# Patient Record
Sex: Female | Born: 1955
Health system: Southern US, Community
[De-identification: ages and names within clinical notes are randomized; demographics above are authoritative.]

## PROBLEM LIST (undated history)

## (undated) DIAGNOSIS — Z923 Personal history of irradiation: Secondary | ICD-10-CM

## (undated) DIAGNOSIS — R42 Dizziness and giddiness: Secondary | ICD-10-CM

## (undated) DIAGNOSIS — M1990 Unspecified osteoarthritis, unspecified site: Secondary | ICD-10-CM

## (undated) DIAGNOSIS — D649 Anemia, unspecified: Secondary | ICD-10-CM

## (undated) DIAGNOSIS — G629 Polyneuropathy, unspecified: Secondary | ICD-10-CM

## (undated) DIAGNOSIS — F419 Anxiety disorder, unspecified: Secondary | ICD-10-CM

## (undated) DIAGNOSIS — D62 Acute posthemorrhagic anemia: Secondary | ICD-10-CM

## (undated) DIAGNOSIS — Z0000 Encounter for general adult medical examination without abnormal findings: Secondary | ICD-10-CM

## (undated) DIAGNOSIS — Z8619 Personal history of other infectious and parasitic diseases: Secondary | ICD-10-CM

## (undated) DIAGNOSIS — I10 Essential (primary) hypertension: Secondary | ICD-10-CM

## (undated) DIAGNOSIS — E785 Hyperlipidemia, unspecified: Secondary | ICD-10-CM

## (undated) DIAGNOSIS — F32A Depression, unspecified: Secondary | ICD-10-CM

## (undated) DIAGNOSIS — C50919 Malignant neoplasm of unspecified site of unspecified female breast: Secondary | ICD-10-CM

## (undated) DIAGNOSIS — R739 Hyperglycemia, unspecified: Secondary | ICD-10-CM

## (undated) DIAGNOSIS — K5900 Constipation, unspecified: Secondary | ICD-10-CM

## (undated) DIAGNOSIS — F329 Major depressive disorder, single episode, unspecified: Secondary | ICD-10-CM

## (undated) DIAGNOSIS — A4902 Methicillin resistant Staphylococcus aureus infection, unspecified site: Secondary | ICD-10-CM

## (undated) HISTORY — DX: Hyperglycemia, unspecified: R73.9

## (undated) HISTORY — DX: Hyperlipidemia, unspecified: E78.5

## (undated) HISTORY — DX: Malignant neoplasm of unspecified site of unspecified female breast: C50.919

## (undated) HISTORY — DX: Essential (primary) hypertension: I10

## (undated) HISTORY — DX: Anxiety disorder, unspecified: F41.9

## (undated) HISTORY — DX: Methicillin resistant Staphylococcus aureus infection, unspecified site: A49.02

## (undated) HISTORY — PX: BREAST SURGERY: SHX581

## (undated) HISTORY — DX: Major depressive disorder, single episode, unspecified: F32.9

## (undated) HISTORY — DX: Dizziness and giddiness: R42

## (undated) HISTORY — DX: Personal history of irradiation: Z92.3

## (undated) HISTORY — DX: Acute posthemorrhagic anemia: D62

## (undated) HISTORY — DX: Constipation, unspecified: K59.00

## (undated) HISTORY — DX: Personal history of other infectious and parasitic diseases: Z86.19

## (undated) HISTORY — DX: Encounter for general adult medical examination without abnormal findings: Z00.00

---

## 1978-11-06 HISTORY — PX: APPENDECTOMY: SHX54

## 2002-06-07 ENCOUNTER — Emergency Department (HOSPITAL_COMMUNITY): Admission: EM | Admit: 2002-06-07 | Discharge: 2002-06-07 | Payer: Self-pay | Admitting: *Deleted

## 2006-04-20 ENCOUNTER — Emergency Department (HOSPITAL_COMMUNITY): Admission: EM | Admit: 2006-04-20 | Discharge: 2006-04-20 | Payer: Self-pay | Admitting: Emergency Medicine

## 2007-01-04 ENCOUNTER — Ambulatory Visit (HOSPITAL_COMMUNITY): Admission: RE | Admit: 2007-01-04 | Discharge: 2007-01-04 | Payer: Self-pay | Admitting: Gastroenterology

## 2007-01-04 ENCOUNTER — Encounter (INDEPENDENT_AMBULATORY_CARE_PROVIDER_SITE_OTHER): Payer: Self-pay | Admitting: *Deleted

## 2007-11-07 DIAGNOSIS — A4902 Methicillin resistant Staphylococcus aureus infection, unspecified site: Secondary | ICD-10-CM | POA: Insufficient documentation

## 2007-11-07 HISTORY — DX: Methicillin resistant Staphylococcus aureus infection, unspecified site: A49.02

## 2008-04-18 ENCOUNTER — Inpatient Hospital Stay (HOSPITAL_COMMUNITY): Admission: EM | Admit: 2008-04-18 | Discharge: 2008-04-20 | Payer: Self-pay | Admitting: Emergency Medicine

## 2011-03-21 NOTE — H&P (Signed)
NAME:  Kristi [name], Kristi [name] NO.:  [id number]   MEDICAL RECORD NO.:  [id number]          PATIENT TYPE:  EMS   LOCATION:  ED                           FACILITY:  [facility]   PHYSICIAN:  Michaelyn Barter, M.D. DATE OF BIRTH:  1956-06-18   DATE OF ADMISSION:  04/18/2008  DATE OF DISCHARGE:                              HISTORY & PHYSICAL   PRIMARY CARE PHYSICIAN:  [facility], therefore, she is  unassigned.   CHIEF COMPLAINT:  Right thigh wound.   HISTORY OF PRESENT ILLNESS:  [name] is a 55 year old female who  states that approximately 1 week ago she was exercising.  She developed  an itching type of sensation around the medial aspect of her right  thigh.  She scratched the area and had a progression of her symptoms.  Tuesday of this week (which is 4 days ago) the patient went to see a  primary care physician.  At that time her thigh was examined and it  appears that she may have developed an abscess which was lanced in her  primary care doctor's office.  She was started empirically on Bactrim.  Cultures were obtained from the abscess and MRSA was reported to be  positive.  She indicates that yesterday while at work she began to feel  lightheaded and this morning she developed some soreness along the right  side of her neck.  She also has developed nausea and has been  experiencing dry heaves.  She went to see her primary care physician  this morning and was subsequently told that she should come to the  hospital for evaluation.  She indicates that her leg still feels sore  and the wound is hard and the surrounding area has become red.  She  complains of low-grade fever also.   PAST MEDICAL HISTORY:  1. Significant for hypertension.  2. Anxiety.   PAST SURGICAL HISTORY:  Appendectomy.   ALLERGIES:  No known drug allergies.   HOME MEDICATIONS:  1. Benicar.  2. Lexapro.  3. Birth control.   SOCIAL HISTORY:  Cigarettes - the patient denies.  Alcohol  - the patient  denies.   FAMILY HISTORY:  Mother has diabetes mellitus type 2 and hypertension.  Father is deceased secondary to lung cancer.   REVIEW OF SYSTEMS:  As per HPI otherwise all other systems are negative.   PHYSICAL EXAMINATION:  GENERAL:  The patient is awake.  She is  cooperative.  She is in no obvious distress.  VITAL SIGNS:  Temperature is 99.0, blood pressure 118/67.  Heart rate  87, respirations 20, O2 sat 96%.  HEENT: Normocephalic, atraumatic.  Anicteric.  Pupils are equally  reactive to light.  Oral mucosa is pink.  No thrush, no exudates.  NECK:  Neck is supple.  No JVD.  No lymphadenopathy.  No tenderness to  palpation of the right aspect of the patient's neck.  No thyromegaly.  CARDIAC:  S1, S2 present.  Regular rate and rhythm.  RESPIRATORY:  No crackles or wheezes.  ABDOMEN:  Abdomen is flat, soft, nontender, nondistended.  No masses  palpated.  EXTREMITIES:  The patient's right medial thigh has a large area of  erythema with a centrally located wound which is approximately 3 cm x 2  cm in size.  There is no obvious discharge present.  However, the  patient does have some discharge on her underwear.  No distal leg edema.  NEUROLOGICAL:  The patient is alert and oriented x3.  Cranial nerves II-  XII are intact.  MUSCULOSKELETAL:  5/5 upper and lower extremity strength.   LABS:  Sodium is 137, potassium 5.1, chloride 106, glucose 93, BUN 5,  creatinine 1.2.  Rapid Strep test was negative.  WBC 5.8, hemoglobin  12.1, hematocrit 36.2, platelets 174.   ASSESSMENT AND PLAN:  1. Right thigh abscess/cellulitis secondary to MRSA (methicillin      resistant Staphylococcus aureus).  It appears that the patient has      failed outpatient therapy.  Her symptoms have progressed despite      being placed on oral antibiotics.  Will start the patient on IV      antibiotics.  Will check blood cultures and repeat cultures of the      patient's wound.  Will also provide  p.r.n. pain medications and in      light of the patient's wound enlarging since her last evaluated by      her PCP will ask the wound care nurse to take a look at the      patient's wound.  2. Nausea and vomiting.  Will provide p.r.n. antiemetics.  3. Hypertension.  The patient's blood pressure currently appears to be      stable.  Will resume her previously prescribed antihypertensive      medications.  4. Gastrointestinal prophylaxis, will provide Protonix.  5. Deep venous thrombosis prophylaxis, will provide Lovenox.      Michaelyn Barter, M.D.  Electronically Signed     OR/MEDQ  D:  04/18/2008  T:  04/18/2008  Job:  [id number]

## 2011-03-21 NOTE — Discharge Summary (Signed)
NAMELETISIA, SCHWALB NO.:  [id number]   MEDICAL RECORD NO.:  [id number]          PATIENT TYPE:  INP   LOCATION:  1501                         FACILITY:  [facility]   PHYSICIAN:  Michaelyn Barter, M.D. DATE OF BIRTH:  02-Oct-1956   DATE OF ADMISSION:  04/18/2008  DATE OF DISCHARGE:  04/20/2008                               DISCHARGE SUMMARY   FINAL DIAGNOSIS:  Right thigh cellulitis/abscess associated with  methicillin-resistant Staphylococcus aureus.   CONSULTATIONS:  The wound care nurse.   PROCEDURES.:  Excisional sharp debridement of right thigh wound.   HISTORY OF PRESENT ILLNESS:  Ms. Romey is a 55 year old female who  stated that approximately a week prior to this admission she was  exercising.  She developed some itching around the medial aspect of her  right thigh.  She scratched the area and her symptoms progressed.  Four  days prior to this admission she went see her primary care doctor.  It  appeared that the patient had developed an abscess which was lanced in  her primary care doctor's office.  She was started empirically on  Bactrim.  Cultures were obtained from the discharge and later found to  be positive for MRSA.  The day prior to this admission the patient began  to feel lightheaded, and on the morning of admission she developed some  soreness along the right side of her neck.  She also complained of  nausea as well as dry heaving.  She went to see her PCP for follow-up  evaluation and was subsequently referred to the hospital for evaluation.   For past medical history, please see that dictated by Dr. Michaelyn Barter.   HOSPITAL COURSE:  Right medial thigh abscess/cellulitis.  IV vancomycin  was initiated and blood cultures were ordered and drawn x2 on June 13.  Both sets were negative.  A wound culture was obtained on June 13 also  and grew abundant gram-positive cocci in pairs and clusters.  The  patient's white count was normal at the  time of admission.  She remained  afebrile throughout her hospital course.  On June 15 the wound care  nurse saw the patient.  She indicated that the patient's right thigh had  full-thickness wound which was 100% sloughed off.  She performed an  excisional sharp debridement using scissors and forceps to remove  nonviable tissue on the inner wound bed which appeared to be red with a  wound that measured approximately 1.5 x 1.5 x 0.6 cm.  Erythema and  edema surrounded the wound.  It was recommended that the patient bathe  or shower daily to encourage further drainage, as well as apply gauze to  absorb any current drainage.  Over the course of her hospitalization the  patient indicated that she felt better.  Hemodynamically she remained  stable.  On the date of discharge the patient indicated that there were  no new developments.  Her temperature was 97.8, heart rate 66,  respirations 16, blood pressure 116/76, O2 saturation 99% on room air.  The patient indicated that she was ready to be  discharged.  She was  discharged home as she requested.  Her medications consisted of  doxycycline 100 mg p.o. q.12 h.  She was given a prescription for 10  days of doxycycline.  She was told to call her primary care doctor  within 2 weeks for follow-up appointment.     Michaelyn Barter, M.D.  Electronically Signed    OR/MEDQ  D:  04/20/2008  T:  04/20/2008  Job:  [id number]

## 2011-03-24 NOTE — Op Note (Signed)
NAMEMERRIDY, PASCOE NO.:  [id number]   MEDICAL RECORD NO.:  [id number]          PATIENT TYPE:  AMB   LOCATION:  ENDO                         FACILITY:  [facility]   PHYSICIAN:  Graylin Shiver, M.D.   DATE OF BIRTH:  [birth year]   DATE OF PROCEDURE:  01/04/2007  DATE OF DISCHARGE:                               OPERATIVE REPORT   INDICATIONS FOR PROCEDURE:  The patient had a prior colonoscopy in  September of 2007 with findings of a cecal polyp which was a sessile  serrated adenoma.  It was an area of granular-appearing mucosa in the  cecum which I did not appreciate as being very large, but I felt that  repeating the colonoscopy would be indicated to see if it could be  removed completely and perhaps use the argon-plasma coagulator.   Informed consent was obtained after explanation of the risks of  bleeding, infection and perforation.   PREMEDICATION:  Fentanyl 70 mcg IV, Versed 7 mg IV.   PROCEDURE:  With the patient in the left lateral decubitus position, a  rectal exam was performed.  No masses were felt.  The Pentax colonoscope  was inserted into the rectum and advanced around the colon to the cecum.  Cecal landmarks were identified.  At the base of the cecum, there were  ridges of granular mucosa.  Biopsies were taken.  This area looked to be  about 1.5 cm in size which was larger than what I had thought before.  It was a flat type of nodular appearance, and I could not definitely  tell if this was polyp tissue or not.  I saw no other areas of  abnormality.  Biopsies were taken and will be checked.  The rest of the  colon looked normal.  She tolerated the procedure well without  complications.   IMPRESSION:  Granular-appearing ridges of mucosa at the base of the  cecum.  Biopsies were obtained and will be reviewed.  Further decisions  will be made.           ______________________________  Graylin Shiver, M.D.     SFG/MEDQ  D:  01/04/2007  T:   01/04/2007  Job:  [id number]   cc:   Jeri Cos, M.D.

## 2011-04-07 HISTORY — PX: HEMORRHOID SURGERY: SHX153

## 2011-04-07 HISTORY — PX: ANAL SPHINCTEROTOMY: SHX1140

## 2011-04-10 ENCOUNTER — Ambulatory Visit (HOSPITAL_BASED_OUTPATIENT_CLINIC_OR_DEPARTMENT_OTHER)
Admission: RE | Admit: 2011-04-10 | Discharge: 2011-04-10 | Disposition: A | Discharge status: Home / Self Care | Payer: BC Managed Care – PPO | Source: Ambulatory Visit | Attending: Surgery | Admitting: Surgery

## 2011-04-10 DIAGNOSIS — Z01810 Encounter for preprocedural cardiovascular examination: Secondary | ICD-10-CM | POA: Insufficient documentation

## 2011-04-10 DIAGNOSIS — Z01812 Encounter for preprocedural laboratory examination: Secondary | ICD-10-CM | POA: Insufficient documentation

## 2011-04-10 DIAGNOSIS — K648 Other hemorrhoids: Secondary | ICD-10-CM | POA: Insufficient documentation

## 2011-04-10 DIAGNOSIS — K602 Anal fissure, unspecified: Secondary | ICD-10-CM | POA: Insufficient documentation

## 2011-04-10 LAB — POCT I-STAT 4, (NA,K, GLUC, HGB,HCT)
Glucose, Bld: 91 mg/dL (ref 70–99)
HCT: 40 % (ref 36.0–46.0)
Hemoglobin: 13.6 g/dL (ref 12.0–15.0)

## 2011-04-10 LAB — POCT PREGNANCY, URINE: Preg Test, Ur: NEGATIVE

## 2011-04-18 NOTE — Op Note (Signed)
NAMEROBIN, PETRAKIS NO.:  [id number]  MEDICAL RECORD NO.:  [id number]  LOCATION:                                 FACILITY:  PHYSICIAN:  Kristi Sportsman, MD     DATE OF BIRTH:  [birth year]  DATE OF PROCEDURE:  04/10/2011 DATE OF DISCHARGE:                              OPERATIVE REPORT   PRIMARY CARE PHYSICIAN:  Kristi Nations, MD, with [facility].  GASTROENTEROLOGIST:  Kristi Shiver, MD, with [facility].  SURGEON:  Kristi Sportsman, MD  ASSISTANT:  RN.  PREOPERATIVE DIAGNOSES: 1. Posterior midline anal fissure. 2. Right anterior prolapsing hemorrhoid.  POSTOPERATIVE DIAGNOSES: 1. Posterior midline anal fissure. 2. Right anterior prolapsing hemorrhoid.  PROCEDURE PERFORMED: 1. Examination under anesthesia. 2. Right lateral internal sphincterotomy (75%). 3. Right anterior hemorrhoidal ligation.  ANESTHESIA: 1. General anesthesia. 2. Anorectal field block using bupivacaine.  SPECIMENS:  None.  DRAINS:  None.  ESTIMATED BLOOD LOSS:  Minimal.  COMPLICATIONS:  None apparent.  INDICATIONS:  Kristi [name] is a 55 year old female with no history of prior anorectal problems who began to have severe anal pain and dyschezia.  She had a colonoscopy last year that was otherwise normal. She was started on diltiazem cream and has been on that for almost 3 weeks without significant palliation of symptoms.  It was felt that she had stenosis on outside examination, but on my examination I think it was just a tight sphincter.  Anatomy and physiology of the anorectal mechanism and digestive tract were discussed.  Options discussed, recommendation made for examination under anesthesia with possible sphincterotomy.  Possible need of hemorrhoidal ligation or other interventions depending on diagnosis was discussed as well.  Risks, benefits, and alternatives were discussed. Questions answered, she and her family agreed to  proceed.  OPERATIVE FINDINGS:  She had a rather tight, contracted internal sphincter.  She did not have any true mucosal stricturing.  She had a classic posterior midline fissure without a sentinel tag.  In the right anterior, she had a moderate-sized hemorrhoid with some chronic mucosal prolapse as well.  The rest of her hemorrhoidal piles were unremarkable.  There were no other masses or other areas of concern.  There was no evidence of any fissures or fistulae.  DESCRIPTION OF PROCEDURE:  Informed consent was confirmed.  The patient underwent deep sedation with laryngeal mask airway and placed under a high lithotomy position for airway purposes.  Her perineum was prepped and draped in a sterile fashion.  Surgical time-out confirmed our plan. I placed a perianal block.  I noted the obvious posterior midline fissure that I had seen in the hospital.  Her internal sphincter was tight, but there was no obvious mucosal stricturing.  I gently dilated up to place a medium-sized anoscope in there.  I did circumferential inspection.  I felt nor saw any abnormal rectal masses.  Her right anterior hemorrhoid was moderately enlarged with some mucosal prolapse with it as well.  Based on the fact that she had abnormal hemorrhoid on the right side and an obvious fissure, I decided to do a lateral sphincterotomy on the right.  I opened up the  anoderm longitudinally and easily isolated the internal sphincter, did dissection to bluntly free off the internal sphincter.  Using cautery, I did a right lateral sphincterotomy to about 75% of the sphincter as that was about the apex of the fissure itself. With that, she had a sphincterotome, but it was much easier, her anal sphincter had relaxed well without being totally loose.  With that, I was able to reapproximate the mucosa and also ligate the right anterior hemorrhoidal pile with that using some running and interrupted 3-0 Vicryl stitches to good  result.  With that closed, she was more relaxed than preoperatively, but with a decent tone.  I did normal touch cautery on the exposed fissure mucosa, avoiding any severe contracturing.  I did reinspection, hemostasis was excellent.  The patient was extubated and taken to the recovery room in stable condition.  I discussed postoperative care in my office as well as in the holding area with family and I am about to discuss with them again as well.     Kristi Sportsman, MD     SCG/MEDQ  D:  04/10/2011  T:  04/11/2011  Job:  [id number]  cc:   Kristi Nations, MD Fax: [phone]  Kristi [name], M.D. Fax: [phone]  Electronically Signed by Karie Soda MD on 04/18/2011 04:05:13 PM

## 2011-05-03 ENCOUNTER — Ambulatory Visit (INDEPENDENT_AMBULATORY_CARE_PROVIDER_SITE_OTHER): Payer: BC Managed Care – PPO | Admitting: Surgery

## 2011-05-03 ENCOUNTER — Encounter (INDEPENDENT_AMBULATORY_CARE_PROVIDER_SITE_OTHER): Payer: Self-pay | Admitting: Surgery

## 2011-05-03 DIAGNOSIS — K602 Anal fissure, unspecified: Secondary | ICD-10-CM

## 2011-05-03 DIAGNOSIS — K648 Other hemorrhoids: Secondary | ICD-10-CM | POA: Insufficient documentation

## 2011-05-03 HISTORY — DX: Other hemorrhoids: K64.8

## 2011-05-03 HISTORY — DX: Anal fissure, unspecified: K60.2

## 2011-05-03 NOTE — Progress Notes (Signed)
Subjective:     Patient ID: Kristi [name], female   DOB: June 12, 1956, 55 y.o.   MRN: [id number]    There were no vitals taken for this visit.    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  Reason for visit followup  Patient comes today feeling better. Her pain is markedly going down. There she denies any bleeding. She denies any stitches exposed. She's had a bowel movement once a 2 times a day. She is feeling much better. She is as wiped with witch hazel and occasional Vaseline around the perianal region.  Review of Systems  Gastrointestinal: Positive for rectal pain. Negative for nausea, vomiting, abdominal pain, diarrhea, constipation, blood in stool, abdominal distention and anal bleeding.       Very mild rectal pain - much improved  All other systems reviewed and are negative.       Objective:   Physical Exam  Constitutional: She appears well-developed and well-nourished. No distress.  HENT:  Head: Normocephalic and atraumatic.  Eyes: EOM are normal. Pupils are equal, round, and reactive to light. Right eye exhibits no discharge.  Neck: Normal range of motion. Neck supple.  Cardiovascular: Normal rate and regular rhythm.   Pulmonary/Chest: Effort normal. No respiratory distress.  Abdominal: Soft. She exhibits no distension. There is no tenderness. There is no rebound. Hernia confirmed negative in the right inguinal area and confirmed negative in the left inguinal area.  Genitourinary: Vagina normal. Rectal exam shows external hemorrhoid. Rectal exam shows no mass and anal tone normal. There is no rash on the right labia. There is no rash on the left labia. No vaginal discharge found.       Small prolaspe of R ant hem - decreased post-op  NST.  No abscess/cellulitis.  Fissure nearly healed  Lymphadenopathy:       Right: No inguinal adenopathy present.        Left: No inguinal adenopathy present.  Skin: She is not diaphoretic.       Assessment:     Anal fissure nearly healed.  Right prolapsing anterior hemorrhoid. Markedly decreased    Plan:     Continue bowel regimen  Continue wet wipes and perianal hygiene as tolerated.  Return to clinic p.r.n. IAt this point she's more than 3 weeks out and risks of further problems are are low. She expressed appreciation for our efforts

## 2011-08-03 LAB — CULTURE, BLOOD (ROUTINE X 2): Culture: NO GROWTH

## 2011-08-03 LAB — WOUND CULTURE

## 2011-08-03 LAB — POCT I-STAT, CHEM 8
Chloride: 106
HCT: 36
Hemoglobin: 12.2
Potassium: 5.1

## 2011-08-03 LAB — CBC
HCT: 36.2
MCHC: 33.7
MCV: 85.9
MCV: 85.9
Platelets: 158
Platelets: 174
RBC: 3.83 — ABNORMAL LOW
RDW: 13.2

## 2011-08-03 LAB — DIFFERENTIAL
Basophils Absolute: 0
Eosinophils Absolute: 0.3
Eosinophils Relative: 5

## 2011-08-03 LAB — BASIC METABOLIC PANEL
BUN: 6
CO2: 22
Calcium: 8.3 — ABNORMAL LOW
Creatinine, Ser: 0.86
GFR calc Af Amer: >60

## 2011-08-03 LAB — RAPID STREP SCREEN (MED CTR MEBANE ONLY): Streptococcus, Group A Screen (Direct): NEGATIVE

## 2012-04-03 ENCOUNTER — Other Ambulatory Visit: Payer: Self-pay | Admitting: Obstetrics

## 2013-01-21 ENCOUNTER — Other Ambulatory Visit: Payer: Self-pay | Admitting: Radiology

## 2013-01-23 ENCOUNTER — Other Ambulatory Visit: Payer: Self-pay | Admitting: Radiology

## 2013-01-23 DIAGNOSIS — C50912 Malignant neoplasm of unspecified site of left female breast: Secondary | ICD-10-CM

## 2013-01-24 ENCOUNTER — Telehealth: Payer: Self-pay | Admitting: *Deleted

## 2013-01-24 DIAGNOSIS — C50212 Malignant neoplasm of upper-inner quadrant of left female breast: Secondary | ICD-10-CM

## 2013-01-24 DIAGNOSIS — C50219 Malignant neoplasm of upper-inner quadrant of unspecified female breast: Secondary | ICD-10-CM | POA: Insufficient documentation

## 2013-01-24 HISTORY — DX: Malignant neoplasm of upper-inner quadrant of unspecified female breast: C50.219

## 2013-01-24 NOTE — Telephone Encounter (Signed)
Confirmed [facility] for 01/29/13 at 1200 .  Instructions and contact information given. 

## 2013-01-29 ENCOUNTER — Telehealth: Payer: Self-pay | Admitting: *Deleted

## 2013-01-29 ENCOUNTER — Ambulatory Visit: Payer: BC Managed Care – PPO | Attending: General Surgery | Admitting: Physical Therapy

## 2013-01-29 ENCOUNTER — Encounter: Payer: Self-pay | Admitting: Radiation Oncology

## 2013-01-29 ENCOUNTER — Encounter: Payer: Self-pay | Admitting: *Deleted

## 2013-01-29 ENCOUNTER — Other Ambulatory Visit (HOSPITAL_BASED_OUTPATIENT_CLINIC_OR_DEPARTMENT_OTHER): Payer: BC Managed Care – PPO | Admitting: Lab

## 2013-01-29 ENCOUNTER — Ambulatory Visit (HOSPITAL_BASED_OUTPATIENT_CLINIC_OR_DEPARTMENT_OTHER): Payer: BC Managed Care – PPO

## 2013-01-29 ENCOUNTER — Ambulatory Visit
Admission: RE | Admit: 2013-01-29 | Discharge: 2013-01-29 | Disposition: A | Discharge status: Home / Self Care | Payer: BC Managed Care – PPO | Source: Ambulatory Visit | Attending: Radiation Oncology | Admitting: Radiation Oncology

## 2013-01-29 ENCOUNTER — Ambulatory Visit (HOSPITAL_BASED_OUTPATIENT_CLINIC_OR_DEPARTMENT_OTHER): Payer: BC Managed Care – PPO | Admitting: Oncology

## 2013-01-29 ENCOUNTER — Encounter: Payer: Self-pay | Admitting: Oncology

## 2013-01-29 ENCOUNTER — Ambulatory Visit (HOSPITAL_BASED_OUTPATIENT_CLINIC_OR_DEPARTMENT_OTHER): Payer: BC Managed Care – PPO | Admitting: General Surgery

## 2013-01-29 VITALS — BP 117/73 | HR 71 | Temp 98.6°F | Resp 20 | Ht 63.0 in | Wt 164.5 lb

## 2013-01-29 DIAGNOSIS — C50219 Malignant neoplasm of upper-inner quadrant of unspecified female breast: Secondary | ICD-10-CM

## 2013-01-29 DIAGNOSIS — C50212 Malignant neoplasm of upper-inner quadrant of left female breast: Secondary | ICD-10-CM

## 2013-01-29 DIAGNOSIS — IMO0001 Reserved for inherently not codable concepts without codable children: Secondary | ICD-10-CM | POA: Insufficient documentation

## 2013-01-29 DIAGNOSIS — C50919 Malignant neoplasm of unspecified site of unspecified female breast: Secondary | ICD-10-CM | POA: Insufficient documentation

## 2013-01-29 DIAGNOSIS — C50912 Malignant neoplasm of unspecified site of left female breast: Secondary | ICD-10-CM

## 2013-01-29 DIAGNOSIS — Z171 Estrogen receptor negative status [ER-]: Secondary | ICD-10-CM

## 2013-01-29 DIAGNOSIS — M25619 Stiffness of unspecified shoulder, not elsewhere classified: Secondary | ICD-10-CM | POA: Insufficient documentation

## 2013-01-29 DIAGNOSIS — C773 Secondary and unspecified malignant neoplasm of axilla and upper limb lymph nodes: Secondary | ICD-10-CM

## 2013-01-29 LAB — CBC WITH DIFFERENTIAL/PLATELET
BASO%: 0.2 % (ref 0.0–2.0)
EOS%: 1.2 % (ref 0.0–7.0)
HCT: 35.3 % (ref 34.8–46.6)
MCH: 28.7 pg (ref 25.1–34.0)
MCHC: 33.4 g/dL (ref 31.5–36.0)
MONO#: 0.4 10*3/uL (ref 0.1–0.9)
NEUT%: 52.6 % (ref 38.4–76.8)
RBC: 4.1 10*6/uL (ref 3.70–5.45)
RDW: 13.9 % (ref 11.2–14.5)
WBC: 6.4 10*3/uL (ref 3.9–10.3)
lymph#: 2.5 10*3/uL (ref 0.9–3.3)

## 2013-01-29 LAB — COMPREHENSIVE METABOLIC PANEL (CC13)
ALT: 22 U/L (ref 0–55)
AST: 20 U/L (ref 5–34)
CO2: 28 mEq/L (ref 22–29)
Calcium: 9.3 mg/dL (ref 8.4–10.4)
Chloride: 103 mEq/L (ref 98–107)
Creatinine: 0.9 mg/dL (ref 0.6–1.1)
Potassium: 3.3 mEq/L — ABNORMAL LOW (ref 3.5–5.1)
Sodium: 138 mEq/L (ref 136–145)
Total Protein: 7.1 g/dL (ref 6.4–8.3)

## 2013-01-29 NOTE — Progress Notes (Signed)
Kristi [name] [id number] [birth year] 57 y.o. 01/29/2013 2:31 PM  CC  Ivory Broad Joylene John, MD [facility] [address] [place] [address] Dr. Everardo Beals Dr. Antony Blackbird  REASON FOR CONSULTATION: patient is seen in the multidisciplinary breast clinic for discussion of treatment options for new diagnosis of left breast cancer.  STAGE:   Cancer of upper-inner quadrant of female breast   Primary site: Breast (Left)   Staging method: AJCC 7th Edition   Clinical: Stage IA (T1c, N0, cM0)   Summary: Stage IA (T1c, N0, cM0)  REFERRING PHYSICIAN: Dr. Everardo Beals  HISTORY OF PRESENT ILLNESS:  Kristi [name] is a 57 y.o. female.  In the multidisciplinary breast clinic with new diagnosis of breast cancer. Earlier this year it on routine screening mammography the patient was noted to have a abnormality within the 12:00 position of the left breast. Patient returned for additional imaging with 3-D, tomosynthesis. Within the left breast there were 2 masses somewhat lobulated with irregular margins in the upper inner aspect of the left breast. These were separated by approximately a centimeter. On ultrasound the lesion the 11:00 position measured 1.0 x 0.7 cm. The 12:00 lesion measured 1.6 x 0.9 cm. In addition within the axillary region there was a 2.1 x 1.7 suspicious appearing lymph node.. The patient proceeded to undergo ultrasound-guided biopsy of the 2 areas within the left breast as well as the suspicious left axillary lymph node. Biopsies from both breast areas revealed invasive ductal carcinoma. In addition the lymph node from the left axilla revealed ductal carcinoma. The lesions appeared to be high-grade. There was no HER-2/neu amplification and hormone staining was minimal consistent with essentially consistent with triple negative breast cancer.patient's case was discussed at the multidisciplinary breast conference her pathology and radiology were  reviewed.   Past Medical History: Past Medical History  Diagnosis Date  . Anxiety   . GERD (gastroesophageal reflux disease)   . Hypertension   . Breast cancer     Past Surgical History: Past Surgical History  Procedure Laterality Date  . Appendectomy    . Anal sphincterotomy  04/2011  . Hemorrhoid surgery  04/2011    ligation    Family History: Family History  Problem Relation Age of Onset  . Cancer Father   . Lung cancer Father   . Breast cancer Paternal Aunt   . Colon cancer Paternal Aunt   . Cervical cancer Paternal Aunt   . Ovarian cancer Cousin   . Breast cancer Cousin   . Breast cancer Cousin     Social History History  Substance Use Topics  . Smoking status: Never Smoker   . Smokeless tobacco: Never Used  . Alcohol Use: No    Allergies: No Known Allergies  Current Medications: Current Outpatient Prescriptions  Medication Sig Dispense Refill  . ALPRAZolam (XANAX) 1 MG tablet Take 1 mg by mouth at bedtime as needed.        Marland Kitchen amphetamine-dextroamphetamine (ADDERALL) 10 MG tablet Take 20 mg by mouth daily.       . cholecalciferol (VITAMIN D) 1000 UNITS tablet Take 2,000 Units by mouth daily. With vitamin E 400 iu together.      . escitalopram (LEXAPRO) 20 MG tablet Take 20 mg by mouth daily.        . valsartan-hydrochlorothiazide (DIOVAN-HCT) 320-25 MG per tablet Take 1 tablet by mouth daily.       . vitamin B-12 (CYANOCOBALAMIN) 1000 MCG tablet Take 1,000 mcg  by mouth daily.      Marland Kitchen esomeprazole (NEXIUM) 40 MG capsule Take 40 mg by mouth daily before breakfast.        . Multiple Vitamins-Minerals (MULTIVITAMIN WITH MINERALS) tablet Take 1 tablet by mouth daily.        . norethindrone-ethinyl estradiol (NORTREL 7/7/7) 0.5/0.75/1-35 MG-MCG per tablet Take 1 tablet by mouth daily.        . valsartan (DIOVAN) 320 MG tablet Take 320 mg by mouth daily.         No current facility-administered medications for this visit.    OB/GYN History:menarche at age 67  she is postmenopausal she has been on hormone replacement therapy first live birth was at 44.  Fertility Discussion: n/a Prior History of Cancer: none  Health Maintenance:  Colonoscopy yes  Bone Density no Last PAP smear up todate  ECOG PERFORMANCE STATUS: 0 - Asymptomatic  Genetic Counseling/testing: will be referred  REVIEW OF SYSTEMS:  A comprehensive review of systems was negative.  PHYSICAL EXAMINATION: Blood pressure 117/73, pulse 71, temperature 98.6 F (37 C), temperature source Oral, resp. rate 20, height 5\' 3"  (1.6 m), weight 164 lb 8 oz (74.617 kg). Patient is awake alert in no acute distress. HEENT exam EOMI PERRLA sclerae anicteric no conjunctival pallor oral mucosa is moist neck is supple lungs are clear to auscultation and percussion cardiovascular is regular rate rhythm no murmurs gallops or rubs abdomen is soft no hepatosplenomegaly extremities no edema left axilla reveals a firm node within the axillary region measuring about 2 cm. Left breast reveals 2 separate areas consistent with tumor or bruising or hematoma from her recent biopsies no nipple discharge or bleeding. Neuro is nonfocal per     STUDIES/RESULTS: No results found.   LABS:    Chemistry      Component Value Date/Time   NA 138 01/29/2013 1222   NA 140 04/10/2011 0653   K 3.3* 01/29/2013 1222   K 3.4* 04/10/2011 0653   CL 103 01/29/2013 1222   CL 110 04/19/2008 0458   CO2 28 01/29/2013 1222   CO2 22 04/19/2008 0458   BUN 11.4 01/29/2013 1222   BUN 6 04/19/2008 0458   CREATININE 0.9 01/29/2013 1222   CREATININE 0.86 04/19/2008 0458      Component Value Date/Time   CALCIUM 9.3 01/29/2013 1222   CALCIUM 8.3* 04/19/2008 0458   ALKPHOS 49 01/29/2013 1222   AST 20 01/29/2013 1222   ALT 22 01/29/2013 1222   BILITOT 0.28 01/29/2013 1222      Lab Results  Component Value Date   WBC 6.4 01/29/2013   HGB 11.8 01/29/2013   HCT 35.3 01/29/2013   MCV 86.1 01/29/2013   PLT 160 01/29/2013    ASSESSMENT     57 year old female with new diagnosis of  #1 multifocal invasive ductal carcinoma grade 3 ER weakly positive at 3% PR negative HER-2/neu negative with Ki-67 of 90% tumor is very aggressive. She does have a palpable lymph node as well in the left axilla. Patient is seen in the multidisciplinary clinic for discussion of treatment options.  #2 patient is recommended a lumpectomy with axillary lymph node dissection. However she does need a MRI of the breasts. To further evaluate the breast and the axillary lymph nodes prior to her surgery.  #3 I have recommended adjuvant chemotherapy consisting of Adriamycin Cytoxan given dose dense for total of 4 cycles followed by Taxol carboplatinum weekly for 12 weeks. Rationale for this aggressive treatment was discussed  with the patient since she is essentially has a triple-negative disease.  #4 certainly patient will need post lumpectomy radiation therapy she was seen by radiation oncology. This would begin after patient has completed her chemotherapy.  #5 we discussed role of genetic since she will be referred to genetic so as well.     PLAN:    #1 patient will proceed with lumpectomy axillary lymph node dissection as well as her MRI. She will need a Port-A-Cath placement.  #2 in preparation for chemotherapy she will need echocardiogram as well as Port-A-Cath placement chemotherapy class.  #3 I will see her back in 3-4 weeks' time for followup.        Thank you so much for allowing me to participate in the care of Kristi [name]. I will continue to follow up the patient with you and assist in her care.  All questions were answered. The patient knows to call the clinic with any problems, questions or concerns. We can certainly see the patient much sooner if necessary.  I spent 60 minutes counseling the patient face to face. The total time spent in the appointment was 60 minutes.   Drue Second, MD Medical/Oncology [facility] [phone] (beeper) [phone] (Office)  01/29/2013, 2:31 PM

## 2013-01-29 NOTE — Progress Notes (Signed)
Chief complaint:  Left breast cancer  HISTORY: Pt is a 57 yo F with screening detected ill defined left breast mass and enlarged LN.  She has not been able to feel mass.  She has a family history of paternal aunt with breast cancer , 2 maternal cousins with breast cancer, and 1 maternal cousin with ovarian cancer.  She has one child.  She had menarche at age 36.  She is still having periods.  She has used hormone contraception for decades.  She is very anxious.  She had biopsy with hormone positive breast cancer.    Past Medical History  Diagnosis Date  . Anxiety   . GERD (gastroesophageal reflux disease)   . Hypertension   . Breast cancer     Past Surgical History  Procedure Laterality Date  . Appendectomy    . Anal sphincterotomy  04/2011  . Hemorrhoid surgery  04/2011    ligation    Current Outpatient Prescriptions  Medication Sig Dispense Refill  . ALPRAZolam (XANAX) 1 MG tablet Take 1 mg by mouth at bedtime as needed.        Marland Kitchen amphetamine-dextroamphetamine (ADDERALL) 10 MG tablet Take 20 mg by mouth daily.       . cholecalciferol (VITAMIN D) 1000 UNITS tablet Take 2,000 Units by mouth daily. With vitamin E 400 iu together.      . escitalopram (LEXAPRO) 20 MG tablet Take 20 mg by mouth daily.        Marland Kitchen esomeprazole (NEXIUM) 40 MG capsule Take 40 mg by mouth daily before breakfast.        . Multiple Vitamins-Minerals (MULTIVITAMIN WITH MINERALS) tablet Take 1 tablet by mouth daily.        . norethindrone-ethinyl estradiol (NORTREL 7/7/7) 0.5/0.75/1-35 MG-MCG per tablet Take 1 tablet by mouth daily.        . valsartan (DIOVAN) 320 MG tablet Take 320 mg by mouth daily.        . valsartan-hydrochlorothiazide (DIOVAN-HCT) 320-25 MG per tablet Take 1 tablet by mouth daily.       . vitamin B-12 (CYANOCOBALAMIN) 1000 MCG tablet Take 1,000 mcg by mouth daily.       No current facility-administered medications for this visit.     No Known Allergies   Family History  Problem Relation  Age of Onset  . Cancer Father   . Lung cancer Father   . Breast cancer Paternal Aunt   . Colon cancer Paternal Aunt   . Cervical cancer Paternal Aunt   . Ovarian cancer Cousin   . Breast cancer Cousin   . Breast cancer Cousin      History   Social History  . Marital Status: Married    Spouse Name: N/A    Number of Children: N/A  . Years of Education: N/A   Social History Main Topics  . Smoking status: Never Smoker   . Smokeless tobacco: Never Used  . Alcohol Use: No  . Drug Use: No  . Sexually Active: Yes     REVIEW OF SYSTEMS - PERTINENT POSITIVES ONLY: 12 point review of systems negative other than HPI and PMH except for anxiety  EXAM: Wt Readings from Last 3 Encounters:  01/29/13 164 lb 8 oz (74.617 kg)   Temp Readings from Last 3 Encounters:  01/29/13 98.6 F (37 C) Oral   BP Readings from Last 3 Encounters:  01/29/13 117/73   Pulse Readings from Last 3 Encounters:  01/29/13 71    Gen:  No acute distress.  Well nourished and well groomed.   Neurological: Alert and oriented to person, place, and time. Coordination normal.  Head: Normocephalic and atraumatic.  Eyes: Conjunctivae are normal. Pupils are equal, round, and reactive to light. No scleral icterus.  Neck: Normal range of motion. Neck supple. No tracheal deviation or thyromegaly present.  Cardiovascular: Normal rate, regular rhythm, normal heart sounds and intact distal pulses.  Exam reveals no gallop and no friction rub.  No murmur heard. Respiratory: Effort normal.  No respiratory distress. No chest wall tenderness. Breath sounds normal.  No wheezes, rales or rhonchi.  Breast:  Pt has ptotic breasts with left side larger.  There are no palpable masses.  There is a palpable 2 cm lymph node in the left axilla.  There is no nipple retraction or skin dimpling.   GI: Soft. Bowel sounds are normal. The abdomen is soft and nontender.  There is no rebound and no guarding.  Musculoskeletal: Normal range of  motion. Extremities are nontender.  Lymphadenopathy: No cervical, preauricular, postauricular or axillary adenopathy is present Skin: Skin is warm and dry. No rash noted. No diaphoresis. No erythema. No pallor. No clubbing, cyanosis, or edema.   Psychiatric: Normal mood and affect. Behavior is normal. Judgment and thought content normal.    LABORATORY RESULTS: Available labs are reviewed  CBC, K 3.3, Alb 3.3  RADIOLOGY RESULTS: See E-Chart or I-Site for most recent results.  Images and reports are reviewed. 1cm mass at 11 o'clock, 1.6 cm mass at 12 o'clock.  MR pending.     ASSESSMENT AND PLAN: Cancer of upper-inner quadrant of female breast Pt with cT1cN1Mx left breast cancer. This is essentially a triple negative breast cancer with elevated Ki67%.  Her MRI is pending.   We will tentatively plan left needle localized lumpectomy, axillary lymph node dissection, and port a cath placement.   If she has a much larger tumor than expected, I would just do port.  If she appears to have multicentric disease, she would need mastectomy.  Either way, she will stay overnight in the hospital.   The surgical procedure was described to the patient.  I discussed the incision type and location and that we would need radiology involved on the day of surgery with a wire marker.      The risks and benefits of the procedure were described to the patient and she wishes to proceed.    We discussed the risks bleeding, infection, damage to other structures, need for further procedures/surgeries.  We discussed the risk of seroma.  The patient was advised if the area in the breast in cancer, we may need to go back to surgery for additional tissue to obtain negative margins or for a lymph node biopsy. The patient was advised that these are the most common complications, but that others can occur as well.  They were advised against taking aspirin or other anti-inflammatory agents/blood thinners the week before  surgery.   She was advised that she will need to take some time off work.    30 minutes were spent in counseling with the pt.       Maudry Diego MD Surgical Oncology, General and Endocrine Surgery [facility]      Visit Diagnoses: 1. Breast cancer, left   2. Cancer of upper-inner quadrant of female breast, left     Primary Care Physician: Garth Schlatter, MD

## 2013-01-29 NOTE — Telephone Encounter (Signed)
appt made and printed..made pt aware that we will call her with her echo appt after her precert is approved.

## 2013-01-29 NOTE — Assessment & Plan Note (Signed)
Pt with cT1cN1Mx left breast cancer. This is essentially a triple negative breast cancer with elevated Ki67%.  Her MRI is pending.   We will tentatively plan left needle localized lumpectomy, axillary lymph node dissection, and port a cath placement.   If she has a much larger tumor than expected, I would just do port.  If she appears to have multicentric disease, she would need mastectomy.  Either way, she will stay overnight in the hospital.   The surgical procedure was described to the patient.  I discussed the incision type and location and that we would need radiology involved on the day of surgery with a wire marker.      The risks and benefits of the procedure were described to the patient and she wishes to proceed.    We discussed the risks bleeding, infection, damage to other structures, need for further procedures/surgeries.  We discussed the risk of seroma.  The patient was advised if the area in the breast in cancer, we may need to go back to surgery for additional tissue to obtain negative margins or for a lymph node biopsy. The patient was advised that these are the most common complications, but that others can occur as well.  They were advised against taking aspirin or other anti-inflammatory agents/blood thinners the week before surgery.   She was advised that she will need to take some time off work.    30 minutes were spent in counseling with the pt.

## 2013-01-29 NOTE — Progress Notes (Signed)
Radiation Oncology         [phone] ________________________________  Initial outpatient Consultation  Name: Kristi [name] MRN: [id number]  Date: 01/29/2013  DOB: [birth year]  VH:QIONG, Joylene John, MD  Almond Lint, MD   REFERRING PHYSICIAN: Almond Lint, MD  DIAGNOSIS: Clinical stage II multifocal high-grade invasive ductal carcinoma of the left breast  HISTORY OF PRESENT ILLNESS::Kristi [name] is a 57 y.o. female who is who is seen out courtesy of Dr. Donell Beers as part of the multidisciplinary breast clinic. Earlier this year it on routine screening mammography the patient was noted to have a abnormality within the 12:00 position of the left breast. Patient returned for additional imaging with 3-D, tomosynthesis. Within the left breast there were 2 masses somewhat lobulated with irregular margins in the upper inner aspect of the left breast. These were separated by approximately a centimeter. On ultrasound the lesion  the 11:00 position measured 1.0 x 0.7 cm. The 12:00 lesion measured 1.6 x 0.9 cm. In addition within the axillary region there was a 2.1 x 1.7 suspicious appearing lymph node.. The patient proceeded to undergo ultrasound-guided biopsy of the 2 areas within the left breast as well as the suspicious left axillary lymph node. Biopsies from both breast areas revealed invasive ductal carcinoma. In addition the lymph node from the left axilla revealed ductal carcinoma. The lesions appeared to be high-grade. There was no HER-2/neu amplification and hormone staining was minimal consistent with essentially consistent with triple negative breast cancer. With these findings the patient is now seen for further evaluation.   PREVIOUS RADIATION THERAPY: No  PAST MEDICAL HISTORY:  has a past medical history of Anxiety; GERD (gastroesophageal reflux disease); Hypertension; and Breast cancer.    PAST SURGICAL HISTORY: Past Surgical History  Procedure Laterality Date  . Appendectomy      . Anal sphincterotomy  04/2011  . Hemorrhoid surgery  04/2011    ligation    FAMILY HISTORY: family history includes Breast cancer in her cousins and paternal aunt; Cancer in her father; Cervical cancer in her paternal aunt; Colon cancer in her paternal aunt; Lung cancer in her father; and Ovarian cancer in her cousin.  SOCIAL HISTORY:  reports that she has never smoked. She has never used smokeless tobacco. She reports that she does not drink alcohol or use illicit drugs. 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 She is married and accompanied by her husband and daughter on evaluation today.  ALLERGIES: Review of patient's allergies indicates no known allergies.  MEDICATIONS:  Current Outpatient Prescriptions  Medication Sig Dispense Refill  . ALPRAZolam (XANAX) 1 MG tablet Take 1 mg by mouth at bedtime as needed.        Marland Kitchen amphetamine-dextroamphetamine (ADDERALL) 10 MG tablet Take 20 mg by mouth daily.       . cholecalciferol (VITAMIN D) 1000 UNITS tablet Take 2,000 Units by mouth daily. With vitamin E 400 iu together.      . escitalopram (LEXAPRO) 20 MG tablet Take 20 mg by mouth daily.        Marland Kitchen esomeprazole (NEXIUM) 40 MG capsule Take 40 mg by mouth daily before breakfast.        . Multiple Vitamins-Minerals (MULTIVITAMIN WITH MINERALS) tablet Take 1 tablet by mouth daily.        . norethindrone-ethinyl estradiol (NORTREL 7/7/7) 0.5/0.75/1-35 MG-MCG per tablet Take 1 tablet by mouth daily.        . valsartan (DIOVAN) 320 MG tablet Take 320  mg by mouth daily.        . valsartan-hydrochlorothiazide (DIOVAN-HCT) 320-25 MG per tablet Take 1 tablet by mouth daily.       . vitamin B-12 (CYANOCOBALAMIN) 1000 MCG tablet Take 1,000 mcg by mouth daily.       No current facility-administered medications for this encounter.    REVIEW OF SYSTEMS:  A 15 point review of systems is documented in the electronic medical record. This was obtained by the nursing staff. However, I  reviewed this with the patient to discuss relevant findings and make appropriate changes.  Prior to biopsy she denied any pain within the breast area nipple discharge or bleeding. Patient denies any new bony pain headaches dizziness or blurred vision.   PHYSICAL EXAM: This is a very pleasant 57 year old female in no acute distress. She is somewhat overwhelmed with the overall diagnosis.  Examination of the neck and supraclavicular region reveals no evidence of adenopathy. the right axilla is free of adenopathy. The left axilla reveals a firm node which is estimated to be approximately 2 cm within the axillary area. Examination of the lungs reveals them to be clear. The heart has a regular rhythm and rate. Examination of the right breast reveals no mass or nipple discharge. Examination of the left breast reveals some bruising in the upper inner aspect of the breast. There is some induration in 2 separate areas which could be a tumor or bruising from her recent biopsies. There is no nipple discharge or or bleeding.    LABORATORY DATA:  Lab Results  Component Value Date   WBC 6.4 01/29/2013   HGB 11.8 01/29/2013   HCT 35.3 01/29/2013   MCV 86.1 01/29/2013   PLT 160 01/29/2013   Lab Results  Component Value Date   NA 138 01/29/2013   K 3.3* 01/29/2013   CL 103 01/29/2013   CO2 28 01/29/2013   Lab Results  Component Value Date   ALT 22 01/29/2013   AST 20 01/29/2013   ALKPHOS 49 01/29/2013   BILITOT 0.28 01/29/2013     RADIOGRAPHY: MRI pending    IMPRESSION: Clinical stage II, multifocal, high-grade invasive ductal carcinoma of the left breast.   the patient will proceed with MRI to rule out multicentric disease. If her disease is limited to the above-mentioned area she would be a candidate for breast conserving therapy with partial mastectomy,  axillary dissection and radiation therapy to follow.  The Patient does wish to proceed with breast conservation therapy if medically indicated.  PLAN: The  patient will proceed with her definitive surgery once her MRI is complete.  At this time it does not appear that she would benefit from neoadjuvant chemotherapy unless her MRI shows more significant bulky disease within the breast.  I spent 40 minutes minutes face to face with the patient and more than 50% of that time was spent in counseling and/or coordination of care.   ------------------------------------------------  -----------------------------------  [name], PhD, MD

## 2013-01-29 NOTE — Progress Notes (Signed)
Checked in new patient. No financial issues. The patient had her [facility] Form

## 2013-01-29 NOTE — Patient Instructions (Signed)
We discussed your radiology and pathology  We discussed surgery and port placement  We discussed rational for chemotherapy.  We discussed AC q2 weeks x 4 cycles then taxol/carboplatin weekly x 12 weeks  Need genetic counseling   Chemo class/echocardiogram

## 2013-01-30 ENCOUNTER — Ambulatory Visit
Admission: RE | Admit: 2013-01-30 | Discharge: 2013-01-30 | Disposition: A | Discharge status: Home / Self Care | Payer: BC Managed Care – PPO | Source: Ambulatory Visit | Attending: Radiology | Admitting: Radiology

## 2013-01-30 DIAGNOSIS — C50912 Malignant neoplasm of unspecified site of left female breast: Secondary | ICD-10-CM

## 2013-01-30 LAB — CANCER ANTIGEN 27.29: CA 27.29: 52 U/mL — ABNORMAL HIGH (ref 0–39)

## 2013-01-30 MED ORDER — GADOBENATE DIMEGLUMINE 529 MG/ML IV SOLN
14.0000 mL | Freq: Once | INTRAVENOUS | Status: AC | PRN
  Administered 2013-01-30: 14 mL via INTRAVENOUS

## 2013-01-31 ENCOUNTER — Encounter (HOSPITAL_COMMUNITY): Payer: Self-pay

## 2013-01-31 ENCOUNTER — Ambulatory Visit (HOSPITAL_COMMUNITY)
Admission: RE | Admit: 2013-01-31 | Discharge: 2013-01-31 | Disposition: A | Discharge status: Home / Self Care | Payer: BC Managed Care – PPO | Source: Ambulatory Visit | Attending: General Surgery | Admitting: General Surgery

## 2013-01-31 ENCOUNTER — Encounter (HOSPITAL_COMMUNITY)
Admission: RE | Admit: 2013-01-31 | Discharge: 2013-01-31 | Disposition: A | Discharge status: Home / Self Care | Payer: BC Managed Care – PPO | Source: Ambulatory Visit | Attending: General Surgery | Admitting: General Surgery

## 2013-01-31 DIAGNOSIS — Z01818 Encounter for other preprocedural examination: Secondary | ICD-10-CM | POA: Insufficient documentation

## 2013-01-31 DIAGNOSIS — I10 Essential (primary) hypertension: Secondary | ICD-10-CM | POA: Insufficient documentation

## 2013-01-31 DIAGNOSIS — Z01812 Encounter for preprocedural laboratory examination: Secondary | ICD-10-CM | POA: Insufficient documentation

## 2013-01-31 LAB — CBC
HCT: 33.8 % — ABNORMAL LOW (ref 36.0–46.0)
Hemoglobin: 11.5 g/dL — ABNORMAL LOW (ref 12.0–15.0)
MCH: 28.7 pg (ref 26.0–34.0)
MCHC: 34 g/dL (ref 30.0–36.0)
MCV: 84.3 fL (ref 78.0–100.0)
RDW: 13.7 % (ref 11.5–15.5)

## 2013-01-31 LAB — BASIC METABOLIC PANEL
BUN: 10 mg/dL (ref 6–23)
CO2: 30 mEq/L (ref 19–32)
Chloride: 99 mEq/L (ref 96–112)
Creatinine, Ser: 0.85 mg/dL (ref 0.50–1.10)
GFR calc Af Amer: 86 mL/min — ABNORMAL LOW (ref >90)
Glucose, Bld: 94 mg/dL (ref 70–99)
Potassium: 3.1 mEq/L — ABNORMAL LOW (ref 3.5–5.1)
Sodium: 138 mEq/L (ref 135–145)

## 2013-01-31 LAB — SURGICAL PCR SCREEN
MRSA, PCR: NEGATIVE
Staphylococcus aureus: POSITIVE — AB

## 2013-01-31 NOTE — Pre-Procedure Instructions (Signed)
Kristi [name]  01/31/2013   Your procedure is scheduled on:  Tuesday February 04, 2013  Report to [facility] at 1015 AM.  Call this number if you have problems the morning of surgery: [phone]   Remember:   Do not eat food or drink liquids after midnight.   Take these medicines the morning of surgery with A SIP OF WATER: Alprazolam(Xanax), Adderall, Lexapro(Escitalopram), and Esomeprazole(Nexium)   Do not wear jewelry, make-up or nail polish.  Do not wear lotions, powders, or perfumes. You may wear deodorant.  Do not shave 48 hours prior to surgery.   Do not bring valuables to the hospital.  Contacts, dentures or bridgework may not be worn into surgery.  Leave suitcase in the car. After surgery it may be brought to your room.  For patients admitted to the hospital, checkout time is 11:00 AM the day of  discharge.   Patients discharged the day of surgery will not be allowed to drive  home.    Special Instructions: Shower using CHG 2 nights before surgery and the night before surgery.  If you shower the day of surgery use CHG.  Use special wash - you have one bottle of CHG for all showers.  You should use approximately 1/3 of the bottle for each shower.   Please read over the following fact sheets that you were given: Pain Booklet, Coughing and Deep Breathing, MRSA Information and Surgical Site Infection Prevention

## 2013-02-03 ENCOUNTER — Telehealth: Payer: Self-pay | Admitting: *Deleted

## 2013-02-03 MED ORDER — CEFAZOLIN SODIUM-DEXTROSE 2-3 GM-% IV SOLR
2.0000 g | INTRAVENOUS | Status: AC
  Administered 2013-02-04: 2 g via INTRAVENOUS

## 2013-02-03 NOTE — Telephone Encounter (Signed)
Spoke to pt concerning [facility] from 01/29/13.  Pt denies questions or concerns regarding dx or treatment care plan.  Confirmed surgery date and future appts.  Encourage pt to with needs. Received verbal understanding.

## 2013-02-04 ENCOUNTER — Encounter (HOSPITAL_COMMUNITY): Payer: Self-pay | Admitting: General Surgery

## 2013-02-04 ENCOUNTER — Observation Stay (HOSPITAL_COMMUNITY): Payer: BC Managed Care – PPO

## 2013-02-04 ENCOUNTER — Encounter: Payer: Self-pay | Admitting: *Deleted

## 2013-02-04 ENCOUNTER — Ambulatory Visit (HOSPITAL_COMMUNITY): Payer: BC Managed Care – PPO | Admitting: Anesthesiology

## 2013-02-04 ENCOUNTER — Observation Stay (HOSPITAL_COMMUNITY)
Admission: RE | Admit: 2013-02-04 | Discharge: 2013-02-05 | Disposition: A | Discharge status: Home / Self Care | Payer: BC Managed Care – PPO | Source: Ambulatory Visit | Attending: General Surgery | Admitting: General Surgery

## 2013-02-04 ENCOUNTER — Encounter (HOSPITAL_COMMUNITY): Payer: Self-pay | Admitting: Anesthesiology

## 2013-02-04 ENCOUNTER — Encounter (HOSPITAL_COMMUNITY)
Admission: RE | Disposition: A | Discharge status: Home / Self Care | Payer: Self-pay | Source: Ambulatory Visit | Attending: General Surgery

## 2013-02-04 DIAGNOSIS — C50919 Malignant neoplasm of unspecified site of unspecified female breast: Secondary | ICD-10-CM

## 2013-02-04 DIAGNOSIS — C773 Secondary and unspecified malignant neoplasm of axilla and upper limb lymph nodes: Secondary | ICD-10-CM | POA: Insufficient documentation

## 2013-02-04 DIAGNOSIS — I10 Essential (primary) hypertension: Secondary | ICD-10-CM | POA: Insufficient documentation

## 2013-02-04 DIAGNOSIS — C50212 Malignant neoplasm of upper-inner quadrant of left female breast: Secondary | ICD-10-CM

## 2013-02-04 HISTORY — PX: AXILLARY LYMPH NODE DISSECTION: SHX5229

## 2013-02-04 HISTORY — PX: PORTACATH PLACEMENT: SHX2246

## 2013-02-04 HISTORY — PX: BREAST LUMPECTOMY WITH NEEDLE LOCALIZATION: SHX5759

## 2013-02-04 LAB — CBC
HCT: 32.2 % — ABNORMAL LOW (ref 36.0–46.0)
MCHC: 34.2 g/dL (ref 30.0–36.0)
MCV: 82.6 fL (ref 78.0–100.0)
Platelets: 171 10*3/uL (ref 150–400)
RDW: 13.7 % (ref 11.5–15.5)

## 2013-02-04 LAB — CREATININE, SERUM: GFR calc non Af Amer: >90 mL/min (ref >90)

## 2013-02-04 SURGERY — BREAST LUMPECTOMY WITH NEEDLE LOCALIZATION
Anesthesia: General | Site: Chest | Laterality: Right | Wound class: Clean

## 2013-02-04 MED ORDER — OXYCODONE HCL 5 MG PO TABS: 5.0000 mg | ORAL_TABLET | Freq: Once | ORAL | Status: DC | PRN

## 2013-02-04 MED ORDER — ONDANSETRON HCL 4 MG PO TABS: 4.0000 mg | ORAL_TABLET | Freq: Four times a day (QID) | ORAL | Status: DC | PRN

## 2013-02-04 MED ORDER — LACTATED RINGERS IV SOLN
INTRAVENOUS | Status: DC | PRN
  Administered 2013-02-04 (×2): via INTRAVENOUS

## 2013-02-04 MED ORDER — LIDOCAINE HCL (CARDIAC) 20 MG/ML IV SOLN
INTRAVENOUS | Status: DC | PRN
  Administered 2013-02-04: 80 mg via INTRAVENOUS

## 2013-02-04 MED ORDER — KETOROLAC TROMETHAMINE 15 MG/ML IJ SOLN
15.0000 mg | Freq: Four times a day (QID) | INTRAMUSCULAR | Status: DC
  Administered 2013-02-04 – 2013-02-05 (×3): 15 mg via INTRAVENOUS
  Filled 2013-02-04 (×7): qty 1

## 2013-02-04 MED ORDER — PANTOPRAZOLE SODIUM 40 MG PO TBEC
40.0000 mg | DELAYED_RELEASE_TABLET | Freq: Every day | ORAL | Status: DC
  Administered 2013-02-04: 40 mg via ORAL
  Filled 2013-02-04: qty 1

## 2013-02-04 MED ORDER — OXYCODONE HCL 5 MG/5ML PO SOLN: 5.0000 mg | Freq: Once | ORAL | Status: DC | PRN

## 2013-02-04 MED ORDER — PROPOFOL 10 MG/ML IV BOLUS
INTRAVENOUS | Status: DC | PRN
  Administered 2013-02-04: 160 mg via INTRAVENOUS

## 2013-02-04 MED ORDER — MORPHINE SULFATE 2 MG/ML IJ SOLN
INTRAMUSCULAR | Status: AC
  Filled 2013-02-04: qty 1

## 2013-02-04 MED ORDER — ALPRAZOLAM 0.5 MG PO TABS: 1.0000 mg | ORAL_TABLET | Freq: Every evening | ORAL | Status: DC | PRN

## 2013-02-04 MED ORDER — GLYCOPYRROLATE 0.2 MG/ML IJ SOLN
INTRAMUSCULAR | Status: DC | PRN
  Administered 2013-02-04: 0.2 mg via INTRAVENOUS

## 2013-02-04 MED ORDER — IRBESARTAN 300 MG PO TABS
300.0000 mg | ORAL_TABLET | Freq: Every day | ORAL | Status: DC
  Filled 2013-02-04: qty 1

## 2013-02-04 MED ORDER — ESCITALOPRAM OXALATE 20 MG PO TABS
20.0000 mg | ORAL_TABLET | Freq: Every day | ORAL | Status: DC
  Filled 2013-02-04: qty 1

## 2013-02-04 MED ORDER — EPHEDRINE SULFATE 50 MG/ML IJ SOLN
INTRAMUSCULAR | Status: DC | PRN
  Administered 2013-02-04: 10 mg via INTRAVENOUS

## 2013-02-04 MED ORDER — SODIUM CHLORIDE 0.9 % IR SOLN
Status: DC | PRN
  Administered 2013-02-04: 16:00:00

## 2013-02-04 MED ORDER — VALSARTAN-HYDROCHLOROTHIAZIDE 320-25 MG PO TABS: 1.0000 | ORAL_TABLET | Freq: Every day | ORAL | Status: DC

## 2013-02-04 MED ORDER — LIDOCAINE HCL 1 % IJ SOLN
INTRAMUSCULAR | Status: DC | PRN
  Administered 2013-02-04 (×2): via INTRAMUSCULAR

## 2013-02-04 MED ORDER — LACTATED RINGERS IV SOLN
INTRAVENOUS | Status: DC
  Administered 2013-02-04: 12:00:00 via INTRAVENOUS

## 2013-02-04 MED ORDER — ACETAMINOPHEN 10 MG/ML IV SOLN
1000.0000 mg | Freq: Four times a day (QID) | INTRAVENOUS | Status: DC
  Administered 2013-02-04 – 2013-02-05 (×3): 1000 mg via INTRAVENOUS
  Filled 2013-02-04 (×5): qty 100

## 2013-02-04 MED ORDER — OXYCODONE-ACETAMINOPHEN 5-325 MG PO TABS: 1.0000 | ORAL_TABLET | ORAL | Status: DC | PRN

## 2013-02-04 MED ORDER — BUPIVACAINE-EPINEPHRINE PF 0.25-1:200000 % IJ SOLN
INTRAMUSCULAR | Status: AC
  Filled 2013-02-04: qty 30

## 2013-02-04 MED ORDER — HEPARIN SOD (PORK) LOCK FLUSH 100 UNIT/ML IV SOLN
INTRAVENOUS | Status: AC
  Filled 2013-02-04: qty 5

## 2013-02-04 MED ORDER — 0.9 % SODIUM CHLORIDE (POUR BTL) OPTIME
TOPICAL | Status: DC | PRN
  Administered 2013-02-04: 1000 mL

## 2013-02-04 MED ORDER — ONDANSETRON HCL 4 MG/2ML IJ SOLN
INTRAMUSCULAR | Status: DC | PRN
  Administered 2013-02-04: 4 mg via INTRAVENOUS

## 2013-02-04 MED ORDER — HYDROMORPHONE HCL PF 1 MG/ML IJ SOLN
0.2500 mg | INTRAMUSCULAR | Status: DC | PRN
  Administered 2013-02-04 (×2): 0.5 mg via INTRAVENOUS

## 2013-02-04 MED ORDER — ACETAMINOPHEN 10 MG/ML IV SOLN
INTRAVENOUS | Status: AC
  Administered 2013-02-04: 1000 mg via INTRAVENOUS
  Filled 2013-02-04: qty 100

## 2013-02-04 MED ORDER — AMPHETAMINE-DEXTROAMPHETAMINE 10 MG PO TABS: 20.0000 mg | ORAL_TABLET | Freq: Every day | ORAL | Status: DC

## 2013-02-04 MED ORDER — MORPHINE SULFATE 2 MG/ML IJ SOLN
1.0000 mg | INTRAMUSCULAR | Status: DC | PRN
  Administered 2013-02-04: 2 mg via INTRAVENOUS

## 2013-02-04 MED ORDER — HYDROCHLOROTHIAZIDE 25 MG PO TABS
25.0000 mg | ORAL_TABLET | Freq: Every day | ORAL | Status: DC
  Filled 2013-02-04: qty 1

## 2013-02-04 MED ORDER — HEPARIN SOD (PORK) LOCK FLUSH 100 UNIT/ML IV SOLN
INTRAVENOUS | Status: DC | PRN
  Administered 2013-02-04: 5 [IU] via INTRAVENOUS

## 2013-02-04 MED ORDER — ONDANSETRON HCL 4 MG/2ML IJ SOLN: 4.0000 mg | Freq: Four times a day (QID) | INTRAMUSCULAR | Status: DC | PRN

## 2013-02-04 MED ORDER — FENTANYL CITRATE 0.05 MG/ML IJ SOLN
INTRAMUSCULAR | Status: DC | PRN
  Administered 2013-02-04 (×2): 50 ug via INTRAVENOUS

## 2013-02-04 MED ORDER — MIDAZOLAM HCL 5 MG/5ML IJ SOLN
INTRAMUSCULAR | Status: DC | PRN
  Administered 2013-02-04: 2 mg via INTRAVENOUS

## 2013-02-04 MED ORDER — LIDOCAINE HCL (PF) 1 % IJ SOLN
INTRAMUSCULAR | Status: AC
  Filled 2013-02-04: qty 30

## 2013-02-04 MED ORDER — HYDROMORPHONE HCL PF 1 MG/ML IJ SOLN
INTRAMUSCULAR | Status: AC
  Filled 2013-02-04: qty 1

## 2013-02-04 MED ORDER — KCL IN DEXTROSE-NACL 20-5-0.45 MEQ/L-%-% IV SOLN
INTRAVENOUS | Status: DC
  Administered 2013-02-04: 19:00:00 via INTRAVENOUS
  Administered 2013-02-05: 1000 mL via INTRAVENOUS
  Filled 2013-02-04 (×5): qty 1000

## 2013-02-04 MED ORDER — ENOXAPARIN SODIUM 40 MG/0.4ML ~~LOC~~ SOLN
40.0000 mg | SUBCUTANEOUS | Status: DC
  Filled 2013-02-04: qty 0.4

## 2013-02-04 MED ORDER — CEFAZOLIN SODIUM 1-5 GM-% IV SOLN
1.0000 g | Freq: Four times a day (QID) | INTRAVENOUS | Status: AC
  Administered 2013-02-04 – 2013-02-05 (×3): 1 g via INTRAVENOUS
  Filled 2013-02-04 (×3): qty 50

## 2013-02-04 SURGICAL SUPPLY — 87 items
ADH SKN CLS APL DERMABOND .7 (GAUZE/BANDAGES/DRESSINGS) ×3
BAG DECANTER FOR FLEXI CONT (MISCELLANEOUS) ×5 IMPLANT
BINDER BREAST LRG (GAUZE/BANDAGES/DRESSINGS) IMPLANT
BINDER BREAST XLRG (GAUZE/BANDAGES/DRESSINGS) ×1 IMPLANT
BLADE SURG 10 STRL SS (BLADE) ×4 IMPLANT
BLADE SURG 11 STRL SS (BLADE) ×4 IMPLANT
BLADE SURG 15 STRL LF DISP TIS (BLADE) ×3 IMPLANT
BLADE SURG 15 STRL SS (BLADE) ×4
BNDG COHESIVE 4X5 TAN STRL (GAUZE/BANDAGES/DRESSINGS) ×1 IMPLANT
BNDG COHESIVE 6X5 TAN STRL LF (GAUZE/BANDAGES/DRESSINGS) ×4 IMPLANT
CANISTER SUCTION 2500CC (MISCELLANEOUS) ×4 IMPLANT
CHLORAPREP W/TINT 10.5 ML (MISCELLANEOUS) ×5 IMPLANT
CHLORAPREP W/TINT 26ML (MISCELLANEOUS) ×4 IMPLANT
CLIP TI LARGE 6 (CLIP) ×4 IMPLANT
CLIP TI MEDIUM 24 (CLIP) ×4 IMPLANT
CLIP TI WIDE RED SMALL 24 (CLIP) ×4 IMPLANT
CLOTH BEACON ORANGE TIMEOUT ST (SAFETY) ×4 IMPLANT
CONT SPEC 4OZ CLIKSEAL STRL BL (MISCELLANEOUS) IMPLANT
CONT SPEC STER OR (MISCELLANEOUS) ×4 IMPLANT
COVER DRAPE ULTRASOUND 610 023 (DRAPES) IMPLANT
COVER PROBE W GEL 5X96 (DRAPES) ×4 IMPLANT
COVER SURGICAL LIGHT HANDLE (MISCELLANEOUS) ×4 IMPLANT
CRADLE DONUT ADULT HEAD (MISCELLANEOUS) ×4 IMPLANT
DECANTER SPIKE VIAL GLASS SM (MISCELLANEOUS) ×8 IMPLANT
DERMABOND ADVANCED (GAUZE/BANDAGES/DRESSINGS) ×1
DERMABOND ADVANCED .7 DNX12 (GAUZE/BANDAGES/DRESSINGS) ×3 IMPLANT
DEVICE DUBIN SPECIMEN MAMMOGRA (MISCELLANEOUS) ×4 IMPLANT
DRAPE C-ARM 42X72 X-RAY (DRAPES) ×4 IMPLANT
DRAPE LAPAROSCOPIC ABDOMINAL (DRAPES) ×4 IMPLANT
DRAPE SURG 17X23 STRL (DRAPES) ×1 IMPLANT
DRAPE UTILITY 15X26 W/TAPE STR (DRAPE) ×8 IMPLANT
DRAPE WARM FLUID 44X44 (DRAPE) ×1 IMPLANT
DRSG PAD ABDOMINAL 8X10 ST (GAUZE/BANDAGES/DRESSINGS) ×1 IMPLANT
ELECT CAUTERY BLADE 6.4 (BLADE) ×4 IMPLANT
ELECT REM PT RETURN 9FT ADLT (ELECTROSURGICAL) ×4
ELECTRODE REM PT RTRN 9FT ADLT (ELECTROSURGICAL) ×3 IMPLANT
EVACUATOR SILICONE 100CC (DRAIN) ×1 IMPLANT
GAUZE SPONGE 4X4 16PLY XRAY LF (GAUZE/BANDAGES/DRESSINGS) ×4 IMPLANT
GLOVE BIO SURGEON STRL SZ 6 (GLOVE) ×6 IMPLANT
GLOVE BIO SURGEON STRL SZ7 (GLOVE) ×1 IMPLANT
GLOVE BIO SURGEON STRL SZ7.5 (GLOVE) ×1 IMPLANT
GLOVE BIOGEL PI IND STRL 6.5 (GLOVE) ×3 IMPLANT
GLOVE BIOGEL PI IND STRL 7.5 (GLOVE) IMPLANT
GLOVE BIOGEL PI IND STRL 8 (GLOVE) IMPLANT
GLOVE BIOGEL PI INDICATOR 6.5 (GLOVE) ×2
GLOVE BIOGEL PI INDICATOR 7.5 (GLOVE) ×1
GLOVE BIOGEL PI INDICATOR 8 (GLOVE) ×1
GOWN PREVENTION PLUS XXLARGE (GOWN DISPOSABLE) ×8 IMPLANT
GOWN STRL NON-REIN LRG LVL3 (GOWN DISPOSABLE) ×4 IMPLANT
GOWN STRL REIN XL XLG (GOWN DISPOSABLE) ×1 IMPLANT
KIT BASIN OR (CUSTOM PROCEDURE TRAY) ×4 IMPLANT
KIT MARKER MARGIN INK (KITS) ×4 IMPLANT
KIT PORT POWER 9.6FR MRI PREA (Catheter) IMPLANT
KIT PORT POWER ISP 8FR (Catheter) IMPLANT
KIT POWER CATH 8FR (Catheter) ×1 IMPLANT
KIT ROOM TURNOVER OR (KITS) ×4 IMPLANT
NDL 18GX1X1/2 (RX/OR ONLY) (NEEDLE) ×3 IMPLANT
NDL HYPO 25GX1X1/2 BEV (NEEDLE) ×6 IMPLANT
NEEDLE 18GX1X1/2 (RX/OR ONLY) (NEEDLE) ×4 IMPLANT
NEEDLE HYPO 25GX1X1/2 BEV (NEEDLE) ×8 IMPLANT
NS IRRIG 1000ML POUR BTL (IV SOLUTION) ×4 IMPLANT
PACK SURGICAL SETUP 50X90 (CUSTOM PROCEDURE TRAY) ×4 IMPLANT
PACK UNIVERSAL I (CUSTOM PROCEDURE TRAY) ×4 IMPLANT
PAD ARMBOARD 7.5X6 YLW CONV (MISCELLANEOUS) ×8 IMPLANT
PENCIL BUTTON HOLSTER BLD 10FT (ELECTRODE) ×4 IMPLANT
SPECIMEN JAR MEDIUM (MISCELLANEOUS) ×1 IMPLANT
SPONGE GAUZE 4X4 12PLY (GAUZE/BANDAGES/DRESSINGS) ×4 IMPLANT
SPONGE LAP 18X18 X RAY DECT (DISPOSABLE) ×5 IMPLANT
STOCKINETTE IMPERVIOUS 9X36 MD (GAUZE/BANDAGES/DRESSINGS) ×4 IMPLANT
STRIP CLOSURE SKIN 1/2X4 (GAUZE/BANDAGES/DRESSINGS) ×6 IMPLANT
SUT MNCRL AB 4-0 PS2 18 (SUTURE) ×4 IMPLANT
SUT MON AB 4-0 PC3 18 (SUTURE) ×4 IMPLANT
SUT PROLENE 2 0 SH DA (SUTURE) ×10 IMPLANT
SUT SILK 2 0 FS (SUTURE) ×4 IMPLANT
SUT VIC AB 2-0 SH 27 (SUTURE) ×4
SUT VIC AB 2-0 SH 27XBRD (SUTURE) ×3 IMPLANT
SUT VIC AB 3-0 SH 27 (SUTURE) ×16
SUT VIC AB 3-0 SH 27X BRD (SUTURE) ×3 IMPLANT
SUT VIC AB 3-0 SH 27XBRD (SUTURE) IMPLANT
SYR 20ML ECCENTRIC (SYRINGE) ×8 IMPLANT
SYR 5ML LUER SLIP (SYRINGE) ×4 IMPLANT
SYR BULB IRRIGATION 50ML (SYRINGE) ×1 IMPLANT
SYR CONTROL 10ML LL (SYRINGE) ×9 IMPLANT
TOWEL OR 17X24 6PK STRL BLUE (TOWEL DISPOSABLE) ×4 IMPLANT
TOWEL OR 17X26 10 PK STRL BLUE (TOWEL DISPOSABLE) ×4 IMPLANT
TUBE CONNECTING 12X1/4 (SUCTIONS) ×4 IMPLANT
YANKAUER SUCT BULB TIP NO VENT (SUCTIONS) ×4 IMPLANT

## 2013-02-04 NOTE — Progress Notes (Signed)
[facility] Psychosocial Distress Screening Clinical Social Work  Patient completed distress screening protocol and scored a 8 on the Psychosocial Distress Thermometer which indicates moderate distress. Clinical Child psychotherapist met with pt and pt's family in [facility] to assess for distress and other psychosocial needs.  Pt stated she was doing well but did present a little overwhelmed.  Pt did state she felt better after speaking with the physicians and getting information on her treatment plan.  CSW informed pt about the support team and support services at [facility], and encouraged pt to call with any questions or concerns.    Tamala Julian, MSW, LCSW Clinical Social Worker [facility] [phone]

## 2013-02-04 NOTE — Interval H&P Note (Signed)
History and Physical Interval Note:  02/04/2013 12:35 PM  Kristi [name]  has presented today for surgery, with the diagnosis of left breast cancer  The various methods of treatment have been discussed with the patient and family. After consideration of risks, benefits and other options for treatment, the patient has consented to  Procedure(s): LEFT BREAST LUMPECTOMY WITH NEEDLE LOCALIZATION (Left) LEFT AXILLARY LYMPH NODE DISSECTION (Left) INSERTION PORT-A-CATH (N/A) as a surgical intervention .  The patient's history has been reviewed, patient examined, no change in status, stable for surgery.  I have reviewed the patient's chart and labs.  Questions were answered to the patient's satisfaction.     Froylan Hobby

## 2013-02-04 NOTE — Op Note (Signed)
Left Breast Lumpectomy with Axillary lymph node dissection,  placement of Right subclavian port a cath  Indications: This patient presents with history of left breast cancer with clinically positive axillary lymph node exam, and pathologically positive LN on biopsy.  Pre-operative Diagnosis: Left breast cancer, cT2N1  Post-operative Diagnosis: same  Surgeon: Almond Lint   Assistant:  Axel Filler  Anesthesia: General endotracheal anesthesia  ASA Class: 2  Procedure Details  The patient was seen in the Holding Room. The risks, benefits, complications, treatment options, and expected outcomes were discussed with the patient. The possibilities of bleeding, infection, the need for additional procedures, failure to diagnose a condition, and creating a complication requiring transfusion or operation were discussed with the patient. The patient concurred with the proposed plan, giving informed consent.  The site of surgery properly noted/marked. The patient was taken to Operating Room # 9, identified  and the procedure verified as left Breast Lumpectomy and axillary lymph node dissection, port a cath placement. A Time Out was held and the above information confirmed.  The left arm, breast, and bilateral chest were prepped and draped in standard fashion.  The lumpectomy was performed by creating an transverse incision over the upperouter quadrant of the breast around the previously placed localization guidewire.  Dissection was carried down to the pectoral fascia. Curved mayo scissors were used to dissect around the wire in a cylinder shape.  The pectoralis fascia was taken posteriorly. Additional tissue was taken medially. The edges of the cavity were marked with large clips, with one each medial, lateral, inferior and superior, and two clips posteriorly.   The specimen was inked with the margin marker paint kit.    Specimen radiography confirmed inclusion of the mammographic lesion.  Hemostasis was  achieved with cautery.  The wound was irrigated and closed with 3-0 vicryl in layers and 4-0 monocryl subcuticular suture.    An curvalinear incision was made in the axilla. An axillary dissection was performed with removal of the associated lymph nodes and surrounding adipose tissue. This included levels I and II. This was accomplished by exposing the axillary vein anteriorly and inferiorly to the level of the pectoralis minor and laterally over the latissimus dorsi muscle. Posteriorly, the dissection continued to the subscapularis.  Small venous tributaries, lymphatics, and vessels were clipped and ligated or cauterized and divided. The subscapularis muscle was skeletonized. The long thoracic and thoracodorsal neurovascular bundles were identified and preserved.  A 19 Fr Blake drain was placed.  The wound was irrigated and closed with a 3-0 Vicryl deep dermal interrupted and a 4-0 vicryl subcuticular closure in layers.    Sterile dressings were applied. At the end of the operation, all sponge, instrument, and needle counts were correct.  Patient's arms were tucked and the upper   chest and neck were prepped and draped in sterile fashion.  Time-out was   performed according to the surgical safety check list.  When all was   correct, we continued.   Local anesthetic was administered over this   area at the angle of the clavicle.  The vein was accessed with 1 pass of the needle. There was good venous return and the wire passed easily with no ectopy.   Fluoroscopy was used to confirm that the wire was in the vena cava.      The patient was placed back level and the area for the pocket was anethetized   with local anesthetic.  A 3-cm transverse incision was made with a #15  blade.  Cautery was used to divide the subcutaneous tissues down to the   pectoralis muscle.  An Army-Nav[employer] retractor was used to elevate the skin   while a pocket was created on top of the pectoralis fascia.  The port   was placed  into the pocket to confirm that it was of adequate size.  The   catheter was preattached to the port.  The port was then secured to the   pectoralis fascia with four 2-0 Prolene sutures.  These were clamped and   not tied down yet.    The catheter was tunneled through to the wire exit   site.  The catheter was placed along the wire to determine what length it should be to be in the SVC.  The catheter was cut at 18.5 cm.  The tunneler sheath and dilator were passed over the wire and the dilator and wire were removed.  The catheter was advanced through the tunneler sheath and the tunneler sheath was pulled away.  Care was taken to keep the catheter in the tunneler sheath as this occurred. This was advanced and the tunneler sheath was removed.  There was good venous   return and easy flush of the catheter.  The Prolene sutures were tied   down to the pectoral fascia.  The skin was reapproximated using 3-0   Vicryl interrupted deep dermal sutures.    Fluoroscopy was used to re-confirm good position of the catheter.  The skin   was then closed using 4-0 Monocryl in a subcuticular fashion.  The port was flushed with concentrated heparin flush as well.  The wounds were then cleaned, dried, and dressed with Dermabond.  The patient was awakened from anesthesia and taken to the PACU in stable condition.  Needle, sponge, and instrument counts were correct.     Findings: grossly clear surgical margins  Estimated Blood Loss:  minimal         Specimens: L breast lumpectomy and axillary lymph node contents.           Complications:  None; patient tolerated the procedure well.         Disposition: PACU - hemodynamically stable.         Condition: stable

## 2013-02-04 NOTE — Anesthesia Preprocedure Evaluation (Addendum)
Anesthesia Evaluation  Patient identified by MRN, date of birth, ID band Patient awake    Reviewed: Allergy & Precautions, H&P , NPO status , Patient's Chart, lab work & pertinent test results  History of Anesthesia Complications Negative for: history of anesthetic complications  Airway Mallampati: II TM Distance: >3 FB Neck ROM: full    Dental  (+) Caps, Teeth Intact and Dental Advisory Given,    Pulmonary neg pulmonary ROS,          Cardiovascular hypertension, Pt. on medications     Neuro/Psych PSYCHIATRIC DISORDERS Anxiety negative neurological ROS     GI/Hepatic Neg liver ROS, GERD-  Controlled,  Endo/Other  negative endocrine ROS  Renal/GU negative Renal ROS     Musculoskeletal negative musculoskeletal ROS (+)   Abdominal   Peds  Hematology negative hematology ROS (+)   Anesthesia Other Findings   Reproductive/Obstetrics negative OB ROS                        Anesthesia Physical Anesthesia Plan  ASA: II  Anesthesia Plan: General   Post-op Pain Management:    Induction: Intravenous  Airway Management Planned: LMA  Additional Equipment:   Intra-op Plan:   Post-operative Plan:   Informed Consent: I have reviewed the patients History and Physical, chart, labs and discussed the procedure including the risks, benefits and alternatives for the proposed anesthesia with the patient or authorized representative who has indicated his/her understanding and acceptance.     Plan Discussed with: CRNA and Surgeon  Anesthesia Plan Comments:         Anesthesia Quick Evaluation

## 2013-02-04 NOTE — Anesthesia Postprocedure Evaluation (Signed)
Anesthesia Post Note  Patient: Kristi [name]  Procedure(s) Performed: Procedure(s) (LRB): LEFT BREAST LUMPECTOMY WITH NEEDLE LOCALIZATION (Left) LEFT AXILLARY LYMPH NODE DISSECTION (Left) INSERTION PORT-A-CATH (Right)  Anesthesia type: General  Patient location: PACU  Post pain: Pain level controlled and Adequate analgesia  Post assessment: Post-op Vital signs reviewed, Patient's Cardiovascular Status Stable, Respiratory Function Stable, Patent Airway and Pain level controlled  Last Vitals:  Filed Vitals:   02/04/13 1715  BP: 121/66  Pulse: 57  Temp:   Resp: 15    Post vital signs: Reviewed and stable  Level of consciousness: awake, alert  and oriented  Complications: No apparent anesthesia complications

## 2013-02-04 NOTE — H&P (View-Only) (Signed)
Chief complaint:  Left breast cancer  HISTORY: Pt is a 57 yo F with screening detected ill defined left breast mass and enlarged LN.  She has not been able to feel mass.  She has a family history of paternal aunt with breast cancer , 2 maternal cousins with breast cancer, and 1 maternal cousin with ovarian cancer.  She has one child.  She had menarche at age 14.  She is still having periods.  She has used hormone contraception for decades.  She is very anxious.  She had biopsy with hormone positive breast cancer.    Past Medical History  Diagnosis Date  . Anxiety   . GERD (gastroesophageal reflux disease)   . Hypertension   . Breast cancer     Past Surgical History  Procedure Laterality Date  . Appendectomy    . Anal sphincterotomy  04/2011  . Hemorrhoid surgery  04/2011    ligation    Current Outpatient Prescriptions  Medication Sig Dispense Refill  . ALPRAZolam (XANAX) 1 MG tablet Take 1 mg by mouth at bedtime as needed.        . amphetamine-dextroamphetamine (ADDERALL) 10 MG tablet Take 20 mg by mouth daily.       . cholecalciferol (VITAMIN D) 1000 UNITS tablet Take 2,000 Units by mouth daily. With vitamin E 400 iu together.      . escitalopram (LEXAPRO) 20 MG tablet Take 20 mg by mouth daily.        . esomeprazole (NEXIUM) 40 MG capsule Take 40 mg by mouth daily before breakfast.        . Multiple Vitamins-Minerals (MULTIVITAMIN WITH MINERALS) tablet Take 1 tablet by mouth daily.        . norethindrone-ethinyl estradiol (NORTREL 7/7/7) 0.5/0.75/1-35 MG-MCG per tablet Take 1 tablet by mouth daily.        . valsartan (DIOVAN) 320 MG tablet Take 320 mg by mouth daily.        . valsartan-hydrochlorothiazide (DIOVAN-HCT) 320-25 MG per tablet Take 1 tablet by mouth daily.       . vitamin B-12 (CYANOCOBALAMIN) 1000 MCG tablet Take 1,000 mcg by mouth daily.       No current facility-administered medications for this visit.     No Known Allergies   Family History  Problem Relation  Age of Onset  . Cancer Father   . Lung cancer Father   . Breast cancer Paternal Aunt   . Colon cancer Paternal Aunt   . Cervical cancer Paternal Aunt   . Ovarian cancer Cousin   . Breast cancer Cousin   . Breast cancer Cousin      History   Social History  . Marital Status: Married    Spouse Name: N/A    Number of Children: N/A  . Years of Education: N/A   Social History Main Topics  . Smoking status: Never Smoker   . Smokeless tobacco: Never Used  . Alcohol Use: No  . Drug Use: No  . Sexually Active: Yes     REVIEW OF SYSTEMS - PERTINENT POSITIVES ONLY: 12 point review of systems negative other than HPI and PMH except for anxiety  EXAM: Wt Readings from Last 3 Encounters:  01/29/13 164 lb 8 oz (74.617 kg)   Temp Readings from Last 3 Encounters:  01/29/13 98.6 F (37 C) Oral   BP Readings from Last 3 Encounters:  01/29/13 117/73   Pulse Readings from Last 3 Encounters:  01/29/13 71    Gen:    No acute distress.  Well nourished and well groomed.   Neurological: Alert and oriented to person, place, and time. Coordination normal.  Head: Normocephalic and atraumatic.  Eyes: Conjunctivae are normal. Pupils are equal, round, and reactive to light. No scleral icterus.  Neck: Normal range of motion. Neck supple. No tracheal deviation or thyromegaly present.  Cardiovascular: Normal rate, regular rhythm, normal heart sounds and intact distal pulses.  Exam reveals no gallop and no friction rub.  No murmur heard. Respiratory: Effort normal.  No respiratory distress. No chest wall tenderness. Breath sounds normal.  No wheezes, rales or rhonchi.  Breast:  Pt has ptotic breasts with left side larger.  There are no palpable masses.  There is a palpable 2 cm lymph node in the left axilla.  There is no nipple retraction or skin dimpling.   GI: Soft. Bowel sounds are normal. The abdomen is soft and nontender.  There is no rebound and no guarding.  Musculoskeletal: Normal range of  motion. Extremities are nontender.  Lymphadenopathy: No cervical, preauricular, postauricular or axillary adenopathy is present Skin: Skin is warm and dry. No rash noted. No diaphoresis. No erythema. No pallor. No clubbing, cyanosis, or edema.   Psychiatric: Normal mood and affect. Behavior is normal. Judgment and thought content normal.    LABORATORY RESULTS: Available labs are reviewed  CBC, K 3.3, Alb 3.3  RADIOLOGY RESULTS: See E-Chart or I-Site for most recent results.  Images and reports are reviewed. 1cm mass at 11 o'clock, 1.6 cm mass at 12 o'clock.  MR pending.     ASSESSMENT AND PLAN: Cancer of upper-inner quadrant of female breast Pt with cT1cN1Mx left breast cancer. This is essentially a triple negative breast cancer with elevated Ki67%.  Her MRI is pending.   We will tentatively plan left needle localized lumpectomy, axillary lymph node dissection, and port a cath placement.   If she has a much larger tumor than expected, I would just do port.  If she appears to have multicentric disease, she would need mastectomy.  Either way, she will stay overnight in the hospital.   The surgical procedure was described to the patient.  I discussed the incision type and location and that we would need radiology involved on the day of surgery with a wire marker.      The risks and benefits of the procedure were described to the patient and she wishes to proceed.    We discussed the risks bleeding, infection, damage to other structures, need for further procedures/surgeries.  We discussed the risk of seroma.  The patient was advised if the area in the breast in cancer, we may need to go back to surgery for additional tissue to obtain negative margins or for a lymph node biopsy. The patient was advised that these are the most common complications, but that others can occur as well.  They were advised against taking aspirin or other anti-inflammatory agents/blood thinners the week before  surgery.   She was advised that she will need to take some time off work.    30 minutes were spent in counseling with the pt.       Kaleb Linquist L Antino Mayabb MD Surgical Oncology, General and Endocrine Surgery [facility]      Visit Diagnoses: 1. Breast cancer, left   2. Cancer of upper-inner quadrant of female breast, left     Primary Care Physician: AMEEN, WILLIAM OTIS, MD    

## 2013-02-04 NOTE — Transfer of Care (Signed)
Immediate Anesthesia Transfer of Care Note  Patient: Kristi [name]  Procedure(s) Performed: Procedure(s) with comments: LEFT BREAST LUMPECTOMY WITH NEEDLE LOCALIZATION (Left) LEFT AXILLARY LYMPH NODE DISSECTION (Left) - End: 1512 INSERTION PORT-A-CATH (Right) - Start Time: 1538.  Patient Location: PACU  Anesthesia Type:General  Level of Consciousness: awake, alert  and oriented  Airway & Oxygen Therapy: Patient Spontanous Breathing and Patient connected to nasal cannula oxygen  Post-op Assessment: Report given to PACU RN and Post -op Vital signs reviewed and stable  Post vital signs: Reviewed and stable  Complications: No apparent anesthesia complications

## 2013-02-04 NOTE — Preoperative (Signed)
Beta Blockers   Reason not to administer Beta Blockers:Not Applicable 

## 2013-02-05 ENCOUNTER — Telehealth (INDEPENDENT_AMBULATORY_CARE_PROVIDER_SITE_OTHER): Payer: Self-pay | Admitting: *Deleted

## 2013-02-05 LAB — BASIC METABOLIC PANEL
BUN: 8 mg/dL (ref 6–23)
Calcium: 8 mg/dL — ABNORMAL LOW (ref 8.4–10.5)
Chloride: 102 mEq/L (ref 96–112)
Creatinine, Ser: 0.86 mg/dL (ref 0.50–1.10)
GFR calc Af Amer: 85 mL/min — ABNORMAL LOW (ref >90)

## 2013-02-05 LAB — CBC
HCT: 28.7 % — ABNORMAL LOW (ref 36.0–46.0)
MCH: 28.7 pg (ref 26.0–34.0)
MCV: 82.5 fL (ref 78.0–100.0)
Platelets: 159 10*3/uL (ref 150–400)
RDW: 14.1 % (ref 11.5–15.5)
WBC: 6.2 10*3/uL (ref 4.0–10.5)

## 2013-02-05 NOTE — Progress Notes (Signed)
Patient discharge instructions gone over. Home medications gone over. Drain care, incisional care, signs and symptoms of infections gone over. Diet, activity, and follow up appointments discussed. Information sheet on drain care and port a cath booklet given to patient. Prescription for pain medication given to patient. My chart instructions gone over and encouraged patient to sign up via the internet. Patient verbalized understanding of instructions.

## 2013-02-05 NOTE — Discharge Summary (Signed)
Physician Discharge Summary  Patient ID: Kristi [name] MRN: [id number] DOB/AGE: [birth year] 57 y.o.  Admit date: 02/04/2013 Discharge date: 02/05/2013  Admission Diagnoses: Left breast cancer  Discharge Diagnoses:  Same  Discharged Condition: stable  Hospital Course:  Pt admitted to hospital following left needle localized lumpectomy with axillary lymph node dissection.  Her drain output was minimal.  She had good pain control with oral narcotics.  She was able to void and ambulate.  Her daughter and she were able to demonstrate drain care.  She was discharged to home in stable condition.    Consults: None  Significant Diagnostic Studies: none.    Treatments: surgery: see above.    Discharge Exam: Blood pressure 94/52, pulse 60, temperature 98.3 F (36.8 C), temperature source Oral, resp. rate 20, height 5\' 3"  (1.6 m), weight 166 lb 14.4 oz (75.705 kg), SpO2 98.00%. General appearance: alert, cooperative and no distress Breasts: approp tender.  drain output serosanguinous. Cardio: regular rate and rhythm Extremities: extremities normal, atraumatic, no cyanosis or edema  Disposition: 01-Home or Self Care  Discharge Orders   Future Appointments Provider Department Dept Phone   02/24/2013 10:00 AM [facility] [phone]   02/28/2013 2:30 PM Mauri [facility] [phone]   02/28/2013 3:00 PM Victorino December, MD [facility] MEDICAL ONCOLOGY [phone]   04/14/2013 2:00 PM Baltazar Najjar [facility] MED[facility]COL[facility]Y [phone]   04/14/2013 3:00 PM Delcie Roch [facility] [phone]   Future Orders Complete By Expires     Call MD for:  difficulty breathing, headache or visual disturbances  As directed     Call MD for:  persistant nausea and vomiting  As directed     Call MD for:  redness, tenderness, or signs of  infection (pain, swelling, redness, odor or green/yellow discharge around incision site)  As directed     Call MD for:  severe uncontrolled pain  As directed     Call MD for:  temperature >100.4  As directed     Diet - low sodium heart healthy  As directed     Discharge instructions  As directed     Comments:      Measure and record drain output 1-2 x per day.  Bring record to clinic.    Increase activity slowly  As directed         Medication List    TAKE these medications       ALPRAZolam 1 MG tablet  Commonly known as:  XANAX  Take 1 mg by mouth at bedtime as needed for sleep.     amphetamine-dextroamphetamine 10 MG tablet  Commonly known as:  ADDERALL  Take 20 mg by mouth daily.     cholecalciferol 1000 UNITS tablet  Commonly known as:  VITAMIN D  Take 1,000 Units by mouth daily.     escitalopram 20 MG tablet  Commonly known as:  LEXAPRO  Take 20 mg by mouth daily.     esomeprazole 40 MG capsule  Commonly known as:  NEXIUM  Take 40 mg by mouth daily as needed. For reflux     FISH OIL PO  Take 1 capsule by mouth daily.     oxyCODONE-acetaminophen 5-325 MG per tablet  Commonly known as:  PERCOCET/ROXICET  Take 1-2 tablets by mouth every 4 (four) hours as needed.     valsartan-hydrochlorothiazide 320-25  MG per tablet  Commonly known as:  DIOVAN-HCT  Take 1 tablet by mouth daily.     vitamin B-12 1000 MCG tablet  Commonly known as:  CYANOCOBALAMIN  Take 1,000 mcg by mouth daily.     vitamin E 400 UNIT capsule  Take 400 Units by mouth daily.           Follow-up Information   Follow up with [facility], MD. Schedule an appointment as soon as possible for a visit in 2 weeks.   Contact information:   [address] [place] [address] [phone]       Signed: Almond Lint 02/05/2013, 9:09 AM

## 2013-02-05 NOTE — Telephone Encounter (Signed)
Daughter called in to schedule PO appt.  Explained to daughter the criteria for having drain pulled.  Daughter states understanding at this time and will call back to make appt for drain to be pulled.

## 2013-02-06 ENCOUNTER — Encounter (HOSPITAL_COMMUNITY): Payer: Self-pay | Admitting: General Surgery

## 2013-02-06 ENCOUNTER — Telehealth: Payer: Self-pay | Admitting: Oncology

## 2013-02-06 NOTE — Telephone Encounter (Signed)
S/w pt dtr re echo appt for 4/14 @ 10am. Also confirmed 4/21 and 4/25 appts.

## 2013-02-07 ENCOUNTER — Telehealth (INDEPENDENT_AMBULATORY_CARE_PROVIDER_SITE_OTHER): Payer: Self-pay | Admitting: General Surgery

## 2013-02-07 NOTE — Telephone Encounter (Signed)
Discussed pathology with patient.  Pt doing well.  Will set up follow up appt.

## 2013-02-17 ENCOUNTER — Ambulatory Visit (HOSPITAL_COMMUNITY)
Admission: RE | Admit: 2013-02-17 | Discharge: 2013-02-17 | Disposition: A | Discharge status: Home / Self Care | Payer: BC Managed Care – PPO | Source: Ambulatory Visit | Attending: Oncology | Admitting: Oncology

## 2013-02-17 ENCOUNTER — Encounter (INDEPENDENT_AMBULATORY_CARE_PROVIDER_SITE_OTHER): Payer: Self-pay | Admitting: General Surgery

## 2013-02-17 ENCOUNTER — Ambulatory Visit (INDEPENDENT_AMBULATORY_CARE_PROVIDER_SITE_OTHER): Payer: BC Managed Care – PPO | Admitting: General Surgery

## 2013-02-17 ENCOUNTER — Encounter (INDEPENDENT_AMBULATORY_CARE_PROVIDER_SITE_OTHER): Payer: BC Managed Care – PPO | Admitting: General Surgery

## 2013-02-17 VITALS — BP 110/68 | HR 68 | Temp 98.6°F | Resp 18 | Ht 62.0 in | Wt 162.8 lb

## 2013-02-17 DIAGNOSIS — C50919 Malignant neoplasm of unspecified site of unspecified female breast: Secondary | ICD-10-CM | POA: Insufficient documentation

## 2013-02-17 DIAGNOSIS — Z01818 Encounter for other preprocedural examination: Secondary | ICD-10-CM | POA: Insufficient documentation

## 2013-02-17 DIAGNOSIS — C50212 Malignant neoplasm of upper-inner quadrant of left female breast: Secondary | ICD-10-CM

## 2013-02-17 DIAGNOSIS — C50219 Malignant neoplasm of upper-inner quadrant of unspecified female breast: Secondary | ICD-10-CM

## 2013-02-17 DIAGNOSIS — Z79899 Other long term (current) drug therapy: Secondary | ICD-10-CM

## 2013-02-17 NOTE — Patient Instructions (Signed)
Call if fluid builds up again.  OK to shower.  Keep drain site covered until it stops draining.    Continue range of motion exercises.    Follow up in 3 months.

## 2013-02-17 NOTE — Assessment & Plan Note (Signed)
Pt doing well.  Follow up in 3 months.  ABC class Pt to see Dr. Roselind Messier and Welton Flakes.  To get chemo.

## 2013-02-17 NOTE — Progress Notes (Signed)
  Echocardiogram 2D Echocardiogram has been performed.  Ellender Hose A 02/17/2013, 1:00 PM

## 2013-02-17 NOTE — Progress Notes (Signed)
HISTORY: Pt is 2-3 weeks s/p left needle loc lumpectomy and ALND.  She is doing well.  Her range of motion is improving.  She is only taking occasional pain pill.      EXAM: General:  Alert and oriented Incision:  Healing well.  Drain removed.     PATHOLOGY: Diagnosis 1. Breast, lumpectomy, Left - MULTIFOCAL INVASIVE GRADE III CARCINOMA, THREE FOCI MEASURING 1.6 CM, 1.3 CM AND 0.2 CM - INVASIVE CARCINOMA IS LESS THAN 0.1 CM FROM MEDIAL MARGIN ON LUMPECTOMY SPECIMEN. - OTHER MARGINS ARE NEGATIVE. - SEE ONCOLOGY TEMPLATE. 2. Breast, excision, Left, additional medial margin - BENIGN BREAST PARENCHYMA WITH FIBROCYTIC CHANGES AND FAT NECROSIS. - NO ATYPIA, HYPERPLASIA, OR MALIGNANCY IDENTIFIED.  3. Lymph nodes, regional resection, Axillary - ONE LYMPH NODE POSITIVE FOR METASTATIC DUCTAL CARCINOMA (1/1). - EIGHTEEN ADDITIONAL LYMPH NODES WITH NO TUMOR SEEN (0/18).   ASSESSMENT AND PLAN:   Cancer of upper-inner quadrant of female breast Pt doing well.  Follow up in 3 months.  ABC class Pt to see Dr. Roselind Messier and Welton Flakes.  To get chemo.        Kristi Diego, MD Surgical Oncology, General & Endocrine Surgery [facility]  Altamese Savoy, MD Altamese Rutledge, MD

## 2013-02-19 ENCOUNTER — Telehealth (INDEPENDENT_AMBULATORY_CARE_PROVIDER_SITE_OTHER): Payer: Self-pay

## 2013-02-19 NOTE — Telephone Encounter (Signed)
LMOV for pt to call.  She was seen on 02/17/13 by Dr. Donell Beers.  She has another appt in the system for 02/25/13.  Want to confirm that she is not having a problem before cancelling that appt.

## 2013-02-20 NOTE — Telephone Encounter (Signed)
Patient states she doesn't need the 4/22 appt it was an appt made before her appt on 4/14

## 2013-02-21 ENCOUNTER — Telehealth: Payer: Self-pay | Admitting: *Deleted

## 2013-02-21 NOTE — Telephone Encounter (Signed)
Had pt on my cancellation list and saw there was an opening.  Called pt to offer 5/1 appt to her and she accepted.  Confirmed 03/06/13 appt w/ pt.

## 2013-02-24 ENCOUNTER — Other Ambulatory Visit: Payer: BC Managed Care – PPO

## 2013-02-25 ENCOUNTER — Encounter (INDEPENDENT_AMBULATORY_CARE_PROVIDER_SITE_OTHER): Payer: BC Managed Care – PPO | Admitting: General Surgery

## 2013-02-25 ENCOUNTER — Telehealth (INDEPENDENT_AMBULATORY_CARE_PROVIDER_SITE_OTHER): Payer: Self-pay

## 2013-02-26 NOTE — Telephone Encounter (Signed)
Patient called and said she had a small hard knot where incision is. I told her she could be forming some scar tissue I told her she should massage the area and try and stretch the area out if it does not hurt. She will try and do this and keep an eye on it and if symptoms worsen we may need to refer her to PT. She will call back if symptoms do not get better.

## 2013-02-27 ENCOUNTER — Other Ambulatory Visit: Payer: Self-pay | Admitting: Medical Oncology

## 2013-02-27 DIAGNOSIS — C50212 Malignant neoplasm of upper-inner quadrant of left female breast: Secondary | ICD-10-CM

## 2013-02-28 ENCOUNTER — Encounter: Payer: Self-pay | Admitting: Oncology

## 2013-02-28 ENCOUNTER — Other Ambulatory Visit (HOSPITAL_BASED_OUTPATIENT_CLINIC_OR_DEPARTMENT_OTHER): Payer: BC Managed Care – PPO | Admitting: Lab

## 2013-02-28 ENCOUNTER — Ambulatory Visit (HOSPITAL_BASED_OUTPATIENT_CLINIC_OR_DEPARTMENT_OTHER): Payer: BC Managed Care – PPO | Admitting: Oncology

## 2013-02-28 ENCOUNTER — Telehealth: Payer: Self-pay | Admitting: *Deleted

## 2013-02-28 VITALS — BP 119/73 | HR 70 | Temp 98.3°F | Resp 20 | Ht 62.0 in | Wt 159.6 lb

## 2013-02-28 DIAGNOSIS — C50219 Malignant neoplasm of upper-inner quadrant of unspecified female breast: Secondary | ICD-10-CM

## 2013-02-28 DIAGNOSIS — C50212 Malignant neoplasm of upper-inner quadrant of left female breast: Secondary | ICD-10-CM

## 2013-02-28 DIAGNOSIS — Z171 Estrogen receptor negative status [ER-]: Secondary | ICD-10-CM

## 2013-02-28 DIAGNOSIS — C773 Secondary and unspecified malignant neoplasm of axilla and upper limb lymph nodes: Secondary | ICD-10-CM

## 2013-02-28 LAB — CBC WITH DIFFERENTIAL/PLATELET
BASO%: 0.4 % (ref 0.0–2.0)
EOS%: 1.9 % (ref 0.0–7.0)
MCH: 28.1 pg (ref 25.1–34.0)
MCHC: 32.7 g/dL (ref 31.5–36.0)
MONO%: 6.7 % (ref 0.0–14.0)
NEUT%: 45.7 % (ref 38.4–76.8)
RDW: 13.7 % (ref 11.2–14.5)
lymph#: 3.3 10*3/uL (ref 0.9–3.3)

## 2013-02-28 LAB — COMPREHENSIVE METABOLIC PANEL (CC13)
ALT: 21 U/L (ref 0–55)
AST: 27 U/L (ref 5–34)
Alkaline Phosphatase: 69 U/L (ref 40–150)
Calcium: 10 mg/dL (ref 8.4–10.4)
Chloride: 100 mEq/L (ref 98–107)
Creatinine: 1 mg/dL (ref 0.6–1.1)
Potassium: 3.1 mEq/L — ABNORMAL LOW (ref 3.5–5.1)

## 2013-02-28 MED ORDER — LIDOCAINE-PRILOCAINE 2.5-2.5 % EX CREA: TOPICAL_CREAM | CUTANEOUS | Status: DC | PRN

## 2013-02-28 MED ORDER — DEXAMETHASONE 4 MG PO TABS: ORAL_TABLET | ORAL | Status: DC

## 2013-02-28 MED ORDER — PROCHLORPERAZINE MALEATE 10 MG PO TABS: 10.0000 mg | ORAL_TABLET | Freq: Four times a day (QID) | ORAL | Status: DC | PRN

## 2013-02-28 MED ORDER — PROCHLORPERAZINE 25 MG RE SUPP: 25.0000 mg | Freq: Two times a day (BID) | RECTAL | Status: DC | PRN

## 2013-02-28 MED ORDER — LORAZEPAM 0.5 MG PO TABS: 0.5000 mg | ORAL_TABLET | Freq: Four times a day (QID) | ORAL | Status: DC | PRN

## 2013-02-28 MED ORDER — ONDANSETRON HCL 8 MG PO TABS: 8.0000 mg | ORAL_TABLET | Freq: Two times a day (BID) | ORAL | Status: DC | PRN

## 2013-02-28 NOTE — Patient Instructions (Addendum)
Proceed with chemotherapy on 5/1 [facility])   We will see you on 5/1

## 2013-02-28 NOTE — Telephone Encounter (Signed)
Per staff phone call and POF I have schedueld appts.  JMW  

## 2013-02-28 NOTE — Telephone Encounter (Signed)
appts made and printed. Made aware that her ov and labs are missing for 5/8, 5/15, 5/22, and 6/5. I also made her aware that i emailed KK and [facility] asking for time slots for those days and when i received them i would give her a call. Pt is ok with that...td

## 2013-03-01 ENCOUNTER — Other Ambulatory Visit: Payer: Self-pay | Admitting: Obstetrics

## 2013-03-03 ENCOUNTER — Telehealth: Payer: Self-pay | Admitting: *Deleted

## 2013-03-03 NOTE — Telephone Encounter (Signed)
sw pt gv appts d/t . Made pt aware that Mardella Layman apologized due to she only had opening for Wednesdays. Pt is aware and plan to get a print out on this Thursday.Marland KitchenMarland KitchenTD

## 2013-03-04 ENCOUNTER — Encounter: Payer: Self-pay | Admitting: *Deleted

## 2013-03-04 ENCOUNTER — Telehealth: Payer: Self-pay | Admitting: Medical Oncology

## 2013-03-04 MED ORDER — POTASSIUM CHLORIDE CRYS ER 20 MEQ PO TBCR: 20.0000 meq | EXTENDED_RELEASE_TABLET | Freq: Every day | ORAL | Status: DC

## 2013-03-04 NOTE — Telephone Encounter (Signed)
Message copied by Rexene Edison on Tue Mar 04, 2013  3:08 PM ------      Message from: Laural Golden      Created: Tue Mar 04, 2013 10:01 AM       Patient's potassium level is low. Please call in K dur 20 meq po daily x 7 days, disp 7 no refill. Please call patient and inform her of this.  Thanks L      ----- Message -----         From: [facility]         Sent: 02/28/2013   2:30 PM           To: Victorino December, MD                   ------

## 2013-03-04 NOTE — Telephone Encounter (Signed)
Per NP, called pt to inform her of her potassium level being low @ 3.1. LVMOM with pt that NP would like for her to take Kdur 20 meq tablet daily, by mouth, for 7 days. Prescription sent to pt's listed pharmacy. Patient to call with any questions or concerns.

## 2013-03-04 NOTE — Progress Notes (Signed)
Mailed after appt letter to pt. 

## 2013-03-06 ENCOUNTER — Ambulatory Visit (HOSPITAL_BASED_OUTPATIENT_CLINIC_OR_DEPARTMENT_OTHER): Payer: BC Managed Care – PPO

## 2013-03-06 ENCOUNTER — Other Ambulatory Visit: Payer: BC Managed Care – PPO | Admitting: Lab

## 2013-03-06 ENCOUNTER — Other Ambulatory Visit (HOSPITAL_BASED_OUTPATIENT_CLINIC_OR_DEPARTMENT_OTHER): Payer: BC Managed Care – PPO | Admitting: Lab

## 2013-03-06 ENCOUNTER — Telehealth: Payer: Self-pay | Admitting: Oncology

## 2013-03-06 ENCOUNTER — Ambulatory Visit (HOSPITAL_BASED_OUTPATIENT_CLINIC_OR_DEPARTMENT_OTHER): Payer: BC Managed Care – PPO | Admitting: Oncology

## 2013-03-06 ENCOUNTER — Encounter: Payer: Self-pay | Admitting: Oncology

## 2013-03-06 ENCOUNTER — Ambulatory Visit: Payer: BC Managed Care – PPO | Admitting: Genetic Counselor

## 2013-03-06 VITALS — BP 124/71 | HR 58 | Temp 98.0°F | Resp 20 | Ht 62.0 in | Wt 162.1 lb

## 2013-03-06 DIAGNOSIS — C50212 Malignant neoplasm of upper-inner quadrant of left female breast: Secondary | ICD-10-CM

## 2013-03-06 DIAGNOSIS — C50219 Malignant neoplasm of upper-inner quadrant of unspecified female breast: Secondary | ICD-10-CM

## 2013-03-06 DIAGNOSIS — Z5111 Encounter for antineoplastic chemotherapy: Secondary | ICD-10-CM

## 2013-03-06 LAB — COMPREHENSIVE METABOLIC PANEL (CC13)
Albumin: 3.7 g/dL (ref 3.5–5.0)
Alkaline Phosphatase: 71 U/L (ref 40–150)
BUN: 11.7 mg/dL (ref 7.0–26.0)
CO2: 32 mEq/L — ABNORMAL HIGH (ref 22–29)
Calcium: 10.3 mg/dL (ref 8.4–10.4)
Glucose: 95 mg/dl (ref 70–99)
Potassium: 3.4 mEq/L — ABNORMAL LOW (ref 3.5–5.1)

## 2013-03-06 LAB — CBC WITH DIFFERENTIAL/PLATELET
BASO%: 0.3 % (ref 0.0–2.0)
Basophils Absolute: 0 10*3/uL (ref 0.0–0.1)
EOS%: 2.3 % (ref 0.0–7.0)
HCT: 34.8 % (ref 34.8–46.6)
LYMPH%: 43.4 % (ref 14.0–49.7)
MCH: 28.8 pg (ref 25.1–34.0)
MCHC: 33.4 g/dL (ref 31.5–36.0)
MCV: 86.3 fL (ref 79.5–101.0)
MONO%: 5.6 % (ref 0.0–14.0)
NEUT%: 48.4 % (ref 38.4–76.8)
lymph#: 2.9 10*3/uL (ref 0.9–3.3)

## 2013-03-06 MED ORDER — SODIUM CHLORIDE 0.9 % IV SOLN
Freq: Once | INTRAVENOUS | Status: AC
  Administered 2013-03-06: 15:00:00 via INTRAVENOUS

## 2013-03-06 MED ORDER — SODIUM CHLORIDE 0.9 % IJ SOLN
10.0000 mL | INTRAMUSCULAR | Status: DC | PRN
  Administered 2013-03-06: 10 mL
  Filled 2013-03-06: qty 10

## 2013-03-06 MED ORDER — DEXAMETHASONE SODIUM PHOSPHATE 20 MG/5ML IJ SOLN
12.0000 mg | Freq: Once | INTRAMUSCULAR | Status: AC
  Administered 2013-03-06: 12 mg via INTRAVENOUS
  Filled 2013-03-06: qty 5

## 2013-03-06 MED ORDER — LORAZEPAM 2 MG/ML IJ SOLN
0.5000 mg | Freq: Once | INTRAMUSCULAR | Status: AC
  Administered 2013-03-06: 0.5 mg via INTRAVENOUS

## 2013-03-06 MED ORDER — SODIUM CHLORIDE 0.9 % IV SOLN
600.0000 mg/m2 | Freq: Once | INTRAVENOUS | Status: AC
  Administered 2013-03-06: 1060 mg via INTRAVENOUS
  Filled 2013-03-06: qty 53

## 2013-03-06 MED ORDER — DOXORUBICIN HCL CHEMO IV INJECTION 2 MG/ML
60.0000 mg/m2 | Freq: Once | INTRAVENOUS | Status: AC
  Administered 2013-03-06: 106 mg via INTRAVENOUS
  Filled 2013-03-06: qty 53

## 2013-03-06 MED ORDER — SODIUM CHLORIDE 0.9 % IV SOLN
150.0000 mg | Freq: Once | INTRAVENOUS | Status: AC
  Administered 2013-03-06: 150 mg via INTRAVENOUS
  Filled 2013-03-06: qty 5

## 2013-03-06 MED ORDER — HEPARIN SOD (PORK) LOCK FLUSH 100 UNIT/ML IV SOLN
500.0000 [IU] | Freq: Once | INTRAVENOUS | Status: AC | PRN
  Administered 2013-03-06: 500 [IU]
  Filled 2013-03-06: qty 5

## 2013-03-06 MED ORDER — PALONOSETRON HCL INJECTION 0.25 MG/5ML
0.2500 mg | Freq: Once | INTRAVENOUS | Status: AC
  Administered 2013-03-06: 0.25 mg via INTRAVENOUS

## 2013-03-06 NOTE — Patient Instructions (Addendum)
[facility] Discharge Instructions for Patients Receiving Chemotherapy  Today you received the following chemotherapy agents : Adriamycin and Cytoxan.  To help prevent nausea and vomiting after your treatment, we encourage you to take your nausea medication.    If you develop nausea and vomiting that is not controlled by your nausea medication, call the clinic.  BELOW ARE SYMPTOMS THAT SHOULD BE REPORTED IMMEDIATELY:  *FEVER GREATER THAN 100.5 F  *CHILLS WITH OR WITHOUT FEVER  NAUSEA AND VOMITING THAT IS NOT CONTROLLED WITH YOUR NAUSEA MEDICATION  *UNUSUAL SHORTNESS OF BREATH  *UNUSUAL BRUISING OR BLEEDING  TENDERNESS IN MOUTH AND THROAT WITH OR WITHOUT PRESENCE OF ULCERS  *URINARY PROBLEMS  *BOWEL PROBLEMS  UNUSUAL RASH Items with * indicate a potential emergency and should be followed up as soon as possible.  One of the nurses will contact you 24 hours after your treatment. Please let the nurse know about any problems that you may have experienced. Feel free to call the clinic you have any questions or concerns. The clinic phone number is [phone].

## 2013-03-06 NOTE — Patient Instructions (Addendum)
Proceed with chemotherapy today  We discussed how to take your medications at home  We will see you back on 5/7

## 2013-03-06 NOTE — Telephone Encounter (Signed)
No new orders. Per 5/1 pof pt to f/u as scheduled.

## 2013-03-07 ENCOUNTER — Telehealth: Payer: Self-pay | Admitting: *Deleted

## 2013-03-07 ENCOUNTER — Ambulatory Visit (HOSPITAL_BASED_OUTPATIENT_CLINIC_OR_DEPARTMENT_OTHER): Payer: BC Managed Care – PPO

## 2013-03-07 ENCOUNTER — Telehealth: Payer: Self-pay | Admitting: Medical Oncology

## 2013-03-07 ENCOUNTER — Encounter: Payer: Self-pay | Admitting: Genetic Counselor

## 2013-03-07 VITALS — BP 115/60 | HR 57 | Temp 98.4°F

## 2013-03-07 DIAGNOSIS — C50219 Malignant neoplasm of upper-inner quadrant of unspecified female breast: Secondary | ICD-10-CM

## 2013-03-07 DIAGNOSIS — Z5189 Encounter for other specified aftercare: Secondary | ICD-10-CM

## 2013-03-07 DIAGNOSIS — C50212 Malignant neoplasm of upper-inner quadrant of left female breast: Secondary | ICD-10-CM

## 2013-03-07 MED ORDER — PEGFILGRASTIM INJECTION 6 MG/0.6ML
6.0000 mg | Freq: Once | SUBCUTANEOUS | Status: AC
  Administered 2013-03-07: 6 mg via SUBCUTANEOUS
  Filled 2013-03-07: qty 0.6

## 2013-03-07 NOTE — Telephone Encounter (Signed)
LVMOM. Per NP, informed patient that her potassium level is just a little low, at 3.4, and that NP would like to for pt to increase potassium rich foods in her diet. I listed off several foods for her that have potassium. Patient to call office with any questions or concerns.

## 2013-03-07 NOTE — Telephone Encounter (Signed)
Message copied by Rexene Edison on Fri Mar 07, 2013  9:08 AM ------      Message from: Laural Golden      Created: Fri Mar 07, 2013  8:52 AM       Patient has mildly low potassium and needs to increase potassium rich foods.  Please call and inform.            Thanks,       L      ----- Message -----         From: [facility]         Sent: 03/06/2013  12:17 PM           To: Victorino December, MD                   ------

## 2013-03-07 NOTE — Telephone Encounter (Signed)
Kaleiyah here with her daughter for her Neulasta injection following 1st [facility] chemotherapy treatment.  States she isn't having any nausea, vomiting, or diarrhea.  She is eating and drinking well.  All questions answered.  Knows to call if she has any problems or concerns.

## 2013-03-07 NOTE — Patient Instructions (Addendum)

## 2013-03-07 NOTE — Progress Notes (Signed)
Dr.  Drue Second requested a consultation for genetic counseling and risk assessment for Kristi [name], a 57 y.o. female, for discussion of her personal history of breast cancer and family history of multiple cancers. She presents to clinic today to discuss the possibility of a genetic predisposition to cancer, and to further clarify her risks, as well as her family members' risks for cancer.   HISTORY OF PRESENT ILLNESS: In 2014, at the age of 76, Kristi [name] was diagnosed with triple negative breast cancer. This was treated with a lumpectomy.  She is scheduled for chemotherapy and radiation.  The tumor is noted to be triple negative.   Past Medical History  Diagnosis Date  . Anxiety   . GERD (gastroesophageal reflux disease)   . Hypertension   . Breast cancer   . MRSA (methicillin resistant Staphylococcus aureus) 2009    Past Surgical History  Procedure Laterality Date  . Anal sphincterotomy  04/2011  . Hemorrhoid surgery  04/2011    ligation  . Breast lumpectomy with needle localization Left 02/04/2013    Procedure: LEFT BREAST LUMPECTOMY WITH NEEDLE LOCALIZATION;  Surgeon: Almond Lint, MD;  Location: [facility] OR;  Service: General;  Laterality: Left;  . Axillary lymph node dissection Left 02/04/2013    Procedure: LEFT AXILLARY LYMPH NODE DISSECTION;  Surgeon: Almond Lint, MD;  Location: [facility] OR;  Service: General;  Laterality: Left;  End: 1512  . Portacath placement Right 02/04/2013    Procedure: INSERTION PORT-A-CATH;  Surgeon: Almond Lint, MD;  Location: [facility] OR;  Service: General;  Laterality: Right;  Start Time: 1610.  Marland Kitchen Appendectomy  1980    History  Substance Use Topics  . Smoking status: Never Smoker   . Smokeless tobacco: Never Used  . Alcohol Use: No    REPRODUCTIVE HISTORY AND PERSONAL RISK ASSESSMENT FACTORS: Menarche was at age 61.   Perimenopausal Uterus Intact: Yes Ovaries Intact: Yes G1P1A0 , first live birth at age 62  She has not previously undergone treatment  for infertility.   OCP use for 35 years   She has not used HRT in the past.    FAMILY HISTORY:  We obtained a detailed, 4-generation family history.  Significant diagnoses are listed below: Family History  Problem Relation Age of Onset  . Lung cancer Father   . Hypertension Father   . Thyroid cancer Father     dx in his 24s  . Breast cancer Paternal Aunt 30  . Colon cancer Paternal Aunt     dx in her 50x  . Cervical cancer Paternal Aunt     dzx in her 57s  . Ovarian cancer Cousin     dx in her lage 7s  . Breast cancer Cousin     maternal first cousin, once removed; dx in her late 32s  . Breast cancer Cousin     maternal first cousin once removed; dx in late 11s  . Hypertension Mother   . Diabetes Mother   . Dementia Mother   . Hypertension Brother   . Seizures Brother     Alcohol induced.  . Cancer Paternal Uncle     oral cancer  . Kidney cancer Paternal Grandmother   . Cancer Cousin     several paternal cousins with brain cancer, leukemia, and other cancers    Patient's maternal ancestors are of [place] and [place] American descent, and paternal ancestors are of Caucasian and African American descent. There is no reported Ashkenazi Jewish ancestry. There is  no known consanguinity.  GENETIC COUNSELING RISK ASSESSMENT, DISCUSSION, AND SUGGESTED FOLLOW UP: We reviewed the natural history and genetic etiology of sporadic, familial and hereditary cancer syndromes.  About 5-10% of breast cancer is hereditary.  Of this, about 85% is the result of a BRCA1 or BRCA2 mutation. We discussed that a triple negative breast cancer has a higher proportion of individuals who have a BRCA mutation.  We reviewed the red flags of hereditary cancer syndromes and the dominant inheritance patterns.  If the BRCA testing is negative, we discussed that we could be testing for the wrong gene.  We discussed gene panels, and that several cancer genes that are associated with different cancers can be tested  at the same time.  Because of the different types of cancer that are in the patient's family, we will consider one of the panel tests if she is negative for BRCA mutations.   The patient's personal and family history of cancer is suggestive of the following possible diagnosis: hereditary cancer syndrome  We discussed that identification of a hereditary cancer syndrome may help her care providers tailor the patients medical management. If a mutation indicating a hereditary cancer syndrome is detected in this case, the [employer] recommendations would include increased cancer surveillance and possbile prophylactic surgery. If a mutation is detected, the patient will be referred back to the referring provider and to any additional appropriate care providers to discuss the relevant options.   If a mutation is not found in the patient, this will decrease the likelihood of a hereditary cancer syndrome as the explanation for her breast cancer. Cancer surveillance options would be discussed for the patient according to the appropriate standard National Comprehensive Cancer Network and American Cancer Society guidelines, with consideration of their personal and family history risk factors. In this case, the patient will be referred back to their care providers for discussions of management.   After considering the risks, benefits, and limitations, the patient provided informed consent for  the following  testing: BRCA1/2 and del/dup, reflexing to the Comprehensive Cancer Panel through [facility].   Per the patient's request, we will contact her by telephone to discuss these results. A follow up genetic counseling visit will be scheduled if indicated.  The patient was seen for a total of 60 minutes, greater than 50% of which was spent face-to-face counseling.  This plan is being carried out per Dr. Feliz Beam recommendations.  This note will also be sent to the referring provider via the  electronic medical record. The patient will be supplied with a summary of this genetic counseling discussion as well as educational information on the discussed hereditary cancer syndromes following the conclusion of their visit.   Patient was discussed with Dr. Drue Second.   _______________________________________________________________________ For Office Staff:  Number of people involved in session: 2 Was an Intern/ student involved with case: no }

## 2013-03-12 ENCOUNTER — Encounter: Payer: Self-pay | Admitting: Adult Health

## 2013-03-12 ENCOUNTER — Ambulatory Visit (HOSPITAL_BASED_OUTPATIENT_CLINIC_OR_DEPARTMENT_OTHER): Payer: BC Managed Care – PPO | Admitting: Adult Health

## 2013-03-12 ENCOUNTER — Other Ambulatory Visit (HOSPITAL_BASED_OUTPATIENT_CLINIC_OR_DEPARTMENT_OTHER): Payer: BC Managed Care – PPO | Admitting: Lab

## 2013-03-12 VITALS — BP 121/73 | HR 76 | Temp 98.3°F | Resp 20 | Ht 62.0 in | Wt 158.8 lb

## 2013-03-12 DIAGNOSIS — C50212 Malignant neoplasm of upper-inner quadrant of left female breast: Secondary | ICD-10-CM

## 2013-03-12 DIAGNOSIS — C50219 Malignant neoplasm of upper-inner quadrant of unspecified female breast: Secondary | ICD-10-CM

## 2013-03-12 DIAGNOSIS — D709 Neutropenia, unspecified: Secondary | ICD-10-CM

## 2013-03-12 DIAGNOSIS — D702 Other drug-induced agranulocytosis: Secondary | ICD-10-CM

## 2013-03-12 DIAGNOSIS — C773 Secondary and unspecified malignant neoplasm of axilla and upper limb lymph nodes: Secondary | ICD-10-CM

## 2013-03-12 LAB — COMPREHENSIVE METABOLIC PANEL (CC13)
Albumin: 3.4 g/dL — ABNORMAL LOW (ref 3.5–5.0)
Alkaline Phosphatase: 73 U/L (ref 40–150)
Chloride: 100 mEq/L (ref 98–107)
Glucose: 105 mg/dl — ABNORMAL HIGH (ref 70–99)
Potassium: 3.5 mEq/L (ref 3.5–5.1)
Sodium: 140 mEq/L (ref 136–145)
Total Protein: 6.9 g/dL (ref 6.4–8.3)

## 2013-03-12 LAB — CBC WITH DIFFERENTIAL/PLATELET
Basophils Absolute: 0 10*3/uL (ref 0.0–0.1)
EOS%: 5.6 % (ref 0.0–7.0)
Eosinophils Absolute: 0.1 10*3/uL (ref 0.0–0.5)
HGB: 11.4 g/dL — ABNORMAL LOW (ref 11.6–15.9)
MCH: 28.2 pg (ref 25.1–34.0)
MONO#: 0 10*3/uL — ABNORMAL LOW (ref 0.1–0.9)
NEUT#: 0.2 10*3/uL — CL (ref 1.5–6.5)
RDW: 13.4 % (ref 11.2–14.5)
WBC: 1.4 10*3/uL — ABNORMAL LOW (ref 3.9–10.3)
lymph#: 1.2 10*3/uL (ref 0.9–3.3)

## 2013-03-12 MED ORDER — CIPROFLOXACIN HCL 500 MG PO TABS: 500.0000 mg | ORAL_TABLET | Freq: Two times a day (BID) | ORAL | Status: DC

## 2013-03-12 NOTE — Progress Notes (Signed)
OFFICE PROGRESS NOTE  CC**  [name],[name], MD [address], [address] [place] [address]  DIAGNOSIS: 57 year old female with stage IIA ER weakly positive, PR negative HER-2/neu negative, invasive ductal carcinoma of the left breast.   PRIOR THERAPY: 1. Earlier this year it on routine screening mammography the patient was noted to have a abnormality within the 12:00 position of the left breast. Patient returned for additional imaging with 3-D, tomosynthesis. Within the left breast there were 2 masses somewhat lobulated with irregular margins in the upper inner aspect of the left breast. These were separated by approximately a centimeter. On ultrasound the lesion the 11:00 position measured 1.0 x 0.7 cm. The 12:00 lesion measured 1.6 x 0.9 cm. In addition within the axillary region there was a 2.1 x 1.7 suspicious appearing lymph node.. The patient proceeded to undergo ultrasound-guided biopsy of the 2 areas within the left breast as well as the suspicious left axillary lymph node. Biopsies from both breast areas revealed invasive ductal carcinoma. In addition the lymph node from the left axilla revealed ductal carcinoma. The lesions appeared to be high-grade. There was no HER-2/neu amplification and hormone staining was minimal consistent with essentially consistent with triple negative breast cancer.patient's case was discussed at the multidisciplinary breast conference her pathology and radiology were reviewed  2. Patient underwent lumpectomy of the left breast with Dr. Donell Beers on 02/04/13, a 1.6cm tumor was removed along with an axillary lymph node dissection that found 1/19 nodes positive for metastatic disease.    3. Patient was then started on adjuvant chemotherapy on 03/05/13 with Adriamycin/Cytoxan.   CURRENT THERAPY: Adriamycin Cytoxan Cycle 1 day 8  INTERVAL HISTORY: Kristi [name] 57 y.o. female returns for follow up today.  She is doing well.  She has not had any nausea with  this chemotherapy.  She does endorse taste changes, but her appetite and PO intake have remained.  She's been drinking 55-64 ounces of fluids per day.  She was constipated and this resolved with Dulcolax.  She went to the ABC class after surgery, she continues to do her exercises, and the tightness in her left arm is still there but improving.  She tolerated the Neualsta with mild aching.  She took claritin daily and did well with this.  Otherwise, a 10 point ROS is negative.   MEDICAL HISTORY: Past Medical History  Diagnosis Date  . Anxiety   . GERD (gastroesophageal reflux disease)   . Hypertension   . Breast cancer   . MRSA (methicillin resistant Staphylococcus aureus) 2009    ALLERGIES:  has No Known Allergies.  MEDICATIONS:  Current Outpatient Prescriptions  Medication Sig Dispense Refill  . ALPRAZolam (XANAX) 1 MG tablet Take 1 mg by mouth at bedtime as needed for sleep.       Marland Kitchen amphetamine-dextroamphetamine (ADDERALL) 10 MG tablet Take 20 mg by mouth daily.       Marland Kitchen dexamethasone (DECADRON) 4 MG tablet Take 2 tablets by mouth once a day on the day after chemotherapy and then take 2 tablets two times a day for 2 days. Take with food.  30 tablet  1  . escitalopram (LEXAPRO) 20 MG tablet Take 20 mg by mouth daily.        Marland Kitchen esomeprazole (NEXIUM) 40 MG capsule Take 40 mg by mouth daily as needed. For reflux      . lidocaine-prilocaine (EMLA) cream Apply topically as needed.  30 g  7  . LORazepam (ATIVAN) 0.5 MG  tablet Take 1 tablet (0.5 mg total) by mouth every 6 (six) hours as needed (Nausea or vomiting).  30 tablet  0  . Multiple Vitamin (MULTIVITAMIN) tablet Take 1 tablet by mouth daily.      . Omega-3 Fatty Acids (FISH OIL PO) Take 1 capsule by mouth daily.      . ondansetron (ZOFRAN) 8 MG tablet Take 1 tablet (8 mg total) by mouth 2 (two) times daily as needed. Take two times a day as needed for nausea or vomiting starting on the third day after chemotherapy.  30 tablet  1  . OVER  THE COUNTER MEDICATION Take by mouth as needed.      Marland Kitchen OVER THE COUNTER MEDICATION Take by mouth as needed. OTC stool softner, as needed      . oxyCODONE-acetaminophen (PERCOCET/ROXICET) 5-325 MG per tablet Take 1-2 tablets by mouth every 4 (four) hours as needed.  30 tablet  0  . potassium chloride SA (K-DUR,KLOR-CON) 20 MEQ tablet Take 1 tablet (20 mEq total) by mouth daily. For 7 days.  7 tablet  0  . prochlorperazine (COMPAZINE) 10 MG tablet Take 1 tablet (10 mg total) by mouth every 6 (six) hours as needed (Nausea or vomiting).  30 tablet  1  . prochlorperazine (COMPAZINE) 25 MG suppository Place 1 suppository (25 mg total) rectally every 12 (twelve) hours as needed for nausea.  12 suppository  3  . valsartan-hydrochlorothiazide (DIOVAN-HCT) 320-25 MG per tablet Take 1 tablet by mouth daily.        No current facility-administered medications for this visit.    SURGICAL HISTORY:  Past Surgical History  Procedure Laterality Date  . Anal sphincterotomy  04/2011  . Hemorrhoid surgery  04/2011    ligation  . Breast lumpectomy with needle localization Left 02/04/2013    Procedure: LEFT BREAST LUMPECTOMY WITH NEEDLE LOCALIZATION;  Surgeon: Almond Lint, MD;  Location: [facility] OR;  Service: General;  Laterality: Left;  . Axillary lymph node dissection Left 02/04/2013    Procedure: LEFT AXILLARY LYMPH NODE DISSECTION;  Surgeon: Almond Lint, MD;  Location: [facility] OR;  Service: General;  Laterality: Left;  End: 1512  . Portacath placement Right 02/04/2013    Procedure: INSERTION PORT-A-CATH;  Surgeon: Almond Lint, MD;  Location: [facility] OR;  Service: General;  Laterality: Right;  Start Time: 1610.  Marland Kitchen Appendectomy  1980    REVIEW OF SYSTEMS:   General: fatigue (-), night sweats (-), fever (-), pain (-) Lymph: palpable nodes (-) HEENT: vision changes (-), mucositis (-), gum bleeding (-), epistaxis (-) Cardiovascular: chest pain (-), palpitations (-) Pulmonary: shortness of breath (-), dyspnea on exertion (-),  cough (-), hemoptysis (-) GI:  Early satiety (-), melena (-), dysphagia (-), nausea/vomiting (-), diarrhea (-) GU: dysuria (-), hematuria (-), incontinence (-) Musculoskeletal: joint swelling (-), joint pain (-), back pain (-) Neuro: weakness (-), numbness (-), headache (-), confusion (-) Skin: Rash (-), lesions (-), dryness (-) Psych: depression (-), suicidal/homicidal ideation (-), feeling of hopelessness (-)   PHYSICAL EXAMINATION: Blood pressure 121/73, pulse 76, temperature 98.3 F (36.8 C), temperature source Oral, resp. rate 20, height 5\' 2"  (1.575 m), weight 158 lb 12.8 oz (72.031 kg). Body mass index is 29.04 kg/(m^2). General: Patient is a well appearing female in no acute distress HEENT: PERRLA, sclerae anicteric no conjunctival pallor, MMM Neck: supple, no palpable adenopathy Lungs: clear to auscultation bilaterally, no wheezes, rhonchi, or rales Cardiovascular: regular rate rhythm, S1, S2, no murmurs, rubs or gallops Abdomen: Soft, non-tender,  non-distended, normoactive bowel sounds, no HSM Extremities: warm and well perfused, no clubbing, cyanosis, or edema Skin: No rashes or lesions Neuro: Non-focal Breasts: left breast lumpectomy site no nodularity, healing well, scar tissue underneath axillary incision, no erythema, swelling, drainage, right breast no masses or nodularity.  ECOG PERFORMANCE STATUS: 1 - Symptomatic but completely ambulatory  LABORATORY DATA: Lab Results  Component Value Date   WBC 1.4 confirmed* 03/12/2013   HGB 11.4* 03/12/2013   HCT 33.7* 03/12/2013   MCV 83.4 03/12/2013   PLT 111 confirmed* 03/12/2013      Chemistry      Component Value Date/Time   NA 142 03/06/2013 1207   NA 137 02/05/2013 0505   K 3.4* 03/06/2013 1207   K 3.4* 02/05/2013 0505   CL 100 03/06/2013 1207   CL 102 02/05/2013 0505   CO2 32* 03/06/2013 1207   CO2 27 02/05/2013 0505   BUN 11.7 03/06/2013 1207   BUN 8 02/05/2013 0505   CREATININE 0.8 03/06/2013 1207   CREATININE 0.86 02/05/2013 0505       Component Value Date/Time   CALCIUM 10.3 03/06/2013 1207   CALCIUM 8.0* 02/05/2013 0505   ALKPHOS 71 03/06/2013 1207   AST 30 03/06/2013 1207   ALT 25 03/06/2013 1207   BILITOT 0.34 03/06/2013 1207       RADIOGRAPHIC STUDIES:  No results found.  ASSESSMENT: 57 year old female with new diagnosis of   #1 multifocal invasive ductal carcinoma grade 3 ER weakly positive at 3% PR negative HER-2/neu negative with Ki-67 of 90% tumor is very aggressive. She does have a palpable lymph node as well in the left axilla. Patient was seen in the multidisciplinary clinic for discussion of treatment options.   #2 patient underwent a lumpectomy with axillary lymph node dissection with Dr. Luz Brazen on 02/04/13. A 1.6 cm tumor was removed with 1/19 lymph nodes positive for disease.    #3 She was recommended adjuvant chemotherapy consisting of Adriamycin Cytoxan given dose dense for total of 4 cycles followed by Taxol carboplatinum weekly for 12 weeks. Rationale for this aggressive treatment was discussed with the patient since she is essentially has a triple-negative disease.   #4 certainly patient will need post lumpectomy radiation therapy she was seen by radiation oncology. This will begin after patient has completed her chemotherapy.   #5 we discussed role of genetics, and she had an appointment with Maylon Cos on 03/06/13.     PLAN:  1. Doing well.  Patient is neutropenic today.  I counseled the patient in detail regarding this, and she will start Cipro BID and call for any fevers, chills, or concerns.   2. We will see Ms. Droke back next week for cycle 2 of chemotherapy.    All questions were answered. The patient knows to call the clinic with any problems, questions or concerns. We can certainly see the patient much sooner if necessary.  I spent 25 minutes counseling the patient face to face. The total time spent in the appointment was 30 minutes.  Cherie Ouch Lyn Hollingshead, NP Medical Oncology [facility] Phone: [phone] 03/12/2013, 9:23 AM

## 2013-03-12 NOTE — Patient Instructions (Addendum)
Patient Neutropenia Instruction Sheet  Diagnosis: Breast Cancer      Treating Physician: Drue Second, MD  Treatment: 1. Type of chemotherapy: Adriamycin/Cytoxan 2. Date of last treatment: 03/05/13  Last Blood Counts: Lab Results  Component Value Date   WBC 1.4 confirmed* 03/12/2013   HGB 11.4* 03/12/2013   HCT 33.7* 03/12/2013   MCV 83.4 03/12/2013   PLT 111 confirmed* 03/12/2013  ANC 200      Prophylactic Antibiotics: Cipro 500 mg by mouth twice a day Instructions: 1. Monitor temperature and call if fever  greater than 100.5, chills, shaking chills (rigors) 2. Call Physician on-call at [phone] 3. Give him/her symptoms and list of medications that you are taking and your last blood count.   Hypokalemia Hypokalemia means a low potassium level in the blood.Potassium is an electrolyte that helps regulate the amount of fluid in the body. It also stimulates muscle contraction and maintains a stable acid-base balance.Most of the body's potassium is inside of cells, and only a very small amount is in the blood. Because the amount in the blood is so small, minor changes can have big effects. PREPARATION FOR TEST Testing for potassium requires taking a blood sample taken by needle from a vein in the arm. The skin is cleaned thoroughly before the sample is drawn. There is no other special preparation needed. NORMAL VALUES Potassium levels below 3.5 mEq/L are abnormally low. Levels above 5.1 mEq/L are abnormally high. Ranges for normal findings may vary among different laboratories and hospitals. You should always check with your doctor after having lab work or other tests done to discuss the meaning of your test results and whether your values are considered within normal limits. MEANING OF TEST  Your caregiver will go over the test results with you and discuss the importance and meaning of your results, as well as treatment options and the need for additional tests, if necessary. A  potassium level is frequently part of a routine medical exam. It is usually included as part of a whole "panel" of tests for several blood salts (such as Sodium and Chloride). It may be done as part of follow-up when a low potassium level was found in the past or other blood salts are suspected of being out of balance. A low potassium level might be suspected if you have one or more of the following: Symptoms of weakness. Abnormal heart rhythms. High blood pressure and are taking medication to control this, especially water pills (diuretics). Kidney disease that can affect your potassium level . Diabetes requiring the use of insulin. The potassium may fall after taking insulin, especially if the diabetes had been out of control for a while. A condition requiring the use of cortisone-type medication or certain types of antibiotics. Vomiting and/or diarrhea for more than a day or two. A stomach or intestinal condition that may not permit appropriate absorption of potassium. Fainting episodes. Mental confusion. OBTAINING TEST RESULTS It is your responsibility to obtain your test results. Ask the lab or department performing the test when and how you will get your results.  Please contact your caregiver directly if you have not received the results within one week. At that time, ask if there is anything different or new you should be doing in relation to the results. TREATMENT Hypokalemia can be treated with potassium supplements taken by mouth and/or adjustments in your current medications. A diet high in potassium is also helpful. Foods with high potassium content are: Peas, lentils, lima beans, nuts, and  dried fruit. Whole grain and bran cereals and breads. Fresh fruit, vegetables (bananas, cantaloupe, grapefruit, oranges, tomatoes, honeydew melons, potatoes). Orange and tomato juices. Meats. If potassium supplement has been prescribed for you today or your medications have been adjusted, see  your personal caregiver in time02 for a re-check. SEEK MEDICAL CARE IF: There is a feeling of worsening weakness. You experience repeated chest palpitations. You are diabetic and having difficulty keeping your blood sugars in the normal range. You are experiencing vomiting and/or diarrhea. You are having difficulty with any of your regular medications. SEEK IMMEDIATE MEDICAL CARE IF: You experience chest pain, shortness of breath, or episodes of dizziness. You have been having vomiting or diarrhea for more than 2 days. You have a fainting episode. MAKE SURE YOU:  Understand these instructions. Will watch your condition. Will get help right away if you are not doing well or get worse. Document Released: 10/23/2005 Document Revised: 01/15/2012 Document Reviewed: 10/03/2008 [facility] Patient Information 2013 [place], [place].

## 2013-03-14 ENCOUNTER — Telehealth: Payer: Self-pay | Admitting: Genetic Counselor

## 2013-03-14 NOTE — Telephone Encounter (Signed)
Revealed negative BRCA testing.  The Comprehensive cancer panel is pending.

## 2013-03-16 NOTE — Progress Notes (Signed)
OFFICE PROGRESS NOTE  CC  [name],[name], MD [address], [address] [place] [address]  DIAGNOSIS: 57 year old female with stage IIA ER weakly positive, PR negative HER-2/neu negative, invasive ductal carcinoma of the left breast.    PRIOR THERAPY: 1. Earlier this year it on routine screening mammography the patient was noted to have a abnormality within the 12:00 position of the left breast. Patient returned for additional imaging with 3-D, tomosynthesis. Within the left breast there were 2 masses somewhat lobulated with irregular margins in the upper inner aspect of the left breast. These were separated by approximately a centimeter. On ultrasound the lesion the 11:00 position measured 1.0 x 0.7 cm. The 12:00 lesion measured 1.6 x 0.9 cm. In addition within the axillary region there was a 2.1 x 1.7 suspicious appearing lymph node.. The patient proceeded to undergo ultrasound-guided biopsy of the 2 areas within the left breast as well as the suspicious left axillary lymph node. Biopsies from both breast areas revealed invasive ductal carcinoma. In addition the lymph node from the left axilla revealed ductal carcinoma. The lesions appeared to be high-grade. There was no HER-2/neu amplification and hormone staining was minimal consistent with essentially consistent with triple negative breast cancer.patient's case was discussed at the multidisciplinary breast conference her pathology and radiology were reviewed   2. Patient underwent lumpectomy of the left breast with Dr. Donell Beers on 02/04/13, a 1.6cm tumor was removed along with an axillary lymph node dissection that found 1/19 nodes positive for metastatic disease.   3. Patient was then started on adjuvant chemotherapy on 03/05/13 with Adriamycin/Cytoxan  CURRENT THERAPY:patient is here for cycle 1 day 1 of Adriamycin and Cytoxan  INTERVAL HISTORY: Kristi [name] 57 y.o. female returns for followup visit today. She has undergone a  lumpectomy on 02/04/2013 with the final pathology revealing a 1.6 cm tumor that was triple negative. Postoperatively she is doing.any complaints. She denies any fevers chills night sweats headaches shortness of breath chest pains palpitations no myalgias or arthralgias. She did have a positive lymph node for metastatic disease.  MEDICAL HISTORY: Past Medical History  Diagnosis Date  . Anxiety   . GERD (gastroesophageal reflux disease)   . Hypertension   . Breast cancer   . MRSA (methicillin resistant Staphylococcus aureus) 2009    ALLERGIES:  has No Known Allergies.  MEDICATIONS:  Current Outpatient Prescriptions  Medication Sig Dispense Refill  . ALPRAZolam (XANAX) 1 MG tablet Take 1 mg by mouth at bedtime as needed for sleep.       Marland Kitchen amphetamine-dextroamphetamine (ADDERALL) 10 MG tablet Take 20 mg by mouth daily.       Marland Kitchen escitalopram (LEXAPRO) 20 MG tablet Take 20 mg by mouth daily.        Marland Kitchen esomeprazole (NEXIUM) 40 MG capsule Take 40 mg by mouth daily as needed. For reflux      . Multiple Vitamin (MULTIVITAMIN) tablet Take 1 tablet by mouth daily.      . Omega-3 Fatty Acids (FISH OIL PO) Take 1 capsule by mouth daily.      Marland Kitchen OVER THE COUNTER MEDICATION Take by mouth as needed.      Marland Kitchen OVER THE COUNTER MEDICATION Take by mouth as needed. OTC stool softner, as needed      . valsartan-hydrochlorothiazide (DIOVAN-HCT) 320-25 MG per tablet Take 1 tablet by mouth daily.       . ciprofloxacin (CIPRO) 500 MG tablet Take 1 tablet (500 mg total) by mouth 2 (  two) times daily.  14 tablet  6  . dexamethasone (DECADRON) 4 MG tablet Take 2 tablets by mouth once a day on the day after chemotherapy and then take 2 tablets two times a day for 2 days. Take with food.  30 tablet  1  . lidocaine-prilocaine (EMLA) cream Apply topically as needed.  30 g  7  . LORazepam (ATIVAN) 0.5 MG tablet Take 1 tablet (0.5 mg total) by mouth every 6 (six) hours as needed (Nausea or vomiting).  30 tablet  0  .  ondansetron (ZOFRAN) 8 MG tablet Take 1 tablet (8 mg total) by mouth 2 (two) times daily as needed. Take two times a day as needed for nausea or vomiting starting on the third day after chemotherapy.  30 tablet  1  . oxyCODONE-acetaminophen (PERCOCET/ROXICET) 5-325 MG per tablet Take 1-2 tablets by mouth every 4 (four) hours as needed.  30 tablet  0  . potassium chloride SA (K-DUR,KLOR-CON) 20 MEQ tablet Take 1 tablet (20 mEq total) by mouth daily. For 7 days.  7 tablet  0  . prochlorperazine (COMPAZINE) 10 MG tablet Take 1 tablet (10 mg total) by mouth every 6 (six) hours as needed (Nausea or vomiting).  30 tablet  1  . prochlorperazine (COMPAZINE) 25 MG suppository Place 1 suppository (25 mg total) rectally every 12 (twelve) hours as needed for nausea.  12 suppository  3   No current facility-administered medications for this visit.    SURGICAL HISTORY:  Past Surgical History  Procedure Laterality Date  . Anal sphincterotomy  04/2011  . Hemorrhoid surgery  04/2011    ligation  . Breast lumpectomy with needle localization Left 02/04/2013    Procedure: LEFT BREAST LUMPECTOMY WITH NEEDLE LOCALIZATION;  Surgeon: Almond Lint, MD;  Location: [facility];  Service: General;  Laterality: Left;  . Axillary lymph node dissection Left 02/04/2013    Procedure: LEFT AXILLARY LYMPH NODE DISSECTION;  Surgeon: Almond Lint, MD;  Location: [facility] OR;  Service: General;  Laterality: Left;  End: 1512  . Portacath placement Right 02/04/2013    Procedure: INSERTION PORT-A-CATH;  Surgeon: Almond Lint, MD;  Location: [facility] OR;  Service: General;  Laterality: Right;  Start Time: 1610.  Marland Kitchen Appendectomy  1980    REVIEW OF SYSTEMS:  Pertinent items are noted in HPI.   HEALTH MAINTENANCE:  PHYSICAL EXAMINATION: Blood pressure 119/73, pulse 70, temperature 98.3 F (36.8 C), temperature source Oral, resp. rate 20, height 5\' 2"  (1.575 m), weight 159 lb 9.6 oz (72.394 kg), last menstrual period 01/20/2013. Body mass index is 29.18  kg/(m^2). ECOG PERFORMANCE STATUS: 0 - Asymptomatic Well-developed well-nourished female in no acute distress HEENT exam EOMI PERRLA sclerae anicteric no conjunctival pallor oral mucosa is moist neck is supple Lungs clear to auscultation Cardiovascular regular rate rhythm Abdomen soft nontender nondistended bowel sounds are present no HSM Extremities no edema Neuro patient's alert oriented otherwise nonfocal Left lumpectomy site looks well-healed no redness no tenderness right breast no masses or nipple discharge.    LABORATORY DATA: Lab Results  Component Value Date   WBC 1.4 confirmed* 03/12/2013   HGB 11.4* 03/12/2013   HCT 33.7* 03/12/2013   MCV 83.4 03/12/2013   PLT 111 confirmed* 03/12/2013      Chemistry      Component Value Date/Time   NA 140 03/12/2013 0853   NA 137 02/05/2013 0505   K 3.5 03/12/2013 0853   K 3.4* 02/05/2013 0505   CL 100 03/12/2013 9604  CL 102 02/05/2013 0505   CO2 29 03/12/2013 0853   CO2 27 02/05/2013 0505   BUN 17.4 03/12/2013 0853   BUN 8 02/05/2013 0505   CREATININE 0.8 03/12/2013 0853   CREATININE 0.86 02/05/2013 0505      Component Value Date/Time   CALCIUM 9.5 03/12/2013 0853   CALCIUM 8.0* 02/05/2013 0505   ALKPHOS 73 03/12/2013 0853   AST 20 03/12/2013 0853   ALT 25 03/12/2013 0853   BILITOT 0.81 03/12/2013 0853       RADIOGRAPHIC STUDIES:  No results found.  ASSESSMENT: 57 year old female with  #1 stage II invasive ductal carcinoma of the left breast status post lumpectomy. The tumor was node +1 of 19 lymph nodes positive for metastatic disease triple negative. Postoperatively she is doing well.  #2 recommendation is to proceed with chemotherapy consisting of Adriamycin Cytoxan given dose dense with day 2 Neulasta. She will receive 4 cycles of this. This will then be followed by 12 weeks of Taxol and carboplatinum. Risks and benefits of treatment and rationale were discussed with the patient and her family.   PLAN:    #1 proceed with adjuvant chemotherapy  cycle 1 day 1.  #2 she will return in one week's time for followup   All questions were answered. The patient knows to call the clinic with any problems, questions or concerns. We can certainly see the patient much sooner if necessary.  I spent 25 minutes counseling the patient face to face. The total time spent in the appointment was 30 minutes.    Drue Second, MD Medical/Oncology [facility] [phone] (beeper) [phone] (Office)

## 2013-03-16 NOTE — Progress Notes (Signed)
OFFICE PROGRESS NOTE  CC  [name],[name], MD [address], [address] [place] [address]  DIAGNOSIS: 57 year old female with stage IIA ER weakly positive, PR negative HER-2/neu negative, invasive ductal carcinoma of the left breast.    PRIOR THERAPY: 1. Earlier this year it on routine screening mammography the patient was noted to have a abnormality within the 12:00 position of the left breast. Patient returned for additional imaging with 3-D, tomosynthesis. Within the left breast there were 2 masses somewhat lobulated with irregular margins in the upper inner aspect of the left breast. These were separated by approximately a centimeter. On ultrasound the lesion the 11:00 position measured 1.0 x 0.7 cm. The 12:00 lesion measured 1.6 x 0.9 cm. In addition within the axillary region there was a 2.1 x 1.7 suspicious appearing lymph node.. The patient proceeded to undergo ultrasound-guided biopsy of the 2 areas within the left breast as well as the suspicious left axillary lymph node. Biopsies from both breast areas revealed invasive ductal carcinoma. In addition the lymph node from the left axilla revealed ductal carcinoma. The lesions appeared to be high-grade. There was no HER-2/neu amplification and hormone staining was minimal consistent with essentially consistent with triple negative breast cancer.patient's case was discussed at the multidisciplinary breast conference her pathology and radiology were reviewed   2. Patient underwent lumpectomy of the left breast with Dr. Donell Beers on 02/04/13, a 1.6cm tumor was removed along with an axillary lymph node dissection that found 1/19 nodes positive for metastatic disease.   3. Patient was then started on adjuvant chemotherapy on 03/05/13 with Adriamycin/Cytoxan  CURRENT THERAPY:patient is here for cycle 1 day 1 of Adriamycin and Cytoxan  INTERVAL HISTORY: Kristi [name] 57 y.o. female returns for followup visit today. She has undergone a  lumpectomy on 02/04/2013 with the final pathology revealing a 1.6 cm tumor that was triple negative. Postoperatively she is doing.any complaints. She denies any fevers chills night sweats headaches shortness of breath chest pains palpitations no myalgias or arthralgias. She did have a positive lymph node for metastatic disease.  MEDICAL HISTORY: Past Medical History  Diagnosis Date  . Anxiety   . GERD (gastroesophageal reflux disease)   . Hypertension   . Breast cancer   . MRSA (methicillin resistant Staphylococcus aureus) 2009    ALLERGIES:  has No Known Allergies.  MEDICATIONS:  Current Outpatient Prescriptions  Medication Sig Dispense Refill  . ALPRAZolam (XANAX) 1 MG tablet Take 1 mg by mouth at bedtime as needed for sleep.       Marland Kitchen amphetamine-dextroamphetamine (ADDERALL) 10 MG tablet Take 20 mg by mouth daily.       Marland Kitchen dexamethasone (DECADRON) 4 MG tablet Take 2 tablets by mouth once a day on the day after chemotherapy and then take 2 tablets two times a day for 2 days. Take with food.  30 tablet  1  . escitalopram (LEXAPRO) 20 MG tablet Take 20 mg by mouth daily.        Marland Kitchen esomeprazole (NEXIUM) 40 MG capsule Take 40 mg by mouth daily as needed. For reflux      . lidocaine-prilocaine (EMLA) cream Apply topically as needed.  30 g  7  . LORazepam (ATIVAN) 0.5 MG tablet Take 1 tablet (0.5 mg total) by mouth every 6 (six) hours as needed (Nausea or vomiting).  30 tablet  0  . Multiple Vitamin (MULTIVITAMIN) tablet Take 1 tablet by mouth daily.      . Omega-3 Fatty Acids (  FISH OIL PO) Take 1 capsule by mouth daily.      . ondansetron (ZOFRAN) 8 MG tablet Take 1 tablet (8 mg total) by mouth 2 (two) times daily as needed. Take two times a day as needed for nausea or vomiting starting on the third day after chemotherapy.  30 tablet  1  . OVER THE COUNTER MEDICATION Take by mouth as needed.      Marland Kitchen OVER THE COUNTER MEDICATION Take by mouth as needed. OTC stool softner, as needed      .  valsartan-hydrochlorothiazide (DIOVAN-HCT) 320-25 MG per tablet Take 1 tablet by mouth daily.       . ciprofloxacin (CIPRO) 500 MG tablet Take 1 tablet (500 mg total) by mouth 2 (two) times daily.  14 tablet  6  . oxyCODONE-acetaminophen (PERCOCET/ROXICET) 5-325 MG per tablet Take 1-2 tablets by mouth every 4 (four) hours as needed.  30 tablet  0  . potassium chloride SA (K-DUR,KLOR-CON) 20 MEQ tablet Take 1 tablet (20 mEq total) by mouth daily. For 7 days.  7 tablet  0  . prochlorperazine (COMPAZINE) 10 MG tablet Take 1 tablet (10 mg total) by mouth every 6 (six) hours as needed (Nausea or vomiting).  30 tablet  1  . prochlorperazine (COMPAZINE) 25 MG suppository Place 1 suppository (25 mg total) rectally every 12 (twelve) hours as needed for nausea.  12 suppository  3   No current facility-administered medications for this visit.    SURGICAL HISTORY:  Past Surgical History  Procedure Laterality Date  . Anal sphincterotomy  04/2011  . Hemorrhoid surgery  04/2011    ligation  . Breast lumpectomy with needle localization Left 02/04/2013    Procedure: LEFT BREAST LUMPECTOMY WITH NEEDLE LOCALIZATION;  Surgeon: Almond Lint, MD;  Location: [facility] OR;  Service: General;  Laterality: Left;  . Axillary lymph node dissection Left 02/04/2013    Procedure: LEFT AXILLARY LYMPH NODE DISSECTION;  Surgeon: Almond Lint, MD;  Location: [facility] OR;  Service: General;  Laterality: Left;  End: 1512  . Portacath placement Right 02/04/2013    Procedure: INSERTION PORT-A-CATH;  Surgeon: Almond Lint, MD;  Location: [facility] OR;  Service: General;  Laterality: Right;  Start Time: 9604.  Marland Kitchen Appendectomy  1980    REVIEW OF SYSTEMS:  Pertinent items are noted in HPI.   HEALTH MAINTENANCE:  PHYSICAL EXAMINATION: Blood pressure 124/71, pulse 58, temperature 98 F (36.7 C), temperature source Oral, resp. rate 20, height 5\' 2"  (1.575 m), weight 162 lb 1.6 oz (73.528 kg). Body mass index is 29.64 kg/(m^2). ECOG PERFORMANCE STATUS: 0 -  Asymptomatic Well-developed well-nourished female in no acute distress HEENT exam EOMI PERRLA sclerae anicteric no conjunctival pallor oral mucosa is moist neck is supple Lungs clear to auscultation Cardiovascular regular rate rhythm Abdomen soft nontender nondistended bowel sounds are present no HSM Extremities no edema Neuro patient's alert oriented otherwise nonfocal Left lumpectomy site looks well-healed no redness no tenderness right breast no masses or nipple discharge.    LABORATORY DATA: Lab Results  Component Value Date   WBC 1.4 confirmed* 03/12/2013   HGB 11.4* 03/12/2013   HCT 33.7* 03/12/2013   MCV 83.4 03/12/2013   PLT 111 confirmed* 03/12/2013      Chemistry      Component Value Date/Time   NA 140 03/12/2013 0853   NA 137 02/05/2013 0505   K 3.5 03/12/2013 0853   K 3.4* 02/05/2013 0505   CL 100 03/12/2013 0853   CL 102 02/05/2013 0505  CO2 29 03/12/2013 0853   CO2 27 02/05/2013 0505   BUN 17.4 03/12/2013 0853   BUN 8 02/05/2013 0505   CREATININE 0.8 03/12/2013 0853   CREATININE 0.86 02/05/2013 0505      Component Value Date/Time   CALCIUM 9.5 03/12/2013 0853   CALCIUM 8.0* 02/05/2013 0505   ALKPHOS 73 03/12/2013 0853   AST 20 03/12/2013 0853   ALT 25 03/12/2013 0853   BILITOT 0.81 03/12/2013 0853       RADIOGRAPHIC STUDIES:  No results found.  ASSESSMENT: 57 year old female with  #1 stage II invasive ductal carcinoma of the left breast status post lumpectomy. The tumor was node +1 of 19 lymph nodes positive for metastatic disease triple negative. Postoperatively she is doing well.  #2 recommendation is to proceed with chemotherapy consisting of Adriamycin Cytoxan given dose dense with day 2 Neulasta. She will receive 4 cycles of this. This will then be followed by 12 weeks of Taxol and carboplatinum. Risks and benefits of treatment and rationale were discussed with the patient and her family.   PLAN:    #1 proceed with adjuvant chemotherapy cycle 1 day 1.  #2 she will return in one  week's time for followup   All questions were answered. The patient knows to call the clinic with any problems, questions or concerns. We can certainly see the patient much sooner if necessary.  I spent 25 minutes counseling the patient face to face. The total time spent in the appointment was 30 minutes.    Drue Second, MD Medical/Oncology [facility] [phone] (beeper) [phone] (Office)

## 2013-03-19 ENCOUNTER — Encounter: Payer: Self-pay | Admitting: Adult Health

## 2013-03-19 ENCOUNTER — Other Ambulatory Visit (HOSPITAL_BASED_OUTPATIENT_CLINIC_OR_DEPARTMENT_OTHER): Payer: BC Managed Care – PPO | Admitting: Lab

## 2013-03-19 ENCOUNTER — Ambulatory Visit (HOSPITAL_BASED_OUTPATIENT_CLINIC_OR_DEPARTMENT_OTHER): Payer: BC Managed Care – PPO | Admitting: Adult Health

## 2013-03-19 VITALS — BP 103/65 | HR 73 | Temp 98.0°F | Resp 20 | Ht 62.0 in | Wt 160.5 lb

## 2013-03-19 DIAGNOSIS — C50919 Malignant neoplasm of unspecified site of unspecified female breast: Secondary | ICD-10-CM

## 2013-03-19 DIAGNOSIS — C773 Secondary and unspecified malignant neoplasm of axilla and upper limb lymph nodes: Secondary | ICD-10-CM

## 2013-03-19 DIAGNOSIS — C50212 Malignant neoplasm of upper-inner quadrant of left female breast: Secondary | ICD-10-CM

## 2013-03-19 DIAGNOSIS — E876 Hypokalemia: Secondary | ICD-10-CM

## 2013-03-19 DIAGNOSIS — G4700 Insomnia, unspecified: Secondary | ICD-10-CM

## 2013-03-19 LAB — COMPREHENSIVE METABOLIC PANEL (CC13)
AST: 24 U/L (ref 5–34)
Albumin: 3.3 g/dL — ABNORMAL LOW (ref 3.5–5.0)
Alkaline Phosphatase: 69 U/L (ref 40–150)
BUN: 10.4 mg/dL (ref 7.0–26.0)
Creatinine: 0.9 mg/dL (ref 0.6–1.1)
Glucose: 110 mg/dl — ABNORMAL HIGH (ref 70–99)
Potassium: 3 mEq/L — CL (ref 3.5–5.1)

## 2013-03-19 LAB — CBC WITH DIFFERENTIAL/PLATELET
EOS%: 0.1 % (ref 0.0–7.0)
Eosinophils Absolute: 0 10*3/uL (ref 0.0–0.5)
MCH: 28.4 pg (ref 25.1–34.0)
MCV: 85.5 fL (ref 79.5–101.0)
MONO%: 9.7 % (ref 0.0–14.0)
NEUT#: 3.3 10*3/uL (ref 1.5–6.5)
RBC: 3.61 10*6/uL — ABNORMAL LOW (ref 3.70–5.45)
RDW: 13.3 % (ref 11.2–14.5)

## 2013-03-19 MED ORDER — POTASSIUM CHLORIDE CRYS ER 20 MEQ PO TBCR: 20.0000 meq | EXTENDED_RELEASE_TABLET | Freq: Every day | ORAL | Status: DC

## 2013-03-19 NOTE — Progress Notes (Signed)
OFFICE PROGRESS NOTE  CC**  [name],[name], MD [address], [address] [place] [address]  DIAGNOSIS: 57 year old female with stage IIA ER weakly positive, PR negative HER-2/neu negative, invasive ductal carcinoma of the left breast.   PRIOR THERAPY: 1. Earlier this year it on routine screening mammography the patient was noted to have a abnormality within the 12:00 position of the left breast. Patient returned for additional imaging with 3-D, tomosynthesis. Within the left breast there were 2 masses somewhat lobulated with irregular margins in the upper inner aspect of the left breast. These were separated by approximately a centimeter. On ultrasound the lesion the 11:00 position measured 1.0 x 0.7 cm. The 12:00 lesion measured 1.6 x 0.9 cm. In addition within the axillary region there was a 2.1 x 1.7 suspicious appearing lymph node.. The patient proceeded to undergo ultrasound-guided biopsy of the 2 areas within the left breast as well as the suspicious left axillary lymph node. Biopsies from both breast areas revealed invasive ductal carcinoma. In addition the lymph node from the left axilla revealed ductal carcinoma. The lesions appeared to be high-grade. There was no HER-2/neu amplification and hormone staining was minimal consistent with essentially consistent with triple negative breast cancer.patient's case was discussed at the multidisciplinary breast conference her pathology and radiology were reviewed  2. Patient underwent lumpectomy of the left breast with Dr. Donell Beers on 02/04/13, a 1.6cm tumor was removed along with an axillary lymph node dissection that found 1/19 nodes positive for metastatic disease.    3. Patient was then started on adjuvant chemotherapy on 03/05/13 with Adriamycin/Cytoxan.   CURRENT THERAPY: Adriamycin Cytoxan Cycle 2 day 1  INTERVAL HISTORY: Kristi [name] 57 y.o. female returns for follow up prior to her second cycle of adjuvant Adriamycin/Cytoxan.  She  has been doing well this week.  She has had a dry mouth that salt water rinses has been helping.  She is also experiencing insomnia, particularly on the nights she takes Dexamethasone and the Xanax prescribed isn't helping.  She denies fevers, chills, nausea, vomiting, constipation, diarrhea, numbness.  Otherwise, a 10 point ROS is negative.   MEDICAL HISTORY: Past Medical History  Diagnosis Date  . Anxiety   . GERD (gastroesophageal reflux disease)   . Hypertension   . Breast cancer   . MRSA (methicillin resistant Staphylococcus aureus) 2009    ALLERGIES:  has No Known Allergies.  MEDICATIONS:  Current Outpatient Prescriptions  Medication Sig Dispense Refill  . ALPRAZolam (XANAX) 1 MG tablet Take 1 mg by mouth at bedtime as needed for sleep.       Marland Kitchen amphetamine-dextroamphetamine (ADDERALL) 10 MG tablet Take 20 mg by mouth daily.       Marland Kitchen dexamethasone (DECADRON) 4 MG tablet Take 2 tablets by mouth once a day on the day after chemotherapy and then take 2 tablets two times a day for 2 days. Take with food.  30 tablet  1  . escitalopram (LEXAPRO) 20 MG tablet Take 20 mg by mouth daily.        Marland Kitchen esomeprazole (NEXIUM) 40 MG capsule Take 40 mg by mouth daily as needed. For reflux      . lidocaine-prilocaine (EMLA) cream Apply topically as needed.  30 g  7  . LORazepam (ATIVAN) 0.5 MG tablet Take 1 tablet (0.5 mg total) by mouth every 6 (six) hours as needed (Nausea or vomiting).  30 tablet  0  . Multiple Vitamin (MULTIVITAMIN) tablet Take 1 tablet by mouth daily.      Marland Kitchen  Omega-3 Fatty Acids (FISH OIL PO) Take 1 capsule by mouth daily.      . ondansetron (ZOFRAN) 8 MG tablet Take 1 tablet (8 mg total) by mouth 2 (two) times daily as needed. Take two times a day as needed for nausea or vomiting starting on the third day after chemotherapy.  30 tablet  1  . OVER THE COUNTER MEDICATION Take by mouth as needed.      Marland Kitchen OVER THE COUNTER MEDICATION Take by mouth as needed. OTC stool softner, as needed       . prochlorperazine (COMPAZINE) 10 MG tablet Take 1 tablet (10 mg total) by mouth every 6 (six) hours as needed (Nausea or vomiting).  30 tablet  1  . prochlorperazine (COMPAZINE) 25 MG suppository Place 1 suppository (25 mg total) rectally every 12 (twelve) hours as needed for nausea.  12 suppository  3  . valsartan-hydrochlorothiazide (DIOVAN-HCT) 320-25 MG per tablet Take 1 tablet by mouth daily.       . potassium chloride SA (K-DUR,KLOR-CON) 20 MEQ tablet Take 1 tablet (20 mEq total) by mouth daily.  10 tablet  0   No current facility-administered medications for this visit.   Facility-Administered Medications Ordered in Other Visits  Medication Dose Route Frequency Provider Last Rate Last Dose  . sodium chloride 0.9 % injection 10 mL  10 mL Intracatheter PRN Victorino December, MD   10 mL at 03/20/13 1442    SURGICAL HISTORY:  Past Surgical History  Procedure Laterality Date  . Anal sphincterotomy  04/2011  . Hemorrhoid surgery  04/2011    ligation  . Breast lumpectomy with needle localization Left 02/04/2013    Procedure: LEFT BREAST LUMPECTOMY WITH NEEDLE LOCALIZATION;  Surgeon: Almond Lint, MD;  Location: [facility] OR;  Service: General;  Laterality: Left;  . Axillary lymph node dissection Left 02/04/2013    Procedure: LEFT AXILLARY LYMPH NODE DISSECTION;  Surgeon: Almond Lint, MD;  Location: [facility] OR;  Service: General;  Laterality: Left;  End: 1512  . Portacath placement Right 02/04/2013    Procedure: INSERTION PORT-A-CATH;  Surgeon: Almond Lint, MD;  Location: [facility] OR;  Service: General;  Laterality: Right;  Start Time: 1610.  Marland Kitchen Appendectomy  1980    REVIEW OF SYSTEMS:   General: fatigue (-), night sweats (-), fever (-), pain (-) Lymph: palpable nodes (-) HEENT: vision changes (-), mucositis (-), gum bleeding (-), epistaxis (-) Cardiovascular: chest pain (-), palpitations (-) Pulmonary: shortness of breath (-), dyspnea on exertion (-), cough (-), hemoptysis (-) GI:  Early satiety (-),  melena (-), dysphagia (-), nausea/vomiting (-), diarrhea (-) GU: dysuria (-), hematuria (-), incontinence (-) Musculoskeletal: joint swelling (-), joint pain (-), back pain (-) Neuro: weakness (-), numbness (-), headache (-), confusion (-) Skin: Rash (-), lesions (-), dryness (-) Psych: depression (-), suicidal/homicidal ideation (-), feeling of hopelessness (-)   PHYSICAL EXAMINATION: Blood pressure 103/65, pulse 73, temperature 98 F (36.7 C), temperature source Oral, resp. rate 20, height 5\' 2"  (1.575 m), weight 160 lb 8 oz (72.802 kg). Body mass index is 29.35 kg/(m^2). General: Patient is a well appearing female in no acute distress HEENT: PERRLA, sclerae anicteric no conjunctival pallor, MMM Neck: supple, no palpable adenopathy Lungs: clear to auscultation bilaterally, no wheezes, rhonchi, or rales Cardiovascular: regular rate rhythm, S1, S2, no murmurs, rubs or gallops Abdomen: Soft, non-tender, non-distended, normoactive bowel sounds, no HSM Extremities: warm and well perfused, no clubbing, cyanosis, or edema Skin: No rashes or lesions Neuro: Non-focal Breasts: left  breast lumpectomy site no nodularity, healing well, scar tissue underneath axillary incision, no erythema, swelling, drainage, right breast no masses or nodularity.  ECOG PERFORMANCE STATUS: 1 - Symptomatic but completely ambulatory  LABORATORY DATA: Lab Results  Component Value Date   WBC 5.7 03/19/2013   HGB 10.3* 03/19/2013   HCT 30.9* 03/19/2013   MCV 85.5 03/19/2013   PLT 137* 03/19/2013      Chemistry      Component Value Date/Time   NA 140 03/19/2013 1500   NA 137 02/05/2013 0505   K 3.0 Repeated and Verified* 03/19/2013 1500   K 3.4* 02/05/2013 0505   CL 100 03/19/2013 1500   CL 102 02/05/2013 0505   CO2 30* 03/19/2013 1500   CO2 27 02/05/2013 0505   BUN 10.4 03/19/2013 1500   BUN 8 02/05/2013 0505   CREATININE 0.9 03/19/2013 1500   CREATININE 0.86 02/05/2013 0505      Component Value Date/Time   CALCIUM 9.6  03/19/2013 1500   CALCIUM 8.0* 02/05/2013 0505   ALKPHOS 69 03/19/2013 1500   AST 24 03/19/2013 1500   ALT 29 03/19/2013 1500   BILITOT <0.20 Repeated and Verified 03/19/2013 1500       RADIOGRAPHIC STUDIES:  No results found.  ASSESSMENT: 57 year old female with new diagnosis of   #1 multifocal invasive ductal carcinoma grade 3 ER weakly positive at 3% PR negative HER-2/neu negative with Ki-67 of 90% tumor is very aggressive. She does have a palpable lymph node as well in the left axilla. Patient was seen in the multidisciplinary clinic for discussion of treatment options.   #2 patient underwent a lumpectomy with axillary lymph node dissection with Dr. Luz Brazen on 02/04/13. A 1.6 cm tumor was removed with 1/19 lymph nodes positive for disease.    #3 She was recommended adjuvant chemotherapy consisting of Adriamycin Cytoxan given dose dense for total of 4 cycles followed by Taxol carboplatinum weekly for 12 weeks. Rationale for this aggressive treatment was discussed with the patient since she is essentially has a triple-negative disease.   #4 certainly patient will need post lumpectomy radiation therapy she was seen by radiation oncology. This will begin after patient has completed her chemotherapy.   #5 we discussed role of genetics, and she had an appointment with Maylon Cos on 03/06/13.     PLAN:  1. Doing well.  Ms. Busick will proceed with cycle two of chemotherapy.  She will continue salt water rinses, I also recommended she try Ativan for her insomnia.   2. We will see Ms. Haft back next week for labs and an appointment to evaluate for chemotoxicities.    All questions were answered. The patient knows to call the clinic with any problems, questions or concerns. We can certainly see the patient much sooner if necessary.  I spent 25 minutes counseling the patient face to face. The total time spent in the appointment was 30 minutes.  Cherie Ouch Lyn Hollingshead, NP Medical Oncology [facility] Phone: [phone] 03/20/2013, 5:27 PM

## 2013-03-19 NOTE — Patient Instructions (Addendum)
Doing well.  Proceed with chemotherapy.  Please call us if you have any questions or concerns.    

## 2013-03-20 ENCOUNTER — Other Ambulatory Visit: Payer: BC Managed Care – PPO | Admitting: Lab

## 2013-03-20 ENCOUNTER — Ambulatory Visit (HOSPITAL_BASED_OUTPATIENT_CLINIC_OR_DEPARTMENT_OTHER): Payer: BC Managed Care – PPO

## 2013-03-20 VITALS — BP 116/64 | HR 83 | Temp 97.8°F | Resp 20

## 2013-03-20 DIAGNOSIS — C50212 Malignant neoplasm of upper-inner quadrant of left female breast: Secondary | ICD-10-CM

## 2013-03-20 DIAGNOSIS — Z5111 Encounter for antineoplastic chemotherapy: Secondary | ICD-10-CM

## 2013-03-20 DIAGNOSIS — C50219 Malignant neoplasm of upper-inner quadrant of unspecified female breast: Secondary | ICD-10-CM

## 2013-03-20 MED ORDER — DEXAMETHASONE SODIUM PHOSPHATE 20 MG/5ML IJ SOLN
12.0000 mg | Freq: Once | INTRAMUSCULAR | Status: AC
  Administered 2013-03-20: 12 mg via INTRAVENOUS

## 2013-03-20 MED ORDER — SODIUM CHLORIDE 0.9 % IJ SOLN
10.0000 mL | INTRAMUSCULAR | Status: DC | PRN
  Administered 2013-03-20: 10 mL
  Filled 2013-03-20: qty 10

## 2013-03-20 MED ORDER — LORAZEPAM 2 MG/ML IJ SOLN
0.5000 mg | Freq: Once | INTRAMUSCULAR | Status: AC
  Administered 2013-03-20: 0.5 mg via INTRAVENOUS

## 2013-03-20 MED ORDER — DOXORUBICIN HCL CHEMO IV INJECTION 2 MG/ML
60.0000 mg/m2 | Freq: Once | INTRAVENOUS | Status: AC
  Administered 2013-03-20: 106 mg via INTRAVENOUS
  Filled 2013-03-20: qty 53

## 2013-03-20 MED ORDER — SODIUM CHLORIDE 0.9 % IV SOLN
Freq: Once | INTRAVENOUS | Status: AC
  Administered 2013-03-20: 12:00:00 via INTRAVENOUS

## 2013-03-20 MED ORDER — SODIUM CHLORIDE 0.9 % IV SOLN
600.0000 mg/m2 | Freq: Once | INTRAVENOUS | Status: AC
  Administered 2013-03-20: 1060 mg via INTRAVENOUS
  Filled 2013-03-20: qty 53

## 2013-03-20 MED ORDER — HEPARIN SOD (PORK) LOCK FLUSH 100 UNIT/ML IV SOLN
500.0000 [IU] | Freq: Once | INTRAVENOUS | Status: AC | PRN
  Administered 2013-03-20: 500 [IU]
  Filled 2013-03-20: qty 5

## 2013-03-20 MED ORDER — PALONOSETRON HCL INJECTION 0.25 MG/5ML
0.2500 mg | Freq: Once | INTRAVENOUS | Status: AC
  Administered 2013-03-20: 0.25 mg via INTRAVENOUS

## 2013-03-20 MED ORDER — SODIUM CHLORIDE 0.9 % IV SOLN
150.0000 mg | Freq: Once | INTRAVENOUS | Status: AC
  Administered 2013-03-20: 150 mg via INTRAVENOUS
  Filled 2013-03-20: qty 5

## 2013-03-20 NOTE — Patient Instructions (Addendum)
[facility] Discharge Instructions for Patients Receiving Chemotherapy  Today you received the following chemotherapy agents Adriamycin and Cytoxan.  To help prevent nausea and vomiting after your treatment, we encourage you to take your nausea medication as prescribed.    If you develop nausea and vomiting that is not controlled by your nausea medication, call the clinic. If it is after clinic hours your family physician or the after hours number for the clinic or go to the Emergency Department.   BELOW ARE SYMPTOMS THAT SHOULD BE REPORTED IMMEDIATELY:  *FEVER GREATER THAN 100.5 F  *CHILLS WITH OR WITHOUT FEVER  NAUSEA AND VOMITING THAT IS NOT CONTROLLED WITH YOUR NAUSEA MEDICATION  *UNUSUAL SHORTNESS OF BREATH  *UNUSUAL BRUISING OR BLEEDING  TENDERNESS IN MOUTH AND THROAT WITH OR WITHOUT PRESENCE OF ULCERS  *URINARY PROBLEMS  *BOWEL PROBLEMS  UNUSUAL RASH Items with * indicate a potential emergency and should be followed up as soon as possible.  Please let the nurse know about any problems that you may have experienced. Feel free to call the clinic you have any questions or concerns. The clinic phone number is [phone].   I have been informed and understand all the instructions given to me. I know to contact the clinic, my physician, or go to the Emergency Department if any problems should occur. I do not have any questions at this time, but understand that I may call the clinic during office hours   should I have any questions or need assistance in obtaining follow up care.    __________________________________________  _____________  __________ Signature of Patient or Authorized Representative            Date                   Time    __________________________________________ Nurse's Signature

## 2013-03-21 ENCOUNTER — Ambulatory Visit (HOSPITAL_BASED_OUTPATIENT_CLINIC_OR_DEPARTMENT_OTHER): Payer: BC Managed Care – PPO

## 2013-03-21 VITALS — BP 107/56 | HR 80 | Temp 98.2°F

## 2013-03-21 DIAGNOSIS — C50219 Malignant neoplasm of upper-inner quadrant of unspecified female breast: Secondary | ICD-10-CM

## 2013-03-21 DIAGNOSIS — Z5189 Encounter for other specified aftercare: Secondary | ICD-10-CM

## 2013-03-21 DIAGNOSIS — C50212 Malignant neoplasm of upper-inner quadrant of left female breast: Secondary | ICD-10-CM

## 2013-03-21 MED ORDER — PEGFILGRASTIM INJECTION 6 MG/0.6ML
6.0000 mg | Freq: Once | SUBCUTANEOUS | Status: AC
  Administered 2013-03-21: 6 mg via SUBCUTANEOUS
  Filled 2013-03-21: qty 0.6

## 2013-03-26 ENCOUNTER — Ambulatory Visit (HOSPITAL_BASED_OUTPATIENT_CLINIC_OR_DEPARTMENT_OTHER): Payer: BC Managed Care – PPO | Admitting: Adult Health

## 2013-03-26 ENCOUNTER — Telehealth: Payer: Self-pay | Admitting: *Deleted

## 2013-03-26 ENCOUNTER — Other Ambulatory Visit (HOSPITAL_BASED_OUTPATIENT_CLINIC_OR_DEPARTMENT_OTHER): Payer: BC Managed Care – PPO | Admitting: Lab

## 2013-03-26 ENCOUNTER — Encounter: Payer: Self-pay | Admitting: Adult Health

## 2013-03-26 VITALS — BP 100/63 | HR 82 | Temp 98.2°F | Resp 20 | Ht 62.0 in | Wt 158.4 lb

## 2013-03-26 DIAGNOSIS — C50919 Malignant neoplasm of unspecified site of unspecified female breast: Secondary | ICD-10-CM

## 2013-03-26 DIAGNOSIS — D702 Other drug-induced agranulocytosis: Secondary | ICD-10-CM

## 2013-03-26 DIAGNOSIS — Z171 Estrogen receptor negative status [ER-]: Secondary | ICD-10-CM

## 2013-03-26 DIAGNOSIS — C50212 Malignant neoplasm of upper-inner quadrant of left female breast: Secondary | ICD-10-CM

## 2013-03-26 DIAGNOSIS — C773 Secondary and unspecified malignant neoplasm of axilla and upper limb lymph nodes: Secondary | ICD-10-CM

## 2013-03-26 LAB — COMPREHENSIVE METABOLIC PANEL (CC13)
ALT: 22 U/L (ref 0–55)
AST: 17 U/L (ref 5–34)
Albumin: 3.3 g/dL — ABNORMAL LOW (ref 3.5–5.0)
Alkaline Phosphatase: 76 U/L (ref 40–150)
BUN: 14.4 mg/dL (ref 7.0–26.0)
CO2: 28 meq/L (ref 22–29)
Calcium: 9.1 mg/dL (ref 8.4–10.4)
Chloride: 103 meq/L (ref 98–107)
Creatinine: 0.8 mg/dL (ref 0.6–1.1)
Glucose: 120 mg/dL — ABNORMAL HIGH (ref 70–99)
Potassium: 3.6 meq/L (ref 3.5–5.1)
Sodium: 140 meq/L (ref 136–145)
Total Bilirubin: 0.36 mg/dL (ref 0.20–1.20)
Total Protein: 6.3 g/dL — ABNORMAL LOW (ref 6.4–8.3)

## 2013-03-26 LAB — CBC WITH DIFFERENTIAL/PLATELET
BASO%: 0 % (ref 0.0–2.0)
Basophils Absolute: 0 10e3/uL (ref 0.0–0.1)
EOS%: 0 % (ref 0.0–7.0)
Eosinophils Absolute: 0 10e3/uL (ref 0.0–0.5)
HCT: 29.3 % — ABNORMAL LOW (ref 34.8–46.6)
HGB: 10 g/dL — ABNORMAL LOW (ref 11.6–15.9)
LYMPH%: 76.3 % — ABNORMAL HIGH (ref 14.0–49.7)
MCH: 28.3 pg (ref 25.1–34.0)
MCHC: 34.1 g/dL (ref 31.5–36.0)
MCV: 83 fL (ref 79.5–101.0)
MONO#: 0 10e3/uL — ABNORMAL LOW (ref 0.1–0.9)
MONO%: 2.2 % (ref 0.0–14.0)
NEUT#: 0.2 10e3/uL — CL (ref 1.5–6.5)
NEUT%: 21.5 % — ABNORMAL LOW (ref 38.4–76.8)
Platelets: 175 10e3/uL (ref 145–400)
RBC: 3.53 10e6/uL — ABNORMAL LOW (ref 3.70–5.45)
RDW: 13.7 % (ref 11.2–14.5)
WBC: 0.9 10e3/uL — CL (ref 3.9–10.3)
lymph#: 0.7 10e3/uL — ABNORMAL LOW (ref 0.9–3.3)

## 2013-03-26 MED ORDER — CIPROFLOXACIN HCL 500 MG PO TABS: 500.0000 mg | ORAL_TABLET | Freq: Two times a day (BID) | ORAL | Status: DC

## 2013-03-26 NOTE — Patient Instructions (Addendum)
  Patient Neutropenia Instruction Sheet  Diagnosis: Breast Cancer      Treating Physician: Drue Second, MD  Treatment: 1. Type of chemotherapy: Adriamycin/Cytoxan 2. Date of last treatment: 03/19/13  Last Blood Counts: Lab Results  Component Value Date   WBC 0.9* 03/26/2013   HGB 10.0* 03/26/2013   HCT 29.3* 03/26/2013   MCV 83.0 03/26/2013   PLT 175 03/26/2013  ANC 200      Prophylactic Antibiotics: Cipro 500 mg by mouth twice a day Instructions: 1. Monitor temperature and call if fever  greater than 100.5, chills, shaking chills (rigors) 2. Call Physician on-call at [phone] 3. Give him/her symptoms and list of medications that you are taking and your last blood count.

## 2013-03-26 NOTE — Progress Notes (Signed)
OFFICE PROGRESS NOTE  CC**  [name],[name], MD [address] [place] [address]  DIAGNOSIS: 57 year old female with stage IIA ER weakly positive, PR negative HER-2/neu negative, invasive ductal carcinoma of the left breast.   PRIOR THERAPY: 1. Earlier this year it on routine screening mammography the patient was noted to have a abnormality within the 12:00 position of the left breast. Patient returned for additional imaging with 3-D, tomosynthesis. Within the left breast there were 2 masses somewhat lobulated with irregular margins in the upper inner aspect of the left breast. These were separated by approximately a centimeter. On ultrasound the lesion the 11:00 position measured 1.0 x 0.7 cm. The 12:00 lesion measured 1.6 x 0.9 cm. In addition within the axillary region there was a 2.1 x 1.7 suspicious appearing lymph node.. The patient proceeded to undergo ultrasound-guided biopsy of the 2 areas within the left breast as well as the suspicious left axillary lymph node. Biopsies from both breast areas revealed invasive ductal carcinoma. In addition the lymph node from the left axilla revealed ductal carcinoma. The lesions appeared to be high-grade. There was no HER-2/neu amplification and hormone staining was minimal consistent with essentially consistent with triple negative breast cancer.patient's case was discussed at the multidisciplinary breast conference her pathology and radiology were reviewed  2. Patient underwent lumpectomy of the left breast with Dr. Donell Beers on 02/04/13, a 1.6cm tumor was removed along with an axillary lymph node dissection that found 1/19 nodes positive for metastatic disease.    3. Patient was then started on adjuvant chemotherapy on 03/05/13 with Adriamycin/Cytoxan.   CURRENT THERAPY: Adriamycin Cytoxan Cycle 2 day 8  INTERVAL HISTORY: Kristi [name] 57 y.o. female returns for follow up after her second cycle of adjuvant Adriamycin/Cytoxan.  She is  doing well.  She previously had a washed out feeling that is now improving.  She is modifying her diet due to her taste changes, and is drinking water, approximately 60 ounces per day in addition to [place].  She had mild constipation, that was relieved perhaps a little too much with Senokot.  She hasn't had a bowel movment today and is planning on taking 1 tab of senokot tonight.  She denies fevers, chills, nausea, vomiting, skin changes, numbness, or any other concerns.    MEDICAL HISTORY: Past Medical History  Diagnosis Date  . Anxiety   . GERD (gastroesophageal reflux disease)   . Hypertension   . Breast cancer   . MRSA (methicillin resistant Staphylococcus aureus) 2009    ALLERGIES:  has No Known Allergies.  MEDICATIONS:  Current Outpatient Prescriptions  Medication Sig Dispense Refill  . ALPRAZolam (XANAX) 1 MG tablet Take 1 mg by mouth at bedtime as needed for sleep.       Marland Kitchen amphetamine-dextroamphetamine (ADDERALL) 10 MG tablet Take 20 mg by mouth daily.       Marland Kitchen dexamethasone (DECADRON) 4 MG tablet Take 2 tablets by mouth once a day on the day after chemotherapy and then take 2 tablets two times a day for 2 days. Take with food.  30 tablet  1  . escitalopram (LEXAPRO) 20 MG tablet Take 40 mg by mouth daily.       Marland Kitchen esomeprazole (NEXIUM) 40 MG capsule Take 40 mg by mouth daily as needed. For reflux      . lidocaine-prilocaine (EMLA) cream Apply topically as needed.  30 g  7  . LORazepam (ATIVAN) 0.5 MG tablet Take 1 tablet (0.5 mg total)  by mouth every 6 (six) hours as needed (Nausea or vomiting).  30 tablet  0  . Multiple Vitamin (MULTIVITAMIN) tablet Take 1 tablet by mouth daily.      . Omega-3 Fatty Acids (FISH OIL PO) Take 1 capsule by mouth daily.      . ondansetron (ZOFRAN) 8 MG tablet Take 1 tablet (8 mg total) by mouth 2 (two) times daily as needed. Take two times a day as needed for nausea or vomiting starting on the third day after chemotherapy.  30 tablet  1  . OVER THE  COUNTER MEDICATION Take by mouth as needed.      Marland Kitchen OVER THE COUNTER MEDICATION Take by mouth as needed. OTC stool softner, as needed      . potassium chloride SA (K-DUR,KLOR-CON) 20 MEQ tablet Take 1 tablet (20 mEq total) by mouth daily.  10 tablet  0  . prochlorperazine (COMPAZINE) 10 MG tablet Take 1 tablet (10 mg total) by mouth every 6 (six) hours as needed (Nausea or vomiting).  30 tablet  1  . prochlorperazine (COMPAZINE) 25 MG suppository Place 1 suppository (25 mg total) rectally every 12 (twelve) hours as needed for nausea.  12 suppository  3  . valsartan-hydrochlorothiazide (DIOVAN-HCT) 320-25 MG per tablet Take 1 tablet by mouth daily.        No current facility-administered medications for this visit.    SURGICAL HISTORY:  Past Surgical History  Procedure Laterality Date  . Anal sphincterotomy  04/2011  . Hemorrhoid surgery  04/2011    ligation  . Breast lumpectomy with needle localization Left 02/04/2013    Procedure: LEFT BREAST LUMPECTOMY WITH NEEDLE LOCALIZATION;  Surgeon: Almond Lint, MD;  Location: [facility] OR;  Service: General;  Laterality: Left;  . Axillary lymph node dissection Left 02/04/2013    Procedure: LEFT AXILLARY LYMPH NODE DISSECTION;  Surgeon: Almond Lint, MD;  Location: [facility] OR;  Service: General;  Laterality: Left;  End: 1512  . Portacath placement Right 02/04/2013    Procedure: INSERTION PORT-A-CATH;  Surgeon: Almond Lint, MD;  Location: [facility] OR;  Service: General;  Laterality: Right;  Start Time: 1610.  Marland Kitchen Appendectomy  1980    REVIEW OF SYSTEMS:   General: fatigue (-), night sweats (-), fever (-), pain (-) Lymph: palpable nodes (-) HEENT: vision changes (-), mucositis (-), gum bleeding (-), epistaxis (-) Cardiovascular: chest pain (-), palpitations (-) Pulmonary: shortness of breath (-), dyspnea on exertion (-), cough (-), hemoptysis (-) GI:  Early satiety (-), melena (-), dysphagia (-), nausea/vomiting (-), diarrhea (-) GU: dysuria (-), hematuria (-),  incontinence (-) Musculoskeletal: joint swelling (-), joint pain (-), back pain (-) Neuro: weakness (-), numbness (-), headache (-), confusion (-) Skin: Rash (-), lesions (-), dryness (-) Psych: depression (-), suicidal/homicidal ideation (-), feeling of hopelessness (-)   PHYSICAL EXAMINATION: Blood pressure 100/63, pulse 82, temperature 98.2 F (36.8 C), temperature source Oral, resp. rate 20, height 5\' 2"  (1.575 m), weight 158 lb 6.4 oz (71.85 kg). Body mass index is 28.96 kg/(m^2). General: Patient is a well appearing female in no acute distress HEENT: PERRLA, sclerae anicteric no conjunctival pallor, MMM Neck: supple, no palpable adenopathy Lungs: clear to auscultation bilaterally, no wheezes, rhonchi, or rales Cardiovascular: regular rate rhythm, S1, S2, no murmurs, rubs or gallops Abdomen: Soft, non-tender, non-distended, normoactive bowel sounds, no HSM Extremities: warm and well perfused, no clubbing, cyanosis, or edema Skin: No rashes or lesions Neuro: Non-focal Breasts: left breast lumpectomy site no nodularity, healing well, scar tissue  underneath axillary incision, no erythema, swelling, drainage, right breast no masses or nodularity.  ECOG PERFORMANCE STATUS: 1 - Symptomatic but completely ambulatory  LABORATORY DATA: Lab Results  Component Value Date   WBC 0.9* 03/26/2013   HGB 10.0* 03/26/2013   HCT 29.3* 03/26/2013   MCV 83.0 03/26/2013   PLT 175 03/26/2013      Chemistry      Component Value Date/Time   NA 140 03/19/2013 1500   NA 137 02/05/2013 0505   K 3.0 Repeated and Verified* 03/19/2013 1500   K 3.4* 02/05/2013 0505   CL 100 03/19/2013 1500   CL 102 02/05/2013 0505   CO2 30* 03/19/2013 1500   CO2 27 02/05/2013 0505   BUN 10.4 03/19/2013 1500   BUN 8 02/05/2013 0505   CREATININE 0.9 03/19/2013 1500   CREATININE 0.86 02/05/2013 0505      Component Value Date/Time   CALCIUM 9.6 03/19/2013 1500   CALCIUM 8.0* 02/05/2013 0505   ALKPHOS 69 03/19/2013 1500   AST 24 03/19/2013  1500   ALT 29 03/19/2013 1500   BILITOT <0.20 Repeated and Verified 03/19/2013 1500       RADIOGRAPHIC STUDIES:  No results found.  ASSESSMENT: 57 year old female with new diagnosis of   #1 multifocal invasive ductal carcinoma grade 3 ER weakly positive at 3% PR negative HER-2/neu negative with Ki-67 of 90% tumor is very aggressive. She does have a palpable lymph node as well in the left axilla. Patient was seen in the multidisciplinary clinic for discussion of treatment options.   #2 patient underwent a lumpectomy with axillary lymph node dissection with Dr. Luz Brazen on 02/04/13. A 1.6 cm tumor was removed with 1/19 lymph nodes positive for disease.    #3 She was recommended adjuvant chemotherapy consisting of Adriamycin Cytoxan given dose dense for total of 4 cycles followed by Taxol carboplatinum weekly for 12 weeks. Rationale for this aggressive treatment was discussed with the patient since she is essentially has a triple-negative disease.   #4 certainly patient will need post lumpectomy radiation therapy she was seen by radiation oncology. This will begin after patient has completed her chemotherapy.   #5 we discussed role of genetics, and she had an appointment with Maylon Cos on 03/06/13.     PLAN:  1. Doing well.  Ms. Lieser is subsequently neutropenic after chemotherapy.  I reviewed neutropenic precautions with her in detail and gave her written instructions in her AVS.  She will pick up her Cipro today.    2. We will see Ms. Searcy back next week for labs and an appointment for cycle 3 adriamycin cytoxan.    All questions were answered. The patient knows to call the clinic with any problems, questions or concerns. We can certainly see the patient much sooner if necessary.  I spent 25 minutes counseling the patient face to face. The total time spent in the appointment was 30 minutes.  Cherie Ouch Lyn Hollingshead, NP Medical Oncology [facility] Phone: [phone] 03/26/2013, 2:22 PM

## 2013-03-26 NOTE — Telephone Encounter (Signed)
appts made and printed. Pt is aware that her tx will follow after seeing LA on 6/12. i emailed MW to add tx for 6/12...td

## 2013-03-27 ENCOUNTER — Telehealth: Payer: Self-pay | Admitting: *Deleted

## 2013-03-27 NOTE — Telephone Encounter (Signed)
Per staff message and POF I have scheduled appts.  Kristi [name]  

## 2013-04-03 ENCOUNTER — Encounter: Payer: Self-pay | Admitting: Adult Health

## 2013-04-03 ENCOUNTER — Ambulatory Visit (HOSPITAL_BASED_OUTPATIENT_CLINIC_OR_DEPARTMENT_OTHER): Payer: BC Managed Care – PPO | Admitting: Adult Health

## 2013-04-03 ENCOUNTER — Other Ambulatory Visit: Payer: BC Managed Care – PPO | Admitting: Lab

## 2013-04-03 ENCOUNTER — Ambulatory Visit: Payer: BC Managed Care – PPO | Admitting: Oncology

## 2013-04-03 ENCOUNTER — Other Ambulatory Visit (HOSPITAL_BASED_OUTPATIENT_CLINIC_OR_DEPARTMENT_OTHER): Payer: BC Managed Care – PPO | Admitting: Lab

## 2013-04-03 ENCOUNTER — Ambulatory Visit (HOSPITAL_BASED_OUTPATIENT_CLINIC_OR_DEPARTMENT_OTHER): Payer: BC Managed Care – PPO

## 2013-04-03 VITALS — BP 118/70 | HR 83 | Temp 98.3°F | Resp 20 | Ht 62.0 in | Wt 160.7 lb

## 2013-04-03 DIAGNOSIS — C773 Secondary and unspecified malignant neoplasm of axilla and upper limb lymph nodes: Secondary | ICD-10-CM

## 2013-04-03 DIAGNOSIS — C50219 Malignant neoplasm of upper-inner quadrant of unspecified female breast: Secondary | ICD-10-CM

## 2013-04-03 DIAGNOSIS — C50212 Malignant neoplasm of upper-inner quadrant of left female breast: Secondary | ICD-10-CM

## 2013-04-03 DIAGNOSIS — Z170 Estrogen receptor positive status [ER+]: Secondary | ICD-10-CM

## 2013-04-03 DIAGNOSIS — E876 Hypokalemia: Secondary | ICD-10-CM

## 2013-04-03 DIAGNOSIS — Z5111 Encounter for antineoplastic chemotherapy: Secondary | ICD-10-CM

## 2013-04-03 LAB — COMPREHENSIVE METABOLIC PANEL (CC13)
Alkaline Phosphatase: 72 U/L (ref 40–150)
CO2: 30 mEq/L — ABNORMAL HIGH (ref 22–29)
Creatinine: 0.7 mg/dL (ref 0.6–1.1)
Glucose: 88 mg/dl (ref 70–99)
Sodium: 141 mEq/L (ref 136–145)
Total Bilirubin: <0.2 mg/dL (ref 0.20–1.20)
Total Protein: 6.4 g/dL (ref 6.4–8.3)

## 2013-04-03 LAB — CBC WITH DIFFERENTIAL/PLATELET
EOS%: 0 % (ref 0.0–7.0)
MCH: 27.7 pg (ref 25.1–34.0)
MCV: 83.2 fL (ref 79.5–101.0)
MONO%: 10.3 % (ref 0.0–14.0)
NEUT#: 4.9 10*3/uL (ref 1.5–6.5)
RBC: 3.39 10*6/uL — ABNORMAL LOW (ref 3.70–5.45)
RDW: 14.2 % (ref 11.2–14.5)
nRBC: 0 % (ref 0–0)

## 2013-04-03 MED ORDER — PALONOSETRON HCL INJECTION 0.25 MG/5ML
0.2500 mg | Freq: Once | INTRAVENOUS | Status: AC
  Administered 2013-04-03: 0.25 mg via INTRAVENOUS

## 2013-04-03 MED ORDER — SODIUM CHLORIDE 0.9 % IV SOLN
Freq: Once | INTRAVENOUS | Status: AC
  Administered 2013-04-03: 15:00:00 via INTRAVENOUS

## 2013-04-03 MED ORDER — DOXORUBICIN HCL CHEMO IV INJECTION 2 MG/ML
60.0000 mg/m2 | Freq: Once | INTRAVENOUS | Status: AC
  Administered 2013-04-03: 106 mg via INTRAVENOUS
  Filled 2013-04-03: qty 53

## 2013-04-03 MED ORDER — SODIUM CHLORIDE 0.9 % IJ SOLN
10.0000 mL | INTRAMUSCULAR | Status: DC | PRN
  Administered 2013-04-03: 10 mL
  Filled 2013-04-03: qty 10

## 2013-04-03 MED ORDER — LORAZEPAM 2 MG/ML IJ SOLN
0.5000 mg | Freq: Once | INTRAMUSCULAR | Status: AC
  Administered 2013-04-03: 0.5 mg via INTRAVENOUS

## 2013-04-03 MED ORDER — POTASSIUM CHLORIDE CRYS ER 20 MEQ PO TBCR: 20.0000 meq | EXTENDED_RELEASE_TABLET | Freq: Every day | ORAL | Status: DC

## 2013-04-03 MED ORDER — POTASSIUM CHLORIDE CRYS ER 20 MEQ PO TBCR
40.0000 meq | EXTENDED_RELEASE_TABLET | Freq: Once | ORAL | Status: AC
  Administered 2013-04-03: 40 meq via ORAL
  Filled 2013-04-03: qty 2

## 2013-04-03 MED ORDER — SODIUM CHLORIDE 0.9 % IV SOLN
150.0000 mg | Freq: Once | INTRAVENOUS | Status: AC
  Administered 2013-04-03: 150 mg via INTRAVENOUS
  Filled 2013-04-03: qty 5

## 2013-04-03 MED ORDER — HEPARIN SOD (PORK) LOCK FLUSH 100 UNIT/ML IV SOLN
500.0000 [IU] | Freq: Once | INTRAVENOUS | Status: AC | PRN
  Administered 2013-04-03: 500 [IU]
  Filled 2013-04-03: qty 5

## 2013-04-03 MED ORDER — SODIUM CHLORIDE 0.9 % IV SOLN
600.0000 mg/m2 | Freq: Once | INTRAVENOUS | Status: AC
  Administered 2013-04-03: 1060 mg via INTRAVENOUS
  Filled 2013-04-03: qty 53

## 2013-04-03 MED ORDER — DEXAMETHASONE SODIUM PHOSPHATE 20 MG/5ML IJ SOLN
12.0000 mg | Freq: Once | INTRAMUSCULAR | Status: AC
  Administered 2013-04-03: 16:00:00 via INTRAVENOUS

## 2013-04-03 NOTE — Addendum Note (Signed)
Addended by: Augustin Schooling C on: 04/03/2013 03:45 PM   Modules accepted: Orders

## 2013-04-03 NOTE — Progress Notes (Signed)
OFFICE PROGRESS NOTE  CC**  [name],[name], MD [address] [place] [address]  DIAGNOSIS: 57 year old female with stage IIA ER weakly positive, PR negative HER-2/neu negative, invasive ductal carcinoma of the left breast.   PRIOR THERAPY: 1. Earlier this year it on routine screening mammography the patient was noted to have a abnormality within the 12:00 position of the left breast. Patient returned for additional imaging with 3-[name], tomosynthesis. Within the left breast there were 2 masses somewhat lobulated with irregular margins in the upper inner aspect of the left breast. These were separated by approximately a centimeter. On ultrasound the lesion the 11:00 position measured 1.0 x 0.7 cm. The 12:00 lesion measured 1.6 x 0.9 cm. In addition within the axillary region there was a 2.1 x 1.7 suspicious appearing lymph node.. The patient proceeded to undergo ultrasound-guided biopsy of the 2 areas within the left breast as well as the suspicious left axillary lymph node. Biopsies from both breast areas revealed invasive ductal carcinoma. In addition the lymph node from the left axilla revealed ductal carcinoma. The lesions appeared to be high-grade. There was no HER-2/neu amplification and hormone staining was minimal consistent with essentially consistent with triple negative breast cancer.patient's case was discussed at the multidisciplinary breast conference her pathology and radiology were reviewed  2. Patient underwent lumpectomy of the left breast with Dr. Donell Beers on 02/04/13, a 1.6cm tumor was removed along with an axillary lymph node dissection that found 1/19 nodes positive for metastatic disease.    3. Patient was then started on adjuvant chemotherapy on 03/05/13 with Adriamycin/Cytoxan.   CURRENT THERAPY: Adriamycin Cytoxan Cycle 2 day 8  INTERVAL HISTORY: Kristi [name] 57 y.o. female returns for follow up prior to her third cycle of adjuvant Adriamycin/Cytoxan.  She  is doing well.  She continues to drink plenty of fluids.  She was having some foot pain that is now improved with insoles.  Otherwise, a 10 point ROS is neg.   MEDICAL HISTORY: Past Medical History  Diagnosis Date  . Anxiety   . GERD (gastroesophageal reflux disease)   . Hypertension   . Breast cancer   . MRSA (methicillin resistant Staphylococcus aureus) 2009    ALLERGIES:  has No Known Allergies.  MEDICATIONS:  Current Outpatient Prescriptions  Medication Sig Dispense Refill  . ALPRAZolam (XANAX) 1 MG tablet Take 2 mg by mouth at bedtime as needed for sleep.       Marland Kitchen amphetamine-dextroamphetamine (ADDERALL) 10 MG tablet Take 20 mg by mouth daily.       Marland Kitchen dexamethasone (DECADRON) 4 MG tablet Take 2 tablets by mouth once a day on the day after chemotherapy and then take 2 tablets two times a day for 2 days. Take with food.  30 tablet  1  . escitalopram (LEXAPRO) 20 MG tablet Take 40 mg by mouth daily.       Marland Kitchen esomeprazole (NEXIUM) 40 MG capsule Take 40 mg by mouth daily as needed. For reflux      . lidocaine-prilocaine (EMLA) cream Apply topically as needed.  30 g  7  . LORazepam (ATIVAN) 0.5 MG tablet Take 1 tablet (0.5 mg total) by mouth every 6 (six) hours as needed (Nausea or vomiting).  30 tablet  0  . Multiple Vitamin (MULTIVITAMIN) tablet Take 1 tablet by mouth daily.      . Omega-3 Fatty Acids (FISH OIL PO) Take 1 capsule by mouth daily.      . ondansetron (ZOFRAN)  8 MG tablet Take 1 tablet (8 mg total) by mouth 2 (two) times daily as needed. Take two times a day as needed for nausea or vomiting starting on the third day after chemotherapy.  30 tablet  1  . OVER THE COUNTER MEDICATION Take by mouth as needed.      Marland Kitchen OVER THE COUNTER MEDICATION Take by mouth as needed. OTC stool softner, as needed      . prochlorperazine (COMPAZINE) 10 MG tablet Take 1 tablet (10 mg total) by mouth every 6 (six) hours as needed (Nausea or vomiting).  30 tablet  1  . prochlorperazine (COMPAZINE) 25  MG suppository Place 1 suppository (25 mg total) rectally every 12 (twelve) hours as needed for nausea.  12 suppository  3  . valsartan-hydrochlorothiazide (DIOVAN-HCT) 320-25 MG per tablet Take 1 tablet by mouth daily.       . ciprofloxacin (CIPRO) 500 MG tablet Take 1 tablet (500 mg total) by mouth 2 (two) times daily.  14 tablet  6   No current facility-administered medications for this visit.   Facility-Administered Medications Ordered in Other Visits  Medication Dose Route Frequency Provider Last Rate Last Dose  . cyclophosphamide (CYTOXAN) 1,060 mg in sodium chloride 0.9 % 250 mL chemo infusion  600 mg/m2 (Treatment Plan Actual) Intravenous Once Victorino December, MD      . DOXOrubicin (ADRIAMYCIN) chemo injection 106 mg  60 mg/m2 (Treatment Plan Actual) Intravenous Once Victorino December, MD      . fosaprepitant (EMEND) 150 mg in sodium chloride 0.9 % 145 mL IVPB  150 mg Intravenous Once Victorino December, MD 300 mL/hr at 04/03/13 1532 150 mg at 04/03/13 1532  . heparin lock flush 100 unit/mL  500 Units Intracatheter Once PRN Victorino December, MD      . sodium chloride 0.9 % injection 10 mL  10 mL Intracatheter PRN Victorino December, MD        SURGICAL HISTORY:  Past Surgical History  Procedure Laterality Date  . Anal sphincterotomy  04/2011  . Hemorrhoid surgery  04/2011    ligation  . Breast lumpectomy with needle localization Left 02/04/2013    Procedure: LEFT BREAST LUMPECTOMY WITH NEEDLE LOCALIZATION;  Surgeon: Almond Lint, MD;  Location: [facility] OR;  Service: General;  Laterality: Left;  . Axillary lymph node dissection Left 02/04/2013    Procedure: LEFT AXILLARY LYMPH NODE DISSECTION;  Surgeon: Almond Lint, MD;  Location: [facility] OR;  Service: General;  Laterality: Left;  End: 1512  . Portacath placement Right 02/04/2013    Procedure: INSERTION PORT-A-CATH;  Surgeon: Almond Lint, MD;  Location: [facility] OR;  Service: General;  Laterality: Right;  Start Time: 1610.  Marland Kitchen Appendectomy  1980    REVIEW OF SYSTEMS:    General: fatigue (-), night sweats (-), fever (-), pain (-) Lymph: palpable nodes (-) HEENT: vision changes (-), mucositis (-), gum bleeding (-), epistaxis (-) Cardiovascular: chest pain (-), palpitations (-) Pulmonary: shortness of breath (-), dyspnea on exertion (-), cough (-), hemoptysis (-) GI:  Early satiety (-), melena (-), dysphagia (-), nausea/vomiting (-), diarrhea (-) GU: dysuria (-), hematuria (-), incontinence (-) Musculoskeletal: joint swelling (-), joint pain (-), back pain (-) Neuro: weakness (-), numbness (-), headache (-), confusion (-) Skin: Rash (-), lesions (-), dryness (-) Psych: depression (-), suicidal/homicidal ideation (-), feeling of hopelessness (-)   PHYSICAL EXAMINATION: Blood pressure 118/70, pulse 83, temperature 98.3 F (36.8 C), temperature source Oral, resp. rate 20, height 5\' 2"  (1.575  m), weight 160 lb 11.2 oz (72.893 kg). Body mass index is 29.38 kg/(m^2). General: Patient is a well appearing female in no acute distress HEENT: PERRLA, sclerae anicteric no conjunctival pallor, MMM Neck: supple, no palpable adenopathy Lungs: clear to auscultation bilaterally, no wheezes, rhonchi, or rales Cardiovascular: regular rate rhythm, S1, S2, no murmurs, rubs or gallops Abdomen: Soft, non-tender, non-distended, normoactive bowel sounds, no HSM Extremities: warm and well perfused, no clubbing, cyanosis, or edema Skin: No rashes or lesions Neuro: Non-focal Breasts: left breast lumpectomy site no nodularity, healing well, scar tissue underneath axillary incision, no erythema, swelling, drainage, right breast no masses or nodularity.  ECOG PERFORMANCE STATUS: 1 - Symptomatic but completely ambulatory  LABORATORY DATA: Lab Results  Component Value Date   WBC 6.9 04/03/2013   HGB 9.4* 04/03/2013   HCT 28.2* 04/03/2013   MCV 83.2 04/03/2013   PLT 151 04/03/2013      Chemistry      Component Value Date/Time   NA 140 03/26/2013 1349   NA 137 02/05/2013 0505   K  3.6 03/26/2013 1349   K 3.4* 02/05/2013 0505   CL 103 03/26/2013 1349   CL 102 02/05/2013 0505   CO2 28 03/26/2013 1349   CO2 27 02/05/2013 0505   BUN 14.4 03/26/2013 1349   BUN 8 02/05/2013 0505   CREATININE 0.8 03/26/2013 1349   CREATININE 0.86 02/05/2013 0505      Component Value Date/Time   CALCIUM 9.1 03/26/2013 1349   CALCIUM 8.0* 02/05/2013 0505   ALKPHOS 76 03/26/2013 1349   AST 17 03/26/2013 1349   ALT 22 03/26/2013 1349   BILITOT 0.36 03/26/2013 1349       RADIOGRAPHIC STUDIES:  No results found.  ASSESSMENT: 57 year old female with new diagnosis of   #1 multifocal invasive ductal carcinoma grade 3 ER weakly positive at 3% PR negative HER-2/neu negative with Ki-67 of 90% tumor is very aggressive. She does have a palpable lymph node as well in the left axilla. Patient was seen in the multidisciplinary clinic for discussion of treatment options.   #2 patient underwent a lumpectomy with axillary lymph node dissection with Dr. Luz Brazen on 02/04/13. A 1.6 cm tumor was removed with 1/19 lymph nodes positive for disease.    #3 She was recommended adjuvant chemotherapy consisting of Adriamycin Cytoxan given dose dense for total of 4 cycles followed by Taxol carboplatinum weekly for 12 weeks. Rationale for this aggressive treatment was discussed with the patient since she is essentially has a triple-negative disease.   #4 certainly patient will need post lumpectomy radiation therapy she was seen by radiation oncology. This will begin after patient has completed her chemotherapy.   #5 we discussed role of genetics, and she had an appointment with Maylon Cos on 03/06/13.     PLAN:  1. Doing well.  Ms. Brathwaite will proceed with chemotherapy today.    2. We will see Ms. Shatzer back next week for labs and an appointment to evaluate for chemotoxicites.   All questions were answered. The patient knows to call the clinic with any problems, questions or concerns. We can certainly see the patient much sooner  if necessary.  I spent 25 minutes counseling the patient face to face. The total time spent in the appointment was 30 minutes.  Kristi Ouch Lyn Hollingshead, NP Medical Oncology [facility] Phone: [phone] 04/03/2013, 3:40 PM

## 2013-04-03 NOTE — Patient Instructions (Signed)
Doing well.  Proceed with chemotherapy.  Please call us if you have any questions or concerns.    

## 2013-04-03 NOTE — Patient Instructions (Addendum)
Foods Rich in Potassium The body needs potassium to:  Control blood pressure.   Keep the muscles healthy.   Keep the nervous system healthy.  Most foods contain potassium. Eating a variety of foods in the right amounts will help control the level of potassium in your body.  Food / Potassium (mg)  Apricots, dried,  cup / 378 mg   Apricots, raw, 1 cup halves / 401 mg   Avocado,  / 487 mg   Banana, 1 large / 487 mg   Beef, lean, round, 3 oz / 202 mg   Cantaloupe, 1 cup cubes / 427 mg   Dates, medjool, 5 whole / 835 mg   Ham, cured, 3 oz / 212 mg   Lentils, dried,  cup / 458 mg   Lima beans, frozen,  cup / 258 mg   Orange, 1 large / 333 mg   Orange juice, 1 cup / 443 mg   Peaches, dried,  cup / 398 mg   Peas, split, cooked,  cup / 355 mg   Potato, boiled, 1 medium / 515 mg   Prunes, dried, uncooked,  cup / 318 mg   Raisins,  cup / 309 mg   Salmon, pink, raw, 3 oz / 275 mg   Sardines, canned , 3 oz / 338 mg   Tomato, raw, 1 medium / 292 mg   Tomato juice, 6 oz / 417 mg   [place], 3 oz / 349 mg  Other Foods High in Potassium (greater than 250 mg):  Bran cereals and other bran products.   Milk (skim, 1%, 2%, whole).   Buttermilk.   Yogurt.   Nuts.   Dried fruits.   Cherries.   Sweet potatoes.   Oranges.   Baked Beans.   Broccoli.   Spinach.   Peanut butter.   Tofu.  Foods Lower in Potassium (less than 250 mg):  Pasta.   Rice.   Cottage cheese.   Cheddar cheese.   Apples.   Mango.   Grapes.   Grapefruit.   Pineapple.   Raspberries.   Strawberries.   Watermelon.   Green Beans.   Cabbage.   Carrots.   Cauliflower.   Celery.   Corn.    Mushrooms.   Onions.   Squash.   Eggs.  The list below tells you how big or small some common portion sizes are:  1 oz.........4 stacked dice.   3 oz........Marland KitchenDeck of cards.   1 tsp.......Marland KitchenTip of little finger.   1 tbs......Marland KitchenMarland KitchenThumb.   2 tbs.......Marland KitchenGolf  ball.    cup......Marland KitchenHalf of a fist.   1 cup.......Marland KitchenA fist.  Document Released: 04/10/2008 Document Revised: 07/05/2011 Document Reviewed: 03/08/2009 [facility] Patient Information 2012 [place], [place].  [facility] Discharge Instructions for Patients Receiving Chemotherapy  Today you received the following chemotherapy agents Adriamycin/Cytoxan.  To help prevent nausea and vomiting after your treatment, we encourage you to take your nausea medication as directed.  If you develop nausea and vomiting that is not controlled by your nausea medication, call the clinic. If it is after clinic hours your family physician or the after hours number for the clinic or go to the Emergency Department.   BELOW ARE SYMPTOMS THAT SHOULD BE REPORTED IMMEDIATELY:  *FEVER GREATER THAN 100.5 F  *CHILLS WITH OR WITHOUT FEVER  NAUSEA AND VOMITING THAT IS NOT CONTROLLED WITH YOUR NAUSEA MEDICATION  *UNUSUAL SHORTNESS OF BREATH  *UNUSUAL BRUISING OR BLEEDING  TENDERNESS IN MOUTH AND THROAT WITH OR WITHOUT  PRESENCE OF ULCERS  *URINARY PROBLEMS  *BOWEL PROBLEMS  UNUSUAL RASH Items with * indicate a potential emergency and should be followed up as soon as possible.

## 2013-04-04 ENCOUNTER — Ambulatory Visit (HOSPITAL_BASED_OUTPATIENT_CLINIC_OR_DEPARTMENT_OTHER): Payer: BC Managed Care – PPO

## 2013-04-04 VITALS — BP 111/64 | HR 62 | Temp 98.3°F

## 2013-04-04 DIAGNOSIS — Z5189 Encounter for other specified aftercare: Secondary | ICD-10-CM

## 2013-04-04 DIAGNOSIS — C50212 Malignant neoplasm of upper-inner quadrant of left female breast: Secondary | ICD-10-CM

## 2013-04-04 DIAGNOSIS — C773 Secondary and unspecified malignant neoplasm of axilla and upper limb lymph nodes: Secondary | ICD-10-CM

## 2013-04-04 DIAGNOSIS — C50219 Malignant neoplasm of upper-inner quadrant of unspecified female breast: Secondary | ICD-10-CM

## 2013-04-04 MED ORDER — PEGFILGRASTIM INJECTION 6 MG/0.6ML
6.0000 mg | Freq: Once | SUBCUTANEOUS | Status: AC
  Administered 2013-04-04: 6 mg via SUBCUTANEOUS
  Filled 2013-04-04: qty 0.6

## 2013-04-08 ENCOUNTER — Telehealth: Payer: Self-pay | Admitting: Genetic Counselor

## 2013-04-08 NOTE — Telephone Encounter (Signed)
Revealed negative genetic testing other than a RAD51D VUS

## 2013-04-09 ENCOUNTER — Telehealth: Payer: Self-pay | Admitting: Oncology

## 2013-04-09 ENCOUNTER — Encounter: Payer: Self-pay | Admitting: Adult Health

## 2013-04-09 ENCOUNTER — Encounter: Payer: Self-pay | Admitting: Genetic Counselor

## 2013-04-09 ENCOUNTER — Telehealth: Payer: Self-pay | Admitting: *Deleted

## 2013-04-09 ENCOUNTER — Ambulatory Visit (HOSPITAL_BASED_OUTPATIENT_CLINIC_OR_DEPARTMENT_OTHER): Payer: BC Managed Care – PPO | Admitting: Adult Health

## 2013-04-09 ENCOUNTER — Other Ambulatory Visit (HOSPITAL_BASED_OUTPATIENT_CLINIC_OR_DEPARTMENT_OTHER): Payer: BC Managed Care – PPO | Admitting: Lab

## 2013-04-09 VITALS — BP 105/62 | HR 98 | Temp 98.6°F | Resp 20 | Ht 62.0 in | Wt 157.9 lb

## 2013-04-09 DIAGNOSIS — D709 Neutropenia, unspecified: Secondary | ICD-10-CM

## 2013-04-09 DIAGNOSIS — C50919 Malignant neoplasm of unspecified site of unspecified female breast: Secondary | ICD-10-CM

## 2013-04-09 DIAGNOSIS — C773 Secondary and unspecified malignant neoplasm of axilla and upper limb lymph nodes: Secondary | ICD-10-CM

## 2013-04-09 DIAGNOSIS — C50212 Malignant neoplasm of upper-inner quadrant of left female breast: Secondary | ICD-10-CM

## 2013-04-09 DIAGNOSIS — E876 Hypokalemia: Secondary | ICD-10-CM

## 2013-04-09 DIAGNOSIS — R5383 Other fatigue: Secondary | ICD-10-CM

## 2013-04-09 DIAGNOSIS — R5381 Other malaise: Secondary | ICD-10-CM

## 2013-04-09 LAB — COMPREHENSIVE METABOLIC PANEL (CC13)
Alkaline Phosphatase: 84 U/L (ref 40–150)
BUN: 15.6 mg/dL (ref 7.0–26.0)
CO2: 29 mEq/L (ref 22–29)
Glucose: 110 mg/dl — ABNORMAL HIGH (ref 70–99)
Sodium: 139 mEq/L (ref 136–145)
Total Bilirubin: 0.5 mg/dL (ref 0.20–1.20)
Total Protein: 6.3 g/dL — ABNORMAL LOW (ref 6.4–8.3)

## 2013-04-09 LAB — CBC WITH DIFFERENTIAL/PLATELET
Basophils Absolute: 0 10*3/uL (ref 0.0–0.1)
Eosinophils Absolute: 0 10*3/uL (ref 0.0–0.5)
HCT: 26.8 % — ABNORMAL LOW (ref 34.8–46.6)
HGB: 9.1 g/dL — ABNORMAL LOW (ref 11.6–15.9)
LYMPH%: 47.6 % (ref 14.0–49.7)
MCV: 85 fL (ref 79.5–101.0)
MONO#: 0 10*3/uL — ABNORMAL LOW (ref 0.1–0.9)
MONO%: 3 % (ref 0.0–14.0)
NEUT#: 0.6 10*3/uL — ABNORMAL LOW (ref 1.5–6.5)
NEUT%: 48.5 % (ref 38.4–76.8)
Platelets: 151 10*3/uL (ref 145–400)
WBC: 1.3 10*3/uL — ABNORMAL LOW (ref 3.9–10.3)

## 2013-04-09 MED ORDER — POTASSIUM CHLORIDE CRYS ER 20 MEQ PO TBCR: 20.0000 meq | EXTENDED_RELEASE_TABLET | Freq: Every day | ORAL | Status: DC

## 2013-04-09 NOTE — Telephone Encounter (Signed)
Per staff message and POF I have scheduled appts.  JMW  

## 2013-04-09 NOTE — Patient Instructions (Signed)
  Patient Neutropenia Instruction Sheet  Diagnosis: Breast Cancer      Treating Physician: Drue Second, MD  Treatment: 1. Type of chemotherapy: Adriamycin/Cytoxan 2. Date of last treatment: 5/28  Last Blood Counts: Lab Results  Component Value Date   WBC 1.3* 04/09/2013   HGB 9.1* 04/09/2013   HCT 26.8* 04/09/2013   MCV 85.0 04/09/2013   PLT 151 04/09/2013   ANC 600     Prophylactic Antibiotics: Cipro 500 mg by mouth twice a day Instructions: 1. Monitor temperature and call if fever  greater than 100.5, chills, shaking chills (rigors) 2. Call Physician on-call at [phone] 3. Give him/her symptoms and list of medications that you are taking and your last blood count.

## 2013-04-09 NOTE — Progress Notes (Signed)
OFFICE PROGRESS NOTE  CC**  [name],[name], MD [address], [address] [place] [address]  DIAGNOSIS: 57 year old female with stage IIA ER weakly positive, PR negative HER-2/neu negative, invasive ductal carcinoma of the left breast.   PRIOR THERAPY: 1. Earlier this year it on routine screening mammography the patient was noted to have a abnormality within the 12:00 position of the left breast. Patient returned for additional imaging with 3-D, tomosynthesis. Within the left breast there were 2 masses somewhat lobulated with irregular margins in the upper inner aspect of the left breast. These were separated by approximately a centimeter. On ultrasound the lesion the 11:00 position measured 1.0 x 0.7 cm. The 12:00 lesion measured 1.6 x 0.9 cm. In addition within the axillary region there was a 2.1 x 1.7 suspicious appearing lymph node.. The patient proceeded to undergo ultrasound-guided biopsy of the 2 areas within the left breast as well as the suspicious left axillary lymph node. Biopsies from both breast areas revealed invasive ductal carcinoma. In addition the lymph node from the left axilla revealed ductal carcinoma. The lesions appeared to be high-grade. There was no HER-2/neu amplification and hormone staining was minimal consistent with essentially consistent with triple negative breast cancer.patient's case was discussed at the multidisciplinary breast conference her pathology and radiology were reviewed  2. Patient underwent lumpectomy of the left breast with Dr. Donell Beers on 02/04/13, a 1.6cm tumor was removed along with an axillary lymph node dissection that found 1/19 nodes positive for metastatic disease.    3. Patient was then started on adjuvant chemotherapy on 03/05/13 with Adriamycin/Cytoxan.   CURRENT THERAPY: Adriamycin Cytoxan Cycle 3 day 8  INTERVAL HISTORY: Kristi [name] 57 y.o. female returns for follow up following her third cycle of adjuvant Adriamycin/Cytoxan.  She  is doing well.   She is fatigued today.  She is drinking fluids and has been trying to eat foods high in potassium.  Otherwise, she denies fevers, chills, nausea, vomiting, constipation, diarrhea, numbness or any other concerns.    MEDICAL HISTORY: Past Medical History  Diagnosis Date  . Anxiety   . GERD (gastroesophageal reflux disease)   . Hypertension   . Breast cancer   . MRSA (methicillin resistant Staphylococcus aureus) 2009    ALLERGIES:  has No Known Allergies.  MEDICATIONS:  Current Outpatient Prescriptions  Medication Sig Dispense Refill  . ALPRAZolam (XANAX) 1 MG tablet Take 2 mg by mouth at bedtime as needed for sleep.       Marland Kitchen amphetamine-dextroamphetamine (ADDERALL) 10 MG tablet Take 20 mg by mouth daily.       . ciprofloxacin (CIPRO) 500 MG tablet Take 1 tablet (500 mg total) by mouth 2 (two) times daily.  14 tablet  6  . dexamethasone (DECADRON) 4 MG tablet Take 2 tablets by mouth once a day on the day after chemotherapy and then take 2 tablets two times a day for 2 days. Take with food.  30 tablet  1  . escitalopram (LEXAPRO) 20 MG tablet Take 40 mg by mouth daily.       Marland Kitchen esomeprazole (NEXIUM) 40 MG capsule Take 40 mg by mouth daily as needed. For reflux      . lidocaine-prilocaine (EMLA) cream Apply topically as needed.  30 g  7  . LORazepam (ATIVAN) 0.5 MG tablet Take 1 tablet (0.5 mg total) by mouth every 6 (six) hours as needed (Nausea or vomiting).  30 tablet  0  . Multiple Vitamin (MULTIVITAMIN) tablet Take  1 tablet by mouth daily.      . Omega-3 Fatty Acids (FISH OIL PO) Take 1 capsule by mouth daily.      . ondansetron (ZOFRAN) 8 MG tablet Take 1 tablet (8 mg total) by mouth 2 (two) times daily as needed. Take two times a day as needed for nausea or vomiting starting on the third day after chemotherapy.  30 tablet  1  . OVER THE COUNTER MEDICATION Take by mouth as needed.      Marland Kitchen OVER THE COUNTER MEDICATION Take by mouth as needed. OTC stool softner, as needed       . potassium chloride SA (K-DUR,KLOR-CON) 20 MEQ tablet Take 1 tablet (20 mEq total) by mouth daily.  7 tablet  0  . prochlorperazine (COMPAZINE) 10 MG tablet Take 1 tablet (10 mg total) by mouth every 6 (six) hours as needed (Nausea or vomiting).  30 tablet  1  . prochlorperazine (COMPAZINE) 25 MG suppository Place 1 suppository (25 mg total) rectally every 12 (twelve) hours as needed for nausea.  12 suppository  3  . valsartan-hydrochlorothiazide (DIOVAN-HCT) 320-25 MG per tablet Take 1 tablet by mouth daily.        No current facility-administered medications for this visit.   Facility-Administered Medications Ordered in Other Visits  Medication Dose Route Frequency Provider Last Rate Last Dose  . sodium chloride 0.9 % injection 10 mL  10 mL Intracatheter PRN Victorino December, MD   10 mL at 04/03/13 1714    SURGICAL HISTORY:  Past Surgical History  Procedure Laterality Date  . Anal sphincterotomy  04/2011  . Hemorrhoid surgery  04/2011    ligation  . Breast lumpectomy with needle localization Left 02/04/2013    Procedure: LEFT BREAST LUMPECTOMY WITH NEEDLE LOCALIZATION;  Surgeon: Almond Lint, MD;  Location: [facility];  Service: General;  Laterality: Left;  . Axillary lymph node dissection Left 02/04/2013    Procedure: LEFT AXILLARY LYMPH NODE DISSECTION;  Surgeon: Almond Lint, MD;  Location: [facility] OR;  Service: General;  Laterality: Left;  End: 1512  . Portacath placement Right 02/04/2013    Procedure: INSERTION PORT-A-CATH;  Surgeon: Almond Lint, MD;  Location: [facility] OR;  Service: General;  Laterality: Right;  Start Time: 4540.  Marland Kitchen Appendectomy  1980    REVIEW OF SYSTEMS:   General: fatigue (-), night sweats (-), fever (-), pain (-) Lymph: palpable nodes (-) HEENT: vision changes (-), mucositis (-), gum bleeding (-), epistaxis (-) Cardiovascular: chest pain (-), palpitations (-) Pulmonary: shortness of breath (-), dyspnea on exertion (-), cough (-), hemoptysis (-) GI:  Early satiety (-), melena  (-), dysphagia (-), nausea/vomiting (-), diarrhea (-) GU: dysuria (-), hematuria (-), incontinence (-) Musculoskeletal: joint swelling (-), joint pain (-), back pain (-) Neuro: weakness (-), numbness (-), headache (-), confusion (-) Skin: Rash (-), lesions (-), dryness (-) Psych: depression (-), suicidal/homicidal ideation (-), feeling of hopelessness (-)   PHYSICAL EXAMINATION: Blood pressure 105/62, pulse 98, temperature 98.6 F (37 C), temperature source Oral, resp. rate 20, height 5\' 2"  (1.575 m), weight 157 lb 14.4 oz (71.623 kg). Body mass index is 28.87 kg/(m^2). General: Patient is a well appearing female in no acute distress HEENT: PERRLA, sclerae anicteric no conjunctival pallor, MMM Neck: supple, no palpable adenopathy Lungs: clear to auscultation bilaterally, no wheezes, rhonchi, or rales Cardiovascular: regular rate rhythm, S1, S2, no murmurs, rubs or gallops Abdomen: Soft, non-tender, non-distended, normoactive bowel sounds, no HSM Extremities: warm and well perfused, no clubbing, cyanosis,  or edema Skin: No rashes or lesions Neuro: Non-focal Breasts: left breast lumpectomy site no nodularity, healing well, scar tissue underneath axillary incision, no erythema, swelling, drainage, right breast no masses or nodularity.  ECOG PERFORMANCE STATUS: 1 - Symptomatic but completely ambulatory  LABORATORY DATA: Lab Results  Component Value Date   WBC 1.3* 04/09/2013   HGB 9.1* 04/09/2013   HCT 26.8* 04/09/2013   MCV 85.0 04/09/2013   PLT 151 04/09/2013      Chemistry      Component Value Date/Time   NA 139 04/09/2013 1321   NA 137 02/05/2013 0505   K 3.4* 04/09/2013 1321   K 3.4* 02/05/2013 0505   CL 103 04/09/2013 1321   CL 102 02/05/2013 0505   CO2 29 04/09/2013 1321   CO2 27 02/05/2013 0505   BUN 15.6 04/09/2013 1321   BUN 8 02/05/2013 0505   CREATININE 0.7 04/09/2013 1321   CREATININE 0.86 02/05/2013 0505      Component Value Date/Time   CALCIUM 9.2 04/09/2013 1321   CALCIUM 8.0* 02/05/2013  0505   ALKPHOS 84 04/09/2013 1321   AST 15 04/09/2013 1321   ALT 18 04/09/2013 1321   BILITOT 0.50 04/09/2013 1321       RADIOGRAPHIC STUDIES:  No results found.  ASSESSMENT: 57 year old female with new diagnosis of   #1 multifocal invasive ductal carcinoma grade 3 ER weakly positive at 3% PR negative HER-2/neu negative with Ki-67 of 90% tumor is very aggressive. She does have a palpable lymph node as well in the left axilla. Patient was seen in the multidisciplinary clinic for discussion of treatment options.   #2 patient underwent a lumpectomy with axillary lymph node dissection with Dr. Luz Brazen on 02/04/13. A 1.6 cm tumor was removed with 1/19 lymph nodes positive for disease.    #3 She was recommended adjuvant chemotherapy consisting of Adriamycin Cytoxan given dose dense for total of 4 cycles followed by Taxol carboplatinum weekly for 12 weeks. Rationale for this aggressive treatment was discussed with the patient since she is essentially has a triple-negative disease.   #4 certainly patient will need post lumpectomy radiation therapy she was seen by radiation oncology. This will begin after patient has completed her chemotherapy.   #5 we discussed role of genetics, and she had an appointment with Maylon Cos on 03/06/13 the results were negative.       PLAN:  1. Doing well.  Kristi [name] is subsequently neutropenic after chemotherapy.  I reviewed neutropenic precautions with her in detail and gave her the information in her AVS.   2. We will see Kristi [name] back next week for cycle 4 of chemotherapy.  We went ahead and requested scheduling of the first 8 weeks of Taxol Carbo today.    All questions were answered. The patient knows to call the clinic with any problems, questions or concerns. We can certainly see the patient much sooner if necessary.  I spent 25 minutes counseling the patient face to face. The total time spent in the appointment was 30 minutes.  Cherie Ouch Lyn Hollingshead,  NP Medical Oncology [facility] Phone: [phone] 04/09/2013, 4:46 PM

## 2013-04-12 DIAGNOSIS — K6289 Other specified diseases of anus and rectum: Secondary | ICD-10-CM | POA: Insufficient documentation

## 2013-04-12 DIAGNOSIS — H9209 Otalgia, unspecified ear: Secondary | ICD-10-CM | POA: Insufficient documentation

## 2013-04-12 DIAGNOSIS — L723 Sebaceous cyst: Secondary | ICD-10-CM | POA: Insufficient documentation

## 2013-04-12 DIAGNOSIS — R7309 Other abnormal glucose: Secondary | ICD-10-CM | POA: Insufficient documentation

## 2013-04-12 DIAGNOSIS — B349 Viral infection, unspecified: Secondary | ICD-10-CM | POA: Insufficient documentation

## 2013-04-12 DIAGNOSIS — I10 Essential (primary) hypertension: Secondary | ICD-10-CM

## 2013-04-12 DIAGNOSIS — R110 Nausea: Secondary | ICD-10-CM | POA: Insufficient documentation

## 2013-04-12 HISTORY — DX: Sebaceous cyst: L72.3

## 2013-04-12 HISTORY — DX: Other specified diseases of anus and rectum: K62.89

## 2013-04-14 ENCOUNTER — Other Ambulatory Visit: Payer: BC Managed Care – PPO | Admitting: Lab

## 2013-04-14 ENCOUNTER — Encounter: Payer: BC Managed Care – PPO | Admitting: Genetic Counselor

## 2013-04-17 ENCOUNTER — Other Ambulatory Visit (HOSPITAL_BASED_OUTPATIENT_CLINIC_OR_DEPARTMENT_OTHER): Payer: BC Managed Care – PPO | Admitting: Lab

## 2013-04-17 ENCOUNTER — Ambulatory Visit (HOSPITAL_BASED_OUTPATIENT_CLINIC_OR_DEPARTMENT_OTHER): Payer: BC Managed Care – PPO

## 2013-04-17 ENCOUNTER — Other Ambulatory Visit: Payer: BC Managed Care – PPO | Admitting: Lab

## 2013-04-17 ENCOUNTER — Encounter: Payer: Self-pay | Admitting: Adult Health

## 2013-04-17 ENCOUNTER — Ambulatory Visit (HOSPITAL_BASED_OUTPATIENT_CLINIC_OR_DEPARTMENT_OTHER): Payer: BC Managed Care – PPO | Admitting: Adult Health

## 2013-04-17 VITALS — BP 115/77 | HR 97 | Temp 98.1°F | Resp 20 | Ht 62.0 in | Wt 157.3 lb

## 2013-04-17 DIAGNOSIS — C50212 Malignant neoplasm of upper-inner quadrant of left female breast: Secondary | ICD-10-CM

## 2013-04-17 DIAGNOSIS — C50219 Malignant neoplasm of upper-inner quadrant of unspecified female breast: Secondary | ICD-10-CM

## 2013-04-17 DIAGNOSIS — Z5111 Encounter for antineoplastic chemotherapy: Secondary | ICD-10-CM

## 2013-04-17 DIAGNOSIS — C773 Secondary and unspecified malignant neoplasm of axilla and upper limb lymph nodes: Secondary | ICD-10-CM

## 2013-04-17 DIAGNOSIS — C50919 Malignant neoplasm of unspecified site of unspecified female breast: Secondary | ICD-10-CM

## 2013-04-17 LAB — COMPREHENSIVE METABOLIC PANEL (CC13)
Albumin: 3.5 g/dL (ref 3.5–5.0)
BUN: 9.3 mg/dL (ref 7.0–26.0)
CO2: 28 mEq/L (ref 22–29)
Calcium: 9.6 mg/dL (ref 8.4–10.4)
Chloride: 102 mEq/L (ref 98–107)
Creatinine: 0.8 mg/dL (ref 0.6–1.1)
Glucose: 117 mg/dl — ABNORMAL HIGH (ref 70–99)
Potassium: 3.1 mEq/L — ABNORMAL LOW (ref 3.5–5.1)

## 2013-04-17 LAB — CBC WITH DIFFERENTIAL/PLATELET
Basophils Absolute: 0 10*3/uL (ref 0.0–0.1)
HCT: 28.2 % — ABNORMAL LOW (ref 34.8–46.6)
HGB: 9.3 g/dL — ABNORMAL LOW (ref 11.6–15.9)
LYMPH%: 15 % (ref 14.0–49.7)
MCH: 27.5 pg (ref 25.1–34.0)
MCHC: 33 g/dL (ref 31.5–36.0)
MONO#: 1.1 10*3/uL — ABNORMAL HIGH (ref 0.1–0.9)
NEUT%: 70.9 % (ref 38.4–76.8)
Platelets: 191 10*3/uL (ref 145–400)
WBC: 7.8 10*3/uL (ref 3.9–10.3)
lymph#: 1.2 10*3/uL (ref 0.9–3.3)

## 2013-04-17 MED ORDER — SODIUM CHLORIDE 0.9 % IV SOLN
Freq: Once | INTRAVENOUS | Status: AC
  Administered 2013-04-17: 13:00:00 via INTRAVENOUS

## 2013-04-17 MED ORDER — SODIUM CHLORIDE 0.9 % IJ SOLN
10.0000 mL | INTRAMUSCULAR | Status: DC | PRN
  Administered 2013-04-17: 10 mL
  Filled 2013-04-17: qty 10

## 2013-04-17 MED ORDER — LORAZEPAM 2 MG/ML IJ SOLN
0.5000 mg | Freq: Once | INTRAMUSCULAR | Status: AC
  Administered 2013-04-17: 13:00:00 via INTRAVENOUS

## 2013-04-17 MED ORDER — PALONOSETRON HCL INJECTION 0.25 MG/5ML
0.2500 mg | Freq: Once | INTRAVENOUS | Status: AC
  Administered 2013-04-17: 0.25 mg via INTRAVENOUS

## 2013-04-17 MED ORDER — SODIUM CHLORIDE 0.9 % IV SOLN
600.0000 mg/m2 | Freq: Once | INTRAVENOUS | Status: AC
  Administered 2013-04-17: 1060 mg via INTRAVENOUS
  Filled 2013-04-17: qty 53

## 2013-04-17 MED ORDER — DOXORUBICIN HCL CHEMO IV INJECTION 2 MG/ML
60.0000 mg/m2 | Freq: Once | INTRAVENOUS | Status: AC
  Administered 2013-04-17: 106 mg via INTRAVENOUS
  Filled 2013-04-17: qty 53

## 2013-04-17 MED ORDER — SODIUM CHLORIDE 0.9 % IV SOLN
150.0000 mg | Freq: Once | INTRAVENOUS | Status: AC
  Administered 2013-04-17: 150 mg via INTRAVENOUS
  Filled 2013-04-17: qty 5

## 2013-04-17 MED ORDER — DEXAMETHASONE SODIUM PHOSPHATE 20 MG/5ML IJ SOLN
12.0000 mg | Freq: Once | INTRAMUSCULAR | Status: AC
  Administered 2013-04-17: 13:00:00 via INTRAVENOUS

## 2013-04-17 MED ORDER — HEPARIN SOD (PORK) LOCK FLUSH 100 UNIT/ML IV SOLN
500.0000 [IU] | Freq: Once | INTRAVENOUS | Status: AC | PRN
  Administered 2013-04-17: 500 [IU]
  Filled 2013-04-17: qty 5

## 2013-04-17 NOTE — Progress Notes (Signed)
Discharged to home with spouse, ambulatory in no distress.

## 2013-04-17 NOTE — Patient Instructions (Signed)
[facility] Discharge Instructions for Patients Receiving Chemotherapy  Today you received the following chemotherapy agents Adriamycin, cytoxan.  To help prevent nausea and vomiting after your treatment, we encourage you to take your nausea medication compazine 10 mg, zofran 8 mg, ativan as ordered by Dr. Welton Flakes.   If you develop nausea and vomiting that is not controlled by your nausea medication, call the clinic.   BELOW ARE SYMPTOMS THAT SHOULD BE REPORTED IMMEDIATELY:  *FEVER GREATER THAN 100.5 F  *CHILLS WITH OR WITHOUT FEVER  NAUSEA AND VOMITING THAT IS NOT CONTROLLED WITH YOUR NAUSEA MEDICATION  *UNUSUAL SHORTNESS OF BREATH  *UNUSUAL BRUISING OR BLEEDING  TENDERNESS IN MOUTH AND THROAT WITH OR WITHOUT PRESENCE OF ULCERS  *URINARY PROBLEMS  *BOWEL PROBLEMS  UNUSUAL RASH Items with * indicate a potential emergency and should be followed up as soon as possible.  Feel free to call the clinic you have any questions or concerns. The clinic phone number is [phone].

## 2013-04-17 NOTE — Progress Notes (Signed)
OFFICE PROGRESS NOTE  CC**  [name],[name], MD [address], [address] [place] [address]  DIAGNOSIS: 57 year old female with stage IIA ER weakly positive, PR negative HER-2/neu negative, invasive ductal carcinoma of the left breast.   PRIOR THERAPY: 1. Earlier this year it on routine screening mammography the patient was noted to have a abnormality within the 12:00 position of the left breast. Patient returned for additional imaging with 3-[name], tomosynthesis. Within the left breast there were 2 masses somewhat lobulated with irregular margins in the upper inner aspect of the left breast. These were separated by approximately a centimeter. On ultrasound the lesion the 11:00 position measured 1.0 x 0.7 cm. The 12:00 lesion measured 1.6 x 0.9 cm. In addition within the axillary region there was a 2.1 x 1.7 suspicious appearing lymph node.. The patient proceeded to undergo ultrasound-guided biopsy of the 2 areas within the left breast as well as the suspicious left axillary lymph node. Biopsies from both breast areas revealed invasive ductal carcinoma. In addition the lymph node from the left axilla revealed ductal carcinoma. The lesions appeared to be high-grade. There was no HER-2/neu amplification and hormone staining was minimal consistent with essentially consistent with triple negative breast cancer.  Patient's case was discussed at the multidisciplinary breast conference her pathology and radiology were reviewed.  2. Patient underwent lumpectomy of the left breast with Dr. Donell Beers on 02/04/13, a 1.6cm tumor was removed along with an axillary lymph node dissection that found 1/19 nodes positive for metastatic disease.    3. Patient was then started on adjuvant chemotherapy on 03/05/13 with Adriamycin/Cytoxan.   CURRENT THERAPY: Adriamycin Cytoxan Cycle 4 day 1  INTERVAL HISTORY: Kristi [name] 57 y.o. female returns for evaluation prior to cycle four of Adriamycin/Cytoxan.  She is feeling  well.  She does have tenderness in her toes and fingertips, and the skin is cracking slightly in her fingertips.  She denies numbness/tingling though.  She has no motor deficits.  She denies fevers, chills, nausea, vomiting, constipation, diarrhea, or further skin changes.  Otherwise, a 10 point ROS is negative.   MEDICAL HISTORY: Past Medical History  Diagnosis Date  . Anxiety   . GERD (gastroesophageal reflux disease)   . Hypertension   . Breast cancer   . MRSA (methicillin resistant Staphylococcus aureus) 2009    ALLERGIES:  has No Known Allergies.  MEDICATIONS:  Current Outpatient Prescriptions  Medication Sig Dispense Refill  . ALPRAZolam (XANAX) 1 MG tablet Take 2 mg by mouth at bedtime as needed for sleep.       Marland Kitchen amphetamine-dextroamphetamine (ADDERALL) 10 MG tablet Take 20 mg by mouth daily.       . ciprofloxacin (CIPRO) 500 MG tablet Take 1 tablet (500 mg total) by mouth 2 (two) times daily.  14 tablet  6  . dexamethasone (DECADRON) 4 MG tablet Take 2 tablets by mouth once a day on the day after chemotherapy and then take 2 tablets two times a day for 2 days. Take with food.  30 tablet  1  . escitalopram (LEXAPRO) 20 MG tablet Take 40 mg by mouth daily.       Marland Kitchen esomeprazole (NEXIUM) 40 MG capsule Take 40 mg by mouth daily as needed. For reflux      . lidocaine-prilocaine (EMLA) cream Apply topically as needed.  30 g  7  . LORazepam (ATIVAN) 0.5 MG tablet Take 1 tablet (0.5 mg total) by mouth every 6 (six) hours as needed (Nausea  or vomiting).  30 tablet  0  . Multiple Vitamin (MULTIVITAMIN) tablet Take 1 tablet by mouth daily.      . Omega-3 Fatty Acids (FISH OIL PO) Take 1 capsule by mouth daily.      . ondansetron (ZOFRAN) 8 MG tablet Take 1 tablet (8 mg total) by mouth 2 (two) times daily as needed. Take two times a day as needed for nausea or vomiting starting on the third day after chemotherapy.  30 tablet  1  . OVER THE COUNTER MEDICATION Take by mouth as needed.      Marland Kitchen  OVER THE COUNTER MEDICATION Take by mouth as needed. OTC stool softner, as needed      . potassium chloride SA (K-DUR,KLOR-CON) 20 MEQ tablet Take 1 tablet (20 mEq total) by mouth daily.  7 tablet  0  . prochlorperazine (COMPAZINE) 10 MG tablet Take 1 tablet (10 mg total) by mouth every 6 (six) hours as needed (Nausea or vomiting).  30 tablet  1  . prochlorperazine (COMPAZINE) 25 MG suppository Place 1 suppository (25 mg total) rectally every 12 (twelve) hours as needed for nausea.  12 suppository  3  . valsartan-hydrochlorothiazide (DIOVAN-HCT) 320-25 MG per tablet Take 1 tablet by mouth daily.        No current facility-administered medications for this visit.   Facility-Administered Medications Ordered in Other Visits  Medication Dose Route Frequency Provider Last Rate Last Dose  . sodium chloride 0.9 % injection 10 mL  10 mL Intracatheter PRN Victorino December, MD   10 mL at 04/03/13 1714    SURGICAL HISTORY:  Past Surgical History  Procedure Laterality Date  . Anal sphincterotomy  04/2011  . Hemorrhoid surgery  04/2011    ligation  . Breast lumpectomy with needle localization Left 02/04/2013    Procedure: LEFT BREAST LUMPECTOMY WITH NEEDLE LOCALIZATION;  Surgeon: Almond Lint, MD;  Location: [facility] OR;  Service: General;  Laterality: Left;  . Axillary lymph node dissection Left 02/04/2013    Procedure: LEFT AXILLARY LYMPH NODE DISSECTION;  Surgeon: Almond Lint, MD;  Location: [facility] OR;  Service: General;  Laterality: Left;  End: 1512  . Portacath placement Right 02/04/2013    Procedure: INSERTION PORT-A-CATH;  Surgeon: Almond Lint, MD;  Location: [facility] OR;  Service: General;  Laterality: Right;  Start Time: 1610.  Marland Kitchen Appendectomy  1980    REVIEW OF SYSTEMS:   General: fatigue (-), night sweats (-), fever (-), pain (-) Lymph: palpable nodes (-) HEENT: vision changes (-), mucositis (-), gum bleeding (-), epistaxis (-) Cardiovascular: chest pain (-), palpitations (-) Pulmonary: shortness of breath  (-), dyspnea on exertion (-), cough (-), hemoptysis (-) GI:  Early satiety (-), melena (-), dysphagia (-), nausea/vomiting (-), diarrhea (-) GU: dysuria (-), hematuria (-), incontinence (-) Musculoskeletal: joint swelling (-), joint pain (-), back pain (-) Neuro: weakness (-), numbness (-), headache (-), confusion (-) Skin: Rash (-), lesions (-), dryness (-) Psych: depression (-), suicidal/homicidal ideation (-), feeling of hopelessness (-)   PHYSICAL EXAMINATION: Blood pressure 115/77, pulse 97, temperature 98.1 F (36.7 C), temperature source Oral, resp. rate 20, height 5\' 2"  (1.575 m), weight 157 lb 4.8 oz (71.351 kg). Body mass index is 28.76 kg/(m^2). General: Patient is a well appearing female in no acute distress HEENT: PERRLA, sclerae anicteric no conjunctival pallor, MMM Neck: supple, no palpable adenopathy Lungs: clear to auscultation bilaterally, no wheezes, rhonchi, or rales Cardiovascular: regular rate rhythm, S1, S2, no murmurs, rubs or gallops Abdomen: Soft, non-tender,  non-distended, normoactive bowel sounds, no HSM Extremities: warm and well perfused, no clubbing, cyanosis, or edema Skin: No rashes or lesions Neuro: Non-focal Breasts: left breast lumpectomy site no nodularity, healing well, scar tissue underneath axillary incision, no erythema, swelling, drainage, right breast no masses or nodularity.  ECOG PERFORMANCE STATUS: 1 - Symptomatic but completely ambulatory  LABORATORY DATA: Lab Results  Component Value Date   WBC 7.8 04/17/2013   HGB 9.3* 04/17/2013   HCT 28.2* 04/17/2013   MCV 83.4 04/17/2013   PLT 191 04/17/2013      Chemistry      Component Value Date/Time   NA 139 04/09/2013 1321   NA 137 02/05/2013 0505   K 3.4* 04/09/2013 1321   K 3.4* 02/05/2013 0505   CL 103 04/09/2013 1321   CL 102 02/05/2013 0505   CO2 29 04/09/2013 1321   CO2 27 02/05/2013 0505   BUN 15.6 04/09/2013 1321   BUN 8 02/05/2013 0505   CREATININE 0.7 04/09/2013 1321   CREATININE 0.86 02/05/2013  0505      Component Value Date/Time   CALCIUM 9.2 04/09/2013 1321   CALCIUM 8.0* 02/05/2013 0505   ALKPHOS 84 04/09/2013 1321   AST 15 04/09/2013 1321   ALT 18 04/09/2013 1321   BILITOT 0.50 04/09/2013 1321       RADIOGRAPHIC STUDIES:  No results found.  ASSESSMENT: 57 year old female with new diagnosis of   #1 multifocal invasive ductal carcinoma grade 3 ER weakly positive at 3% PR negative HER-2/neu negative with Ki-67 of 90% tumor is very aggressive. She does have a palpable lymph node as well in the left axilla. Patient was seen in the multidisciplinary clinic for discussion of treatment options.   #2 patient underwent a lumpectomy with axillary lymph node dissection with Dr. Luz Brazen on 02/04/13. A 1.6 cm tumor was removed with 1/19 lymph nodes positive for disease.    #3 She was recommended adjuvant chemotherapy consisting of Adriamycin Cytoxan given dose dense for total of 4 cycles followed by Taxol carboplatinum weekly for 12 weeks. Rationale for this aggressive treatment was discussed with the patient since she is essentially has a triple-negative disease.   #4 certainly patient will need post lumpectomy radiation therapy she was seen by radiation oncology. This will begin after patient has completed her chemotherapy.   #5 we discussed role of genetics, and she had an appointment with Maylon Cos on 03/06/13 the results were negative.       PLAN:  1. Doing well.  She will proceed with chemotherapy.  I recommended she take Super B complex daily.  She will also soak her toes and fingertips in tea tree oil.    2. We will see Kristi [name] back next week for labs and an office visit for evaluation of chemotoxicities.   All questions were answered. The patient knows to call the clinic with any problems, questions or concerns. We can certainly see the patient much sooner if necessary.  I spent 25 minutes counseling the patient face to face. The total time spent in the appointment was 30  minutes.  Cherie Ouch Lyn Hollingshead, NP Medical Oncology [facility] Phone: [phone] 04/17/2013, 11:17 AM

## 2013-04-17 NOTE — Patient Instructions (Addendum)
Take Super B complex daily.  Doing well Proceed with chemotherapy.  Please call us if you have any questions or concerns.

## 2013-04-18 ENCOUNTER — Ambulatory Visit (HOSPITAL_BASED_OUTPATIENT_CLINIC_OR_DEPARTMENT_OTHER): Payer: BC Managed Care – PPO

## 2013-04-18 VITALS — BP 104/60 | HR 69 | Temp 98.2°F

## 2013-04-18 DIAGNOSIS — C50212 Malignant neoplasm of upper-inner quadrant of left female breast: Secondary | ICD-10-CM

## 2013-04-18 DIAGNOSIS — Z5189 Encounter for other specified aftercare: Secondary | ICD-10-CM

## 2013-04-18 DIAGNOSIS — C50219 Malignant neoplasm of upper-inner quadrant of unspecified female breast: Secondary | ICD-10-CM

## 2013-04-18 MED ORDER — PEGFILGRASTIM INJECTION 6 MG/0.6ML
6.0000 mg | Freq: Once | SUBCUTANEOUS | Status: AC
  Administered 2013-04-18: 6 mg via SUBCUTANEOUS
  Filled 2013-04-18: qty 0.6

## 2013-04-23 ENCOUNTER — Other Ambulatory Visit (HOSPITAL_BASED_OUTPATIENT_CLINIC_OR_DEPARTMENT_OTHER): Payer: BC Managed Care – PPO | Admitting: Lab

## 2013-04-23 ENCOUNTER — Ambulatory Visit (HOSPITAL_BASED_OUTPATIENT_CLINIC_OR_DEPARTMENT_OTHER): Payer: BC Managed Care – PPO | Admitting: Adult Health

## 2013-04-23 ENCOUNTER — Telehealth: Payer: Self-pay

## 2013-04-23 ENCOUNTER — Encounter: Payer: Self-pay | Admitting: Adult Health

## 2013-04-23 ENCOUNTER — Other Ambulatory Visit: Payer: Self-pay | Admitting: *Deleted

## 2013-04-23 VITALS — BP 95/58 | HR 88 | Temp 98.2°F | Resp 20 | Ht 62.0 in | Wt 154.1 lb

## 2013-04-23 DIAGNOSIS — C50219 Malignant neoplasm of upper-inner quadrant of unspecified female breast: Secondary | ICD-10-CM

## 2013-04-23 DIAGNOSIS — C50212 Malignant neoplasm of upper-inner quadrant of left female breast: Secondary | ICD-10-CM

## 2013-04-23 DIAGNOSIS — E876 Hypokalemia: Secondary | ICD-10-CM

## 2013-04-23 LAB — COMPREHENSIVE METABOLIC PANEL (CC13)
ALT: 20 U/L (ref 0–55)
AST: 18 U/L (ref 5–34)
CO2: 28 mEq/L (ref 22–29)
Calcium: 10.1 mg/dL (ref 8.4–10.4)
Chloride: 100 mEq/L (ref 98–107)
Potassium: 3 mEq/L — CL (ref 3.5–5.1)
Sodium: 139 mEq/L (ref 136–145)
Total Protein: 6.4 g/dL (ref 6.4–8.3)

## 2013-04-23 LAB — CBC WITH DIFFERENTIAL/PLATELET
BASO%: 1.1 % (ref 0.0–2.0)
EOS%: 0 % (ref 0.0–7.0)
HCT: 24.7 % — ABNORMAL LOW (ref 34.8–46.6)
MCH: 27.4 pg (ref 25.1–34.0)
MCHC: 33.6 g/dL (ref 31.5–36.0)
MONO#: 0 10*3/uL — ABNORMAL LOW (ref 0.1–0.9)
NEUT%: 43.1 % (ref 38.4–76.8)
RBC: 3.03 10*6/uL — ABNORMAL LOW (ref 3.70–5.45)
RDW: 16.2 % — ABNORMAL HIGH (ref 11.2–14.5)
WBC: 1 10*3/uL — ABNORMAL LOW (ref 3.9–10.3)
lymph#: 0.5 10*3/uL — ABNORMAL LOW (ref 0.9–3.3)
nRBC: 0 % (ref 0–0)

## 2013-04-23 MED ORDER — POTASSIUM CHLORIDE CRYS ER 20 MEQ PO TBCR: 20.0000 meq | EXTENDED_RELEASE_TABLET | Freq: Two times a day (BID) | ORAL | Status: DC

## 2013-04-23 NOTE — Patient Instructions (Signed)
  Patient Neutropenia Instruction Sheet  Diagnosis: Breast Cancer      Treating Physician: Drue Second, MD  Treatment: 1. Type of chemotherapy: Adriamycin/Cytoxan 2. Date of last treatment: 04/16/13  Last Blood Counts: Lab Results  Component Value Date   WBC 1.0* 04/23/2013   HGB 8.3* 04/23/2013   HCT 24.7* 04/23/2013   MCV 81.5 04/23/2013   PLT 148 04/23/2013   ANC 400     Prophylactic Antibiotics: Cipro 500 mg by mouth twice a day Instructions: 1. Monitor temperature and call if fever  greater than 100.5, chills, shaking chills (rigors) 2. Call Physician on-call at [phone] 3. Give him/her symptoms and list of medications that you are taking and your last blood count.

## 2013-04-23 NOTE — Telephone Encounter (Signed)
LVMOM regarding lab results for 6/18. Per LA, K is low. LA advises to take K-40 mEq daily for 5 days. TMB

## 2013-04-23 NOTE — Progress Notes (Signed)
OFFICE PROGRESS NOTE  CC**  [name],[name], MD [address], [address] [place] [address]  DIAGNOSIS: 57 year old female with stage IIA ER weakly positive, PR negative HER-2/neu negative, invasive ductal carcinoma of the left breast.   PRIOR THERAPY: 1. Earlier this year it on routine screening mammography the patient was noted to have a abnormality within the 12:00 position of the left breast. Patient returned for additional imaging with 3-D, tomosynthesis. Within the left breast there were 2 masses somewhat lobulated with irregular margins in the upper inner aspect of the left breast. These were separated by approximately a centimeter. On ultrasound the lesion the 11:00 position measured 1.0 x 0.7 cm. The 12:00 lesion measured 1.6 x 0.9 cm. In addition within the axillary region there was a 2.1 x 1.7 suspicious appearing lymph node.. The patient proceeded to undergo ultrasound-guided biopsy of the 2 areas within the left breast as well as the suspicious left axillary lymph node. Biopsies from both breast areas revealed invasive ductal carcinoma. In addition the lymph node from the left axilla revealed ductal carcinoma. The lesions appeared to be high-grade. There was no HER-2/neu amplification and hormone staining was minimal consistent with essentially consistent with triple negative breast cancer.  Patient's case was discussed at the multidisciplinary breast conference her pathology and radiology were reviewed.  2. Patient underwent lumpectomy of the left breast with Dr. Donell Beers on 02/04/13, a 1.6cm tumor was removed along with an axillary lymph node dissection that found 1/19 nodes positive for metastatic disease.    3. Patient was then started on adjuvant chemotherapy on 03/05/13 with Adriamycin/Cytoxan.   CURRENT THERAPY: Adriamycin Cytoxan Cycle 4 day 8  INTERVAL HISTORY: Kristi [name] 57 y.o. female returns for evaluation  After her fourth cycle of Adriamycin/Cytoxan.  She is  very anxious and crying today.  She's been fatigued since her chemotherapy, and has been becoming "fixated" on her finger tenderness, and having a decreased appetite.  She saw her psychiatrist on Monday, who prescribed medication for her that she did not take.  She denies fevers, chills, nausea, vomiting, constipation, diarrhea, numbness, or any further concerns.  A 10 point ROS is otherwise neg.   MEDICAL HISTORY: Past Medical History  Diagnosis Date  . Anxiety   . GERD (gastroesophageal reflux disease)   . Hypertension   . Breast cancer   . MRSA (methicillin resistant Staphylococcus aureus) 2009    ALLERGIES:  has No Known Allergies.  MEDICATIONS:  Current Outpatient Prescriptions  Medication Sig Dispense Refill  . ALPRAZolam (XANAX) 1 MG tablet Take 2 mg by mouth at bedtime as needed for sleep.       Marland Kitchen amphetamine-dextroamphetamine (ADDERALL) 10 MG tablet Take 20 mg by mouth daily.       . B Complex-C (SUPER B COMPLEX PO) Take 1 tablet by mouth daily.      . ciprofloxacin (CIPRO) 500 MG tablet Take 1 tablet (500 mg total) by mouth 2 (two) times daily.  14 tablet  6  . dexamethasone (DECADRON) 4 MG tablet Take 2 tablets by mouth once a day on the day after chemotherapy and then take 2 tablets two times a day for 2 days. Take with food.  30 tablet  1  . escitalopram (LEXAPRO) 20 MG tablet Take 40 mg by mouth daily.       Marland Kitchen esomeprazole (NEXIUM) 40 MG capsule Take 40 mg by mouth daily as needed. For reflux      . lidocaine-prilocaine (EMLA) cream  Apply topically as needed.  30 g  7  . LORazepam (ATIVAN) 0.5 MG tablet Take 1 tablet (0.5 mg total) by mouth every 6 (six) hours as needed (Nausea or vomiting).  30 tablet  0  . Multiple Vitamin (MULTIVITAMIN) tablet Take 1 tablet by mouth daily.      . Omega-3 Fatty Acids (FISH OIL PO) Take 1 capsule by mouth daily.      . ondansetron (ZOFRAN) 8 MG tablet Take 1 tablet (8 mg total) by mouth 2 (two) times daily as needed. Take two times a day as  needed for nausea or vomiting starting on the third day after chemotherapy.  30 tablet  1  . OVER THE COUNTER MEDICATION Take by mouth as needed.      Marland Kitchen OVER THE COUNTER MEDICATION Take by mouth as needed. OTC stool softner, as needed      . potassium chloride SA (K-DUR,KLOR-CON) 20 MEQ tablet Take 1 tablet (20 mEq total) by mouth daily.  7 tablet  0  . prochlorperazine (COMPAZINE) 10 MG tablet Take 1 tablet (10 mg total) by mouth every 6 (six) hours as needed (Nausea or vomiting).  30 tablet  1  . prochlorperazine (COMPAZINE) 25 MG suppository Place 1 suppository (25 mg total) rectally every 12 (twelve) hours as needed for nausea.  12 suppository  3  . valsartan-hydrochlorothiazide (DIOVAN-HCT) 320-25 MG per tablet Take 1 tablet by mouth daily.        No current facility-administered medications for this visit.   Facility-Administered Medications Ordered in Other Visits  Medication Dose Route Frequency Provider Last Rate Last Dose  . sodium chloride 0.9 % injection 10 mL  10 mL Intracatheter PRN Victorino December, MD   10 mL at 04/03/13 1714    SURGICAL HISTORY:  Past Surgical History  Procedure Laterality Date  . Anal sphincterotomy  04/2011  . Hemorrhoid surgery  04/2011    ligation  . Breast lumpectomy with needle localization Left 02/04/2013    Procedure: LEFT BREAST LUMPECTOMY WITH NEEDLE LOCALIZATION;  Surgeon: Almond Lint, MD;  Location: [facility];  Service: General;  Laterality: Left;  . Axillary lymph node dissection Left 02/04/2013    Procedure: LEFT AXILLARY LYMPH NODE DISSECTION;  Surgeon: Almond Lint, MD;  Location: [facility] OR;  Service: General;  Laterality: Left;  End: 1512  . Portacath placement Right 02/04/2013    Procedure: INSERTION PORT-A-CATH;  Surgeon: Almond Lint, MD;  Location: [facility] OR;  Service: General;  Laterality: Right;  Start Time: 3086.  Marland Kitchen Appendectomy  1980    REVIEW OF SYSTEMS:   General: fatigue (-), night sweats (-), fever (-), pain (-) Lymph: palpable nodes  (-) HEENT: vision changes (-), mucositis (-), gum bleeding (-), epistaxis (-) Cardiovascular: chest pain (-), palpitations (-) Pulmonary: shortness of breath (-), dyspnea on exertion (-), cough (-), hemoptysis (-) GI:  Early satiety (-), melena (-), dysphagia (-), nausea/vomiting (-), diarrhea (-) GU: dysuria (-), hematuria (-), incontinence (-) Musculoskeletal: joint swelling (-), joint pain (-), back pain (-) Neuro: weakness (-), numbness (-), headache (-), confusion (-) Skin: Rash (-), lesions (-), dryness (-) Psych: depression (-), suicidal/homicidal ideation (-), feeling of hopelessness (-)   PHYSICAL EXAMINATION: Blood pressure 95/58, pulse 88, temperature 98.2 F (36.8 C), temperature source Oral, resp. rate 20, height 5\' 2"  (1.575 m), weight 154 lb 1.6 oz (69.899 kg). Body mass index is 28.18 kg/(m^2). General: Patient is a well appearing female in no acute distress HEENT: PERRLA, sclerae anicteric no conjunctival  pallor, MMM Neck: supple, no palpable adenopathy Lungs: clear to auscultation bilaterally, no wheezes, rhonchi, or rales Cardiovascular: regular rate rhythm, S1, S2, no murmurs, rubs or gallops Abdomen: Soft, non-tender, non-distended, normoactive bowel sounds, no HSM Extremities: warm and well perfused, no clubbing, cyanosis, or edema Skin: No rashes or lesions Neuro: Non-focal Breasts: left breast lumpectomy site no nodularity, healing well, scar tissue underneath axillary incision, no erythema, swelling, drainage, right breast no masses or nodularity.  ECOG PERFORMANCE STATUS: 1 - Symptomatic but completely ambulatory  LABORATORY DATA: Lab Results  Component Value Date   WBC 1.0* 04/23/2013   HGB 8.3* 04/23/2013   HCT 24.7* 04/23/2013   MCV 81.5 04/23/2013   PLT 148 04/23/2013      Chemistry      Component Value Date/Time   NA 139 04/17/2013 1047   NA 137 02/05/2013 0505   K 3.1* 04/17/2013 1047   K 3.4* 02/05/2013 0505   CL 102 04/17/2013 1047   CL 102 02/05/2013  0505   CO2 28 04/17/2013 1047   CO2 27 02/05/2013 0505   BUN 9.3 04/17/2013 1047   BUN 8 02/05/2013 0505   CREATININE 0.8 04/17/2013 1047   CREATININE 0.86 02/05/2013 0505      Component Value Date/Time   CALCIUM 9.6 04/17/2013 1047   CALCIUM 8.0* 02/05/2013 0505   ALKPHOS 81 04/17/2013 1047   AST 17 04/17/2013 1047   ALT 18 04/17/2013 1047   BILITOT 0.20 04/17/2013 1047       RADIOGRAPHIC STUDIES:  No results found.  ASSESSMENT: 57 year old female with new diagnosis of   #1 multifocal invasive ductal carcinoma grade 3 ER weakly positive at 3% PR negative HER-2/neu negative with Ki-67 of 90% tumor is very aggressive. She does have a palpable lymph node as well in the left axilla. Patient was seen in the multidisciplinary clinic for discussion of treatment options.   #2 patient underwent a lumpectomy with axillary lymph node dissection with Dr. Luz Brazen on 02/04/13. A 1.6 cm tumor was removed with 1/19 lymph nodes positive for disease.    #3 She was recommended adjuvant chemotherapy consisting of Adriamycin Cytoxan given dose dense for total of 4 cycles followed by Taxol carboplatinum weekly for 12 weeks. Rationale for this aggressive treatment was discussed with the patient since she is essentially has a triple-negative disease.   #4 certainly patient will need post lumpectomy radiation therapy she was seen by radiation oncology. This will begin after patient has completed her chemotherapy.   #5 we discussed role of genetics, and she had an appointment with Maylon Cos on 03/06/13 the results were negative.       PLAN:  1. Doing well. She is neutropenic following her fourth cycle of adriamycin/cytoxan.  She will have her cipro refilled and I reviewed neutropenic precautions with her in detail.   2. We will see Ms. Que back in two weeks to begin Taxol/Carbo.  3. We discussed her anxiety and reduction measures and she will continue to f/u with her psychologist as well.    All questions were  answered. The patient knows to call the clinic with any problems, questions or concerns. We can certainly see the patient much sooner if necessary.  I spent 25 minutes counseling the patient face to face. The total time spent in the appointment was 30 minutes.  Cherie Ouch Lyn Hollingshead, NP Medical Oncology [facility] Phone: [phone] 04/23/2013, 3:12 PM

## 2013-04-23 NOTE — Telephone Encounter (Signed)
Patient called reporting she noticed a call from [facility] but no message was left.  Informed her of the low potassium level on 04-23-2013.  Instructions on how to take potassium given.  Order sent eRx to [facility] in [place].

## 2013-04-24 ENCOUNTER — Telehealth: Payer: Self-pay

## 2013-04-24 NOTE — Telephone Encounter (Signed)
LMOVM regarding lab results for 6/18. Second attempt to reach pt. Awaiting response. TMB

## 2013-05-01 ENCOUNTER — Other Ambulatory Visit: Payer: BC Managed Care – PPO | Admitting: Lab

## 2013-05-01 ENCOUNTER — Ambulatory Visit: Payer: BC Managed Care – PPO | Admitting: Oncology

## 2013-05-07 ENCOUNTER — Encounter: Payer: Self-pay | Admitting: Oncology

## 2013-05-07 ENCOUNTER — Ambulatory Visit (HOSPITAL_BASED_OUTPATIENT_CLINIC_OR_DEPARTMENT_OTHER): Payer: BC Managed Care – PPO

## 2013-05-07 ENCOUNTER — Ambulatory Visit (HOSPITAL_BASED_OUTPATIENT_CLINIC_OR_DEPARTMENT_OTHER): Payer: BC Managed Care – PPO | Admitting: Adult Health

## 2013-05-07 ENCOUNTER — Other Ambulatory Visit (HOSPITAL_BASED_OUTPATIENT_CLINIC_OR_DEPARTMENT_OTHER): Payer: BC Managed Care – PPO | Admitting: Lab

## 2013-05-07 VITALS — BP 109/63 | HR 67 | Temp 98.0°F | Resp 18

## 2013-05-07 VITALS — BP 116/68 | HR 99 | Temp 98.0°F | Resp 20 | Ht 62.0 in | Wt 154.4 lb

## 2013-05-07 DIAGNOSIS — C50919 Malignant neoplasm of unspecified site of unspecified female breast: Secondary | ICD-10-CM

## 2013-05-07 DIAGNOSIS — C773 Secondary and unspecified malignant neoplasm of axilla and upper limb lymph nodes: Secondary | ICD-10-CM

## 2013-05-07 DIAGNOSIS — C50219 Malignant neoplasm of upper-inner quadrant of unspecified female breast: Secondary | ICD-10-CM

## 2013-05-07 DIAGNOSIS — Z5111 Encounter for antineoplastic chemotherapy: Secondary | ICD-10-CM

## 2013-05-07 DIAGNOSIS — C50212 Malignant neoplasm of upper-inner quadrant of left female breast: Secondary | ICD-10-CM

## 2013-05-07 DIAGNOSIS — R110 Nausea: Secondary | ICD-10-CM

## 2013-05-07 LAB — COMPREHENSIVE METABOLIC PANEL (CC13)
ALT: 24 U/L (ref 0–55)
AST: 24 U/L (ref 5–34)
CO2: 30 mEq/L — ABNORMAL HIGH (ref 22–29)
Chloride: 103 mEq/L (ref 98–109)
Creatinine: 0.7 mg/dL (ref 0.6–1.1)
Sodium: 141 mEq/L (ref 136–145)
Total Bilirubin: <0.2 mg/dL (ref 0.20–1.20)
Total Protein: 6.3 g/dL — ABNORMAL LOW (ref 6.4–8.3)

## 2013-05-07 LAB — CBC WITH DIFFERENTIAL/PLATELET
BASO%: 0.3 % (ref 0.0–2.0)
EOS%: 0.2 % (ref 0.0–7.0)
LYMPH%: 18 % (ref 14.0–49.7)
MCH: 27.6 pg (ref 25.1–34.0)
MCHC: 32.7 g/dL (ref 31.5–36.0)
MONO#: 1.1 10*3/uL — ABNORMAL HIGH (ref 0.1–0.9)
RBC: 3.08 10*6/uL — ABNORMAL LOW (ref 3.70–5.45)
WBC: 5.9 10*3/uL (ref 3.9–10.3)
lymph#: 1.1 10*3/uL (ref 0.9–3.3)

## 2013-05-07 MED ORDER — PROCHLORPERAZINE 25 MG RE SUPP: 25.0000 mg | Freq: Two times a day (BID) | RECTAL | Status: DC | PRN

## 2013-05-07 MED ORDER — ONDANSETRON HCL 8 MG PO TABS: 8.0000 mg | ORAL_TABLET | Freq: Two times a day (BID) | ORAL | Status: DC

## 2013-05-07 MED ORDER — LORAZEPAM 0.5 MG PO TABS: 0.5000 mg | ORAL_TABLET | Freq: Four times a day (QID) | ORAL | Status: DC | PRN

## 2013-05-07 MED ORDER — DEXAMETHASONE SODIUM PHOSPHATE 20 MG/5ML IJ SOLN
20.0000 mg | Freq: Once | INTRAMUSCULAR | Status: AC
  Administered 2013-05-07: 20 mg via INTRAVENOUS

## 2013-05-07 MED ORDER — DIPHENHYDRAMINE HCL 50 MG/ML IJ SOLN
50.0000 mg | Freq: Once | INTRAMUSCULAR | Status: AC
  Administered 2013-05-07: 50 mg via INTRAVENOUS

## 2013-05-07 MED ORDER — SODIUM CHLORIDE 0.9 % IJ SOLN
10.0000 mL | INTRAMUSCULAR | Status: DC | PRN
  Administered 2013-05-07: 10 mL via INTRAVENOUS
  Filled 2013-05-07: qty 10

## 2013-05-07 MED ORDER — PACLITAXEL CHEMO INJECTION 300 MG/50ML
80.0000 mg/m2 | Freq: Once | INTRAVENOUS | Status: AC
  Administered 2013-05-07: 138 mg via INTRAVENOUS
  Filled 2013-05-07: qty 23

## 2013-05-07 MED ORDER — SODIUM CHLORIDE 0.9 % IV SOLN
243.4000 mg | Freq: Once | INTRAVENOUS | Status: AC
  Administered 2013-05-07: 240 mg via INTRAVENOUS
  Filled 2013-05-07: qty 24

## 2013-05-07 MED ORDER — FAMOTIDINE IN NACL 20-0.9 MG/50ML-% IV SOLN
20.0000 mg | Freq: Once | INTRAVENOUS | Status: AC
  Administered 2013-05-07: 20 mg via INTRAVENOUS

## 2013-05-07 MED ORDER — HEPARIN SOD (PORK) LOCK FLUSH 100 UNIT/ML IV SOLN
500.0000 [IU] | Freq: Once | INTRAVENOUS | Status: AC
  Administered 2013-05-07: 500 [IU] via INTRAVENOUS
  Filled 2013-05-07: qty 5

## 2013-05-07 MED ORDER — ONDANSETRON 16 MG/50ML IVPB (CHCC)
16.0000 mg | Freq: Once | INTRAVENOUS | Status: AC
  Administered 2013-05-07: 16 mg via INTRAVENOUS

## 2013-05-07 MED ORDER — PROCHLORPERAZINE MALEATE 10 MG PO TABS: 10.0000 mg | ORAL_TABLET | Freq: Four times a day (QID) | ORAL | Status: DC | PRN

## 2013-05-07 MED ORDER — DEXAMETHASONE 4 MG PO TABS: 8.0000 mg | ORAL_TABLET | Freq: Two times a day (BID) | ORAL | Status: DC

## 2013-05-07 NOTE — Patient Instructions (Addendum)
[facility] Discharge Instructions for Patients Receiving Chemotherapy  Today you received the following chemotherapy agents: Taxol, Carboplatin.  To help prevent nausea and vomiting after your treatment, we encourage you to take your nausea medication.   If you develop nausea and vomiting that is not controlled by your nausea medication, call the clinic.   BELOW ARE SYMPTOMS THAT SHOULD BE REPORTED IMMEDIATELY:  *FEVER GREATER THAN 100.5 F  *CHILLS WITH OR WITHOUT FEVER  NAUSEA AND VOMITING THAT IS NOT CONTROLLED WITH YOUR NAUSEA MEDICATION  *UNUSUAL SHORTNESS OF BREATH  *UNUSUAL BRUISING OR BLEEDING  TENDERNESS IN MOUTH AND THROAT WITH OR WITHOUT PRESENCE OF ULCERS  *URINARY PROBLEMS  *BOWEL PROBLEMS  UNUSUAL RASH Items with * indicate a potential emergency and should be followed up as soon as possible.  Feel free to call the clinic you have any questions or concerns. The clinic phone number is [phone].    

## 2013-05-07 NOTE — Patient Instructions (Addendum)
Doing well.  Proceed with chemotherapy.  We will see you back in one week.  Please call us if you have any questions or concerns.

## 2013-05-08 NOTE — Progress Notes (Addendum)
OFFICE PROGRESS NOTE  CC**  [name],[name], MD [address] [place] [address]  DIAGNOSIS: 57 year old female with stage IIA ER weakly positive, PR negative HER-2/neu negative, invasive ductal carcinoma of the left breast.   PRIOR THERAPY: 1. Earlier this year it on routine screening mammography the patient was noted to have a abnormality within the 12:00 position of the left breast. Patient returned for additional imaging with 3-D, tomosynthesis. Within the left breast there were 2 masses somewhat lobulated with irregular margins in the upper inner aspect of the left breast. These were separated by approximately a centimeter. On ultrasound the lesion the 11:00 position measured 1.0 x 0.7 cm. The 12:00 lesion measured 1.6 x 0.9 cm. In addition within the axillary region there was a 2.1 x 1.7 suspicious appearing lymph node.. The patient proceeded to undergo ultrasound-guided biopsy of the 2 areas within the left breast as well as the suspicious left axillary lymph node. Biopsies from both breast areas revealed invasive ductal carcinoma. In addition the lymph node from the left axilla revealed ductal carcinoma. The lesions appeared to be high-grade. There was no HER-2/neu amplification and hormone staining was minimal consistent with essentially consistent with triple negative breast cancer.  Patient's case was discussed at the multidisciplinary breast conference her pathology and radiology were reviewed.  2. Patient underwent lumpectomy of the left breast with Dr. Donell Beers on 02/04/13, a 1.6cm tumor was removed along with an axillary lymph node dissection that found 1/19 nodes positive for metastatic disease.    3. Patient was then started on adjuvant chemotherapy on 03/05/13 with Adriamycin/Cytoxan. She began Taxol Carbo on 05/07/13  CURRENT THERAPY: Taxol Carbo week 1  INTERVAL HISTORY: Kristi [name] 57 y.o. female returns for evaluation prior to Taxol Carbo.  She is doing well  today.  She enjoyed the break she got following the Adriamyin/Cytoxan.  She denies fevers, chills, nausea, vomiting, constipation, diarrhea, numbness, skin changes, or any further concerns.  A 10 point ROS is negative.   MEDICAL HISTORY: Past Medical History  Diagnosis Date  . Anxiety   . GERD (gastroesophageal reflux disease)   . Hypertension   . Breast cancer   . MRSA (methicillin resistant Staphylococcus aureus) 2009    ALLERGIES:  has No Known Allergies.  MEDICATIONS:  Current Outpatient Prescriptions  Medication Sig Dispense Refill  . ALPRAZolam (XANAX) 1 MG tablet Take 2 mg by mouth at bedtime as needed for sleep.       Marland Kitchen amphetamine-dextroamphetamine (ADDERALL) 10 MG tablet Take 20 mg by mouth daily.       . B Complex-C (SUPER B COMPLEX PO) Take 1 tablet by mouth daily.      . ciprofloxacin (CIPRO) 500 MG tablet Take 1 tablet (500 mg total) by mouth 2 (two) times daily.  14 tablet  6  . COMPRO 25 MG suppository       . dexamethasone (DECADRON) 4 MG tablet       . escitalopram (LEXAPRO) 20 MG tablet Take 40 mg by mouth daily.       Marland Kitchen esomeprazole (NEXIUM) 40 MG capsule Take 40 mg by mouth daily as needed. For reflux      . lidocaine-prilocaine (EMLA) cream Apply topically as needed.  30 g  7  . LORazepam (ATIVAN) 0.5 MG tablet Take 1 tablet (0.5 mg total) by mouth every 6 (six) hours as needed for anxiety.  30 tablet  0  . Multiple Vitamin (MULTIVITAMIN) tablet Take 1 tablet  by mouth daily.      . Omega-3 Fatty Acids (FISH OIL PO) Take 1 capsule by mouth daily.      . ondansetron (ZOFRAN) 8 MG tablet       . OVER THE COUNTER MEDICATION Take by mouth as needed.      Marland Kitchen OVER THE COUNTER MEDICATION Take by mouth as needed. OTC stool softner, as needed      . potassium chloride SA (K-DUR,KLOR-CON) 20 MEQ tablet Take 1 tablet (20 mEq total) by mouth 2 (two) times daily.  10 tablet  0  . prochlorperazine (COMPAZINE) 10 MG tablet       . valsartan-hydrochlorothiazide (DIOVAN-HCT)  320-25 MG per tablet Take 1 tablet by mouth daily.       Marland Kitchen dexamethasone (DECADRON) 4 MG tablet Take 2 tablets (8 mg total) by mouth 2 (two) times daily with a meal. Take two times a day starting the day after chemotherapy for 3 days.  30 tablet  1  . LORazepam (ATIVAN) 0.5 MG tablet Take 1 tablet (0.5 mg total) by mouth every 6 (six) hours as needed (Nausea or vomiting).  30 tablet  0  . ondansetron (ZOFRAN) 8 MG tablet Take 1 tablet (8 mg total) by mouth 2 (two) times daily. Take two times a day starting the day after chemo for 3 days. Then take two times a day as needed for nausea or vomiting.  30 tablet  1  . prochlorperazine (COMPAZINE) 10 MG tablet Take 1 tablet (10 mg total) by mouth every 6 (six) hours as needed (Nausea or vomiting).  30 tablet  1  . prochlorperazine (COMPAZINE) 25 MG suppository Place 1 suppository (25 mg total) rectally every 12 (twelve) hours as needed for nausea.  12 suppository  3   No current facility-administered medications for this visit.    SURGICAL HISTORY:  Past Surgical History  Procedure Laterality Date  . Anal sphincterotomy  04/2011  . Hemorrhoid surgery  04/2011    ligation  . Breast lumpectomy with needle localization Left 02/04/2013    Procedure: LEFT BREAST LUMPECTOMY WITH NEEDLE LOCALIZATION;  Surgeon: Almond Lint, MD;  Location: [facility] OR;  Service: General;  Laterality: Left;  . Axillary lymph node dissection Left 02/04/2013    Procedure: LEFT AXILLARY LYMPH NODE DISSECTION;  Surgeon: Almond Lint, MD;  Location: [facility] OR;  Service: General;  Laterality: Left;  End: 1512  . Portacath placement Right 02/04/2013    Procedure: INSERTION PORT-A-CATH;  Surgeon: Almond Lint, MD;  Location: [facility] OR;  Service: General;  Laterality: Right;  Start Time: 1610.  Marland Kitchen Appendectomy  1980    REVIEW OF SYSTEMS:   General: fatigue (-), night sweats (-), fever (-), pain (-) Lymph: palpable nodes (-) HEENT: vision changes (-), mucositis (-), gum bleeding (-), epistaxis  (-) Cardiovascular: chest pain (-), palpitations (-) Pulmonary: shortness of breath (-), dyspnea on exertion (-), cough (-), hemoptysis (-) GI:  Early satiety (-), melena (-), dysphagia (-), nausea/vomiting (-), diarrhea (-) GU: dysuria (-), hematuria (-), incontinence (-) Musculoskeletal: joint swelling (-), joint pain (-), back pain (-) Neuro: weakness (-), numbness (-), headache (-), confusion (-) Skin: Rash (-), lesions (-), dryness (-) Psych: depression (-), suicidal/homicidal ideation (-), feeling of hopelessness (-)   PHYSICAL EXAMINATION: Blood pressure 116/68, pulse 99, temperature 98 F (36.7 C), temperature source Oral, resp. rate 20, height 5\' 2"  (1.575 m), weight 154 lb 6.4 oz (70.035 kg). Body mass index is 28.23 kg/(m^2). General: Patient is a well appearing  female in no acute distress HEENT: PERRLA, sclerae anicteric no conjunctival pallor, MMM Neck: supple, no palpable adenopathy Lungs: clear to auscultation bilaterally, no wheezes, rhonchi, or rales Cardiovascular: regular rate rhythm, S1, S2, no murmurs, rubs or gallops Abdomen: Soft, non-tender, non-distended, normoactive bowel sounds, no HSM Extremities: warm and well perfused, no clubbing, cyanosis, or edema Skin: No rashes or lesions Neuro: Non-focal Breasts: left breast lumpectomy site no nodularity, healing well, scar tissue underneath axillary incision, no erythema, swelling, drainage, right breast no masses or nodularity.  ECOG PERFORMANCE STATUS: 1 - Symptomatic but completely ambulatory  LABORATORY DATA: Lab Results  Component Value Date   WBC 5.9 05/07/2013   HGB 8.5* 05/07/2013   HCT 26.0* 05/07/2013   MCV 84.4 05/07/2013   PLT 310 05/07/2013      Chemistry      Component Value Date/Time   NA 141 05/07/2013 1247   NA 137 02/05/2013 0505   K 3.2* 05/07/2013 1247   K 3.4* 02/05/2013 0505   CL 100 04/23/2013 1447   CL 102 02/05/2013 0505   CO2 30* 05/07/2013 1247   CO2 27 02/05/2013 0505   BUN 9.2 05/07/2013 1247    BUN 8 02/05/2013 0505   CREATININE 0.7 05/07/2013 1247   CREATININE 0.86 02/05/2013 0505      Component Value Date/Time   CALCIUM 9.6 05/07/2013 1247   CALCIUM 8.0* 02/05/2013 0505   ALKPHOS 66 05/07/2013 1247   AST 24 05/07/2013 1247   ALT 24 05/07/2013 1247   BILITOT <0.20 Repeated and Verified 05/07/2013 1247       RADIOGRAPHIC STUDIES:  No results found.  ASSESSMENT: 57 year old female with new diagnosis of   #1 multifocal invasive ductal carcinoma grade 3 ER weakly positive at 3% PR negative HER-2/neu negative with Ki-67 of 90% tumor is very aggressive. She does have a palpable lymph node as well in the left axilla. Patient was seen in the multidisciplinary clinic for discussion of treatment options.   #2 patient underwent a lumpectomy with axillary lymph node dissection with Dr. Luz Brazen on 02/04/13. A 1.6 cm tumor was removed with 1/19 lymph nodes positive for disease.    #3 She was recommended adjuvant chemotherapy consisting of Adriamycin Cytoxan given dose dense for total of 4 cycles followed by Taxol carboplatinum weekly for 12 weeks. Rationale for this aggressive treatment was discussed with the patient since she is essentially has a triple-negative disease.   #4 certainly patient will need post lumpectomy radiation therapy she was seen by radiation oncology. This will begin after patient has completed her chemotherapy.   #5 we discussed role of genetics, and she had an appointment with Maylon Cos on 03/06/13 the results were negative.       PLAN:  1. Doing well. We discussed her new chemotherapy regimen in detail.  She will proceed with this today.    2. We will see Ms. Cronce back in one week for cycle 2 Taxol/Carbo.  All questions were answered. The patient knows to call the clinic with any problems, questions or concerns. We can certainly see the patient much sooner if necessary.  I spent 25 minutes counseling the patient face to face. The total time spent in the appointment was 30  minutes.  Cherie Ouch Lyn Hollingshead, NP Medical Oncology [facility] Phone: [phone] 05/08/2013, 8:17 AM

## 2013-05-13 ENCOUNTER — Telehealth: Payer: Self-pay | Admitting: *Deleted

## 2013-05-13 ENCOUNTER — Other Ambulatory Visit: Payer: Self-pay | Admitting: Adult Health

## 2013-05-13 ENCOUNTER — Telehealth: Payer: Self-pay | Admitting: Oncology

## 2013-05-13 NOTE — Telephone Encounter (Signed)
, °

## 2013-05-13 NOTE — Telephone Encounter (Signed)
Per staff message and POF I have moved appts from Wednesdays to Thursdays. JMW

## 2013-05-14 ENCOUNTER — Ambulatory Visit: Payer: BC Managed Care – PPO | Admitting: Adult Health

## 2013-05-14 ENCOUNTER — Ambulatory Visit: Payer: BC Managed Care – PPO

## 2013-05-14 ENCOUNTER — Other Ambulatory Visit: Payer: BC Managed Care – PPO | Admitting: Lab

## 2013-05-15 ENCOUNTER — Ambulatory Visit: Payer: BC Managed Care – PPO | Admitting: Oncology

## 2013-05-15 ENCOUNTER — Other Ambulatory Visit (HOSPITAL_BASED_OUTPATIENT_CLINIC_OR_DEPARTMENT_OTHER): Payer: BC Managed Care – PPO | Admitting: Lab

## 2013-05-15 ENCOUNTER — Encounter: Payer: Self-pay | Admitting: Oncology

## 2013-05-15 ENCOUNTER — Ambulatory Visit (HOSPITAL_BASED_OUTPATIENT_CLINIC_OR_DEPARTMENT_OTHER): Payer: BC Managed Care – PPO | Admitting: Adult Health

## 2013-05-15 ENCOUNTER — Encounter: Payer: Self-pay | Admitting: Adult Health

## 2013-05-15 ENCOUNTER — Ambulatory Visit (HOSPITAL_BASED_OUTPATIENT_CLINIC_OR_DEPARTMENT_OTHER): Payer: BC Managed Care – PPO

## 2013-05-15 ENCOUNTER — Other Ambulatory Visit: Payer: BC Managed Care – PPO | Admitting: Lab

## 2013-05-15 VITALS — BP 124/73 | HR 92 | Temp 98.1°F | Resp 20 | Ht 62.0 in | Wt 152.3 lb

## 2013-05-15 DIAGNOSIS — Z5111 Encounter for antineoplastic chemotherapy: Secondary | ICD-10-CM

## 2013-05-15 DIAGNOSIS — C50212 Malignant neoplasm of upper-inner quadrant of left female breast: Secondary | ICD-10-CM

## 2013-05-15 DIAGNOSIS — C50919 Malignant neoplasm of unspecified site of unspecified female breast: Secondary | ICD-10-CM

## 2013-05-15 DIAGNOSIS — Z171 Estrogen receptor negative status [ER-]: Secondary | ICD-10-CM

## 2013-05-15 DIAGNOSIS — C773 Secondary and unspecified malignant neoplasm of axilla and upper limb lymph nodes: Secondary | ICD-10-CM

## 2013-05-15 LAB — CBC WITH DIFFERENTIAL/PLATELET
BASO%: 0.2 % (ref 0.0–2.0)
Basophils Absolute: 0 10*3/uL (ref 0.0–0.1)
EOS%: 0.7 % (ref 0.0–7.0)
HGB: 8.8 g/dL — ABNORMAL LOW (ref 11.6–15.9)
MCH: 27.8 pg (ref 25.1–34.0)
MONO%: 10.3 % (ref 0.0–14.0)
RBC: 3.16 10*6/uL — ABNORMAL LOW (ref 3.70–5.45)
RDW: 18.9 % — ABNORMAL HIGH (ref 11.2–14.5)
lymph#: 1.3 10*3/uL (ref 0.9–3.3)
nRBC: 0 % (ref 0–0)

## 2013-05-15 LAB — COMPREHENSIVE METABOLIC PANEL (CC13)
ALT: 30 U/L (ref 0–55)
AST: 26 U/L (ref 5–34)
Albumin: 3.6 g/dL (ref 3.5–5.0)
Alkaline Phosphatase: 53 U/L (ref 40–150)
Potassium: 3.1 mEq/L — ABNORMAL LOW (ref 3.5–5.1)
Sodium: 142 mEq/L (ref 136–145)
Total Bilirubin: 0.33 mg/dL (ref 0.20–1.20)
Total Protein: 6.4 g/dL (ref 6.4–8.3)

## 2013-05-15 MED ORDER — FAMOTIDINE IN NACL 20-0.9 MG/50ML-% IV SOLN: 20.0000 mg | Freq: Once | INTRAVENOUS | Status: DC

## 2013-05-15 MED ORDER — SODIUM CHLORIDE 0.9 % IJ SOLN
10.0000 mL | INTRAMUSCULAR | Status: DC | PRN
  Administered 2013-05-15: 10 mL
  Filled 2013-05-15: qty 10

## 2013-05-15 MED ORDER — DIPHENHYDRAMINE HCL 50 MG/ML IJ SOLN
50.0000 mg | Freq: Once | INTRAMUSCULAR | Status: AC
  Administered 2013-05-15: 50 mg via INTRAVENOUS

## 2013-05-15 MED ORDER — ONDANSETRON 16 MG/50ML IVPB (CHCC)
16.0000 mg | Freq: Once | INTRAVENOUS | Status: AC
  Administered 2013-05-15: 16 mg via INTRAVENOUS

## 2013-05-15 MED ORDER — SODIUM CHLORIDE 0.9 % IV SOLN
243.4000 mg | Freq: Once | INTRAVENOUS | Status: AC
  Administered 2013-05-15: 240 mg via INTRAVENOUS
  Filled 2013-05-15: qty 24

## 2013-05-15 MED ORDER — DEXAMETHASONE SODIUM PHOSPHATE 20 MG/5ML IJ SOLN
20.0000 mg | Freq: Once | INTRAMUSCULAR | Status: AC
  Administered 2013-05-15: 20 mg via INTRAVENOUS

## 2013-05-15 MED ORDER — RANITIDINE HCL 50 MG/2ML IJ SOLN
50.0000 mg | Freq: Once | INTRAVENOUS | Status: AC
  Administered 2013-05-15: 50 mg via INTRAVENOUS
  Filled 2013-05-15: qty 2

## 2013-05-15 MED ORDER — SODIUM CHLORIDE 0.9 % IV SOLN
80.0000 mg/m2 | Freq: Once | INTRAVENOUS | Status: AC
  Administered 2013-05-15: 138 mg via INTRAVENOUS
  Filled 2013-05-15: qty 23

## 2013-05-15 MED ORDER — HEPARIN SOD (PORK) LOCK FLUSH 100 UNIT/ML IV SOLN
500.0000 [IU] | Freq: Once | INTRAVENOUS | Status: AC | PRN
  Administered 2013-05-15: 500 [IU]
  Filled 2013-05-15: qty 5

## 2013-05-15 MED ORDER — SODIUM CHLORIDE 0.9 % IV SOLN
Freq: Once | INTRAVENOUS | Status: AC
  Administered 2013-05-15: 15:00:00 via INTRAVENOUS

## 2013-05-15 NOTE — Progress Notes (Signed)
OFFICE PROGRESS NOTE  CC**  [name],[name], MD [address] [place] [address]  DIAGNOSIS: 57 year old female with stage IIA ER weakly positive, PR negative HER-2/neu negative, invasive ductal carcinoma of the left breast.   PRIOR THERAPY: 1. Earlier this year it on routine screening mammography the patient was noted to have a abnormality within the 12:00 position of the left breast. Patient returned for additional imaging with 3-D, tomosynthesis. Within the left breast there were 2 masses somewhat lobulated with irregular margins in the upper inner aspect of the left breast. These were separated by approximately a centimeter. On ultrasound the lesion the 11:00 position measured 1.0 x 0.7 cm. The 12:00 lesion measured 1.6 x 0.9 cm. In addition within the axillary region there was a 2.1 x 1.7 suspicious appearing lymph node.. The patient proceeded to undergo ultrasound-guided biopsy of the 2 areas within the left breast as well as the suspicious left axillary lymph node. Biopsies from both breast areas revealed invasive ductal carcinoma. In addition the lymph node from the left axilla revealed ductal carcinoma. The lesions appeared to be high-grade. There was no HER-2/neu amplification and hormone staining was minimal consistent with essentially consistent with triple negative breast cancer.  Patient's case was discussed at the multidisciplinary breast conference her pathology and radiology were reviewed.  2. Patient underwent lumpectomy of the left breast with Dr. Donell Beers on 02/04/13, a 1.6cm tumor was removed along with an axillary lymph node dissection that found 1/19 nodes positive for metastatic disease.    3. Patient was then started on adjuvant chemotherapy on 03/05/13 with Adriamycin/Cytoxan. She began Taxol Carbo on 05/07/13  CURRENT THERAPY: Taxol Carbo week 2  INTERVAL HISTORY: Kristi [name] 57 y.o. female returns for evaluation prior to Taxol Carbo.  She is doing well  today.  She continues to monitor for the adverse effects we received last week and has not had any.  She denies fevers, chills, nausea, vomiting, constipation, diarrhea, numbness, skin changes, pain, or any further concerns.  She is fatigued.  Otherwise, a 10 point ROS is neg.   MEDICAL HISTORY: Past Medical History  Diagnosis Date  . Anxiety   . GERD (gastroesophageal reflux disease)   . Hypertension   . Breast cancer   . MRSA (methicillin resistant Staphylococcus aureus) 2009    ALLERGIES:  has No Known Allergies.  MEDICATIONS:  Current Outpatient Prescriptions  Medication Sig Dispense Refill  . ALPRAZolam (XANAX) 1 MG tablet Take 2 mg by mouth at bedtime as needed for sleep.       Marland Kitchen amphetamine-dextroamphetamine (ADDERALL) 10 MG tablet Take 20 mg by mouth daily.       . B Complex-C (SUPER B COMPLEX PO) Take 1 tablet by mouth daily.      . ciprofloxacin (CIPRO) 500 MG tablet Take 1 tablet (500 mg total) by mouth 2 (two) times daily.  14 tablet  6  . COMPRO 25 MG suppository       . dexamethasone (DECADRON) 4 MG tablet       . dexamethasone (DECADRON) 4 MG tablet Take 2 tablets (8 mg total) by mouth 2 (two) times daily with a meal. Take two times a day starting the day after chemotherapy for 3 days.  30 tablet  1  . escitalopram (LEXAPRO) 20 MG tablet Take 40 mg by mouth daily.       Marland Kitchen esomeprazole (NEXIUM) 40 MG capsule Take 40 mg by mouth daily as needed. For reflux      .  lidocaine-prilocaine (EMLA) cream Apply topically as needed.  30 g  7  . LORazepam (ATIVAN) 0.5 MG tablet Take 1 tablet (0.5 mg total) by mouth every 6 (six) hours as needed for anxiety.  30 tablet  0  . LORazepam (ATIVAN) 0.5 MG tablet Take 1 tablet (0.5 mg total) by mouth every 6 (six) hours as needed (Nausea or vomiting).  30 tablet  0  . Multiple Vitamin (MULTIVITAMIN) tablet Take 1 tablet by mouth daily.      . Omega-3 Fatty Acids (FISH OIL PO) Take 1 capsule by mouth daily.      . ondansetron (ZOFRAN) 8 MG  tablet       . ondansetron (ZOFRAN) 8 MG tablet Take 1 tablet (8 mg total) by mouth 2 (two) times daily. Take two times a day starting the day after chemo for 3 days. Then take two times a day as needed for nausea or vomiting.  30 tablet  1  . OVER THE COUNTER MEDICATION Take by mouth as needed.      Marland Kitchen OVER THE COUNTER MEDICATION Take by mouth as needed. OTC stool softner, as needed      . potassium chloride SA (K-DUR,KLOR-CON) 20 MEQ tablet Take 1 tablet (20 mEq total) by mouth 2 (two) times daily.  10 tablet  0  . prochlorperazine (COMPAZINE) 10 MG tablet       . prochlorperazine (COMPAZINE) 10 MG tablet Take 1 tablet (10 mg total) by mouth every 6 (six) hours as needed (Nausea or vomiting).  30 tablet  1  . prochlorperazine (COMPAZINE) 25 MG suppository Place 1 suppository (25 mg total) rectally every 12 (twelve) hours as needed for nausea.  12 suppository  3  . valsartan-hydrochlorothiazide (DIOVAN-HCT) 320-25 MG per tablet Take 1 tablet by mouth daily.        No current facility-administered medications for this visit.   Facility-Administered Medications Ordered in Other Visits  Medication Dose Route Frequency Provider Last Rate Last Dose  . CARBOplatin (PARAPLATIN) 240 mg in sodium chloride 0.9 % 100 mL chemo infusion  240 mg Intravenous Once Augustin Schooling, NP      . famotidine (PEPCID) IVPB 20 mg  20 mg Intravenous Once Augustin Schooling, NP      . heparin lock flush 100 unit/mL  500 Units Intracatheter Once PRN Augustin Schooling, NP      . PACLitaxel (TAXOL) 138 mg in sodium chloride 0.9 % 250 mL chemo infusion (</= 80mg /m2)  80 mg/m2 (Treatment Plan Actual) Intravenous Once Augustin Schooling, NP 273 mL/hr at 05/15/13 1559 138 mg at 05/15/13 1559  . sodium chloride 0.9 % injection 10 mL  10 mL Intracatheter PRN Augustin Schooling, NP        SURGICAL HISTORY:  Past Surgical History  Procedure Laterality Date  . Anal sphincterotomy  04/2011  . Hemorrhoid surgery  04/2011    ligation   . Breast lumpectomy with needle localization Left 02/04/2013    Procedure: LEFT BREAST LUMPECTOMY WITH NEEDLE LOCALIZATION;  Surgeon: Almond Lint, MD;  Location: [facility] OR;  Service: General;  Laterality: Left;  . Axillary lymph node dissection Left 02/04/2013    Procedure: LEFT AXILLARY LYMPH NODE DISSECTION;  Surgeon: Almond Lint, MD;  Location: [facility] OR;  Service: General;  Laterality: Left;  End: 1512  . Portacath placement Right 02/04/2013    Procedure: INSERTION PORT-A-CATH;  Surgeon: Almond Lint, MD;  Location: [facility] OR;  Service: General;  Laterality: Right;  Start Time: 1610.  Marland Kitchen Appendectomy  1980  REVIEW OF SYSTEMS:   General: fatigue (-), night sweats (-), fever (-), pain (-) Lymph: palpable nodes (-) HEENT: vision changes (-), mucositis (-), gum bleeding (-), epistaxis (-) Cardiovascular: chest pain (-), palpitations (-) Pulmonary: shortness of breath (-), dyspnea on exertion (-), cough (-), hemoptysis (-) GI:  Early satiety (-), melena (-), dysphagia (-), nausea/vomiting (-), diarrhea (-) GU: dysuria (-), hematuria (-), incontinence (-) Musculoskeletal: joint swelling (-), joint pain (-), back pain (-) Neuro: weakness (-), numbness (-), headache (-), confusion (-) Skin: Rash (-), lesions (-), dryness (-) Psych: depression (-), suicidal/homicidal ideation (-), feeling of hopelessness (-)   PHYSICAL EXAMINATION: Blood pressure 124/73, pulse 92, temperature 98.1 F (36.7 C), temperature source Oral, resp. rate 20, height 5\' 2"  (1.575 m), weight 152 lb 4.8 oz (69.083 kg). Body mass index is 27.85 kg/(m^2). General: Patient is a well appearing female in no acute distress HEENT: PERRLA, sclerae anicteric no conjunctival pallor, MMM Neck: supple, no palpable adenopathy Lungs: clear to auscultation bilaterally, no wheezes, rhonchi, or rales Cardiovascular: regular rate rhythm, S1, S2, no murmurs, rubs or gallops Abdomen: Soft, non-tender, non-distended, normoactive bowel sounds, no  HSM Extremities: warm and well perfused, no clubbing, cyanosis, or edema Skin: No rashes or lesions Neuro: Non-focal Breasts: left breast lumpectomy site no nodularity, healing well, scar tissue underneath axillary incision, no erythema, swelling, drainage, right breast no masses or nodularity.  ECOG PERFORMANCE STATUS: 1 - Symptomatic but completely ambulatory  LABORATORY DATA: Lab Results  Component Value Date   WBC 5.7 05/15/2013   HGB 8.8* 05/15/2013   HCT 27.1* 05/15/2013   MCV 85.8 05/15/2013   PLT 237 05/15/2013      Chemistry      Component Value Date/Time   NA 142 05/15/2013 1258   NA 137 02/05/2013 0505   K 3.1* 05/15/2013 1258   K 3.4* 02/05/2013 0505   CL 100 04/23/2013 1447   CL 102 02/05/2013 0505   CO2 29 05/15/2013 1258   CO2 27 02/05/2013 0505   BUN 9.7 05/15/2013 1258   BUN 8 02/05/2013 0505   CREATININE 0.7 05/15/2013 1258   CREATININE 0.86 02/05/2013 0505      Component Value Date/Time   CALCIUM 10.1 05/15/2013 1258   CALCIUM 8.0* 02/05/2013 0505   ALKPHOS 53 05/15/2013 1258   AST 26 05/15/2013 1258   ALT 30 05/15/2013 1258   BILITOT 0.33 05/15/2013 1258       RADIOGRAPHIC STUDIES:  No results found.  ASSESSMENT: 57 year old female with new diagnosis of   #1 multifocal invasive ductal carcinoma grade 3 ER weakly positive at 3% PR negative HER-2/neu negative with Ki-67 of 90% tumor is very aggressive. She does have a palpable lymph node as well in the left axilla. Patient was seen in the multidisciplinary clinic for discussion of treatment options.   #2 patient underwent a lumpectomy with axillary lymph node dissection with Dr. Luz Brazen on 02/04/13. A 1.6 cm tumor was removed with 1/19 lymph nodes positive for disease.    #3 She was recommended adjuvant chemotherapy consisting of Adriamycin Cytoxan given dose dense for total of 4 cycles followed by Taxol carboplatinum weekly for 12 weeks. Rationale for this aggressive treatment was discussed with the patient since she is  essentially has a triple-negative disease.   #4 certainly patient will need post lumpectomy radiation therapy she was seen by radiation oncology. This will begin after patient has completed her chemotherapy.   #5 we discussed role of genetics, and she had an  appointment with Maylon Cos on 03/06/13 the results were negative.       PLAN:  1. Doing well. She will proceed with chemotherapy today.  She is tolerating it well.    2. We will see Ms. Fury back in one week for cycle 3 Taxol/Carbo.  All questions were answered. The patient knows to call the clinic with any problems, questions or concerns. We can certainly see the patient much sooner if necessary.  I spent 25 minutes counseling the patient face to face. The total time spent in the appointment was 30 minutes.  Kristi Ouch Lyn Hollingshead, NP Medical Oncology [facility] Phone: [phone] 05/15/2013, 4:06 PM

## 2013-05-15 NOTE — Patient Instructions (Addendum)
[facility] Discharge Instructions for Patients Receiving Chemotherapy  Today you received the following chemotherapy agents: taxol, carboplatin  To help prevent nausea and vomiting after your treatment, we encourage you to take your nausea medication.  Take it as often as prescribed.     If you develop nausea and vomiting that is not controlled by your nausea medication, call the clinic. If it is after clinic hours your family physician or the after hours number for the clinic or go to the Emergency Department.   BELOW ARE SYMPTOMS THAT SHOULD BE REPORTED IMMEDIATELY:  *FEVER GREATER THAN 100.5 F  *CHILLS WITH OR WITHOUT FEVER  NAUSEA AND VOMITING THAT IS NOT CONTROLLED WITH YOUR NAUSEA MEDICATION  *UNUSUAL SHORTNESS OF BREATH  *UNUSUAL BRUISING OR BLEEDING  TENDERNESS IN MOUTH AND THROAT WITH OR WITHOUT PRESENCE OF ULCERS  *URINARY PROBLEMS  *BOWEL PROBLEMS  UNUSUAL RASH Items with * indicate a potential emergency and should be followed up as soon as possible.  Feel free to call the clinic you have any questions or concerns. The clinic phone number is [phone].   I have been informed and understand all the instructions given to me. I know to contact the clinic, my physician, or go to the Emergency Department if any problems should occur. I do not have any questions at this time, but understand that I may call the clinic during office hours   should I have any questions or need assistance in obtaining follow up care.    __________________________________________  _____________  __________ Signature of Patient or Authorized Representative            Date                   Time    __________________________________________ Nurse's Signature    

## 2013-05-15 NOTE — Patient Instructions (Signed)
Doing well.  Proceed with chemotherapy.  Please call us if you have any questions or concerns.    

## 2013-05-15 NOTE — Progress Notes (Deleted)
OFFICE PROGRESS NOTE  CC**  [name],[name], MD [address], [address] [place] [address]  DIAGNOSIS: 57 year old female with stage IIA ER weakly positive, PR negative HER-2/neu negative, invasive ductal carcinoma of the left breast.   PRIOR THERAPY: 1. Earlier this year it on routine screening mammography the patient was noted to have a abnormality within the 12:00 position of the left breast. Patient returned for additional imaging with 3-D, tomosynthesis. Within the left breast there were 2 masses somewhat lobulated with irregular margins in the upper inner aspect of the left breast. These were separated by approximately a centimeter. On ultrasound the lesion the 11:00 position measured 1.0 x 0.7 cm. The 12:00 lesion measured 1.6 x 0.9 cm. In addition within the axillary region there was a 2.1 x 1.7 suspicious appearing lymph node.. The patient proceeded to undergo ultrasound-guided biopsy of the 2 areas within the left breast as well as the suspicious left axillary lymph node. Biopsies from both breast areas revealed invasive ductal carcinoma. In addition the lymph node from the left axilla revealed ductal carcinoma. The lesions appeared to be high-grade. There was no HER-2/neu amplification and hormone staining was minimal consistent with essentially consistent with triple negative breast cancer.  Patient's case was discussed at the multidisciplinary breast conference her pathology and radiology were reviewed.  2. Patient underwent lumpectomy of the left breast with Dr. Donell Beers on 02/04/13, a 1.6cm tumor was removed along with an axillary lymph node dissection that found 1/19 nodes positive for metastatic disease.    3. Patient was then started on adjuvant chemotherapy on 03/05/13 with Adriamycin/Cytoxan, followed by Taxol Carbo beginning 05/15/13  CURRENT THERAPY: Taxol/Carbo  INTERVAL HISTORY: Kristi [name] 57 y.o. female returns for evaluation  After her fourth cycle of  Adriamycin/Cytoxan.  She is very anxious and crying today.  She's been fatigued since her chemotherapy, and has been becoming "fixated" on her finger tenderness, and having a decreased appetite.  She saw her psychiatrist on Monday, who prescribed medication for her that she did not take.  She denies fevers, chills, nausea, vomiting, constipation, diarrhea, numbness, or any further concerns.  A 10 point ROS is otherwise neg.   MEDICAL HISTORY: Past Medical History  Diagnosis Date  . Anxiety   . GERD (gastroesophageal reflux disease)   . Hypertension   . Breast cancer   . MRSA (methicillin resistant Staphylococcus aureus) 2009    ALLERGIES:  has No Known Allergies.  MEDICATIONS:  Current Outpatient Prescriptions  Medication Sig Dispense Refill  . ALPRAZolam (XANAX) 1 MG tablet Take 2 mg by mouth at bedtime as needed for sleep.       Marland Kitchen amphetamine-dextroamphetamine (ADDERALL) 10 MG tablet Take 20 mg by mouth daily.       . B Complex-C (SUPER B COMPLEX PO) Take 1 tablet by mouth daily.      . ciprofloxacin (CIPRO) 500 MG tablet Take 1 tablet (500 mg total) by mouth 2 (two) times daily.  14 tablet  6  . COMPRO 25 MG suppository       . dexamethasone (DECADRON) 4 MG tablet       . dexamethasone (DECADRON) 4 MG tablet Take 2 tablets (8 mg total) by mouth 2 (two) times daily with a meal. Take two times a day starting the day after chemotherapy for 3 days.  30 tablet  1  . escitalopram (LEXAPRO) 20 MG tablet Take 40 mg by mouth daily.       Marland Kitchen esomeprazole (  NEXIUM) 40 MG capsule Take 40 mg by mouth daily as needed. For reflux      . lidocaine-prilocaine (EMLA) cream Apply topically as needed.  30 g  7  . LORazepam (ATIVAN) 0.5 MG tablet Take 1 tablet (0.5 mg total) by mouth every 6 (six) hours as needed for anxiety.  30 tablet  0  . LORazepam (ATIVAN) 0.5 MG tablet Take 1 tablet (0.5 mg total) by mouth every 6 (six) hours as needed (Nausea or vomiting).  30 tablet  0  . Multiple Vitamin  (MULTIVITAMIN) tablet Take 1 tablet by mouth daily.      . Omega-3 Fatty Acids (FISH OIL PO) Take 1 capsule by mouth daily.      . ondansetron (ZOFRAN) 8 MG tablet       . ondansetron (ZOFRAN) 8 MG tablet Take 1 tablet (8 mg total) by mouth 2 (two) times daily. Take two times a day starting the day after chemo for 3 days. Then take two times a day as needed for nausea or vomiting.  30 tablet  1  . OVER THE COUNTER MEDICATION Take by mouth as needed.      Marland Kitchen OVER THE COUNTER MEDICATION Take by mouth as needed. OTC stool softner, as needed      . potassium chloride SA (K-DUR,KLOR-CON) 20 MEQ tablet Take 1 tablet (20 mEq total) by mouth 2 (two) times daily.  10 tablet  0  . prochlorperazine (COMPAZINE) 10 MG tablet       . prochlorperazine (COMPAZINE) 10 MG tablet Take 1 tablet (10 mg total) by mouth every 6 (six) hours as needed (Nausea or vomiting).  30 tablet  1  . prochlorperazine (COMPAZINE) 25 MG suppository Place 1 suppository (25 mg total) rectally every 12 (twelve) hours as needed for nausea.  12 suppository  3  . valsartan-hydrochlorothiazide (DIOVAN-HCT) 320-25 MG per tablet Take 1 tablet by mouth daily.        No current facility-administered medications for this visit.    SURGICAL HISTORY:  Past Surgical History  Procedure Laterality Date  . Anal sphincterotomy  04/2011  . Hemorrhoid surgery  04/2011    ligation  . Breast lumpectomy with needle localization Left 02/04/2013    Procedure: LEFT BREAST LUMPECTOMY WITH NEEDLE LOCALIZATION;  Surgeon: Almond Lint, MD;  Location: [facility] OR;  Service: General;  Laterality: Left;  . Axillary lymph node dissection Left 02/04/2013    Procedure: LEFT AXILLARY LYMPH NODE DISSECTION;  Surgeon: Almond Lint, MD;  Location: [facility] OR;  Service: General;  Laterality: Left;  End: 1512  . Portacath placement Right 02/04/2013    Procedure: INSERTION PORT-A-CATH;  Surgeon: Almond Lint, MD;  Location: [facility] OR;  Service: General;  Laterality: Right;  Start Time: 1610.   Marland Kitchen Appendectomy  1980    REVIEW OF SYSTEMS:   General: fatigue (-), night sweats (-), fever (-), pain (-) Lymph: palpable nodes (-) HEENT: vision changes (-), mucositis (-), gum bleeding (-), epistaxis (-) Cardiovascular: chest pain (-), palpitations (-) Pulmonary: shortness of breath (-), dyspnea on exertion (-), cough (-), hemoptysis (-) GI:  Early satiety (-), melena (-), dysphagia (-), nausea/vomiting (-), diarrhea (-) GU: dysuria (-), hematuria (-), incontinence (-) Musculoskeletal: joint swelling (-), joint pain (-), back pain (-) Neuro: weakness (-), numbness (-), headache (-), confusion (-) Skin: Rash (-), lesions (-), dryness (-) Psych: depression (-), suicidal/homicidal ideation (-), feeling of hopelessness (-)   PHYSICAL EXAMINATION: Blood pressure 124/73, pulse 92, temperature 98.1 F (36.7 C), temperature  source Oral, resp. rate 20, height 5\' 2"  (1.575 m), weight 152 lb 4.8 oz (69.083 kg). Body mass index is 27.85 kg/(m^2). General: Patient is a well appearing female in no acute distress HEENT: PERRLA, sclerae anicteric no conjunctival pallor, MMM Neck: supple, no palpable adenopathy Lungs: clear to auscultation bilaterally, no wheezes, rhonchi, or rales Cardiovascular: regular rate rhythm, S1, S2, no murmurs, rubs or gallops Abdomen: Soft, non-tender, non-distended, normoactive bowel sounds, no HSM Extremities: warm and well perfused, no clubbing, cyanosis, or edema Skin: No rashes or lesions Neuro: Non-focal Breasts: left breast lumpectomy site no nodularity, healing well, scar tissue underneath axillary incision, no erythema, swelling, drainage, right breast no masses or nodularity.  ECOG PERFORMANCE STATUS: 1 - Symptomatic but completely ambulatory  LABORATORY DATA: Lab Results  Component Value Date   WBC 5.7 05/15/2013   HGB 8.8* 05/15/2013   HCT 27.1* 05/15/2013   MCV 85.8 05/15/2013   PLT 237 05/15/2013      Chemistry      Component Value Date/Time   NA  141 05/07/2013 1247   NA 137 02/05/2013 0505   K 3.2* 05/07/2013 1247   K 3.4* 02/05/2013 0505   CL 100 04/23/2013 1447   CL 102 02/05/2013 0505   CO2 30* 05/07/2013 1247   CO2 27 02/05/2013 0505   BUN 9.2 05/07/2013 1247   BUN 8 02/05/2013 0505   CREATININE 0.7 05/07/2013 1247   CREATININE 0.86 02/05/2013 0505      Component Value Date/Time   CALCIUM 9.6 05/07/2013 1247   CALCIUM 8.0* 02/05/2013 0505   ALKPHOS 66 05/07/2013 1247   AST 24 05/07/2013 1247   ALT 24 05/07/2013 1247   BILITOT <0.20 Repeated and Verified 05/07/2013 1247       RADIOGRAPHIC STUDIES:  No results found.  ASSESSMENT: 57 year old female with new diagnosis of   #1 multifocal invasive ductal carcinoma grade 3 ER weakly positive at 3% PR negative HER-2/neu negative with Ki-67 of 90% tumor is very aggressive. She does have a palpable lymph node as well in the left axilla. Patient was seen in the multidisciplinary clinic for discussion of treatment options.   #2 patient underwent a lumpectomy with axillary lymph node dissection with Dr. Luz Brazen on 02/04/13. A 1.6 cm tumor was removed with 1/19 lymph nodes positive for disease.    #3 She was recommended adjuvant chemotherapy consisting of Adriamycin Cytoxan given dose dense for total of 4 cycles followed by Taxol carboplatinum weekly for 12 weeks. Rationale for this aggressive treatment was discussed with the patient since she is essentially has a triple-negative disease.   #4 certainly patient will need post lumpectomy radiation therapy she was seen by radiation oncology. This will begin after patient has completed her chemotherapy.   #5 we discussed role of genetics, and she had an appointment with Maylon Cos on 03/06/13 the results were negative.       PLAN:  1. Doing well. She is neutropenic following her fourth cycle of adriamycin/cytoxan.  She will have her cipro refilled and I reviewed neutropenic precautions with her in detail.   2. We will see Ms. Mcnair back in two weeks to begin  Taxol/Carbo.  3. We discussed her anxiety and reduction measures and she will continue to f/u with her psychologist as well.    All questions were answered. The patient knows to call the clinic with any problems, questions or concerns. We can certainly see the patient much sooner if necessary.  I spent 25 minutes counseling  the patient face to face. The total time spent in the appointment was 30 minutes.  Cherie Ouch Lyn Hollingshead, NP Medical Oncology [facility] Phone: [phone] 05/15/2013, 1:24 PM

## 2013-05-20 ENCOUNTER — Encounter (INDEPENDENT_AMBULATORY_CARE_PROVIDER_SITE_OTHER): Payer: Self-pay | Admitting: General Surgery

## 2013-05-20 ENCOUNTER — Other Ambulatory Visit (INDEPENDENT_AMBULATORY_CARE_PROVIDER_SITE_OTHER): Payer: Self-pay | Admitting: General Surgery

## 2013-05-20 ENCOUNTER — Ambulatory Visit (INDEPENDENT_AMBULATORY_CARE_PROVIDER_SITE_OTHER): Payer: BC Managed Care – PPO | Admitting: General Surgery

## 2013-05-20 VITALS — BP 90/50 | HR 60 | Resp 14 | Ht 62.0 in | Wt 151.0 lb

## 2013-05-20 DIAGNOSIS — C50212 Malignant neoplasm of upper-inner quadrant of left female breast: Secondary | ICD-10-CM

## 2013-05-20 DIAGNOSIS — M7989 Other specified soft tissue disorders: Secondary | ICD-10-CM

## 2013-05-20 DIAGNOSIS — C50219 Malignant neoplasm of upper-inner quadrant of unspecified female breast: Secondary | ICD-10-CM

## 2013-05-20 DIAGNOSIS — C50912 Malignant neoplasm of unspecified site of left female breast: Secondary | ICD-10-CM

## 2013-05-20 DIAGNOSIS — M799 Soft tissue disorder, unspecified: Secondary | ICD-10-CM

## 2013-05-20 HISTORY — DX: Other specified soft tissue disorders: M79.89

## 2013-05-20 NOTE — Patient Instructions (Signed)
Follow up with me in 6 months.  Refer to PT for evaluation of contracture in left armpit.

## 2013-05-20 NOTE — Progress Notes (Signed)
HISTORY: Patient is a 57 year old female approximately 4 months status post left lumpectomy and axillary lymph node dissection for a multifocal left breast cancer. She has been doing okay with chemotherapy but has had a hard time emotionally. Today is a very bad day and she is tearful.  She has not noticed any new problems with her breasts. She denies breast pain. She is having some issues with her left axilla.  She has some upper left arm pain and tightness in her left armpit.  She has lost her hair from her chemotherapy.  She has not yet received adjuvant radiation.   PERTINENT REVIEW OF SYSTEMS: Otherwise negative.    Filed Vitals:   05/20/13 1658  BP: 90/50  Pulse: 60  Resp: 14   Filed Weights   05/20/13 1658  Weight: 151 lb (68.493 kg)    EXAM: Head: Normocephalic and atraumatic.  Eyes:  Conjunctivae are normal. Pupils are equal, round, and reactive to light. No scleral icterus.  Neck:  Normal range of motion. Neck supple. No tracheal deviation present. No thyromegaly present.  Resp: No respiratory distress, normal effort. Breast:  Left breast has healed well.  Scar is soft.  No masses palpable.  Left axilla demonstrates contracture.  There is not gross arm discrepency, but there is some pitting edema on the dependent aspect of her forearm.  Contracture extends down upper arm to elbow.  Right breast is normal.  Breasts remain reasonably symmetric.   Abd:  Abdomen is soft, non distended and non tender. No masses are palpable.  There is no rebound and no guarding.  Neurological: Alert and oriented to person, place, and time. Coordination normal.  Skin: Skin is warm and dry. No rash noted. No diaphoretic. No erythema. No pallor.  Psychiatric: Normal mood and affect. Normal behavior. Judgment and thought content normal.      ASSESSMENT AND PLAN:   Cancer of upper-inner quadrant of female breast Patient currently undergoing active treatment with chemotherapy.  She has 9 additional  treatments.  I will see her back in approximately 6 months.  I will refer her to physical therapy due to the contracture in her left axilla. Despite the contracture, she does have reasonable mobility of the left shoulder, but she has significant discomfort and would benefit from therapy.        Maudry Diego, MD Surgical Oncology, General & Endocrine Surgery [facility]  Altamese Ken Caryl, MD No ref. provider found

## 2013-05-20 NOTE — Assessment & Plan Note (Signed)
Patient currently undergoing active treatment with chemotherapy.  She has 9 additional treatments.  I will see her back in approximately 6 months.  I will refer her to physical therapy due to the contracture in her left axilla. Despite the contracture, she does have reasonable mobility of the left shoulder, but she has significant discomfort and would benefit from therapy.

## 2013-05-21 ENCOUNTER — Ambulatory Visit: Payer: BC Managed Care – PPO

## 2013-05-21 ENCOUNTER — Other Ambulatory Visit: Payer: BC Managed Care – PPO | Admitting: Lab

## 2013-05-21 ENCOUNTER — Ambulatory Visit: Payer: BC Managed Care – PPO | Admitting: Adult Health

## 2013-05-21 NOTE — Progress Notes (Signed)
Close encounter 

## 2013-05-22 ENCOUNTER — Other Ambulatory Visit (HOSPITAL_BASED_OUTPATIENT_CLINIC_OR_DEPARTMENT_OTHER): Payer: BC Managed Care – PPO | Admitting: Lab

## 2013-05-22 ENCOUNTER — Other Ambulatory Visit: Payer: Self-pay | Admitting: Oncology

## 2013-05-22 ENCOUNTER — Encounter: Payer: Self-pay | Admitting: Oncology

## 2013-05-22 ENCOUNTER — Ambulatory Visit (HOSPITAL_BASED_OUTPATIENT_CLINIC_OR_DEPARTMENT_OTHER): Payer: BC Managed Care – PPO

## 2013-05-22 ENCOUNTER — Ambulatory Visit (HOSPITAL_BASED_OUTPATIENT_CLINIC_OR_DEPARTMENT_OTHER): Payer: BC Managed Care – PPO | Admitting: Adult Health

## 2013-05-22 ENCOUNTER — Encounter: Payer: Self-pay | Admitting: Adult Health

## 2013-05-22 VITALS — BP 99/61 | HR 92 | Temp 98.2°F | Resp 20 | Ht 62.0 in | Wt 150.4 lb

## 2013-05-22 DIAGNOSIS — C50212 Malignant neoplasm of upper-inner quadrant of left female breast: Secondary | ICD-10-CM

## 2013-05-22 DIAGNOSIS — C50919 Malignant neoplasm of unspecified site of unspecified female breast: Secondary | ICD-10-CM

## 2013-05-22 DIAGNOSIS — C773 Secondary and unspecified malignant neoplasm of axilla and upper limb lymph nodes: Secondary | ICD-10-CM

## 2013-05-22 DIAGNOSIS — E876 Hypokalemia: Secondary | ICD-10-CM

## 2013-05-22 DIAGNOSIS — C50219 Malignant neoplasm of upper-inner quadrant of unspecified female breast: Secondary | ICD-10-CM

## 2013-05-22 DIAGNOSIS — Z5111 Encounter for antineoplastic chemotherapy: Secondary | ICD-10-CM

## 2013-05-22 LAB — CBC WITH DIFFERENTIAL/PLATELET
Basophils Absolute: 0 10*3/uL (ref 0.0–0.1)
EOS%: 0.2 % (ref 0.0–7.0)
HCT: 26.8 % — ABNORMAL LOW (ref 34.8–46.6)
HGB: 8.7 g/dL — ABNORMAL LOW (ref 11.6–15.9)
MCH: 28 pg (ref 25.1–34.0)
MCV: 86.2 fL (ref 79.5–101.0)
MONO%: 8.7 % (ref 0.0–14.0)
NEUT%: 60.8 % (ref 38.4–76.8)
Platelets: 170 10*3/uL (ref 145–400)

## 2013-05-22 LAB — COMPREHENSIVE METABOLIC PANEL (CC13)
Albumin: 3.4 g/dL — ABNORMAL LOW (ref 3.5–5.0)
Alkaline Phosphatase: 45 U/L (ref 40–150)
BUN: 14.6 mg/dL (ref 7.0–26.0)
Glucose: 133 mg/dl (ref 70–140)
Potassium: 2.9 mEq/L — CL (ref 3.5–5.1)

## 2013-05-22 MED ORDER — DIPHENHYDRAMINE HCL 50 MG/ML IJ SOLN
50.0000 mg | Freq: Once | INTRAMUSCULAR | Status: AC
  Administered 2013-05-22: 50 mg via INTRAVENOUS

## 2013-05-22 MED ORDER — SODIUM CHLORIDE 0.9 % IV SOLN
80.0000 mg/m2 | Freq: Once | INTRAVENOUS | Status: AC
  Administered 2013-05-22: 138 mg via INTRAVENOUS
  Filled 2013-05-22: qty 23

## 2013-05-22 MED ORDER — SODIUM CHLORIDE 0.9 % IV SOLN
Freq: Once | INTRAVENOUS | Status: AC
  Administered 2013-05-22: 15:00:00 via INTRAVENOUS

## 2013-05-22 MED ORDER — POTASSIUM CHLORIDE CRYS ER 20 MEQ PO TBCR: 20.0000 meq | EXTENDED_RELEASE_TABLET | Freq: Two times a day (BID) | ORAL | Status: DC

## 2013-05-22 MED ORDER — POTASSIUM CHLORIDE CRYS ER 20 MEQ PO TBCR
40.0000 meq | EXTENDED_RELEASE_TABLET | Freq: Once | ORAL | Status: AC
  Administered 2013-05-22: 40 meq via ORAL
  Filled 2013-05-22: qty 2

## 2013-05-22 MED ORDER — SODIUM CHLORIDE 0.9 % IV SOLN
240.0000 mg | Freq: Once | INTRAVENOUS | Status: AC
  Administered 2013-05-22: 240 mg via INTRAVENOUS
  Filled 2013-05-22: qty 24

## 2013-05-22 MED ORDER — ONDANSETRON 16 MG/50ML IVPB (CHCC)
16.0000 mg | Freq: Once | INTRAVENOUS | Status: AC
  Administered 2013-05-22: 16 mg via INTRAVENOUS

## 2013-05-22 MED ORDER — DEXAMETHASONE SODIUM PHOSPHATE 20 MG/5ML IJ SOLN
20.0000 mg | Freq: Once | INTRAMUSCULAR | Status: AC
  Administered 2013-05-22: 20 mg via INTRAVENOUS

## 2013-05-22 MED ORDER — SODIUM CHLORIDE 0.9 % IJ SOLN
10.0000 mL | INTRAMUSCULAR | Status: DC | PRN
  Administered 2013-05-22: 10 mL
  Filled 2013-05-22: qty 10

## 2013-05-22 MED ORDER — SODIUM CHLORIDE 0.9 % IV SOLN
50.0000 mg | Freq: Once | INTRAVENOUS | Status: AC
  Administered 2013-05-22: 50 mg via INTRAVENOUS
  Filled 2013-05-22: qty 2

## 2013-05-22 MED ORDER — FAMOTIDINE IN NACL 20-0.9 MG/50ML-% IV SOLN: 20.0000 mg | Freq: Once | INTRAVENOUS | Status: DC

## 2013-05-22 MED ORDER — HEPARIN SOD (PORK) LOCK FLUSH 100 UNIT/ML IV SOLN
500.0000 [IU] | Freq: Once | INTRAVENOUS | Status: AC | PRN
  Administered 2013-05-22: 500 [IU]
  Filled 2013-05-22: qty 5

## 2013-05-22 NOTE — Progress Notes (Signed)
OFFICE PROGRESS NOTE  CC**  [name],[name], MD [address] [place] [address]  DIAGNOSIS: 57 year old female with stage IIA ER weakly positive, PR negative HER-2/neu negative, invasive ductal carcinoma of the left breast.   PRIOR THERAPY: 1. Earlier this year it on routine screening mammography the patient was noted to have a abnormality within the 12:00 position of the left breast. Patient returned for additional imaging with 3-[name], tomosynthesis. Within the left breast there were 2 masses somewhat lobulated with irregular margins in the upper inner aspect of the left breast. These were separated by approximately a centimeter. On ultrasound the lesion the 11:00 position measured 1.0 x 0.7 cm. The 12:00 lesion measured 1.6 x 0.9 cm. In addition within the axillary region there was a 2.1 x 1.7 suspicious appearing lymph node.. The patient proceeded to undergo ultrasound-guided biopsy of the 2 areas within the left breast as well as the suspicious left axillary lymph node. Biopsies from both breast areas revealed invasive ductal carcinoma. In addition the lymph node from the left axilla revealed ductal carcinoma. The lesions appeared to be high-grade. There was no HER-2/neu amplification and hormone staining was minimal consistent with essentially consistent with triple negative breast cancer.  Patient's case was discussed at the multidisciplinary breast conference her pathology and radiology were reviewed.  2. Patient underwent lumpectomy of the left breast with Dr. Donell Beers on 02/04/13, a 1.6cm tumor was removed along with an axillary lymph node dissection that found 1/19 nodes positive for metastatic disease.    3. Patient was then started on adjuvant chemotherapy on 03/05/13 with Adriamycin/Cytoxan. She began Taxol Carbo on 05/07/13  CURRENT THERAPY: Taxol Carbo week 3  INTERVAL HISTORY: SHALIMAR MCCLAIN 57 y.o. female returns for evaluation prior to Taxol Carbo.  She is doing well  today.  She had severe constipation on Tuesday, and took a laxative, and has improved since.  She saw Dr. Luz Brazen on Tuesday, and she is setting up physical therapy for her left arm.  Otherwise, she denies fevers, chills, nausea, vomiting, constipation, diarrhea, numbness or any further concerns.    MEDICAL HISTORY: Past Medical History  Diagnosis Date  . Anxiety   . GERD (gastroesophageal reflux disease)   . Hypertension   . Breast cancer   . MRSA (methicillin resistant Staphylococcus aureus) 2009    ALLERGIES:  has No Known Allergies.  MEDICATIONS:  Current Outpatient Prescriptions  Medication Sig Dispense Refill  . ALPRAZolam (XANAX) 1 MG tablet Take 2 mg by mouth at bedtime as needed for sleep.       Marland Kitchen amphetamine-dextroamphetamine (ADDERALL) 10 MG tablet Take 20 mg by mouth daily.       . B Complex-C (SUPER B COMPLEX PO) Take 1 tablet by mouth daily.      . ciprofloxacin (CIPRO) 500 MG tablet Take 1 tablet (500 mg total) by mouth 2 (two) times daily.  14 tablet  6  . COMPRO 25 MG suppository       . dexamethasone (DECADRON) 4 MG tablet       . dexamethasone (DECADRON) 4 MG tablet Take 2 tablets (8 mg total) by mouth 2 (two) times daily with a meal. Take two times a day starting the day after chemotherapy for 3 days.  30 tablet  1  . escitalopram (LEXAPRO) 20 MG tablet Take 40 mg by mouth daily.       Marland Kitchen esomeprazole (NEXIUM) 40 MG capsule Take 40 mg by mouth daily as needed. For  reflux      . lidocaine-prilocaine (EMLA) cream Apply topically as needed.  30 g  7  . LORazepam (ATIVAN) 0.5 MG tablet Take 1 tablet (0.5 mg total) by mouth every 6 (six) hours as needed for anxiety.  30 tablet  0  . LORazepam (ATIVAN) 0.5 MG tablet Take 1 tablet (0.5 mg total) by mouth every 6 (six) hours as needed (Nausea or vomiting).  30 tablet  0  . Multiple Vitamin (MULTIVITAMIN) tablet Take 1 tablet by mouth daily.      . Omega-3 Fatty Acids (FISH OIL PO) Take 1 capsule by mouth daily.      .  ondansetron (ZOFRAN) 8 MG tablet       . ondansetron (ZOFRAN) 8 MG tablet Take 1 tablet (8 mg total) by mouth 2 (two) times daily. Take two times a day starting the day after chemo for 3 days. Then take two times a day as needed for nausea or vomiting.  30 tablet  1  . OVER THE COUNTER MEDICATION Take by mouth as needed.      Marland Kitchen OVER THE COUNTER MEDICATION Take by mouth as needed. OTC stool softner, as needed      . potassium chloride SA (K-DUR,KLOR-CON) 20 MEQ tablet Take 1 tablet (20 mEq total) by mouth 2 (two) times daily.  60 tablet  0  . prochlorperazine (COMPAZINE) 10 MG tablet       . prochlorperazine (COMPAZINE) 10 MG tablet Take 1 tablet (10 mg total) by mouth every 6 (six) hours as needed (Nausea or vomiting).  30 tablet  1  . prochlorperazine (COMPAZINE) 25 MG suppository Place 1 suppository (25 mg total) rectally every 12 (twelve) hours as needed for nausea.  12 suppository  3  . valsartan-hydrochlorothiazide (DIOVAN-HCT) 320-25 MG per tablet Take 1 tablet by mouth daily.        No current facility-administered medications for this visit.    SURGICAL HISTORY:  Past Surgical History  Procedure Laterality Date  . Anal sphincterotomy  04/2011  . Hemorrhoid surgery  04/2011    ligation  . Breast lumpectomy with needle localization Left 02/04/2013    Procedure: LEFT BREAST LUMPECTOMY WITH NEEDLE LOCALIZATION;  Surgeon: Almond Lint, MD;  Location: [facility] OR;  Service: General;  Laterality: Left;  . Axillary lymph node dissection Left 02/04/2013    Procedure: LEFT AXILLARY LYMPH NODE DISSECTION;  Surgeon: Almond Lint, MD;  Location: [facility] OR;  Service: General;  Laterality: Left;  End: 1512  . Portacath placement Right 02/04/2013    Procedure: INSERTION PORT-A-CATH;  Surgeon: Almond Lint, MD;  Location: [facility] OR;  Service: General;  Laterality: Right;  Start Time: 9147.  Marland Kitchen Appendectomy  1980    REVIEW OF SYSTEMS:   General: fatigue (-), night sweats (-), fever (-), pain (-) Lymph: palpable nodes  (-) HEENT: vision changes (-), mucositis (-), gum bleeding (-), epistaxis (-) Cardiovascular: chest pain (-), palpitations (-) Pulmonary: shortness of breath (-), dyspnea on exertion (-), cough (-), hemoptysis (-) GI:  Early satiety (-), melena (-), dysphagia (-), nausea/vomiting (-), diarrhea (-) GU: dysuria (-), hematuria (-), incontinence (-) Musculoskeletal: joint swelling (-), joint pain (-), back pain (-) Neuro: weakness (-), numbness (-), headache (-), confusion (-) Skin: Rash (-), lesions (-), dryness (-) Psych: depression (-), suicidal/homicidal ideation (-), feeling of hopelessness (-)   PHYSICAL EXAMINATION: Blood pressure 99/61, pulse 92, temperature 98.2 F (36.8 C), temperature source Oral, resp. rate 20, height 5\' 2"  (1.575 m), weight 150 lb  6.4 oz (68.221 kg). Body mass index is 27.5 kg/(m^2). General: Patient is a well appearing female in no acute distress HEENT: PERRLA, sclerae anicteric no conjunctival pallor, MMM Neck: supple, no palpable adenopathy Lungs: clear to auscultation bilaterally, no wheezes, rhonchi, or rales Cardiovascular: regular rate rhythm, S1, S2, no murmurs, rubs or gallops Abdomen: Soft, non-tender, non-distended, normoactive bowel sounds, no HSM Extremities: warm and well perfused, no clubbing, cyanosis, or edema Skin: No rashes or lesions Neuro: Non-focal Breasts: left breast lumpectomy site no nodularity, healing well, scar tissue underneath axillary incision, no erythema, swelling, drainage, right breast no masses or nodularity.  ECOG PERFORMANCE STATUS: 1 - Symptomatic but completely ambulatory  LABORATORY DATA: Lab Results  Component Value Date   WBC 4.4 05/22/2013   HGB 8.7* 05/22/2013   HCT 26.8* 05/22/2013   MCV 86.2 05/22/2013   PLT 170 05/22/2013      Chemistry      Component Value Date/Time   NA 142 05/22/2013 1255   NA 137 02/05/2013 0505   K 2.9 Repeated and Verified* 05/22/2013 1255   K 3.4* 02/05/2013 0505   CL 100 04/23/2013 1447    CL 102 02/05/2013 0505   CO2 31* 05/22/2013 1255   CO2 27 02/05/2013 0505   BUN 14.6 05/22/2013 1255   BUN 8 02/05/2013 0505   CREATININE 0.7 05/22/2013 1255   CREATININE 0.86 02/05/2013 0505      Component Value Date/Time   CALCIUM 9.9 05/22/2013 1255   CALCIUM 8.0* 02/05/2013 0505   ALKPHOS 45 05/22/2013 1255   AST 20 05/22/2013 1255   ALT 26 05/22/2013 1255   BILITOT 0.29 05/22/2013 1255       RADIOGRAPHIC STUDIES:  No results found.  ASSESSMENT: 57 year old female with new diagnosis of   #1 multifocal invasive ductal carcinoma grade 3 ER weakly positive at 3% PR negative HER-2/neu negative with Ki-67 of 90% tumor is very aggressive. She does have a palpable lymph node as well in the left axilla. Patient was seen in the multidisciplinary clinic for discussion of treatment options.   #2 patient underwent a lumpectomy with axillary lymph node dissection with Dr. Luz Brazen on 02/04/13. A 1.6 cm tumor was removed with 1/19 lymph nodes positive for disease.    #3 She was recommended adjuvant chemotherapy consisting of Adriamycin Cytoxan given dose dense for total of 4 cycles followed by Taxol carboplatinum weekly for 12 weeks. Rationale for this aggressive treatment was discussed with the patient since she is essentially has a triple-negative disease.   #4 certainly patient will need post lumpectomy radiation therapy she was seen by radiation oncology. This will begin after patient has completed her chemotherapy.   #5 we discussed role of genetics, and she had an appointment with Maylon Cos on 03/06/13 the results were negative.       PLAN:  1. Doing well. She will proceed with chemotherapy today.  She is tolerating it well.    2. We will see Ms. Hollenberg back in one week for cycle 4 Taxol/Carbo.  All questions were answered. The patient knows to call the clinic with any problems, questions or concerns. We can certainly see the patient much sooner if necessary.  I spent 25 minutes counseling the  patient face to face. The total time spent in the appointment was 30 minutes.  Cherie Ouch Lyn Hollingshead, NP Medical Oncology [facility] Phone: [phone] 05/22/2013, 10:17 PM

## 2013-05-22 NOTE — Patient Instructions (Addendum)
Doing well.  Proceed with chemotherapy.  Please call us if you have any questions or concerns.    

## 2013-05-22 NOTE — Patient Instructions (Addendum)
[facility] Discharge Instructions for Patients Receiving Chemotherapy  Today you received the following chemotherapy agents: Taxol and Carboplatin  To help prevent nausea and vomiting after your treatment, we encourage you to take your nausea medication as instructed by your physician.  If you develop nausea and vomiting that is not controlled by your nausea medication, call the clinic.   BELOW ARE SYMPTOMS THAT SHOULD BE REPORTED IMMEDIATELY:  *FEVER GREATER THAN 100.5 F  *CHILLS WITH OR WITHOUT FEVER  NAUSEA AND VOMITING THAT IS NOT CONTROLLED WITH YOUR NAUSEA MEDICATION  *UNUSUAL SHORTNESS OF BREATH  *UNUSUAL BRUISING OR BLEEDING  TENDERNESS IN MOUTH AND THROAT WITH OR WITHOUT PRESENCE OF ULCERS  *URINARY PROBLEMS  *BOWEL PROBLEMS  UNUSUAL RASH Items with * indicate a potential emergency and should be followed up as soon as possible.  Feel free to call the clinic you have any questions or concerns. The clinic phone number is [phone].

## 2013-05-23 ENCOUNTER — Telehealth: Payer: Self-pay | Admitting: *Deleted

## 2013-05-23 NOTE — Telephone Encounter (Signed)
Per staff message and POF I have scheduled appts.  JMW  

## 2013-05-27 NOTE — Addendum Note (Signed)
Addended byLiliana Cline on: 05/27/2013 10:14 AM   Modules accepted: Orders

## 2013-05-28 ENCOUNTER — Ambulatory Visit: Payer: BC Managed Care – PPO | Admitting: Adult Health

## 2013-05-28 ENCOUNTER — Other Ambulatory Visit: Payer: Self-pay | Admitting: Medical Oncology

## 2013-05-28 ENCOUNTER — Ambulatory Visit: Payer: BC Managed Care – PPO

## 2013-05-28 ENCOUNTER — Other Ambulatory Visit: Payer: BC Managed Care – PPO | Admitting: Lab

## 2013-05-28 DIAGNOSIS — C50912 Malignant neoplasm of unspecified site of left female breast: Secondary | ICD-10-CM

## 2013-05-29 ENCOUNTER — Ambulatory Visit (HOSPITAL_BASED_OUTPATIENT_CLINIC_OR_DEPARTMENT_OTHER): Payer: BC Managed Care – PPO

## 2013-05-29 ENCOUNTER — Telehealth: Payer: Self-pay | Admitting: Oncology

## 2013-05-29 ENCOUNTER — Encounter: Payer: Self-pay | Admitting: Adult Health

## 2013-05-29 ENCOUNTER — Encounter: Payer: Self-pay | Admitting: Oncology

## 2013-05-29 ENCOUNTER — Other Ambulatory Visit: Payer: BC Managed Care – PPO | Admitting: Lab

## 2013-05-29 ENCOUNTER — Ambulatory Visit (HOSPITAL_BASED_OUTPATIENT_CLINIC_OR_DEPARTMENT_OTHER): Payer: BC Managed Care – PPO | Admitting: Adult Health

## 2013-05-29 ENCOUNTER — Ambulatory Visit: Payer: BC Managed Care – PPO | Admitting: Oncology

## 2013-05-29 ENCOUNTER — Other Ambulatory Visit (HOSPITAL_BASED_OUTPATIENT_CLINIC_OR_DEPARTMENT_OTHER): Payer: BC Managed Care – PPO

## 2013-05-29 VITALS — BP 118/63 | HR 84 | Temp 98.1°F | Resp 20 | Ht 62.0 in | Wt 148.3 lb

## 2013-05-29 DIAGNOSIS — C50912 Malignant neoplasm of unspecified site of left female breast: Secondary | ICD-10-CM

## 2013-05-29 DIAGNOSIS — C50219 Malignant neoplasm of upper-inner quadrant of unspecified female breast: Secondary | ICD-10-CM

## 2013-05-29 DIAGNOSIS — Z5111 Encounter for antineoplastic chemotherapy: Secondary | ICD-10-CM

## 2013-05-29 DIAGNOSIS — C50212 Malignant neoplasm of upper-inner quadrant of left female breast: Secondary | ICD-10-CM

## 2013-05-29 DIAGNOSIS — C773 Secondary and unspecified malignant neoplasm of axilla and upper limb lymph nodes: Secondary | ICD-10-CM

## 2013-05-29 DIAGNOSIS — Z171 Estrogen receptor negative status [ER-]: Secondary | ICD-10-CM

## 2013-05-29 LAB — COMPREHENSIVE METABOLIC PANEL (CC13)
ALT: 35 U/L (ref 0–55)
Albumin: 3.8 g/dL (ref 3.5–5.0)
Alkaline Phosphatase: 45 U/L (ref 40–150)
Glucose: 83 mg/dl (ref 70–140)
Potassium: 3.9 mEq/L (ref 3.5–5.1)
Sodium: 141 mEq/L (ref 136–145)
Total Protein: 6.3 g/dL — ABNORMAL LOW (ref 6.4–8.3)

## 2013-05-29 LAB — CBC WITH DIFFERENTIAL/PLATELET
Basophils Absolute: 0 10*3/uL (ref 0.0–0.1)
EOS%: 0.4 % (ref 0.0–7.0)
Eosinophils Absolute: 0 10*3/uL (ref 0.0–0.5)
HGB: 8.7 g/dL — ABNORMAL LOW (ref 11.6–15.9)
MCH: 28.8 pg (ref 25.1–34.0)
NEUT#: 1.3 10*3/uL — ABNORMAL LOW (ref 1.5–6.5)
RDW: 20.7 % — ABNORMAL HIGH (ref 11.2–14.5)
lymph#: 1.2 10*3/uL (ref 0.9–3.3)

## 2013-05-29 MED ORDER — FAMOTIDINE IN NACL 20-0.9 MG/50ML-% IV SOLN
20.0000 mg | Freq: Once | INTRAVENOUS | Status: AC
  Administered 2013-05-29: 20 mg via INTRAVENOUS

## 2013-05-29 MED ORDER — PACLITAXEL CHEMO INJECTION 300 MG/50ML
80.0000 mg/m2 | Freq: Once | INTRAVENOUS | Status: AC
  Administered 2013-05-29: 138 mg via INTRAVENOUS
  Filled 2013-05-29: qty 23

## 2013-05-29 MED ORDER — DEXAMETHASONE SODIUM PHOSPHATE 20 MG/5ML IJ SOLN
20.0000 mg | Freq: Once | INTRAMUSCULAR | Status: AC
  Administered 2013-05-29: 20 mg via INTRAVENOUS

## 2013-05-29 MED ORDER — SODIUM CHLORIDE 0.9 % IV SOLN
243.4000 mg | Freq: Once | INTRAVENOUS | Status: AC
  Administered 2013-05-29: 240 mg via INTRAVENOUS
  Filled 2013-05-29: qty 24

## 2013-05-29 MED ORDER — SODIUM CHLORIDE 0.9 % IV SOLN
Freq: Once | INTRAVENOUS | Status: AC
  Administered 2013-05-29: 14:00:00 via INTRAVENOUS

## 2013-05-29 MED ORDER — DIPHENHYDRAMINE HCL 50 MG/ML IJ SOLN
50.0000 mg | Freq: Once | INTRAMUSCULAR | Status: AC
  Administered 2013-05-29: 50 mg via INTRAVENOUS

## 2013-05-29 MED ORDER — SODIUM CHLORIDE 0.9 % IJ SOLN
10.0000 mL | INTRAMUSCULAR | Status: DC | PRN
  Administered 2013-05-29: 10 mL
  Filled 2013-05-29: qty 10

## 2013-05-29 MED ORDER — ONDANSETRON 16 MG/50ML IVPB (CHCC)
16.0000 mg | Freq: Once | INTRAVENOUS | Status: AC
  Administered 2013-05-29: 16 mg via INTRAVENOUS

## 2013-05-29 MED ORDER — HEPARIN SOD (PORK) LOCK FLUSH 100 UNIT/ML IV SOLN
500.0000 [IU] | Freq: Once | INTRAVENOUS | Status: AC | PRN
  Administered 2013-05-29: 500 [IU]
  Filled 2013-05-29: qty 5

## 2013-05-29 NOTE — Progress Notes (Signed)
OFFICE PROGRESS NOTE  CC**  [name],[name], MD [address] [place] [address]  DIAGNOSIS: 57 year old female with stage IIA ER weakly positive, PR negative HER-2/neu negative, invasive ductal carcinoma of the left breast.   PRIOR THERAPY: 1. Earlier this year it on routine screening mammography the patient was noted to have a abnormality within the 12:00 position of the left breast. Patient returned for additional imaging with 3-D, tomosynthesis. Within the left breast there were 2 masses somewhat lobulated with irregular margins in the upper inner aspect of the left breast. These were separated by approximately a centimeter. On ultrasound the lesion the 11:00 position measured 1.0 x 0.7 cm. The 12:00 lesion measured 1.6 x 0.9 cm. In addition within the axillary region there was a 2.1 x 1.7 suspicious appearing lymph node.. The patient proceeded to undergo ultrasound-guided biopsy of the 2 areas within the left breast as well as the suspicious left axillary lymph node. Biopsies from both breast areas revealed invasive ductal carcinoma. In addition the lymph node from the left axilla revealed ductal carcinoma. The lesions appeared to be high-grade. There was no HER-2/neu amplification and hormone staining was minimal consistent with essentially consistent with triple negative breast cancer.  Patient's case was discussed at the multidisciplinary breast conference her pathology and radiology were reviewed.  2. Patient underwent lumpectomy of the left breast with Dr. Donell Beers on 02/04/13, a 1.6cm tumor was removed along with an axillary lymph node dissection that found 1/19 nodes positive for metastatic disease.    3. Patient was then started on adjuvant chemotherapy on 03/05/13 with Adriamycin/Cytoxan. She began Taxol Carbo on 05/07/13  CURRENT THERAPY: Taxol Carbo week 4  INTERVAL HISTORY: Kristi [name] 57 y.o. female returns for evaluation prior to Taxol Carbo.  She's doing well  this week.  She denies fevers, chills, nausea, vomiting, constipation, diarrhea, numbness, or any further concerns.  A 10 point ROS is neg.   MEDICAL HISTORY: Past Medical History  Diagnosis Date  . Anxiety   . GERD (gastroesophageal reflux disease)   . Hypertension   . Breast cancer   . MRSA (methicillin resistant Staphylococcus aureus) 2009    ALLERGIES:  has No Known Allergies.  MEDICATIONS:  Current Outpatient Prescriptions  Medication Sig Dispense Refill  . ALPRAZolam (XANAX) 1 MG tablet Take 2 mg by mouth at bedtime as needed for sleep.       Marland Kitchen amphetamine-dextroamphetamine (ADDERALL) 10 MG tablet Take 20 mg by mouth daily.       . B Complex-C (SUPER B COMPLEX PO) Take 1 tablet by mouth daily.      . ciprofloxacin (CIPRO) 500 MG tablet Take 1 tablet (500 mg total) by mouth 2 (two) times daily.  14 tablet  6  . COMPRO 25 MG suppository       . dexamethasone (DECADRON) 4 MG tablet       . dexamethasone (DECADRON) 4 MG tablet Take 2 tablets (8 mg total) by mouth 2 (two) times daily with a meal. Take two times a day starting the day after chemotherapy for 3 days.  30 tablet  1  . escitalopram (LEXAPRO) 20 MG tablet Take 40 mg by mouth daily.       Marland Kitchen esomeprazole (NEXIUM) 40 MG capsule Take 40 mg by mouth daily as needed. For reflux      . lidocaine-prilocaine (EMLA) cream Apply topically as needed.  30 g  7  . LORazepam (ATIVAN) 0.5 MG tablet Take 1  tablet (0.5 mg total) by mouth every 6 (six) hours as needed for anxiety.  30 tablet  0  . LORazepam (ATIVAN) 0.5 MG tablet Take 1 tablet (0.5 mg total) by mouth every 6 (six) hours as needed (Nausea or vomiting).  30 tablet  0  . Multiple Vitamin (MULTIVITAMIN) tablet Take 1 tablet by mouth daily.      . Omega-3 Fatty Acids (FISH OIL PO) Take 1 capsule by mouth daily.      . ondansetron (ZOFRAN) 8 MG tablet       . ondansetron (ZOFRAN) 8 MG tablet Take 1 tablet (8 mg total) by mouth 2 (two) times daily. Take two times a day starting the  day after chemo for 3 days. Then take two times a day as needed for nausea or vomiting.  30 tablet  1  . OVER THE COUNTER MEDICATION Take by mouth as needed.      Marland Kitchen OVER THE COUNTER MEDICATION Take by mouth as needed. OTC stool softner, as needed      . potassium chloride SA (K-DUR,KLOR-CON) 20 MEQ tablet Take 1 tablet (20 mEq total) by mouth 2 (two) times daily.  60 tablet  0  . prochlorperazine (COMPAZINE) 10 MG tablet       . prochlorperazine (COMPAZINE) 10 MG tablet Take 1 tablet (10 mg total) by mouth every 6 (six) hours as needed (Nausea or vomiting).  30 tablet  1  . prochlorperazine (COMPAZINE) 25 MG suppository Place 1 suppository (25 mg total) rectally every 12 (twelve) hours as needed for nausea.  12 suppository  3  . valsartan-hydrochlorothiazide (DIOVAN-HCT) 320-25 MG per tablet Take 1 tablet by mouth daily.        No current facility-administered medications for this visit.    SURGICAL HISTORY:  Past Surgical History  Procedure Laterality Date  . Anal sphincterotomy  04/2011  . Hemorrhoid surgery  04/2011    ligation  . Breast lumpectomy with needle localization Left 02/04/2013    Procedure: LEFT BREAST LUMPECTOMY WITH NEEDLE LOCALIZATION;  Surgeon: Almond Lint, MD;  Location: [facility] OR;  Service: General;  Laterality: Left;  . Axillary lymph node dissection Left 02/04/2013    Procedure: LEFT AXILLARY LYMPH NODE DISSECTION;  Surgeon: Almond Lint, MD;  Location: [facility] OR;  Service: General;  Laterality: Left;  End: 1512  . Portacath placement Right 02/04/2013    Procedure: INSERTION PORT-A-CATH;  Surgeon: Almond Lint, MD;  Location: [facility] OR;  Service: General;  Laterality: Right;  Start Time: 1610.  Marland Kitchen Appendectomy  1980    REVIEW OF SYSTEMS:   General: fatigue (-), night sweats (-), fever (-), pain (-) Lymph: palpable nodes (-) HEENT: vision changes (-), mucositis (-), gum bleeding (-), epistaxis (-) Cardiovascular: chest pain (-), palpitations (-) Pulmonary: shortness of breath (-),  dyspnea on exertion (-), cough (-), hemoptysis (-) GI:  Early satiety (-), melena (-), dysphagia (-), nausea/vomiting (-), diarrhea (-) GU: dysuria (-), hematuria (-), incontinence (-) Musculoskeletal: joint swelling (-), joint pain (-), back pain (-) Neuro: weakness (-), numbness (-), headache (-), confusion (-) Skin: Rash (-), lesions (-), dryness (-) Psych: depression (-), suicidal/homicidal ideation (-), feeling of hopelessness (-)   PHYSICAL EXAMINATION: Blood pressure 118/63, pulse 84, temperature 98.1 F (36.7 C), temperature source Oral, resp. rate 20, height 5\' 2"  (1.575 m), weight 148 lb 4.8 oz (67.268 kg). Body mass index is 27.12 kg/(m^2). General: Patient is a well appearing female in no acute distress HEENT: PERRLA, sclerae anicteric no conjunctival pallor,  MMM Neck: supple, no palpable adenopathy Lungs: clear to auscultation bilaterally, no wheezes, rhonchi, or rales Cardiovascular: regular rate rhythm, S1, S2, no murmurs, rubs or gallops Abdomen: Soft, non-tender, non-distended, normoactive bowel sounds, no HSM Extremities: warm and well perfused, no clubbing, cyanosis, or edema Skin: No rashes or lesions Neuro: Non-focal Breasts: left breast lumpectomy site no nodularity, healing well, scar tissue underneath axillary incision, no erythema, swelling, drainage, right breast no masses or nodularity.  ECOG PERFORMANCE STATUS: 1 - Symptomatic but completely ambulatory  LABORATORY DATA: Lab Results  Component Value Date   WBC 2.8* 05/29/2013   HGB 8.7* 05/29/2013   HCT 26.6* 05/29/2013   MCV 88.1 05/29/2013   PLT 143* 05/29/2013      Chemistry      Component Value Date/Time   NA 142 05/22/2013 1255   NA 137 02/05/2013 0505   K 2.9 Repeated and Verified* 05/22/2013 1255   K 3.4* 02/05/2013 0505   CL 100 04/23/2013 1447   CL 102 02/05/2013 0505   CO2 31* 05/22/2013 1255   CO2 27 02/05/2013 0505   BUN 14.6 05/22/2013 1255   BUN 8 02/05/2013 0505   CREATININE 0.7 05/22/2013 1255    CREATININE 0.86 02/05/2013 0505      Component Value Date/Time   CALCIUM 9.9 05/22/2013 1255   CALCIUM 8.0* 02/05/2013 0505   ALKPHOS 45 05/22/2013 1255   AST 20 05/22/2013 1255   ALT 26 05/22/2013 1255   BILITOT 0.29 05/22/2013 1255       RADIOGRAPHIC STUDIES:  No results found.  ASSESSMENT: 57 year old female with new diagnosis of   #1 multifocal invasive ductal carcinoma grade 3 ER weakly positive at 3% PR negative HER-2/neu negative with Ki-67 of 90% tumor is very aggressive. She does have a palpable lymph node as well in the left axilla. Patient was seen in the multidisciplinary clinic for discussion of treatment options.   #2 patient underwent a lumpectomy with axillary lymph node dissection with Dr. Luz Brazen on 02/04/13. A 1.6 cm tumor was removed with 1/19 lymph nodes positive for disease.    #3 She was recommended adjuvant chemotherapy consisting of Adriamycin Cytoxan given dose dense for total of 4 cycles followed by Taxol carboplatinum weekly for 12 weeks. Rationale for this aggressive treatment was discussed with the patient since she is essentially has a triple-negative disease.   #4 certainly patient will need post lumpectomy radiation therapy she was seen by radiation oncology. This will begin after patient has completed her chemotherapy.   #5 we discussed role of genetics, and she had an appointment with Maylon Cos on 03/06/13 the results were negative.       PLAN:  1. Doing well. She will proceed with chemotherapy today.  She will receive Neupogen x 4 days as her ANC is 1300.  I reviewed this with the patient and gave her information on the medication in the AVS.    2. We will see Ms. Dobberstein back in one week for cycle 5 Taxol/Carbo.  All questions were answered. The patient knows to call the clinic with any problems, questions or concerns. We can certainly see the patient much sooner if necessary.  I spent 25 minutes counseling the patient face to face. The total time spent  in the appointment was 30 minutes.  Cherie Ouch Lyn Hollingshead, NP Medical Oncology [facility] Phone: [phone] 05/29/2013, 1:59 PM

## 2013-05-29 NOTE — Patient Instructions (Addendum)
[facility] Discharge Instructions for Patients Receiving Chemotherapy  Today you received the following chemotherapy agents: Taxol/ Carboplatin  To help prevent nausea and vomiting after your treatment, we encourage you to take your nausea medication as needed.   If you develop nausea and vomiting that is not controlled by your nausea medication, call the clinic.   BELOW ARE SYMPTOMS THAT SHOULD BE REPORTED IMMEDIATELY:  *FEVER GREATER THAN 100.5 F  *CHILLS WITH OR WITHOUT FEVER  NAUSEA AND VOMITING THAT IS NOT CONTROLLED WITH YOUR NAUSEA MEDICATION  *UNUSUAL SHORTNESS OF BREATH  *UNUSUAL BRUISING OR BLEEDING  TENDERNESS IN MOUTH AND THROAT WITH OR WITHOUT PRESENCE OF ULCERS  *URINARY PROBLEMS  *BOWEL PROBLEMS  UNUSUAL RASH Items with * indicate a potential emergency and should be followed up as soon as possible.  Feel free to call the clinic you have any questions or concerns. The clinic phone number is [phone].    

## 2013-05-29 NOTE — Progress Notes (Signed)
OK to treat with current CBC, Augustin Schooling NP

## 2013-05-29 NOTE — Patient Instructions (Signed)
Take Claritin daily starting tomorrow.  Take Tylenol if needed for bone pain.  You will receive Neupogen for four days 7/25, 7/26, 7/28, 7/29.  Filgrastim, G-CSF injection What is this medicine? FILGRASTIM, G-CSF (fil GRA stim) stimulates the formation of white blood cells. This medicine is given to patients with conditions that may cause a decrease in white blood cells, like those receiving certain types of chemotherapy or bone marrow transplant. It helps the bone marrow recover its ability to produce white blood cells. Increasing the amount of white blood cells helps to decrease the risk of infection and fever. This medicine may be used for other purposes; ask your health care provider or pharmacist if you have questions. What should I tell my health care provider before I take this medicine? They need to know if you have any of these conditions: -currently receiving radiation therapy -sickle cell disease -an unusual or allergic reaction to filgrastim, E. coli protein, other medicines, foods, dyes, or preservatives -pregnant or trying to get pregnant -breast-feeding How should I use this medicine? This medicine is for injection into a vein or injection under the skin. It is usually given by a health care professional in a hospital or clinic setting. If you get this medicine at home, you will be taught how to prepare and give this medicine. Always change the site for the injection under the skin. Let the solution warm to room temperature before you use it. Do not shake the solution before you withdraw a dose. Throw away any unused portion. Use exactly as directed. Take your medicine at regular intervals. Do not take your medicine more often than directed. It is important that you put your used needles and syringes in a special sharps container. Do not put them in a trash can. If you do not have a sharps container, call your pharmacist or healthcare provider to get one. Talk to your pediatrician  regarding the use of this medicine in children. While this medicine may be prescribed for children for selected conditions, precautions do apply. Overdosage: If you think you have taken too much of this medicine contact a poison control center or emergency room at once. NOTE: This medicine is only for you. Do not share this medicine with others. What if I miss a dose? Try not to miss doses. If you miss a dose take the dose as soon as you remember. If it is almost time for the next dose, do not take double doses unless told to by your doctor or health care professional. What may interact with this medicine? -lithium -medicines for cancer chemotherapy This list may not describe all possible interactions. Give your health care provider a list of all the medicines, herbs, non-prescription drugs, or dietary supplements you use. Also tell them if you smoke, drink alcohol, or use illegal drugs. Some items may interact with your medicine. What should I watch for while using this medicine? Visit your doctor or health care professional for regular checks on your progress. If you get a fever or any sign of infection while you are using this medicine, do not treat yourself. Check with your doctor or health care professional. Bone pain can usually be relieved by mild pain relievers such as acetaminophen or ibuprofen. Check with your doctor or health care professional before taking these medicines as they may hide a fever. Call your doctor or health care professional if the aches and pains are severe or do not go away. What side effects may I notice from  receiving this medicine? Side effects that you should report to your doctor or health care professional as soon as possible: -allergic reactions like skin rash, itching or hives, swelling of the face, lips, or tongue -difficulty breathing, wheezing -fever -pain, redness, or swelling at the injection site -stomach or side pain, or pain at the shoulder Side  effects that usually do not require medical attention (report to your doctor or health care professional if they continue or are bothersome): -bone pain (ribs, lower back, breast bone) -headache -skin rash This list may not describe all possible side effects. Call your doctor for medical advice about side effects. You may report side effects to FDA at 1-800-FDA-1088. Where should I keep my medicine? Keep out of the reach of children. Store in a refrigerator between 2 and 8 degrees C (36 and 46 degrees F). Do not freeze or leave in direct sunlight. If vials or syringes are left out of the refrigerator for more than 24 hours, they must be thrown away. Throw away unused vials after the expiration date on the carton. NOTE: This sheet is a summary. It may not cover all possible information. If you have questions about this medicine, talk to your doctor, pharmacist, or health care provider.  2013, [employer]. (01/08/2008 1:33:21 PM)

## 2013-05-29 NOTE — Telephone Encounter (Signed)
Added inj appts 7/25, 7/26, 7/28 and 7/29. Pt given new schedule.

## 2013-05-29 NOTE — Progress Notes (Signed)
Put fmla form on nurse's desk °

## 2013-05-30 ENCOUNTER — Ambulatory Visit (HOSPITAL_BASED_OUTPATIENT_CLINIC_OR_DEPARTMENT_OTHER): Payer: BC Managed Care – PPO

## 2013-05-30 VITALS — BP 115/67 | HR 96 | Temp 98.0°F

## 2013-05-30 DIAGNOSIS — C50219 Malignant neoplasm of upper-inner quadrant of unspecified female breast: Secondary | ICD-10-CM

## 2013-05-30 DIAGNOSIS — Z5189 Encounter for other specified aftercare: Secondary | ICD-10-CM

## 2013-05-30 MED ORDER — FILGRASTIM 480 MCG/0.8ML IJ SOLN
480.0000 ug | Freq: Once | INTRAMUSCULAR | Status: AC
  Administered 2013-05-30: 480 ug via SUBCUTANEOUS
  Filled 2013-05-30: qty 0.8

## 2013-05-31 ENCOUNTER — Ambulatory Visit (HOSPITAL_BASED_OUTPATIENT_CLINIC_OR_DEPARTMENT_OTHER): Payer: BC Managed Care – PPO

## 2013-05-31 VITALS — BP 121/71 | HR 107 | Temp 97.8°F | Resp 20

## 2013-05-31 DIAGNOSIS — C50219 Malignant neoplasm of upper-inner quadrant of unspecified female breast: Secondary | ICD-10-CM

## 2013-05-31 DIAGNOSIS — Z5189 Encounter for other specified aftercare: Secondary | ICD-10-CM

## 2013-05-31 MED ORDER — FILGRASTIM 480 MCG/0.8ML IJ SOLN
480.0000 ug | Freq: Once | INTRAMUSCULAR | Status: AC
  Administered 2013-05-31: 480 ug via SUBCUTANEOUS

## 2013-06-02 ENCOUNTER — Ambulatory Visit (HOSPITAL_BASED_OUTPATIENT_CLINIC_OR_DEPARTMENT_OTHER): Payer: BC Managed Care – PPO

## 2013-06-02 ENCOUNTER — Encounter: Payer: Self-pay | Admitting: Specialist

## 2013-06-02 VITALS — BP 105/68 | HR 84 | Temp 98.4°F

## 2013-06-02 DIAGNOSIS — Z5189 Encounter for other specified aftercare: Secondary | ICD-10-CM

## 2013-06-02 DIAGNOSIS — C50219 Malignant neoplasm of upper-inner quadrant of unspecified female breast: Secondary | ICD-10-CM

## 2013-06-02 MED ORDER — FILGRASTIM 480 MCG/0.8ML IJ SOLN
480.0000 ug | Freq: Once | INTRAMUSCULAR | Status: AC
  Administered 2013-06-02: 480 ug via SUBCUTANEOUS
  Filled 2013-06-02: qty 0.8

## 2013-06-02 NOTE — Progress Notes (Signed)
I contacted Ms. Gaglio by phone at the request of Marianne Sofia, breast cancer navigator.  Ms Groll had been tearful and seemingly in need of support last week at multidisciplinary clinic.  I reached Ms. Schloss by phone, who reported that she is doing better in dealing with her situation.  She appreciated the outreach of support.  I encouraged her to contact the Patient and [facility] if she found that she needed additional support.

## 2013-06-03 ENCOUNTER — Ambulatory Visit (HOSPITAL_BASED_OUTPATIENT_CLINIC_OR_DEPARTMENT_OTHER): Payer: BC Managed Care – PPO

## 2013-06-03 VITALS — BP 101/53 | HR 93 | Temp 98.4°F

## 2013-06-03 DIAGNOSIS — Z5189 Encounter for other specified aftercare: Secondary | ICD-10-CM

## 2013-06-03 DIAGNOSIS — C50219 Malignant neoplasm of upper-inner quadrant of unspecified female breast: Secondary | ICD-10-CM

## 2013-06-03 MED ORDER — FILGRASTIM 480 MCG/0.8ML IJ SOLN
480.0000 ug | Freq: Once | INTRAMUSCULAR | Status: AC
  Administered 2013-06-03: 480 ug via SUBCUTANEOUS
  Filled 2013-06-03: qty 0.8

## 2013-06-04 ENCOUNTER — Ambulatory Visit: Payer: BC Managed Care – PPO

## 2013-06-04 ENCOUNTER — Telehealth: Payer: Self-pay | Admitting: *Deleted

## 2013-06-04 ENCOUNTER — Encounter: Payer: Self-pay | Admitting: Oncology

## 2013-06-04 ENCOUNTER — Ambulatory Visit (HOSPITAL_BASED_OUTPATIENT_CLINIC_OR_DEPARTMENT_OTHER): Payer: BC Managed Care – PPO | Admitting: Adult Health

## 2013-06-04 ENCOUNTER — Other Ambulatory Visit (HOSPITAL_BASED_OUTPATIENT_CLINIC_OR_DEPARTMENT_OTHER): Payer: BC Managed Care – PPO | Admitting: Lab

## 2013-06-04 ENCOUNTER — Encounter: Payer: Self-pay | Admitting: Adult Health

## 2013-06-04 VITALS — BP 109/71 | HR 88 | Temp 98.4°F | Resp 20 | Ht 62.0 in | Wt 147.5 lb

## 2013-06-04 DIAGNOSIS — C50919 Malignant neoplasm of unspecified site of unspecified female breast: Secondary | ICD-10-CM

## 2013-06-04 DIAGNOSIS — D696 Thrombocytopenia, unspecified: Secondary | ICD-10-CM

## 2013-06-04 DIAGNOSIS — C50212 Malignant neoplasm of upper-inner quadrant of left female breast: Secondary | ICD-10-CM

## 2013-06-04 DIAGNOSIS — D709 Neutropenia, unspecified: Secondary | ICD-10-CM

## 2013-06-04 DIAGNOSIS — C50219 Malignant neoplasm of upper-inner quadrant of unspecified female breast: Secondary | ICD-10-CM

## 2013-06-04 DIAGNOSIS — C50912 Malignant neoplasm of unspecified site of left female breast: Secondary | ICD-10-CM

## 2013-06-04 LAB — COMPREHENSIVE METABOLIC PANEL (CC13)
Alkaline Phosphatase: 45 U/L (ref 40–150)
BUN: 12.6 mg/dL (ref 7.0–26.0)
Creatinine: 0.7 mg/dL (ref 0.6–1.1)
Glucose: 96 mg/dl (ref 70–140)
Total Bilirubin: 0.47 mg/dL (ref 0.20–1.20)

## 2013-06-04 LAB — CBC WITH DIFFERENTIAL/PLATELET
Basophils Absolute: 0 10*3/uL (ref 0.0–0.1)
EOS%: 0 % (ref 0.0–7.0)
HCT: 26.2 % — ABNORMAL LOW (ref 34.8–46.6)
HGB: 8.6 g/dL — ABNORMAL LOW (ref 11.6–15.9)
MCH: 29.2 pg (ref 25.1–34.0)
MCV: 88.8 fL (ref 79.5–101.0)
MONO%: 26.2 % — ABNORMAL HIGH (ref 0.0–14.0)
NEUT%: 13.6 % — ABNORMAL LOW (ref 38.4–76.8)
RDW: 20.8 % — ABNORMAL HIGH (ref 11.2–14.5)

## 2013-06-04 MED ORDER — FILGRASTIM 480 MCG/0.8ML IJ SOLN
480.0000 ug | Freq: Once | INTRAMUSCULAR | Status: AC
  Administered 2013-06-04: 480 ug via SUBCUTANEOUS
  Filled 2013-06-04: qty 0.8

## 2013-06-04 NOTE — Telephone Encounter (Signed)
appts made and printed. Pt is aware tha tx will be added. i emailed MW to add the tx....td

## 2013-06-04 NOTE — Patient Instructions (Addendum)
  Patient Neutropenia Instruction Sheet  Diagnosis: Breast Cancer      Treating Physician: Drue Second, MD  Treatment: 1. Type of chemotherapy: taxol carbo  2. Date of last treatment: 05/29/13  Last Blood Counts: Lab Results  Component Value Date   WBC 1.9* 06/04/2013   HGB 8.6* 06/04/2013   HCT 26.2* 06/04/2013   MCV 88.8 06/04/2013   PLT 72* 06/04/2013   ANC 300     Prophylactic Antibiotics: Cipro 500 mg by mouth twice a day Instructions: 1. Monitor temperature and call if fever  greater than 100.5, chills, shaking chills (rigors) 2. Call Physician on-call at [phone] 3. Give him/her symptoms and list of medications that you are taking and your last blood count.

## 2013-06-04 NOTE — Progress Notes (Signed)
OFFICE PROGRESS NOTE  CC**  [name],[name], MD [address], [address] [place] [address]  DIAGNOSIS: 57 year old female with stage IIA ER weakly positive, PR negative HER-2/neu negative, invasive ductal carcinoma of the left breast.   PRIOR THERAPY: 1. Earlier this year it on routine screening mammography the patient was noted to have a abnormality within the 12:00 position of the left breast. Patient returned for additional imaging with 3-[name], tomosynthesis. Within the left breast there were 2 masses somewhat lobulated with irregular margins in the upper inner aspect of the left breast. These were separated by approximately a centimeter. On ultrasound the lesion the 11:00 position measured 1.0 x 0.7 cm. The 12:00 lesion measured 1.6 x 0.9 cm. In addition within the axillary region there was a 2.1 x 1.7 suspicious appearing lymph node.. The patient proceeded to undergo ultrasound-guided biopsy of the 2 areas within the left breast as well as the suspicious left axillary lymph node. Biopsies from both breast areas revealed invasive ductal carcinoma. In addition the lymph node from the left axilla revealed ductal carcinoma. The lesions appeared to be high-grade. There was no HER-2/neu amplification and hormone staining was minimal consistent with essentially consistent with triple negative breast cancer.  Patient's case was discussed at the multidisciplinary breast conference her pathology and radiology were reviewed.  2. Patient underwent lumpectomy of the left breast with Dr. Donell Beers on 02/04/13, a 1.6cm tumor was removed along with an axillary lymph node dissection that found 1/19 nodes positive for metastatic disease.    3. Patient was then started on adjuvant chemotherapy on 03/05/13 with Adriamycin/Cytoxan. She began Taxol Carbo on 05/07/13  CURRENT THERAPY: Taxol Carbo week 5  INTERVAL HISTORY: Kristi [name] 57 y.o. female returns for evaluation prior to Taxol Carbo.  She's doing well  this week.  She denies fevers, chills, nausea, vomiting, constipation, diarrhea, numbness, or any further concerns.  Shes unfortunately neutropenic and thrombocytopenic.  She also has peeling on the soles of her feet.  Otherwise, a 10 point ROS is neg.   MEDICAL HISTORY: Past Medical History  Diagnosis Date  . Anxiety   . GERD (gastroesophageal reflux disease)   . Hypertension   . Breast cancer   . MRSA (methicillin resistant Staphylococcus aureus) 2009    ALLERGIES:  has No Known Allergies.  MEDICATIONS:  Current Outpatient Prescriptions  Medication Sig Dispense Refill  . ALPRAZolam (XANAX) 1 MG tablet Take 2 mg by mouth at bedtime as needed for sleep.       Marland Kitchen amphetamine-dextroamphetamine (ADDERALL) 10 MG tablet Take 20 mg by mouth daily.       . B Complex-C (SUPER B COMPLEX PO) Take 1 tablet by mouth daily.      . ciprofloxacin (CIPRO) 500 MG tablet Take 1 tablet (500 mg total) by mouth 2 (two) times daily.  14 tablet  6  . COMPRO 25 MG suppository       . dexamethasone (DECADRON) 4 MG tablet       . dexamethasone (DECADRON) 4 MG tablet Take 2 tablets (8 mg total) by mouth 2 (two) times daily with a meal. Take two times a day starting the day after chemotherapy for 3 days.  30 tablet  1  . escitalopram (LEXAPRO) 20 MG tablet Take 40 mg by mouth daily.       Marland Kitchen esomeprazole (NEXIUM) 40 MG capsule Take 40 mg by mouth daily as needed. For reflux      . lidocaine-prilocaine (EMLA) cream  Apply topically as needed.  30 g  7  . LORazepam (ATIVAN) 0.5 MG tablet Take 1 tablet (0.5 mg total) by mouth every 6 (six) hours as needed for anxiety.  30 tablet  0  . LORazepam (ATIVAN) 0.5 MG tablet Take 1 tablet (0.5 mg total) by mouth every 6 (six) hours as needed (Nausea or vomiting).  30 tablet  0  . Multiple Vitamin (MULTIVITAMIN) tablet Take 1 tablet by mouth daily.      . Omega-3 Fatty Acids (FISH OIL PO) Take 1 capsule by mouth daily.      . ondansetron (ZOFRAN) 8 MG tablet       .  ondansetron (ZOFRAN) 8 MG tablet Take 1 tablet (8 mg total) by mouth 2 (two) times daily. Take two times a day starting the day after chemo for 3 days. Then take two times a day as needed for nausea or vomiting.  30 tablet  1  . OVER THE COUNTER MEDICATION Take by mouth as needed.      Marland Kitchen OVER THE COUNTER MEDICATION Take by mouth as needed. OTC stool softner, as needed      . potassium chloride SA (K-DUR,KLOR-CON) 20 MEQ tablet Take 1 tablet (20 mEq total) by mouth 2 (two) times daily.  60 tablet  0  . prochlorperazine (COMPAZINE) 10 MG tablet       . prochlorperazine (COMPAZINE) 10 MG tablet Take 1 tablet (10 mg total) by mouth every 6 (six) hours as needed (Nausea or vomiting).  30 tablet  1  . prochlorperazine (COMPAZINE) 25 MG suppository Place 1 suppository (25 mg total) rectally every 12 (twelve) hours as needed for nausea.  12 suppository  3  . valsartan-hydrochlorothiazide (DIOVAN-HCT) 320-25 MG per tablet Take 1 tablet by mouth daily.        Current Facility-Administered Medications  Medication Dose Route Frequency Provider Last Rate Last Dose  . filgrastim (NEUPOGEN) injection 480 mcg  480 mcg Subcutaneous Once Augustin Schooling, NP        SURGICAL HISTORY:  Past Surgical History  Procedure Laterality Date  . Anal sphincterotomy  04/2011  . Hemorrhoid surgery  04/2011    ligation  . Breast lumpectomy with needle localization Left 02/04/2013    Procedure: LEFT BREAST LUMPECTOMY WITH NEEDLE LOCALIZATION;  Surgeon: Almond Lint, MD;  Location: [facility] OR;  Service: General;  Laterality: Left;  . Axillary lymph node dissection Left 02/04/2013    Procedure: LEFT AXILLARY LYMPH NODE DISSECTION;  Surgeon: Almond Lint, MD;  Location: [facility] OR;  Service: General;  Laterality: Left;  End: 1512  . Portacath placement Right 02/04/2013    Procedure: INSERTION PORT-A-CATH;  Surgeon: Almond Lint, MD;  Location: [facility] OR;  Service: General;  Laterality: Right;  Start Time: 1610.  Marland Kitchen Appendectomy  1980    REVIEW  OF SYSTEMS:   General: fatigue (-), night sweats (-), fever (-), pain (-) Lymph: palpable nodes (-) HEENT: vision changes (-), mucositis (-), gum bleeding (-), epistaxis (-) Cardiovascular: chest pain (-), palpitations (-) Pulmonary: shortness of breath (-), dyspnea on exertion (-), cough (-), hemoptysis (-) GI:  Early satiety (-), melena (-), dysphagia (-), nausea/vomiting (-), diarrhea (-) GU: dysuria (-), hematuria (-), incontinence (-) Musculoskeletal: joint swelling (-), joint pain (-), back pain (-) Neuro: weakness (-), numbness (-), headache (-), confusion (-) Skin: Rash (-), lesions (-), dryness (-) Psych: depression (-), suicidal/homicidal ideation (-), feeling of hopelessness (-)   PHYSICAL EXAMINATION: Blood pressure 109/71, pulse 88, temperature 98.4 F (36.9  C), temperature source Oral, resp. rate 20, height 5\' 2"  (1.575 m), weight 147 lb 8 oz (66.906 kg). Body mass index is 26.97 kg/(m^2). General: Patient is a well appearing female in no acute distress HEENT: PERRLA, sclerae anicteric no conjunctival pallor, MMM Neck: supple, no palpable adenopathy Lungs: clear to auscultation bilaterally, no wheezes, rhonchi, or rales Cardiovascular: regular rate rhythm, S1, S2, no murmurs, rubs or gallops Abdomen: Soft, non-tender, non-distended, normoactive bowel sounds, no HSM Extremities: warm and well perfused, no clubbing, cyanosis, or edema Skin: No rashes or lesions Neuro: Non-focal Breasts: left breast lumpectomy site no nodularity, healing well, scar tissue underneath axillary incision, no erythema, swelling, drainage, right breast no masses or nodularity.  ECOG PERFORMANCE STATUS: 1 - Symptomatic but completely ambulatory  LABORATORY DATA: Lab Results  Component Value Date   WBC 1.9* 06/04/2013   HGB 8.6* 06/04/2013   HCT 26.2* 06/04/2013   MCV 88.8 06/04/2013   PLT 72* 06/04/2013      Chemistry      Component Value Date/Time   NA 141 05/29/2013 1256   NA 137 02/05/2013  0505   K 3.9 05/29/2013 1256   K 3.4* 02/05/2013 0505   CL 100 04/23/2013 1447   CL 102 02/05/2013 0505   CO2 27 05/29/2013 1256   CO2 27 02/05/2013 0505   BUN 10.4 05/29/2013 1256   BUN 8 02/05/2013 0505   CREATININE 0.7 05/29/2013 1256   CREATININE 0.86 02/05/2013 0505      Component Value Date/Time   CALCIUM 10.2 05/29/2013 1256   CALCIUM 8.0* 02/05/2013 0505   ALKPHOS 45 05/29/2013 1256   AST 25 05/29/2013 1256   ALT 35 05/29/2013 1256   BILITOT 0.35 05/29/2013 1256       RADIOGRAPHIC STUDIES:  No results found.  ASSESSMENT: 57 year old female with new diagnosis of   #1 multifocal invasive ductal carcinoma grade 3 ER weakly positive at 3% PR negative HER-2/neu negative with Ki-67 of 90% tumor is very aggressive. She does have a palpable lymph node as well in the left axilla. Patient was seen in the multidisciplinary clinic for discussion of treatment options.   #2 patient underwent a lumpectomy with axillary lymph node dissection with Dr. Luz Brazen on 02/04/13. A 1.6 cm tumor was removed with 1/19 lymph nodes positive for disease.    #3 She was recommended adjuvant chemotherapy consisting of Adriamycin Cytoxan given dose dense for total of 4 cycles followed by Taxol carboplatinum weekly for 12 weeks. Rationale for this aggressive treatment was discussed with the patient since she is essentially has a triple-negative disease.   #4 certainly patient will need post lumpectomy radiation therapy she was seen by radiation oncology. This will begin after patient has completed her chemotherapy.   #5 we discussed role of genetics, and she had an appointment with Maylon Cos on 03/06/13 the results were negative.       PLAN:  1. Doing well. She will not receive chemotherapy this week due to her neutropenia and thrombocytopenia.  She will receive more neupogen x 7 days in addition to Cipro prophylaxis.  She was given neutropenic instructions and they were reviewed with her and her daughter in detail.    #2  The peeling on the soles of the feet is very mild and we will monitor this.  She's using Aquaphor BID and it is helping.  3. We will see Ms. Mccasland back in one week for cycle 5 Taxol/Carbo.  All questions were answered. The patient knows to  call the clinic with any problems, questions or concerns. We can certainly see the patient much sooner if necessary.  I spent 25 minutes counseling the patient face to face. The total time spent in the appointment was 30 minutes.  Cherie Ouch Lyn Hollingshead, NP Medical [facility] Phone: [phone] 06/04/2013, 1:41 PM

## 2013-06-04 NOTE — Progress Notes (Signed)
Faxed fmla form to [employer] @ [phone] °

## 2013-06-05 ENCOUNTER — Ambulatory Visit (HOSPITAL_BASED_OUTPATIENT_CLINIC_OR_DEPARTMENT_OTHER): Payer: BC Managed Care – PPO

## 2013-06-05 ENCOUNTER — Ambulatory Visit: Payer: BC Managed Care – PPO

## 2013-06-05 VITALS — BP 106/61 | HR 99 | Temp 98.4°F

## 2013-06-05 DIAGNOSIS — C50219 Malignant neoplasm of upper-inner quadrant of unspecified female breast: Secondary | ICD-10-CM

## 2013-06-05 DIAGNOSIS — Z5189 Encounter for other specified aftercare: Secondary | ICD-10-CM

## 2013-06-05 MED ORDER — FILGRASTIM 480 MCG/0.8ML IJ SOLN
480.0000 ug | Freq: Once | INTRAMUSCULAR | Status: AC
  Administered 2013-06-05: 480 ug via SUBCUTANEOUS
  Filled 2013-06-05: qty 0.8

## 2013-06-06 ENCOUNTER — Ambulatory Visit (HOSPITAL_BASED_OUTPATIENT_CLINIC_OR_DEPARTMENT_OTHER): Payer: BC Managed Care – PPO

## 2013-06-06 VITALS — BP 122/61 | HR 101 | Temp 98.1°F

## 2013-06-06 DIAGNOSIS — Z5189 Encounter for other specified aftercare: Secondary | ICD-10-CM

## 2013-06-06 DIAGNOSIS — C50219 Malignant neoplasm of upper-inner quadrant of unspecified female breast: Secondary | ICD-10-CM

## 2013-06-06 MED ORDER — FILGRASTIM 480 MCG/0.8ML IJ SOLN
480.0000 ug | Freq: Once | INTRAMUSCULAR | Status: AC
  Administered 2013-06-06: 480 ug via SUBCUTANEOUS
  Filled 2013-06-06: qty 0.8

## 2013-06-07 ENCOUNTER — Ambulatory Visit (HOSPITAL_BASED_OUTPATIENT_CLINIC_OR_DEPARTMENT_OTHER): Payer: BC Managed Care – PPO

## 2013-06-07 VITALS — BP 116/64 | HR 107 | Temp 98.0°F

## 2013-06-07 DIAGNOSIS — C50219 Malignant neoplasm of upper-inner quadrant of unspecified female breast: Secondary | ICD-10-CM

## 2013-06-07 DIAGNOSIS — Z5189 Encounter for other specified aftercare: Secondary | ICD-10-CM

## 2013-06-07 MED ORDER — FILGRASTIM 480 MCG/0.8ML IJ SOLN
480.0000 ug | Freq: Once | INTRAMUSCULAR | Status: AC
  Administered 2013-06-07: 480 ug via SUBCUTANEOUS

## 2013-06-07 NOTE — Patient Instructions (Signed)
Filgrastim, G-CSF injection What is this medicine? FILGRASTIM, G-CSF (fil GRA stim) stimulates the formation of white blood cells. This medicine is given to patients with conditions that may cause a decrease in white blood cells, like those receiving certain types of chemotherapy or bone marrow transplant. It helps the bone marrow recover its ability to produce white blood cells. Increasing the amount of white blood cells helps to decrease the risk of infection and fever. This medicine may be used for other purposes; ask your health care provider or pharmacist if you have questions. What should I tell my health care provider before I take this medicine? They need to know if you have any of these conditions: -currently receiving radiation therapy -sickle cell disease -an unusual or allergic reaction to filgrastim, E. coli protein, other medicines, foods, dyes, or preservatives -pregnant or trying to get pregnant -breast-feeding How should I use this medicine? This medicine is for injection into a vein or injection under the skin. It is usually given by a health care professional in a hospital or clinic setting. If you get this medicine at home, you will be taught how to prepare and give this medicine. Always change the site for the injection under the skin. Let the solution warm to room temperature before you use it. Do not shake the solution before you withdraw a dose. Throw away any unused portion. Use exactly as directed. Take your medicine at regular intervals. Do not take your medicine more often than directed. It is important that you put your used needles and syringes in a special sharps container. Do not put them in a trash can. If you do not have a sharps container, call your pharmacist or healthcare provider to get one. Talk to your pediatrician regarding the use of this medicine in children. While this medicine may be prescribed for children for selected conditions, precautions do  apply. Overdosage: If you think you have taken too much of this medicine contact a poison control center or emergency room at once. NOTE: This medicine is only for you. Do not share this medicine with others. What if I miss a dose? Try not to miss doses. If you miss a dose take the dose as soon as you remember. If it is almost time for the next dose, do not take double doses unless told to by your doctor or health care professional. What may interact with this medicine? -lithium -medicines for cancer chemotherapy This list may not describe all possible interactions. Give your health care provider a list of all the medicines, herbs, non-prescription drugs, or dietary supplements you use. Also tell them if you smoke, drink alcohol, or use illegal drugs. Some items may interact with your medicine. What should I watch for while using this medicine? Visit your doctor or health care professional for regular checks on your progress. If you get a fever or any sign of infection while you are using this medicine, do not treat yourself. Check with your doctor or health care professional. Bone pain can usually be relieved by mild pain relievers such as acetaminophen or ibuprofen. Check with your doctor or health care professional before taking these medicines as they may hide a fever. Call your doctor or health care professional if the aches and pains are severe or do not go away. What side effects may I notice from receiving this medicine? Side effects that you should report to your doctor or health care professional as soon as possible: -allergic reactions like skin rash, itching   or hives, swelling of the face, lips, or tongue -difficulty breathing, wheezing -fever -pain, redness, or swelling at the injection site -stomach or side pain, or pain at the shoulder Side effects that usually do not require medical attention (report to your doctor or health care professional if they continue or are  bothersome): -bone pain (ribs, lower back, breast bone) -headache -skin rash This list may not describe all possible side effects. Call your doctor for medical advice about side effects. You may report side effects to FDA at 1-800-FDA-1088. Where should I keep my medicine? Keep out of the reach of children. Store in a refrigerator between 2 and 8 degrees C (36 and 46 degrees F). Do not freeze or leave in direct sunlight. If vials or syringes are left out of the refrigerator for more than 24 hours, they must be thrown away. Throw away unused vials after the expiration date on the carton. NOTE: This sheet is a summary. It may not cover all possible information. If you have questions about this medicine, talk to your doctor, pharmacist, or health care provider.  2013, [employer]. (01/08/2008 1:33:21 PM)  

## 2013-06-09 ENCOUNTER — Ambulatory Visit (HOSPITAL_BASED_OUTPATIENT_CLINIC_OR_DEPARTMENT_OTHER): Payer: BC Managed Care – PPO

## 2013-06-09 VITALS — BP 114/64 | HR 81 | Temp 97.9°F

## 2013-06-09 DIAGNOSIS — Z5189 Encounter for other specified aftercare: Secondary | ICD-10-CM

## 2013-06-09 DIAGNOSIS — C50219 Malignant neoplasm of upper-inner quadrant of unspecified female breast: Secondary | ICD-10-CM

## 2013-06-09 MED ORDER — FILGRASTIM 480 MCG/0.8ML IJ SOLN
480.0000 ug | Freq: Once | INTRAMUSCULAR | Status: AC
  Administered 2013-06-09: 480 ug via SUBCUTANEOUS
  Filled 2013-06-09: qty 0.8

## 2013-06-10 ENCOUNTER — Ambulatory Visit: Payer: BC Managed Care – PPO | Attending: General Surgery | Admitting: Physical Therapy

## 2013-06-10 ENCOUNTER — Ambulatory Visit (HOSPITAL_BASED_OUTPATIENT_CLINIC_OR_DEPARTMENT_OTHER): Payer: BC Managed Care – PPO

## 2013-06-10 VITALS — BP 97/86 | HR 71 | Temp 98.1°F

## 2013-06-10 DIAGNOSIS — IMO0001 Reserved for inherently not codable concepts without codable children: Secondary | ICD-10-CM | POA: Insufficient documentation

## 2013-06-10 DIAGNOSIS — Z5189 Encounter for other specified aftercare: Secondary | ICD-10-CM

## 2013-06-10 DIAGNOSIS — C50219 Malignant neoplasm of upper-inner quadrant of unspecified female breast: Secondary | ICD-10-CM

## 2013-06-10 DIAGNOSIS — M24519 Contracture, unspecified shoulder: Secondary | ICD-10-CM | POA: Insufficient documentation

## 2013-06-10 DIAGNOSIS — M25519 Pain in unspecified shoulder: Secondary | ICD-10-CM | POA: Insufficient documentation

## 2013-06-10 DIAGNOSIS — C50919 Malignant neoplasm of unspecified site of unspecified female breast: Secondary | ICD-10-CM | POA: Insufficient documentation

## 2013-06-10 MED ORDER — FILGRASTIM 480 MCG/0.8ML IJ SOLN
480.0000 ug | Freq: Once | INTRAMUSCULAR | Status: AC
  Administered 2013-06-10: 480 ug via SUBCUTANEOUS
  Filled 2013-06-10: qty 0.8

## 2013-06-11 ENCOUNTER — Ambulatory Visit (HOSPITAL_BASED_OUTPATIENT_CLINIC_OR_DEPARTMENT_OTHER): Payer: BC Managed Care – PPO

## 2013-06-11 ENCOUNTER — Telehealth: Payer: Self-pay | Admitting: Medical Oncology

## 2013-06-11 ENCOUNTER — Ambulatory Visit: Payer: BC Managed Care – PPO | Admitting: Adult Health

## 2013-06-11 ENCOUNTER — Other Ambulatory Visit: Payer: BC Managed Care – PPO | Admitting: Lab

## 2013-06-11 ENCOUNTER — Ambulatory Visit: Payer: BC Managed Care – PPO

## 2013-06-11 VITALS — BP 105/54 | HR 83 | Temp 98.2°F

## 2013-06-11 DIAGNOSIS — C50219 Malignant neoplasm of upper-inner quadrant of unspecified female breast: Secondary | ICD-10-CM

## 2013-06-11 DIAGNOSIS — Z5189 Encounter for other specified aftercare: Secondary | ICD-10-CM

## 2013-06-11 MED ORDER — FILGRASTIM 480 MCG/0.8ML IJ SOLN
480.0000 ug | Freq: Once | INTRAMUSCULAR | Status: AC
  Administered 2013-06-11: 480 ug via SUBCUTANEOUS
  Filled 2013-06-11: qty 0.8

## 2013-06-11 NOTE — Telephone Encounter (Signed)
Daughter, Kristi [name], called concerned about her mother's behavior states " she's been acting out of character, kind of foggy, and other people have noticed a change in her behavior. Could this be a side effect to the injections she's been getting?" Pt receives neupogen injections. Reviewed concerns with NP, and informed daughter this is not a s/e to the injections. Advised daughter to attend appt with pt tomorrow 08/07 to discuss further concerns. Daughter states she will be with pt, expressed thanks. Denies further questions.

## 2013-06-12 ENCOUNTER — Other Ambulatory Visit (HOSPITAL_BASED_OUTPATIENT_CLINIC_OR_DEPARTMENT_OTHER): Payer: BC Managed Care – PPO | Admitting: Lab

## 2013-06-12 ENCOUNTER — Encounter: Payer: Self-pay | Admitting: Adult Health

## 2013-06-12 ENCOUNTER — Ambulatory Visit (HOSPITAL_BASED_OUTPATIENT_CLINIC_OR_DEPARTMENT_OTHER): Payer: BC Managed Care – PPO

## 2013-06-12 ENCOUNTER — Ambulatory Visit (HOSPITAL_BASED_OUTPATIENT_CLINIC_OR_DEPARTMENT_OTHER): Payer: BC Managed Care – PPO | Admitting: Adult Health

## 2013-06-12 VITALS — BP 120/73 | HR 85 | Temp 98.2°F | Resp 20 | Ht 62.0 in | Wt 149.7 lb

## 2013-06-12 DIAGNOSIS — Z170 Estrogen receptor positive status [ER+]: Secondary | ICD-10-CM

## 2013-06-12 DIAGNOSIS — C773 Secondary and unspecified malignant neoplasm of axilla and upper limb lymph nodes: Secondary | ICD-10-CM

## 2013-06-12 DIAGNOSIS — C50212 Malignant neoplasm of upper-inner quadrant of left female breast: Secondary | ICD-10-CM

## 2013-06-12 DIAGNOSIS — C50219 Malignant neoplasm of upper-inner quadrant of unspecified female breast: Secondary | ICD-10-CM

## 2013-06-12 DIAGNOSIS — C50912 Malignant neoplasm of unspecified site of left female breast: Secondary | ICD-10-CM

## 2013-06-12 DIAGNOSIS — Z5111 Encounter for antineoplastic chemotherapy: Secondary | ICD-10-CM

## 2013-06-12 LAB — CBC WITH DIFFERENTIAL/PLATELET
BASO%: 0.1 % (ref 0.0–2.0)
EOS%: 0.1 % (ref 0.0–7.0)
HCT: 27.4 % — ABNORMAL LOW (ref 34.8–46.6)
LYMPH%: 12.9 % — ABNORMAL LOW (ref 14.0–49.7)
MCH: 29.4 pg (ref 25.1–34.0)
MCHC: 32.1 g/dL (ref 31.5–36.0)
MCV: 91.6 fL (ref 79.5–101.0)
MONO%: 5.4 % (ref 0.0–14.0)
NEUT%: 81.5 % — ABNORMAL HIGH (ref 38.4–76.8)
lymph#: 1.8 10*3/uL (ref 0.9–3.3)

## 2013-06-12 LAB — COMPREHENSIVE METABOLIC PANEL (CC13)
AST: 25 U/L (ref 5–34)
Alkaline Phosphatase: 96 U/L (ref 40–150)
BUN: 9 mg/dL (ref 7.0–26.0)
Creatinine: 0.7 mg/dL (ref 0.6–1.1)
Potassium: 3.7 mEq/L (ref 3.5–5.1)
Total Bilirubin: 0.3 mg/dL (ref 0.20–1.20)

## 2013-06-12 MED ORDER — HEPARIN SOD (PORK) LOCK FLUSH 100 UNIT/ML IV SOLN
500.0000 [IU] | Freq: Once | INTRAVENOUS | Status: AC | PRN
  Administered 2013-06-12: 500 [IU]
  Filled 2013-06-12: qty 5

## 2013-06-12 MED ORDER — FAMOTIDINE IN NACL 20-0.9 MG/50ML-% IV SOLN
20.0000 mg | Freq: Once | INTRAVENOUS | Status: AC
  Administered 2013-06-12: 20 mg via INTRAVENOUS

## 2013-06-12 MED ORDER — PACLITAXEL CHEMO INJECTION 300 MG/50ML
80.0000 mg/m2 | Freq: Once | INTRAVENOUS | Status: AC
  Administered 2013-06-12: 138 mg via INTRAVENOUS
  Filled 2013-06-12: qty 23

## 2013-06-12 MED ORDER — ONDANSETRON 16 MG/50ML IVPB (CHCC)
16.0000 mg | Freq: Once | INTRAVENOUS | Status: AC
  Administered 2013-06-12: 16 mg via INTRAVENOUS

## 2013-06-12 MED ORDER — DEXAMETHASONE SODIUM PHOSPHATE 20 MG/5ML IJ SOLN
20.0000 mg | Freq: Once | INTRAMUSCULAR | Status: AC
  Administered 2013-06-12: 20 mg via INTRAVENOUS

## 2013-06-12 MED ORDER — SODIUM CHLORIDE 0.9 % IV SOLN
243.4000 mg | Freq: Once | INTRAVENOUS | Status: AC
  Administered 2013-06-12: 240 mg via INTRAVENOUS
  Filled 2013-06-12: qty 24

## 2013-06-12 MED ORDER — SODIUM CHLORIDE 0.9 % IV SOLN
Freq: Once | INTRAVENOUS | Status: AC
  Administered 2013-06-12: 15:00:00 via INTRAVENOUS

## 2013-06-12 MED ORDER — SODIUM CHLORIDE 0.9 % IJ SOLN
10.0000 mL | INTRAMUSCULAR | Status: DC | PRN
  Administered 2013-06-12: 10 mL
  Filled 2013-06-12: qty 10

## 2013-06-12 MED ORDER — DIPHENHYDRAMINE HCL 50 MG/ML IJ SOLN
50.0000 mg | Freq: Once | INTRAMUSCULAR | Status: AC
  Administered 2013-06-12: 50 mg via INTRAVENOUS

## 2013-06-12 NOTE — Patient Instructions (Addendum)
[facility] Discharge Instructions for Patients Receiving Chemotherapy  Today you received the following chemotherapy agents Taxol/Carboplatin To help prevent nausea and vomiting after your treatment, we encourage you to take your nausea medication as prescribed.  If you develop nausea and vomiting that is not controlled by your nausea medication, call the clinic.   BELOW ARE SYMPTOMS THAT SHOULD BE REPORTED IMMEDIATELY:  *FEVER GREATER THAN 100.5 F  *CHILLS WITH OR WITHOUT FEVER  NAUSEA AND VOMITING THAT IS NOT CONTROLLED WITH YOUR NAUSEA MEDICATION  *UNUSUAL SHORTNESS OF BREATH  *UNUSUAL BRUISING OR BLEEDING  TENDERNESS IN MOUTH AND THROAT WITH OR WITHOUT PRESENCE OF ULCERS  *URINARY PROBLEMS  *BOWEL PROBLEMS  UNUSUAL RASH Items with * indicate a potential emergency and should be followed up as soon as possible.  Feel free to call the clinic you have any questions or concerns. The clinic phone number is [phone].    

## 2013-06-12 NOTE — Progress Notes (Signed)
OFFICE PROGRESS NOTE  CC**  [name],[name], MD [address] [place] [address]  DIAGNOSIS: 57 year old female with stage IIA ER weakly positive, PR negative HER-2/neu negative, invasive ductal carcinoma of the left breast.   PRIOR THERAPY: 1. Earlier this year it on routine screening mammography the patient was noted to have a abnormality within the 12:00 position of the left breast. Patient returned for additional imaging with 3-[name], tomosynthesis. Within the left breast there were 2 masses somewhat lobulated with irregular margins in the upper inner aspect of the left breast. These were separated by approximately a centimeter. On ultrasound the lesion the 11:00 position measured 1.0 x 0.7 cm. The 12:00 lesion measured 1.6 x 0.9 cm. In addition within the axillary region there was a 2.1 x 1.7 suspicious appearing lymph node.. The patient proceeded to undergo ultrasound-guided biopsy of the 2 areas within the left breast as well as the suspicious left axillary lymph node. Biopsies from both breast areas revealed invasive ductal carcinoma. In addition the lymph node from the left axilla revealed ductal carcinoma. The lesions appeared to be high-grade. There was no HER-2/neu amplification and hormone staining was minimal consistent with essentially consistent with triple negative breast cancer.  Patient's case was discussed at the multidisciplinary breast conference her pathology and radiology were reviewed.  2. Patient underwent lumpectomy of the left breast with Dr. Donell Beers on 02/04/13, a 1.6cm tumor was removed along with an axillary lymph node dissection that found 1/19 nodes positive for metastatic disease.    3. Patient was then started on adjuvant chemotherapy on 03/05/13 with Adriamycin/Cytoxan. She began Taxol Carbo on 05/07/13  CURRENT THERAPY: Taxol Carbo week 5  INTERVAL HISTORY: Kristi [name] 57 y.o. female returns for evaluation prior to Taxol Carbo.  She's doing well  this week.  She denies fevers, chills, nausea, vomiting, constipation, diarrhea, numbness, or any further concerns.  She also has peeling on the soles of her feet that is the same and she continues to use aquaphor on it.  Otherwise, a 10 point ROS is neg.   MEDICAL HISTORY: Past Medical History  Diagnosis Date  . Anxiety   . GERD (gastroesophageal reflux disease)   . Hypertension   . Breast cancer   . MRSA (methicillin resistant Staphylococcus aureus) 2009    ALLERGIES:  has No Known Allergies.  MEDICATIONS:  Current Outpatient Prescriptions  Medication Sig Dispense Refill  . ALPRAZolam (XANAX) 1 MG tablet Take 2 mg by mouth at bedtime as needed for sleep.       Marland Kitchen amphetamine-dextroamphetamine (ADDERALL) 10 MG tablet Take 20 mg by mouth daily.       . B Complex-C (SUPER B COMPLEX PO) Take 1 tablet by mouth daily.      . ciprofloxacin (CIPRO) 500 MG tablet Take 1 tablet (500 mg total) by mouth 2 (two) times daily.  14 tablet  6  . COMPRO 25 MG suppository       . dexamethasone (DECADRON) 4 MG tablet       . dexamethasone (DECADRON) 4 MG tablet Take 2 tablets (8 mg total) by mouth 2 (two) times daily with a meal. Take two times a day starting the day after chemotherapy for 3 days.  30 tablet  1  . escitalopram (LEXAPRO) 20 MG tablet Take 40 mg by mouth daily.       Marland Kitchen esomeprazole (NEXIUM) 40 MG capsule Take 40 mg by mouth daily as needed. For reflux      .  lidocaine-prilocaine (EMLA) cream Apply topically as needed.  30 g  7  . LORazepam (ATIVAN) 0.5 MG tablet Take 1 tablet (0.5 mg total) by mouth every 6 (six) hours as needed for anxiety.  30 tablet  0  . LORazepam (ATIVAN) 0.5 MG tablet Take 1 tablet (0.5 mg total) by mouth every 6 (six) hours as needed (Nausea or vomiting).  30 tablet  0  . Multiple Vitamin (MULTIVITAMIN) tablet Take 1 tablet by mouth daily.      . Omega-3 Fatty Acids (FISH OIL PO) Take 1 capsule by mouth daily.      . ondansetron (ZOFRAN) 8 MG tablet       .  ondansetron (ZOFRAN) 8 MG tablet Take 1 tablet (8 mg total) by mouth 2 (two) times daily. Take two times a day starting the day after chemo for 3 days. Then take two times a day as needed for nausea or vomiting.  30 tablet  1  . OVER THE COUNTER MEDICATION Take by mouth as needed.      Marland Kitchen OVER THE COUNTER MEDICATION Take by mouth as needed. OTC stool softner, as needed      . potassium chloride SA (K-DUR,KLOR-CON) 20 MEQ tablet Take 1 tablet (20 mEq total) by mouth 2 (two) times daily.  60 tablet  0  . prochlorperazine (COMPAZINE) 10 MG tablet       . prochlorperazine (COMPAZINE) 10 MG tablet Take 1 tablet (10 mg total) by mouth every 6 (six) hours as needed (Nausea or vomiting).  30 tablet  1  . prochlorperazine (COMPAZINE) 25 MG suppository Place 1 suppository (25 mg total) rectally every 12 (twelve) hours as needed for nausea.  12 suppository  3  . valsartan-hydrochlorothiazide (DIOVAN-HCT) 320-25 MG per tablet Take 1 tablet by mouth daily.        No current facility-administered medications for this visit.    SURGICAL HISTORY:  Past Surgical History  Procedure Laterality Date  . Anal sphincterotomy  04/2011  . Hemorrhoid surgery  04/2011    ligation  . Breast lumpectomy with needle localization Left 02/04/2013    Procedure: LEFT BREAST LUMPECTOMY WITH NEEDLE LOCALIZATION;  Surgeon: Almond Lint, MD;  Location: [facility] OR;  Service: General;  Laterality: Left;  . Axillary lymph node dissection Left 02/04/2013    Procedure: LEFT AXILLARY LYMPH NODE DISSECTION;  Surgeon: Almond Lint, MD;  Location: [facility] OR;  Service: General;  Laterality: Left;  End: 1512  . Portacath placement Right 02/04/2013    Procedure: INSERTION PORT-A-CATH;  Surgeon: Almond Lint, MD;  Location: [facility] OR;  Service: General;  Laterality: Right;  Start Time: 1610.  Marland Kitchen Appendectomy  1980    REVIEW OF SYSTEMS:   General: fatigue (-), night sweats (-), fever (-), pain (-) Lymph: palpable nodes (-) HEENT: vision changes (-), mucositis  (-), gum bleeding (-), epistaxis (-) Cardiovascular: chest pain (-), palpitations (-) Pulmonary: shortness of breath (-), dyspnea on exertion (-), cough (-), hemoptysis (-) GI:  Early satiety (-), melena (-), dysphagia (-), nausea/vomiting (-), diarrhea (-) GU: dysuria (-), hematuria (-), incontinence (-) Musculoskeletal: joint swelling (-), joint pain (-), back pain (-) Neuro: weakness (-), numbness (-), headache (-), confusion (-) Skin: Rash (-), lesions (-), dryness (-) Psych: depression (-), suicidal/homicidal ideation (-), feeling of hopelessness (-)   PHYSICAL EXAMINATION: Blood pressure 120/73, pulse 85, temperature 98.2 F (36.8 C), temperature source Oral, resp. rate 20, height 5\' 2"  (1.575 m), weight 149 lb 11.2 oz (67.903 kg). Body mass index  is 27.37 kg/(m^2). General: Patient is a well appearing female in no acute distress HEENT: PERRLA, sclerae anicteric no conjunctival pallor, MMM Neck: supple, no palpable adenopathy Lungs: clear to auscultation bilaterally, no wheezes, rhonchi, or rales Cardiovascular: regular rate rhythm, S1, S2, no murmurs, rubs or gallops Abdomen: Soft, non-tender, non-distended, normoactive bowel sounds, no HSM Extremities: warm and well perfused, no clubbing, cyanosis, or edema Skin: No rashes or lesions Neuro: Non-focal Breasts: left breast lumpectomy site no nodularity, healing well, scar tissue underneath axillary incision, no erythema, swelling, drainage, right breast no masses or nodularity.  ECOG PERFORMANCE STATUS: 1 - Symptomatic but completely ambulatory  LABORATORY DATA: Lab Results  Component Value Date   WBC 14.3* 06/12/2013   HGB 8.8* 06/12/2013   HCT 27.4* 06/12/2013   MCV 91.6 06/12/2013   PLT 106* 06/12/2013      Chemistry      Component Value Date/Time   NA 139 06/04/2013 1240   NA 137 02/05/2013 0505   K 3.6 06/04/2013 1240   K 3.4* 02/05/2013 0505   CL 100 04/23/2013 1447   CL 102 02/05/2013 0505   CO2 26 06/04/2013 1240   CO2 27  02/05/2013 0505   BUN 12.6 06/04/2013 1240   BUN 8 02/05/2013 0505   CREATININE 0.7 06/04/2013 1240   CREATININE 0.86 02/05/2013 0505      Component Value Date/Time   CALCIUM 10.1 06/04/2013 1240   CALCIUM 8.0* 02/05/2013 0505   ALKPHOS 45 06/04/2013 1240   AST 18 06/04/2013 1240   ALT 27 06/04/2013 1240   BILITOT 0.47 06/04/2013 1240       RADIOGRAPHIC STUDIES:  No results found.  ASSESSMENT: 57 year old female with new diagnosis of   #1 multifocal invasive ductal carcinoma grade 3 ER weakly positive at 3% PR negative HER-2/neu negative with Ki-67 of 90% tumor is very aggressive. She does have a palpable lymph node as well in the left axilla. Patient was seen in the multidisciplinary clinic for discussion of treatment options.   #2 patient underwent a lumpectomy with axillary lymph node dissection with Dr. Luz Brazen on 02/04/13. A 1.6 cm tumor was removed with 1/19 lymph nodes positive for disease.    #3 She was recommended adjuvant chemotherapy consisting of Adriamycin Cytoxan given dose dense for total of 4 cycles followed by Taxol carboplatinum weekly for 12 weeks. Rationale for this aggressive treatment was discussed with the patient since she is essentially has a triple-negative disease.   #4 certainly patient will need post lumpectomy radiation therapy she was seen by radiation oncology. This will begin after patient has completed her chemotherapy.   #5 we discussed role of genetics, and she had an appointment with Maylon Cos on 03/06/13 the results were negative.       PLAN:  1. Doing well. Labs have recovered, she will proceed with chemotherapy.      #2 The peeling on the soles of the feet continues to be very mild and we will monitor this.  She's using Aquaphor BID and it is helping.  3. We will see Ms. Mortellaro back in one week for cycle 6 Taxol/Carbo.  All questions were answered. The patient knows to call the clinic with any problems, questions or concerns. We can certainly see the  patient much sooner if necessary.  I spent 25 minutes counseling the patient face to face. The total time spent in the appointment was 30 minutes.  Cherie Ouch Lyn Hollingshead, NP Medical Oncology [facility] Phone: [phone]  06/12/2013, 2:19 PM

## 2013-06-12 NOTE — Patient Instructions (Addendum)
Doing well.  Proceed with chemotherapy.  Please call us if you have any questions or concerns.    

## 2013-06-18 ENCOUNTER — Other Ambulatory Visit: Payer: BC Managed Care – PPO | Admitting: Lab

## 2013-06-18 ENCOUNTER — Ambulatory Visit: Payer: BC Managed Care – PPO | Admitting: Adult Health

## 2013-06-18 ENCOUNTER — Ambulatory Visit: Payer: BC Managed Care – PPO

## 2013-06-19 ENCOUNTER — Ambulatory Visit (HOSPITAL_BASED_OUTPATIENT_CLINIC_OR_DEPARTMENT_OTHER): Payer: BC Managed Care – PPO

## 2013-06-19 ENCOUNTER — Other Ambulatory Visit (HOSPITAL_BASED_OUTPATIENT_CLINIC_OR_DEPARTMENT_OTHER): Payer: BC Managed Care – PPO | Admitting: Lab

## 2013-06-19 ENCOUNTER — Encounter: Payer: Self-pay | Admitting: Adult Health

## 2013-06-19 ENCOUNTER — Telehealth: Payer: Self-pay | Admitting: Oncology

## 2013-06-19 ENCOUNTER — Ambulatory Visit (HOSPITAL_BASED_OUTPATIENT_CLINIC_OR_DEPARTMENT_OTHER): Payer: BC Managed Care – PPO | Admitting: Adult Health

## 2013-06-19 VITALS — BP 114/69 | HR 69 | Temp 98.5°F | Resp 20 | Ht 62.0 in | Wt 147.5 lb

## 2013-06-19 DIAGNOSIS — C50219 Malignant neoplasm of upper-inner quadrant of unspecified female breast: Secondary | ICD-10-CM

## 2013-06-19 DIAGNOSIS — Z171 Estrogen receptor negative status [ER-]: Secondary | ICD-10-CM

## 2013-06-19 DIAGNOSIS — C50212 Malignant neoplasm of upper-inner quadrant of left female breast: Secondary | ICD-10-CM

## 2013-06-19 DIAGNOSIS — C50912 Malignant neoplasm of unspecified site of left female breast: Secondary | ICD-10-CM

## 2013-06-19 DIAGNOSIS — Z5111 Encounter for antineoplastic chemotherapy: Secondary | ICD-10-CM

## 2013-06-19 LAB — CBC WITH DIFFERENTIAL/PLATELET
BASO%: 0 % (ref 0.0–2.0)
Eosinophils Absolute: 0 10*3/uL (ref 0.0–0.5)
LYMPH%: 37.7 % (ref 14.0–49.7)
MCHC: 32 g/dL (ref 31.5–36.0)
MONO#: 0.2 10*3/uL (ref 0.1–0.9)
NEUT#: 1.7 10*3/uL (ref 1.5–6.5)
RBC: 2.75 10*6/uL — ABNORMAL LOW (ref 3.70–5.45)
RDW: 20 % — ABNORMAL HIGH (ref 11.2–14.5)
WBC: 3.1 10*3/uL — ABNORMAL LOW (ref 3.9–10.3)
lymph#: 1.2 10*3/uL (ref 0.9–3.3)
nRBC: 0 % (ref 0–0)

## 2013-06-19 LAB — COMPREHENSIVE METABOLIC PANEL (CC13)
ALT: 27 U/L (ref 0–55)
AST: 19 U/L (ref 5–34)
CO2: 26 mEq/L (ref 22–29)
Calcium: 9.9 mg/dL (ref 8.4–10.4)
Chloride: 105 mEq/L (ref 98–109)
Creatinine: 0.8 mg/dL (ref 0.6–1.1)
Potassium: 3.8 mEq/L (ref 3.5–5.1)
Sodium: 140 mEq/L (ref 136–145)
Total Protein: 6.3 g/dL — ABNORMAL LOW (ref 6.4–8.3)

## 2013-06-19 MED ORDER — PACLITAXEL CHEMO INJECTION 300 MG/50ML
80.0000 mg/m2 | Freq: Once | INTRAVENOUS | Status: AC
  Administered 2013-06-19: 138 mg via INTRAVENOUS
  Filled 2013-06-19: qty 23

## 2013-06-19 MED ORDER — SODIUM CHLORIDE 0.9 % IV SOLN
Freq: Once | INTRAVENOUS | Status: AC
  Administered 2013-06-19: 15:00:00 via INTRAVENOUS

## 2013-06-19 MED ORDER — SODIUM CHLORIDE 0.9 % IV SOLN
243.4000 mg | Freq: Once | INTRAVENOUS | Status: AC
  Administered 2013-06-19: 240 mg via INTRAVENOUS
  Filled 2013-06-19: qty 24

## 2013-06-19 MED ORDER — DIPHENHYDRAMINE HCL 50 MG/ML IJ SOLN
50.0000 mg | Freq: Once | INTRAMUSCULAR | Status: AC
  Administered 2013-06-19: 50 mg via INTRAVENOUS

## 2013-06-19 MED ORDER — SODIUM CHLORIDE 0.9 % IJ SOLN
10.0000 mL | INTRAMUSCULAR | Status: DC | PRN
  Administered 2013-06-19: 10 mL
  Filled 2013-06-19: qty 10

## 2013-06-19 MED ORDER — HEPARIN SOD (PORK) LOCK FLUSH 100 UNIT/ML IV SOLN
500.0000 [IU] | Freq: Once | INTRAVENOUS | Status: AC | PRN
  Administered 2013-06-19: 500 [IU]
  Filled 2013-06-19: qty 5

## 2013-06-19 MED ORDER — FAMOTIDINE IN NACL 20-0.9 MG/50ML-% IV SOLN
20.0000 mg | Freq: Once | INTRAVENOUS | Status: AC
  Administered 2013-06-19: 20 mg via INTRAVENOUS

## 2013-06-19 MED ORDER — DEXAMETHASONE SODIUM PHOSPHATE 20 MG/5ML IJ SOLN
20.0000 mg | Freq: Once | INTRAMUSCULAR | Status: AC
  Administered 2013-06-19: 20 mg via INTRAVENOUS

## 2013-06-19 MED ORDER — ONDANSETRON 16 MG/50ML IVPB (CHCC)
16.0000 mg | Freq: Once | INTRAVENOUS | Status: AC
  Administered 2013-06-19: 16 mg via INTRAVENOUS

## 2013-06-19 NOTE — Patient Instructions (Addendum)
Doing well.  Proceed with chemotherapy.  You will receive Neupogen x 5 days.  We will see you back in 1 week.  Please call us if you have any questions or concerns.

## 2013-06-19 NOTE — Telephone Encounter (Signed)
, °

## 2013-06-19 NOTE — Progress Notes (Signed)
Discharged at 51 with female friend, ambulating steady, no distress, friend carrying cane for her.

## 2013-06-19 NOTE — Progress Notes (Signed)
OFFICE PROGRESS NOTE  CC**  [name],[name], MD [address], [address] [place] [address]  DIAGNOSIS: 57 year old female with stage IIA ER weakly positive, PR negative HER-2/neu negative, invasive ductal carcinoma of the left breast.   PRIOR THERAPY: 1. Earlier this year it on routine screening mammography the patient was noted to have a abnormality within the 12:00 position of the left breast. Patient returned for additional imaging with 3-D, tomosynthesis. Within the left breast there were 2 masses somewhat lobulated with irregular margins in the upper inner aspect of the left breast. These were separated by approximately a centimeter. On ultrasound the lesion the 11:00 position measured 1.0 x 0.7 cm. The 12:00 lesion measured 1.6 x 0.9 cm. In addition within the axillary region there was a 2.1 x 1.7 suspicious appearing lymph node.. The patient proceeded to undergo ultrasound-guided biopsy of the 2 areas within the left breast as well as the suspicious left axillary lymph node. Biopsies from both breast areas revealed invasive ductal carcinoma. In addition the lymph node from the left axilla revealed ductal carcinoma. The lesions appeared to be high-grade. There was no HER-2/neu amplification and hormone staining was minimal consistent with essentially consistent with triple negative breast cancer.  Patient's case was discussed at the multidisciplinary breast conference her pathology and radiology were reviewed.  2. Patient underwent lumpectomy of the left breast with Dr. Donell Beers on 02/04/13, a 1.6cm tumor was removed along with an axillary lymph node dissection that found 1/19 nodes positive for metastatic disease.    3. Patient was then started on adjuvant chemotherapy on 03/05/13 with Adriamycin/Cytoxan. She began Taxol Carbo on 05/07/13  CURRENT THERAPY: Taxol Carbo week 6  INTERVAL HISTORY: Kristi [name] 57 y.o. female returns for evaluation prior to Taxol Carbo.  She's doing well  this week.  She denies fevers, chills, nausea, vomiting, constipation, diarrhea, numbness, or any further concerns.  A 10 point ROS is negative.     MEDICAL HISTORY: Past Medical History  Diagnosis Date  . Anxiety   . GERD (gastroesophageal reflux disease)   . Hypertension   . Breast cancer   . MRSA (methicillin resistant Staphylococcus aureus) 2009    ALLERGIES:  has No Known Allergies.  MEDICATIONS:  Current Outpatient Prescriptions  Medication Sig Dispense Refill  . ALPRAZolam (XANAX) 1 MG tablet Take 2 mg by mouth at bedtime as needed for sleep.       Marland Kitchen amphetamine-dextroamphetamine (ADDERALL) 10 MG tablet Take 20 mg by mouth daily.       . B Complex-C (SUPER B COMPLEX PO) Take 1 tablet by mouth daily.      . ciprofloxacin (CIPRO) 500 MG tablet Take 1 tablet (500 mg total) by mouth 2 (two) times daily.  14 tablet  6  . COMPRO 25 MG suppository       . dexamethasone (DECADRON) 4 MG tablet       . dexamethasone (DECADRON) 4 MG tablet Take 2 tablets (8 mg total) by mouth 2 (two) times daily with a meal. Take two times a day starting the day after chemotherapy for 3 days.  30 tablet  1  . escitalopram (LEXAPRO) 20 MG tablet Take 40 mg by mouth daily.       Marland Kitchen esomeprazole (NEXIUM) 40 MG capsule Take 40 mg by mouth daily as needed. For reflux      . lidocaine-prilocaine (EMLA) cream Apply topically as needed.  30 g  7  . LORazepam (ATIVAN) 0.5 MG tablet  Take 1 tablet (0.5 mg total) by mouth every 6 (six) hours as needed for anxiety.  30 tablet  0  . LORazepam (ATIVAN) 0.5 MG tablet Take 1 tablet (0.5 mg total) by mouth every 6 (six) hours as needed (Nausea or vomiting).  30 tablet  0  . Multiple Vitamin (MULTIVITAMIN) tablet Take 1 tablet by mouth daily.      . Omega-3 Fatty Acids (FISH OIL PO) Take 1 capsule by mouth daily.      . ondansetron (ZOFRAN) 8 MG tablet       . ondansetron (ZOFRAN) 8 MG tablet Take 1 tablet (8 mg total) by mouth 2 (two) times daily. Take two times a day  starting the day after chemo for 3 days. Then take two times a day as needed for nausea or vomiting.  30 tablet  1  . OVER THE COUNTER MEDICATION Take by mouth as needed.      Marland Kitchen OVER THE COUNTER MEDICATION Take by mouth as needed. OTC stool softner, as needed      . potassium chloride SA (K-DUR,KLOR-CON) 20 MEQ tablet Take 1 tablet (20 mEq total) by mouth 2 (two) times daily.  60 tablet  0  . prochlorperazine (COMPAZINE) 10 MG tablet       . prochlorperazine (COMPAZINE) 10 MG tablet Take 1 tablet (10 mg total) by mouth every 6 (six) hours as needed (Nausea or vomiting).  30 tablet  1  . prochlorperazine (COMPAZINE) 25 MG suppository Place 1 suppository (25 mg total) rectally every 12 (twelve) hours as needed for nausea.  12 suppository  3  . valsartan-hydrochlorothiazide (DIOVAN-HCT) 320-25 MG per tablet Take 1 tablet by mouth daily.        No current facility-administered medications for this visit.    SURGICAL HISTORY:  Past Surgical History  Procedure Laterality Date  . Anal sphincterotomy  04/2011  . Hemorrhoid surgery  04/2011    ligation  . Breast lumpectomy with needle localization Left 02/04/2013    Procedure: LEFT BREAST LUMPECTOMY WITH NEEDLE LOCALIZATION;  Surgeon: Almond Lint, MD;  Location: [facility] OR;  Service: General;  Laterality: Left;  . Axillary lymph node dissection Left 02/04/2013    Procedure: LEFT AXILLARY LYMPH NODE DISSECTION;  Surgeon: Almond Lint, MD;  Location: [facility] OR;  Service: General;  Laterality: Left;  End: 1512  . Portacath placement Right 02/04/2013    Procedure: INSERTION PORT-A-CATH;  Surgeon: Almond Lint, MD;  Location: [facility] OR;  Service: General;  Laterality: Right;  Start Time: 1610.  Marland Kitchen Appendectomy  1980    REVIEW OF SYSTEMS:   General: fatigue (-), night sweats (-), fever (-), pain (-) Lymph: palpable nodes (-) HEENT: vision changes (-), mucositis (-), gum bleeding (-), epistaxis (-) Cardiovascular: chest pain (-), palpitations (-) Pulmonary: shortness of  breath (-), dyspnea on exertion (-), cough (-), hemoptysis (-) GI:  Early satiety (-), melena (-), dysphagia (-), nausea/vomiting (-), diarrhea (-) GU: dysuria (-), hematuria (-), incontinence (-) Musculoskeletal: joint swelling (-), joint pain (-), back pain (-) Neuro: weakness (-), numbness (-), headache (-), confusion (-) Skin: Rash (-), lesions (-), dryness (-) Psych: depression (-), suicidal/homicidal ideation (-), feeling of hopelessness (-)   PHYSICAL EXAMINATION: Blood pressure 114/69, pulse 69, temperature 98.5 F (36.9 C), temperature source Oral, resp. rate 20, height 5\' 2"  (1.575 m), weight 147 lb 8 oz (66.906 kg). Body mass index is 26.97 kg/(m^2). General: Patient is a well appearing female in no acute distress HEENT: PERRLA, sclerae anicteric no  conjunctival pallor, MMM Neck: supple, no palpable adenopathy Lungs: clear to auscultation bilaterally, no wheezes, rhonchi, or rales Cardiovascular: regular rate rhythm, S1, S2, no murmurs, rubs or gallops Abdomen: Soft, non-tender, non-distended, normoactive bowel sounds, no HSM Extremities: warm and well perfused, no clubbing, cyanosis, or edema Skin: No rashes or lesions Neuro: Non-focal Breasts: left breast lumpectomy site no nodularity, healing well, scar tissue underneath axillary incision, no erythema, swelling, drainage, right breast no masses or nodularity.  ECOG PERFORMANCE STATUS: 1 - Symptomatic but completely ambulatory  LABORATORY DATA: Lab Results  Component Value Date   WBC 3.1* 06/19/2013   HGB 8.2* 06/19/2013   HCT 25.6* 06/19/2013   MCV 93.1 06/19/2013   PLT 152 06/19/2013      Chemistry      Component Value Date/Time   NA 141 06/12/2013 1305   NA 137 02/05/2013 0505   K 3.7 06/12/2013 1305   K 3.4* 02/05/2013 0505   CL 100 04/23/2013 1447   CL 102 02/05/2013 0505   CO2 27 06/12/2013 1305   CO2 27 02/05/2013 0505   BUN 9.0 06/12/2013 1305   BUN 8 02/05/2013 0505   CREATININE 0.7 06/12/2013 1305   CREATININE 0.86  02/05/2013 0505      Component Value Date/Time   CALCIUM 10.3 06/12/2013 1305   CALCIUM 8.0* 02/05/2013 0505   ALKPHOS 96 06/12/2013 1305   AST 25 06/12/2013 1305   ALT 37 06/12/2013 1305   BILITOT 0.30 06/12/2013 1305       RADIOGRAPHIC STUDIES:  No results found.  ASSESSMENT: 57 year old female with new diagnosis of   #1 multifocal invasive ductal carcinoma grade 3 ER weakly positive at 3% PR negative HER-2/neu negative with Ki-67 of 90% tumor is very aggressive. She does have a palpable lymph node as well in the left axilla. Patient was seen in the multidisciplinary clinic for discussion of treatment options.   #2 patient underwent a lumpectomy with axillary lymph node dissection with Dr. Luz Brazen on 02/04/13. A 1.6 cm tumor was removed with 1/19 lymph nodes positive for disease.    #3 She was recommended adjuvant chemotherapy consisting of Adriamycin Cytoxan given dose dense for total of 4 cycles followed by Taxol carboplatinum weekly for 12 weeks. Rationale for this aggressive treatment was discussed with the patient since she is essentially has a triple-negative disease.   #4 certainly patient will need post lumpectomy radiation therapy she was seen by radiation oncology. This will begin after patient has completed her chemotherapy.   #5 we discussed role of genetics, and she had an appointment with Maylon Cos on 03/06/13 the results were negative.       PLAN:  1. Doing well. Labs are okay today, she will proceed with chemotherapy.    She will require neupogen x 5 days to prevent neutropenia.    #2 The peeling on the soles of the feet continues to be very mild and we will monitor this.  She's using Aquaphor BID and it is helping.  3. We will see Kristi [name] back in one week for cycle 7 Taxol/Carbo.  All questions were answered. The patient knows to call the clinic with any problems, questions or concerns. We can certainly see the patient much sooner if necessary.  I spent 25 minutes  counseling the patient face to face. The total time spent in the appointment was 30 minutes.  Kristi Ouch Lyn Hollingshead, NP Medical Oncology [facility] Phone: [phone] 06/19/2013, 1:38 PM

## 2013-06-19 NOTE — Patient Instructions (Signed)
Carboplatin injection  What is this medicine?  CARBOPLATIN (KAR boe pla tin) is a chemotherapy drug. It targets fast dividing cells, like cancer cells, and causes these cells to die. This medicine is used to treat ovarian cancer and many other cancers.  This medicine may be used for other purposes; ask your health care provider or pharmacist if you have questions.  What should I tell my health care provider before I take this medicine?  They need to know if you have any of these conditions:  -blood disorders  -hearing problems  -kidney disease  -recent or ongoing radiation therapy  -an unusual or allergic reaction to carboplatin, cisplatin, other chemotherapy, other medicines, foods, dyes, or preservatives  -pregnant or trying to get pregnant  -breast-feeding  How should I use this medicine?  This drug is usually given as an infusion into a vein. It is administered in a hospital or clinic by a specially trained health care professional.  Talk to your pediatrician regarding the use of this medicine in children. Special care may be needed.  Overdosage: If you think you have taken too much of this medicine contact a poison control center or emergency room at once.  NOTE: This medicine is only for you. Do not share this medicine with others.  What if I miss a dose?  It is important not to miss a dose. Call your doctor or health care professional if you are unable to keep an appointment.  What may interact with this medicine?  -medicines for seizures  -medicines to increase blood counts like filgrastim, pegfilgrastim, sargramostim  -some antibiotics like amikacin, gentamicin, neomycin, streptomycin, tobramycin  -vaccines  Talk to your doctor or health care professional before taking any of these medicines:  -acetaminophen  -aspirin  -ibuprofen  -ketoprofen  -naproxen  This list may not describe all possible interactions. Give your health care provider a list of all the medicines, herbs, non-prescription drugs, or dietary  supplements you use. Also tell them if you smoke, drink alcohol, or use illegal drugs. Some items may interact with your medicine.  What should I watch for while using this medicine?  Your condition will be monitored carefully while you are receiving this medicine. You will need important blood work done while you are taking this medicine.  This drug may make you feel generally unwell. This is not uncommon, as chemotherapy can affect healthy cells as well as cancer cells. Report any side effects. Continue your course of treatment even though you feel ill unless your doctor tells you to stop.  In some cases, you may be given additional medicines to help with side effects. Follow all directions for their use.  Call your doctor or health care professional for advice if you get a fever, chills or sore throat, or other symptoms of a cold or flu. Do not treat yourself. This drug decreases your body's ability to fight infections. Try to avoid being around people who are sick.  This medicine may increase your risk to bruise or bleed. Call your doctor or health care professional if you notice any unusual bleeding.  Be careful brushing and flossing your teeth or using a toothpick because you may get an infection or bleed more easily. If you have any dental work done, tell your dentist you are receiving this medicine.  Avoid taking products that contain aspirin, acetaminophen, ibuprofen, naproxen, or ketoprofen unless instructed by your doctor. These medicines may hide a fever.  Do not become pregnant while taking this medicine.   Do not breast-feed an infant while taking this medicine. What side effects may I notice from receiving this medicine? Side effects that you should report to your  doctor or health care professional as soon as possible: -allergic reactions like skin rash, itching or hives, swelling of the face, lips, or tongue -signs of infection - fever or chills, cough, sore throat, pain or difficulty passing urine -signs of decreased platelets or bleeding - bruising, pinpoint red spots on the skin, black, tarry stools, nosebleeds -signs of decreased red blood cells - unusually weak or tired, fainting spells, lightheadedness -breathing problems -changes in hearing -changes in vision -chest pain -high blood pressure -low blood counts - This drug may decrease the number of white blood cells, red blood cells and platelets. You may be at increased risk for infections and bleeding. -nausea and vomiting -pain, swelling, redness or irritation at the injection site -pain, tingling, numbness in the hands or feet -problems with balance, talking, walking -trouble passing urine or change in the amount of urine Side effects that usually do not require medical attention (report to your doctor or health care professional if they continue or are bothersome): -hair loss -loss of appetite -metallic taste in the mouth or changes in taste This list may not describe all possible side effects. Call your doctor for medical advice about side effects. You may report side effects to FDA at 1-800-FDA-1088. Where should I keep my medicine? This drug is given in a hospital or clinic and will not be stored at home. NOTE: This sheet is a summary. It may not cover all possible information. If you have questions about this medicine, talk to your doctor, pharmacist, or health care provider.  2012, [employer] Standard. (01/28/2008 2:38:05 PM)Paclitaxel injection What is this medicine? PACLITAXEL (PAK li TAX el) is a chemotherapy drug. It targets fast dividing cells, like cancer cells, and causes these cells to die. This medicine is used to treat ovarian cancer, breast cancer, and other  cancers. This medicine may be used for other purposes; ask your health care provider or pharmacist if you have questions. What should I tell my health care provider before I take this medicine? They need to know if you have any of these conditions: -blood disorders -irregular heartbeat -infection (especially a virus infection such as chickenpox, cold sores, or herpes) -liver disease -previous or ongoing radiation therapy -an unusual or allergic reaction to paclitaxel, alcohol, polyoxyethylated castor oil, other chemotherapy agents, other medicines, foods, dyes, or preservatives -pregnant or trying to get pregnant -breast-feeding How should I use this medicine? This drug is given as an infusion into a vein. It is administered in a hospital or clinic by a specially trained health care professional. Talk to your pediatrician regarding the use of this medicine in children. Special care may be needed. Overdosage: If you think you have taken too much of this medicine contact a poison control center or emergency room at once. NOTE: This medicine is only for you. Do not share this medicine with others. What if I miss a dose? It is important not to miss your dose. Call your doctor or health care professional if you are unable to keep an appointment. What may interact with this medicine? Do not take this medicine with any of the following medications: -disulfiram -metronidazole This medicine may also interact with the following medications: -cyclosporine -dexamethasone -diazepam -ketoconazole -medicines to increase blood counts like filgrastim, pegfilgrastim, sargramostim -other chemotherapy drugs like cisplatin, doxorubicin, epirubicin, etoposide, teniposide, vincristine -quinidine -testosterone -vaccines -  verapamil Talk to your doctor or health care professional before taking any of these medicines: -acetaminophen -aspirin -ibuprofen -ketoprofen -naproxen This list may not describe  all possible interactions. Give your health care provider a list of all the medicines, herbs, non-prescription drugs, or dietary supplements you use. Also tell them if you smoke, drink alcohol, or use illegal drugs. Some items may interact with your medicine. What should I watch for while using this medicine? Your condition will be monitored carefully while you are receiving this medicine. You will need important blood work done while you are taking this medicine. This drug may make you feel generally unwell. This is not uncommon, as chemotherapy can affect healthy cells as well as cancer cells. Report any side effects. Continue your course of treatment even though you feel ill unless your doctor tells you to stop. In some cases, you may be given additional medicines to help with side effects. Follow all directions for their use. Call your doctor or health care professional for advice if you get a fever, chills or sore throat, or other symptoms of a cold or flu. Do not treat yourself. This drug decreases your body's ability to fight infections. Try to avoid being around people who are sick. This medicine may increase your risk to bruise or bleed. Call your doctor or health care professional if you notice any unusual bleeding. Be careful brushing and flossing your teeth or using a toothpick because you may get an infection or bleed more easily. If you have any dental work done, tell your dentist you are receiving this medicine. Avoid taking products that contain aspirin, acetaminophen, ibuprofen, naproxen, or ketoprofen unless instructed by your doctor. These medicines may hide a fever. Do not become pregnant while taking this medicine. Women should inform their doctor if they wish to become pregnant or think they might be pregnant. There is a potential for serious side effects to an unborn child. Talk to your health care professional or pharmacist for more information. Do not breast-feed an infant while  taking this medicine. Men are advised not to father a child while receiving this medicine. What side effects may I notice from receiving this medicine? Side effects that you should report to your doctor or health care professional as soon as possible: -allergic reactions like skin rash, itching or hives, swelling of the face, lips, or tongue -low blood counts - This drug may decrease the number of white blood cells, red blood cells and platelets. You may be at increased risk for infections and bleeding. -signs of infection - fever or chills, cough, sore throat, pain or difficulty passing urine -signs of decreased platelets or bleeding - bruising, pinpoint red spots on the skin, black, tarry stools, nosebleeds -signs of decreased red blood cells - unusually weak or tired, fainting spells, lightheadedness -breathing problems -chest pain -high or low blood pressure -mouth sores -nausea and vomiting -pain, swelling, redness or irritation at the injection site -pain, tingling, numbness in the hands or feet -slow or irregular heartbeat -swelling of the ankle, feet, hands Side effects that usually do not require medical attention (report to your doctor or health care professional if they continue or are bothersome): -bone pain -complete hair loss including hair on your head, underarms, pubic hair, eyebrows, and eyelashes -changes in the color of fingernails -diarrhea -loosening of the fingernails -loss of appetite -muscle or joint pain -red flush to skin -sweating This list may not describe all possible side effects. Call your doctor for medical advice  about side effects. You may report side effects to FDA at 1-800-FDA-1088. Where should I keep my medicine? This drug is given in a hospital or clinic and will not be stored at home. NOTE: This sheet is a summary. It may not cover all possible information. If you have questions about this medicine, talk to your doctor, pharmacist, or health care  provider.  2013, [employer] Standard. (10/05/2008 11:54:26 AM) Anemia, Frequently Asked Questions WHAT ARE THE SYMPTOMS OF ANEMIA?  Headache.  Difficulty thinking.  Fatigue.  Shortness of breath.  Weakness.  Rapid heartbeat. AT WHAT POINT ARE PEOPLE CONSIDERED ANEMIC?  This varies with gender and age.   Both hemoglobin (Hgb) and hematocrit values are used to define anemia. These lab values are obtained from a complete blood count (CBC) test. This is performed at a caregiver's office.  The normal range of hemoglobin values for adult men is 14.0 g/dL to 16.1 g/dL. For nonpregnant women, values are 12.3 g/dL to 09.6 g/dL.  The World Health Organization defines anemia as less than 12 g/dL for nonpregnant women and less than 13 g/dL for men.  For adult males, the average normal hematocrit is 46%, and the range is 40% to 52%.  For adult females, the average normal hematocrit is 41%, and the range is 35% to 47%.  Values that fall below the lower limits can be a sign of anemia and should have further checking (evaluation). GROUPS OF PEOPLE WHO ARE AT RISK FOR DEVELOPING ANEMIA INCLUDE:   Infants who are breastfed or taking a formula that is not fortified with iron.  Children going through a rapid growth spurt. The iron available can not keep up with the needs for a red cell mass which must grow with the child.  Women in childbearing years. They need iron because of blood loss during menstruation.  Pregnant women. The growing fetus creates a high demand for iron.  People with ongoing gastrointestinal blood loss are at risk of developing iron deficiency.  Individuals with leukemia or cancer who must receive chemotherapy or radiation to treat their disease. The drugs or radiation used to treat these diseases often decreases the bone marrow's ability to make cells of all classes. This includes red blood cells, white blood cells, and platelets.  Individuals with chronic  inflammatory conditions such as rheumatoid arthritis or chronic infections.  The elderly. ARE SOME TYPES OF ANEMIA INHERITED?   Yes, some types of anemia are due to inherited or genetic defects.  Sickle cell anemia. This occurs most often in people of African, African American, and Mediterranean descent.  Thalassemia (or Cooley's anemia). This type is found in people of Mediterranean and Southeast Asian descent. These types of anemia are common.  [place]. This is rare. CAN CERTAIN MEDICATIONS CAUSE A PERSON TO BECOME ANEMIC?  Yes. For example, drugs to fight cancer (chemotherapeutic agents) often cause anemia. These drugs can slow the bone marrow's ability to make red blood cells. If there are not enough red blood cells, the body does not get enough oxygen. WHAT HEMATOCRIT LEVEL IS REQUIRED TO DONATE BLOOD?  The lower limit of an acceptable hematocrit for blood donors is 38%. If you have a low hematocrit value, you should schedule an appointment with your caregiver. ARE BLOOD TRANSFUSIONS COMMONLY USED TO CORRECT ANEMIA, AND ARE THEY DANGEROUS?  They are used to treat anemia as a last resort. Your caregiver will find the cause of the anemia and correct it if possible. Most blood transfusions are given because of  excessive bleeding at the time of surgery, with trauma, or because of bone marrow suppression in patients with cancer or leukemia on chemotherapy. Blood transfusions are safer than ever before. We also know that blood transfusions affect the immune system and may increase certain risks. There is also a concern for human error. In 1/16,000 transfusions, a patient receives a transfusion of blood that is not matched with his or her blood type.  WHAT IS IRON DEFICIENCY ANEMIA AND CAN I CORRECT IT BY CHANGING MY DIET?  Iron is an essential part of hemoglobin. Without enough hemoglobin, anemia develops and the body does not get the right amount of oxygen. Iron deficiency anemia develops after  the body has had a low level of iron for a long time. This is either caused by blood loss, not taking in or absorbing enough iron, or increased demands for iron (like pregnancy or rapid growth).  Foods from animal origin such as beef, chicken, and pork, are good sources of iron. Be sure to have one of these foods at each meal. Vitamin C helps your body absorb iron. Foods rich in Vitamin C include citrus, bell pepper, strawberries, spinach and cantaloupe. In some cases, iron supplements may be needed in order to correct the iron deficiency. In the case of poor absorption, extra iron may have to be given directly into the vein through a needle (intravenously). I HAVE BEEN DIAGNOSED WITH IRON DEFICIENCY ANEMIA AND MY CAREGIVER PRESCRIBED IRON SUPPLEMENTS. HOW LONG WILL IT TAKE FOR MY BLOOD TO BECOME NORMAL?  It depends on the degree of anemia at the beginning of treatment. Most people with mild to moderate iron deficiency, anemia will correct the anemia over a period of 2 to 3 months. But after the anemia is corrected, the iron stored by the body is still low. Caregivers often suggest an additional 6 months of oral iron therapy once the anemia has been reversed. This will help prevent the iron deficiency anemia from quickly happening again. Non-anemic adult males should take iron supplements only under the direction of a doctor, too much iron can cause liver damage.  MY HEMOGLOBIN IS 9 G/DL AND I AM SCHEDULED FOR SURGERY. SHOULD I POSTPONE THE SURGERY?  If you have Hgb of 9, you should discuss this with your caregiver right away. Many patients with similar hemoglobin levels have had surgery without problems. If minimal blood loss is expected for a minor procedure, no treatment may be necessary.  If a greater blood loss is expected for more extensive procedures, you should ask your caregiver about being treated with erythropoietin and iron. This is to accelerate the recovery of your hemoglobin to a normal level  before surgery. An anemic patient who undergoes high-blood-loss surgery has a greater risk of surgical complications and need for a blood transfusion, which also carries some risk.  I HAVE BEEN TOLD THAT HEAVY MENSTRUAL PERIODS CAUSE ANEMIA. IS THERE ANYTHING I CAN DO TO PREVENT THE ANEMIA?  Anemia that results from heavy periods is usually due to iron deficiency. You can try to meet the increased demands for iron caused by the heavy monthly blood loss by increasing the intake of iron-rich foods. Iron supplements may be required. Discuss your concerns with your caregiver. WHAT CAUSES ANEMIA DURING PREGNANCY?  Pregnancy places major demands on the body. The mother must meet the needs of both her body and her growing baby. The body needs enough iron and folate to make the right amount of red blood cells. To prevent  anemia while pregnant, the mother should stay in close contact with her caregiver.  Be sure to eat a diet that has foods rich in iron and folate like liver and dark green leafy vegetables. Folate plays an important role in the normal development of a baby's spinal cord. Folate can help prevent serious disorders like spina bifida. If your diet does not provide adequate nutrients, you may want to talk with your caregiver about nutritional supplements.  WHAT IS THE RELATIONSHIP BETWEEN FIBROID TUMORS AND ANEMIA IN WOMEN?  The relationship is usually caused by the increased menstrual blood loss caused by fibroids. Good iron intake may be required to prevent iron deficiency anemia from developing.  Document Released: 05/31/2004 Document Revised: 01/15/2012 Document Reviewed: 11/15/2010 [facility] Patient Information 2014 [place], [place].

## 2013-06-20 ENCOUNTER — Ambulatory Visit (HOSPITAL_BASED_OUTPATIENT_CLINIC_OR_DEPARTMENT_OTHER): Payer: BC Managed Care – PPO

## 2013-06-20 VITALS — BP 133/77 | HR 81 | Temp 97.6°F | Resp 20

## 2013-06-20 DIAGNOSIS — Z5189 Encounter for other specified aftercare: Secondary | ICD-10-CM

## 2013-06-20 DIAGNOSIS — C50219 Malignant neoplasm of upper-inner quadrant of unspecified female breast: Secondary | ICD-10-CM

## 2013-06-20 MED ORDER — FILGRASTIM 480 MCG/0.8ML IJ SOLN
480.0000 ug | Freq: Once | INTRAMUSCULAR | Status: AC
  Administered 2013-06-20: 480 ug via SUBCUTANEOUS
  Filled 2013-06-20: qty 0.8

## 2013-06-21 ENCOUNTER — Ambulatory Visit (HOSPITAL_BASED_OUTPATIENT_CLINIC_OR_DEPARTMENT_OTHER): Payer: BC Managed Care – PPO

## 2013-06-21 VITALS — BP 109/68 | HR 97 | Temp 98.6°F

## 2013-06-21 DIAGNOSIS — C50219 Malignant neoplasm of upper-inner quadrant of unspecified female breast: Secondary | ICD-10-CM

## 2013-06-21 DIAGNOSIS — Z5189 Encounter for other specified aftercare: Secondary | ICD-10-CM

## 2013-06-21 MED ORDER — FILGRASTIM 480 MCG/0.8ML IJ SOLN
480.0000 ug | Freq: Once | INTRAMUSCULAR | Status: AC
  Administered 2013-06-21: 480 ug via SUBCUTANEOUS

## 2013-06-23 ENCOUNTER — Ambulatory Visit: Payer: BC Managed Care – PPO | Admitting: Physical Therapy

## 2013-06-23 ENCOUNTER — Ambulatory Visit (HOSPITAL_BASED_OUTPATIENT_CLINIC_OR_DEPARTMENT_OTHER): Payer: BC Managed Care – PPO

## 2013-06-23 VITALS — BP 116/68 | HR 84 | Temp 98.3°F

## 2013-06-23 DIAGNOSIS — Z5189 Encounter for other specified aftercare: Secondary | ICD-10-CM

## 2013-06-23 DIAGNOSIS — C50219 Malignant neoplasm of upper-inner quadrant of unspecified female breast: Secondary | ICD-10-CM

## 2013-06-23 MED ORDER — FILGRASTIM 480 MCG/0.8ML IJ SOLN
480.0000 ug | Freq: Once | INTRAMUSCULAR | Status: AC
  Administered 2013-06-23: 480 ug via SUBCUTANEOUS
  Filled 2013-06-23: qty 0.8

## 2013-06-24 ENCOUNTER — Ambulatory Visit (HOSPITAL_BASED_OUTPATIENT_CLINIC_OR_DEPARTMENT_OTHER): Payer: BC Managed Care – PPO

## 2013-06-24 VITALS — BP 106/61 | HR 107 | Temp 98.2°F

## 2013-06-24 DIAGNOSIS — Z5189 Encounter for other specified aftercare: Secondary | ICD-10-CM

## 2013-06-24 DIAGNOSIS — C50219 Malignant neoplasm of upper-inner quadrant of unspecified female breast: Secondary | ICD-10-CM

## 2013-06-24 MED ORDER — FILGRASTIM 480 MCG/0.8ML IJ SOLN
480.0000 ug | Freq: Once | INTRAMUSCULAR | Status: AC
  Administered 2013-06-24: 480 ug via SUBCUTANEOUS
  Filled 2013-06-24: qty 0.8

## 2013-06-25 ENCOUNTER — Ambulatory Visit: Payer: BC Managed Care – PPO

## 2013-06-25 ENCOUNTER — Other Ambulatory Visit: Payer: BC Managed Care – PPO | Admitting: Lab

## 2013-06-25 ENCOUNTER — Ambulatory Visit (HOSPITAL_BASED_OUTPATIENT_CLINIC_OR_DEPARTMENT_OTHER): Payer: BC Managed Care – PPO

## 2013-06-25 ENCOUNTER — Ambulatory Visit: Payer: BC Managed Care – PPO | Admitting: Adult Health

## 2013-06-25 VITALS — BP 93/51 | HR 97 | Temp 98.5°F

## 2013-06-25 DIAGNOSIS — Z5189 Encounter for other specified aftercare: Secondary | ICD-10-CM

## 2013-06-25 DIAGNOSIS — C50219 Malignant neoplasm of upper-inner quadrant of unspecified female breast: Secondary | ICD-10-CM

## 2013-06-25 MED ORDER — FILGRASTIM 480 MCG/0.8ML IJ SOLN
480.0000 ug | Freq: Once | INTRAMUSCULAR | Status: AC
  Administered 2013-06-25: 480 ug via SUBCUTANEOUS
  Filled 2013-06-25: qty 0.8

## 2013-06-26 ENCOUNTER — Ambulatory Visit (HOSPITAL_BASED_OUTPATIENT_CLINIC_OR_DEPARTMENT_OTHER): Payer: BC Managed Care – PPO

## 2013-06-26 ENCOUNTER — Encounter: Payer: Self-pay | Admitting: Adult Health

## 2013-06-26 ENCOUNTER — Other Ambulatory Visit (HOSPITAL_BASED_OUTPATIENT_CLINIC_OR_DEPARTMENT_OTHER): Payer: BC Managed Care – PPO | Admitting: Lab

## 2013-06-26 ENCOUNTER — Other Ambulatory Visit: Payer: Self-pay | Admitting: *Deleted

## 2013-06-26 ENCOUNTER — Ambulatory Visit (HOSPITAL_BASED_OUTPATIENT_CLINIC_OR_DEPARTMENT_OTHER): Payer: BC Managed Care – PPO | Admitting: Adult Health

## 2013-06-26 ENCOUNTER — Telehealth: Payer: Self-pay | Admitting: *Deleted

## 2013-06-26 VITALS — BP 113/72 | HR 106 | Temp 98.6°F | Resp 20 | Ht 62.0 in | Wt 145.5 lb

## 2013-06-26 DIAGNOSIS — E876 Hypokalemia: Secondary | ICD-10-CM

## 2013-06-26 DIAGNOSIS — G629 Polyneuropathy, unspecified: Secondary | ICD-10-CM

## 2013-06-26 DIAGNOSIS — G5790 Unspecified mononeuropathy of unspecified lower limb: Secondary | ICD-10-CM

## 2013-06-26 DIAGNOSIS — C50912 Malignant neoplasm of unspecified site of left female breast: Secondary | ICD-10-CM

## 2013-06-26 DIAGNOSIS — C50219 Malignant neoplasm of upper-inner quadrant of unspecified female breast: Secondary | ICD-10-CM

## 2013-06-26 DIAGNOSIS — Z170 Estrogen receptor positive status [ER+]: Secondary | ICD-10-CM

## 2013-06-26 DIAGNOSIS — C50212 Malignant neoplasm of upper-inner quadrant of left female breast: Secondary | ICD-10-CM

## 2013-06-26 DIAGNOSIS — Z5111 Encounter for antineoplastic chemotherapy: Secondary | ICD-10-CM

## 2013-06-26 LAB — CBC WITH DIFFERENTIAL/PLATELET
BASO%: 0.2 % (ref 0.0–2.0)
EOS%: 0.1 % (ref 0.0–7.0)
HCT: 27.6 % — ABNORMAL LOW (ref 34.8–46.6)
LYMPH%: 14.2 % (ref 14.0–49.7)
MCH: 30.8 pg (ref 25.1–34.0)
MCHC: 32.2 g/dL (ref 31.5–36.0)
MONO#: 1.7 10*3/uL — ABNORMAL HIGH (ref 0.1–0.9)
NEUT%: 76.3 % (ref 38.4–76.8)
Platelets: 192 10*3/uL (ref 145–400)
RBC: 2.89 10*6/uL — ABNORMAL LOW (ref 3.70–5.45)
WBC: 18.5 10*3/uL — ABNORMAL HIGH (ref 3.9–10.3)
nRBC: 1 % — ABNORMAL HIGH (ref 0–0)

## 2013-06-26 LAB — COMPREHENSIVE METABOLIC PANEL (CC13)
ALT: 29 U/L (ref 0–55)
AST: 22 U/L (ref 5–34)
Alkaline Phosphatase: 91 U/L (ref 40–150)
BUN: 10.3 mg/dL (ref 7.0–26.0)
Chloride: 103 mEq/L (ref 98–109)
Creatinine: 0.8 mg/dL (ref 0.6–1.1)
Total Bilirubin: 0.39 mg/dL (ref 0.20–1.20)

## 2013-06-26 MED ORDER — HEPARIN SOD (PORK) LOCK FLUSH 100 UNIT/ML IV SOLN
500.0000 [IU] | Freq: Once | INTRAVENOUS | Status: AC | PRN
  Administered 2013-06-26: 500 [IU]
  Filled 2013-06-26: qty 5

## 2013-06-26 MED ORDER — FAMOTIDINE IN NACL 20-0.9 MG/50ML-% IV SOLN
20.0000 mg | Freq: Once | INTRAVENOUS | Status: AC
  Administered 2013-06-26: 20 mg via INTRAVENOUS

## 2013-06-26 MED ORDER — POTASSIUM CHLORIDE CRYS ER 20 MEQ PO TBCR: 20.0000 meq | EXTENDED_RELEASE_TABLET | Freq: Three times a day (TID) | ORAL | Status: DC

## 2013-06-26 MED ORDER — ONDANSETRON 16 MG/50ML IVPB (CHCC)
16.0000 mg | Freq: Once | INTRAVENOUS | Status: AC
  Administered 2013-06-26: 16 mg via INTRAVENOUS

## 2013-06-26 MED ORDER — SODIUM CHLORIDE 0.9 % IJ SOLN
10.0000 mL | INTRAMUSCULAR | Status: DC | PRN
  Administered 2013-06-26: 10 mL
  Filled 2013-06-26: qty 10

## 2013-06-26 MED ORDER — DIPHENHYDRAMINE HCL 50 MG/ML IJ SOLN
50.0000 mg | Freq: Once | INTRAMUSCULAR | Status: AC
  Administered 2013-06-26: 50 mg via INTRAVENOUS

## 2013-06-26 MED ORDER — PACLITAXEL CHEMO INJECTION 300 MG/50ML
80.0000 mg/m2 | Freq: Once | INTRAVENOUS | Status: AC
  Administered 2013-06-26: 138 mg via INTRAVENOUS
  Filled 2013-06-26: qty 23

## 2013-06-26 MED ORDER — GABAPENTIN 100 MG PO CAPS: 100.0000 mg | ORAL_CAPSULE | Freq: Three times a day (TID) | ORAL | Status: DC

## 2013-06-26 MED ORDER — SODIUM CHLORIDE 0.9 % IV SOLN
Freq: Once | INTRAVENOUS | Status: AC
  Administered 2013-06-26: 15:00:00 via INTRAVENOUS

## 2013-06-26 MED ORDER — SODIUM CHLORIDE 0.9 % IV SOLN
210.0000 mg | Freq: Once | INTRAVENOUS | Status: AC
  Administered 2013-06-26: 210 mg via INTRAVENOUS
  Filled 2013-06-26: qty 21

## 2013-06-26 MED ORDER — DEXAMETHASONE SODIUM PHOSPHATE 20 MG/5ML IJ SOLN
20.0000 mg | Freq: Once | INTRAMUSCULAR | Status: AC
  Administered 2013-06-26: 20 mg via INTRAVENOUS

## 2013-06-26 NOTE — Progress Notes (Signed)
OFFICE PROGRESS NOTE  CC**  [name],[name], MD [address] [place] [address]  DIAGNOSIS: 57 year old female with stage IIA ER weakly positive, PR negative HER-2/neu negative, invasive ductal carcinoma of the left breast.   PRIOR THERAPY: 1. Earlier this year it on routine screening mammography the patient was noted to have a abnormality within the 12:00 position of the left breast. Patient returned for additional imaging with 3-D, tomosynthesis. Within the left breast there were 2 masses somewhat lobulated with irregular margins in the upper inner aspect of the left breast. These were separated by approximately a centimeter. On ultrasound the lesion the 11:00 position measured 1.0 x 0.7 cm. The 12:00 lesion measured 1.6 x 0.9 cm. In addition within the axillary region there was a 2.1 x 1.7 suspicious appearing lymph node.. The patient proceeded to undergo ultrasound-guided biopsy of the 2 areas within the left breast as well as the suspicious left axillary lymph node. Biopsies from both breast areas revealed invasive ductal carcinoma. In addition the lymph node from the left axilla revealed ductal carcinoma. The lesions appeared to be high-grade. There was no HER-2/neu amplification and hormone staining was minimal consistent with essentially consistent with triple negative breast cancer.  Patient's case was discussed at the multidisciplinary breast conference her pathology and radiology were reviewed.  2. Patient underwent lumpectomy of the left breast with Dr. Donell Beers on 02/04/13, a 1.6cm tumor was removed along with an axillary lymph node dissection that found 1/19 nodes positive for metastatic disease.    3. Patient was then started on adjuvant chemotherapy on 03/05/13 with Adriamycin/Cytoxan. She began Taxol Carbo on 05/07/13  CURRENT THERAPY: Taxol Carbo week 7  INTERVAL HISTORY: Kristi [name] 57 y.o. female returns for evaluation prior to Taxol Carbo.  She is having some  mild neuropathy in her feet.  She denies difficulty walking.  She denies fevers, chills, nausea, vomiting, constipation, diarrhea, or any further concerns.  Otherwise, a 10 point ROS is neg.  MEDICAL HISTORY: Past Medical History  Diagnosis Date  . Anxiety   . GERD (gastroesophageal reflux disease)   . Hypertension   . Breast cancer   . MRSA (methicillin resistant Staphylococcus aureus) 2009    ALLERGIES:  has No Known Allergies.  MEDICATIONS:  Current Outpatient Prescriptions  Medication Sig Dispense Refill  . ALPRAZolam (XANAX) 1 MG tablet Take 2 mg by mouth at bedtime as needed for sleep.       Marland Kitchen amphetamine-dextroamphetamine (ADDERALL) 10 MG tablet Take 20 mg by mouth daily.       . B Complex-C (SUPER B COMPLEX PO) Take 1 tablet by mouth daily.      . ciprofloxacin (CIPRO) 500 MG tablet Take 1 tablet (500 mg total) by mouth 2 (two) times daily.  14 tablet  6  . COMPRO 25 MG suppository       . dexamethasone (DECADRON) 4 MG tablet       . dexamethasone (DECADRON) 4 MG tablet Take 2 tablets (8 mg total) by mouth 2 (two) times daily with a meal. Take two times a day starting the day after chemotherapy for 3 days.  30 tablet  1  . escitalopram (LEXAPRO) 20 MG tablet Take 40 mg by mouth daily.       Marland Kitchen esomeprazole (NEXIUM) 40 MG capsule Take 40 mg by mouth daily as needed. For reflux      . lidocaine-prilocaine (EMLA) cream Apply topically as needed.  30 g  7  .  LORazepam (ATIVAN) 0.5 MG tablet Take 1 tablet (0.5 mg total) by mouth every 6 (six) hours as needed for anxiety.  30 tablet  0  . LORazepam (ATIVAN) 0.5 MG tablet Take 1 tablet (0.5 mg total) by mouth every 6 (six) hours as needed (Nausea or vomiting).  30 tablet  0  . Multiple Vitamin (MULTIVITAMIN) tablet Take 1 tablet by mouth daily.      . Omega-3 Fatty Acids (FISH OIL PO) Take 1 capsule by mouth daily.      . ondansetron (ZOFRAN) 8 MG tablet       . ondansetron (ZOFRAN) 8 MG tablet Take 1 tablet (8 mg total) by mouth 2  (two) times daily. Take two times a day starting the day after chemo for 3 days. Then take two times a day as needed for nausea or vomiting.  30 tablet  1  . OVER THE COUNTER MEDICATION Take by mouth as needed.      Marland Kitchen OVER THE COUNTER MEDICATION Take by mouth as needed. OTC stool softner, as needed      . potassium chloride SA (K-DUR,KLOR-CON) 20 MEQ tablet Take 1 tablet (20 mEq total) by mouth 2 (two) times daily.  60 tablet  0  . prochlorperazine (COMPAZINE) 10 MG tablet       . prochlorperazine (COMPAZINE) 10 MG tablet Take 1 tablet (10 mg total) by mouth every 6 (six) hours as needed (Nausea or vomiting).  30 tablet  1  . prochlorperazine (COMPAZINE) 25 MG suppository Place 1 suppository (25 mg total) rectally every 12 (twelve) hours as needed for nausea.  12 suppository  3  . valsartan-hydrochlorothiazide (DIOVAN-HCT) 320-25 MG per tablet Take 1 tablet by mouth daily.        No current facility-administered medications for this visit.    SURGICAL HISTORY:  Past Surgical History  Procedure Laterality Date  . Anal sphincterotomy  04/2011  . Hemorrhoid surgery  04/2011    ligation  . Breast lumpectomy with needle localization Left 02/04/2013    Procedure: LEFT BREAST LUMPECTOMY WITH NEEDLE LOCALIZATION;  Surgeon: Almond Lint, MD;  Location: [facility] OR;  Service: General;  Laterality: Left;  . Axillary lymph node dissection Left 02/04/2013    Procedure: LEFT AXILLARY LYMPH NODE DISSECTION;  Surgeon: Almond Lint, MD;  Location: [facility] OR;  Service: General;  Laterality: Left;  End: 1512  . Portacath placement Right 02/04/2013    Procedure: INSERTION PORT-A-CATH;  Surgeon: Almond Lint, MD;  Location: [facility] OR;  Service: General;  Laterality: Right;  Start Time: 0960.  Marland Kitchen Appendectomy  1980    REVIEW OF SYSTEMS:   General: fatigue (-), night sweats (-), fever (-), pain (-) Lymph: palpable nodes (-) HEENT: vision changes (-), mucositis (-), gum bleeding (-), epistaxis (-) Cardiovascular: chest pain (-),  palpitations (-) Pulmonary: shortness of breath (-), dyspnea on exertion (-), cough (-), hemoptysis (-) GI:  Early satiety (-), melena (-), dysphagia (-), nausea/vomiting (-), diarrhea (-) GU: dysuria (-), hematuria (-), incontinence (-) Musculoskeletal: joint swelling (-), joint pain (-), back pain (-) Neuro: weakness (-), numbness (-), headache (-), confusion (-) Skin: Rash (-), lesions (-), dryness (-) Psych: depression (-), suicidal/homicidal ideation (-), feeling of hopelessness (-)   PHYSICAL EXAMINATION: Blood pressure 113/72, pulse 106, temperature 98.6 F (37 C), temperature source Oral, resp. rate 20, height 5\' 2"  (1.575 m), weight 145 lb 8 oz (65.998 kg). Body mass index is 26.61 kg/(m^2). General: Patient is a well appearing female in no acute distress  HEENT: PERRLA, sclerae anicteric no conjunctival pallor, MMM Neck: supple, no palpable adenopathy Lungs: clear to auscultation bilaterally, no wheezes, rhonchi, or rales Cardiovascular: regular rate rhythm, S1, S2, no murmurs, rubs or gallops Abdomen: Soft, non-tender, non-distended, normoactive bowel sounds, no HSM Extremities: warm and well perfused, no clubbing, cyanosis, or edema Skin: No rashes or lesions Neuro: Non-focal Breasts: left breast lumpectomy site no nodularity, healing well, scar tissue underneath axillary incision, no erythema, swelling, drainage, right breast no masses or nodularity.  ECOG PERFORMANCE STATUS: 1 - Symptomatic but completely ambulatory  LABORATORY DATA: Lab Results  Component Value Date   WBC 18.5* 06/26/2013   HGB 8.9* 06/26/2013   HCT 27.6* 06/26/2013   MCV 95.5 06/26/2013   PLT 192 06/26/2013      Chemistry      Component Value Date/Time   NA 140 06/19/2013 1251   NA 137 02/05/2013 0505   K 3.8 06/19/2013 1251   K 3.4* 02/05/2013 0505   CL 100 04/23/2013 1447   CL 102 02/05/2013 0505   CO2 26 06/19/2013 1251   CO2 27 02/05/2013 0505   BUN 12.1 06/19/2013 1251   BUN 8 02/05/2013 0505    CREATININE 0.8 06/19/2013 1251   CREATININE 0.86 02/05/2013 0505      Component Value Date/Time   CALCIUM 9.9 06/19/2013 1251   CALCIUM 8.0* 02/05/2013 0505   ALKPHOS 52 06/19/2013 1251   AST 19 06/19/2013 1251   ALT 27 06/19/2013 1251   BILITOT 0.59 06/19/2013 1251       RADIOGRAPHIC STUDIES:  No results found.  ASSESSMENT: 57 year old female with new diagnosis of   #1 multifocal invasive ductal carcinoma grade 3 ER weakly positive at 3% PR negative HER-2/neu negative with Ki-67 of 90% tumor is very aggressive. She does have a palpable lymph node as well in the left axilla. Patient was seen in the multidisciplinary clinic for discussion of treatment options.   #2 patient underwent a lumpectomy with axillary lymph node dissection with Dr. Luz Brazen on 02/04/13. A 1.6 cm tumor was removed with 1/19 lymph nodes positive for disease.    #3 She was recommended adjuvant chemotherapy consisting of Adriamycin Cytoxan given dose dense for total of 4 cycles followed by Taxol carboplatinum weekly for 12 weeks. Rationale for this aggressive treatment was discussed with the patient since she is essentially has a triple-negative disease.   #4 certainly patient will need post lumpectomy radiation therapy she was seen by radiation oncology. This will begin after patient has completed her chemotherapy.   #5 we discussed role of genetics, and she had an appointment with Maylon Cos on 03/06/13 the results were negative.       PLAN:  1. Doing well. Labs are stable.  The patient will proceed with chemotherapy today.  She is due for week 7 of taxol carbo.    #2 The peeling on the soles of the feet is improved from last week.    3. We will see Kristi [name] back in one week for cycle 8 Taxol/Carbo.  4. I started Gabapentin TID for the neuropathy.  I gave her details on the medications in her AVS.    All questions were answered. The patient knows to call the clinic with any problems, questions or concerns. We can  certainly see the patient much sooner if necessary.  I spent 25 minutes counseling the patient face to face. The total time spent in the appointment was 30 minutes.  Cherie Ouch Lyn Hollingshead, NP Medical Oncology  [facility] Phone: [phone] 06/26/2013, 2:13 PM

## 2013-06-26 NOTE — Patient Instructions (Addendum)
[facility] Discharge Instructions for Patients Receiving Chemotherapy  Today you received the following chemotherapy agents: Taxol, Carboplatin   To help prevent nausea and vomiting after your treatment, we encourage you to take your nausea medication as directed by your physician/NP   If you develop nausea and vomiting that is not controlled by your nausea medication, call the clinic.   BELOW ARE SYMPTOMS THAT SHOULD BE REPORTED IMMEDIATELY:  *FEVER GREATER THAN 100.5 F  *CHILLS WITH OR WITHOUT FEVER  NAUSEA AND VOMITING THAT IS NOT CONTROLLED WITH YOUR NAUSEA MEDICATION  *UNUSUAL SHORTNESS OF BREATH  *UNUSUAL BRUISING OR BLEEDING  TENDERNESS IN MOUTH AND THROAT WITH OR WITHOUT PRESENCE OF ULCERS  *URINARY PROBLEMS  *BOWEL PROBLEMS  UNUSUAL RASH Items with * indicate a potential emergency and should be followed up as soon as possible.  Feel free to call the clinic you have any questions or concerns. The clinic phone number is [phone].

## 2013-06-26 NOTE — Patient Instructions (Signed)
Gabapentin capsules or tablets What is this medicine? GABAPENTIN (GA ba pen tin) is used to control partial seizures in adults with epilepsy. It is also used to treat certain types of nerve pain. This medicine may be used for other purposes; ask your health care provider or pharmacist if you have questions. What should I tell my health care provider before I take this medicine? They need to know if you have any of these conditions: -kidney disease -suicidal thoughts, plans, or attempt; a previous suicide attempt by you or a family member -an unusual or allergic reaction to gabapentin, other medicines, foods, dyes, or preservatives -pregnant or trying to get pregnant -breast-feeding How should I use this medicine? Take this medicine by mouth. Swallow it with a drink of water. Follow the directions on the prescription label. If this medicine upsets your stomach, take it with food or milk. Take your medicine at regular intervals. Do not take it more often than directed. If you are directed to break the 600 or 800 mg tablets in half as part of your dose, the extra half tablet should be used for the next dose. If you have not used the extra half tablet within 3 days, it should be thrown away. A special MedGuide will be given to you by the pharmacist with each prescription and refill. Be sure to read this information carefully each time. Talk to your pediatrician regarding the use of this medicine in children. Special care may be needed. Overdosage: If you think you have taken too much of this medicine contact a poison control center or emergency room at once. NOTE: This medicine is only for you. Do not share this medicine with others. What if I miss a dose? If you miss a dose, take it as soon as you can. If it is almost time for your next dose, take only that dose. Do not take double or extra doses. What may interact with this medicine? -antacids -hydrocodone -morphine -naproxen -sevelamer This  list may not describe all possible interactions. Give your health care provider a list of all the medicines, herbs, non-prescription drugs, or dietary supplements you use. Also tell them if you smoke, drink alcohol, or use illegal drugs. Some items may interact with your medicine. What should I watch for while using this medicine? Visit your doctor or health care professional for regular checks on your progress. You may want to keep a record at home of how you feel your condition is responding to treatment. You may want to share this information with your doctor or health care professional at each visit. You should contact your doctor or health care professional if your seizures get worse or if you have any new types of seizures. Do not stop taking this medicine or any of your seizure medicines unless instructed by your doctor or health care professional. Stopping your medicine suddenly can increase your seizures or their severity. Wear a medical identification bracelet or chain if you are taking this medicine for seizures, and carry a card that lists all your medications. You may get drowsy, dizzy, or have blurred vision. Do not drive, use machinery, or do anything that needs mental alertness until you know how this medicine affects you. To reduce dizzy or fainting spells, do not sit or stand up quickly, especially if you are an older patient. Alcohol can increase drowsiness and dizziness. Avoid alcoholic drinks. Your mouth may get dry. Chewing sugarless gum or sucking hard candy, and drinking plenty of water will help.   The use of this medicine may increase the chance of suicidal thoughts or actions. Pay special attention to how you are responding while on this medicine. Any worsening of mood, or thoughts of suicide or dying should be reported to your health care professional right away. Women who become pregnant while using this medicine may enroll in the [place] Antiepileptic Drug Pregnancy Registry  by calling 1-888-233-2334. This registry collects information about the safety of antiepileptic drug use during pregnancy. What side effects may I notice from receiving this medicine? Side effects that you should report to your doctor or health care professional as soon as possible: -allergic reactions like skin rash, itching or hives, swelling of the face, lips, or tongue -worsening of mood, thoughts or actions of suicide or dying Side effects that usually do not require medical attention (report to your doctor or health care professional if they continue or are bothersome): -constipation -difficulty walking or controlling muscle movements -nausea -slurred speech -tremors -weight gain This list may not describe all possible side effects. Call your doctor for medical advice about side effects. You may report side effects to FDA at 1-800-FDA-1088. Where should I keep my medicine? Keep out of reach of children. Store at room temperature between 15 and 30 degrees C (59 and 86 degrees F). Throw away any unused medicine after the expiration date. NOTE: This sheet is a summary. It may not cover all possible information. If you have questions about this medicine, talk to your doctor, pharmacist, or health care provider.  2012, [employer] Standard. (06/21/2010 6:06:26 PM) 

## 2013-06-26 NOTE — Telephone Encounter (Signed)
Dianne from lab called to alert staff of patient's potassium level.  Reviewed results with Amy Allyson Sabal PA-C.  Verbal order received and read back for patient to take one 20 meq pill TID x 5 days.  Patient notified and given the [place] potassium content in foods handout.

## 2013-06-26 NOTE — Telephone Encounter (Signed)
Patient's daughter called and left message regarding her appts for today. I h ave called her back and left message to call back.

## 2013-06-30 ENCOUNTER — Ambulatory Visit: Payer: BC Managed Care – PPO | Admitting: Physical Therapy

## 2013-07-02 ENCOUNTER — Ambulatory Visit: Payer: BC Managed Care – PPO | Admitting: Adult Health

## 2013-07-02 ENCOUNTER — Ambulatory Visit: Payer: BC Managed Care – PPO

## 2013-07-02 ENCOUNTER — Other Ambulatory Visit: Payer: BC Managed Care – PPO | Admitting: Lab

## 2013-07-03 ENCOUNTER — Telehealth: Payer: Self-pay | Admitting: Oncology

## 2013-07-03 ENCOUNTER — Other Ambulatory Visit (HOSPITAL_BASED_OUTPATIENT_CLINIC_OR_DEPARTMENT_OTHER): Payer: BC Managed Care – PPO | Admitting: Lab

## 2013-07-03 ENCOUNTER — Ambulatory Visit (HOSPITAL_BASED_OUTPATIENT_CLINIC_OR_DEPARTMENT_OTHER): Payer: BC Managed Care – PPO | Admitting: Adult Health

## 2013-07-03 ENCOUNTER — Ambulatory Visit (HOSPITAL_BASED_OUTPATIENT_CLINIC_OR_DEPARTMENT_OTHER): Payer: BC Managed Care – PPO

## 2013-07-03 ENCOUNTER — Encounter: Payer: Self-pay | Admitting: Adult Health

## 2013-07-03 VITALS — BP 115/75 | HR 97 | Temp 98.2°F | Resp 20 | Ht 62.0 in | Wt 143.3 lb

## 2013-07-03 DIAGNOSIS — C50219 Malignant neoplasm of upper-inner quadrant of unspecified female breast: Secondary | ICD-10-CM

## 2013-07-03 DIAGNOSIS — Z5111 Encounter for antineoplastic chemotherapy: Secondary | ICD-10-CM

## 2013-07-03 DIAGNOSIS — R599 Enlarged lymph nodes, unspecified: Secondary | ICD-10-CM

## 2013-07-03 DIAGNOSIS — IMO0002 Reserved for concepts with insufficient information to code with codable children: Secondary | ICD-10-CM

## 2013-07-03 DIAGNOSIS — C50212 Malignant neoplasm of upper-inner quadrant of left female breast: Secondary | ICD-10-CM

## 2013-07-03 DIAGNOSIS — R21 Rash and other nonspecific skin eruption: Secondary | ICD-10-CM

## 2013-07-03 DIAGNOSIS — C50912 Malignant neoplasm of unspecified site of left female breast: Secondary | ICD-10-CM

## 2013-07-03 LAB — COMPREHENSIVE METABOLIC PANEL (CC13)
ALT: 28 U/L (ref 0–55)
BUN: 12.9 mg/dL (ref 7.0–26.0)
CO2: 25 mEq/L (ref 22–29)
Calcium: 9.6 mg/dL (ref 8.4–10.4)
Chloride: 105 mEq/L (ref 98–109)
Creatinine: 0.7 mg/dL (ref 0.6–1.1)
Total Bilirubin: 0.32 mg/dL (ref 0.20–1.20)

## 2013-07-03 LAB — CBC WITH DIFFERENTIAL/PLATELET
BASO%: 0.3 % (ref 0.0–2.0)
Eosinophils Absolute: 0 10*3/uL (ref 0.0–0.5)
HCT: 27.2 % — ABNORMAL LOW (ref 34.8–46.6)
LYMPH%: 37.8 % (ref 14.0–49.7)
MCHC: 32.4 g/dL (ref 31.5–36.0)
MCV: 95.8 fL (ref 79.5–101.0)
MONO#: 0.1 10*3/uL (ref 0.1–0.9)
MONO%: 4.3 % (ref 0.0–14.0)
NEUT%: 57.6 % (ref 38.4–76.8)
Platelets: 120 10*3/uL — ABNORMAL LOW (ref 145–400)
RBC: 2.84 10*6/uL — ABNORMAL LOW (ref 3.70–5.45)
nRBC: 0 % (ref 0–0)

## 2013-07-03 MED ORDER — DIPHENHYDRAMINE HCL 50 MG/ML IJ SOLN
50.0000 mg | Freq: Once | INTRAMUSCULAR | Status: AC
  Administered 2013-07-03: 50 mg via INTRAVENOUS

## 2013-07-03 MED ORDER — PACLITAXEL CHEMO INJECTION 300 MG/50ML
80.0000 mg/m2 | Freq: Once | INTRAVENOUS | Status: AC
  Administered 2013-07-03: 138 mg via INTRAVENOUS
  Filled 2013-07-03: qty 23

## 2013-07-03 MED ORDER — SODIUM CHLORIDE 0.9 % IV SOLN
Freq: Once | INTRAVENOUS | Status: AC
  Administered 2013-07-03: 15:00:00 via INTRAVENOUS

## 2013-07-03 MED ORDER — HEPARIN SOD (PORK) LOCK FLUSH 100 UNIT/ML IV SOLN
500.0000 [IU] | Freq: Once | INTRAVENOUS | Status: AC | PRN
  Administered 2013-07-03: 500 [IU]
  Filled 2013-07-03: qty 5

## 2013-07-03 MED ORDER — FAMOTIDINE IN NACL 20-0.9 MG/50ML-% IV SOLN
20.0000 mg | Freq: Once | INTRAVENOUS | Status: AC
  Administered 2013-07-03: 20 mg via INTRAVENOUS

## 2013-07-03 MED ORDER — ONDANSETRON 16 MG/50ML IVPB (CHCC)
16.0000 mg | Freq: Once | INTRAVENOUS | Status: AC
  Administered 2013-07-03: 16 mg via INTRAVENOUS

## 2013-07-03 MED ORDER — SODIUM CHLORIDE 0.9 % IV SOLN
210.0000 mg | Freq: Once | INTRAVENOUS | Status: AC
  Administered 2013-07-03: 210 mg via INTRAVENOUS
  Filled 2013-07-03: qty 21

## 2013-07-03 MED ORDER — DEXAMETHASONE SODIUM PHOSPHATE 20 MG/5ML IJ SOLN
20.0000 mg | Freq: Once | INTRAMUSCULAR | Status: AC
  Administered 2013-07-03: 20 mg via INTRAVENOUS

## 2013-07-03 MED ORDER — SODIUM CHLORIDE 0.9 % IJ SOLN
10.0000 mL | INTRAMUSCULAR | Status: DC | PRN
  Administered 2013-07-03: 10 mL
  Filled 2013-07-03: qty 10

## 2013-07-03 NOTE — Progress Notes (Signed)
Discharged at 1815 with this nurse walking with her.  One arm held lightly by me.  Patient denied need for wheelchair.  Sleeping at intervals but easily arousable.  Patient's daughter with her but walks with cane.  No distress.

## 2013-07-03 NOTE — Progress Notes (Signed)
OFFICE PROGRESS NOTE  CC**  [name],[name], MD [address], [address] [place] [address]  DIAGNOSIS: 57 year old female with stage IIA ER weakly positive, PR negative HER-2/neu negative, invasive ductal carcinoma of the left breast.   PRIOR THERAPY: 1. Earlier this year it on routine screening mammography the patient was noted to have a abnormality within the 12:00 position of the left breast. Patient returned for additional imaging with 3-D, tomosynthesis. Within the left breast there were 2 masses somewhat lobulated with irregular margins in the upper inner aspect of the left breast. These were separated by approximately a centimeter. On ultrasound the lesion the 11:00 position measured 1.0 x 0.7 cm. The 12:00 lesion measured 1.6 x 0.9 cm. In addition within the axillary region there was a 2.1 x 1.7 suspicious appearing lymph node.. The patient proceeded to undergo ultrasound-guided biopsy of the 2 areas within the left breast as well as the suspicious left axillary lymph node. Biopsies from both breast areas revealed invasive ductal carcinoma. In addition the lymph node from the left axilla revealed ductal carcinoma. The lesions appeared to be high-grade. There was no HER-2/neu amplification and hormone staining was minimal consistent with essentially consistent with triple negative breast cancer.  Patient's case was discussed at the multidisciplinary breast conference her pathology and radiology were reviewed.  2. Patient underwent lumpectomy of the left breast with Dr. Donell Beers on 02/04/13, a 1.6cm tumor was removed along with an axillary lymph node dissection that found 1/19 nodes positive for metastatic disease.    3. Patient was then started on adjuvant chemotherapy on 03/05/13 with Adriamycin/Cytoxan. She began Taxol Carbo on 05/07/13  CURRENT THERAPY: Taxol Carbo week 8  INTERVAL HISTORY: NYKERRIA MACCONNELL 57 y.o. female returns for evaluation prior to Taxol Carbo.   She is doing  well today.   She denies fevers, chills, nausea, vomiting, constipation, diarrhea.  She started Gabapentin last week and developed a mild erythematous rash on her right wrist and right knee.  She is also concerned about her toenails bilaterally, they feel like they are coming off and are a different shade than the rest of her nails.  Otherwise, a 10 point ROS is neg.   MEDICAL HISTORY: Past Medical History  Diagnosis Date  . Anxiety   . GERD (gastroesophageal reflux disease)   . Hypertension   . Breast cancer   . MRSA (methicillin resistant Staphylococcus aureus) 2009    ALLERGIES:  has No Known Allergies.  MEDICATIONS:  Current Outpatient Prescriptions  Medication Sig Dispense Refill  . ALPRAZolam (XANAX) 1 MG tablet Take 2 mg by mouth at bedtime as needed for sleep.       Marland Kitchen amphetamine-dextroamphetamine (ADDERALL) 10 MG tablet Take 20 mg by mouth daily.       . B Complex-C (SUPER B COMPLEX PO) Take 1 tablet by mouth daily.      . ciprofloxacin (CIPRO) 500 MG tablet Take 1 tablet (500 mg total) by mouth 2 (two) times daily.  14 tablet  6  . COMPRO 25 MG suppository       . dexamethasone (DECADRON) 4 MG tablet       . dexamethasone (DECADRON) 4 MG tablet Take 2 tablets (8 mg total) by mouth 2 (two) times daily with a meal. Take two times a day starting the day after chemotherapy for 3 days.  30 tablet  1  . escitalopram (LEXAPRO) 20 MG tablet Take 40 mg by mouth daily.       Marland Kitchen  esomeprazole (NEXIUM) 40 MG capsule Take 40 mg by mouth daily as needed. For reflux      . gabapentin (NEURONTIN) 100 MG capsule Take 1 capsule (100 mg total) by mouth 3 (three) times daily.  90 capsule  2  . lidocaine-prilocaine (EMLA) cream Apply topically as needed.  30 g  7  . LORazepam (ATIVAN) 0.5 MG tablet Take 1 tablet (0.5 mg total) by mouth every 6 (six) hours as needed for anxiety.  30 tablet  0  . LORazepam (ATIVAN) 0.5 MG tablet Take 1 tablet (0.5 mg total) by mouth every 6 (six) hours as needed  (Nausea or vomiting).  30 tablet  0  . Multiple Vitamin (MULTIVITAMIN) tablet Take 1 tablet by mouth daily.      . Omega-3 Fatty Acids (FISH OIL PO) Take 1 capsule by mouth daily.      . ondansetron (ZOFRAN) 8 MG tablet       . ondansetron (ZOFRAN) 8 MG tablet Take 1 tablet (8 mg total) by mouth 2 (two) times daily. Take two times a day starting the day after chemo for 3 days. Then take two times a day as needed for nausea or vomiting.  30 tablet  1  . OVER THE COUNTER MEDICATION Take by mouth as needed.      Marland Kitchen OVER THE COUNTER MEDICATION Take by mouth as needed. OTC stool softner, as needed      . potassium chloride SA (K-DUR,KLOR-CON) 20 MEQ tablet Take 1 tablet (20 mEq total) by mouth 3 (three) times daily. For 5 days then as directed.  60 tablet  0  . prochlorperazine (COMPAZINE) 10 MG tablet       . prochlorperazine (COMPAZINE) 10 MG tablet Take 1 tablet (10 mg total) by mouth every 6 (six) hours as needed (Nausea or vomiting).  30 tablet  1  . prochlorperazine (COMPAZINE) 25 MG suppository Place 1 suppository (25 mg total) rectally every 12 (twelve) hours as needed for nausea.  12 suppository  3  . valsartan-hydrochlorothiazide (DIOVAN-HCT) 320-25 MG per tablet Take 1 tablet by mouth daily.        No current facility-administered medications for this visit.    SURGICAL HISTORY:  Past Surgical History  Procedure Laterality Date  . Anal sphincterotomy  04/2011  . Hemorrhoid surgery  04/2011    ligation  . Breast lumpectomy with needle localization Left 02/04/2013    Procedure: LEFT BREAST LUMPECTOMY WITH NEEDLE LOCALIZATION;  Surgeon: Almond Lint, MD;  Location: [facility] OR;  Service: General;  Laterality: Left;  . Axillary lymph node dissection Left 02/04/2013    Procedure: LEFT AXILLARY LYMPH NODE DISSECTION;  Surgeon: Almond Lint, MD;  Location: [facility] OR;  Service: General;  Laterality: Left;  End: 1512  . Portacath placement Right 02/04/2013    Procedure: INSERTION PORT-A-CATH;  Surgeon: Almond Lint, MD;  Location: [facility] OR;  Service: General;  Laterality: Right;  Start Time: 4782.  Marland Kitchen Appendectomy  1980    REVIEW OF SYSTEMS:   General: fatigue (-), night sweats (-), fever (-), pain (-) Lymph: palpable nodes (-) HEENT: vision changes (-), mucositis (-), gum bleeding (-), epistaxis (-) Cardiovascular: chest pain (-), palpitations (-) Pulmonary: shortness of breath (-), dyspnea on exertion (-), cough (-), hemoptysis (-) GI:  Early satiety (-), melena (-), dysphagia (-), nausea/vomiting (-), diarrhea (-) GU: dysuria (-), hematuria (-), incontinence (-) Musculoskeletal: joint swelling (-), joint pain (-), back pain (-) Neuro: weakness (-), numbness (+), headache (-), confusion (-) Skin:  Rash (-), lesions (-), dryness (-) Psych: depression (-), suicidal/homicidal ideation (-), feeling of hopelessness (-)   PHYSICAL EXAMINATION: Blood pressure 115/75, pulse 97, temperature 98.2 F (36.8 C), temperature source Oral, resp. rate 20, height 5\' 2"  (1.575 m), weight 143 lb 4.8 oz (65 kg). Body mass index is 26.2 kg/(m^2). General: Patient is a well appearing female in no acute distress HEENT: PERRLA, sclerae anicteric no conjunctival pallor, MMM Neck: supple, no palpable adenopathy Lungs: clear to auscultation bilaterally, no wheezes, rhonchi, or rales Cardiovascular: regular rate rhythm, S1, S2, no murmurs, rubs or gallops Abdomen: Soft, non-tender, non-distended, normoactive bowel sounds, no HSM Extremities: warm and well perfused, no clubbing, cyanosis, or edema Skin: erythematous lesion on right wrist and right knee Neuro: Non-focal Breasts: left breast lumpectomy site no nodularity, healing well, scar tissue underneath axillary incision, no erythema, swelling, drainage, right breast no masses or nodularity.  ECOG PERFORMANCE STATUS: 1 - Symptomatic but completely ambulatory  LABORATORY DATA: Lab Results  Component Value Date   WBC 3.0* 07/03/2013   HGB 8.8* 07/03/2013   HCT 27.2*  07/03/2013   MCV 95.8 07/03/2013   PLT 120* 07/03/2013      Chemistry      Component Value Date/Time   NA 141 06/26/2013 1351   NA 137 02/05/2013 0505   K 3.0* 06/26/2013 1351   K 3.4* 02/05/2013 0505   CL 100 04/23/2013 1447   CL 102 02/05/2013 0505   CO2 26 06/26/2013 1351   CO2 27 02/05/2013 0505   BUN 10.3 06/26/2013 1351   BUN 8 02/05/2013 0505   CREATININE 0.8 06/26/2013 1351   CREATININE 0.86 02/05/2013 0505      Component Value Date/Time   CALCIUM 9.8 06/26/2013 1351   CALCIUM 8.0* 02/05/2013 0505   ALKPHOS 91 06/26/2013 1351   AST 22 06/26/2013 1351   ALT 29 06/26/2013 1351   BILITOT 0.39 06/26/2013 1351       RADIOGRAPHIC STUDIES:  No results found.  ASSESSMENT: 57 year old female with new diagnosis of   #1 multifocal invasive ductal carcinoma grade 3 ER weakly positive at 3% PR negative HER-2/neu negative with Ki-67 of 90% tumor is very aggressive. She does have a palpable lymph node as well in the left axilla. Patient was seen in the multidisciplinary clinic for discussion of treatment options.   #2 patient underwent a lumpectomy with axillary lymph node dissection with Dr. Luz Brazen on 02/04/13. A 1.6 cm tumor was removed with 1/19 lymph nodes positive for disease.    #3 She was recommended adjuvant chemotherapy consisting of Adriamycin Cytoxan given dose dense for total of 4 cycles followed by Taxol carboplatinum weekly for 12 weeks. Rationale for this aggressive treatment was discussed with the patient since she is essentially has a triple-negative disease.   #4 certainly patient will need post lumpectomy radiation therapy she was seen by radiation oncology. This will begin after patient has completed her chemotherapy.   #5 we discussed role of genetics, and she had an appointment with Maylon Cos on 03/06/13 the results were negative.       PLAN:  1. Doing well. Labs are stable.  The patient will proceed with chemotherapy today.  She is due for week 8 of taxol carbo.    #2  I have  referred the patient to podiatry for evaluation of her bilateral great toes.      3. We will see Ms. Quade back in one week for cycle 9 Taxol/Carbo.  4.Her numbness had improved  on the Gabapentin.  With the new rash however, we will stop this and monitor.    All questions were answered. The patient knows to call the clinic with any problems, questions or concerns. We can certainly see the patient much sooner if necessary.  I spent 25 minutes counseling the patient face to face. The total time spent in the appointment was 30 minutes.  Cherie Ouch Lyn Hollingshead, NP Medical Oncology [facility] Phone: [phone] 07/03/2013, 2:23 PM

## 2013-07-03 NOTE — Patient Instructions (Addendum)
[facility] Discharge Instructions for Patients Receiving Chemotherapy  Today you received the following chemotherapy agents carboplatin, taxol  To help prevent nausea and vomiting after your treatment, we encourage you to take your nausea medication Ativan 1mg  every 3 to 4 hours as needed for nausea or vomiting, Compazine suppositories 25mg  1 per rectum every 6 hours, Compazine 10 mg pills every 6 hours as needed for nausea or vomiting and Zofran 8 mg.   If you develop nausea and vomiting that is not controlled by your nausea medication, call the clinic.   BELOW ARE SYMPTOMS THAT SHOULD BE REPORTED IMMEDIATELY:  *FEVER GREATER THAN 100.5 F  *CHILLS WITH OR WITHOUT FEVER  NAUSEA AND VOMITING THAT IS NOT CONTROLLED WITH YOUR NAUSEA MEDICATION  *UNUSUAL SHORTNESS OF BREATH  *UNUSUAL BRUISING OR BLEEDING  TENDERNESS IN MOUTH AND THROAT WITH OR WITHOUT PRESENCE OF ULCERS  *URINARY PROBLEMS  *BOWEL PROBLEMS  UNUSUAL RASH Items with * indicate a potential emergency and should be followed up as soon as possible.  Feel free to call the clinic you have any questions or concerns. The clinic phone number is [phone].

## 2013-07-03 NOTE — Patient Instructions (Addendum)
Doing well.  Proceed with chemotherapy.  Take 1 tablet of the neuron tin tonight and then stop.  We will see you back in 1 week.  Please call us if you have any questions or concerns.

## 2013-07-04 MED ORDER — TRIAMCINOLONE ACETONIDE 0.5 % EX OINT: TOPICAL_OINTMENT | Freq: Two times a day (BID) | CUTANEOUS | Status: DC

## 2013-07-04 NOTE — Addendum Note (Signed)
Addended by: Augustin Schooling C on: 07/04/2013 04:26 PM   Modules accepted: Orders

## 2013-07-08 ENCOUNTER — Ambulatory Visit: Payer: BC Managed Care – PPO | Admitting: Physical Therapy

## 2013-07-08 ENCOUNTER — Telehealth: Payer: Self-pay | Admitting: Medical Oncology

## 2013-07-08 ENCOUNTER — Other Ambulatory Visit: Payer: Self-pay | Admitting: Medical Oncology

## 2013-07-08 ENCOUNTER — Other Ambulatory Visit: Payer: Self-pay | Admitting: Physician Assistant

## 2013-07-08 MED ORDER — PREDNISONE (PAK) 10 MG PO TABS: ORAL_TABLET | ORAL | Status: DC

## 2013-07-08 NOTE — Telephone Encounter (Signed)
Daughter LVMOM, stating patient "rash has gotten worse, blackish dots on her knee and burns when touched." States stopped neurontin as instructed by NP and continues to use cream to rash site as instructed. Reviewed with Augustin Schooling, NP per NP, patient to start Prednisone 10mg  tablet to be taken as:  60mg  day one, 50mg  day two, 40mg  day three, 30 mg day four, 20 mg day five and 10 mg day 6 for a total of 21 tablets to be dispensed. Informed daughter of prescription and directions on how to take. Prescription sent to pt's listed pharmacy. Daughter verbalized understanding, no further questions at this time.

## 2013-07-10 ENCOUNTER — Encounter: Payer: Self-pay | Admitting: Adult Health

## 2013-07-10 ENCOUNTER — Other Ambulatory Visit (HOSPITAL_BASED_OUTPATIENT_CLINIC_OR_DEPARTMENT_OTHER): Payer: BC Managed Care – PPO | Admitting: Lab

## 2013-07-10 ENCOUNTER — Telehealth: Payer: Self-pay | Admitting: Oncology

## 2013-07-10 ENCOUNTER — Ambulatory Visit (HOSPITAL_BASED_OUTPATIENT_CLINIC_OR_DEPARTMENT_OTHER): Payer: BC Managed Care – PPO | Admitting: Adult Health

## 2013-07-10 ENCOUNTER — Ambulatory Visit (HOSPITAL_BASED_OUTPATIENT_CLINIC_OR_DEPARTMENT_OTHER): Payer: BC Managed Care – PPO

## 2013-07-10 VITALS — BP 161/85 | HR 90 | Temp 98.5°F | Resp 20 | Ht 62.0 in | Wt 141.9 lb

## 2013-07-10 DIAGNOSIS — C50219 Malignant neoplasm of upper-inner quadrant of unspecified female breast: Secondary | ICD-10-CM

## 2013-07-10 DIAGNOSIS — C50212 Malignant neoplasm of upper-inner quadrant of left female breast: Secondary | ICD-10-CM

## 2013-07-10 DIAGNOSIS — R599 Enlarged lymph nodes, unspecified: Secondary | ICD-10-CM

## 2013-07-10 DIAGNOSIS — R21 Rash and other nonspecific skin eruption: Secondary | ICD-10-CM

## 2013-07-10 DIAGNOSIS — Z5111 Encounter for antineoplastic chemotherapy: Secondary | ICD-10-CM

## 2013-07-10 DIAGNOSIS — C50912 Malignant neoplasm of unspecified site of left female breast: Secondary | ICD-10-CM

## 2013-07-10 LAB — COMPREHENSIVE METABOLIC PANEL (CC13)
ALT: 25 U/L (ref 0–55)
Albumin: 3.6 g/dL (ref 3.5–5.0)
Alkaline Phosphatase: 42 U/L (ref 40–150)
Glucose: 146 mg/dl — ABNORMAL HIGH (ref 70–140)
Potassium: 4.5 mEq/L (ref 3.5–5.1)
Sodium: 137 mEq/L (ref 136–145)
Total Protein: 6.2 g/dL — ABNORMAL LOW (ref 6.4–8.3)

## 2013-07-10 LAB — CBC WITH DIFFERENTIAL/PLATELET
Basophils Absolute: 0 10*3/uL (ref 0.0–0.1)
EOS%: 0 % (ref 0.0–7.0)
Eosinophils Absolute: 0 10*3/uL (ref 0.0–0.5)
HGB: 9.5 g/dL — ABNORMAL LOW (ref 11.6–15.9)
MCH: 31.8 pg (ref 25.1–34.0)
NEUT#: 1.6 10*3/uL (ref 1.5–6.5)
RDW: 19.3 % — ABNORMAL HIGH (ref 11.2–14.5)
lymph#: 0.5 10*3/uL — ABNORMAL LOW (ref 0.9–3.3)

## 2013-07-10 MED ORDER — SODIUM CHLORIDE 0.9 % IJ SOLN
10.0000 mL | INTRAMUSCULAR | Status: DC | PRN
  Administered 2013-07-10: 10 mL
  Filled 2013-07-10: qty 10

## 2013-07-10 MED ORDER — DEXAMETHASONE SODIUM PHOSPHATE 20 MG/5ML IJ SOLN
20.0000 mg | Freq: Once | INTRAMUSCULAR | Status: AC
  Administered 2013-07-10: 20 mg via INTRAVENOUS

## 2013-07-10 MED ORDER — FAMOTIDINE IN NACL 20-0.9 MG/50ML-% IV SOLN
20.0000 mg | Freq: Once | INTRAVENOUS | Status: AC
  Administered 2013-07-10: 20 mg via INTRAVENOUS

## 2013-07-10 MED ORDER — DIPHENHYDRAMINE HCL 50 MG/ML IJ SOLN
50.0000 mg | Freq: Once | INTRAMUSCULAR | Status: AC
  Administered 2013-07-10: 50 mg via INTRAVENOUS

## 2013-07-10 MED ORDER — ONDANSETRON 16 MG/50ML IVPB (CHCC)
16.0000 mg | Freq: Once | INTRAVENOUS | Status: AC
  Administered 2013-07-10: 16 mg via INTRAVENOUS

## 2013-07-10 MED ORDER — HEPARIN SOD (PORK) LOCK FLUSH 100 UNIT/ML IV SOLN
500.0000 [IU] | Freq: Once | INTRAVENOUS | Status: AC | PRN
  Administered 2013-07-10: 500 [IU]
  Filled 2013-07-10: qty 5

## 2013-07-10 MED ORDER — CARBOPLATIN CHEMO INJECTION 450 MG/45ML
243.4000 mg | Freq: Once | INTRAVENOUS | Status: AC
  Administered 2013-07-10: 240 mg via INTRAVENOUS
  Filled 2013-07-10: qty 24

## 2013-07-10 MED ORDER — SODIUM CHLORIDE 0.9 % IV SOLN
Freq: Once | INTRAVENOUS | Status: AC
  Administered 2013-07-10: 15:00:00 via INTRAVENOUS

## 2013-07-10 MED ORDER — PACLITAXEL CHEMO INJECTION 300 MG/50ML
80.0000 mg/m2 | Freq: Once | INTRAVENOUS | Status: AC
  Administered 2013-07-10: 138 mg via INTRAVENOUS
  Filled 2013-07-10: qty 23

## 2013-07-10 NOTE — Patient Instructions (Addendum)
[facility] Discharge Instructions for Patients Receiving Chemotherapy  Today you received the following chemotherapy agents:  Taxol and Carboplatin  To help prevent nausea and vomiting after your treatment, we encourage you to take your nausea medication as ordered per MD.   If you develop nausea and vomiting that is not controlled by your nausea medication, call the clinic.   BELOW ARE SYMPTOMS THAT SHOULD BE REPORTED IMMEDIATELY:  *FEVER GREATER THAN 100.5 F  *CHILLS WITH OR WITHOUT FEVER  NAUSEA AND VOMITING THAT IS NOT CONTROLLED WITH YOUR NAUSEA MEDICATION  *UNUSUAL SHORTNESS OF BREATH  *UNUSUAL BRUISING OR BLEEDING  TENDERNESS IN MOUTH AND THROAT WITH OR WITHOUT PRESENCE OF ULCERS  *URINARY PROBLEMS  *BOWEL PROBLEMS  UNUSUAL RASH Items with * indicate a potential emergency and should be followed up as soon as possible.  Feel free to call the clinic you have any questions or concerns. The clinic phone number is [phone].    

## 2013-07-10 NOTE — Progress Notes (Signed)
VO given and read back from Augustin Schooling NP OK to treat today despite Platelets of 91K and WBC of 2.5 (per Dr. Welton Flakes).

## 2013-07-10 NOTE — Progress Notes (Signed)
OFFICE PROGRESS NOTE  CC**  [name],[name], MD [address], [address] [place] [address]  DIAGNOSIS: 57 year old female with stage IIA ER weakly positive, PR negative HER-2/neu negative, invasive ductal carcinoma of the left breast.   PRIOR THERAPY: 1. Earlier this year it on routine screening mammography the patient was noted to have a abnormality within the 12:00 position of the left breast. Patient returned for additional imaging with 3-[name], tomosynthesis. Within the left breast there were 2 masses somewhat lobulated with irregular margins in the upper inner aspect of the left breast. These were separated by approximately a centimeter. On ultrasound the lesion the 11:00 position measured 1.0 x 0.7 cm. The 12:00 lesion measured 1.6 x 0.9 cm. In addition within the axillary region there was a 2.1 x 1.7 suspicious appearing lymph node.. The patient proceeded to undergo ultrasound-guided biopsy of the 2 areas within the left breast as well as the suspicious left axillary lymph node. Biopsies from both breast areas revealed invasive ductal carcinoma. In addition the lymph node from the left axilla revealed ductal carcinoma. The lesions appeared to be high-grade. There was no HER-2/neu amplification and hormone staining was minimal consistent with essentially consistent with triple negative breast cancer.  Patient's case was discussed at the multidisciplinary breast conference her pathology and radiology were reviewed.  2. Patient underwent lumpectomy of the left breast with Dr. Donell Beers on 02/04/13, a 1.6cm tumor was removed along with an axillary lymph node dissection that found 1/19 nodes positive for metastatic disease.    3. Patient was then started on adjuvant chemotherapy on 03/05/13 with Adriamycin/Cytoxan. She began Taxol Carbo on 05/07/13.  CURRENT THERAPY: Taxol Carbo week 9  INTERVAL HISTORY: LYNE KHURANA 57 y.o. female returns for evaluation prior to Taxol Carbo.   She is doing  well today.   Last week she reported a rash on her knee and her wrist and was prescribed triamcinolone cream along with instructing her to stop the neurontin she had started taking.  She called earlier this week saying the rash had worsened and she was prescribed a steroid taper.  The rash has since improved.  She also went to podiatry and had both of her great toe nails removed.  Otherwise, she denies fevers, chills, nausea, vomiting, constipation, diarrhea.  Her numbness is stable.    MEDICAL HISTORY: Past Medical History  Diagnosis Date  . Anxiety   . GERD (gastroesophageal reflux disease)   . Hypertension   . Breast cancer   . MRSA (methicillin resistant Staphylococcus aureus) 2009    ALLERGIES:  has No Known Allergies.  MEDICATIONS:  Current Outpatient Prescriptions  Medication Sig Dispense Refill  . ALPRAZolam (XANAX) 1 MG tablet Take 2 mg by mouth at bedtime as needed for sleep.       Marland Kitchen amphetamine-dextroamphetamine (ADDERALL) 10 MG tablet Take 20 mg by mouth daily.       . B Complex-C (SUPER B COMPLEX PO) Take 1 tablet by mouth daily.      . ciprofloxacin (CIPRO) 500 MG tablet Take 1 tablet (500 mg total) by mouth 2 (two) times daily.  14 tablet  6  . COMPRO 25 MG suppository       . dexamethasone (DECADRON) 4 MG tablet       . dexamethasone (DECADRON) 4 MG tablet Take 2 tablets (8 mg total) by mouth 2 (two) times daily with a meal. Take two times a day starting the day after chemotherapy for 3 days.  30 tablet  1  . escitalopram (LEXAPRO) 20 MG tablet Take 40 mg by mouth daily.       Marland Kitchen esomeprazole (NEXIUM) 40 MG capsule Take 40 mg by mouth daily as needed. For reflux      . gabapentin (NEURONTIN) 100 MG capsule Take 1 capsule (100 mg total) by mouth 3 (three) times daily.  90 capsule  2  . HYDROcodone-acetaminophen (NORCO/VICODIN) 5-325 MG per tablet       . lidocaine-prilocaine (EMLA) cream Apply topically as needed.  30 g  7  . LORazepam (ATIVAN) 0.5 MG tablet Take 1 tablet  (0.5 mg total) by mouth every 6 (six) hours as needed for anxiety.  30 tablet  0  . LORazepam (ATIVAN) 0.5 MG tablet Take 1 tablet (0.5 mg total) by mouth every 6 (six) hours as needed (Nausea or vomiting).  30 tablet  0  . Multiple Vitamin (MULTIVITAMIN) tablet Take 1 tablet by mouth daily.      . Omega-3 Fatty Acids (FISH OIL PO) Take 1 capsule by mouth daily.      . ondansetron (ZOFRAN) 8 MG tablet       . ondansetron (ZOFRAN) 8 MG tablet Take 1 tablet (8 mg total) by mouth 2 (two) times daily. Take two times a day starting the day after chemo for 3 days. Then take two times a day as needed for nausea or vomiting.  30 tablet  1  . OVER THE COUNTER MEDICATION Take by mouth as needed.      Marland Kitchen OVER THE COUNTER MEDICATION Take by mouth as needed. OTC stool softner, as needed      . potassium chloride SA (K-DUR,KLOR-CON) 20 MEQ tablet Take 1 tablet (20 mEq total) by mouth 3 (three) times daily. For 5 days then as directed.  60 tablet  0  . predniSONE (STERAPRED UNI-PAK) 10 MG tablet Take 60 mg (6 tablets) day one, 50 mg (5 tablets) day two, 40 mg (4 tablets) day three, 30 mg (3 tablets) day four, 20 mg (2 tablets) day five, 10 mg (1 tablet) day 6.  21 tablet  0  . prochlorperazine (COMPAZINE) 10 MG tablet       . prochlorperazine (COMPAZINE) 10 MG tablet Take 1 tablet (10 mg total) by mouth every 6 (six) hours as needed (Nausea or vomiting).  30 tablet  1  . prochlorperazine (COMPAZINE) 25 MG suppository Place 1 suppository (25 mg total) rectally every 12 (twelve) hours as needed for nausea.  12 suppository  3  . triamcinolone ointment (KENALOG) 0.5 % Apply topically 2 (two) times daily.  30 g  0  . valsartan-hydrochlorothiazide (DIOVAN-HCT) 320-25 MG per tablet Take 1 tablet by mouth daily.        No current facility-administered medications for this visit.    SURGICAL HISTORY:  Past Surgical History  Procedure Laterality Date  . Anal sphincterotomy  04/2011  . Hemorrhoid surgery  04/2011     ligation  . Breast lumpectomy with needle localization Left 02/04/2013    Procedure: LEFT BREAST LUMPECTOMY WITH NEEDLE LOCALIZATION;  Surgeon: Almond Lint, MD;  Location: [facility] OR;  Service: General;  Laterality: Left;  . Axillary lymph node dissection Left 02/04/2013    Procedure: LEFT AXILLARY LYMPH NODE DISSECTION;  Surgeon: Almond Lint, MD;  Location: [facility] OR;  Service: General;  Laterality: Left;  End: 1512  . Portacath placement Right 02/04/2013    Procedure: INSERTION PORT-A-CATH;  Surgeon: Almond Lint, MD;  Location: [facility] OR;  Service:  General;  Laterality: Right;  Start Time: 1538.  Marland Kitchen Appendectomy  1980    REVIEW OF SYSTEMS:   General: fatigue (-), night sweats (-), fever (-), pain (-) Lymph: palpable nodes (-) HEENT: vision changes (-), mucositis (-), gum bleeding (-), epistaxis (-) Cardiovascular: chest pain (-), palpitations (-) Pulmonary: shortness of breath (-), dyspnea on exertion (-), cough (-), hemoptysis (-) GI:  Early satiety (-), melena (-), dysphagia (-), nausea/vomiting (-), diarrhea (-) GU: dysuria (-), hematuria (-), incontinence (-) Musculoskeletal: joint swelling (-), joint pain (-), back pain (-) Neuro: weakness (-), numbness (+), headache (-), confusion (-) Skin: Rash (-), lesions (-), dryness (-) Psych: depression (-), suicidal/homicidal ideation (-), feeling of hopelessness (-)   PHYSICAL EXAMINATION: Blood pressure 161/85, pulse 90, temperature 98.5 F (36.9 C), temperature source Oral, resp. rate 20, height 5\' 2"  (1.575 m), weight 141 lb 14.4 oz (64.365 kg). Body mass index is 25.95 kg/(m^2). General: Patient is a well appearing female in no acute distress HEENT: PERRLA, sclerae anicteric no conjunctival pallor, MMM Neck: supple, no palpable adenopathy Lungs: clear to auscultation bilaterally, no wheezes, rhonchi, or rales Cardiovascular: regular rate rhythm, S1, S2, no murmurs, rubs or gallops Abdomen: Soft, non-tender, non-distended, normoactive bowel sounds,  no HSM Extremities: warm and well perfused, no clubbing, cyanosis, or edema Skin: erythematous lesion on right wrist and right knee Neuro: Non-focal Breasts: left breast lumpectomy site no nodularity, healing well, scar tissue underneath axillary incision, no erythema, swelling, drainage, right breast no masses or nodularity.  ECOG PERFORMANCE STATUS: 1 - Symptomatic but completely ambulatory  LABORATORY DATA: Lab Results  Component Value Date   WBC 2.5* 07/10/2013   HGB 9.5* 07/10/2013   HCT 28.5* 07/10/2013   MCV 95.3 07/10/2013   PLT 91* 07/10/2013      Chemistry      Component Value Date/Time   NA 137 07/10/2013 1324   NA 137 02/05/2013 0505   K 4.5 07/10/2013 1324   K 3.4* 02/05/2013 0505   CL 100 04/23/2013 1447   CL 102 02/05/2013 0505   CO2 26 07/10/2013 1324   CO2 27 02/05/2013 0505   BUN 19.5 07/10/2013 1324   BUN 8 02/05/2013 0505   CREATININE 0.8 07/10/2013 1324   CREATININE 0.86 02/05/2013 0505      Component Value Date/Time   CALCIUM 10.1 07/10/2013 1324   CALCIUM 8.0* 02/05/2013 0505   ALKPHOS 42 07/10/2013 1324   AST 15 07/10/2013 1324   ALT 25 07/10/2013 1324   BILITOT 0.32 07/10/2013 1324       RADIOGRAPHIC STUDIES:  No results found.  ASSESSMENT: 57 year old female with new diagnosis of   #1 multifocal invasive ductal carcinoma grade 3 ER weakly positive at 3% PR negative HER-2/neu negative with Ki-67 of 90% tumor is very aggressive. She does have a palpable lymph node as well in the left axilla. Patient was seen in the multidisciplinary clinic for discussion of treatment options.   #2 patient underwent a lumpectomy with axillary lymph node dissection with Dr. Luz Brazen on 02/04/13. A 1.6 cm tumor was removed with 1/19 lymph nodes positive for disease.    #3 She was recommended adjuvant chemotherapy consisting of Adriamycin Cytoxan given dose dense for total of 4 cycles followed by Taxol carboplatinum weekly for 12 weeks. Rationale for this aggressive treatment was discussed with the patient  since she is essentially has a triple-negative disease.   #4 certainly patient will need post lumpectomy radiation therapy she was seen by radiation oncology. This  will begin after patient has completed her chemotherapy.   #5 we discussed role of genetics, and she had an appointment with Maylon Cos on 03/06/13 the results were negative.       PLAN:  1. Doing well. Labs are stable.  The patient will proceed with chemotherapy today.  She is due for week 9 of taxol carbo.    #2  She has had her great toe nails removed bilaterally.  She is recovering.     3. We will see Ms. Azbell back in one week for cycle 10 Taxol/Carbo.  4.We will restart the Gabapentin as the rash continued to worsen in its absence.    5. For the rash she is currently taking a steroid taper.  She will continue this and f/u with dermatology ASAP.  She has changed her detergent and lotion, so I told her to avoid these agents.    All questions were answered. The patient knows to call the clinic with any problems, questions or concerns. We can certainly see the patient much sooner if necessary.  I spent 25 minutes counseling the patient face to face. The total time spent in the appointment was 30 minutes.  Cherie Ouch Lyn Hollingshead, NP Medical Oncology [facility] Phone: [phone] 07/12/2013, 7:34 AM

## 2013-07-10 NOTE — Patient Instructions (Signed)
Doing well.  Proceed with chemotherapy.  Return to your normal detergent and lotion.  We will set you up with dermatology.  Please call us if you have any questions or concerns.

## 2013-07-11 ENCOUNTER — Encounter: Payer: Self-pay | Admitting: Oncology

## 2013-07-11 NOTE — Progress Notes (Signed)
Put fmla form on nurse's desk °

## 2013-07-15 ENCOUNTER — Encounter: Payer: Self-pay | Admitting: Oncology

## 2013-07-15 NOTE — Progress Notes (Signed)
Faxed fmla form to [employer] @ [phone] °

## 2013-07-17 ENCOUNTER — Ambulatory Visit (HOSPITAL_BASED_OUTPATIENT_CLINIC_OR_DEPARTMENT_OTHER): Payer: BC Managed Care – PPO | Admitting: Adult Health

## 2013-07-17 ENCOUNTER — Telehealth: Payer: Self-pay | Admitting: *Deleted

## 2013-07-17 ENCOUNTER — Encounter: Payer: Self-pay | Admitting: Adult Health

## 2013-07-17 ENCOUNTER — Other Ambulatory Visit (HOSPITAL_BASED_OUTPATIENT_CLINIC_OR_DEPARTMENT_OTHER): Payer: BC Managed Care – PPO | Admitting: Lab

## 2013-07-17 ENCOUNTER — Ambulatory Visit: Payer: BC Managed Care – PPO

## 2013-07-17 ENCOUNTER — Telehealth: Payer: Self-pay | Admitting: Oncology

## 2013-07-17 VITALS — BP 128/78 | HR 124 | Temp 98.4°F | Resp 18 | Ht 62.0 in | Wt 139.5 lb

## 2013-07-17 DIAGNOSIS — C50912 Malignant neoplasm of unspecified site of left female breast: Secondary | ICD-10-CM

## 2013-07-17 DIAGNOSIS — C50219 Malignant neoplasm of upper-inner quadrant of unspecified female breast: Secondary | ICD-10-CM

## 2013-07-17 DIAGNOSIS — C50212 Malignant neoplasm of upper-inner quadrant of left female breast: Secondary | ICD-10-CM

## 2013-07-17 LAB — COMPREHENSIVE METABOLIC PANEL (CC13)
ALT: 42 U/L (ref 0–55)
AST: 22 U/L (ref 5–34)
Alkaline Phosphatase: 42 U/L (ref 40–150)
BUN: 18.8 mg/dL (ref 7.0–26.0)
Calcium: 9.5 mg/dL (ref 8.4–10.4)
Chloride: 102 mEq/L (ref 98–109)
Creatinine: 0.8 mg/dL (ref 0.6–1.1)
Total Bilirubin: 0.38 mg/dL (ref 0.20–1.20)

## 2013-07-17 LAB — CBC WITH DIFFERENTIAL/PLATELET
BASO%: 0.2 % (ref 0.0–2.0)
Basophils Absolute: 0 10*3/uL (ref 0.0–0.1)
EOS%: 0.1 % (ref 0.0–7.0)
HCT: 30.3 % — ABNORMAL LOW (ref 34.8–46.6)
HGB: 10 g/dL — ABNORMAL LOW (ref 11.6–15.9)
MCH: 32.2 pg (ref 25.1–34.0)
MCHC: 33 g/dL (ref 31.5–36.0)
MCV: 97.6 fL (ref 79.5–101.0)
MONO%: 9.6 % (ref 0.0–14.0)
NEUT%: 48.8 % (ref 38.4–76.8)
lymph#: 1.1 10*3/uL (ref 0.9–3.3)

## 2013-07-17 NOTE — Telephone Encounter (Signed)
Per staff message and POF I have scheduled appts.  JMW  

## 2013-07-17 NOTE — Patient Instructions (Addendum)
Gemcitabine injection What is this medicine? GEMCITABINE (jem SIT a been) is a chemotherapy drug. This medicine is used to treat many types of cancer like breast cancer, lung cancer, pancreatic cancer, and ovarian cancer. This medicine may be used for other purposes; ask your health care provider or pharmacist if you have questions. What should I tell my health care provider before I take this medicine? They need to know if you have any of these conditions: -blood disorders -infection -kidney disease -liver disease -recent or ongoing radiation therapy -an unusual or allergic reaction to gemcitabine, other chemotherapy, other medicines, foods, dyes, or preservatives -pregnant or trying to get pregnant -breast-feeding How should I use this medicine? This drug is given as an infusion into a vein. It is administered in a hospital or clinic by a specially trained health care professional. Talk to your pediatrician regarding the use of this medicine in children. Special care may be needed. Overdosage: If you think you have taken too much of this medicine contact a poison control center or emergency room at once. NOTE: This medicine is only for you. Do not share this medicine with others. What if I miss a dose? It is important not to miss your dose. Call your doctor or health care professional if you are unable to keep an appointment. What may interact with this medicine? -medicines to increase blood counts like filgrastim, pegfilgrastim, sargramostim -some other chemotherapy drugs like cisplatin -vaccines Talk to your doctor or health care professional before taking any of these medicines: -acetaminophen -aspirin -ibuprofen -ketoprofen -naproxen This list may not describe all possible interactions. Give your health care provider a list of all the medicines, herbs, non-prescription drugs, or dietary supplements you use. Also tell them if you smoke, drink alcohol, or use illegal drugs. Some  items may interact with your medicine. What should I watch for while using this medicine? Visit your doctor for checks on your progress. This drug may make you feel generally unwell. This is not uncommon, as chemotherapy can affect healthy cells as well as cancer cells. Report any side effects. Continue your course of treatment even though you feel ill unless your doctor tells you to stop. In some cases, you may be given additional medicines to help with side effects. Follow all directions for their use. Call your doctor or health care professional for advice if you get a fever, chills or sore throat, or other symptoms of a cold or flu. Do not treat yourself. This drug decreases your body's ability to fight infections. Try to avoid being around people who are sick. This medicine may increase your risk to bruise or bleed. Call your doctor or health care professional if you notice any unusual bleeding. Be careful brushing and flossing your teeth or using a toothpick because you may get an infection or bleed more easily. If you have any dental work done, tell your dentist you are receiving this medicine. Avoid taking products that contain aspirin, acetaminophen, ibuprofen, naproxen, or ketoprofen unless instructed by your doctor. These medicines may hide a fever. Women should inform their doctor if they wish to become pregnant or think they might be pregnant. There is a potential for serious side effects to an unborn child. Talk to your health care professional or pharmacist for more information. Do not breast-feed an infant while taking this medicine. What side effects may I notice from receiving this medicine? Side effects that you should report to your doctor or health care professional as soon as possible: -allergic   reactions like skin rash, itching or hives, swelling of the face, lips, or tongue -low blood counts - this medicine may decrease the number of white blood cells, red blood cells and  platelets. You may be at increased risk for infections and bleeding. -signs of infection - fever or chills, cough, sore throat, pain or difficulty passing urine -signs of decreased platelets or bleeding - bruising, pinpoint red spots on the skin, black, tarry stools, blood in the urine -signs of decreased red blood cells - unusually weak or tired, fainting spells, lightheadedness -breathing problems -chest pain -mouth sores -nausea and vomiting -pain, swelling, redness at site where injected -pain, tingling, numbness in the hands or feet -stomach pain -swelling of ankles, feet, hands -unusual bleeding Side effects that usually do not require medical attention (report to your doctor or health care professional if they continue or are bothersome): -constipation -diarrhea -hair loss -loss of appetite -stomach upset This list may not describe all possible side effects. Call your doctor for medical advice about side effects. You may report side effects to FDA at 1-800-FDA-1088. Where should I keep my medicine? This drug is given in a hospital or clinic and will not be stored at home. NOTE: This sheet is a summary. It may not cover all possible information. If you have questions about this medicine, talk to your doctor, pharmacist, or health care provider.  2013, [employer]. (03/03/2008 6:45:54 PM) Carboplatin injection What is this medicine? CARBOPLATIN (KAR boe pla tin) is a chemotherapy drug. It targets fast dividing cells, like cancer cells, and causes these cells to die. This medicine is used to treat ovarian cancer and many other cancers. This medicine may be used for other purposes; ask your health care provider or pharmacist if you have questions. What should I tell my health care provider before I take this medicine? They need to know if you have any of these conditions: -blood disorders -hearing problems -kidney disease -recent or ongoing radiation therapy -an  unusual or allergic reaction to carboplatin, cisplatin, other chemotherapy, other medicines, foods, dyes, or preservatives -pregnant or trying to get pregnant -breast-feeding How should I use this medicine? This drug is usually given as an infusion into a vein. It is administered in a hospital or clinic by a specially trained health care professional. Talk to your pediatrician regarding the use of this medicine in children. Special care may be needed. Overdosage: If you think you have taken too much of this medicine contact a poison control center or emergency room at once. NOTE: This medicine is only for you. Do not share this medicine with others. What if I miss a dose? It is important not to miss a dose. Call your doctor or health care professional if you are unable to keep an appointment. What may interact with this medicine? -medicines for seizures -medicines to increase blood counts like filgrastim, pegfilgrastim, sargramostim -some antibiotics like amikacin, gentamicin, neomycin, streptomycin, tobramycin -vaccines Talk to your doctor or health care professional before taking any of these medicines: -acetaminophen -aspirin -ibuprofen -ketoprofen -naproxen This list may not describe all possible interactions. Give your health care provider a list of all the medicines, herbs, non-prescription drugs, or dietary supplements you use. Also tell them if you smoke, drink alcohol, or use illegal drugs. Some items may interact with your medicine. What should I watch for while using this medicine? Your condition will be monitored carefully while you are receiving this medicine. You will need important blood work done while you are   taking this medicine. This drug may make you feel generally unwell. This is not uncommon, as chemotherapy can affect healthy cells as well as cancer cells. Report any side effects. Continue your course of treatment even though you feel ill unless your doctor tells you to  stop. In some cases, you may be given additional medicines to help with side effects. Follow all directions for their use. Call your doctor or health care professional for advice if you get a fever, chills or sore throat, or other symptoms of a cold or flu. Do not treat yourself. This drug decreases your body's ability to fight infections. Try to avoid being around people who are sick. This medicine may increase your risk to bruise or bleed. Call your doctor or health care professional if you notice any unusual bleeding. Be careful brushing and flossing your teeth or using a toothpick because you may get an infection or bleed more easily. If you have any dental work done, tell your dentist you are receiving this medicine. Avoid taking products that contain aspirin, acetaminophen, ibuprofen, naproxen, or ketoprofen unless instructed by your doctor. These medicines may hide a fever. Do not become pregnant while taking this medicine. Women should inform their doctor if they wish to become pregnant or think they might be pregnant. There is a potential for serious side effects to an unborn child. Talk to your health care professional or pharmacist for more information. Do not breast-feed an infant while taking this medicine. What side effects may I notice from receiving this medicine? Side effects that you should report to your doctor or health care professional as soon as possible: -allergic reactions like skin rash, itching or hives, swelling of the face, lips, or tongue -signs of infection - fever or chills, cough, sore throat, pain or difficulty passing urine -signs of decreased platelets or bleeding - bruising, pinpoint red spots on the skin, black, tarry stools, nosebleeds -signs of decreased red blood cells - unusually weak or tired, fainting spells, lightheadedness -breathing problems -changes in hearing -changes in vision -chest pain -high blood pressure -low blood counts - This drug may  decrease the number of white blood cells, red blood cells and platelets. You may be at increased risk for infections and bleeding. -nausea and vomiting -pain, swelling, redness or irritation at the injection site -pain, tingling, numbness in the hands or feet -problems with balance, talking, walking -trouble passing urine or change in the amount of urine Side effects that usually do not require medical attention (report to your doctor or health care professional if they continue or are bothersome): -hair loss -loss of appetite -metallic taste in the mouth or changes in taste This list may not describe all possible side effects. Call your doctor for medical advice about side effects. You may report side effects to FDA at 1-800-FDA-1088. Where should I keep my medicine? This drug is given in a hospital or clinic and will not be stored at home. NOTE: This sheet is a summary. It may not cover all possible information. If you have questions about this medicine, talk to your doctor, pharmacist, or health care provider.  2012, [employer] Standard. (01/28/2008 2:38:05 PM) 

## 2013-07-17 NOTE — Progress Notes (Addendum)
OFFICE PROGRESS NOTE  CC**  [name],[name], MD [address] [place] [address]  DIAGNOSIS: 57 year old female with stage IIA ER weakly positive, PR negative HER-2/neu negative, invasive ductal carcinoma of the left breast.   PRIOR THERAPY: 1. Earlier this year it on routine screening mammography the patient was noted to have a abnormality within the 12:00 position of the left breast. Patient returned for additional imaging with 3-D, tomosynthesis. Within the left breast there were 2 masses somewhat lobulated with irregular margins in the upper inner aspect of the left breast. These were separated by approximately a centimeter. On ultrasound the lesion the 11:00 position measured 1.0 x 0.7 cm. The 12:00 lesion measured 1.6 x 0.9 cm. In addition within the axillary region there was a 2.1 x 1.7 suspicious appearing lymph node.. The patient proceeded to undergo ultrasound-guided biopsy of the 2 areas within the left breast as well as the suspicious left axillary lymph node. Biopsies from both breast areas revealed invasive ductal carcinoma. In addition the lymph node from the left axilla revealed ductal carcinoma. The lesions appeared to be high-grade. There was no HER-2/neu amplification and hormone staining was minimal consistent with essentially consistent with triple negative breast cancer.  Patient's case was discussed at the multidisciplinary breast conference her pathology and radiology were reviewed.  2. Patient underwent lumpectomy of the left breast with Dr. Donell Beers on 02/04/13, a 1.6cm tumor was removed along with an axillary lymph node dissection that found 1/19 nodes positive for metastatic disease.    3. Patient was then started on adjuvant chemotherapy on 03/05/13 with Adriamycin/Cytoxan. She began Taxol Carbo on 05/07/13.  CURRENT THERAPY: Taxol Carbo week 10  INTERVAL HISTORY: Kristi [name] 57 y.o. female returns for evaluation prior to Taxol Carbo.   She had a  rash that started two weeks ago, and it had worsened to the point of requiring a steroid taper.  It has improved since then, however she has been to [facility].  She is very tearful today.  She denies fevers, chills, nausea, vomiting, constipation, diarrhea.  Her numbness is stable on the Gabapentin.  Her toes are improving from bilateral great toenail removal.  Otherwise, she is w/o questions or concerns.      MEDICAL HISTORY: Past Medical History  Diagnosis Date  . Anxiety   . GERD (gastroesophageal reflux disease)   . Hypertension   . Breast cancer   . MRSA (methicillin resistant Staphylococcus aureus) 2009    ALLERGIES:  has No Known Allergies.  MEDICATIONS:  Current Outpatient Prescriptions  Medication Sig Dispense Refill  . ALPRAZolam (XANAX) 1 MG tablet Take 2 mg by mouth at bedtime as needed for sleep.       Marland Kitchen amphetamine-dextroamphetamine (ADDERALL) 10 MG tablet Take 20 mg by mouth daily.       . B Complex-C (SUPER B COMPLEX PO) Take 1 tablet by mouth daily.      . ciprofloxacin (CIPRO) 500 MG tablet Take 1 tablet (500 mg total) by mouth 2 (two) times daily.  14 tablet  6  . COMPRO 25 MG suppository       . dexamethasone (DECADRON) 4 MG tablet       . dexamethasone (DECADRON) 4 MG tablet Take 2 tablets (8 mg total) by mouth 2 (two) times daily with a meal. Take two times a day starting the day after chemotherapy for 3 days.  30 tablet  1  . escitalopram (LEXAPRO) 20 MG tablet Take  40 mg by mouth daily.       Marland Kitchen esomeprazole (NEXIUM) 40 MG capsule Take 40 mg by mouth daily as needed. For reflux      . gabapentin (NEURONTIN) 100 MG capsule Take 1 capsule (100 mg total) by mouth 3 (three) times daily.  90 capsule  2  . HYDROcodone-acetaminophen (NORCO/VICODIN) 5-325 MG per tablet       . lidocaine-prilocaine (EMLA) cream Apply topically as needed.  30 g  7  . LORazepam (ATIVAN) 0.5 MG tablet Take 1 tablet (0.5 mg total) by mouth every 6 (six) hours as needed for anxiety.   30 tablet  0  . LORazepam (ATIVAN) 0.5 MG tablet Take 1 tablet (0.5 mg total) by mouth every 6 (six) hours as needed (Nausea or vomiting).  30 tablet  0  . Multiple Vitamin (MULTIVITAMIN) tablet Take 1 tablet by mouth daily.      . Omega-3 Fatty Acids (FISH OIL PO) Take 1 capsule by mouth daily.      . ondansetron (ZOFRAN) 8 MG tablet       . ondansetron (ZOFRAN) 8 MG tablet Take 1 tablet (8 mg total) by mouth 2 (two) times daily. Take two times a day starting the day after chemo for 3 days. Then take two times a day as needed for nausea or vomiting.  30 tablet  1  . OVER THE COUNTER MEDICATION Take by mouth as needed.      Marland Kitchen OVER THE COUNTER MEDICATION Take by mouth as needed. OTC stool softner, as needed      . potassium chloride SA (K-DUR,KLOR-CON) 20 MEQ tablet Take 1 tablet (20 mEq total) by mouth 3 (three) times daily. For 5 days then as directed.  60 tablet  0  . predniSONE (STERAPRED UNI-PAK) 10 MG tablet Take 60 mg (6 tablets) day one, 50 mg (5 tablets) day two, 40 mg (4 tablets) day three, 30 mg (3 tablets) day four, 20 mg (2 tablets) day five, 10 mg (1 tablet) day 6.  21 tablet  0  . prochlorperazine (COMPAZINE) 10 MG tablet       . prochlorperazine (COMPAZINE) 10 MG tablet Take 1 tablet (10 mg total) by mouth every 6 (six) hours as needed (Nausea or vomiting).  30 tablet  1  . prochlorperazine (COMPAZINE) 25 MG suppository Place 1 suppository (25 mg total) rectally every 12 (twelve) hours as needed for nausea.  12 suppository  3  . triamcinolone ointment (KENALOG) 0.5 % Apply topically 2 (two) times daily.  30 g  0  . valsartan-hydrochlorothiazide (DIOVAN-HCT) 320-25 MG per tablet Take 1 tablet by mouth daily.        No current facility-administered medications for this visit.    SURGICAL HISTORY:  Past Surgical History  Procedure Laterality Date  . Anal sphincterotomy  04/2011  . Hemorrhoid surgery  04/2011    ligation  . Breast lumpectomy with needle localization Left 02/04/2013     Procedure: LEFT BREAST LUMPECTOMY WITH NEEDLE LOCALIZATION;  Surgeon: Almond Lint, MD;  Location: [facility] OR;  Service: General;  Laterality: Left;  . Axillary lymph node dissection Left 02/04/2013    Procedure: LEFT AXILLARY LYMPH NODE DISSECTION;  Surgeon: Almond Lint, MD;  Location: [facility] OR;  Service: General;  Laterality: Left;  End: 1512  . Portacath placement Right 02/04/2013    Procedure: INSERTION PORT-A-CATH;  Surgeon: Almond Lint, MD;  Location: [facility] OR;  Service: General;  Laterality: Right;  Start Time: 2130.  Marland Kitchen Appendectomy  1980    REVIEW OF SYSTEMS:   A 10 point review of systems was conducted and is otherwise negative except for what is noted above.    PHYSICAL EXAMINATION: Blood pressure 128/78, pulse 124, temperature 98.4 F (36.9 C), temperature source Oral, resp. rate 18, height 5\' 2"  (1.575 m), weight 139 lb 8 oz (63.277 kg). Body mass index is 25.51 kg/(m^2). General: Patient is a well appearing female in no acute distress HEENT: PERRLA, sclerae anicteric no conjunctival pallor, MMM Neck: supple, no palpable adenopathy Lungs: clear to auscultation bilaterally, no wheezes, rhonchi, or rales Cardiovascular: regular rate rhythm, S1, S2, no murmurs, rubs or gallops Abdomen: Soft, non-tender, non-distended, normoactive bowel sounds, no HSM Extremities: warm and well perfused, no clubbing, cyanosis, or edema,  Skin: erythematous lesion on right wrist and right knee, drying, and now appearing as hyperpigmented skin.  Finger Nails are changing color and darkening Neuro: Non-focal Breasts: left breast lumpectomy site no nodularity, healing well, scar tissue underneath axillary incision, no erythema, swelling, drainage, right breast no masses or nodularity.  ECOG PERFORMANCE STATUS: 1 - Symptomatic but completely ambulatory  LABORATORY DATA: Lab Results  Component Value Date   WBC 2.7* 07/17/2013   HGB 10.0* 07/17/2013   HCT 30.3* 07/17/2013   MCV 97.6 07/17/2013   PLT 71*  07/17/2013      Chemistry      Component Value Date/Time   NA 137 07/10/2013 1324   NA 137 02/05/2013 0505   K 4.5 07/10/2013 1324   K 3.4* 02/05/2013 0505   CL 100 04/23/2013 1447   CL 102 02/05/2013 0505   CO2 26 07/10/2013 1324   CO2 27 02/05/2013 0505   BUN 19.5 07/10/2013 1324   BUN 8 02/05/2013 0505   CREATININE 0.8 07/10/2013 1324   CREATININE 0.86 02/05/2013 0505      Component Value Date/Time   CALCIUM 10.1 07/10/2013 1324   CALCIUM 8.0* 02/05/2013 0505   ALKPHOS 42 07/10/2013 1324   AST 15 07/10/2013 1324   ALT 25 07/10/2013 1324   BILITOT 0.32 07/10/2013 1324       RADIOGRAPHIC STUDIES:  No results found.  ASSESSMENT: 57 year old female with new diagnosis of   #1 multifocal invasive ductal carcinoma grade 3 ER weakly positive at 3% PR negative HER-2/neu negative with Ki-67 of 90% tumor is very aggressive. She did have a palpable lymph node as well in the left axilla. Patient was seen in the multidisciplinary clinic for discussion of treatment options.   #2 patient underwent a lumpectomy with axillary lymph node dissection with Dr. Luz Brazen on 02/04/13. A 1.6 cm tumor was removed with 1/19 lymph nodes positive for disease.    #3 She was recommended adjuvant chemotherapy consisting of Adriamycin Cytoxan given dose dense for total of 4 cycles followed by Taxol carboplatinum weekly for 12 weeks. Rationale for this aggressive treatment was discussed with the patient since she is essentially has a triple-negative disease.  She received 4 cycles of Adriamycin/Cytoxan, and 9 cycles of Taxol/Carbo.    #4 certainly patient will need post lumpectomy radiation therapy she was seen by radiation oncology. This will begin after patient has completed her chemotherapy.   #5 we discussed role of genetics, and she had an appointment with Maylon Cos on 03/06/13 the results were negative.       PLAN:  1. Doing well. She is thrombocytopenic today.  Her plt count is 71.  She will not receive cycle 10 of chemotherapy  today due to her  labs.    #2  In regards to the rash, it is no worse, however it has remained.  She completed the steroid taper.  It is likely due to the Taxol.  Patient will likely need 2 cycles of Gemzar carboplatin starting next week.  I gave the patient information on Gemzar Carbo in her AVS.    #3 Patient will return next week for labs and eval for treatment.    All questions were answered. The patient knows to call the clinic with any problems, questions or concerns. We can certainly see the patient much sooner if necessary.  I spent 25 minutes counseling the patient face to face. The total time spent in the appointment was 30 minutes.  Cherie Ouch Lyn Hollingshead, NP Medical Oncology [facility] Phone: [phone]/09/2013, 1:30 PM  ATTENDING'S ATTESTATION:  I personally reviewed patient's chart, examined patient myself, formulated the treatment plan as followed.   Patient with stage II a essentially triple negative breast cancer. She is receiving Taxol carboplatinum able to tolerate it. I have recommended we changed the Taxol to Gemzar and carboplatinum. Hopefully this will help some of the side effects that she is having secondary to the taxanes. Her skin is quite impressive from the rash that she has was gotten. She'll be seen back in one week's time for followup.  Drue Second, MD Medical/Oncology [facility] [phone] (beeper) [phone] (Office)  07/27/2013, 11:04 PM

## 2013-07-17 NOTE — Telephone Encounter (Signed)
, °

## 2013-07-18 ENCOUNTER — Other Ambulatory Visit: Payer: Self-pay

## 2013-07-18 ENCOUNTER — Other Ambulatory Visit (HOSPITAL_COMMUNITY): Payer: BC Managed Care – PPO

## 2013-07-18 ENCOUNTER — Emergency Department (HOSPITAL_COMMUNITY): Payer: BC Managed Care – PPO

## 2013-07-18 ENCOUNTER — Telehealth: Payer: Self-pay | Admitting: *Deleted

## 2013-07-18 ENCOUNTER — Encounter (HOSPITAL_COMMUNITY): Payer: Self-pay | Admitting: *Deleted

## 2013-07-18 ENCOUNTER — Inpatient Hospital Stay (HOSPITAL_COMMUNITY): Payer: BC Managed Care – PPO

## 2013-07-18 ENCOUNTER — Inpatient Hospital Stay (HOSPITAL_COMMUNITY)
Admission: EM | Admit: 2013-07-18 | Discharge: 2013-07-20 | DRG: 014 | Disposition: A | Discharge status: Home / Self Care | Payer: BC Managed Care – PPO | Attending: Internal Medicine | Admitting: Internal Medicine

## 2013-07-18 DIAGNOSIS — F329 Major depressive disorder, single episode, unspecified: Secondary | ICD-10-CM | POA: Diagnosis present

## 2013-07-18 DIAGNOSIS — D638 Anemia in other chronic diseases classified elsewhere: Secondary | ICD-10-CM | POA: Diagnosis present

## 2013-07-18 DIAGNOSIS — M7989 Other specified soft tissue disorders: Secondary | ICD-10-CM

## 2013-07-18 DIAGNOSIS — F3289 Other specified depressive episodes: Secondary | ICD-10-CM | POA: Diagnosis present

## 2013-07-18 DIAGNOSIS — T451X5A Adverse effect of antineoplastic and immunosuppressive drugs, initial encounter: Secondary | ICD-10-CM | POA: Diagnosis present

## 2013-07-18 DIAGNOSIS — G459 Transient cerebral ischemic attack, unspecified: Secondary | ICD-10-CM

## 2013-07-18 DIAGNOSIS — I639 Cerebral infarction, unspecified: Secondary | ICD-10-CM | POA: Diagnosis present

## 2013-07-18 DIAGNOSIS — C50219 Malignant neoplasm of upper-inner quadrant of unspecified female breast: Secondary | ICD-10-CM | POA: Diagnosis present

## 2013-07-18 DIAGNOSIS — R4789 Other speech disturbances: Secondary | ICD-10-CM | POA: Diagnosis present

## 2013-07-18 DIAGNOSIS — E876 Hypokalemia: Secondary | ICD-10-CM | POA: Diagnosis not present

## 2013-07-18 DIAGNOSIS — I6350 Cerebral infarction due to unspecified occlusion or stenosis of unspecified cerebral artery: Secondary | ICD-10-CM | POA: Insufficient documentation

## 2013-07-18 DIAGNOSIS — D649 Anemia, unspecified: Secondary | ICD-10-CM

## 2013-07-18 DIAGNOSIS — C50212 Malignant neoplasm of upper-inner quadrant of left female breast: Secondary | ICD-10-CM

## 2013-07-18 DIAGNOSIS — Z79899 Other long term (current) drug therapy: Secondary | ICD-10-CM

## 2013-07-18 DIAGNOSIS — I10 Essential (primary) hypertension: Secondary | ICD-10-CM | POA: Diagnosis present

## 2013-07-18 DIAGNOSIS — F411 Generalized anxiety disorder: Secondary | ICD-10-CM | POA: Diagnosis present

## 2013-07-18 DIAGNOSIS — K219 Gastro-esophageal reflux disease without esophagitis: Secondary | ICD-10-CM | POA: Diagnosis present

## 2013-07-18 DIAGNOSIS — D696 Thrombocytopenia, unspecified: Secondary | ICD-10-CM | POA: Diagnosis present

## 2013-07-18 DIAGNOSIS — T502X5A Adverse effect of carbonic-anhydrase inhibitors, benzothiadiazides and other diuretics, initial encounter: Secondary | ICD-10-CM | POA: Diagnosis present

## 2013-07-18 DIAGNOSIS — K602 Anal fissure, unspecified: Secondary | ICD-10-CM

## 2013-07-18 DIAGNOSIS — R471 Dysarthria and anarthria: Secondary | ICD-10-CM

## 2013-07-18 HISTORY — DX: Anemia, unspecified: D64.9

## 2013-07-18 HISTORY — DX: Depression, unspecified: F32.A

## 2013-07-18 HISTORY — DX: Major depressive disorder, single episode, unspecified: F32.9

## 2013-07-18 LAB — COMPREHENSIVE METABOLIC PANEL
ALT: 36 U/L — ABNORMAL HIGH (ref 0–35)
BUN: 13 mg/dL (ref 6–23)
CO2: 29 mEq/L (ref 19–32)
Calcium: 9.9 mg/dL (ref 8.4–10.5)
Creatinine, Ser: 0.64 mg/dL (ref 0.50–1.10)
GFR calc Af Amer: >90 mL/min (ref >90)
GFR calc non Af Amer: >90 mL/min (ref >90)
Glucose, Bld: 107 mg/dL — ABNORMAL HIGH (ref 70–99)

## 2013-07-18 LAB — POCT I-STAT TROPONIN I: Troponin i, poc: 0.05 ng/mL (ref 0.00–0.08)

## 2013-07-18 LAB — APTT: aPTT: 25 seconds (ref 24–37)

## 2013-07-18 LAB — URINALYSIS, ROUTINE W REFLEX MICROSCOPIC
Bilirubin Urine: NEGATIVE
Nitrite: NEGATIVE
Specific Gravity, Urine: 1.019 (ref 1.005–1.030)
Urobilinogen, UA: 0.2 mg/dL (ref 0.0–1.0)

## 2013-07-18 LAB — GLUCOSE, CAPILLARY: Glucose-Capillary: 113 mg/dL — ABNORMAL HIGH (ref 70–99)

## 2013-07-18 LAB — PROTIME-INR: INR: 0.96 (ref 0.00–1.49)

## 2013-07-18 LAB — DIFFERENTIAL
Basophils Absolute: 0 10*3/uL (ref 0.0–0.1)
Lymphocytes Relative: 44 % (ref 12–46)
Neutro Abs: 1.2 10*3/uL — ABNORMAL LOW (ref 1.7–7.7)

## 2013-07-18 LAB — URINE MICROSCOPIC-ADD ON

## 2013-07-18 LAB — CBC
MCH: 31.5 pg (ref 26.0–34.0)
MCHC: 33.2 g/dL (ref 30.0–36.0)
Platelets: 71 10*3/uL — ABNORMAL LOW (ref 150–400)
RBC: 3.02 MIL/uL — ABNORMAL LOW (ref 3.87–5.11)

## 2013-07-18 LAB — RAPID URINE DRUG SCREEN, HOSP PERFORMED
Amphetamines: POSITIVE — AB
Opiates: NOT DETECTED

## 2013-07-18 MED ORDER — DIPHENHYDRAMINE HCL 50 MG/ML IJ SOLN: 25.0000 mg | Freq: Once | INTRAMUSCULAR | Status: DC

## 2013-07-18 MED ORDER — LORAZEPAM 2 MG/ML IJ SOLN
1.0000 mg | INTRAMUSCULAR | Status: AC
  Administered 2013-07-18: 1 mg via INTRAVENOUS

## 2013-07-18 MED ORDER — LORAZEPAM 2 MG/ML IJ SOLN
1.0000 mg | INTRAMUSCULAR | Status: AC
  Administered 2013-07-18: 1 mg via INTRAVENOUS
  Filled 2013-07-18: qty 1

## 2013-07-18 NOTE — ED Notes (Signed)
Pt and husband report pt has breast cancer, currently receiving chemo. Missed chemo this week due to platelets being low. Today husband reports pt called him around 1615 saying he needed to come home because she was not feeling right. Husband said pt is not acting her normal, and was talking different. Pt can answer orientation questions appropriately but still having difficulty explaining herself. Having diffuclty coming up with words to describe how she feels. Denies pain. Denies numbness or tingling.

## 2013-07-18 NOTE — ED Notes (Signed)
Attempted hourly rounding and stroke swallow screen documented in error. CN made aware.

## 2013-07-18 NOTE — ED Provider Notes (Signed)
CSN: [id number]     Arrival date & time 07/18/13  1756 History   First MD Initiated Contact with Patient 07/18/13 1813     Chief Complaint  Patient presents with  . cancer pt, confusion    (Consider location/radiation/quality/duration/timing/severity/associated sxs/prior Treatment) HPI Comments: 57 year old female with a history of left-sided breast cancer presents with acute dysarthria. Per the patient seems to started about 3 PM (3-1/2 hours ago). Her husband says she woke up normal but called when she had trouble getting up her words and repeating phrases. The spouse states that her symptoms have mildly improved but she still having word finding difficulty. She was speaking much lower on the ride over here but it seems to be mildly improving. Denies any headaches or head trauma. Her platelets were too low to receive chemotherapy yesterday at oncology visit. He states that she had the "sniffles" yesterday but otherwise appeared well and denies any fevers or chills.  The history is provided by the patient and the spouse.    Past Medical History  Diagnosis Date  . Anxiety   . GERD (gastroesophageal reflux disease)   . Hypertension   . Breast cancer   . MRSA (methicillin resistant Staphylococcus aureus) 2009   Past Surgical History  Procedure Laterality Date  . Anal sphincterotomy  04/2011  . Hemorrhoid surgery  04/2011    ligation  . Breast lumpectomy with needle localization Left 02/04/2013    Procedure: LEFT BREAST LUMPECTOMY WITH NEEDLE LOCALIZATION;  Surgeon: Almond Lint, MD;  Location: [facility] OR;  Service: General;  Laterality: Left;  . Axillary lymph node dissection Left 02/04/2013    Procedure: LEFT AXILLARY LYMPH NODE DISSECTION;  Surgeon: Almond Lint, MD;  Location: [facility] OR;  Service: General;  Laterality: Left;  End: 1512  . Portacath placement Right 02/04/2013    Procedure: INSERTION PORT-A-CATH;  Surgeon: Almond Lint, MD;  Location: [facility] OR;  Service: General;  Laterality: Right;  Start  Time: 9147.  Marland Kitchen Appendectomy  1980   Family History  Problem Relation Age of Onset  . Lung cancer Father   . Hypertension Father   . Thyroid cancer Father     dx in his 18s  . Breast cancer Paternal Aunt 30  . Colon cancer Paternal Aunt     dx in her 50x  . Cervical cancer Paternal Aunt     dzx in her 52s  . Ovarian cancer Cousin     dx in her lage 3s  . Breast cancer Cousin     maternal first cousin, once removed; dx in her late 47s  . Breast cancer Cousin     maternal first cousin once removed; dx in late 70s  . Hypertension Mother   . Diabetes Mother   . Dementia Mother   . Hypertension Brother   . Seizures Brother     Alcohol induced.  . Cancer Paternal Uncle     oral cancer  . Kidney cancer Paternal Grandmother   . Cancer Cousin     several paternal cousins with brain cancer, leukemia, and other cancers   History  Substance Use Topics  . Smoking status: Never Smoker   . Smokeless tobacco: Never Used  . Alcohol Use: No   OB History   Grav Para Term Preterm Abortions TAB SAB Ect Mult Living                 Review of Systems  Constitutional: Negative for fever and chills.  HENT: Negative for neck pain.  Respiratory: Negative for shortness of breath.   Cardiovascular: Negative for chest pain.  Gastrointestinal: Negative for vomiting and abdominal pain.  Genitourinary: Negative for dysuria.  Neurological: Positive for speech difficulty. Negative for weakness, numbness and headaches.  Psychiatric/Behavioral: Negative for confusion.  All other systems reviewed and are negative.    Allergies  Review of patient's allergies indicates no known allergies.  Home Medications   Current Outpatient Rx  Name  Route  Sig  Dispense  Refill  . ALPRAZolam (XANAX) 1 MG tablet   Oral   Take 2 mg by mouth at bedtime as needed for sleep.          Marland Kitchen amphetamine-dextroamphetamine (ADDERALL) 10 MG tablet   Oral   Take 20 mg by mouth daily.          . B Complex-C  (SUPER B COMPLEX PO)   Oral   Take 1 tablet by mouth daily.         . ciprofloxacin (CIPRO) 500 MG tablet   Oral   Take 1 tablet (500 mg total) by mouth 2 (two) times daily.   14 tablet   6   . COMPRO 25 MG suppository               . dexamethasone (DECADRON) 4 MG tablet   Oral   Take 2 tablets (8 mg total) by mouth 2 (two) times daily with a meal. Take two times a day starting the day after chemotherapy for 3 days.   30 tablet   1   . escitalopram (LEXAPRO) 20 MG tablet   Oral   Take 40 mg by mouth daily.          Marland Kitchen esomeprazole (NEXIUM) 40 MG capsule   Oral   Take 40 mg by mouth daily as needed. For reflux         . gabapentin (NEURONTIN) 100 MG capsule   Oral   Take 1 capsule (100 mg total) by mouth 3 (three) times daily.   90 capsule   2   . HYDROcodone-acetaminophen (NORCO/VICODIN) 5-325 MG per tablet               . lidocaine-prilocaine (EMLA) cream   Topical   Apply topically as needed.   30 g   7   . LORazepam (ATIVAN) 0.5 MG tablet   Oral   Take 1 tablet (0.5 mg total) by mouth every 6 (six) hours as needed (Nausea or vomiting).   30 tablet   0   . Multiple Vitamin (MULTIVITAMIN) tablet   Oral   Take 1 tablet by mouth daily.         . Omega-3 Fatty Acids (FISH OIL PO)   Oral   Take 1 capsule by mouth daily.         . ondansetron (ZOFRAN) 8 MG tablet   Oral   Take 1 tablet (8 mg total) by mouth 2 (two) times daily. Take two times a day starting the day after chemo for 3 days. Then take two times a day as needed for nausea or vomiting.   30 tablet   1   . OVER THE COUNTER MEDICATION   Oral   Take by mouth as needed.         Marland Kitchen OVER THE COUNTER MEDICATION   Oral   Take by mouth as needed. OTC stool softner, as needed         . potassium chloride SA (K-DUR,KLOR-CON) 20 MEQ tablet  Oral   Take 1 tablet (20 mEq total) by mouth 3 (three) times daily. For 5 days then as directed.   60 tablet   0     Take for five days.    . predniSONE (STERAPRED UNI-PAK) 10 MG tablet      Take 60 mg (6 tablets) day one, 50 mg (5 tablets) day two, 40 mg (4 tablets) day three, 30 mg (3 tablets) day four, 20 mg (2 tablets) day five, 10 mg (1 tablet) day 6.   21 tablet   0   . prochlorperazine (COMPAZINE) 10 MG tablet   Oral   Take 1 tablet (10 mg total) by mouth every 6 (six) hours as needed (Nausea or vomiting).   30 tablet   1   . prochlorperazine (COMPAZINE) 25 MG suppository   Rectal   Place 1 suppository (25 mg total) rectally every 12 (twelve) hours as needed for nausea.   12 suppository   3   . triamcinolone ointment (KENALOG) 0.5 %   Topical   Apply topically 2 (two) times daily.   30 g   0   . valsartan-hydrochlorothiazide (DIOVAN-HCT) 320-25 MG per tablet   Oral   Take 1 tablet by mouth daily.           BP 156/78  Pulse 87  Temp(Src) 98.5 F (36.9 C) (Oral)  Resp 16  SpO2 100% Physical Exam  Nursing note and vitals reviewed. Constitutional: She is oriented to person, place, and time. She appears well-developed and well-nourished.  HENT:  Head: Normocephalic and atraumatic.  Right Ear: External ear normal.  Left Ear: External ear normal.  Nose: Nose normal.  Eyes: Right eye exhibits no discharge. Left eye exhibits no discharge.  Cardiovascular: Normal rate, regular rhythm and normal heart sounds.   Pulmonary/Chest: Effort normal and breath sounds normal.  Abdominal: Soft. There is no tenderness.  Neurological: She is alert and oriented to person, place, and time. She has normal strength. No cranial nerve deficit or sensory deficit. GCS eye subscore is 4. GCS verbal subscore is 5. GCS motor subscore is 6.  Trouble speaking in fluent sentences.  Skin: Skin is warm and dry.    ED Course  Procedures (including critical care time) Labs Review Labs Reviewed  COMPREHENSIVE METABOLIC PANEL - Abnormal; Notable for the following:    Glucose, Bld 107 (*)    ALT 36 (*)    All other components  within normal limits  URINALYSIS, ROUTINE W REFLEX MICROSCOPIC - Abnormal; Notable for the following:    Leukocytes, UA SMALL (*)    All other components within normal limits  CBC - Abnormal; Notable for the following:    WBC 2.5 (*)    RBC 3.02 (*)    Hemoglobin 9.5 (*)    HCT 28.6 (*)    RDW 19.3 (*)    Platelets 71 (*)    All other components within normal limits  URINE RAPID DRUG SCREEN (HOSP PERFORMED) - Abnormal; Notable for the following:    Benzodiazepines POSITIVE (*)    Amphetamines POSITIVE (*)    All other components within normal limits  DIFFERENTIAL - Abnormal; Notable for the following:    Neutro Abs 1.2 (*)    All other components within normal limits  GLUCOSE, CAPILLARY - Abnormal; Notable for the following:    Glucose-Capillary 113 (*)    All other components within normal limits  PROTIME-INR  APTT  URINE MICROSCOPIC-ADD ON  POCT I-STAT TROPONIN I  Imaging Review Ct Head Wo Contrast  07/18/2013   CLINICAL DATA:  57 year old female with confusion and slurred speech.  EXAM: CT HEAD WITHOUT CONTRAST  TECHNIQUE: Contiguous axial images were obtained from the base of the skull through the vertex without intravenous contrast.  COMPARISON:  None.  FINDINGS: Visualized paranasal sinuses and mastoids are clear. No acute osseous abnormality identified. Visualized orbits and scalp soft tissues are within normal limits.  Cerebral volume is normal. No midline shift, ventriculomegaly, mass effect, evidence of mass lesion, intracranial hemorrhage or evidence of cortically based acute infarction. Gray-white matter differentiation is within normal limits throughout the brain. No suspicious intracranial vascular hyperdensity.  IMPRESSION: Normal noncontrast CT appearance of the brain.   Electronically Signed   By: Augusto Gamble M.D.   On: 07/18/2013 18:40    MDM   1. Dysarthria    Due to the acuity of her symptoms a code stroke was called as her symptoms started less than 4 hours  prior to arrival. The symptoms are mildly improving and her CT of her head is negative. Her NIH stroke scale is low. Due to her low platelets she is unlikely to be an interventional candidate. I discussed with neurology who recommends transfer to [facility] for neurologic evaluation. She is medically stable and feels she is safe for transport and further workup as an inpatient.   Audree Camel, MD 07/18/13 2216

## 2013-07-18 NOTE — ED Notes (Signed)
Bed: ZO10 Expected date:  Expected time:  Means of arrival:  Comments: Winzer, res a

## 2013-07-18 NOTE — H&P (Deleted)
[facility] History and Physical  Kristi [name] MWU:[id number] DOB: 1956/06/06 DOA: 07/18/2013  Referring physician: Dr. Criss Alvine PCP: Altamese Chilili, MD  Specialists: Neurology, Dr. Cyril Mourning   Chief Complaint: Slurred speech/inability to find words  HPI: Kristi [name] is a 57 y.o. female has a past medical history significant for breast cancer diagnosed early 2014 (invasive ductal carcinoma) status post left breast lumpectomy and axillary lymph node dissection positive for metastatic disease and undergoing chemotherapy currently presents to the emergency room with a chief complaint of difficulty finding words of sudden onset today at 3 PM. Her daughter is in the room in the emergency room, and states that today she talk to her mom several times, and she seemed normal initially in the morning. Around 3 PM she took her mom over the phone, and patient endorses "did not feel right". The daughter states that her mom had slurred speech and difficulty finding words and this is very unlikely over. She was rolled over to the emergency room, EDP consulted neurology for concern for stroke and after discussion with on call Neurologist patient is to be transferred to [facility] for further evaluation by the Stroke team. Patient denies any focal weakness, denies any numbness or tingling, she has no chest pain, shortness or breath, headaches, lightheadedness or dizziness. The daughter also reports that yesterday, when she was in the cancer center, she had a heart rate of 130. Hospitalist service [facility].  Review of Systems: As per history of present illness, otherwise negative  Past Medical History  Diagnosis Date  . Anxiety   . GERD (gastroesophageal reflux disease)   . Hypertension   . Breast cancer   . MRSA (methicillin resistant Staphylococcus aureus) 2009   Past Surgical History  Procedure Laterality Date  . Anal sphincterotomy  04/2011  . Hemorrhoid surgery  04/2011   ligation  . Breast lumpectomy with needle localization Left 02/04/2013    Procedure: LEFT BREAST LUMPECTOMY WITH NEEDLE LOCALIZATION;  Surgeon: Almond Lint, MD;  [facility]: [facility];  Service: General;  Laterality: Left;  . Axillary lymph node dissection Left 02/04/2013    Procedure: LEFT AXILLARY LYMPH NODE DISSECTION;  Surgeon: Almond Lint, MD;  Location: [facility] OR;  Service: General;  Laterality: Left;  End: 1512  . Portacath placement Right 02/04/2013    Procedure: INSERTION PORT-A-CATH;  Surgeon: Almond Lint, MD;  Location: [facility] OR;  Service: General;  Laterality: Right;  Start Time: 7253.  Marland Kitchen Appendectomy  1980   Social History:  reports that she has never smoked. She has never used smokeless tobacco. She reports that she does not drink alcohol or use illicit drugs.  No Known Allergies  Family History  Problem Relation Age of Onset  . Lung cancer Father   . Hypertension Father   . Thyroid cancer Father     dx in his 70s  . Breast cancer Paternal Aunt 30  . Colon cancer Paternal Aunt     dx in her 50x  . Cervical cancer Paternal Aunt     dzx in her 77s  . Ovarian cancer Cousin     dx in her lage 80s  . Breast cancer Cousin     maternal first cousin, once removed; dx in her late 61s  . Breast cancer Cousin     maternal first cousin once removed; dx in late 36s  . Hypertension Mother   . Diabetes Mother   . Dementia Mother   . Hypertension Brother   .  Seizures Brother     Alcohol induced.  . Cancer Paternal Uncle     oral cancer  . Kidney cancer Paternal Grandmother   . Cancer Cousin     several paternal cousins with brain cancer, leukemia, and other cancers   Prior to Admission medications   Medication Sig Start Date End Date Taking? Authorizing Provider  ALPRAZolam Prudy Feeler) 1 MG tablet Take 2 mg by mouth at bedtime as needed for sleep.    Yes Historical Provider, MD  amphetamine-dextroamphetamine (ADDERALL) 10 MG tablet Take 20 mg by mouth daily.    Yes Historical Provider, MD  B  Complex-C (SUPER B COMPLEX PO) Take 1 tablet by mouth daily.   Yes Historical Provider, MD  dexamethasone (DECADRON) 4 MG tablet Take 2 tablets (8 mg total) by mouth 2 (two) times daily with a meal. Take two times a day starting the day after chemotherapy for 3 days. 05/07/13  Yes Augustin Schooling, NP  docusate sodium (COLACE) 100 MG capsule Take 100 mg by mouth 2 (two) times daily as needed for constipation.   Yes Historical Provider, MD  escitalopram (LEXAPRO) 20 MG tablet Take 40 mg by mouth daily.    Yes Historical Provider, MD  esomeprazole (NEXIUM) 40 MG capsule Take 40 mg by mouth daily as needed. For reflux   Yes Historical Provider, MD  gabapentin (NEURONTIN) 100 MG capsule Take 1 capsule (100 mg total) by mouth 3 (three) times daily. 06/26/13  Yes Augustin Schooling, NP  lidocaine-prilocaine (EMLA) cream Apply topically as needed. 02/28/13  Yes Victorino December, MD  LORazepam (ATIVAN) 0.5 MG tablet Take 1 tablet (0.5 mg total) by mouth every 6 (six) hours as needed (Nausea or vomiting). 05/07/13  Yes Augustin Schooling, NP  Multiple Vitamin (MULTIVITAMIN) tablet Take 1 tablet by mouth daily.   Yes Historical Provider, MD  Omega-3 Fatty Acids (FISH OIL PO) Take 1 capsule by mouth daily.   Yes Historical Provider, MD  ondansetron (ZOFRAN) 8 MG tablet Take 1 tablet (8 mg total) by mouth 2 (two) times daily. Take two times a day starting the day after chemo for 3 days. Then take two times a day as needed for nausea or vomiting. 05/07/13  Yes Augustin Schooling, NP  OVER THE COUNTER MEDICATION Take by mouth as needed.   Yes Historical Provider, MD  potassium chloride SA (K-DUR,KLOR-CON) 20 MEQ tablet Take 1 tablet (20 mEq total) by mouth 3 (three) times daily. For 5 days then as directed. 06/26/13  Yes Amy Allegra Grana, PA-C  prochlorperazine (COMPAZINE) 10 MG tablet Take 1 tablet (10 mg total) by mouth every 6 (six) hours as needed (Nausea or vomiting). 05/07/13  Yes Augustin Schooling, NP  prochlorperazine  (COMPAZINE) 25 MG suppository Place 1 suppository (25 mg total) rectally every 12 (twelve) hours as needed for nausea. 05/07/13  Yes Augustin Schooling, NP  triamcinolone ointment (KENALOG) 0.5 % Apply topically 2 (two) times daily. 07/04/13  Yes Augustin Schooling, NP  valsartan-hydrochlorothiazide (DIOVAN-HCT) 320-25 MG per tablet Take 0.5 tablets by mouth daily.  01/07/13  Yes Historical Provider, MD   Physical Exam: Filed Vitals:   07/18/13 1814 07/18/13 1835 07/18/13 1900  BP: 156/78  136/77  Pulse: 87 79 78  Temp: 98.5 F (36.9 C)    TempSrc: Oral    Resp: 16 19 10   SpO2: 100% 99% 100%     General:  No apparent distress, pleasant African American female   Eyes: PERRL, no scleral icterus  ENT: moist oropharynx  Neck: supple, no JVD  Cardiovascular: regular rate without MRG; 2+ peripheral pulses  Respiratory: CTA biL, good air movement without wheezing, rhonchi or crackled  Abdomen: soft, non tender to palpation, positive bowel sounds, no guarding, no rebound  Skin: no rashes  Musculoskeletal: no peripheral edema  Psychiatric: normal mood and affect  Neurologic: Expressive aphasia evident, no slurred speech, cranial nerves 2-12 grossly intact, strength 5 out of 5 in all 4 extremities, sensation intact.  Labs on Admission:  Basic Metabolic Panel:  Recent Labs Lab 07/17/13 1310 07/18/13 1815  NA 139 138  K 3.8 3.5  CL  --  102  CO2 30* 29  GLUCOSE 115 107*  BUN 18.8 13  CREATININE 0.8 0.64  CALCIUM 9.5 9.9   Liver Function Tests:  Recent Labs Lab 07/17/13 1310 07/18/13 1815  AST 22 26  ALT 42 36*  ALKPHOS 42 41  BILITOT 0.38 0.3  PROT 6.2* 6.3  ALBUMIN 3.6 3.7   CBC:  Recent Labs Lab 07/17/13 1310 07/18/13 1815  WBC 2.7* 2.5*  NEUTROABS 1.3* 1.2*  HGB 10.0* 9.5*  HCT 30.3* 28.6*  MCV 97.6 94.7  PLT 71* 71*   CBG:  Recent Labs Lab 07/18/13 1826  GLUCAP 113*    Radiological Exams on Admission: Ct Head Wo Contrast  07/18/2013    CLINICAL DATA:  57 year old female with confusion and slurred speech.  EXAM: CT HEAD WITHOUT CONTRAST  TECHNIQUE: Contiguous axial images were obtained from the base of the skull through the vertex without intravenous contrast.  COMPARISON:  None.  FINDINGS: Visualized paranasal sinuses and mastoids are clear. No acute osseous abnormality identified. Visualized orbits and scalp soft tissues are within normal limits.  Cerebral volume is normal. No midline shift, ventriculomegaly, mass effect, evidence of mass lesion, intracranial hemorrhage or evidence of cortically based acute infarction. Gray-white matter differentiation is within normal limits throughout the brain. No suspicious intracranial vascular hyperdensity.  IMPRESSION: Normal noncontrast CT appearance of the brain.   Electronically Signed   By: Augusto Gamble M.D.   On: 07/18/2013 18:40   EKG: Independently reviewed.  Assessment/Plan Principal Problem:   CVA (cerebral infarction) Active Problems:   Cancer of upper-inner quadrant of female breast  CVA - since code stroke was called patient needs to be transferred ED to ED before admission  Time spent: 42   Akera Snowberger M. Elvera Lennox, MD [facility] Pager [phone]  If 7PM-7AM, please contact night-coverage [email or web address] Password [facility] 07/18/2013, 7:27 PM

## 2013-07-18 NOTE — ED Notes (Signed)
Patient transported to MRI 

## 2013-07-18 NOTE — Telephone Encounter (Signed)
Daughter called to report her mother just called to report that she "did not feel right". "Something is off". Chemo was held yesterday due to counts. She is not aware of any fever. No dyspnea. He speech seemed "a little slow, but not slurred" when she spoke with her. Instructed her to take her mother to emergency room to be evaluated. Could be something unrelated to cancer such as a cardiac or neuro event/TIA. She will get her there now.

## 2013-07-18 NOTE — ED Notes (Signed)
2nd dose of ativan given per Dr. Thad Ranger, no changes, pt to MRI with MRI tech, VSS, family remains in room.

## 2013-07-18 NOTE — ED Notes (Signed)
Pt alert, NAD, calm, interactive, skin W&D, resps e/u, family at [facility], VSS. Ativan given pre-MRI, pending MRI, family/ pt denies questions.

## 2013-07-18 NOTE — Consult Note (Signed)
Referring Physician: Criss Alvine    Chief Complaint: Difficulty with speech  HPI: Kristi [name] is an 57 y.o. female who awakened today normal.  Was able to do some housework, etc.  Around noon began to feel poorly.  When her daughter talked to her on the phone at 3pm her speech was slurred.  She called her husband at about 4:15 and asked him to come home because she was feeling poorly.  When he arrived she was having difficulty getting her words out.  Patient was brought to the ED at that time.  Code stroke was called at [place].  Initial NIHSS of 1.  Patient seemed to improve while in the ED and at transfer (1942) the patient was again at baseline.  On arrival to [facility] ED again was having difficulty expressing herself.  Of note patient seems to be amnestic of the much of what has happened today.    Date last known well: Date: 07/18/2013 Time last known well: Time: 12:00 tPA Given: No: Improvement in symptoms, low NIHSS score  Past Medical History  Diagnosis Date  . Anxiety   . GERD (gastroesophageal reflux disease)   . Hypertension   . Breast cancer   . MRSA (methicillin resistant Staphylococcus aureus) 2009    Past Surgical History  Procedure Laterality Date  . Anal sphincterotomy  04/2011  . Hemorrhoid surgery  04/2011    ligation  . Breast lumpectomy with needle localization Left 02/04/2013    Procedure: LEFT BREAST LUMPECTOMY WITH NEEDLE LOCALIZATION;  Surgeon: Almond Lint, MD;  [facility]: [facility];  Service: General;  Laterality: Left;  . Axillary lymph node dissection Left 02/04/2013    Procedure: LEFT AXILLARY LYMPH NODE DISSECTION;  Surgeon: Almond Lint, MD;  Location: [facility] OR;  Service: General;  Laterality: Left;  End: 1512  . Portacath placement Right 02/04/2013    Procedure: INSERTION PORT-A-CATH;  Surgeon: Almond Lint, MD;  Location: [facility] OR;  Service: General;  Laterality: Right;  Start Time: 1610.  Marland Kitchen Appendectomy  1980    Family History  Problem Relation Age of Onset  . Lung cancer  Father   . Hypertension Father   . Thyroid cancer Father     dx in his 16s  . Breast cancer Paternal Aunt 30  . Colon cancer Paternal Aunt     dx in her 50x  . Cervical cancer Paternal Aunt     dzx in her 46s  . Ovarian cancer Cousin     dx in her lage 49s  . Breast cancer Cousin     maternal first cousin, once removed; dx in her late 51s  . Breast cancer Cousin     maternal first cousin once removed; dx in late 16s  . Hypertension Mother   . Diabetes Mother   . Dementia Mother   . Hypertension Brother   . Seizures Brother     Alcohol induced.  . Cancer Paternal Uncle     oral cancer  . Kidney cancer Paternal Grandmother   . Cancer Cousin     several paternal cousins with brain cancer, leukemia, and other cancers   Social History:  reports that she has never smoked. She has never used smokeless tobacco. She reports that she does not drink alcohol or use illicit drugs.  Allergies: No Known Allergies  Medications: I have reviewed the patient's current medications. Prior to Admission:  Current outpatient prescriptions:ALPRAZolam (XANAX) 1 MG tablet, Take 2 mg by mouth at bedtime as needed for sleep. ,  Disp: , Rfl: ;  amphetamine-dextroamphetamine (ADDERALL) 10 MG tablet, Take 20 mg by mouth daily. , Disp: , Rfl: ;  B Complex-C (SUPER B COMPLEX PO), Take 1 tablet by mouth daily., Disp: , Rfl:  dexamethasone (DECADRON) 4 MG tablet, Take 2 tablets (8 mg total) by mouth 2 (two) times daily with a meal. Take two times a day starting the day after chemotherapy for 3 days., Disp: 30 tablet, Rfl: 1;  docusate sodium (COLACE) 100 MG capsule, Take 100 mg by mouth 2 (two) times daily as needed for constipation., Disp: , Rfl: ;  escitalopram (LEXAPRO) 20 MG tablet, Take 40 mg by mouth daily. , Disp: , Rfl:  esomeprazole (NEXIUM) 40 MG capsule, Take 40 mg by mouth daily as needed. For reflux, Disp: , Rfl: ;  gabapentin (NEURONTIN) 100 MG capsule, Take 1 capsule (100 mg total) by mouth 3 (three)  times daily., Disp: 90 capsule, Rfl: 2;  lidocaine-prilocaine (EMLA) cream, Apply topically as needed., Disp: 30 g, Rfl: 7 LORazepam (ATIVAN) 0.5 MG tablet, Take 1 tablet (0.5 mg total) by mouth every 6 (six) hours as needed (Nausea or vomiting)., Disp: 30 tablet, Rfl: 0;  Multiple Vitamin (MULTIVITAMIN) tablet, Take 1 tablet by mouth daily., Disp: , Rfl: ;  Omega-3 Fatty Acids (FISH OIL PO), Take 1 capsule by mouth daily., Disp: , Rfl:  ondansetron (ZOFRAN) 8 MG tablet, Take 1 tablet (8 mg total) by mouth 2 (two) times daily. Take two times a day starting the day after chemo for 3 days. Then take two times a day as needed for nausea or vomiting., Disp: 30 tablet, Rfl: 1;  OVER THE COUNTER MEDICATION, Take by mouth as needed., Disp: , Rfl:  potassium chloride SA (K-DUR,KLOR-CON) 20 MEQ tablet, Take 1 tablet (20 mEq total) by mouth 3 (three) times daily. For 5 days then as directed., Disp: 60 tablet, Rfl: 0;  prochlorperazine (COMPAZINE) 10 MG tablet, Take 1 tablet (10 mg total) by mouth every 6 (six) hours as needed (Nausea or vomiting)., Disp: 30 tablet, Rfl: 1 prochlorperazine (COMPAZINE) 25 MG suppository, Place 1 suppository (25 mg total) rectally every 12 (twelve) hours as needed for nausea., Disp: 12 suppository, Rfl: 3;  triamcinolone ointment (KENALOG) 0.5 %, Apply topically 2 (two) times daily., Disp: 30 g, Rfl: 0;  valsartan-hydrochlorothiazide (DIOVAN-HCT) 320-25 MG per tablet, Take 0.5 tablets by mouth daily. , Disp: , Rfl:   ROS: History obtained from the patient  General ROS: negative for - chills, fatigue, fever, night sweats, weight gain or weight loss Psychological ROS: negative for - behavioral disorder, hallucinations, memory difficulties, mood swings or suicidal ideation Ophthalmic ROS: negative for - blurry vision, double vision, eye pain or loss of vision ENT ROS: negative for - epistaxis, nasal discharge, oral lesions, sore throat, tinnitus or vertigo Allergy and Immunology ROS:  negative for - hives or itchy/watery eyes Hematological and Lymphatic ROS: negative for - bleeding problems, bruising or swollen lymph nodes Endocrine ROS: negative for - galactorrhea, hair pattern changes, polydipsia/polyuria or temperature intolerance Respiratory ROS: negative for - cough, hemoptysis, shortness of breath or wheezing Cardiovascular ROS: negative for - chest pain, dyspnea on exertion, edema or irregular heartbeat Gastrointestinal ROS: negative for - abdominal pain, diarrhea, hematemesis, nausea/vomiting or stool incontinence Genito-Urinary ROS: negative for - dysuria, hematuria, incontinence or urinary frequency/urgency Musculoskeletal ROS: negative for - joint swelling or muscular weakness Neurological ROS: as noted in HPI Dermatological ROS: negative for rash and skin lesion changes  Physical Examination: Blood pressure 136/94, pulse  88, temperature 98.4 F (36.9 C), temperature source Oral, resp. rate 24, SpO2 100.00%.  Neurologic Examination: Mental Status: Alert, oriented, thought content appropriate.  Speech fluent without evidence of aphasia.  Able to follow 3 step commands without difficulty. Cranial Nerves: II: Discs flat bilaterally; Visual fields grossly normal, pupils equal, round, reactive to light and accommodation III,IV, VI: ptosis not present, extra-ocular motions intact bilaterally V,VII: smile symmetric, facial light touch sensation normal bilaterally VIII: hearing normal bilaterally IX,X: gag reflex present XI: bilateral shoulder shrug XII: midline tongue extension Motor: Right : Upper extremity   5/5    Left:     Upper extremity   5/5  Lower extremity   5/5     Lower extremity   5/5 Tone and bulk:normal tone throughout; no atrophy noted Sensory: Pinprick and light touch intact throughout, bilaterally Deep Tendon Reflexes: 2+ in the upper extremities and absent in the lower extremities.   Plantars: Right: mute   Left: mute Cerebellar: normal  finger-to-nose and normal heel-to-shin test Gait: Unable to test CV: pulses palpable throughout   Laboratory Studies:  Basic Metabolic Panel:  Recent Labs Lab 07/17/13 1310 07/18/13 1815  NA 139 138  K 3.8 3.5  CL  --  102  CO2 30* 29  GLUCOSE 115 107*  BUN 18.8 13  CREATININE 0.8 0.64  CALCIUM 9.5 9.9    Liver Function Tests:  Recent Labs Lab 07/17/13 1310 07/18/13 1815  AST 22 26  ALT 42 36*  ALKPHOS 42 41  BILITOT 0.38 0.3  PROT 6.2* 6.3  ALBUMIN 3.6 3.7   No results found for this basename: LIPASE, AMYLASE,  in the last 168 hours No results found for this basename: AMMONIA,  in the last 168 hours  CBC:  Recent Labs Lab 07/17/13 1310 07/18/13 1815  WBC 2.7* 2.5*  NEUTROABS 1.3* 1.2*  HGB 10.0* 9.5*  HCT 30.3* 28.6*  MCV 97.6 94.7  PLT 71* 71*    Cardiac Enzymes: No results found for this basename: CKTOTAL, CKMB, CKMBINDEX, TROPONINI,  in the last 168 hours  BNP: No components found with this basename: POCBNP,   CBG:  Recent Labs Lab 07/18/13 1826  GLUCAP 113*    Microbiology: Results for orders placed during the hospital encounter of 01/31/13  SURGICAL PCR SCREEN     Status: Abnormal   Collection Time    01/31/13  3:48 PM      Result Value Range Status   MRSA, PCR NEGATIVE  NEGATIVE Final   Staphylococcus aureus POSITIVE (*) NEGATIVE Final   Comment:            The Xpert SA Assay (FDA     approved for NASAL specimens     in patients over 67 years of age),     is one component of     a comprehensive surveillance     program.  Test performance has     been validated by [employer] for patients greater     than or equal to 31 year old.     It is not intended     to diagnose infection nor to     guide or monitor treatment.    Coagulation Studies:  Recent Labs  07/18/13 1815  LABPROT 12.6  INR 0.96    Urinalysis:  Recent Labs Lab 07/18/13 1929  COLORURINE YELLOW  LABSPEC 1.019  PHURINE 5.0  GLUCOSEU NEGATIVE   HGBUR NEGATIVE  BILIRUBINUR NEGATIVE  KETONESUR NEGATIVE  PROTEINUR NEGATIVE  UROBILINOGEN 0.2  NITRITE NEGATIVE  LEUKOCYTESUR SMALL*    Lipid Panel: No results found for this basename: chol, trig, hdl, cholhdl, vldl, ldlcalc    HgbA1C:  No results found for this basename: HGBA1C    Urine Drug Screen:     Component Value Date/Time   LABOPIA NONE DETECTED 07/18/2013 1930   COCAINSCRNUR NONE DETECTED 07/18/2013 1930   LABBENZ POSITIVE* 07/18/2013 1930   AMPHETMU POSITIVE* 07/18/2013 1930   THCU NONE DETECTED 07/18/2013 1930   LABBARB NONE DETECTED 07/18/2013 1930    Alcohol Level: No results found for this basename: ETH,  in the last 168 hours  Other results: EKG: sinus rhythm at 86 bpm.  Imaging: Ct Head Wo Contrast  07/18/2013   CLINICAL DATA:  57 year old female with confusion and slurred speech.  EXAM: CT HEAD WITHOUT CONTRAST  TECHNIQUE: Contiguous axial images were obtained from the base of the skull through the vertex without intravenous contrast.  COMPARISON:  None.  FINDINGS: Visualized paranasal sinuses and mastoids are clear. No acute osseous abnormality identified. Visualized orbits and scalp soft tissues are within normal limits.  Cerebral volume is normal. No midline shift, ventriculomegaly, mass effect, evidence of mass lesion, intracranial hemorrhage or evidence of cortically based acute infarction. Gray-white matter differentiation is within normal limits throughout the brain. No suspicious intracranial vascular hyperdensity.  IMPRESSION: Normal noncontrast CT appearance of the brain.   Electronically Signed   By: Augusto Gamble M.D.   On: 07/18/2013 18:40    Assessment: 57 y.o. female currently being treated for breast CA who presented today with altered speech.  Her symptoms have been intermittent today.  Unclear if there may be an underlying static abnormality.  Patient currently with an NIHSS of 0.  CT reviewed and shows no acute changes.    Stroke Risk Factors -  hypertension  Plan: 1. HgbA1c, fasting lipid panel 2. MRI, MRA  of the brain without contrast 3. PT consult, OT consult, Speech consult 4. Echocardiogram 5. Carotid dopplers 6. Prophylactic therapy-Antiplatelet med: Aspirin - dose 81mg  daily 7. Telemetry monitoring 8. Frequent neuro checks   Thana Farr, MD [facility] [phone] 07/18/2013, 9:15 PM

## 2013-07-19 ENCOUNTER — Encounter (HOSPITAL_COMMUNITY): Payer: Self-pay

## 2013-07-19 LAB — COMPREHENSIVE METABOLIC PANEL
ALT: 28 U/L (ref 0–35)
Albumin: 3.1 g/dL — ABNORMAL LOW (ref 3.5–5.2)
Alkaline Phosphatase: 32 U/L — ABNORMAL LOW (ref 39–117)
BUN: 11 mg/dL (ref 6–23)
Chloride: 103 mEq/L (ref 96–112)
Glucose, Bld: 96 mg/dL (ref 70–99)
Potassium: 3.4 mEq/L — ABNORMAL LOW (ref 3.5–5.1)
Sodium: 139 mEq/L (ref 135–145)
Total Bilirubin: 0.2 mg/dL — ABNORMAL LOW (ref 0.3–1.2)
Total Protein: 5.1 g/dL — ABNORMAL LOW (ref 6.0–8.3)

## 2013-07-19 LAB — GLUCOSE, CAPILLARY
Glucose-Capillary: 103 mg/dL — ABNORMAL HIGH (ref 70–99)
Glucose-Capillary: 115 mg/dL — ABNORMAL HIGH (ref 70–99)

## 2013-07-19 LAB — LIPID PANEL
HDL: 48 mg/dL (ref >39)
LDL Cholesterol: 77 mg/dL (ref 0–99)
Triglycerides: 87 mg/dL (ref <150)
VLDL: 17 mg/dL (ref 0–40)

## 2013-07-19 LAB — TROPONIN I: Troponin I: <0.3 ng/mL (ref <0.30)

## 2013-07-19 LAB — MRSA PCR SCREENING: MRSA by PCR: NEGATIVE

## 2013-07-19 LAB — RAPID URINE DRUG SCREEN, HOSP PERFORMED: Barbiturates: NOT DETECTED

## 2013-07-19 MED ORDER — ASPIRIN EC 81 MG PO TBEC
81.0000 mg | DELAYED_RELEASE_TABLET | Freq: Every day | ORAL | Status: DC
  Administered 2013-07-19 – 2013-07-20 (×2): 81 mg via ORAL
  Filled 2013-07-19 (×2): qty 1

## 2013-07-19 MED ORDER — ATORVASTATIN CALCIUM 40 MG PO TABS
40.0000 mg | ORAL_TABLET | Freq: Every day | ORAL | Status: DC
  Administered 2013-07-19: 40 mg via ORAL
  Filled 2013-07-19 (×2): qty 1

## 2013-07-19 MED ORDER — WHITE PETROLATUM GEL
Status: AC
  Administered 2013-07-19: 0.2
  Filled 2013-07-19: qty 5

## 2013-07-19 MED ORDER — ASPIRIN 81 MG PO TBEC: 81.0000 mg | DELAYED_RELEASE_TABLET | Freq: Every day | ORAL | Status: DC

## 2013-07-19 MED ORDER — VALSARTAN-HYDROCHLOROTHIAZIDE 320-25 MG PO TABS: 0.5000 | ORAL_TABLET | Freq: Every day | ORAL | Status: DC

## 2013-07-19 MED ORDER — IRBESARTAN 300 MG PO TABS
300.0000 mg | ORAL_TABLET | Freq: Every day | ORAL | Status: DC
  Administered 2013-07-20: 300 mg via ORAL
  Filled 2013-07-19 (×2): qty 1

## 2013-07-19 MED ORDER — ATORVASTATIN CALCIUM 40 MG PO TABS: 40.0000 mg | ORAL_TABLET | Freq: Every day | ORAL | Status: DC

## 2013-07-19 MED ORDER — POTASSIUM CHLORIDE CRYS ER 20 MEQ PO TBCR
40.0000 meq | EXTENDED_RELEASE_TABLET | Freq: Once | ORAL | Status: AC
  Administered 2013-07-19: 40 meq via ORAL
  Filled 2013-07-19: qty 2

## 2013-07-19 MED ORDER — GABAPENTIN 100 MG PO CAPS
100.0000 mg | ORAL_CAPSULE | Freq: Three times a day (TID) | ORAL | Status: DC
  Administered 2013-07-19 – 2013-07-20 (×5): 100 mg via ORAL
  Filled 2013-07-19 (×6): qty 1

## 2013-07-19 MED ORDER — PROCHLORPERAZINE 25 MG RE SUPP
25.0000 mg | Freq: Two times a day (BID) | RECTAL | Status: DC | PRN
  Filled 2013-07-19: qty 1

## 2013-07-19 MED ORDER — LORAZEPAM 0.5 MG PO TABS: 0.5000 mg | ORAL_TABLET | Freq: Four times a day (QID) | ORAL | Status: DC | PRN

## 2013-07-19 MED ORDER — ONDANSETRON HCL 8 MG PO TABS
8.0000 mg | ORAL_TABLET | Freq: Two times a day (BID) | ORAL | Status: DC
  Filled 2013-07-19 (×5): qty 1
  Filled 2013-07-19: qty 2

## 2013-07-19 MED ORDER — PANTOPRAZOLE SODIUM 40 MG PO TBEC
40.0000 mg | DELAYED_RELEASE_TABLET | Freq: Every day | ORAL | Status: DC
  Administered 2013-07-19 – 2013-07-20 (×2): 40 mg via ORAL
  Filled 2013-07-19 (×2): qty 1

## 2013-07-19 MED ORDER — ESCITALOPRAM OXALATE 20 MG PO TABS
40.0000 mg | ORAL_TABLET | Freq: Every day | ORAL | Status: DC
  Administered 2013-07-19 – 2013-07-20 (×2): 40 mg via ORAL
  Filled 2013-07-19 (×2): qty 2

## 2013-07-19 MED ORDER — ENOXAPARIN SODIUM 40 MG/0.4ML ~~LOC~~ SOLN: 40.0000 mg | SUBCUTANEOUS | Status: DC

## 2013-07-19 MED ORDER — HYDROCHLOROTHIAZIDE 25 MG PO TABS
25.0000 mg | ORAL_TABLET | Freq: Every day | ORAL | Status: DC
  Administered 2013-07-20: 25 mg via ORAL
  Filled 2013-07-19 (×2): qty 1

## 2013-07-19 MED ORDER — AMPHETAMINE-DEXTROAMPHETAMINE 10 MG PO TABS
20.0000 mg | ORAL_TABLET | Freq: Every day | ORAL | Status: DC
  Administered 2013-07-19 – 2013-07-20 (×2): 20 mg via ORAL
  Filled 2013-07-19 (×2): qty 2

## 2013-07-19 NOTE — Progress Notes (Signed)
NEURO HOSPITALIST PROGRESS NOTE   SUBJECTIVE:                                                                                                                        Doing better today. Daughter is at the bedside and said that when she got here today she was still noticing some speech changes, but now seems to be back to baseline. Denies HA, vertigo, double vision, difficulty swallowing, focal weakness or numbness, confusion, or visual disturnaces. MR/MRA brain unremarkable. Cholesterol 142, triglycerides 87, HDL 48, LDL 77.  OBJECTIVE:                                                                                                                           Vital signs in last 24 hours: Temp:  [98.2 F (36.8 C)-98.5 F (36.9 C)] 98.2 F (36.8 C) (09/13 4166) Pulse Rate:  [67-95] 67 (09/13 0638) Resp:  [10-39] 16 (09/13 0638) BP: (106-156)/(61-94) 114/64 mmHg (09/13 0638) SpO2:  [96 %-100 %] 100 % (09/13 0630) Weight:  [63.05 kg (139 lb)] 63.05 kg (139 lb) (09/13 0105)  Intake/Output from previous day: 09/12 0701 - 09/13 0700 In: -  Out: 400 [Urine:400] Intake/Output this shift:   Nutritional status: Carb Control  Past Medical History  Diagnosis Date  . Anxiety   . GERD (gastroesophageal reflux disease)   . Hypertension   . MRSA (methicillin resistant Staphylococcus aureus) 2009  . Depression   . Anemia     Iron deficinecy anemia  . Breast cancer       Neurologic Exam:  Mental Status:  Alert, oriented, thought content appropriate. Speech fluent without evidence of aphasia. Able to follow 3 step commands without difficulty.  Cranial Nerves:  II: Discs flat bilaterally; Visual fields grossly normal, pupils equal, round, reactive to light and accommodation  III,IV, VI: ptosis not present, extra-ocular motions intact bilaterally  V,VII: smile symmetric, facial light touch sensation normal bilaterally  VIII: hearing normal bilaterally   IX,X: gag reflex present  XI: bilateral shoulder shrug  XII: midline tongue extension  Motor:  Right : Upper extremity 5/5 Left: Upper extremity 5/5  Lower extremity 5/5 Lower extremity 5/5  Tone and bulk:normal tone throughout; no atrophy noted  Sensory:  Pinprick and light touch intact throughout, bilaterally  Deep Tendon Reflexes: 2+ in the upper extremities and absent in the lower extremities.  Plantars:  Right: mute Left: mute  Cerebellar:  normal finger-to-nose and normal heel-to-shin test  Gait: Unable to test  CV: pulses palpable throughout    Lab Results: Lab Results  Component Value Date/Time   CHOL 142 07/19/2013  7:17 AM   Lipid Panel  Recent Labs  07/19/13 0717  CHOL 142  TRIG 87  HDL 48  CHOLHDL 3.0  VLDL 17  LDLCALC 77    Studies/Results: Ct Head Wo Contrast  07/18/2013   CLINICAL DATA:  57 year old female with confusion and slurred speech.  EXAM: CT HEAD WITHOUT CONTRAST  TECHNIQUE: Contiguous axial images were obtained from the base of the skull through the vertex without intravenous contrast.  COMPARISON:  None.  FINDINGS: Visualized paranasal sinuses and mastoids are clear. No acute osseous abnormality identified. Visualized orbits and scalp soft tissues are within normal limits.  Cerebral volume is normal. No midline shift, ventriculomegaly, mass effect, evidence of mass lesion, intracranial hemorrhage or evidence of cortically based acute infarction. Gray-white matter differentiation is within normal limits throughout the brain. No suspicious intracranial vascular hyperdensity.  IMPRESSION: Normal noncontrast CT appearance of the brain.   Electronically Signed   By: Augusto Gamble M.D.   On: 07/18/2013 18:40   Mr Maxine Glenn Head Wo Contrast  07/18/2013   *RADIOLOGY REPORT*  Clinical Data: Stroke  MRI HEAD WITHOUT CONTRAST MRA HEAD WITHOUT CONTRAST  Technique:  Multiplanar, multiecho pulse sequences of the brain and surrounding structures were obtained without  intravenous contrast. Angiographic images of the head were obtained using MRA technique without contrast.  Comparison:   CT of the brain performed earlier on the same day at 18 37.  MRI HEAD WITHOUT AND WITH CONTRAST  Findings:  A few scattered foci of hyperintense T2 / FLAIR signal are seen within the periventricular white matter of the right frontal lobe, nonspecific.  Otherwise, no focal parenchymal signal abnormality is seen within the brain parenchyma.  Gray-white matter differentiation is well maintained throughout the brain.  There is no midline shift or mass lesion.  The midline structures are intact.  Corpus callosum is normal.  The craniocervical junction is within normal limits.  No diffusion weighted signal abnormality to suggest acute intracranial infarct is identified.  No acute intracranial hemorrhage is seen.  There is no extra-axial fluid collection.  The orbital soft tissues and optic chiasm are within normal limits.  Mastoid air cells are clear.  Visualized inner ear structures are within normal limits.  Paranasal sinuses are clear.  Calvarium demonstrates a normal appearance with normal signal intensity.  IMPRESSION: Normal noncontrast MRI of the brain with no acute intracranial infarct identified.  MRA HEAD  Findings: The internal carotid arteries are of normal caliber with antegrade flow.  The A1 segments, anterior communicating artery, and anterior cerebral arteries are normal caliber with symmetric antegrade flow.  Minimal irregularity is seen within the right M1 segment without focal high-grade stenosis.  Otherwise, the middle cerebral arteries are normal caliber.  The vertebral arteries, vertebrobasilar junction, and basilar artery are of normal caliber and appearance.  No high-grade stenosis identified.  The posterior cerebral arteries are within normal limits.  The posterior communicating arteries are not well visualized.  No intracranial aneurysm is identified.  IMPRESSION: Normal  noncontrast MRA of the brain without evidence of high-grade stenosis or intracranial aneurysm.   Original Report Authenticated By: Sharlet Salina  Phill Myron, M.D.   Mr Brain Wo Contrast  07/18/2013   *RADIOLOGY REPORT*  Clinical Data: Stroke  MRI HEAD WITHOUT CONTRAST MRA HEAD WITHOUT CONTRAST  Technique:  Multiplanar, multiecho pulse sequences of the brain and surrounding structures were obtained without intravenous contrast. Angiographic images of the head were obtained using MRA technique without contrast.  Comparison:   CT of the brain performed earlier on the same day at 18 37.  MRI HEAD WITHOUT AND WITH CONTRAST  Findings:  A few scattered foci of hyperintense T2 / FLAIR signal are seen within the periventricular white matter of the right frontal lobe, nonspecific.  Otherwise, no focal parenchymal signal abnormality is seen within the brain parenchyma.  Gray-white matter differentiation is well maintained throughout the brain.  There is no midline shift or mass lesion.  The midline structures are intact.  Corpus callosum is normal.  The craniocervical junction is within normal limits.  No diffusion weighted signal abnormality to suggest acute intracranial infarct is identified.  No acute intracranial hemorrhage is seen.  There is no extra-axial fluid collection.  The orbital soft tissues and optic chiasm are within normal limits.  Mastoid air cells are clear.  Visualized inner ear structures are within normal limits.  Paranasal sinuses are clear.  Calvarium demonstrates a normal appearance with normal signal intensity.  IMPRESSION: Normal noncontrast MRI of the brain with no acute intracranial infarct identified.  MRA HEAD  Findings: The internal carotid arteries are of normal caliber with antegrade flow.  The A1 segments, anterior communicating artery, and anterior cerebral arteries are normal caliber with symmetric antegrade flow.  Minimal irregularity is seen within the right M1 segment without focal  high-grade stenosis.  Otherwise, the middle cerebral arteries are normal caliber.  The vertebral arteries, vertebrobasilar junction, and basilar artery are of normal caliber and appearance.  No high-grade stenosis identified.  The posterior cerebral arteries are within normal limits.  The posterior communicating arteries are not well visualized.  No intracranial aneurysm is identified.  IMPRESSION: Normal noncontrast MRA of the brain without evidence of high-grade stenosis or intracranial aneurysm.   Original Report Authenticated By: Rise Mu, M.D.    MEDICATIONS                                                                                                                       I have reviewed the patient's current medications.  ASSESSMENT/PLAN:  57 y/o currently undergoing chemotherapy for breast cancer, transferred to [facility] as a code stroke due to acute onset speech/language impairment, trouble with her though process . Initial NIHSS 1. Normal neuro-imaging. Now back to baseline. Possible TIA. Awaiting CUS and TTE. Continue aspirin, Lipitor, HCTZ. Will follow up.   Wyatt Portela, MD [facility] [phone]  07/19/2013, 9:25 AM

## 2013-07-19 NOTE — H&P (Signed)
[facility] History and Physical  Patient: Kristi [name]  ZOX:[id number]  DOB: [birth year]  DOA: 07/18/2013  Referring physician: Dr. Modesto Charon PCP: Altamese Laredo, MD  Consults: Treatment Team:  Kym Groom, MD   Chief Complaint: Slurred speech  HPI: [name] is a 57 y.o. female with Past medical history of breast cancer currently ongoing with chemotherapy, hypertension, anxiety, GERD. The patient presented today to [facility] long hospital with the complaint of slurred speech. Code stroke was called and the patient was transferred from there to here at [facility].  Patient mentioned that at around 3:00 in the afternoon she started having difficulty finding words. When her daughter tried to talk to her over the phone her speech was not clear. The patient mentions that she has history of anxiety and nervousness. But the daughter mentioned that this presentation was different than her routine anxiety attacks. The patient denied any complaint of headache, blurred vision or dizziness, chest pain, palpitation, any other focal neurological deficit prior to this. The patient also denied any complaint of urinary symptoms incontinence of bowel or bladder. There is no change in her medications recently.  Review of Systems: as mentioned in the history of present illness.  A Comprehensive review of the other systems is negative.  Past Medical History  Diagnosis Date  . Anxiety   . GERD (gastroesophageal reflux disease)   . Hypertension   . MRSA (methicillin resistant Staphylococcus aureus) 2009  . Depression   . Anemia     Iron deficinecy anemia  . Breast cancer    Past Surgical History  Procedure Laterality Date  . Anal sphincterotomy  04/2011  . Hemorrhoid surgery  04/2011    ligation  . Breast lumpectomy with needle localization Left 02/04/2013    Procedure: LEFT BREAST LUMPECTOMY WITH NEEDLE LOCALIZATION;  Surgeon: Almond Lint, MD;  Location: [facility] OR;  Service: General;  Laterality:  Left;  . Axillary lymph node dissection Left 02/04/2013    Procedure: LEFT AXILLARY LYMPH NODE DISSECTION;  Surgeon: Almond Lint, MD;  Location: [facility] OR;  Service: General;  Laterality: Left;  End: 1512  . Portacath placement Right 02/04/2013    Procedure: INSERTION PORT-A-CATH;  Surgeon: Almond Lint, MD;  Location: [facility] OR;  Service: General;  Laterality: Right;  Start Time: 8119.  Marland Kitchen Appendectomy  1980  . Breast surgery      Lumpectomy in april 2014   Social History:  reports that she has never smoked. She has never used smokeless tobacco. She reports that she does not drink alcohol or use illicit drugs. Patient is coming from home. Independent for most of her  ADL.  No Known Allergies  Family History  Problem Relation Age of Onset  . Lung cancer Father   . Hypertension Father   . Thyroid cancer Father     dx in his 20s  . Breast cancer Paternal Aunt 30  . Colon cancer Paternal Aunt     dx in her 50x  . Cervical cancer Paternal Aunt     dzx in her 71s  . Ovarian cancer Cousin     dx in her lage 62s  . Breast cancer Cousin     maternal first cousin, once removed; dx in her late 65s  . Breast cancer Cousin     maternal first cousin once removed; dx in late 63s  . Hypertension Mother   . Diabetes Mother   . Dementia Mother   . Hypertension Brother   . Seizures Brother  Alcohol induced.  . Cancer Paternal Uncle     oral cancer  . Kidney cancer Paternal Grandmother   . Cancer Cousin     several paternal cousins with brain cancer, leukemia, and other cancers    Prior to Admission medications   Medication Sig Start Date End Date Taking? Authorizing Provider  ALPRAZolam Prudy Feeler) 1 MG tablet Take 2 mg by mouth at bedtime as needed for sleep.    Yes Historical Provider, MD  amphetamine-dextroamphetamine (ADDERALL) 10 MG tablet Take 20 mg by mouth daily.    Yes Historical Provider, MD  B Complex-C (SUPER B COMPLEX PO) Take 1 tablet by mouth daily.   Yes Historical Provider, MD   dexamethasone (DECADRON) 4 MG tablet Take 2 tablets (8 mg total) by mouth 2 (two) times daily with a meal. Take two times a day starting the day after chemotherapy for 3 days. 05/07/13  Yes Augustin Schooling, NP  docusate sodium (COLACE) 100 MG capsule Take 100 mg by mouth 2 (two) times daily as needed for constipation.   Yes Historical Provider, MD  escitalopram (LEXAPRO) 20 MG tablet Take 40 mg by mouth daily.    Yes Historical Provider, MD  esomeprazole (NEXIUM) 40 MG capsule Take 40 mg by mouth daily as needed. For reflux   Yes Historical Provider, MD  gabapentin (NEURONTIN) 100 MG capsule Take 1 capsule (100 mg total) by mouth 3 (three) times daily. 06/26/13  Yes Augustin Schooling, NP  lidocaine-prilocaine (EMLA) cream Apply topically as needed. 02/28/13  Yes Victorino December, MD  LORazepam (ATIVAN) 0.5 MG tablet Take 1 tablet (0.5 mg total) by mouth every 6 (six) hours as needed (Nausea or vomiting). 05/07/13  Yes Augustin Schooling, NP  Multiple Vitamin (MULTIVITAMIN) tablet Take 1 tablet by mouth daily.   Yes Historical Provider, MD  Omega-3 Fatty Acids (FISH OIL PO) Take 1 capsule by mouth daily.   Yes Historical Provider, MD  ondansetron (ZOFRAN) 8 MG tablet Take 1 tablet (8 mg total) by mouth 2 (two) times daily. Take two times a day starting the day after chemo for 3 days. Then take two times a day as needed for nausea or vomiting. 05/07/13  Yes Augustin Schooling, NP  OVER THE COUNTER MEDICATION Take by mouth as needed.   Yes Historical Provider, MD  potassium chloride SA (K-DUR,KLOR-CON) 20 MEQ tablet Take 1 tablet (20 mEq total) by mouth 3 (three) times daily. For 5 days then as directed. 06/26/13  Yes Amy Allegra Grana, PA-C  prochlorperazine (COMPAZINE) 10 MG tablet Take 1 tablet (10 mg total) by mouth every 6 (six) hours as needed (Nausea or vomiting). 05/07/13  Yes Augustin Schooling, NP  prochlorperazine (COMPAZINE) 25 MG suppository Place 1 suppository (25 mg total) rectally every 12 (twelve) hours as  needed for nausea. 05/07/13  Yes Augustin Schooling, NP  triamcinolone ointment (KENALOG) 0.5 % Apply topically 2 (two) times daily. 07/04/13  Yes Augustin Schooling, NP  valsartan-hydrochlorothiazide (DIOVAN-HCT) 320-25 MG per tablet Take 0.5 tablets by mouth daily.  01/07/13  Yes Historical Provider, MD    Physical Exam: Filed Vitals:   07/19/13 0000 07/19/13 0015 07/19/13 0030 07/19/13 0105  BP: 127/73 115/77 106/61 111/64  Pulse: 86 79 77 72  Temp:    98.3 F (36.8 C)  TempSrc:    Oral  Resp: 14 11 11 16   Height:    5\' 2"  (1.575 m)  Weight:    63.05 kg (139 lb)  SpO2: 100% 100% 96% 100%  General: Alert, Awake and Oriented to Time, Place and Person. Appear in moderate distress Eyes: PERRL ENT: Oral Mucosa clear moist. Neck: No JVD, no Carotid Bruits  Cardiovascular: S1 and S2 Present,  no Murmur, Peripheral Pulses Present Respiratory: Bilateral Air entry equal and Decreased, Clear to Auscultation,  No Crackles, no wheezes Abdomen: Bowel Sound Present, Soft and Non tender Skin: No Rash Extremities: No Pedal edema, no calf tenderness, bilateral great toe removed healing well Neurologic: Grossly Unremarkable.  Labs on Admission:  CBC:  Recent Labs Lab 07/17/13 1310 07/18/13 1815  WBC 2.7* 2.5*  NEUTROABS 1.3* 1.2*  HGB 10.0* 9.5*  HCT 30.3* 28.6*  MCV 97.6 94.7  PLT 71* 71*    CMP     Component Value Date/Time   NA 138 07/18/2013 1815   NA 139 07/17/2013 1310   K 3.5 07/18/2013 1815   K 3.8 07/17/2013 1310   CL 102 07/18/2013 1815   CL 100 04/23/2013 1447   CO2 29 07/18/2013 1815   CO2 30* 07/17/2013 1310   GLUCOSE 107* 07/18/2013 1815   GLUCOSE 115 07/17/2013 1310   GLUCOSE 111* 04/23/2013 1447   BUN 13 07/18/2013 1815   BUN 18.8 07/17/2013 1310   CREATININE 0.64 07/18/2013 1815   CREATININE 0.8 07/17/2013 1310   CALCIUM 9.9 07/18/2013 1815   CALCIUM 9.5 07/17/2013 1310   PROT 6.3 07/18/2013 1815   PROT 6.2* 07/17/2013 1310   ALBUMIN 3.7 07/18/2013 1815   ALBUMIN 3.6  07/17/2013 1310   AST 26 07/18/2013 1815   AST 22 07/17/2013 1310   ALT 36* 07/18/2013 1815   ALT 42 07/17/2013 1310   ALKPHOS 41 07/18/2013 1815   ALKPHOS 42 07/17/2013 1310   BILITOT 0.3 07/18/2013 1815   BILITOT 0.38 07/17/2013 1310   GFRNONAA >90 07/18/2013 1815   GFRAA >90 07/18/2013 1815    No results found for this basename: LIPASE, AMYLASE,  in the last 168 hours No results found for this basename: AMMONIA,  in the last 168 hours  Cardiac Enzymes: No results found for this basename: CKTOTAL, CKMB, CKMBINDEX, TROPONINI,  in the last 168 hours  BNP (last 3 results) No results found for this basename: PROBNP,  in the last 8760 hours  Radiological Exams on Admission: Ct Head Wo Contrast  07/18/2013   CLINICAL DATA:  57 year old female with confusion and slurred speech.  EXAM: CT HEAD WITHOUT CONTRAST  TECHNIQUE: Contiguous axial images were obtained from the base of the skull through the vertex without intravenous contrast.  COMPARISON:  None.  FINDINGS: Visualized paranasal sinuses and mastoids are clear. No acute osseous abnormality identified. Visualized orbits and scalp soft tissues are within normal limits.  Cerebral volume is normal. No midline shift, ventriculomegaly, mass effect, evidence of mass lesion, intracranial hemorrhage or evidence of cortically based acute infarction. Gray-white matter differentiation is within normal limits throughout the brain. No suspicious intracranial vascular hyperdensity.  IMPRESSION: Normal noncontrast CT appearance of the brain.   Electronically Signed   By: Augusto Gamble M.D.   On: 07/18/2013 18:40   Mr Maxine Glenn Head Wo Contrast  07/18/2013   *RADIOLOGY REPORT*  Clinical Data: Stroke  MRI HEAD WITHOUT CONTRAST MRA HEAD WITHOUT CONTRAST  Technique:  Multiplanar, multiecho pulse sequences of the brain and surrounding structures were obtained without intravenous contrast. Angiographic images of the head were obtained using MRA technique without contrast.   Comparison:   CT of the brain performed earlier on the same day at 18 37.  MRI  HEAD WITHOUT AND WITH CONTRAST  Findings:  A few scattered foci of hyperintense T2 / FLAIR signal are seen within the periventricular white matter of the right frontal lobe, nonspecific.  Otherwise, no focal parenchymal signal abnormality is seen within the brain parenchyma.  Gray-white matter differentiation is well maintained throughout the brain.  There is no midline shift or mass lesion.  The midline structures are intact.  Corpus callosum is normal.  The craniocervical junction is within normal limits.  No diffusion weighted signal abnormality to suggest acute intracranial infarct is identified.  No acute intracranial hemorrhage is seen.  There is no extra-axial fluid collection.  The orbital soft tissues and optic chiasm are within normal limits.  Mastoid air cells are clear.  Visualized inner ear structures are within normal limits.  Paranasal sinuses are clear.  Calvarium demonstrates a normal appearance with normal signal intensity.  IMPRESSION: Normal noncontrast MRI of the brain with no acute intracranial infarct identified.  MRA HEAD  Findings: The internal carotid arteries are of normal caliber with antegrade flow.  The A1 segments, anterior communicating artery, and anterior cerebral arteries are normal caliber with symmetric antegrade flow.  Minimal irregularity is seen within the right M1 segment without focal high-grade stenosis.  Otherwise, the middle cerebral arteries are normal caliber.  The vertebral arteries, vertebrobasilar junction, and basilar artery are of normal caliber and appearance.  No high-grade stenosis identified.  The posterior cerebral arteries are within normal limits.  The posterior communicating arteries are not well visualized.  No intracranial aneurysm is identified.  IMPRESSION: Normal noncontrast MRA of the brain without evidence of high-grade stenosis or intracranial aneurysm.   Original Report  Authenticated By: Rise Mu, M.D.   Mr Brain Wo Contrast  07/18/2013   *RADIOLOGY REPORT*  Clinical Data: Stroke  MRI HEAD WITHOUT CONTRAST MRA HEAD WITHOUT CONTRAST  Technique:  Multiplanar, multiecho pulse sequences of the brain and surrounding structures were obtained without intravenous contrast. Angiographic images of the head were obtained using MRA technique without contrast.  Comparison:   CT of the brain performed earlier on the same day at 18 37.  MRI HEAD WITHOUT AND WITH CONTRAST  Findings:  A few scattered foci of hyperintense T2 / FLAIR signal are seen within the periventricular white matter of the right frontal lobe, nonspecific.  Otherwise, no focal parenchymal signal abnormality is seen within the brain parenchyma.  Gray-white matter differentiation is well maintained throughout the brain.  There is no midline shift or mass lesion.  The midline structures are intact.  Corpus callosum is normal.  The craniocervical junction is within normal limits.  No diffusion weighted signal abnormality to suggest acute intracranial infarct is identified.  No acute intracranial hemorrhage is seen.  There is no extra-axial fluid collection.  The orbital soft tissues and optic chiasm are within normal limits.  Mastoid air cells are clear.  Visualized inner ear structures are within normal limits.  Paranasal sinuses are clear.  Calvarium demonstrates a normal appearance with normal signal intensity.  IMPRESSION: Normal noncontrast MRI of the brain with no acute intracranial infarct identified.  MRA HEAD  Findings: The internal carotid arteries are of normal caliber with antegrade flow.  The A1 segments, anterior communicating artery, and anterior cerebral arteries are normal caliber with symmetric antegrade flow.  Minimal irregularity is seen within the right M1 segment without focal high-grade stenosis.  Otherwise, the middle cerebral arteries are normal caliber.  The vertebral arteries, vertebrobasilar  junction, and basilar artery are of  normal caliber and appearance.  No high-grade stenosis identified.  The posterior cerebral arteries are within normal limits.  The posterior communicating arteries are not well visualized.  No intracranial aneurysm is identified.  IMPRESSION: Normal noncontrast MRA of the brain without evidence of high-grade stenosis or intracranial aneurysm.   Original Report Authenticated By: Rise Mu, M.D.    EKG: Independently reviewed. nonspecific ST and T waves changes.  Assessment/Plan Principal Problem:   CVA (cerebral infarction) Active Problems:   Cancer of upper-inner quadrant of female breast   TIA (transient ischemic attack)   1. CVA (cerebral infarction) The patient initially presented with slurred speech and has undergone a CT head and MRI of the head which are negative for any acute stroke. Patient has been evaluated by the neurologist and does not appear to be within the window period for TPA. At present patient's symptom has nearly resolved. Patient will be admitted for observation. Frequent neuro checks, carotid Dopplers, serial troponins, monitoring on telemetry, 2-D echocardiogram, hemoglobin A1c and lipid panel in the morning we will do.  2. Hypertension Resuming her antihypertensive tomorrow. Allowing permissive hypertension at present.  3. Thrombo-Cytopenia Stable, likely secondary to chemotherapy. Low dose aspirin. No active bleeding.  4 Anxiety. Continue Ativan  DVT Prophylaxis: mechanical compression device Nutrition: Cardiac diet  Code Status: Full  Family Communication: Daughter and spouse was present at bedside, opportunity was given to the family to ask question and all questions were answered satisfactorily at the time of interview. Disposition: Admitted to observation telemetry.  Author: Lynden Oxford, MD [facility] Pager: [phone] 07/19/2013, 2:00 AM    If 7PM-7AM, please contact  night-coverage [email or web address] Password [email or web address]

## 2013-07-19 NOTE — Evaluation (Signed)
Physical Therapy Evaluation Patient Details Name: TY OSHIMA MRN: [id number] DOB: [birth year] Today's Date: 07/19/2013 Time: 7829-5621 PT Time Calculation (min): 23 min  PT Assessment / Plan / Recommendation History of Present Illness  Patient is a 57 y.o. female with Past medical history of breast cancer currently ongoing with chemotherapy, hypertension, anxiety, GERD.  Patient presented with slurred speech.  Admitted to r/o TIA/CVA.  MRI negative.  Clinical Impression  Patient presents with problems listed below.  Will benefit from acute PT to maximize safety/independence with ambulation.  Recommend f/u OP PT for balance training.  Recommended patient use her RW/cane for safety/balance at discharge.    PT Assessment  Patient needs continued PT services    Follow Up Recommendations  Outpatient PT;Supervision - Intermittent (OP PT for balance training - ? during vs after chemo)    Does the patient have the potential to tolerate intense rehabilitation      Barriers to Discharge Decreased caregiver support Home alone for part of the day.    Equipment Recommendations  None recommended by PT    Recommendations for Other Services     Frequency Min 3X/week    Precautions / Restrictions Precautions Precautions: Fall Restrictions Weight Bearing Restrictions: No   Pertinent Vitals/Pain Pain in toes from toenail removal bil.      Mobility  Bed Mobility Bed Mobility: Supine to Sit;Sit to Supine Supine to Sit: 7: Independent;HOB flat Sit to Supine: 7: Independent;HOB flat Details for Bed Mobility Assistance: No cues or assist needed. Transfers Transfers: Sit to Stand;Stand to Sit Sit to Stand: 5: Supervision;From bed Stand to Sit: 5: Supervision;To bed Details for Transfer Assistance: Supervision for safety and balance. Ambulation/Gait Ambulation/Gait Assistance: 4: Min guard;4: Min Environmental consultant (Feet): 120 Feet Assistive device: None Ambulation/Gait  Assistance Details: Patient with loss of balance during gait, especially when distracted.  Required assist to maintain balance x3.  On other occasions, able to self-correct by staggering.  Loss of balance occurred to both sides. Gait Pattern: Step-through pattern;Decreased stride length (Staggering) Gait velocity: Slow gait speed Modified Rankin (Stroke Patients Only) Pre-Morbid Rankin Score: No significant disability Modified Rankin: Moderately severe disability (Needs supervision for balance)    Exercises     PT Diagnosis: Difficulty walking;Abnormality of gait;Generalized weakness;Acute pain  PT Problem List: Decreased strength;Decreased activity tolerance;Decreased balance;Decreased mobility;Decreased knowledge of use of DME;Impaired sensation;Pain PT Treatment Interventions: DME instruction;Gait training;Stair training;Functional mobility training;Balance training;Patient/family education     PT Goals(Current goals can be found in the care plan section) Acute Rehab PT Goals Patient Stated Goal: To go home soon PT Goal Formulation: With patient Time For Goal Achievement: 07/26/13 Potential to Achieve Goals: Good  Visit Information  Last PT Received On: 07/19/13 Assistance Needed: +1 History of Present Illness: Patient is a 57 y.o. female with Past medical history of breast cancer currently ongoing with chemotherapy, hypertension, anxiety, GERD.  Patient presented with slurred speech.  Admitted to r/o TIA/CVA.  MRI negative.       Prior Functioning  Home Living Family/patient expects to be discharged to:: Private residence Living Arrangements: Spouse/significant other Available Help at Discharge: Family;Available PRN/intermittently (Husband works) Type of Home: House Home Access: Stairs to enter Secretary/administrator of Steps: 3 Entrance Stairs-Rails: None Home Layout: Two level;Bed/bath upstairs Alternate Level Stairs-Number of Steps: flight Alternate Level Stairs-Rails:  Right Home Equipment: Walker - 2 wheels;Cane - single point Prior Function Level of Independence: Independent Communication Communication: No difficulties    Cognition  Cognition Arousal/Alertness: Awake/alert Behavior  During Therapy: WFL for tasks assessed/performed Overall Cognitive Status: Within Functional Limits for tasks assessed    Extremity/Trunk Assessment Upper Extremity Assessment Upper Extremity Assessment: Overall WFL for tasks assessed (Sensory changes in fingertips per patient) Lower Extremity Assessment Lower Extremity Assessment: Generalized weakness (Painful great toes; decreased sensation to light touch)   Balance Balance Balance Assessed: Yes High Level Balance High Level Balance Activites: Direction changes;Turns;Sudden stops;Head turns High Level Balance Comments: Note loss of balance with any high level activity during gait.  End of Session PT - End of Session Equipment Utilized During Treatment: Gait belt Activity Tolerance: Patient limited by fatigue;Patient limited by pain Patient left: in bed;with call bell/phone within reach;with bed alarm set;with family/visitor present Nurse Communication: Mobility status  GP     Vena Austria 07/19/2013, 10:50 AM Durenda Hurt. Renaldo Fiddler, [facility] Acute Rehab Services Pager [phone]

## 2013-07-19 NOTE — Progress Notes (Signed)
[facility] PROGRESS NOTE  CYANNE DELMAR AVW:[id number] DOB: 06-01-1956 DOA: 07/18/2013 PCP: Altamese [place], [place]  Assessment/Plan: 57 y.o. female has a past medical history significant for breast cancer diagnosed early 2014 (invasive ductal carcinoma) status post left breast lumpectomy and axillary lymph node dissection positive for metastatic disease and undergoing chemotherapy currently presents to the emergency room with a chief complaint of difficulty finding words of sudden onset today at 3 PM.  1. altered speech possible TIA; MRI/MRA unremarkable;  -clinically close to her baseline;  -awaiting echo, doppler to complete and neurology clearance  -? Benzo+adderall effect   2. Breast cancer diagnosed early 2014 (invasive ductal carcinoma) status post left breast lumpectomy and axillary lymph node dissection positive for metastatic disease and undergoing chemotherapy -cont monitoring; f/u with hem/onc   3. HTN stable  -hypo K likely diuretic induced; replace   4. Anemia likely chronic disease cancer/chemo induced;  -no s/s of acute bleeding; monitor   Code Status: full  Family Communication: daughter at the bedside  (indicate person spoken with, relationship, and if by phone, the number) Disposition Plan: home    Consultants:  Neurology   Procedures:  MRI head   Antibiotics:  No  (indicate start date, and stop date if known)  HPI/Subjective: Alert, oriented   Objective: Filed Vitals:   07/19/13 1001  BP: 114/66  Pulse: 83  Temp: 98.2 F (36.8 C)  Resp: 16    Intake/Output Summary (Last 24 hours) at 07/19/13 1440 Last data filed at 07/19/13 1134  Gross per 24 hour  Intake    480 ml  Output    400 ml  Net     80 ml   Filed Weights   07/19/13 0105  Weight: 63.05 kg (139 lb)    Exam:   General:  Alert,   Cardiovascular: S1, S2 rrr  Respiratory: CTA bl   Abdomen: soft, NT, ND   Musculoskeletal: no LE edema    Data Reviewed: Basic  Metabolic Panel:  Recent Labs Lab 07/17/13 1310 07/18/13 1815 07/19/13 0717  NA 139 138 139  K 3.8 3.5 3.4*  CL  --  102 103  CO2 30* 29 27  GLUCOSE 115 107* 96  BUN 18.8 13 11   CREATININE 0.8 0.64 0.62  CALCIUM 9.5 9.9 9.2   Liver Function Tests:  Recent Labs Lab 07/18/13 1815 07/19/13 0717  AST 26 20  ALT 36* 28  ALKPHOS 41 32*  BILITOT 0.3 0.2*  PROT 6.3 5.1*  ALBUMIN 3.7 3.1*   No results found for this basename: LIPASE, AMYLASE,  in the last 168 hours No results found for this basename: AMMONIA,  in the last 168 hours CBC:  Recent Labs Lab 07/17/13 1310 07/18/13 1815  WBC 2.7* 2.5*  NEUTROABS 1.3* 1.2*  HGB 10.0* 9.5*  HCT 30.3* 28.6*  MCV 97.6 94.7  PLT 71* 71*   Cardiac Enzymes:  Recent Labs Lab 07/19/13 0717  TROPONINI <0.30   BNP (last 3 results) No results found for this basename: PROBNP,  in the last 8760 hours CBG:  Recent Labs Lab 07/18/13 1826 07/19/13 0136 07/19/13 0725 07/19/13 1226  GLUCAP 113* 103* 98 115*    Recent Results (from the past 240 hour(s))  MRSA PCR SCREENING     Status: None   Collection Time    07/19/13  2:11 AM      Result Value Range Status   MRSA by PCR NEGATIVE  NEGATIVE Final   Comment:  The GeneXpert MRSA Assay (FDA     approved for NASAL specimens     only), is one component of a     comprehensive MRSA colonization     surveillance program. It is not     intended to diagnose MRSA     infection nor to guide or     monitor treatment for     MRSA infections.     Studies: Ct Head Wo Contrast  07/18/2013   CLINICAL DATA:  57 year old female with confusion and slurred speech.  EXAM: CT HEAD WITHOUT CONTRAST  TECHNIQUE: Contiguous axial images were obtained from the base of the skull through the vertex without intravenous contrast.  COMPARISON:  None.  FINDINGS: Visualized paranasal sinuses and mastoids are clear. No acute osseous abnormality identified. Visualized orbits and scalp soft  tissues are within normal limits.  Cerebral volume is normal. No midline shift, ventriculomegaly, mass effect, evidence of mass lesion, intracranial hemorrhage or evidence of cortically based acute infarction. Gray-white matter differentiation is within normal limits throughout the brain. No suspicious intracranial vascular hyperdensity.  IMPRESSION: Normal noncontrast CT appearance of the brain.   Electronically Signed   By: Augusto Gamble M.D.   On: 07/18/2013 18:40   Mr Maxine Glenn Head Wo Contrast  07/18/2013   *RADIOLOGY REPORT*  Clinical Data: Stroke  MRI HEAD WITHOUT CONTRAST MRA HEAD WITHOUT CONTRAST  Technique:  Multiplanar, multiecho pulse sequences of the brain and surrounding structures were obtained without intravenous contrast. Angiographic images of the head were obtained using MRA technique without contrast.  Comparison:   CT of the brain performed earlier on the same day at 18 37.  MRI HEAD WITHOUT AND WITH CONTRAST  Findings:  A few scattered foci of hyperintense T2 / FLAIR signal are seen within the periventricular white matter of the right frontal lobe, nonspecific.  Otherwise, no focal parenchymal signal abnormality is seen within the brain parenchyma.  Gray-white matter differentiation is well maintained throughout the brain.  There is no midline shift or mass lesion.  The midline structures are intact.  Corpus callosum is normal.  The craniocervical junction is within normal limits.  No diffusion weighted signal abnormality to suggest acute intracranial infarct is identified.  No acute intracranial hemorrhage is seen.  There is no extra-axial fluid collection.  The orbital soft tissues and optic chiasm are within normal limits.  Mastoid air cells are clear.  Visualized inner ear structures are within normal limits.  Paranasal sinuses are clear.  Calvarium demonstrates a normal appearance with normal signal intensity.  IMPRESSION: Normal noncontrast MRI of the brain with no acute intracranial infarct  identified.  MRA HEAD  Findings: The internal carotid arteries are of normal caliber with antegrade flow.  The A1 segments, anterior communicating artery, and anterior cerebral arteries are normal caliber with symmetric antegrade flow.  Minimal irregularity is seen within the right M1 segment without focal high-grade stenosis.  Otherwise, the middle cerebral arteries are normal caliber.  The vertebral arteries, vertebrobasilar junction, and basilar artery are of normal caliber and appearance.  No high-grade stenosis identified.  The posterior cerebral arteries are within normal limits.  The posterior communicating arteries are not well visualized.  No intracranial aneurysm is identified.  IMPRESSION: Normal noncontrast MRA of the brain without evidence of high-grade stenosis or intracranial aneurysm.   Original Report Authenticated By: Rise Mu, M.D.   Mr Brain Wo Contrast  07/18/2013   *RADIOLOGY REPORT*  Clinical Data: Stroke  MRI HEAD WITHOUT CONTRAST MRA HEAD  WITHOUT CONTRAST  Technique:  Multiplanar, multiecho pulse sequences of the brain and surrounding structures were obtained without intravenous contrast. Angiographic images of the head were obtained using MRA technique without contrast.  Comparison:   CT of the brain performed earlier on the same day at 18 37.  MRI HEAD WITHOUT AND WITH CONTRAST  Findings:  A few scattered foci of hyperintense T2 / FLAIR signal are seen within the periventricular white matter of the right frontal lobe, nonspecific.  Otherwise, no focal parenchymal signal abnormality is seen within the brain parenchyma.  Gray-white matter differentiation is well maintained throughout the brain.  There is no midline shift or mass lesion.  The midline structures are intact.  Corpus callosum is normal.  The craniocervical junction is within normal limits.  No diffusion weighted signal abnormality to suggest acute intracranial infarct is identified.  No acute intracranial  hemorrhage is seen.  There is no extra-axial fluid collection.  The orbital soft tissues and optic chiasm are within normal limits.  Mastoid air cells are clear.  Visualized inner ear structures are within normal limits.  Paranasal sinuses are clear.  Calvarium demonstrates a normal appearance with normal signal intensity.  IMPRESSION: Normal noncontrast MRI of the brain with no acute intracranial infarct identified.  MRA HEAD  Findings: The internal carotid arteries are of normal caliber with antegrade flow.  The A1 segments, anterior communicating artery, and anterior cerebral arteries are normal caliber with symmetric antegrade flow.  Minimal irregularity is seen within the right M1 segment without focal high-grade stenosis.  Otherwise, the middle cerebral arteries are normal caliber.  The vertebral arteries, vertebrobasilar junction, and basilar artery are of normal caliber and appearance.  No high-grade stenosis identified.  The posterior cerebral arteries are within normal limits.  The posterior communicating arteries are not well visualized.  No intracranial aneurysm is identified.  IMPRESSION: Normal noncontrast MRA of the brain without evidence of high-grade stenosis or intracranial aneurysm.   Original Report Authenticated By: Rise Mu, M.D.    Scheduled Meds: . amphetamine-dextroamphetamine  20 mg Oral Daily  . aspirin EC  81 mg Oral Daily  . atorvastatin  40 mg Oral q1800  . escitalopram  40 mg Oral Daily  . gabapentin  100 mg Oral TID  . hydrochlorothiazide  25 mg Oral Daily  . irbesartan  300 mg Oral Daily  . ondansetron  8 mg Oral BID  . pantoprazole  40 mg Oral Daily   Continuous Infusions:   Principal Problem:   CVA (cerebral infarction) Active Problems:   Cancer of upper-inner quadrant of female breast   TIA (transient ischemic attack)    Time spent: >25 minutes     Esperanza Sheets  [facility] Pager [phone]. If 7PM-7AM, please contact  night-coverage at [email or web address], password [facility] 07/19/2013, 2:40 PM  LOS: 1 day

## 2013-07-20 DIAGNOSIS — I10 Essential (primary) hypertension: Secondary | ICD-10-CM

## 2013-07-20 DIAGNOSIS — G459 Transient cerebral ischemic attack, unspecified: Secondary | ICD-10-CM

## 2013-07-20 DIAGNOSIS — D649 Anemia, unspecified: Secondary | ICD-10-CM

## 2013-07-20 LAB — GLUCOSE, CAPILLARY
Glucose-Capillary: 103 mg/dL — ABNORMAL HIGH (ref 70–99)
Glucose-Capillary: 153 mg/dL — ABNORMAL HIGH (ref 70–99)

## 2013-07-20 LAB — BASIC METABOLIC PANEL
BUN: 8 mg/dL (ref 6–23)
CO2: 26 mEq/L (ref 19–32)
Glucose, Bld: 95 mg/dL (ref 70–99)
Potassium: 3.7 mEq/L (ref 3.5–5.1)
Sodium: 139 mEq/L (ref 135–145)

## 2013-07-20 MED ORDER — ALPRAZOLAM 0.5 MG PO TABS
1.0000 mg | ORAL_TABLET | Freq: Once | ORAL | Status: AC
  Administered 2013-07-20: 1 mg via ORAL
  Filled 2013-07-20: qty 2

## 2013-07-20 NOTE — Progress Notes (Signed)
   CARE MANAGEMENT NOTE 07/20/2013  Patient:  KARREN, NEWLAND   Account Number:  [id number]  Date Initiated:  07/20/2013  Documentation initiated by:  [facility]  Subjective/Objective Assessment:   adm:CVA (cerebral infarction)     Action/Plan:   discharge planning   Anticipated DC Date:  07/20/2013   Anticipated DC Plan:  HOME/SELF CARE      DC Planning Services  CM consult      Choice offered to / List presented to:             Status of service:  Completed, signed off Medicare Important Message given?   (If response is "NO", the following Medicare IM given date fields will be blank) Date Medicare IM given:   Date Additional Medicare IM given:    Discharge Disposition:  HOME/SELF CARE  Per UR Regulation:    If discussed at Long Length of Stay Meetings, dates discussed:    Comments:  07/20/13 16:00 CM met with pt, pt's husband, and pt's daughter in room and gave them handout for the [facility] for outpt therapy.  Pt will have paper prescription at discharge.  Referral was faxed to [facility].  RN stated MD did not want home health PT but outpt rehab instead.  No other CM needs were communicated.  Freddy Jaksch, BSN, CM [phone].

## 2013-07-20 NOTE — Progress Notes (Signed)
Physical Therapy Treatment Patient Details Name: Kristi [name] MRN: [id number] DOB: 1956/10/29 Today's Date: 07/20/2013 Time: 4098-1191 PT Time Calculation (min): 16 min  PT Assessment / Plan / Recommendation  History of Present Illness Patient is a 57 y.o. female with Past medical history of breast cancer currently ongoing with chemotherapy, hypertension, anxiety, GERD.  Patient presented with slurred speech.  Admitted to r/o TIA/CVA.  MRI negative.   PT Comments   Patient progressing with gait.  Continues to have decreased balance - improved with use of RW.  Patient did well on stairs.  Ready for discharge from PT perspective with f/u OP PT for balance training.  Follow Up Recommendations  Supervision - Intermittent;Outpatient PT (OP PT for balance training)     Does the patient have the potential to tolerate intense rehabilitation     Barriers to Discharge        Equipment Recommendations  None recommended by PT    Recommendations for Other Services    Frequency Min 3X/week   Progress towards PT Goals Progress towards PT goals: Progressing toward goals  Plan Current plan remains appropriate    Precautions / Restrictions Precautions Precautions: Fall Restrictions Weight Bearing Restrictions: No   Pertinent Vitals/Pain     Mobility  Bed Mobility Bed Mobility: Supine to Sit;Sit to Supine Supine to Sit: 7: Independent;HOB flat Sit to Supine: 7: Independent;HOB flat Details for Bed Mobility Assistance: No cues or assist needed. Transfers Transfers: Sit to Stand;Stand to Sit Sit to Stand: 5: Supervision;From bed Stand to Sit: 5: Supervision;To bed Details for Transfer Assistance: Supervision for safety and balance. Ambulation/Gait Ambulation/Gait Assistance: 4: Min guard Ambulation Distance (Feet): 200 Feet Assistive device: Rolling walker Ambulation/Gait Assistance Details: Verbal cues for safe use of RW.  Patient with good balance with use of RW. Gait Pattern:  Step-through pattern;Decreased stride length Gait velocity: Slow gait speed Stairs: Yes Stairs Assistance: 4: Min guard Stair Management Technique: One rail Right;Alternating pattern;Forwards Number of Stairs: 4 Modified Rankin (Stroke Patients Only) Pre-Morbid Rankin Score: No significant disability Modified Rankin: Moderately severe disability (Supervision for balance)     PT Goals (current goals can now be found in the care plan section)    Visit Information  Last PT Received On: 07/20/13 Assistance Needed: +1 History of Present Illness: Patient is a 57 y.o. female with Past medical history of breast cancer currently ongoing with chemotherapy, hypertension, anxiety, GERD.  Patient presented with slurred speech.  Admitted to r/o TIA/CVA.  MRI negative.    Subjective Data  Subjective: I hope to go home today   Cognition  Cognition Arousal/Alertness: Awake/alert Behavior During Therapy: WFL for tasks assessed/performed Overall Cognitive Status: Within Functional Limits for tasks assessed    Balance     End of Session PT - End of Session Equipment Utilized During Treatment: Gait belt Activity Tolerance: Patient limited by fatigue Patient left: in bed;with call bell/phone within reach;with family/visitor present Nurse Communication: Mobility status   GP     Vena Austria 07/20/2013, 9:25 AM Durenda Hurt. Renaldo Fiddler, [facility] Acute Rehab Services Pager [phone]

## 2013-07-20 NOTE — Evaluation (Signed)
Occupational Therapy Evaluation and Discharge Patient Details Name: AMAYAH STAHELI MRN: [id number] DOB: June 26, 1956 Today's Date: 07/20/2013 Time: 1478-2956 OT Time Calculation (min): 10 min  OT Assessment / Plan / Recommendation History of present illness Patient is a 56 y.o. female with Past medical history of breast cancer currently ongoing with chemotherapy, hypertension, anxiety, GERD.  Patient presented with slurred speech.  Admitted to r/o TIA/CVA.  MRI negative.   Clinical Impression   This 57 yo admitted with above. All education/recommendations made. Will D/C from acute OT.    OT Assessment  Patient does not need any further OT services    Follow Up Recommendations  No OT follow up       Equipment Recommendations  None recommended by OT          Precautions / Restrictions Precautions Precautions: Fall Restrictions Weight Bearing Restrictions: No       ADL  Transfers/Ambulation Related to ADLs: Mod I with RW ADL Comments: Mod I as long as she uses RW. Recommended she use a shower seat or a plastic garden chair in her shower due to balance deficts to avoid falling--she and daughter in room verbalized understanding  Educated pt and daughter on signs/symptoms of stroke         Visit Information  Last OT Received On: 07/20/13 Assistance Needed: +1 History of Present Illness: Patient is a 57 y.o. female with Past medical history of breast cancer currently ongoing with chemotherapy, hypertension, anxiety, GERD.  Patient presented with slurred speech.  Admitted to r/o TIA/CVA.  MRI negative.       Prior Functioning     Home Living Family/patient expects to be discharged to:: Private residence Living Arrangements: Spouse/significant other Available Help at Discharge: Family;Available PRN/intermittently Type of Home: House Home Access: Stairs to enter [employer] of Steps: 3 Entrance Stairs-Rails: None Home Layout: Two level;Bed/bath  upstairs Alternate Level Stairs-Number of Steps: flight Alternate Level Stairs-Rails: Right Home Equipment: Walker - 2 wheels;Cane - single point Prior Function Level of Independence: Independent Communication Communication: No difficulties Dominant Hand: Right         Vision/Perception Vision - History Patient Visual Report: No change from baseline   Cognition  Cognition Arousal/Alertness: Awake/alert Behavior During Therapy: WFL for tasks assessed/performed Overall Cognitive Status: Within Functional Limits for tasks assessed    Extremity/Trunk Assessment Upper Extremity Assessment Upper Extremity Assessment: Overall WFL for tasks assessed     Mobility Bed Mobility Bed Mobility: Supine to Sit;Sit to Supine Supine to Sit: 7: Independent;HOB flat Sit to Supine: 7: Independent;HOB flat Details for Bed Mobility Assistance: Pt up in bathroom upon my arrival with RW; NT in room waiting on her Transfers Transfers: Sit to Stand Sit to Stand: 5: Supervision;From bed Stand to Sit: 6: Modified independent (Device/Increase time);With upper extremity assist;To bed Details for Transfer Assistance: Supervision for safety and balance.           End of Session OT - End of Session Equipment Utilized During Treatment: Rolling walker Activity Tolerance: Patient tolerated treatment well Patient left: with family/visitor present (sitting EOB)       Evette Georges [phone] 07/20/2013, 12:58 PM

## 2013-07-20 NOTE — Progress Notes (Signed)
Pt. DC home by car with family.  Assessment stable.  Prescriptions given and phone number to Out Patient PT given along with directions.  Pt. Verbalizes understanding of DC instructions.

## 2013-07-20 NOTE — Progress Notes (Signed)
[facility] PROGRESS NOTE  Kristi [name] ZOX:[id number] DOB: [birth year] DOA: 07/18/2013 PCP: [name], MD  Assessment/Plan: 57 y.o. female has a past medical history significant for breast cancer diagnosed early 2014 (invasive ductal carcinoma) status post left breast lumpectomy and axillary lymph node dissection positive for metastatic disease and undergoing chemotherapy currently presents to the emergency room with a chief complaint of difficulty finding words of sudden onset today at 3 PM.  1. altered speech possible TIA; MRI/MRA unremarkable; vs medication side effect ? Benzo+adderall -clinically close to her baseline;  -awaiting echo, doppler to complete and neurology clearance  -outpatient PT upon d/c   2. Breast cancer diagnosed early 2014 (invasive ductal carcinoma) status post left breast lumpectomy and axillary lymph node dissection positive for metastatic disease and undergoing chemotherapy -cont monitoring; f/u with hem/onc   3. HTN stable  -hypo K likely diuretic induced; replace   4. Anemia likely chronic disease cancer/chemo induced;  -no s/s of acute bleeding; monitor   Likely d/c today   Code Status: full  Family Communication: daughter at the bedside  (indicate person spoken with, relationship, and if by phone, the number) Disposition Plan: home    Consultants:  Neurology   Procedures:  MRI head   Antibiotics:  No  (indicate start date, and stop date if known)  HPI/Subjective: Alert, oriented   Objective: Filed Vitals:   07/20/13 0600  BP: 114/68  Pulse: 61  Temp: 97.9 F (36.6 C)  Resp: 16    Intake/Output Summary (Last 24 hours) at 07/20/13 0828 Last data filed at 07/20/13 0816  Gross per 24 hour  Intake    960 ml  Output      0 ml  Net    960 ml   Filed Weights   07/19/13 0105  Weight: 63.05 kg (139 lb)    Exam:   General:  Alert,   Cardiovascular: S1, S2 rrr  Respiratory: CTA bl   Abdomen: soft, NT, ND    Musculoskeletal: no LE edema    Data Reviewed: Basic Metabolic Panel:  Recent Labs Lab 07/17/13 1310 07/18/13 1815 07/19/13 0717 07/20/13 0557  NA 139 138 139 139  K 3.8 3.5 3.4* 3.7  CL  --  102 103 106  CO2 30* 29 27 26   GLUCOSE 115 107* 96 95  BUN 18.8 13 11 8   CREATININE 0.8 0.64 0.62 0.58  CALCIUM 9.5 9.9 9.2 9.1   Liver Function Tests:  Recent Labs Lab 07/18/13 1815 07/19/13 0717  AST 26 20  ALT 36* 28  ALKPHOS 41 32*  BILITOT 0.3 0.2*  PROT 6.3 5.1*  ALBUMIN 3.7 3.1*   No results found for this basename: LIPASE, AMYLASE,  in the last 168 hours No results found for this basename: AMMONIA,  in the last 168 hours CBC:  Recent Labs Lab 07/17/13 1310 07/18/13 1815  WBC 2.7* 2.5*  NEUTROABS 1.3* 1.2*  HGB 10.0* 9.5*  HCT 30.3* 28.6*  MCV 97.6 94.7  PLT 71* 71*   Cardiac Enzymes:  Recent Labs Lab 07/19/13 0717  TROPONINI <0.30   BNP (last 3 results) No results found for this basename: PROBNP,  in the last 8760 hours CBG:  Recent Labs Lab 07/18/13 1826 07/19/13 0136 07/19/13 0725 07/19/13 1226 07/19/13 1656  GLUCAP 113* 103* 98 115* 105*    Recent Results (from the past 240 hour(s))  MRSA PCR SCREENING     Status: None   Collection Time    07/19/13  2:11 AM  Result Value Range Status   MRSA by PCR NEGATIVE  NEGATIVE Final   Comment:            The GeneXpert MRSA Assay (FDA     approved for NASAL specimens     only), is one component of a     comprehensive MRSA colonization     surveillance program. It is not     intended to diagnose MRSA     infection nor to guide or     monitor treatment for     MRSA infections.     Studies: Ct Head Wo Contrast  07/18/2013   CLINICAL DATA:  57 year old female with confusion and slurred speech.  EXAM: CT HEAD WITHOUT CONTRAST  TECHNIQUE: Contiguous axial images were obtained from the base of the skull through the vertex without intravenous contrast.  COMPARISON:  None.  FINDINGS:  Visualized paranasal sinuses and mastoids are clear. No acute osseous abnormality identified. Visualized orbits and scalp soft tissues are within normal limits.  Cerebral volume is normal. No midline shift, ventriculomegaly, mass effect, evidence of mass lesion, intracranial hemorrhage or evidence of cortically based acute infarction. Gray-white matter differentiation is within normal limits throughout the brain. No suspicious intracranial vascular hyperdensity.  IMPRESSION: Normal noncontrast CT appearance of the brain.   Electronically Signed   By: Augusto Gamble M.D.   On: 07/18/2013 18:40   Mr Maxine Glenn Head Wo Contrast  07/18/2013   *RADIOLOGY REPORT*  Clinical Data: Stroke  MRI HEAD WITHOUT CONTRAST MRA HEAD WITHOUT CONTRAST  Technique:  Multiplanar, multiecho pulse sequences of the brain and surrounding structures were obtained without intravenous contrast. Angiographic images of the head were obtained using MRA technique without contrast.  Comparison:   CT of the brain performed earlier on the same day at 18 37.  MRI HEAD WITHOUT AND WITH CONTRAST  Findings:  A few scattered foci of hyperintense T2 / FLAIR signal are seen within the periventricular white matter of the right frontal lobe, nonspecific.  Otherwise, no focal parenchymal signal abnormality is seen within the brain parenchyma.  Gray-white matter differentiation is well maintained throughout the brain.  There is no midline shift or mass lesion.  The midline structures are intact.  Corpus callosum is normal.  The craniocervical junction is within normal limits.  No diffusion weighted signal abnormality to suggest acute intracranial infarct is identified.  No acute intracranial hemorrhage is seen.  There is no extra-axial fluid collection.  The orbital soft tissues and optic chiasm are within normal limits.  Mastoid air cells are clear.  Visualized inner ear structures are within normal limits.  Paranasal sinuses are clear.  Calvarium demonstrates a normal  appearance with normal signal intensity.  IMPRESSION: Normal noncontrast MRI of the brain with no acute intracranial infarct identified.  MRA HEAD  Findings: The internal carotid arteries are of normal caliber with antegrade flow.  The A1 segments, anterior communicating artery, and anterior cerebral arteries are normal caliber with symmetric antegrade flow.  Minimal irregularity is seen within the right M1 segment without focal high-grade stenosis.  Otherwise, the middle cerebral arteries are normal caliber.  The vertebral arteries, vertebrobasilar junction, and basilar artery are of normal caliber and appearance.  No high-grade stenosis identified.  The posterior cerebral arteries are within normal limits.  The posterior communicating arteries are not well visualized.  No intracranial aneurysm is identified.  IMPRESSION: Normal noncontrast MRA of the brain without evidence of high-grade stenosis or intracranial aneurysm.   Original Report Authenticated  By: Rise Mu, M.D.   Mr Brain Wo Contrast  07/18/2013   *RADIOLOGY REPORT*  Clinical Data: Stroke  MRI HEAD WITHOUT CONTRAST MRA HEAD WITHOUT CONTRAST  Technique:  Multiplanar, multiecho pulse sequences of the brain and surrounding structures were obtained without intravenous contrast. Angiographic images of the head were obtained using MRA technique without contrast.  Comparison:   CT of the brain performed earlier on the same day at 18 37.  MRI HEAD WITHOUT AND WITH CONTRAST  Findings:  A few scattered foci of hyperintense T2 / FLAIR signal are seen within the periventricular white matter of the right frontal lobe, nonspecific.  Otherwise, no focal parenchymal signal abnormality is seen within the brain parenchyma.  Gray-white matter differentiation is well maintained throughout the brain.  There is no midline shift or mass lesion.  The midline structures are intact.  Corpus callosum is normal.  The craniocervical junction is within normal limits.   No diffusion weighted signal abnormality to suggest acute intracranial infarct is identified.  No acute intracranial hemorrhage is seen.  There is no extra-axial fluid collection.  The orbital soft tissues and optic chiasm are within normal limits.  Mastoid air cells are clear.  Visualized inner ear structures are within normal limits.  Paranasal sinuses are clear.  Calvarium demonstrates a normal appearance with normal signal intensity.  IMPRESSION: Normal noncontrast MRI of the brain with no acute intracranial infarct identified.  MRA HEAD  Findings: The internal carotid arteries are of normal caliber with antegrade flow.  The A1 segments, anterior communicating artery, and anterior cerebral arteries are normal caliber with symmetric antegrade flow.  Minimal irregularity is seen within the right M1 segment without focal high-grade stenosis.  Otherwise, the middle cerebral arteries are normal caliber.  The vertebral arteries, vertebrobasilar junction, and basilar artery are of normal caliber and appearance.  No high-grade stenosis identified.  The posterior cerebral arteries are within normal limits.  The posterior communicating arteries are not well visualized.  No intracranial aneurysm is identified.  IMPRESSION: Normal noncontrast MRA of the brain without evidence of high-grade stenosis or intracranial aneurysm.   Original Report Authenticated By: Rise Mu, M.D.    Scheduled Meds: . amphetamine-dextroamphetamine  20 mg Oral Daily  . aspirin EC  81 mg Oral Daily  . atorvastatin  40 mg Oral q1800  . escitalopram  40 mg Oral Daily  . gabapentin  100 mg Oral TID  . hydrochlorothiazide  25 mg Oral Daily  . irbesartan  300 mg Oral Daily  . ondansetron  8 mg Oral BID  . pantoprazole  40 mg Oral Daily   Continuous Infusions:   Principal Problem:   CVA (cerebral infarction) Active Problems:   Cancer of upper-inner quadrant of female breast   TIA (transient ischemic attack)    Time  spent: >25 minutes     Esperanza Sheets  [facility] Pager [phone]. If 7PM-7AM, please contact night-coverage at [email or web address], password [facility] 07/20/2013, 8:28 AM  LOS: 2 days

## 2013-07-20 NOTE — Progress Notes (Signed)
VASCULAR LAB PRELIMINARY  PRELIMINARY  PRELIMINARY  PRELIMINARY  Carotid duplex  completed.    Preliminary report:  Bilateral:  1-39% ICA stenosis.  Vertebral artery flow is antegrade.      Britley Gashi, RVT 07/20/2013, 10:07 AM

## 2013-07-20 NOTE — Progress Notes (Signed)
  Echocardiogram 2D Echocardiogram has been performed.  Kristi [name] 07/20/2013, 12:33 PM

## 2013-07-20 NOTE — Discharge Summary (Signed)
Physician Discharge Summary  Kristi [name]:[id number] DOB: 03-Jun-1956 DOA: 07/18/2013  PCP: Altamese Parmelee, MD  Admit date: 07/18/2013 Discharge date: 07/20/2013  Time spent: > 35 minutes  Recommendations for Outpatient Follow-up:   -outpatient PT -f/u with PCP in 1 week  Discharge Diagnoses:  Principal Problem:   CVA (cerebral infarction) Active Problems:   Cancer of upper-inner quadrant of female breast   TIA (transient ischemic attack)   Discharge Condition: stable   Diet recommendation: heart healthy   Filed Weights   07/19/13 0105  Weight: 63.05 kg (139 lb)    History of present illness:  57 y.o. female has a past medical history significant for breast cancer diagnosed early 2014 (invasive ductal carcinoma) status post left breast lumpectomy and axillary lymph node dissection positive for metastatic disease and undergoing chemotherapy currently presents to the emergency room with a chief complaint of difficulty finding words ? TIA  Hospital Course:   1. altered speech possible TIA; MRI/MRA unremarkable; vs medication side effect ? Benzo+adderall  -clinically close to her baseline;  -pend echo results, doppler < 39%; d/w neurology okay for d/cwiht outpatient PCP f/u  -outpatient PT upon d/c  2. Breast cancer diagnosed early 2014 (invasive ductal carcinoma) status post left breast lumpectomy and axillary lymph node dissection positive for metastatic disease and undergoing chemotherapy  -cont monitoring; f/u with hem/onc  3. HTN stable  -hypo K likely diuretic induced; replace d 4. Anemia likely chronic disease cancer/chemo induced;  -no s/s of acute bleeding; monitor     Procedures: MRI, CT head as above  Consultations:  Neurology   Discharge Exam: Filed Vitals:   07/20/13 1447  BP: 111/65  Pulse: 83  Temp: 98.2 F (36.8 C)  Resp: 16    General: alert  Cardiovascular: S1, S2 rrr Respiratory: CTA bl   Discharge Instructions  Discharge Orders    Future Appointments Provider Department Dept Phone   07/24/2013 12:30 PM Beverely Pace [facility] [place] [phone]   07/24/2013 1:15 PM Augustin Schooling, NP [facility] [phone]   07/24/2013 2:15 PM [facility] [place] [facility] [phone]   07/25/2013 1:45 PM [facility] Flush Nurse [place] [facility] [phone]   07/31/2013 12:45 PM Beverely Pace [facility] [place] [facility] [phone]   07/31/2013 1:15 PM Augustin Schooling, NP [place] [facility] [phone]   08/07/2013 11:45 AM Krista [facility] MEDICAL ONCOLOG[facility] [phone]   08/07/2013 12:15 PM Victorino December, MD [facility] MEDICAL ONCOLOGY [phone]   08/07/2013 2:15 PM [facility] [place] [facility] [phone]   08/08/2013 1:30 PM [facility] Inj Nurse [place] [facility] [phone]   08/14/2013 1:15 PM Delcie [facility] [place] [facility] [phone]   08/14/2013 1:45 PM Augustin Schooling, NP [place] [facility] [phone]   Future Orders Complete By Expires   Diet - low sodium heart healthy  As directed    Discharge instructions  As directed    Comments:     Follow up with primary care doctor in 1 week   Increase activity slowly  As directed        Medication List         ALPRAZolam 1 MG tablet  Commonly known as:  XANAX  Take 2 mg by mouth at bedtime as needed for sleep.  amphetamine-dextroamphetamine 10 MG tablet  Commonly known as:  ADDERALL  Take 20 mg by mouth daily.     aspirin 81 MG EC tablet  Take 1 tablet (81 mg total) by mouth daily.     atorvastatin 40 MG tablet  Commonly known as:  LIPITOR  Take 1 tablet (40 mg total) by mouth daily at 6 PM.     dexamethasone 4 MG tablet  Commonly known as:  DECADRON   Take 2 tablets (8 mg total) by mouth 2 (two) times daily with a meal. Take two times a day starting the day after chemotherapy for 3 days.     docusate sodium 100 MG capsule  Commonly known as:  COLACE  Take 100 mg by mouth 2 (two) times daily as needed for constipation.     escitalopram 20 MG tablet  Commonly known as:  LEXAPRO  Take 40 mg by mouth daily.     esomeprazole 40 MG capsule  Commonly known as:  NEXIUM  Take 40 mg by mouth daily as needed. For reflux     FISH OIL PO  Take 1 capsule by mouth daily.     gabapentin 100 MG capsule  Commonly known as:  NEURONTIN  Take 1 capsule (100 mg total) by mouth 3 (three) times daily.     lidocaine-prilocaine cream  Commonly known as:  EMLA  Apply topically as needed.     LORazepam 0.5 MG tablet  Commonly known as:  ATIVAN  Take 1 tablet (0.5 mg total) by mouth every 6 (six) hours as needed (Nausea or vomiting).     multivitamin tablet  Take 1 tablet by mouth daily.     ondansetron 8 MG tablet  Commonly known as:  ZOFRAN  Take 1 tablet (8 mg total) by mouth 2 (two) times daily. Take two times a day starting the day after chemo for 3 days. Then take two times a day as needed for nausea or vomiting.     OVER THE COUNTER MEDICATION  Take by mouth as needed.     potassium chloride SA 20 MEQ tablet  Commonly known as:  K-DUR,KLOR-CON  Take 1 tablet (20 mEq total) by mouth 3 (three) times daily. For 5 days then as directed.     prochlorperazine 10 MG tablet  Commonly known as:  COMPAZINE  Take 1 tablet (10 mg total) by mouth every 6 (six) hours as needed (Nausea or vomiting).     prochlorperazine 25 MG suppository  Commonly known as:  COMPAZINE  Place 1 suppository (25 mg total) rectally every 12 (twelve) hours as needed for nausea.     SUPER B COMPLEX PO  Take 1 tablet by mouth daily.     triamcinolone ointment 0.5 %  Commonly known as:  KENALOG  Apply topically 2 (two) times daily.     valsartan-hydrochlorothiazide  320-25 MG per tablet  Commonly known as:  DIOVAN-HCT  Take 0.5 tablets by mouth daily.       No Known Allergies     Follow-up Information   Follow up with Altamese [place], MD In 1 week.   Specialty:  Family Medicine   Contact information:   [address] [place] [address] [phone]        The results of significant diagnostics from this hospitalization (including imaging, microbiology, ancillary and laboratory) are listed below for reference.    Significant Diagnostic Studies: Ct Head Wo Contrast  07/18/2013   CLINICAL DATA:  57 year old female with  confusion and slurred speech.  EXAM: CT HEAD WITHOUT CONTRAST  TECHNIQUE: Contiguous axial images were obtained from the base of the skull through the vertex without intravenous contrast.  COMPARISON:  None.  FINDINGS: Visualized paranasal sinuses and mastoids are clear. No acute osseous abnormality identified. Visualized orbits and scalp soft tissues are within normal limits.  Cerebral volume is normal. No midline shift, ventriculomegaly, mass effect, evidence of mass lesion, intracranial hemorrhage or evidence of cortically based acute infarction. Gray-white matter differentiation is within normal limits throughout the brain. No suspicious intracranial vascular hyperdensity.  IMPRESSION: Normal noncontrast CT appearance of the brain.   Electronically Signed   By: Augusto Gamble M.D.   On: 07/18/2013 18:40   Mr Maxine Glenn Head Wo Contrast  07/18/2013   *RADIOLOGY REPORT*  Clinical Data: Stroke  MRI HEAD WITHOUT CONTRAST MRA HEAD WITHOUT CONTRAST  Technique:  Multiplanar, multiecho pulse sequences of the brain and surrounding structures were obtained without intravenous contrast. Angiographic images of the head were obtained using MRA technique without contrast.  Comparison:   CT of the brain performed earlier on the same day at 18 37.  MRI HEAD WITHOUT AND WITH CONTRAST  Findings:  A few scattered foci of hyperintense T2 / FLAIR  signal are seen within the periventricular white matter of the right frontal lobe, nonspecific.  Otherwise, no focal parenchymal signal abnormality is seen within the brain parenchyma.  Gray-white matter differentiation is well maintained throughout the brain.  There is no midline shift or mass lesion.  The midline structures are intact.  Corpus callosum is normal.  The craniocervical junction is within normal limits.  No diffusion weighted signal abnormality to suggest acute intracranial infarct is identified.  No acute intracranial hemorrhage is seen.  There is no extra-axial fluid collection.  The orbital soft tissues and optic chiasm are within normal limits.  Mastoid air cells are clear.  Visualized inner ear structures are within normal limits.  Paranasal sinuses are clear.  Calvarium demonstrates a normal appearance with normal signal intensity.  IMPRESSION: Normal noncontrast MRI of the brain with no acute intracranial infarct identified.  MRA HEAD  Findings: The internal carotid arteries are of normal caliber with antegrade flow.  The A1 segments, anterior communicating artery, and anterior cerebral arteries are normal caliber with symmetric antegrade flow.  Minimal irregularity is seen within the right M1 segment without focal high-grade stenosis.  Otherwise, the middle cerebral arteries are normal caliber.  The vertebral arteries, vertebrobasilar junction, and basilar artery are of normal caliber and appearance.  No high-grade stenosis identified.  The posterior cerebral arteries are within normal limits.  The posterior communicating arteries are not well visualized.  No intracranial aneurysm is identified.  IMPRESSION: Normal noncontrast MRA of the brain without evidence of high-grade stenosis or intracranial aneurysm.   Original Report Authenticated By: Rise Mu, M.D.   Mr Brain Wo Contrast  07/18/2013   *RADIOLOGY REPORT*  Clinical Data: Stroke  MRI HEAD WITHOUT CONTRAST MRA HEAD WITHOUT  CONTRAST  Technique:  Multiplanar, multiecho pulse sequences of the brain and surrounding structures were obtained without intravenous contrast. Angiographic images of the head were obtained using MRA technique without contrast.  Comparison:   CT of the brain performed earlier on the same day at 18 37.  MRI HEAD WITHOUT AND WITH CONTRAST  Findings:  A few scattered foci of hyperintense T2 / FLAIR signal are seen within the periventricular white matter of the right frontal lobe, nonspecific.  Otherwise, no focal parenchymal signal  abnormality is seen within the brain parenchyma.  Gray-white matter differentiation is well maintained throughout the brain.  There is no midline shift or mass lesion.  The midline structures are intact.  Corpus callosum is normal.  The craniocervical junction is within normal limits.  No diffusion weighted signal abnormality to suggest acute intracranial infarct is identified.  No acute intracranial hemorrhage is seen.  There is no extra-axial fluid collection.  The orbital soft tissues and optic chiasm are within normal limits.  Mastoid air cells are clear.  Visualized inner ear structures are within normal limits.  Paranasal sinuses are clear.  Calvarium demonstrates a normal appearance with normal signal intensity.  IMPRESSION: Normal noncontrast MRI of the brain with no acute intracranial infarct identified.  MRA HEAD  Findings: The internal carotid arteries are of normal caliber with antegrade flow.  The A1 segments, anterior communicating artery, and anterior cerebral arteries are normal caliber with symmetric antegrade flow.  Minimal irregularity is seen within the right M1 segment without focal high-grade stenosis.  Otherwise, the middle cerebral arteries are normal caliber.  The vertebral arteries, vertebrobasilar junction, and basilar artery are of normal caliber and appearance.  No high-grade stenosis identified.  The posterior cerebral arteries are within normal limits.  The  posterior communicating arteries are not well visualized.  No intracranial aneurysm is identified.  IMPRESSION: Normal noncontrast MRA of the brain without evidence of high-grade stenosis or intracranial aneurysm.   Original Report Authenticated By: Rise Mu, M.D.    Microbiology: Recent Results (from the past 240 hour(s))  MRSA PCR SCREENING     Status: None   Collection Time    07/19/13  2:11 AM      Result Value Range Status   MRSA by PCR NEGATIVE  NEGATIVE Final   Comment:            The GeneXpert MRSA Assay (FDA     approved for NASAL specimens     only), is one component of a     comprehensive MRSA colonization     surveillance program. It is not     intended to diagnose MRSA     infection nor to guide or     monitor treatment for     MRSA infections.     Labs: Basic Metabolic Panel:  Recent Labs Lab 07/17/13 1310 07/18/13 1815 07/19/13 0717 07/20/13 0557  NA 139 138 139 139  K 3.8 3.5 3.4* 3.7  CL  --  102 103 106  CO2 30* 29 27 26   GLUCOSE 115 107* 96 95  BUN 18.8 13 11 8   CREATININE 0.8 0.64 0.62 0.58  CALCIUM 9.5 9.9 9.2 9.1   Liver Function Tests:  Recent Labs Lab 07/18/13 1815 07/19/13 0717  AST 26 20  ALT 36* 28  ALKPHOS 41 32*  BILITOT 0.3 0.2*  PROT 6.3 5.1*  ALBUMIN 3.7 3.1*   No results found for this basename: LIPASE, AMYLASE,  in the last 168 hours No results found for this basename: AMMONIA,  in the last 168 hours CBC:  Recent Labs Lab 07/17/13 1310 07/18/13 1815  WBC 2.7* 2.5*  NEUTROABS 1.3* 1.2*  HGB 10.0* 9.5*  HCT 30.3* 28.6*  MCV 97.6 94.7  PLT 71* 71*   Cardiac Enzymes:  Recent Labs Lab 07/19/13 0717  TROPONINI <0.30   BNP: BNP (last 3 results) No results found for this basename: PROBNP,  in the last 8760 hours CBG:  Recent Labs Lab 07/19/13 1226 07/19/13 1656 07/19/13  2259 07/20/13 0653 07/20/13 1133  GLUCAP 115* 105* 123* 103* 153*       Signed:  Deionte Spivack N  [facility] 07/20/2013, 3:02 PM

## 2013-07-20 NOTE — Progress Notes (Signed)
Patient was having difficulty sleeping and requested xanax.  MD notified.  Order received.  Will continue to monitor.    Kristi [name] Route 07/20/2013

## 2013-07-24 ENCOUNTER — Ambulatory Visit (HOSPITAL_BASED_OUTPATIENT_CLINIC_OR_DEPARTMENT_OTHER): Payer: BC Managed Care – PPO | Admitting: Adult Health

## 2013-07-24 ENCOUNTER — Encounter: Payer: Self-pay | Admitting: Adult Health

## 2013-07-24 ENCOUNTER — Ambulatory Visit (HOSPITAL_BASED_OUTPATIENT_CLINIC_OR_DEPARTMENT_OTHER): Payer: BC Managed Care – PPO

## 2013-07-24 ENCOUNTER — Other Ambulatory Visit (HOSPITAL_BASED_OUTPATIENT_CLINIC_OR_DEPARTMENT_OTHER): Payer: BC Managed Care – PPO | Admitting: Lab

## 2013-07-24 ENCOUNTER — Telehealth: Payer: Self-pay | Admitting: *Deleted

## 2013-07-24 ENCOUNTER — Ambulatory Visit: Payer: BC Managed Care – PPO

## 2013-07-24 VITALS — BP 136/81 | HR 96 | Temp 98.7°F | Resp 20 | Ht 62.0 in | Wt 140.5 lb

## 2013-07-24 DIAGNOSIS — F419 Anxiety disorder, unspecified: Secondary | ICD-10-CM

## 2013-07-24 DIAGNOSIS — F411 Generalized anxiety disorder: Secondary | ICD-10-CM

## 2013-07-24 DIAGNOSIS — C50219 Malignant neoplasm of upper-inner quadrant of unspecified female breast: Secondary | ICD-10-CM

## 2013-07-24 DIAGNOSIS — C50912 Malignant neoplasm of unspecified site of left female breast: Secondary | ICD-10-CM

## 2013-07-24 DIAGNOSIS — C50212 Malignant neoplasm of upper-inner quadrant of left female breast: Secondary | ICD-10-CM

## 2013-07-24 DIAGNOSIS — D702 Other drug-induced agranulocytosis: Secondary | ICD-10-CM

## 2013-07-24 DIAGNOSIS — Z5189 Encounter for other specified aftercare: Secondary | ICD-10-CM

## 2013-07-24 LAB — COMPREHENSIVE METABOLIC PANEL (CC13)
ALT: 26 U/L (ref 0–55)
Albumin: 3.6 g/dL (ref 3.5–5.0)
CO2: 30 mEq/L — ABNORMAL HIGH (ref 22–29)
Chloride: 105 mEq/L (ref 98–109)
Potassium: 3.3 mEq/L — ABNORMAL LOW (ref 3.5–5.1)
Sodium: 145 mEq/L (ref 136–145)
Total Bilirubin: 0.4 mg/dL (ref 0.20–1.20)
Total Protein: 6.6 g/dL (ref 6.4–8.3)

## 2013-07-24 LAB — CBC WITH DIFFERENTIAL/PLATELET
BASO%: 1.2 % (ref 0.0–2.0)
LYMPH%: 61.3 % — ABNORMAL HIGH (ref 14.0–49.7)
MCHC: 32.8 g/dL (ref 31.5–36.0)
MONO#: 0.2 10*3/uL (ref 0.1–0.9)
NEUT#: 0.3 10*3/uL — CL (ref 1.5–6.5)
RBC: 3.18 10*6/uL — ABNORMAL LOW (ref 3.70–5.45)
RDW: 20.5 % — ABNORMAL HIGH (ref 11.2–14.5)
WBC: 1.3 10*3/uL — ABNORMAL LOW (ref 3.9–10.3)
lymph#: 0.8 10*3/uL — ABNORMAL LOW (ref 0.9–3.3)

## 2013-07-24 MED ORDER — PEGFILGRASTIM INJECTION 6 MG/0.6ML
6.0000 mg | Freq: Once | SUBCUTANEOUS | Status: AC
  Administered 2013-07-24: 6 mg via SUBCUTANEOUS
  Filled 2013-07-24: qty 0.6

## 2013-07-24 MED ORDER — ESCITALOPRAM OXALATE 20 MG PO TABS: 20.0000 mg | ORAL_TABLET | Freq: Every day | ORAL | Status: DC

## 2013-07-24 MED ORDER — CIPROFLOXACIN HCL 500 MG PO TABS: 500.0000 mg | ORAL_TABLET | Freq: Two times a day (BID) | ORAL | Status: DC

## 2013-07-24 NOTE — Telephone Encounter (Signed)
appts made and printed. Pt is aware that i sw Suzane and she stated that they will give the pt a call. She stated that A[facility] Lo[facility] would be the one calling for an appt...td

## 2013-07-24 NOTE — Progress Notes (Addendum)
OFFICE PROGRESS NOTE  CC**  [name],[name], MD [address], [address] [place] [address]  DIAGNOSIS: 57 year old female with stage IIA ER weakly positive, PR negative HER-2/neu negative, invasive ductal carcinoma of the left breast.   PRIOR THERAPY: 1. Earlier this year it on routine screening mammography the patient was noted to have a abnormality within the 12:00 position of the left breast. Patient returned for additional imaging with 3-[name], tomosynthesis. Within the left breast there were 2 masses somewhat lobulated with irregular margins in the upper inner aspect of the left breast. These were separated by approximately a centimeter. On ultrasound the lesion the 11:00 position measured 1.0 x 0.7 cm. The 12:00 lesion measured 1.6 x 0.9 cm. In addition within the axillary region there was a 2.1 x 1.7 suspicious appearing lymph node.. The patient proceeded to undergo ultrasound-guided biopsy of the 2 areas within the left breast as well as the suspicious left axillary lymph node. Biopsies from both breast areas revealed invasive ductal carcinoma. In addition the lymph node from the left axilla revealed ductal carcinoma. The lesions appeared to be high-grade. There was no HER-2/neu amplification and hormone staining was minimal consistent with essentially consistent with triple negative breast cancer.  Patient's case was discussed at the multidisciplinary breast conference her pathology and radiology were reviewed.  2. Patient underwent lumpectomy of the left breast with Dr. Donell Beers on 02/04/13, a 1.6cm tumor was removed along with an axillary lymph node dissection that found 1/19 nodes positive for metastatic disease.    3. Patient was then started on adjuvant chemotherapy on 03/05/13 with Adriamycin/Cytoxan. She began Taxol Carbo on 05/07/13, this was discontinued after 9 cycles due to rash.  She will start Gemzar carbo on 9/25 for two cycles.  CURRENT THERAPY:  Gemzar Carbo  INTERVAL  HISTORY: Kristi [name] 57 y.o. female returns for evaluation prior to Gemzar Carbo.  She is neutropenic and thrombocytopenic.  She was recently hospitalized at [facility] for [facility].  She was started on Lipitor and a baby aspirin daily.  She is tolerating these medications well.  She denies fevers, chills, nausea, vomiting, constipation, diarrhea.  She is taking neurontin 300mg  daily for her numbness and it is improved.  She does endorse occasional bloody nasal drainage when blowing her nose.  Her husband is with her today.  He is very concerned about his wife from a psychological perspective.  His opinion is that she is having panic attacks one to two times per week and needs more psychological help than what she is getting with her current psychiatrist.  Otherwise, she is w/o questions/concerns.   MEDICAL HISTORY: Past Medical History  Diagnosis Date  . Anxiety   . GERD (gastroesophageal reflux disease)   . Hypertension   . MRSA (methicillin resistant Staphylococcus aureus) 2009  . Depression   . Anemia     Iron deficinecy anemia  . Breast cancer     ALLERGIES:  has No Known Allergies.  MEDICATIONS:  Current Outpatient Prescriptions  Medication Sig Dispense Refill  . ALPRAZolam (XANAX) 1 MG tablet Take 2 mg by mouth at bedtime as needed for sleep.       Marland Kitchen amphetamine-dextroamphetamine (ADDERALL) 10 MG tablet Take 20 mg by mouth daily.       Marland Kitchen aspirin EC 81 MG EC tablet Take 1 tablet (81 mg total) by mouth daily.  30 tablet  0  . atorvastatin (LIPITOR) 40 MG tablet Take 1 tablet (40 mg total) by  mouth daily at 6 PM.  30 tablet  1  . B Complex-C (SUPER B COMPLEX PO) Take 1 tablet by mouth daily.      . ciprofloxacin (CIPRO) 500 MG tablet Take 1 tablet (500 mg total) by mouth 2 (two) times daily.  14 tablet  0  . docusate sodium (COLACE) 100 MG capsule Take 100 mg by mouth 2 (two) times daily as needed for constipation.      Marland Kitchen escitalopram (LEXAPRO) 20 MG tablet Take 1 tablet (20 mg total) by  mouth daily.  60 tablet  6  . esomeprazole (NEXIUM) 40 MG capsule Take 40 mg by mouth daily as needed. For reflux      . gabapentin (NEURONTIN) 100 MG capsule Take 1 capsule (100 mg total) by mouth 3 (three) times daily.  90 capsule  2  . lidocaine-prilocaine (EMLA) cream Apply topically as needed.  30 g  7  . Multiple Vitamin (MULTIVITAMIN) tablet Take 1 tablet by mouth daily.      . Omega-3 Fatty Acids (FISH OIL PO) Take 1 capsule by mouth daily.      Marland Kitchen OVER THE COUNTER MEDICATION Take by mouth as needed.      . potassium chloride SA (K-DUR,KLOR-CON) 20 MEQ tablet Take 1 tablet (20 mEq total) by mouth 3 (three) times daily. For 5 days then as directed.  60 tablet  0  . triamcinolone ointment (KENALOG) 0.5 % Apply topically 2 (two) times daily.  30 g  0  . valsartan-hydrochlorothiazide (DIOVAN-HCT) 320-25 MG per tablet Take 0.5 tablets by mouth daily.       . [DISCONTINUED] prochlorperazine (COMPAZINE) 10 MG tablet Take 1 tablet (10 mg total) by mouth every 6 (six) hours as needed (Nausea or vomiting).  30 tablet  1  . [DISCONTINUED] prochlorperazine (COMPAZINE) 25 MG suppository Place 1 suppository (25 mg total) rectally every 12 (twelve) hours as needed for nausea.  12 suppository  3   No current facility-administered medications for this visit.    SURGICAL HISTORY:  Past Surgical History  Procedure Laterality Date  . Anal sphincterotomy  04/2011  . Hemorrhoid surgery  04/2011    ligation  . Breast lumpectomy with needle localization Left 02/04/2013    Procedure: LEFT BREAST LUMPECTOMY WITH NEEDLE LOCALIZATION;  Surgeon: Almond Lint, MD;  Location: [facility];  Service: General;  Laterality: Left;  . Axillary lymph node dissection Left 02/04/2013    Procedure: LEFT AXILLARY LYMPH NODE DISSECTION;  Surgeon: Almond Lint, MD;  Location: [facility] OR;  Service: General;  Laterality: Left;  End: 1512  . Portacath placement Right 02/04/2013    Procedure: INSERTION PORT-A-CATH;  Surgeon: Almond Lint, MD;   Location: [facility] OR;  Service: General;  Laterality: Right;  Start Time: 1610.  Marland Kitchen Appendectomy  1980  . Breast surgery      Lumpectomy in april 2014    REVIEW OF SYSTEMS:   A 10 point review of systems was conducted and is otherwise negative except for what is noted above.    PHYSICAL EXAMINATION: Blood pressure 136/81, pulse 96, temperature 98.7 F (37.1 C), temperature source Oral, resp. rate 20, height 5\' 2"  (1.575 m), weight 140 lb 8 oz (63.73 kg). Body mass index is 25.69 kg/(m^2). General: Patient is a well appearing female in no acute distress HEENT: PERRLA, sclerae anicteric no conjunctival pallor, MMM Neck: supple, no palpable adenopathy Lungs: clear to auscultation bilaterally, no wheezes, rhonchi, or rales Cardiovascular: regular rate rhythm, S1, S2, no murmurs, rubs or  gallops Abdomen: Soft, non-tender, non-distended, normoactive bowel sounds, no HSM Extremities: warm and well perfused, no clubbing, cyanosis, or edema,  Skin: erythematous lesion on right wrist and right knee, drying, and now appearing as hyperpigmented skin.  Finger Nails are changing color and darkening, beginning to separate from the nail bed. Neuro: Non-focal Breasts: left breast lumpectomy site no nodularity, healing well, scar tissue underneath axillary incision, no erythema, swelling, drainage, right breast no masses or nodularity.  ECOG PERFORMANCE STATUS: 1 - Symptomatic but completely ambulatory  LABORATORY DATA: Lab Results  Component Value Date   WBC 1.3* 07/24/2013   HGB 10.2* 07/24/2013   HCT 31.2* 07/24/2013   MCV 97.9 07/24/2013   PLT 66* 07/24/2013      Chemistry      Component Value Date/Time   NA 145 07/24/2013 1235   NA 139 07/20/2013 0557   K 3.3* 07/24/2013 1235   K 3.7 07/20/2013 0557   CL 106 07/20/2013 0557   CL 100 04/23/2013 1447   CO2 30* 07/24/2013 1235   CO2 26 07/20/2013 0557   BUN 11.6 07/24/2013 1235   BUN 8 07/20/2013 0557   CREATININE 0.8 07/24/2013 1235   CREATININE 0.58  07/20/2013 0557      Component Value Date/Time   CALCIUM 10.0 07/24/2013 1235   CALCIUM 9.1 07/20/2013 0557   ALKPHOS 49 07/24/2013 1235   ALKPHOS 32* 07/19/2013 0717   AST 20 07/24/2013 1235   AST 20 07/19/2013 0717   ALT 26 07/24/2013 1235   ALT 28 07/19/2013 0717   BILITOT 0.40 07/24/2013 1235   BILITOT 0.2* 07/19/2013 0717       RADIOGRAPHIC STUDIES:  No results found.  ASSESSMENT: 57 year old female with new diagnosis of   #1 multifocal invasive ductal carcinoma grade 3 ER weakly positive at 3% PR negative HER-2/neu negative with Ki-67 of 90% tumor is very aggressive. She did have a palpable lymph node as well in the left axilla. Patient was seen in the multidisciplinary clinic for discussion of treatment options.   #2 patient underwent a lumpectomy with axillary lymph node dissection with Dr. Luz Brazen on 02/04/13. A 1.6 cm tumor was removed with 1/19 lymph nodes positive for disease.    #3 She was recommended adjuvant chemotherapy consisting of Adriamycin Cytoxan given dose dense for total of 4 cycles followed by Taxol carboplatinum weekly for 12 weeks. Rationale for this aggressive treatment was discussed with the patient since she is essentially has a triple-negative disease.  She received 4 cycles of Adriamycin/Cytoxan, and 9 cycles of Taxol/Carbo.  Due to rash likely from Taxol, patient will receive 2 cycles of Gemzar carbo.    #4 certainly patient will need post lumpectomy radiation therapy she was seen by radiation oncology. This will begin after patient has completed her chemotherapy.   #5 we discussed role of genetics, and she had an appointment with Maylon Cos on 03/06/13 the results were negative.       PLAN:  1. Doing well. She is thrombocytopenic and neutropenic today.  She will not receive chemotherapy.  She will receive Neulasta today.    #2  We will refer her to [facility] for counseling.  She was very tearful during discussion about this.    #3 Patient will return  next week for labs and eval for treatment.    All questions were answered. The patient knows to call the clinic with any problems, questions or concerns. We can certainly see the patient much sooner if necessary.  I spent 25 minutes counseling the patient face to face. The total time spent in the appointment was 30 minutes.  Cherie Ouch Lyn Hollingshead, NP Medical Oncology [facility] Phone: [phone] 07/25/2013, 8:30 AM    ATTENDING'S ATTESTATION:  I personally reviewed patient's chart, examined patient myself, formulated the treatment plan as followed.    Patient seems to be quite anxious and depressed. We discussed extensively on how to her and her overall psychological well-being. I referred her to Dr. Nigel Berthold for counseling. She is already seeing a psychiatrist and is getting her medications from them.   She will get Neulasta today. She will return in 2 weeks' time for first cycle of Gemzar and carboplatinum  Drue Second, MD Medical/Oncology [facility] [phone] (beeper) [phone] (Office)  07/27/2013, 11:29 PM

## 2013-07-24 NOTE — Patient Instructions (Signed)
  Patient Neutropenia Instruction Sheet  Diagnosis: Breast Cancer      Treating Physician: Drue Second, MD  Treatment: 1. Type of chemotherapy: Taxol/Carbo  2. Date of last treatment: 07/10/13  Last Blood Counts: Lab Results  Component Value Date   WBC 1.3* 07/24/2013   HGB 10.2* 07/24/2013   HCT 31.2* 07/24/2013   MCV 97.9 07/24/2013   PLT 66* 07/24/2013   ANC 300     Prophylactic Antibiotics: Cipro 500 mg by mouth twice a day Instructions: 1. Monitor temperature and call if fever  greater than 100.5, chills, shaking chills (rigors) 2. Call Physician on-call at [phone] 3. Give him/her symptoms and list of medications that you are taking and your last blood count.

## 2013-07-25 ENCOUNTER — Telehealth: Payer: Self-pay | Admitting: Oncology

## 2013-07-25 ENCOUNTER — Ambulatory Visit: Payer: BC Managed Care – PPO

## 2013-07-26 ENCOUNTER — Ambulatory Visit: Payer: BC Managed Care – PPO

## 2013-07-28 ENCOUNTER — Ambulatory Visit: Payer: BC Managed Care – PPO

## 2013-07-29 ENCOUNTER — Ambulatory Visit: Payer: BC Managed Care – PPO

## 2013-07-31 ENCOUNTER — Ambulatory Visit (HOSPITAL_BASED_OUTPATIENT_CLINIC_OR_DEPARTMENT_OTHER): Payer: BC Managed Care – PPO | Admitting: Adult Health

## 2013-07-31 ENCOUNTER — Telehealth: Payer: Self-pay | Admitting: Oncology

## 2013-07-31 ENCOUNTER — Telehealth: Payer: Self-pay | Admitting: *Deleted

## 2013-07-31 ENCOUNTER — Other Ambulatory Visit (HOSPITAL_BASED_OUTPATIENT_CLINIC_OR_DEPARTMENT_OTHER): Payer: BC Managed Care – PPO | Admitting: Lab

## 2013-07-31 ENCOUNTER — Telehealth: Payer: Self-pay | Admitting: Medical Oncology

## 2013-07-31 ENCOUNTER — Encounter: Payer: Self-pay | Admitting: Adult Health

## 2013-07-31 VITALS — BP 122/77 | HR 101 | Temp 98.0°F | Resp 18 | Ht 62.0 in | Wt 143.8 lb

## 2013-07-31 DIAGNOSIS — C50212 Malignant neoplasm of upper-inner quadrant of left female breast: Secondary | ICD-10-CM

## 2013-07-31 DIAGNOSIS — C50912 Malignant neoplasm of unspecified site of left female breast: Secondary | ICD-10-CM

## 2013-07-31 DIAGNOSIS — D696 Thrombocytopenia, unspecified: Secondary | ICD-10-CM

## 2013-07-31 DIAGNOSIS — C50219 Malignant neoplasm of upper-inner quadrant of unspecified female breast: Secondary | ICD-10-CM

## 2013-07-31 DIAGNOSIS — E876 Hypokalemia: Secondary | ICD-10-CM

## 2013-07-31 DIAGNOSIS — Z170 Estrogen receptor positive status [ER+]: Secondary | ICD-10-CM

## 2013-07-31 DIAGNOSIS — C50919 Malignant neoplasm of unspecified site of unspecified female breast: Secondary | ICD-10-CM

## 2013-07-31 LAB — CBC WITH DIFFERENTIAL/PLATELET
BASO%: 0.6 % (ref 0.0–2.0)
Eosinophils Absolute: 0 10*3/uL (ref 0.0–0.5)
HGB: 9.2 g/dL — ABNORMAL LOW (ref 11.6–15.9)
LYMPH%: 23.8 % (ref 14.0–49.7)
MCV: 97.6 fL (ref 79.5–101.0)
MONO%: 14.4 % — ABNORMAL HIGH (ref 0.0–14.0)
NEUT#: 3.2 10*3/uL (ref 1.5–6.5)
Platelets: 56 10*3/uL — ABNORMAL LOW (ref 145–400)
RBC: 2.89 10*6/uL — ABNORMAL LOW (ref 3.70–5.45)
RDW: 19.7 % — ABNORMAL HIGH (ref 11.2–14.5)
WBC: 5.3 10*3/uL (ref 3.9–10.3)
lymph#: 1.3 10*3/uL (ref 0.9–3.3)

## 2013-07-31 LAB — COMPREHENSIVE METABOLIC PANEL (CC13)
ALT: 20 U/L (ref 0–55)
AST: 28 U/L (ref 5–34)
Albumin: 3.4 g/dL — ABNORMAL LOW (ref 3.5–5.0)
Alkaline Phosphatase: 67 U/L (ref 40–150)
BUN: 8.2 mg/dL (ref 7.0–26.0)
Glucose: 119 mg/dl (ref 70–140)
Potassium: 3 mEq/L — CL (ref 3.5–5.1)
Sodium: 144 mEq/L (ref 136–145)
Total Bilirubin: 0.36 mg/dL (ref 0.20–1.20)
Total Protein: 6.2 g/dL — ABNORMAL LOW (ref 6.4–8.3)

## 2013-07-31 MED ORDER — POTASSIUM CHLORIDE CRYS ER 20 MEQ PO TBCR: 20.0000 meq | EXTENDED_RELEASE_TABLET | Freq: Three times a day (TID) | ORAL | Status: DC

## 2013-07-31 MED ORDER — LIDOCAINE-PRILOCAINE 2.5-2.5 % EX CREA: TOPICAL_CREAM | CUTANEOUS | Status: DC | PRN

## 2013-07-31 NOTE — Telephone Encounter (Signed)
Per NP, informed patient of potassium @ 3 and patient to take Kdur 20 meq by mouth two times a day for 7 days. Patient informed prescription will be sent to her pharmacy. Pt gave verbal understanding, denies further questions.

## 2013-07-31 NOTE — Progress Notes (Addendum)
OFFICE PROGRESS NOTE  CC**  Kristi [name],[name], MD [address] [place] [address]  DIAGNOSIS: 57 year old female with stage IIA ER weakly positive, PR negative HER-2/neu negative, invasive ductal carcinoma of the left breast.   PRIOR THERAPY: 1. Earlier this year it on routine screening mammography the patient was noted to have a abnormality within the 12:00 position of the left breast. Patient returned for additional imaging with 3-[name], tomosynthesis. Within the left breast there were 2 masses somewhat lobulated with irregular margins in the upper inner aspect of the left breast. These were separated by approximately a centimeter. On ultrasound the lesion the 11:00 position measured 1.0 x 0.7 cm. The 12:00 lesion measured 1.6 x 0.9 cm. In addition within the axillary region there was a 2.1 x 1.7 suspicious appearing lymph node.. The patient proceeded to undergo ultrasound-guided biopsy of the 2 areas within the left breast as well as the suspicious left axillary lymph node. Biopsies from both breast areas revealed invasive ductal carcinoma. In addition the lymph node from the left axilla revealed ductal carcinoma. The lesions appeared to be high-grade. There was no HER-2/neu amplification and hormone staining was minimal consistent with essentially consistent with triple negative breast cancer.  Patient's case was discussed at the multidisciplinary breast conference her pathology and radiology were reviewed.  2. Patient underwent lumpectomy of the left breast with Dr. Donell Beers on 02/04/13, a 1.6cm tumor was removed along with an axillary lymph node dissection that found 1/19 nodes positive for metastatic disease.    3. Patient was then started on adjuvant chemotherapy on 03/05/13 with Adriamycin/Cytoxan. She began Taxol Carbo on 05/07/13, this was discontinued after 9 cycles due to rash.  She will start Gemzar carbo on 9/25 for two cycles.  CURRENT THERAPY:  Gemzar Carbo  INTERVAL  HISTORY: Kristi [name] 57 y.o. female returns for evaluation prior to Gemzar Carbo.  She is doing well today.  She is doing better from an emotional standpoint.  She has some mild tightness in her left foot, and numbness occasionally when she bends her right arm in her elbow, however, she denies fevers, chills, nausea, vomiting, constipation, diarrhea, numbness, or any further concerns. Otherwise, she is w/o questions concerns.    MEDICAL HISTORY: Past Medical History  Diagnosis Date  . Anxiety   . GERD (gastroesophageal reflux disease)   . Hypertension   . MRSA (methicillin resistant Staphylococcus aureus) 2009  . Depression   . Anemia     Iron deficinecy anemia  . Breast cancer     ALLERGIES:  has No Known Allergies.  MEDICATIONS:  Current Outpatient Prescriptions  Medication Sig Dispense Refill  . ALPRAZolam (XANAX) 1 MG tablet Take 2 mg by mouth at bedtime as needed for sleep.       Marland Kitchen amphetamine-dextroamphetamine (ADDERALL) 10 MG tablet Take 20 mg by mouth daily.       Marland Kitchen aspirin EC 81 MG EC tablet Take 1 tablet (81 mg total) by mouth daily.  30 tablet  0  . atorvastatin (LIPITOR) 40 MG tablet Take 1 tablet (40 mg total) by mouth daily at 6 PM.  30 tablet  1  . B Complex-C (SUPER B COMPLEX PO) Take 1 tablet by mouth daily.      . ciprofloxacin (CIPRO) 500 MG tablet Take 1 tablet (500 mg total) by mouth 2 (two) times daily.  14 tablet  0  . docusate sodium (COLACE) 100 MG capsule Take 100 mg by mouth 2 (  two) times daily as needed for constipation.      Marland Kitchen escitalopram (LEXAPRO) 20 MG tablet Take 1 tablet (20 mg total) by mouth daily.  60 tablet  6  . esomeprazole (NEXIUM) 40 MG capsule Take 40 mg by mouth daily as needed. For reflux      . gabapentin (NEURONTIN) 100 MG capsule Take 1 capsule (100 mg total) by mouth 3 (three) times daily.  90 capsule  2  . lidocaine-prilocaine (EMLA) cream Apply topically as needed.  30 g  7  . Multiple Vitamin (MULTIVITAMIN) tablet Take 1 tablet by  mouth daily.      . Omega-3 Fatty Acids (FISH OIL PO) Take 1 capsule by mouth daily.      Marland Kitchen OVER THE COUNTER MEDICATION Take by mouth as needed.      . triamcinolone ointment (KENALOG) 0.5 % Apply topically 2 (two) times daily.  30 g  0  . valsartan-hydrochlorothiazide (DIOVAN-HCT) 320-25 MG per tablet Take 0.5 tablets by mouth daily.       . potassium chloride SA (K-DUR,KLOR-CON) 20 MEQ tablet Take 1 tablet (20 mEq total) by mouth 3 (three) times daily. For 5 days then as directed.  60 tablet  0  . [DISCONTINUED] prochlorperazine (COMPAZINE) 10 MG tablet Take 1 tablet (10 mg total) by mouth every 6 (six) hours as needed (Nausea or vomiting).  30 tablet  1  . [DISCONTINUED] prochlorperazine (COMPAZINE) 25 MG suppository Place 1 suppository (25 mg total) rectally every 12 (twelve) hours as needed for nausea.  12 suppository  3   No current facility-administered medications for this visit.    SURGICAL HISTORY:  Past Surgical History  Procedure Laterality Date  . Anal sphincterotomy  04/2011  . Hemorrhoid surgery  04/2011    ligation  . Breast lumpectomy with needle localization Left 02/04/2013    Procedure: LEFT BREAST LUMPECTOMY WITH NEEDLE LOCALIZATION;  Surgeon: Almond Lint, MD;  Location: [facility] OR;  Service: General;  Laterality: Left;  . Axillary lymph node dissection Left 02/04/2013    Procedure: LEFT AXILLARY LYMPH NODE DISSECTION;  Surgeon: Almond Lint, MD;  Location: [facility] OR;  Service: General;  Laterality: Left;  End: 1512  . Portacath placement Right 02/04/2013    Procedure: INSERTION PORT-A-CATH;  Surgeon: Almond Lint, MD;  Location: [facility] OR;  Service: General;  Laterality: Right;  Start Time: 1610.  Marland Kitchen Appendectomy  1980  . Breast surgery      Lumpectomy in april 2014    REVIEW OF SYSTEMS:   A 10 point review of systems was conducted and is otherwise negative except for what is noted above.    PHYSICAL EXAMINATION: Blood pressure 122/77, pulse 101, temperature 98 F (36.7 C),  temperature source Oral, resp. rate 18, height 5\' 2"  (1.575 m), weight 143 lb 12.8 oz (65.227 kg). Body mass index is 26.29 kg/(m^2). General: Patient is a well appearing female in no acute distress HEENT: PERRLA, sclerae anicteric no conjunctival pallor, MMM Neck: supple, no palpable adenopathy Lungs: clear to auscultation bilaterally, no wheezes, rhonchi, or rales Cardiovascular: regular rate rhythm, S1, S2, no murmurs, rubs or gallops Abdomen: Soft, non-tender, non-distended, normoactive bowel sounds, no HSM Extremities: warm and well perfused, no clubbing, cyanosis, or edema,  Skin: erythematous lesion on right wrist and right knee, drying, and now appearing as hyperpigmented skin.  Finger Nails are changing color and darkening, beginning to separate from the nail bed. Neuro: Non-focal Breasts: left breast lumpectomy site no nodularity, healing well, scar tissue underneath  axillary incision, no erythema, swelling, drainage, right breast no masses or nodularity.  ECOG PERFORMANCE STATUS: 1 - Symptomatic but completely ambulatory  LABORATORY DATA: Lab Results  Component Value Date   WBC 5.3 07/31/2013   HGB 9.2* 07/31/2013   HCT 28.2* 07/31/2013   MCV 97.6 07/31/2013   PLT 56* 07/31/2013      Chemistry      Component Value Date/Time   NA 144 07/31/2013 1256   NA 139 07/20/2013 0557   K 3.0* 07/31/2013 1256   K 3.7 07/20/2013 0557   CL 106 07/20/2013 0557   CL 100 04/23/2013 1447   CO2 29 07/31/2013 1256   CO2 26 07/20/2013 0557   BUN 8.2 07/31/2013 1256   BUN 8 07/20/2013 0557   CREATININE 0.7 07/31/2013 1256   CREATININE 0.58 07/20/2013 0557      Component Value Date/Time   CALCIUM 9.9 07/31/2013 1256   CALCIUM 9.1 07/20/2013 0557   ALKPHOS 67 07/31/2013 1256   ALKPHOS 32* 07/19/2013 0717   AST 28 07/31/2013 1256   AST 20 07/19/2013 0717   ALT 20 07/31/2013 1256   ALT 28 07/19/2013 0717   BILITOT 0.36 07/31/2013 1256   BILITOT 0.2* 07/19/2013 0717       RADIOGRAPHIC STUDIES:  No  results found.  ASSESSMENT: 57 year old female with new diagnosis of   #1 multifocal invasive ductal carcinoma grade 3 ER weakly positive at 3% PR negative HER-2/neu negative with Ki-67 of 90% tumor is very aggressive. She did have a palpable lymph node as well in the left axilla. Patient was seen in the multidisciplinary clinic for discussion of treatment options.   #2 patient underwent a lumpectomy with axillary lymph node dissection with Dr. Luz Brazen on 02/04/13. A 1.6 cm tumor was removed with 1/19 lymph nodes positive for disease.    #3 She was recommended adjuvant chemotherapy consisting of Adriamycin Cytoxan given dose dense for total of 4 cycles followed by Taxol carboplatinum weekly for 12 weeks. Rationale for this aggressive treatment was discussed with the patient since she is essentially has a triple-negative disease.  She received 4 cycles of Adriamycin/Cytoxan, and 9 cycles of Taxol/Carbo.  Due to rash likely from Taxol, patient will receive 2 cycles of Gemzar carbo.    #4 certainly patient will need post lumpectomy radiation therapy she was seen by radiation oncology. This will begin after patient has completed her chemotherapy.   #5 we discussed role of genetics, and she had an appointment with Maylon Cos on 03/06/13 the results were negative.       PLAN:  1. Doing well. Patient is thrombocytopenic today.  I reviewed the labs with the patient in detail and gave her reassurance.  She will not receive chemotherapy.    #2  We have referred her to [facility] for counseling. Her appoitnment is on 08/06/13.  #3 Patient will return next week for labs and eval for treatment.    All questions were answered. The patient knows to call the clinic with any problems, questions or concerns. We can certainly see the patient much sooner if necessary.  I spent 25 minutes counseling the patient face to face. The total time spent in the appointment was 30 minutes.  Cherie Ouch Lyn Hollingshead,  NP Medical Oncology [facility] Phone: [phone] 07/31/2013, 1:55 PM    ATTENDING'S ATTESTATION:  I personally reviewed patient's chart, examined patient myself, formulated the treatment plan as followed.    Patient seems to be doing well.  However unfortunately she does have cytopenia and do to her chemotherapy. I have explained to her that we will not give her any chemotherapy today. We will reevaluate her in one week to see if we will be able to restart her treatments. Patient certainly was very tearful but I have reassured her that this can be pretty normal in certain circumstances. She understood.  Patient will be seen back in one week's time for followup and possible chemotherapy.  Drue Second, MD Medical/Oncology [facility] [phone] (beeper) [phone] (Office)  08/25/2013, 9:32 AM

## 2013-07-31 NOTE — Telephone Encounter (Signed)
Message copied by Rexene Edison on Thu Jul 31, 2013  3:49 PM ------      Message from: Laural Golden      Created: Thu Jul 31, 2013  2:49 PM       Call patient with lab results.  Patient needs Kdur po bid x 7 days.  I will prescribe.  Call and inform potassium is 3.  We will re check next week.        ----- Message -----         From: [facility] Interface         Sent: 07/31/2013   1:07 PM           To: Victorino December, MD                   ------

## 2013-07-31 NOTE — Telephone Encounter (Signed)
, °

## 2013-07-31 NOTE — Telephone Encounter (Signed)
Per staff message and POF I have scheduled appts.  JMW  

## 2013-08-06 ENCOUNTER — Ambulatory Visit (INDEPENDENT_AMBULATORY_CARE_PROVIDER_SITE_OTHER): Payer: BC Managed Care – PPO | Admitting: Psychiatry

## 2013-08-06 DIAGNOSIS — F0630 Mood disorder due to known physiological condition, unspecified: Secondary | ICD-10-CM

## 2013-08-06 DIAGNOSIS — F064 Anxiety disorder due to known physiological condition: Secondary | ICD-10-CM

## 2013-08-07 ENCOUNTER — Encounter: Payer: Self-pay | Admitting: Adult Health

## 2013-08-07 ENCOUNTER — Ambulatory Visit: Payer: BC Managed Care – PPO

## 2013-08-07 ENCOUNTER — Ambulatory Visit (HOSPITAL_BASED_OUTPATIENT_CLINIC_OR_DEPARTMENT_OTHER): Payer: BC Managed Care – PPO | Admitting: Adult Health

## 2013-08-07 ENCOUNTER — Ambulatory Visit (HOSPITAL_BASED_OUTPATIENT_CLINIC_OR_DEPARTMENT_OTHER): Payer: BC Managed Care – PPO

## 2013-08-07 ENCOUNTER — Telehealth: Payer: Self-pay | Admitting: Oncology

## 2013-08-07 ENCOUNTER — Other Ambulatory Visit (HOSPITAL_BASED_OUTPATIENT_CLINIC_OR_DEPARTMENT_OTHER): Payer: BC Managed Care – PPO | Admitting: Lab

## 2013-08-07 VITALS — BP 128/84 | HR 102 | Temp 98.2°F | Resp 20 | Ht 62.0 in | Wt 143.7 lb

## 2013-08-07 DIAGNOSIS — C50212 Malignant neoplasm of upper-inner quadrant of left female breast: Secondary | ICD-10-CM

## 2013-08-07 DIAGNOSIS — C50219 Malignant neoplasm of upper-inner quadrant of unspecified female breast: Secondary | ICD-10-CM

## 2013-08-07 DIAGNOSIS — C50919 Malignant neoplasm of unspecified site of unspecified female breast: Secondary | ICD-10-CM

## 2013-08-07 DIAGNOSIS — Z170 Estrogen receptor positive status [ER+]: Secondary | ICD-10-CM

## 2013-08-07 DIAGNOSIS — C50912 Malignant neoplasm of unspecified site of left female breast: Secondary | ICD-10-CM

## 2013-08-07 DIAGNOSIS — Z5111 Encounter for antineoplastic chemotherapy: Secondary | ICD-10-CM

## 2013-08-07 LAB — CBC WITH DIFFERENTIAL/PLATELET
BASO%: 0.2 % (ref 0.0–2.0)
EOS%: 0.1 % (ref 0.0–7.0)
HCT: 28.7 % — ABNORMAL LOW (ref 34.8–46.6)
MCH: 31.7 pg (ref 25.1–34.0)
MCHC: 32.7 g/dL (ref 31.5–36.0)
MCV: 96.9 fL (ref 79.5–101.0)
MONO%: 14.8 % — ABNORMAL HIGH (ref 0.0–14.0)
NEUT#: 2.1 10*3/uL (ref 1.5–6.5)
NEUT%: 49.7 % (ref 38.4–76.8)
Platelets: 202 10*3/uL (ref 145–400)
RBC: 2.96 10*6/uL — ABNORMAL LOW (ref 3.70–5.45)
RDW: 20.4 % — ABNORMAL HIGH (ref 11.2–14.5)

## 2013-08-07 LAB — COMPREHENSIVE METABOLIC PANEL (CC13)
AST: 30 U/L (ref 5–34)
Albumin: 3.4 g/dL — ABNORMAL LOW (ref 3.5–5.0)
Alkaline Phosphatase: 66 U/L (ref 40–150)
Potassium: 4.5 mEq/L (ref 3.5–5.1)
Sodium: 141 mEq/L (ref 136–145)
Total Protein: 6.3 g/dL — ABNORMAL LOW (ref 6.4–8.3)

## 2013-08-07 MED ORDER — DEXAMETHASONE SODIUM PHOSPHATE 10 MG/ML IJ SOLN
INTRAMUSCULAR | Status: AC
  Filled 2013-08-07: qty 1

## 2013-08-07 MED ORDER — DEXAMETHASONE SODIUM PHOSPHATE 10 MG/ML IJ SOLN
10.0000 mg | Freq: Once | INTRAMUSCULAR | Status: AC
  Administered 2013-08-07: 10 mg via INTRAVENOUS

## 2013-08-07 MED ORDER — ONDANSETRON 8 MG/NS 50 ML IVPB
INTRAVENOUS | Status: AC
  Filled 2013-08-07: qty 8

## 2013-08-07 MED ORDER — HEPARIN SOD (PORK) LOCK FLUSH 100 UNIT/ML IV SOLN
500.0000 [IU] | Freq: Once | INTRAVENOUS | Status: AC | PRN
  Administered 2013-08-07: 500 [IU]
  Filled 2013-08-07: qty 5

## 2013-08-07 MED ORDER — SODIUM CHLORIDE 0.9 % IJ SOLN
10.0000 mL | INTRAMUSCULAR | Status: DC | PRN
  Administered 2013-08-07: 10 mL
  Filled 2013-08-07: qty 10

## 2013-08-07 MED ORDER — SODIUM CHLORIDE 0.9 % IV SOLN
Freq: Once | INTRAVENOUS | Status: AC
  Administered 2013-08-07: 14:00:00 via INTRAVENOUS

## 2013-08-07 MED ORDER — SODIUM CHLORIDE 0.9 % IV SOLN
800.0000 mg/m2 | Freq: Once | INTRAVENOUS | Status: AC
  Administered 2013-08-07: 1330 mg via INTRAVENOUS
  Filled 2013-08-07: qty 34.98

## 2013-08-07 MED ORDER — SODIUM CHLORIDE 0.9 % IV SOLN
210.0000 mg | Freq: Once | INTRAVENOUS | Status: AC
  Administered 2013-08-07: 210 mg via INTRAVENOUS
  Filled 2013-08-07: qty 21

## 2013-08-07 MED ORDER — ONDANSETRON 8 MG/50ML IVPB (CHCC)
8.0000 mg | Freq: Once | INTRAVENOUS | Status: AC
  Administered 2013-08-07: 8 mg via INTRAVENOUS

## 2013-08-07 NOTE — Patient Instructions (Addendum)
Doing well.  Proceed with chemotherapy.  Increase neurontin to two tablets in the morning and three tablets in the evening.  Please call us if you have any questions or concerns.

## 2013-08-07 NOTE — Telephone Encounter (Signed)
, °

## 2013-08-07 NOTE — Progress Notes (Addendum)
OFFICE PROGRESS NOTE  CC**  [name],[name], MD [address], [address] [place] [address]  DIAGNOSIS: 57 year old female with stage IIA ER weakly positive, PR negative HER-2/neu negative, invasive ductal carcinoma of the left breast.   PRIOR THERAPY: 1. Earlier this year it on routine screening mammography the patient was noted to have a abnormality within the 12:00 position of the left breast. Patient returned for additional imaging with 3-[name], tomosynthesis. Within the left breast there were 2 masses somewhat lobulated with irregular margins in the upper inner aspect of the left breast. These were separated by approximately a centimeter. On ultrasound the lesion the 11:00 position measured 1.0 x 0.7 cm. The 12:00 lesion measured 1.6 x 0.9 cm. In addition within the axillary region there was a 2.1 x 1.7 suspicious appearing lymph node.. The patient proceeded to undergo ultrasound-guided biopsy of the 2 areas within the left breast as well as the suspicious left axillary lymph node. Biopsies from both breast areas revealed invasive ductal carcinoma. In addition the lymph node from the left axilla revealed ductal carcinoma. The lesions appeared to be high-grade. There was no HER-2/neu amplification and hormone staining was minimal consistent with essentially consistent with triple negative breast cancer.  Patient's case was discussed at the multidisciplinary breast conference her pathology and radiology were reviewed.  2. Patient underwent lumpectomy of the left breast with Dr. Donell Beers on 02/04/13, a 1.6cm tumor was removed along with an axillary lymph node dissection that found 1/19 nodes positive for metastatic disease.    3. Patient was then started on adjuvant chemotherapy on 03/05/13 with Adriamycin/Cytoxan. She began Taxol Carbo on 05/07/13, this was discontinued after 9 cycles due to rash.  She will start Gemzar carbo on 9/25 for two cycles.  CURRENT THERAPY:  Gemzar Carbo cycle 1 day  1  INTERVAL HISTORY: Kristi [name] 57 y.o. female returns for evaluation prior to Gemzar Carbo.  She is doing well today.  She continues to have some numbness in her fingertips and feet, but otherwise is well and without concerns.  Her platelets have recovered, and she is otherwise ready to proceed with chemotherapy.  Her mood is improved.    MEDICAL HISTORY: Past Medical History  Diagnosis Date  . Anxiety   . GERD (gastroesophageal reflux disease)   . Hypertension   . MRSA (methicillin resistant Staphylococcus aureus) 2009  . Depression   . Anemia     Iron deficinecy anemia  . Breast cancer     ALLERGIES:  has No Known Allergies.  MEDICATIONS:  Current Outpatient Prescriptions  Medication Sig Dispense Refill  . ALPRAZolam (XANAX) 1 MG tablet Take 2 mg by mouth at bedtime as needed for sleep.       Marland Kitchen amphetamine-dextroamphetamine (ADDERALL) 10 MG tablet Take 20 mg by mouth daily.       Marland Kitchen aspirin EC 81 MG EC tablet Take 1 tablet (81 mg total) by mouth daily.  30 tablet  0  . atorvastatin (LIPITOR) 40 MG tablet Take 1 tablet (40 mg total) by mouth daily at 6 PM.  30 tablet  1  . B Complex-C (SUPER B COMPLEX PO) Take 1 tablet by mouth daily.      . ciprofloxacin (CIPRO) 500 MG tablet Take 1 tablet (500 mg total) by mouth 2 (two) times daily.  14 tablet  0  . docusate sodium (COLACE) 100 MG capsule Take 100 mg by mouth 2 (two) times daily as needed for constipation.      Marland Kitchen  escitalopram (LEXAPRO) 20 MG tablet Take 1 tablet (20 mg total) by mouth daily.  60 tablet  6  . esomeprazole (NEXIUM) 40 MG capsule Take 40 mg by mouth daily as needed. For reflux      . gabapentin (NEURONTIN) 100 MG capsule Take 1 capsule (100 mg total) by mouth 3 (three) times daily.  90 capsule  2  . lidocaine-prilocaine (EMLA) cream Apply topically as needed.  30 g  7  . Multiple Vitamin (MULTIVITAMIN) tablet Take 1 tablet by mouth daily.      . Omega-3 Fatty Acids (FISH OIL PO) Take 1 capsule by mouth daily.       Marland Kitchen OVER THE COUNTER MEDICATION Take by mouth as needed.      . potassium chloride SA (K-DUR,KLOR-CON) 20 MEQ tablet Take 1 tablet (20 mEq total) by mouth 3 (three) times daily. For 5 days then as directed.  60 tablet  0  . triamcinolone ointment (KENALOG) 0.5 % Apply topically 2 (two) times daily.  30 g  0  . valsartan-hydrochlorothiazide (DIOVAN-HCT) 320-25 MG per tablet Take 0.5 tablets by mouth daily.       . [DISCONTINUED] prochlorperazine (COMPAZINE) 10 MG tablet Take 1 tablet (10 mg total) by mouth every 6 (six) hours as needed (Nausea or vomiting).  30 tablet  1  . [DISCONTINUED] prochlorperazine (COMPAZINE) 25 MG suppository Place 1 suppository (25 mg total) rectally every 12 (twelve) hours as needed for nausea.  12 suppository  3   No current facility-administered medications for this visit.    SURGICAL HISTORY:  Past Surgical History  Procedure Laterality Date  . Anal sphincterotomy  04/2011  . Hemorrhoid surgery  04/2011    ligation  . Breast lumpectomy with needle localization Left 02/04/2013    Procedure: LEFT BREAST LUMPECTOMY WITH NEEDLE LOCALIZATION;  Surgeon: Almond Lint, MD;  Location: [facility] OR;  Service: General;  Laterality: Left;  . Axillary lymph node dissection Left 02/04/2013    Procedure: LEFT AXILLARY LYMPH NODE DISSECTION;  Surgeon: Almond Lint, MD;  Location: [facility] OR;  Service: General;  Laterality: Left;  End: 1512  . Portacath placement Right 02/04/2013    Procedure: INSERTION PORT-A-CATH;  Surgeon: Almond Lint, MD;  Location: [facility] OR;  Service: General;  Laterality: Right;  Start Time: 1610.  Marland Kitchen Appendectomy  1980  . Breast surgery      Lumpectomy in april 2014    REVIEW OF SYSTEMS:   A 10 point review of systems was conducted and is otherwise negative except for what is noted above.    PHYSICAL EXAMINATION: Blood pressure 128/84, pulse 102, temperature 98.2 F (36.8 C), temperature source Oral, resp. rate 20, height 5\' 2"  (1.575 m), weight 143 lb 11.2 oz (65.182  kg). Body mass index is 26.28 kg/(m^2). General: Patient is a well appearing female in no acute distress HEENT: PERRLA, sclerae anicteric no conjunctival pallor, MMM Neck: supple, no palpable adenopathy Lungs: clear to auscultation bilaterally, no wheezes, rhonchi, or rales Cardiovascular: regular rate rhythm, S1, S2, no murmurs, rubs or gallops Abdomen: Soft, non-tender, non-distended, normoactive bowel sounds, no HSM Extremities: warm and well perfused, no clubbing, cyanosis, or edema,  Skin: No rashes, lesions.  Finger Nails have changed color and darkened, beginning to separate from the nail bed. Neuro: Non-focal Breasts: left breast lumpectomy site no nodularity, healing well, scar tissue underneath axillary incision, no erythema, swelling, drainage, right breast no masses or nodularity.  ECOG PERFORMANCE STATUS: 1 - Symptomatic but completely ambulatory  LABORATORY DATA:  Lab Results  Component Value Date   WBC 4.2 08/07/2013   HGB 9.4* 08/07/2013   HCT 28.7* 08/07/2013   MCV 96.9 08/07/2013   PLT 202 08/07/2013      Chemistry      Component Value Date/Time   NA 144 07/31/2013 1256   NA 139 07/20/2013 0557   K 3.0* 07/31/2013 1256   K 3.7 07/20/2013 0557   CL 106 07/20/2013 0557   CL 100 04/23/2013 1447   CO2 29 07/31/2013 1256   CO2 26 07/20/2013 0557   BUN 8.2 07/31/2013 1256   BUN 8 07/20/2013 0557   CREATININE 0.7 07/31/2013 1256   CREATININE 0.58 07/20/2013 0557      Component Value Date/Time   CALCIUM 9.9 07/31/2013 1256   CALCIUM 9.1 07/20/2013 0557   ALKPHOS 67 07/31/2013 1256   ALKPHOS 32* 07/19/2013 0717   AST 28 07/31/2013 1256   AST 20 07/19/2013 0717   ALT 20 07/31/2013 1256   ALT 28 07/19/2013 0717   BILITOT 0.36 07/31/2013 1256   BILITOT 0.2* 07/19/2013 0717       RADIOGRAPHIC STUDIES:  No results found.  ASSESSMENT: 57 year old female with new diagnosis of   #1 multifocal invasive ductal carcinoma grade 3 ER weakly positive at 3% PR negative HER-2/neu negative  with Ki-67 of 90% tumor is very aggressive. She did have a palpable lymph node as well in the left axilla. Patient was seen in the multidisciplinary clinic for discussion of treatment options.   #2 patient underwent a lumpectomy with axillary lymph node dissection with Dr. Luz Brazen on 02/04/13. A 1.6 cm tumor was removed with 1/19 lymph nodes positive for disease.    #3 She was recommended adjuvant chemotherapy consisting of Adriamycin Cytoxan given dose dense for total of 4 cycles followed by Taxol carboplatinum weekly for 12 weeks. Rationale for this aggressive treatment was discussed with the patient since she is essentially has a triple-negative disease.  She received 4 cycles of Adriamycin/Cytoxan, and 9 cycles of Taxol/Carbo.  Due to rash likely from Taxol it was discontinued, patient will receive 2 cycles of Gemzar carbo given on a q 14 day schedule.    #4 certainly patient will need post lumpectomy radiation therapy she was seen by radiation oncology. This will begin after patient has completed her chemotherapy.   #5 we discussed role of genetics, and she had an appointment with Maylon Cos on 03/06/13 the results were negative.       PLAN:  1. Doing well. Her labs have recovered.  Patient will proceed with Gemzar carbo today.  I requested more appointments.  She has reviewed the new chemotherapy information in the details I printed out for her in her AVS.  She does not have any questions about the change.     #2  Patient will continue to follow with Enzo Bi as scheduled.    #3 Patient will return tomorrow for Neulasta, and next week for labs and appointment.    All questions were answered. The patient knows to call the clinic with any problems, questions or concerns. We can certainly see the patient much sooner if necessary.  I spent 25 minutes counseling the patient face to face. The total time spent in the appointment was 30 minutes.  Cherie Ouch Lyn Hollingshead, NP Medical Oncology [facility] Phone: [phone] 08/07/2013, 12:23 PM  ATTENDING'S ATTESTATION:  I personally reviewed patient's chart, examined patient myself, formulated the treatment plan as followed.  After a delay in her chemotherapy her counts have finally improved with improvement in the platelets. We therefore will proceed with her scheduled Gemzar carboplatinum. She will also receive Neulasta injection tomorrow.  For ongoing psychosocial issues we have referred her to Dr. Nigel Berthold. She also has her own psychiatrist. I do think that addressing her psychological issues is extremely important. I have emphasized this to the patient.  She will be seen back in one week's time for followup and lab work.  Drue Second, MD Medical/Oncology [facility] [phone] (beeper) [phone] (Office)

## 2013-08-07 NOTE — Patient Instructions (Addendum)
[facility] Discharge Instructions for Patients Receiving Chemotherapy  Today you received the following chemotherapy agents: Carboplatin and Gemzar.  To help prevent nausea and vomiting after your treatment, we encourage you to take your nausea medication as needed.   If you develop nausea and vomiting that is not controlled by your nausea medication, call the clinic.   BELOW ARE SYMPTOMS THAT SHOULD BE REPORTED IMMEDIATELY:  *FEVER GREATER THAN 100.5 F  *CHILLS WITH OR WITHOUT FEVER  NAUSEA AND VOMITING THAT IS NOT CONTROLLED WITH YOUR NAUSEA MEDICATION  *UNUSUAL SHORTNESS OF BREATH  *UNUSUAL BRUISING OR BLEEDING  TENDERNESS IN MOUTH AND THROAT WITH OR WITHOUT PRESENCE OF ULCERS  *URINARY PROBLEMS  *BOWEL PROBLEMS  UNUSUAL RASH Items with * indicate a potential emergency and should be followed up as soon as possible.  Feel free to call the clinic should you have any questions or concerns. The clinic phone number is [phone].

## 2013-08-07 NOTE — Progress Notes (Signed)
Discharged at 1615 with spouse in no distress.

## 2013-08-08 ENCOUNTER — Telehealth: Payer: Self-pay | Admitting: *Deleted

## 2013-08-08 ENCOUNTER — Ambulatory Visit (HOSPITAL_BASED_OUTPATIENT_CLINIC_OR_DEPARTMENT_OTHER): Payer: BC Managed Care – PPO

## 2013-08-08 VITALS — BP 115/63 | HR 93 | Temp 98.4°F

## 2013-08-08 DIAGNOSIS — Z5189 Encounter for other specified aftercare: Secondary | ICD-10-CM

## 2013-08-08 DIAGNOSIS — C50219 Malignant neoplasm of upper-inner quadrant of unspecified female breast: Secondary | ICD-10-CM

## 2013-08-08 DIAGNOSIS — C50212 Malignant neoplasm of upper-inner quadrant of left female breast: Secondary | ICD-10-CM

## 2013-08-08 MED ORDER — PEGFILGRASTIM INJECTION 6 MG/0.6ML
6.0000 mg | Freq: Once | SUBCUTANEOUS | Status: AC
  Administered 2013-08-08: 6 mg via SUBCUTANEOUS
  Filled 2013-08-08: qty 0.6

## 2013-08-08 NOTE — Telephone Encounter (Signed)
Per staff message and POF I have scheduled appts.  JMW  

## 2013-08-08 NOTE — Telephone Encounter (Signed)
Darshay here for Neulasta injection following 1st time new drug gemzar carbo chemotherapy.  She states that she is doing fine  No problems(nausea, vomiting,diarrhea)  All questions answered.  Knows to call if she has any problems or concerns.

## 2013-08-14 ENCOUNTER — Ambulatory Visit (HOSPITAL_BASED_OUTPATIENT_CLINIC_OR_DEPARTMENT_OTHER): Payer: BC Managed Care – PPO | Admitting: Adult Health

## 2013-08-14 ENCOUNTER — Encounter: Payer: Self-pay | Admitting: Emergency Medicine

## 2013-08-14 ENCOUNTER — Other Ambulatory Visit (HOSPITAL_BASED_OUTPATIENT_CLINIC_OR_DEPARTMENT_OTHER): Payer: BC Managed Care – PPO | Admitting: Lab

## 2013-08-14 ENCOUNTER — Telehealth: Payer: Self-pay | Admitting: Oncology

## 2013-08-14 ENCOUNTER — Encounter: Payer: Self-pay | Admitting: Adult Health

## 2013-08-14 ENCOUNTER — Encounter: Payer: Self-pay | Admitting: Oncology

## 2013-08-14 VITALS — BP 108/70 | HR 77 | Temp 98.0°F | Resp 20 | Ht 62.0 in | Wt 140.7 lb

## 2013-08-14 DIAGNOSIS — C50919 Malignant neoplasm of unspecified site of unspecified female breast: Secondary | ICD-10-CM

## 2013-08-14 DIAGNOSIS — C50212 Malignant neoplasm of upper-inner quadrant of left female breast: Secondary | ICD-10-CM

## 2013-08-14 DIAGNOSIS — C50219 Malignant neoplasm of upper-inner quadrant of unspecified female breast: Secondary | ICD-10-CM

## 2013-08-14 DIAGNOSIS — C50912 Malignant neoplasm of unspecified site of left female breast: Secondary | ICD-10-CM

## 2013-08-14 LAB — CBC WITH DIFFERENTIAL/PLATELET
BASO%: 0.2 % (ref 0.0–2.0)
Eosinophils Absolute: 0 10*3/uL (ref 0.0–0.5)
MCHC: 32.5 g/dL (ref 31.5–36.0)
MONO#: 1.7 10*3/uL — ABNORMAL HIGH (ref 0.1–0.9)
NEUT#: 13.8 10*3/uL — ABNORMAL HIGH (ref 1.5–6.5)
RBC: 3.09 10*6/uL — ABNORMAL LOW (ref 3.70–5.45)
RDW: 19.7 % — ABNORMAL HIGH (ref 11.2–14.5)
WBC: 17.8 10*3/uL — ABNORMAL HIGH (ref 3.9–10.3)
lymph#: 2.2 10*3/uL (ref 0.9–3.3)

## 2013-08-14 LAB — COMPREHENSIVE METABOLIC PANEL (CC13)
ALT: 24 U/L (ref 0–55)
Albumin: 3.6 g/dL (ref 3.5–5.0)
BUN: 9.6 mg/dL (ref 7.0–26.0)
Calcium: 10 mg/dL (ref 8.4–10.4)
Chloride: 103 mEq/L (ref 98–109)
Creatinine: 0.7 mg/dL (ref 0.6–1.1)
Glucose: 100 mg/dl (ref 70–140)
Potassium: 3.9 mEq/L (ref 3.5–5.1)

## 2013-08-14 NOTE — Progress Notes (Signed)
[address] Kegler @ [place], [phone], to extend patient's disability past 08/21/13; he requested office notes to be faxed to him @ [phone].

## 2013-08-14 NOTE — Progress Notes (Signed)
OFFICE PROGRESS NOTE  CC**  [name],[name], MD [address], [address] [place] [address]  DIAGNOSIS: 57 year old female with stage IIA ER weakly positive, PR negative HER-2/neu negative, invasive ductal carcinoma of the left breast.   PRIOR THERAPY: 1. Earlier this year it on routine screening mammography the patient was noted to have a abnormality within the 12:00 position of the left breast. Patient returned for additional imaging with 3-[name], tomosynthesis. Within the left breast there were 2 masses somewhat lobulated with irregular margins in the upper inner aspect of the left breast. These were separated by approximately a centimeter. On ultrasound the lesion the 11:00 position measured 1.0 x 0.7 cm. The 12:00 lesion measured 1.6 x 0.9 cm. In addition within the axillary region there was a 2.1 x 1.7 suspicious appearing lymph node.. The patient proceeded to undergo ultrasound-guided biopsy of the 2 areas within the left breast as well as the suspicious left axillary lymph node. Biopsies from both breast areas revealed invasive ductal carcinoma. In addition the lymph node from the left axilla revealed ductal carcinoma. The lesions appeared to be high-grade. There was no HER-2/neu amplification and hormone staining was minimal consistent with essentially consistent with triple negative breast cancer.  Patient's case was discussed at the multidisciplinary breast conference her pathology and radiology were reviewed.  2. Patient underwent lumpectomy of the left breast with Dr. Donell Beers on 02/04/13, a 1.6cm tumor was removed along with an axillary lymph node dissection that found 1/19 nodes positive for metastatic disease.    3. Patient was then started on adjuvant chemotherapy on 03/05/13 with Adriamycin/Cytoxan. She began Taxol Carbo on 05/07/13, this was discontinued after 9 cycles due to rash.  She started Gemzar carbo on 9/25 for two cycles.  CURRENT THERAPY:  Gemzar Carbo cycle 1 day  8  INTERVAL HISTORY: Kristi [name] 57 y.o. female returns for evaluation following Gemzar Carbo.  She is doing well today.  She is c/o insomnia.  She is taking Xanax at bedtime and it helps.  She also takes Gabapentin at bedtime.  Otherwise, she denies fevers, chills, nausea, vomiting, constipation, diarrhea.  The numbness is improved in her fingertips and toes.  She continues to take neurontin as directed.    MEDICAL HISTORY: Past Medical History  Diagnosis Date  . Anxiety   . GERD (gastroesophageal reflux disease)   . Hypertension   . MRSA (methicillin resistant Staphylococcus aureus) 2009  . Depression   . Anemia     Iron deficinecy anemia  . Breast cancer     ALLERGIES:  has No Known Allergies.  MEDICATIONS:  Current Outpatient Prescriptions  Medication Sig Dispense Refill  . ALPRAZolam (XANAX) 1 MG tablet Take 2 mg by mouth at bedtime as needed for sleep.       Marland Kitchen amphetamine-dextroamphetamine (ADDERALL) 10 MG tablet Take 20 mg by mouth daily.       Marland Kitchen aspirin EC 81 MG EC tablet Take 1 tablet (81 mg total) by mouth daily.  30 tablet  0  . atorvastatin (LIPITOR) 40 MG tablet Take 1 tablet (40 mg total) by mouth daily at 6 PM.  30 tablet  1  . B Complex-C (SUPER B COMPLEX PO) Take 1 tablet by mouth daily.      Marland Kitchen docusate sodium (COLACE) 100 MG capsule Take 100 mg by mouth 2 (two) times daily as needed for constipation.      Marland Kitchen escitalopram (LEXAPRO) 20 MG tablet Take 1 tablet (20 mg  total) by mouth daily.  60 tablet  6  . esomeprazole (NEXIUM) 40 MG capsule Take 40 mg by mouth daily as needed. For reflux      . gabapentin (NEURONTIN) 100 MG capsule Take 1 capsule (100 mg total) by mouth 3 (three) times daily.  90 capsule  2  . lidocaine-prilocaine (EMLA) cream Apply topically as needed.  30 g  7  . Multiple Vitamin (MULTIVITAMIN) tablet Take 1 tablet by mouth daily.      . Omega-3 Fatty Acids (FISH OIL PO) Take 1 capsule by mouth daily.      Marland Kitchen OVER THE COUNTER MEDICATION Take by  mouth as needed.      . potassium chloride SA (K-DUR,KLOR-CON) 20 MEQ tablet Take 1 tablet (20 mEq total) by mouth 3 (three) times daily. For 5 days then as directed.  60 tablet  0  . valsartan-hydrochlorothiazide (DIOVAN-HCT) 320-25 MG per tablet Take 0.5 tablets by mouth daily.       . ciprofloxacin (CIPRO) 500 MG tablet Take 1 tablet (500 mg total) by mouth 2 (two) times daily.  14 tablet  0  . triamcinolone ointment (KENALOG) 0.5 % Apply topically 2 (two) times daily.  30 g  0  . [DISCONTINUED] prochlorperazine (COMPAZINE) 10 MG tablet Take 1 tablet (10 mg total) by mouth every 6 (six) hours as needed (Nausea or vomiting).  30 tablet  1  . [DISCONTINUED] prochlorperazine (COMPAZINE) 25 MG suppository Place 1 suppository (25 mg total) rectally every 12 (twelve) hours as needed for nausea.  12 suppository  3   No current facility-administered medications for this visit.    SURGICAL HISTORY:  Past Surgical History  Procedure Laterality Date  . Anal sphincterotomy  04/2011  . Hemorrhoid surgery  04/2011    ligation  . Breast lumpectomy with needle localization Left 02/04/2013    Procedure: LEFT BREAST LUMPECTOMY WITH NEEDLE LOCALIZATION;  Surgeon: Almond Lint, MD;  Location: [facility] OR;  Service: General;  Laterality: Left;  . Axillary lymph node dissection Left 02/04/2013    Procedure: LEFT AXILLARY LYMPH NODE DISSECTION;  Surgeon: Almond Lint, MD;  Location: [facility] OR;  Service: General;  Laterality: Left;  End: 1512  . Portacath placement Right 02/04/2013    Procedure: INSERTION PORT-A-CATH;  Surgeon: Almond Lint, MD;  Location: [facility] OR;  Service: General;  Laterality: Right;  Start Time: 5621.  Marland Kitchen Appendectomy  1980  . Breast surgery      Lumpectomy in april 2014    REVIEW OF SYSTEMS:   A 10 point review of systems was conducted and is otherwise negative except for what is noted above.    PHYSICAL EXAMINATION: Blood pressure 108/70, pulse 77, temperature 98 F (36.7 C), temperature source Oral,  resp. rate 20, height 5\' 2"  (1.575 m), weight 140 lb 11.2 oz (63.821 kg). Body mass index is 25.73 kg/(m^2). General: Patient is a well appearing female in no acute distress HEENT: PERRLA, sclerae anicteric no conjunctival pallor, MMM Neck: supple, no palpable adenopathy Lungs: clear to auscultation bilaterally, no wheezes, rhonchi, or rales Cardiovascular: regular rate rhythm, S1, S2, no murmurs, rubs or gallops Abdomen: Soft, non-tender, non-distended, normoactive bowel sounds, no HSM Extremities: warm and well perfused, no clubbing, cyanosis, or edema,  Skin: No rashes, lesions.  Finger Nails have changed color and darkened, beginning to separate from the nail bed. Neuro: Non-focal Breasts: left breast lumpectomy site no nodularity, healing well, scar tissue underneath axillary incision, no erythema, swelling, drainage, right breast no masses or  nodularity.  ECOG PERFORMANCE STATUS: 1 - Symptomatic but completely ambulatory  LABORATORY DATA: Lab Results  Component Value Date   WBC 17.8* 08/14/2013   HGB 9.7* 08/14/2013   HCT 29.9* 08/14/2013   MCV 97.0 08/14/2013   PLT 211 08/14/2013      Chemistry      Component Value Date/Time   NA 140 08/14/2013 1320   NA 139 07/20/2013 0557   K 3.9 08/14/2013 1320   K 3.7 07/20/2013 0557   CL 106 07/20/2013 0557   CL 100 04/23/2013 1447   CO2 29 08/14/2013 1320   CO2 26 07/20/2013 0557   BUN 9.6 08/14/2013 1320   BUN 8 07/20/2013 0557   CREATININE 0.7 08/14/2013 1320   CREATININE 0.58 07/20/2013 0557      Component Value Date/Time   CALCIUM 10.0 08/14/2013 1320   CALCIUM 9.1 07/20/2013 0557   ALKPHOS 119 08/14/2013 1320   ALKPHOS 32* 07/19/2013 0717   AST 19 08/14/2013 1320   AST 20 07/19/2013 0717   ALT 24 08/14/2013 1320   ALT 28 07/19/2013 0717   BILITOT 0.29 08/14/2013 1320   BILITOT 0.2* 07/19/2013 0717       RADIOGRAPHIC STUDIES:  No results found.  ASSESSMENT: 57 year old female with new diagnosis of   #1 multifocal invasive ductal  carcinoma grade 3 ER weakly positive at 3% PR negative HER-2/neu negative with Ki-67 of 90% tumor is very aggressive. She did have a palpable lymph node as well in the left axilla. Patient was seen in the multidisciplinary clinic for discussion of treatment options.   #2 patient underwent a lumpectomy with axillary lymph node dissection with Dr. Luz Brazen on 02/04/13. A 1.6 cm tumor was removed with 1/19 lymph nodes positive for disease.    #3 She was recommended adjuvant chemotherapy consisting of Adriamycin Cytoxan given dose dense for total of 4 cycles followed by Taxol carboplatinum weekly for 12 weeks. Rationale for this aggressive treatment was discussed with the patient since she is essentially has a triple-negative disease.  She received 4 cycles of Adriamycin/Cytoxan, and 9 cycles of Taxol/Carbo.  Due to rash likely from Taxol it was discontinued, patient will receive 2 cycles of Gemzar carbo given on a q 14 day schedule.    #4 certainly patient will need post lumpectomy radiation therapy she was seen by radiation oncology. This will begin after patient has completed her chemotherapy.   #5 we discussed role of genetics, and she had an appointment with Maylon Cos on 03/06/13 the results were negative.       PLAN:  1. Doing well. Labs are stable.  I reviewed them with her in detail.    #2  Patient will continue to follow with Enzo Bi as scheduled.    #3 Patient will return in one week for labs, evaluation, and chemotherapy.      All questions were answered. The patient knows to call the clinic with any problems, questions or concerns. We can certainly see the patient much sooner if necessary.  I spent 25 minutes counseling the patient face to face. The total time spent in the appointment was 30 minutes.  Cherie Ouch Lyn Hollingshead, NP Medical Oncology [facility] Phone: [phone] 08/16/2013, 11:36 AM

## 2013-08-14 NOTE — Patient Instructions (Signed)
Doing well.  We will see you back next week for your next chemotherapy.  Please call us if you have any questions or concerns.

## 2013-08-14 NOTE — Telephone Encounter (Signed)
, °

## 2013-08-19 ENCOUNTER — Ambulatory Visit: Payer: BC Managed Care – PPO | Admitting: Psychiatry

## 2013-08-21 ENCOUNTER — Other Ambulatory Visit: Payer: Self-pay | Admitting: *Deleted

## 2013-08-21 ENCOUNTER — Ambulatory Visit (HOSPITAL_BASED_OUTPATIENT_CLINIC_OR_DEPARTMENT_OTHER): Payer: BC Managed Care – PPO | Admitting: Adult Health

## 2013-08-21 ENCOUNTER — Telehealth: Payer: Self-pay | Admitting: Oncology

## 2013-08-21 ENCOUNTER — Other Ambulatory Visit (HOSPITAL_BASED_OUTPATIENT_CLINIC_OR_DEPARTMENT_OTHER): Payer: BC Managed Care – PPO | Admitting: Lab

## 2013-08-21 ENCOUNTER — Encounter: Payer: Self-pay | Admitting: Adult Health

## 2013-08-21 ENCOUNTER — Ambulatory Visit (HOSPITAL_BASED_OUTPATIENT_CLINIC_OR_DEPARTMENT_OTHER): Payer: BC Managed Care – PPO

## 2013-08-21 VITALS — BP 130/79 | HR 96 | Temp 98.9°F | Resp 18 | Ht 62.0 in | Wt 142.7 lb

## 2013-08-21 DIAGNOSIS — C50212 Malignant neoplasm of upper-inner quadrant of left female breast: Secondary | ICD-10-CM

## 2013-08-21 DIAGNOSIS — C50219 Malignant neoplasm of upper-inner quadrant of unspecified female breast: Secondary | ICD-10-CM

## 2013-08-21 DIAGNOSIS — Z171 Estrogen receptor negative status [ER-]: Secondary | ICD-10-CM

## 2013-08-21 DIAGNOSIS — N63 Unspecified lump in unspecified breast: Secondary | ICD-10-CM

## 2013-08-21 DIAGNOSIS — Z5111 Encounter for antineoplastic chemotherapy: Secondary | ICD-10-CM

## 2013-08-21 LAB — COMPREHENSIVE METABOLIC PANEL (CC13)
ALT: 20 U/L (ref 0–55)
AST: 21 U/L (ref 5–34)
Albumin: 3.7 g/dL (ref 3.5–5.0)
Anion Gap: 7 mEq/L (ref 3–11)
CO2: 28 mEq/L (ref 22–29)
Sodium: 141 mEq/L (ref 136–145)
Total Bilirubin: 0.25 mg/dL (ref 0.20–1.20)
Total Protein: 6.4 g/dL (ref 6.4–8.3)

## 2013-08-21 LAB — CBC WITH DIFFERENTIAL/PLATELET
BASO%: 0.1 % (ref 0.0–2.0)
EOS%: 0.1 % (ref 0.0–7.0)
Eosinophils Absolute: 0 10*3/uL (ref 0.0–0.5)
HCT: 30.2 % — ABNORMAL LOW (ref 34.8–46.6)
MCHC: 32.1 g/dL (ref 31.5–36.0)
MCV: 95.6 fL (ref 79.5–101.0)
MONO#: 0.8 10*3/uL (ref 0.1–0.9)
MONO%: 9.8 % (ref 0.0–14.0)
NEUT#: 5.5 10*3/uL (ref 1.5–6.5)
NEUT%: 66.6 % (ref 38.4–76.8)
RBC: 3.16 10*6/uL — ABNORMAL LOW (ref 3.70–5.45)
RDW: 17.7 % — ABNORMAL HIGH (ref 11.2–14.5)
WBC: 8.3 10*3/uL (ref 3.9–10.3)
nRBC: 0 % (ref 0–0)

## 2013-08-21 MED ORDER — ONDANSETRON 8 MG/NS 50 ML IVPB
INTRAVENOUS | Status: AC
  Filled 2013-08-21: qty 8

## 2013-08-21 MED ORDER — SODIUM CHLORIDE 0.9 % IV SOLN
Freq: Once | INTRAVENOUS | Status: AC
  Administered 2013-08-21: 15:00:00 via INTRAVENOUS

## 2013-08-21 MED ORDER — DEXAMETHASONE SODIUM PHOSPHATE 10 MG/ML IJ SOLN
10.0000 mg | Freq: Once | INTRAMUSCULAR | Status: AC
  Administered 2013-08-21: 10 mg via INTRAVENOUS

## 2013-08-21 MED ORDER — SODIUM CHLORIDE 0.9 % IJ SOLN
10.0000 mL | INTRAMUSCULAR | Status: DC | PRN
  Administered 2013-08-21: 10 mL
  Filled 2013-08-21: qty 10

## 2013-08-21 MED ORDER — DEXAMETHASONE SODIUM PHOSPHATE 10 MG/ML IJ SOLN
INTRAMUSCULAR | Status: AC
  Filled 2013-08-21: qty 1

## 2013-08-21 MED ORDER — SODIUM CHLORIDE 0.9 % IV SOLN
204.2000 mg | Freq: Once | INTRAVENOUS | Status: AC
  Administered 2013-08-21: 200 mg via INTRAVENOUS
  Filled 2013-08-21: qty 20

## 2013-08-21 MED ORDER — SODIUM CHLORIDE 0.9 % IV SOLN
800.0000 mg/m2 | Freq: Once | INTRAVENOUS | Status: AC
  Administered 2013-08-21: 1330 mg via INTRAVENOUS
  Filled 2013-08-21: qty 34.98

## 2013-08-21 MED ORDER — HEPARIN SOD (PORK) LOCK FLUSH 100 UNIT/ML IV SOLN
500.0000 [IU] | Freq: Once | INTRAVENOUS | Status: AC | PRN
  Administered 2013-08-21: 500 [IU]
  Filled 2013-08-21: qty 5

## 2013-08-21 MED ORDER — ONDANSETRON 8 MG/50ML IVPB (CHCC)
8.0000 mg | Freq: Once | INTRAVENOUS | Status: AC
  Administered 2013-08-21: 8 mg via INTRAVENOUS

## 2013-08-21 NOTE — Progress Notes (Signed)
OFFICE PROGRESS NOTE  CC**  [name], MD [address] [place] [address]  DIAGNOSIS: 57 year old female with stage IIA ER weakly positive, PR negative HER-2/neu negative, invasive ductal carcinoma of the left breast.   PRIOR THERAPY: 1. Earlier this year it on routine screening mammography the patient was noted to have a abnormality within the 12:00 position of the left breast. Patient returned for additional imaging with 3-[name], tomosynthesis. Within the left breast there were 2 masses somewhat lobulated with irregular margins in the upper inner aspect of the left breast. These were separated by approximately a centimeter. On ultrasound the lesion the 11:00 position measured 1.0 x 0.7 cm. The 12:00 lesion measured 1.6 x 0.9 cm. In addition within the axillary region there was a 2.1 x 1.7 suspicious appearing lymph node.. The patient proceeded to undergo ultrasound-guided biopsy of the 2 areas within the left breast as well as the suspicious left axillary lymph node. Biopsies from both breast areas revealed invasive ductal carcinoma. In addition the lymph node from the left axilla revealed ductal carcinoma. The lesions appeared to be high-grade. There was no HER-2/neu amplification and hormone staining was minimal consistent with essentially consistent with triple negative breast cancer.  Patient's case was discussed at the multidisciplinary breast conference her pathology and radiology were reviewed.  2. Patient underwent lumpectomy of the left breast with Dr. Donell Beers on 02/04/13, a 1.6cm tumor was removed along with an axillary lymph node dissection that found 1/19 nodes positive for metastatic disease.    3. Patient was then started on adjuvant chemotherapy on 03/05/13 with Adriamycin/Cytoxan. She began Taxol Carbo on 05/07/13, this was discontinued after 9 cycles due to rash.  She started Gemzar carbo on 9/25 for two cycles.  CURRENT THERAPY:  Gemzar Carbo cycle 2 day  1  INTERVAL HISTORY: Kristi [name] 57 y.o. female returns for evaluation prior to Gemzar/Carbo.  This is her last cycle of chemotherapy and she is looking forward to finishing it.  Her numbness is stable.  She denies fevers, chills, nausea, vomiting, constipation, diarrhea, or any other concerns.   MEDICAL HISTORY: Past Medical History  Diagnosis Date  . Anxiety   . GERD (gastroesophageal reflux disease)   . Hypertension   . MRSA (methicillin resistant Staphylococcus aureus) 2009  . Depression   . Anemia     Iron deficinecy anemia  . Breast cancer     ALLERGIES:  has No Known Allergies.  MEDICATIONS:  Current Outpatient Prescriptions  Medication Sig Dispense Refill  . ALPRAZolam (XANAX) 1 MG tablet Take 2 mg by mouth at bedtime as needed for sleep.       Marland Kitchen amphetamine-dextroamphetamine (ADDERALL) 10 MG tablet Take 20 mg by mouth daily.       Marland Kitchen aspirin EC 81 MG EC tablet Take 1 tablet (81 mg total) by mouth daily.  30 tablet  0  . atorvastatin (LIPITOR) 40 MG tablet Take 1 tablet (40 mg total) by mouth daily at 6 PM.  30 tablet  1  . B Complex-C (SUPER B COMPLEX PO) Take 1 tablet by mouth daily.      . ciprofloxacin (CIPRO) 500 MG tablet Take 1 tablet (500 mg total) by mouth 2 (two) times daily.  14 tablet  0  . docusate sodium (COLACE) 100 MG capsule Take 100 mg by mouth 2 (two) times daily as needed for constipation.      Marland Kitchen escitalopram (LEXAPRO) 20 MG tablet Take 1 tablet (20  mg total) by mouth daily.  60 tablet  6  . esomeprazole (NEXIUM) 40 MG capsule Take 40 mg by mouth daily as needed. For reflux      . gabapentin (NEURONTIN) 100 MG capsule Take 1 capsule (100 mg total) by mouth 3 (three) times daily.  90 capsule  2  . lidocaine-prilocaine (EMLA) cream Apply topically as needed.  30 g  7  . Multiple Vitamin (MULTIVITAMIN) tablet Take 1 tablet by mouth daily.      . Omega-3 Fatty Acids (FISH OIL PO) Take 1 capsule by mouth daily.      Marland Kitchen OVER THE COUNTER MEDICATION Take by  mouth as needed.      . potassium chloride SA (K-DUR,KLOR-CON) 20 MEQ tablet Take 1 tablet (20 mEq total) by mouth 3 (three) times daily. For 5 days then as directed.  60 tablet  0  . triamcinolone ointment (KENALOG) 0.5 % Apply topically 2 (two) times daily.  30 g  0  . valsartan-hydrochlorothiazide (DIOVAN-HCT) 320-25 MG per tablet Take 0.5 tablets by mouth daily.       . [DISCONTINUED] prochlorperazine (COMPAZINE) 10 MG tablet Take 1 tablet (10 mg total) by mouth every 6 (six) hours as needed (Nausea or vomiting).  30 tablet  1  . [DISCONTINUED] prochlorperazine (COMPAZINE) 25 MG suppository Place 1 suppository (25 mg total) rectally every 12 (twelve) hours as needed for nausea.  12 suppository  3   No current facility-administered medications for this visit.    SURGICAL HISTORY:  Past Surgical History  Procedure Laterality Date  . Anal sphincterotomy  04/2011  . Hemorrhoid surgery  04/2011    ligation  . Breast lumpectomy with needle localization Left 02/04/2013    Procedure: LEFT BREAST LUMPECTOMY WITH NEEDLE LOCALIZATION;  Surgeon: Almond Lint, MD;  Location: [facility] OR;  Service: General;  Laterality: Left;  . Axillary lymph node dissection Left 02/04/2013    Procedure: LEFT AXILLARY LYMPH NODE DISSECTION;  Surgeon: Almond Lint, MD;  Location: [facility] OR;  Service: General;  Laterality: Left;  End: 1512  . Portacath placement Right 02/04/2013    Procedure: INSERTION PORT-A-CATH;  Surgeon: Almond Lint, MD;  Location: [facility] OR;  Service: General;  Laterality: Right;  Start Time: 4098.  Marland Kitchen Appendectomy  1980  . Breast surgery      Lumpectomy in april 2014    REVIEW OF SYSTEMS:   A 10 point review of systems was conducted and is otherwise negative except for what is noted above.    PHYSICAL EXAMINATION: Blood pressure 130/79, pulse 96, temperature 98.9 F (37.2 C), temperature source Oral, resp. rate 18, height 5\' 2"  (1.575 m), weight 142 lb 11.2 oz (64.728 kg). Body mass index is 26.09  kg/(m^2). General: Patient is a well appearing female in no acute distress HEENT: PERRLA, sclerae anicteric no conjunctival pallor, MMM Neck: supple, no palpable adenopathy Lungs: clear to auscultation bilaterally, no wheezes, rhonchi, or rales Cardiovascular: regular rate rhythm, S1, S2, no murmurs, rubs or gallops Abdomen: Soft, non-tender, non-distended, normoactive bowel sounds, no HSM Extremities: warm and well perfused, no clubbing, cyanosis, or edema,  Skin: No rashes, lesions.  Finger Nails have changed color and darkened, beginning to separate from the nail bed. Neuro: Non-focal Breasts: left breast lumpectomy site without nodularity or sign of recurrence.  Right breast, no masses, nodules, small 0.5 cm axillary nodule.   ECOG PERFORMANCE STATUS: 1 - Symptomatic but completely ambulatory  LABORATORY DATA: Lab Results  Component Value Date   WBC 8.3  08/21/2013   HGB 9.7* 08/21/2013   HCT 30.2* 08/21/2013   MCV 95.6 08/21/2013   PLT 127* 08/21/2013      Chemistry      Component Value Date/Time   NA 140 08/14/2013 1320   NA 139 07/20/2013 0557   K 3.9 08/14/2013 1320   K 3.7 07/20/2013 0557   CL 106 07/20/2013 0557   CL 100 04/23/2013 1447   CO2 29 08/14/2013 1320   CO2 26 07/20/2013 0557   BUN 9.6 08/14/2013 1320   BUN 8 07/20/2013 0557   CREATININE 0.7 08/14/2013 1320   CREATININE 0.58 07/20/2013 0557      Component Value Date/Time   CALCIUM 10.0 08/14/2013 1320   CALCIUM 9.1 07/20/2013 0557   ALKPHOS 119 08/14/2013 1320   ALKPHOS 32* 07/19/2013 0717   AST 19 08/14/2013 1320   AST 20 07/19/2013 0717   ALT 24 08/14/2013 1320   ALT 28 07/19/2013 0717   BILITOT 0.29 08/14/2013 1320   BILITOT 0.2* 07/19/2013 0717       RADIOGRAPHIC STUDIES:  No results found.  ASSESSMENT: 57 year old female with new diagnosis of   #1 multifocal invasive ductal carcinoma grade 3 ER weakly positive at 3% PR negative HER-2/neu negative with Ki-67 of 90% tumor is very aggressive. She did have a  palpable lymph node as well in the left axilla. Patient was seen in the multidisciplinary clinic for discussion of treatment options.   #2 patient underwent a lumpectomy with axillary lymph node dissection with Dr. Luz Brazen on 02/04/13. A 1.6 cm tumor was removed with 1/19 lymph nodes positive for disease.    #3 She was recommended adjuvant chemotherapy consisting of Adriamycin Cytoxan given dose dense for total of 4 cycles followed by Taxol carboplatinum weekly for 12 weeks. Rationale for this aggressive treatment was discussed with the patient since she is essentially has a triple-negative disease.  She received 4 cycles of Adriamycin/Cytoxan, and 9 cycles of Taxol/Carbo.  Due to rash likely from Taxol it was discontinued, patient then received 2 cycles of Gemzar carbo given on a q 14 day schedule.    #4 certainly patient will need post lumpectomy radiation therapy she was seen by radiation oncology. This will begin after patient has completed her chemotherapy.   #5 we discussed role of genetics, and she had an appointment with Maylon Cos on 03/06/13 the results were negative.       PLAN:  1. Doing well. Labs are stable, patient will proceed with chemotherapy today.  I did palpate a small nodule in her right axillary area, she will undergo right breast mammogram and ultrasound.    2. Patient will return tomorrow for Neulasta and in one week for labs, and evaluation of chemo toxicities.  I requested f/u with radiation oncology as well.   All questions were answered. The patient knows to call the clinic with any problems, questions or concerns. We can certainly see the patient much sooner if necessary.  I spent 25 minutes counseling the patient face to face. The total time spent in the appointment was 30 minutes.  Illa Level, NP Medical Oncology [facility] [phone] 08/21/2013, 2:20 PM

## 2013-08-21 NOTE — Telephone Encounter (Signed)
, °

## 2013-08-21 NOTE — Patient Instructions (Signed)
[facility] Discharge Instructions for Patients Receiving Chemotherapy  Today you received the following chemotherapy agents Gemzar and Carboplatin  To help prevent nausea and vomiting after your treatment, we encourage you to take your nausea medication as prescribed   If you develop nausea and vomiting that is not controlled by your nausea medication, call the clinic.   BELOW ARE SYMPTOMS THAT SHOULD BE REPORTED IMMEDIATELY:  *FEVER GREATER THAN 100.5 F  *CHILLS WITH OR WITHOUT FEVER  NAUSEA AND VOMITING THAT IS NOT CONTROLLED WITH YOUR NAUSEA MEDICATION  *UNUSUAL SHORTNESS OF BREATH  *UNUSUAL BRUISING OR BLEEDING  TENDERNESS IN MOUTH AND THROAT WITH OR WITHOUT PRESENCE OF ULCERS  *URINARY PROBLEMS  *BOWEL PROBLEMS  UNUSUAL RASH Items with * indicate a potential emergency and should be followed up as soon as possible.  Feel free to call the clinic you have any questions or concerns. The clinic phone number is [phone].    

## 2013-08-21 NOTE — Patient Instructions (Signed)
Gabapentin 100mg  2 tablets, three times per day.

## 2013-08-22 ENCOUNTER — Encounter: Payer: Self-pay | Admitting: Oncology

## 2013-08-22 ENCOUNTER — Ambulatory Visit (HOSPITAL_BASED_OUTPATIENT_CLINIC_OR_DEPARTMENT_OTHER): Payer: BC Managed Care – PPO

## 2013-08-22 ENCOUNTER — Other Ambulatory Visit: Payer: Self-pay | Admitting: Adult Health

## 2013-08-22 VITALS — BP 139/75 | HR 88 | Temp 98.5°F

## 2013-08-22 DIAGNOSIS — Z5189 Encounter for other specified aftercare: Secondary | ICD-10-CM

## 2013-08-22 DIAGNOSIS — C50212 Malignant neoplasm of upper-inner quadrant of left female breast: Secondary | ICD-10-CM

## 2013-08-22 DIAGNOSIS — C50219 Malignant neoplasm of upper-inner quadrant of unspecified female breast: Secondary | ICD-10-CM

## 2013-08-22 MED ORDER — PEGFILGRASTIM INJECTION 6 MG/0.6ML
6.0000 mg | Freq: Once | SUBCUTANEOUS | Status: AC
  Administered 2013-08-22: 6 mg via SUBCUTANEOUS
  Filled 2013-08-22: qty 0.6

## 2013-08-22 NOTE — Progress Notes (Signed)
Patient states her disability has been exhausted. Called Scott Kegler @ [place] and he has extended the patient's disability until 11/09/13.

## 2013-08-26 ENCOUNTER — Encounter: Payer: Self-pay | Admitting: Radiation Oncology

## 2013-08-26 NOTE — Progress Notes (Signed)
Location of Breast Cancer:left breast  Histology per Pathology Report:    Diagnosis 1. Breast, lumpectomy, Left - MULTIFOCAL INVASIVE GRADE III CARCINOMA, THREE FOCI MEASURING 1.6 CM, 1.3 CM AND 0.2 CM - INVASIVE CARCINOMA IS LESS THAN 0.1 CM FROM MEDIAL MARGIN ON LUMPECTOMY SPECIMEN. - OTHER MARGINS ARE NEGATIVE. - SEE ONCOLOGY TEMPLATE. 2. Breast, excision, Left, additional medial margin - BENIGN BREAST PARENCHYMA WITH FIBROCYTIC CHANGES AND FAT NECROSIS. - NO ATYPIA, HYPERPLASIA, OR MALIGNANCY IDENTIFIED. 3. Lymph nodes, regional resection, Axillary - ONE LYMPH NODE POSITIVE FOR METASTATIC DUCTAL CARCINOMA (1/1). - EIGHTEEN ADDITIONAL LYMPH NODES WITH NO TUMOR SEEN (0/18).  Receptor Status: ER(6% positive), PR (negative), Her2-neu (negative)  Did patient present with symptoms (if so, please note symptoms) or was this found on screening mammography?: routine screening mammography  Past/Anticipated interventions by surgeon, if any: 02/04/2013 LEFT BREAST LUMPECTOMY WITH NEEDLE LOCALIZATION;  Surgeon: Almond Lint, MD;  Laterality: Left; and LEFT AXILLARY LYMPH NODE DISSECTION;  Surgeon: Almond Lint, MD;  Laterality: Left  Past/Anticipated interventions by medical oncology, if any:  Patient was started on adjuvant chemotherapy on 03/05/13 with Adriamycin/Cytoxan. She began Taxol Carbo on 05/07/13, this was discontinued after 9 cycles due to rash. She started Gemzar carbo on 9/25 for two cycles.  Lymphedema issues, if any:  no    Pain issues, if any:  yes neuropathy in both hands and feet.  Soreness in left arm and shoulder.  SAFETY ISSUES:  Prior radiation? no  Pacemaker/ICD? no  Possible current pregnancy?no  Is the patient on methotrexate? no  Current Complaints / other details:  Patient here with her daughter.  11 years at first period.  18 years at birth of first child.  BCP use for 39 years.   Has 1 daughter.

## 2013-08-27 ENCOUNTER — Encounter: Payer: Self-pay | Admitting: Radiation Oncology

## 2013-08-27 ENCOUNTER — Ambulatory Visit
Admission: RE | Admit: 2013-08-27 | Discharge: 2013-08-27 | Disposition: A | Discharge status: Home / Self Care | Payer: BC Managed Care – PPO | Source: Ambulatory Visit | Attending: Radiation Oncology | Admitting: Radiation Oncology

## 2013-08-27 VITALS — BP 120/59 | HR 104 | Temp 98.3°F | Ht 62.0 in | Wt 140.6 lb

## 2013-08-27 DIAGNOSIS — Z9221 Personal history of antineoplastic chemotherapy: Secondary | ICD-10-CM | POA: Insufficient documentation

## 2013-08-27 DIAGNOSIS — C50212 Malignant neoplasm of upper-inner quadrant of left female breast: Secondary | ICD-10-CM

## 2013-08-27 DIAGNOSIS — C50919 Malignant neoplasm of unspecified site of unspecified female breast: Secondary | ICD-10-CM | POA: Insufficient documentation

## 2013-08-27 DIAGNOSIS — G609 Hereditary and idiopathic neuropathy, unspecified: Secondary | ICD-10-CM | POA: Insufficient documentation

## 2013-08-27 DIAGNOSIS — C773 Secondary and unspecified malignant neoplasm of axilla and upper limb lymph nodes: Secondary | ICD-10-CM | POA: Insufficient documentation

## 2013-08-27 NOTE — Progress Notes (Signed)
Radiation Oncology         [phone] ________________________________  Name: Kristi [name] MRN: [id number]  Date: 08/27/2013  DOB: 11/29/1955  Re-evaluation note  CC: [place], MD  Victorino December, MD, Almond Lint, MD  Diagnosis:   Multifocal invasive ductal carcinoma of the left breast (mpT1c, pN1a, pMx)  Narrative:  The patient returns today for for further evaluation. The patient was initially seen in the multidisciplinary breast clinic March 26 along with Dr. Donell Beers and Dr. Welton Flakes. The patient was able to proceed with breast conserving surgery. On 02/04/2013 the patient underwent a partial mastectomy and axillary node dissection. She was found to have multifocal invasive grade 3 carcinoma with 3 separate foci within the lumpectomy specimen. The largest tumor measured 1.6 cm. The second lesion was 1.3 cm and the third was 0.2 cm. Initial medial margins were close but additional tissue was taken intraoperatively clearing the close medial margin.  the closest final margin was posterior at 0.25 cm. A total of 19 lymph nodes were removed from the left axilla one of which showed metastasis. The tumor was weakly positive for estrogen and 6% progesterone receptor negative.  No HER-2/neu amplification.  Ki- 67 was elevated at 90%. The patient proceeded to undergo adjuvant chemotherapy beginning on 03/05/2013. Patient was treated with Adriamycin and Cytoxan. She was then switched to Taxol/carbo but this was discontinued after 9 cycles due to rash. Patient  completed her last cycle of chemotherapy last week with Gemzar and Carbo. Patient is now seen in radiation oncology to coordinate her radiation therapy as breast conserving treatment.  Patient does have some fatigue and is not feeling completely well since receiving her chemotherapy last week. She has peripheral neuropathy. She denies any pain within the left breast nipple discharge or bleeding. She denies any problems with swelling in her left  arm or hand. She has been fitted for a lymphedema sleeve but has not requiring use of this therapy at this time. She denies any new bony pain headaches dizziness or blurred vision.                             ALLERGIES:  has No Known Allergies.  Meds: Current Outpatient Prescriptions  Medication Sig Dispense Refill  . ALPRAZolam (XANAX) 1 MG tablet Take 2 mg by mouth at bedtime as needed for sleep.       Marland Kitchen amphetamine-dextroamphetamine (ADDERALL) 10 MG tablet Take 20 mg by mouth daily.       Marland Kitchen aspirin EC 81 MG EC tablet Take 1 tablet (81 mg total) by mouth daily.  30 tablet  0  . atorvastatin (LIPITOR) 40 MG tablet Take 1 tablet (40 mg total) by mouth daily at 6 PM.  30 tablet  1  . B Complex-C (SUPER B COMPLEX PO) Take 1 tablet by mouth daily.      Marland Kitchen docusate sodium (COLACE) 100 MG capsule Take 100 mg by mouth 2 (two) times daily as needed for constipation.      Marland Kitchen escitalopram (LEXAPRO) 20 MG tablet Take 1 tablet (20 mg total) by mouth daily.  60 tablet  6  . esomeprazole (NEXIUM) 40 MG capsule Take 40 mg by mouth daily as needed. For reflux      . gabapentin (NEURONTIN) 100 MG capsule Take 1 capsule (100 mg total) by mouth 3 (three) times daily.  90 capsule  2  . lidocaine-prilocaine (EMLA) cream Apply topically as needed.  30 g  7  . Multiple Vitamin (MULTIVITAMIN) tablet Take 1 tablet by mouth daily.      . Omega-3 Fatty Acids (FISH OIL PO) Take 1 capsule by mouth daily.      . potassium chloride SA (K-DUR,KLOR-CON) 20 MEQ tablet Take 1 tablet (20 mEq total) by mouth 3 (three) times daily. For 5 days then as directed.  60 tablet  0  . valsartan-hydrochlorothiazide (DIOVAN-HCT) 320-25 MG per tablet Take 0.5 tablets by mouth daily.       Marland Kitchen dexamethasone (DECADRON) 4 MG tablet TAKE 2 TABLETS BY MOUTH TWICE A DAY WITH A MEAL STARTING THE DAY AFTER CHEMOTHERAPY FOR 3 DAYS  30 tablet  1  . OVER THE COUNTER MEDICATION Take by mouth as needed.      . triamcinolone ointment (KENALOG) 0.5 % Apply  topically 2 (two) times daily.  30 g  0  . [DISCONTINUED] prochlorperazine (COMPAZINE) 10 MG tablet Take 1 tablet (10 mg total) by mouth every 6 (six) hours as needed (Nausea or vomiting).  30 tablet  1  . [DISCONTINUED] prochlorperazine (COMPAZINE) 25 MG suppository Place 1 suppository (25 mg total) rectally every 12 (twelve) hours as needed for nausea.  12 suppository  3   No current facility-administered medications for this encounter.    Physical Findings: The patient is in no acute distress. Patient is alert and oriented.  height is 5\' 2"  (1.575 m) and weight is 140 lb 9.6 oz (63.776 kg). Her temperature is 98.3 F (36.8 C). Her blood pressure is 120/59 and her pulse is 104. Marland Kitchen No palpable supraclavicular or axillary adenopathy. The lungs are clear to auscultation. The heart has a regular rhythm and rate. Examination of the right breast reveals to be large and pendulous without mass or nipple discharge. There is a Port-A-Cath present in the right upper chest. Examination of the left breast reveals a barely detectable scar in the upper aspect of the breast. She has a very faint scar in the left axillary region from her axillary dissection. There is no dominant mass appreciated in the breast,  nipple discharge or bleeding.  Lab Findings: Lab Results  Component Value Date   WBC 8.3 08/21/2013   HGB 9.7* 08/21/2013   HCT 30.2* 08/21/2013   MCV 95.6 08/21/2013   PLT 127* 08/21/2013      Radiographic Findings: No results found.  Impression:  Multifocal invasive ductal carcinoma of the left breast (mpT1c, pN1a, pMx).  The patient would be a good candidate for breast conservation with radiation therapy directed at the left breast. I discussed with patient that she may have a increased risk for local recurrence given the fact that she has multifocal disease, however she would be a candidate for breast conserving therapy. I also discussed two general options for radiation therapy.   One directed  at the left breast only and the second option to consider coverage of the high axilla and supraclavicular region along with breast radiation treatments, given the fact that she did have axillary involvement with her axillary dissection. Patient does wish to be aggressive with her management and wishes coverage of the high axilla and supraclavicular region with her breast radiation treatments. She does understand that she will be slightly increased risk for lymphedema given this coverage but is willing to accept this risk.   Plan:   Simulation and planning next week with treatments to begin approximately 3 weeks post chemotherapy completion.   _____________________________________  -----------------------------------  [name], PhD,  MD   

## 2013-08-27 NOTE — Progress Notes (Signed)
Please see the Nurse Progress Note in the MD Initial Consult Encounter for this patient. 

## 2013-08-28 ENCOUNTER — Other Ambulatory Visit: Payer: Self-pay | Admitting: Emergency Medicine

## 2013-08-28 ENCOUNTER — Telehealth: Payer: Self-pay | Admitting: Oncology

## 2013-08-28 ENCOUNTER — Ambulatory Visit (HOSPITAL_BASED_OUTPATIENT_CLINIC_OR_DEPARTMENT_OTHER): Payer: BC Managed Care – PPO | Admitting: Oncology

## 2013-08-28 ENCOUNTER — Other Ambulatory Visit (HOSPITAL_BASED_OUTPATIENT_CLINIC_OR_DEPARTMENT_OTHER): Payer: BC Managed Care – PPO | Admitting: Lab

## 2013-08-28 VITALS — BP 121/79 | HR 101 | Temp 98.7°F | Resp 18 | Ht 62.0 in | Wt 139.0 lb

## 2013-08-28 DIAGNOSIS — Z171 Estrogen receptor negative status [ER-]: Secondary | ICD-10-CM

## 2013-08-28 DIAGNOSIS — C50919 Malignant neoplasm of unspecified site of unspecified female breast: Secondary | ICD-10-CM

## 2013-08-28 DIAGNOSIS — C773 Secondary and unspecified malignant neoplasm of axilla and upper limb lymph nodes: Secondary | ICD-10-CM

## 2013-08-28 DIAGNOSIS — C50219 Malignant neoplasm of upper-inner quadrant of unspecified female breast: Secondary | ICD-10-CM

## 2013-08-28 DIAGNOSIS — C50912 Malignant neoplasm of unspecified site of left female breast: Secondary | ICD-10-CM

## 2013-08-28 LAB — CBC WITH DIFFERENTIAL/PLATELET
BASO%: 0.1 % (ref 0.0–2.0)
Basophils Absolute: 0 10*3/uL (ref 0.0–0.1)
EOS%: 0.1 % (ref 0.0–7.0)
HCT: 30.1 % — ABNORMAL LOW (ref 34.8–46.6)
HGB: 9.5 g/dL — ABNORMAL LOW (ref 11.6–15.9)
MCH: 30.6 pg (ref 25.1–34.0)
MCHC: 31.7 g/dL (ref 31.5–36.0)
MONO#: 1.2 10*3/uL — ABNORMAL HIGH (ref 0.1–0.9)
NEUT#: 14 10*3/uL — ABNORMAL HIGH (ref 1.5–6.5)
RBC: 3.11 10*6/uL — ABNORMAL LOW (ref 3.70–5.45)
RDW: 17.9 % — ABNORMAL HIGH (ref 11.2–14.5)
WBC: 17.8 10*3/uL — ABNORMAL HIGH (ref 3.9–10.3)
lymph#: 2.5 10*3/uL (ref 0.9–3.3)

## 2013-08-28 LAB — COMPREHENSIVE METABOLIC PANEL (CC13)
ALT: 23 U/L (ref 0–55)
AST: 18 U/L (ref 5–34)
Albumin: 3.7 g/dL (ref 3.5–5.0)
Alkaline Phosphatase: 125 U/L (ref 40–150)
CO2: 26 mEq/L (ref 22–29)
Calcium: 9.9 mg/dL (ref 8.4–10.4)
Chloride: 105 mEq/L (ref 98–109)
Potassium: 3.5 mEq/L (ref 3.5–5.1)

## 2013-08-28 NOTE — Progress Notes (Signed)
OFFICE PROGRESS NOTE  CC**  [name],[name], MD [address], [address] [place] [address]  DIAGNOSIS: 57 year old female with stage IIA ER weakly positive, PR negative HER-2/neu negative, invasive ductal carcinoma of the left breast.   PRIOR THERAPY: 1. Earlier this year it on routine screening mammography the patient was noted to have a abnormality within the 12:00 position of the left breast. Patient returned for additional imaging with 3-D, tomosynthesis. Within the left breast there were 2 masses somewhat lobulated with irregular margins in the upper inner aspect of the left breast. These were separated by approximately a centimeter. On ultrasound the lesion the 11:00 position measured 1.0 x 0.7 cm. The 12:00 lesion measured 1.6 x 0.9 cm. In addition within the axillary region there was a 2.1 x 1.7 suspicious appearing lymph node.. The patient proceeded to undergo ultrasound-guided biopsy of the 2 areas within the left breast as well as the suspicious left axillary lymph node. Biopsies from both breast areas revealed invasive ductal carcinoma. In addition the lymph node from the left axilla revealed ductal carcinoma. The lesions appeared to be high-grade. There was no HER-2/neu amplification and hormone staining was minimal consistent with essentially consistent with triple negative breast cancer.  Patient's case was discussed at the multidisciplinary breast conference her pathology and radiology were reviewed.  2. Patient underwent lumpectomy of the left breast with Dr. Donell Beers on 02/04/13, a 1.6cm tumor was removed along with an axillary lymph node dissection that found 1/19 nodes positive for metastatic disease.    3. Patient was then started on adjuvant chemotherapy on 03/05/13 with Adriamycin/Cytoxan. She began Taxol Carbo on 05/07/13, this was discontinued after 9 cycles due to rash.  She started Gemzar carbo on 9/25  - 10/16. X2 cycles  CURRENT THERAPY:  Proceed with  radiation  INTERVAL HISTORY: Kristi [name] 57 y.o. female returns for followup visit after receiving her last cycle of Gemzar carboplatinum on 08/21/2013. Her blood count looks better. She is tired weak fatigued. She is having some visual changes this may be due to the chemotherapy. If not improved then she will need to see an ophthalmologist. She has no nausea or vomiting no myalgias and arthralgias. Patient certainly is tearful but these are tears enjoy for having gone through everything that she has had completing it. She has, strong. She denies any headaches.no difficulty with ambulation. She still has tingling and numbness in her hands and feet. I have encouraged her to continue taking the gabapentin she is taking it 200 mg 3 times a day. If her neuropathy doesn't improve then we will plan on increasing the dose even further. Remainder of the 10 point of systems is negative. MEDICAL HISTORY: Past Medical History  Diagnosis Date  . Anxiety   . GERD (gastroesophageal reflux disease)   . Hypertension   . MRSA (methicillin resistant Staphylococcus aureus) 2009  . Depression   . Anemia     Iron deficinecy anemia  . Breast cancer     left    ALLERGIES:  has No Known Allergies.  MEDICATIONS:  Current Outpatient Prescriptions  Medication Sig Dispense Refill  . ALPRAZolam (XANAX) 1 MG tablet Take 2 mg by mouth at bedtime as needed for sleep.       Marland Kitchen amphetamine-dextroamphetamine (ADDERALL) 10 MG tablet Take 20 mg by mouth daily.       Marland Kitchen aspirin EC 81 MG EC tablet Take 1 tablet (81 mg total) by mouth daily.  30 tablet  0  . atorvastatin (LIPITOR) 40 MG tablet Take 1 tablet (40 mg total) by mouth daily at 6 PM.  30 tablet  1  . B Complex-C (SUPER B COMPLEX PO) Take 1 tablet by mouth daily.      Marland Kitchen docusate sodium (COLACE) 100 MG capsule Take 100 mg by mouth 2 (two) times daily as needed for constipation.      Marland Kitchen escitalopram (LEXAPRO) 20 MG tablet Take 1 tablet (20 mg total) by mouth daily.  60  tablet  6  . esomeprazole (NEXIUM) 40 MG capsule Take 40 mg by mouth daily as needed. For reflux      . gabapentin (NEURONTIN) 100 MG capsule Take 1 capsule (100 mg total) by mouth 3 (three) times daily.  90 capsule  2  . lidocaine-prilocaine (EMLA) cream Apply topically as needed.  30 g  7  . Multiple Vitamin (MULTIVITAMIN) tablet Take 1 tablet by mouth daily.      . Omega-3 Fatty Acids (FISH OIL PO) Take 1 capsule by mouth daily.      Marland Kitchen OVER THE COUNTER MEDICATION Take by mouth as needed.      . valsartan-hydrochlorothiazide (DIOVAN-HCT) 320-25 MG per tablet Take 0.5 tablets by mouth daily.       . potassium chloride SA (K-DUR,KLOR-CON) 20 MEQ tablet Take 1 tablet (20 mEq total) by mouth 3 (three) times daily. For 5 days then as directed.  60 tablet  0  . [DISCONTINUED] prochlorperazine (COMPAZINE) 10 MG tablet Take 1 tablet (10 mg total) by mouth every 6 (six) hours as needed (Nausea or vomiting).  30 tablet  1  . [DISCONTINUED] prochlorperazine (COMPAZINE) 25 MG suppository Place 1 suppository (25 mg total) rectally every 12 (twelve) hours as needed for nausea.  12 suppository  3   No current facility-administered medications for this visit.    SURGICAL HISTORY:  Past Surgical History  Procedure Laterality Date  . Anal sphincterotomy  04/2011  . Hemorrhoid surgery  04/2011    ligation  . Breast lumpectomy with needle localization Left 02/04/2013    Procedure: LEFT BREAST LUMPECTOMY WITH NEEDLE LOCALIZATION;  Surgeon: Almond Lint, MD;  Location: [facility] OR;  Service: General;  Laterality: Left;  . Axillary lymph node dissection Left 02/04/2013    Procedure: LEFT AXILLARY LYMPH NODE DISSECTION;  Surgeon: Almond Lint, MD;  Location: [facility] OR;  Service: General;  Laterality: Left;  End: 1512  . Portacath placement Right 02/04/2013    Procedure: INSERTION PORT-A-CATH;  Surgeon: Almond Lint, MD;  Location: [facility] OR;  Service: General;  Laterality: Right;  Start Time: 1610.  Marland Kitchen Appendectomy  1980  . Breast  surgery      Lumpectomy in april 2014    REVIEW OF SYSTEMS:   A 10 point review of systems was conducted and is otherwise negative except for what is noted above.    PHYSICAL EXAMINATION: Blood pressure 121/79, pulse 101, temperature 98.7 F (37.1 C), temperature source Oral, resp. rate 18, height 5\' 2"  (1.575 m), weight 139 lb (63.05 kg). Body mass index is 25.42 kg/(m^2). General: Patient is a well appearing female in no acute distress HEENT: PERRLA, sclerae anicteric no conjunctival pallor, MMM Neck: supple, no palpable adenopathy Lungs: clear to auscultation bilaterally, no wheezes, rhonchi, or rales Cardiovascular: regular rate rhythm, S1, S2, no murmurs, rubs or gallops Abdomen: Soft, non-tender, non-distended, normoactive bowel sounds, no HSM Extremities: warm and well perfused, no clubbing, cyanosis, or edema,  Skin: No rashes, lesions.  Finger Nails have  changed color and darkened, beginning to separate from the nail bed. Neuro: Non-focal Breasts: left breast lumpectomy site without nodularity or sign of recurrence.  Right breast, no masses, nodules, small 0.5 cm axillary nodule.   ECOG PERFORMANCE STATUS: 1 - Symptomatic but completely ambulatory  LABORATORY DATA: Lab Results  Component Value Date   WBC 17.8* 08/28/2013   HGB 9.5* 08/28/2013   HCT 30.1* 08/28/2013   MCV 96.8 08/28/2013   PLT 154 08/28/2013      Chemistry      Component Value Date/Time   NA 140 08/28/2013 1409   NA 139 07/20/2013 0557   K 3.5 08/28/2013 1409   K 3.7 07/20/2013 0557   CL 106 07/20/2013 0557   CL 100 04/23/2013 1447   CO2 26 08/28/2013 1409   CO2 26 07/20/2013 0557   BUN 9.2 08/28/2013 1409   BUN 8 07/20/2013 0557   CREATININE 0.8 08/28/2013 1409   CREATININE 0.58 07/20/2013 0557      Component Value Date/Time   CALCIUM 9.9 08/28/2013 1409   CALCIUM 9.1 07/20/2013 0557   ALKPHOS 125 08/28/2013 1409   ALKPHOS 32* 07/19/2013 0717   AST 18 08/28/2013 1409   AST 20 07/19/2013 0717    ALT 23 08/28/2013 1409   ALT 28 07/19/2013 0717   BILITOT 0.42 08/28/2013 1409   BILITOT 0.2* 07/19/2013 0717       RADIOGRAPHIC STUDIES:  No results found.  ASSESSMENT: 57 year old female with new diagnosis of   #1 multifocal invasive ductal carcinoma grade 3 ER weakly positive at 3% PR negative HER-2/neu negative with Ki-67 of 90% tumor is very aggressive. She did have a palpable lymph node as well in the left axilla. Patient was seen in the multidisciplinary clinic for discussion of treatment options.   #2 patient underwent a lumpectomy with axillary lymph node dissection with Dr. Luz Brazen on 02/04/13. A 1.6 cm tumor was removed with 1/19 lymph nodes positive for disease.    #3 She was recommended adjuvant chemotherapy consisting of Adriamycin Cytoxan given dose dense for total of 4 cycles followed by Taxol carboplatinum weekly for 12 weeks. Rationale for this aggressive treatment was discussed with the patient since she is essentially has a triple-negative disease.  She received 4 cycles of Adriamycin/Cytoxan, and 9 cycles of Taxol/Carbo.  Due to rash likely from Taxol it was discontinued, patient then received 2 cycles of Gemzar carbo given on a q 14 day schedule. Patient completed all of her chemotherapy on 08/21/2013   #4 certainly patient will need post lumpectomy radiation therapy she was seen by radiation oncology. This will begin after patient has completed her chemotherapy.   #5 we discussed role of genetics, and she had an appointment with Maylon Cos on 03/06/13 the results were negative.      #6 neuropathy on gabapentin     PLAN:  1. proceed with radiation therapy.  #2 I will see the patient back in about 7 weeks time for followup  All questions were answered. The patient knows to call the clinic with any problems, questions or concerns. We can certainly see the patient much sooner if necessary.  I spent 25 minutes counseling the patient face to face. The total time spent  in the appointment was 30 minutes.   Drue Second, MD Medical/Oncology [facility] [phone] (beeper) [phone] (Office)  08/28/2013, 3:29 PM

## 2013-08-28 NOTE — Telephone Encounter (Signed)
, °

## 2013-08-28 NOTE — Patient Instructions (Signed)
Congratulations!!!!!  Proceed with radiation therapy  We will see you back in 7 weeks

## 2013-09-01 ENCOUNTER — Ambulatory Visit
Admission: RE | Admit: 2013-09-01 | Discharge: 2013-09-01 | Disposition: A | Discharge status: Home / Self Care | Payer: BC Managed Care – PPO | Source: Ambulatory Visit | Attending: Radiation Oncology | Admitting: Radiation Oncology

## 2013-09-01 DIAGNOSIS — Z79899 Other long term (current) drug therapy: Secondary | ICD-10-CM | POA: Insufficient documentation

## 2013-09-01 DIAGNOSIS — C50212 Malignant neoplasm of upper-inner quadrant of left female breast: Secondary | ICD-10-CM

## 2013-09-01 DIAGNOSIS — Z510 Encounter for antineoplastic radiation therapy: Secondary | ICD-10-CM | POA: Insufficient documentation

## 2013-09-01 DIAGNOSIS — C50219 Malignant neoplasm of upper-inner quadrant of unspecified female breast: Secondary | ICD-10-CM | POA: Insufficient documentation

## 2013-09-01 DIAGNOSIS — L539 Erythematous condition, unspecified: Secondary | ICD-10-CM | POA: Insufficient documentation

## 2013-09-02 NOTE — Progress Notes (Signed)
  Radiation Oncology         (336) [phone] ________________________________  Name: Kristi [name] MRN: [id number]  Date: 09/01/2013  DOB: 04-10-1956  SIMULATION AND TREATMENT PLANNING NOTE  DIAGNOSIS:  Multifocal invasive ductal carcinoma of the left breast (mpT1c, pN1a, pMx)   NARRATIVE:  The patient was brought to the CT Simulation planning suite.  Identity was confirmed.  All relevant records and images related to the planned course of therapy were reviewed.  The patient freely provided informed written consent to proceed with treatment after reviewing the details related to the planned course of therapy. The consent form was witnessed and verified by the simulation staff.  Then, the patient was set-up in a stable reproducible  supine position for radiation therapy.  CT images were obtained.  Surface markings were placed.  The CT images were loaded into the planning software.  Then the target and avoidance structures were contoured.  Treatment planning then occurred.  The radiation prescription was entered and confirmed.  Then, I designed and supervised the construction of a total of 4 medically necessary complex treatment devices.  I have requested : 3D Simulation  I have requested a DVH of the following structures: heart, lungs, lumpectomy cavity.  I have ordered:dose calc.  PLAN:  The patient will receive 45 Gy in 25 fractions with a 3 field set up, followed by a boost to the lumpectomy cavity to a cumulative dose of 63 gray.  ________________________________  -----------------------------------  [name], PhD, MD

## 2013-09-05 ENCOUNTER — Encounter: Payer: Self-pay | Admitting: *Deleted

## 2013-09-05 NOTE — Progress Notes (Signed)
[facility] Psychosocial Distress Screening Clinical Social Work  Clinical Social Work was referred by distress screening protocol.  The patient scored a 5 on the Psychosocial Distress Thermometer which indicates moderate distress. Clinical Social Worker phoned Pt at home to assess for distress and other psychosocial needs. Pt not home, CSW left VM to call CSW as needed.    Clinical Social Worker follow up needed: no  Doreen Salvage, LCSW Clinical Social Worker [facility] Wednesday, Thursday and Friday Phone: [phone] Fax: [phone]

## 2013-09-08 ENCOUNTER — Ambulatory Visit
Admission: RE | Admit: 2013-09-08 | Discharge: 2013-09-08 | Disposition: A | Discharge status: Home / Self Care | Payer: BC Managed Care – PPO | Source: Ambulatory Visit | Attending: Radiation Oncology | Admitting: Radiation Oncology

## 2013-09-08 DIAGNOSIS — C50212 Malignant neoplasm of upper-inner quadrant of left female breast: Secondary | ICD-10-CM

## 2013-09-08 NOTE — Progress Notes (Signed)
  Radiation Oncology         [phone]) [phone] ________________________________  Name: NETANYA YAZDANI MRN: [id number]  Date: 09/08/2013  DOB: [birth year]  Simulation Verification Note  Status: outpatient  NARRATIVE: The patient was brought to the treatment unit and placed in the planned treatment position. The clinical setup was verified. Then port films were obtained and uploaded to the radiation oncology medical record software.  The treatment beams were carefully compared against the planned radiation fields. The position location and shape of the radiation fields was reviewed. They targeted volume of tissue appears to be appropriately covered by the radiation beams. Organs at risk appear to be excluded as planned.  Based on my personal review, I approved the simulation verification. The patient's treatment will proceed as planned.  -----------------------------------  [name], PhD, MD

## 2013-09-09 ENCOUNTER — Ambulatory Visit
Admission: RE | Admit: 2013-09-09 | Discharge: 2013-09-09 | Disposition: A | Discharge status: Home / Self Care | Payer: BC Managed Care – PPO | Source: Ambulatory Visit | Attending: Radiation Oncology | Admitting: Radiation Oncology

## 2013-09-09 ENCOUNTER — Encounter: Payer: Self-pay | Admitting: Radiation Oncology

## 2013-09-09 ENCOUNTER — Other Ambulatory Visit: Payer: Self-pay | Admitting: Adult Health

## 2013-09-09 VITALS — BP 119/68 | HR 88 | Temp 99.0°F | Resp 20 | Wt 138.7 lb

## 2013-09-09 DIAGNOSIS — C50212 Malignant neoplasm of upper-inner quadrant of left female breast: Secondary | ICD-10-CM

## 2013-09-09 MED ORDER — RADIAPLEXRX EX GEL
Freq: Once | CUTANEOUS | Status: AC
  Administered 2013-09-09: 14:00:00 via TOPICAL

## 2013-09-09 MED ORDER — ALRA NON-METALLIC DEODORANT (RAD-ONC)
1.0000 "application " | Freq: Once | TOPICAL | Status: AC
  Administered 2013-09-09: 1 via TOPICAL

## 2013-09-09 NOTE — Progress Notes (Signed)
[facility]    Radiation Oncology [address] [place]     [name], M.D. [place], [place] [address]               [name], M.D., Ph.D. Phone: [phone]      [name], M.D. Fax: [phone]      [name], M.D., Ph.D.         [name], M.D.         [name], M.D Weekly Treatment Management Note  Name: Kristi [name]     MRN: [id number]        CSN: [id number] Date: 09/09/2013      DOB: 1956/09/08  CC: Kristi Nickerson, MD         [name]    Status: Outpatient  Diagnosis: The encounter diagnosis was Cancer of upper-inner quadrant of female breast, left.  Current Dose: 1.8 Gy  Current Fraction: 1  Planned Dose: 63 Gy  Narrative: Kristi [name] was [name] today for weekly treatment management. The chart was checked and port films  were reviewed. Tolerating her radiation treatment well without side effects this time.  Review of patient's allergies indicates no known allergies.  Current Outpatient Prescriptions  Medication Sig Dispense Refill  . ALPRAZolam (XANAX) 1 MG tablet Take 2 mg by mouth at bedtime as needed for sleep.       Marland Kitchen amphetamine-dextroamphetamine (ADDERALL) 10 MG tablet Take 20 mg by mouth daily.       Marland Kitchen aspirin EC 81 MG EC tablet Take 1 tablet (81 mg total) by mouth daily.  30 tablet  0  . atorvastatin (LIPITOR) 40 MG tablet Take 1 tablet (40 mg total) by mouth daily at 6 PM.  30 tablet  1  . B Complex-C (SUPER B COMPLEX PO) Take 1 tablet by mouth daily.      Marland Kitchen docusate sodium (COLACE) 100 MG capsule Take 100 mg by mouth 2 (two) times daily as needed for constipation.      Marland Kitchen escitalopram (LEXAPRO) 20 MG tablet Take 1 tablet (20 mg total) by mouth daily.  60 tablet  6  . esomeprazole (NEXIUM) 40 MG capsule Take 40 mg by mouth daily as needed. For reflux      . gabapentin (NEURONTIN) 100 MG capsule TAKE 1 CAPSULE (100 MG TOTAL) BY MOUTH 3 (THREE) TIMES DAILY.  90 capsule  2  . lidocaine-prilocaine (EMLA) cream  Apply topically as needed.  30 g  7  . Multiple Vitamin (MULTIVITAMIN) tablet Take 1 tablet by mouth daily.      . Omega-3 Fatty Acids (FISH OIL PO) Take 1 capsule by mouth daily.      Marland Kitchen OVER THE COUNTER MEDICATION Take by mouth as needed.      . valsartan-hydrochlorothiazide (DIOVAN-HCT) 320-25 MG per tablet Take 0.5 tablets by mouth daily.       . potassium chloride SA (K-DUR,KLOR-CON) 20 MEQ tablet Take 1 tablet (20 mEq total) by mouth 3 (three) times daily. For 5 days then as directed.  60 tablet  0  . [DISCONTINUED] prochlorperazine (COMPAZINE) 10 MG tablet Take 1 tablet (10 mg total) by mouth every 6 (six) hours as needed (Nausea or vomiting).  30 tablet  1  . [DISCONTINUED] prochlorperazine (COMPAZINE) 25 MG suppository Place 1 suppository (25 mg total) rectally every 12 (twelve) hours as needed for nausea.  12 suppository  3   No current facility-administered medications for this encounter.   Labs:  Lab Results  Component Value Date   WBC 17.8* 08/28/2013   HGB 9.5* 08/28/2013   HCT 30.1* 08/28/2013   MCV 96.8 08/28/2013   PLT 154 08/28/2013   Lab Results  Component Value Date   CREATININE 0.8 08/28/2013   BUN 9.2 08/28/2013   NA 140 08/28/2013   K 3.5 08/28/2013   CL 106 07/20/2013   CO2 26 08/28/2013   Lab Results  Component Value Date   ALT 23 08/28/2013   AST 18 08/28/2013   BILITOT 0.42 08/28/2013    Physical Examination:  weight is 138 lb 11.2 oz (62.914 kg). Her oral temperature is 99 F (37.2 C). Her blood pressure is 119/68 and her pulse is 88. Her respiration is 20.    Wt Readings from Last 3 Encounters:  09/09/13 138 lb 11.2 oz (62.914 kg)  08/28/13 139 lb (63.05 kg)  08/27/13 140 lb 9.6 oz (63.776 kg)    The left breast area shows no appreciable skin reaction. Lungs - Normal respiratory effort, chest expands symmetrically. Lungs are clear to auscultation, no crackles or wheezes.  Heart has regular rhythm and rate  Abdomen is soft and non tender with  normal bowel sounds  Assessment:  Patient tolerating treatments well  Plan: Continue treatment per original radiation prescription

## 2013-09-09 NOTE — Progress Notes (Signed)
Weekly rad tx  Lt breast, 1st tx completed, patient education done, discusses fatigue,skin irritation, pain, diet,increase protein, alra and radiaplex gel given with radiation book  Will apply after rad txs daily and in am  But not 4 hours prior to coming dor tx,teach back, no c/o pain,  1:36 PM

## 2013-09-10 ENCOUNTER — Ambulatory Visit
Admission: RE | Admit: 2013-09-10 | Discharge: 2013-09-10 | Disposition: A | Discharge status: Home / Self Care | Payer: BC Managed Care – PPO | Source: Ambulatory Visit | Attending: Radiation Oncology | Admitting: Radiation Oncology

## 2013-09-11 ENCOUNTER — Other Ambulatory Visit: Payer: Self-pay | Admitting: Emergency Medicine

## 2013-09-11 ENCOUNTER — Ambulatory Visit
Admission: RE | Admit: 2013-09-11 | Discharge: 2013-09-11 | Disposition: A | Discharge status: Home / Self Care | Payer: BC Managed Care – PPO | Source: Ambulatory Visit | Attending: Radiation Oncology | Admitting: Radiation Oncology

## 2013-09-11 MED ORDER — GABAPENTIN 100 MG PO CAPS: 200.0000 mg | ORAL_CAPSULE | Freq: Three times a day (TID) | ORAL | Status: DC

## 2013-09-12 ENCOUNTER — Ambulatory Visit
Admission: RE | Admit: 2013-09-12 | Discharge: 2013-09-12 | Disposition: A | Discharge status: Home / Self Care | Payer: BC Managed Care – PPO | Source: Ambulatory Visit | Attending: Radiation Oncology | Admitting: Radiation Oncology

## 2013-09-15 ENCOUNTER — Ambulatory Visit
Admission: RE | Admit: 2013-09-15 | Discharge: 2013-09-15 | Disposition: A | Discharge status: Home / Self Care | Payer: BC Managed Care – PPO | Source: Ambulatory Visit | Attending: Radiation Oncology | Admitting: Radiation Oncology

## 2013-09-16 ENCOUNTER — Ambulatory Visit
Admission: RE | Admit: 2013-09-16 | Discharge: 2013-09-16 | Disposition: A | Discharge status: Home / Self Care | Payer: BC Managed Care – PPO | Source: Ambulatory Visit | Attending: Radiation Oncology | Admitting: Radiation Oncology

## 2013-09-16 VITALS — BP 111/67 | HR 71 | Temp 98.0°F | Ht 62.0 in | Wt 142.2 lb

## 2013-09-16 DIAGNOSIS — C50212 Malignant neoplasm of upper-inner quadrant of left female breast: Secondary | ICD-10-CM

## 2013-09-16 NOTE — Progress Notes (Signed)
Kristi [name] here with her husband for weekly under treat visit.  She had had 6 fractions to her left breast.  She has mild pain in her upper back due to the treatment tablet that she is rating at a 1/10.  She is slightly fatigued.  The skin on her left breast is intact.  She is using radiaplex gel.

## 2013-09-16 NOTE — Progress Notes (Signed)
  Radiation Oncology         (336) [phone] ________________________________  Name: Kristi [name] MRN: [id number]  Date: 09/16/2013  DOB: 12-Oct-1956  Weekly Radiation Therapy Management  Current Dose: 10.8 Gy     Planned Dose:  63 Gy  Narrative . . . . . . . . The patient presents for routine under treatment assessment.                                   The patient is without complaint.                                 Set-up films were reviewed.                                 The chart was checked. Physical Findings. . .  height is 5\' 2"  (1.575 m) and weight is 142 lb 3.2 oz (64.501 kg). Her temperature is 98 F (36.7 C). Her blood pressure is 111/67 and her pulse is 71. . Weight essentially stable.  No significant changes. Slight hyperpigmentation of the left breast Impression . . . . . . . The patient is tolerating radiation. Plan . . . . . . . . . . . . Continue treatment as planned.  ________________________________  -----------------------------------  [name], PhD, MD

## 2013-09-17 ENCOUNTER — Ambulatory Visit
Admission: RE | Admit: 2013-09-17 | Discharge: 2013-09-17 | Disposition: A | Discharge status: Home / Self Care | Payer: BC Managed Care – PPO | Source: Ambulatory Visit | Attending: Radiation Oncology | Admitting: Radiation Oncology

## 2013-09-18 ENCOUNTER — Ambulatory Visit
Admission: RE | Admit: 2013-09-18 | Discharge: 2013-09-18 | Disposition: A | Discharge status: Home / Self Care | Payer: BC Managed Care – PPO | Source: Ambulatory Visit | Attending: Radiation Oncology | Admitting: Radiation Oncology

## 2013-09-18 ENCOUNTER — Ambulatory Visit (HOSPITAL_BASED_OUTPATIENT_CLINIC_OR_DEPARTMENT_OTHER): Payer: BC Managed Care – PPO | Admitting: Adult Health

## 2013-09-18 ENCOUNTER — Telehealth: Payer: Self-pay | Admitting: Oncology

## 2013-09-18 ENCOUNTER — Encounter: Payer: Self-pay | Admitting: Adult Health

## 2013-09-18 VITALS — BP 129/83 | HR 87 | Temp 98.7°F | Resp 18 | Ht 62.0 in | Wt 142.4 lb

## 2013-09-18 DIAGNOSIS — C50212 Malignant neoplasm of upper-inner quadrant of left female breast: Secondary | ICD-10-CM

## 2013-09-18 DIAGNOSIS — C50919 Malignant neoplasm of unspecified site of unspecified female breast: Secondary | ICD-10-CM

## 2013-09-18 DIAGNOSIS — Z170 Estrogen receptor positive status [ER+]: Secondary | ICD-10-CM

## 2013-09-18 DIAGNOSIS — G589 Mononeuropathy, unspecified: Secondary | ICD-10-CM

## 2013-09-18 DIAGNOSIS — C50219 Malignant neoplasm of upper-inner quadrant of unspecified female breast: Secondary | ICD-10-CM

## 2013-09-18 NOTE — Progress Notes (Signed)
OFFICE PROGRESS NOTE  CC**  [name],Kristi [name] [address] [place] [address]  DIAGNOSIS: 57 year old female with stage IIA ER weakly positive, PR negative HER-2/neu negative, invasive ductal carcinoma of the left breast.   PRIOR THERAPY: 1. Earlier this year it on routine screening mammography the patient was noted to have a abnormality within the 12:00 position of the left breast. Patient returned for additional imaging with 3-[name], tomosynthesis. Within the left breast there were 2 masses somewhat lobulated with irregular margins in the upper inner aspect of the left breast. These were separated by approximately a centimeter. On ultrasound the lesion the 11:00 position measured 1.0 x 0.7 cm. The 12:00 lesion measured 1.6 x 0.9 cm. In addition within the axillary region there was a 2.1 x 1.7 suspicious appearing lymph node.. The patient proceeded to undergo ultrasound-guided biopsy of the 2 areas within the left breast as well as the suspicious left axillary lymph node. Biopsies from both breast areas revealed invasive ductal carcinoma. In addition the lymph node from the left axilla revealed ductal carcinoma. The lesions appeared to be high-grade. There was no HER-2/neu amplification and hormone staining was minimal consistent with essentially consistent with triple negative breast cancer.  Patient's case was discussed at the multidisciplinary breast conference her pathology and radiology were reviewed.  2. Patient underwent lumpectomy of the left breast with Dr. Donell [name] on 02/04/13, a 1.6cm tumor was removed along with an axillary lymph node dissection that found 1/19 nodes positive for metastatic disease.    3. Patient was then started on adjuvant chemotherapy on 03/05/13 with Adriamycin/Cytoxan. She began Taxol Carbo on 05/07/13, this was discontinued after 9 cycles due to rash.  She received 2 cycles of Gemzar carbo from 9/25  - 10/16.   4.  Patient began adjuvant radiation on  09/09/13  CURRENT THERAPY:  Radiation therapy.    INTERVAL HISTORY: Kristi [name] 57 y.o. female returns for an evaluation of her foot pain.  The soles of her feet feel like they have shriveled up, her toes have been quivering lately as well.  This became worse and more obvious following chemotherapy.  At night the pain is worse.  Otherwise, she is doing well and a 10 point ROS is negative.    MEDICAL HISTORY: Past Medical History  Diagnosis Date  . Anxiety   . GERD (gastroesophageal reflux disease)   . Hypertension   . MRSA (methicillin resistant Staphylococcus aureus) 2009  . Depression   . Anemia     Iron deficinecy anemia  . Breast cancer     left    ALLERGIES:  has No Known Allergies.  MEDICATIONS:  Current Outpatient Prescriptions  Medication Sig Dispense Refill  . ALPRAZolam (XANAX) 1 MG tablet Take 2 mg by mouth at bedtime as needed for sleep.       Marland Kitchen amphetamine-dextroamphetamine (ADDERALL) 10 MG tablet Take 20 mg by mouth daily.       Marland Kitchen aspirin EC 81 MG EC tablet Take 1 tablet (81 mg total) by mouth daily.  30 tablet  0  . B Complex-C (SUPER B COMPLEX PO) Take 1 tablet by mouth daily.      Marland Kitchen docusate sodium (COLACE) 100 MG capsule Take 100 mg by mouth 2 (two) times daily as needed for constipation.      Marland Kitchen escitalopram (LEXAPRO) 20 MG tablet Take 1 tablet (20 mg total) by mouth daily.  60 tablet  6  . gabapentin (NEURONTIN) 100  MG capsule Take 2 capsules (200 mg total) by mouth 3 (three) times daily.  180 capsule  2  . hyaluronate sodium (RADIAPLEXRX) GEL Apply 1 application topically 2 (two) times daily. Apply  To affected skin area bid, after rad tx an in am after shower,just not 4 hours prior to rad tx      . Multiple Vitamin (MULTIVITAMIN) tablet Take 1 tablet by mouth daily.      . non-metallic deodorant [name]) MISC Apply 1 application topically daily.      . valsartan-hydrochlorothiazide (DIOVAN-HCT) 320-25 MG per tablet Take 0.5 tablets by mouth daily.       Marland Kitchen  esomeprazole (NEXIUM) 40 MG capsule Take 40 mg by mouth daily as needed. For reflux      . lidocaine-prilocaine (EMLA) cream Apply topically as needed.  30 g  7  . Omega-3 Fatty Acids (FISH OIL PO) Take 1 capsule by mouth daily.      Marland Kitchen OVER THE COUNTER MEDICATION Take by mouth as needed.      . potassium chloride SA (K-DUR,KLOR-CON) 20 MEQ tablet Take 1 tablet (20 mEq total) by mouth 3 (three) times daily. For 5 days then as directed.  60 tablet  0  . [DISCONTINUED] prochlorperazine (COMPAZINE) 10 MG tablet Take 1 tablet (10 mg total) by mouth every 6 (six) hours as needed (Nausea or vomiting).  30 tablet  1  . [DISCONTINUED] prochlorperazine (COMPAZINE) 25 MG suppository Place 1 suppository (25 mg total) rectally every 12 (twelve) hours as needed for nausea.  12 suppository  3   No current facility-administered medications for this visit.    SURGICAL HISTORY:  Past Surgical History  Procedure Laterality Date  . Anal sphincterotomy  04/2011  . Hemorrhoid surgery  04/2011    ligation  . Breast lumpectomy with needle localization Left 02/04/2013    Procedure: LEFT BREAST LUMPECTOMY WITH NEEDLE LOCALIZATION;  Surgeon: Almond Lint, [name];  Location: [facility] OR;  Service: General;  Laterality: Left;  . Axillary lymph node dissection Left 02/04/2013    Procedure: LEFT AXILLARY LYMPH NODE DISSECTION;  Surgeon: Almond Lint, [name];  Location: [facility] OR;  Service: General;  Laterality: Left;  End: 1512  . Portacath placement Right 02/04/2013    Procedure: INSERTION PORT-A-CATH;  Surgeon: Almond Lint, [name];  Location: [facility] OR;  Service: General;  Laterality: Right;  Start Time: 9629.  Marland Kitchen Appendectomy  1980  . Breast surgery      Lumpectomy in april 2014    REVIEW OF SYSTEMS:   A 10 point review of systems was conducted and is otherwise negative except for what is noted above.    PHYSICAL EXAMINATION: Blood pressure 129/83, pulse 87, temperature 98.7 F (37.1 C), temperature source Oral, resp. rate 18, height 5\' 2"   (1.575 m), weight 142 lb 6.4 oz (64.592 kg). Body mass index is 26.04 kg/(m^2). General: Patient is a well appearing female in no acute distress HEENT: PERRLA, sclerae anicteric no conjunctival pallor, MMM Neck: supple, no palpable adenopathy Lungs: clear to auscultation bilaterally, no wheezes, rhonchi, or rales Cardiovascular: regular rate rhythm, S1, S2, no murmurs, rubs or gallops Abdomen: Soft, non-tender, non-distended, normoactive bowel sounds, no HSM Extremities: warm and well perfused, no clubbing, cyanosis, or edema,  Skin: No rashes, lesions, her two great toes have band-aids over the nail.  The skin on the sole of her feet is intact, it is not dry, or cracking, sensation tested and intact.   Neuro: Non-focal ECOG PERFORMANCE STATUS: 1 - Symptomatic but  completely ambulatory  LABORATORY DATA: Lab Results  Component Value Date   WBC 17.8* 08/28/2013   HGB 9.5* 08/28/2013   HCT 30.1* 08/28/2013   MCV 96.8 08/28/2013   PLT 154 08/28/2013      Chemistry      Component Value Date/Time   NA 140 08/28/2013 1409   NA 139 07/20/2013 0557   K 3.5 08/28/2013 1409   K 3.7 07/20/2013 0557   CL 106 07/20/2013 0557   CL 100 04/23/2013 1447   CO2 26 08/28/2013 1409   CO2 26 07/20/2013 0557   BUN 9.2 08/28/2013 1409   BUN 8 07/20/2013 0557   CREATININE 0.8 08/28/2013 1409   CREATININE 0.58 07/20/2013 0557      Component Value Date/Time   CALCIUM 9.9 08/28/2013 1409   CALCIUM 9.1 07/20/2013 0557   ALKPHOS 125 08/28/2013 1409   ALKPHOS 32* 07/19/2013 0717   AST 18 08/28/2013 1409   AST 20 07/19/2013 0717   ALT 23 08/28/2013 1409   ALT 28 07/19/2013 0717   BILITOT 0.42 08/28/2013 1409   BILITOT 0.2* 07/19/2013 0717       RADIOGRAPHIC STUDIES:  No results found.  ASSESSMENT: 57 year old female with new diagnosis of   #1 multifocal invasive ductal carcinoma grade 3 ER weakly positive at 3% PR negative HER-2/neu negative with Ki-67 of 90% tumor is very aggressive. She did have a  palpable lymph node as well in the left axilla. Patient was seen in the multidisciplinary clinic for discussion of treatment options.   #2 patient underwent a lumpectomy with axillary lymph node dissection with Dr. Luz Brazen on 02/04/13. A 1.6 cm tumor was removed with 1/19 lymph nodes positive for disease.    #3 She was recommended adjuvant chemotherapy consisting of Adriamycin Cytoxan given dose dense for total of 4 cycles followed by Taxol carboplatinum weekly for 12 weeks. Rationale for this aggressive treatment was discussed with the patient since she is essentially has a triple-negative disease.  She received 4 cycles of Adriamycin/Cytoxan, and 9 cycles of Taxol/Carbo.  Due to rash likely from Taxol it was discontinued, patient then received 2 cycles of Gemzar carbo given on a q 14 day schedule. Patient completed all of her chemotherapy on 08/21/2013   #4 certainly patient will need post lumpectomy radiation therapy she was seen by radiation oncology. This will begin after patient has completed her chemotherapy.   #5 we discussed role of genetics, and she had an appointment with Maylon Cos on 03/06/13 the results were negative.      #6 neuropathy on gabapentin  PLAN:  1. Patient is doing well.  She and I discussed her neuropathy.  She will take Gabapentin 300mg  TID instead of 200mg  TID.  I reviewed this with her.    2.  She will continue with radiation.   3.  We will see her back the first week in January.    All questions were answered. The patient knows to call the clinic with any problems, questions or concerns. We can certainly see the patient much sooner if necessary.  I spent 25 minutes counseling the patient face to face. The total time spent in the appointment was 30 minutes.  Illa Level, NP Medical Oncology [facility] [phone] 09/18/2013, 1:38 PM

## 2013-09-18 NOTE — Patient Instructions (Signed)
Increase gabapentin dose to 300mg  (three 100mg  tablets) three times per day.  Please call us if you have any questions or concerns.

## 2013-09-19 ENCOUNTER — Ambulatory Visit
Admission: RE | Admit: 2013-09-19 | Discharge: 2013-09-19 | Disposition: A | Discharge status: Home / Self Care | Payer: BC Managed Care – PPO | Source: Ambulatory Visit | Attending: Radiation Oncology | Admitting: Radiation Oncology

## 2013-09-22 ENCOUNTER — Ambulatory Visit
Admission: RE | Admit: 2013-09-22 | Discharge: 2013-09-22 | Disposition: A | Discharge status: Home / Self Care | Payer: BC Managed Care – PPO | Source: Ambulatory Visit | Attending: Radiation Oncology | Admitting: Radiation Oncology

## 2013-09-23 ENCOUNTER — Telehealth: Payer: Self-pay | Admitting: Genetic Counselor

## 2013-09-23 ENCOUNTER — Ambulatory Visit
Admission: RE | Admit: 2013-09-23 | Discharge: 2013-09-23 | Disposition: A | Discharge status: Home / Self Care | Payer: BC Managed Care – PPO | Source: Ambulatory Visit | Attending: Radiation Oncology | Admitting: Radiation Oncology

## 2013-09-23 ENCOUNTER — Encounter: Payer: Self-pay | Admitting: Radiation Oncology

## 2013-09-23 VITALS — BP 117/69 | HR 81 | Temp 98.1°F | Resp 20 | Wt 141.9 lb

## 2013-09-23 DIAGNOSIS — C50212 Malignant neoplasm of upper-inner quadrant of left female breast: Secondary | ICD-10-CM

## 2013-09-23 NOTE — Telephone Encounter (Signed)
Ms. Doxtater provided verbal consent to fax/email her family history to [facility] for her daughter's appointment tomorrow.  I will email it over to [facility] [place] [facility].

## 2013-09-23 NOTE — Progress Notes (Signed)
Weekly rad txs lt breast  11 completed, mild erythem, skin intact, radiaplex bid, c/o constipation, takes colace occasionally, energy level good, eating well,drinks plenty water, 3:19 PM

## 2013-09-23 NOTE — Progress Notes (Signed)
[facility]    Radiation Oncology [address] [place]     [name], M.D. [place], [place] [address]               [name], M.D., Ph.D. Phone: [phone]      [name], M.D. Fax: [phone]      [name], M.D., Ph.D.         [name], M.D.         [name], M.D Weekly Treatment Management Note  Name: Kristi [name]     MRN: [id number]        CSN: [id number] Date: 09/23/2013      DOB: 02/28/1956  CC: Altamese [place], MD         [name]    Status: Outpatient  Diagnosis: The encounter diagnosis was Cancer of upper-inner quadrant of female breast, left.   Current Dose: 19.8 gy  Current Fraction: 11  Planned Dose: 63 Gy  Narrative: Kristi [name] was [name] today for weekly treatment management. The chart was checked and port films  were reviewed. She continues to tolerate her treatments well without any side effects.  Labs:   Physical Examination:  Filed Vitals:   09/23/13 1514  BP: 117/69  Pulse: 81  Temp: 98.1 F (36.7 C)  Resp: 20    Wt Readings from Last 3 Encounters:  09/23/13 141 lb 14.4 oz (64.365 kg)  09/18/13 142 lb 6.4 oz (64.592 kg)  09/16/13 142 lb 3.2 oz (64.501 kg)    The left breast area shows some mild erythema and hyperpigmentation changes. Lungs - Normal respiratory effort, chest expands symmetrically. Lungs are clear to auscultation, no crackles or wheezes.  Heart has regular rhythm and rate  Abdomen is soft and non tender with normal bowel sounds  Assessment:  Patient tolerating treatments well  Plan: Continue treatment per original radiation prescription

## 2013-09-24 ENCOUNTER — Ambulatory Visit
Admission: RE | Admit: 2013-09-24 | Discharge: 2013-09-24 | Disposition: A | Discharge status: Home / Self Care | Payer: BC Managed Care – PPO | Source: Ambulatory Visit | Attending: Radiation Oncology | Admitting: Radiation Oncology

## 2013-09-25 ENCOUNTER — Ambulatory Visit
Admission: RE | Admit: 2013-09-25 | Discharge: 2013-09-25 | Disposition: A | Discharge status: Home / Self Care | Payer: BC Managed Care – PPO | Source: Ambulatory Visit | Attending: Radiation Oncology | Admitting: Radiation Oncology

## 2013-09-26 ENCOUNTER — Ambulatory Visit
Admission: RE | Admit: 2013-09-26 | Discharge: 2013-09-26 | Disposition: A | Discharge status: Home / Self Care | Payer: BC Managed Care – PPO | Source: Ambulatory Visit | Attending: Radiation Oncology | Admitting: Radiation Oncology

## 2013-09-29 ENCOUNTER — Ambulatory Visit
Admission: RE | Admit: 2013-09-29 | Discharge: 2013-09-29 | Disposition: A | Discharge status: Home / Self Care | Payer: BC Managed Care – PPO | Source: Ambulatory Visit | Attending: Radiation Oncology | Admitting: Radiation Oncology

## 2013-09-30 ENCOUNTER — Ambulatory Visit
Admission: RE | Admit: 2013-09-30 | Discharge: 2013-09-30 | Disposition: A | Discharge status: Home / Self Care | Payer: BC Managed Care – PPO | Source: Ambulatory Visit | Attending: Radiation Oncology | Admitting: Radiation Oncology

## 2013-09-30 VITALS — BP 126/81 | HR 79 | Temp 98.8°F | Ht 62.0 in | Wt 140.2 lb

## 2013-09-30 DIAGNOSIS — C50212 Malignant neoplasm of upper-inner quadrant of left female breast: Secondary | ICD-10-CM

## 2013-09-30 NOTE — Progress Notes (Signed)
Kristi [name] here with her daughter for weekly under treat visit.  She has had 16 fractions to her left breast   She reports fatigue.  She is also having neurpathy/stiffness in both ankles/feet.  Her gabapentin was recently increased to 900 mg a day.  The skin on her left breast is intact.  She is using radiaplex gel.

## 2013-09-30 NOTE — Progress Notes (Signed)
[facility]    Radiation Oncology [address] [place]     [name], M.D. [place], [place] [address]               [name], M.D., Ph.D. Phone: [phone]      [name], M.D. Fax: [phone]      [name], M.D., Ph.D.         [name], M.D.         [name], M.D Weekly Treatment Management Note  Name: Kristi [name]     MRN: [id number]        CSN: [id number] Date: 09/30/2013      DOB: 1956/09/03  CC: Altamese [place], MD         [name]    Status: Outpatient  Diagnosis: The encounter diagnosis was Cancer of upper-inner quadrant of female breast, left.  Current Dose: 28.8 Gy  Current Fraction: 16  Planned Dose: 63 Gy  Narrative: [name] was seen today for weekly treatment management. The chart was checked and port films  were reviewed. She is tolerating her treatments well this time. Her biggest problem is her peripheral neuropathy related to her feet. Her Neurontin was recently increased.  The patient did slip the has had some discomfort in the breast area since that episode  Review of patient's allergies indicates no known allergies. Current Outpatient Prescriptions  Medication Sig Dispense Refill  . ALPRAZolam (XANAX) 1 MG tablet Take 2 mg by mouth at bedtime as needed for sleep.       Marland Kitchen amphetamine-dextroamphetamine (ADDERALL) 10 MG tablet Take 20 mg by mouth daily.       Marland Kitchen aspirin EC 81 MG EC tablet Take 1 tablet (81 mg total) by mouth daily.  30 tablet  0  . B Complex-C (SUPER B COMPLEX PO) Take 1 tablet by mouth daily.      Marland Kitchen docusate sodium (COLACE) 100 MG capsule Take 100 mg by mouth 2 (two) times daily as needed for constipation.      Marland Kitchen escitalopram (LEXAPRO) 20 MG tablet Take 1 tablet (20 mg total) by mouth daily.  60 tablet  6  . esomeprazole (NEXIUM) 40 MG capsule Take 40 mg by mouth daily as needed. For reflux      . gabapentin (NEURONTIN) 100 MG capsule Take 2 capsules (200 mg total) by mouth 3 (three)  times daily.  180 capsule  2  . hyaluronate sodium (RADIAPLEXRX) GEL Apply 1 application topically 2 (two) times daily. Apply  To affected skin area bid, after rad tx an in am after shower,just not 4 hours prior to rad tx      . lidocaine-prilocaine (EMLA) cream Apply topically as needed.  30 g  7  . Multiple Vitamin (MULTIVITAMIN) tablet Take 1 tablet by mouth daily.      . non-metallic deodorant [name]) MISC Apply 1 application topically daily.      . Omega-3 Fatty Acids (FISH OIL PO) Take 1 capsule by mouth daily.      . valsartan-hydrochlorothiazide (DIOVAN-HCT) 320-25 MG per tablet Take 0.5 tablets by mouth daily.       . [DISCONTINUED] prochlorperazine (COMPAZINE) 10 MG tablet Take 1 tablet (10 mg total) by mouth every 6 (six) hours as needed (Nausea or vomiting).  30 tablet  1  . [DISCONTINUED] prochlorperazine (COMPAZINE) 25 MG suppository Place 1 suppository (25 mg total) rectally every 12 (twelve) hours as needed for nausea.  12 suppository  3   No current facility-administered medications for this encounter.   Labs:  Physical Examination:  Filed Vitals:   09/30/13 1508  BP: 126/81  Pulse: 79  Temp: 98.8 F (37.1 C)    Wt Readings from Last 3 Encounters:  09/30/13 140 lb 3.2 oz (63.594 kg)  09/23/13 141 lb 14.4 oz (64.365 kg)  09/18/13 142 lb 6.4 oz (64.592 kg)    The left breast area shows mild hyperpigmentation changes. There appears to be some swelling in the upper aspect of the breast possibly related to the patient's slipping. She feels she did catch herself with her arms and possibly some strain on the pectoralis muscle. Lungs - Normal respiratory effort, chest expands symmetrically. Lungs are clear to auscultation, no crackles or wheezes.  Heart has regular rhythm and rate  Abdomen is soft and non tender with normal bowel sounds  Assessment:  Patient tolerating treatments well  Plan: Continue treatment per original radiation prescription.  She will monitor for any  potential swelling in the left breast.

## 2013-10-01 ENCOUNTER — Ambulatory Visit
Admission: RE | Admit: 2013-10-01 | Discharge: 2013-10-01 | Disposition: A | Discharge status: Home / Self Care | Payer: BC Managed Care – PPO | Source: Ambulatory Visit | Attending: Radiation Oncology | Admitting: Radiation Oncology

## 2013-10-03 ENCOUNTER — Other Ambulatory Visit: Payer: Self-pay | Admitting: Emergency Medicine

## 2013-10-03 MED ORDER — GABAPENTIN 300 MG PO CAPS: 300.0000 mg | ORAL_CAPSULE | Freq: Three times a day (TID) | ORAL | Status: DC

## 2013-10-06 ENCOUNTER — Telehealth: Payer: Self-pay | Admitting: Medical Oncology

## 2013-10-06 ENCOUNTER — Emergency Department (HOSPITAL_COMMUNITY): Payer: BC Managed Care – PPO

## 2013-10-06 ENCOUNTER — Telehealth: Payer: Self-pay | Admitting: *Deleted

## 2013-10-06 ENCOUNTER — Emergency Department (HOSPITAL_COMMUNITY)
Admission: EM | Admit: 2013-10-06 | Discharge: 2013-10-06 | Disposition: A | Discharge status: Home / Self Care | Payer: BC Managed Care – PPO | Attending: Emergency Medicine | Admitting: Emergency Medicine

## 2013-10-06 ENCOUNTER — Ambulatory Visit
Admission: RE | Admit: 2013-10-06 | Discharge: 2013-10-06 | Disposition: A | Discharge status: Home / Self Care | Payer: BC Managed Care – PPO | Source: Ambulatory Visit | Attending: Radiation Oncology | Admitting: Radiation Oncology

## 2013-10-06 ENCOUNTER — Encounter (HOSPITAL_COMMUNITY): Payer: Self-pay | Admitting: Emergency Medicine

## 2013-10-06 DIAGNOSIS — R52 Pain, unspecified: Secondary | ICD-10-CM | POA: Insufficient documentation

## 2013-10-06 DIAGNOSIS — G8929 Other chronic pain: Secondary | ICD-10-CM | POA: Insufficient documentation

## 2013-10-06 DIAGNOSIS — Z7982 Long term (current) use of aspirin: Secondary | ICD-10-CM | POA: Insufficient documentation

## 2013-10-06 DIAGNOSIS — M79672 Pain in left foot: Secondary | ICD-10-CM

## 2013-10-06 DIAGNOSIS — F329 Major depressive disorder, single episode, unspecified: Secondary | ICD-10-CM | POA: Insufficient documentation

## 2013-10-06 DIAGNOSIS — Z8719 Personal history of other diseases of the digestive system: Secondary | ICD-10-CM | POA: Insufficient documentation

## 2013-10-06 DIAGNOSIS — F3289 Other specified depressive episodes: Secondary | ICD-10-CM | POA: Insufficient documentation

## 2013-10-06 DIAGNOSIS — Z8614 Personal history of Methicillin resistant Staphylococcus aureus infection: Secondary | ICD-10-CM | POA: Insufficient documentation

## 2013-10-06 DIAGNOSIS — Z853 Personal history of malignant neoplasm of breast: Secondary | ICD-10-CM | POA: Insufficient documentation

## 2013-10-06 DIAGNOSIS — M25579 Pain in unspecified ankle and joints of unspecified foot: Secondary | ICD-10-CM | POA: Insufficient documentation

## 2013-10-06 DIAGNOSIS — Z79899 Other long term (current) drug therapy: Secondary | ICD-10-CM | POA: Insufficient documentation

## 2013-10-06 DIAGNOSIS — F411 Generalized anxiety disorder: Secondary | ICD-10-CM | POA: Insufficient documentation

## 2013-10-06 DIAGNOSIS — Z862 Personal history of diseases of the blood and blood-forming organs and certain disorders involving the immune mechanism: Secondary | ICD-10-CM | POA: Insufficient documentation

## 2013-10-06 DIAGNOSIS — I10 Essential (primary) hypertension: Secondary | ICD-10-CM | POA: Insufficient documentation

## 2013-10-06 MED ORDER — IBUPROFEN 200 MG PO TABS
600.0000 mg | ORAL_TABLET | Freq: Once | ORAL | Status: AC
  Administered 2013-10-06: 600 mg via ORAL
  Filled 2013-10-06: qty 3

## 2013-10-06 MED ORDER — HYDROCODONE-ACETAMINOPHEN 5-325 MG PO TABS
1.0000 | ORAL_TABLET | Freq: Once | ORAL | Status: AC
  Administered 2013-10-06: 1 via ORAL
  Filled 2013-10-06: qty 1

## 2013-10-06 MED ORDER — HYDROCODONE-ACETAMINOPHEN 5-325 MG PO TABS: 1.0000 | ORAL_TABLET | Freq: Four times a day (QID) | ORAL | Status: DC | PRN

## 2013-10-06 MED ORDER — IBUPROFEN 600 MG PO TABS: 600.0000 mg | ORAL_TABLET | Freq: Four times a day (QID) | ORAL | Status: DC | PRN

## 2013-10-06 NOTE — Telephone Encounter (Signed)
Call from  Diane, RN  spoke with pt who has concern about her ankle. Called pt. Pt was calm, spoke in a soft tone, states.my left leg is drawing up, my left ankle, about 1 in above it is really tight, hurts to turn. Sore on the side, bottom of feet under toes. When I had the reaction to chemo and had a rash on my leg; when I touch it the sensation goes down my leg. My heels are sore. I've been able to walk, it's hard but i wear flip flops and socks, my toes are cold." Pt denied fever, swelling to leg/ankle and pt has feeling in leg/ankle and toes. Pt currently taking gabapentin 300mg  TID. Pt denied going shopping or standing for long periods of time over the weekend.  Last seen 11/13   Next f/u1/8/15 Will review with provider.

## 2013-10-06 NOTE — ED Notes (Signed)
Patient reports that she has increased pain when standing and "a weird feeling" in both feet. L>R. Patient has been taking neurontin and states that the medication is not working. Patient has breast cancer and is taking radiation at this time.

## 2013-10-06 NOTE — Telephone Encounter (Signed)
Pt daughter called states she has not over exerted herself this weekend as pt has was not moving around or going anywhere due to pt having pain in legs/feet. Pt's daughter advised the sme symptoms are also affecting the right leg. Pt is currently at radiation ans will be taken to Urgent care or the ED  after she is finished. Discussed with Daughter there may be something going on that needs to be further evaluated that cannot be done in the office setting. Daughter verbalized understanding. Will be taking pt to ED or urgent care.

## 2013-10-06 NOTE — Telephone Encounter (Signed)
See LC with next available or she should be seen in urgent care

## 2013-10-06 NOTE — ED Notes (Signed)
MD at bedside. Dr. Horton at bedside.  

## 2013-10-06 NOTE — Telephone Encounter (Signed)
Operator brought request to Triage to call patient who has called about foot discomfort.  Spoke with Kristi [name] who reports both feet are giving her problems.  The left is worse than the right.  The right side has a spot on the inner knee that when she touches the knee, the pain is felt in the rt. Ankle.  Reports wearing diabetic socks to help circulation and the gabapentin was increased to 300 mg three times a day but no improvement noted with the changes started on 09-18-2013.  Pain started with the chemo reaction to the taxol/carboplatin.  Asked that she go to the Urgent Care on [facility]. Or [address]  Nicki Reaper there is one on [address] that is affiliated with [facility] that her daughter has used and she feels comfortable here.  Preparing for RT at 3:00 pm.  Call ended.  Will notify providers.

## 2013-10-06 NOTE — Telephone Encounter (Signed)
Reported to dr Rosine Beat nurse.

## 2013-10-06 NOTE — Telephone Encounter (Signed)
Daughter called that her mother is in tears with her right leg feels like it is "drawing up on the bottom of her Right  foot and something has changed something is wrong with her right leg" She is on Neurontin 300 mg tid x 2 weeks ago.Daughter said pt wanted her to call for pt. Pts husband is back on his way home because pt is "in a panic".

## 2013-10-06 NOTE — ED Provider Notes (Signed)
CSN: [id number]     Arrival date & time 10/06/13  1608 History   First MD Initiated Contact with Patient 10/06/13 1751     Chief Complaint  Patient presents with  . Foot Swelling   (Consider location/radiation/quality/duration/timing/severity/associated sxs/prior Treatment) HPI  This is a 57 year old female currently being treated for breast cancer who presents with left foot pain. Patient reports that she has had progressive left foot pain over the last 3 months. Onset was during chemotherapy 3 months ago. She denies any injury. Patient has been titrated up on Neurontin by her oncologist but continues to endorse pain. Pain is worse at night and with ambulation. She describes as a "fullness."  She states that sometimes it wakes her up. She denies any weakness in that leg.  Past Medical History  Diagnosis Date  . Anxiety   . GERD (gastroesophageal reflux disease)   . Hypertension   . MRSA (methicillin resistant Staphylococcus aureus) 2009  . Depression   . Anemia     Iron deficinecy anemia  . Breast cancer     left   Past Surgical History  Procedure Laterality Date  . Anal sphincterotomy  04/2011  . Hemorrhoid surgery  04/2011    ligation  . Breast lumpectomy with needle localization Left 02/04/2013    Procedure: LEFT BREAST LUMPECTOMY WITH NEEDLE LOCALIZATION;  Surgeon: Almond Lint, MD;  Location: [facility] OR;  Service: General;  Laterality: Left;  . Axillary lymph node dissection Left 02/04/2013    Procedure: LEFT AXILLARY LYMPH NODE DISSECTION;  Surgeon: Almond Lint, MD;  Location: [facility] OR;  Service: General;  Laterality: Left;  End: 1512  . Portacath placement Right 02/04/2013    Procedure: INSERTION PORT-A-CATH;  Surgeon: Almond Lint, MD;  Location: [facility] OR;  Service: General;  Laterality: Right;  Start Time: 7829.  Marland Kitchen Appendectomy  1980  . Breast surgery      Lumpectomy in april 2014   Family History  Problem Relation Age of Onset  . Lung cancer Father   . Hypertension Father   .  Thyroid cancer Father     dx in his 43s  . Breast cancer Paternal Aunt 30  . Colon cancer Paternal Aunt     dx in her 50x  . Cervical cancer Paternal Aunt     dzx in her 55s  . Ovarian cancer Cousin     dx in her lage 15s  . Breast cancer Cousin     maternal first cousin, once removed; dx in her late 66s  . Breast cancer Cousin     maternal first cousin once removed; dx in late 24s  . Hypertension Mother   . Diabetes Mother   . Dementia Mother   . Hypertension Brother   . Seizures Brother     Alcohol induced.  . Cancer Paternal Uncle     oral cancer  . Kidney cancer Paternal Grandmother   . Cancer Cousin     several paternal cousins with brain cancer, leukemia, and other cancers   History  Substance Use Topics  . Smoking status: Never Smoker   . Smokeless tobacco: Never Used  . Alcohol Use: No   OB History   Grav Para Term Preterm Abortions TAB SAB Ect Mult Living                 Review of Systems  Constitutional: Negative for fever.  Respiratory: Negative for cough, chest tightness and shortness of breath.   Cardiovascular: Negative for chest pain and  leg swelling.  Gastrointestinal: Negative for nausea, vomiting and abdominal pain.  Musculoskeletal: Negative for gait problem and joint swelling.  Skin: Negative for wound.  Neurological: Negative for headaches.  Psychiatric/Behavioral: Negative for confusion.  All other systems reviewed and are negative.    Allergies  Review of patient's allergies indicates no known allergies.  Home Medications   Current Outpatient Rx  Name  Route  Sig  Dispense  Refill  . ALPRAZolam (XANAX) 1 MG tablet   Oral   Take 2 mg by mouth at bedtime as needed for sleep.          Marland Kitchen amphetamine-dextroamphetamine (ADDERALL) 10 MG tablet   Oral   Take 20 mg by mouth daily.          Marland Kitchen aspirin EC 81 MG EC tablet   Oral   Take 1 tablet (81 mg total) by mouth daily.   30 tablet   0   . B Complex-C (SUPER B COMPLEX PO)    Oral   Take 1 tablet by mouth daily.         Marland Kitchen docusate sodium (COLACE) 100 MG capsule   Oral   Take 100 mg by mouth 2 (two) times daily as needed for constipation.         Marland Kitchen escitalopram (LEXAPRO) 20 MG tablet   Oral   Take 1 tablet (20 mg total) by mouth daily.   60 tablet   6   . gabapentin (NEURONTIN) 300 MG capsule   Oral   Take 1 capsule (300 mg total) by mouth 3 (three) times daily.   90 capsule   3   . hyaluronate sodium (RADIAPLEXRX) GEL   Topical   Apply 1 application topically 2 (two) times daily. Apply  To affected skin area bid, after rad tx an in am after shower,just not 4 hours prior to rad tx         . lidocaine-prilocaine (EMLA) cream   Topical   Apply topically as needed.   30 g   7   . Multiple Vitamin (MULTIVITAMIN) tablet   Oral   Take 1 tablet by mouth daily.         . non-metallic deodorant [name]) MISC   Topical   Apply 1 application topically daily.         . valsartan-hydrochlorothiazide (DIOVAN-HCT) 320-25 MG per tablet   Oral   Take 0.5 tablets by mouth daily.          Marland Kitchen HYDROcodone-acetaminophen (NORCO/VICODIN) 5-325 MG per tablet   Oral   Take 1 tablet by mouth every 6 (six) hours as needed.   10 tablet   0   . ibuprofen (ADVIL,MOTRIN) 600 MG tablet   Oral   Take 1 tablet (600 mg total) by mouth every 6 (six) hours as needed.   30 tablet   0    BP 118/79  Pulse 82  Temp(Src) 98.4 F (36.9 C) (Oral)  Resp 16  SpO2 97%  LMP 01/20/2013 Physical Exam  Nursing note and vitals reviewed. Constitutional: She is oriented to person, place, and time. No distress.  HENT:  Head: Normocephalic and atraumatic.  Alopecia  Eyes: Pupils are equal, round, and reactive to light.  Cardiovascular: Normal rate and regular rhythm.   Pulmonary/Chest: Effort normal. No respiratory distress.  Abdominal: Soft. There is no tenderness.  Musculoskeletal:  At this examination of the left lower extremity reveals full range of motion of  the left ankle. No notable swelling. No  overlying skin changes. No warmth or erythema noted to the joint. She has no tenderness to palpation.  Neurological: She is alert and oriented to person, place, and time.  Skin: Skin is warm and dry.  Psychiatric: She has a normal mood and affect.    ED Course  Procedures (including critical care time) Labs Review Labs Reviewed - No data to display Imaging Review Dg Ankle Complete Left  10/06/2013   CLINICAL DATA:  Left ankle pain  EXAM: LEFT ANKLE COMPLETE - 3+ VIEW  COMPARISON:  None.  FINDINGS: Ankle mortise intact. The talar dome is normal. No malleolar fracture. The calcaneus is normal. Mild spurring of the calcaneus.  IMPRESSION: No acute osseous abnormality.   Electronically Signed   By: Genevive Bi M.D.   On: 10/06/2013 18:56    EKG Interpretation   None       MDM   1. Foot pain, left    Patient presents with acute on chronic worsening left foot pain. She is nontoxic-appearing on exam. Patient has full range of motion at the left ankle without any evidence of infection. Have low suspicion for joint space disease including gout given lack of swelling, warmth or erythema. Plain films are negative. Patient has been treated with Neurontin by her oncologist. Patient was given ibuprofen and hydrocodone. She has been angulatory. She does report some improvement of her pain. We'll refer her back to her oncologist for further titration of Neurontin. Suspect neuropathy may be a source. She has no calf swelling or tenderness. Patient will be given hydrocodone and ibuprofen for additional pain relief.  After history, exam, and medical workup I feel the patient has been appropriately medically screened and is safe for discharge home. Pertinent diagnoses were discussed with the patient. Patient was given return precautions.     Shon Baton, MD 10/06/13 [phone]

## 2013-10-07 ENCOUNTER — Ambulatory Visit
Admission: RE | Admit: 2013-10-07 | Discharge: 2013-10-07 | Disposition: A | Discharge status: Home / Self Care | Payer: BC Managed Care – PPO | Source: Ambulatory Visit | Attending: Radiation Oncology | Admitting: Radiation Oncology

## 2013-10-07 VITALS — BP 115/68 | HR 72 | Temp 98.6°F | Wt 141.0 lb

## 2013-10-07 DIAGNOSIS — C50212 Malignant neoplasm of upper-inner quadrant of left female breast: Secondary | ICD-10-CM

## 2013-10-07 NOTE — Progress Notes (Signed)
Kristi [name] here for weekly under treat visit.  She has had 19 fractions to her left breast and subclavian area.  She denies pain now.  Last night her left ankle was "tight" and painful when she moved her foot.  She went to the ER and had an xray which did not show anything.  Today it is still tight but not as painful.  Her right ankle feels about the same.  Her skin is intact with hyperpigmentation/redness under her left breast and on her upper left chest.  She is using radiaplex.  She denies fatigue.

## 2013-10-07 NOTE — Progress Notes (Signed)
[facility]    Radiation Oncology [address] [place]     [name], M.D. [place], [place] [address]               [name], M.D., Ph.D. Phone: [phone]      [name], M.D. Fax: [phone]      [name], M.D., Ph.D.         [name], M.D.         [name], M.D Weekly Treatment Management Note  Name: Kristi [name]     MRN: [id number]        CSN: [id number] Date: 10/07/2013      DOB: 24-Oct-1956  CC: Kristi Hodge, MD         [name]    Status: Outpatient  Diagnosis: The encounter diagnosis was Cancer of upper-inner quadrant of female breast, left.  Current Dose: 34.2 Gy  Current Fraction: 19  Planned Dose: 63 Gy  Narrative: Kristi [name] was [name] today for weekly treatment management. The chart was checked and port films  were reviewed. She has noticed some mild discomfort in the lower aspect of breast area but otherwise is tolerating her treatments well  Review of patient's allergies indicates no known allergies. Current Outpatient Prescriptions  Medication Sig Dispense Refill  . ALPRAZolam (XANAX) 1 MG tablet Take 2 mg by mouth at bedtime as needed for sleep.       Marland Kitchen amphetamine-dextroamphetamine (ADDERALL) 10 MG tablet Take 20 mg by mouth daily.       Marland Kitchen aspirin EC 81 MG EC tablet Take 1 tablet (81 mg total) by mouth daily.  30 tablet  0  . B Complex-C (SUPER B COMPLEX PO) Take 1 tablet by mouth daily.      Marland Kitchen docusate sodium (COLACE) 100 MG capsule Take 100 mg by mouth 2 (two) times daily as needed for constipation.      Marland Kitchen escitalopram (LEXAPRO) 20 MG tablet Take 1 tablet (20 mg total) by mouth daily.  60 tablet  6  . gabapentin (NEURONTIN) 300 MG capsule Take 1 capsule (300 mg total) by mouth 3 (three) times daily.  90 capsule  3  . hyaluronate sodium (RADIAPLEXRX) GEL Apply 1 application topically 2 (two) times daily. Apply  To affected skin area bid, after rad tx an in am after shower,just not 4 hours prior to rad  tx      . lidocaine-prilocaine (EMLA) cream Apply topically as needed.  30 g  7  . Multiple Vitamin (MULTIVITAMIN) tablet Take 1 tablet by mouth daily.      . non-metallic deodorant [name]) MISC Apply 1 application topically daily.      . valsartan-hydrochlorothiazide (DIOVAN-HCT) 320-25 MG per tablet Take 0.5 tablets by mouth daily.       Marland Kitchen HYDROcodone-acetaminophen (NORCO/VICODIN) 5-325 MG per tablet Take 1 tablet by mouth every 6 (six) hours as needed.  10 tablet  0  . ibuprofen (ADVIL,MOTRIN) 600 MG tablet Take 1 tablet (600 mg total) by mouth every 6 (six) hours as needed.  30 tablet  0  . [DISCONTINUED] prochlorperazine (COMPAZINE) 10 MG tablet Take 1 tablet (10 mg total) by mouth every 6 (six) hours as needed (Nausea or vomiting).  30 tablet  1  . [DISCONTINUED] prochlorperazine (COMPAZINE) 25 MG suppository Place 1 suppository (25 mg total) rectally every 12 (twelve) hours as needed for nausea.  12 suppository  3   No current facility-administered medications for this  encounter.   Labs:  Lab Results  Component Value Date   WBC 17.8* 08/28/2013   HGB 9.5* 08/28/2013   HCT 30.1* 08/28/2013   MCV 96.8 08/28/2013   PLT 154 08/28/2013   Lab Results  Component Value Date   CREATININE 0.8 08/28/2013   BUN 9.2 08/28/2013   NA 140 08/28/2013   K 3.5 08/28/2013   CL 106 07/20/2013   CO2 26 08/28/2013   Lab Results  Component Value Date   ALT 23 08/28/2013   AST 18 08/28/2013   BILITOT 0.42 08/28/2013    Physical Examination:  Filed Vitals:   10/07/13 1549  BP: 115/68  Pulse: 72  Temp: 98.6 F (37 C)    Wt Readings from Last 3 Encounters:  10/07/13 141 lb (63.957 kg)  09/30/13 140 lb 3.2 oz (63.594 kg)  09/23/13 141 lb 14.4 oz (64.365 kg)    The left breast area shows some hyperpigmentation changes without skin breakdown Lungs - Normal respiratory effort, chest expands symmetrically. Lungs are clear to auscultation, no crackles or wheezes.  Heart has regular rhythm  and rate  Abdomen is soft and non tender with normal bowel sounds  Assessment:  Patient tolerating treatments well  Plan: Continue treatment per original radiation prescription

## 2013-10-08 ENCOUNTER — Encounter: Payer: Self-pay | Admitting: Radiation Oncology

## 2013-10-08 ENCOUNTER — Ambulatory Visit
Admission: RE | Admit: 2013-10-08 | Discharge: 2013-10-08 | Disposition: A | Discharge status: Home / Self Care | Payer: BC Managed Care – PPO | Source: Ambulatory Visit | Attending: Radiation Oncology | Admitting: Radiation Oncology

## 2013-10-08 NOTE — Progress Notes (Signed)
   Department of Radiation Oncology  Phone:  [phone] Fax:        [phone]  Electron beam simulation note  Today the patient had additional planning for radiation therapy directed at the left breast. The patient's treatment planning CT scan was reviewed and she had set up of a custom electron cutout field directed at the lumpectomy cavity. The patient will be treated with 15 MeV electrons prescribed to the 98% isodose line. Plan is for the patient received 18 gray in 9 fractions. A special port plan is requested for treatment.  -----------------------------------  [name], PhD, MD

## 2013-10-09 ENCOUNTER — Ambulatory Visit: Admission: RE | Admit: 2013-10-09 | Payer: BC Managed Care – PPO | Source: Ambulatory Visit

## 2013-10-09 ENCOUNTER — Ambulatory Visit
Admission: RE | Admit: 2013-10-09 | Discharge: 2013-10-09 | Disposition: A | Discharge status: Home / Self Care | Payer: BC Managed Care – PPO | Source: Ambulatory Visit | Attending: Radiation Oncology | Admitting: Radiation Oncology

## 2013-10-10 ENCOUNTER — Ambulatory Visit
Admission: RE | Admit: 2013-10-10 | Discharge: 2013-10-10 | Disposition: A | Discharge status: Home / Self Care | Payer: BC Managed Care – PPO | Source: Ambulatory Visit | Attending: Radiation Oncology | Admitting: Radiation Oncology

## 2013-10-13 ENCOUNTER — Ambulatory Visit
Admission: RE | Admit: 2013-10-13 | Discharge: 2013-10-13 | Disposition: A | Discharge status: Home / Self Care | Payer: BC Managed Care – PPO | Source: Ambulatory Visit | Attending: Radiation Oncology | Admitting: Radiation Oncology

## 2013-10-14 ENCOUNTER — Ambulatory Visit
Admission: RE | Admit: 2013-10-14 | Discharge: 2013-10-14 | Disposition: A | Discharge status: Home / Self Care | Payer: BC Managed Care – PPO | Source: Ambulatory Visit | Attending: Radiation Oncology | Admitting: Radiation Oncology

## 2013-10-14 ENCOUNTER — Encounter: Payer: Self-pay | Admitting: Oncology

## 2013-10-14 ENCOUNTER — Ambulatory Visit (HOSPITAL_BASED_OUTPATIENT_CLINIC_OR_DEPARTMENT_OTHER): Payer: BC Managed Care – PPO | Admitting: Oncology

## 2013-10-14 ENCOUNTER — Other Ambulatory Visit (HOSPITAL_BASED_OUTPATIENT_CLINIC_OR_DEPARTMENT_OTHER): Payer: BC Managed Care – PPO | Admitting: Lab

## 2013-10-14 VITALS — BP 113/77 | HR 80 | Temp 98.7°F | Resp 18 | Ht 62.0 in | Wt 141.2 lb

## 2013-10-14 VITALS — BP 114/64 | HR 69 | Temp 98.1°F | Ht 62.0 in | Wt 140.7 lb

## 2013-10-14 DIAGNOSIS — C50219 Malignant neoplasm of upper-inner quadrant of unspecified female breast: Secondary | ICD-10-CM

## 2013-10-14 DIAGNOSIS — C50212 Malignant neoplasm of upper-inner quadrant of left female breast: Secondary | ICD-10-CM

## 2013-10-14 DIAGNOSIS — G5790 Unspecified mononeuropathy of unspecified lower limb: Secondary | ICD-10-CM

## 2013-10-14 DIAGNOSIS — C773 Secondary and unspecified malignant neoplasm of axilla and upper limb lymph nodes: Secondary | ICD-10-CM

## 2013-10-14 DIAGNOSIS — C50919 Malignant neoplasm of unspecified site of unspecified female breast: Secondary | ICD-10-CM

## 2013-10-14 DIAGNOSIS — C50912 Malignant neoplasm of unspecified site of left female breast: Secondary | ICD-10-CM

## 2013-10-14 LAB — CBC WITH DIFFERENTIAL/PLATELET
BASO%: 0.2 % (ref 0.0–2.0)
Basophils Absolute: 0 10*3/uL (ref 0.0–0.1)
Eosinophils Absolute: 0.1 10*3/uL (ref 0.0–0.5)
HCT: 32.8 % — ABNORMAL LOW (ref 34.8–46.6)
HGB: 10.7 g/dL — ABNORMAL LOW (ref 11.6–15.9)
LYMPH%: 15.1 % (ref 14.0–49.7)
MCHC: 32.6 g/dL (ref 31.5–36.0)
MCV: 93.6 fL (ref 79.5–101.0)
MONO#: 0.3 10*3/uL (ref 0.1–0.9)
NEUT#: 2.3 10*3/uL (ref 1.5–6.5)
Platelets: 123 10*3/uL — ABNORMAL LOW (ref 145–400)
RBC: 3.5 10*6/uL — ABNORMAL LOW (ref 3.70–5.45)
WBC: 3.3 10*3/uL — ABNORMAL LOW (ref 3.9–10.3)

## 2013-10-14 LAB — COMPREHENSIVE METABOLIC PANEL (CC13)
Albumin: 3.7 g/dL (ref 3.5–5.0)
Alkaline Phosphatase: 61 U/L (ref 40–150)
Anion Gap: 12 mEq/L — ABNORMAL HIGH (ref 3–11)
CO2: 27 mEq/L (ref 22–29)
Calcium: 10 mg/dL (ref 8.4–10.4)
Chloride: 104 mEq/L (ref 98–109)
Glucose: 90 mg/dl (ref 70–140)
Sodium: 143 mEq/L (ref 136–145)
Total Bilirubin: 0.48 mg/dL (ref 0.20–1.20)
Total Protein: 6.5 g/dL (ref 6.4–8.3)

## 2013-10-14 MED ORDER — BIAFINE EX EMUL
Freq: Two times a day (BID) | CUTANEOUS | Status: DC
  Administered 2013-10-14: 16:00:00 via TOPICAL

## 2013-10-14 NOTE — Progress Notes (Signed)
Kristi [name] here with her daughter for under treat visit.  She has had 24 fractions to her left breast and left subclavian area.  She has pain in her left underarm area when moving her left arm.  She does have fatigue mostly in the afternoon.  The skin on her left clavicular area and breast is red with hyperpigmentation.  She has been using radiaplex but would like to switch to biafine.  A tube of biafine has been given.

## 2013-10-14 NOTE — Addendum Note (Signed)
Encounter addended by: Eduardo Osier, RN on: 10/14/2013  4:11 PM<BR>     Documentation filed: Orders, Inpatient MAR

## 2013-10-14 NOTE — Progress Notes (Signed)
OFFICE PROGRESS NOTE  CC**  [name],[name], MD [address] [place] [address]  DIAGNOSIS: 57 year old female with stage IIA ER weakly positive, PR negative HER-2/neu negative, invasive ductal carcinoma of the left breast.   PRIOR THERAPY: 1. Earlier this year it on routine screening mammography the patient was noted to have a abnormality within the 12:00 position of the left breast. Patient returned for additional imaging with 3-D, tomosynthesis. Within the left breast there were 2 masses somewhat lobulated with irregular margins in the upper inner aspect of the left breast. These were separated by approximately a centimeter. On ultrasound the lesion the 11:00 position measured 1.0 x 0.7 cm. The 12:00 lesion measured 1.6 x 0.9 cm. In addition within the axillary region there was a 2.1 x 1.7 suspicious appearing lymph node.. The patient proceeded to undergo ultrasound-guided biopsy of the 2 areas within the left breast as well as the suspicious left axillary lymph node. Biopsies from both breast areas revealed invasive ductal carcinoma. In addition the lymph node from the left axilla revealed ductal carcinoma. The lesions appeared to be high-grade. There was no HER-2/neu amplification and hormone staining was minimal consistent with essentially consistent with triple negative breast cancer.  Patient's case was discussed at the multidisciplinary breast conference her pathology and radiology were reviewed.  2. Patient underwent lumpectomy of the left breast with Dr. Donell Beers on 02/04/13, a 1.6cm tumor was removed along with an axillary lymph node dissection that found 1/19 nodes positive for metastatic disease.    3. Patient was then started on adjuvant chemotherapy on 03/05/13 with Adriamycin/Cytoxan. She began Taxol Carbo on 05/07/13, this was discontinued after 9 cycles due to rash.  She received 2 cycles of Gemzar carbo from 9/25  - 10/16.   4.  Patient began adjuvant radiation on  09/09/13  CURRENT THERAPY:  Radiation therapy.    INTERVAL HISTORY: Kristi [name] 57 y.o. female returns for an evaluation of her foot pain.  The soles of her feet feel like they have shriveled up, her toes have been quivering lately as well.  This became worse and more obvious following chemotherapy.  At night the pain is worse.  Otherwise, she is doing well and a 10 point ROS is negative.    MEDICAL HISTORY: Past Medical History  Diagnosis Date  . Anxiety   . GERD (gastroesophageal reflux disease)   . Hypertension   . MRSA (methicillin resistant Staphylococcus aureus) 2009  . Depression   . Anemia     Iron deficinecy anemia  . Breast cancer     left    ALLERGIES:  has No Known Allergies.  MEDICATIONS:  Current Outpatient Prescriptions  Medication Sig Dispense Refill  . ALPRAZolam (XANAX) 1 MG tablet Take 2 mg by mouth at bedtime as needed for sleep.       Marland Kitchen amphetamine-dextroamphetamine (ADDERALL) 10 MG tablet Take 20 mg by mouth daily.       Marland Kitchen aspirin EC 81 MG EC tablet Take 1 tablet (81 mg total) by mouth daily.  30 tablet  0  . B Complex-C (SUPER B COMPLEX PO) Take 1 tablet by mouth daily.      Marland Kitchen docusate sodium (COLACE) 100 MG capsule Take 100 mg by mouth 2 (two) times daily as needed for constipation.      Marland Kitchen escitalopram (LEXAPRO) 20 MG tablet Take 1 tablet (20 mg total) by mouth daily.  60 tablet  6  . gabapentin (NEURONTIN) 300  MG capsule Take 1 capsule (300 mg total) by mouth 3 (three) times daily.  90 capsule  3  . hyaluronate sodium (RADIAPLEXRX) GEL Apply 1 application topically 2 (two) times daily. Apply  To affected skin area bid, after rad tx an in am after shower,just not 4 hours prior to rad tx      . HYDROcodone-acetaminophen (NORCO/VICODIN) 5-325 MG per tablet Take 1 tablet by mouth every 6 (six) hours as needed.  10 tablet  0  . ibuprofen (ADVIL,MOTRIN) 600 MG tablet Take 1 tablet (600 mg total) by mouth every 6 (six) hours as needed.  30 tablet  0  .  lidocaine-prilocaine (EMLA) cream Apply topically as needed.  30 g  7  . Multiple Vitamin (MULTIVITAMIN) tablet Take 1 tablet by mouth daily.      . non-metallic deodorant [name]) MISC Apply 1 application topically daily.      . valsartan-hydrochlorothiazide (DIOVAN-HCT) 320-25 MG per tablet Take 0.5 tablets by mouth daily.       . [DISCONTINUED] prochlorperazine (COMPAZINE) 10 MG tablet Take 1 tablet (10 mg total) by mouth every 6 (six) hours as needed (Nausea or vomiting).  30 tablet  1  . [DISCONTINUED] prochlorperazine (COMPAZINE) 25 MG suppository Place 1 suppository (25 mg total) rectally every 12 (twelve) hours as needed for nausea.  12 suppository  3   No current facility-administered medications for this visit.    SURGICAL HISTORY:  Past Surgical History  Procedure Laterality Date  . Anal sphincterotomy  04/2011  . Hemorrhoid surgery  04/2011    ligation  . Breast lumpectomy with needle localization Left 02/04/2013    Procedure: LEFT BREAST LUMPECTOMY WITH NEEDLE LOCALIZATION;  Surgeon: Almond Lint, MD;  Location: [facility] OR;  Service: General;  Laterality: Left;  . Axillary lymph node dissection Left 02/04/2013    Procedure: LEFT AXILLARY LYMPH NODE DISSECTION;  Surgeon: Almond Lint, MD;  Location: [facility] OR;  Service: General;  Laterality: Left;  End: 1512  . Portacath placement Right 02/04/2013    Procedure: INSERTION PORT-A-CATH;  Surgeon: Almond Lint, MD;  Location: [facility] OR;  Service: General;  Laterality: Right;  Start Time: 0981.  Marland Kitchen Appendectomy  1980  . Breast surgery      Lumpectomy in april 2014    REVIEW OF SYSTEMS:   A 10 point review of systems was conducted and is otherwise negative except for what is noted above.    PHYSICAL EXAMINATION: Blood pressure 113/77, pulse 80, temperature 98.7 F (37.1 C), temperature source Oral, resp. rate 18, height 5\' 2"  (1.575 m), weight 141 lb 3.2 oz (64.048 kg), last menstrual period 01/20/2013. Body mass index is 25.82 kg/(m^2). General:  Patient is a well appearing female in no acute distress HEENT: PERRLA, sclerae anicteric no conjunctival pallor, MMM Neck: supple, no palpable adenopathy Lungs: clear to auscultation bilaterally, no wheezes, rhonchi, or rales Cardiovascular: regular rate rhythm, S1, S2, no murmurs, rubs or gallops Abdomen: Soft, non-tender, non-distended, normoactive bowel sounds, no HSM Extremities: warm and well perfused, no clubbing, cyanosis, or edema,  Skin: No rashes, lesions, her two great toes have band-aids over the nail.  The skin on the sole of her feet is intact, it is not dry, or cracking, sensation tested and intact.   Neuro: Non-focal ECOG PERFORMANCE STATUS: 1 - Symptomatic but completely ambulatory  LABORATORY DATA: Lab Results  Component Value Date   WBC 3.3* 10/14/2013   HGB 10.7* 10/14/2013   HCT 32.8* 10/14/2013   MCV 93.6 10/14/2013  PLT 123* 10/14/2013      Chemistry      Component Value Date/Time   NA 143 10/14/2013 0929   NA 139 07/20/2013 0557   K 3.6 10/14/2013 0929   K 3.7 07/20/2013 0557   CL 106 07/20/2013 0557   CL 100 04/23/2013 1447   CO2 27 10/14/2013 0929   CO2 26 07/20/2013 0557   BUN 11.0 10/14/2013 0929   BUN 8 07/20/2013 0557   CREATININE 0.8 10/14/2013 0929   CREATININE 0.58 07/20/2013 0557      Component Value Date/Time   CALCIUM 10.0 10/14/2013 0929   CALCIUM 9.1 07/20/2013 0557   ALKPHOS 61 10/14/2013 0929   ALKPHOS 32* 07/19/2013 0717   AST 23 10/14/2013 0929   AST 20 07/19/2013 0717   ALT 16 10/14/2013 0929   ALT 28 07/19/2013 0717   BILITOT 0.48 10/14/2013 0929   BILITOT 0.2* 07/19/2013 0717       RADIOGRAPHIC STUDIES:  No results found.  ASSESSMENT: 57 year old female with new diagnosis of   #1 multifocal invasive ductal carcinoma grade 3 ER weakly positive at 3% PR negative HER-2/neu negative with Ki-67 of 90% tumor is very aggressive. She did have a palpable lymph node as well in the left axilla. Patient was seen in the multidisciplinary clinic for  discussion of treatment options.   #2 patient underwent a lumpectomy with axillary lymph node dissection with Dr. Luz Brazen on 02/04/13. A 1.6 cm tumor was removed with 1/19 lymph nodes positive for disease.    #3 s/p adjuvant chemotherapy consisting of Adriamycin Cytoxan given dose dense for total of 4 cycles followed by Taxol carboplatinum weekly for 12 weeks. Rationale for this aggressive treatment was discussed with the patient since she is essentially has a triple-negative disease.  She received 4 cycles of Adriamycin/Cytoxan, and 9 cycles of Taxol/Carbo.  Due to rash likely from Taxol it was discontinued, patient then received 2 cycles of Gemzar carbo given on a q 14 day schedule. Patient completed all of her chemotherapy on 08/21/2013   #4 peripheral neuropathy in hands and feet: Patient is on gabapentin. We will increase her dose to 300 mg 4 times a day. Prescription was sent to her pharmacy.  #5 patient is having significant problems with shoes. I have recommended that she be seen back by her podiatrist to possibly get good fitting shoes that she is having significant neuropathic pain especially when she wears normal regular shoes.  #6 patient will continue radiation therapy. She will finish this up towards the end of December.  PLAN:   #1 continue radiation therapy.  #2 increase gabapentin 300 mg 4 times a day.  #3 refer back to her podiatrist.  #4 patient will be seen back in early January for followup.  All questions were answered. The patient knows to call the clinic with any problems, questions or concerns. We can certainly see the patient much sooner if necessary.  I spent 25 minutes counseling the patient face to face. The total time spent in the appointment was 30 minutes.  Drue Second, MD Medical/Oncology [facility] [phone] (beeper) [phone] (Office)  10/14/2013, 10:32 AM

## 2013-10-14 NOTE — Progress Notes (Signed)
[facility]    Radiation Oncology [address] [place]     [name], M.D. [place], [place] [address]               [name], M.D., Ph.D. Phone: [phone]      [name], M.D. Fax: [phone]      [name], M.D., Ph.D.         [name], M.D.         [name], M.D Weekly Treatment Management Note  Name: Kristi [name]     MRN: [id number]        CSN: [id number] Date: 10/14/2013      DOB: 1956-06-28  CC: Altamese [place], MD         [name]    Status: Outpatient  Diagnosis: The encounter diagnosis was Cancer of upper-inner quadrant of female breast, left.  Current Dose: 43.2 Gy  Current Fraction: 24  Planned Dose: 63 Gy  Narrative: Kristi [name] was [name] today for weekly treatment management. The chart was checked and port films  were reviewed. She is having some discomfort along the lateral aspect of the breast and upper breast region. She will be switched to Biafine to see if this will help with her skin issues.  Review of patient's allergies indicates no known allergies.   Current Outpatient Prescriptions  Medication Sig Dispense Refill  . ALPRAZolam (XANAX) 1 MG tablet Take 2 mg by mouth at bedtime as needed for sleep.       Marland Kitchen amphetamine-dextroamphetamine (ADDERALL) 10 MG tablet Take 20 mg by mouth daily.       Marland Kitchen aspirin EC 81 MG EC tablet Take 1 tablet (81 mg total) by mouth daily.  30 tablet  0  . B Complex-C (SUPER B COMPLEX PO) Take 1 tablet by mouth daily.      Marland Kitchen docusate sodium (COLACE) 100 MG capsule Take 100 mg by mouth 2 (two) times daily as needed for constipation.      Marland Kitchen escitalopram (LEXAPRO) 20 MG tablet Take 1 tablet (20 mg total) by mouth daily.  60 tablet  6  . gabapentin (NEURONTIN) 300 MG capsule Take 1 capsule (300 mg total) by mouth 3 (three) times daily.  90 capsule  3  . hyaluronate sodium (RADIAPLEXRX) GEL Apply 1 application topically 2 (two) times daily. Apply  To affected skin area bid, after rad  tx an in am after shower,just not 4 hours prior to rad tx      . HYDROcodone-acetaminophen (NORCO/VICODIN) 5-325 MG per tablet Take 1 tablet by mouth every 6 (six) hours as needed.  10 tablet  0  . ibuprofen (ADVIL,MOTRIN) 600 MG tablet Take 1 tablet (600 mg total) by mouth every 6 (six) hours as needed.  30 tablet  0  . lidocaine-prilocaine (EMLA) cream Apply topically as needed.  30 g  7  . Multiple Vitamin (MULTIVITAMIN) tablet Take 1 tablet by mouth daily.      . non-metallic deodorant [name]) MISC Apply 1 application topically daily.      . valsartan-hydrochlorothiazide (DIOVAN-HCT) 320-25 MG per tablet Take 0.5 tablets by mouth daily.       . [DISCONTINUED] prochlorperazine (COMPAZINE) 10 MG tablet Take 1 tablet (10 mg total) by mouth every 6 (six) hours as needed (Nausea or vomiting).  30 tablet  1  . [DISCONTINUED] prochlorperazine (COMPAZINE) 25 MG suppository Place 1 suppository (25 mg total) rectally every 12 (twelve) hours as needed for  nausea.  12 suppository  3   No current facility-administered medications for this encounter.   Labs:  Lab Results  Component Value Date   WBC 3.3* 10/14/2013   HGB 10.7* 10/14/2013   HCT 32.8* 10/14/2013   MCV 93.6 10/14/2013   PLT 123* 10/14/2013   Lab Results  Component Value Date   CREATININE 0.8 10/14/2013   BUN 11.0 10/14/2013   NA 143 10/14/2013   K 3.6 10/14/2013   CL 106 07/20/2013   CO2 27 10/14/2013   Lab Results  Component Value Date   ALT 16 10/14/2013   AST 23 10/14/2013   BILITOT 0.48 10/14/2013    Physical Examination:  Filed Vitals:   10/14/13 1523  BP: 114/64  Pulse: 69  Temp: 98.1 F (36.7 C)    Wt Readings from Last 3 Encounters:  10/14/13 140 lb 11.2 oz (63.821 kg)  10/14/13 141 lb 3.2 oz (64.048 kg)  10/07/13 141 lb (63.957 kg)    The left breast area shows hyperpigmentation changes and some erythema in the upper aspect the breast but no moist desquamation Lungs - Normal respiratory effort, chest expands  symmetrically. Lungs are clear to auscultation, no crackles or wheezes.  Heart has regular rhythm and rate  Abdomen is soft and non tender with normal bowel sounds  Assessment:  Patient tolerating treatments well except for issues as above  Plan: Continue treatment per original radiation prescription

## 2013-10-14 NOTE — Progress Notes (Signed)
   Department of Radiation Oncology  Phone:  [phone] Fax:        [phone]  Electron setup check  Earlier today the patient's electron setup directed to the lumpectomy cavity within the left breast was reviewed. This set showed accurate placement compared to her planning documents.  -----------------------------------  [name], PhD, MD

## 2013-10-15 ENCOUNTER — Ambulatory Visit
Admission: RE | Admit: 2013-10-15 | Discharge: 2013-10-15 | Disposition: A | Discharge status: Home / Self Care | Payer: BC Managed Care – PPO | Source: Ambulatory Visit | Attending: Radiation Oncology | Admitting: Radiation Oncology

## 2013-10-16 ENCOUNTER — Ambulatory Visit
Admission: RE | Admit: 2013-10-16 | Discharge: 2013-10-16 | Disposition: A | Discharge status: Home / Self Care | Payer: BC Managed Care – PPO | Source: Ambulatory Visit | Attending: Radiation Oncology | Admitting: Radiation Oncology

## 2013-10-17 ENCOUNTER — Ambulatory Visit
Admission: RE | Admit: 2013-10-17 | Discharge: 2013-10-17 | Disposition: A | Discharge status: Home / Self Care | Payer: BC Managed Care – PPO | Source: Ambulatory Visit | Attending: Radiation Oncology | Admitting: Radiation Oncology

## 2013-10-20 ENCOUNTER — Ambulatory Visit
Admission: RE | Admit: 2013-10-20 | Discharge: 2013-10-20 | Disposition: A | Discharge status: Home / Self Care | Payer: BC Managed Care – PPO | Source: Ambulatory Visit | Attending: Radiation Oncology | Admitting: Radiation Oncology

## 2013-10-21 ENCOUNTER — Ambulatory Visit
Admission: RE | Admit: 2013-10-21 | Discharge: 2013-10-21 | Disposition: A | Discharge status: Home / Self Care | Payer: BC Managed Care – PPO | Source: Ambulatory Visit | Attending: Radiation Oncology | Admitting: Radiation Oncology

## 2013-10-21 VITALS — BP 130/82 | HR 72 | Temp 98.6°F | Ht 62.0 in | Wt 137.8 lb

## 2013-10-21 DIAGNOSIS — C50212 Malignant neoplasm of upper-inner quadrant of left female breast: Secondary | ICD-10-CM

## 2013-10-21 MED ORDER — HYDROCODONE-ACETAMINOPHEN 5-325 MG PO TABS: 1.0000 | ORAL_TABLET | Freq: Four times a day (QID) | ORAL | Status: DC | PRN

## 2013-10-21 NOTE — Progress Notes (Signed)
Kristi [name] has had 28 fractions to her left breast and subclavian area.  She is having pain in both of her legs and her left shoulder.  She is reporting that the gabapentin and norco are not helping her neuropathic pain in her legs.  She has 2 tables left of the norco and is wondering if this needs to be refilled or if there is something else that could help.  She is supposed to see a neurologist in the future.  The skin underneath her arm and underneath her left breast is peeling.  She also has hyperpigmentation on her left subclavian area and left neck.  She is using radiaplex gel.

## 2013-10-21 NOTE — Progress Notes (Signed)
[facility]    Radiation Oncology [address] [place]     [name], M.D. [place], [place] [address]               [name], M.D., Ph.D. Phone: [phone]      [name], M.D. Fax: [phone]      [name], M.D., Ph.D.         [name], M.D.         [name], M.D Weekly Treatment Management Note  Name: Kristi [name]     MRN: [id number]        CSN: [id number] Date: 10/21/2013      DOB: 1956-05-28  CC: Altamese [place], MD         [name]    Status: Outpatient  Diagnosis: The encounter diagnosis was Cancer of upper-inner quadrant of female breast, left.  Current Dose: 53 Gy  Current Fraction: 29  Planned Dose: 63 Gy  Narrative: [name] was seen today for weekly treatment management. The chart was checked and port films  were reviewed. She is starting to have some discomfort in the breast area. I have given her a prescription for hydrocodone concerning this issue. She has tried Tylenol and Advil/Aleve with minimal benefit.  Review of patient's allergies indicates no known allergies.  Current Outpatient Prescriptions  Medication Sig Dispense Refill  . ALPRAZolam (XANAX) 1 MG tablet Take 2 mg by mouth at bedtime as needed for sleep.       Marland Kitchen amphetamine-dextroamphetamine (ADDERALL) 10 MG tablet Take 20 mg by mouth daily.       Marland Kitchen aspirin EC 81 MG EC tablet Take 1 tablet (81 mg total) by mouth daily.  30 tablet  0  . B Complex-C (SUPER B COMPLEX PO) Take 1 tablet by mouth daily.      Marland Kitchen docusate sodium (COLACE) 100 MG capsule Take 100 mg by mouth 2 (two) times daily as needed for constipation.      Marland Kitchen escitalopram (LEXAPRO) 20 MG tablet Take 1 tablet (20 mg total) by mouth daily.  60 tablet  6  . gabapentin (NEURONTIN) 300 MG capsule Take 1 capsule (300 mg total) by mouth 3 (three) times daily.  90 capsule  3  . hyaluronate sodium (RADIAPLEXRX) GEL Apply 1 application topically 2 (two) times daily. Apply  To affected skin  area bid, after rad tx an in am after shower,just not 4 hours prior to rad tx      . HYDROcodone-acetaminophen (NORCO/VICODIN) 5-325 MG per tablet Take 1 tablet by mouth every 6 (six) hours as needed.  50 tablet  0  . ibuprofen (ADVIL,MOTRIN) 600 MG tablet Take 1 tablet (600 mg total) by mouth every 6 (six) hours as needed.  30 tablet  0  . lidocaine-prilocaine (EMLA) cream Apply topically as needed.  30 g  7  . Multiple Vitamin (MULTIVITAMIN) tablet Take 1 tablet by mouth daily.      . non-metallic deodorant [name]) MISC Apply 1 application topically daily.      . valsartan-hydrochlorothiazide (DIOVAN-HCT) 320-25 MG per tablet Take 0.5 tablets by mouth daily.       . [DISCONTINUED] prochlorperazine (COMPAZINE) 10 MG tablet Take 1 tablet (10 mg total) by mouth every 6 (six) hours as needed (Nausea or vomiting).  30 tablet  1  . [DISCONTINUED] prochlorperazine (COMPAZINE) 25 MG suppository Place 1 suppository (25 mg total) rectally every 12 (twelve) hours as needed for nausea.  12 suppository  3   No current facility-administered medications for this encounter.   Labs:  Lab Results  Component Value Date   WBC 3.3* 10/14/2013   HGB 10.7* 10/14/2013   HCT 32.8* 10/14/2013   MCV 93.6 10/14/2013   PLT 123* 10/14/2013   Lab Results  Component Value Date   CREATININE 0.8 10/14/2013   BUN 11.0 10/14/2013   NA 143 10/14/2013   K 3.6 10/14/2013   CL 106 07/20/2013   CO2 27 10/14/2013   Lab Results  Component Value Date   ALT 16 10/14/2013   AST 23 10/14/2013   BILITOT 0.48 10/14/2013    Physical Examination:  Filed Vitals:   10/21/13 1555  BP: 130/82  Pulse: 72  Temp: 98.6 F (37 C)    Wt Readings from Last 3 Encounters:  10/21/13 137 lb 12.8 oz (62.506 kg)  10/14/13 140 lb 11.2 oz (63.821 kg)  10/14/13 141 lb 3.2 oz (64.048 kg)    The left breast area shows hyperpigmentation changes and erythema without moist desquamation Lungs - Normal respiratory effort, chest expands symmetrically.  Lungs are clear to auscultation, no crackles or wheezes.  Heart has regular rhythm and rate  Abdomen is soft and non tender with normal bowel sounds  Assessment:  Patient tolerating treatments well  Plan: Continue treatment per original radiation prescription

## 2013-10-22 ENCOUNTER — Telehealth: Payer: Self-pay | Admitting: *Deleted

## 2013-10-22 ENCOUNTER — Ambulatory Visit
Admission: RE | Admit: 2013-10-22 | Discharge: 2013-10-22 | Disposition: A | Discharge status: Home / Self Care | Payer: BC Managed Care – PPO | Source: Ambulatory Visit | Attending: Radiation Oncology | Admitting: Radiation Oncology

## 2013-10-22 DIAGNOSIS — C50919 Malignant neoplasm of unspecified site of unspecified female breast: Secondary | ICD-10-CM

## 2013-10-22 NOTE — Telephone Encounter (Signed)
Pt called states the numbness in her feet is terrible, currently taking 600mg ( 2- 300mg  pills) BID. Pt wanted to know if there was anything she could take to help this. Pt followed up with her Podiatrist who gave her a shot in the left foot to help with the pain. Pt advised she was supposed to go back today for the right foot and cancelled appt. Pt inquired if she was to be referred to a Neurologist as she could  not remember if this was talked about at her last office visit. Will review with porvider and call pt with further instructions. Next f/u 11/13/13 Last seen 12/9

## 2013-10-22 NOTE — Telephone Encounter (Signed)
Please refer to neurology 

## 2013-10-22 NOTE — Telephone Encounter (Signed)
Per MD, referral to Neurology sent

## 2013-10-23 ENCOUNTER — Ambulatory Visit
Admission: RE | Admit: 2013-10-23 | Discharge: 2013-10-23 | Disposition: A | Discharge status: Home / Self Care | Payer: BC Managed Care – PPO | Source: Ambulatory Visit | Attending: Radiation Oncology | Admitting: Radiation Oncology

## 2013-10-23 ENCOUNTER — Telehealth: Payer: Self-pay | Admitting: *Deleted

## 2013-10-23 NOTE — Telephone Encounter (Signed)
sw pt informed her that kk will be on call 11/13/13. gv appt for 11/13/13 w/labs@ 8 and ov@ 8:30am. Pt is aware...td

## 2013-10-24 ENCOUNTER — Ambulatory Visit
Admission: RE | Admit: 2013-10-24 | Discharge: 2013-10-24 | Disposition: A | Discharge status: Home / Self Care | Payer: BC Managed Care – PPO | Source: Ambulatory Visit | Attending: Radiation Oncology | Admitting: Radiation Oncology

## 2013-10-27 ENCOUNTER — Ambulatory Visit
Admission: RE | Admit: 2013-10-27 | Discharge: 2013-10-27 | Disposition: A | Discharge status: Home / Self Care | Payer: BC Managed Care – PPO | Source: Ambulatory Visit | Attending: Radiation Oncology | Admitting: Radiation Oncology

## 2013-10-28 ENCOUNTER — Ambulatory Visit
Admission: RE | Admit: 2013-10-28 | Discharge: 2013-10-28 | Disposition: A | Discharge status: Home / Self Care | Payer: BC Managed Care – PPO | Source: Ambulatory Visit | Attending: Radiation Oncology | Admitting: Radiation Oncology

## 2013-10-28 ENCOUNTER — Ambulatory Visit: Payer: BC Managed Care – PPO

## 2013-10-28 VITALS — BP 119/76 | HR 80 | Temp 98.6°F | Ht 62.0 in | Wt 135.1 lb

## 2013-10-28 DIAGNOSIS — C50212 Malignant neoplasm of upper-inner quadrant of left female breast: Secondary | ICD-10-CM

## 2013-10-28 NOTE — Progress Notes (Signed)
[name]    Radiation Oncology [address] [place]     Kristi [name], M.D. [name], [place] [address]               Kristi [name], M.D., Ph.D. Phone: [phone]      Kristi [name], M.D. Fax: [phone]      Kristi [name], M.D., Ph.D.         Kristi [name], M.D.         Kristi [name], M.D Weekly Treatment Management Note  Name: Kristi [name]     MRN: [id number]        CSN: [id number] Date: 10/28/2013      DOB: [birth year]  CC: Kristi [place], MD         Kristi [name]    Status: Outpatient  Diagnosis: The encounter diagnosis was Cancer of upper-inner quadrant of female breast, left.  Current Dose: 63 Gy  Current Fraction: 34  Planned Dose: 63 Gy  Narrative: Kristi [name] was [name] today for weekly treatment management. The chart was checked and port films  were reviewed. She is happy to complete her radiation therapy today. She does have some fatigue as well as some itching and discomfort in the breast area.  Review of patient's allergies indicates no known allergies. Current Outpatient Prescriptions  Medication Sig Dispense Refill  . ALPRAZolam (XANAX) 1 MG tablet Take 2 mg by mouth at bedtime as needed for sleep.       Marland Kitchen amphetamine-dextroamphetamine (ADDERALL) 10 MG tablet Take 20 mg by mouth daily.       Marland Kitchen aspirin EC 81 MG EC tablet Take 1 tablet (81 mg total) by mouth daily.  30 tablet  0  . B Complex-C (SUPER B COMPLEX PO) Take 1 tablet by mouth daily.      Marland Kitchen docusate sodium (COLACE) 100 MG capsule Take 100 mg by mouth 2 (two) times daily as needed for constipation.      Marland Kitchen escitalopram (LEXAPRO) 20 MG tablet Take 1 tablet (20 mg total) by mouth daily.  60 tablet  6  . gabapentin (NEURONTIN) 300 MG capsule Take 1 capsule (300 mg total) by mouth 3 (three) times daily.  90 capsule  3  . hyaluronate sodium (RADIAPLEXRX) GEL Apply 1 application topically 2 (two) times daily. Apply  To affected skin area bid, after rad tx an in am after shower,just not 4  hours prior to rad tx      . HYDROcodone-acetaminophen (NORCO/VICODIN) 5-325 MG per tablet Take 1 tablet by mouth every 6 (six) hours as needed.  50 tablet  0  . ibuprofen (ADVIL,MOTRIN) 600 MG tablet Take 1 tablet (600 mg total) by mouth every 6 (six) hours as needed.  30 tablet  0  . lidocaine-prilocaine (EMLA) cream Apply topically as needed.  30 g  7  . Multiple Vitamin (MULTIVITAMIN) tablet Take 1 tablet by mouth daily.      . non-metallic deodorant [name]) MISC Apply 1 application topically daily.      . valsartan-hydrochlorothiazide (DIOVAN-HCT) 320-25 MG per tablet Take 0.5 tablets by mouth daily.       . [DISCONTINUED] prochlorperazine (COMPAZINE) 10 MG tablet Take 1 tablet (10 mg total) by mouth every 6 (six) hours as needed (Nausea or vomiting).  30 tablet  1  . [DISCONTINUED] prochlorperazine (COMPAZINE) 25 MG suppository Place 1 suppository (25 mg total) rectally every 12 (twelve) hours as needed for nausea.  12 suppository  3   No  current facility-administered medications for this encounter.   Labs:  Lab Results  Component Value Date   WBC 3.3* 10/14/2013   HGB 10.7* 10/14/2013   HCT 32.8* 10/14/2013   MCV 93.6 10/14/2013   PLT 123* 10/14/2013   Lab Results  Component Value Date   CREATININE 0.8 10/14/2013   BUN 11.0 10/14/2013   NA 143 10/14/2013   K 3.6 10/14/2013   CL 106 07/20/2013   CO2 27 10/14/2013   Lab Results  Component Value Date   ALT 16 10/14/2013   AST 23 10/14/2013   BILITOT 0.48 10/14/2013    Physical Examination:  Filed Vitals:   10/28/13 1530  BP: 119/76  Pulse: 80  Temp: 98.6 F (37 C)    Wt Readings from Last 3 Encounters:  10/28/13 135 lb 1.6 oz (61.281 kg)  10/21/13 137 lb 12.8 oz (62.506 kg)  10/14/13 140 lb 11.2 oz (63.821 kg)    The left breast area shows hyperpigmentation changes and erythema but no moist desquamation. Lungs - Normal respiratory effort, chest expands symmetrically. Lungs are clear to auscultation, no crackles or wheezes.   Heart has regular rhythm and rate  Abdomen is soft and non tender with normal bowel sounds  Assessment:  Patient tolerated treatments reasonably wel.l  Plan: Routine followup in one month.

## 2013-10-28 NOTE — Progress Notes (Signed)
Kristi [name] has had 34 fractions to her left breast.  She denies pain.  She does have fatigue.  She reports constipation.  The skin on her left breast is red.  She has healing areas on her left subclavian area, underneath her left arm and breast.  She is using radiaplex.

## 2013-10-29 ENCOUNTER — Ambulatory Visit: Payer: BC Managed Care – PPO

## 2013-11-09 ENCOUNTER — Encounter: Payer: Self-pay | Admitting: Radiation Oncology

## 2013-11-09 NOTE — Progress Notes (Signed)
  Radiation Oncology         [phone] ________________________________  Name: Kristi [name] MRN: [id number]  Date: 11/09/2013  DOB: 11/16/1955  RESPIRATORY MOTION MANAGEMENT SIMULATION - performed on 09/04/2013  NARRATIVE:  In order to account for effect of respiratory motion on target structures and other organs in the planning and delivery of radiotherapy, this patient underwent respiratory motion management simulation.  To accomplish this, when the patient was brought to the CT simulation planning suite, 4D respiratoy motion management CT images were obtained.  The CT images were loaded into the planning software.  Then, using a variety of tools including Cine, MIP, and standard views, the target volume and planning target volumes (PTV) were delineated.  Avoidance structures were contoured.  Treatment planning then occurred.  Dose volume histograms were generated and reviewed for each of the requested structure.  The resulting plan was carefully reviewed and approved today.  -----------------------------------  Blair Promise, PhD, MD

## 2013-11-09 NOTE — Progress Notes (Signed)
  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, pN1a, pMx)   Indication for treatment:  Breast conservation therapy and elective coverage of the high axilla/supraclavicular region       Radiation treatment dates:   November 4 through October 28, 2013  Site/dose:   Left breast, 45 gray in 25 fractions, the lumpectomy cavity area was boosted to a cumulative dose of 63 gray  Beams/energy:   Three-field setup initially;  tangential beams encompassing  the left breast, the high axilla and supraclavicular region was treated with a right anterior oblique field,  lumpectomy cavity boost was with a custom electron cutout field using 15 MeV electrons  Narrative: The patient tolerated radiation treatment relatively well.   She did have some fatigue towards the end of her therapy as well as some skin irritation but no moist desquamation  Plan: The patient has completed radiation treatment. The patient will return to radiation oncology clinic for routine followup in one month. I advised them to call or return sooner if they have any questions or concerns related to their recovery or treatment.  -----------------------------------  Blair Promise, PhD, MD

## 2013-11-11 ENCOUNTER — Encounter: Payer: Self-pay | Admitting: Neurology

## 2013-11-11 ENCOUNTER — Ambulatory Visit (INDEPENDENT_AMBULATORY_CARE_PROVIDER_SITE_OTHER): Payer: BC Managed Care – PPO | Admitting: Neurology

## 2013-11-11 VITALS — BP 127/82 | HR 77 | Ht 63.75 in | Wt 136.0 lb

## 2013-11-11 DIAGNOSIS — G609 Hereditary and idiopathic neuropathy, unspecified: Secondary | ICD-10-CM

## 2013-11-11 HISTORY — DX: Hereditary and idiopathic neuropathy, unspecified: G60.9

## 2013-11-11 MED ORDER — PREGABALIN 75 MG PO CAPS: 75.0000 mg | ORAL_CAPSULE | Freq: Two times a day (BID) | ORAL | Status: DC

## 2013-11-11 NOTE — Patient Instructions (Addendum)
Overall you are doing fairly well but I do want to suggest a few things today:   Your symptoms are most consistent with a diagnosis of peripheral neuropathy. This is unfortunately often seen after treatment with chemotherapy.   As far as your medications are concerned, I would like to suggest the following: 1)Stop the Gabapentin 2)Start Lyrica 75mg  twice a day. We can increase this in the future if needed  As far as diagnostic testing:  1)We will check some blood work today for possible other causes of your symptoms  I would like to see you back in 4 to 6 months, sooner if we need to. Please call us with any interim questions, concerns, problems, updates or refill requests.   Please also call us for any test results so we can go over those with you on the phone.  My clinical assistant and will answer any of your questions and relay your messages to me and also relay most of my messages to you.   Our phone number is [phone]. We also have an after hours call service for urgent matters and there is a physician on-call for urgent questions. For any emergencies you know to call 911 or go to the nearest emergency room

## 2013-11-11 NOTE — Progress Notes (Signed)
[facility]    Provider:  Dr Janann Colonel Referring Provider: Ron Parker, MD Primary Care Physician:  Kristi Parker, MD  CC:  Foot pain  HPI:  Kristi [name] is a 58 y.o. female here as a referral from Kristi. Hassell Done for evaluation of foot pain  57y/o woman with hx of breast CA, currently receiving RT with history of getting Adriamycin/cytoxan, Taxol (had allergic reaction) and Gemzar in the past. Is also s/p lumpectomy on 02/2013. Notes pain in soles of her feet, they feel like they have "shriveled up". Symptoms started towards end of chemotherapy, ended October 2014. Has progressed/worsened since chemotherapy. When she stands up gets increase in pain. Feet are very sensitive to shoes and socks. Has tried multiple different types of shoes to see if she could find a type that relieves the pain. Very sensitive to the cold. Pain is worse at night. Has caused some difficulty walking due to imbalance though she attributes the gait instability more to the pain. Currently taking Neurontin 300mg  QID. (takes 2 tablets qhs) This has given mild relief but no resolution of symptoms.   No symptoms in bilateral upper extremities.   Reviewed notes, labs and imaging from outside physicians, which were unremarkable.   Review of Systems: Out of a complete 14 system review, the patient complains of only the following symptoms, and all other reviewed systems are negative. Positive for anemia fatigue feeling hot feeling cold numbness weakness sleepiness constipation depression anxiety none of sleep racing thoughts  History   Social History  . Marital Status: Married    Spouse Name: [name]    Number of Children: 1  . Years of Education: 12   Occupational History  . [employer]   Social History Main Topics  . Smoking status: Never Smoker   . Smokeless tobacco: Never Used  . Alcohol Use: No  . Drug Use: No  . Sexual Activity: Yes   Other Topics Concern   . Not on file   Social History Narrative   Patient is married [name]) and lives at home with her husband.   Patient has one daughter.   Patient is working at [employer]   Patient has a 12th grade education.   Patient drinks very little caffeine.    Family History  Problem Relation Age of Onset  . Lung cancer Father   . Hypertension Father   . Thyroid cancer Father     dx in his 62s  . Breast cancer Paternal Aunt 77  . Colon cancer Paternal Aunt     dx in her 50x  . Cervical cancer Paternal Aunt     dzx in her 23s  . Ovarian cancer Cousin     dx in her lage 31s  . Breast cancer Cousin     maternal first cousin, once removed; dx in her late 31s  . Breast cancer Cousin     maternal first cousin once removed; dx in late 34s  . Hypertension Mother   . Diabetes Mother   . Dementia Mother   . Hypertension Brother   . Seizures Brother     Alcohol induced.  . Cancer Paternal Uncle     oral cancer  . Kidney cancer Paternal Grandmother   . Cancer Cousin     several paternal cousins with brain cancer, leukemia, and other cancers    Past Medical History  Diagnosis Date  . Anxiety   . GERD (gastroesophageal reflux disease)   .  Hypertension   . MRSA (methicillin resistant Staphylococcus aureus) 2009  . Depression   . Anemia     Iron deficinecy anemia  . Breast cancer     left    Past Surgical History  Procedure Laterality Date  . Anal sphincterotomy  04/2011  . Hemorrhoid surgery  04/2011    ligation  . Breast lumpectomy with needle localization Left 02/04/2013    Procedure: LEFT BREAST LUMPECTOMY WITH NEEDLE LOCALIZATION;  Surgeon: Kristi Klein, MD;  Location: [facility];  Service: General;  Laterality: Left;  . Axillary lymph node dissection Left 02/04/2013    Procedure: LEFT AXILLARY LYMPH NODE DISSECTION;  Surgeon: Kristi Klein, MD;  Location: [place];  Service: General;  Laterality: Left;  End: [id number]  . Portacath placement Right 02/04/2013    Procedure: INSERTION PORT-A-CATH;   Surgeon: Kristi Klein, MD;  Location: [place];  Service: General;  Laterality: Right;  Start TimeAC:[id number].  Marland Kitchen Appendectomy  1980  . Breast surgery      Lumpectomy in april 2014    Current Outpatient Prescriptions  Medication Sig Dispense Refill  . ALPRAZolam (XANAX) 1 MG tablet Take 2 mg by mouth at bedtime as needed for sleep.       Marland Kitchen amphetamine-dextroamphetamine (ADDERALL) 10 MG tablet Take 20 mg by mouth daily.       Marland Kitchen aspirin EC 81 MG EC tablet Take 1 tablet (81 mg total) by mouth daily.  30 tablet  0  . B Complex-C (SUPER B COMPLEX PO) Take 1 tablet by mouth daily.      Marland Kitchen docusate sodium (COLACE) 100 MG capsule Take 100 mg by mouth 2 (two) times daily as needed for constipation.      Marland Kitchen escitalopram (LEXAPRO) 20 MG tablet Take 1 tablet (20 mg total) by mouth daily.  60 tablet  6  . gabapentin (NEURONTIN) 300 MG capsule Take 1 capsule (300 mg total) by mouth 3 (three) times daily.  90 capsule  3  . hyaluronate sodium (RADIAPLEXRX) GEL Apply 1 application topically 2 (two) times daily. Apply  To affected skin area bid, after rad tx an in am after shower,just not 4 hours prior to rad tx      . HYDROcodone-acetaminophen (NORCO/VICODIN) 5-325 MG per tablet Take 1 tablet by mouth every 6 (six) hours as needed.  50 tablet  0  . ibuprofen (ADVIL,MOTRIN) 600 MG tablet Take 1 tablet (600 mg total) by mouth every 6 (six) hours as needed.  30 tablet  0  . Multiple Vitamin (MULTIVITAMIN) tablet Take 1 tablet by mouth daily.      . non-metallic deodorant Kristi [name]) MISC Apply 1 application topically daily.      . valsartan-hydrochlorothiazide (DIOVAN-HCT) 320-25 MG per tablet Take 0.5 tablets by mouth daily.       . [DISCONTINUED] prochlorperazine (COMPAZINE) 10 MG tablet Take 1 tablet (10 mg total) by mouth every 6 (six) hours as needed (Nausea or vomiting).  30 tablet  1  . [DISCONTINUED] prochlorperazine (COMPAZINE) 25 MG suppository Place 1 suppository (25 mg total) rectally every 12 (twelve) hours as  needed for nausea.  12 suppository  3   No current facility-administered medications for this visit.    Allergies as of 11/11/2013  . (No Known Allergies)    Vitals: BP 127/82  Pulse 77  Ht 5' 3.75" (1.619 m)  Wt 136 lb (61.689 kg)  BMI 23.53 kg/m2  LMP 01/20/2013 Last Weight:  Wt Readings from Last 1 Encounters:  11/11/13 136  lb (61.689 kg)   Last Height:   Ht Readings from Last 1 Encounters:  11/11/13 5' 3.75" (1.619 m)     Physical exam: Exam: Gen: NAD, conversant Eyes: anicteric sclerae, moist conjunctivae HENT: Atraumatic, oropharynx clear Neck: Trachea midline; supple,  Lungs: CTA, no wheezing, rales, rhonic                          CV: RRR, no MRG Abdomen: Soft, non-tender;  Extremities: No peripheral edema  Skin: Normal temperature, no rash,  Psych: Appropriate affect, pleasant  Neuro: MS: AA&Ox3, appropriately interactive, normal affect   Speech: fluent w/o paraphasic error  Memory: good recent and remote recall  CN: PERRL, EOMI no nystagmus, no ptosis, sensation intact to LT V1-V3 bilat, face symmetric, no weakness, hearing grossly intact, palate elevates symmetrically, shoulder shrug 5/5 bilat,  tongue protrudes midline, no fasiculations noted.  Motor: normal bulk and tone Strength: 5/5  In all extremities  Coord: rapid alternating and point-to-point (FNF, HTS) movements intact.  Reflexes: symmetrical, bilat downgoing toes  Sens: LT intact in all extremities, intact LT, PP, temp, vibration and proprioception in bilateral lower extremities  Gait: posture, stance, stride and arm-swing normal. Tandem gait intact. Able to walk on heels and toes. Romberg absent.   Assessment:  After physical and neurologic examination, review of laboratory studies, imaging, neurophysiology testing and pre-existing records, assessment will be reviewed on the problem list.  Plan:  Treatment plan and additional workup will be reviewed under Problem  List.  1)Peripheral neuropathy: likely chemotherapy related  Ms. Killilea is a pleasant 58 year old woman with history of breast cancer, status post chemotherapy and radiation, presenting for initial evaluation of bilateral lower extremity foot paresthesias and pain. The symptoms began shortly after she finished her course of chemotherapy. Main symptom is pain which is affecting her quality of life, causing difficulty walking. She is currently trying gabapentin 300 mg 3-4 times a day, and has tried multiple different shoes with no benefit. Physical exam is overall unremarkable. Suspect her symptoms are most likely lead to her chemotherapy. Will check lab workup for reversible causes. Do not feel EMG nerve conduction study would give much additional information at this time. Can consider in the future. Will switch patient to Lyrica 75 mg twice a day, can titrate up in the future as tolerated. Discussed with patient following up with podiatry for special foot orthotics. Patient to call office back in one month and let me know how she is doing on Lyrica. Followup in 4-6 months.  Jim Like, DO  [facility] [address] [place], [place] [address]  Phone [phone] Fax [phone]

## 2013-11-12 ENCOUNTER — Other Ambulatory Visit: Payer: Self-pay | Admitting: *Deleted

## 2013-11-12 DIAGNOSIS — C50211 Malignant neoplasm of upper-inner quadrant of right female breast: Secondary | ICD-10-CM

## 2013-11-13 ENCOUNTER — Encounter: Payer: Self-pay | Admitting: Oncology

## 2013-11-13 ENCOUNTER — Other Ambulatory Visit: Payer: BC Managed Care – PPO

## 2013-11-13 ENCOUNTER — Ambulatory Visit: Payer: BC Managed Care – PPO | Admitting: Oncology

## 2013-11-13 ENCOUNTER — Ambulatory Visit (HOSPITAL_BASED_OUTPATIENT_CLINIC_OR_DEPARTMENT_OTHER): Payer: BC Managed Care – PPO | Admitting: Oncology

## 2013-11-13 ENCOUNTER — Other Ambulatory Visit (HOSPITAL_BASED_OUTPATIENT_CLINIC_OR_DEPARTMENT_OTHER): Payer: BC Managed Care – PPO

## 2013-11-13 ENCOUNTER — Telehealth: Payer: Self-pay | Admitting: Oncology

## 2013-11-13 VITALS — BP 116/74 | HR 82 | Temp 98.2°F | Resp 18 | Ht 63.0 in | Wt 136.8 lb

## 2013-11-13 DIAGNOSIS — C50919 Malignant neoplasm of unspecified site of unspecified female breast: Secondary | ICD-10-CM

## 2013-11-13 DIAGNOSIS — C50219 Malignant neoplasm of upper-inner quadrant of unspecified female breast: Secondary | ICD-10-CM

## 2013-11-13 DIAGNOSIS — C773 Secondary and unspecified malignant neoplasm of axilla and upper limb lymph nodes: Secondary | ICD-10-CM

## 2013-11-13 DIAGNOSIS — C50211 Malignant neoplasm of upper-inner quadrant of right female breast: Secondary | ICD-10-CM

## 2013-11-13 DIAGNOSIS — G609 Hereditary and idiopathic neuropathy, unspecified: Secondary | ICD-10-CM

## 2013-11-13 DIAGNOSIS — Z170 Estrogen receptor positive status [ER+]: Secondary | ICD-10-CM

## 2013-11-13 LAB — COMPREHENSIVE METABOLIC PANEL (CC13)
ALT: 16 U/L (ref 0–55)
AST: 22 U/L (ref 5–34)
Albumin: 3.9 g/dL (ref 3.5–5.0)
Alkaline Phosphatase: 60 U/L (ref 40–150)
Anion Gap: 11 mEq/L (ref 3–11)
BILIRUBIN TOTAL: 0.39 mg/dL (ref 0.20–1.20)
BUN: 9 mg/dL (ref 7.0–26.0)
CO2: 30 mEq/L — ABNORMAL HIGH (ref 22–29)
CREATININE: 0.8 mg/dL (ref 0.6–1.1)
Calcium: 10.2 mg/dL (ref 8.4–10.4)
Chloride: 102 mEq/L (ref 98–109)
GLUCOSE: 90 mg/dL (ref 70–140)
Potassium: 3.6 mEq/L (ref 3.5–5.1)
Sodium: 144 mEq/L (ref 136–145)
Total Protein: 6.7 g/dL (ref 6.4–8.3)

## 2013-11-13 LAB — CBC WITH DIFFERENTIAL/PLATELET
BASO%: 0.2 % (ref 0.0–2.0)
BASOS ABS: 0 10*3/uL (ref 0.0–0.1)
EOS ABS: 0.1 10*3/uL (ref 0.0–0.5)
EOS%: 1.5 % (ref 0.0–7.0)
HEMATOCRIT: 36.2 % (ref 34.8–46.6)
HEMOGLOBIN: 11.7 g/dL (ref 11.6–15.9)
LYMPH%: 35.1 % (ref 14.0–49.7)
MCH: 29.4 pg (ref 25.1–34.0)
MCHC: 32.2 g/dL (ref 31.5–36.0)
MCV: 91.3 fL (ref 79.5–101.0)
MONO#: 0.4 10*3/uL (ref 0.1–0.9)
MONO%: 10.6 % (ref 0.0–14.0)
NEUT%: 52.6 % (ref 38.4–76.8)
NEUTROS ABS: 1.9 10*3/uL (ref 1.5–6.5)
PLATELETS: 124 10*3/uL — AB (ref 145–400)
RBC: 3.97 10*6/uL (ref 3.70–5.45)
RDW: 14.1 % (ref 11.2–14.5)
WBC: 3.6 10*3/uL — AB (ref 3.9–10.3)
lymph#: 1.3 10*3/uL (ref 0.9–3.3)

## 2013-11-13 MED ORDER — TAMOXIFEN CITRATE 20 MG PO TABS: 20.0000 mg | ORAL_TABLET | Freq: Every day | ORAL | Status: AC

## 2013-11-13 NOTE — Progress Notes (Signed)
Faxed clinical information to [employer] @ [phone] for patient's disability °

## 2013-11-13 NOTE — Patient Instructions (Signed)
Tamoxifen oral solution What is this medicine? TAMOXIFEN (ta MOX i fen) blocks the effects of estrogen. It is commonly used to treat breast cancer. It is also used to decrease the chance of breast cancer coming back in women who have received treatment for the disease. It may also help prevent breast cancer in women who have a high risk of developing breast cancer. This medicine may be used for other purposes; ask your health care provider or pharmacist if you have questions. COMMON BRAND NAME(S): Soltamox What should I tell my health care provider before I take this medicine? They need to know if you have any of these conditions: -blood clots -blood disease -cataracts or impaired eyesight -endometriosis -high calcium levels -high cholesterol -irregular menstrual cycles -liver disease -stroke -uterine fibroids -an unusual or allergic reaction to tamoxifen, other medicines, foods, dyes, or preservatives -pregnant or trying to get pregnant -breast-feeding How should I use this medicine? Take this medicine by mouth with a glass of water. Follow the directions on the prescription label. You can take it with or without food. Take your medicine at regular intervals. Do not take your medicine more often than directed. Do not stop taking except on your doctor's advice. A special MedGuide will be given to you by the pharmacist with each prescription and refill. Be sure to read this information carefully each time. Talk to your pediatrician regarding the use of this medicine in children. While this drug may be prescribed for selected conditions, precautions do apply. Overdosage: If you think you have taken too much of this medicine contact a poison control center or emergency room at once. NOTE: This medicine is only for you. Do not share this medicine with others. What if I miss a dose? If you miss a dose, take it as soon as you can. If it is almost time for your next dose, take only that dose. Do  not take double or extra doses. What may interact with this medicine? -aminoglutethimide -bromocriptine -chemotherapy drugs -female hormones, like estrogens and birth control pills -letrozole -medroxyprogesterone -phenobarbital -rifampin -warfarin This list may not describe all possible interactions. Give your health care provider a list of all the medicines, herbs, non-prescription drugs, or dietary supplements you use. Also tell them if you smoke, drink alcohol, or use illegal drugs. Some items may interact with your medicine. What should I watch for while using this medicine? Visit your doctor or health care professional for regular checks on your progress. You will need regular pelvic exams, breast exams, and mammograms. If you are taking this medicine to reduce your risk of getting breast cancer, you should know that this medicine does not prevent all types of breast cancer. If breast cancer or other problems occur, there is no guarantee that it will be found at an early stage. Do not become pregnant while taking this medicine or for 2 months after stopping this medicine. Stop taking this medicine if you get pregnant or think you are pregnant and contact your doctor. This medicine may harm your unborn baby. Women who can possibly become pregnant should use birth control methods that do not use hormones during tamoxifen treatment and for 2 months after therapy has stopped. Talk with your health care provider for birth control advice. Do not breast feed while taking this medicine. What side effects may I notice from receiving this medicine? Side effects that you should report to your doctor or health care professional as soon as possible: -allergic reactions like skin rash, itching  or hives, swelling of the face, lips, or tongue -breathing problems -changes in vision -changes in your menstrual cycle -difficulty walking or talking -new breast lumps -numbness -pelvic pain or  pressure -redness, blistering, peeling or loosening of the skin, including inside the mouth -sudden chest pain -swelling, pain or tenderness in your calf or leg -unusual bruising or bleeding -vaginal discharge that is bloody, brown, or rust -weakness -yellowing of the whites of the eyes or skin Side effects that usually do not require medical attention (report to your doctor or health care professional if they continue or are bothersome): -fatigue -hair loss, although uncommon and is usually mild -headache -hot flashes -impotence (in men) -nausea, vomiting (mild) -vaginal discharge (white or clear) This list may not describe all possible side effects. Call your doctor for medical advice about side effects. You may report side effects to FDA at 1-800-FDA-1088. Where should I keep my medicine? Keep out of the reach of children. Store in the original package at room temperature between 20 and 25 degrees C (68 and 77 degrees F). Do not store above 25 degrees C (77 degrees F). DO NOT freeze or refrigerate. Protect from light. Keep container tightly closed. Use within 3 months of opening. Throw away any unused medicine after the expiration date. NOTE: This sheet is a summary. It may not cover all possible information. If you have questions about this medicine, talk to your doctor, pharmacist, or health care provider.  2014, Elsevier/Gold Standard. (2008-07-20 15:48:08)

## 2013-11-13 NOTE — Progress Notes (Signed)
OFFICE PROGRESS NOTE  CC**  [name],[name], MD [address] [place] [address]  DIAGNOSIS: 58 year old female with stage IIA ER weakly positive, PR negative HER-2/neu negative, invasive ductal carcinoma of the left breast.   PRIOR THERAPY: 1. Earlier this year it on routine screening mammography the patient was noted to have a abnormality within the 12:00 position of the left breast. Patient returned for additional imaging with 3-D, tomosynthesis. Within the left breast there were 2 masses somewhat lobulated with irregular margins in the upper inner aspect of the left breast. These were separated by approximately a centimeter. On ultrasound the lesion the 11:00 position measured 1.0 x 0.7 cm. The 12:00 lesion measured 1.6 x 0.9 cm. In addition within the axillary region there was a 2.1 x 1.7 suspicious appearing lymph node.. The patient proceeded to undergo ultrasound-guided biopsy of the 2 areas within the left breast as well as the suspicious left axillary lymph node. Biopsies from both breast areas revealed invasive ductal carcinoma. In addition the lymph node from the left axilla revealed ductal carcinoma. The lesions appeared to be high-grade. There was no HER-2/neu amplification and hormone staining was minimal consistent with essentially consistent with triple negative breast cancer.  Patient's case was discussed at the multidisciplinary breast conference her pathology and radiology were reviewed.  2. Patient underwent lumpectomy of the left breast with Dr. Barry Dienes on 02/04/13, a 1.6cm tumor was removed along with an axillary lymph node dissection that found 1/19 nodes positive for metastatic disease.    3. Patient was then started on adjuvant chemotherapy on 03/05/13 with Adriamycin/Cytoxan. She began Taxol Carbo on 05/07/13, this was discontinued after 9 cycles due to rash.  She received 2 cycles of Gemzar carbo from 9/25  - 10/16.   4.  Patient began adjuvant radiation on  09/09/13 - 10/28/13  5. Adjuvant curative intent tamoxifen 20 mg daily beginning 11/13/2013. Total of 5-10 years of therapy is planned. Risks benefits and side effects discussed with the patient patient consented to starting the treatment. Information was given to her.  CURRENT THERAPY:  Tamoxifen 20 mg daily curative intent   INTERVAL HISTORY: Kristi [name] 58 y.o. female returns for followup visit today. She has completed all of radiation therapy. Overall she tolerated radiation quite well. Patient has also been seen by neurology for peripheral neuropathy. She is now on Lyrica. She's taken a total of 3 doses. She tells me that the neuropathy is improving. This is very encouraging and she is very encouraged as well. She does have ongoing fatigue. She complains of left shoulder pain secondary to radiation. But she does tell me it is improving. She is denying any headaches double vision blurring of vision fevers chills night sweats no headaches. No vaginal bleeding or discharge. No weight loss. Her appetite is stable. Remainder of the 10 point review of systems is negative.  MEDICAL HISTORY: Past Medical History  Diagnosis Date  . Anxiety   . GERD (gastroesophageal reflux disease)   . Hypertension   . MRSA (methicillin resistant Staphylococcus aureus) 2009  . Depression   . Anemia     Iron deficinecy anemia  . Breast cancer     left    ALLERGIES:  has No Known Allergies.  MEDICATIONS:  Current Outpatient Prescriptions  Medication Sig Dispense Refill  . ALPRAZolam (XANAX) 1 MG tablet Take 2 mg by mouth at bedtime as needed for sleep.       Marland Kitchen amphetamine-dextroamphetamine (ADDERALL) 10  MG tablet Take 20 mg by mouth daily.       Marland Kitchen aspirin EC 81 MG EC tablet Take 1 tablet (81 mg total) by mouth daily.  30 tablet  0  . B Complex-C (SUPER B COMPLEX PO) Take 1 tablet by mouth daily.      Marland Kitchen docusate sodium (COLACE) 100 MG capsule Take 100 mg by mouth 2 (two) times daily as needed for  constipation.      Marland Kitchen escitalopram (LEXAPRO) 20 MG tablet Take 1 tablet (20 mg total) by mouth daily.  60 tablet  6  . hyaluronate sodium (RADIAPLEXRX) GEL Apply 1 application topically 2 (two) times daily. Apply  To affected skin area bid, after rad tx an in am after shower,just not 4 hours prior to rad tx      . HYDROcodone-acetaminophen (NORCO/VICODIN) 5-325 MG per tablet Take 1 tablet by mouth every 6 (six) hours as needed.  50 tablet  0  . ibuprofen (ADVIL,MOTRIN) 600 MG tablet Take 1 tablet (600 mg total) by mouth every 6 (six) hours as needed.  30 tablet  0  . Multiple Vitamin (MULTIVITAMIN) tablet Take 1 tablet by mouth daily.      . non-metallic deodorant Jethro Poling) MISC Apply 1 application topically daily.      . pregabalin (LYRICA) 75 MG capsule Take 1 capsule (75 mg total) by mouth 2 (two) times daily.  60 capsule  6  . valsartan-hydrochlorothiazide (DIOVAN-HCT) 320-25 MG per tablet Take 0.5 tablets by mouth daily.       . [DISCONTINUED] prochlorperazine (COMPAZINE) 10 MG tablet Take 1 tablet (10 mg total) by mouth every 6 (six) hours as needed (Nausea or vomiting).  30 tablet  1  . [DISCONTINUED] prochlorperazine (COMPAZINE) 25 MG suppository Place 1 suppository (25 mg total) rectally every 12 (twelve) hours as needed for nausea.  12 suppository  3   No current facility-administered medications for this visit.    SURGICAL HISTORY:  Past Surgical History  Procedure Laterality Date  . Anal sphincterotomy  04/2011  . Hemorrhoid surgery  04/2011    ligation  . Breast lumpectomy with needle localization Left 02/04/2013    Procedure: LEFT BREAST LUMPECTOMY WITH NEEDLE LOCALIZATION;  Surgeon: Stark Klein, MD;  Location: [facility];  Service: General;  Laterality: Left;  . Axillary lymph node dissection Left 02/04/2013    Procedure: LEFT AXILLARY LYMPH NODE DISSECTION;  Surgeon: Stark Klein, MD;  Location: [facility];  Service: General;  Laterality: Left;  End: 3546  . Portacath placement Right 02/04/2013     Procedure: INSERTION PORT-A-CATH;  Surgeon: Stark Klein, MD;  Location: [facility];  Service: General;  Laterality: Right;  Start Time: 5681.  Marland Kitchen Appendectomy  1980  . Breast surgery      Lumpectomy in april 2014    REVIEW OF SYSTEMS:   A 10 point review of systems was conducted and is otherwise negative except for what is noted above.    PHYSICAL EXAMINATION: Blood pressure 116/74, pulse 82, temperature 98.2 F (36.8 C), temperature source Oral, resp. rate 18, height 5' 3"  (1.6 m), weight 136 lb 12.8 oz (62.052 kg), last menstrual period 01/20/2013. Body mass index is 24.24 kg/(m^2). General: Patient is a well appearing female in no acute distress HEENT: PERRLA, sclerae anicteric no conjunctival pallor, MMM Neck: supple, no palpable adenopathy Lungs: clear to auscultation bilaterally, no wheezes, rhonchi, or rales Cardiovascular: regular rate rhythm, S1, S2, no murmurs, rubs or gallops Abdomen: Soft, non-tender, non-distended, normoactive bowel sounds,  no HSM Extremities: warm and well perfused, no clubbing, cyanosis, or edema,  Skin: No rashes, lesions, her two great toes have band-aids over the nail.  The skin on the sole of her feet is intact, it is not dry, or cracking, sensation tested and intact.   Neuro: Non-focal ECOG PERFORMANCE STATUS: 1 - Symptomatic but completely ambulatory  LABORATORY DATA: Lab Results  Component Value Date   WBC 3.6* 11/13/2013   HGB 11.7 11/13/2013   HCT 36.2 11/13/2013   MCV 91.3 11/13/2013   PLT 124* 11/13/2013      Chemistry      Component Value Date/Time   NA 144 11/13/2013 0814   NA 139 07/20/2013 0557   K 3.6 11/13/2013 0814   K 3.7 07/20/2013 0557   CL 106 07/20/2013 0557   CL 100 04/23/2013 1447   CO2 30* 11/13/2013 0814   CO2 26 07/20/2013 0557   BUN 9.0 11/13/2013 0814   BUN 8 07/20/2013 0557   CREATININE 0.8 11/13/2013 0814   CREATININE 0.58 07/20/2013 0557      Component Value Date/Time   CALCIUM 10.2 11/13/2013 0814   CALCIUM 9.1 07/20/2013 0557    ALKPHOS 60 11/13/2013 0814   ALKPHOS 32* 07/19/2013 0717   AST 22 11/13/2013 0814   AST 20 07/19/2013 0717   ALT 16 11/13/2013 0814   ALT 28 07/19/2013 0717   BILITOT 0.39 11/13/2013 0814   BILITOT 0.2* 07/19/2013 0717       RADIOGRAPHIC STUDIES:  No results found.  ASSESSMENT: 58 year old female with new diagnosis of   #1 multifocal invasive ductal carcinoma grade 3 ER weakly positive at 3% PR negative HER-2/neu negative with Ki-67 of 90% tumor is very aggressive. She did have a palpable lymph node as well in the left axilla. Patient was seen in the multidisciplinary clinic for discussion of treatment options.   #2 patient underwent a lumpectomy with axillary lymph node dissection with Dr. Bradd Burner on 02/04/13. A 1.6 cm tumor was removed with 1/19 lymph nodes positive for disease.    #3 s/p adjuvant chemotherapy consisting of Adriamycin Cytoxan given dose dense for total of 4 cycles followed by Taxol carboplatinum weekly for 12 weeks. Rationale for this aggressive treatment was discussed with the patient since she is essentially has a triple-negative disease.  She received 4 cycles of Adriamycin/Cytoxan, and 9 cycles of Taxol/Carbo.  Due to rash likely from Taxol it was discontinued, patient then received 2 cycles of Gemzar carbo given on a q 14 day schedule. Patient completed all of her chemotherapy on 08/21/2013   #4 peripheral neuropathy in hands and feet: Patient is now on Lyrica started by neurology. She has followup with neurology in about 4 months time. She will also be getting orthotic type issues. Because of the neuropathy patient is not able to return to work Aw visit to her today and it is faxed to her place of work.  #5 because her tumor was ER positive at about 3-6% I will begin her on tamoxifen. We discussed the rationale for this. She is in agreement with it. Prescriptions were given and sent to her pharmacy.   PLAN:   #1 proceed with tamoxifen 20 mg daily  #2 keep appointment with  podiatry for orthotics.  #3 continue Lyrica as prescribed.  #4 patient will be seen back in 3 months time for followup. All questions were answered. The patient knows to call the clinic with any problems, questions or concerns. We can certainly see the  patient much sooner if necessary.  I spent 20 minutes counseling the patient face to face. The total time spent in the appointment was 30 minutes.  Marcy Panning, MD Medical/Oncology [facility] [phone] (beeper) [phone] (Office)  11/13/2013, 9:00 AM

## 2013-11-13 NOTE — Telephone Encounter (Signed)
, °

## 2013-11-14 LAB — METHYLMALONIC ACID, SERUM: Methylmalonic Acid: 132 nmol/L (ref 0–378)

## 2013-11-14 LAB — IRON AND TIBC
IRON SATURATION: 23 % (ref 15–55)
Iron: 64 ug/dL (ref 35–155)
TIBC: 278 ug/dL (ref 250–450)
UIBC: 214 ug/dL (ref 150–375)

## 2013-11-14 LAB — FERRITIN: Ferritin: 104 ng/mL (ref 15–150)

## 2013-11-14 LAB — VITAMIN B12: Vitamin B-12: 1908 pg/mL — ABNORMAL HIGH (ref 211–946)

## 2013-11-14 LAB — TSH: TSH: 0.918 u[IU]/mL (ref 0.450–4.500)

## 2013-11-27 ENCOUNTER — Telehealth: Payer: Self-pay | Admitting: Neurology

## 2013-11-27 MED ORDER — GABAPENTIN 400 MG PO CAPS: 400.0000 mg | ORAL_CAPSULE | Freq: Three times a day (TID) | ORAL | Status: DC

## 2013-11-27 NOTE — Telephone Encounter (Signed)
Call returned. Please see phone note for further details.

## 2013-11-27 NOTE — Telephone Encounter (Signed)
Called patient back to discuss her concerns. Will discontinue the Lyrica. She was tolerating the Gabapentin well so will try her on a higher dose to see if there is more of a symptomatic benefit. Will start 400mg  TID of gabapentin.

## 2013-11-27 NOTE — Telephone Encounter (Signed)
Called patient and patient stated that she was experiencing emotional problems and that she had to take a xanax to calm her down. Patient believes that this is coming from the medication Lyrica that Dr. Janann Colonel put her on. I advised the patient that if she has any other problems, questions or concerns to call the office. Patient verbalized understanding. Patient would like a call back on her cell phone at [phone]. Please advise.

## 2013-11-27 NOTE — Telephone Encounter (Signed)
Patient states she has been having weird feelings and has been feeling nervous ever since she was given Lyrica. Please call the patient to advise.

## 2013-11-28 ENCOUNTER — Encounter: Payer: Self-pay | Admitting: Oncology

## 2013-11-28 ENCOUNTER — Ambulatory Visit (INDEPENDENT_AMBULATORY_CARE_PROVIDER_SITE_OTHER): Payer: BC Managed Care – PPO | Admitting: General Surgery

## 2013-12-01 ENCOUNTER — Ambulatory Visit
Admission: RE | Admit: 2013-12-01 | Discharge: 2013-12-01 | Disposition: A | Discharge status: Home / Self Care | Payer: BC Managed Care – PPO | Source: Ambulatory Visit | Attending: Radiation Oncology | Admitting: Radiation Oncology

## 2013-12-01 ENCOUNTER — Encounter: Payer: Self-pay | Admitting: Radiation Oncology

## 2013-12-01 VITALS — BP 130/72 | HR 74 | Temp 98.2°F | Ht 63.0 in | Wt 149.0 lb

## 2013-12-01 DIAGNOSIS — C50219 Malignant neoplasm of upper-inner quadrant of unspecified female breast: Secondary | ICD-10-CM

## 2013-12-01 NOTE — Progress Notes (Signed)
Radiation Oncology         [phone] ________________________________  Name: Kristi [name] MRN: [id number]  Date: 12/01/2013  DOB: [birth year]  Follow-Up Visit Note  CC: Ron Parker, MD  Deatra Robinson, MD  Diagnosis:   Multifocal invasive ductal carcinoma of the left breast (mpT1c, pN1a, pMx)   Interval Since Last Radiation:  1  months  Narrative:  The patient returns today for routine follow-up.  She is making good progress. She has minimal fatigue at this time. She does notice some persistent swelling in the breast which causes some mild discomfort. She denies any nipple discharge or bleeding. She denies the problems with swelling in her left arm or hand.                              ALLERGIES:  has No Known Allergies.  Meds: Current Outpatient Prescriptions  Medication Sig Dispense Refill  . ALPRAZolam (XANAX) 1 MG tablet Take 2 mg by mouth at bedtime as needed for sleep.       Marland Kitchen amphetamine-dextroamphetamine (ADDERALL) 10 MG tablet Take 20 mg by mouth daily.       Marland Kitchen aspirin EC 81 MG EC tablet Take 1 tablet (81 mg total) by mouth daily.  30 tablet  0  . B Complex-C (SUPER B COMPLEX PO) Take 1 tablet by mouth daily.      Marland Kitchen docusate sodium (COLACE) 100 MG capsule Take 100 mg by mouth 2 (two) times daily as needed for constipation.      Marland Kitchen escitalopram (LEXAPRO) 20 MG tablet Take 1 tablet (20 mg total) by mouth daily.  60 tablet  6  . gabapentin (NEURONTIN) 400 MG capsule Take 1 capsule (400 mg total) by mouth 3 (three) times daily.  90 capsule  6  . ibuprofen (ADVIL,MOTRIN) 600 MG tablet Take 1 tablet (600 mg total) by mouth every 6 (six) hours as needed.  30 tablet  0  . Multiple Vitamin (MULTIVITAMIN) tablet Take 1 tablet by mouth daily.      . tamoxifen (NOLVADEX) 20 MG tablet Take 1 tablet (20 mg total) by mouth daily.  90 tablet  12  . valsartan-hydrochlorothiazide (DIOVAN-HCT) 320-25 MG per tablet Take 0.5 tablets by mouth daily.       . [DISCONTINUED]  prochlorperazine (COMPAZINE) 10 MG tablet Take 1 tablet (10 mg total) by mouth every 6 (six) hours as needed (Nausea or vomiting).  30 tablet  1  . [DISCONTINUED] prochlorperazine (COMPAZINE) 25 MG suppository Place 1 suppository (25 mg total) rectally every 12 (twelve) hours as needed for nausea.  12 suppository  3   No current facility-administered medications for this encounter.    Physical Findings: The patient is in no acute distress. Patient is alert and oriented.  height is 5\' 3"  (1.6 m) and weight is 149 lb (67.586 kg). Her temperature is 98.2 F (36.8 C). Her blood pressure is 130/72 and her pulse is 74. Marland Kitchen  No palpable supraclavicular or axillary adenopathy. The lungs are clear to auscultation. The heart has a regular rhythm and rate. Examination right breast reveals to be large and pendulous without mass or nipple discharge. Examination of left breast reveals it to be large and pendulous without mass or nipple discharge. There hyperpigmentation changes noted and some edema. There is no dominant mass appreciated in the breast nipple discharge or bleeding.  Lab Findings: Lab Results  Component Value Date   WBC 3.6*  11/13/2013   HGB 11.7 11/13/2013   HCT 36.2 11/13/2013   MCV 91.3 11/13/2013   PLT 124* 11/13/2013      Radiographic Findings: No results found.  Impression:  The patient is recovering from the effects of radiation.  No evidence of recurrence on clinical exam today  Plan:  Routine followup in 6 months  ____________________________________ Blair Promise, MD

## 2013-12-01 NOTE — Progress Notes (Signed)
Kristi [name] here for a 1 month fu s/p radiation therapy to her left breast and left [place] region.  She continues to have hyperpigmentation in the left  region and is concerned about this.  Informed her that it may several more months for this to disappear, but this is normal.  She also admits to fatigue, but this is "gradually getting better.

## 2013-12-12 ENCOUNTER — Encounter (INDEPENDENT_AMBULATORY_CARE_PROVIDER_SITE_OTHER): Payer: Self-pay | Admitting: General Surgery

## 2013-12-12 ENCOUNTER — Ambulatory Visit (INDEPENDENT_AMBULATORY_CARE_PROVIDER_SITE_OTHER): Payer: BC Managed Care – PPO | Admitting: General Surgery

## 2013-12-12 ENCOUNTER — Other Ambulatory Visit: Payer: Self-pay | Admitting: Neurology

## 2013-12-12 ENCOUNTER — Encounter (INDEPENDENT_AMBULATORY_CARE_PROVIDER_SITE_OTHER): Payer: Self-pay

## 2013-12-12 ENCOUNTER — Telehealth: Payer: Self-pay | Admitting: Neurology

## 2013-12-12 VITALS — BP 124/80 | HR 80 | Resp 16 | Ht 63.0 in | Wt 133.8 lb

## 2013-12-12 DIAGNOSIS — M25519 Pain in unspecified shoulder: Secondary | ICD-10-CM

## 2013-12-12 DIAGNOSIS — M25512 Pain in left shoulder: Secondary | ICD-10-CM

## 2013-12-12 DIAGNOSIS — C50219 Malignant neoplasm of upper-inner quadrant of unspecified female breast: Secondary | ICD-10-CM

## 2013-12-12 HISTORY — DX: Pain in unspecified shoulder: M25.519

## 2013-12-12 MED ORDER — GABAPENTIN 600 MG PO TABS: 600.0000 mg | ORAL_TABLET | Freq: Three times a day (TID) | ORAL | Status: DC

## 2013-12-12 NOTE — Assessment & Plan Note (Signed)
No clinical evidence of disease.  I suspect that a lot of her hyperpigmentation will improve.  She does appear to have some edema in her breast which should also improve after her radiation changes continue to regress.  I will see her back in approximately 6 months. It appears that her left mammogram would be due in March. She did have a diagnostic mammogram on the right in the fall to evaluate for potential nodule. She will need to get back on schedule for the left breast.

## 2013-12-12 NOTE — Telephone Encounter (Signed)
Called patient back. Counseled her that I do not feel [employer] is worth it if she is experiencing severe multi day pain. Will try to increase gabapentin to 600mg  TID. Initially will just increase AM dose to 600 and continue 400mg  for noon and evening dose. Follow up as needed.

## 2013-12-12 NOTE — Telephone Encounter (Addendum)
Spoke with patient and she said that she went to be fitted for shoes from the [facility] ([facility]), and she tried their stimulation of nerves for neuropathy pain, she tried on Monday and the pain has gotten worse,did not check with Dr Janann Colonel first. She would like his opinion on continuing the therapy treatment.    She is still taking gabapentin and the pain was a little better.

## 2013-12-12 NOTE — Progress Notes (Signed)
HISTORY: Patient is a 58 year old female approximately 10 months status post left lumpectomy and axillary lymph node dissection for a multifocal left breast cancer. She continues to have a hard time emotionally. She is again very tearful. She completed her radiation. 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 She is undergoing physical therapy for this.   PERTINENT REVIEW OF SYSTEMS: Otherwise negative.    Filed Vitals:   12/12/13 1206  BP: 124/80  Pulse: 80  Resp: 16   Filed Weights   12/12/13 1206  Weight: 133 lb 12.8 oz (60.691 kg)    EXAM: Head: Normocephalic and atraumatic.  Eyes:  Conjunctivae are normal. Pupils are equal, round, and reactive to light. No scleral icterus.  Neck:  Normal range of motion. Neck supple. No tracheal deviation present. No thyromegaly present.  Resp: No respiratory distress, normal effort. Breast:  Left breast has some hyperpigmentation and skin edema.   No masses palpable.  Contracture of left axilla has improved, but is still present. She has tenderness of breast and superior shoulder.     Abd:  Abdomen is soft, non distended and non tender. No masses are palpable.  There is no rebound and no guarding.  Neurological: Alert and oriented to person, place, and time. Coordination normal.  Skin: Skin is warm and dry. No rash noted. No diaphoretic. No erythema. No pallor.  Psychiatric: Normal mood and affect. Normal behavior. Judgment and thought content normal.      ASSESSMENT AND PLAN:   Left shoulder pain with abduction The patient has been undergoing physical therapy for left axillary contracture. However, a significant amount of her pain is at the superior aspect of her shoulder near the acromion and just lateral.  I will refer her her to orthopedics to evaluate her for potential rotator cuff injury.  I suspect that she will likely still need physical therapy. However, if there is something that can be done to help her I would  like to find out. I also would like to know whether she would need different physical therapy exercises.  Cancer of upper-inner quadrant of female breast No clinical evidence of disease.  I suspect that a lot of her hyperpigmentation will improve.  She does appear to have some edema in her breast which should also improve after her radiation changes continue to regress.  I will see her back in approximately 6 months. It appears that her left mammogram would be due in March. She did have a diagnostic mammogram on the right in the fall to evaluate for potential nodule. She will need to get back on schedule for the left breast.       Milus Height, MD Surgical Oncology, [facility]  Ron Parker, MD Ron Parker, MD

## 2013-12-12 NOTE — Telephone Encounter (Signed)
Patient has peripheral neuropathy and after therapy they really hurt and she wants to know if this is normal before she goes for another treatment. Please call to advise.

## 2013-12-12 NOTE — Assessment & Plan Note (Signed)
The patient has been undergoing physical therapy for left axillary contracture. However, a significant amount of her pain is at the superior aspect of her shoulder near the acromion and just lateral.  I will refer her her to orthopedics to evaluate her for potential rotator cuff injury.  I suspect that she will likely still need physical therapy. However, if there is something that can be done to help her I would like to find out. I also would like to know whether she would need different physical therapy exercises.

## 2013-12-15 ENCOUNTER — Telehealth (INDEPENDENT_AMBULATORY_CARE_PROVIDER_SITE_OTHER): Payer: Self-pay | Admitting: *Deleted

## 2013-12-15 NOTE — Telephone Encounter (Signed)
Pt returned my call and below appt information was given.  She is agreeable with this appt.

## 2013-12-15 NOTE — Telephone Encounter (Signed)
LMOM for pt to return my call.  I was calling to notify her of the appt with Dr. Marlou Sa at [facility] on 12/16/13 at 3:15pm.  Their address and phone number are below:  [address], [place]

## 2013-12-19 ENCOUNTER — Other Ambulatory Visit: Payer: Self-pay

## 2013-12-19 ENCOUNTER — Telehealth: Payer: Self-pay

## 2013-12-19 NOTE — Telephone Encounter (Signed)
Advised patient that scheduling would be calling her back with an appointment.  POF was filled out and sent to scheduling.

## 2013-12-19 NOTE — Telephone Encounter (Signed)
Message copied by Marian Sorrow on Fri Dec 19, 2013  1:39 PM ------      Message from: Deatra Robinson      Created: Fri Dec 19, 2013 12:58 PM       She can come and see Mendel Ryder next week for follow up            Thanks      KK      ----- Message -----         From: Marian Sorrow, LPN         Sent: 6/96/2952  12:26 PM           To: Deatra Robinson, MD            Patient saw Dr Barry Dienes 12/12/13 and Dr Marlou Sa with orthopedics 12/1013 about shoulder pain.  She is also seeing Dr Janann Colonel in neurology for neuropathy.  She is still complaining of shoulder pain and states her disability runs out 01/03/14 and wants to come back in to see Dr Humphrey Rolls to see if she should be out longer. Last Radiation was 10/29/13, last Gemzar/Carbo 10/14.  Recently switched from Lyrica back to gabapentin because she stated she couldn't handle the lyrica.  Does she need an earlier appointment with oncology or should she stay with ortho and neuro.  Please advise. Thank you.        ------

## 2013-12-22 ENCOUNTER — Telehealth: Payer: Self-pay | Admitting: Oncology

## 2013-12-22 ENCOUNTER — Other Ambulatory Visit: Payer: Self-pay | Admitting: Orthopedic Surgery

## 2013-12-22 DIAGNOSIS — M25512 Pain in left shoulder: Secondary | ICD-10-CM

## 2013-12-22 NOTE — Telephone Encounter (Signed)
, °

## 2013-12-24 ENCOUNTER — Ambulatory Visit: Payer: BC Managed Care – PPO

## 2013-12-24 ENCOUNTER — Other Ambulatory Visit (HOSPITAL_BASED_OUTPATIENT_CLINIC_OR_DEPARTMENT_OTHER): Payer: BC Managed Care – PPO

## 2013-12-24 ENCOUNTER — Encounter: Payer: Self-pay | Admitting: Adult Health

## 2013-12-24 ENCOUNTER — Ambulatory Visit (HOSPITAL_BASED_OUTPATIENT_CLINIC_OR_DEPARTMENT_OTHER): Payer: BC Managed Care – PPO | Admitting: Adult Health

## 2013-12-24 ENCOUNTER — Telehealth: Payer: Self-pay | Admitting: Oncology

## 2013-12-24 VITALS — BP 129/80 | HR 90 | Temp 98.4°F | Resp 18 | Ht 63.0 in | Wt 138.0 lb

## 2013-12-24 DIAGNOSIS — G609 Hereditary and idiopathic neuropathy, unspecified: Secondary | ICD-10-CM

## 2013-12-24 DIAGNOSIS — M25519 Pain in unspecified shoulder: Secondary | ICD-10-CM

## 2013-12-24 DIAGNOSIS — C50219 Malignant neoplasm of upper-inner quadrant of unspecified female breast: Secondary | ICD-10-CM

## 2013-12-24 DIAGNOSIS — C50211 Malignant neoplasm of upper-inner quadrant of right female breast: Secondary | ICD-10-CM

## 2013-12-24 DIAGNOSIS — Z95828 Presence of other vascular implants and grafts: Secondary | ICD-10-CM

## 2013-12-24 LAB — CBC WITH DIFFERENTIAL/PLATELET
BASO%: 0.1 % (ref 0.0–2.0)
BASOS ABS: 0 10*3/uL (ref 0.0–0.1)
EOS ABS: 0 10*3/uL (ref 0.0–0.5)
EOS%: 0.9 % (ref 0.0–7.0)
HEMATOCRIT: 35.2 % (ref 34.8–46.6)
HGB: 11.3 g/dL — ABNORMAL LOW (ref 11.6–15.9)
LYMPH%: 36 % (ref 14.0–49.7)
MCH: 28.7 pg (ref 25.1–34.0)
MCHC: 32.2 g/dL (ref 31.5–36.0)
MCV: 89.1 fL (ref 79.5–101.0)
MONO#: 0.3 10*3/uL (ref 0.1–0.9)
MONO%: 7.7 % (ref 0.0–14.0)
NEUT#: 2 10*3/uL (ref 1.5–6.5)
NEUT%: 55.3 % (ref 38.4–76.8)
PLATELETS: 123 10*3/uL — AB (ref 145–400)
RBC: 3.95 10*6/uL (ref 3.70–5.45)
RDW: 14.8 % — ABNORMAL HIGH (ref 11.2–14.5)
WBC: 3.5 10*3/uL — AB (ref 3.9–10.3)
lymph#: 1.3 10*3/uL (ref 0.9–3.3)

## 2013-12-24 LAB — COMPREHENSIVE METABOLIC PANEL (CC13)
ALT: 13 U/L (ref 0–55)
AST: 21 U/L (ref 5–34)
Albumin: 3.7 g/dL (ref 3.5–5.0)
Alkaline Phosphatase: 54 U/L (ref 40–150)
Anion Gap: 9 mEq/L (ref 3–11)
BUN: 13.6 mg/dL (ref 7.0–26.0)
CALCIUM: 9.7 mg/dL (ref 8.4–10.4)
CHLORIDE: 103 meq/L (ref 98–109)
CO2: 31 mEq/L — ABNORMAL HIGH (ref 22–29)
Creatinine: 0.8 mg/dL (ref 0.6–1.1)
Glucose: 97 mg/dl (ref 70–140)
POTASSIUM: 3.6 meq/L (ref 3.5–5.1)
Sodium: 142 mEq/L (ref 136–145)
Total Bilirubin: 0.36 mg/dL (ref 0.20–1.20)
Total Protein: 6.2 g/dL — ABNORMAL LOW (ref 6.4–8.3)

## 2013-12-24 MED ORDER — HEPARIN SOD (PORK) LOCK FLUSH 100 UNIT/ML IV SOLN
500.0000 [IU] | Freq: Once | INTRAVENOUS | Status: AC
  Administered 2013-12-24: 500 [IU] via INTRAVENOUS
  Filled 2013-12-24: qty 5

## 2013-12-24 MED ORDER — SODIUM CHLORIDE 0.9 % IJ SOLN
10.0000 mL | INTRAMUSCULAR | Status: DC | PRN
  Administered 2013-12-24: 10 mL via INTRAVENOUS
  Filled 2013-12-24: qty 10

## 2013-12-24 NOTE — Patient Instructions (Signed)

## 2013-12-24 NOTE — Telephone Encounter (Signed)
, °

## 2013-12-24 NOTE — Progress Notes (Signed)
OFFICE PROGRESS NOTE  CC**  Kristi [name],[name], MD [address], [address] [place] [address]  DIAGNOSIS: 58 year old female with stage IIA ER weakly positive, PR negative HER-2/neu negative, invasive ductal carcinoma of the left breast.   PRIOR THERAPY: 1. Earlier this year it on routine screening mammography the patient was noted to have a abnormality within the 12:00 position of the left breast. Patient returned for additional imaging with 3-[name], tomosynthesis. Within the left breast there were 2 masses somewhat lobulated with irregular margins in the upper inner aspect of the left breast. These were separated by approximately a centimeter. On ultrasound the lesion the 11:00 position measured 1.0 x 0.7 cm. The 12:00 lesion measured 1.6 x 0.9 cm. In addition within the axillary region there was a 2.1 x 1.7 suspicious appearing lymph node.. The patient proceeded to undergo ultrasound-guided biopsy of the 2 areas within the left breast as well as the suspicious left axillary lymph node. Biopsies from both breast areas revealed invasive ductal carcinoma. In addition the lymph node from the left axilla revealed ductal carcinoma. The lesions appeared to be high-grade. There was no HER-2/neu amplification and hormone staining was minimal consistent with essentially consistent with triple negative breast cancer.  Patient's case was discussed at the multidisciplinary breast conference her pathology and radiology were reviewed.  2. Patient underwent lumpectomy of the left breast with Dr. Barry Dienes on 02/04/13, a 1.6cm tumor was removed along with an axillary lymph node dissection that found 1/19 nodes positive for metastatic disease.    3. Patient was then started on adjuvant chemotherapy on 03/05/13 with Adriamycin/Cytoxan. She began Taxol Carbo on 05/07/13, this was discontinued after 9 cycles due to rash.  She received 2 cycles of Gemzar carbo from 9/25  - 10/16.   4.  Patient is s/p adjuvant radiation  on 09/09/13 - 10/28/13  5. Adjuvant curative intent tamoxifen 20 mg daily beginning 11/13/2013. Total of 5-10 years of therapy is planned. Risks benefits and side effects discussed with the patient patient consented to starting the treatment. Information was given to her.  CURRENT THERAPY:  Tamoxifen 20 mg daily curative intent   INTERVAL HISTORY: Kristi [name] 58 y.o. female returns for followup visit today. She is doing well.  She is here to request a letter to be sent to her insurance company about her difficulties with her shoulders and her feet to her insurance company.  She would like to return to work in 2 months.  She was referred to orthopedics by Dr. Barry Dienes for evaluation of a possible rotator cuff injury.  She has an upcoming MRI of her shoulder.  She tells me that she is planning on calling the orthopedist's office to request an MRI of both shoulders, as they are both hurting.  She is also getting specialized shoes for her bilateral foot sensitivity.  She continues to take Tamoxifen daily.  She is tolerating this medication relatively well.  She otherwise denies any other problems/concerns.    MEDICAL HISTORY: Past Medical History  Diagnosis Date  . Anxiety   . GERD (gastroesophageal reflux disease)   . Hypertension   . MRSA (methicillin resistant Staphylococcus aureus) 2009  . Depression   . Anemia     Iron deficinecy anemia  . Breast cancer     left  . History of radiation therapy 09/09/13-10/28/13    45 gray to left breast, lumpectomy cavity boosted to 63 gray    ALLERGIES:  has No Known Allergies.  MEDICATIONS:  Current Outpatient Prescriptions  Medication Sig Dispense Refill  . ALPRAZolam (XANAX) 1 MG tablet Take 2 mg by mouth at bedtime as needed for sleep.       Marland Kitchen amphetamine-dextroamphetamine (ADDERALL) 10 MG tablet Take 20 mg by mouth daily.       Marland Kitchen aspirin EC 81 MG EC tablet Take 1 tablet (81 mg total) by mouth daily.  30 tablet  0  . B Complex-C (SUPER B COMPLEX  PO) Take 1 tablet by mouth daily.      Marland Kitchen docusate sodium (COLACE) 100 MG capsule Take 100 mg by mouth 2 (two) times daily as needed for constipation.      Marland Kitchen escitalopram (LEXAPRO) 20 MG tablet Take 1 tablet (20 mg total) by mouth daily.  60 tablet  6  . gabapentin (NEURONTIN) 400 MG capsule Take 1 capsule (400 mg total) by mouth 3 (three) times daily.  90 capsule  6  . gabapentin (NEURONTIN) 600 MG tablet Take 1 tablet (600 mg total) by mouth 3 (three) times daily.  90 tablet  6  . ibuprofen (ADVIL,MOTRIN) 600 MG tablet Take 1 tablet (600 mg total) by mouth every 6 (six) hours as needed.  30 tablet  0  . Multiple Vitamin (MULTIVITAMIN) tablet Take 1 tablet by mouth daily.      . valsartan-hydrochlorothiazide (DIOVAN-HCT) 320-25 MG per tablet Take 0.5 tablets by mouth daily.       . [DISCONTINUED] prochlorperazine (COMPAZINE) 10 MG tablet Take 1 tablet (10 mg total) by mouth every 6 (six) hours as needed (Nausea or vomiting).  30 tablet  1  . [DISCONTINUED] prochlorperazine (COMPAZINE) 25 MG suppository Place 1 suppository (25 mg total) rectally every 12 (twelve) hours as needed for nausea.  12 suppository  3   No current facility-administered medications for this visit.    SURGICAL HISTORY:  Past Surgical History  Procedure Laterality Date  . Anal sphincterotomy  04/2011  . Hemorrhoid surgery  04/2011    ligation  . Breast lumpectomy with needle localization Left 02/04/2013    Procedure: LEFT BREAST LUMPECTOMY WITH NEEDLE LOCALIZATION;  Surgeon: Stark Klein, MD;  Location: [facility];  Service: General;  Laterality: Left;  . Axillary lymph node dissection Left 02/04/2013    Procedure: LEFT AXILLARY LYMPH NODE DISSECTION;  Surgeon: Stark Klein, MD;  Location: [facility];  Service: General;  Laterality: Left;  End: 1287  . Portacath placement Right 02/04/2013    Procedure: INSERTION PORT-A-CATH;  Surgeon: Stark Klein, MD;  Location: [facility];  Service: General;  Laterality: Right;  Start Time: 8676.  Marland Kitchen  Appendectomy  1980  . Breast surgery      Lumpectomy in april 2014    REVIEW OF SYSTEMS:   A 10 point review of systems was conducted and is otherwise negative except for what is noted above.    PHYSICAL EXAMINATION: Blood pressure 129/80, pulse 90, temperature 98.4 F (36.9 C), temperature source Oral, resp. rate 18, height 5' 3" (1.6 m), weight 138 lb (62.596 kg), last menstrual period 01/20/2013. Body mass index is 24.45 kg/(m^2). General: Patient is a well appearing female in no acute distress HEENT: PERRLA, sclerae anicteric no conjunctival pallor, MMM Neck: supple, no palpable adenopathy Lungs: clear to auscultation bilaterally, no wheezes, rhonchi, or rales Cardiovascular: regular rate rhythm, S1, S2, no murmurs, rubs or gallops Abdomen: Soft, non-tender, non-distended, normoactive bowel sounds, no HSM Extremities: warm and well perfused, no clubbing, cyanosis, or edema,  Skin: No rashes, lesions. Neuro: Non-focal  ECOG PERFORMANCE STATUS: 1 - Symptomatic but completely ambulatory  LABORATORY DATA: Lab Results  Component Value Date   WBC 3.5* 12/24/2013   HGB 11.3* 12/24/2013   HCT 35.2 12/24/2013   MCV 89.1 12/24/2013   PLT 123* 12/24/2013      Chemistry      Component Value Date/Time   NA 142 12/24/2013 1024   NA 139 07/20/2013 0557   K 3.6 12/24/2013 1024   K 3.7 07/20/2013 0557   CL 106 07/20/2013 0557   CL 100 04/23/2013 1447   CO2 31* 12/24/2013 1024   CO2 26 07/20/2013 0557   BUN 13.6 12/24/2013 1024   BUN 8 07/20/2013 0557   CREATININE 0.8 12/24/2013 1024   CREATININE 0.58 07/20/2013 0557      Component Value Date/Time   CALCIUM 9.7 12/24/2013 1024   CALCIUM 9.1 07/20/2013 0557   ALKPHOS 54 12/24/2013 1024   ALKPHOS 32* 07/19/2013 0717   AST 21 12/24/2013 1024   AST 20 07/19/2013 0717   ALT 13 12/24/2013 1024   ALT 28 07/19/2013 0717   BILITOT 0.36 12/24/2013 1024   BILITOT 0.2* 07/19/2013 0717       RADIOGRAPHIC STUDIES:  No results found.  ASSESSMENT:  58 year old female with new diagnosis of   #1 multifocal invasive ductal carcinoma grade 3 ER weakly positive at 3% PR negative HER-2/neu negative with Ki-67 of 90% tumor is very aggressive. She did have a palpable lymph node as well in the left axilla. Patient was seen in the multidisciplinary clinic for discussion of treatment options.   #2 patient underwent a lumpectomy with axillary lymph node dissection with Dr. Bradd Burner on 02/04/13. A 1.6 cm tumor was removed with 1/19 lymph nodes positive for disease.    #3 s/p adjuvant chemotherapy consisting of Adriamycin Cytoxan given dose dense for total of 4 cycles followed by Taxol carboplatinum weekly for 12 weeks. Rationale for this aggressive treatment was discussed with the patient since she is essentially has a triple-negative disease.  She received 4 cycles of Adriamycin/Cytoxan, and 9 cycles of Taxol/Carbo.  Due to rash likely from Taxol it was discontinued, patient then received 2 cycles of Gemzar carbo given on a q 14 day schedule. Patient completed all of her chemotherapy on 08/21/2013   #4 peripheral neuropathy in hands and feet: Patient is now on Lyrica started by neurology. She has followup with neurology in about 4 months time. She will also be getting orthotic type issues. Because of the neuropathy patient is not able to return to work Aw visit to her today and it is faxed to her place of work.  #5 because her tumor was ER positive at about 3-6% I will begin her on tamoxifen. We discussed the rationale for this. She is in agreement with it. Prescriptions were given and sent to her pharmacy.   PLAN:   #1 Patient will continue tamoxifen daily.  I will write a letter to her [facility] and inform them of her difficulties.  She will keep her appt for the MRI of her shoulder and f/u with ortho as recommended.  #2 She will return as scheduled In April for f/u with Dr. Humphrey Rolls.   I spent 25 minutes counseling the patient face to  face. The total time spent in the appointment was 30 minutes.  Minette Headland, [place] [phone] 12/25/2013, 1:35 PM

## 2013-12-29 ENCOUNTER — Ambulatory Visit
Admission: RE | Admit: 2013-12-29 | Discharge: 2013-12-29 | Disposition: A | Discharge status: Home / Self Care | Payer: BC Managed Care – PPO | Source: Ambulatory Visit | Attending: Orthopedic Surgery | Admitting: Orthopedic Surgery

## 2013-12-29 DIAGNOSIS — M25512 Pain in left shoulder: Secondary | ICD-10-CM

## 2014-01-02 ENCOUNTER — Encounter: Payer: Self-pay | Admitting: Oncology

## 2014-01-02 ENCOUNTER — Telehealth: Payer: Self-pay

## 2014-01-02 NOTE — Telephone Encounter (Signed)
Pt requested letter for work/insurance from [employer].  Letter faxed to Oswaldo Milian, mailed to patient, Kristi [name], and [place] per pt request.  Copy to scanning.

## 2014-01-02 NOTE — Progress Notes (Signed)
Faxed clinical information to [facility] @ [phone] for patient's disability case # [id number]

## 2014-01-02 NOTE — Progress Notes (Signed)
Report received from [facility] Dr. Marlou Sa.  Copy given to [employer].  Original sent for scanning.  TKF

## 2014-01-06 ENCOUNTER — Telehealth: Payer: Self-pay | Admitting: Oncology

## 2014-01-06 NOTE — Telephone Encounter (Signed)
, °

## 2014-01-12 DIAGNOSIS — F332 Major depressive disorder, recurrent severe without psychotic features: Secondary | ICD-10-CM | POA: Insufficient documentation

## 2014-01-12 HISTORY — DX: Major depressive disorder, recurrent severe without psychotic features: F33.2

## 2014-01-14 ENCOUNTER — Telehealth: Payer: Self-pay | Admitting: Neurology

## 2014-01-14 NOTE — Telephone Encounter (Signed)
Pt called and stated that she is having problems with her toes drawing up, not being as flexible, twitching and it is painful. She isn't sure if this is something related to her condition or if this is something new.  Please call to discuss.  Thank you.

## 2014-01-15 ENCOUNTER — Other Ambulatory Visit: Payer: Self-pay | Admitting: Neurology

## 2014-01-15 MED ORDER — GABAPENTIN 800 MG PO TABS: 800.0000 mg | ORAL_TABLET | Freq: Three times a day (TID) | ORAL | Status: DC

## 2014-01-15 NOTE — Telephone Encounter (Signed)
Please call her back and let her know the prescription was sent to her pharmacy but it will be for 800mg  three times a day not 900mg . Thanks

## 2014-01-15 NOTE — Telephone Encounter (Signed)
Called patient and patient stated that she was in so much pain that she took 900 mg of gabapentin last night and again this morning and the pain is better. I spoke with Dr. Janann Colonel concerning this matter and Dr. Janann Colonel stated that he would send in a prescription for gabapentin 900 mg TID, since the patient is tolerating well and it seem to take care of the pain. I relayed the message to the patient and advised the patient to give the office a call to let us know how the medication is working for her. Patient verbalized understanding.

## 2014-01-15 NOTE — Telephone Encounter (Signed)
Called and left VM message of Dr Hazle Quant note below

## 2014-01-15 NOTE — Telephone Encounter (Signed)
Pt called to check the status of her message.  She stated that yesterday was pretty rough.  She said that last night she took 900 mg of the gabapentin and her xanax and she slept really well.  She took 900 mg again his morning to see if there would be any relief.  Please call.  Thank you

## 2014-01-20 ENCOUNTER — Encounter: Payer: Self-pay | Admitting: Obstetrics

## 2014-02-11 ENCOUNTER — Other Ambulatory Visit (HOSPITAL_COMMUNITY): Payer: Self-pay | Admitting: Orthopedic Surgery

## 2014-02-12 ENCOUNTER — Encounter (HOSPITAL_COMMUNITY): Payer: Self-pay | Admitting: Pharmacy Technician

## 2014-02-19 ENCOUNTER — Ambulatory Visit: Payer: BC Managed Care – PPO | Admitting: Oncology

## 2014-02-19 ENCOUNTER — Other Ambulatory Visit: Payer: Self-pay

## 2014-02-19 ENCOUNTER — Encounter (HOSPITAL_COMMUNITY): Payer: Self-pay

## 2014-02-19 ENCOUNTER — Ambulatory Visit (HOSPITAL_COMMUNITY)
Admission: RE | Admit: 2014-02-19 | Discharge: 2014-02-19 | Disposition: A | Discharge status: Home / Self Care | Payer: BC Managed Care – PPO | Source: Ambulatory Visit | Attending: Anesthesiology | Admitting: Anesthesiology

## 2014-02-19 ENCOUNTER — Other Ambulatory Visit: Payer: BC Managed Care – PPO

## 2014-02-19 ENCOUNTER — Encounter (HOSPITAL_COMMUNITY)
Admission: RE | Admit: 2014-02-19 | Discharge: 2014-02-19 | Disposition: A | Discharge status: Home / Self Care | Payer: BC Managed Care – PPO | Source: Ambulatory Visit | Attending: Orthopedic Surgery | Admitting: Orthopedic Surgery

## 2014-02-19 DIAGNOSIS — C50219 Malignant neoplasm of upper-inner quadrant of unspecified female breast: Secondary | ICD-10-CM

## 2014-02-19 DIAGNOSIS — Z01818 Encounter for other preprocedural examination: Secondary | ICD-10-CM | POA: Insufficient documentation

## 2014-02-19 DIAGNOSIS — Z01812 Encounter for preprocedural laboratory examination: Secondary | ICD-10-CM | POA: Insufficient documentation

## 2014-02-19 HISTORY — DX: Unspecified osteoarthritis, unspecified site: M19.90

## 2014-02-19 HISTORY — DX: Polyneuropathy, unspecified: G62.9

## 2014-02-19 LAB — BASIC METABOLIC PANEL
BUN: 12 mg/dL (ref 6–23)
CHLORIDE: 100 meq/L (ref 96–112)
CO2: 30 mEq/L (ref 19–32)
Calcium: 9.8 mg/dL (ref 8.4–10.5)
Creatinine, Ser: 0.71 mg/dL (ref 0.50–1.10)
GFR calc non Af Amer: >90 mL/min (ref >90)
GLUCOSE: 95 mg/dL (ref 70–99)
POTASSIUM: 3.9 meq/L (ref 3.7–5.3)
Sodium: 141 mEq/L (ref 137–147)

## 2014-02-19 LAB — CBC
HEMATOCRIT: 32.4 % — AB (ref 36.0–46.0)
HEMOGLOBIN: 10.7 g/dL — AB (ref 12.0–15.0)
MCH: 29 pg (ref 26.0–34.0)
MCHC: 33 g/dL (ref 30.0–36.0)
MCV: 87.8 fL (ref 78.0–100.0)
Platelets: 124 10*3/uL — ABNORMAL LOW (ref 150–400)
RBC: 3.69 MIL/uL — ABNORMAL LOW (ref 3.87–5.11)
RDW: 15.5 % (ref 11.5–15.5)
WBC: 3.8 10*3/uL — ABNORMAL LOW (ref 4.0–10.5)

## 2014-02-19 LAB — SURGICAL PCR SCREEN
MRSA, PCR: NEGATIVE
Staphylococcus aureus: NEGATIVE

## 2014-02-19 NOTE — Pre-Procedure Instructions (Signed)
Kristi [name]  02/19/2014   Your procedure is scheduled on:  Tuesday, April 21st  Report to Admitting at 1130 AM.  Call this number if you have problems the morning of surgery: [phone]   Remember:   Do not eat food or drink liquids after midnight.   Take these medicines the morning of surgery with A SIP OF WATER: cymbalta, neurontin  Stop taking aspirin, over the counter vitamins/supplements/herbal medications, NSAIDS (ibuprofen, advil aleve) as of today.   Do not wear jewelry, make-up or nail polish.  Do not wear lotions, powders, or perfumes. You may wear deodorant.  Do not shave 48 hours prior to surgery. Men may shave face and neck.  Do not bring valuables to the hospital.  [facility] is not responsible  for any belongings or valuables.               Contacts, dentures or bridgework may not be worn into surgery.  Leave suitcase in the car. After surgery it may be brought to your room.  For patients admitted to the hospital, discharge time is determined by your treatment team.               Patients discharged the day of surgery will not be allowed to drive home.  Please read over the following fact sheets that you were given: Pain Booklet, Coughing and Deep Breathing and Surgical Site Infection Prevention [facility] - Preparing for Surgery  Before surgery, you can play an important role.  Because skin is not sterile, your skin needs to be as free of germs as possible.  You can reduce the number of germs on you skin by washing with CHG (chlorahexidine gluconate) soap before surgery.  CHG is an antiseptic cleaner which kills germs and bonds with the skin to continue killing germs even after washing.  Please DO NOT use if you have an allergy to CHG or antibacterial soaps.  If your skin becomes reddened/irritated stop using the CHG and inform your nurse when you arrive at Short Stay.  Do not shave (including legs and underarms) for at least 48 hours prior to the first CHG  shower.  You may shave your face.  Please follow these instructions carefully:   1.  Shower with CHG Soap the night before surgery and the morning of Surgery.  2.  If you choose to wash your hair, wash your hair first as usual with your normal shampoo.  3.  After you shampoo, rinse your hair and body thoroughly to remove the shampoo.  4.  Use CHG as you would any other liquid soap.  You can apply CHG directly to the skin and wash gently with scrungie or a clean washcloth.  5.  Apply the CHG Soap to your body ONLY FROM THE NECK DOWN.  Do not use on open wounds or open sores.  Avoid contact with your eyes, ears, mouth and genitals (private parts).  Wash genitals (private parts) with your normal soap.  6.  Wash thoroughly, paying special attention to the area where your surgery will be performed.  7.  Thoroughly rinse your body with warm water from the neck down.  8.  DO NOT shower/wash with your normal soap after using and rinsing off the CHG Soap.  9.  Pat yourself dry with a clean towel.            10.  Wear clean pajamas.            11.  Place clean sheets on your bed the night of your first shower and do not sleep with pets.  Day of Surgery  Do not apply any lotions/deoderants the morning of surgery.  Please wear clean clothes to the hospital/surgery center.

## 2014-02-19 NOTE — Progress Notes (Signed)
Primary physician - dr. Hassell Done - [facility]  Does not have cardiologist Sept 2014 ekg and echo in epic

## 2014-02-20 ENCOUNTER — Ambulatory Visit: Payer: BC Managed Care – PPO

## 2014-02-20 ENCOUNTER — Encounter: Payer: Self-pay | Admitting: Physician Assistant

## 2014-02-20 ENCOUNTER — Ambulatory Visit (HOSPITAL_BASED_OUTPATIENT_CLINIC_OR_DEPARTMENT_OTHER): Payer: BC Managed Care – PPO | Admitting: Physician Assistant

## 2014-02-20 ENCOUNTER — Telehealth: Payer: Self-pay | Admitting: Adult Health

## 2014-02-20 ENCOUNTER — Encounter: Payer: BC Managed Care – PPO | Admitting: Adult Health

## 2014-02-20 ENCOUNTER — Other Ambulatory Visit (HOSPITAL_BASED_OUTPATIENT_CLINIC_OR_DEPARTMENT_OTHER): Payer: BC Managed Care – PPO

## 2014-02-20 VITALS — BP 125/65 | HR 80 | Temp 97.6°F | Resp 16

## 2014-02-20 DIAGNOSIS — C773 Secondary and unspecified malignant neoplasm of axilla and upper limb lymph nodes: Secondary | ICD-10-CM

## 2014-02-20 DIAGNOSIS — C50919 Malignant neoplasm of unspecified site of unspecified female breast: Secondary | ICD-10-CM

## 2014-02-20 DIAGNOSIS — G609 Hereditary and idiopathic neuropathy, unspecified: Secondary | ICD-10-CM

## 2014-02-20 DIAGNOSIS — C50219 Malignant neoplasm of upper-inner quadrant of unspecified female breast: Secondary | ICD-10-CM

## 2014-02-20 DIAGNOSIS — R232 Flushing: Secondary | ICD-10-CM

## 2014-02-20 DIAGNOSIS — Z95828 Presence of other vascular implants and grafts: Secondary | ICD-10-CM

## 2014-02-20 DIAGNOSIS — T451X5A Adverse effect of antineoplastic and immunosuppressive drugs, initial encounter: Secondary | ICD-10-CM

## 2014-02-20 DIAGNOSIS — M25512 Pain in left shoulder: Secondary | ICD-10-CM

## 2014-02-20 HISTORY — DX: Flushing: R23.2

## 2014-02-20 LAB — CBC WITH DIFFERENTIAL/PLATELET
BASO%: 0.4 % (ref 0.0–2.0)
Basophils Absolute: 0 10*3/uL (ref 0.0–0.1)
EOS ABS: 0.1 10*3/uL (ref 0.0–0.5)
EOS%: 2.9 % (ref 0.0–7.0)
HCT: 31.6 % — ABNORMAL LOW (ref 34.8–46.6)
HGB: 10.3 g/dL — ABNORMAL LOW (ref 11.6–15.9)
LYMPH%: 32.9 % (ref 14.0–49.7)
MCH: 29.1 pg (ref 25.1–34.0)
MCHC: 32.7 g/dL (ref 31.5–36.0)
MCV: 89 fL (ref 79.5–101.0)
MONO#: 0.3 10*3/uL (ref 0.1–0.9)
MONO%: 9.6 % (ref 0.0–14.0)
NEUT%: 54.2 % (ref 38.4–76.8)
NEUTROS ABS: 1.9 10*3/uL (ref 1.5–6.5)
PLATELETS: 132 10*3/uL — AB (ref 145–400)
RBC: 3.55 10*6/uL — ABNORMAL LOW (ref 3.70–5.45)
RDW: 15.7 % — ABNORMAL HIGH (ref 11.2–14.5)
WBC: 3.5 10*3/uL — ABNORMAL LOW (ref 3.9–10.3)
lymph#: 1.1 10*3/uL (ref 0.9–3.3)

## 2014-02-20 LAB — COMPREHENSIVE METABOLIC PANEL (CC13)
ALT: 34 U/L (ref 0–55)
AST: 29 U/L (ref 5–34)
Albumin: 3.5 g/dL (ref 3.5–5.0)
Alkaline Phosphatase: 68 U/L (ref 40–150)
Anion Gap: 9 mEq/L (ref 3–11)
BILIRUBIN TOTAL: 0.29 mg/dL (ref 0.20–1.20)
BUN: 12.1 mg/dL (ref 7.0–26.0)
CO2: 28 mEq/L (ref 22–29)
Calcium: 9.5 mg/dL (ref 8.4–10.4)
Chloride: 105 mEq/L (ref 98–109)
Creatinine: 0.8 mg/dL (ref 0.6–1.1)
GLUCOSE: 114 mg/dL (ref 70–140)
Potassium: 3.9 mEq/L (ref 3.5–5.1)
SODIUM: 141 meq/L (ref 136–145)
Total Protein: 6.6 g/dL (ref 6.4–8.3)

## 2014-02-20 MED ORDER — HEPARIN SOD (PORK) LOCK FLUSH 100 UNIT/ML IV SOLN
500.0000 [IU] | Freq: Once | INTRAVENOUS | Status: AC
  Administered 2014-02-20: 500 [IU] via INTRAVENOUS
  Filled 2014-02-20: qty 5

## 2014-02-20 MED ORDER — SODIUM CHLORIDE 0.9 % IJ SOLN
10.0000 mL | INTRAMUSCULAR | Status: DC | PRN
  Administered 2014-02-20: 10 mL via INTRAVENOUS
  Filled 2014-02-20: qty 10

## 2014-02-20 NOTE — Patient Instructions (Signed)

## 2014-02-20 NOTE — Telephone Encounter (Signed)
, °

## 2014-02-20 NOTE — Progress Notes (Signed)
OFFICE PROGRESS NOTE  CC: Dr. Stark Klein  Dr. Doree Fudge, MD [address] [name] [address]  DIAGNOSIS: 58 year old female with stage IIA ER weakly positive, PR negative HER-2/neu negative, invasive ductal carcinoma of the left breast.   PRIOR THERAPY: 1. In March 2014, on routine screening mammography the patient was noted to have a abnormality within the 12:00 position of the left breast. Patient returned for additional imaging with 3-D, tomosynthesis. Within the left breast there were 2 masses somewhat lobulated with irregular margins in the upper inner aspect of the left breast. These were separated by approximately a centimeter. On ultrasound the lesion the 11:00 position measured 1.0 x 0.7 cm. The 12:00 lesion measured 1.6 x 0.9 cm. In addition within the axillary region there was a 2.1 x 1.7 suspicious appearing lymph node.. The patient proceeded to undergo ultrasound-guided biopsy of the 2 areas within the left breast as well as the suspicious left axillary lymph node. Biopsies from both breast areas revealed invasive ductal carcinoma. In addition the lymph node from the left axilla revealed ductal carcinoma. The lesions appeared to be high-grade. There was no HER-2/neu amplification and hormone staining was minimal consistent with essentially consistent with triple negative breast cancer.  Patient's case was discussed at the multidisciplinary breast conference her pathology and radiology were reviewed.  2. Patient underwent lumpectomy of the left breast with Dr. Barry Dienes on 02/04/13, a 1.6cm tumor was removed along with an axillary lymph node dissection that found 1/19 nodes positive for metastatic disease.    3. Patient was then started on adjuvant chemotherapy on 03/05/13 with Adriamycin/Cytoxan. She began Taxol Carbo on 05/07/13, this was discontinued after 9 cycles due to rash.  She received 2 cycles of Gemzar carbo from 9/25  - 10/16.   4.   Patient is s/p adjuvant radiation on 09/09/13 - 10/28/13  5. Adjuvant curative intent tamoxifen, 20 mg daily, beginning 11/13/2013. Total of 5-10 years of therapy is planned.   CURRENT THERAPY:  Tamoxifen 20 mg daily, curative intent   INTERVAL HISTORY: Kristi [name] 58 y.o. female returns for followup visit today. She is doing well and is seen today for routine followup to evaluate her tolerance of tamoxifen which she began in January 2015. She continues to have hot flashes, primarily at night. She's already taking gabapentin for her peripheral neuropathy, and overall tells me that the hot flashes are tolerable. Her activity is somewhat limited secondary to neuropathy in her feet which is stable. She does have some stiffness in her knees and the mornings when she wakes up, but this improves with movement throughout the day.   She is scheduled for her rotator cuff repair next week under the care of Dr. Marlou Sa. She has limited range of motion in her left upper extremity due to pain in the left shoulder, and this is the result of a fall with injury in 2014.   MEDICAL HISTORY: Past Medical History  Diagnosis Date  . Anxiety   . Hypertension   . MRSA (methicillin resistant Staphylococcus aureus) 2009  . Depression   . Anemia     Iron deficinecy anemia  . History of radiation therapy 09/09/13-10/28/13    45 gray to left breast, lumpectomy cavity boosted to 63 gray  . Neuropathy   . Arthritis   . Breast cancer     left    ALLERGIES:  has No Known Allergies.  MEDICATIONS:  Current Outpatient Prescriptions  Medication Sig Dispense Refill  . ALPRAZolam (XANAX) 1 MG tablet Take 2 mg by mouth at bedtime as needed for sleep.       Marland Kitchen amphetamine-dextroamphetamine (ADDERALL) 20 MG tablet Take 20 mg by mouth daily.      Marland Kitchen aspirin EC 81 MG EC tablet Take 1 tablet (81 mg total) by mouth daily.  30 tablet  0  . B Complex-C (SUPER B COMPLEX PO) Take 1 tablet by mouth daily.      . DULoxetine  (CYMBALTA) 60 MG capsule Take 60 mg by mouth 2 (two) times daily.      Marland Kitchen gabapentin (NEURONTIN) 800 MG tablet Take 1 tablet (800 mg total) by mouth 3 (three) times daily.  90 tablet  6  . HYDROcodone-acetaminophen (NORCO/VICODIN) 5-325 MG per tablet Take 1 tablet by mouth every 6 (six) hours as needed for moderate pain.      Marland Kitchen ibuprofen (ADVIL,MOTRIN) 600 MG tablet Take 600 mg by mouth every 6 (six) hours as needed for moderate pain.      Marland Kitchen L-Methylfolate-Algae-B12-B6 (METANX) 3-90.314-2-35 MG CAPS       . Multiple Vitamin (MULTIVITAMIN) tablet Take 1 tablet by mouth daily.      . tamoxifen (NOLVADEX) 20 MG tablet Take 20 mg by mouth daily.      . valsartan-hydrochlorothiazide (DIOVAN-HCT) 320-25 MG per tablet Take 0.5 tablets by mouth daily.       . [DISCONTINUED] prochlorperazine (COMPAZINE) 10 MG tablet Take 1 tablet (10 mg total) by mouth every 6 (six) hours as needed (Nausea or vomiting).  30 tablet  1  . [DISCONTINUED] prochlorperazine (COMPAZINE) 25 MG suppository Place 1 suppository (25 mg total) rectally every 12 (twelve) hours as needed for nausea.  12 suppository  3   No current facility-administered medications for this visit.    SURGICAL HISTORY:  Past Surgical History  Procedure Laterality Date  . Anal sphincterotomy  04/2011  . Hemorrhoid surgery  04/2011    ligation  . Breast lumpectomy with needle localization Left 02/04/2013    Procedure: LEFT BREAST LUMPECTOMY WITH NEEDLE LOCALIZATION;  Surgeon: Stark Klein, MD;  Location: [facility];  Service: General;  Laterality: Left;  . Axillary lymph node dissection Left 02/04/2013    Procedure: LEFT AXILLARY LYMPH NODE DISSECTION;  Surgeon: Stark Klein, MD;  Location: [facility];  Service: General;  Laterality: Left;  End: 5859  . Portacath placement Right 02/04/2013    Procedure: INSERTION PORT-A-CATH;  Surgeon: Stark Klein, MD;  Location: [facility];  Service: General;  Laterality: Right;  Start Time: 2924.  Marland Kitchen Appendectomy  1980  . Breast surgery       Lumpectomy in april 2014    REVIEW OF SYSTEMS:   Review of Systems  Constitutional: Negative for fever and chills.  HENT: Negative.   Eyes: Negative.   Respiratory: Negative.   Cardiovascular: Negative.   Gastrointestinal: Negative.   Genitourinary: Negative.   Musculoskeletal: Positive for joint pain. Negative for back pain and myalgias.  Skin: Negative for rash.  Neurological: Positive for tingling and sensory change. Negative for dizziness, seizures and weakness.  Endo/Heme/Allergies: Negative.   Psychiatric/Behavioral: Negative.      PHYSICAL EXAMINATION: Middle-aged [place] American female who appears comfortable and is in no acute distress  VITALS:   Blood pressure: 125/75 (right arm, sitting) Pulse: 80 Respirations: 16 Temp: 97.6 Height: 5 feet 2 inches Weight: 144 pounds BMI: 26.4 ECoG: 1  Physical Exam: HEENT:  Sclerae anicteric. Conjunctiva pink. Oropharynx clear and moist.  Neck supple, trachea midline. No thyromegaly. NODES:  No cervical, supraclavicular, or axillary lymphadenopathy palpated.  BREAST EXAM: Right breast is unremarkable. Left breast is status post lumpectomy and radiation therapy. No suspicious nodularities. There are skin changes and hyperpigmentation consistent with radiation changes, but no suspicious rashes or lesions. No evidence of local recurrence. LUNGS:  Clear to auscultation bilaterally with good excursion in.  No wheezes or rhonchi HEART:  Regular rate and rhythm. No murmur appreciated. ABDOMEN:  Soft, nontender.  No organomegaly or masses palpated. Positive bowel sounds.  MSK:  No focal spinal tenderness to palpation. Limited range of motion in the left upper chest remedies secondary to shoulder pain EXTREMITIES:  No peripheral edema.  No lymphedema in the left upper extremity. SKIN:  Benign with no visible rashes. No excessive ecchymoses. No petechiae. No pallor. NEURO:  Nonfocal. Well oriented.  Appropriate  affect.    LABORATORY DATA: Lab Results  Component Value Date   WBC 3.5* 02/20/2014   HGB 10.3* 02/20/2014   HCT 31.6* 02/20/2014   MCV 89.0 02/20/2014   PLT 132* 02/20/2014      Chemistry      Component Value Date/Time   NA 141 02/20/2014 1324   NA 141 02/19/2014 1117   K 3.9 02/20/2014 1324   K 3.9 02/19/2014 1117   CL 100 02/19/2014 1117   CL 100 04/23/2013 1447   CO2 28 02/20/2014 1324   CO2 30 02/19/2014 1117   BUN 12.1 02/20/2014 1324   BUN 12 02/19/2014 1117   CREATININE 0.8 02/20/2014 1324   CREATININE 0.71 02/19/2014 1117      Component Value Date/Time   CALCIUM 9.5 02/20/2014 1324   CALCIUM 9.8 02/19/2014 1117   ALKPHOS 68 02/20/2014 1324   ALKPHOS 32* 07/19/2013 0717   AST 29 02/20/2014 1324   AST 20 07/19/2013 0717   ALT 34 02/20/2014 1324   ALT 28 07/19/2013 0717   BILITOT 0.29 02/20/2014 1324   BILITOT 0.2* 07/19/2013 0717       RADIOGRAPHIC STUDIES:  Most recent bilateral mammogram on 01/19/2014 at [facility] showed no mammographic evidence of malignancy.   ASSESSMENT: 58 year old female with new diagnosis of   #1 multifocal invasive ductal carcinoma grade 3 ER weakly positive at 3% PR negative HER-2/neu negative with Ki-67 of 90% tumor is very aggressive. She did have a palpable lymph node as well in the left axilla. Patient was seen in the multidisciplinary clinic for discussion of treatment options.   #2 patient underwent a lumpectomy with axillary lymph node dissection with Dr. Bradd Burner on 02/04/13. A 1.6 cm tumor was removed with 1/19 lymph nodes positive for disease.    #3 s/p adjuvant chemotherapy consisting of Adriamycin Cytoxan given dose dense for total of 4 cycles followed by Taxol carboplatinum weekly for 12 weeks. Rationale for this aggressive treatment was discussed with the patient since she is essentially has a triple-negative disease.  She received 4 cycles of Adriamycin/Cytoxan, and 9 cycles of Taxol/Carbo.  Due to rash likely from Taxol it was discontinued,  patient then received 2 cycles of Gemzar carbo given on a q 14 day schedule. Patient completed all of her chemotherapy on 08/21/2013   #4 peripheral neuropathy in hands and feet: Patient is now on Lyrica started by neurology.    #5  left rotator cuff tear, status post fall in late 2014, scheduled to be repaired by Dr. Marlou Sa in April 2015  #5 on tamoxifen, 20 mg daily, with good tolerance  PLAN:   #  1 Patient will continue tamoxifen, 20 mg daily  #2 She will proceed with surgery as scheduled under the care of Dr. Marlou Sa to repair a left rotator cuff tear on 02/24/2014.   #3  she will see her surgeon, Dr. Barry Dienes, and approximately 6 weeks for routine followup and to discuss port removal.   #4  she'll return in July for followup including labs and physical exam with  Dr. Humphrey Rolls.    The majority of our 20 minute appointment today was spent counseling the patient with regards to her diagnosis, reviewing her concerns and complaints, discussing her current treatment, and coordinating care.  Durene Fruits [place] [phone] 02/20/2014, 4:34 PM

## 2014-02-22 NOTE — Progress Notes (Signed)
This encounter was created in error - please disregard.

## 2014-02-23 MED ORDER — CEFAZOLIN SODIUM-DEXTROSE 2-3 GM-% IV SOLR
2.0000 g | INTRAVENOUS | Status: AC
  Administered 2014-02-24: 2 g via INTRAVENOUS
  Filled 2014-02-23: qty 50

## 2014-02-24 ENCOUNTER — Encounter (HOSPITAL_COMMUNITY)
Admission: RE | Disposition: A | Discharge status: Home / Self Care | Payer: Self-pay | Source: Ambulatory Visit | Attending: Orthopedic Surgery

## 2014-02-24 ENCOUNTER — Ambulatory Visit (HOSPITAL_COMMUNITY): Payer: BC Managed Care – PPO | Admitting: Anesthesiology

## 2014-02-24 ENCOUNTER — Encounter (HOSPITAL_COMMUNITY): Payer: BC Managed Care – PPO | Admitting: Anesthesiology

## 2014-02-24 ENCOUNTER — Observation Stay (HOSPITAL_COMMUNITY)
Admission: RE | Admit: 2014-02-24 | Discharge: 2014-02-26 | Disposition: A | Discharge status: Home / Self Care | Payer: BC Managed Care – PPO | Source: Ambulatory Visit | Attending: Orthopedic Surgery | Admitting: Orthopedic Surgery

## 2014-02-24 DIAGNOSIS — Z8614 Personal history of Methicillin resistant Staphylococcus aureus infection: Secondary | ICD-10-CM | POA: Insufficient documentation

## 2014-02-24 DIAGNOSIS — C50919 Malignant neoplasm of unspecified site of unspecified female breast: Secondary | ICD-10-CM | POA: Insufficient documentation

## 2014-02-24 DIAGNOSIS — S46019A Strain of muscle(s) and tendon(s) of the rotator cuff of unspecified shoulder, initial encounter: Secondary | ICD-10-CM | POA: Insufficient documentation

## 2014-02-24 DIAGNOSIS — I10 Essential (primary) hypertension: Secondary | ICD-10-CM | POA: Insufficient documentation

## 2014-02-24 DIAGNOSIS — Z7982 Long term (current) use of aspirin: Secondary | ICD-10-CM | POA: Insufficient documentation

## 2014-02-24 DIAGNOSIS — F411 Generalized anxiety disorder: Secondary | ICD-10-CM | POA: Insufficient documentation

## 2014-02-24 DIAGNOSIS — Z9010 Acquired absence of unspecified breast and nipple: Secondary | ICD-10-CM | POA: Insufficient documentation

## 2014-02-24 DIAGNOSIS — M75100 Unspecified rotator cuff tear or rupture of unspecified shoulder, not specified as traumatic: Secondary | ICD-10-CM

## 2014-02-24 DIAGNOSIS — D509 Iron deficiency anemia, unspecified: Secondary | ICD-10-CM | POA: Insufficient documentation

## 2014-02-24 DIAGNOSIS — G589 Mononeuropathy, unspecified: Secondary | ICD-10-CM | POA: Insufficient documentation

## 2014-02-24 DIAGNOSIS — IMO0002 Reserved for concepts with insufficient information to code with codable children: Secondary | ICD-10-CM | POA: Insufficient documentation

## 2014-02-24 DIAGNOSIS — M67919 Unspecified disorder of synovium and tendon, unspecified shoulder: Principal | ICD-10-CM | POA: Insufficient documentation

## 2014-02-24 DIAGNOSIS — Z9221 Personal history of antineoplastic chemotherapy: Secondary | ICD-10-CM | POA: Insufficient documentation

## 2014-02-24 DIAGNOSIS — Z923 Personal history of irradiation: Secondary | ICD-10-CM | POA: Insufficient documentation

## 2014-02-24 DIAGNOSIS — M719 Bursopathy, unspecified: Principal | ICD-10-CM | POA: Insufficient documentation

## 2014-02-24 HISTORY — DX: Strain of muscle(s) and tendon(s) of the rotator cuff of unspecified shoulder, initial encounter: S46.019A

## 2014-02-24 HISTORY — PX: SHOULDER ARTHROSCOPY WITH ROTATOR CUFF REPAIR AND SUBACROMIAL DECOMPRESSION: SHX5686

## 2014-02-24 SURGERY — SHOULDER ARTHROSCOPY WITH ROTATOR CUFF REPAIR AND SUBACROMIAL DECOMPRESSION
Anesthesia: General | Site: Shoulder | Laterality: Left

## 2014-02-24 MED ORDER — OXYCODONE HCL 5 MG PO TABS
5.0000 mg | ORAL_TABLET | ORAL | Status: DC | PRN
  Administered 2014-02-25 – 2014-02-26 (×7): 10 mg via ORAL
  Filled 2014-02-24 (×7): qty 2

## 2014-02-24 MED ORDER — GLYCOPYRROLATE 0.2 MG/ML IJ SOLN
INTRAMUSCULAR | Status: AC
  Filled 2014-02-24: qty 2

## 2014-02-24 MED ORDER — METOCLOPRAMIDE HCL 5 MG/ML IJ SOLN: 5.0000 mg | Freq: Three times a day (TID) | INTRAMUSCULAR | Status: DC | PRN

## 2014-02-24 MED ORDER — MIDAZOLAM HCL 2 MG/2ML IJ SOLN
1.0000 mg | INTRAMUSCULAR | Status: DC | PRN
  Administered 2014-02-24: 1 mg via INTRAVENOUS

## 2014-02-24 MED ORDER — METHOCARBAMOL 100 MG/ML IJ SOLN: 500.0000 mg | Freq: Four times a day (QID) | INTRAVENOUS | Status: DC | PRN

## 2014-02-24 MED ORDER — CHLORHEXIDINE GLUCONATE 4 % EX LIQD
60.0000 mL | Freq: Once | CUTANEOUS | Status: DC
  Filled 2014-02-24: qty 60

## 2014-02-24 MED ORDER — MENTHOL 3 MG MT LOZG: 1.0000 | LOZENGE | OROMUCOSAL | Status: DC | PRN

## 2014-02-24 MED ORDER — ONDANSETRON HCL 4 MG/2ML IJ SOLN
INTRAMUSCULAR | Status: AC
  Filled 2014-02-24: qty 2

## 2014-02-24 MED ORDER — ALPRAZOLAM 0.5 MG PO TABS
2.0000 mg | ORAL_TABLET | Freq: Every evening | ORAL | Status: DC | PRN
  Administered 2014-02-25: 2 mg via ORAL
  Filled 2014-02-24: qty 4

## 2014-02-24 MED ORDER — LIDOCAINE HCL (CARDIAC) 20 MG/ML IV SOLN
INTRAVENOUS | Status: DC | PRN
  Administered 2014-02-24: 50 mg via INTRAVENOUS

## 2014-02-24 MED ORDER — METHOCARBAMOL 500 MG PO TABS
ORAL_TABLET | ORAL | Status: AC
  Filled 2014-02-24: qty 1

## 2014-02-24 MED ORDER — ONDANSETRON HCL 4 MG/2ML IJ SOLN: 4.0000 mg | Freq: Four times a day (QID) | INTRAMUSCULAR | Status: DC | PRN

## 2014-02-24 MED ORDER — ONDANSETRON HCL 4 MG PO TABS: 4.0000 mg | ORAL_TABLET | Freq: Four times a day (QID) | ORAL | Status: DC | PRN

## 2014-02-24 MED ORDER — PHENYLEPHRINE HCL 10 MG/ML IJ SOLN
10.0000 mg | INTRAMUSCULAR | Status: DC | PRN
  Administered 2014-02-24: 10 ug/min via INTRAVENOUS

## 2014-02-24 MED ORDER — PROMETHAZINE HCL 25 MG/ML IJ SOLN: 6.2500 mg | INTRAMUSCULAR | Status: DC | PRN

## 2014-02-24 MED ORDER — LACTATED RINGERS IV SOLN
INTRAVENOUS | Status: DC | PRN
  Administered 2014-02-24 (×2): via INTRAVENOUS

## 2014-02-24 MED ORDER — LIDOCAINE HCL (CARDIAC) 20 MG/ML IV SOLN
INTRAVENOUS | Status: AC
  Filled 2014-02-24: qty 5

## 2014-02-24 MED ORDER — MIDAZOLAM HCL 2 MG/2ML IJ SOLN
INTRAMUSCULAR | Status: AC
  Filled 2014-02-24: qty 2

## 2014-02-24 MED ORDER — EPINEPHRINE HCL 1 MG/ML IJ SOLN
INTRAMUSCULAR | Status: DC | PRN
  Administered 2014-02-24: .1 mL

## 2014-02-24 MED ORDER — LACTATED RINGERS IV SOLN
INTRAVENOUS | Status: DC
  Administered 2014-02-24: 12:00:00 via INTRAVENOUS

## 2014-02-24 MED ORDER — SODIUM CHLORIDE 0.9 % IR SOLN
Status: DC | PRN
  Administered 2014-02-24 (×2): 3000 mL

## 2014-02-24 MED ORDER — ACETAMINOPHEN 650 MG RE SUPP: 650.0000 mg | Freq: Four times a day (QID) | RECTAL | Status: DC | PRN

## 2014-02-24 MED ORDER — DULOXETINE HCL 60 MG PO CPEP
60.0000 mg | ORAL_CAPSULE | Freq: Two times a day (BID) | ORAL | Status: DC
  Administered 2014-02-25 – 2014-02-26 (×3): 60 mg via ORAL
  Filled 2014-02-24 (×5): qty 1

## 2014-02-24 MED ORDER — FENTANYL CITRATE 0.05 MG/ML IJ SOLN
INTRAMUSCULAR | Status: AC
  Filled 2014-02-24: qty 5

## 2014-02-24 MED ORDER — ASPIRIN EC 81 MG PO TBEC
81.0000 mg | DELAYED_RELEASE_TABLET | Freq: Every day | ORAL | Status: DC
  Administered 2014-02-24: 81 mg via ORAL
  Filled 2014-02-24 (×2): qty 1

## 2014-02-24 MED ORDER — ROCURONIUM BROMIDE 50 MG/5ML IV SOLN
INTRAVENOUS | Status: AC
  Filled 2014-02-24: qty 1

## 2014-02-24 MED ORDER — AMPHETAMINE-DEXTROAMPHETAMINE 10 MG PO TABS
20.0000 mg | ORAL_TABLET | Freq: Every day | ORAL | Status: DC
  Administered 2014-02-25 – 2014-02-26 (×2): 20 mg via ORAL
  Filled 2014-02-24 (×2): qty 2

## 2014-02-24 MED ORDER — LIDOCAINE HCL 4 % MT SOLN
OROMUCOSAL | Status: DC | PRN
  Administered 2014-02-24: 4 mL via TOPICAL

## 2014-02-24 MED ORDER — GLYCOPYRROLATE 0.2 MG/ML IJ SOLN
INTRAMUSCULAR | Status: DC | PRN
  Administered 2014-02-24: 0.4 mg via INTRAVENOUS

## 2014-02-24 MED ORDER — METOCLOPRAMIDE HCL 10 MG PO TABS: 5.0000 mg | ORAL_TABLET | Freq: Three times a day (TID) | ORAL | Status: DC | PRN

## 2014-02-24 MED ORDER — HYDROCHLOROTHIAZIDE 12.5 MG PO CAPS
12.5000 mg | ORAL_CAPSULE | Freq: Every day | ORAL | Status: DC
  Administered 2014-02-25: 12.5 mg via ORAL
  Filled 2014-02-24 (×3): qty 1

## 2014-02-24 MED ORDER — ONDANSETRON HCL 4 MG/2ML IJ SOLN
INTRAMUSCULAR | Status: DC | PRN
  Administered 2014-02-24: 4 mg via INTRAVENOUS

## 2014-02-24 MED ORDER — PROPOFOL 10 MG/ML IV BOLUS
INTRAVENOUS | Status: AC
  Filled 2014-02-24: qty 20

## 2014-02-24 MED ORDER — SODIUM CHLORIDE 0.9 % IJ SOLN
INTRAMUSCULAR | Status: DC | PRN
  Administered 2014-02-24: 30 mL via INTRAVENOUS
  Administered 2014-02-24: 20 mL via INTRAVENOUS

## 2014-02-24 MED ORDER — CEFAZOLIN SODIUM-DEXTROSE 2-3 GM-% IV SOLR
2.0000 g | Freq: Three times a day (TID) | INTRAVENOUS | Status: AC
  Administered 2014-02-24 – 2014-02-25 (×2): 2 g via INTRAVENOUS
  Filled 2014-02-24 (×2): qty 50

## 2014-02-24 MED ORDER — NEOSTIGMINE METHYLSULFATE 1 MG/ML IJ SOLN
INTRAMUSCULAR | Status: DC | PRN
  Administered 2014-02-24: 3 mg via INTRAVENOUS

## 2014-02-24 MED ORDER — TAMOXIFEN CITRATE 20 MG PO TABS: 20.0000 mg | ORAL_TABLET | Freq: Every day | ORAL | Status: DC

## 2014-02-24 MED ORDER — HYDROMORPHONE HCL PF 1 MG/ML IJ SOLN: 0.2500 mg | INTRAMUSCULAR | Status: DC | PRN

## 2014-02-24 MED ORDER — OXYCODONE HCL 5 MG PO TABS
ORAL_TABLET | ORAL | Status: AC
  Filled 2014-02-24: qty 1

## 2014-02-24 MED ORDER — EPINEPHRINE HCL 1 MG/ML IJ SOLN
INTRAMUSCULAR | Status: AC
  Filled 2014-02-24: qty 1

## 2014-02-24 MED ORDER — FENTANYL CITRATE 0.05 MG/ML IJ SOLN
INTRAMUSCULAR | Status: AC
  Filled 2014-02-24: qty 2

## 2014-02-24 MED ORDER — PHENOL 1.4 % MT LIQD: 1.0000 | OROMUCOSAL | Status: DC | PRN

## 2014-02-24 MED ORDER — IRBESARTAN 150 MG PO TABS
150.0000 mg | ORAL_TABLET | Freq: Every day | ORAL | Status: DC
  Administered 2014-02-25: 150 mg via ORAL
  Filled 2014-02-24 (×3): qty 1

## 2014-02-24 MED ORDER — OXYCODONE HCL 5 MG PO TABS
5.0000 mg | ORAL_TABLET | Freq: Once | ORAL | Status: AC | PRN
  Administered 2014-02-24: 5 mg via ORAL

## 2014-02-24 MED ORDER — PROPOFOL 10 MG/ML IV BOLUS
INTRAVENOUS | Status: DC | PRN
  Administered 2014-02-24: 140 mg via INTRAVENOUS

## 2014-02-24 MED ORDER — ACETAMINOPHEN 325 MG PO TABS: 650.0000 mg | ORAL_TABLET | Freq: Four times a day (QID) | ORAL | Status: DC | PRN

## 2014-02-24 MED ORDER — MORPHINE SULFATE 2 MG/ML IJ SOLN
1.0000 mg | INTRAMUSCULAR | Status: DC | PRN
  Administered 2014-02-25: 1 mg via INTRAVENOUS
  Filled 2014-02-24 (×2): qty 1

## 2014-02-24 MED ORDER — FENTANYL CITRATE 0.05 MG/ML IJ SOLN
INTRAMUSCULAR | Status: DC | PRN
  Administered 2014-02-24: 50 ug via INTRAVENOUS

## 2014-02-24 MED ORDER — FENTANYL CITRATE 0.05 MG/ML IJ SOLN
50.0000 ug | INTRAMUSCULAR | Status: DC | PRN
Start: 2014-02-24 — End: 2014-02-24
  Administered 2014-02-24: 50 ug via INTRAVENOUS

## 2014-02-24 MED ORDER — METHOCARBAMOL 500 MG PO TABS
500.0000 mg | ORAL_TABLET | Freq: Four times a day (QID) | ORAL | Status: DC | PRN
  Administered 2014-02-24 – 2014-02-26 (×5): 500 mg via ORAL
  Filled 2014-02-24 (×4): qty 1

## 2014-02-24 MED ORDER — ROCURONIUM BROMIDE 100 MG/10ML IV SOLN
INTRAVENOUS | Status: DC | PRN
  Administered 2014-02-24: 35 mg via INTRAVENOUS

## 2014-02-24 MED ORDER — TAMOXIFEN CITRATE 10 MG PO TABS
20.0000 mg | ORAL_TABLET | Freq: Every day | ORAL | Status: DC
  Administered 2014-02-25 – 2014-02-26 (×2): 20 mg via ORAL
  Filled 2014-02-24 (×3): qty 2

## 2014-02-24 MED ORDER — VALSARTAN-HYDROCHLOROTHIAZIDE 320-25 MG PO TABS: 0.5000 | ORAL_TABLET | Freq: Every day | ORAL | Status: DC

## 2014-02-24 MED ORDER — RIVAROXABAN 10 MG PO TABS
10.0000 mg | ORAL_TABLET | Freq: Every day | ORAL | Status: DC
  Filled 2014-02-24 (×2): qty 1

## 2014-02-24 MED ORDER — OXYCODONE HCL 5 MG/5ML PO SOLN: 5.0000 mg | Freq: Once | ORAL | Status: AC | PRN

## 2014-02-24 SURGICAL SUPPLY — 81 items
ANCH SUT 2 FT CRKSCW 14.7 STRL (Anchor) ×2 IMPLANT
ANCH SUT PUSHLCK 24X4.5 STRL (Orthopedic Implant) ×2 IMPLANT
ANCHOR CORKSCREW BIO 5.5 FT (Anchor) ×2 IMPLANT
APL SKNCLS STERI-STRIP NONHPOA (GAUZE/BANDAGES/DRESSINGS) ×1
BENZOIN TINCTURE PRP APPL 2/3 (GAUZE/BANDAGES/DRESSINGS) ×2 IMPLANT
BIT DRILL TAK (DRILL) IMPLANT
BLADE CUDA 5.5 (BLADE) IMPLANT
BLADE CUTTER GATOR 3.5 (BLADE) ×2 IMPLANT
BLADE GREAT WHITE 4.2 (BLADE) ×2 IMPLANT
BLADE SURG 11 STRL SS (BLADE) ×2 IMPLANT
BLADE SURG 15 STRL LF DISP TIS (BLADE) IMPLANT
BLADE SURG 15 STRL SS (BLADE) ×2
BUR GATOR 2.9 (BURR) IMPLANT
BUR OVAL 6.0 (BURR) ×2 IMPLANT
CANNULA SHOULDER 7CM (CANNULA) IMPLANT
CLSR STERI-STRIP ANTIMIC 1/2X4 (GAUZE/BANDAGES/DRESSINGS) ×1 IMPLANT
COVER SURGICAL LIGHT HANDLE (MISCELLANEOUS) ×2 IMPLANT
DRAPE INCISE IOBAN 66X45 STRL (DRAPES) ×4 IMPLANT
DRAPE STERI 35X30 U-POUCH (DRAPES) ×2 IMPLANT
DRAPE U-SHAPE 47X51 STRL (DRAPES) ×4 IMPLANT
DRILL TAK (DRILL)
DRSG PAD ABDOMINAL 8X10 ST (GAUZE/BANDAGES/DRESSINGS) ×6 IMPLANT
DURAPREP 26ML APPLICATOR (WOUND CARE) ×3 IMPLANT
ELECT REM PT RETURN 9FT ADLT (ELECTROSURGICAL)
ELECTRODE REM PT RTRN 9FT ADLT (ELECTROSURGICAL) ×1 IMPLANT
FILTER STRAW FLUID ASPIR (MISCELLANEOUS) ×2 IMPLANT
GAUZE XEROFORM 1X8 LF (GAUZE/BANDAGES/DRESSINGS) ×2 IMPLANT
GLOVE BIOGEL PI IND STRL 8 (GLOVE) ×1 IMPLANT
GLOVE BIOGEL PI INDICATOR 8 (GLOVE) ×1
GLOVE SURG ORTHO 8.0 STRL STRW (GLOVE) ×2 IMPLANT
GOWN STRL REUS W/ TWL LRG LVL3 (GOWN DISPOSABLE) ×3 IMPLANT
GOWN STRL REUS W/TWL LRG LVL3 (GOWN DISPOSABLE) ×6
KIT BASIN OR (CUSTOM PROCEDURE TRAY) ×2 IMPLANT
KIT BEACH CHAIR TRIMANO (Kit) ×1 IMPLANT
KIT BIO-TENODESIS 3X8 DISP (MISCELLANEOUS) ×2
KIT INSRT BABSR STRL DISP BTN (MISCELLANEOUS) IMPLANT
KIT ROOM TURNOVER OR (KITS) ×2 IMPLANT
MANIFOLD NEPTUNE II (INSTRUMENTS) ×2 IMPLANT
NDL HYPO 25X1 1.5 SAFETY (NEEDLE) ×1 IMPLANT
NDL SPNL 18GX3.5 QUINCKE PK (NEEDLE) ×1 IMPLANT
NDL SUT 6 .5 CRC .975X.05 MAYO (NEEDLE) ×1 IMPLANT
NEEDLE HYPO 25X1 1.5 SAFETY (NEEDLE) ×2 IMPLANT
NEEDLE MAYO TAPER (NEEDLE)
NEEDLE SPNL 18GX3.5 QUINCKE PK (NEEDLE) ×2 IMPLANT
NS IRRIG 1000ML POUR BTL (IV SOLUTION) ×2 IMPLANT
PACK SHOULDER (CUSTOM PROCEDURE TRAY) ×2 IMPLANT
PAD ABD 8X10 STRL (GAUZE/BANDAGES/DRESSINGS) ×1 IMPLANT
PAD ARMBOARD 7.5X6 YLW CONV (MISCELLANEOUS) ×4 IMPLANT
PUSHLOCK PEEK 4.5X24 (Orthopedic Implant) ×2 IMPLANT
SCREW BIO TENODEIS 7MM (Screw) ×1 IMPLANT
SET ARTHROSCOPY TUBING (MISCELLANEOUS) ×4
SET ARTHROSCOPY TUBING LN (MISCELLANEOUS) ×1 IMPLANT
SLING ARM FOAM STRAP LRG (SOFTGOODS) ×1 IMPLANT
SLING ARM IMMOBILIZER MED (SOFTGOODS) IMPLANT
SPEAR FASTAKII (SLEEVE) IMPLANT
SPONGE GAUZE 4X4 12PLY (GAUZE/BANDAGES/DRESSINGS) ×2 IMPLANT
SPONGE GAUZE 4X4 12PLY STER LF (GAUZE/BANDAGES/DRESSINGS) ×1 IMPLANT
SPONGE LAP 4X18 X RAY DECT (DISPOSABLE) ×4 IMPLANT
STRIP CLOSURE SKIN 1/2X4 (GAUZE/BANDAGES/DRESSINGS) ×2 IMPLANT
SUCTION FRAZIER TIP 10 FR DISP (SUCTIONS) ×2 IMPLANT
SUT ETHILON 3 0 PS 1 (SUTURE) ×2 IMPLANT
SUT FIBERWIRE 2-0 18 17.9 3/8 (SUTURE)
SUT PROLENE 3 0 PS 2 (SUTURE) ×2 IMPLANT
SUT VIC AB 0 CT1 27 (SUTURE) ×4
SUT VIC AB 0 CT1 27XBRD ANBCTR (SUTURE) ×2 IMPLANT
SUT VIC AB 1 CT1 27 (SUTURE) ×2
SUT VIC AB 1 CT1 27XBRD ANBCTR (SUTURE) ×1 IMPLANT
SUT VIC AB 2-0 CT1 27 (SUTURE) ×4
SUT VIC AB 2-0 CT1 TAPERPNT 27 (SUTURE) ×1 IMPLANT
SUT VIC AB 2-0 UR5 27 (SUTURE) ×1 IMPLANT
SUT VICRYL 0 UR6 27IN ABS (SUTURE) ×2 IMPLANT
SUTURE FIBERWR 2-0 18 17.9 3/8 (SUTURE) IMPLANT
SYR 20CC LL (SYRINGE) ×4 IMPLANT
SYR 3ML LL SCALE MARK (SYRINGE) ×2 IMPLANT
SYR TB 1ML LUER SLIP (SYRINGE) ×2 IMPLANT
TAPE CLOTH SURG 4X10 WHT LF (GAUZE/BANDAGES/DRESSINGS) ×1 IMPLANT
TOWEL OR 17X24 6PK STRL BLUE (TOWEL DISPOSABLE) ×2 IMPLANT
TOWEL OR 17X26 10 PK STRL BLUE (TOWEL DISPOSABLE) ×2 IMPLANT
TRIMANO BEACH CHAIR KIT ×1 IMPLANT
WAND 90 DEG TURBOVAC W/CORD (SURGICAL WAND) ×1 IMPLANT
WATER STERILE IRR 1000ML POUR (IV SOLUTION) ×2 IMPLANT

## 2014-02-24 NOTE — Progress Notes (Signed)
Reported to Dr. Orene Desanctis, pt. Showing extra heartbeats- PAC's. No new orders. Will continue to watch closely, pt. Reports that she feels fine, relaxed.

## 2014-02-24 NOTE — Brief Op Note (Signed)
02/24/2014  6:51 PM  PATIENT:  Kristi [name]  58 y.o. female  PRE-OPERATIVE DIAGNOSIS:  LEFT SHOULDER BURSITIS, ROTATOR CUFF TEAR  POST-OPERATIVE DIAGNOSIS:  LEFT SHOULDER BURSITIS, ROTATOR CUFF TEAR  PROCEDURE:  Procedure(s): SHOULDER ARTHROSCOPY WITH ROTATOR CUFF REPAIR AND SUBACROMIAL DECOMPRESSION biceps tenodesis  SURGEON:  Surgeon(s): Meredith Pel, MD  ASSISTANT: carla bethune rnfa  ANESTHESIA:   general  EBL: 25 ml    Total I/O In: 1000 [I.V.:1000] Out: -   BLOOD ADMINISTERED: none  DRAINS: none   LOCAL MEDICATIONS USED:  none  SPECIMEN:  No Specimen  COUNTS:  YES  TOURNIQUET:  * No tourniquets in log *  DICTATION: .Other Dictation: Dictation Number [id number]   PLAN OF CARE: Admit for overnight observation  PATIENT DISPOSITION:  PACU - hemodynamically stable

## 2014-02-24 NOTE — Anesthesia Preprocedure Evaluation (Addendum)
Anesthesia Evaluation  Patient identified by MRN, date of birth, ID band Patient awake    Reviewed: Allergy & Precautions, H&P , Patient's Chart, lab work & pertinent test results  History of Anesthesia Complications Negative for: history of anesthetic complications  Airway Mallampati: I      Dental  (+) Teeth Intact, Dental Advisory Given   Pulmonary neg pulmonary ROS,  breath sounds clear to auscultation        Cardiovascular hypertension, Rhythm:Regular Rate:Normal     Neuro/Psych Anxiety neuropathy TIA Neuromuscular disease    GI/Hepatic negative GI ROS, Neg liver ROS,   Endo/Other  negative endocrine ROS  Renal/GU negative Renal ROS     Musculoskeletal  (+) Arthritis -,   Abdominal   Peds  Hematology  (+) anemia ,   Anesthesia Other Findings   Reproductive/Obstetrics                          Anesthesia Physical Anesthesia Plan  ASA: III  Anesthesia Plan: General   Post-op Pain Management:    Induction: Intravenous  Airway Management Planned: Oral ETT  Additional Equipment:   Intra-op Plan:   Post-operative Plan: Extubation in OR  Informed Consent: I have reviewed the patients History and Physical, chart, labs and discussed the procedure including the risks, benefits and alternatives for the proposed anesthesia with the patient or authorized representative who has indicated his/her understanding and acceptance.   Dental advisory given  Plan Discussed with:   Anesthesia Plan Comments: (Possible transfusion)        Anesthesia Quick Evaluation

## 2014-02-24 NOTE — Transfer of Care (Signed)
Immediate Anesthesia Transfer of Care Note  Patient: Kristi [name]  Procedure(s) Performed: Procedure(s) with comments: SHOULDER ARTHROSCOPY WITH ROTATOR CUFF REPAIR AND SUBACROMIAL DECOMPRESSION (Left) - LEFT SHOULDER DIAGNOSTIC OPERATIVE ARTHROSCOPY, SUBACROMIAL DECOMPRESSION, ROTATOR CUFF TEAR REPAIR  Patient Location: PACU  Anesthesia Type:GA combined with regional for post-op pain  Level of Consciousness: awake, oriented and sedated  Airway & Oxygen Therapy: Patient Spontanous Breathing and Patient connected to nasal cannula oxygen  Post-op Assessment: Report given to PACU RN and Post -op Vital signs reviewed and stable  Post vital signs: Reviewed and stable  Complications: No apparent anesthesia complications

## 2014-02-24 NOTE — Anesthesia Postprocedure Evaluation (Signed)
  Anesthesia Post-op Note  Patient: Kristi [name]  Procedure(s) Performed: Procedure(s) with comments: SHOULDER ARTHROSCOPY WITH ROTATOR CUFF REPAIR AND SUBACROMIAL DECOMPRESSION (Left) - LEFT SHOULDER DIAGNOSTIC OPERATIVE ARTHROSCOPY, SUBACROMIAL DECOMPRESSION, ROTATOR CUFF TEAR REPAIR  Patient Location: PACU  Anesthesia Type:General and block  Level of Consciousness: awake and alert   Airway and Oxygen Therapy: Patient Spontanous Breathing  Post-op Pain: none  Post-op Assessment: Post-op Vital signs reviewed, Patient's Cardiovascular Status Stable and Respiratory Function Stable  Post-op Vital Signs: Reviewed  Filed Vitals:   02/24/14 2030  BP: 117/66  Pulse: 69  Temp: 36.4 C  Resp: 15    Complications: No apparent anesthesia complications

## 2014-02-24 NOTE — Anesthesia Procedure Notes (Signed)
Procedure Name: Intubation Date/Time: 02/24/2014 4:39 PM Performed by: Izora Gala Pre-anesthesia Checklist: Patient identified, Emergency Drugs available, Suction available and Patient being monitored Patient Re-evaluated:Patient Re-evaluated prior to inductionOxygen Delivery Method: Circle system utilized Preoxygenation: Pre-oxygenation with 100% oxygen Intubation Type: IV induction Ventilation: Mask ventilation without difficulty Laryngoscope Size: Miller and 3 Grade View: Grade I Tube type: Oral Tube size: 7.5 mm Number of attempts: 1 Airway Equipment and Method: Stylet Placement Confirmation: ETT inserted through vocal cords under direct vision,  positive ETCO2 and breath sounds checked- equal and bilateral Secured at: 22 cm Tube secured with: Tape Dental Injury: Teeth and Oropharynx as per pre-operative assessment

## 2014-02-24 NOTE — H&P (Signed)
Kristi [name] is an 58 y.o. female.   Chief Complaint: Left shoulder pain HPI: Kristi [name] is a 58 year old patient with left shoulder pain. Denies any discrete history of injury but reports several months of symptoms. She recently finished chemotherapy and radiation therapy for breast cancer on the left side. MRI scanning shows rotator cuff tear. She reports significant pain with activities of daily living. She's failed conservative management including activity modification and an an injection.  Past Medical History  Diagnosis Date  . Anxiety   . Hypertension   . MRSA (methicillin resistant Staphylococcus aureus) 2009  . Depression   . Anemia     Iron deficinecy anemia  . History of radiation therapy 09/09/13-10/28/13    45 gray to left breast, lumpectomy cavity boosted to 63 gray  . Neuropathy   . Arthritis   . Breast cancer     left    Past Surgical History  Procedure Laterality Date  . Anal sphincterotomy  04/2011  . Hemorrhoid surgery  04/2011    ligation  . Breast lumpectomy with needle localization Left 02/04/2013    Procedure: LEFT BREAST LUMPECTOMY WITH NEEDLE LOCALIZATION;  Surgeon: Stark Klein, MD;  Location: [facility];  Service: General;  Laterality: Left;  . Axillary lymph node dissection Left 02/04/2013    Procedure: LEFT AXILLARY LYMPH NODE DISSECTION;  Surgeon: Stark Klein, MD;  Location: [facility];  Service: General;  Laterality: Left;  End: 1610  . Portacath placement Right 02/04/2013    Procedure: INSERTION PORT-A-CATH;  Surgeon: Stark Klein, MD;  Location: [facility];  Service: General;  Laterality: Right;  Start Time: 9604.  Marland Kitchen Appendectomy  1980  . Breast surgery      Lumpectomy in april 2014    Family History  Problem Relation Age of Onset  . Lung cancer Father   . Hypertension Father   . Thyroid cancer Father     dx in his 56s  . Breast cancer Paternal Aunt 86  . Colon cancer Paternal Aunt     dx in her 50x  . Cervical cancer Paternal Aunt     dzx in her 66s  . Ovarian  cancer Cousin     dx in her lage 51s  . Breast cancer Cousin     maternal first cousin, once removed; dx in her late 37s  . Breast cancer Cousin     maternal first cousin once removed; dx in late 75s  . Hypertension Mother   . Diabetes Mother   . Dementia Mother   . Hypertension Brother   . Seizures Brother     Alcohol induced.  . Cancer Paternal Uncle     oral cancer  . Kidney cancer Paternal Grandmother   . Cancer Cousin     several paternal cousins with brain cancer, leukemia, and other cancers   Social History:  reports that she has never smoked. She has never used smokeless tobacco. She reports that she does not drink alcohol or use illicit drugs.  Allergies: No Known Allergies  Medications Prior to Admission  Medication Sig Dispense Refill  . ALPRAZolam (XANAX) 1 MG tablet Take 2 mg by mouth at bedtime as needed for sleep.       Marland Kitchen amphetamine-dextroamphetamine (ADDERALL) 20 MG tablet Take 20 mg by mouth daily.      Marland Kitchen aspirin EC 81 MG EC tablet Take 1 tablet (81 mg total) by mouth daily.  30 tablet  0  . B Complex-C (SUPER B COMPLEX PO) Take 1  tablet by mouth daily.      . DULoxetine (CYMBALTA) 60 MG capsule Take 60 mg by mouth 2 (two) times daily.      Marland Kitchen gabapentin (NEURONTIN) 800 MG tablet Take 1 tablet (800 mg total) by mouth 3 (three) times daily.  90 tablet  6  . HYDROcodone-acetaminophen (NORCO/VICODIN) 5-325 MG per tablet Take 1 tablet by mouth every 6 (six) hours as needed for moderate pain.      Marland Kitchen ibuprofen (ADVIL,MOTRIN) 600 MG tablet Take 600 mg by mouth every 6 (six) hours as needed for moderate pain.      . Multiple Vitamin (MULTIVITAMIN) tablet Take 1 tablet by mouth daily.      . tamoxifen (NOLVADEX) 20 MG tablet Take 20 mg by mouth daily.      . valsartan-hydrochlorothiazide (DIOVAN-HCT) 320-25 MG per tablet Take 0.5 tablets by mouth daily.       Marland Kitchen L-Methylfolate-Algae-B12-B6 (METANX) 3-90.314-2-35 MG CAPS         No results found for this or any previous  visit (from the past 85 hour(s)). No results found.  Review of Systems  Constitutional: Negative.   HENT: Negative.   Eyes: Negative.   Respiratory: Negative.   Cardiovascular: Negative.   Gastrointestinal: Negative.   Genitourinary: Negative.   Musculoskeletal: Positive for joint pain.  Skin: Negative.   Neurological: Negative.   Endo/Heme/Allergies: Negative.   Psychiatric/Behavioral: Negative.     Blood pressure 113/69, pulse 71, temperature 97.2 F (36.2 C), temperature source Oral, resp. rate 18, height 5\' 2"  (1.575 m), weight 65.318 kg (144 lb), last menstrual period 01/20/2013, SpO2 100.00%. Physical Exam  Constitutional: She appears well-developed.  HENT:  Head: Normocephalic.  Eyes: Pupils are equal, round, and reactive to light.  Neck: Normal range of motion.  Cardiovascular: Normal rate.   Respiratory: Effort normal.  Musculoskeletal: Normal range of motion.  Neurological: She is alert.  Skin: Skin is warm.  Psychiatric: She has a normal mood and affect.   examination the left shoulder demonstrates coarse grinding intralateral with active and passive motion some suprapubic his weakness otherwise negative the patient positive no a.c. joint or stretch palpation neck range of motion full no masses lymphadenopathy or skin digit left shoulder region a subtle bit more restricted extra rotation left compared to the right restricted for flexion by the 2030 left versus right  Assessment/Plan Impression is left shoulder rotator cuff tear potential early frozen shoulder plan is for arthroscopy possible rotator interval release [place] decompression evaluation biceps tendon the open rotator cuff repair risk and benefits discussed with patient we will and to infection nerve vessel damage shoulder stiffness incomplete pain relief she will need to have a CPM machine for about 6 hours after surgery to work on achieving full range of motion all questions answered  Meredith Pel 02/24/2014, 12:50 PM

## 2014-02-25 MED ORDER — GABAPENTIN 400 MG PO CAPS
800.0000 mg | ORAL_CAPSULE | Freq: Three times a day (TID) | ORAL | Status: DC
  Administered 2014-02-25 – 2014-02-26 (×4): 800 mg via ORAL
  Filled 2014-02-25 (×6): qty 2

## 2014-02-25 MED ORDER — ASPIRIN 325 MG PO TABS
325.0000 mg | ORAL_TABLET | Freq: Every day | ORAL | Status: DC
  Administered 2014-02-25 – 2014-02-26 (×2): 325 mg via ORAL
  Filled 2014-02-25 (×2): qty 1

## 2014-02-25 MED ORDER — OXYCODONE HCL ER 15 MG PO T12A
15.0000 mg | EXTENDED_RELEASE_TABLET | Freq: Two times a day (BID) | ORAL | Status: DC
  Administered 2014-02-25 – 2014-02-26 (×3): 15 mg via ORAL
  Filled 2014-02-25 (×3): qty 1

## 2014-02-25 MED ORDER — HYDROMORPHONE HCL PF 1 MG/ML IJ SOLN
0.5000 mg | INTRAMUSCULAR | Status: DC | PRN
  Administered 2014-02-25 (×2): 0.5 mg via INTRAVENOUS
  Filled 2014-02-25 (×2): qty 1

## 2014-02-25 MED ORDER — ALPRAZOLAM 0.5 MG PO TABS
2.0000 mg | ORAL_TABLET | Freq: Once | ORAL | Status: AC
  Administered 2014-02-25: 2 mg via ORAL
  Filled 2014-02-25: qty 4

## 2014-02-25 NOTE — Op Note (Signed)
[name], [name] NO.:  [id number]  MEDICAL RECORD NO.:  [id number]  LOCATION:  5N22C                        FACILITY:  [facility]  PHYSICIAN:  Anderson Malta, M.D.    DATE OF BIRTH:  12-24-1955  DATE OF PROCEDURE:  02/24/2014 DATE OF DISCHARGE:                              OPERATIVE REPORT   PREOPERATIVE DIAGNOSIS:  Left shoulder rotator cuff tear.  POSTOPERATIVE DIAGNOSES:  Left shoulder rotator cuff tear and biceps tendonitis.  PROCEDURE:  Left shoulder diagnostic arthroscopy, debridement, rotator interval release, open biceps tenodesis, open rotator cuff repair.  SURGEON:  Anderson Malta, M.D.  ASSISTANT:  Kathee Delton, RNFA.  INDICATIONS:  Kristi [name] is a patient with left shoulder pain who presents for operative management after explanation of risks and benefits.  MRI scan showed rotator cuff tear.  Intraoperative findings included biceps tendonitis with significant fraying as well as early frozen shoulder.  OPERATIVE FINDINGS: 1. Examination under anesthesia, range of motion, forward flexion is     about 145, external rotation 15 degrees, abduction is 75.  Shoulder     stability is excellent. 2. Diagnostic arthroscopy:. 3. Tearing and fraying of the biceps tendon extending all the way to     the bicipital groove.  Significant tendinitis and shedding of the     fibers was present. 4. Rotator cuff tear measuring about 2 x 2 cm. 5. Mild degenerative changes on the humeral head. 6. Bursitis.  PROCEDURE IN DETAIL:  The patient was brought to the operating room where general endotracheal anesthesia was induced.  Preoperative antibiotics were administered.  Time-out was called.  The patient was placed in a beach-chair position with the head in neutral position. Left shoulder was examined under anesthesia, found to have early frozen shoulder manipulation performed in order to achieve full forward flexion, abduction greater than 90, and external rotation to  65.  At this time, the arm and shoulder were prescrubbed with alcohol and Betadine, which were allowed to air dry, prepped with DuraPrep solution, draped in sterile manner.  Charlie Pitter was used to cover the axilla. Posterior portal created 2 cm medial and inferior to pressure on margin of the acromion, diagnostic arthroscopy was performed.  The patient had significant synovitis in the rotator interval.  Biceps tendon significantly frayed.  Biceps tendon released, labrum extensively debrided, rotator interval released, rotator cuff identified, 2 x 2 cm tear, some retraction at this time; all this was performed after creation of an anterior portal under direct visualization.  At this time, following debridement and biceps tendon release, instruments were removed from the portals.  Portals were closed using 3-0 nylon. Anterior and lateral incision was made.  Deltoid split, measured distance 4 cm, marked with #1 Vicryl suture.  Self-retaining retractors were placed, rotator cuff tear identified, rotator cuff tear was mobilized, footprint prepared, biceps tendon stump identified, transverse humeral ligament opened on the medial side, biceps tendon tenodesed using Arthrex biceps tendon tenodesis screw, had appropriate tension 7 x 23 mm, good fixation achieved.  At this time, bone quality was marginal.  At this time, rotator cuff tear was fixed using two 5.5 corkscrew suture anchors and two 4.5 Push-Locks.  Again  bone quality was marginal, but fixation was good.  Good repair of the rotator cuff was identified.  Subacromial decompression, bursectomy was also performed with removal of the spur.  At this time, thorough irrigation performed, deltoid split, closed using #1 Vicryl suture followed by interrupted inverted 2-0 Vicryl suture and 3-0 Prolene.  The patient tolerated the procedure well without immediate complications.  Transferred to the recovery room in a stable condition.  Bulky sling and  dressing applied.     Anderson Malta, M.D.     GSD/MEDQ  D:  02/24/2014  T:  02/25/2014  Job:  [id number]

## 2014-02-25 NOTE — Progress Notes (Signed)
Subjective: Pt with pain not well controlled - doesn't want to be on xarelto   Objective: Vital signs in last 24 hours: Temp:  [97.2 F (36.2 C)-97.9 F (36.6 C)] 97.6 F (36.4 C) (04/22 0453) Pulse Rate:  [59-85] 68 (04/22 0453) Resp:  [12-21] 18 (04/22 0453) BP: (91-142)/(52-71) 96/53 mmHg (04/22 0615) SpO2:  [97 %-100 %] 99 % (04/22 0453) Weight:  [65.318 kg (144 lb)] 65.318 kg (144 lb) (04/21 1129)  Intake/Output from previous day: 04/21 0701 - 04/22 0700 In: 1600 [I.V.:1500; IV Piggyback:100] Out: -  Intake/Output this shift:    Exam:  No cellulitis present Compartment soft  Labs: No results found for this basename: HGB,  in the last 72 hours No results found for this basename: WBC, RBC, HCT, PLT,  in the last 72 hours No results found for this basename: NA, K, CL, CO2, BUN, CREATININE, GLUCOSE, CALCIUM,  in the last 72 hours No results found for this basename: LABPT, INR,  in the last 72 hours  Assessment/Plan: Plan ot for rom - add oxy er - add gabapentin - possible dc am   Meredith Pel 02/25/2014, 7:17 AM

## 2014-02-25 NOTE — Evaluation (Signed)
Occupational Therapy Evaluation Patient Details Name: Kristi [name] MRN: [id number] DOB: 27-Nov-1955 Today's Date: 03/05/2014    History of Present Illness Left shoulder rotator cuff tear   Clinical Impression   Pt presents to OT with decreased I with ADL activity s/p shoulder surgery.  Pt to benefit from skilled OT to increase I with ADL activity s/p shoulder surgery per Dr Marlou Sa    Follow Up Recommendations  Home health OT    Equipment Recommendations  None recommended by OT           Mobility Bed Mobility Overal bed mobility: Needs Assistance Bed Mobility: Supine to Sit;Sit to Supine     Supine to sit: Mod assist Sit to supine: Mod assist      Transfers Overall transfer level: Needs assistance Equipment used: 1 person hand held assist Transfers: Sit to/from Stand Sit to Stand: Min assist                             Hand Dominance   left   Extremity/Trunk Assessment Upper Extremity Assessment Upper Extremity Assessment: LUE deficits/detail LUE Deficits / Details: s/p shoulder surgery           Communication Communication Communication: No difficulties   Cognition Arousal/Alertness: Awake/alert Behavior During Therapy: Flat affect Overall Cognitive Status: Within Functional Limits for tasks assessed                           Shoulder Instructions Shoulder Instructions Donning/doffing shirt without moving shoulder: Moderate assistance Method for sponge bathing under operated UE: Moderate assistance Donning/doffing sling/immobilizer: Moderate assistance Correct positioning of sling/immobilizer: Moderate assistance Pendulum exercises (written home exercise program): Moderate assistance ROM for elbow, wrist and digits of operated UE: Moderate assistance Sling wearing schedule (on at all times/off for ADL's): Moderate assistance Proper positioning of operated UE when showering: Moderate assistance Positioning of UE while sleeping:  Moderate assistance    Home Living Family/patient expects to be discharged to:: Private residence Living Arrangements: Spouse/significant other;Children Available Help at Discharge: Family Type of Home: House Home Access: Stairs to enter     Home Layout: Two level;Bed/bath upstairs   Alternate Level Stairs-Rails: Right Bathroom Shower/Tub: Walk-in shower;Door                    Prior Functioning/Environment Level of Independence: Independent             OT Diagnosis: Generalized weakness;Acute pain   OT Problem List: Decreased strength;Decreased activity tolerance;Pain   OT Treatment/Interventions: Self-care/ADL training;DME and/or AE instruction;Patient/family education;Therapeutic exercise    OT Goals(Current goals can be found in the care plan section) Acute Rehab OT Goals Patient Stated Goal: gettting back to using my left arm OT Goal Formulation: With patient Time For Goal Achievement: 03/11/14 Potential to Achieve Goals: Good  OT Frequency: Min 3X/week    End of Session Nurse Communication: Mobility status  Activity Tolerance: Patient limited by pain Patient left: in bed;with call bell/phone within reach   Time: 0945-1020 OT Time Calculation (min): 35 min Charges:  OT General Charges $OT Visit: 1 Procedure OT Evaluation $Initial OT Evaluation Tier I: 1 Procedure OT Treatments $Therapeutic Activity: 23-37 mins G-Codes:    Betsy Pries 2014/03/05, 10:27 AM

## 2014-02-25 NOTE — Progress Notes (Signed)
UR completed 

## 2014-02-25 NOTE — Progress Notes (Signed)
Orthopedic Tech Progress Note Patient Details:  Kristi [name] 1956/07/04 [id number]  Ortho Devices Type of Ortho Device: Arm sling Ortho Device/Splint Interventions: Application   Theodoro Parma Cammer 02/25/2014, 3:48 AM

## 2014-02-25 NOTE — Progress Notes (Signed)
Patient began feeling as if she was having a panic attack this am.  VSS.  Spoke with Benita Stabile PA for Dr. Marlou Sa, one time dose of Xanax 2mg  po.  Xanax given with positive effect.  Nursing will continue to monitor.

## 2014-02-26 ENCOUNTER — Encounter (HOSPITAL_COMMUNITY): Payer: Self-pay | Admitting: *Deleted

## 2014-02-26 MED ORDER — METHOCARBAMOL 500 MG PO TABS: 500.0000 mg | ORAL_TABLET | Freq: Four times a day (QID) | ORAL | Status: DC | PRN

## 2014-02-26 MED ORDER — OXYCODONE HCL ER 15 MG PO T12A: 15.0000 mg | EXTENDED_RELEASE_TABLET | Freq: Two times a day (BID) | ORAL | Status: DC

## 2014-02-26 MED ORDER — OXYCODONE HCL 5 MG PO TABS: 5.0000 mg | ORAL_TABLET | ORAL | Status: DC | PRN

## 2014-02-26 NOTE — Progress Notes (Signed)
Occupational Therapy Treatment Patient Details Name: Kristi [name] MRN: [id number] DOB: 11-06-1956 Today's Date: 02/26/2014    History of present illness Left shoulder rotator cuff tear   OT comments  Pt progressing with established goals.  Daughter demonstrated I with sling and with exercises and adls.  Pt tolerating more movement today although she is very lethargic and woozy possibly from meds.  Pt needs assist at all times when walking due to decreased balance.  Family states she has had balance issues for some time now and that is how she injured her shoulder (with a fall).  Mentioned having PT come see pt in hospital and family stated they did not want to overwhelm pt and that maybe they could do this after d/c.  Rec pt get some OPPT at some point to address balance issues and falls.   Follow Up Recommendations  Supervision/Assistance - 24 hour;Home health OT;Other (comment)    Equipment Recommendations  None recommended by OT    Recommendations for Other Services      Precautions / Restrictions Precautions Precautions: Fall Precaution Comments: family states pt has been unsteady on her feet for some time now.  pt may benefit from OPPT consult at some time if her balance issues continue. Required Braces or Orthoses: Sling Restrictions Weight Bearing Restrictions: No       Mobility Bed Mobility Overal bed mobility: Needs Assistance Bed Mobility: Supine to Sit;Sit to Supine     Supine to sit: Supervision Sit to supine: Supervision   General bed mobility comments: Much more I with bed mobility today.  Transfers Overall transfer level: Needs assistance Equipment used: 1 person hand held assist Transfers: Sit to/from [facility] Sit to Stand: Min assist Stand pivot transfers: Min assist       General transfer comment: pt continues to need outside support to transfer.  Daughter states that this is not unusual but that pt could walk Ily PTA.  Now, pt  needs outside assist.    Balance Overall balance assessment: Needs assistance Sitting-balance support: Single extremity supported;Feet supported Sitting balance-Leahy Scale: Poor Sitting balance - Comments: Pt very woozy today and had posterior lean when sitting on EOB Postural control: Posterior lean Standing balance support: Single extremity supported;During functional activity Standing balance-Leahy Scale: Poor Standing balance comment: Pt would lose balance and fall if she did not have external support.                   ADL Overall ADL's : Needs assistance/impaired Eating/Feeding: Set up;Sitting   Grooming: Wash/dry face;Oral care;Wash/dry hands;Standing;Minimal assistance Grooming Details (indicate cue type and reason): min assist for balance. Pt very woozy today due to meds. Upper Body Bathing: Moderate assistance;Sitting   Lower Body Bathing: Minimal assistance;Sit to/from stand Lower Body Bathing Details (indicate cue type and reason): min assist for sitting balance. Upper Body Dressing : Moderate assistance;Sitting;Adhering to UE precautions Upper Body Dressing Details (indicate cue type and reason): Pt needs assist starting shirt/assist with sling.  Daughter is proficient in assisting pt with adls. Lower Body Dressing: Moderate assistance;Sit to/from stand Lower Body Dressing Details (indicate cue type and reason): poor balance when standing. Toilet Transfer: Minimal assistance;Comfort height toilet Toilet Transfer Details (indicate cue type and reason): unsteady when coming sit to stand. Toileting- Clothing Manipulation and Hygiene: Minimal assistance;Sit to/from stand Toileting - Clothing Manipulation Details (indicate cue type and reason): min assist when standing and when sitting on commode for sitting balance.     Functional mobility during  ADLs: Minimal assistance General ADL Comments: Pt very woozy today with eyes shut sometimes during session.  Family feels  it is from medication change.  Pt needs assist with adls but daughter is able to assist as needed.      Vision                     Perception     Praxis      Cognition   Behavior During Therapy: Flat affect Overall Cognitive Status: Within Functional Limits for tasks assessed                       Extremity/Trunk Assessment               Exercises Shoulder Exercises Pendulum Exercise: PROM;Left;20 reps;Standing Elbow Flexion: AROM;20 reps;Seated Elbow Extension: AROM;10 reps;Seated;Left Wrist Flexion: AROM;Left;20 reps;Seated Wrist Extension: AROM;Left;20 reps;Seated Digit Composite Flexion: AROM;Left;15 reps;Seated Composite Extension: AROM;Left;15 reps;Seated Donning/doffing shirt without moving shoulder: Moderate assistance;Caregiver independent with task Method for sponge bathing under operated UE: Minimal assistance;Caregiver independent with task Donning/doffing sling/immobilizer: Moderate assistance;Caregiver independent with task Correct positioning of sling/immobilizer: Moderate assistance;Caregiver independent with task Pendulum exercises (written home exercise program): Moderate assistance;Caregiver independent with task ROM for elbow, wrist and digits of operated UE: Minimal assistance;Caregiver independent with task Sling wearing schedule (on at all times/off for ADL's): Moderate assistance;Caregiver independent with task Proper positioning of operated UE when showering: Moderate assistance;Caregiver independent with task Dressing change: Caregiver independent with task;Moderate assistance Positioning of UE while sleeping: Moderate assistance;Caregiver independent with task   Shoulder Instructions Shoulder Instructions Donning/doffing shirt without moving shoulder: Moderate assistance;Caregiver independent with task Method for sponge bathing under operated UE: Minimal assistance;Caregiver independent with task Donning/doffing  sling/immobilizer: Moderate assistance;Caregiver independent with task Correct positioning of sling/immobilizer: Moderate assistance;Caregiver independent with task Pendulum exercises (written home exercise program): Moderate assistance;Caregiver independent with task ROM for elbow, wrist and digits of operated UE: Minimal assistance;Caregiver independent with task Sling wearing schedule (on at all times/off for ADL's): Moderate assistance;Caregiver independent with task Proper positioning of operated UE when showering: Moderate assistance;Caregiver independent with task Dressing change: Caregiver independent with task;Moderate assistance Positioning of UE while sleeping: Moderate assistance;Caregiver independent with task     General Comments      Pertinent Vitals/ Pain       Pt c/o much less pain today but remains woozy and unsteady on her feet.  Home Living                                          Prior Functioning/Environment              Frequency Min 3X/week     Progress Toward Goals  OT Goals(current goals can now be found in the care plan section)  Progress towards OT goals: Progressing toward goals  Acute Rehab OT Goals Patient Stated Goal: gettting back to using my left arm OT Goal Formulation: With patient Time For Goal Achievement: 03/11/14 Potential to Achieve Goals: Good ADL Goals Pt Will Perform Upper Body Dressing: with min assist;sitting Pt Will Transfer to Toilet: with min assist;regular height toilet;bedside commode Pt Will Perform Toileting - Clothing Manipulation and hygiene: with min assist;sit to/from stand Pt/caregiver will Perform Home Exercise Program: Left upper extremity;With written HEP provided  Plan Discharge plan remains appropriate    Co-evaluation  End of Session     Activity Tolerance Patient limited by lethargy   Patient Left in chair;with call bell/phone within reach;with family/visitor  present   Nurse Communication Mobility status    Functional Assessment Tool Used: clinical observation Functional Limitation: Self care Self Care Current Status (H0623): At least 40 percent but less than 60 percent impaired, limited or restricted Self Care Goal Status (J6283): At least 1 percent but less than 20 percent impaired, limited or restricted   Time: 1040-1104 OT Time Calculation (min): 24 min  Charges: OT G-codes **NOT FOR INPATIENT CLASS** Functional Assessment Tool Used: clinical observation Functional Limitation: Self care Self Care Current Status (T5176): At least 40 percent but less than 60 percent impaired, limited or restricted Self Care Goal Status (H6073): At least 1 percent but less than 20 percent impaired, limited or restricted OT General Charges $OT Visit: 1 Procedure OT Treatments $Self Care/Home Management : 8-22 mins $Therapeutic Exercise: 8-22 mins  Vickki Muff 02/26/2014, 11:24 AM [phone]

## 2014-02-26 NOTE — Progress Notes (Signed)
Pts BP this a.m. Was 4/50 with a dynamap at 0514. BP rechecked at 0523 manually and read 88/60. Pt denies and feeling of dizziness, light headedness, weakness, or fainting. MD on call paged. PA [place] returned page and ordered for morning time BP medicine to be held. Will continue to monitor.

## 2014-02-26 NOTE — Progress Notes (Signed)
Pt stable Pain ok Plan dc today

## 2014-03-03 ENCOUNTER — Encounter: Payer: Self-pay | Admitting: Neurology

## 2014-03-10 NOTE — Discharge Summary (Signed)
Physician Discharge Summary  Patient ID: Kristi [name] MRN: [id number] DOB/AGE: 1956-02-09 58 y.o.  Admit date: 02/24/2014 Discharge date: 02/26/2014 Admission Diagnoses:  Active Problems:   Rotator cuff tear   Discharge Diagnoses:  Same  Surgeries: Procedure(s): SHOULDER ARTHROSCOPY WITH ROTATOR CUFF REPAIR AND SUBACROMIAL DECOMPRESSION on 02/24/2014   Consultants:    Discharged Condition: Stable  Hospital Course: Kristi [name] is an 58 y.o. female who was admitted 02/24/2014 with a chief complaint of left shoulder pain, and found to have a diagnosis of rotator cuff tear.  They were brought to the operating room on 02/24/2014 and underwent the above named procedures. She tolerated the procedure well and was started with occupational therapy in the hospital. Pain controlled on oral medication at the time of discharge. We'll continue him in CPM machine.    Antibiotics given:  Anti-infectives   Start     Dose/Rate Route Frequency Ordered Stop   02/24/14 2200  ceFAZolin (ANCEF) IVPB 2 g/50 mL premix     2 g 100 mL/hr over 30 Minutes Intravenous 3 times per day 02/24/14 2056 02/25/14 0603   02/24/14 0600  ceFAZolin (ANCEF) IVPB 2 g/50 mL premix     2 g 100 mL/hr over 30 Minutes Intravenous On call to O.R. 02/23/14 1412 02/24/14 1646    .  Recent vital signs:  Filed Vitals:   02/26/14 0523  BP: 88/60  Pulse:   Temp:   Resp:     Recent laboratory studies:  Results for orders placed in visit on 02/20/14  CBC WITH DIFFERENTIAL      Result Value Ref Range   WBC 3.5 (*) 3.9 - 10.3 10e3/uL   NEUT# 1.9  1.5 - 6.5 10e3/uL   HGB 10.3 (*) 11.6 - 15.9 g/dL   HCT 31.6 (*) 34.8 - 46.6 %   Platelets 132 (*) 145 - 400 10e3/uL   MCV 89.0  79.5 - 101.0 fL   MCH 29.1  25.1 - 34.0 pg   MCHC 32.7  31.5 - 36.0 g/dL   RBC 3.55 (*) 3.70 - 5.45 10e6/uL   RDW 15.7 (*) 11.2 - 14.5 %   lymph# 1.1  0.9 - 3.3 10e3/uL   MONO# 0.3  0.1 - 0.9 10e3/uL   Eosinophils Absolute 0.1  0.0 - 0.5  10e3/uL   Basophils Absolute 0.0  0.0 - 0.1 10e3/uL   NEUT% 54.2  38.4 - 76.8 %   LYMPH% 32.9  14.0 - 49.7 %   MONO% 9.6  0.0 - 14.0 %   EOS% 2.9  0.0 - 7.0 %   BASO% 0.4  0.0 - 2.0 %  COMPREHENSIVE METABOLIC PANEL (IR51)      Result Value Ref Range   Sodium 141  136 - 145 mEq/L   Potassium 3.9  3.5 - 5.1 mEq/L   Chloride 105  98 - 109 mEq/L   CO2 28  22 - 29 mEq/L   Glucose 114  70 - 140 mg/dl   BUN 12.1  7.0 - 26.0 mg/dL   Creatinine 0.8  0.6 - 1.1 mg/dL   Total Bilirubin 0.29  0.20 - 1.20 mg/dL   Alkaline Phosphatase 68  40 - 150 U/L   AST 29  5 - 34 U/L   ALT 34  0 - 55 U/L   Total Protein 6.6  6.4 - 8.3 g/dL   Albumin 3.5  3.5 - 5.0 g/dL   Calcium 9.5  8.4 - 10.4 mg/dL   Anion Gap 9  3 - 11 mEq/L    Discharge Medications:     Medication List    STOP taking these medications       HYDROcodone-acetaminophen 5-325 MG per tablet  Commonly known as:  NORCO/VICODIN      TAKE these medications       ALPRAZolam 1 MG tablet  Commonly known as:  XANAX  Take 2 mg by mouth at bedtime as needed for sleep.     amphetamine-dextroamphetamine 20 MG tablet  Commonly known as:  ADDERALL  Take 20 mg by mouth daily.     aspirin 81 MG EC tablet  Take 1 tablet (81 mg total) by mouth daily.     DULoxetine 60 MG capsule  Commonly known as:  CYMBALTA  Take 60 mg by mouth 2 (two) times daily.     gabapentin 800 MG tablet  Commonly known as:  NEURONTIN  Take 1 tablet (800 mg total) by mouth 3 (three) times daily.     ibuprofen 600 MG tablet  Commonly known as:  ADVIL,MOTRIN  Take 600 mg by mouth every 6 (six) hours as needed for moderate pain.     METANX 3-90.314-2-35 MG Caps     methocarbamol 500 MG tablet  Commonly known as:  ROBAXIN  Take 1 tablet (500 mg total) by mouth every 6 (six) hours as needed for muscle spasms.     multivitamin tablet  Take 1 tablet by mouth daily.     oxyCODONE 5 MG immediate release tablet  Commonly known as:  Oxy IR/ROXICODONE  Take 1-2  tablets (5-10 mg total) by mouth every 3 (three) hours as needed for breakthrough pain.     OxyCODONE 15 mg T12a 12 hr tablet  Commonly known as:  OXYCONTIN  Take 1 tablet (15 mg total) by mouth every 12 (twelve) hours.     SUPER B COMPLEX PO  Take 1 tablet by mouth daily.     tamoxifen 20 MG tablet  Commonly known as:  NOLVADEX  Take 20 mg by mouth daily.     valsartan-hydrochlorothiazide 320-25 MG per tablet  Commonly known as:  DIOVAN-HCT  Take 0.5 tablets by mouth daily.        Diagnostic Studies: Dg Chest 2 View  02/19/2014   CLINICAL DATA:  Pre operative respiratory exam.  Rotator cuff tear.  EXAM: CHEST  2 VIEW  COMPARISON:  Radiographs dated 02/04/2013 and 01/31/2013  FINDINGS: Power port in place. Heart size and pulmonary vascularity are normal and the lungs are clear. No osseous abnormality. Multiple surgical clips in the left breast and axilla.  IMPRESSION: No acute abnormalities.   Electronically Signed   By: Rozetta Nunnery M.D.   On: 02/19/2014 11:34    Disposition: 01-Home or Self Care      Discharge Orders   Future Appointments Provider Department Dept Phone   03/24/2014 2:00 PM Stark Klein, [facility] [place]   05/25/2014 1:30 PM Blair Promise, MD [facility] [phone]   05/28/2014 3:00 PM [facility] [phone]   05/28/2014 3:30 PM Deatra Robinson, MD [facility] [phone]   Future Orders Complete By Expires   Call MD / Call 911  As directed    Constipation Prevention  As directed    Diet - low sodium heart healthy  As directed    Discharge instructions  As directed    Increase activity slowly as  tolerated  As directed          Signed: Meredith Pel 03/10/2014, 7:31 AM

## 2014-03-11 ENCOUNTER — Ambulatory Visit: Payer: Self-pay | Admitting: Neurology

## 2014-03-19 ENCOUNTER — Ambulatory Visit: Payer: BC Managed Care – PPO | Attending: Orthopedic Surgery | Admitting: Rehabilitation

## 2014-03-19 DIAGNOSIS — C50219 Malignant neoplasm of upper-inner quadrant of unspecified female breast: Secondary | ICD-10-CM | POA: Insufficient documentation

## 2014-03-19 DIAGNOSIS — I10 Essential (primary) hypertension: Secondary | ICD-10-CM | POA: Insufficient documentation

## 2014-03-19 DIAGNOSIS — M25519 Pain in unspecified shoulder: Secondary | ICD-10-CM | POA: Insufficient documentation

## 2014-03-19 DIAGNOSIS — IMO0001 Reserved for inherently not codable concepts without codable children: Secondary | ICD-10-CM | POA: Diagnosis not present

## 2014-03-19 DIAGNOSIS — M25619 Stiffness of unspecified shoulder, not elsewhere classified: Secondary | ICD-10-CM | POA: Insufficient documentation

## 2014-03-23 ENCOUNTER — Ambulatory Visit: Payer: BC Managed Care – PPO | Admitting: Rehabilitation

## 2014-03-23 DIAGNOSIS — IMO0001 Reserved for inherently not codable concepts without codable children: Secondary | ICD-10-CM | POA: Diagnosis not present

## 2014-03-24 ENCOUNTER — Encounter (INDEPENDENT_AMBULATORY_CARE_PROVIDER_SITE_OTHER): Payer: Self-pay | Admitting: General Surgery

## 2014-03-24 ENCOUNTER — Ambulatory Visit (INDEPENDENT_AMBULATORY_CARE_PROVIDER_SITE_OTHER): Payer: BC Managed Care – PPO | Admitting: General Surgery

## 2014-03-24 VITALS — BP 118/76 | HR 83 | Temp 97.8°F | Ht 62.0 in | Wt 145.0 lb

## 2014-03-24 DIAGNOSIS — C50219 Malignant neoplasm of upper-inner quadrant of unspecified female breast: Secondary | ICD-10-CM

## 2014-03-24 NOTE — Patient Instructions (Signed)
Main risks are bleeding, infection, pain.    Outpatient operation.  No lifting for 1 week.  No shower for 2 days.

## 2014-03-25 ENCOUNTER — Ambulatory Visit: Payer: BC Managed Care – PPO | Admitting: Rehabilitation

## 2014-03-25 ENCOUNTER — Telehealth (INDEPENDENT_AMBULATORY_CARE_PROVIDER_SITE_OTHER): Payer: Self-pay

## 2014-03-25 DIAGNOSIS — IMO0001 Reserved for inherently not codable concepts without codable children: Secondary | ICD-10-CM | POA: Diagnosis not present

## 2014-03-25 NOTE — Telephone Encounter (Signed)
Returning pt's phone call regarding port-a-cath.  Please ask for Kristi [name].

## 2014-03-26 ENCOUNTER — Ambulatory Visit: Payer: BC Managed Care – PPO | Admitting: Rehabilitation

## 2014-03-26 DIAGNOSIS — IMO0001 Reserved for inherently not codable concepts without codable children: Secondary | ICD-10-CM | POA: Diagnosis not present

## 2014-03-28 NOTE — Progress Notes (Signed)
HISTORY: Patient is a 58-year-old female approximately 13 months status post left lumpectomy and axillary lymph node dissection for a multifocal left breast cancer. Pt underwent rotator cuff surgery and has been having physical therapy.  She is still a bit tearful, but much improved that last few visits.  She has not found any new breast masses.  She got her 1 year mammo in April of this year, and this was negative.     PERTINENT REVIEW OF SYSTEMS: Otherwise negative.    Filed Vitals:   03/24/14 1434  BP: 118/76  Pulse: 83  Temp: 97.8 F (36.6 C)   Filed Weights   03/24/14 1434  Weight: 145 lb (65.772 kg)    EXAM: Head: Normocephalic and atraumatic.  Eyes:  Conjunctivae are normal. Pupils are equal, round, and reactive to light. No scleral icterus.  Neck:  Normal range of motion. Neck supple. No tracheal deviation present. No thyromegaly present.  Resp: No respiratory distress, normal effort. Breast:  Left breast has some hyperpigmentation and skin edema.   No masses palpable.  Contracture of left axilla has improved, but is still present. She has tenderness of breast and superior shoulder.     Abd:  Abdomen is soft, non distended and non tender. No masses are palpable.  There is no rebound and no guarding.  Neurological: Alert and oriented to person, place, and time. Coordination normal.  Skin: Skin is warm and dry. No rash noted. No diaphoretic. No erythema. No pallor.  Psychiatric: Normal mood and affect. Normal behavior. Judgment and thought content normal.      ASSESSMENT AND PLAN:   Cancer of upper-inner quadrant of female breast Pt ready for port removal.  No clinical evidence of disease.    Discussed risks and benefits.        Brice Kossman L Molly Maselli, MD Surgical Oncology, General & Endocrine Surgery [facility]  [name],[name], MD [name], [name], MD   

## 2014-03-28 NOTE — Assessment & Plan Note (Signed)
Pt ready for port removal.  No clinical evidence of disease.    Discussed risks and benefits.

## 2014-03-31 ENCOUNTER — Ambulatory Visit: Payer: BC Managed Care – PPO | Admitting: Rehabilitation

## 2014-03-31 DIAGNOSIS — IMO0001 Reserved for inherently not codable concepts without codable children: Secondary | ICD-10-CM | POA: Diagnosis not present

## 2014-04-01 ENCOUNTER — Ambulatory Visit: Payer: BC Managed Care – PPO | Admitting: Rehabilitation

## 2014-04-02 ENCOUNTER — Encounter (HOSPITAL_COMMUNITY): Payer: Self-pay | Admitting: Pharmacy Technician

## 2014-04-06 ENCOUNTER — Ambulatory Visit: Payer: BC Managed Care – PPO | Attending: Orthopedic Surgery | Admitting: Rehabilitation

## 2014-04-06 DIAGNOSIS — M25619 Stiffness of unspecified shoulder, not elsewhere classified: Secondary | ICD-10-CM | POA: Insufficient documentation

## 2014-04-06 DIAGNOSIS — I10 Essential (primary) hypertension: Secondary | ICD-10-CM | POA: Diagnosis not present

## 2014-04-06 DIAGNOSIS — C50219 Malignant neoplasm of upper-inner quadrant of unspecified female breast: Secondary | ICD-10-CM | POA: Insufficient documentation

## 2014-04-06 DIAGNOSIS — IMO0001 Reserved for inherently not codable concepts without codable children: Secondary | ICD-10-CM | POA: Insufficient documentation

## 2014-04-06 DIAGNOSIS — M25519 Pain in unspecified shoulder: Secondary | ICD-10-CM | POA: Diagnosis not present

## 2014-04-07 ENCOUNTER — Encounter (HOSPITAL_COMMUNITY): Payer: Self-pay

## 2014-04-07 ENCOUNTER — Encounter (HOSPITAL_COMMUNITY)
Admission: RE | Admit: 2014-04-07 | Discharge: 2014-04-07 | Disposition: A | Discharge status: Home / Self Care | Payer: BC Managed Care – PPO | Source: Ambulatory Visit | Attending: General Surgery | Admitting: General Surgery

## 2014-04-07 ENCOUNTER — Ambulatory Visit: Payer: Self-pay | Admitting: Neurology

## 2014-04-07 ENCOUNTER — Encounter (INDEPENDENT_AMBULATORY_CARE_PROVIDER_SITE_OTHER): Payer: Self-pay

## 2014-04-07 ENCOUNTER — Other Ambulatory Visit (HOSPITAL_COMMUNITY): Payer: BC Managed Care – PPO

## 2014-04-07 DIAGNOSIS — Z01812 Encounter for preprocedural laboratory examination: Secondary | ICD-10-CM | POA: Insufficient documentation

## 2014-04-07 LAB — CBC
HCT: 32.2 % — ABNORMAL LOW (ref 36.0–46.0)
Hemoglobin: 10.7 g/dL — ABNORMAL LOW (ref 12.0–15.0)
MCH: 29.1 pg (ref 26.0–34.0)
MCHC: 33.2 g/dL (ref 30.0–36.0)
MCV: 87.5 fL (ref 78.0–100.0)
Platelets: 154 10*3/uL (ref 150–400)
RBC: 3.68 MIL/uL — ABNORMAL LOW (ref 3.87–5.11)
RDW: 14.4 % (ref 11.5–15.5)
WBC: 3.7 10*3/uL — ABNORMAL LOW (ref 4.0–10.5)

## 2014-04-07 LAB — BASIC METABOLIC PANEL
BUN: 13 mg/dL (ref 6–23)
CALCIUM: 9.5 mg/dL (ref 8.4–10.5)
CO2: 30 mEq/L (ref 19–32)
Chloride: 106 mEq/L (ref 96–112)
Creatinine, Ser: 0.78 mg/dL (ref 0.50–1.10)
GFR calc Af Amer: >90 mL/min (ref >90)
GFR, EST NON AFRICAN AMERICAN: 90 mL/min — AB (ref >90)
GLUCOSE: 94 mg/dL (ref 70–99)
Potassium: 4 mEq/L (ref 3.7–5.3)
SODIUM: 144 meq/L (ref 137–147)

## 2014-04-07 LAB — SURGICAL PCR SCREEN
MRSA, PCR: NEGATIVE
Staphylococcus aureus: NEGATIVE

## 2014-04-07 NOTE — Patient Instructions (Addendum)
[place]  04/07/2014   Your procedure is scheduled on: 6-11  -2015  Enter through [facility] Entrance and follow signs to [facility]. Arrive at    0800    AM .  Call this number if you have problems the morning of surgery: [phone]  Or Presurgical Testing [phone](Lesley Atkin) For Living Will and/or Health Care Power Attorney Forms: please provide copy for your medical record,may bring AM of surgery(Forms should be already notarized -we do not provide this service).(04-07-14  No information preferred today).      Do not eat food:After Midnight.    Take these medicines the morning of surgery with A SIP OF WATER: Duloxetine. Gabapentin. Tamoxifen. Oxycodone. Stop Aspirin x7 days prior per MD instructions.   Do not wear jewelry, make-up or nail polish.  Do not wear lotions, powders, or perfumes. You may wear deodorant.  Do not shave 48 hours(2 days) prior to first CHG shower(legs and under arms).(Shaving face and neck okay.)  Do not bring valuables to the hospital.(Hospital is not responsible for lost valuables).  Contacts, dentures or removable bridgework, body piercing, hair pins may not be worn into surgery.  Leave suitcase in the car. After surgery it may be brought to your room.  For patients admitted to the hospital, checkout time is 11:00 AM the day of discharge.(Restricted visitors-Any Persons displaying flu-like symptoms or illness).    Patients discharged the day of surgery will not be allowed to drive home. Must have responsible person with you x 24 hours once discharged.  Name and phone number of your driver: Marcia Brash. Daughter- [phone] cell  Special Instructions: CHG(Chlorhedine 4%-"Hibiclens","Betasept","Aplicare") Shower Use Special Wash: see special instructions.(avoid face and genitals)      Failure to follow these instructions may result in Cancellation of your surgery.   ______________    [facility] - Preparing for Surgery       Before surgery, you can play an important role.  Because skin is not sterile, your skin needs to be as free of germs as possible.  You can reduce the number of germs on your skin by washing with CHG (chlorahexidine gluconate) soap before surgery.  CHG is an antiseptic cleaner which kills germs and bonds with the skin to continue killing germs even after washing. Please DO NOT use if you have an allergy to CHG or antibacterial soaps.  If your skin becomes reddened/irritated stop using the CHG and inform your nurse when you arrive at Short Stay. Do not shave (including legs and underarms) for at least 48 hours prior to the first CHG shower.  You may shave your face/neck. Please follow these instructions carefully:  1.  Shower with CHG Soap the night before surgery and the  morning of Surgery.  2.  If you choose to wash your hair, wash your hair first as usual with your  normal  shampoo.  3.  After you shampoo, rinse your hair and body thoroughly to remove the  shampoo.                           4.  Use CHG as you would any other liquid soap.  You can apply chg directly  to the skin and wash                       Gently with a scrungie or clean washcloth.  5.  Apply the CHG  Soap to your body ONLY FROM THE NECK DOWN.   Do not use on face/ open                           Wound or open sores. Avoid contact with eyes, ears mouth and genitals (private parts).                       Wash face,  Genitals (private parts) with your normal soap.             6.  Wash thoroughly, paying special attention to the area where your surgery  will be performed.  7.  Thoroughly rinse your body with warm water from the neck down.  8.  DO NOT shower/wash with your normal soap after using and rinsing off  the CHG Soap.                9.  Pat yourself dry with a clean towel.            10.  Wear clean pajamas.            11.  Place clean sheets on your bed the night of your first shower and do not  sleep with pets. Day of  Surgery : Do not apply any lotions/deodorants the morning of surgery.  Please wear clean clothes to the hospital/surgery center.  FAILURE TO FOLLOW THESE INSTRUCTIONS MAY RESULT IN THE CANCELLATION OF YOUR SURGERY PATIENT SIGNATURE_________________________________  NURSE SIGNATURE__________________________________  ________________________________________________________________________

## 2014-04-07 NOTE — Pre-Procedure Instructions (Signed)
04-07-14 EKG, 9'14, Echo 9'14/ CXR 4'15 Epic

## 2014-04-08 ENCOUNTER — Ambulatory Visit: Payer: BC Managed Care – PPO | Admitting: Rehabilitation

## 2014-04-08 DIAGNOSIS — IMO0001 Reserved for inherently not codable concepts without codable children: Secondary | ICD-10-CM | POA: Diagnosis not present

## 2014-04-10 ENCOUNTER — Ambulatory Visit: Payer: BC Managed Care – PPO | Admitting: Rehabilitation

## 2014-04-10 DIAGNOSIS — IMO0001 Reserved for inherently not codable concepts without codable children: Secondary | ICD-10-CM | POA: Diagnosis not present

## 2014-04-13 ENCOUNTER — Telehealth: Payer: Self-pay | Admitting: Neurology

## 2014-04-13 ENCOUNTER — Ambulatory Visit: Payer: BC Managed Care – PPO | Admitting: Rehabilitation

## 2014-04-13 NOTE — Telephone Encounter (Signed)
Spoke with daughter, patient is (not sleeping has contacted Psychiatrist to address), but patient feet feel as though they are on fire, she gave patient oxycodone that she had from previous rotator cuff surgery,along with her gabapentin, not working.

## 2014-04-13 NOTE — Telephone Encounter (Signed)
Daughter calling requesting an earlier appointment then 6/10.  Mothers feet feel like they are on fire and have gotten progressively worst in the last couple of days.  Mothers in tears.  Please call and advise.

## 2014-04-13 NOTE — Telephone Encounter (Signed)
Please call and schedule her to be seen with Cecille Rubin tomorrow. Thanks.

## 2014-04-13 NOTE — Telephone Encounter (Signed)
Called patient to schedule her appointment with NP Cecille Rubin on tomorrow, no answer left message to call back before 5 pm.

## 2014-04-14 ENCOUNTER — Telehealth: Payer: Self-pay | Admitting: *Deleted

## 2014-04-14 NOTE — Telephone Encounter (Signed)
Called patient to r/s appointment with NP Cecille Rubin, left message for her to call back to have appointment scheduled, appointment with Dr. Janann Colonel for 04/16/14 has not been cancelled.

## 2014-04-15 ENCOUNTER — Ambulatory Visit: Payer: BC Managed Care – PPO | Admitting: Rehabilitation

## 2014-04-15 ENCOUNTER — Ambulatory Visit (INDEPENDENT_AMBULATORY_CARE_PROVIDER_SITE_OTHER): Payer: BC Managed Care – PPO | Admitting: Neurology

## 2014-04-15 ENCOUNTER — Encounter (INDEPENDENT_AMBULATORY_CARE_PROVIDER_SITE_OTHER): Payer: Self-pay

## 2014-04-15 ENCOUNTER — Encounter: Payer: Self-pay | Admitting: Neurology

## 2014-04-15 VITALS — BP 120/78 | HR 84 | Ht 62.0 in | Wt 163.0 lb

## 2014-04-15 DIAGNOSIS — G609 Hereditary and idiopathic neuropathy, unspecified: Secondary | ICD-10-CM

## 2014-04-15 DIAGNOSIS — IMO0001 Reserved for inherently not codable concepts without codable children: Secondary | ICD-10-CM | POA: Diagnosis not present

## 2014-04-15 MED ORDER — CARBAMAZEPINE 200 MG PO TABS: 100.0000 mg | ORAL_TABLET | Freq: Two times a day (BID) | ORAL | Status: DC

## 2014-04-15 NOTE — Patient Instructions (Signed)
Overall you are doing fairly well but I do want to suggest a few things today:   Remember to drink plenty of fluid, eat healthy meals and do not skip any meals. Try to eat protein with a every meal and eat a healthy snack such as fruit or nuts in between meals. Try to keep a regular sleep-wake schedule and try to exercise daily, particularly in the form of walking, 20-30 minutes a day, if you can.   As far as your medications are concerned, I would like to suggest the following: 1)Please start tegretol 100mg  twice a day (1/2 tablet twice a day)  As far as diagnostic testing:  1)Please schedule an EMG/Nerve conduction study when you check out  Follow up in 6 months. Please call us with any interim questions, concerns, problems, updates or refill requests.   Please also call us for any test results so we can go over those with you on the phone.  My clinical assistant and will answer any of your questions and relay your messages to me and also relay most of my messages to you.   Our phone number is [phone]. We also have an after hours call service for urgent matters and there is a physician on-call for urgent questions. For any emergencies you know to call 911 or go to the nearest emergency room

## 2014-04-15 NOTE — Progress Notes (Signed)
[facility]    Provider:  Dr Janann Colonel Referring Provider: Ron Parker, MD Primary Care Physician:  Ron Parker, MD  CC:  Foot pain  HPI:  Kristi [name] is a 58 y.o. female here as a follow up from Dr. Hassell Done for evaluation of foot pain. Since last visit she was tried on Gabapentin and Lyrica with no benefit. Returns today with recent episode of severe excruciating pain in her feet. Notes recent severe cramping burning sensation in her feet. Recently daughter found her crying due to the severe pain.   Currently taking Cymbalta 60mg  BID, Neurontin 800mg  TID. Also taking Oxycodone IR and Oxycontin which she was given recently for a rotator cuff repair.    Initial visit: 57y/o woman with hx of breast CA, currently receiving RT with history of getting Adriamycin/cytoxan, Taxol (had allergic reaction) and Gemzar in the past. Is also s/p lumpectomy on 02/2013. Notes pain in soles of her feet, they feel like they have "shriveled up". Symptoms started towards end of chemotherapy, ended October 2014. Has progressed/worsened since chemotherapy. When she stands up gets increase in pain. Feet are very sensitive to shoes and socks. Has tried multiple different types of shoes to see if she could find a type that relieves the pain. Very sensitive to the cold. Pain is worse at night. Has caused some difficulty walking due to imbalance though she attributes the gait instability more to the pain. Currently taking Neurontin 300mg  QID. (takes 2 tablets qhs) This has given mild relief but no resolution of symptoms.   No symptoms in bilateral upper extremities.   Reviewed notes, labs and imaging from outside physicians, which were unremarkable.   Review of Systems: Out of a complete 14 system review, the patient complains of only the following symptoms, and all other reviewed systems are negative. Positive for fatigue feeling hot feeling cold numbness sleepiness  depression anxiety  difficulty falling asleep racing thoughts  History   Social History  . Marital Status: Married    Spouse Name: [name]    Number of Children: 1  . Years of Education: 12   Occupational History  . Kristi [name]   Social History Main Topics  . Smoking status: Never Smoker   . Smokeless tobacco: Never Used  . Alcohol Use: No  . Drug Use: No  . Sexual Activity: Yes   Other Topics Concern  . Not on file   Social History Narrative   Patient is married [name]) and lives at home with her husband.   Patient has one daughter.   Patient is working at [employer]   Patient has a 12th grade education.   Patient drinks very little caffeine.    Family History  Problem Relation Age of Onset  . Lung cancer Father   . Hypertension Father   . Thyroid cancer Father     dx in his 71s  . Breast cancer Paternal Aunt 37  . Colon cancer Paternal Aunt     dx in her 50x  . Cervical cancer Paternal Aunt     dzx in her 38s  . Ovarian cancer Cousin     dx in her lage 54s  . Breast cancer Cousin     maternal first cousin, once removed; dx in her late 41s  . Breast cancer Cousin     maternal first cousin once removed; dx in late 24s  . Hypertension Mother   . Diabetes Mother   . Dementia Mother   .  Hypertension Brother   . Seizures Brother     Alcohol induced.  . Cancer Paternal Uncle     oral cancer  . Kidney cancer Paternal Grandmother   . Cancer Cousin     several paternal cousins with brain cancer, leukemia, and other cancers    Past Medical History  Diagnosis Date  . Anxiety   . Hypertension   . MRSA (methicillin resistant Staphylococcus aureus) 2009    right groin area-no issues now. 04-07-14 PCR screen negative today.  . Depression   . Anemia     Iron deficinecy anemia  . History of radiation therapy 09/09/13-10/28/13    45 gray to left breast, lumpectomy cavity boosted to 63 gray  . Neuropathy   . Arthritis   . Breast cancer     left ,last  radiation 2'15, last chemo 8'14    Past Surgical History  Procedure Laterality Date  . Anal sphincterotomy  04/2011  . Hemorrhoid surgery  04/2011    ligation  . Breast lumpectomy with needle localization Left 02/04/2013    Procedure: LEFT BREAST LUMPECTOMY WITH NEEDLE LOCALIZATION;  Surgeon: Stark Klein, MD;  Location: [place];  Service: General;  Laterality: Left;  . Axillary lymph node dissection Left 02/04/2013    Procedure: LEFT AXILLARY LYMPH NODE DISSECTION;  Surgeon: Stark Klein, MD;  Location: [place];  Service: General;  Laterality: Left;  End: 3614  . Portacath placement Right 02/04/2013    Procedure: INSERTION PORT-A-CATH;  Surgeon: Stark Klein, MD;  Location: [place];  Service: General;  Laterality: Right;  Start Time: 4315.  Marland Kitchen Appendectomy  1980  . Breast surgery      Lumpectomy in april 2014  . Shoulder arthroscopy with rotator cuff repair and subacromial decompression Left 02/24/2014    Procedure: SHOULDER ARTHROSCOPY WITH ROTATOR CUFF REPAIR AND SUBACROMIAL DECOMPRESSION;  Surgeon: Meredith Pel, MD;  Location: [place];  Service: Orthopedics;  Laterality: Left;  LEFT SHOULDER DIAGNOSTIC OPERATIVE ARTHROSCOPY, SUBACROMIAL DECOMPRESSION, ROTATOR CUFF TEAR REPAIR    Current Outpatient Prescriptions  Medication Sig Dispense Refill  . ALPRAZolam (XANAX) 1 MG tablet Take 1-2 mg by mouth 3 (three) times daily. 1 tablet twice daily then 2 tablets at bedtime      . aspirin EC 81 MG tablet Take 162 mg by mouth daily.      . B Complex-C (SUPER B COMPLEX PO) Take 1 tablet by mouth daily.      . clonazePAM (KLONOPIN) 2 MG tablet Take 2 mg by mouth at bedtime as needed (Sleep).      . DULoxetine (CYMBALTA) 60 MG capsule Take 60 mg by mouth 2 (two) times daily.      Marland Kitchen gabapentin (NEURONTIN) 800 MG tablet Take 1 tablet (800 mg total) by mouth 3 (three) times daily.  90 tablet  6  . methocarbamol (ROBAXIN) 500 MG tablet Take 1 tablet (500 mg total) by mouth every 6 (six) hours as needed for  muscle spasms.  30 tablet  0  . Multiple Vitamin (MULTIVITAMIN WITH MINERALS) TABS tablet Take 1 tablet by mouth daily.      Marland Kitchen oxyCODONE (OXY IR/ROXICODONE) 5 MG immediate release tablet Take 1-2 tablets (5-10 mg total) by mouth every 3 (three) hours as needed for breakthrough pain.  60 tablet  0  . OxyCODONE (OXYCONTIN) 15 mg T12A 12 hr tablet Take 15 mg by mouth 2 (two) times daily as needed (Pain).      . tamoxifen (NOLVADEX) 20 MG tablet Take 20  mg by mouth daily.      . valsartan-hydrochlorothiazide (DIOVAN-HCT) 320-25 MG per tablet Take 1 tablet by mouth every morning.       . [DISCONTINUED] prochlorperazine (COMPAZINE) 10 MG tablet Take 1 tablet (10 mg total) by mouth every 6 (six) hours as needed (Nausea or vomiting).  30 tablet  1  . [DISCONTINUED] prochlorperazine (COMPAZINE) 25 MG suppository Place 1 suppository (25 mg total) rectally every 12 (twelve) hours as needed for nausea.  12 suppository  3   No current facility-administered medications for this visit.    Allergies as of 04/15/2014  . (No Known Allergies)    Vitals: LMP 01/20/2013 Last Weight:  Wt Readings from Last 1 Encounters:  04/07/14 148 lb (67.132 kg)   Last Height:   Ht Readings from Last 1 Encounters:  04/07/14 5\' 2"  (1.575 m)     Physical exam: Exam: Gen: NAD, conversant Eyes: anicteric sclerae, moist conjunctivae HENT: Atraumatic, oropharynx clear Neck: Trachea midline; supple,  Lungs: CTA, no wheezing, rales, rhonic                          CV: RRR, no MRG Abdomen: Soft, non-tender;  Extremities: No peripheral edema  Skin: Normal temperature, no rash,  Psych: Appropriate affect, pleasant  Neuro: MS: AA&Ox3, appropriately interactive, normal affect   Speech: fluent w/o paraphasic error  Memory: good recent and remote recall  CN: PERRL, EOMI no nystagmus, no ptosis, sensation intact to LT V1-V3 bilat, face symmetric, no weakness, hearing grossly intact, palate elevates symmetrically,  shoulder shrug 5/5 bilat,  tongue protrudes midline, no fasiculations noted.  Motor: normal bulk and tone Strength: 5/5  In all extremities  Coord: rapid alternating and point-to-point (FNF, HTS) movements intact.  Reflexes: symmetrical, bilat downgoing toes  Sens: LT intact in all extremities, intact LT, PP, temp, vibration and proprioception in bilateral lower extremities  Gait: posture, stance, stride and arm-swing normal. Tandem gait intact. Able to walk on heels and toes. Romberg absent.   Assessment:  After physical and neurologic examination, review of laboratory studies, imaging, neurophysiology testing and pre-existing records, assessment will be reviewed on the problem list.  Plan:  Treatment plan and additional workup will be reviewed under Problem List.  1)Peripheral neuropathy: likely chemotherapy related  Ms. Jacobowitz is a pleasant 58 year old woman with history of breast cancer, status post chemotherapy and radiation, presenting for follow up evaluation of bilateral lower extremity foot paresthesias and pain. The symptoms began shortly after she finished her course of chemotherapy. Main symptom is pain which is affecting her quality of life, causing difficulty walking. She is currently trying gabapentin 800 mg  times a day and was started on Cymbalta 60mg  BID by her psychiatrist, and has tried multiple different shoes with no benefit. Physical exam is overall unremarkable. Suspect her symptoms are most likely re;ated to her chemotherapy. Due to worsening of features will check EMG/NCS. Patient is on multiple pain medications with no benefit. Will try tegretol 100mg  twice a day. If no improvement will consider referral to pain clinic for optimization of pain medication. Followup in 4-6 months.  Jim Like, DO  [facility] [address] [place], [place] [address]  Phone [phone] Fax [phone]

## 2014-04-15 NOTE — Pre-Procedure Instructions (Signed)
04-15-14 0900 Voice message left [phone], making pt. Aware of surgery time change to 0900 AM and to arrive at 0700 AM to Short Stay.W. Floy Sabina

## 2014-04-16 ENCOUNTER — Ambulatory Visit (HOSPITAL_COMMUNITY)
Admission: RE | Admit: 2014-04-16 | Discharge: 2014-04-16 | Disposition: A | Discharge status: Home / Self Care | Payer: BC Managed Care – PPO | Source: Ambulatory Visit | Attending: General Surgery | Admitting: General Surgery

## 2014-04-16 ENCOUNTER — Encounter (HOSPITAL_COMMUNITY)
Admission: RE | Disposition: A | Discharge status: Home / Self Care | Payer: Self-pay | Source: Ambulatory Visit | Attending: General Surgery

## 2014-04-16 ENCOUNTER — Ambulatory Visit (HOSPITAL_COMMUNITY): Payer: BC Managed Care – PPO | Admitting: Anesthesiology

## 2014-04-16 ENCOUNTER — Encounter (HOSPITAL_COMMUNITY): Payer: BC Managed Care – PPO | Admitting: Anesthesiology

## 2014-04-16 ENCOUNTER — Encounter (HOSPITAL_COMMUNITY): Payer: Self-pay | Admitting: General Surgery

## 2014-04-16 DIAGNOSIS — Z853 Personal history of malignant neoplasm of breast: Secondary | ICD-10-CM | POA: Insufficient documentation

## 2014-04-16 DIAGNOSIS — Z452 Encounter for adjustment and management of vascular access device: Secondary | ICD-10-CM | POA: Insufficient documentation

## 2014-04-16 HISTORY — PX: PORT-A-CATH REMOVAL: SHX5289

## 2014-04-16 SURGERY — REMOVAL PORT-A-CATH
Anesthesia: General

## 2014-04-16 MED ORDER — SODIUM CHLORIDE 0.9 % IV SOLN: 250.0000 mL | INTRAVENOUS | Status: DC | PRN

## 2014-04-16 MED ORDER — CEFAZOLIN SODIUM-DEXTROSE 2-3 GM-% IV SOLR
INTRAVENOUS | Status: AC
  Filled 2014-04-16: qty 50

## 2014-04-16 MED ORDER — ONDANSETRON HCL 4 MG/2ML IJ SOLN
INTRAMUSCULAR | Status: AC
  Filled 2014-04-16: qty 2

## 2014-04-16 MED ORDER — PROPOFOL 10 MG/ML IV BOLUS
INTRAVENOUS | Status: AC
  Filled 2014-04-16: qty 20

## 2014-04-16 MED ORDER — OXYCODONE HCL 5 MG PO TABS: 5.0000 mg | ORAL_TABLET | ORAL | Status: DC | PRN

## 2014-04-16 MED ORDER — CEFAZOLIN SODIUM-DEXTROSE 2-3 GM-% IV SOLR
2.0000 g | INTRAVENOUS | Status: AC
  Administered 2014-04-16: 2 g via INTRAVENOUS

## 2014-04-16 MED ORDER — MIDAZOLAM HCL 5 MG/5ML IJ SOLN
INTRAMUSCULAR | Status: DC | PRN
  Administered 2014-04-16: 2 mg via INTRAVENOUS

## 2014-04-16 MED ORDER — LACTATED RINGERS IV SOLN
INTRAVENOUS | Status: DC
  Administered 2014-04-16: 1000 mL via INTRAVENOUS

## 2014-04-16 MED ORDER — LIDOCAINE HCL 1 % IJ SOLN
INTRAMUSCULAR | Status: AC
  Filled 2014-04-16: qty 20

## 2014-04-16 MED ORDER — ONDANSETRON HCL 4 MG/2ML IJ SOLN
INTRAMUSCULAR | Status: DC | PRN
  Administered 2014-04-16: 4 mg via INTRAVENOUS

## 2014-04-16 MED ORDER — MIDAZOLAM HCL 2 MG/2ML IJ SOLN
INTRAMUSCULAR | Status: AC
  Filled 2014-04-16: qty 2

## 2014-04-16 MED ORDER — SODIUM CHLORIDE 0.9 % IJ SOLN: 3.0000 mL | INTRAMUSCULAR | Status: DC | PRN

## 2014-04-16 MED ORDER — LACTATED RINGERS IV SOLN: INTRAVENOUS | Status: DC

## 2014-04-16 MED ORDER — ACETAMINOPHEN 325 MG PO TABS: 650.0000 mg | ORAL_TABLET | ORAL | Status: DC | PRN

## 2014-04-16 MED ORDER — FENTANYL CITRATE 0.05 MG/ML IJ SOLN: 25.0000 ug | INTRAMUSCULAR | Status: DC | PRN

## 2014-04-16 MED ORDER — LACTATED RINGERS IV SOLN
INTRAVENOUS | Status: DC | PRN
  Administered 2014-04-16: 08:00:00 via INTRAVENOUS

## 2014-04-16 MED ORDER — PROPOFOL 10 MG/ML IV BOLUS
INTRAVENOUS | Status: DC | PRN
  Administered 2014-04-16 (×2): 30 mg via INTRAVENOUS
  Administered 2014-04-16 (×2): 20 mg via INTRAVENOUS

## 2014-04-16 MED ORDER — ACETAMINOPHEN 650 MG RE SUPP
650.0000 mg | RECTAL | Status: DC | PRN
  Filled 2014-04-16: qty 1

## 2014-04-16 MED ORDER — SODIUM CHLORIDE 0.9 % IJ SOLN: 3.0000 mL | Freq: Two times a day (BID) | INTRAMUSCULAR | Status: DC

## 2014-04-16 MED ORDER — BUPIVACAINE-EPINEPHRINE 0.25% -1:200000 IJ SOLN
INTRAMUSCULAR | Status: DC | PRN
  Administered 2014-04-16: 30 mL

## 2014-04-16 MED ORDER — FENTANYL CITRATE 0.05 MG/ML IJ SOLN
INTRAMUSCULAR | Status: AC
  Filled 2014-04-16: qty 2

## 2014-04-16 MED ORDER — LIDOCAINE HCL (PF) 1 % IJ SOLN
INTRAMUSCULAR | Status: DC | PRN
  Administered 2014-04-16: 20 mL

## 2014-04-16 MED ORDER — BUPIVACAINE-EPINEPHRINE (PF) 0.25% -1:200000 IJ SOLN
INTRAMUSCULAR | Status: AC
  Filled 2014-04-16: qty 30

## 2014-04-16 MED ORDER — FENTANYL CITRATE 0.05 MG/ML IJ SOLN
INTRAMUSCULAR | Status: DC | PRN
Start: 2014-04-16 — End: 2014-04-16
  Administered 2014-04-16: 50 ug via INTRAVENOUS

## 2014-04-16 SURGICAL SUPPLY — 40 items
ADH SKN CLS APL DERMABOND .7 (GAUZE/BANDAGES/DRESSINGS) ×1
APL SKNCLS STERI-STRIP NONHPOA (GAUZE/BANDAGES/DRESSINGS)
BENZOIN TINCTURE PRP APPL 2/3 (GAUZE/BANDAGES/DRESSINGS) ×1 IMPLANT
BLADE SURG 15 STRL LF DISP TIS (BLADE) ×1 IMPLANT
BLADE SURG 15 STRL SS (BLADE) ×2
CANISTER SUCTION 2500CC (MISCELLANEOUS) ×1 IMPLANT
CHLORAPREP W/TINT 10.5 ML (MISCELLANEOUS) ×2 IMPLANT
DECANTER SPIKE VIAL GLASS SM (MISCELLANEOUS) ×2 IMPLANT
DERMABOND ADVANCED (GAUZE/BANDAGES/DRESSINGS) ×1
DERMABOND ADVANCED .7 DNX12 (GAUZE/BANDAGES/DRESSINGS) IMPLANT
DRAPE LAPAROTOMY TRNSV 102X78 (DRAPE) ×1 IMPLANT
ELECT COATED BLADE 2.86 ST (ELECTRODE) IMPLANT
ELECT REM PT RETURN 9FT ADLT (ELECTROSURGICAL) ×2
ELECTRODE REM PT RTRN 9FT ADLT (ELECTROSURGICAL) ×1 IMPLANT
GAUZE SPONGE 4X4 12PLY STRL (GAUZE/BANDAGES/DRESSINGS) ×1 IMPLANT
GAUZE SPONGE 4X4 16PLY XRAY LF (GAUZE/BANDAGES/DRESSINGS) ×1 IMPLANT
GLOVE BIO SURGEON STRL SZ 6 (GLOVE) ×2 IMPLANT
GLOVE BIO SURGEON STRL SZ 6.5 (GLOVE) IMPLANT
GLOVE BIOGEL PI IND STRL 6.5 (GLOVE) ×1 IMPLANT
GLOVE BIOGEL PI IND STRL 7.0 (GLOVE) ×1 IMPLANT
GLOVE BIOGEL PI INDICATOR 6.5 (GLOVE) ×1
GLOVE BIOGEL PI INDICATOR 7.0 (GLOVE) ×1
GLOVE INDICATOR 6.5 STRL GRN (GLOVE) ×4 IMPLANT
GOWN SPEC L4 XLG W/TWL (GOWN DISPOSABLE) ×3 IMPLANT
GOWN STRL REUS W/ TWL XL LVL3 (GOWN DISPOSABLE) ×3 IMPLANT
GOWN STRL REUS W/TWL 2XL LVL3 (GOWN DISPOSABLE) ×2 IMPLANT
GOWN STRL REUS W/TWL XL LVL3 (GOWN DISPOSABLE) ×2
KIT BASIN OR (CUSTOM PROCEDURE TRAY) ×2 IMPLANT
MARKER SKIN DUAL TIP RULER LAB (MISCELLANEOUS) ×1 IMPLANT
NDL HYPO 25X1 1.5 SAFETY (NEEDLE) ×1 IMPLANT
NEEDLE HYPO 22GX1.5 SAFETY (NEEDLE) ×1 IMPLANT
NEEDLE HYPO 25X1 1.5 SAFETY (NEEDLE) IMPLANT
PACK BASIC VI WITH GOWN DISP (CUSTOM PROCEDURE TRAY) ×2 IMPLANT
PENCIL BUTTON HOLSTER BLD 10FT (ELECTRODE) ×2 IMPLANT
STRIP CLOSURE SKIN 1/2X4 (GAUZE/BANDAGES/DRESSINGS) ×1 IMPLANT
SUT MNCRL AB 4-0 PS2 18 (SUTURE) ×2 IMPLANT
SUT VIC AB 3-0 SH 27 (SUTURE) ×2
SUT VIC AB 3-0 SH 27X BRD (SUTURE) IMPLANT
SYR CONTROL 10ML LL (SYRINGE) ×2 IMPLANT
TOWEL OR 17X26 10 PK STRL BLUE (TOWEL DISPOSABLE) ×2 IMPLANT

## 2014-04-16 NOTE — Anesthesia Preprocedure Evaluation (Signed)
Anesthesia Evaluation  Patient identified by MRN, date of birth, ID band Patient awake    Reviewed: Allergy & Precautions, H&P , NPO status , Patient's Chart, lab work & pertinent test results  Airway Mallampati: II TM Distance: >3 FB Neck ROM: full    Dental  (+) Caps, Dental Advisory Given Two upper front are capped:   Pulmonary neg pulmonary ROS,  breath sounds clear to auscultation  Pulmonary exam normal       Cardiovascular Exercise Tolerance: Good hypertension, Pt. on medications Rhythm:regular Rate:Normal     Neuro/Psych TIACVA, No Residual Symptoms negative psych ROS   GI/Hepatic negative GI ROS, Neg liver ROS,   Endo/Other  negative endocrine ROS  Renal/GU negative Renal ROS  negative genitourinary   Musculoskeletal   Abdominal   Peds  Hematology negative hematology ROS (+) anemia , hgb 9.7   Anesthesia Other Findings   Reproductive/Obstetrics negative OB ROS                           Anesthesia Physical Anesthesia Plan  ASA: III  Anesthesia Plan: General   Post-op Pain Management:    Induction: Intravenous  Airway Management Planned: LMA  Additional Equipment:   Intra-op Plan:   Post-operative Plan:   Informed Consent: I have reviewed the patients History and Physical, chart, labs and discussed the procedure including the risks, benefits and alternatives for the proposed anesthesia with the patient or authorized representative who has indicated his/her understanding and acceptance.   Dental Advisory Given  Plan Discussed with: CRNA and Surgeon  Anesthesia Plan Comments:         Anesthesia Quick Evaluation

## 2014-04-16 NOTE — Op Note (Signed)
  PRE-OPERATIVE DIAGNOSIS:  un-needed Port-A-Cath for left breast cancer  POST-OPERATIVE DIAGNOSIS:  Same   PROCEDURE:  Procedure(s):  REMOVAL left subclavian PORT-A-CATH  SURGEON:  Surgeon(s):  Stark Klein, MD  ANESTHESIA:   MAC + local  EBL:   Minimal  SPECIMEN:  None  Complications : none known  Procedure:   Pt was  identified in the holding area and taken to the operating room where she was placed supine on the operating room table.  MAC anesthesia was induced. The R upper chest was prepped and draped.  The prior incision was anesthetized with local anesthetic.  The incision was opened with a #15 blade.  The subcutaneous tissue was divided with the cautery.  The port was identified and the capsule opened.  The 4 2-0 prolene sutures were removed.  The port was then removed and pressure held on the tract.  The catheter appeared intact without evidence of breakage.  The wound was inspected for hemostasis, which was achieved with cautery.  The wound was closed with 3-0 vicryl deep dermal interrupted sutures and 4-0 Monocryl running subcuticular suture.  The wound was cleaned, dried, and dressed with dermabond.  The patient was awakened from anesthesia and taken to the PACU in stable condition.  Needle, sponge, and instrument counts are correct.

## 2014-04-16 NOTE — Transfer of Care (Signed)
Immediate Anesthesia Transfer of Care Note  Patient: Kristi [name]  Procedure(s) Performed: Procedure(s): REMOVAL PORT-A-CATH (N/A)  Patient Location: PACU  Anesthesia Type:MAC  Level of Consciousness: awake, alert , oriented and sedated  Airway & Oxygen Therapy: Patient Spontanous Breathing and Patient connected to face mask oxygen  Post-op Assessment: Report given to PACU RN and Post -op Vital signs reviewed and stable  Post vital signs: Reviewed and stable  Complications: No apparent anesthesia complications

## 2014-04-16 NOTE — H&P (View-Only) (Signed)
HISTORY: Patient is a 59 year old female approximately 13 months status post left lumpectomy and axillary lymph node dissection for a multifocal left breast cancer. Pt underwent rotator cuff surgery and has been having physical therapy.  She is still a bit tearful, but much improved that last few visits.  She has not found any new breast masses.  She got her 1 year mammo in April of this year, and this was negative.     PERTINENT REVIEW OF SYSTEMS: Otherwise negative.    Filed Vitals:   03/24/14 1434  BP: 118/76  Pulse: 83  Temp: 97.8 F (36.6 C)   Filed Weights   03/24/14 1434  Weight: 145 lb (65.772 kg)    EXAM: Head: Normocephalic and atraumatic.  Eyes:  Conjunctivae are normal. Pupils are equal, round, and reactive to light. No scleral icterus.  Neck:  Normal range of motion. Neck supple. No tracheal deviation present. No thyromegaly present.  Resp: No respiratory distress, normal effort. Breast:  Left breast has some hyperpigmentation and skin edema.   No masses palpable.  Contracture of left axilla has improved, but is still present. She has tenderness of breast and superior shoulder.     Abd:  Abdomen is soft, non distended and non tender. No masses are palpable.  There is no rebound and no guarding.  Neurological: Alert and oriented to person, place, and time. Coordination normal.  Skin: Skin is warm and dry. No rash noted. No diaphoretic. No erythema. No pallor.  Psychiatric: Normal mood and affect. Normal behavior. Judgment and thought content normal.      ASSESSMENT AND PLAN:   Cancer of upper-inner quadrant of female breast Pt ready for port removal.  No clinical evidence of disease.    Discussed risks and benefits.        Milus Height, MD Surgical Oncology, [facility]  Ron Parker, MD Ron Parker, MD

## 2014-04-16 NOTE — Anesthesia Postprocedure Evaluation (Signed)
  Anesthesia Post-op Note  Patient: Kristi [name]  Procedure(s) Performed: Procedure(s) (LRB): REMOVAL PORT-A-CATH (N/A)  Patient Location: PACU  Anesthesia Type: MAC  Level of Consciousness: awake and alert   Airway and Oxygen Therapy: Patient Spontanous Breathing  Post-op Pain: mild  Post-op Assessment: Post-op Vital signs reviewed, Patient's Cardiovascular Status Stable, Respiratory Function Stable, Patent Airway and No signs of Nausea or vomiting  Last Vitals:  Filed Vitals:   04/16/14 1008  BP:   Pulse: 79  Temp:   Resp: 12    Post-op Vital Signs: stable   Complications: No apparent anesthesia complications

## 2014-04-16 NOTE — Discharge Instructions (Signed)
[facility] °Office Phone Number [phone] ° ° POST OP INSTRUCTIONS ° °Always review your discharge instruction sheet given to you by the facility where your surgery was performed. ° °IF YOU HAVE DISABILITY OR FAMILY LEAVE FORMS, YOU MUST BRING THEM TO THE OFFICE FOR PROCESSING.  DO NOT GIVE THEM TO YOUR DOCTOR. ° °1. A prescription for pain medication may be given to you upon discharge.  Take your pain medication as prescribed, if needed.  If narcotic pain medicine is not needed, then you may take acetaminophen (Tylenol) or ibuprofen (Advil) as needed. °2. Take your usually prescribed medications unless otherwise directed °3. If you need a refill on your pain medication, please contact your pharmacy.  They will contact our office to request authorization.  Prescriptions will not be filled after 5pm or on week-ends. °4. You should eat very light the first 24 hours after surgery, such as soup, crackers, pudding, etc.  Resume your normal diet the day after surgery °5. It is common to experience some constipation if taking pain medication after surgery.  Increasing fluid intake and taking a stool softener will usually help or prevent this problem from occurring.  A mild laxative (Milk of Magnesia or Miralax) should be taken according to package directions if there are no bowel movements after 48 hours. °6. You may shower in 48 hours.  The surgical glue will flake off in 2-3 weeks.   °7. ACTIVITIES:  No strenuous activity or heavy lifting for 1 week.   °a. You may drive when you no longer are taking prescription pain medication, you can comfortably wear a seatbelt, and you can safely maneuver your car and apply brakes. °b. RETURN TO WORK:  __________to be determined._______________ °You should see your doctor in the office for a follow-up appointment approximately three-four weeks after your surgery.   ° °WHEN TO CALL YOUR DOCTOR: °1. Fever over 101.0 °2. Nausea and/or vomiting. °3. Extreme swelling  or bruising. °4. Continued bleeding from incision. °5. Increased pain, redness, or drainage from the incision. ° °The clinic staff is available to answer your questions during regular business hours.  Please don’t hesitate to call and ask to speak to one of the nurses for clinical concerns.  If you have a medical emergency, go to the nearest emergency room or call 911.  A surgeon from [facility] is always on call at the hospital. ° °For further questions, please visit [email or web address]  ° °

## 2014-04-16 NOTE — Interval H&P Note (Signed)
History and Physical Interval Note:  04/16/2014 9:01 AM  Kristi [name]  has presented today for surgery, with the diagnosis of left breast cancer   The various methods of treatment have been discussed with the patient and family. After consideration of risks, benefits and other options for treatment, the patient has consented to  Procedure(s): REMOVAL PORT-A-CATH (N/A) as a surgical intervention .  The patient's history has been reviewed, patient examined, no change in status, stable for surgery.  I have reviewed the patient's chart and labs.  Questions were answered to the patient's satisfaction.     Kristi [name]

## 2014-04-20 ENCOUNTER — Telehealth: Payer: Self-pay | Admitting: Neurology

## 2014-04-20 NOTE — Telephone Encounter (Signed)
Patient's daughter calling to state that patient has swelling in the feet and sleepiness since starting the new medication, wants to know if this is normal. Please call and advise.

## 2014-04-21 ENCOUNTER — Ambulatory Visit: Payer: BC Managed Care – PPO | Admitting: Rehabilitation

## 2014-04-21 ENCOUNTER — Encounter (HOSPITAL_COMMUNITY): Payer: Self-pay | Admitting: General Surgery

## 2014-04-21 DIAGNOSIS — IMO0001 Reserved for inherently not codable concepts without codable children: Secondary | ICD-10-CM | POA: Diagnosis not present

## 2014-04-21 NOTE — Telephone Encounter (Signed)
Patient returning Ammie's call regarding this matter, please return call to patient and advise.

## 2014-04-22 ENCOUNTER — Other Ambulatory Visit: Payer: Self-pay | Admitting: Neurology

## 2014-04-22 DIAGNOSIS — G8929 Other chronic pain: Secondary | ICD-10-CM

## 2014-04-22 NOTE — Telephone Encounter (Signed)
Called pt and pt stated that she started taking the medication Tegretol 100 mg twice daily, per Dr. Hazle Quant OV note on 04/15/14. Pt stated that she had not started taking medication yet because pt has surgery on 04/16/14 and pt stated she started taking medication on 04/17/14 and couple of days later, pt stated she noticed her feet swelling and she stopped taking the medication. I advised the pt start back taking the medication, because it had not gotten into her system good and if the swelling continues to give our office a call. FYI

## 2014-04-22 NOTE — Telephone Encounter (Signed)
Called pt and left message for pt to call back.

## 2014-04-22 NOTE — Telephone Encounter (Signed)
Please let her know to hold off on tegretol for now. I would like her to follow up with a pain clinic for further treatment options as she is on multiple medications at this time with no benefit. She will be called to schedule this.

## 2014-04-22 NOTE — Telephone Encounter (Signed)
Called pt and informed her per Dr. Janann Colonel to stop taking the tegretol for now and would like for the pt to f/u with a pain clinic for further treatment options because pt is taking multiple medications with no benefits. I informed pt that someone will be calling her to set up an appt. I advised the pt that if she has any other problems, questions or concerns to call the office. Pt verbalized understanding. FYI

## 2014-04-24 ENCOUNTER — Ambulatory Visit: Payer: BC Managed Care – PPO | Admitting: Rehabilitation

## 2014-04-24 DIAGNOSIS — IMO0001 Reserved for inherently not codable concepts without codable children: Secondary | ICD-10-CM | POA: Diagnosis not present

## 2014-04-28 ENCOUNTER — Ambulatory Visit: Payer: BC Managed Care – PPO | Admitting: Rehabilitation

## 2014-04-28 DIAGNOSIS — IMO0001 Reserved for inherently not codable concepts without codable children: Secondary | ICD-10-CM | POA: Diagnosis not present

## 2014-05-01 ENCOUNTER — Ambulatory Visit: Payer: BC Managed Care – PPO | Admitting: Rehabilitation

## 2014-05-02 NOTE — Addendum Note (Signed)
Addendum created 05/02/14 8088 by Peyton Najjar, MD   Modules edited: Anesthesia Attestations

## 2014-05-04 ENCOUNTER — Telehealth: Payer: Self-pay | Admitting: Neurology

## 2014-05-04 NOTE — Telephone Encounter (Signed)
Patient called to state that she has shingles on her hip and wants to know if this will interfere with her testing. Please call to advise if she needs to reschedule or keep her appointment.

## 2014-05-04 NOTE — Telephone Encounter (Signed)
Monica discussed with Towana Badger and okayed patient to be seen for [place]/EMG.

## 2014-05-05 ENCOUNTER — Ambulatory Visit: Payer: BC Managed Care – PPO | Admitting: Rehabilitation

## 2014-05-06 ENCOUNTER — Ambulatory Visit (INDEPENDENT_AMBULATORY_CARE_PROVIDER_SITE_OTHER): Payer: BC Managed Care – PPO | Admitting: Neurology

## 2014-05-06 ENCOUNTER — Encounter (INDEPENDENT_AMBULATORY_CARE_PROVIDER_SITE_OTHER): Payer: Self-pay

## 2014-05-06 DIAGNOSIS — G609 Hereditary and idiopathic neuropathy, unspecified: Secondary | ICD-10-CM

## 2014-05-06 DIAGNOSIS — Z0289 Encounter for other administrative examinations: Secondary | ICD-10-CM

## 2014-05-06 DIAGNOSIS — R209 Unspecified disturbances of skin sensation: Secondary | ICD-10-CM

## 2014-05-06 NOTE — Procedures (Signed)
     HISTORY:  Kristi [name] is a 58 year old patient with a history of breast cancer, status post chemotherapy treatment with Adriamycin and Cytoxan. She was on Taxol briefly but developed an allergy to this. She developed some tingling and discomfort in the feet, particularly in the distal portions of the feet in August of 2014 with chemotherapy. The symptoms have persisted. She is being evaluated for possible neuropathy.  NERVE CONDUCTION STUDIES:  Nerve conduction studies were performed on both lower extremities. The distal motor latencies and motor amplitudes for the peroneal and posterior tibial nerves were within normal limits. The nerve conduction velocities for these nerves were also normal. The H reflex latencies were normal. The sensory latencies for the peroneal nerves were within normal limits. The medial and lateral plantar sensory latencies are symmetric and normal bilaterally.   EMG STUDIES:  EMG study was performed on the right lower extremity:  The tibialis anterior muscle reveals 2 to 4K motor units with full recruitment. No fibrillations or positive waves were seen. The peroneus tertius muscle reveals 2 to 4K motor units with full recruitment. No fibrillations or positive waves were seen. The medial gastrocnemius muscle reveals 1 to 3K motor units with full recruitment. No fibrillations or positive waves were seen. The vastus lateralis muscle reveals 2 to 4K motor units with full recruitment. No fibrillations or positive waves were seen. The iliopsoas muscle reveals 2 to 4K motor units with full recruitment. No fibrillations or positive waves were seen. The biceps femoris muscle (long head) reveals 2 to 4K motor units with full recruitment. No fibrillations or positive waves were seen. The lumbosacral paraspinal muscles were tested at 3 levels, and revealed no abnormalities of insertional activity at all 3 levels tested. There was good relaxation.  EMG study was performed  on the left lower extremity:  The tibialis anterior muscle reveals 2 to 4K motor units with full recruitment. No fibrillations or positive waves were seen. The peroneus tertius muscle reveals 2 to 4K motor units with full recruitment. No fibrillations or positive waves were seen. The medial gastrocnemius muscle reveals 1 to 3K motor units with full recruitment. No fibrillations or positive waves were seen. The vastus lateralis muscle reveals 2 to 4K motor units with full recruitment. No fibrillations or positive waves were seen. The iliopsoas muscle reveals 2 to 4K motor units with full recruitment. No fibrillations or positive waves were seen. The biceps femoris muscle (long head) reveals 2 to 4K motor units with full recruitment. No fibrillations or positive waves were seen. The lumbosacral paraspinal muscles were tested at 3 levels, and revealed no abnormalities of insertional activity at all 3 levels tested. There was good relaxation.   IMPRESSION:  Nerve conduction studies done on both lower extremities were within normal limits bilaterally. No evidence of a peripheral neuropathy is seen. A small fiber neuropathy may be missed by a standard nerve conduction studies, however. Clinical correlation is required. EMG evaluation of both lower extremities was unremarkable, without evidence of an overlying lumbosacral radiculopathy on either side.  Jill Alexanders MD 05/06/2014 12:27 PM  [facility] [address] [place],  [address]  Phone [phone] Fax [phone]

## 2014-05-07 ENCOUNTER — Ambulatory Visit: Payer: BC Managed Care – PPO | Admitting: Rehabilitation

## 2014-05-07 ENCOUNTER — Telehealth: Payer: Self-pay | Admitting: *Deleted

## 2014-05-07 NOTE — Telephone Encounter (Signed)
Message copied by Vivi Barrack on Thu May 07, 2014  9:45 AM ------      Message from: Drema Dallas      Created: Wed May 06, 2014  5:28 PM       Please let her know her EMG/NCS was normal. Thanks. ------

## 2014-05-07 NOTE — Telephone Encounter (Signed)
Left patient message that her studies were normal.

## 2014-05-11 ENCOUNTER — Telehealth: Payer: Self-pay

## 2014-05-11 NOTE — Telephone Encounter (Signed)
Received fax from [facility] dtd 05/06/14 Dr Ace Gins, progress note.  Copy to [place]. Original to scan.

## 2014-05-12 ENCOUNTER — Ambulatory Visit: Payer: BC Managed Care – PPO | Admitting: Rehabilitation

## 2014-05-14 ENCOUNTER — Telehealth: Payer: Self-pay | Admitting: *Deleted

## 2014-05-14 ENCOUNTER — Telehealth: Payer: Self-pay | Admitting: Hematology and Oncology

## 2014-05-14 ENCOUNTER — Ambulatory Visit (HOSPITAL_BASED_OUTPATIENT_CLINIC_OR_DEPARTMENT_OTHER): Payer: BC Managed Care – PPO | Admitting: Nurse Practitioner

## 2014-05-14 ENCOUNTER — Emergency Department (HOSPITAL_COMMUNITY)
Admission: EM | Admit: 2014-05-14 | Discharge: 2014-05-15 | Disposition: A | Discharge status: Home / Self Care | Payer: BC Managed Care – PPO | Attending: Emergency Medicine | Admitting: Emergency Medicine

## 2014-05-14 ENCOUNTER — Encounter (HOSPITAL_COMMUNITY): Payer: Self-pay | Admitting: Emergency Medicine

## 2014-05-14 VITALS — BP 132/77 | HR 72 | Temp 98.4°F | Resp 18 | Ht 62.0 in | Wt 154.3 lb

## 2014-05-14 DIAGNOSIS — M25519 Pain in unspecified shoulder: Secondary | ICD-10-CM

## 2014-05-14 DIAGNOSIS — F3289 Other specified depressive episodes: Secondary | ICD-10-CM | POA: Insufficient documentation

## 2014-05-14 DIAGNOSIS — F419 Anxiety disorder, unspecified: Secondary | ICD-10-CM

## 2014-05-14 DIAGNOSIS — F411 Generalized anxiety disorder: Secondary | ICD-10-CM | POA: Insufficient documentation

## 2014-05-14 DIAGNOSIS — B029 Zoster without complications: Secondary | ICD-10-CM

## 2014-05-14 DIAGNOSIS — F32A Depression, unspecified: Secondary | ICD-10-CM

## 2014-05-14 DIAGNOSIS — Z8669 Personal history of other diseases of the nervous system and sense organs: Secondary | ICD-10-CM | POA: Insufficient documentation

## 2014-05-14 DIAGNOSIS — I10 Essential (primary) hypertension: Secondary | ICD-10-CM | POA: Insufficient documentation

## 2014-05-14 DIAGNOSIS — F329 Major depressive disorder, single episode, unspecified: Secondary | ICD-10-CM

## 2014-05-14 DIAGNOSIS — M25512 Pain in left shoulder: Secondary | ICD-10-CM

## 2014-05-14 DIAGNOSIS — C50219 Malignant neoplasm of upper-inner quadrant of unspecified female breast: Secondary | ICD-10-CM

## 2014-05-14 DIAGNOSIS — N61 Mastitis without abscess: Secondary | ICD-10-CM

## 2014-05-14 DIAGNOSIS — Z8614 Personal history of Methicillin resistant Staphylococcus aureus infection: Secondary | ICD-10-CM | POA: Insufficient documentation

## 2014-05-14 DIAGNOSIS — Z853 Personal history of malignant neoplasm of breast: Secondary | ICD-10-CM | POA: Insufficient documentation

## 2014-05-14 DIAGNOSIS — Z7982 Long term (current) use of aspirin: Secondary | ICD-10-CM | POA: Insufficient documentation

## 2014-05-14 DIAGNOSIS — Z862 Personal history of diseases of the blood and blood-forming organs and certain disorders involving the immune mechanism: Secondary | ICD-10-CM | POA: Insufficient documentation

## 2014-05-14 DIAGNOSIS — G609 Hereditary and idiopathic neuropathy, unspecified: Secondary | ICD-10-CM

## 2014-05-14 DIAGNOSIS — M129 Arthropathy, unspecified: Secondary | ICD-10-CM | POA: Insufficient documentation

## 2014-05-14 DIAGNOSIS — Z923 Personal history of irradiation: Secondary | ICD-10-CM | POA: Insufficient documentation

## 2014-05-14 DIAGNOSIS — Z79899 Other long term (current) drug therapy: Secondary | ICD-10-CM | POA: Insufficient documentation

## 2014-05-14 DIAGNOSIS — N644 Mastodynia: Secondary | ICD-10-CM

## 2014-05-14 DIAGNOSIS — C50212 Malignant neoplasm of upper-inner quadrant of left female breast: Secondary | ICD-10-CM

## 2014-05-14 LAB — CBC WITH DIFFERENTIAL/PLATELET
BASOS ABS: 0 10*3/uL (ref 0.0–0.1)
Basophils Relative: 0 % (ref 0–1)
EOS PCT: 2 % (ref 0–5)
Eosinophils Absolute: 0.1 10*3/uL (ref 0.0–0.7)
HCT: 33.4 % — ABNORMAL LOW (ref 36.0–46.0)
Hemoglobin: 11.3 g/dL — ABNORMAL LOW (ref 12.0–15.0)
Lymphocytes Relative: 43 % (ref 12–46)
Lymphs Abs: 1.5 10*3/uL (ref 0.7–4.0)
MCH: 28.9 pg (ref 26.0–34.0)
MCHC: 33.8 g/dL (ref 30.0–36.0)
MCV: 85.4 fL (ref 78.0–100.0)
Monocytes Absolute: 0.2 10*3/uL (ref 0.1–1.0)
Monocytes Relative: 6 % (ref 3–12)
Neutro Abs: 1.7 10*3/uL (ref 1.7–7.7)
Neutrophils Relative %: 49 % (ref 43–77)
Platelets: 135 10*3/uL — ABNORMAL LOW (ref 150–400)
RBC: 3.91 MIL/uL (ref 3.87–5.11)
RDW: 14.8 % (ref 11.5–15.5)
WBC: 3.4 10*3/uL — ABNORMAL LOW (ref 4.0–10.5)

## 2014-05-14 LAB — COMPREHENSIVE METABOLIC PANEL
ALBUMIN: 3.5 g/dL (ref 3.5–5.2)
ALT: 16 U/L (ref 0–35)
AST: 22 U/L (ref 0–37)
Alkaline Phosphatase: 69 U/L (ref 39–117)
Anion gap: 10 (ref 5–15)
BUN: 12 mg/dL (ref 6–23)
CALCIUM: 9.7 mg/dL (ref 8.4–10.5)
CO2: 30 mEq/L (ref 19–32)
CREATININE: 0.78 mg/dL (ref 0.50–1.10)
Chloride: 101 mEq/L (ref 96–112)
GFR calc Af Amer: >90 mL/min (ref >90)
GFR, EST NON AFRICAN AMERICAN: 90 mL/min — AB (ref >90)
Glucose, Bld: 117 mg/dL — ABNORMAL HIGH (ref 70–99)
Potassium: 3.6 mEq/L — ABNORMAL LOW (ref 3.7–5.3)
Sodium: 141 mEq/L (ref 137–147)
Total Bilirubin: 0.2 mg/dL — ABNORMAL LOW (ref 0.3–1.2)
Total Protein: 7.1 g/dL (ref 6.0–8.3)

## 2014-05-14 LAB — RAPID URINE DRUG SCREEN, HOSP PERFORMED
AMPHETAMINES: NOT DETECTED
Barbiturates: NOT DETECTED
Benzodiazepines: POSITIVE — AB
COCAINE: NOT DETECTED
OPIATES: NOT DETECTED
Tetrahydrocannabinol: NOT DETECTED

## 2014-05-14 LAB — ETHANOL: Alcohol, Ethyl (B): <11 mg/dL (ref 0–11)

## 2014-05-14 MED ORDER — HYDROCHLOROTHIAZIDE 25 MG PO TABS
25.0000 mg | ORAL_TABLET | Freq: Every day | ORAL | Status: DC
Start: 2014-05-15 — End: 2014-05-15
  Filled 2014-05-14: qty 1

## 2014-05-14 MED ORDER — ASPIRIN EC 81 MG PO TBEC
162.0000 mg | DELAYED_RELEASE_TABLET | Freq: Every day | ORAL | Status: DC
  Filled 2014-05-14: qty 2

## 2014-05-14 MED ORDER — OXYCODONE HCL 5 MG PO TABS: 5.0000 mg | ORAL_TABLET | ORAL | Status: DC | PRN

## 2014-05-14 MED ORDER — METHOCARBAMOL 500 MG PO TABS: 500.0000 mg | ORAL_TABLET | Freq: Four times a day (QID) | ORAL | Status: DC | PRN

## 2014-05-14 MED ORDER — TAMOXIFEN CITRATE 10 MG PO TABS
20.0000 mg | ORAL_TABLET | Freq: Every day | ORAL | Status: DC
  Filled 2014-05-14: qty 2

## 2014-05-14 MED ORDER — IRBESARTAN 300 MG PO TABS
300.0000 mg | ORAL_TABLET | Freq: Every day | ORAL | Status: DC
  Filled 2014-05-14: qty 1

## 2014-05-14 MED ORDER — DULOXETINE HCL 60 MG PO CPEP
60.0000 mg | ORAL_CAPSULE | Freq: Two times a day (BID) | ORAL | Status: DC
  Filled 2014-05-14 (×3): qty 1

## 2014-05-14 MED ORDER — ADULT MULTIVITAMIN W/MINERALS CH
1.0000 | ORAL_TABLET | Freq: Every day | ORAL | Status: DC
  Filled 2014-05-14: qty 1

## 2014-05-14 MED ORDER — VALSARTAN-HYDROCHLOROTHIAZIDE 320-25 MG PO TABS: 1.0000 | ORAL_TABLET | Freq: Every morning | ORAL | Status: DC

## 2014-05-14 MED ORDER — GABAPENTIN 400 MG PO CAPS
800.0000 mg | ORAL_CAPSULE | Freq: Three times a day (TID) | ORAL | Status: DC
  Filled 2014-05-14 (×4): qty 2

## 2014-05-14 MED ORDER — CLONAZEPAM 0.5 MG PO TABS: 2.0000 mg | ORAL_TABLET | Freq: Every evening | ORAL | Status: DC | PRN

## 2014-05-14 MED ORDER — ALPRAZOLAM 0.5 MG PO TABS
1.0000 mg | ORAL_TABLET | Freq: Three times a day (TID) | ORAL | Status: DC
  Administered 2014-05-14: 2 mg via ORAL
  Filled 2014-05-14: qty 4

## 2014-05-14 MED ORDER — CARBAMAZEPINE 200 MG PO TABS
100.0000 mg | ORAL_TABLET | Freq: Two times a day (BID) | ORAL | Status: DC
  Filled 2014-05-14 (×3): qty 0.5

## 2014-05-14 NOTE — Telephone Encounter (Signed)
   Provider input needed: LEFT BREAST PROBLEM   Reason for call: BREAST FEELS "HEAVY" AND IS SWOLLEN.  Integument/breast: positive for breast tenderness, skin color change and swelling.   ALLERGIES:  has No Known Allergies.  Patient last received chemotherapy/ treatment on n/a  Patient was last seen in the office on 02/20/14  Next appt is 09/01/14  Is patient having fevers greater than 100.5?  no, TEMPERATURE IS 97.7. PT. HAS BEEN HAVING CHILLS AND SWEATS FOR TWO WEEKS.   Is patient having uncontrolled pain, or new pain? yes, "soreness"   Is patient having new back pain that changes with position (worsens or eases when laying down?)  no   Is patient able to eat and drink?n/a     Is patient able to pass stool without difficulty?   n/a     Is patient having uncontrolled nausea?  n/a    patient calls 05/14/2014 with complaint of  Integument/breast: positive for breast tenderness, skin color change and swelling.   Summary Based on the above information advised patient to  Come to the [facility] today at 2:30pm.    Kristi [name]  05/14/2014, 2:10 PM   Background Info  [name]   DOB: November 07, 1955   MR#: [id number]   CSN#   [id number] 05/14/2014

## 2014-05-14 NOTE — ED Provider Notes (Signed)
CSN: [id number]     Arrival date & time 05/14/14  1716 History   First MD Initiated Contact with Patient 05/14/14 1821     Chief Complaint  Patient presents with  . Depression  . Anxiety  . possible infection of breast      (Consider location/radiation/quality/duration/timing/severity/associated sxs/prior Treatment) HPI Comments: Patient presents to the emergency department with chief complaint of depression and anxiety. She states that over the past few weeks, her symptoms have been worsening. She denies any SI or HI. She has a history of breast cancer, as well as chronic pain. She states that she has felt very sad lately. She was seen by the breast Center today, and evaluated for an erythematous left breast. She was referred to the emergency department for evaluation of depression as well as the breast. She states that she has had firmness and erythema to her left breast for the past couple of days. It is painful to palpation. She denies any fevers or chills. She has not tried taking anything to alleviate her symptoms. She is not currently undergoing any chemotherapy or radiation therapy.  The history is provided by the patient. No language interpreter was used.    Past Medical History  Diagnosis Date  . Anxiety   . Hypertension   . MRSA (methicillin resistant Staphylococcus aureus) 2009    right groin area-no issues now. 04-07-14 PCR screen negative today.  . Depression   . Anemia     Iron deficinecy anemia  . History of radiation therapy 09/09/13-10/28/13    45 gray to left breast, lumpectomy cavity boosted to 63 gray  . Neuropathy   . Arthritis   . Breast cancer     left ,last radiation 2'15, last chemo 8'14   Past Surgical History  Procedure Laterality Date  . Anal sphincterotomy  04/2011  . Hemorrhoid surgery  04/2011    ligation  . Breast lumpectomy with needle localization Left 02/04/2013    Procedure: LEFT BREAST LUMPECTOMY WITH NEEDLE LOCALIZATION;  Surgeon: Stark Klein,  MD;  Location: [facility];  Service: General;  Laterality: Left;  . Axillary lymph node dissection Left 02/04/2013    Procedure: LEFT AXILLARY LYMPH NODE DISSECTION;  Surgeon: Stark Klein, MD;  Location: [facility];  Service: General;  Laterality: Left;  End: 7893  . Portacath placement Right 02/04/2013    Procedure: INSERTION PORT-A-CATH;  Surgeon: Stark Klein, MD;  Location: [facility];  Service: General;  Laterality: Right;  Start Time: 8101.  Marland Kitchen Appendectomy  1980  . Breast surgery      Lumpectomy in april 2014  . Shoulder arthroscopy with rotator cuff repair and subacromial decompression Left 02/24/2014    Procedure: SHOULDER ARTHROSCOPY WITH ROTATOR CUFF REPAIR AND SUBACROMIAL DECOMPRESSION;  Surgeon: Meredith Pel, MD;  Location: [facility];  Service: Orthopedics;  Laterality: Left;  LEFT SHOULDER DIAGNOSTIC OPERATIVE ARTHROSCOPY, SUBACROMIAL DECOMPRESSION, ROTATOR CUFF TEAR REPAIR  . Port-a-cath removal N/A 04/16/2014    Procedure: REMOVAL PORT-A-CATH;  Surgeon: Stark Klein, MD;  Location: [facility];  Service: General;  Laterality: N/A;   Family History  Problem Relation Age of Onset  . Lung cancer Father   . Hypertension Father   . Thyroid cancer Father     dx in his 95s  . Breast cancer Paternal Aunt 84  . Colon cancer Paternal Aunt     dx in her 50x  . Cervical cancer Paternal Aunt     dzx in her 61s  . Ovarian cancer Cousin  dx in her lage 45s  . Breast cancer Cousin     maternal first cousin, once removed; dx in her late 59s  . Breast cancer Cousin     maternal first cousin once removed; dx in late 23s  . Hypertension Mother   . Diabetes Mother   . Dementia Mother   . Hypertension Brother   . Seizures Brother     Alcohol induced.  . Cancer Paternal Uncle     oral cancer  . Kidney cancer Paternal Grandmother   . Cancer Cousin     several paternal cousins with brain cancer, leukemia, and other cancers   History  Substance Use Topics  . Smoking status: Never Smoker   . Smokeless  tobacco: Never Used  . Alcohol Use: No   OB History   Grav Para Term Preterm Abortions TAB SAB Ect Mult Living                 Review of Systems  All other systems reviewed and are negative.     Allergies  Review of patient's allergies indicates no known allergies.  Home Medications   Prior to Admission medications   Medication Sig Start Date End Date Taking? Authorizing Provider  ALPRAZolam Duanne Moron) 1 MG tablet Take 1-2 mg by mouth 3 (three) times daily. 1 tablet twice daily then 2 tablets at bedtime    Historical Provider, MD  aspirin EC 81 MG tablet Take 162 mg by mouth daily.    Historical Provider, MD  B Complex-C (SUPER B COMPLEX PO) Take 1 tablet by mouth daily.    Historical Provider, MD  carbamazepine (TEGRETOL) 200 MG tablet Take 0.5 tablets (100 mg total) by mouth 2 (two) times daily. 04/15/14   Hulen Luster, DO  clonazePAM (KLONOPIN) 2 MG tablet Take 2 mg by mouth at bedtime as needed (Sleep).    Historical Provider, MD  DULoxetine (CYMBALTA) 60 MG capsule Take 60 mg by mouth 2 (two) times daily.    Historical Provider, MD  gabapentin (NEURONTIN) 800 MG tablet Take 1 tablet (800 mg total) by mouth 3 (three) times daily. 01/15/14   Hulen Luster, DO  methocarbamol (ROBAXIN) 500 MG tablet Take 1 tablet (500 mg total) by mouth every 6 (six) hours as needed for muscle spasms. 02/26/14   Meredith Pel, MD  Multiple Vitamin (MULTIVITAMIN WITH MINERALS) TABS tablet Take 1 tablet by mouth daily.    Historical Provider, MD  oxyCODONE (OXY IR/ROXICODONE) 5 MG immediate release tablet Take 1-2 tablets (5-10 mg total) by mouth every 3 (three) hours as needed for breakthrough pain. 04/16/14   Stark Klein, MD  OxyCODONE (OXYCONTIN) 15 mg T12A 12 hr tablet Take 15 mg by mouth 2 (two) times daily as needed (Pain).    Historical Provider, MD  tamoxifen (NOLVADEX) 20 MG tablet Take 20 mg by mouth daily.    Historical Provider, MD  valsartan-hydrochlorothiazide (DIOVAN-HCT)  320-25 MG per tablet Take 1 tablet by mouth every morning.  01/07/13   Historical Provider, MD   BP 135/75  Pulse 51  Temp(Src) 97.7 F (36.5 C) (Oral)  Resp 18  SpO2 98%  LMP 01/20/2013 Physical Exam  Nursing note and vitals reviewed. Constitutional: She is oriented to person, place, and time. She appears well-developed and well-nourished.  HENT:  Head: Normocephalic and atraumatic.  Eyes: Conjunctivae and EOM are normal. Pupils are equal, round, and reactive to light.  Neck: Normal range of motion. Neck supple.  Cardiovascular: Normal rate and  regular rhythm.  Exam reveals no gallop and no friction rub.   No murmur heard. Pulmonary/Chest: Effort normal and breath sounds normal. No respiratory distress. She has no wheezes. She has no rales. She exhibits no tenderness.  Breast exam: Chaperone present Left breast is mildly erythematous around the areola, and tender to palpation on the lateral aspect, no evidence of abscess  Abdominal: Soft. Bowel sounds are normal. She exhibits no distension and no mass. There is no tenderness. There is no rebound and no guarding.  Musculoskeletal: Normal range of motion. She exhibits no edema and no tenderness.  Neurological: She is alert and oriented to person, place, and time.  Skin: Skin is warm and dry.  Psychiatric: She has a normal mood and affect. Her behavior is normal. Judgment and thought content normal.    ED Course  Procedures (including critical care time) Results for orders placed during the hospital encounter of 05/14/14  CBC WITH DIFFERENTIAL      Result Value Ref Range   WBC 3.4 (*) 4.0 - 10.5 K/uL   RBC 3.91  3.87 - 5.11 MIL/uL   Hemoglobin 11.3 (*) 12.0 - 15.0 g/dL   HCT 33.4 (*) 36.0 - 46.0 %   MCV 85.4  78.0 - 100.0 fL   MCH 28.9  26.0 - 34.0 pg   MCHC 33.8  30.0 - 36.0 g/dL   RDW 14.8  11.5 - 15.5 %   Platelets 135 (*) 150 - 400 K/uL   Neutrophils Relative % 49  43 - 77 %   Neutro Abs 1.7  1.7 - 7.7 K/uL   Lymphocytes  Relative 43  12 - 46 %   Lymphs Abs 1.5  0.7 - 4.0 K/uL   Monocytes Relative 6  3 - 12 %   Monocytes Absolute 0.2  0.1 - 1.0 K/uL   Eosinophils Relative 2  0 - 5 %   Eosinophils Absolute 0.1  0.0 - 0.7 K/uL   Basophils Relative 0  0 - 1 %   Basophils Absolute 0.0  0.0 - 0.1 K/uL  COMPREHENSIVE METABOLIC PANEL      Result Value Ref Range   Sodium 141  137 - 147 mEq/L   Potassium 3.6 (*) 3.7 - 5.3 mEq/L   Chloride 101  96 - 112 mEq/L   CO2 30  19 - 32 mEq/L   Glucose, Bld 117 (*) 70 - 99 mg/dL   BUN 12  6 - 23 mg/dL   Creatinine, Ser 0.78  0.50 - 1.10 mg/dL   Calcium 9.7  8.4 - 10.5 mg/dL   Total Protein 7.1  6.0 - 8.3 g/dL   Albumin 3.5  3.5 - 5.2 g/dL   AST 22  0 - 37 U/L   ALT 16  0 - 35 U/L   Alkaline Phosphatase 69  39 - 117 U/L   Total Bilirubin 0.2 (*) 0.3 - 1.2 mg/dL   GFR calc non Af Amer 90 (*) >90 mL/min   GFR calc Af Amer >90  >90 mL/min   Anion gap 10  5 - 15  URINE RAPID DRUG SCREEN (HOSP PERFORMED)      Result Value Ref Range   Opiates NONE DETECTED  NONE DETECTED   Cocaine NONE DETECTED  NONE DETECTED   Benzodiazepines POSITIVE (*) NONE DETECTED   Amphetamines NONE DETECTED  NONE DETECTED   Tetrahydrocannabinol NONE DETECTED  NONE DETECTED   Barbiturates NONE DETECTED  NONE DETECTED  ETHANOL      Result  Value Ref Range   Alcohol, Ethyl (B) <11  0 - 11 mg/dL     No results found.   EKG Interpretation None      MDM   Final diagnoses:  Mastitis  Depression  Anxiety    Patient with possible left-sided mastitis. No evidence of abscess. No induration. Will treat with Augmentin.  Also, patient states that she has been very depressed and anxious. She wishes to speak with a counselor. TTS consult is pending.    Montine Circle, PA-C 05/15/14 0045

## 2014-05-14 NOTE — ED Notes (Signed)
Pt reports feeling anxious and normally takes cymbalta and xananx.  Pt requesting medications to help with anxiety. Provider made aware.

## 2014-05-14 NOTE — ED Notes (Signed)
Patient states her depression and anxiety have increased over the past few weeks. Patient denies feeling suicidal/homicidal. Patient went to the [facility] today and also noted that the left chest area was slightly red and hard. Patient was told to come to the Ed for evaluation of the chest area and medical clearance for depression and anxiety.

## 2014-05-14 NOTE — Telephone Encounter (Signed)
, °

## 2014-05-15 ENCOUNTER — Ambulatory Visit: Payer: BC Managed Care – PPO | Admitting: Rehabilitation

## 2014-05-15 ENCOUNTER — Encounter: Payer: Self-pay | Admitting: Nurse Practitioner

## 2014-05-15 DIAGNOSIS — F329 Major depressive disorder, single episode, unspecified: Secondary | ICD-10-CM | POA: Insufficient documentation

## 2014-05-15 DIAGNOSIS — F32A Depression, unspecified: Secondary | ICD-10-CM | POA: Insufficient documentation

## 2014-05-15 DIAGNOSIS — N644 Mastodynia: Secondary | ICD-10-CM | POA: Insufficient documentation

## 2014-05-15 DIAGNOSIS — F419 Anxiety disorder, unspecified: Secondary | ICD-10-CM | POA: Insufficient documentation

## 2014-05-15 DIAGNOSIS — B029 Zoster without complications: Secondary | ICD-10-CM

## 2014-05-15 DIAGNOSIS — F411 Generalized anxiety disorder: Secondary | ICD-10-CM | POA: Insufficient documentation

## 2014-05-15 HISTORY — DX: Generalized anxiety disorder: F41.1

## 2014-05-15 HISTORY — DX: Zoster without complications: B02.9

## 2014-05-15 HISTORY — DX: Depression, unspecified: F32.A

## 2014-05-15 HISTORY — DX: Major depressive disorder, single episode, unspecified: F32.9

## 2014-05-15 HISTORY — DX: Anxiety disorder, unspecified: F41.9

## 2014-05-15 MED ORDER — AMOXICILLIN-POT CLAVULANATE 875-125 MG PO TABS: 1.0000 | ORAL_TABLET | Freq: Two times a day (BID) | ORAL | Status: DC

## 2014-05-15 MED ORDER — AMOXICILLIN-POT CLAVULANATE 875-125 MG PO TABS
1.0000 | ORAL_TABLET | Freq: Once | ORAL | Status: AC
  Administered 2014-05-15: 1 via ORAL
  Filled 2014-05-15: qty 1

## 2014-05-15 NOTE — Assessment & Plan Note (Signed)
Pt continues to deny either suicidal or homicidal ideation; and just saw her therapist 2 days ago. No changes have been made to her Cymbalta recently. She continues to take Xanax on PRN basis as well. She already has follow up appts with both her psychiatrist Dr. Sheppard Evens (06/04/2014) and her therapist Monique Crutchfield (05/20/14)- but feels that she still needs more emergent psych eval for her worsening depression and anxiety. Advised pt that she should go to ED for further psych eval this evening. Pt is in agreement with this plan; and will have her daughter (who is currently sitting with patient) take her to ED.

## 2014-05-15 NOTE — BH Assessment (Signed)
Assessment Note  Kristi [name] is an 58 y.o. female.  -Clinician reviewed note by Montine Circle, PA.  Patient is tearful and complains of increased depression and anxiety.  Patient said that over the past few weeks she has been more depressed and anxious.  She relates this to her health problems over the last few months.  She breast cancer in left breast.  She said that 8 weeks ago she had left rotator cuff surgery.  Also had shingles primarily on the left side up to 2 weeks ago and also has neuropathy in both feet.  Pt becomes tearful about her health issues.  She mentioned that she used to enjoy taking care of her 60 year old niece but she cannot do that anymore.  Patient does deny SI, HI or A/V hallucinations.  No previous history of inpatient psychiatric care.    Patient does see Dr. Sheppard Evens for psychiatry in [facility] and has seen him for the last three years.  Next appointment is 07/30.  She sees Monique Crutchfield in that same office for therapy and that next appointment is 07/15.   -Patient care was discussed with Serena Colonel, NP who recommended patient contact currrent provider for any medication changes.  Clinician also discussed this with Dr. Sharol Given who agreed and will discharge patient to care of husband to return home and follow up with Dr. Bess Harvest.  Axis I: Depressive Disorder NOS Axis II: Deferred Axis III:  Past Medical History  Diagnosis Date  . Anxiety   . Hypertension   . MRSA (methicillin resistant Staphylococcus aureus) 2009    right groin area-no issues now. 04-07-14 PCR screen negative today.  . Depression   . Anemia     Iron deficinecy anemia  . History of radiation therapy 09/09/13-10/28/13    45 gray to left breast, lumpectomy cavity boosted to 63 gray  . Neuropathy   . Arthritis   . Breast cancer     left ,last radiation 2'15, last chemo 8'14   Axis IV: other psychosocial or environmental problems Axis V: 61-70 mild symptoms  Past Medical History:   Past Medical History  Diagnosis Date  . Anxiety   . Hypertension   . MRSA (methicillin resistant Staphylococcus aureus) 2009    right groin area-no issues now. 04-07-14 PCR screen negative today.  . Depression   . Anemia     Iron deficinecy anemia  . History of radiation therapy 09/09/13-10/28/13    45 gray to left breast, lumpectomy cavity boosted to 63 gray  . Neuropathy   . Arthritis   . Breast cancer     left ,last radiation 2'15, last chemo 8'14    Past Surgical History  Procedure Laterality Date  . Anal sphincterotomy  04/2011  . Hemorrhoid surgery  04/2011    ligation  . Breast lumpectomy with needle localization Left 02/04/2013    Procedure: LEFT BREAST LUMPECTOMY WITH NEEDLE LOCALIZATION;  Surgeon: Stark Klein, MD;  Location: [place];  Service: General;  Laterality: Left;  . Axillary lymph node dissection Left 02/04/2013    Procedure: LEFT AXILLARY LYMPH NODE DISSECTION;  Surgeon: Stark Klein, MD;  Location: [place];  Service: General;  Laterality: Left;  End: 0814  . Portacath placement Right 02/04/2013    Procedure: INSERTION PORT-A-CATH;  Surgeon: Stark Klein, MD;  Location: [place];  Service: General;  Laterality: Right;  Start Time: 4818.  Marland Kitchen Appendectomy  1980  . Breast surgery      Lumpectomy in april  2014  . Shoulder arthroscopy with rotator cuff repair and subacromial decompression Left 02/24/2014    Procedure: SHOULDER ARTHROSCOPY WITH ROTATOR CUFF REPAIR AND SUBACROMIAL DECOMPRESSION;  Surgeon: Meredith Pel, MD;  Location: [place];  Service: Orthopedics;  Laterality: Left;  LEFT SHOULDER DIAGNOSTIC OPERATIVE ARTHROSCOPY, SUBACROMIAL DECOMPRESSION, ROTATOR CUFF TEAR REPAIR  . Port-a-cath removal N/A 04/16/2014    Procedure: REMOVAL PORT-A-CATH;  Surgeon: Stark Klein, MD;  Location: [facility];  Service: General;  Laterality: N/A;    Family History:  Family History  Problem Relation Age of Onset  . Lung cancer Father   . Hypertension Father   . Thyroid cancer Father      dx in his 49s  . Breast cancer Paternal Aunt 79  . Colon cancer Paternal Aunt     dx in her 50x  . Cervical cancer Paternal Aunt     dzx in her 68s  . Ovarian cancer Cousin     dx in her lage 62s  . Breast cancer Cousin     maternal first cousin, once removed; dx in her late 7s  . Breast cancer Cousin     maternal first cousin once removed; dx in late 28s  . Hypertension Mother   . Diabetes Mother   . Dementia Mother   . Hypertension Brother   . Seizures Brother     Alcohol induced.  . Cancer Paternal Uncle     oral cancer  . Kidney cancer Paternal Grandmother   . Cancer Cousin     several paternal cousins with brain cancer, leukemia, and other cancers    Social History:  reports that she has never smoked. She has never used smokeless tobacco. She reports that she does not drink alcohol or use illicit drugs.  Additional Social History:  Alcohol / Drug Use Pain Medications: See PTA mrds Prescriptions: See PTA med list Over the Counter: See PTA meds list History of alcohol / drug use?: No history of alcohol / drug abuse  CIWA: CIWA-Ar BP: 108/51 mmHg Pulse Rate: 63 COWS:    Allergies: No Known Allergies  Home Medications:  (Not in a hospital admission)  OB/GYN Status:  Patient's last menstrual period was 01/20/2013.  General Assessment Data Location of Assessment: [facility] Is this a Tele or Face-to-Face Assessment?: Face-to-Face Is this an Initial Assessment or a Re-assessment for this encounter?: Initial Assessment Living Arrangements: Spouse/significant other Can pt return to current living arrangement?: Yes Admission Status: Voluntary Is patient capable of signing voluntary admission?: Yes Transfer from: [facility] Referral Source: MD     [place] Living Arrangements: Spouse/significant other Name of Psychiatrist: Dr Sheppard Evens at [facility] Name of Therapist: Suezanne Cheshire (therapist)     Risk to self Suicidal Ideation: No Suicidal Intent:  No Is patient at risk for suicide?: No Suicidal Plan?: No Access to Means: No What has been your use of drugs/alcohol within the last 12 months?: N/A Previous Attempts/Gestures: No How many times?: 0 Other Self Harm Risks: N/A Triggers for Past Attempts: None known Intentional Self Injurious Behavior: None Family Suicide History: No Recent stressful life event(s): Recent negative physical changes Persecutory voices/beliefs?: No Depression: Yes Depression Symptoms: Despondent;Tearfulness;Loss of interest in usual pleasures Substance abuse history and/or treatment for substance abuse?: No Suicide prevention information given to non-admitted patients: Not applicable  Risk to Others Homicidal Ideation: No Thoughts of Harm to Others: No Current Homicidal Intent: No Current Homicidal Plan: No Access to Homicidal Means: No Identified Victim: No one  History of harm to others?: No Assessment of Violence: None Noted Violent Behavior Description: None reported Does patient have access to weapons?: No Criminal Charges Pending?: No Does patient have a court date: No  Psychosis Hallucinations: None noted Delusions: None noted  Mental Status Report Appear/Hygiene: Unremarkable;In hospital gown Eye Contact: Good Motor Activity: Freedom of movement;Unremarkable Speech: Logical/coherent Level of Consciousness: Quiet/awake Mood: Depressed;Anxious Affect: Anxious;Depressed;Sad Anxiety Level: Moderate Thought Processes: Coherent;Relevant Judgement: Unimpaired Orientation: Person;Place;Time;Situation Obsessive Compulsive Thoughts/Behaviors: None  Cognitive Functioning Concentration: Normal Memory: Recent Intact;Remote Intact IQ: Average Insight: Good Impulse Control: Good Appetite: Good Weight Loss: 0 Weight Gain: 0 Sleep: No Change Total Hours of Sleep: 8 Vegetative Symptoms: None  ADLScreening [facility] Assessment Services) Patient's cognitive ability adequate to safely  complete daily activities?: Yes Patient able to express need for assistance with ADLs?: Yes Independently performs ADLs?: Yes (appropriate for developmental age)  Prior Inpatient Therapy Prior Inpatient Therapy: No Prior Therapy Dates: N/A Prior Therapy Facilty/Provider(s): N/A Reason for Treatment: N/A  Prior Outpatient Therapy Prior Outpatient Therapy: Yes Prior Therapy Dates: Current Prior Therapy Facilty/Provider(s): Dr. Ignacia Marvel (psychiatrist), Suezanne Cheshire (therapist) Reason for Treatment: Med management & therapy  ADL Screening (condition at time of admission) Patient's cognitive ability adequate to safely complete daily activities?: Yes Is the patient deaf or have difficulty hearing?: No Does the patient have difficulty seeing, even when wearing glasses/contacts?: No Does the patient have difficulty concentrating, remembering, or making decisions?: No Patient able to express need for assistance with ADLs?: Yes Does the patient have difficulty dressing or bathing?: No Independently performs ADLs?: Yes (appropriate for developmental age) Does the patient have difficulty walking or climbing stairs?: Yes Weakness of Legs: Both Weakness of Arms/Hands: None       Abuse/Neglect Assessment (Assessment to be complete while patient is alone) Physical Abuse: Denies Verbal Abuse: Denies Sexual Abuse: Denies Exploitation of patient/patient's resources: Denies Self-Neglect: Denies Values / Beliefs Cultural Requests During Hospitalization: None Spiritual Requests During Hospitalization: None   Advance Directives (For Healthcare) Advance Directive: Patient does not have advance directive;Patient would not like information Pre-existing out of facility DNR order (yellow form or pink MOST form): No    Additional Information 1:1 In Past 12 Months?: No CIRT Risk: No Elopement Risk: No Does patient have medical clearance?: Yes     Disposition:  Disposition Initial  Assessment Completed for this Encounter: Yes Disposition of Patient: Other dispositions Other disposition(s): To current provider  On Site Evaluation by:   Reviewed with Physician:    Curlene Dolphin Ray 05/15/2014 5:09 AM

## 2014-05-15 NOTE — Assessment & Plan Note (Signed)
Pt had left rotator cuff repair approx 8 weeks ago. Surgical site healed well. Still with some mild discomfort with ROM.

## 2014-05-15 NOTE — ED Notes (Signed)
Pt expressing desire to go home.  Pt is willing to wait another 20 minutes for TTS.

## 2014-05-15 NOTE — Assessment & Plan Note (Addendum)
Continues with fairly significant neuropathy to bilat feet. Takes oxycodone for her pain PRN.

## 2014-05-15 NOTE — Assessment & Plan Note (Signed)
Pt also dx'd with shingles within last few months; and has finished her course of Acyclovir as previously directed. She still has a few scattered, healing lesins to her left hip area. Denies any specific pain to site.

## 2014-05-15 NOTE — Assessment & Plan Note (Signed)
Questionable left breast cellulitis versus mastitis. No palpable mass noted on exam.  Pt denies any recent fever or chills. Advised pt to have ED further eval left breast concerns while she is there this evening. May possibly need US/mammo for further eval.

## 2014-05-15 NOTE — Progress Notes (Signed)
[place]   Chief Complaint  Patient presents with  . Breast Pain  . Depression    HPI: Kristi [name] 58 y.o. female diagnosed with left breast cancer; and currently undergoing tamoxifen therapy.    CURRENT THERAPY: Upcoming Treatment Dates - BREAST METASTATIC Carboplatin / Gemcitabine q14d Days with orders from any treatment category:  No upcoming days in selected categories.     REASON FOR VISIT: C/o worsening depression/anxiety.  Continues to take Cymbalta as previously directed. Just saw her therapist 2 days ago.  Denies any SI or HI; but feels that her depression is progressively worsening.  Is very tearful on exam. Also concerned that her left breast has become slightly tender;and feels somewhat larger in size recently. Denies any recent fever/chills. Also continues with c/o bilat neuropathy to her feet and occ left shoulder pain status post shoulder surgery. States that her shingles rash is almost gone.    Past Medical History  Diagnosis Date  . Anxiety   . Hypertension   . MRSA (methicillin resistant Staphylococcus aureus) 2009    right groin area-no issues now. 04-07-14 PCR screen negative today.  . Depression   . Anemia     Iron deficinecy anemia  . History of radiation therapy 09/09/13-10/28/13    45 gray to left breast, lumpectomy cavity boosted to 63 gray  . Neuropathy   . Arthritis   . Breast cancer     left ,last radiation 2'15, last chemo 8'14    Past Surgical History  Procedure Laterality Date  . Anal sphincterotomy  04/2011  . Hemorrhoid surgery  04/2011    ligation  . Breast lumpectomy with needle localization Left 02/04/2013    Procedure: LEFT BREAST LUMPECTOMY WITH NEEDLE LOCALIZATION;  Surgeon: Stark Klein, MD;  Location: [place];  Service: General;  Laterality: Left;  . Axillary lymph node dissection Left 02/04/2013    Procedure: LEFT AXILLARY LYMPH NODE DISSECTION;  Surgeon: Stark Klein, MD;  Location: [place];  Service: General;   Laterality: Left;  End: 1610  . Portacath placement Right 02/04/2013    Procedure: INSERTION PORT-A-CATH;  Surgeon: Stark Klein, MD;  Location: [place];  Service: General;  Laterality: Right;  Start Time: 9604.  Marland Kitchen Appendectomy  1980  . Breast surgery      Lumpectomy in april 2014  . Shoulder arthroscopy with rotator cuff repair and subacromial decompression Left 02/24/2014    Procedure: SHOULDER ARTHROSCOPY WITH ROTATOR CUFF REPAIR AND SUBACROMIAL DECOMPRESSION;  Surgeon: Meredith Pel, MD;  Location: [place];  Service: Orthopedics;  Laterality: Left;  LEFT SHOULDER DIAGNOSTIC OPERATIVE ARTHROSCOPY, SUBACROMIAL DECOMPRESSION, ROTATOR CUFF TEAR REPAIR  . Port-a-cath removal N/A 04/16/2014    Procedure: REMOVAL PORT-A-CATH;  Surgeon: Stark Klein, MD;  [facility]: [facility];  Service: General;  Laterality: N/A;    has Anal fissure; Hemorrhoids, internal, with prolapse; Cancer of upper-inner quadrant of female breast; Contracture of axilla; CVA (cerebral infarction); TIA (transient ischemic attack); Unspecified hereditary and idiopathic peripheral neuropathy; Left shoulder pain with abduction; Hot flashes due to tamoxifen; Rotator cuff tear; Shingles; Depressed; Anxiety; and Breast pain on her problem list.     has No Known Allergies.    Medication List       This list is accurate as of: 05/14/14  6:00 PM.  Always use your most recent med list.               ALPRAZolam 1 MG tablet  Commonly known as:  Duanne Moron  Take 1-2 mg by mouth 2 (two) times daily as needed for anxiety.     amoxicillin-clavulanate 875-125 MG per tablet  Commonly known as:  AUGMENTIN  Take 1 tablet by mouth every 12 (twelve) hours.     aspirin EC 81 MG tablet  Take 162 mg by mouth daily.     carbamazepine 200 MG tablet  Commonly known as:  TEGRETOL  Take 0.5 tablets (100 mg total) by mouth 2 (two) times daily.     clonazePAM 2 MG tablet  Commonly known as:  KLONOPIN  Take 2 mg by mouth at bedtime as needed (Sleep).       DULoxetine 60 MG capsule  Commonly known as:  CYMBALTA  Take 60 mg by mouth 2 (two) times daily.     gabapentin 800 MG tablet  Commonly known as:  NEURONTIN  Take 1 tablet (800 mg total) by mouth 3 (three) times daily.     methocarbamol 500 MG tablet  Commonly known as:  ROBAXIN  Take 1 tablet (500 mg total) by mouth every 6 (six) hours as needed for muscle spasms.     multivitamin with minerals Tabs tablet  Take 1 tablet by mouth daily.     OxyCODONE 15 mg T12a 12 hr tablet  Commonly known as:  OXYCONTIN  Take 15 mg by mouth 2 (two) times daily as needed (Pain).     oxyCODONE 5 MG immediate release tablet  Commonly known as:  Oxy IR/ROXICODONE  Take 1-2 tablets (5-10 mg total) by mouth every 3 (three) hours as needed for breakthrough pain.     SUPER B COMPLEX PO  Take 1 tablet by mouth daily.     tamoxifen 20 MG tablet  Commonly known as:  NOLVADEX  Take 20 mg by mouth daily.     valsartan-hydrochlorothiazide 320-25 MG per tablet  Commonly known as:  DIOVAN-HCT  Take 1 tablet by mouth every morning.          PHYSICAL EXAMINATION  Filed Vitals:   05/14/14 1507  BP: 132/77  Pulse: 72  Temp: 98.4 F (36.9 C)  Resp: 18    GENERAL:alert, healthy, well nourished, well developed, anxious and crying SKIN: skin color, texture, turgor are normal, few scattered healing shingles lesions to left hip. No infection noted.  HEAD: Normocephalic, No masses, lesions, tenderness or abnormalities EYES: PERRLA, Conjunctiva are pink and non-injected  NECK: supple, no adenopathy, no bruits, no JVD, thyroid normal size, non-tender, without nodularity LYMPH:  no palpable lymphadenopathy BREAST:Right breast normal exam. Left breast is much larger than right breast; and does appear to have thickened skin and slightly darker skin color post-radiation. Breast nonerythematous, no warmth, no red streaks; and is not tender on exam. No new masses palpated. Previous lumpectomy site noted.   LUNGS: negative findings:  chest symmetric with normal A/P diameter, normal respiratory rate and rhythm, lungs clear to auscultation HEART: regular rate & rhythm, no murmurs and no gallops ABDOMEN:abdomen soft, non-tender, normal bowel sounds and no masses or organomegaly BACK: No CVA tenderness, Range of motion is normal EXTREMITIES:no edema, no clubbing, no cyanosis, Left shoulder wit mild tenderness with full ROM status post surgery. Surgical site healed.   NEURO: alert & oriented x 3 with fluent speech, gait normal Psych: Tearful, depressed on exam.   LABORATORY DATA:.None  ASSESSMENT/PLAN:    Cancer of upper-inner quadrant of female breast  Assessment & Plan Pt will continue to take her tamoxifen as previously directed. She appears to be tolerating  this with only c/o moderate, occ hot flashes.    Unspecified hereditary and idiopathic peripheral neuropathy  Assessment & Plan Continues with fairly significant neuropathy to bilat feet. Takes oxycodone for her pain PRN.    Left shoulder pain with abduction  Assessment & Plan Pt had left rotator cuff repair approx 8 weeks ago. Surgical site healed well. Still with some mild discomfort with ROM.    Shingles  Assessment & Plan Pt also dx'd with shingles within last few months; and has finished her course of Acyclovir as previously directed. She still has a few scattered, healing lesins to her left hip area. Denies any specific pain to site.    Depressed  Assessment & Plan Pt continues to deny either suicidal or homicidal ideation; and just saw her therapist 2 days ago. No changes have been made to her Cymbalta recently. She continues to take Xanax on PRN basis as well. She already has follow up appts with both her psychiatrist Dr. Sheppard Evens (06/04/2014) and her therapist Monique Crutchfield (05/20/14)- but feels that she still needs more emergent psych eval for her worsening depression and anxiety. Advised pt that she should go to  ED for further psych eval this evening. Pt is in agreement with this plan; and will have her daughter (who is currently sitting with patient) take her to ED.      Anxiety  Assessment & Plan Pt continues to deny either suicidal or homicidal ideation; and just saw her therapist 2 days ago. No changes have been made to her Cymbalta recently. She continues to take Xanax on PRN basis as well. She already has follow up appts with both her psychiatrist Dr. Sheppard Evens (06/04/2014) and her therapist Monique Crutchfield (05/20/14)- but feels that she still needs more emergent psych eval for her worsening depression and anxiety. Advised pt that she should go to ED for further psych eval this evening. Pt is in agreement with this plan; and will have her daughter (who is currently sitting with patient) take her to ED.     Breast pain  Assessment & Plan Questionable left breast cellulitis versus mastitis. No palpable mass noted on exam.  Pt denies any recent fever or chills. Advised pt to have ED further eval left breast concerns while she is there this evening. May possibly need US/mammo for further eval.    Patient stated understanding of all instructions; and was in agreement with this plan of care. The patient knows to call the clinic with any problems, questions or concerns.   Total time spent with patient was 60 minutes;  with greater than 80 percent of that time spent in face to face counseling regarding her depression/anxiety care options, her possible breast infection;  and coordination of care and followup.  Drue Second, NP 05/15/2014

## 2014-05-15 NOTE — Assessment & Plan Note (Signed)
Pt will continue to take her tamoxifen as previously directed. She appears to be tolerating this with only c/o moderate, occ hot flashes.

## 2014-05-15 NOTE — ED Notes (Signed)
TTS at bedside. 

## 2014-05-17 NOTE — ED Provider Notes (Signed)
Medical screening examination/treatment/procedure(s) were performed by non-physician practitioner and as supervising physician I was immediately available for consultation/collaboration.   Houston Siren III, MD 05/17/14 2028

## 2014-05-18 ENCOUNTER — Telehealth: Payer: Self-pay | Admitting: Nurse Practitioner

## 2014-05-18 NOTE — Telephone Encounter (Signed)
Per pof 7/10 pt already has f/u appt in Oct. No new orders given.

## 2014-05-25 ENCOUNTER — Ambulatory Visit: Payer: BC Managed Care – PPO | Admitting: Radiation Oncology

## 2014-05-28 ENCOUNTER — Encounter: Payer: Self-pay | Admitting: Radiation Oncology

## 2014-05-28 ENCOUNTER — Other Ambulatory Visit: Payer: BC Managed Care – PPO

## 2014-05-28 ENCOUNTER — Ambulatory Visit
Admission: RE | Admit: 2014-05-28 | Discharge: 2014-05-28 | Disposition: A | Discharge status: Home / Self Care | Payer: BC Managed Care – PPO | Source: Ambulatory Visit | Attending: Radiation Oncology | Admitting: Radiation Oncology

## 2014-05-28 ENCOUNTER — Ambulatory Visit: Payer: BC Managed Care – PPO | Admitting: Oncology

## 2014-05-28 VITALS — BP 135/73 | HR 66 | Temp 98.2°F | Ht 62.0 in | Wt 158.0 lb

## 2014-05-28 DIAGNOSIS — C50212 Malignant neoplasm of upper-inner quadrant of left female breast: Secondary | ICD-10-CM

## 2014-05-28 MED ORDER — FLUCONAZOLE 150 MG PO TABS: 150.0000 mg | ORAL_TABLET | ORAL | Status: DC

## 2014-05-28 NOTE — Progress Notes (Signed)
Radiation Oncology         [phone] ________________________________  Name: Kristi [name] MRN: [id number]  Date: 05/28/2014  DOB: 04-01-1956  Follow-Up Visit Note  CC: Ron Parker, MD  Ron Parker, MD  Diagnosis:  Multifocal invasive ductal carcinoma of the left breast (mpT1c, pN1a, pMx)    Interval Since Last Radiation:  7  months  Narrative:  The patient returns today for routine follow-up.  Interval history is significant for the patient developing cellulitis/mastitis of the left breast. Patient was placed on amoxicillin seems to help with this issue. Patient continues have some discomfort in the breast and has noticed swelling since this incident. She denies any nipple discharge or bleeding. She has developed vaginitis since her antibiotic and she concerned she has a yeast infection. The patient will be placed on Diflucan for this issue. She denies any problems with swelling in her left arm or hand.                              ALLERGIES:  has No Known Allergies.  Meds: Current Outpatient Prescriptions  Medication Sig Dispense Refill  . ALPRAZolam (XANAX) 1 MG tablet Take 1-2 mg by mouth 2 (two) times daily as needed for anxiety.       Marland Kitchen aspirin EC 81 MG tablet Take 162 mg by mouth daily.      . B Complex-C (SUPER B COMPLEX PO) Take 1 tablet by mouth daily.      Marland Kitchen doxepin (SINEQUAN) 25 MG capsule Take 25-50 mg by mouth 2 (two) times daily. Take 1 mid -day and 2 at bedtime      . DULoxetine (CYMBALTA) 60 MG capsule Take 60 mg by mouth 2 (two) times daily.      . methocarbamol (ROBAXIN) 500 MG tablet Take 1 tablet (500 mg total) by mouth every 6 (six) hours as needed for muscle spasms.  30 tablet  0  . Multiple Vitamin (MULTIVITAMIN WITH MINERALS) TABS tablet Take 1 tablet by mouth daily.      Marland Kitchen oxyCODONE (OXY IR/ROXICODONE) 5 MG immediate release tablet Take 1-2 tablets (5-10 mg total) by mouth every 3 (three) hours as needed for breakthrough pain.  30 tablet  0  .  OxyCODONE (OXYCONTIN) 15 mg T12A 12 hr tablet Take 15 mg by mouth 2 (two) times daily as needed (Pain).      . tamoxifen (NOLVADEX) 20 MG tablet Take 20 mg by mouth daily.      . valsartan-hydrochlorothiazide (DIOVAN-HCT) 320-25 MG per tablet Take 1 tablet by mouth every morning.       Marland Kitchen amoxicillin-clavulanate (AUGMENTIN) 875-125 MG per tablet Take 1 tablet by mouth every 12 (twelve) hours.  14 tablet  0  . fluconazole (DIFLUCAN) 150 MG tablet Take 1 tablet (150 mg total) by mouth 3 days.  2 tablet  0  . [DISCONTINUED] prochlorperazine (COMPAZINE) 10 MG tablet Take 1 tablet (10 mg total) by mouth every 6 (six) hours as needed (Nausea or vomiting).  30 tablet  1  . [DISCONTINUED] prochlorperazine (COMPAZINE) 25 MG suppository Place 1 suppository (25 mg total) rectally every 12 (twelve) hours as needed for nausea.  12 suppository  3   No current facility-administered medications for this encounter.    Physical Findings: The patient is in no acute distress. Patient is alert and oriented.  height is 5\' 2"  (1.575 m) and weight is 158 lb (71.668 kg). Her oral  temperature is 98.2 F (36.8 C). Her blood pressure is 135/73 and her pulse is 66. Marland Kitchen  No palpable supraclavicular or axillary adenopathy. The lungs are clear to auscultation. The heart has a regular rhythm and rate. Examination right breast reveals no palpable mass or nipple discharge or bleeding. Examination of the left breast reveals this breast to be swollen and edematous compared to the right. No dominant masses appreciated in the breast. No further signs of infection are noted.  Lab Findings: Lab Results  Component Value Date   WBC 3.4* 05/14/2014   HGB 11.3* 05/14/2014   HCT 33.4* 05/14/2014   MCV 85.4 05/14/2014   PLT 135* 05/14/2014      Radiographic Findings: No results found.  Impression:  No evidence of recurrence on clinical exam today. In light of the swelling noted in the breast,  the patient will return for followup in one  month  Plan:  Followup in one month  ____________________________________ Blair Promise, MD

## 2014-05-28 NOTE — Progress Notes (Signed)
Kristi [name] here for follow up after treatment to her left breast.  She denies pain but does have tenderness in her left breast.  She went to the ER on 05/15/14 with depression and anxiety and was found to have mastitis in her left breast.  She took amoxicillin for this and now reports that she thinks she has a yeast infection.  She has itching in her vaginal area as well as burning with urination.  She is taking tamoxifen.  The skin on her left breast is intact.  The breast appears to be swollen with stretch marks.  She reports that some of the stretch marks are new.

## 2014-06-03 ENCOUNTER — Telehealth: Payer: Self-pay

## 2014-06-03 NOTE — Telephone Encounter (Signed)
Reived office notes dtd 06/02/14 Dr. Niel Hummer.  Copy to GM.  Original to scan.

## 2014-06-10 NOTE — Telephone Encounter (Signed)
Noted  

## 2014-06-11 NOTE — Telephone Encounter (Signed)
Noted  

## 2014-07-02 ENCOUNTER — Telehealth: Payer: Self-pay | Admitting: *Deleted

## 2014-07-02 ENCOUNTER — Encounter: Payer: Self-pay | Admitting: Radiation Oncology

## 2014-07-02 ENCOUNTER — Ambulatory Visit
Admission: RE | Admit: 2014-07-02 | Discharge: 2014-07-02 | Disposition: A | Discharge status: Home / Self Care | Payer: BC Managed Care – PPO | Source: Ambulatory Visit | Attending: Radiation Oncology | Admitting: Radiation Oncology

## 2014-07-02 VITALS — BP 99/56 | HR 72 | Temp 97.9°F | Resp 20 | Ht 62.0 in | Wt 150.0 lb

## 2014-07-02 DIAGNOSIS — C50212 Malignant neoplasm of upper-inner quadrant of left female breast: Secondary | ICD-10-CM

## 2014-07-02 NOTE — Progress Notes (Signed)
Radiation Oncology         [phone] ________________________________  Name: Kristi [name] MRN: [id number]  Date: 07/02/2014  DOB: 07/17/1956  Follow-Up Visit Note  CC: Ron Parker, MD  Ron Parker, MD  Diagnosis: Multifocal invasive ductal carcinoma of the left breast (mpT1c, pN1a, pMx)      Interval Since Last Radiation:  8  months  Narrative:  The patient returns today for routine follow-up.  She is having less discomfort in the breast area. She occasionally will have sharp shooting pains within the breast these are less consistent. She has started using her compression sleeve on her left arm. Patient is taking tamoxifen and is tolerating well. She is having some fatigue but this is improving. Patient denies any nipple discharge or bleeding.                              ALLERGIES:  has No Known Allergies.  Meds: Current Outpatient Prescriptions  Medication Sig Dispense Refill  . ALPRAZolam (XANAX) 1 MG tablet Take 1-2 mg by mouth 2 (two) times daily as needed for anxiety.       Marland Kitchen aspirin EC 81 MG tablet Take 162 mg by mouth daily.      . B Complex-C (SUPER B COMPLEX PO) Take 1 tablet by mouth daily.      . DULoxetine (CYMBALTA) 60 MG capsule Take 60 mg by mouth 2 (two) times daily.      . methocarbamol (ROBAXIN) 500 MG tablet Take 1 tablet (500 mg total) by mouth every 6 (six) hours as needed for muscle spasms.  30 tablet  0  . Multiple Vitamin (MULTIVITAMIN WITH MINERALS) TABS tablet Take 1 tablet by mouth daily.      . tamoxifen (NOLVADEX) 20 MG tablet Take 20 mg by mouth daily.      . valsartan-hydrochlorothiazide (DIOVAN-HCT) 320-25 MG per tablet Take 1 tablet by mouth every morning.       Marland Kitchen doxepin (SINEQUAN) 25 MG capsule Take 25-50 mg by mouth 2 (two) times daily. Take 1 mid -day and 2 at bedtime      . oxyCODONE (OXY IR/ROXICODONE) 5 MG immediate release tablet Take 1-2 tablets (5-10 mg total) by mouth every 3 (three) hours as needed for breakthrough pain.  30  tablet  0  . OxyCODONE (OXYCONTIN) 15 mg T12A 12 hr tablet Take 15 mg by mouth 2 (two) times daily as needed (Pain).      . [DISCONTINUED] prochlorperazine (COMPAZINE) 10 MG tablet Take 1 tablet (10 mg total) by mouth every 6 (six) hours as needed (Nausea or vomiting).  30 tablet  1  . [DISCONTINUED] prochlorperazine (COMPAZINE) 25 MG suppository Place 1 suppository (25 mg total) rectally every 12 (twelve) hours as needed for nausea.  12 suppository  3   No current facility-administered medications for this encounter.    Physical Findings: The patient is in no acute distress. Patient is alert and oriented.  height is 5\' 2"  (1.575 m) and weight is 150 lb (68.04 kg). Her oral temperature is 97.9 F (36.6 C). Her blood pressure is 99/56 and her pulse is 72. Her respiration is 20. Marland Kitchen  No palpable subclavicular or axillary adenopathy. Lungs are clear to auscultation. The heart has has regular rhythm and rate. Examination right breast reveals that the large and pendulous without mass or nipple discharge. Examination of the left breast reveals continued edema,  slightly better. There are  no signs of infection within the breast.  Lab Findings: Lab Results  Component Value Date   WBC 3.4* 05/14/2014   HGB 11.3* 05/14/2014   HCT 33.4* 05/14/2014   MCV 85.4 05/14/2014   PLT 135* 05/14/2014      Radiographic Findings: No results found.  Impression:  The patient is recovering from the effects of radiation.  No signs of infection within the breast at this time.  Plan:  Routine followup 3 months. Patient will be set up for reevaluation with the lymphedema clinic concerning the patient's left breast lymphedema  ____________________________________ Blair Promise, MD

## 2014-07-02 NOTE — Progress Notes (Signed)
Kristi [name] here for follow up after treatment to her left breast.  She reports soreness and occasional sharp pain in her left breast.  She reports the swelling in her left breast may be a little better.  She is wearing a compression sleeve on her left arm.  She is taking tamoxifen.  She reports her fatigue is improving.  Her daughter reports she is tired with activity.  The skin on her left breast is intact.  She continues to having swelling in the left breast with prominent stretch marks present.  She is using cocoa butter on her skin.

## 2014-07-02 NOTE — Telephone Encounter (Signed)
Called patient to inform of PT appt. For 07-03-14- arrival time - 10 am , lvm for a return call.

## 2014-07-03 ENCOUNTER — Ambulatory Visit: Payer: BC Managed Care – PPO | Attending: Orthopedic Surgery | Admitting: Physical Therapy

## 2014-07-03 DIAGNOSIS — M25519 Pain in unspecified shoulder: Secondary | ICD-10-CM | POA: Insufficient documentation

## 2014-07-03 DIAGNOSIS — C50219 Malignant neoplasm of upper-inner quadrant of unspecified female breast: Secondary | ICD-10-CM | POA: Diagnosis not present

## 2014-07-03 DIAGNOSIS — M25619 Stiffness of unspecified shoulder, not elsewhere classified: Secondary | ICD-10-CM | POA: Insufficient documentation

## 2014-07-03 DIAGNOSIS — I10 Essential (primary) hypertension: Secondary | ICD-10-CM | POA: Diagnosis not present

## 2014-07-03 DIAGNOSIS — IMO0001 Reserved for inherently not codable concepts without codable children: Secondary | ICD-10-CM | POA: Diagnosis present

## 2014-07-07 ENCOUNTER — Ambulatory Visit: Payer: BC Managed Care – PPO | Attending: Orthopedic Surgery | Admitting: Physical Therapy

## 2014-07-07 DIAGNOSIS — C50219 Malignant neoplasm of upper-inner quadrant of unspecified female breast: Secondary | ICD-10-CM | POA: Diagnosis not present

## 2014-07-07 DIAGNOSIS — M25619 Stiffness of unspecified shoulder, not elsewhere classified: Secondary | ICD-10-CM | POA: Diagnosis not present

## 2014-07-07 DIAGNOSIS — I10 Essential (primary) hypertension: Secondary | ICD-10-CM | POA: Diagnosis not present

## 2014-07-07 DIAGNOSIS — IMO0001 Reserved for inherently not codable concepts without codable children: Secondary | ICD-10-CM | POA: Insufficient documentation

## 2014-07-07 DIAGNOSIS — M25519 Pain in unspecified shoulder: Secondary | ICD-10-CM | POA: Insufficient documentation

## 2014-07-15 ENCOUNTER — Ambulatory Visit: Payer: BC Managed Care – PPO | Admitting: Physical Therapy

## 2014-07-15 DIAGNOSIS — IMO0001 Reserved for inherently not codable concepts without codable children: Secondary | ICD-10-CM | POA: Diagnosis not present

## 2014-07-16 ENCOUNTER — Telehealth: Payer: Self-pay

## 2014-07-16 NOTE — Telephone Encounter (Signed)
Received office note dated 07/06/14 from [facility] Dr. Ace Gins.  Copy to Dr [name].  Original to scan.

## 2014-07-20 ENCOUNTER — Ambulatory Visit: Payer: BC Managed Care – PPO | Admitting: Physical Therapy

## 2014-07-20 DIAGNOSIS — IMO0001 Reserved for inherently not codable concepts without codable children: Secondary | ICD-10-CM | POA: Diagnosis not present

## 2014-07-22 ENCOUNTER — Ambulatory Visit: Payer: BC Managed Care – PPO | Admitting: Physical Therapy

## 2014-07-22 DIAGNOSIS — IMO0001 Reserved for inherently not codable concepts without codable children: Secondary | ICD-10-CM | POA: Diagnosis not present

## 2014-07-30 ENCOUNTER — Ambulatory Visit: Payer: BC Managed Care – PPO | Admitting: Physical Therapy

## 2014-07-31 ENCOUNTER — Ambulatory Visit: Payer: BC Managed Care – PPO | Admitting: Physical Therapy

## 2014-07-31 DIAGNOSIS — IMO0001 Reserved for inherently not codable concepts without codable children: Secondary | ICD-10-CM | POA: Diagnosis not present

## 2014-08-03 ENCOUNTER — Ambulatory Visit: Payer: BC Managed Care – PPO | Admitting: Physical Therapy

## 2014-08-03 DIAGNOSIS — IMO0001 Reserved for inherently not codable concepts without codable children: Secondary | ICD-10-CM | POA: Diagnosis not present

## 2014-08-05 ENCOUNTER — Ambulatory Visit: Payer: BC Managed Care – PPO | Admitting: Physical Therapy

## 2014-08-05 DIAGNOSIS — IMO0001 Reserved for inherently not codable concepts without codable children: Secondary | ICD-10-CM | POA: Diagnosis not present

## 2014-08-14 ENCOUNTER — Ambulatory Visit: Payer: BC Managed Care – PPO | Attending: Orthopedic Surgery | Admitting: Physical Therapy

## 2014-08-31 ENCOUNTER — Other Ambulatory Visit: Payer: Self-pay | Admitting: *Deleted

## 2014-08-31 DIAGNOSIS — C50219 Malignant neoplasm of upper-inner quadrant of unspecified female breast: Secondary | ICD-10-CM

## 2014-09-01 ENCOUNTER — Other Ambulatory Visit (HOSPITAL_BASED_OUTPATIENT_CLINIC_OR_DEPARTMENT_OTHER): Payer: BC Managed Care – PPO

## 2014-09-01 ENCOUNTER — Ambulatory Visit (HOSPITAL_BASED_OUTPATIENT_CLINIC_OR_DEPARTMENT_OTHER): Payer: BC Managed Care – PPO | Admitting: Hematology and Oncology

## 2014-09-01 VITALS — BP 123/65 | HR 70 | Temp 98.6°F | Resp 18 | Ht 62.0 in | Wt 156.4 lb

## 2014-09-01 DIAGNOSIS — C50212 Malignant neoplasm of upper-inner quadrant of left female breast: Secondary | ICD-10-CM

## 2014-09-01 DIAGNOSIS — I890 Lymphedema, not elsewhere classified: Secondary | ICD-10-CM

## 2014-09-01 DIAGNOSIS — C50219 Malignant neoplasm of upper-inner quadrant of unspecified female breast: Secondary | ICD-10-CM

## 2014-09-01 DIAGNOSIS — C50812 Malignant neoplasm of overlapping sites of left female breast: Secondary | ICD-10-CM

## 2014-09-01 DIAGNOSIS — Z170 Estrogen receptor positive status [ER+]: Secondary | ICD-10-CM

## 2014-09-01 LAB — CBC WITH DIFFERENTIAL/PLATELET
BASO%: 0.2 % (ref 0.0–2.0)
Basophils Absolute: 0 10*3/uL (ref 0.0–0.1)
EOS ABS: 0.1 10*3/uL (ref 0.0–0.5)
EOS%: 1.1 % (ref 0.0–7.0)
HCT: 35 % (ref 34.8–46.6)
HGB: 11.1 g/dL — ABNORMAL LOW (ref 11.6–15.9)
LYMPH%: 47.4 % (ref 14.0–49.7)
MCH: 28.7 pg (ref 25.1–34.0)
MCHC: 31.7 g/dL (ref 31.5–36.0)
MCV: 90.4 fL (ref 79.5–101.0)
MONO#: 0.3 10*3/uL (ref 0.1–0.9)
MONO%: 7.3 % (ref 0.0–14.0)
NEUT%: 44 % (ref 38.4–76.8)
NEUTROS ABS: 2 10*3/uL (ref 1.5–6.5)
PLATELETS: 132 10*3/uL — AB (ref 145–400)
RBC: 3.88 10*6/uL (ref 3.70–5.45)
RDW: 14.3 % (ref 11.2–14.5)
WBC: 4.5 10*3/uL (ref 3.9–10.3)
lymph#: 2.1 10*3/uL (ref 0.9–3.3)

## 2014-09-01 LAB — COMPREHENSIVE METABOLIC PANEL (CC13)
ALK PHOS: 62 U/L (ref 40–150)
ALT: 17 U/L (ref 0–55)
ANION GAP: 8 meq/L (ref 3–11)
AST: 19 U/L (ref 5–34)
Albumin: 3.7 g/dL (ref 3.5–5.0)
BILIRUBIN TOTAL: 0.22 mg/dL (ref 0.20–1.20)
BUN: 13.3 mg/dL (ref 7.0–26.0)
CO2: 28 mEq/L (ref 22–29)
CREATININE: 0.8 mg/dL (ref 0.6–1.1)
Calcium: 9.4 mg/dL (ref 8.4–10.4)
Chloride: 106 mEq/L (ref 98–109)
GLUCOSE: 90 mg/dL (ref 70–140)
Potassium: 3.6 mEq/L (ref 3.5–5.1)
Sodium: 141 mEq/L (ref 136–145)
Total Protein: 6.6 g/dL (ref 6.4–8.3)

## 2014-09-01 NOTE — Assessment & Plan Note (Signed)
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 followed by Taxol carboplatin later switched to [place] completed 08/21/2013, status post adjuvant radiation, currently on tamoxifen started 11/13/2013  Left breast lymphedema: I am concerned about the appearance of the breast with significant [place]. I would like to get a mammogram and ultrasound of the left breast for further evaluation of her neck to see her back in 3 weeks for followup to review the mammogram and ultrasound report.  Tamoxifen counseling: Patient has been tolerating tamoxifen extremely well. Severe neuropathy: Patient under the care of neurology and psychiatry to assist her with her pain and neuropathy issues.

## 2014-09-01 NOTE — Addendum Note (Signed)
Addended by: Prentiss Bells on: 09/01/2014 07:39 PM   Modules accepted: Orders

## 2014-09-01 NOTE — Progress Notes (Signed)
Patient Care Team: Ron Parker, MD as PCP - General (Family Medicine)  DIAGNOSIS: Primary cancer of upper inner quadrant of left female breast   Primary site: Breast (Left)   Staging method: AJCC 7th Edition   Clinical: Stage IA (T1c, N0, cM0)   Summary: Stage IA (T1c, N0, cM0)   Clinical comments: Staged at breast conference 3.26.14   SUMMARY OF ONCOLOGIC HISTORY: DIAGNOSIS: 58 year old female with stage IIA ER weakly positive, PR negative HER-2/neu negative, invasive ductal carcinoma of the left breast.  PRIOR THERAPY:  1. Earlier this year it on routine screening mammography the patient was noted to have a abnormality within the 12:00 position of the left breast. Patient returned for additional imaging with 3-D, tomosynthesis. Within the left breast there were 2 masses somewhat lobulated with irregular margins in the upper inner aspect of the left breast. These were separated by approximately a centimeter. On ultrasound the lesion the 11:00 position measured 1.0 x 0.7 cm. The 12:00 lesion measured 1.6 x 0.9 cm. In addition within the axillary region there was a 2.1 x 1.7 suspicious appearing lymph node.. The patient proceeded to undergo ultrasound-guided biopsy of the 2 areas within the left breast as well as the suspicious left axillary lymph node. Biopsies from both breast areas revealed invasive ductal carcinoma. In addition the lymph node from the left axilla revealed ductal carcinoma. The lesions appeared to be high-grade. There was no HER-2/neu amplification and hormone staining was minimal consistent with essentially consistent with triple negative breast cancer. Patient's case was discussed at the multidisciplinary breast conference her pathology and radiology were reviewed.  2. Patient underwent lumpectomy of the left breast with Dr. Barry Dienes on 02/04/13, a 1.6cm tumor was removed along with an axillary lymph node dissection that found 1/19 nodes positive for metastatic disease.  3.  Patient was then started on adjuvant chemotherapy on 03/05/13 with Adriamycin/Cytoxan. She began Taxol Carbo on 05/07/13, this was discontinued after 9 cycles due to rash. She received 2 cycles of Gemzar carbo from 9/25 - 10/16.  4. Patient is s/p adjuvant radiation on 09/09/13 - 10/28/13  5. Adjuvant curative intent tamoxifen 20 mg daily beginning 11/13/2013. Total of 5-10 years of therapy is planned. Risks benefits and side effects discussed with the patient patient consented to starting the treatment. Information was given to her.  CURRENT THERAPY: Tamoxifen 20 mg daily curative intent    CHIEF COMPLIANT: Left breast lymphedema  INTERVAL HISTORY: Kristi [name] is a 58 year old African American female with above-mentioned history of breast cancer who is here for routine followup. She has noticed over the past 3 months left breast is become markedly 80 minus. She denies any lumps or nodules in the breast. She is tolerating tamoxifen extremely well without any major problems or concerns.  REVIEW OF SYSTEMS:   Constitutional: Denies fevers, chills or abnormal weight loss Eyes: Denies blurriness of vision Ears, nose, mouth, throat, and face: Denies mucositis or sore throat Respiratory: Denies cough, dyspnea or wheezes Cardiovascular: Denies palpitation, chest discomfort or lower extremity swelling Gastrointestinal:  Denies nausea, heartburn or change in bowel habits Skin: Denies abnormal skin rashes Lymphatics: Denies new lymphadenopathy or easy bruising Neurological:Denies numbness, tingling or new weaknesses Behavioral/Psych: Mood is stable, no new changes  Breast: Left breast lymphedema All other systems were reviewed with the patient and are negative.  I have reviewed the past medical history, past surgical history, social history and family history with the patient and they are unchanged from previous  note.  ALLERGIES:  has No Known Allergies.  MEDICATIONS:  Current Outpatient  Prescriptions  Medication Sig Dispense Refill  . ALPRAZolam (XANAX) 1 MG tablet Take 1-2 mg by mouth 2 (two) times daily as needed for anxiety.       Marland Kitchen aspirin EC 81 MG tablet Take 162 mg by mouth daily.      . B Complex-C (SUPER B COMPLEX PO) Take 1 tablet by mouth daily.      Marland Kitchen doxepin (SINEQUAN) 25 MG capsule Take 25-50 mg by mouth 2 (two) times daily. Take 1 mid -day and 2 at bedtime      . DULoxetine (CYMBALTA) 60 MG capsule Take 60 mg by mouth 2 (two) times daily.      Marland Kitchen gabapentin (NEURONTIN) 800 MG tablet       . methocarbamol (ROBAXIN) 500 MG tablet Take 1 tablet (500 mg total) by mouth every 6 (six) hours as needed for muscle spasms.  30 tablet  0  . Multiple Vitamin (MULTIVITAMIN WITH MINERALS) TABS tablet Take 1 tablet by mouth daily.      Marland Kitchen oxyCODONE (OXY IR/ROXICODONE) 5 MG immediate release tablet Take 1-2 tablets (5-10 mg total) by mouth every 3 (three) hours as needed for breakthrough pain.  30 tablet  0  . OxyCODONE (OXYCONTIN) 15 mg T12A 12 hr tablet Take 15 mg by mouth 2 (two) times daily as needed (Pain).      . tamoxifen (NOLVADEX) 20 MG tablet Take 20 mg by mouth daily.      . valsartan-hydrochlorothiazide (DIOVAN-HCT) 320-25 MG per tablet Take 1 tablet by mouth every morning.       . [DISCONTINUED] prochlorperazine (COMPAZINE) 10 MG tablet Take 1 tablet (10 mg total) by mouth every 6 (six) hours as needed (Nausea or vomiting).  30 tablet  1  . [DISCONTINUED] prochlorperazine (COMPAZINE) 25 MG suppository Place 1 suppository (25 mg total) rectally every 12 (twelve) hours as needed for nausea.  12 suppository  3   No current facility-administered medications for this visit.    PHYSICAL EXAMINATION: ECOG PERFORMANCE STATUS: 1 - Symptomatic but completely ambulatory  Filed Vitals:   09/01/14 1538  BP: 123/65  Pulse: 70  Temp: 98.6 F (37 C)  Resp: 18   Filed Weights   09/01/14 1538  Weight: 156 lb 6.4 oz (70.943 kg)    GENERAL:alert, no distress and  comfortable SKIN: skin color, texture, turgor are normal, no rashes or significant lesions EYES: normal, Conjunctiva are pink and non-injected, sclera clear OROPHARYNX:no exudate, no erythema and lips, buccal mucosa, and tongue normal  NECK: supple, thyroid normal size, non-tender, without nodularity LYMPH:  no palpable lymphadenopathy in the cervical, axillary or inguinal LUNGS: clear to auscultation and percussion with normal breathing effort HEART: regular rate & rhythm and no murmurs and no lower extremity edema ABDOMEN:abdomen soft, non-tender and normal bowel sounds Musculoskeletal:no cyanosis of digits and no clubbing  NEURO: alert & oriented x 3 with fluent speech, no focal motor/sensory deficits BREAST: Marker lymphedema involving the left breast with stretch marks and [place]. No palpable axillary supraclavicular or infraclavicular adenopathy no breast tenderness or nipple discharge.   LABORATORY DATA:  I have reviewed the data as listed   Chemistry      Component Value Date/Time   NA 141 09/01/2014 1525   NA 141 05/14/2014 1822   K 3.6 09/01/2014 1525   K 3.6* 05/14/2014 1822   CL 101 05/14/2014 1822   CL 100 04/23/2013 1447  CO2 28 09/01/2014 1525   CO2 30 05/14/2014 1822   BUN 13.3 09/01/2014 1525   BUN 12 05/14/2014 1822   CREATININE 0.8 09/01/2014 1525   CREATININE 0.78 05/14/2014 1822      Component Value Date/Time   CALCIUM 9.4 09/01/2014 1525   CALCIUM 9.7 05/14/2014 1822   ALKPHOS 62 09/01/2014 1525   ALKPHOS 69 05/14/2014 1822   AST 19 09/01/2014 1525   AST 22 05/14/2014 1822   ALT 17 09/01/2014 1525   ALT 16 05/14/2014 1822   BILITOT 0.22 09/01/2014 1525   BILITOT 0.2* 05/14/2014 1822       Lab Results  Component Value Date   WBC 4.5 09/01/2014   HGB 11.1* 09/01/2014   HCT 35.0 09/01/2014   MCV 90.4 09/01/2014   PLT 132* 09/01/2014   NEUTROABS 2.0 09/01/2014     RADIOGRAPHIC STUDIES: I have personally reviewed the radiology reports and agreed with  their findings. No results found.   ASSESSMENT & PLAN:  Primary cancer of upper 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 followed by Taxol carboplatin later switched to [place] completed 08/21/2013, status post adjuvant radiation, currently on tamoxifen started 11/13/2013  Left breast lymphedema: I am concerned about the appearance of the breast with significant [place]. I would like to get a mammogram and ultrasound of the left breast for further evaluation of her neck to see her back in 3 weeks for followup to review the mammogram and ultrasound report.  Tamoxifen counseling: Patient has been tolerating tamoxifen extremely well. Severe neuropathy: Patient under the care of neurology and psychiatry to assist her with her pain and neuropathy issues.    Orders Placed This Encounter  Procedures  . MM Digital Diagnostic Unilat L    Standing Status: Future     Number of Occurrences:      Standing Expiration Date: 09/01/2015    Order Specific Question:  Reason for Exam (SYMPTOM  OR DIAGNOSIS REQUIRED)    Answer:  Left breast [place] and tenderness. Eval for breast cancer    Order Specific Question:  Is the patient pregnant?    Answer:  No    Order Specific Question:  Preferred imaging location?    Answer:  External     Comments:  [facility]  . US BREAST COMPLETE UNI LEFT INC AXILLA    Standing Status: Future     Number of Occurrences:      Standing Expiration Date: 11/02/2015    Order Specific Question:  Reason for Exam (SYMPTOM  OR DIAGNOSIS REQUIRED)    Answer:  Left breast lymphedema with h/o breast cancer    Order Specific Question:  Preferred imaging location?    Answer:  External     Comments:  [facility]   The patient has a good understanding of  the overall plan. she agrees with it. She will call with any problems that may develop before her next visit here.  I spent 20 minutes counseling the patient face to face. The total time spent in the appointment was 25 minutes and more than 50% was on counseling and review of test results    Rulon Eisenmenger, MD 09/01/2014 5:03 PM

## 2014-09-02 ENCOUNTER — Telehealth: Payer: Self-pay | Admitting: Hematology and Oncology

## 2014-09-02 NOTE — Telephone Encounter (Signed)
s.w. pt and advised on [facility] appt on 10.29 @ 9am....and NOV MD visit...pt ok adn aware

## 2014-09-07 DIAGNOSIS — Z0289 Encounter for other administrative examinations: Secondary | ICD-10-CM

## 2014-09-10 ENCOUNTER — Telehealth: Payer: Self-pay

## 2014-09-10 NOTE — Telephone Encounter (Signed)
Mammogram Results rcvd from [facility] dtd 09/03/14 Dr Marcelo Baldy.  Copy to Dr [name].  Original to scan.

## 2014-09-14 ENCOUNTER — Telehealth: Payer: Self-pay

## 2014-09-14 NOTE — Telephone Encounter (Signed)
Faxed order for diagnostic mammo with u/s if necessary to [facility].  Sent to scan.

## 2014-09-17 ENCOUNTER — Encounter: Payer: Self-pay | Admitting: Radiation Oncology

## 2014-09-17 ENCOUNTER — Ambulatory Visit
Admission: RE | Admit: 2014-09-17 | Discharge: 2014-09-17 | Disposition: A | Discharge status: Home / Self Care | Payer: BC Managed Care – PPO | Source: Ambulatory Visit | Attending: Radiation Oncology | Admitting: Radiation Oncology

## 2014-09-17 VITALS — BP 118/58 | HR 77 | Temp 98.3°F | Resp 24 | Ht 62.0 in | Wt 157.8 lb

## 2014-09-17 DIAGNOSIS — C50212 Malignant neoplasm of upper-inner quadrant of left female breast: Secondary | ICD-10-CM

## 2014-09-17 NOTE — Progress Notes (Signed)
Kristi [name] here for follow up after treatment to her left breast.  She denies pain but has soreness in her left breast.  She reports the neuropathy in her feet is getting better.  She is taking tamoxifen.  She reports her fatigue is improving.  She has swelling in her left breast and hyperpigmentation.

## 2014-09-17 NOTE — Progress Notes (Signed)
Radiation Oncology         [phone] ________________________________  Name: Kristi [name] MRN: [id number]  Date: 09/17/2014  DOB: [birth year]  Follow-Up Visit Note  CC: Ron Parker, MD  Deatra Robinson, MD    ICD-9-CM ICD-10-CM   1. 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  She continues to have swelling in her left breast. She was seen in the lymphedema clinic and given exercises to perform. Patient is been inconsistent with these exercises and I recommended the importance of this issue. She denies any nipple discharge or bleeding. She denies any problems with swelling in her left arm or hand. She does have lymphedema sleeve but is not wearing this on regular basis. Patient denies any new bony pain headaches dizziness or blurred vision. She did see medical oncology recently and in light of the significant lymphedema in the left breast and peau d'orange changes a repeat mammogram was ordered.                               ALLERGIES:  has No Known Allergies.  Meds: Current Outpatient Prescriptions  Medication Sig Dispense Refill  . ALPRAZolam (XANAX) 1 MG tablet Take 1-2 mg by mouth 2 (two) times daily as needed for anxiety.     Marland Kitchen aspirin EC 81 MG tablet Take 162 mg by mouth daily.    . B Complex-C (SUPER B COMPLEX PO) Take 1 tablet by mouth daily.    Marland Kitchen buPROPion (WELLBUTRIN) 100 MG tablet   3  . DULoxetine (CYMBALTA) 60 MG capsule Take 60 mg by mouth 2 (two) times daily.    Marland Kitchen gabapentin (NEURONTIN) 800 MG tablet 800 mg 3 (three) times daily.     Marland Kitchen L-Methylfolate-Algae-B12-B6 (METANX) 3-90.314-2-35 MG CAPS   2  . Multiple Vitamin (MULTIVITAMIN WITH MINERALS) TABS tablet Take 1 tablet by mouth daily.    . tamoxifen (NOLVADEX) 20 MG tablet Take 20  mg by mouth daily.    . valsartan-hydrochlorothiazide (DIOVAN-HCT) 320-25 MG per tablet Take 1 tablet by mouth every morning.     . [DISCONTINUED] prochlorperazine (COMPAZINE) 10 MG tablet Take 1 tablet (10 mg total) by mouth every 6 (six) hours as needed (Nausea or vomiting). 30 tablet 1  . [DISCONTINUED] prochlorperazine (COMPAZINE) 25 MG suppository Place 1 suppository (25 mg total) rectally every 12 (twelve) hours as needed for nausea. 12 suppository 3   No current facility-administered medications for this encounter.    Physical Findings: The patient is in no acute distress. Patient is alert and oriented.  height is 5\' 2"  (1.575 m) and weight is 157 lb 12.8 oz (71.578 kg). Her oral temperature is 98.3 F (36.8 C). Her blood pressure is 118/58 and her pulse is 77. Her respiration is 24. Marland Kitchen  No palpable subclavicular or axillary adenopathy. Lungs are clear to auscultation. The heart has a regular rhythm and rate. Examination right breast reveals it to be large and pendulous without mass or nipple discharge. Examination of left breast reveals continued significant edema with peau d'orange changes. No dominant masses palpable within the breast nipple discharge or bleeding. No signs of infection  Lab Findings: Lab Results  Component Value Date  WBC 4.5 09/01/2014   HGB 11.1* 09/01/2014   HCT 35.0 09/01/2014   MCV 90.4 09/01/2014   PLT 132* 09/01/2014    Radiographic Findings: No results found.  Impression:  No evidence of recurrence on clinical exam today. Continued significant lymphedema. Mammogram on 09/03/2014 showed significant skin thickening and postoperative changes but no evidence of mass calcifications or other findings within the breast.   Plan:  Routine followup in 6 months. Patient will followup later this month with medical oncology. Encouraged patient to perform massage exercises of the left breast as she was instructed from the lymphedema  clinic  ____________________________________ Blair Promise, MD

## 2014-09-22 ENCOUNTER — Ambulatory Visit (HOSPITAL_BASED_OUTPATIENT_CLINIC_OR_DEPARTMENT_OTHER): Payer: BC Managed Care – PPO | Admitting: Hematology and Oncology

## 2014-09-22 ENCOUNTER — Telehealth: Payer: Self-pay | Admitting: Hematology and Oncology

## 2014-09-22 VITALS — BP 120/69 | HR 82 | Temp 98.2°F | Resp 18 | Ht 62.0 in | Wt 159.8 lb

## 2014-09-22 DIAGNOSIS — I890 Lymphedema, not elsewhere classified: Secondary | ICD-10-CM

## 2014-09-22 DIAGNOSIS — C50212 Malignant neoplasm of upper-inner quadrant of left female breast: Secondary | ICD-10-CM

## 2014-09-22 DIAGNOSIS — C50812 Malignant neoplasm of overlapping sites of left female breast: Secondary | ICD-10-CM

## 2014-09-22 DIAGNOSIS — G629 Polyneuropathy, unspecified: Secondary | ICD-10-CM

## 2014-09-22 MED ORDER — GABAPENTIN 800 MG PO TABS: 800.0000 mg | ORAL_TABLET | Freq: Three times a day (TID) | ORAL | Status: DC

## 2014-09-22 NOTE — Progress Notes (Signed)
Patient Care Team: Ron Parker, MD as PCP - General (Family Medicine)  DIAGNOSIS: Primary cancer of upper inner quadrant of left female breast   Staging form: Breast, AJCC 7th Edition     Clinical: Stage IA (T1c, N0, cM0) - Unsigned       Staging comments: Staged at breast conference 3.26.14      Pathologic: No stage assigned - Unsigned   SUMMARY OF ONCOLOGIC HISTORY:   Primary cancer of upper inner quadrant of left female breast   01/24/2013 Initial Diagnosis Ultrasound: Left breast no position 1 x 0.7 cm, 12:00 position 1.6 x 0.9 cm in addition 2.1 cm axillary lymph node: Biopsies of breast and axilla were positive for IDC high-grade;  ER weakly positive, PR negative, HER-2 negative   02/04/2013 Surgery Left breast lumpectomy: 1.6 cm tumor 1/19 axillary lymph nodes positive, ER 6%, PR 0%, HER-2 negative, Ki-67 90%   03/05/2013 - 08/21/2013 Chemotherapy Adjuvant chemotherapy with Adriamycin and Cytoxan followed by Taxol and [place] x9 followed by [place] x2   09/09/2013 - 10/28/2013 Radiation Therapy Adjuvant radiation therapy   11/13/2013 -  Anti-estrogen oral therapy Tamoxifen 20 mg daily    CHIEF COMPLIANT: Followup of recent mammograms with history of breast cancer  INTERVAL HISTORY: Kristi [name] is a 58 year old African American lady with above-mentioned history of left breast cancer that was treated with lumpectomy followed by adjuvant chemotherapy and radiation and is currently on tamoxifen. I saw her previously and was found to have profound lymphedema in the left breast at the mammogram to evaluate this. She is here today to discuss the results of the mammogram. She has chronic problems with balance issues as well as neuropathy as well as psychiatric issues. She has joined a Architect to help with strengthening.  REVIEW OF SYSTEMS:   Constitutional: Denies fevers, chills or abnormal weight loss Eyes: Denies blurriness of vision Ears, nose, mouth, throat, and face:  Denies mucositis or sore throat Respiratory: Denies cough, dyspnea or wheezes Cardiovascular: Denies palpitation, chest discomfort or lower extremity swelling Gastrointestinal:  Denies nausea, heartburn or change in bowel habits Skin: Denies abnormal skin rashes Lymphatics: Denies new lymphadenopathy or easy bruising Neurological:neuropathy feet and balance issues Behavioral/Psych: Mood is stable, no new changes  Breast:lymphedema the breastAll other systems were reviewed with the patient and are negative.  I have reviewed the past medical history, past surgical history, social history and family history with the patient and they are unchanged from previous note.  ALLERGIES:  has No Known Allergies.  MEDICATIONS:  Current Outpatient Prescriptions  Medication Sig Dispense Refill  . ALPRAZolam (XANAX) 1 MG tablet Take 1-2 mg by mouth 2 (two) times daily as needed for anxiety.     Marland Kitchen aspirin EC 81 MG tablet Take 162 mg by mouth daily.    . B Complex-C (SUPER B COMPLEX PO) Take 1 tablet by mouth daily.    Marland Kitchen buPROPion (WELLBUTRIN) 100 MG tablet   3  . DULoxetine (CYMBALTA) 60 MG capsule Take 60 mg by mouth 2 (two) times daily.    Marland Kitchen gabapentin (NEURONTIN) 800 MG tablet Take 1 tablet (800 mg total) by mouth 3 (three) times daily. 90 tablet 2  . L-Methylfolate-Algae-B12-B6 (METANX) 3-90.314-2-35 MG CAPS   2  . Multiple Vitamin (MULTIVITAMIN WITH MINERALS) TABS tablet Take 1 tablet by mouth daily.    . tamoxifen (NOLVADEX) 20 MG tablet Take 20 mg by mouth daily.    . valsartan-hydrochlorothiazide (DIOVAN-HCT) 320-25 MG per tablet  Take 1 tablet by mouth every morning.     . [DISCONTINUED] prochlorperazine (COMPAZINE) 10 MG tablet Take 1 tablet (10 mg total) by mouth every 6 (six) hours as needed (Nausea or vomiting). 30 tablet 1  . [DISCONTINUED] prochlorperazine (COMPAZINE) 25 MG suppository Place 1 suppository (25 mg total) rectally every 12 (twelve) hours as needed for nausea. 12 suppository 3    No current facility-administered medications for this visit.    PHYSICAL EXAMINATION: ECOG PERFORMANCE STATUS: 1 - Symptomatic but completely ambulatory  Filed Vitals:   09/22/14 0823  BP: 120/69  Pulse: 82  Temp: 98.2 F (36.8 C)  Resp: 18   Filed Weights   09/22/14 0823  Weight: 159 lb 12.8 oz (72.485 kg)    GENERAL:alert, no distress and comfortable SKIN: skin color, texture, turgor are normal, no rashes or significant lesions EYES: normal, Conjunctiva are pink and non-injected, sclera clear OROPHARYNX:no exudate, no erythema and lips, buccal mucosa, and tongue normal  NECK: supple, thyroid normal size, non-tender, without nodularity LYMPH:  no palpable lymphadenopathy in the cervical, axillary or inguinal LUNGS: clear to auscultation and percussion with normal breathing effort HEART: regular rate & rhythm and no murmurs and no lower extremity edema ABDOMEN:abdomen soft, non-tender and normal bowel sounds Musculoskeletal:no cyanosis of digits and no clubbing  NEURO: peripheral neuropathy feet  LABORATORY DATA:  I have reviewed the data as listed   Chemistry      Component Value Date/Time   NA 141 09/01/2014 1525   NA 141 05/14/2014 1822   K 3.6 09/01/2014 1525   K 3.6* 05/14/2014 1822   CL 101 05/14/2014 1822   CL 100 04/23/2013 1447   CO2 28 09/01/2014 1525   CO2 30 05/14/2014 1822   BUN 13.3 09/01/2014 1525   BUN 12 05/14/2014 1822   CREATININE 0.8 09/01/2014 1525   CREATININE 0.78 05/14/2014 1822      Component Value Date/Time   CALCIUM 9.4 09/01/2014 1525   CALCIUM 9.7 05/14/2014 1822   ALKPHOS 62 09/01/2014 1525   ALKPHOS 69 05/14/2014 1822   AST 19 09/01/2014 1525   AST 22 05/14/2014 1822   ALT 17 09/01/2014 1525   ALT 16 05/14/2014 1822   BILITOT 0.22 09/01/2014 1525   BILITOT 0.2* 05/14/2014 1822       Lab Results  Component Value Date   WBC 4.5 09/01/2014   HGB 11.1* 09/01/2014   HCT 35.0 09/01/2014   MCV 90.4 09/01/2014   PLT 132*  09/01/2014   NEUTROABS 2.0 09/01/2014     RADIOGRAPHIC STUDIES: I have personally reviewed the radiology reports and agreed with their findings. Mammogram left breast normal  ASSESSMENT & PLAN:  Primary cancer of upper 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 followed by Taxol carboplatin later switched to carbo Gemzar completed 08/21/2013, status post adjuvant radiation, currently on tamoxifen started 11/13/2013  Surveillance: Patient had profound left breast lymphedema. I have reviewed the mammogram to verify that there is nothing else on the needed. The mammogram was completely normal.  Left breast lymphedema: I will refer her to physical therapy to assist her with not only the lymphedema but also for balance issues. Patient stop her exercise program with water aerobics to help with strengthening.  Neuropathy related to prior breast cancer chemotherapy:   Under the care of neurology. She is also seeking psychiatric help her with her pain issues and emotional issues. Patient told me that her distant cousin was diagnosed with metastatic breast cancer and she is very worried.   Orders Placed This Encounter  Procedures  . CBC with Differential    Standing Status: Future     Number of Occurrences:      Standing Expiration Date: 09/22/2015  . Comprehensive metabolic panel (Cmet) - [facility]    Standing Status: Future     Number of Occurrences:      Standing Expiration Date: 09/22/2015   The patient has a good understanding of the overall plan. she agrees with it. She will call with any problems that may develop before her next visit here.  Rulon Eisenmenger, MD 09/22/2014 8:41 AM

## 2014-09-22 NOTE — Assessment & Plan Note (Signed)
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 followed by Taxol carboplatin later switched to [place] completed 08/21/2013, status post adjuvant radiation, currently on tamoxifen started 11/13/2013  Surveillance: Patient had profound left breast lymphedema. I have reviewed the mammogram to verify that there is nothing else on the needed. The mammogram was completely normal.  Left breast lymphedema: I will refer her to physical therapy to assist her with not only the lymphedema but also for balance issues. Patient stop her exercise program with water aerobics to help with strengthening.  Neuropathy related to prior breast cancer chemotherapy: Under the care of neurology. She is also seeking psychiatric help her with her pain issues and emotional issues. Patient told me that her distant cousin was diagnosed with metastatic breast cancer and she is very worried.

## 2014-09-24 ENCOUNTER — Encounter: Payer: Self-pay | Admitting: Hematology and Oncology

## 2014-09-24 NOTE — Progress Notes (Signed)
Put disability paper on nurse's desk. °

## 2014-09-25 ENCOUNTER — Encounter: Payer: Self-pay | Admitting: Hematology and Oncology

## 2014-09-25 ENCOUNTER — Ambulatory Visit: Payer: BC Managed Care – PPO | Admitting: Physical Therapy

## 2014-09-25 ENCOUNTER — Ambulatory Visit: Payer: BC Managed Care – PPO | Attending: Orthopedic Surgery | Admitting: Physical Therapy

## 2014-09-25 DIAGNOSIS — R269 Unspecified abnormalities of gait and mobility: Secondary | ICD-10-CM | POA: Diagnosis present

## 2014-09-25 DIAGNOSIS — G620 Drug-induced polyneuropathy: Secondary | ICD-10-CM

## 2014-09-25 DIAGNOSIS — T451X5A Adverse effect of antineoplastic and immunosuppressive drugs, initial encounter: Secondary | ICD-10-CM

## 2014-09-25 DIAGNOSIS — G622 Polyneuropathy due to other toxic agents: Secondary | ICD-10-CM | POA: Diagnosis present

## 2014-09-25 DIAGNOSIS — I890 Lymphedema, not elsewhere classified: Secondary | ICD-10-CM | POA: Diagnosis present

## 2014-09-25 DIAGNOSIS — R29818 Other symptoms and signs involving the nervous system: Secondary | ICD-10-CM | POA: Diagnosis present

## 2014-09-25 DIAGNOSIS — R2689 Other abnormalities of gait and mobility: Secondary | ICD-10-CM

## 2014-09-25 NOTE — Progress Notes (Signed)
Faxed disability form to [place] @ [phone]

## 2014-09-25 NOTE — Therapy (Signed)
Physical Therapy Evaluation  Patient Details  Name: Kristi [name] MRN: [id number] Date of Birth: 12/15/1955  Encounter Date: 09/25/2014      PT End of Session - 09/25/14 1255    Visit Number 1   Number of Visits 8   Date for PT Re-Evaluation 10/23/14   PT Start Time 1013   PT Stop Time 1104   PT Time Calculation (min) 51 min   Activity Tolerance Patient tolerated treatment well   Behavior During Therapy [facility] for tasks assessed/performed      Past Medical History  Diagnosis Date  . Anxiety   . Hypertension   . MRSA (methicillin resistant Staphylococcus aureus) 2009    right groin area-no issues now. 04-07-14 PCR screen negative today.  . Depression   . Anemia     Iron deficinecy anemia  . History of radiation therapy 09/09/13-10/28/13    45 gray to left breast, lumpectomy cavity boosted to 63 gray  . Neuropathy   . Arthritis   . Breast cancer     left ,last radiation 2'15, last chemo 8'14    Past Surgical History  Procedure Laterality Date  . Anal sphincterotomy  04/2011  . Hemorrhoid surgery  04/2011    ligation  . Breast lumpectomy with needle localization Left 02/04/2013    Procedure: LEFT BREAST LUMPECTOMY WITH NEEDLE LOCALIZATION;  Surgeon: Stark Klein, MD;  Location: [place];  Service: General;  Laterality: Left;  . Axillary lymph node dissection Left 02/04/2013    Procedure: LEFT AXILLARY LYMPH NODE DISSECTION;  Surgeon: Stark Klein, MD;  Location: [place];  Service: General;  Laterality: Left;  End: 2355  . Portacath placement Right 02/04/2013    Procedure: INSERTION PORT-A-CATH;  Surgeon: Stark Klein, MD;  Location: [place];  Service: General;  Laterality: Right;  Start Time: 7322.  Marland Kitchen Appendectomy  1980  . Breast surgery      Lumpectomy in april 2014  . Shoulder arthroscopy with rotator cuff repair and subacromial decompression Left 02/24/2014    Procedure: SHOULDER ARTHROSCOPY WITH ROTATOR CUFF REPAIR AND SUBACROMIAL DECOMPRESSION;  Surgeon: Meredith Pel, MD;   Location: [place];  Service: Orthopedics;  Laterality: Left;  LEFT SHOULDER DIAGNOSTIC OPERATIVE ARTHROSCOPY, SUBACROMIAL DECOMPRESSION, ROTATOR CUFF TEAR REPAIR  . Port-a-cath removal N/A 04/16/2014    Procedure: REMOVAL PORT-A-CATH;  Surgeon: Stark Klein, MD;  Location: [facility];  Service: General;  Laterality: N/A;    LMP 01/20/2013  Visit Diagnosis:  Chemotherapy-induced peripheral neuropathy  Balance problems  Lymphedema of breast  Functional gait disorder      Subjective Assessment - 09/25/14 1254    Symptoms pain in feet from balls of feet toes, she reports pain in feet and balance problems as well as continued left breast edema. she has tried a compression bra but it was too painful.   She further decribes the symptoms in her feet and toes as more of a cramping or spasm pattern    Limitations Walking   How long can you sit comfortably? 2 hours   How long can you stand comfortably? more than 15 minutes   How long can you walk comfortably? walking is difficult because of balance problems   Patient Stated Goals improved balance and walking   Currently in Pain? No/denies          [facility] PT Assessment - 09/25/14 1246    Assessment   Medical Diagnosis breast cancer  chemotherapy induced peripheral neuropathy and lymphedema   Onset Date 02/05/13  Prior Therapy previous treatment in our clinic for breast lymphedema   Precautions   Precautions [place]  chemotherapy induced peripheral neuropathy   Restrictions   Weight Bearing Restrictions No   Home Environment   Living Enviornment Private residence   Living Arrangements Spouse/significant other   Available Help at Discharge Available PRN/intermittently   Type of [place] to enter   Entrance Stairs-Number of Steps [place] Two level;Bed/bath upstairs;1/2 bath on main level   Home Equipment None   Prior Function   Level of Independence Independent with basic  ADLs;Independent with homemaking with ambulation;Independent with gait;Independent with transfers   [place] Unemployed   Leisure was going to water aerobics, but she didn't like it she would like some other programs. she goes to the [employer]   Overall Cognitive Status Within Functional Limits for tasks assessed  diffculty with word finding when answering questions   Observation/Other Assessments   Observations edema in left breast difficult to measure   Sensation   Light Touch Appears Intact   Proprioception Appears Intact   Posture/Postural Control   Posture Comments shoulders appear uneven, needs futher assessment   AROM   Overall AROM  Within functional limits for tasks performed   Right Hip Flexion 5   Left Hip Flexion 5   Right Knee Extension 5   Left Knee Extension 5   Right Ankle Dorsiflexion 5   Left Ankle Dorsiflexion 5   Berg Balance Test   Sit to Stand Able to stand without using hands and stabilize independently   Standing Unsupported Able to stand safely 2 minutes   Sitting with Back Unsupported but Feet Supported on Floor or Stool Able to sit safely and securely 2 minutes   Stand to Sit Sits safely with minimal use of hands   Transfers Able to transfer safely, minor use of hands   Standing Unsupported with Eyes Closed Able to stand 10 seconds safely   Standing Ubsupported with Feet Together Able to place feet together independently and stand 1 minute safely   From Standing, Reach Forward with Outstretched Arm Can reach forward >12 cm safely (5")   From Standing Position, Pick up Object from Floor Able to pick up shoe safely and easily   From Standing Position, Turn to Look Behind Over each Shoulder Looks behind one side only/other side shows less weight shift   Turn 360 Degrees Able to turn 360 degrees safely but slowly   Standing Unsupported, Alternately Place Feet on Step/Stool Able to stand independently and safely and complete 8 steps in 20 seconds    Standing Unsupported, One Foot in Front Needs help to step but can hold 15 seconds   Standing on One Leg Able to lift leg independently and hold equal to or more than 3 seconds   Dynamic Gait Index   Level Surface Mild Impairment   Change in Gait Speed Moderate Impairment   Gait with Horizontal Head Turns Severe Impairment   Gait with Vertical Head Turns Severe Impairment   Gait and Pivot Turn Moderate Impairment   Step Over Obstacle Moderate Impairment   Step Around Obstacles Moderate Impairment   Steps Mild Impairment   Total Score 8     Berg Balance score= 47      Plan - 09/25/14 1256    Clinical Impression Statement Ms. Guttierrez has been treated for breast lympedema with minimal results. This current treatment  should focus  on the problems with her feet and balance that are impairing her mobilty and safety.    Pt will benefit from skilled therapeutic intervention in order to improve on the following deficits Decreased balance;Decreased activity tolerance;Abnormal gait;Increased muscle spasms;Increased edema   Rehab Potential Good   Clinical Impairments Affecting Rehab Potential chemotherapy induced peripheral neuropathy   PT Frequency 2x / week   PT Duration 4 weeks   PT Treatment/Interventions Electrical Stimulation;Moist Heat;Therapeutic exercise;Therapeutic activities;Balance training;Patient/family education;Neuromuscular re-education;Manual lymph drainage   PT Next Visit Plan   moist heat and massage to feet, assess 6 minute walk test with cues to use visual focal point to help with balance, begin core exercise /nu step with legs only   Consulted and Agree with Plan of Care Patient        Problem List Patient Active Problem List   Diagnosis Date Noted  . Shingles 05/15/2014  . Depressed 05/15/2014  . Anxiety 05/15/2014  . Breast pain 05/15/2014  . Rotator cuff tear 02/24/2014  . Hot flashes due to tamoxifen 02/20/2014  . Left shoulder pain with abduction 12/12/2013   . Unspecified hereditary and idiopathic peripheral neuropathy 11/11/2013  . CVA (cerebral infarction) 07/18/2013  . TIA (transient ischemic attack) 07/18/2013  . Contracture of axilla 05/20/2013  . Primary cancer of upper inner quadrant of left female breast 01/24/2013  . Anal fissure 05/03/2011  . Hemorrhoids, internal, with prolapse 05/03/2011            LYMPHEDEMA/ONCOLOGY QUESTIONNAIRE - 09/25/14 1019    Type   Cancer Type left breast cancer   Surgeries   Lumpectomy Date 02/05/13   Axillary Lymph Node Dissection Date 02/05/14   Number Lymph Nodes Removed 19   Date Lymphedema/Swelling Started   Date 04/20/14  received diagnosis of mastitis   Treatment   Active Chemotherapy Treatment No   Past Chemotherapy Treatment Yes   Date 02/23/13   Active Radiation Treatment No   Past Radiation Treatment Yes   Date 10/28/13   Body Site left chest   What other symptoms do you have   Are you Having Heaviness or Tightness --  feels it might be from the rotator cuff   Are you having Pain Yes  in feet from peripheral neuropathy   Are you having pitting edema Yes   Body Site left breast   Is it Hard or Difficult finding clothes that fit Yes   Do you have infections Yes   Comments left mastitis in june 2015             [facility] Goals - 09/25/14 1308    CC Short Term Goal  #1   Title short term goals= long term goals   Time --  4   Period Weeks   Status New          Long Term Clinic Goals - 09/25/14 1309    CC Long Term Goal  #1   Title Patient will improve dynamic gait index score to 16   Time 4   Period Weeks   Status New   CC Long Term Goal  #2   Title Patient will increase Berg Balance score to 52   Time 4   Period Weeks   Status New   CC Long Term Goal  #3   Title Patient will report 50 percent decrease in feet discomfort symptoms   Time 4   Status New         Jerzee Jerome  Bethena Midget, PT   09/25/2014, 1:16 PM

## 2014-09-29 ENCOUNTER — Encounter: Payer: Self-pay | Admitting: Hematology and Oncology

## 2014-09-29 NOTE — Progress Notes (Signed)
Faxed clinical information to [facility] @ [phone]

## 2014-10-06 ENCOUNTER — Ambulatory Visit: Payer: BC Managed Care – PPO | Attending: Orthopedic Surgery

## 2014-10-06 DIAGNOSIS — R269 Unspecified abnormalities of gait and mobility: Secondary | ICD-10-CM | POA: Insufficient documentation

## 2014-10-06 DIAGNOSIS — G622 Polyneuropathy due to other toxic agents: Secondary | ICD-10-CM | POA: Diagnosis present

## 2014-10-06 DIAGNOSIS — R29818 Other symptoms and signs involving the nervous system: Secondary | ICD-10-CM | POA: Insufficient documentation

## 2014-10-06 DIAGNOSIS — I890 Lymphedema, not elsewhere classified: Secondary | ICD-10-CM | POA: Insufficient documentation

## 2014-10-06 DIAGNOSIS — T451X5A Adverse effect of antineoplastic and immunosuppressive drugs, initial encounter: Secondary | ICD-10-CM

## 2014-10-06 DIAGNOSIS — G620 Drug-induced polyneuropathy: Secondary | ICD-10-CM

## 2014-10-06 DIAGNOSIS — R2689 Other abnormalities of gait and mobility: Secondary | ICD-10-CM

## 2014-10-06 NOTE — Therapy (Signed)
Physical Therapy Treatment  Patient Details  Name: Kristi [name] MRN: [id number] Date of Birth: 03-Mar-1956  Encounter Date: 10/06/2014      PT End of Session - 10/06/14 1108    Visit Number 2   Number of Visits 8   Date for PT Re-Evaluation 10/23/14   PT Start Time 9470   PT Stop Time 1106   PT Time Calculation (min) 43 min      Past Medical History  Diagnosis Date  . Anxiety   . Hypertension   . MRSA (methicillin resistant Staphylococcus aureus) 2009    right groin area-no issues now. 04-07-14 PCR screen negative today.  . Depression   . Anemia     Iron deficinecy anemia  . History of radiation therapy 09/09/13-10/28/13    45 gray to left breast, lumpectomy cavity boosted to 63 gray  . Neuropathy   . Arthritis   . Breast cancer     left ,last radiation 2'15, last chemo 8'14    Past Surgical History  Procedure Laterality Date  . Anal sphincterotomy  04/2011  . Hemorrhoid surgery  04/2011    ligation  . Breast lumpectomy with needle localization Left 02/04/2013    Procedure: LEFT BREAST LUMPECTOMY WITH NEEDLE LOCALIZATION;  Surgeon: Stark Klein, MD;  Location: [place];  Service: General;  Laterality: Left;  . Axillary lymph node dissection Left 02/04/2013    Procedure: LEFT AXILLARY LYMPH NODE DISSECTION;  Surgeon: Stark Klein, MD;  Location: [place];  Service: General;  Laterality: Left;  End: 9628  . Portacath placement Right 02/04/2013    Procedure: INSERTION PORT-A-CATH;  Surgeon: Stark Klein, MD;  Location: [place];  Service: General;  Laterality: Right;  Start Time: 3662.  Marland Kitchen Appendectomy  1980  . Breast surgery      Lumpectomy in april 2014  . Shoulder arthroscopy with rotator cuff repair and subacromial decompression Left 02/24/2014    Procedure: SHOULDER ARTHROSCOPY WITH ROTATOR CUFF REPAIR AND SUBACROMIAL DECOMPRESSION;  Surgeon: Meredith Pel, MD;  Location: [place];  Service: Orthopedics;  Laterality: Left;  LEFT SHOULDER DIAGNOSTIC OPERATIVE ARTHROSCOPY, SUBACROMIAL  DECOMPRESSION, ROTATOR CUFF TEAR REPAIR  . Port-a-cath removal N/A 04/16/2014    Procedure: REMOVAL PORT-A-CATH;  Surgeon: Stark Klein, MD;  [facility];  Service: General;  Laterality: N/A;    LMP 01/20/2013  Visit Diagnosis:  Chemotherapy-induced peripheral neuropathy  Balance problems  Lymphedema of breast  Functional gait disorder      Subjective Assessment - 10/06/14 1037    Symptoms No complaints of pain.Wearing some new shoes that are more supportive and I think that has helped.            [facility] Adult PT Treatment/Exercise - 10/06/14 0001    Knee/Hip Exercises: Aerobic   Stationary Bike Level 1 x7 mins   Knee/Hip Exercises: Standing   Forward Step Up Both;10 reps;Hand Hold: 1;Step Height: 6"   Wall Squat 5 seconds;10 reps   Rocker Board 3 minutes;Other (comment)  With ball toss(SBA using rehab tech)   Rocker Board Limitations Side-side and front-back 3 mins each;Very challenging for patient to balance and catch/toss, spent first minute getting balance on board   SLS Standing on bosu (dome up) in treadmill for UE support for squats x10, then alternate hip extension x12 each   Balance Exercises   Tandem Walking 3 round trips;1 round trip  24 ft one way   Gait with Head Turns (Round Trips) In hall   Cuing for increased speed,  tolerated this well                Plan - 10/06/14 1209    Clinical Impression Statement Ms. Reffner tolerated treatment very well today, she did not have any lightheadedness with any activites, though some were more challenging than others. Some LOB throughout, but none that patient couldnt self correct except once on rockerboard.   Pt will benefit from skilled therapeutic intervention in order to improve on the following deficits Decreased balance;Decreased activity tolerance;Abnormal gait;Increased muscle spasms;Increased edema   Rehab Potential Good   Clinical Impairments Affecting Rehab Potential chemotherapy induced peripheral  neuropathy   PT Frequency 2x / week   PT Duration 4 weeks   PT Treatment/Interventions Electrical Stimulation;Moist Heat;Therapeutic exercise;Therapeutic activities;Balance training;Patient/family education;Neuromuscular re-education;Manual lymph drainage   PT Next Visit Plan Assess when pt has most LOB at home/with which ADLs, continue with gait training and balance activites. Also begin core exercises/HEP.   Consulted and Agree with Plan of Care Patient        Problem List Patient Active Problem List   Diagnosis Date Noted  . Shingles 05/15/2014  . Depressed 05/15/2014  . Anxiety 05/15/2014  . Breast pain 05/15/2014  . Rotator cuff tear 02/24/2014  . Hot flashes due to tamoxifen 02/20/2014  . Left shoulder pain with abduction 12/12/2013  . Unspecified hereditary and idiopathic peripheral neuropathy 11/11/2013  . CVA (cerebral infarction) 07/18/2013  . TIA (transient ischemic attack) 07/18/2013  . Contracture of axilla 05/20/2013  . Primary cancer of upper inner quadrant of left female breast 01/24/2013  . Anal fissure 05/03/2011  . Hemorrhoids, internal, with prolapse 05/03/2011                                              Otelia Limes, PTA 10/06/2014, 12:15 PM

## 2014-10-08 ENCOUNTER — Ambulatory Visit: Payer: BC Managed Care – PPO

## 2014-10-08 DIAGNOSIS — R269 Unspecified abnormalities of gait and mobility: Secondary | ICD-10-CM

## 2014-10-08 DIAGNOSIS — R2689 Other abnormalities of gait and mobility: Secondary | ICD-10-CM

## 2014-10-08 DIAGNOSIS — G620 Drug-induced polyneuropathy: Secondary | ICD-10-CM

## 2014-10-08 DIAGNOSIS — T451X5A Adverse effect of antineoplastic and immunosuppressive drugs, initial encounter: Secondary | ICD-10-CM

## 2014-10-08 DIAGNOSIS — G622 Polyneuropathy due to other toxic agents: Secondary | ICD-10-CM | POA: Diagnosis not present

## 2014-10-08 DIAGNOSIS — I890 Lymphedema, not elsewhere classified: Secondary | ICD-10-CM

## 2014-10-08 NOTE — Therapy (Signed)
[place], [name], [address] Phone: [phone]   Fax:  [phone]  Physical Therapy Treatment  Patient Details  Name: ANIDA DEOL MRN: [id number] Date of Birth: 01/15/1956  Encounter Date: 10/08/2014      PT End of Session - 10/08/14 1351    Visit Number 3   Number of Visits 8   Date for PT Re-Evaluation 10/23/14   PT Start Time 1302   PT Stop Time 1346   PT Time Calculation (min) 44 min      Past Medical History  Diagnosis Date  . Anxiety   . Hypertension   . MRSA (methicillin resistant Staphylococcus aureus) 2009    right groin area-no issues now. 04-07-14 PCR screen negative today.  . Depression   . Anemia     Iron deficinecy anemia  . History of radiation therapy 09/09/13-10/28/13    45 gray to left breast, lumpectomy cavity boosted to 63 gray  . Neuropathy   . Arthritis   . Breast cancer     left ,last radiation 2'15, last chemo 8'14    Past Surgical History  Procedure Laterality Date  . Anal sphincterotomy  04/2011  . Hemorrhoid surgery  04/2011    ligation  . Breast lumpectomy with needle localization Left 02/04/2013    Procedure: LEFT BREAST LUMPECTOMY WITH NEEDLE LOCALIZATION;  Surgeon: Stark Klein, MD;  Location: [place];  Service: General;  Laterality: Left;  . Axillary lymph node dissection Left 02/04/2013    Procedure: LEFT AXILLARY LYMPH NODE DISSECTION;  Surgeon: Stark Klein, MD;  Location: [place];  Service: General;  Laterality: Left;  End: 9024  . Portacath placement Right 02/04/2013    Procedure: INSERTION PORT-A-CATH;  Surgeon: Stark Klein, MD;  Location: [place];  Service: General;  Laterality: Right;  Start Time: 0973.  Marland Kitchen Appendectomy  1980  . Breast surgery      Lumpectomy in april 2014  . Shoulder arthroscopy with rotator cuff repair and subacromial decompression Left 02/24/2014    Procedure: SHOULDER ARTHROSCOPY WITH ROTATOR CUFF REPAIR AND SUBACROMIAL DECOMPRESSION;  Surgeon:  Meredith Pel, MD;  Location: [place];  Service: Orthopedics;  Laterality: Left;  LEFT SHOULDER DIAGNOSTIC OPERATIVE ARTHROSCOPY, SUBACROMIAL DECOMPRESSION, ROTATOR CUFF TEAR REPAIR  . Port-a-cath removal N/A 04/16/2014    Procedure: REMOVAL PORT-A-CATH;  Surgeon: Stark Klein, MD;  Location[facility];  Service: General;  Laterality: N/A;    LMP 01/20/2013  Visit Diagnosis:  Chemotherapy-induced peripheral neuropathy  Balance problems  Lymphedema of breast  Functional gait disorder      Subjective Assessment - 10/08/14 1306    Symptoms My core was a little sore after last visit, but no other issues.    Currently in Pain? No/denies            [facility] Adult PT Treatment/Exercise - 10/08/14 0001    Knee/Hip Exercises: Aerobic   Stationary Bike Level 1 x8 mins   Knee/Hip Exercises: Standing   Heel Raises 20 reps  On bosu on treadmill for +2 UE support   Lateral Step Up Both;10 reps;Hand Hold: 1;Step Height: 6"   Lateral Step Up Limitations Needed to focus on point to decrease LOB at top of step when in SLS   Forward Step Up Both;10 reps;Hand Hold: 1;Step Height: 6"   Forward Step Up Limitations At times would catch toe on edge of step when stepping up   Wall Squat 5 seconds;10 reps   Rocker Board 2 minutes  Front-back  and side-side with 1-2 UE support   Rocker Board Limitations Very challenging for pt, able to let go up to 13 sec (best trial) at a time.   Other Standing Knee Exercises Standing on bosu in treadmill for bil UE support: Squats and bil LE hip extension 10 reps each    Balance Exercises   Tandem Walking 2 round trips  24 ft one way   Balance Exercises: Standing   Sidestepping 4 reps  Crossover in front 2x each LE                Plan - 10/08/14 1351    Clinical Impression Statement Ms. Johann had a little more fatigue today, and some difficulty with kinesio awareness of feet with placement (on step or with crossover).    Pt will benefit from skilled  therapeutic intervention in order to improve on the following deficits Decreased balance;Decreased activity tolerance;Abnormal gait;Increased muscle spasms;Increased edema   Rehab Potential Good   Clinical Impairments Affecting Rehab Potential chemotherapy induced peripheral neuropathy   PT Frequency 2x / week   PT Duration 4 weeks   PT Treatment/Interventions Electrical Stimulation;Moist Heat;Therapeutic exercise;Therapeutic activities;Balance training;Patient/family education;Neuromuscular re-education;Manual lymph drainage   PT Next Visit Plan Assess when pt has most LOB at home/with which ADLs, continue with gait training and balance activites. Also begin core exercises/HEP.   Consulted and Agree with Plan of Care Patient                              [facility] Term Clinic Goals - 10/08/14 1510    CC Long Term Goal  #3   Title Patient will report 50 percent decrease in feet discomfort symptoms   Time 4   Status Partially Met  No c/o pain this week         Problem List Patient Active Problem List   Diagnosis Date Noted  . Shingles 05/15/2014  . Depressed 05/15/2014  . Anxiety 05/15/2014  . Breast pain 05/15/2014  . Rotator cuff tear 02/24/2014  . Hot flashes due to tamoxifen 02/20/2014  . Left shoulder pain with abduction 12/12/2013  . Unspecified hereditary and idiopathic peripheral neuropathy 11/11/2013  . CVA (cerebral infarction) 07/18/2013  . TIA (transient ischemic attack) 07/18/2013  . Contracture of axilla 05/20/2013  . Primary cancer of upper inner quadrant of left female breast 01/24/2013  . Anal fissure 05/03/2011  . Hemorrhoids, internal, with prolapse 05/03/2011    Otelia Limes, PTA 10/08/2014, 3:11 PM

## 2014-10-09 ENCOUNTER — Telehealth: Payer: Self-pay

## 2014-10-09 NOTE — Telephone Encounter (Signed)
Office note rcvd from [facility] dtd 10/07/14 Dr. Ace Gins.  Copy to Dr. [name].  Original to scan.

## 2014-10-12 ENCOUNTER — Ambulatory Visit: Payer: BC Managed Care – PPO

## 2014-10-12 DIAGNOSIS — R2689 Other abnormalities of gait and mobility: Secondary | ICD-10-CM

## 2014-10-12 DIAGNOSIS — R269 Unspecified abnormalities of gait and mobility: Secondary | ICD-10-CM

## 2014-10-12 DIAGNOSIS — T451X5A Adverse effect of antineoplastic and immunosuppressive drugs, initial encounter: Secondary | ICD-10-CM

## 2014-10-12 DIAGNOSIS — G620 Drug-induced polyneuropathy: Secondary | ICD-10-CM

## 2014-10-12 DIAGNOSIS — I890 Lymphedema, not elsewhere classified: Secondary | ICD-10-CM

## 2014-10-12 DIAGNOSIS — G622 Polyneuropathy due to other toxic agents: Secondary | ICD-10-CM | POA: Diagnosis not present

## 2014-10-12 NOTE — Therapy (Signed)
[place], [name], [address] Phone: [phone]   Fax:  [phone]  Physical Therapy Treatment  Patient Details  Name: Kristi [name] MRN: [id number] Date of Birth: [birth year]  Encounter Date: 10/12/2014      PT End of Session - 10/12/14 1026    Visit Number 4   Number of Visits 8   Date for PT Re-Evaluation 10/23/14   PT Start Time 0940   PT Stop Time 1026   PT Time Calculation (min) 46 min      Past Medical History  Diagnosis Date  . Anxiety   . Hypertension   . MRSA (methicillin resistant Staphylococcus aureus) 2009    right groin area-no issues now. 04-07-14 PCR screen negative today.  . Depression   . Anemia     Iron deficinecy anemia  . History of radiation therapy 09/09/13-10/28/13    45 gray to left breast, lumpectomy cavity boosted to 63 gray  . Neuropathy   . Arthritis   . Breast cancer     left ,last radiation 2'15, last chemo 8'14    Past Surgical History  Procedure Laterality Date  . Anal sphincterotomy  04/2011  . Hemorrhoid surgery  04/2011    ligation  . Breast lumpectomy with needle localization Left 02/04/2013    Procedure: LEFT BREAST LUMPECTOMY WITH NEEDLE LOCALIZATION;  Surgeon: Stark Klein, MD;  Location: [place];  Service: General;  Laterality: Left;  . Axillary lymph node dissection Left 02/04/2013    Procedure: LEFT AXILLARY LYMPH NODE DISSECTION;  Surgeon: Stark Klein, MD;  Location: [place];  Service: General;  Laterality: Left;  End: 7741  . Portacath placement Right 02/04/2013    Procedure: INSERTION PORT-A-CATH;  Surgeon: Stark Klein, MD;  Location: [place];  Service: General;  Laterality: Right;  Start Time: 2878.  Marland Kitchen Appendectomy  1980  . Breast surgery      Lumpectomy in april 2014  . Shoulder arthroscopy with rotator cuff repair and subacromial decompression Left 02/24/2014    Procedure: SHOULDER ARTHROSCOPY WITH ROTATOR CUFF REPAIR AND SUBACROMIAL DECOMPRESSION;  Surgeon:  Meredith Pel, MD;  Location: [place];  Service: Orthopedics;  Laterality: Left;  LEFT SHOULDER DIAGNOSTIC OPERATIVE ARTHROSCOPY, SUBACROMIAL DECOMPRESSION, ROTATOR CUFF TEAR REPAIR  . Port-a-cath removal N/A 04/16/2014    Procedure: REMOVAL PORT-A-CATH;  Surgeon: Stark Klein, MD;  [facility];  Service: General;  Laterality: N/A;    LMP 01/20/2013  Visit Diagnosis:  Chemotherapy-induced peripheral neuropathy  Balance problems  Functional gait disorder  Lymphedema of breast      Subjective Assessment - 10/12/14 0947    Symptoms Noticing a small improvement with my balance and strength.            [facility] Adult PT Treatment/Exercise - 10/12/14 0001    Knee/Hip Exercises: Aerobic   Stationary Bike Level 2 x7 mins   Knee/Hip Exercises: Standing   Lateral Step Up Both;10 reps;Hand Hold: 1;Step Height: 6"   Forward Step Up Both;10 reps;Hand Hold: 1;Step Height: 6"   Wall Squat 5 seconds;10 reps   Rocker Board 2 minutes   Rocker Board Limitations Pt having more control with this today, still with some minor LOB though.    Other Standing Knee Exercises Standing on bosu in treadmill for bil UE support: Squats and bil LE hip extension 2 sets of 10 reps each    Balance Exercises   Tandem Walking 2 round trips  Then with head turns, 2 trips 24 ft  each way, CGA   Stand without Upper Extremity Support On Airex in corner standing with narrow BOS, then with eyes closed able to do for >30 sec, then in bil tandem stance with eyes open and close, very challenging  and unable to do longer than 3-5 sec before needing fingertip support on wall and SBA-CGA throughout.                Plan - 10/12/14 1126    Clinical Impression Statement Ms. Limon did very well today, tolerating increase in reps well and improvement noted with balance activities   Pt will benefit from skilled therapeutic intervention in order to improve on the following deficits Decreased balance;Decreased activity  tolerance;Abnormal gait;Increased muscle spasms;Increased edema   Rehab Potential Good   Clinical Impairments Affecting Rehab Potential chemotherapy induced peripheral neuropathy   PT Frequency 2x / week   PT Duration 4 weeks   PT Treatment/Interventions Electrical Stimulation;Moist Heat;Therapeutic exercise;Therapeutic activities;Balance training;Patient/family education;Neuromuscular re-education;Manual lymph drainage   PT Next Visit Plan Assess when pt has most LOB at home/with which ADLs, continue with gait training and balance activites. Also begin core exercises/HEP.                               Problem List Patient Active Problem List   Diagnosis Date Noted  . Shingles 05/15/2014  . Depressed 05/15/2014  . Anxiety 05/15/2014  . Breast pain 05/15/2014  . Rotator cuff tear 02/24/2014  . Hot flashes due to tamoxifen 02/20/2014  . Left shoulder pain with abduction 12/12/2013  . Unspecified hereditary and idiopathic peripheral neuropathy 11/11/2013  . CVA (cerebral infarction) 07/18/2013  . TIA (transient ischemic attack) 07/18/2013  . Contracture of axilla 05/20/2013  . Primary cancer of upper inner quadrant of left female breast 01/24/2013  . Anal fissure 05/03/2011  . Hemorrhoids, internal, with prolapse 05/03/2011    Otelia Limes, PTA 10/12/2014, 11:36 AM

## 2014-10-14 ENCOUNTER — Ambulatory Visit: Payer: BC Managed Care – PPO

## 2014-10-14 DIAGNOSIS — R2689 Other abnormalities of gait and mobility: Secondary | ICD-10-CM

## 2014-10-14 DIAGNOSIS — G620 Drug-induced polyneuropathy: Secondary | ICD-10-CM

## 2014-10-14 DIAGNOSIS — I890 Lymphedema, not elsewhere classified: Secondary | ICD-10-CM

## 2014-10-14 DIAGNOSIS — T451X5A Adverse effect of antineoplastic and immunosuppressive drugs, initial encounter: Secondary | ICD-10-CM

## 2014-10-14 DIAGNOSIS — G622 Polyneuropathy due to other toxic agents: Secondary | ICD-10-CM | POA: Diagnosis not present

## 2014-10-14 DIAGNOSIS — R269 Unspecified abnormalities of gait and mobility: Secondary | ICD-10-CM

## 2014-10-14 NOTE — Therapy (Signed)
[place], [name], [address] Phone: [phone]   Fax:  [phone]  Physical Therapy Treatment  Patient Details  Name: Kristi [name] MRN: [id number] Date of Birth: 12-28-1955  Encounter Date: 10/14/2014      PT End of Session - 10/14/14 1103    Visit Number 5   Number of Visits 8   Date for PT Re-Evaluation 10/23/14   PT Start Time 1020   PT Stop Time 1103   PT Time Calculation (min) 43 min      Past Medical History  Diagnosis Date  . Anxiety   . Hypertension   . MRSA (methicillin resistant Staphylococcus aureus) 2009    right groin area-no issues now. 04-07-14 PCR screen negative today.  . Depression   . Anemia     Iron deficinecy anemia  . History of radiation therapy 09/09/13-10/28/13    45 gray to left breast, lumpectomy cavity boosted to 63 gray  . Neuropathy   . Arthritis   . Breast cancer     left ,last radiation 2'15, last chemo 8'14    Past Surgical History  Procedure Laterality Date  . Anal sphincterotomy  04/2011  . Hemorrhoid surgery  04/2011    ligation  . Breast lumpectomy with needle localization Left 02/04/2013    Procedure: LEFT BREAST LUMPECTOMY WITH NEEDLE LOCALIZATION;  Surgeon: Stark Klein, MD;  Location: [place];  Service: General;  Laterality: Left;  . Axillary lymph node dissection Left 02/04/2013    Procedure: LEFT AXILLARY LYMPH NODE DISSECTION;  Surgeon: Stark Klein, MD;  Location: [place];  Service: General;  Laterality: Left;  End: 9470  . Portacath placement Right 02/04/2013    Procedure: INSERTION PORT-A-CATH;  Surgeon: Stark Klein, MD;  Location: [place];  Service: General;  Laterality: Right;  Start Time: 9628.  Marland Kitchen Appendectomy  1980  . Breast surgery      Lumpectomy in april 2014  . Shoulder arthroscopy with rotator cuff repair and subacromial decompression Left 02/24/2014    Procedure: SHOULDER ARTHROSCOPY WITH ROTATOR CUFF REPAIR AND SUBACROMIAL DECOMPRESSION;  Surgeon:  Meredith Pel, MD;  Location: [place];  Service: Orthopedics;  Laterality: Left;  LEFT SHOULDER DIAGNOSTIC OPERATIVE ARTHROSCOPY, SUBACROMIAL DECOMPRESSION, ROTATOR CUFF TEAR REPAIR  . Port-a-cath removal N/A 04/16/2014    Procedure: REMOVAL PORT-A-CATH;  Surgeon: Stark Klein, MD;  Location[facility];  Service: General;  Laterality: N/A;    LMP 01/20/2013  Visit Diagnosis:  Chemotherapy-induced peripheral neuropathy  Balance problems  Functional gait disorder  Lymphedema of breast      Subjective Assessment - 10/14/14 1025    Symptoms Feel like my bil foot pain is 50% better, still bothers me the most in the evening. Been sleeping in socks and feel like the compression from them helps at night.    How long can you stand comfortably? more than 15 minutes   How long can you walk comfortably? walking is difficult because of balance problems but feel like this is improving   Currently in Pain? No/denies            [facility] Adult PT Treatment/Exercise - 10/14/14 0001    Knee/Hip Exercises: Aerobic   Stationary Bike Level 2 x7 mins   Knee/Hip Exercises: Standing   Forward Step Up Both;10 reps;Hand Hold: 2;Step Height: 6"  With Airex on step also   Forward Step Up Limitations Pt with trouble intermittently clearing step    Wall Squat 5 seconds;10 reps   Rocker  Board 3 minutes  front to back 2' and side to side 1';  fingertip support   Rocker Board Limitations Improvement noted with this   SLS On Airex, bil UE support 1 min trial of each LE   SLS with Vectors with leg swings into extension, then abduction; Rt, then Lt 10 reps each, 1-2 UE support required   Other Standing Knee Exercises Standing on bosu in treadmill for bil UE support for squats 10 reps     Balance Exercises   Tandem Walking 2 round trips  with head turns using gait belt, min A required.   Balance Exercises: Standing   Sidestepping 4 reps  Crossover in front, then back each LE, with CGA   Step Over Hurdles /  Cones 24 ft 4 round trips with gait belt/CGA                 Plan - 10/14/14 1105    Clinical Impression Statement Pt had increased fatigue today and more difficulty with gait and SLS activites than she did on Monday which led to increase in LOB with activites today.   Pt will benefit from skilled therapeutic intervention in order to improve on the following deficits Decreased balance;Decreased activity tolerance;Abnormal gait;Increased muscle spasms;Increased edema   Rehab Potential Good   Clinical Impairments Affecting Rehab Potential chemotherapy induced peripheral neuropathy   PT Treatment/Interventions Electrical Stimulation;Moist Heat;Therapeutic exercise;Therapeutic activities;Balance training;Patient/family education;Neuromuscular re-education;Manual lymph drainage   PT Next Visit Plan Continue with balance/proprioception activities and issue HEP handouts. Begin core exercises. Retest Berg for goal assessment.   Consulted and Agree with Plan of Care Patient                              [facility] Goals - 10/14/14 1109    CC Long Term Goal  #3   Title Patient will report 50 percent decrease in feet discomfort symptoms   Status Achieved         Problem List Patient Active Problem List   Diagnosis Date Noted  . Shingles 05/15/2014  . Depressed 05/15/2014  . Anxiety 05/15/2014  . Breast pain 05/15/2014  . Rotator cuff tear 02/24/2014  . Hot flashes due to tamoxifen 02/20/2014  . Left shoulder pain with abduction 12/12/2013  . Unspecified hereditary and idiopathic peripheral neuropathy 11/11/2013  . CVA (cerebral infarction) 07/18/2013  . TIA (transient ischemic attack) 07/18/2013  . Contracture of axilla 05/20/2013  . Primary cancer of upper inner quadrant of left female breast 01/24/2013  . Anal fissure 05/03/2011  . Hemorrhoids, internal, with prolapse 05/03/2011    Otelia Limes, PTA 10/14/2014, 11:53 AM

## 2014-10-19 ENCOUNTER — Ambulatory Visit: Payer: BC Managed Care – PPO

## 2014-10-19 DIAGNOSIS — R2689 Other abnormalities of gait and mobility: Secondary | ICD-10-CM

## 2014-10-19 DIAGNOSIS — I890 Lymphedema, not elsewhere classified: Secondary | ICD-10-CM

## 2014-10-19 DIAGNOSIS — G620 Drug-induced polyneuropathy: Secondary | ICD-10-CM

## 2014-10-19 DIAGNOSIS — G622 Polyneuropathy due to other toxic agents: Secondary | ICD-10-CM | POA: Diagnosis not present

## 2014-10-19 DIAGNOSIS — R269 Unspecified abnormalities of gait and mobility: Secondary | ICD-10-CM

## 2014-10-19 DIAGNOSIS — T451X5A Adverse effect of antineoplastic and immunosuppressive drugs, initial encounter: Secondary | ICD-10-CM

## 2014-10-19 NOTE — Therapy (Signed)
[place], [name], [address] Phone: [phone]   Fax:  [phone]  Physical Therapy Treatment  Patient Details  Name: Kristi [name] MRN: [id number] Date of Birth: 12-09-1955  Encounter Date: 10/19/2014      PT End of Session - 10/19/14 1126    Visit Number 6   Number of Visits 8   Date for PT Re-Evaluation 10/23/14   PT Start Time 1109   PT Stop Time 1203   PT Time Calculation (min) 54 min      Past Medical History  Diagnosis Date  . Anxiety   . Hypertension   . MRSA (methicillin resistant Staphylococcus aureus) 2009    right groin area-no issues now. 04-07-14 PCR screen negative today.  . Depression   . Anemia     Iron deficinecy anemia  . History of radiation therapy 09/09/13-10/28/13    45 gray to left breast, lumpectomy cavity boosted to 63 gray  . Neuropathy   . Arthritis   . Breast cancer     left ,last radiation 2'15, last chemo 8'14    Past Surgical History  Procedure Laterality Date  . Anal sphincterotomy  04/2011  . Hemorrhoid surgery  04/2011    ligation  . Breast lumpectomy with needle localization Left 02/04/2013    Procedure: LEFT BREAST LUMPECTOMY WITH NEEDLE LOCALIZATION;  Surgeon: Stark Klein, MD;  Location: [facility];  Service: General;  Laterality: Left;  . Axillary lymph node dissection Left 02/04/2013    Procedure: LEFT AXILLARY LYMPH NODE DISSECTION;  Surgeon: Stark Klein, MD;  Location: [facility];  Service: General;  Laterality: Left;  End: 9735  . Portacath placement Right 02/04/2013    Procedure: INSERTION PORT-A-CATH;  Surgeon: Stark Klein, MD;  Location: [facility];  Service: General;  Laterality: Right;  Start Time: 3299.  Marland Kitchen Appendectomy  1980  . Breast surgery      Lumpectomy in april 2014  . Shoulder arthroscopy with rotator cuff repair and subacromial decompression Left 02/24/2014    Procedure: SHOULDER ARTHROSCOPY WITH ROTATOR CUFF REPAIR AND SUBACROMIAL DECOMPRESSION;  Surgeon:  Meredith Pel, MD;  Location: [facility];  Service: Orthopedics;  Laterality: Left;  LEFT SHOULDER DIAGNOSTIC OPERATIVE ARTHROSCOPY, SUBACROMIAL DECOMPRESSION, ROTATOR CUFF TEAR REPAIR  . Port-a-cath removal N/A 04/16/2014    Procedure: REMOVAL PORT-A-CATH;  Surgeon: Stark Klein, MD;  [facility];  Service: General;  Laterality: N/A;    LMP 01/20/2013  Visit Diagnosis:  Chemotherapy-induced peripheral neuropathy  Balance problems  Functional gait disorder  Lymphedema of breast      Subjective Assessment - 10/19/14 1118    Symptoms My feet are really hurting today, cant do any standing exercises. Got diabetic orthotics custom made, and received them on friday. Wore them for a few hours and my feet hurt some, then wore them again on Saturday and walked alot and my feet have been hurting alot since then. Did not wear them on Sunday.   Currently in Pain? Yes   Pain Score 4    Pain Location Foot   Pain Orientation Other (Comment)  bil    Pain Descriptors / Indicators Dull;Constant   Pain Type Neuropathic pain   Pain Onset In the past 7 days  Got worse than usual since Friday   Pain Frequency Constant   Aggravating Factors  New orthotics, only wore them for 2-3 hrs Friday, then 4 hrs Saturday.            [facility] Adult PT  Treatment/Exercise - 10/19/14 0001    Knee/Hip Exercises: Aerobic   Stationary Bike Level 1 x7 mins  No increase with pain    Electrical Stimulation   Electrical Stimulation Location Bil plantar surfaces of feet   Electrical Stimulation Parameters 80-150 Hz x15 mins   Electrical Stimulation Goals Pain   Manual Therapy   Massage Supine for soft tissue work to [employer] - 10/19/14 1150    Clinical Impression Statement Pt with increase pain in bil feet today, so did not tolerate balance exercises. Pt did express having relief after treatment today.   Pt will benefit from skilled therapeutic intervention in order to improve on  the following deficits Decreased balance;Decreased activity tolerance;Abnormal gait;Increased muscle spasms;Increased edema   Rehab Potential Good   Clinical Impairments Affecting Rehab Potential chemotherapy induced peripheral neuropathy   PT Frequency 2x / week   PT Duration 4 weeks   PT Treatment/Interventions Electrical Stimulation;Moist Heat;Therapeutic exercise;Therapeutic activities;Balance training;Patient/family education;Neuromuscular re-education;Manual lymph drainage   PT Next Visit Plan Assess next visit and see if todays treatment gave pt longer term relief. Continue with balance/proprioception activities and issue HEP handouts. Begin core exercises. Retest Berg for goal assessment.   Consulted and Agree with Plan of Care Patient                               Problem List Patient Active Problem List   Diagnosis Date Noted  . Shingles 05/15/2014  . Depressed 05/15/2014  . Anxiety 05/15/2014  . Breast pain 05/15/2014  . Rotator cuff tear 02/24/2014  . Hot flashes due to tamoxifen 02/20/2014  . Left shoulder pain with abduction 12/12/2013  . Unspecified hereditary and idiopathic peripheral neuropathy 11/11/2013  . CVA (cerebral infarction) 07/18/2013  . TIA (transient ischemic attack) 07/18/2013  . Contracture of axilla 05/20/2013  . Primary cancer of upper inner quadrant of left female breast 01/24/2013  . Anal fissure 05/03/2011  . Hemorrhoids, internal, with prolapse 05/03/2011    Otelia Limes, PTA 10/19/2014, 11:54 AM

## 2014-10-21 ENCOUNTER — Ambulatory Visit: Payer: BC Managed Care – PPO

## 2014-10-22 ENCOUNTER — Ambulatory Visit: Payer: BC Managed Care – PPO

## 2014-10-22 DIAGNOSIS — T451X5A Adverse effect of antineoplastic and immunosuppressive drugs, initial encounter: Secondary | ICD-10-CM

## 2014-10-22 DIAGNOSIS — R269 Unspecified abnormalities of gait and mobility: Secondary | ICD-10-CM

## 2014-10-22 DIAGNOSIS — R2689 Other abnormalities of gait and mobility: Secondary | ICD-10-CM

## 2014-10-22 DIAGNOSIS — G622 Polyneuropathy due to other toxic agents: Secondary | ICD-10-CM | POA: Diagnosis not present

## 2014-10-22 DIAGNOSIS — G620 Drug-induced polyneuropathy: Secondary | ICD-10-CM

## 2014-10-22 DIAGNOSIS — I890 Lymphedema, not elsewhere classified: Secondary | ICD-10-CM

## 2014-10-22 NOTE — Therapy (Signed)
[place], [name], [address] Phone: [phone]   Fax:  [phone]  Physical Therapy Treatment  Patient Details  Name: Kristi [name] MRN: [id number] Date of Birth: 19-Sep-1956  Encounter Date: 10/22/2014      PT End of Session - 10/22/14 1548    Visit Number 7   Number of Visits 8   Date for PT Re-Evaluation 10/23/14   PT Start Time 1522   PT Stop Time 1620   PT Time Calculation (min) 58 min      Past Medical History  Diagnosis Date  . Anxiety   . Hypertension   . MRSA (methicillin resistant Staphylococcus aureus) 2009    right groin area-no issues now. 04-07-14 PCR screen negative today.  . Depression   . Anemia     Iron deficinecy anemia  . History of radiation therapy 09/09/13-10/28/13    45 gray to left breast, lumpectomy cavity boosted to 63 gray  . Neuropathy   . Arthritis   . Breast cancer     left ,last radiation 2'15, last chemo 8'14    Past Surgical History  Procedure Laterality Date  . Anal sphincterotomy  04/2011  . Hemorrhoid surgery  04/2011    ligation  . Breast lumpectomy with needle localization Left 02/04/2013    Procedure: LEFT BREAST LUMPECTOMY WITH NEEDLE LOCALIZATION;  Surgeon: Stark Klein, MD;  Location: [facility];  Service: General;  Laterality: Left;  . Axillary lymph node dissection Left 02/04/2013    Procedure: LEFT AXILLARY LYMPH NODE DISSECTION;  Surgeon: Stark Klein, MD;  Location: [facility];  Service: General;  Laterality: Left;  End: 5462  . Portacath placement Right 02/04/2013    Procedure: INSERTION PORT-A-CATH;  Surgeon: Stark Klein, MD;  Location: [facility];  Service: General;  Laterality: Right;  Start Time: 7035.  Marland Kitchen Appendectomy  1980  . Breast surgery      Lumpectomy in april 2014  . Shoulder arthroscopy with rotator cuff repair and subacromial decompression Left 02/24/2014    Procedure: SHOULDER ARTHROSCOPY WITH ROTATOR CUFF REPAIR AND SUBACROMIAL DECOMPRESSION;   Surgeon: Meredith Pel, MD;  Location: [facility];  Service: Orthopedics;  Laterality: Left;  LEFT SHOULDER DIAGNOSTIC OPERATIVE ARTHROSCOPY, SUBACROMIAL DECOMPRESSION, ROTATOR CUFF TEAR REPAIR  . Port-a-cath removal N/A 04/16/2014    Procedure: REMOVAL PORT-A-CATH;  Surgeon: Stark Klein, MD;  [facility];  Service: General;  Laterality: N/A;    LMP 01/20/2013  Visit Diagnosis:  Chemotherapy-induced peripheral neuropathy  Balance problems  Functional gait disorder  Lymphedema of breast      Subjective Assessment - 10/22/14 1538    Symptoms Think I need to go have my toenails cut, might have ingrown toe nails on my great toes causing my pain. Relief from last treatment lasted 2 days.                    [facility] Adult PT Treatment/Exercise - 10/22/14 0001    Lumbar Exercises: Supine   Ab Set 10 reps;5 seconds  Posterior pelvic tilt   Clam 10 reps;3 seconds  With pelvic tilt   Heel Slides 10 reps  With pelvic tilt   Bent Knee Raise 10 reps  With pelvic tilt   Bridge 10 reps;3 seconds   Straight Leg Raise 10 reps;3 seconds  With core engaged   Knee/Hip Exercises: Aerobic   Stationary Bike Level 2 x7 mins   Electrical Stimulation   Electrical Stimulation Location Bil plantar surfaces of  feet   Electrical Stimulation Parameters 80-150 Hz x33mins   Electrical Stimulation Goals Pain   Manual Therapy   Massage Soft tissue work to [employer] - 10/22/14 1548    Clinical Impression Statement Pt tolerated supine exercises well without increase pain.    Pt will benefit from skilled therapeutic intervention in order to improve on the following deficits Decreased balance;Decreased activity tolerance;Abnormal gait;Increased muscle spasms;Increased edema   Rehab Potential Good   Clinical Impairments Affecting Rehab Potential chemotherapy induced peripheral neuropathy   PT Frequency 2x / week   PT Duration 4 weeks   PT  Treatment/Interventions Electrical Stimulation;Moist Heat;Therapeutic exercise;Therapeutic activities;Balance training;Patient/family education;Neuromuscular re-education;Manual lymph drainage   PT Next Visit Plan Renewal required next visit.  Continue with balance/proprioception activities and issue HEP handouts. Begin core exercises. Retest Berg for goal assessment.   Recommended Other Services Pt and daughter had questions regarding pts technique of manual lymph drainage of Lt breast. Possibly add to plan to address prn on renewal??        Problem List Patient Active Problem List   Diagnosis Date Noted  . Shingles 05/15/2014  . Depressed 05/15/2014  . Anxiety 05/15/2014  . Breast pain 05/15/2014  . Rotator cuff tear 02/24/2014  . Hot flashes due to tamoxifen 02/20/2014  . Left shoulder pain with abduction 12/12/2013  . Unspecified hereditary and idiopathic peripheral neuropathy 11/11/2013  . CVA (cerebral infarction) 07/18/2013  . TIA (transient ischemic attack) 07/18/2013  . Contracture of axilla 05/20/2013  . Primary cancer of upper inner quadrant of left female breast 01/24/2013  . Anal fissure 05/03/2011  . Hemorrhoids, internal, with prolapse 05/03/2011    Otelia Limes, PTA 10/22/2014, 4:13 PM  [place], [name], [address] Phone: [phone]   Fax:  [phone]

## 2014-10-26 ENCOUNTER — Ambulatory Visit: Payer: BC Managed Care – PPO

## 2014-10-26 DIAGNOSIS — I890 Lymphedema, not elsewhere classified: Secondary | ICD-10-CM

## 2014-10-26 DIAGNOSIS — T451X5A Adverse effect of antineoplastic and immunosuppressive drugs, initial encounter: Secondary | ICD-10-CM

## 2014-10-26 DIAGNOSIS — R269 Unspecified abnormalities of gait and mobility: Secondary | ICD-10-CM

## 2014-10-26 DIAGNOSIS — R2689 Other abnormalities of gait and mobility: Secondary | ICD-10-CM

## 2014-10-26 DIAGNOSIS — G622 Polyneuropathy due to other toxic agents: Secondary | ICD-10-CM | POA: Diagnosis not present

## 2014-10-26 DIAGNOSIS — G620 Drug-induced polyneuropathy: Secondary | ICD-10-CM

## 2014-10-26 NOTE — Therapy (Signed)
[place], [name], [address] Phone: [phone]   Fax:  [phone]  Physical Therapy Treatment  Patient Details  Name: Kristi [name] MRN: [id number] Date of Birth: October 03, 1956  Encounter Date: 10/26/2014      PT End of Session - 10/26/14 1019    Visit Number 8   Number of Visits 8   Date for PT Re-Evaluation 10/23/14   PT Start Time 0934   PT Stop Time 1032   PT Time Calculation (min) 58 min      Past Medical History  Diagnosis Date  . Anxiety   . Hypertension   . MRSA (methicillin resistant Staphylococcus aureus) 2009    right groin area-no issues now. 04-07-14 PCR screen negative today.  . Depression   . Anemia     Iron deficinecy anemia  . History of radiation therapy 09/09/13-10/28/13    45 gray to left breast, lumpectomy cavity boosted to 63 gray  . Neuropathy   . Arthritis   . Breast cancer     left ,last radiation 2'15, last chemo 8'14    Past Surgical History  Procedure Laterality Date  . Anal sphincterotomy  04/2011  . Hemorrhoid surgery  04/2011    ligation  . Breast lumpectomy with needle localization Left 02/04/2013    Procedure: LEFT BREAST LUMPECTOMY WITH NEEDLE LOCALIZATION;  Surgeon: Stark Klein, MD;  Location: [facility];  Service: General;  Laterality: Left;  . Axillary lymph node dissection Left 02/04/2013    Procedure: LEFT AXILLARY LYMPH NODE DISSECTION;  Surgeon: Stark Klein, MD;  Location: [facility];  Service: General;  Laterality: Left;  End: 8841  . Portacath placement Right 02/04/2013    Procedure: INSERTION PORT-A-CATH;  Surgeon: Stark Klein, MD;  Location: [facility];  Service: General;  Laterality: Right;  Start Time: 6606.  Marland Kitchen Appendectomy  1980  . Breast surgery      Lumpectomy in april 2014  . Shoulder arthroscopy with rotator cuff repair and subacromial decompression Left 02/24/2014    Procedure: SHOULDER ARTHROSCOPY WITH ROTATOR CUFF REPAIR AND SUBACROMIAL DECOMPRESSION;   Surgeon: Meredith Pel, MD;  Location: [facility];  Service: Orthopedics;  Laterality: Left;  LEFT SHOULDER DIAGNOSTIC OPERATIVE ARTHROSCOPY, SUBACROMIAL DECOMPRESSION, ROTATOR CUFF TEAR REPAIR  . Port-a-cath removal N/A 04/16/2014    Procedure: REMOVAL PORT-A-CATH;  Surgeon: Stark Klein, MD;  [facility];  Service: General;  Laterality: N/A;    LMP 01/20/2013  Visit Diagnosis:  Chemotherapy-induced peripheral neuropathy  Balance problems  Functional gait disorder  Lymphedema of breast      Subjective Assessment - 10/26/14 0941    Symptoms My feet felt really good over weekend, went to church and had no problems. This morning they've started bothering me again, dont know if its maybe the socks I'm wearing, i've been trying everything.                    [facility] Adult PT Treatment/Exercise - 10/26/14 0001    Knee/Hip Exercises: Aerobic   Stationary Bike Level 2 x7 mins   Knee/Hip Exercises: Standing   Forward Step Up Both;10 reps;Step Height: 6"  With overhead reach 5 reps each arm, each leg   Other Standing Knee Exercises Standing on airex in corner, eyes closed, D2 pattern bil UE 10 reps each   Moist Heat Therapy   Number Minutes Moist Heat 15 Minutes   Moist Heat Location Other (comment)  Around bil feet held in place with  gait belt   Electrical Stimulation   Electrical Stimulation Location Bil plantar surfaces of feet   Electrical Stimulation Parameters 80-150 Hz x15 mins   Electrical Stimulation Goals Pain   Balance Exercises   Tandem Walking 2 round trips  With looking Lt/Rt, using gait belt, min A required   Stand without Upper Extremity Support On Airex in corner for small ball toss, 2 sets of 1 min  Performed SLS practice between sets   Gait with Head Turns (Round Trips) Crossover in front looking up/down, 2 roundtrips using gait belt min-mod A required   Balance Exercises: Standing   SLS Eyes open;Solid surface;Intermittent HHA;30 secs;2 reps   Looking Lt/Rt, then up/down, 2 reps each at back of bike                   Short Term Clinic Goals - 09/25/14 1308    CC Short Term Goal  #1   Title short term goals= long term goals   Time --  4   Period Weeks   Status New             Long Term Clinic Goals - 10/14/14 1109    CC Long Term Goal  #3   Title Patient will report 50 percent decrease in feet discomfort symptoms   Status Achieved            Plan - 10/26/14 1021    Clinical Impression Statement Pt shows some improvement with static standing, but still demonstrates difficulty with LOB with gait activities.   Pt will benefit from skilled therapeutic intervention in order to improve on the following deficits Decreased balance;Decreased activity tolerance;Abnormal gait;Increased muscle spasms;Increased edema   Rehab Potential Good   Clinical Impairments Affecting Rehab Potential chemotherapy induced peripheral neuropathy   PT Frequency 2x / week   PT Duration 4 weeks   PT Treatment/Interventions Electrical Stimulation;Moist Heat;Therapeutic exercise;Therapeutic activities;Balance training;Patient/family education;Neuromuscular re-education;Manual lymph drainage   PT Next Visit Plan Renewal required next visit.Continue with balance/proprioception activities and issue HEP handouts. Begin core exercises. Retest Berg for goal assessment.        Problem List Patient Active Problem List   Diagnosis Date Noted  . Shingles 05/15/2014  . Depressed 05/15/2014  . Anxiety 05/15/2014  . Breast pain 05/15/2014  . Rotator cuff tear 02/24/2014  . Hot flashes due to tamoxifen 02/20/2014  . Left shoulder pain with abduction 12/12/2013  . Unspecified hereditary and idiopathic peripheral neuropathy 11/11/2013  . CVA (cerebral infarction) 07/18/2013  . TIA (transient ischemic attack) 07/18/2013  . Contracture of axilla 05/20/2013  . Primary cancer of upper inner quadrant of left female breast 01/24/2013  .  Anal fissure 05/03/2011  . Hemorrhoids, internal, with prolapse 05/03/2011    Otelia Limes, PTA 10/26/2014, 11:34 AM  [place], [name], [address] Phone: [phone]   Fax:  [phone]

## 2014-10-28 ENCOUNTER — Ambulatory Visit: Payer: BC Managed Care – PPO

## 2014-10-28 DIAGNOSIS — T451X5A Adverse effect of antineoplastic and immunosuppressive drugs, initial encounter: Secondary | ICD-10-CM

## 2014-10-28 DIAGNOSIS — R2689 Other abnormalities of gait and mobility: Secondary | ICD-10-CM

## 2014-10-28 DIAGNOSIS — G620 Drug-induced polyneuropathy: Secondary | ICD-10-CM

## 2014-10-28 DIAGNOSIS — R269 Unspecified abnormalities of gait and mobility: Secondary | ICD-10-CM

## 2014-10-28 DIAGNOSIS — I890 Lymphedema, not elsewhere classified: Secondary | ICD-10-CM

## 2014-10-28 DIAGNOSIS — G622 Polyneuropathy due to other toxic agents: Secondary | ICD-10-CM | POA: Diagnosis not present

## 2014-10-28 NOTE — Therapy (Signed)
[place], [name], [address] Phone: [phone]   Fax:  [phone]  Physical Therapy Treatment  Patient Details  Name: Kristi [name] MRN: [id number] Date of Birth: [birth year]  Encounter Date: 10/28/2014      PT End of Session - 10/28/14 1103    Visit Number 9   Date for PT Re-Evaluation 10/23/14   PT Start Time 1020   PT Stop Time 1113   PT Time Calculation (min) 53 min      Past Medical History  Diagnosis Date  . Anxiety   . Hypertension   . MRSA (methicillin resistant Staphylococcus aureus) 2009    right groin area-no issues now. 04-07-14 PCR screen negative today.  . Depression   . Anemia     Iron deficinecy anemia  . History of radiation therapy 09/09/13-10/28/13    45 gray to left breast, lumpectomy cavity boosted to 63 gray  . Neuropathy   . Arthritis   . Breast cancer     left ,last radiation 2'15, last chemo 8'14    Past Surgical History  Procedure Laterality Date  . Anal sphincterotomy  04/2011  . Hemorrhoid surgery  04/2011    ligation  . Breast lumpectomy with needle localization Left 02/04/2013    Procedure: LEFT BREAST LUMPECTOMY WITH NEEDLE LOCALIZATION;  Surgeon: Stark Klein, MD;  Location: [facility];  Service: General;  Laterality: Left;  . Axillary lymph node dissection Left 02/04/2013    Procedure: LEFT AXILLARY LYMPH NODE DISSECTION;  Surgeon: Stark Klein, MD;  Location: [facility];  Service: General;  Laterality: Left;  End: 6962  . Portacath placement Right 02/04/2013    Procedure: INSERTION PORT-A-CATH;  Surgeon: Stark Klein, MD;  Location: [facility];  Service: General;  Laterality: Right;  Start Time: 9528.  Marland Kitchen Appendectomy  1980  . Breast surgery      Lumpectomy in april 2014  . Shoulder arthroscopy with rotator cuff repair and subacromial decompression Left 02/24/2014    Procedure: SHOULDER ARTHROSCOPY WITH ROTATOR CUFF REPAIR AND SUBACROMIAL DECOMPRESSION;  Surgeon: Meredith Pel, MD;  Location: [facility];  Service: Orthopedics;  Laterality: Left;  LEFT SHOULDER DIAGNOSTIC OPERATIVE ARTHROSCOPY, SUBACROMIAL DECOMPRESSION, ROTATOR CUFF TEAR REPAIR  . Port-a-cath removal N/A 04/16/2014    Procedure: REMOVAL PORT-A-CATH;  Surgeon: Stark Klein, MD;  [facility];  Service: General;  Laterality: N/A;    LMP 01/20/2013  Visit Diagnosis:  No diagnosis found.      Subjective Assessment - 10/28/14 1022    Symptoms My feet are feeling pretty good today, just an occassional "twinge". My core was a little sore under my ribs after last appt, not sure why though.           [facility] PT Assessment - 10/28/14 0001    Berg Balance Test   Sit to Stand Able to stand without using hands and stabilize independently   Standing Unsupported Able to stand safely 2 minutes   Sitting with Back Unsupported but Feet Supported on Floor or Stool Able to sit safely and securely 2 minutes   Stand to Sit Sits safely with minimal use of hands   Transfers Able to transfer safely, minor use of hands   Standing Unsupported with Eyes Closed Able to stand 10 seconds safely   Standing Ubsupported with Feet Together Able to place feet together independently and stand 1 minute safely   From Standing, Reach Forward with Outstretched Arm Can reach confidently >25 cm (10")  From Standing Position, Pick up Object from [place] to pick up shoe safely and easily   From Standing Position, Turn to Look Behind Over each Shoulder Looks behind from both sides and weight shifts well   Turn 360 Degrees Able to turn 360 degrees safely in 4 seconds or less   Standing Unsupported, Alternately Place Feet on Step/Stool Able to stand independently and safely and complete 8 steps in 20 seconds   Standing Unsupported, One Foot in Front Able to plae foot ahead of the other independently and hold 30 seconds   Standing on One Leg Able to lift leg independently and hold > 10 seconds   Total Score 55                   [facility] Adult PT Treatment/Exercise - 10/28/14 0001    Knee/Hip Exercises: Aerobic   Stationary Bike Level 2 x7 mins   Moist Heat Therapy   Number Minutes Moist Heat 15 Minutes   Moist Heat Location Other (comment)  Around bil feet, held in place with gait belt   Electrical Stimulation   Electrical Stimulation Location Bil plantar surfaces of feet   Electrical Stimulation Parameters 80-150 Hz x15 mins   Electrical Stimulation Goals Pain   Balance Exercises   Tandem Walking 2 round trips  Looking Lt/Rt using gait belt, min A required.   Stand without Upper Extremity Support On Airex for yellow ball toss to wall chest height and overhead x1 min with SBA   Gait with Head Turns (Round Trips) Crossover in front looking up/down, 2 roundtrips using gait belt min A required; also stop/go gait 4 round trips, and then with turning Lt/Rt quick  Stop/go was challenging for pt, but able to correct min LOB                   Short Term Clinic Goals - 09/25/14 1308    CC Short Term Goal  #1   Title short term goals= long term goals   Time --  4   Period Weeks   Status New             Long Term Clinic Goals - 10/28/14 1037    CC Long Term Goal  #2   Title Patient will increase Berg Balance score to 52  Pt scored 55   Time 4   Period Weeks   Status Achieved            Plan - 10/28/14 1103    Clinical Impression Statement Pt with some improvement with gait activities today, though overall still challenging. Improved with Berg Balance test to 55, goal met.   Pt will benefit from skilled therapeutic intervention in order to improve on the following deficits Decreased balance;Decreased activity tolerance;Abnormal gait;Increased muscle spasms;Increased edema   Rehab Potential Good   Clinical Impairments Affecting Rehab Potential chemotherapy induced peripheral neuropathy   PT Frequency 2x / week   PT Duration 4 weeks   PT Treatment/Interventions  Electrical Stimulation;Moist Heat;Therapeutic exercise;Therapeutic activities;Balance training;Patient/family education;Neuromuscular re-education;Manual lymph drainage   PT Next Visit Plan Renewal required next visit.Continue with balance/proprioception activities and issue HEP handouts. Begin core exercises.         Problem List Patient Active Problem List   Diagnosis Date Noted  . Shingles 05/15/2014  . Depressed 05/15/2014  . Anxiety 05/15/2014  . Breast pain 05/15/2014  . Rotator cuff tear 02/24/2014  . Hot flashes due to tamoxifen 02/20/2014  . Left shoulder  pain with abduction 12/12/2013  . Unspecified hereditary and idiopathic peripheral neuropathy 11/11/2013  . CVA (cerebral infarction) 07/18/2013  . TIA (transient ischemic attack) 07/18/2013  . Contracture of axilla 05/20/2013  . Primary cancer of upper inner quadrant of left female breast 01/24/2013  . Anal fissure 05/03/2011  . Hemorrhoids, internal, with prolapse 05/03/2011    Otelia Limes, PTA 10/28/2014, 11:08 AM  [place], [name], [address] Phone: [phone]   Fax:  [phone]

## 2014-11-02 ENCOUNTER — Ambulatory Visit: Payer: BC Managed Care – PPO

## 2014-11-02 DIAGNOSIS — T451X5A Adverse effect of antineoplastic and immunosuppressive drugs, initial encounter: Secondary | ICD-10-CM

## 2014-11-02 DIAGNOSIS — I890 Lymphedema, not elsewhere classified: Secondary | ICD-10-CM

## 2014-11-02 DIAGNOSIS — G622 Polyneuropathy due to other toxic agents: Secondary | ICD-10-CM | POA: Diagnosis not present

## 2014-11-02 DIAGNOSIS — G620 Drug-induced polyneuropathy: Secondary | ICD-10-CM

## 2014-11-02 DIAGNOSIS — R2689 Other abnormalities of gait and mobility: Secondary | ICD-10-CM

## 2014-11-02 DIAGNOSIS — R269 Unspecified abnormalities of gait and mobility: Secondary | ICD-10-CM

## 2014-11-02 NOTE — Patient Instructions (Addendum)
Pelvic Tilt: Posterior - Legs Bent (Supine)        Can also do bridge by lifting butt up and down. This is a separate exercise from the pelvic tilt.   Tighten stomach and flatten back by rolling pelvis down. Hold _5___ seconds. Relax. Repeat __10__ times per set. Do _1-2___ sets per session. Do _1___ sessions per day.  http://orth.exer.us/203   Copyright  VHI. All rights reserved.  Heel Slide: 4-10 Inches - Sagittal Plane Stability   Slide heel down, then back up, then switch legs. Be sure pelvis does not tip forward or backward holding pelvic tilt throughout. Do _10__ times.  Do _1-2__ sets, _1__ times per day.  http://ss.exer.us/12   Copyright  VHI. All rights reserved.  Lower Abdominals Knee Down / Up   March in an alternating pattern bringing one knee up, then down, then switch legs holding pelvic tilt throughout.  Do _1-2__ sets of 10 reps, _1__ times per day.  http://ss.exer.us/28   Copyright  VHI. All rights reserved.  Hip External Rotation: Transverse Plane Stability   Have both knees bent. Open and close knees with pelvic tilt. Do __10_ times.  Do _1-2__ sets, _1__ times per day.  http://ss.exer.us/22   Copyright  VHI. All rights reserved.

## 2014-11-02 NOTE — Therapy (Signed)
[place], [name], [address] Phone: [phone]   Fax:  [phone]  Physical Therapy Treatment  Patient Details  Name: Kristi [name] MRN: [id number] Date of Birth: Apr 19, 1956  Encounter Date: 11/02/2014      PT End of Session - 11/02/14 1155    Visit Number 10   Number of Visits 16   PT Start Time 1107   PT Stop Time 1149   PT Time Calculation (min) 42 min      Past Medical History  Diagnosis Date  . Anxiety   . Hypertension   . MRSA (methicillin resistant Staphylococcus aureus) 2009    right groin area-no issues now. 04-07-14 PCR screen negative today.  . Depression   . Anemia     Iron deficinecy anemia  . History of radiation therapy 09/09/13-10/28/13    45 gray to left breast, lumpectomy cavity boosted to 63 gray  . Neuropathy   . Arthritis   . Breast cancer     left ,last radiation 2'15, last chemo 8'14    Past Surgical History  Procedure Laterality Date  . Anal sphincterotomy  04/2011  . Hemorrhoid surgery  04/2011    ligation  . Breast lumpectomy with needle localization Left 02/04/2013    Procedure: LEFT BREAST LUMPECTOMY WITH NEEDLE LOCALIZATION;  Surgeon: Stark Klein, MD;  Location: [facility];  Service: General;  Laterality: Left;  . Axillary lymph node dissection Left 02/04/2013    Procedure: LEFT AXILLARY LYMPH NODE DISSECTION;  Surgeon: Stark Klein, MD;  Location: [facility];  Service: General;  Laterality: Left;  End: 3729  . Portacath placement Right 02/04/2013    Procedure: INSERTION PORT-A-CATH;  Surgeon: Stark Klein, MD;  Location: [facility];  Service: General;  Laterality: Right;  Start Time: 0211.  Marland Kitchen Appendectomy  1980  . Breast surgery      Lumpectomy in april 2014  . Shoulder arthroscopy with rotator cuff repair and subacromial decompression Left 02/24/2014    Procedure: SHOULDER ARTHROSCOPY WITH ROTATOR CUFF REPAIR AND SUBACROMIAL DECOMPRESSION;  Surgeon: Meredith Pel, MD;   Location: [name];  Service: Orthopedics;  Laterality: Left;  LEFT SHOULDER DIAGNOSTIC OPERATIVE ARTHROSCOPY, SUBACROMIAL DECOMPRESSION, ROTATOR CUFF TEAR REPAIR  . Port-a-cath removal N/A 04/16/2014    Procedure: REMOVAL PORT-A-CATH;  Surgeon: Stark Klein, MD;  Location: [facility];  Service: General;  Laterality: N/A;    LMP 01/20/2013  Visit Diagnosis:  Chemotherapy-induced peripheral neuropathy  Balance problems  Functional gait disorder  Lymphedema of breast      Subjective Assessment - 11/02/14 1109    Symptoms Saturday was a bad day, wore my sneakers for a few hours and then my feet bothered all night to the point of needing to take pain meds to get to sleep that night, but yesterday and today have been really good. Im going to stop wearing those sneakers for awhile.  Have an appt today to see a foot doctor to get my toe nails cut and going to ask them about my orthotic issues.                     [facility] Adult PT Treatment/Exercise - 11/02/14 0001    Lumbar Exercises: Supine   Ab Set 10 reps  Posterior pelvic tilts   Clam 10 reps;3 seconds  With posterior pelvic tilt   Heel Slides 10 reps  With pelvic tilt    Bridge 10 reps;5 seconds   Straight Leg Raise 10  reps;3 seconds  With pelvic tilt   Knee/Hip Exercises: Aerobic   Stationary Bike Level 2 x8 mins   Knee/Hip Exercises: Standing   Rocker Board 2 minutes  Front-back and side-side with Lt/Rt head turns throughout   Other Standing Knee Exercises Standing on [employer] with narrow BOS for small ball toss to wall x1 min with CGA                PT Education - 11/02/14 1155    Education provided Yes   Education Details Core strengthening   Person(s) Educated Patient   Methods Explanation;Demonstration;Verbal cues;Handout   Comprehension Verbalized understanding;Returned demonstration;Need further instruction;Verbal cues required           Short Term Clinic Goals - 09/25/14 1308    CC Short Term Goal   #1   Title short term goals= long term goals   Time --  4   Period Weeks   Status New             Long Term Clinic Goals - 11/02/14 [phone]    CC Long Term Goal  #1   Title Patient will improve dynamic gait index score to 16   Time 8   Period Weeks   Status On-going   CC Long Term Goal  #2   Title Patient will increase Berg Balance score to 52   Time 8   Period Weeks   Status Achieved   CC Long Term Goal  #3   Title Patient will report 50 percent decrease in feet discomfort symptoms   Time 8   Period Weeks   Status On-going   CC Long Term Goal  #4   Title pt will report 50% decrease in breast lymphedema symptoms.   Period Weeks   Status New            Plan - 11/02/14 1156    Clinical Impression Statement Instructed pt in core exercises today per primary plan of care, she seemed to have a good understanding of these and pt to continue these at home.    Pt will benefit from skilled therapeutic intervention in order to improve on the following deficits Decreased balance;Decreased activity tolerance;Abnormal gait;Increased muscle spasms;Increased edema   Rehab Potential Good   Clinical Impairments Affecting Rehab Potential chemotherapy induced peripheral neuropathy   PT Frequency 2x / week   PT Duration 8 weeks   PT Treatment/Interventions Electrical Stimulation;Moist Heat;Therapeutic exercise;Therapeutic activities;Balance training;Patient/family education;Neuromuscular re-education;Manual lymph drainage   PT Next Visit Plan Resume/Continue with balance/proprioception activities and issue HEP handouts. Lt breast manual lymph drainage prn per renewal for review for pts technique as she has questions.   Consulted and Agree with Plan of Care Patient        Problem List Patient Active Problem List   Diagnosis Date Noted  . Shingles 05/15/2014  . Depressed 05/15/2014  . Anxiety 05/15/2014  . Breast pain 05/15/2014  . Rotator cuff tear 02/24/2014  . Hot flashes due  to tamoxifen 02/20/2014  . Left shoulder pain with abduction 12/12/2013  . Unspecified hereditary and idiopathic peripheral neuropathy 11/11/2013  . CVA (cerebral infarction) 07/18/2013  . TIA (transient ischemic attack) 07/18/2013  . Contracture of axilla 05/20/2013  . Primary cancer of upper inner quadrant of left female breast 01/24/2013  . Anal fissure 05/03/2011  . Hemorrhoids, internal, with prolapse 05/03/2011    Otelia Limes, PTA 11/02/2014, 12:00 PM  [place], [name], [address]  Phone: [phone]   Fax:  [phone]

## 2014-11-04 ENCOUNTER — Ambulatory Visit: Payer: BC Managed Care – PPO

## 2014-11-04 DIAGNOSIS — G620 Drug-induced polyneuropathy: Secondary | ICD-10-CM

## 2014-11-04 DIAGNOSIS — T451X5A Adverse effect of antineoplastic and immunosuppressive drugs, initial encounter: Secondary | ICD-10-CM

## 2014-11-04 DIAGNOSIS — G622 Polyneuropathy due to other toxic agents: Secondary | ICD-10-CM | POA: Diagnosis not present

## 2014-11-04 DIAGNOSIS — R2689 Other abnormalities of gait and mobility: Secondary | ICD-10-CM

## 2014-11-04 DIAGNOSIS — I890 Lymphedema, not elsewhere classified: Secondary | ICD-10-CM

## 2014-11-04 DIAGNOSIS — R269 Unspecified abnormalities of gait and mobility: Secondary | ICD-10-CM

## 2014-11-04 NOTE — Therapy (Signed)
[place], [name], [address] Phone: [phone]   Fax:  [phone]  Physical Therapy Treatment  Patient Details  Name: Kristi [name] MRN: [id number] Date of Birth: 03-Jun-1956  Encounter Date: 11/04/2014      PT End of Session - 11/04/14 1150    Visit Number 11   Number of Visits 16   Date for PT Re-Evaluation 12/18/14   PT Start Time 1055   PT Stop Time 1141   PT Time Calculation (min) 46 min      Past Medical History  Diagnosis Date  . Anxiety   . Hypertension   . MRSA (methicillin resistant Staphylococcus aureus) 2009    right groin area-no issues now. 04-07-14 PCR screen negative today.  . Depression   . Anemia     Iron deficinecy anemia  . History of radiation therapy 09/09/13-10/28/13    45 gray to left breast, lumpectomy cavity boosted to 63 gray  . Neuropathy   . Arthritis   . Breast cancer     left ,last radiation 2'15, last chemo 8'14    Past Surgical History  Procedure Laterality Date  . Anal sphincterotomy  04/2011  . Hemorrhoid surgery  04/2011    ligation  . Breast lumpectomy with needle localization Left 02/04/2013    Procedure: LEFT BREAST LUMPECTOMY WITH NEEDLE LOCALIZATION;  Surgeon: Stark Klein, MD;  Location: [facility];  Service: General;  Laterality: Left;  . Axillary lymph node dissection Left 02/04/2013    Procedure: LEFT AXILLARY LYMPH NODE DISSECTION;  Surgeon: Stark Klein, MD;  Location: [facility];  Service: General;  Laterality: Left;  End: 4431  . Portacath placement Right 02/04/2013    Procedure: INSERTION PORT-A-CATH;  Surgeon: Stark Klein, MD;  Location: [facility];  Service: General;  Laterality: Right;  Start Time: 5400.  Marland Kitchen Appendectomy  1980  . Breast surgery      Lumpectomy in april 2014  . Shoulder arthroscopy with rotator cuff repair and subacromial decompression Left 02/24/2014    Procedure: SHOULDER ARTHROSCOPY WITH ROTATOR CUFF REPAIR AND SUBACROMIAL  DECOMPRESSION;  Surgeon: Meredith Pel, MD;  Location: [facility];  Service: Orthopedics;  Laterality: Left;  LEFT SHOULDER DIAGNOSTIC OPERATIVE ARTHROSCOPY, SUBACROMIAL DECOMPRESSION, ROTATOR CUFF TEAR REPAIR  . Port-a-cath removal N/A 04/16/2014    Procedure: REMOVAL PORT-A-CATH;  Surgeon: Stark Klein, MD;  [facility];  Service: General;  Laterality: N/A;    LMP 01/20/2013  Visit Diagnosis:  Chemotherapy-induced peripheral neuropathy  Balance problems  Functional gait disorder  Lymphedema of breast      Subjective Assessment - 11/04/14 1053    Symptoms The foot doctor visit wasnt very helpful as far as questions about my orthotics go, but he did cut my toenails and said I dont have an ingrown toenail and my circulation is good. Havent had any flare up of foot pain since last visit.And my Lt breast swelling is okay today.    Limitations Walking   How long can you stand comfortably? about 30 mins   Currently in Pain? No/denies                    [facility] Adult PT Treatment/Exercise - 11/04/14 0001    Knee/Hip Exercises: Aerobic   Stationary Bike Level 2 x8 mins   Knee/Hip Exercises: Standing   Walking with Sports Cord 4 ways with 3 plates backwards and 2 plates all other directions, PTA provided min-mod A for bil side stepping, CGA  for front and back   Bil side stepping very challenging for pt, mult LOB   Balance Exercises   Tandem Walking 2 round trips  With looking up/down and Lt/Rt in // bars   March on Foam/Wedge 10 reps  2 sets in // bars, on fitter board   Gait with Head Turns (Round Trips) [place] Lt and Rt 2 trips each in // bars    Balance Exercises: Standing   Retro Gait 4 reps  In // bars, looking Lt/Rt last 2 trips,tandem&challenging                    Short Term Clinic Goals - 09/25/14 1308    CC Short Term Goal  #1   Title short term goals= long term goals   Time --  4   Period Weeks   Status New             Long Term  Clinic Goals - 11/04/14 1103    CC Long Term Goal  #3   Title Patient will report 50 percent decrease in feet discomfort symptoms  60% improvement reported.   Status Achieved            Plan - 11/04/14 1150    Clinical Impression Statement Progressed pts proprioception and balance activities today and they were very challenging for pt, but no increase in foot pain. Reports of lightheadedness only with Lt/Rt head turns during retro tandem gait, which resolved as soon as soon as we completed activity.    Pt will benefit from skilled therapeutic intervention in order to improve on the following deficits Decreased balance;Decreased activity tolerance;Abnormal gait;Increased muscle spasms;Increased edema   Rehab Potential Good   Clinical Impairments Affecting Rehab Potential chemotherapy induced peripheral neuropathy   PT Frequency 2x / week   PT Duration 8 weeks   PT Treatment/Interventions Electrical Stimulation;Moist Heat;Therapeutic exercise;Therapeutic activities;Balance training;Patient/family education;Neuromuscular re-education;Manual lymph drainage   PT Next Visit Plan Resume/Continue with balance/proprioception activities and issue HEP handouts. Lt breast manual lymph drainage prn per renewal for review for pts technique as she has questions.   Consulted and Agree with Plan of Care Patient        Problem List Patient Active Problem List   Diagnosis Date Noted  . Shingles 05/15/2014  . Depressed 05/15/2014  . Anxiety 05/15/2014  . Breast pain 05/15/2014  . Rotator cuff tear 02/24/2014  . Hot flashes due to tamoxifen 02/20/2014  . Left shoulder pain with abduction 12/12/2013  . Unspecified hereditary and idiopathic peripheral neuropathy 11/11/2013  . CVA (cerebral infarction) 07/18/2013  . TIA (transient ischemic attack) 07/18/2013  . Contracture of axilla 05/20/2013  . Primary cancer of upper inner quadrant of left female breast 01/24/2013  . Anal fissure 05/03/2011  .  Hemorrhoids, internal, with prolapse 05/03/2011    Otelia Limes, PTA 11/04/2014, 11:55 AM  [place], [name], [address] Phone: [phone]   Fax:  [phone]

## 2014-11-06 LAB — HM COLONOSCOPY

## 2014-11-09 ENCOUNTER — Ambulatory Visit: Payer: BC Managed Care – PPO | Attending: Orthopedic Surgery

## 2014-11-09 DIAGNOSIS — G622 Polyneuropathy due to other toxic agents: Secondary | ICD-10-CM | POA: Diagnosis not present

## 2014-11-09 DIAGNOSIS — G620 Drug-induced polyneuropathy: Secondary | ICD-10-CM

## 2014-11-09 DIAGNOSIS — I890 Lymphedema, not elsewhere classified: Secondary | ICD-10-CM | POA: Insufficient documentation

## 2014-11-09 DIAGNOSIS — R269 Unspecified abnormalities of gait and mobility: Secondary | ICD-10-CM | POA: Diagnosis present

## 2014-11-09 DIAGNOSIS — R29818 Other symptoms and signs involving the nervous system: Secondary | ICD-10-CM | POA: Insufficient documentation

## 2014-11-09 DIAGNOSIS — T451X5A Adverse effect of antineoplastic and immunosuppressive drugs, initial encounter: Secondary | ICD-10-CM

## 2014-11-09 DIAGNOSIS — R2689 Other abnormalities of gait and mobility: Secondary | ICD-10-CM

## 2014-11-09 NOTE — Therapy (Signed)
[place], [name], [address] Phone: [phone]   Fax:  [phone]  Physical Therapy Treatment  Patient Details  Name: Kristi [name] MRN: [id number] Date of Birth: 1956/09/06  Encounter Date: 11/09/2014      PT End of Session - 11/09/14 1152    Visit Number 12   Number of Visits 16   Date for PT Re-Evaluation 12/18/14   PT Start Time 79   PT Stop Time 1147   PT Time Calculation (min) 45 min      Past Medical History  Diagnosis Date  . Anxiety   . Hypertension   . MRSA (methicillin resistant Staphylococcus aureus) 2009    right groin area-no issues now. 04-07-14 PCR screen negative today.  . Depression   . Anemia     Iron deficinecy anemia  . History of radiation therapy 09/09/13-10/28/13    45 gray to left breast, lumpectomy cavity boosted to 63 gray  . Neuropathy   . Arthritis   . Breast cancer     left ,last radiation 2'15, last chemo 8'14    Past Surgical History  Procedure Laterality Date  . Anal sphincterotomy  04/2011  . Hemorrhoid surgery  04/2011    ligation  . Breast lumpectomy with needle localization Left 02/04/2013    Procedure: LEFT BREAST LUMPECTOMY WITH NEEDLE LOCALIZATION;  Surgeon: Stark Klein, MD;  Location: [facility];  Service: General;  Laterality: Left;  . Axillary lymph node dissection Left 02/04/2013    Procedure: LEFT AXILLARY LYMPH NODE DISSECTION;  Surgeon: Stark Klein, MD;  Location: [facility];  Service: General;  Laterality: Left;  End: 2671  . Portacath placement Right 02/04/2013    Procedure: INSERTION PORT-A-CATH;  Surgeon: Stark Klein, MD;  Location: [facility];  Service: General;  Laterality: Right;  Start Time: 2458.  Marland Kitchen Appendectomy  1980  . Breast surgery      Lumpectomy in april 2014  . Shoulder arthroscopy with rotator cuff repair and subacromial decompression Left 02/24/2014    Procedure: SHOULDER ARTHROSCOPY WITH ROTATOR CUFF REPAIR AND SUBACROMIAL DECOMPRESSION;   Surgeon: Meredith Pel, MD;  Location: [facility];  Service: Orthopedics;  Laterality: Left;  LEFT SHOULDER DIAGNOSTIC OPERATIVE ARTHROSCOPY, SUBACROMIAL DECOMPRESSION, ROTATOR CUFF TEAR REPAIR  . Port-a-cath removal N/A 04/16/2014    Procedure: REMOVAL PORT-A-CATH;  Surgeon: Stark Klein, MD;  [facility];  Service: General;  Laterality: N/A;    LMP 01/20/2013  Visit Diagnosis:  Chemotherapy-induced peripheral neuropathy  Balance problems  Functional gait disorder  Lymphedema of breast      Subjective Assessment - 11/09/14 1106    Symptoms The drive to [place] went well and the house I stayed at had 3 levels and I did well with the stairs, no flare up of my foot pain! And feel pretty good today. Also feel like I'm doing the breast massage better so the fluid in my breast has actually been pretty good lately.                    [facility] Adult PT Treatment/Exercise - 11/09/14 0001    Knee/Hip Exercises: Aerobic   Stationary Bike Level 2 x8 mins   Knee/Hip Exercises: Machines for Strengthening   Cybex Leg Press 60 lbs 2 sets of 10 reps   Knee/Hip Exercises: Standing   Walking with Sports Cord 4 ways with 3 plates backwards and 2 plates all other directions, 6 reps each. PTA provided CGA for all 4  directions today, pt able to perform with less LOB.    Balance Exercises   March on Foam/Wedge 10 reps  2 sets in // bars on [employer] , cuing for slow pace   Gait with Head Turns (Round Trips) Retrotandem walking in // bars looking Lt/Rt and then Up/Down 2 round trips each.   Balance Exercises: Standing   Tandem Stance Eyes closed;Foam;1 rep;30 secs;Intermittent HHA  FitterBoard, 30 sec bil LE lead                   Short Term Clinic Goals - 09/25/14 1308    CC Short Term Goal  #1   Title short term goals= long term goals   Time --  4   Period Weeks   Status New             Long Term Clinic Goals - 11/04/14 1103    CC Long Term Goal  #3   Title  Patient will report 50 percent decrease in feet discomfort symptoms  60% improvement reported.   Status Achieved            Plan - 11/09/14 1152    Clinical Impression Statement Improvement already noted today from last weeks high balance activities. Though were still challenging, pt demonstrated less LOB throughout todays treatment.   Pt will benefit from skilled therapeutic intervention in order to improve on the following deficits Decreased balance;Decreased activity tolerance;Abnormal gait;Increased muscle spasms;Increased edema   Rehab Potential Good   Clinical Impairments Affecting Rehab Potential chemotherapy induced peripheral neuropathy   PT Frequency 2x / week   PT Duration 8 weeks   PT Treatment/Interventions Electrical Stimulation;Moist Heat;Therapeutic exercise;Therapeutic activities;Balance training;Patient/family education;Neuromuscular re-education;Manual lymph drainage   PT Next Visit Plan Continue with balance/proprioception activities and issue HEP handouts. Lt breast manual lymph drainage prn per renewal for review for pts technique as she has questions. Assess goals.        Problem List Patient Active Problem List   Diagnosis Date Noted  . Shingles 05/15/2014  . Depressed 05/15/2014  . Anxiety 05/15/2014  . Breast pain 05/15/2014  . Rotator cuff tear 02/24/2014  . Hot flashes due to tamoxifen 02/20/2014  . Left shoulder pain with abduction 12/12/2013  . Unspecified hereditary and idiopathic peripheral neuropathy 11/11/2013  . CVA (cerebral infarction) 07/18/2013  . TIA (transient ischemic attack) 07/18/2013  . Contracture of axilla 05/20/2013  . Primary cancer of upper inner quadrant of left female breast 01/24/2013  . Anal fissure 05/03/2011  . Hemorrhoids, internal, with prolapse 05/03/2011    Otelia Limes, PTA 11/09/2014, 11:57 AM  [place], [name],  [address] Phone: [phone]   Fax:  [phone]

## 2014-11-12 ENCOUNTER — Ambulatory Visit: Payer: BC Managed Care – PPO

## 2014-11-12 DIAGNOSIS — R2689 Other abnormalities of gait and mobility: Secondary | ICD-10-CM

## 2014-11-12 DIAGNOSIS — T451X5A Adverse effect of antineoplastic and immunosuppressive drugs, initial encounter: Secondary | ICD-10-CM

## 2014-11-12 DIAGNOSIS — R269 Unspecified abnormalities of gait and mobility: Secondary | ICD-10-CM

## 2014-11-12 DIAGNOSIS — G620 Drug-induced polyneuropathy: Secondary | ICD-10-CM

## 2014-11-12 DIAGNOSIS — G622 Polyneuropathy due to other toxic agents: Secondary | ICD-10-CM | POA: Diagnosis not present

## 2014-11-12 DIAGNOSIS — I890 Lymphedema, not elsewhere classified: Secondary | ICD-10-CM

## 2014-11-12 NOTE — Therapy (Signed)
[place], [name], [address] Phone: [phone]   Fax:  [phone]  Physical Therapy Treatment  Patient Details  Name: Kristi [name] MRN: [id number] Date of Birth: 1956-02-24 Referring Provider:  Ron Parker, MD  Encounter Date: 11/12/2014      PT End of Session - 11/12/14 1610    Visit Number 13   Number of Visits 16   Date for PT Re-Evaluation 12/18/14   PT Start Time 6269   PT Stop Time 1431   PT Time Calculation (min) 43 min      Past Medical History  Diagnosis Date  . Anxiety   . Hypertension   . MRSA (methicillin resistant Staphylococcus aureus) 2009    right groin area-no issues now. 04-07-14 PCR screen negative today.  . Depression   . Anemia     Iron deficinecy anemia  . History of radiation therapy 09/09/13-10/28/13    45 gray to left breast, lumpectomy cavity boosted to 63 gray  . Neuropathy   . Arthritis   . Breast cancer     left ,last radiation 2'15, last chemo 8'14    Past Surgical History  Procedure Laterality Date  . Anal sphincterotomy  04/2011  . Hemorrhoid surgery  04/2011    ligation  . Breast lumpectomy with needle localization Left 02/04/2013    Procedure: LEFT BREAST LUMPECTOMY WITH NEEDLE LOCALIZATION;  Surgeon: Stark Klein, MD;  Location: [facility];  Service: General;  Laterality: Left;  . Axillary lymph node dissection Left 02/04/2013    Procedure: LEFT AXILLARY LYMPH NODE DISSECTION;  Surgeon: Stark Klein, MD;  Location: [facility];  Service: General;  Laterality: Left;  End: 4854  . Portacath placement Right 02/04/2013    Procedure: INSERTION PORT-A-CATH;  Surgeon: Stark Klein, MD;  Location: [facility];  Service: General;  Laterality: Right;  Start Time: 6270.  Marland Kitchen Appendectomy  1980  . Breast surgery      Lumpectomy in april 2014  . Shoulder arthroscopy with rotator cuff repair and subacromial decompression Left 02/24/2014    Procedure: SHOULDER ARTHROSCOPY WITH ROTATOR  CUFF REPAIR AND SUBACROMIAL DECOMPRESSION;  Surgeon: Meredith Pel, MD;  Location: [facility];  Service: Orthopedics;  Laterality: Left;  LEFT SHOULDER DIAGNOSTIC OPERATIVE ARTHROSCOPY, SUBACROMIAL DECOMPRESSION, ROTATOR CUFF TEAR REPAIR  . Port-a-cath removal N/A 04/16/2014    Procedure: REMOVAL PORT-A-CATH;  Surgeon: Stark Klein, MD;  [facility]: [facility];  Service: General;  Laterality: N/A;    LMP 01/20/2013  Visit Diagnosis:  Chemotherapy-induced peripheral neuropathy  Balance problems  Functional gait disorder  Lymphedema of breast      Subjective Assessment - 11/12/14 1404    Symptoms Saw the docotr at the pain clinic and she is going to start me on a natural supplement to help me sleep, but havent started it yet. Had a flare up of pain last night after prolonged walking yesterday but thats the first flare up I've had in over a week!                    [facility] Adult PT Treatment/Exercise - 11/12/14 0001    Knee/Hip Exercises: Aerobic   Stationary Bike Level 2 x8 mins   Knee/Hip Exercises: Machines for Strengthening   Cybex Leg Press 45 lbs 2 sets of 15 reps   Knee/Hip Exercises: Standing   Walking with Sports Cord 4 ways with 4 plates backwards with just S ; 3 plates all other directions, 5 reps each.  PTA provided min-mod A for all bil sidestepping., CGA for front.    Balance Exercises   March on Foam/Wedge 20 reps  On Fitter Board at [place]: Standing   Tandem Stance Eyes open;Foam;30 secs;2 reps;Eyes closed;Intermittent HHA  1 set eyes open, then 1 set eyes closed                   Short Term Clinic Goals - 09/25/14 1308    CC Short Term Goal  #1   Title short term goals= long term goals   Time --  4   Period Weeks   Status New             Long Term Clinic Goals - 11/12/14 1614    CC Long Term Goal  #1   Title Patient will improve dynamic gait index score to 16   Status On-going   CC Long Term Goal  #4   Title  pt will report 50% decrease in breast lymphedema symptoms.   Status On-going            Plan - 11/12/14 1611    Clinical Impression Statement Pt had some mod LOB with increase weight added to cable walking today, but tolerated the activity well. Her flare ups of bil foot pain are less frequent now. Pt does still struggle with higher level balance deficits.   Pt will benefit from skilled therapeutic intervention in order to improve on the following deficits Decreased balance;Decreased activity tolerance;Abnormal gait;Increased muscle spasms;Increased edema   Rehab Potential Good   Clinical Impairments Affecting Rehab Potential chemotherapy induced peripheral neuropathy   PT Frequency 2x / week   PT Duration 8 weeks   PT Treatment/Interventions Electrical Stimulation;Moist Heat;Therapeutic exercise;Therapeutic activities;Balance training;Patient/family education;Neuromuscular re-education;Manual lymph drainage   PT Next Visit Plan Retest Dynamic Gait Index for goal assess. Continue with balance/proprioception activities and issue HEP handouts. Lt breast manual lymph drainage prn per renewal for review for pts technique as she has questions.        Problem List Patient Active Problem List   Diagnosis Date Noted  . Shingles 05/15/2014  . Depressed 05/15/2014  . Anxiety 05/15/2014  . Breast pain 05/15/2014  . Rotator cuff tear 02/24/2014  . Hot flashes due to tamoxifen 02/20/2014  . Left shoulder pain with abduction 12/12/2013  . Unspecified hereditary and idiopathic peripheral neuropathy 11/11/2013  . CVA (cerebral infarction) 07/18/2013  . TIA (transient ischemic attack) 07/18/2013  . Contracture of axilla 05/20/2013  . Primary cancer of upper inner quadrant of left female breast 01/24/2013  . Anal fissure 05/03/2011  . Hemorrhoids, internal, with prolapse 05/03/2011    Otelia Limes, PTA 11/12/2014, 4:17 PM  [place], [name], [address] Phone: [phone]   Fax:  [phone]

## 2014-11-16 ENCOUNTER — Ambulatory Visit: Payer: BC Managed Care – PPO

## 2014-11-16 DIAGNOSIS — T451X5A Adverse effect of antineoplastic and immunosuppressive drugs, initial encounter: Secondary | ICD-10-CM

## 2014-11-16 DIAGNOSIS — G622 Polyneuropathy due to other toxic agents: Secondary | ICD-10-CM | POA: Diagnosis not present

## 2014-11-16 DIAGNOSIS — G620 Drug-induced polyneuropathy: Secondary | ICD-10-CM

## 2014-11-16 DIAGNOSIS — R269 Unspecified abnormalities of gait and mobility: Secondary | ICD-10-CM

## 2014-11-16 DIAGNOSIS — I890 Lymphedema, not elsewhere classified: Secondary | ICD-10-CM

## 2014-11-16 DIAGNOSIS — R2689 Other abnormalities of gait and mobility: Secondary | ICD-10-CM

## 2014-11-16 NOTE — Therapy (Signed)
[place], [name], [address] Phone: [phone]   Fax:  [phone]  Physical Therapy Treatment  Patient Details  Name: Kristi [name] MRN: [id number] Date of Birth: 1956/10/25 Referring Provider:  Ron Parker, MD  Encounter Date: 11/16/2014      PT End of Session - 11/16/14 1016    Visit Number 14   Number of Visits 16   Date for PT Re-Evaluation 12/18/14   PT Start Time 0935   PT Stop Time 1018   PT Time Calculation (min) 43 min      Past Medical History  Diagnosis Date  . Anxiety   . Hypertension   . MRSA (methicillin resistant Staphylococcus aureus) 2009    right groin area-no issues now. 04-07-14 PCR screen negative today.  . Depression   . Anemia     Iron deficinecy anemia  . History of radiation therapy 09/09/13-10/28/13    45 gray to left breast, lumpectomy cavity boosted to 63 gray  . Neuropathy   . Arthritis   . Breast cancer     left ,last radiation 2'15, last chemo 8'14    Past Surgical History  Procedure Laterality Date  . Anal sphincterotomy  04/2011  . Hemorrhoid surgery  04/2011    ligation  . Breast lumpectomy with needle localization Left 02/04/2013    Procedure: LEFT BREAST LUMPECTOMY WITH NEEDLE LOCALIZATION;  Surgeon: Stark Klein, MD;  Location: [facility];  Service: General;  Laterality: Left;  . Axillary lymph node dissection Left 02/04/2013    Procedure: LEFT AXILLARY LYMPH NODE DISSECTION;  Surgeon: Stark Klein, MD;  Location: [facility];  Service: General;  Laterality: Left;  End: 5400  . Portacath placement Right 02/04/2013    Procedure: INSERTION PORT-A-CATH;  Surgeon: Stark Klein, MD;  Location: [facility];  Service: General;  Laterality: Right;  Start Time: 8676.  Marland Kitchen Appendectomy  1980  . Breast surgery      Lumpectomy in april 2014  . Shoulder arthroscopy with rotator cuff repair and subacromial decompression Left 02/24/2014    Procedure: SHOULDER ARTHROSCOPY WITH ROTATOR  CUFF REPAIR AND SUBACROMIAL DECOMPRESSION;  Surgeon: Meredith Pel, MD;  Location: [facility];  Service: Orthopedics;  Laterality: Left;  LEFT SHOULDER DIAGNOSTIC OPERATIVE ARTHROSCOPY, SUBACROMIAL DECOMPRESSION, ROTATOR CUFF TEAR REPAIR  . Port-a-cath removal N/A 04/16/2014    Procedure: REMOVAL PORT-A-CATH;  Surgeon: Stark Klein, MD;  [facility];  Service: General;  Laterality: N/A;    LMP 01/20/2013  Visit Diagnosis:  Chemotherapy-induced peripheral neuropathy  Balance problems  Functional gait disorder  Lymphedema of breast      Subjective Assessment - 11/16/14 0937    Symptoms Friday night my Lt foot flared up, maybe the sock I had on in my shoe was too thick. So I took a pain pill that night and it felt better Saturday. Feel good right now.                    [facility] Adult PT Treatment/Exercise - 11/16/14 0001    Knee/Hip Exercises: Aerobic   Stationary Bike Level 2 x8 mins   Knee/Hip Exercises: Machines for Strengthening   Cybex Leg Press 60 lbs 3 sets of 10 reps   Knee/Hip Exercises: Standing   Walking with Sports Cord 4 ways with 4 plates backwards with just S ; 3 plates all other directions, 5 reps each. CGA for remaining 3 directions.    Gait Training In // bars: [place] walking  Rt and Lt 1 trip each   Balance Exercises   Tandem Walking 2 round trips  Retro with eyes closed, intermittent HHA in // bars   March on Foam/Wedge 10 reps  3 sec holds in // bars on [employer] Exercises: Standing   Heel Raises Both;15 reps  with toe raise, limited ROM with heel raise d/t weak ankle      Heel/toe raises also on Fitter Board in //bars             Short Term Clinic Goals - 09/25/14 1308    CC Short Term Goal  #1   Title short term goals= long term goals   Time --  4   Period Weeks   Status New             Long Term Clinic Goals - 11/12/14 1614    CC Long Term Goal  #1   Title Patient will improve dynamic gait index  score to 16   Status On-going   CC Long Term Goal  #4   Title pt will report 50% decrease in breast lymphedema symptoms.   Status On-going            Plan - 11/16/14 1017    Clinical Impression Statement Pt had no LOB with cord walking today, did very well with this. And overall continued improvements with balance activities.   Pt will benefit from skilled therapeutic intervention in order to improve on the following deficits Decreased balance;Decreased activity tolerance;Abnormal gait;Increased muscle spasms;Increased edema   Rehab Potential Good   Clinical Impairments Affecting Rehab Potential chemotherapy induced peripheral neuropathy   PT Frequency 2x / week   PT Duration 8 weeks   PT Treatment/Interventions Electrical Stimulation;Moist Heat;Therapeutic exercise;Therapeutic activities;Balance training;Patient/family education;Neuromuscular re-education;Manual lymph drainage   PT Next Visit Plan Retest Dynamic Gait Index for goal assess. Continue with balance/proprioception activities and issue HEP handouts. Lt breast manual lymph drainage prn per renewal for review for pts technique as she has questions.        Problem List Patient Active Problem List   Diagnosis Date Noted  . Shingles 05/15/2014  . Depressed 05/15/2014  . Anxiety 05/15/2014  . Breast pain 05/15/2014  . Rotator cuff tear 02/24/2014  . Hot flashes due to tamoxifen 02/20/2014  . Left shoulder pain with abduction 12/12/2013  . Unspecified hereditary and idiopathic peripheral neuropathy 11/11/2013  . CVA (cerebral infarction) 07/18/2013  . TIA (transient ischemic attack) 07/18/2013  . Contracture of axilla 05/20/2013  . Primary cancer of upper inner quadrant of left female breast 01/24/2013  . Anal fissure 05/03/2011  . Hemorrhoids, internal, with prolapse 05/03/2011    Otelia Limes, PTA 11/16/2014, 10:21 AM  [place], [name], [address] Phone: [phone]   Fax:  [phone]

## 2014-11-19 ENCOUNTER — Ambulatory Visit: Payer: BC Managed Care – PPO

## 2014-11-19 DIAGNOSIS — R2689 Other abnormalities of gait and mobility: Secondary | ICD-10-CM

## 2014-11-19 DIAGNOSIS — T451X5A Adverse effect of antineoplastic and immunosuppressive drugs, initial encounter: Secondary | ICD-10-CM

## 2014-11-19 DIAGNOSIS — I890 Lymphedema, not elsewhere classified: Secondary | ICD-10-CM

## 2014-11-19 DIAGNOSIS — G622 Polyneuropathy due to other toxic agents: Secondary | ICD-10-CM | POA: Diagnosis not present

## 2014-11-19 DIAGNOSIS — R269 Unspecified abnormalities of gait and mobility: Secondary | ICD-10-CM

## 2014-11-19 DIAGNOSIS — G620 Drug-induced polyneuropathy: Secondary | ICD-10-CM

## 2014-11-19 NOTE — Therapy (Signed)
[place], [name], [address] Phone: [phone]   Fax:  [phone]  Physical Therapy Treatment  Patient Details  Name: Kristi [name] MRN: [id number] Date of Birth: February 03, 1956 Referring Provider:  Ron Parker, MD  Encounter Date: 11/19/2014      PT End of Session - 11/19/14 1359    Visit Number 15   Number of Visits 16   Date for PT Re-Evaluation 12/18/14   PT Start Time 5638   PT Stop Time 1351   PT Time Calculation (min) 49 min      Past Medical History  Diagnosis Date  . Anxiety   . Hypertension   . MRSA (methicillin resistant Staphylococcus aureus) 2009    right groin area-no issues now. 04-07-14 PCR screen negative today.  . Depression   . Anemia     Iron deficinecy anemia  . History of radiation therapy 09/09/13-10/28/13    45 gray to left breast, lumpectomy cavity boosted to 63 gray  . Neuropathy   . Arthritis   . Breast cancer     left ,last radiation 2'15, last chemo 8'14    Past Surgical History  Procedure Laterality Date  . Anal sphincterotomy  04/2011  . Hemorrhoid surgery  04/2011    ligation  . Breast lumpectomy with needle localization Left 02/04/2013    Procedure: LEFT BREAST LUMPECTOMY WITH NEEDLE LOCALIZATION;  Surgeon: Stark Klein, MD;  Location: [facility];  Service: General;  Laterality: Left;  . Axillary lymph node dissection Left 02/04/2013    Procedure: LEFT AXILLARY LYMPH NODE DISSECTION;  Surgeon: Stark Klein, MD;  Location: [facility];  Service: General;  Laterality: Left;  End: 9373  . Portacath placement Right 02/04/2013    Procedure: INSERTION PORT-A-CATH;  Surgeon: Stark Klein, MD;  Location: [facility];  Service: General;  Laterality: Right;  Start Time: 4287.  Marland Kitchen Appendectomy  1980  . Breast surgery      Lumpectomy in april 2014  . Shoulder arthroscopy with rotator cuff repair and subacromial decompression Left 02/24/2014    Procedure: SHOULDER ARTHROSCOPY WITH ROTATOR  CUFF REPAIR AND SUBACROMIAL DECOMPRESSION;  Surgeon: Meredith Pel, MD;  Location: [facility];  Service: Orthopedics;  Laterality: Left;  LEFT SHOULDER DIAGNOSTIC OPERATIVE ARTHROSCOPY, SUBACROMIAL DECOMPRESSION, ROTATOR CUFF TEAR REPAIR  . Port-a-cath removal N/A 04/16/2014    Procedure: REMOVAL PORT-A-CATH;  Surgeon: Stark Klein, MD;  [facility];  Service: General;  Laterality: N/A;    LMP 01/20/2013  Visit Diagnosis:  Chemotherapy-induced peripheral neuropathy  Balance problems  Functional gait disorder  Lymphedema of breast      Subjective Assessment - 11/19/14 1309    Symptoms Feel like the stretch marks on my Lt inferior breast are more pronounced so the fluid must be incresaed, want to try some foam in my bra again. Would also like to keep coming for my balance.          [facility] PT Assessment - 11/19/14 0001    Dynamic Gait Index   Level Surface Normal   Change in Gait Speed Mild Impairment   Gait with Horizontal Head Turns Moderate Impairment   Gait with Vertical Head Turns Mild Impairment   Gait and Pivot Turn Mild Impairment   Step Over Obstacle Normal   Step Around Obstacles Normal   Steps Normal   Total Score 19                  [facility] Adult PT Treatment/Exercise -  11/19/14 0001    Knee/Hip Exercises: Aerobic   Stationary Bike Level 2 x8 mins   Knee/Hip Exercises: Machines for Strengthening   Cybex Leg Press 60 lbs 3 sets of 10 reps   Manual Therapy   Manual Lymphatic Drainage (MLD) Reviewed in supine manual lymph drainage of Lt breast: Rt axilla and Lt inguinal nodes, anterior inter-axillary and Lt axillo-inguinal anastomosis and Lt breast. Pt needed max cuing for correct stretch of skin and less pressure.                PT Education - 11/19/14 1353    Education provided Yes   Education Details Reviewed manual lymph drainage of Lt breast   Person(s) Educated Patient   Methods Explanation;Demonstration;Verbal cues;Tactile cues    Comprehension Verbalized understanding;Returned demonstration;Need further instruction;Tactile cues required           Short Term Clinic Goals - 09/25/14 1308    CC Short Term Goal  #1   Title short term goals= long term goals   Time --  4   Period Weeks   Status New             Long Term Clinic Goals - 11/19/14 1402    CC Long Term Goal  #1   Title Patient will improve dynamic gait index score to 16   Status Achieved   CC Long Term Goal  #4   Title pt will report 50% decrease in breast lymphedema symptoms.   Status On-going            Plan - 11/19/14 1359    Clinical Impression Statement Pt had improvements with dynamic gait index retested today and demonstrated good understanding of proper technique of manual lymph drainage though needed moderate tactile and verbal cuing to get it.    Pt will benefit from skilled therapeutic intervention in order to improve on the following deficits Decreased balance;Decreased activity tolerance;Abnormal gait;Increased muscle spasms;Increased edema   Rehab Potential Good   Clinical Impairments Affecting Rehab Potential chemotherapy induced peripheral neuropathy   PT Treatment/Interventions Electrical Stimulation;Moist Heat;Therapeutic exercise;Therapeutic activities;Balance training;Patient/family education;Neuromuscular re-education;Manual lymph drainage   PT Next Visit Plan Renewal required next visit. Review manual lymph drainage and make small chip pack for wear in bra prn. Cont balance activities.   Consulted and Agree with Plan of Care Patient        Problem List Patient Active Problem List   Diagnosis Date Noted  . Shingles 05/15/2014  . Depressed 05/15/2014  . Anxiety 05/15/2014  . Breast pain 05/15/2014  . Rotator cuff tear 02/24/2014  . Hot flashes due to tamoxifen 02/20/2014  . Left shoulder pain with abduction 12/12/2013  . Unspecified hereditary and idiopathic peripheral neuropathy 11/11/2013  . CVA (cerebral  infarction) 07/18/2013  . TIA (transient ischemic attack) 07/18/2013  . Contracture of axilla 05/20/2013  . Primary cancer of upper inner quadrant of left female breast 01/24/2013  . Anal fissure 05/03/2011  . Hemorrhoids, internal, with prolapse 05/03/2011    Otelia Limes, PTA 11/19/2014, 2:04 PM  [place], [name], [address] Phone: [phone]   Fax:  [phone]

## 2014-11-24 ENCOUNTER — Ambulatory Visit: Payer: BC Managed Care – PPO

## 2014-11-24 DIAGNOSIS — T451X5A Adverse effect of antineoplastic and immunosuppressive drugs, initial encounter: Secondary | ICD-10-CM

## 2014-11-24 DIAGNOSIS — I890 Lymphedema, not elsewhere classified: Secondary | ICD-10-CM

## 2014-11-24 DIAGNOSIS — R2689 Other abnormalities of gait and mobility: Secondary | ICD-10-CM

## 2014-11-24 DIAGNOSIS — R269 Unspecified abnormalities of gait and mobility: Secondary | ICD-10-CM

## 2014-11-24 DIAGNOSIS — G620 Drug-induced polyneuropathy: Secondary | ICD-10-CM

## 2014-11-26 ENCOUNTER — Ambulatory Visit: Payer: BC Managed Care – PPO

## 2014-11-26 DIAGNOSIS — G620 Drug-induced polyneuropathy: Secondary | ICD-10-CM

## 2014-11-26 DIAGNOSIS — I890 Lymphedema, not elsewhere classified: Secondary | ICD-10-CM

## 2014-11-26 DIAGNOSIS — R269 Unspecified abnormalities of gait and mobility: Secondary | ICD-10-CM

## 2014-11-26 DIAGNOSIS — R2689 Other abnormalities of gait and mobility: Secondary | ICD-10-CM

## 2014-11-26 DIAGNOSIS — G622 Polyneuropathy due to other toxic agents: Secondary | ICD-10-CM | POA: Diagnosis not present

## 2014-11-26 DIAGNOSIS — T451X5A Adverse effect of antineoplastic and immunosuppressive drugs, initial encounter: Secondary | ICD-10-CM

## 2014-11-26 NOTE — Therapy (Signed)
[place], [name], [address] Phone: [phone]   Fax:  [phone]  Physical Therapy Treatment  Patient Details  Name: Kristi [name] MRN: [id number] Date of Birth: Mar 28, 1956 Referring Provider:  Ron Parker, MD  Encounter Date: 11/26/2014      PT End of Session - 11/26/14 1349    Visit Number 17   Date for PT Re-Evaluation 12/18/14   PT Start Time 1302   PT Stop Time 1345   PT Time Calculation (min) 43 min      Past Medical History  Diagnosis Date  . Anxiety   . Hypertension   . MRSA (methicillin resistant Staphylococcus aureus) 2009    right groin area-no issues now. 04-07-14 PCR screen negative today.  . Depression   . Anemia     Iron deficinecy anemia  . History of radiation therapy 09/09/13-10/28/13    45 gray to left breast, lumpectomy cavity boosted to 63 gray  . Neuropathy   . Arthritis   . Breast cancer     left ,last radiation 2'15, last chemo 8'14    Past Surgical History  Procedure Laterality Date  . Anal sphincterotomy  04/2011  . Hemorrhoid surgery  04/2011    ligation  . Breast lumpectomy with needle localization Left 02/04/2013    Procedure: LEFT BREAST LUMPECTOMY WITH NEEDLE LOCALIZATION;  Surgeon: Stark Klein, MD;  Location: [facility];  Service: General;  Laterality: Left;  . Axillary lymph node dissection Left 02/04/2013    Procedure: LEFT AXILLARY LYMPH NODE DISSECTION;  Surgeon: Stark Klein, MD;  Location: [facility];  Service: General;  Laterality: Left;  End: 6967  . Portacath placement Right 02/04/2013    Procedure: INSERTION PORT-A-CATH;  Surgeon: Stark Klein, MD;  Location: [facility];  Service: General;  Laterality: Right;  Start Time: 8938.  Marland Kitchen Appendectomy  1980  . Breast surgery      Lumpectomy in april 2014  . Shoulder arthroscopy with rotator cuff repair and subacromial decompression Left 02/24/2014    Procedure: SHOULDER ARTHROSCOPY WITH ROTATOR CUFF REPAIR AND  SUBACROMIAL DECOMPRESSION;  Surgeon: Meredith Pel, MD;  Location: [facility];  Service: Orthopedics;  Laterality: Left;  LEFT SHOULDER DIAGNOSTIC OPERATIVE ARTHROSCOPY, SUBACROMIAL DECOMPRESSION, ROTATOR CUFF TEAR REPAIR  . Port-a-cath removal N/A 04/16/2014    Procedure: REMOVAL PORT-A-CATH;  Surgeon: Stark Klein, MD;  [facility];  Service: General;  Laterality: N/A;    LMP 01/20/2013  Visit Diagnosis:  Chemotherapy-induced peripheral neuropathy  Balance problems  Functional gait disorder  Lymphedema of breast      Subjective Assessment - 11/26/14 1307    Symptoms "My Lt breast swelling was much better after last treatment, I have been trying to work on it the way you did." Want to work on my balance again today. Wearing my diabetic insoles, just going to wear them for about 2 hours today and see if I have a flare up from that.                    [facility] Adult PT Treatment/Exercise - 11/26/14 0001    Knee/Hip Exercises: Aerobic   Stationary Bike Level 2 x8 mins   Knee/Hip Exercises: Machines for Strengthening   Cybex Leg Press 60 lbs 3 sets of 10 reps   Knee/Hip Exercises: Standing   Walking with Sports Cord 4 ways with 4 plates backwards and front with just S ; 3 plates bil sidestepping with CGA, 5 reps each.  Balance Exercises   Tandem Walking 2 round trips  With Lt/Rt head turns, moderately challenging   March on Foam/Wedge 10 reps  3-5 second holds/ SoftBoard   Gait with Head Turns (Round Trips) In // bars for slow marching, looking Lt/Rt 2 round trips with intermittent HHA   Gait on Ramp/Curb On ramp at edge of playmat: Bil crossover Lt and Rt with CGA 1 set of 25 ft. and heel/toe 1 round trip with Lt/Rt                   Short Term Clinic Goals - 09/25/14 1308    CC Short Term Goal  #1   Title short term goals= long term goals   Time --  4   Period Weeks   Status New             Long Term Clinic Goals - 11/19/14 1402    CC  Long Term Goal  #1   Title Patient will improve dynamic gait index score to 16   Status Achieved   CC Long Term Goal  #4   Title pt will report 50% decrease in breast lymphedema symptoms.   Status On-going            Plan - 11/26/14 1349    Clinical Impression Statement Pt continues to show steady improvement with high level balance activity and with less LOB throughout.   Pt will benefit from skilled therapeutic intervention in order to improve on the following deficits Decreased balance;Decreased activity tolerance;Abnormal gait;Increased muscle spasms;Increased edema   Rehab Potential Good   Clinical Impairments Affecting Rehab Potential chemotherapy induced peripheral neuropathy   PT Frequency 2x / week   PT Duration 8 weeks   PT Treatment/Interventions Electrical Stimulation;Moist Heat;Therapeutic exercise;Therapeutic activities;Balance training;Patient/family education;Neuromuscular re-education;Manual lymph drainage   PT Next Visit Plan Renewal required next visit. Review manual lymph drainage and make small chip pack for wear in bra prn. Cont balance activities.        Problem List Patient Active Problem List   Diagnosis Date Noted  . Shingles 05/15/2014  . Depressed 05/15/2014  . Anxiety 05/15/2014  . Breast pain 05/15/2014  . Rotator cuff tear 02/24/2014  . Hot flashes due to tamoxifen 02/20/2014  . Left shoulder pain with abduction 12/12/2013  . Unspecified hereditary and idiopathic peripheral neuropathy 11/11/2013  . CVA (cerebral infarction) 07/18/2013  . TIA (transient ischemic attack) 07/18/2013  . Contracture of axilla 05/20/2013  . Primary cancer of upper inner quadrant of left female breast 01/24/2013  . Anal fissure 05/03/2011  . Hemorrhoids, internal, with prolapse 05/03/2011    Otelia Limes, PTA 11/26/2014, 1:52 PM  [place], [name], [address] Phone:  [phone]   Fax:  [phone]

## 2014-12-01 ENCOUNTER — Ambulatory Visit: Payer: BC Managed Care – PPO | Admitting: Physical Therapy

## 2014-12-01 DIAGNOSIS — R269 Unspecified abnormalities of gait and mobility: Secondary | ICD-10-CM

## 2014-12-01 DIAGNOSIS — R2689 Other abnormalities of gait and mobility: Secondary | ICD-10-CM

## 2014-12-01 DIAGNOSIS — G622 Polyneuropathy due to other toxic agents: Secondary | ICD-10-CM | POA: Diagnosis not present

## 2014-12-01 DIAGNOSIS — I890 Lymphedema, not elsewhere classified: Secondary | ICD-10-CM

## 2014-12-01 NOTE — Therapy (Signed)
[place], [name], [address] Phone: [phone]   Fax:  [phone]  Physical Therapy Treatment  Patient Details  Name: Kristi [name] MRN: [id number] Date of Birth: 10-24-1956 Referring Provider:  Ron Parker, MD  Encounter Date: 12/01/2014      PT End of Session - 12/01/14 1555    Visit Number 18   Number of Visits 28   Date for PT Re-Evaluation 01/01/15   PT Start Time 3762   PT Stop Time 1430   PT Time Calculation (min) 45 min      Past Medical History  Diagnosis Date  . Anxiety   . Hypertension   . MRSA (methicillin resistant Staphylococcus aureus) 2009    right groin area-no issues now. 04-07-14 PCR screen negative today.  . Depression   . Anemia     Iron deficinecy anemia  . History of radiation therapy 09/09/13-10/28/13    45 gray to left breast, lumpectomy cavity boosted to 63 gray  . Neuropathy   . Arthritis   . Breast cancer     left ,last radiation 2'15, last chemo 8'14    Past Surgical History  Procedure Laterality Date  . Anal sphincterotomy  04/2011  . Hemorrhoid surgery  04/2011    ligation  . Breast lumpectomy with needle localization Left 02/04/2013    Procedure: LEFT BREAST LUMPECTOMY WITH NEEDLE LOCALIZATION;  Surgeon: Stark Klein, MD;  Location: [facility];  Service: General;  Laterality: Left;  . Axillary lymph node dissection Left 02/04/2013    Procedure: LEFT AXILLARY LYMPH NODE DISSECTION;  Surgeon: Stark Klein, MD;  Location: [facility];  Service: General;  Laterality: Left;  End: 8315  . Portacath placement Right 02/04/2013    Procedure: INSERTION PORT-A-CATH;  Surgeon: Stark Klein, MD;  Location: [facility];  Service: General;  Laterality: Right;  Start Time: 1761.  Marland Kitchen Appendectomy  1980  . Breast surgery      Lumpectomy in april 2014  . Shoulder arthroscopy with rotator cuff repair and subacromial decompression Left 02/24/2014    Procedure: SHOULDER ARTHROSCOPY WITH ROTATOR  CUFF REPAIR AND SUBACROMIAL DECOMPRESSION;  Surgeon: Meredith Pel, MD;  Location: [facility];  Service: Orthopedics;  Laterality: Left;  LEFT SHOULDER DIAGNOSTIC OPERATIVE ARTHROSCOPY, SUBACROMIAL DECOMPRESSION, ROTATOR CUFF TEAR REPAIR  . Port-a-cath removal N/A 04/16/2014    Procedure: REMOVAL PORT-A-CATH;  Surgeon: Stark Klein, MD;  Location[facility];  Service: General;  Laterality: N/A;    LMP 01/20/2013  Visit Diagnosis:  Balance problems  Functional gait disorder  Lymphedema of breast      Subjective Assessment - 12/01/14 1355    Symptoms pt feels like she has made progress with her balance and is able to do the steps better at home. She says she is not seeing progress in reducing her breast lymphedema at home. She is not having falls at home.She is on the list for a live strong program at a [employer] near her.   Currently in Pain? Yes   Pain Score 1    Pain Location Axilla   Pain Orientation Left   Pain Radiating Towards toward breast.        Manual lymph drainage in supine as follows: short neck, right axillary nodes, left inguinal nodes, superficial and deep abdominals; anterior inter-axillary anastamoses, left axillo-inguinal anastamoses. Left chest, breast and lateral trunk directing toward inguinal nodes. Small dotted peach foam in thick stockinette placed below breast in bra to provide some compression to  this area.          Short Term Clinic Goals - 09/25/14 1308    CC Short Term Goal  #1   Title short term goals= long term goals   Time --  4   Period Weeks   Status New             Long Term Clinic Goals - 12/01/14 1607    CC Long Term Goal  #1   Title Patient will improve dynamic gait index score to 16   Status Achieved   CC Long Term Goal  #2   Title Patient will increase Berg Balance score to 52   Status Achieved   CC Long Term Goal  #3   Title Patient will report 50 percent decrease in feet discomfort symptoms   Status Achieved   CC Long Term Goal   #4   Title pt will report 50% decrease in breast lymphedema symptoms.   Time 6   Period Weeks   Status On-going            Plan - 12/01/14 1600    Clinical Impression Statement Pt wants to focus on decreasing her breast edema as she feels she has improved in her balance and plans to conitnue with exercise at home. Tried small dotted peach foam at under breast, but myofascial tightness at axially scar is difficult to reach with compression pad.  Renewal sent to MD for 6 more weeks   PT Frequency 2x / week   PT Duration 6 weeks   PT Next Visit Plan Manual techniques with myofascial release to left axillary scar -try approaching this area in sitting. Assess effect of peach foam on left breast. continue with manual lymph drainage to left breast with self instruction. Possiblly lend klose training DVD if needed.         Problem List Patient Active Problem List   Diagnosis Date Noted  . Shingles 05/15/2014  . Depressed 05/15/2014  . Anxiety 05/15/2014  . Breast pain 05/15/2014  . Rotator cuff tear 02/24/2014  . Hot flashes due to tamoxifen 02/20/2014  . Left shoulder pain with abduction 12/12/2013  . Unspecified hereditary and idiopathic peripheral neuropathy 11/11/2013  . CVA (cerebral infarction) 07/18/2013  . TIA (transient ischemic attack) 07/18/2013  . Contracture of axilla 05/20/2013  . Primary cancer of upper inner quadrant of left female breast 01/24/2013  . Anal fissure 05/03/2011  . Hemorrhoids, internal, with prolapse 05/03/2011   Donato Heinz. Owens Shark, PT   12/01/2014, 4:09 PM  [place], [name], [address] Phone: [phone]   Fax:  [phone]

## 2014-12-01 NOTE — Therapy (Signed)
[place], [name], [address] Phone: [phone]   Fax:  [phone]  Physical Therapy Treatment  Patient Details  Name: Kristi [name] MRN: [id number] Date of Birth: 1956-04-28 Referring Provider:  Ron Parker, MD  Encounter Date: 11/24/2014    Past Medical History  Diagnosis Date  . Anxiety   . Hypertension   . MRSA (methicillin resistant Staphylococcus aureus) 2009    right groin area-no issues now. 04-07-14 PCR screen negative today.  . Depression   . Anemia     Iron deficinecy anemia  . History of radiation therapy 09/09/13-10/28/13    45 gray to left breast, lumpectomy cavity boosted to 63 gray  . Neuropathy   . Arthritis   . Breast cancer     left ,last radiation 2'15, last chemo 8'14    Past Surgical History  Procedure Laterality Date  . Anal sphincterotomy  04/2011  . Hemorrhoid surgery  04/2011    ligation  . Breast lumpectomy with needle localization Left 02/04/2013    Procedure: LEFT BREAST LUMPECTOMY WITH NEEDLE LOCALIZATION;  Surgeon: Stark Klein, MD;  Location: [facility];  Service: General;  Laterality: Left;  . Axillary lymph node dissection Left 02/04/2013    Procedure: LEFT AXILLARY LYMPH NODE DISSECTION;  Surgeon: Stark Klein, MD;  Location: [facility];  Service: General;  Laterality: Left;  End: 7124  . Portacath placement Right 02/04/2013    Procedure: INSERTION PORT-A-CATH;  Surgeon: Stark Klein, MD;  Location: [facility];  Service: General;  Laterality: Right;  Start Time: 5809.  Marland Kitchen Appendectomy  1980  . Breast surgery      Lumpectomy in april 2014  . Shoulder arthroscopy with rotator cuff repair and subacromial decompression Left 02/24/2014    Procedure: SHOULDER ARTHROSCOPY WITH ROTATOR CUFF REPAIR AND SUBACROMIAL DECOMPRESSION;  Surgeon: Meredith Pel, MD;  Location: [facility];  Service: Orthopedics;  Laterality: Left;  LEFT SHOULDER DIAGNOSTIC OPERATIVE ARTHROSCOPY, SUBACROMIAL  DECOMPRESSION, ROTATOR CUFF TEAR REPAIR  . Port-a-cath removal N/A 04/16/2014    Procedure: REMOVAL PORT-A-CATH;  Surgeon: Stark Klein, MD;  [facility];  Service: General;  Laterality: N/A;    LMP 01/20/2013  Visit Diagnosis:  Chemotherapy-induced peripheral neuropathy  Balance problems  Functional gait disorder  Lymphedema of breast                             Short Term Clinic Goals - 09/25/14 1308    CC Short Term Goal  #1   Title short term goals= long term goals   Time --  4   Period Weeks   Status New             Long Term Clinic Goals - 11/19/14 1402    CC Long Term Goal  #1   Title Patient will improve dynamic gait index score to 16   Status Achieved   CC Long Term Goal  #4   Title pt will report 50% decrease in breast lymphedema symptoms.   Status On-going            Problem List Patient Active Problem List   Diagnosis Date Noted  . Shingles 05/15/2014  . Depressed 05/15/2014  . Anxiety 05/15/2014  . Breast pain 05/15/2014  . Rotator cuff tear 02/24/2014  . Hot flashes due to tamoxifen 02/20/2014  . Left shoulder pain with abduction 12/12/2013  . Unspecified hereditary and idiopathic peripheral neuropathy 11/11/2013  .  CVA (cerebral infarction) 07/18/2013  . TIA (transient ischemic attack) 07/18/2013  . Contracture of axilla 05/20/2013  . Primary cancer of upper inner quadrant of left female breast 01/24/2013  . Anal fissure 05/03/2011  . Hemorrhoids, internal, with prolapse 05/03/2011    Otelia Limes, PTA 12/01/2014, 12:03 PM  [place], [name], [address] Phone: [phone]   Fax:  [phone]

## 2014-12-03 ENCOUNTER — Ambulatory Visit: Payer: BC Managed Care – PPO | Admitting: Physical Therapy

## 2014-12-03 DIAGNOSIS — R2689 Other abnormalities of gait and mobility: Secondary | ICD-10-CM

## 2014-12-03 DIAGNOSIS — G620 Drug-induced polyneuropathy: Secondary | ICD-10-CM

## 2014-12-03 DIAGNOSIS — T451X5A Adverse effect of antineoplastic and immunosuppressive drugs, initial encounter: Secondary | ICD-10-CM

## 2014-12-03 DIAGNOSIS — I890 Lymphedema, not elsewhere classified: Secondary | ICD-10-CM

## 2014-12-03 DIAGNOSIS — G622 Polyneuropathy due to other toxic agents: Secondary | ICD-10-CM | POA: Diagnosis not present

## 2014-12-03 DIAGNOSIS — R269 Unspecified abnormalities of gait and mobility: Secondary | ICD-10-CM

## 2014-12-03 NOTE — Patient Instructions (Signed)
Wall stretch for shoulder abduction

## 2014-12-03 NOTE — Therapy (Signed)
[place], [name], [address] Phone: [phone]   Fax:  [phone]  Physical Therapy Treatment  Patient Details  Name: Kristi [name] MRN: [id number] Date of Birth: [birth year] Referring Provider:  Ron Parker, MD  Encounter Date: 12/03/2014      PT End of Session - 12/03/14 1754    Visit Number 19   Number of Visits 28   Date for PT Re-Evaluation 01/01/15   PT Start Time 8466   PT Stop Time 1430   PT Time Calculation (min) 45 min   Activity Tolerance Patient tolerated treatment well   Behavior During Therapy [facility] for tasks assessed/performed      Past Medical History  Diagnosis Date  . Anxiety   . Hypertension   . MRSA (methicillin resistant Staphylococcus aureus) 2009    right groin area-no issues now. 04-07-14 PCR screen negative today.  . Depression   . Anemia     Iron deficinecy anemia  . History of radiation therapy 09/09/13-10/28/13    45 gray to left breast, lumpectomy cavity boosted to 63 gray  . Neuropathy   . Arthritis   . Breast cancer     left ,last radiation 2'15, last chemo 8'14    Past Surgical History  Procedure Laterality Date  . Anal sphincterotomy  04/2011  . Hemorrhoid surgery  04/2011    ligation  . Breast lumpectomy with needle localization Left 02/04/2013    Procedure: LEFT BREAST LUMPECTOMY WITH NEEDLE LOCALIZATION;  Surgeon: Stark Klein, MD;  Location: [facility];  Service: General;  Laterality: Left;  . Axillary lymph node dissection Left 02/04/2013    Procedure: LEFT AXILLARY LYMPH NODE DISSECTION;  Surgeon: Stark Klein, MD;  Location: [facility];  Service: General;  Laterality: Left;  End: 5993  . Portacath placement Right 02/04/2013    Procedure: INSERTION PORT-A-CATH;  Surgeon: Stark Klein, MD;  Location: [facility];  Service: General;  Laterality: Right;  Start Time: 5701.  Marland Kitchen Appendectomy  1980  . Breast surgery      Lumpectomy in april 2014  . Shoulder arthroscopy with  rotator cuff repair and subacromial decompression Left 02/24/2014    Procedure: SHOULDER ARTHROSCOPY WITH ROTATOR CUFF REPAIR AND SUBACROMIAL DECOMPRESSION;  Surgeon: Meredith Pel, MD;  Location: [facility];  Service: Orthopedics;  Laterality: Left;  LEFT SHOULDER DIAGNOSTIC OPERATIVE ARTHROSCOPY, SUBACROMIAL DECOMPRESSION, ROTATOR CUFF TEAR REPAIR  . Port-a-cath removal N/A 04/16/2014    Procedure: REMOVAL PORT-A-CATH;  Surgeon: Stark Klein, MD;  [facility];  Service: General;  Laterality: N/A;    LMP 01/20/2013  Visit Diagnosis:  Balance problems  Functional gait disorder  Lymphedema of breast  Chemotherapy-induced peripheral neuropathy      Subjective Assessment - 12/03/14 1404    Symptoms pt comes in wearing boot as she was unable to wear her shoes today. She says she has worn the peach foam in her bra and thought it may have helped a bit, but she does not have it on today.     Currently in Pain? Yes   Pain Score 2                     [facility] Adult PT Treatment/Exercise - 12/03/14 1749    Neck Exercises: Seated   Cervical Rotation Both;5 reps   Shoulder Rolls Backwards;5 reps   Other Seated Exercise upper thoracice extension stretch   Knee/Hip Exercises: Aerobic   Stationary Bike Level 2 x11 mins  Shoulder Exercises: Stretch   Wall Stretch - ABduction 3 reps      Manual techniques for trigger point release and soft tissue mobilization to left pec major muscle/anterior chest And axilla             Short Term Clinic Goals - 09/25/14 1308    CC Short Term Goal  #1   Title short term goals= long term goals   Time --  4   Period Weeks   Status New             Long Term Clinic Goals - 12/01/14 1607    CC Long Term Goal  #1   Title Patient will improve dynamic gait index score to 16   Status Achieved   CC Long Term Goal  #2   Title Patient will increase Berg Balance score to 52   Status Achieved   CC Long Term Goal  #3   Title  Patient will report 50 percent decrease in feet discomfort symptoms   Status Achieved   CC Long Term Goal  #4   Title pt will report 50% decrease in breast lymphedema symptoms.   Time 6   Period Weeks   Status On-going            Plan - 12/03/14 1756    Clinical Impression Statement Pt feels breast edema may be a lttle better; she does not tolerate compression well.  some decreased in palpable pec major tightness with manual work today.  Pt said the [facility] does not have enough participants for [employer].  She will check into fitness program at [place] of today's manual treatment.  Reinforce neck range of motion and anterior chest stretching assess if pt able to do self Manual lymph drainage and use comopression. . continue to work on lower extremity/balance strength.        Problem List Patient Active Problem List   Diagnosis Date Noted  . Shingles 05/15/2014  . Depressed 05/15/2014  . Anxiety 05/15/2014  . Breast pain 05/15/2014  . Rotator cuff tear 02/24/2014  . Hot flashes due to tamoxifen 02/20/2014  . Left shoulder pain with abduction 12/12/2013  . Unspecified hereditary and idiopathic peripheral neuropathy 11/11/2013  . CVA (cerebral infarction) 07/18/2013  . TIA (transient ischemic attack) 07/18/2013  . Contracture of axilla 05/20/2013  . Primary cancer of upper inner quadrant of left female breast 01/24/2013  . Anal fissure 05/03/2011  . Hemorrhoids, internal, with prolapse 05/03/2011   Donato Heinz. Owens Shark, PT   12/03/2014, 6:10 PM  [place], [name], [address] Phone: [phone]   Fax:  [phone]

## 2014-12-08 ENCOUNTER — Ambulatory Visit: Payer: BC Managed Care – PPO | Attending: Orthopedic Surgery | Admitting: Physical Therapy

## 2014-12-08 DIAGNOSIS — T451X5A Adverse effect of antineoplastic and immunosuppressive drugs, initial encounter: Secondary | ICD-10-CM

## 2014-12-08 DIAGNOSIS — R29818 Other symptoms and signs involving the nervous system: Secondary | ICD-10-CM | POA: Insufficient documentation

## 2014-12-08 DIAGNOSIS — G622 Polyneuropathy due to other toxic agents: Secondary | ICD-10-CM | POA: Diagnosis present

## 2014-12-08 DIAGNOSIS — R269 Unspecified abnormalities of gait and mobility: Secondary | ICD-10-CM | POA: Diagnosis present

## 2014-12-08 DIAGNOSIS — G620 Drug-induced polyneuropathy: Secondary | ICD-10-CM

## 2014-12-08 DIAGNOSIS — R2689 Other abnormalities of gait and mobility: Secondary | ICD-10-CM

## 2014-12-08 DIAGNOSIS — I890 Lymphedema, not elsewhere classified: Secondary | ICD-10-CM

## 2014-12-09 ENCOUNTER — Telehealth: Payer: Self-pay | Admitting: *Deleted

## 2014-12-09 DIAGNOSIS — C50212 Malignant neoplasm of upper-inner quadrant of left female breast: Secondary | ICD-10-CM

## 2014-12-09 MED ORDER — TAMOXIFEN CITRATE 20 MG PO TABS: 20.0000 mg | ORAL_TABLET | Freq: Every day | ORAL | Status: DC

## 2014-12-09 NOTE — Therapy (Signed)
[place], [name], [address] Phone: [phone]   Fax:  [phone]  Physical Therapy Treatment  Patient Details  Name: Kristi [name] MRN: [id number] Date of Birth: 01/29/1956 Referring Provider:  Ron Parker, MD  Encounter Date: 12/08/2014      PT End of Session - 12/09/14 0721    Visit Number 20   Number of Visits 28   Date for PT Re-Evaluation 01/01/15   PT Start Time 1102   PT Stop Time 1147   PT Time Calculation (min) 45 min      Past Medical History  Diagnosis Date  . Anxiety   . Hypertension   . MRSA (methicillin resistant Staphylococcus aureus) 2009    right groin area-no issues now. 04-07-14 PCR screen negative today.  . Depression   . Anemia     Iron deficinecy anemia  . History of radiation therapy 09/09/13-10/28/13    45 gray to left breast, lumpectomy cavity boosted to 63 gray  . Neuropathy   . Arthritis   . Breast cancer     left ,last radiation 2'15, last chemo 8'14    Past Surgical History  Procedure Laterality Date  . Anal sphincterotomy  04/2011  . Hemorrhoid surgery  04/2011    ligation  . Breast lumpectomy with needle localization Left 02/04/2013    Procedure: LEFT BREAST LUMPECTOMY WITH NEEDLE LOCALIZATION;  Surgeon: Stark Klein, MD;  Location: [facility];  Service: General;  Laterality: Left;  . Axillary lymph node dissection Left 02/04/2013    Procedure: LEFT AXILLARY LYMPH NODE DISSECTION;  Surgeon: Stark Klein, MD;  Location: [facility];  Service: General;  Laterality: Left;  End: 3664  . Portacath placement Right 02/04/2013    Procedure: INSERTION PORT-A-CATH;  Surgeon: Stark Klein, MD;  Location: [facility];  Service: General;  Laterality: Right;  Start Time: 4034.  Marland Kitchen Appendectomy  1980  . Breast surgery      Lumpectomy in april 2014  . Shoulder arthroscopy with rotator cuff repair and subacromial decompression Left 02/24/2014    Procedure: SHOULDER ARTHROSCOPY WITH ROTATOR  CUFF REPAIR AND SUBACROMIAL DECOMPRESSION;  Surgeon: Meredith Pel, MD;  Location: [facility];  Service: Orthopedics;  Laterality: Left;  LEFT SHOULDER DIAGNOSTIC OPERATIVE ARTHROSCOPY, SUBACROMIAL DECOMPRESSION, ROTATOR CUFF TEAR REPAIR  . Port-a-cath removal N/A 04/16/2014    Procedure: REMOVAL PORT-A-CATH;  Surgeon: Stark Klein, MD;  [facility];  Service: General;  Laterality: N/A;    LMP 01/20/2013  Visit Diagnosis:  Balance problems  Lymphedema of breast  Chemotherapy-induced peripheral neuropathy  Functional gait disorder      Subjective Assessment - 12/08/14 1115    Symptoms pt states her feet have been hurting since Sunday.  She may have worn the wrong shoes to set this off. They were comfortable at the time This is the only thing she can think of that may have agitated her feet.  She is frustrated and tearful at the end of sesson.  She recognizes she has made some gains in strength and function,  but still is having lots of pain in feet and left upper quandrant    Currently in Pain? Yes   Pain Score 9    Pain Location Foot   Pain Orientation Right;Left  left is worse    Pain Descriptors / Indicators Aching;Cramping  cramping in toes                    [facility] Adult  PT Treatment/Exercise - 12/09/14 0716    Knee/Hip Exercises: Aerobic   Stationary Bike Level 2 x8 mins   Moist Heat Therapy   Number Minutes Moist Heat 10 Minutes   Moist Heat Location Other (comment)  both feet   Manual Therapy   Massage soft and deep tissue to both feet   Myofascial Release to trigger points and myofascial restrictionion in both feet   Manual Lymphatic Drainage (MLD) briefly to left anterior chest and abdomen below breast                   Short Term Clinic Goals - 09/25/14 1308    CC Short Term Goal  #1   Title short term goals= long term goals   Time --  4   Period Weeks   Status New             Long Term Clinic Goals - 12/09/14 0350    CC  Long Term Goal  #3   Title Patient will report 50 percent decrease in feet discomfort symptoms   Period Weeks   CC Long Term Goal  #4   Title pt will report 50% decrease in breast lymphedema symptoms.   Period Weeks   Status On-going   CC Long Term Goal  #5   Title pt will be independent in home exercise program   Time 4   Period Weeks   Status New            Plan - 12/09/14 0723    Clinical Impression Statement Focus today was heat and sof ttissue work. Kristi [name] has been trying lots of things to help with her feet and upper quandrant pain but it continues.  [employer] program near her does not have enough participants for her to transition to community exercise.  Feel she would benefit from learning routine of [employer] program so that she will ahve a "Go To" proressive strength program for home.   PT Next Visit Plan Instruct in [employer] program. and logging.        Problem List Patient Active Problem List   Diagnosis Date Noted  . Shingles 05/15/2014  . Depressed 05/15/2014  . Anxiety 05/15/2014  . Breast pain 05/15/2014  . Rotator cuff tear 02/24/2014  . Hot flashes due to tamoxifen 02/20/2014  . Left shoulder pain with abduction 12/12/2013  . Unspecified hereditary and idiopathic peripheral neuropathy 11/11/2013  . CVA (cerebral infarction) 07/18/2013  . TIA (transient ischemic attack) 07/18/2013  . Contracture of axilla 05/20/2013  . Primary cancer of upper inner quadrant of left female breast 01/24/2013  . Anal fissure 05/03/2011  . Hemorrhoids, internal, with prolapse 05/03/2011   Donato Heinz. Owens Shark PT   [name] 12/09/2014, 7:30 AM  [place], [name], [address] Phone: [phone]   Fax:  [phone]

## 2014-12-09 NOTE — Telephone Encounter (Signed)
Patient called requesting refill on tamoxifen. Refill sent to patient's pharmacy and she is agreeable to pick it up. While on the phone, I confirmed patient's appt with Dr. [name] on 03/23/15 - patient wrote down appt date and time.

## 2014-12-10 ENCOUNTER — Ambulatory Visit: Payer: BC Managed Care – PPO

## 2014-12-10 DIAGNOSIS — I890 Lymphedema, not elsewhere classified: Secondary | ICD-10-CM

## 2014-12-10 DIAGNOSIS — T451X5A Adverse effect of antineoplastic and immunosuppressive drugs, initial encounter: Secondary | ICD-10-CM

## 2014-12-10 DIAGNOSIS — G620 Drug-induced polyneuropathy: Secondary | ICD-10-CM

## 2014-12-10 DIAGNOSIS — R269 Unspecified abnormalities of gait and mobility: Secondary | ICD-10-CM

## 2014-12-10 DIAGNOSIS — G622 Polyneuropathy due to other toxic agents: Secondary | ICD-10-CM | POA: Diagnosis not present

## 2014-12-10 DIAGNOSIS — R2689 Other abnormalities of gait and mobility: Secondary | ICD-10-CM

## 2014-12-10 NOTE — Therapy (Signed)
[place], [name], [address] Phone: [phone]   Fax:  [phone]  Physical Therapy Treatment  Patient Details  Name: Kristi [name] MRN: [id number] Date of Birth: 10-21-1956 Referring Provider:  Ron Parker, MD  Encounter Date: 12/10/2014      PT End of Session - 12/10/14 1328    Visit Number 21   Number of Visits 28   Date for PT Re-Evaluation 01/01/15   PT Start Time 6962   PT Stop Time 9528   PT Time Calculation (min) 46 min      Past Medical History  Diagnosis Date  . Anxiety   . Hypertension   . MRSA (methicillin resistant Staphylococcus aureus) 2009    right groin area-no issues now. 04-07-14 PCR screen negative today.  . Depression   . Anemia     Iron deficinecy anemia  . History of radiation therapy 09/09/13-10/28/13    45 gray to left breast, lumpectomy cavity boosted to 63 gray  . Neuropathy   . Arthritis   . Breast cancer     left ,last radiation 2'15, last chemo 8'14    Past Surgical History  Procedure Laterality Date  . Anal sphincterotomy  04/2011  . Hemorrhoid surgery  04/2011    ligation  . Breast lumpectomy with needle localization Left 02/04/2013    Procedure: LEFT BREAST LUMPECTOMY WITH NEEDLE LOCALIZATION;  Surgeon: Stark Klein, MD;  Location: [facility];  Service: General;  Laterality: Left;  . Axillary lymph node dissection Left 02/04/2013    Procedure: LEFT AXILLARY LYMPH NODE DISSECTION;  Surgeon: Stark Klein, MD;  Location: [facility];  Service: General;  Laterality: Left;  End: 4132  . Portacath placement Right 02/04/2013    Procedure: INSERTION PORT-A-CATH;  Surgeon: Stark Klein, MD;  Location: [facility];  Service: General;  Laterality: Right;  Start Time: 4401.  Marland Kitchen Appendectomy  1980  . Breast surgery      Lumpectomy in april 2014  . Shoulder arthroscopy with rotator cuff repair and subacromial decompression Left 02/24/2014    Procedure: SHOULDER ARTHROSCOPY WITH ROTATOR  CUFF REPAIR AND SUBACROMIAL DECOMPRESSION;  Surgeon: Meredith Pel, MD;  Location: [facility];  Service: Orthopedics;  Laterality: Left;  LEFT SHOULDER DIAGNOSTIC OPERATIVE ARTHROSCOPY, SUBACROMIAL DECOMPRESSION, ROTATOR CUFF TEAR REPAIR  . Port-a-cath removal N/A 04/16/2014    Procedure: REMOVAL PORT-A-CATH;  Surgeon: Stark Klein, MD;  Location: [facility];  Service: General;  Laterality: N/A;    LMP 01/20/2013  Visit Diagnosis:  Balance problems  Lymphedema of breast  Chemotherapy-induced peripheral neuropathy  Functional gait disorder      Subjective Assessment - 12/10/14 1306    Symptoms My feet feel much better today, still hurting some. My big toes are still very sore, made an appt with a podiatrist for next week. My pain doctor gave me a new cream to use that I just got today that has lidocain, gabapentin, kerotin and a few other ingredients to help with the nerve pain. Cant wait to try it.     Currently in Pain? No/denies                    [facility] Adult PT Treatment/Exercise - 12/10/14 0001    Knee/Hip Exercises: Aerobic   Stationary Bike Level 2 x8 mins   Shoulder Exercises: Stretch   Other Shoulder Stretches All Stretches from Strength ABC Program: Bil Chest, shoulder, tricep, calf, quadriceps, seated hamstring and figure 4 piriformis, supine bil  low trunk rotation   Other Shoulder Stretches All Strength from Strength ABC Program: Bridges, bil S/L clamshell, Superwoman (unable to do this due to coordination and limited Lt UE strength which caused pt have LOB), Chest Press 2lbs, Standing "W" against wall, bil hip abduction, scaption against wall 2lbs (had to stop here for today due to time),  all 10 reps each.                  PT Education - 12/10/14 1327    Education provided Yes   Education Details Strength ABC Program   Person(s) Educated Patient   Methods Explanation;Demonstration;Verbal cues;Handout   Comprehension Verbalized understanding;Returned  demonstration;Verbal cues required;Tactile cues required;Need further instruction           Short Term Clinic Goals - 09/25/14 1308    CC Short Term Goal  #1   Title short term goals= long term goals   Time --  4   Period Weeks   Status New             Long Term Clinic Goals - 12/09/14 2130    CC Long Term Goal  #3   Title Patient will report 50 percent decrease in feet discomfort symptoms   Period Weeks   CC Long Term Goal  #4   Title pt will report 50% decrease in breast lymphedema symptoms.   Period Weeks   Status On-going   CC Long Term Goal  #5   Title pt will be independent in home exercise program   Time 4   Period Weeks   Status New            Plan - 12/10/14 1328    Clinical Impression Statement Began instructing pt today in the Strength ABC Program, she has a good base understanding of some exercises though will need review for proper arm positioning for the UE stretches and required mod verbal cuing for correct technique of chest press.  Also instructed pt in reasoning for only doing this 2x/wk due to lymphatic load, she verbalized understanding this.    Pt will benefit from skilled therapeutic intervention in order to improve on the following deficits Decreased balance;Decreased activity tolerance;Abnormal gait;Increased muscle spasms;Increased edema   Rehab Potential Good   PT Frequency 2x / week   PT Duration 6 weeks   PT Treatment/Interventions Electrical Stimulation;Moist Heat;Therapeutic exercise;Therapeutic activities;Balance training;Patient/family education;Neuromuscular re-education;Manual lymph drainage   PT Next Visit Plan Finish and review Strength ABC Program.    Consulted and Agree with Plan of Care Patient        Problem List Patient Active Problem List   Diagnosis Date Noted  . Shingles 05/15/2014  . Depressed 05/15/2014  . Anxiety 05/15/2014  . Breast pain 05/15/2014  . Rotator cuff tear 02/24/2014  . Hot flashes due to  tamoxifen 02/20/2014  . Left shoulder pain with abduction 12/12/2013  . Unspecified hereditary and idiopathic peripheral neuropathy 11/11/2013  . CVA (cerebral infarction) 07/18/2013  . TIA (transient ischemic attack) 07/18/2013  . Contracture of axilla 05/20/2013  . Primary cancer of upper inner quadrant of left female breast 01/24/2013  . Anal fissure 05/03/2011  . Hemorrhoids, internal, with prolapse 05/03/2011    Collie Siad Ann,PTA 12/10/2014, 1:49 PM  [place], [name], [address] Phone: [phone]   Fax:  [phone]

## 2014-12-15 ENCOUNTER — Ambulatory Visit: Payer: BC Managed Care – PPO

## 2014-12-15 DIAGNOSIS — R2689 Other abnormalities of gait and mobility: Secondary | ICD-10-CM

## 2014-12-15 DIAGNOSIS — I890 Lymphedema, not elsewhere classified: Secondary | ICD-10-CM

## 2014-12-15 DIAGNOSIS — G622 Polyneuropathy due to other toxic agents: Secondary | ICD-10-CM | POA: Diagnosis not present

## 2014-12-15 DIAGNOSIS — G620 Drug-induced polyneuropathy: Secondary | ICD-10-CM

## 2014-12-15 DIAGNOSIS — R269 Unspecified abnormalities of gait and mobility: Secondary | ICD-10-CM

## 2014-12-15 DIAGNOSIS — T451X5A Adverse effect of antineoplastic and immunosuppressive drugs, initial encounter: Secondary | ICD-10-CM

## 2014-12-15 NOTE — Therapy (Addendum)
[place], [name], [address] Phone: [phone]   Fax:  [phone]  Physical Therapy Treatment  Patient Details  Name: Kristi [name] MRN: [id number] Date of Birth: November 18, 1955 Referring Provider:  Ron Parker, MD  Encounter Date: 12/15/2014      PT End of Session - 12/15/14 1115    Visit Number 22   Number of Visits 28   Date for PT Re-Evaluation 01/01/15   PT Start Time 1107   PT Stop Time 1148   PT Time Calculation (min) 41 min      Past Medical History  Diagnosis Date  . Anxiety   . Hypertension   . MRSA (methicillin resistant Staphylococcus aureus) 2009    right groin area-no issues now. 04-07-14 PCR screen negative today.  . Depression   . Anemia     Iron deficinecy anemia  . History of radiation therapy 09/09/13-10/28/13    45 gray to left breast, lumpectomy cavity boosted to 63 gray  . Neuropathy   . Arthritis   . Breast cancer     left ,last radiation 2'15, last chemo 8'14    Past Surgical History  Procedure Laterality Date  . Anal sphincterotomy  04/2011  . Hemorrhoid surgery  04/2011    ligation  . Breast lumpectomy with needle localization Left 02/04/2013    Procedure: LEFT BREAST LUMPECTOMY WITH NEEDLE LOCALIZATION;  Surgeon: Stark Klein, MD;  Location: [facility];  Service: General;  Laterality: Left;  . Axillary lymph node dissection Left 02/04/2013    Procedure: LEFT AXILLARY LYMPH NODE DISSECTION;  Surgeon: Stark Klein, MD;  Location: [facility];  Service: General;  Laterality: Left;  End: 1779  . Portacath placement Right 02/04/2013    Procedure: INSERTION PORT-A-CATH;  Surgeon: Stark Klein, MD;  Location: [facility];  Service: General;  Laterality: Right;  Start Time: 3903.  Marland Kitchen Appendectomy  1980  . Breast surgery      Lumpectomy in april 2014  . Shoulder arthroscopy with rotator cuff repair and subacromial decompression Left 02/24/2014    Procedure: SHOULDER ARTHROSCOPY WITH ROTATOR  CUFF REPAIR AND SUBACROMIAL DECOMPRESSION;  Surgeon: Meredith Pel, MD;  Location: [facility];  Service: Orthopedics;  Laterality: Left;  LEFT SHOULDER DIAGNOSTIC OPERATIVE ARTHROSCOPY, SUBACROMIAL DECOMPRESSION, ROTATOR CUFF TEAR REPAIR  . Port-a-cath removal N/A 04/16/2014    Procedure: REMOVAL PORT-A-CATH;  Surgeon: Stark Klein, MD;  [facility];  Service: General;  Laterality: N/A;    LMP 01/20/2013  Visit Diagnosis:  Balance problems  Lymphedema of breast  Chemotherapy-induced peripheral neuropathy  Functional gait disorder      Subjective Assessment - 12/15/14 1109    Symptoms I had 2 spots of ingrown nails removed from my Lt great toe on Friday so Im limited on what I can do today. Been taking pain meds for my toe pain since then, its been really sore.                      [facility] Adult PT Treatment/Exercise - 12/15/14 0001    Manual Therapy   Manual Lymphatic Drainage (MLD) In Supine: Short neck, Rt axilla and Lt inguinal nodes, superficial and deep abdoinals, anterior inter-axillary and Lt axillo-inguinal anastomosis, and Lt breast redirecting along anastomosis.     Also performed manual lymph drainage in Rt S/L for posterior inter-axillary and Lt axillo-inguinal anastomosis and for better access to lateral Lt breast, pt had good softening of tissue towards end of  session.   Pt interested in pursuing a [employer] pump for Lt breast swelling. Will send pts demographic info to them so they can get her set up for a demonstration to see if pt will find this beneficial.              Short Term Clinic Goals - 09/25/14 1308    CC Short Term Goal  #1   Title short term goals= long term goals   Time --  4   Period Weeks   Status New             Long Term Clinic Goals - 12/09/14 1657    CC Long Term Goal  #3   Title Patient will report 50 percent decrease in feet discomfort symptoms   Period Weeks   CC Long Term Goal  #4   Title pt will report  50% decrease in breast lymphedema symptoms.   Period Weeks   Status On-going   CC Long Term Goal  #5   Title pt will be independent in home exercise program   Time 4   Period Weeks   Status New            Plan - 12/15/14 1115    Clinical Impression Statement Pt wanted to limit activites today as she has been sore from reently having 2 ingrown toenails removed, so performed manual lymph drainage to Lt breast as she reports this was swollen more today anyways.   Pt will benefit from skilled therapeutic intervention in order to improve on the following deficits Decreased balance;Decreased activity tolerance;Abnormal gait;Increased muscle spasms;Increased edema   Rehab Potential Good   Clinical Impairments Affecting Rehab Potential chemotherapy induced peripheral neuropathy   PT Frequency 2x / week   PT Duration 6 weeks   PT Treatment/Interventions Electrical Stimulation;Moist Heat;Therapeutic exercise;Therapeutic activities;Balance training;Patient/family education;Neuromuscular re-education;Manual lymph drainage   PT Next Visit Plan Finish and review Strength ABC Program if pts foot pain has improved.   Consulted and Agree with Plan of Care Patient        Problem List Patient Active Problem List   Diagnosis Date Noted  . Shingles 05/15/2014  . Depressed 05/15/2014  . Anxiety 05/15/2014  . Breast pain 05/15/2014  . Rotator cuff tear 02/24/2014  . Hot flashes due to tamoxifen 02/20/2014  . Left shoulder pain with abduction 12/12/2013  . Unspecified hereditary and idiopathic peripheral neuropathy 11/11/2013  . CVA (cerebral infarction) 07/18/2013  . TIA (transient ischemic attack) 07/18/2013  . Contracture of axilla 05/20/2013  . Primary cancer of upper inner quadrant of left female breast 01/24/2013  . Anal fissure 05/03/2011  . Hemorrhoids, internal, with prolapse 05/03/2011    Otelia Limes, PTA 12/15/2014, 11:49 AM  [place], [name], [address] Phone: [phone]   Fax:  [phone]

## 2014-12-17 ENCOUNTER — Ambulatory Visit: Payer: BC Managed Care – PPO

## 2014-12-17 DIAGNOSIS — G622 Polyneuropathy due to other toxic agents: Secondary | ICD-10-CM | POA: Diagnosis not present

## 2014-12-17 DIAGNOSIS — G620 Drug-induced polyneuropathy: Secondary | ICD-10-CM

## 2014-12-17 DIAGNOSIS — T451X5A Adverse effect of antineoplastic and immunosuppressive drugs, initial encounter: Secondary | ICD-10-CM

## 2014-12-17 DIAGNOSIS — R2689 Other abnormalities of gait and mobility: Secondary | ICD-10-CM

## 2014-12-17 DIAGNOSIS — I890 Lymphedema, not elsewhere classified: Secondary | ICD-10-CM

## 2014-12-17 DIAGNOSIS — R269 Unspecified abnormalities of gait and mobility: Secondary | ICD-10-CM

## 2014-12-17 NOTE — Therapy (Signed)
[place], [name], [address] Phone: [phone]   Fax:  [phone]  Physical Therapy Treatment  Patient Details  Name: Kristi [name] MRN: [id number] Date of Birth: [birth year] Referring Provider:  Ron Parker, MD  Encounter Date: 12/17/2014      PT End of Session - 12/17/14 1348    Visit Number 23   Number of Visits 28   Date for PT Re-Evaluation 01/01/15   PT Start Time 1304   PT Stop Time 1346   PT Time Calculation (min) 42 min      Past Medical History  Diagnosis Date  . Anxiety   . Hypertension   . MRSA (methicillin resistant Staphylococcus aureus) 2009    right groin area-no issues now. 04-07-14 PCR screen negative today.  . Depression   . Anemia     Iron deficinecy anemia  . History of radiation therapy 09/09/13-10/28/13    45 gray to left breast, lumpectomy cavity boosted to 63 gray  . Neuropathy   . Arthritis   . Breast cancer     left ,last radiation 2'15, last chemo 8'14    Past Surgical History  Procedure Laterality Date  . Anal sphincterotomy  04/2011  . Hemorrhoid surgery  04/2011    ligation  . Breast lumpectomy with needle localization Left 02/04/2013    Procedure: LEFT BREAST LUMPECTOMY WITH NEEDLE LOCALIZATION;  Surgeon: Stark Klein, MD;  Location: [facility];  Service: General;  Laterality: Left;  . Axillary lymph node dissection Left 02/04/2013    Procedure: LEFT AXILLARY LYMPH NODE DISSECTION;  Surgeon: Stark Klein, MD;  Location: [facility];  Service: General;  Laterality: Left;  End: 2585  . Portacath placement Right 02/04/2013    Procedure: INSERTION PORT-A-CATH;  Surgeon: Stark Klein, MD;  Location: [facility];  Service: General;  Laterality: Right;  Start Time: 2778.  Marland Kitchen Appendectomy  1980  . Breast surgery      Lumpectomy in april 2014  . Shoulder arthroscopy with rotator cuff repair and subacromial decompression Left 02/24/2014    Procedure: SHOULDER ARTHROSCOPY WITH ROTATOR  CUFF REPAIR AND SUBACROMIAL DECOMPRESSION;  Surgeon: Meredith Pel, MD;  Location: [facility];  Service: Orthopedics;  Laterality: Left;  LEFT SHOULDER DIAGNOSTIC OPERATIVE ARTHROSCOPY, SUBACROMIAL DECOMPRESSION, ROTATOR CUFF TEAR REPAIR  . Port-a-cath removal N/A 04/16/2014    Procedure: REMOVAL PORT-A-CATH;  Surgeon: Stark Klein, MD;  [facility];  Service: General;  Laterality: N/A;    LMP 01/20/2013  Visit Diagnosis:  Balance problems  Lymphedema of breast  Chemotherapy-induced peripheral neuropathy  Functional gait disorder      Subjective Assessment - 12/17/14 1315    Symptoms "I want to try the pump." I feel like the soreness from my Lt breast swelling is spreading to my shoulder, its about a 3/10 pain today and Im really getting frustrated with it. I try the massage but dont get much relief, felt better after you did it last visit.    Currently in Pain? Yes   Pain Score 3    Pain Location Shoulder   Pain Orientation Left   Pain Descriptors / Indicators Constant   Aggravating Factors  Stretching   Pain Relieving Factors Not stretching                    [facility] Adult PT Treatment/Exercise - 12/17/14 0001    Knee/Hip Exercises: Aerobic   Stationary Bike Level 2 x 10 mins  PTA present to  answer questions re: Flexitouch process   Shoulder Exercises: Stretch   Other Shoulder Stretches All stretches from Strength ABC Program: Lt chest, shoulder, and tricep, then bil calf and quadricep, seated hamstring and figure 4 pirirformis stretches.   Manual Therapy   Manual Lymphatic Drainage (MLD) In Supine: Short neck, Rt axilla and Lt inguinal nodes, anterior inter-axillary and Lt axillo-inguinal anastomsis and Lt lateral breast, then Rt S/L for posterior inter-axillary anastomosis.                    Short Term Clinic Goals - 09/25/14 1308    CC Short Term Goal  #1   Title short term goals= long term goals   Time --  4   Period Weeks   Status New              Long Term Clinic Goals - 12/09/14 [phone]    CC Long Term Goal  #3   Title Patient will report 50 percent decrease in feet discomfort symptoms   Period Weeks   CC Long Term Goal  #4   Title pt will report 50% decrease in breast lymphedema symptoms.   Period Weeks   Status On-going   CC Long Term Goal  #5   Title pt will be independent in home exercise program   Time 4   Period Weeks   Status New            Plan - 12/17/14 1348    Clinical Impression Statement Pt with decreased soreness in Lt shoulder after manual lymph drainage. Ms. Montuori is agreeable to trying Flexitouch pump to see if this helps alleviate her breast edema.    Pt will benefit from skilled therapeutic intervention in order to improve on the following deficits Decreased balance;Decreased activity tolerance;Abnormal gait;Increased muscle spasms;Increased edema   Rehab Potential Good   Clinical Impairments Affecting Rehab Potential chemotherapy induced peripheral neuropathy   PT Frequency 2x / week   PT Duration 6 weeks   PT Treatment/Interventions Electrical Stimulation;Moist Heat;Therapeutic exercise;Therapeutic activities;Balance training;Patient/family education;Neuromuscular re-education;Manual lymph drainage   PT Next Visit Plan Finish and review Strength ABC Program if pts foot pain has improved.   Recommended Other Services Faxed pts demographics to [employer] for them to get her a demonstration of the pump.        Problem List Patient Active Problem List   Diagnosis Date Noted  . Shingles 05/15/2014  . Depressed 05/15/2014  . Anxiety 05/15/2014  . Breast pain 05/15/2014  . Rotator cuff tear 02/24/2014  . Hot flashes due to tamoxifen 02/20/2014  . Left shoulder pain with abduction 12/12/2013  . Unspecified hereditary and idiopathic peripheral neuropathy 11/11/2013  . CVA (cerebral infarction) 07/18/2013  . TIA (transient ischemic attack) 07/18/2013  . Contracture of axilla  05/20/2013  . Primary cancer of upper inner quadrant of left female breast 01/24/2013  . Anal fissure 05/03/2011  . Hemorrhoids, internal, with prolapse 05/03/2011    Otelia Limes, PTA 12/17/2014, 1:51 PM  [place], [name], [address] Phone: [phone]   Fax:  [phone]

## 2014-12-22 ENCOUNTER — Ambulatory Visit: Payer: BC Managed Care – PPO

## 2014-12-22 DIAGNOSIS — G622 Polyneuropathy due to other toxic agents: Secondary | ICD-10-CM | POA: Diagnosis not present

## 2014-12-22 DIAGNOSIS — T451X5A Adverse effect of antineoplastic and immunosuppressive drugs, initial encounter: Secondary | ICD-10-CM

## 2014-12-22 DIAGNOSIS — R2689 Other abnormalities of gait and mobility: Secondary | ICD-10-CM

## 2014-12-22 DIAGNOSIS — R269 Unspecified abnormalities of gait and mobility: Secondary | ICD-10-CM

## 2014-12-22 DIAGNOSIS — G620 Drug-induced polyneuropathy: Secondary | ICD-10-CM

## 2014-12-22 DIAGNOSIS — I890 Lymphedema, not elsewhere classified: Secondary | ICD-10-CM

## 2014-12-22 NOTE — Therapy (Signed)
[place], [name], [address] Phone: [phone]   Fax:  [phone]  Physical Therapy Treatment  Patient Details  Name: Kristi [name] MRN: [id number] Date of Birth: 1956/04/26 Referring Provider:  Ron Parker, MD  Encounter Date: 12/22/2014      PT End of Session - 12/22/14 1020    Visit Number 24   Number of Visits 28   PT Start Time 0939   PT Stop Time 1019   PT Time Calculation (min) 40 min      Past Medical History  Diagnosis Date  . Anxiety   . Hypertension   . MRSA (methicillin resistant Staphylococcus aureus) 2009    right groin area-no issues now. 04-07-14 PCR screen negative today.  . Depression   . Anemia     Iron deficinecy anemia  . History of radiation therapy 09/09/13-10/28/13    45 gray to left breast, lumpectomy cavity boosted to 63 gray  . Neuropathy   . Arthritis   . Breast cancer     left ,last radiation 2'15, last chemo 8'14    Past Surgical History  Procedure Laterality Date  . Anal sphincterotomy  04/2011  . Hemorrhoid surgery  04/2011    ligation  . Breast lumpectomy with needle localization Left 02/04/2013    Procedure: LEFT BREAST LUMPECTOMY WITH NEEDLE LOCALIZATION;  Surgeon: Stark Klein, MD;  Location: [facility];  Service: General;  Laterality: Left;  . Axillary lymph node dissection Left 02/04/2013    Procedure: LEFT AXILLARY LYMPH NODE DISSECTION;  Surgeon: Stark Klein, MD;  Location: [facility];  Service: General;  Laterality: Left;  End: 2458  . Portacath placement Right 02/04/2013    Procedure: INSERTION PORT-A-CATH;  Surgeon: Stark Klein, MD;  Location: [facility];  Service: General;  Laterality: Right;  Start Time: 0998.  Marland Kitchen Appendectomy  1980  . Breast surgery      Lumpectomy in april 2014  . Shoulder arthroscopy with rotator cuff repair and subacromial decompression Left 02/24/2014    Procedure: SHOULDER ARTHROSCOPY WITH ROTATOR CUFF REPAIR AND SUBACROMIAL  DECOMPRESSION;  Surgeon: Meredith Pel, MD;  Location: [facility];  Service: Orthopedics;  Laterality: Left;  LEFT SHOULDER DIAGNOSTIC OPERATIVE ARTHROSCOPY, SUBACROMIAL DECOMPRESSION, ROTATOR CUFF TEAR REPAIR  . Port-a-cath removal N/A 04/16/2014    Procedure: REMOVAL PORT-A-CATH;  Surgeon: Stark Klein, MD;  [facility];  Service: General;  Laterality: N/A;    LMP 01/20/2013  Visit Diagnosis:  Balance problems  Lymphedema of breast  Chemotherapy-induced peripheral neuropathy  Functional gait disorder      Subjective Assessment - 12/22/14 0940    Symptoms Feet feel good today, my Lt great toe is still just a little sore from last week.    Currently in Pain? No/denies                    [facility] Adult PT Treatment/Exercise - 12/22/14 0001    Shoulder Exercises: Stretch   Other Shoulder Stretches All stretches from Strength ABC Program: Lt chest, shoulder, and tricep, then bil calf and quadricep, seated hamstring and figure 4 pirirformis stretches, and supine bil low trunk rotation..   Other Shoulder Stretches All strength from Strength ABC Program: Supine for Bridging, bil S/L clamshell, quadruped alternating UE flexion, then alternating LE extension, and chest press 2#, then standing for squats 2#, standing "W" against wall 2#, bil hip abd, scaption 2#, and 6" step ups all 10 reps. Cuing for technique, stopped due  to time.                 PT Education - 12/22/14 1019    Education provided Yes   Education Details Reissued strength ABC program packet and log as pt couldnt find hers.   Person(s) Educated Patient   Methods Explanation;Demonstration;Tactile cues;Verbal cues;Handout   Comprehension Verbalized understanding;Returned demonstration;Verbal cues required;Need further instruction           Short Term Clinic Goals - 09/25/14 1308    CC Short Term Goal  #1   Title short term goals= long term goals   Time --  4   Period Weeks   Status New              Long Term Clinic Goals - 12/09/14 1224    CC Long Term Goal  #3   Title Patient will report 50 percent decrease in feet discomfort symptoms   Period Weeks   CC Long Term Goal  #4   Title pt will report 50% decrease in breast lymphedema symptoms.   Period Weeks   Status On-going   CC Long Term Goal  #5   Title pt will be independent in home exercise program   Time 4   Period Weeks   Status New            Plan - 12/22/14 1020    Clinical Impression Statement Pt able to tolerate all exercises today without c/o toe pain. Struggles with correct technique with some of exercises as pt gets easily confused at times, but able to return correct demo after instruction.   Pt will benefit from skilled therapeutic intervention in order to improve on the following deficits Decreased balance;Decreased activity tolerance;Abnormal gait;Increased muscle spasms;Increased edema   Rehab Potential Good   Clinical Impairments Affecting Rehab Potential chemotherapy induced peripheral neuropathy   PT Frequency 2x / week   PT Duration 6 weeks   PT Next Visit Plan Finish and review Strength ABC Program.        Problem List Patient Active Problem List   Diagnosis Date Noted  . Shingles 05/15/2014  . Depressed 05/15/2014  . Anxiety 05/15/2014  . Breast pain 05/15/2014  . Rotator cuff tear 02/24/2014  . Hot flashes due to tamoxifen 02/20/2014  . Left shoulder pain with abduction 12/12/2013  . Unspecified hereditary and idiopathic peripheral neuropathy 11/11/2013  . CVA (cerebral infarction) 07/18/2013  . TIA (transient ischemic attack) 07/18/2013  . Contracture of axilla 05/20/2013  . Primary cancer of upper inner quadrant of left female breast 01/24/2013  . Anal fissure 05/03/2011  . Hemorrhoids, internal, with prolapse 05/03/2011    Otelia Limes, PTA 12/22/2014, 10:22 AM  [place], [name], [address] Phone: [phone]   Fax:  [phone]

## 2014-12-23 ENCOUNTER — Other Ambulatory Visit: Payer: Self-pay | Admitting: *Deleted

## 2014-12-23 DIAGNOSIS — C50212 Malignant neoplasm of upper-inner quadrant of left female breast: Secondary | ICD-10-CM

## 2014-12-23 MED ORDER — GABAPENTIN 800 MG PO TABS: 800.0000 mg | ORAL_TABLET | Freq: Three times a day (TID) | ORAL | Status: DC

## 2014-12-25 ENCOUNTER — Ambulatory Visit: Payer: BC Managed Care – PPO | Admitting: Physical Therapy

## 2014-12-25 DIAGNOSIS — R269 Unspecified abnormalities of gait and mobility: Secondary | ICD-10-CM

## 2014-12-25 DIAGNOSIS — T451X5A Adverse effect of antineoplastic and immunosuppressive drugs, initial encounter: Secondary | ICD-10-CM

## 2014-12-25 DIAGNOSIS — R2689 Other abnormalities of gait and mobility: Secondary | ICD-10-CM

## 2014-12-25 DIAGNOSIS — I890 Lymphedema, not elsewhere classified: Secondary | ICD-10-CM

## 2014-12-25 DIAGNOSIS — G622 Polyneuropathy due to other toxic agents: Secondary | ICD-10-CM | POA: Diagnosis not present

## 2014-12-25 DIAGNOSIS — G620 Drug-induced polyneuropathy: Secondary | ICD-10-CM

## 2014-12-25 NOTE — Therapy (Signed)
[place], [name], [address] Phone: [phone]   Fax:  [phone]  Physical Therapy Treatment  Patient Details  Name: Kristi [name] MRN: [id number] Date of Birth: Oct 30, 1956 Referring Provider:  Ron Parker, MD  Encounter Date: 12/25/2014      PT End of Session - 12/25/14 1231    Visit Number 25   Number of Visits 28   Date for PT Re-Evaluation 01/01/15   PT Start Time 0845   PT Stop Time 0930   PT Time Calculation (min) 45 min      Past Medical History  Diagnosis Date  . Anxiety   . Hypertension   . MRSA (methicillin resistant Staphylococcus aureus) 2009    right groin area-no issues now. 04-07-14 PCR screen negative today.  . Depression   . Anemia     Iron deficinecy anemia  . History of radiation therapy 09/09/13-10/28/13    45 gray to left breast, lumpectomy cavity boosted to 63 gray  . Neuropathy   . Arthritis   . Breast cancer     left ,last radiation 2'15, last chemo 8'14    Past Surgical History  Procedure Laterality Date  . Anal sphincterotomy  04/2011  . Hemorrhoid surgery  04/2011    ligation  . Breast lumpectomy with needle localization Left 02/04/2013    Procedure: LEFT BREAST LUMPECTOMY WITH NEEDLE LOCALIZATION;  Surgeon: Stark Klein, MD;  Location: [facility];  Service: General;  Laterality: Left;  . Axillary lymph node dissection Left 02/04/2013    Procedure: LEFT AXILLARY LYMPH NODE DISSECTION;  Surgeon: Stark Klein, MD;  Location: [facility];  Service: General;  Laterality: Left;  End: 6962  . Portacath placement Right 02/04/2013    Procedure: INSERTION PORT-A-CATH;  Surgeon: Stark Klein, MD;  Location: [facility];  Service: General;  Laterality: Right;  Start Time: 9528.  Marland Kitchen Appendectomy  1980  . Breast surgery      Lumpectomy in april 2014  . Shoulder arthroscopy with rotator cuff repair and subacromial decompression Left 02/24/2014    Procedure: SHOULDER ARTHROSCOPY WITH ROTATOR  CUFF REPAIR AND SUBACROMIAL DECOMPRESSION;  Surgeon: Meredith Pel, MD;  Location: [facility];  Service: Orthopedics;  Laterality: Left;  LEFT SHOULDER DIAGNOSTIC OPERATIVE ARTHROSCOPY, SUBACROMIAL DECOMPRESSION, ROTATOR CUFF TEAR REPAIR  . Port-a-cath removal N/A 04/16/2014    Procedure: REMOVAL PORT-A-CATH;  Surgeon: Stark Klein, MD;  [facility];  Service: General;  Laterality: N/A;    LMP 01/20/2013  Visit Diagnosis:  Balance problems  Lymphedema of breast  Chemotherapy-induced peripheral neuropathy  Functional gait disorder      Subjective Assessment - 12/25/14 0857    Symptoms " I thnk they are better " ( regarding toenails)  "I go to see Dr. Ace Gins on Tuesday"  there is a possiblity for a new treatment called "the scrambler"  to help with neuropathy.  pt is curious to see if that will help her.    Currently in Pain? Yes   Pain Score 2    Pain Location Scapula   Pain Orientation Left;Posterior   Pain Descriptors / Indicators Sore                    [facility] Adult PT Treatment/Exercise - 12/25/14 0900    Knee/Hip Exercises: Aerobic   Stationary Bike Level 2 x 10 mins  PTA present to answer questions re: Flexitouch process   Knee/Hip Exercises: Standing   Heel Raises 2 sets;5 reps  Forward Step Up Both;10 reps;Step Height: 6"   Functional Squat 2 sets;10 reps   Gait Training h   Other Standing Knee Exercises standing hip abduction 1 set of 10 with each leg   Other Standing Knee Exercises hip extension 10 reps with each leg leaning forward with arms on armrests of chair   Knee/Hip Exercises: Supine   Bridges 10 reps   Shoulder Exercises: Supine   Other Supine Exercises chest press 2 sets of 10 with 3 #   Shoulder Exercises: Standing   Other Standing Exercises scaption  2 sets of 10 with 1#    Other Standing Exercises bicep curls 2 sets of 10 with 3 #    Shoulder Exercises: ROM/Strengthening   "W" Arms 2 sets of q10 with no weight   Other  ROM/Strengthening Exercises tricep kickback 2 sets of 10 with 1#    Shoulder Exercises: Stretch   Other Shoulder Stretches All stretches from Strength ABC Program: Lt chest, shoulder, and tricep, then bil calf and quadricep, seated hamstring and figure 4 pirirformis stretches.             PT Education - 12/25/14 1230    Education Details reviewed Strength ABC and logging   Person(s) Educated Patient   Methods Explanation;Demonstration;Verbal cues   Comprehension Need further instruction;Verbal cues required           Short Term Clinic Goals - 09/25/14 1308    CC Short Term Goal  #1   Title short term goals= long term goals   Time --  4   Period Weeks   Status New             Long Term Clinic Goals - 12/09/14 7416    CC Long Term Goal  #3   Title Patient will report 50 percent decrease in feet discomfort symptoms   Period Weeks   CC Long Term Goal  #4   Title pt will report 50% decrease in breast lymphedema symptoms.   Period Weeks   Status On-going   CC Long Term Goal  #5   Title pt will be independent in home exercise program   Time 4   Period Weeks   Status New            Plan - 12/25/14 1232    Clinical Impression Statement Pt brought back her log and packet today.  She did exercises and completed log with intermittent verbal and tactile cues. Pt says she felt better after exercise session   PT Next Visit Plan Strength ABC Program and logging with diminishing cues and progressing weights as able        Problem List Patient Active Problem List   Diagnosis Date Noted  . Shingles 05/15/2014  . Depressed 05/15/2014  . Anxiety 05/15/2014  . Breast pain 05/15/2014  . Rotator cuff tear 02/24/2014  . Hot flashes due to tamoxifen 02/20/2014  . Left shoulder pain with abduction 12/12/2013  . Unspecified hereditary and idiopathic peripheral neuropathy 11/11/2013  . CVA (cerebral infarction) 07/18/2013  . TIA (transient ischemic attack) 07/18/2013   . Contracture of axilla 05/20/2013  . Primary cancer of upper inner quadrant of left female breast 01/24/2013  . Anal fissure 05/03/2011  . Hemorrhoids, internal, with prolapse 05/03/2011   Donato Heinz. Owens Shark, PT    12/25/2014, 12:42 PM  [facility] [place], [name], [address] Phone: [phone]   Fax:  [phone]

## 2014-12-28 ENCOUNTER — Ambulatory Visit: Payer: BC Managed Care – PPO | Admitting: Physical Therapy

## 2014-12-28 DIAGNOSIS — G622 Polyneuropathy due to other toxic agents: Secondary | ICD-10-CM | POA: Diagnosis not present

## 2014-12-28 DIAGNOSIS — R2689 Other abnormalities of gait and mobility: Secondary | ICD-10-CM

## 2014-12-28 DIAGNOSIS — I890 Lymphedema, not elsewhere classified: Secondary | ICD-10-CM

## 2014-12-28 DIAGNOSIS — R269 Unspecified abnormalities of gait and mobility: Secondary | ICD-10-CM

## 2014-12-28 NOTE — Therapy (Signed)
[place], [name], [address] Phone: [phone]   Fax:  [phone]  Physical Therapy Treatment  Patient Details  Name: Kristi [name] MRN: [id number] Date of Birth: December 16, 1955 Referring Provider:  Ron Parker, MD  Encounter Date: 12/28/2014      PT End of Session - 12/28/14 1641    Visit Number 26   Number of Visits 28   Date for PT Re-Evaluation 01/01/15   PT Start Time 1300   PT Stop Time 1345   PT Time Calculation (min) 45 min      Past Medical History  Diagnosis Date  . Anxiety   . Hypertension   . MRSA (methicillin resistant Staphylococcus aureus) 2009    right groin area-no issues now. 04-07-14 PCR screen negative today.  . Depression   . Anemia     Iron deficinecy anemia  . History of radiation therapy 09/09/13-10/28/13    45 gray to left breast, lumpectomy cavity boosted to 63 gray  . Neuropathy   . Arthritis   . Breast cancer     left ,last radiation 2'15, last chemo 8'14    Past Surgical History  Procedure Laterality Date  . Anal sphincterotomy  04/2011  . Hemorrhoid surgery  04/2011    ligation  . Breast lumpectomy with needle localization Left 02/04/2013    Procedure: LEFT BREAST LUMPECTOMY WITH NEEDLE LOCALIZATION;  Surgeon: Stark Klein, MD;  Location: [facility];  Service: General;  Laterality: Left;  . Axillary lymph node dissection Left 02/04/2013    Procedure: LEFT AXILLARY LYMPH NODE DISSECTION;  Surgeon: Stark Klein, MD;  Location: [facility];  Service: General;  Laterality: Left;  End: 5462  . Portacath placement Right 02/04/2013    Procedure: INSERTION PORT-A-CATH;  Surgeon: Stark Klein, MD;  Location: [facility];  Service: General;  Laterality: Right;  Start Time: 7035.  Marland Kitchen Appendectomy  1980  . Breast surgery      Lumpectomy in april 2014  . Shoulder arthroscopy with rotator cuff repair and subacromial decompression Left 02/24/2014    Procedure: SHOULDER ARTHROSCOPY WITH ROTATOR  CUFF REPAIR AND SUBACROMIAL DECOMPRESSION;  Surgeon: Meredith Pel, MD;  Location: [facility];  Service: Orthopedics;  Laterality: Left;  LEFT SHOULDER DIAGNOSTIC OPERATIVE ARTHROSCOPY, SUBACROMIAL DECOMPRESSION, ROTATOR CUFF TEAR REPAIR  . Port-a-cath removal N/A 04/16/2014    Procedure: REMOVAL PORT-A-CATH;  Surgeon: Stark Klein, MD;  [facility];  Service: General;  Laterality: N/A;    LMP 01/20/2013  Visit Diagnosis:  Balance problems  Lymphedema of breast  Functional gait disorder      Subjective Assessment - 12/28/14 1307    Symptoms "Gay Filler called my today" She will have a trial of Flexitouch on Wednesday. She is still wearing slippers but states her feet are not hurting  she is still unable to wear shoes.  She goes to see Dr. Ace Gins in the [facility].    Currently in Pain? No/denies                    [facility] Adult PT Treatment/Exercise - 12/28/14 0001    Knee/Hip Exercises: Aerobic   Stationary Bike Level 2 x 66mins  PTA present to answer questions re: Flexitouch process   Knee/Hip Exercises: Standing   Heel Raises 2 sets;5 reps   Forward Step Up Both;10 reps;Step Height: 6"   Functional Squat 2 sets;10 reps   Gait Training h   Other Standing Knee Exercises standing hip abduction 1 set  of 10 with each leg   Other Standing Knee Exercises hip extension 10 reps with each leg leaning forward with arms on armrests of chair   Knee/Hip Exercises: Supine   Bridges 10 reps   Shoulder Exercises: Supine   Other Supine Exercises chest press 2 sets of 10 with 3 #   Shoulder Exercises: Standing   Other Standing Exercises scaption  2 sets of 10 with 1#    Other Standing Exercises bicep curls 2 sets of 10 with 3 #    Shoulder Exercises: ROM/Strengthening   "W" Arms 2 sets of q10 with no weight   Other ROM/Strengthening Exercises tricep kickback 2 sets of 10 with 1#    Shoulder Exercises: Stretch   Other Shoulder Stretches All stretches from Strength ABC Program: Lt  chest, shoulder, and tricep, then bil calf and quadricep, seated hamstring and figure 4 pirirformis stretches.           PT Education - 12/28/14 1641    Education provided Yes   Education Details reviewed strength ABC and logging   Person(s) Educated Patient   Methods Verbal cues   Comprehension Need further instruction           Short Term Clinic Goals - 12/28/14 1645    CC Short Term Goal  #1   Status On-going             [facility] Goals - 12/28/14 1645    CC Long Term Goal  #4   Title pt will report 50% decrease in breast lymphedema symptoms.   Status On-going   CC Long Term Goal  #5   Title pt will be independent in home exercise program   Status On-going            Plan - 12/28/14 1642    Clinical Impression Statement Pt comes back with log and packet today.  She needed fewer cues, but still needed some.  Overall, she is improving in exercise performance and technique and says she has less pain   PT Next Visit Plan Strength ABC Program and logging with diminishing cues and progressing weights as able         Problem List Patient Active Problem List   Diagnosis Date Noted  . Shingles 05/15/2014  . Depressed 05/15/2014  . Anxiety 05/15/2014  . Breast pain 05/15/2014  . Rotator cuff tear 02/24/2014  . Hot flashes due to tamoxifen 02/20/2014  . Left shoulder pain with abduction 12/12/2013  . Unspecified hereditary and idiopathic peripheral neuropathy 11/11/2013  . CVA (cerebral infarction) 07/18/2013  . TIA (transient ischemic attack) 07/18/2013  . Contracture of axilla 05/20/2013  . Primary cancer of upper inner quadrant of left female breast 01/24/2013  . Anal fissure 05/03/2011  . Hemorrhoids, internal, with prolapse 05/03/2011   Donato Heinz. Owens Shark, PT   12/28/2014, 4:46 PM  [place], [name], [address] Phone: [phone]   Fax:  [phone]

## 2014-12-30 ENCOUNTER — Ambulatory Visit: Payer: BC Managed Care – PPO | Admitting: Physical Therapy

## 2014-12-31 ENCOUNTER — Ambulatory Visit: Payer: BC Managed Care – PPO | Admitting: Physical Therapy

## 2014-12-31 DIAGNOSIS — R269 Unspecified abnormalities of gait and mobility: Secondary | ICD-10-CM

## 2014-12-31 DIAGNOSIS — G622 Polyneuropathy due to other toxic agents: Secondary | ICD-10-CM | POA: Diagnosis not present

## 2014-12-31 DIAGNOSIS — I890 Lymphedema, not elsewhere classified: Secondary | ICD-10-CM

## 2014-12-31 NOTE — Therapy (Signed)
[place], [name], [address] Phone: [phone]   Fax:  [phone]  Physical Therapy Treatment  Patient Details  Name: Kristi [name] MRN: [id number] Date of Birth: Jul 08, 1956 Referring Provider:  Ron Parker, MD  Encounter Date: 12/31/2014      PT End of Session - 12/31/14 1434    Visit Number 27   Number of Visits 31   Date for PT Re-Evaluation 01/15/15   PT Start Time 0935   PT Stop Time 1020   PT Time Calculation (min) 45 min   Activity Tolerance Patient tolerated treatment well   Behavior During Therapy [facility] for tasks assessed/performed      Past Medical History  Diagnosis Date  . Anxiety   . Hypertension   . MRSA (methicillin resistant Staphylococcus aureus) 2009    right groin area-no issues now. 04-07-14 PCR screen negative today.  . Depression   . Anemia     Iron deficinecy anemia  . History of radiation therapy 09/09/13-10/28/13    45 gray to left breast, lumpectomy cavity boosted to 63 gray  . Neuropathy   . Arthritis   . Breast cancer     left ,last radiation 2'15, last chemo 8'14    Past Surgical History  Procedure Laterality Date  . Anal sphincterotomy  04/2011  . Hemorrhoid surgery  04/2011    ligation  . Breast lumpectomy with needle localization Left 02/04/2013    Procedure: LEFT BREAST LUMPECTOMY WITH NEEDLE LOCALIZATION;  Surgeon: Stark Klein, MD;  Location: [facility];  Service: General;  Laterality: Left;  . Axillary lymph node dissection Left 02/04/2013    Procedure: LEFT AXILLARY LYMPH NODE DISSECTION;  Surgeon: Stark Klein, MD;  Location: [facility];  Service: General;  Laterality: Left;  End: 8938  . Portacath placement Right 02/04/2013    Procedure: INSERTION PORT-A-CATH;  Surgeon: Stark Klein, MD;  Location: [facility];  Service: General;  Laterality: Right;  Start Time: 1017.  Marland Kitchen Appendectomy  1980  . Breast surgery      Lumpectomy in april 2014  . Shoulder arthroscopy with  rotator cuff repair and subacromial decompression Left 02/24/2014    Procedure: SHOULDER ARTHROSCOPY WITH ROTATOR CUFF REPAIR AND SUBACROMIAL DECOMPRESSION;  Surgeon: Meredith Pel, MD;  Location: [facility];  Service: Orthopedics;  Laterality: Left;  LEFT SHOULDER DIAGNOSTIC OPERATIVE ARTHROSCOPY, SUBACROMIAL DECOMPRESSION, ROTATOR CUFF TEAR REPAIR  . Port-a-cath removal N/A 04/16/2014    Procedure: REMOVAL PORT-A-CATH;  Surgeon: Stark Klein, MD;  [facility];  Service: General;  Laterality: N/A;    LMP 01/20/2013  Visit Diagnosis:  Lymphedema of breast - Plan: PT plan of care cert/re-cert  Functional gait disorder - Plan: PT plan of care cert/re-cert      Subjective Assessment - 12/31/14 0938    Symptoms Flexitouch trial yesterday did help.  "I'm not sure how long it will help."   Currently in Pain? Yes   Pain Score 5    Pain Location Toe (Comment which one)   Pain Orientation Right;Left   Pain Type Neuropathic pain   Aggravating Factors  tight shoes   Pain Relieving Factors elevating feet, pain meds, TENS may help a little                    [facility] Adult PT Treatment/Exercise - 12/31/14 0001    Knee/Hip Exercises: Aerobic   Stationary Bike 8 mins., level 2   Shoulder Exercises: Stretch   Other Shoulder  Stretches All stretches from Strength ABC Program: chest, shoulder, and tricep, then bil calf and quadricep, seated hamstring and figure 4 pirirformis stretches.   Other Shoulder Stretches Strength ABC strengthening including core (bridge, clam, and modified superwoman) and resistance training including chest press 3#, squats, standing "W", standing side leg lift, scaption against wall 1#, and bicep curl at wall 3#, all x 10 x 2.                   Short Term Clinic Goals - 12/28/14 1645    CC Short Term Goal  #1   Status On-going             Long Term Clinic Goals - 12/28/14 1645    CC Long Term Goal  #4   Title pt will report 50% decrease in  breast lymphedema symptoms.   Status On-going   CC Long Term Goal  #5   Title pt will be independent in home exercise program   Status On-going            Plan - 12/31/14 1435    Clinical Impression Statement Pt. again came back with log and packet, and needed some cueing for exercise, though is generally using good form when she performs them.  Unable to get through full program today, so patient was going to finish after she got home after session this morning.   Pt will benefit from skilled therapeutic intervention in order to improve on the following deficits Decreased range of motion;Decreased strength;Increased edema;Decreased activity tolerance;Decreased balance   Rehab Potential Good   Clinical Impairments Affecting Rehab Potential chemotherapy induced peripheral neuropathy   PT Frequency 2x / week   PT Duration 2 weeks   PT Treatment/Interventions Therapeutic exercise   PT Next Visit Plan Strength ABC Program and logging with diminishing cues and progressing weights as able    Consulted and Agree with Plan of Care Patient     Continue two more weeks to ensure that patient is safe and independent with strength ABC program and follow-up exercise plan.   Problem List Patient Active Problem List   Diagnosis Date Noted  . Shingles 05/15/2014  . Depressed 05/15/2014  . Anxiety 05/15/2014  . Breast pain 05/15/2014  . Rotator cuff tear 02/24/2014  . Hot flashes due to tamoxifen 02/20/2014  . Left shoulder pain with abduction 12/12/2013  . Unspecified hereditary and idiopathic peripheral neuropathy 11/11/2013  . CVA (cerebral infarction) 07/18/2013  . TIA (transient ischemic attack) 07/18/2013  . Contracture of axilla 05/20/2013  . Primary cancer of upper inner quadrant of left female breast 01/24/2013  . Anal fissure 05/03/2011  . Hemorrhoids, internal, with prolapse 05/03/2011    Tanith Dagostino 12/31/2014, 2:41 PM  [place], [name], [address] Phone: [phone]   Fax:  [phone]  Serafina Royals, [place]

## 2015-01-05 ENCOUNTER — Ambulatory Visit: Payer: BC Managed Care – PPO | Attending: Orthopedic Surgery | Admitting: Physical Therapy

## 2015-01-05 DIAGNOSIS — R29818 Other symptoms and signs involving the nervous system: Secondary | ICD-10-CM | POA: Insufficient documentation

## 2015-01-05 DIAGNOSIS — G622 Polyneuropathy due to other toxic agents: Secondary | ICD-10-CM | POA: Diagnosis not present

## 2015-01-05 DIAGNOSIS — I890 Lymphedema, not elsewhere classified: Secondary | ICD-10-CM | POA: Diagnosis present

## 2015-01-05 DIAGNOSIS — R269 Unspecified abnormalities of gait and mobility: Secondary | ICD-10-CM | POA: Insufficient documentation

## 2015-01-05 DIAGNOSIS — R2689 Other abnormalities of gait and mobility: Secondary | ICD-10-CM

## 2015-01-05 NOTE — Therapy (Signed)
[place], [name], [address] Phone: [phone]   Fax:  [phone]  Physical Therapy Treatment  Patient Details  Name: Kristi [name] MRN: [id number] Date of Birth: [birth year] Referring Provider:  Rulon Eisenmenger, MD  Encounter Date: 01/05/2015      PT End of Session - 01/05/15 1755    Visit Number 28   Number of Visits 31   Date for PT Re-Evaluation 01/15/15      Past Medical History  Diagnosis Date  . Anxiety   . Hypertension   . MRSA (methicillin resistant Staphylococcus aureus) 2009    right groin area-no issues now. 04-07-14 PCR screen negative today.  . Depression   . Anemia     Iron deficinecy anemia  . History of radiation therapy 09/09/13-10/28/13    45 gray to left breast, lumpectomy cavity boosted to 63 gray  . Neuropathy   . Arthritis   . Breast cancer     left ,last radiation 2'15, last chemo 8'14    Past Surgical History  Procedure Laterality Date  . Anal sphincterotomy  04/2011  . Hemorrhoid surgery  04/2011    ligation  . Breast lumpectomy with needle localization Left 02/04/2013    Procedure: LEFT BREAST LUMPECTOMY WITH NEEDLE LOCALIZATION;  Surgeon: Stark Klein, MD;  Location: [facility];  Service: General;  Laterality: Left;  . Axillary lymph node dissection Left 02/04/2013    Procedure: LEFT AXILLARY LYMPH NODE DISSECTION;  Surgeon: Stark Klein, MD;  Location: [facility];  Service: General;  Laterality: Left;  End: 6160  . Portacath placement Right 02/04/2013    Procedure: INSERTION PORT-A-CATH;  Surgeon: Stark Klein, MD;  Location: [facility];  Service: General;  Laterality: Right;  Start Time: 7371.  Marland Kitchen Appendectomy  1980  . Breast surgery      Lumpectomy in april 2014  . Shoulder arthroscopy with rotator cuff repair and subacromial decompression Left 02/24/2014    Procedure: SHOULDER ARTHROSCOPY WITH ROTATOR CUFF REPAIR AND SUBACROMIAL DECOMPRESSION;  Surgeon: Meredith Pel, MD;   Location: [facility];  Service: Orthopedics;  Laterality: Left;  LEFT SHOULDER DIAGNOSTIC OPERATIVE ARTHROSCOPY, SUBACROMIAL DECOMPRESSION, ROTATOR CUFF TEAR REPAIR  . Port-a-cath removal N/A 04/16/2014    Procedure: REMOVAL PORT-A-CATH;  Surgeon: Stark Klein, MD;  [facility];  Service: General;  Laterality: N/A;    LMP 01/20/2013  Visit Diagnosis:  Functional gait disorder  Balance problems      Subjective Assessment - 01/05/15 0857    Symptoms "I went over yesterday and I'll start on March 14 or 15"  Regarding the LiveStrong program at the [facility].  She has to get a shoe that will have toes covered so that she can participate.  She has been apporved for the Flexitouch 60 day trial, so it will be sent to her and a trainer will come out to set it up for her.    Currently in Pain? Yes   Pain Score 1   its a little bit sore. in right shoulder                     [facility] Adult PT Treatment/Exercise - 01/05/15 0001    Elbow Exercises   Elbow Extension Strengthening;20 reps;Both;Bar weights/barbell  3#   Lumbar Exercises: Supine   Bridge 10 reps   Lumbar Exercises: Sidelying   Clam 20 reps   Knee/Hip Exercises: Aerobic   Stationary Bike 10 minutes level 3   Knee/Hip  Exercises: Standing   Forward Step Up Both;Step Height: 8";20 reps  leading with each leg for 10 reps    Functional Squat 2 sets;10 reps   Other Standing Knee Exercises standing hip abduction 1 set of 10 with each leg   Other Standing Knee Exercises hip extension 10 reps with each leg leaning forward with arms on armrests of chair   Shoulder Exercises: Supine   Other Supine Exercises chest press 2 sets of 10 with 3 #   Shoulder Exercises: Standing   Other Standing Exercises scaption  2 sets of 10 with 1#    Other Standing Exercises bicep curls 2 sets of 10 with 3 #                 PT Education - 01/05/15 2440    Education provided Yes   Education Details reviewd strength ABC and logging    Person(s) Educated Patient   Methods Verbal cues   Comprehension Need further instruction           Short Term Clinic Goals - 12/28/14 1645    CC Short Term Goal  #1   Status On-going             [facility] Term Clinic Goals - 12/28/14 1645    CC Long Term Goal  #4   Title pt will report 50% decrease in breast lymphedema symptoms.   Status On-going   CC Long Term Goal  #5   Title pt will be independent in home exercise program   Status On-going            Plan - 01/05/15 1755    Clinical Impression Statement Pt is improving in exercise performance and tolerance.  She was able to remember how to do most of the exercise and needed only rare cues. She required only one rest break. She will be ready to move to Live Strong program on March 15   PT Next Visit Plan Strength ABC Program and logging with diminishing cues and progressing weights as able         Problem List Patient Active Problem List   Diagnosis Date Noted  . Shingles 05/15/2014  . Depressed 05/15/2014  . Anxiety 05/15/2014  . Breast pain 05/15/2014  . Rotator cuff tear 02/24/2014  . Hot flashes due to tamoxifen 02/20/2014  . Left shoulder pain with abduction 12/12/2013  . Unspecified hereditary and idiopathic peripheral neuropathy 11/11/2013  . CVA (cerebral infarction) 07/18/2013  . TIA (transient ischemic attack) 07/18/2013  . Contracture of axilla 05/20/2013  . Primary cancer of upper inner quadrant of left female breast 01/24/2013  . Anal fissure 05/03/2011  . Hemorrhoids, internal, with prolapse 05/03/2011   Donato Heinz. Owens Shark PT   [name] 01/05/2015, 5:58 PM  [place], [name], [address] Phone: [phone]   Fax:  [phone]

## 2015-01-07 ENCOUNTER — Ambulatory Visit: Payer: BC Managed Care – PPO

## 2015-01-12 ENCOUNTER — Ambulatory Visit: Payer: BC Managed Care – PPO

## 2015-01-12 DIAGNOSIS — R2689 Other abnormalities of gait and mobility: Secondary | ICD-10-CM

## 2015-01-12 DIAGNOSIS — I890 Lymphedema, not elsewhere classified: Secondary | ICD-10-CM

## 2015-01-12 DIAGNOSIS — G622 Polyneuropathy due to other toxic agents: Secondary | ICD-10-CM | POA: Diagnosis not present

## 2015-01-12 DIAGNOSIS — R269 Unspecified abnormalities of gait and mobility: Secondary | ICD-10-CM

## 2015-01-12 DIAGNOSIS — G620 Drug-induced polyneuropathy: Secondary | ICD-10-CM

## 2015-01-12 DIAGNOSIS — T451X5A Adverse effect of antineoplastic and immunosuppressive drugs, initial encounter: Secondary | ICD-10-CM

## 2015-01-12 NOTE — Therapy (Signed)
[place], [name], [address] Phone: [phone]   Fax:  [phone]  Physical Therapy Treatment  Patient Details  Name: Kristi [name] MRN: [id number] Date of Birth: February 12, 1956 Referring Provider:  Larina Earthly, MD  Encounter Date: 01/12/2015      PT End of Session - 01/12/15 1153    Visit Number 29   Number of Visits 31   Date for PT Re-Evaluation 01/15/15   PT Start Time 1109   PT Stop Time 1155   PT Time Calculation (min) 46 min      Past Medical History  Diagnosis Date  . Anxiety   . Hypertension   . MRSA (methicillin resistant Staphylococcus aureus) 2009    right groin area-no issues now. 04-07-14 PCR screen negative today.  . Depression   . Anemia     Iron deficinecy anemia  . History of radiation therapy 09/09/13-10/28/13    45 gray to left breast, lumpectomy cavity boosted to 63 gray  . Neuropathy   . Arthritis   . Breast cancer     left ,last radiation 2'15, last chemo 8'14    Past Surgical History  Procedure Laterality Date  . Anal sphincterotomy  04/2011  . Hemorrhoid surgery  04/2011    ligation  . Breast lumpectomy with needle localization Left 02/04/2013    Procedure: LEFT BREAST LUMPECTOMY WITH NEEDLE LOCALIZATION;  Surgeon: Stark Klein, MD;  Location: [place];  Service: General;  Laterality: Left;  . Axillary lymph node dissection Left 02/04/2013    Procedure: LEFT AXILLARY LYMPH NODE DISSECTION;  Surgeon: Stark Klein, MD;  Location: [place];  Service: General;  Laterality: Left;  End: 6962  . Portacath placement Right 02/04/2013    Procedure: INSERTION PORT-A-CATH;  Surgeon: Stark Klein, MD;  Location: [place];  Service: General;  Laterality: Right;  Start Time: 9528.  Marland Kitchen Appendectomy  1980  . Breast surgery      Lumpectomy in april 2014  . Shoulder arthroscopy with rotator cuff repair and subacromial decompression Left 02/24/2014    Procedure: SHOULDER ARTHROSCOPY WITH ROTATOR  CUFF REPAIR AND SUBACROMIAL DECOMPRESSION;  Surgeon: Meredith Pel, MD;  Location: [place];  Service: Orthopedics;  Laterality: Left;  LEFT SHOULDER DIAGNOSTIC OPERATIVE ARTHROSCOPY, SUBACROMIAL DECOMPRESSION, ROTATOR CUFF TEAR REPAIR  . Port-a-cath removal N/A 04/16/2014    Procedure: REMOVAL PORT-A-CATH;  Surgeon: Stark Klein, MD;  [facility];  Service: General;  Laterality: N/A;    LMP 01/20/2013  Visit Diagnosis:  Functional gait disorder  Balance problems  Lymphedema of breast  Chemotherapy-induced peripheral neuropathy      Subjective Assessment - 01/12/15 1114    Symptoms Brought my log sheet back, been doing the exercises at home. Went out of town and didnt get to do the whole packet but did some of what I could remember. Start the [employer] at the [facility] on Monday so this is my last week here.   Currently in Pain? No/denies                    [facility] Adult PT Treatment/Exercise - 01/12/15 0001    Knee/Hip Exercises: Aerobic   Stationary Bike Level 3 5 minutes   Shoulder Exercises: ROM/Strengthening   Other ROM/Strengthening Exercises Reviewed Stretches from Strength ABC Program: Bil chest, shoulder, tricep, calf, quadriceps, seated piriformis figure 4 and hamstring stretches, then supine for low trunk rotation all 1 rep and 15 sec holds each.    Other  ROM/Strengthening Exercises Strength from Strength ABC Program: Supine Bridging and chest presses 3 lbs, bil S/L for clam, then standing with hands on lowered mat table for alternating LE extension, squats holding 2 lbs, Standing "W" against wall with cuing for technique, fingertips on wall for bil hip abduction, scaption against wall 2 lbs 2 sets, 8" step ups 15 each, tricep kickbacks 3 lbs, calf raises holding 3 lbs 20 reps, and bicep curls 3 lbs 20 reps: all 10 reps each unless otherwise specified.                PT Education - 01/12/15 1153    Education provided Yes   Education Details  Reviewed Strength ABC Program and logging   Person(s) Educated Patient   Methods Verbal cues;Demonstration   Comprehension Returned demonstration           Short Term Clinic Goals - 12/28/14 1645    CC Short Term Goal  #1   Status On-going             Long Term Clinic Goals - 12/28/14 1645    CC Long Term Goal  #4   Title pt will report 50% decrease in breast lymphedema symptoms.   Status On-going   CC Long Term Goal  #5   Title pt will be independent in home exercise program   Status On-going            Plan - 01/12/15 1154    Clinical Impression Statement Pt continues to do well with HEP and is requiring less cuing each time. She will begin [facility] at the [facility] next week.   Pt will benefit from skilled therapeutic intervention in order to improve on the following deficits Decreased range of motion;Decreased strength;Increased edema;Decreased activity tolerance;Decreased balance   Rehab Potential Good   Clinical Impairments Affecting Rehab Potential chemotherapy induced peripheral neuropathy   PT Frequency 2x / week   PT Duration 2 weeks   PT Treatment/Interventions Therapeutic exercise   PT Next Visit Plan Discharge next visit and review Strength ABC Program prn   Consulted and Agree with Plan of Care Patient        Problem List Patient Active Problem List   Diagnosis Date Noted  . Shingles 05/15/2014  . Depressed 05/15/2014  . Anxiety 05/15/2014  . Breast pain 05/15/2014  . Rotator cuff tear 02/24/2014  . Hot flashes due to tamoxifen 02/20/2014  . Left shoulder pain with abduction 12/12/2013  . Unspecified hereditary and idiopathic peripheral neuropathy 11/11/2013  . CVA (cerebral infarction) 07/18/2013  . TIA (transient ischemic attack) 07/18/2013  . Contracture of axilla 05/20/2013  . Primary cancer of upper inner quadrant of left female breast 01/24/2013  . Anal fissure 05/03/2011  . Hemorrhoids, internal, with prolapse 05/03/2011     Golda Acre 01/12/2015, 11:56 AM  [place], [name], [address] Phone: [phone]   Fax:  [phone]

## 2015-01-14 ENCOUNTER — Ambulatory Visit: Payer: BC Managed Care – PPO

## 2015-01-14 DIAGNOSIS — R269 Unspecified abnormalities of gait and mobility: Secondary | ICD-10-CM

## 2015-01-14 DIAGNOSIS — G622 Polyneuropathy due to other toxic agents: Secondary | ICD-10-CM | POA: Diagnosis not present

## 2015-01-14 DIAGNOSIS — G620 Drug-induced polyneuropathy: Secondary | ICD-10-CM

## 2015-01-14 DIAGNOSIS — I890 Lymphedema, not elsewhere classified: Secondary | ICD-10-CM

## 2015-01-14 DIAGNOSIS — R2689 Other abnormalities of gait and mobility: Secondary | ICD-10-CM

## 2015-01-14 DIAGNOSIS — T451X5A Adverse effect of antineoplastic and immunosuppressive drugs, initial encounter: Secondary | ICD-10-CM

## 2015-01-14 NOTE — Therapy (Signed)
[place], [name], [address] Phone: [phone]   Fax:  [phone]  Physical Therapy Treatment  Patient Details  Name: Kristi [name] MRN: [id number] Date of Birth: 1956/09/09 Referring Provider:  Larina Earthly, MD  Encounter Date: 01/14/2015      PT End of Session - 01/14/15 1336    Visit Number 30   Number of Visits 31   Date for PT Re-Evaluation 01/15/15   PT Start Time 1302   PT Stop Time 3403   PT Time Calculation (min) 45 min      Past Medical History  Diagnosis Date  . Anxiety   . Hypertension   . MRSA (methicillin resistant Staphylococcus aureus) 2009    right groin area-no issues now. 04-07-14 PCR screen negative today.  . Depression   . Anemia     Iron deficinecy anemia  . History of radiation therapy 09/09/13-10/28/13    45 gray to left breast, lumpectomy cavity boosted to 63 gray  . Neuropathy   . Arthritis   . Breast cancer     left ,last radiation 2'15, last chemo 8'14    Past Surgical History  Procedure Laterality Date  . Anal sphincterotomy  04/2011  . Hemorrhoid surgery  04/2011    ligation  . Breast lumpectomy with needle localization Left 02/04/2013    Procedure: LEFT BREAST LUMPECTOMY WITH NEEDLE LOCALIZATION;  Surgeon: Stark Klein, MD;  Location: [employer];  Service: General;  Laterality: Left;  . Axillary lymph node dissection Left 02/04/2013    Procedure: LEFT AXILLARY LYMPH NODE DISSECTION;  Surgeon: Stark Klein, MD;  Location: [place];  Service: General;  Laterality: Left;  End: 7096  . Portacath placement Right 02/04/2013    Procedure: INSERTION PORT-A-CATH;  Surgeon: Stark Klein, MD;  Location: [place];  Service: General;  Laterality: Right;  Start Time: 4383.  Marland Kitchen Appendectomy  1980  . Breast surgery      Lumpectomy in april 2014  . Shoulder arthroscopy with rotator cuff repair and subacromial decompression Left 02/24/2014    Procedure: SHOULDER ARTHROSCOPY WITH ROTATOR  CUFF REPAIR AND SUBACROMIAL DECOMPRESSION;  Surgeon: Meredith Pel, MD;  Location: [place];  Service: Orthopedics;  Laterality: Left;  LEFT SHOULDER DIAGNOSTIC OPERATIVE ARTHROSCOPY, SUBACROMIAL DECOMPRESSION, ROTATOR CUFF TEAR REPAIR  . Port-a-cath removal N/A 04/16/2014    Procedure: REMOVAL PORT-A-CATH;  Surgeon: Stark Klein, MD;  [facility];  Service: General;  Laterality: N/A;    There were no vitals filed for this visit.  Visit Diagnosis:  Functional gait disorder  Balance problems  Lymphedema of breast  Chemotherapy-induced peripheral neuropathy      Subjective Assessment - 01/14/15 1309    Symptoms Got my Flexitouch on Saturday and have used it every day since then. Its a little cumbersome for now, but I think itll be better after I get a little more practice. It helped decrese my swelling each time I've used it except once.   Currently in Pain? No/denies                       [facility] Adult PT Treatment/Exercise - 01/14/15 0001    Shoulder Exercises: ROM/Strengthening   Other ROM/Strengthening Exercises Reviewed Stretches from Strength ABC Program: Bil chest, shoulder, tricep, calf, quadriceps, seated piriformis figure 4 and hamstring stretches, then supine for low trunk rotation all 1 rep and 15 sec holds each.    Other ROM/Strengthening Exercises Strength from Strength ABC Program:  Supine Bridging and chest presses 3 lbs, bil S/L for clam, then standing with hands on lowered mat table for alternating LE extension, squats holding 3 lbs, standing row using lowered mat table and 3 lbs, fingertips on wall for bil hip abduction, scaption against wall 2 lbs, 8" step ups: all 2 sets of 10 reps each unless otherwise specified.                PT Education - 01/14/15 1336    Education provided Yes   Education Details Reviewed Strength ABC Program   Person(s) Educated Patient   Methods Explanation   Comprehension Verbalized understanding;Returned  demonstration           Short Term Clinic Goals - 12/28/14 1645    CC Short Term Goal  #1   Status On-going             Long Term Clinic Goals - 01/14/15 1341    CC Long Term Goal  #4   Title pt will report 50% decrease in breast lymphedema symptoms.  30% improvement reported thus far as pt just received the Flexitouch on Saturday.   Status Partially Met   CC Long Term Goal  #5   Title pt will be independent in home exercise program   Status Achieved            Plan - 01/14/15 1339    Clinical Impression Statement Pt is compliant with use of Flexitouch and is independent with HEP and will be transitioning into the [facility] Program at the [facility] next week. Pt is ready for discharge.   Pt will benefit from skilled therapeutic intervention in order to improve on the following deficits Decreased range of motion;Decreased strength;Increased edema;Decreased activity tolerance;Decreased balance   Rehab Potential Good   Clinical Impairments Affecting Rehab Potential chemotherapy induced peripheral neuropathy   PT Frequency 2x / week   PT Duration 2 weeks   PT Treatment/Interventions Therapeutic exercise   PT Next Visit Plan Discharge visit.   Consulted and Agree with Plan of Care Patient        Problem List Patient Active Problem List   Diagnosis Date Noted  . Shingles 05/15/2014  . Depressed 05/15/2014  . Anxiety 05/15/2014  . Breast pain 05/15/2014  . Rotator cuff tear 02/24/2014  . Hot flashes due to tamoxifen 02/20/2014  . Left shoulder pain with abduction 12/12/2013  . Unspecified hereditary and idiopathic peripheral neuropathy 11/11/2013  . CVA (cerebral infarction) 07/18/2013  . TIA (transient ischemic attack) 07/18/2013  . Contracture of axilla 05/20/2013  . Primary cancer of upper inner quadrant of left female breast 01/24/2013  . Anal fissure 05/03/2011  . Hemorrhoids, internal, with prolapse 05/03/2011    Collie Siad Ann,PTA 01/14/2015,  1:50 PM  PHYSICAL THERAPY DISCHARGE SUMMARY  Visits from Start of Care: 30  Current functional level related to goals / functional outcomes: Pt has improved in gait and exercise tolerance and is now ready to transition to [facility] program   Remaining deficits: Pain in feet and left breast    Education / Equipment: Home exercise, self manual lymph drainage, intermittent compression pump to help with swelling and pain  Plan: Patient agrees to discharge.  Patient goals were partially met. Patient is being discharged due to  transition to community exercise porgram and having Flexitouch device to help manage breast pain and swelling at home????        Maudry Diego, PT 01/14/2015 5:56 PM    [place], [name], [address] Phone: [phone]   Fax:  [phone]

## 2015-02-10 ENCOUNTER — Telehealth: Payer: Self-pay

## 2015-02-10 NOTE — Telephone Encounter (Signed)
Mammogram results dtd 01/27/15 rcvd from [place].  Reviewed by Dr [name].  Sent to scan.

## 2015-02-26 ENCOUNTER — Encounter: Payer: Self-pay | Admitting: Family Medicine

## 2015-03-11 ENCOUNTER — Ambulatory Visit: Payer: BC Managed Care – PPO | Admitting: Radiation Oncology

## 2015-03-15 ENCOUNTER — Ambulatory Visit: Payer: BC Managed Care – PPO | Attending: Orthopedic Surgery | Admitting: Physical Therapy

## 2015-03-15 DIAGNOSIS — I890 Lymphedema, not elsewhere classified: Secondary | ICD-10-CM | POA: Insufficient documentation

## 2015-03-15 DIAGNOSIS — R269 Unspecified abnormalities of gait and mobility: Secondary | ICD-10-CM | POA: Insufficient documentation

## 2015-03-15 DIAGNOSIS — N644 Mastodynia: Secondary | ICD-10-CM

## 2015-03-15 DIAGNOSIS — G622 Polyneuropathy due to other toxic agents: Secondary | ICD-10-CM | POA: Insufficient documentation

## 2015-03-15 DIAGNOSIS — R29818 Other symptoms and signs involving the nervous system: Secondary | ICD-10-CM | POA: Insufficient documentation

## 2015-03-15 NOTE — Therapy (Signed)
[place], [name], [address] Phone: [phone]   Fax:  [phone]  Physical Therapy Treatment  Patient Details  Name: Kristi [name] MRN: [id number] Date of Birth: 04/09/1956 Referring Provider:  Larina Earthly, MD  Encounter Date: 03/15/2015    Past Medical History  Diagnosis Date  . Anxiety   . Hypertension   . MRSA (methicillin resistant Staphylococcus aureus) 2009    right groin area-no issues now. 04-07-14 PCR screen negative today.  . Depression   . Anemia     Iron deficinecy anemia  . History of radiation therapy 09/09/13-10/28/13    45 gray to left breast, lumpectomy cavity boosted to 63 gray  . Neuropathy   . Arthritis   . Breast cancer     left ,last radiation 2'15, last chemo 8'14    Past Surgical History  Procedure Laterality Date  . Anal sphincterotomy  04/2011  . Hemorrhoid surgery  04/2011    ligation  . Breast lumpectomy with needle localization Left 02/04/2013    Procedure: LEFT BREAST LUMPECTOMY WITH NEEDLE LOCALIZATION;  Surgeon: Stark Klein, MD;  Location: [name];  Service: General;  Laterality: Left;  . Axillary lymph node dissection Left 02/04/2013    Procedure: LEFT AXILLARY LYMPH NODE DISSECTION;  Surgeon: Stark Klein, MD;  Location: [place];  Service: General;  Laterality: Left;  End: 7262  . Portacath placement Right 02/04/2013    Procedure: INSERTION PORT-A-CATH;  Surgeon: Stark Klein, MD;  Location: [place];  Service: General;  Laterality: Right;  Start Time: 0355.  Marland Kitchen Appendectomy  1980  . Breast surgery      Lumpectomy in april 2014  . Shoulder arthroscopy with rotator cuff repair and subacromial decompression Left 02/24/2014    Procedure: SHOULDER ARTHROSCOPY WITH ROTATOR CUFF REPAIR AND SUBACROMIAL DECOMPRESSION;  Surgeon: Meredith Pel, MD;  Location: [place];  Service: Orthopedics;  Laterality: Left;  LEFT SHOULDER DIAGNOSTIC OPERATIVE ARTHROSCOPY, SUBACROMIAL  DECOMPRESSION, ROTATOR CUFF TEAR REPAIR  . Port-a-cath removal N/A 04/16/2014    Procedure: REMOVAL PORT-A-CATH;  Surgeon: Stark Klein, MD;  [facility];  Service: General;  Laterality: N/A;    There were no vitals filed for this visit.  Visit Diagnosis:  Breast pain      Subjective Assessment - 03/15/15 1026    Subjective Has been using the Flexitouch regularly, sometimes not every day and sometimes twice a day.  Hard to tell if it helps.  Had a red spot at left upper chest so she adjusted the pump garments;            [facility] PT Assessment - 03/15/15 0001    Observation/Other Assessments   Skin Integrity medial aspect of upper breast  incision line is mildly reddened and the area may be slightly firm.         This was an uncharged visit to remeasure patient's chest, waist, and left arm circumferences after she has used her Flexitouch lymphedema pump for a couple of months to [employer] requirements.  Measurements will be sent to [employer] maker of Flexitouch.                       Short Term Clinic Goals - 12/28/14 1645    CC Short Term Goal  #1   Status On-going             Long Term Clinic Goals - 01/14/15 1341    CC Long Term  Goal  #4   Title pt will report 50% decrease in breast lymphedema symptoms.  30% improvement reported thus far as pt just received the Flexitouch on Saturday.   Status Partially Met   CC Long Term Goal  #5   Title pt will be independent in home exercise program   Status Achieved            Problem List Patient Active Problem List   Diagnosis Date Noted  . Shingles 05/15/2014  . Depressed 05/15/2014  . Anxiety 05/15/2014  . Breast pain 05/15/2014  . Rotator cuff tear 02/24/2014  . Hot flashes due to tamoxifen 02/20/2014  . Left shoulder pain with abduction 12/12/2013  . Unspecified hereditary and idiopathic peripheral neuropathy 11/11/2013  . CVA (cerebral infarction) 07/18/2013   . TIA (transient ischemic attack) 07/18/2013  . Contracture of axilla 05/20/2013  . Primary cancer of upper inner quadrant of left female breast 01/24/2013  . Anal fissure 05/03/2011  . Hemorrhoids, internal, with prolapse 05/03/2011    SALISBURY,DONNA 03/15/2015, 12:39 PM  [place], [name], [address] Phone: [phone]   Fax:  [facility], PT 03/15/2015 12:40 PM

## 2015-03-22 NOTE — Assessment & Plan Note (Signed)
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 followed by Taxol carboplatin later switched to carbo Gemzar completed 08/21/2013, status post adjuvant radiation, currently on tamoxifen started 11/13/2013  Tamoxifen Toxicities:  Breast Cancer Surveillance: 1. Breast exam 03/23/15: Normal 2. Mammogram 01/27/15 No abnormalities. Postsurgical changes. Breast Density Category B. I recommended that she get 3-D mammograms for surveillance. Discussed the differences between different breast density categories.   RTC in 6 months  

## 2015-03-23 ENCOUNTER — Telehealth: Payer: Self-pay | Admitting: Hematology and Oncology

## 2015-03-23 ENCOUNTER — Ambulatory Visit (HOSPITAL_BASED_OUTPATIENT_CLINIC_OR_DEPARTMENT_OTHER): Payer: BC Managed Care – PPO | Admitting: Hematology and Oncology

## 2015-03-23 VITALS — BP 111/67 | HR 80 | Temp 98.3°F | Resp 18 | Ht 62.0 in | Wt 164.0 lb

## 2015-03-23 DIAGNOSIS — Z170 Estrogen receptor positive status [ER+]: Secondary | ICD-10-CM

## 2015-03-23 DIAGNOSIS — C50812 Malignant neoplasm of overlapping sites of left female breast: Secondary | ICD-10-CM

## 2015-03-23 DIAGNOSIS — I890 Lymphedema, not elsewhere classified: Secondary | ICD-10-CM

## 2015-03-23 DIAGNOSIS — C50212 Malignant neoplasm of upper-inner quadrant of left female breast: Secondary | ICD-10-CM | POA: Diagnosis not present

## 2015-03-23 NOTE — Telephone Encounter (Signed)
Gave avs & calendar for November.  °

## 2015-03-23 NOTE — Progress Notes (Signed)
Patient Care Team: Larina Earthly, MD as PCP - General (Family Medicine) Right leg DIAGNOSIS: Primary cancer of upper inner quadrant of left female breast   Staging form: Breast, AJCC 7th Edition     Clinical: Stage IA (T1c, N0, cM0) - Unsigned       Staging comments: Staged at breast conference 3.26.14      Pathologic: No stage assigned - Unsigned   SUMMARY OF ONCOLOGIC HISTORY:   Primary cancer of upper inner quadrant of left female breast   01/24/2013 Initial Diagnosis Ultrasound: Left breast no position 1 x 0.7 cm, 12:00 position 1.6 x 0.9 cm in addition 2.1 cm axillary lymph node: Biopsies of breast and axilla were positive for IDC high-grade;  ER weakly positive, PR negative, HER-2 negative   02/04/2013 Surgery Left breast lumpectomy: 1.6 cm tumor 1/19 axillary lymph nodes positive, ER 6%, PR 0%, HER-2 negative, Ki-67 90%   03/05/2013 - 08/21/2013 Chemotherapy Adjuvant chemotherapy with Adriamycin and Cytoxan followed by Taxol and [place] x9 followed by Purvis [place] x2   09/09/2013 - 10/28/2013 Radiation Therapy Adjuvant radiation therapy   11/13/2013 -  Anti-estrogen oral therapy Tamoxifen 20 mg daily    CHIEF COMPLIANT: Follow-up on tamoxifen  INTERVAL HISTORY: Kristi [name] is a 59 year old with above-mentioned history of left-sided breast cancer treated with lumpectomy followed by adjuvant chemotherapy and radiation and is currently on tamoxifen since January 2015. She is tolerating tamoxifen fairly well without any major problems or concerns. Her biggest concern is lymphedema involving the left breast. Neuropathy in her feet is improving.  REVIEW OF SYSTEMS:   Constitutional: Denies fevers, chills or abnormal weight loss Eyes: Denies blurriness of vision Ears, nose, mouth, throat, and face: Denies mucositis or sore throat Respiratory: Denies cough, dyspnea or wheezes Cardiovascular: Denies palpitation, chest discomfort or lower extremity swelling Gastrointestinal:  Denies nausea,  heartburn or change in bowel habits Skin: Denies abnormal skin rashes Lymphatics: Denies new lymphadenopathy or easy bruising Neurological: Neuropathy in the feet is improving Behavioral/Psych: Mood is stable, no new changes  Breast: Left breast lymphedema All other systems were reviewed with the patient and are negative.  I have reviewed the past medical history, past surgical history, social history and family history with the patient and they are unchanged from previous note.  ALLERGIES:  has No Known Allergies.  MEDICATIONS:  Current Outpatient Prescriptions  Medication Sig Dispense Refill  . ALPRAZolam (XANAX) 1 MG tablet Take 1-2 mg by mouth 2 (two) times daily as needed for anxiety.     . Ascorbic Acid (VITAMIN C) 1000 MG tablet Take 1,000 mg by mouth daily.    Marland Kitchen aspirin EC 81 MG tablet Take 162 mg by mouth daily.    . B Complex-C (SUPER B COMPLEX PO) Take 1 tablet by mouth daily.    Marland Kitchen buPROPion (WELLBUTRIN) 100 MG tablet   3  . busPIRone (BUSPAR) 15 MG tablet Take 15 mg by mouth 3 (three) times daily.    . DULoxetine (CYMBALTA) 60 MG capsule Take 60 mg by mouth 2 (two) times daily.    Marland Kitchen gabapentin (NEURONTIN) 800 MG tablet Take 1 tablet (800 mg total) by mouth 3 (three) times daily. 90 tablet 2  . L-Methylfolate-Algae-B12-B6 (METANX) 3-90.314-2-35 MG CAPS   2  . Multiple Vitamin (MULTIVITAMIN WITH MINERALS) TABS tablet Take 1 tablet by mouth daily.    . tamoxifen (NOLVADEX) 20 MG tablet Take 1 tablet (20 mg total) by mouth daily. 30 tablet 3  . valsartan-hydrochlorothiazide (DIOVAN-HCT) 320-25  MG per tablet Take 1 tablet by mouth every morning.     . [DISCONTINUED] prochlorperazine (COMPAZINE) 10 MG tablet Take 1 tablet (10 mg total) by mouth every 6 (six) hours as needed (Nausea or vomiting). 30 tablet 1  . [DISCONTINUED] prochlorperazine (COMPAZINE) 25 MG suppository Place 1 suppository (25 mg total) rectally every 12 (twelve) hours as needed for nausea. 12 suppository 3   No  current facility-administered medications for this visit.    PHYSICAL EXAMINATION: ECOG PERFORMANCE STATUS: 0 - Asymptomatic  There were no vitals filed for this visit. There were no vitals filed for this visit.  GENERAL:alert, no distress and comfortable SKIN: skin color, texture, turgor are normal, no rashes or significant lesions EYES: normal, Conjunctiva are pink and non-injected, sclera clear OROPHARYNX:no exudate, no erythema and lips, buccal mucosa, and tongue normal  NECK: supple, thyroid normal size, non-tender, without nodularity LYMPH:  no palpable lymphadenopathy in the cervical, axillary or inguinal LUNGS: clear to auscultation and percussion with normal breathing effort HEART: regular rate & rhythm and no murmurs and no lower extremity edema ABDOMEN:abdomen soft, non-tender and normal bowel sounds Musculoskeletal:no cyanosis of digits and no clubbing  NEURO: alert & oriented x 3 with fluent speech, no focal motor/sensory deficits BREAST: Left breast lymphedema. (exam performed in the presence of a chaperone)  LABORATORY DATA:  I have reviewed the data as listed   Chemistry      Component Value Date/Time   NA 141 09/01/2014 1525   NA 141 05/14/2014 1822   K 3.6 09/01/2014 1525   K 3.6* 05/14/2014 1822   CL 101 05/14/2014 1822   CL 100 04/23/2013 1447   CO2 28 09/01/2014 1525   CO2 30 05/14/2014 1822   BUN 13.3 09/01/2014 1525   BUN 12 05/14/2014 1822   CREATININE 0.8 09/01/2014 1525   CREATININE 0.78 05/14/2014 1822      Component Value Date/Time   CALCIUM 9.4 09/01/2014 1525   CALCIUM 9.7 05/14/2014 1822   ALKPHOS 62 09/01/2014 1525   ALKPHOS 69 05/14/2014 1822   AST 19 09/01/2014 1525   AST 22 05/14/2014 1822   ALT 17 09/01/2014 1525   ALT 16 05/14/2014 1822   BILITOT 0.22 09/01/2014 1525   BILITOT 0.2* 05/14/2014 1822       Lab Results  Component Value Date   WBC 4.5 09/01/2014   HGB 11.1* 09/01/2014   HCT 35.0 09/01/2014   MCV 90.4  09/01/2014   PLT 132* 09/01/2014   NEUTROABS 2.0 09/01/2014    ASSESSMENT & PLAN:  Primary cancer of upper 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 followed by Taxol carboplatin later switched to [place] Gemzar completed 08/21/2013, status post adjuvant radiation, currently on tamoxifen started 11/13/2013  Tamoxifen Toxicities: No major side effects of tamoxifen therapy complains of knee arthritis in the right knee  Left breast Lymphedema: Patient follows with physical therapy 1. They provided her with exercises 2. They are monitoring her with measurements  3. She has a flex touch machine: She is not sure if it is working well. I instructed her to take it to physical therapy to be evaluated.  Breast Cancer Surveillance: 1. Breast exam 03/23/15: Normal 2. Mammogram 01/27/15 No abnormalities. Postsurgical changes. Breast Density Category B. I recommended that she get  3-D mammograms for surveillance. Discussed the differences between different breast density categories.   RTC in 6 months  No orders of the defined types were placed in this encounter.   The patient has a good understanding of the overall plan. she agrees with it. she will call with any problems that may develop before the next visit here.   Rulon Eisenmenger, MD

## 2015-03-25 ENCOUNTER — Encounter: Payer: Self-pay | Admitting: Radiation Oncology

## 2015-03-25 ENCOUNTER — Ambulatory Visit
Admission: RE | Admit: 2015-03-25 | Discharge: 2015-03-25 | Disposition: A | Discharge status: Home / Self Care | Payer: BC Managed Care – PPO | Source: Ambulatory Visit | Attending: Radiation Oncology | Admitting: Radiation Oncology

## 2015-03-25 VITALS — BP 117/67 | HR 80 | Temp 98.3°F | Resp 12 | Ht 62.0 in | Wt 163.0 lb

## 2015-03-25 DIAGNOSIS — C50212 Malignant neoplasm of upper-inner quadrant of left female breast: Secondary | ICD-10-CM

## 2015-03-25 NOTE — Progress Notes (Signed)
Radiation Oncology         [phone] ________________________________  Name: Kristi [name] MRN: [id number]  Date: 03/25/2015  DOB: 1955/11/16  Follow-Up Visit Note  CC: Ron Parker, MD  [name], MD   Diagnosis:   Multifocal invasive ductal carcinoma of the left breast (mpT1c, pN1a, pMx)   Interval Since Last Radiation:  1 year 5 months  Narrative:  The patient returns today for routine follow-up. Catalina Pizza here for follow up after treatment to her left breast. She reports having soreness under her left arm. She also has lymphedema in her left breast and is using a flexi-touch pump daily. She is taking tamoxifen. She reports a good energy level and is participating in the [place] program. The skin on her left breast is intact. The breast does appear to be swollen. Manual physical therapy isn't really helping. Denies pain in arm and breast area, does have soreness. Denies any discharge.                         ALLERGIES:  has No Known Allergies.  Meds: Current Outpatient Prescriptions  Medication Sig Dispense Refill  . ALPRAZolam (XANAX) 1 MG tablet Take 1-2 mg by mouth 2 (two) times daily as needed for anxiety.     . Ascorbic Acid (VITAMIN C) 1000 MG tablet Take 1,000 mg by mouth daily.    Marland Kitchen aspirin EC 81 MG tablet Take 162 mg by mouth daily.    . B Complex-C (SUPER B COMPLEX PO) Take 1 tablet by mouth daily.    . busPIRone (BUSPAR) 15 MG tablet Take 15 mg by mouth 3 (three) times daily.    . DULoxetine (CYMBALTA) 60 MG capsule Take 60 mg by mouth 2 (two) times daily.    Marland Kitchen gabapentin (NEURONTIN) 800 MG tablet Take 1 tablet (800 mg total) by mouth 3 (three) times daily. 90 tablet 2  . Multiple Vitamin (MULTIVITAMIN WITH MINERALS) TABS tablet Take 1 tablet by mouth daily.    . tamoxifen (NOLVADEX) 20 MG tablet Take 1 tablet (20 mg total) by mouth daily. 30 tablet 3  . valsartan-hydrochlorothiazide (DIOVAN-HCT) 320-25 MG per tablet Take 1 tablet by mouth every  morning.     Marland Kitchen L-Methylfolate-Algae-B12-B6 (METANX) 3-90.314-2-35 MG CAPS   2  . traMADol (ULTRAM) 50 MG tablet   2  . [DISCONTINUED] prochlorperazine (COMPAZINE) 10 MG tablet Take 1 tablet (10 mg total) by mouth every 6 (six) hours as needed (Nausea or vomiting). 30 tablet 1  . [DISCONTINUED] prochlorperazine (COMPAZINE) 25 MG suppository Place 1 suppository (25 mg total) rectally every 12 (twelve) hours as needed for nausea. 12 suppository 3   No current facility-administered medications for this encounter.    Physical Findings: The patient is in no acute distress. Patient is alert and oriented. BP 117/67 mmHg  Pulse 80  Temp(Src) 98.3 F (36.8 C) (Oral)  Resp 12  Ht 5\' 2"  (1.575 m)  Wt 163 lb (73.936 kg)  BMI 29.81 kg/m2  LMP 01/20/2013 No palpable subclavicular or axillary adenopathy. Lungs are clear to auscultation. The heart has a regular rhythm and rate. Examination of left breast reveals continued s edema with mild  peau d'orange changes. No dominant masses palpable within the breast nipple discharge or bleeding. No signs of infection.  There appears to be less swelling compared to my previous exam.  Lab Findings: Lab Results  Component Value Date   WBC 4.5 09/01/2014   HGB 11.1* 09/01/2014  HCT 35.0 09/01/2014   MCV 90.4 09/01/2014   PLT 132* 09/01/2014    Radiographic Findings: The mamm[facility] at [facility] on March 23 shows no evidence of recurrence or problems in the right breast.   Impression:  No evidence of recurrence on clinical exam today. Continued significant lymphedema.    Plan:  Routine followup in 6 months. Encouraged patient to continue  exercises and use her Flexitouch machine as she was instructed from the lymphedema clinic.  This document serves as a record of services personally performed by Gery Pray, MD. It was created on his behalf by Jeralene Peters, a trained medical scribe. The creation of this record is based on the scribe's personal observations  and the provider's statements to them. This document has been checked and approved by the attending provider.       ____________________________________ Blair Promise, PhD., MD

## 2015-03-25 NOTE — Progress Notes (Addendum)
Kristi [name] here for follow up after treatment to her left breast.  She reports having soreness under her left arm.  She also has lymphedema in her left breast and is using a pump daily.  She is taking tamoxifen.  She reports a good energy level and is participating in the [place] program.  The skin on her left breast is intact.  The breast does appear to be swollen.  BP 117/67 mmHg  Pulse 80  Temp(Src) 98.3 F (36.8 C) (Oral)  Resp 12  Ht 5\' 2"  (1.575 m)  Wt 163 lb (73.936 kg)  BMI 29.81 kg/m2  LMP 01/20/2013

## 2015-04-01 ENCOUNTER — Other Ambulatory Visit: Payer: Self-pay | Admitting: *Deleted

## 2015-04-01 DIAGNOSIS — C50212 Malignant neoplasm of upper-inner quadrant of left female breast: Secondary | ICD-10-CM

## 2015-04-01 MED ORDER — GABAPENTIN 800 MG PO TABS: 800.0000 mg | ORAL_TABLET | Freq: Three times a day (TID) | ORAL | Status: DC

## 2015-04-01 MED ORDER — TAMOXIFEN CITRATE 20 MG PO TABS: 20.0000 mg | ORAL_TABLET | Freq: Every day | ORAL | Status: DC

## 2015-05-07 LAB — HM PAP SMEAR: HM PAP: NORMAL

## 2015-05-17 LAB — HM PAP SMEAR

## 2015-06-03 ENCOUNTER — Ambulatory Visit: Payer: BC Managed Care – PPO | Admitting: Obstetrics

## 2015-07-02 ENCOUNTER — Encounter: Payer: Self-pay | Admitting: *Deleted

## 2015-07-02 ENCOUNTER — Telehealth: Payer: Self-pay | Admitting: *Deleted

## 2015-07-02 NOTE — Telephone Encounter (Signed)
Pre-Visit Call completed with patient and chart updated.   Pre-Visit Info documented in Specialty Comments under [employer].    

## 2015-07-04 ENCOUNTER — Other Ambulatory Visit: Payer: Self-pay | Admitting: Hematology and Oncology

## 2015-07-05 ENCOUNTER — Encounter: Payer: Self-pay | Admitting: Family Medicine

## 2015-07-05 ENCOUNTER — Ambulatory Visit (INDEPENDENT_AMBULATORY_CARE_PROVIDER_SITE_OTHER): Payer: BC Managed Care – PPO | Admitting: Family Medicine

## 2015-07-05 ENCOUNTER — Other Ambulatory Visit: Payer: Self-pay | Admitting: *Deleted

## 2015-07-05 VITALS — BP 102/64 | HR 86 | Temp 98.2°F | Ht 62.0 in | Wt 174.0 lb

## 2015-07-05 DIAGNOSIS — F419 Anxiety disorder, unspecified: Secondary | ICD-10-CM | POA: Diagnosis not present

## 2015-07-05 DIAGNOSIS — F32A Depression, unspecified: Secondary | ICD-10-CM

## 2015-07-05 DIAGNOSIS — F418 Other specified anxiety disorders: Secondary | ICD-10-CM | POA: Diagnosis not present

## 2015-07-05 DIAGNOSIS — C50212 Malignant neoplasm of upper-inner quadrant of left female breast: Secondary | ICD-10-CM | POA: Diagnosis not present

## 2015-07-05 DIAGNOSIS — F329 Major depressive disorder, single episode, unspecified: Secondary | ICD-10-CM

## 2015-07-05 DIAGNOSIS — Z8619 Personal history of other infectious and parasitic diseases: Secondary | ICD-10-CM | POA: Insufficient documentation

## 2015-07-05 DIAGNOSIS — G609 Hereditary and idiopathic neuropathy, unspecified: Secondary | ICD-10-CM

## 2015-07-05 MED ORDER — TAMOXIFEN CITRATE 20 MG PO TABS: 20.0000 mg | ORAL_TABLET | Freq: Every day | ORAL | Status: DC

## 2015-07-05 NOTE — Patient Instructions (Addendum)
Call insurance company and ask if they will cover Zostavax vaccine(for Shingles).   Generalized Anxiety Disorder Generalized anxiety disorder (GAD) is a mental disorder. It interferes with life functions, including relationships, work, and school. GAD is different from normal anxiety, which everyone experiences at some point in their lives in response to specific life events and activities. Normal anxiety actually helps [place] prepare for and get through these life events and activities. Normal anxiety goes away after the event or activity is over.  GAD causes anxiety that is not necessarily related to specific events or activities. It also causes excess anxiety in proportion to specific events or activities. The anxiety associated with GAD is also difficult to control. GAD can vary from mild to severe. People with severe GAD can have intense waves of anxiety with physical symptoms (panic attacks).  SYMPTOMS The anxiety and worry associated with GAD are difficult to control. This anxiety and worry are related to many life events and activities and also occur more days than not for 6 months or longer. People with GAD also have three or more of the following symptoms (one or more in children):  Restlessness.   Fatigue.  Difficulty concentrating.   Irritability.  Muscle tension.  Difficulty sleeping or unsatisfying sleep. DIAGNOSIS GAD is diagnosed through an assessment by your health care provider. Your health care provider will ask you questions aboutyour mood,physical symptoms, and events in your life. Your health care provider may ask you about your medical history and use of alcohol or drugs, including prescription medicines. Your health care provider may also do a physical exam and blood tests. Certain medical conditions and the use of certain substances can cause symptoms similar to those associated with GAD. Your health care provider may refer you to a mental health specialist for further  evaluation. TREATMENT The following therapies are usually used to treat GAD:   Medication. Antidepressant medication usually is prescribed for long-term daily control. Antianxiety medicines may be added in severe cases, especially when panic attacks occur.   Talk therapy (psychotherapy). Certain types of talk therapy can be helpful in treating GAD by providing support, education, and guidance. A form of talk therapy called cognitive behavioral therapy can teach you healthy ways to think about and react to daily life events and activities.  Stress managementtechniques. These include yoga, meditation, and exercise and can be very helpful when they are practiced regularly. A mental health specialist can help determine which treatment is best for you. Some people see improvement with one therapy. However, other people require a combination of therapies. Document Released: 02/17/2013 Document Revised: 03/09/2014 Document Reviewed: 02/17/2013 [facility] Patient Information 2015 [place], [place]. This information is not intended to replace advice given to you by your health care provider. Make sure you discuss any questions you have with your health care provider.

## 2015-07-05 NOTE — Progress Notes (Signed)
Pre visit review using our clinic review tool, if applicable. No additional management support is needed unless otherwise documented below in the visit note. 

## 2015-07-05 NOTE — Progress Notes (Signed)
Patient ID: Kristi [name], female   DOB: 1956-10-14, 59 y.o.   MRN: [id number]   Kristi [name]  female [id number] 04-19-1956 59 y.o. 07/05/2015      Progress Note-Follow Up  Subjective   HPI  Patient is in today for new patient appointment. She has a past medical history significant for depression anxiety breast cancer, peripheral neuropathy, herpes zoster. No acute or recent new illness. No new concerns. Denies CP/palp/SOB/HA/congestion/fevers/GI or GU c/o. Taking meds as prescribed Chief Complaint  Patient presents with  . Establish Care    Past Medical History  Diagnosis Date  . Anxiety   . Hypertension   . MRSA (methicillin resistant Staphylococcus aureus) 2009    right groin area-no issues now. 04-07-14 PCR screen negative today.  . Depression   . Anemia     Iron deficinecy anemia  . History of radiation therapy 09/09/13-10/28/13    45 gray to left breast, lumpectomy cavity boosted to 63 gray  . Neuropathy   . Arthritis   . Breast cancer     left ,last radiation 2'15, last chemo 8'14    Past Surgical History  Procedure Laterality Date  . Anal sphincterotomy  04/2011  . Hemorrhoid surgery  04/2011    ligation  . Breast lumpectomy with needle localization Left 02/04/2013    Procedure: LEFT BREAST LUMPECTOMY WITH NEEDLE LOCALIZATION;  Surgeon: Stark Klein, MD;  Location: [facility];  Service: General;  Laterality: Left;  . Axillary lymph node dissection Left 02/04/2013    Procedure: LEFT AXILLARY LYMPH NODE DISSECTION;  Surgeon: Stark Klein, MD;  Location: [facility];  Service: General;  Laterality: Left;  End: 9470  . Portacath placement Right 02/04/2013    Procedure: INSERTION PORT-A-CATH;  Surgeon: Stark Klein, MD;  Location: [facility];  Service: General;  Laterality: Right;  Start Time: 9628.  Marland Kitchen Appendectomy  1980  . Breast surgery      Lumpectomy in april 2014  . Shoulder arthroscopy with rotator cuff repair and subacromial decompression Left 02/24/2014    Procedure: SHOULDER  ARTHROSCOPY WITH ROTATOR CUFF REPAIR AND SUBACROMIAL DECOMPRESSION;  Surgeon: Meredith Pel, MD;  Location: [facility];  Service: Orthopedics;  Laterality: Left;  LEFT SHOULDER DIAGNOSTIC OPERATIVE ARTHROSCOPY, SUBACROMIAL DECOMPRESSION, ROTATOR CUFF TEAR REPAIR  . Port-a-cath removal N/A 04/16/2014    Procedure: REMOVAL PORT-A-CATH;  Surgeon: Stark Klein, MD;  Location: [facility];  Service: General;  Laterality: N/A;    Family History  Problem Relation Age of Onset  . Lung cancer Father   . Hypertension Father   . Thyroid cancer Father     dx in his 50s  . Breast cancer Paternal Aunt 45  . Colon cancer Paternal Aunt     dx in her 50x  . Cervical cancer Paternal Aunt     dzx in her 59s  . Ovarian cancer Cousin     dx in her lage 55s  . Breast cancer Cousin     maternal first cousin, once removed; dx in her late 62s  . Breast cancer Cousin     maternal first cousin once removed; dx in late 60s  . Hypertension Mother   . Diabetes Mother   . Dementia Mother   . Hypertension Brother   . Seizures Brother     Alcohol induced.  . Cancer Paternal Uncle     oral cancer  . Kidney cancer Paternal Grandmother   . Cancer Cousin     several paternal cousins with brain cancer, leukemia,  and other cancers    Social History   Social History  . Marital Status: Married    Spouse Name: [name]  . Number of Children: 1  . Years of Education: 12   Occupational History  . [place]   Social History Main Topics  . Smoking status: Never Smoker   . Smokeless tobacco: Never Used  . Alcohol Use: No  . Drug Use: No  . Sexual Activity: Yes   Other Topics Concern  . Not on file   Social History Narrative   Patient is married [name]) and lives at home with her husband.   Patient has one daughter.   Patient is working at [employer]   Patient has a 12th grade education.   Patient drinks very little caffeine.    Current Outpatient Prescriptions on File Prior to  Visit  Medication Sig Dispense Refill  . ALPRAZolam (XANAX) 1 MG tablet Take 1-2 mg by mouth 2 (two) times daily as needed for anxiety.     . Ascorbic Acid (VITAMIN C) 1000 MG tablet Take 1,000 mg by mouth daily.    Marland Kitchen aspirin EC 81 MG tablet Take 162 mg by mouth daily.    . B Complex-C (SUPER B COMPLEX PO) Take 1 tablet by mouth daily.    . clonazePAM (KLONOPIN) 1 MG tablet Take 1 mg by mouth 3 (three) times daily as needed for anxiety.    . DULoxetine (CYMBALTA) 60 MG capsule Take 60 mg by mouth 3 (three) times daily.     Marland Kitchen gabapentin (NEURONTIN) 800 MG tablet Take 1 tablet (800 mg total) by mouth 3 (three) times daily. 90 tablet 2  . Multiple Vitamin (MULTIVITAMIN WITH MINERALS) TABS tablet Take 1 tablet by mouth daily.    . traMADol (ULTRAM) 50 MG tablet   2  . valsartan-hydrochlorothiazide (DIOVAN-HCT) 320-25 MG per tablet Take 1 tablet by mouth every morning.     . busPIRone (BUSPAR) 15 MG tablet Take 15 mg by mouth 3 (three) times daily.    Marland Kitchen L-Methylfolate-Algae-B12-B6 (METANX) 3-90.314-2-35 MG CAPS   2  . [DISCONTINUED] prochlorperazine (COMPAZINE) 10 MG tablet Take 1 tablet (10 mg total) by mouth every 6 (six) hours as needed (Nausea or vomiting). 30 tablet 1  . [DISCONTINUED] prochlorperazine (COMPAZINE) 25 MG suppository Place 1 suppository (25 mg total) rectally every 12 (twelve) hours as needed for nausea. 12 suppository 3   No current facility-administered medications on file prior to visit.    No Known Allergies  Review of Systems  Review of Systems  Constitutional: Positive for malaise/fatigue. Negative for fever and chills.  HENT: Negative for congestion and hearing loss.   Eyes: Negative for discharge.  Respiratory: Negative for cough, sputum production and shortness of breath.   Cardiovascular: Negative for chest pain, palpitations and leg swelling.  Gastrointestinal: Negative for heartburn, nausea, vomiting, abdominal pain, diarrhea, constipation and blood in stool.    Genitourinary: Negative for dysuria, urgency, frequency and hematuria.  Musculoskeletal: Negative for back pain and falls.  Skin: Negative for rash.  Neurological: Negative for dizziness, sensory change, loss of consciousness, weakness and headaches.  Endo/Heme/Allergies: Negative for environmental allergies. Does not bruise/bleed easily.  Psychiatric/Behavioral: Positive for depression. Negative for suicidal ideas. The patient is nervous/anxious. The patient does not have insomnia.     Objective  Filed Vitals:   07/05/15 1044  BP: 102/64  Pulse: 86  Temp: 98.2 F (36.8 C)  TempSrc: Oral  Height: 5' 2" (1.575  m)  Weight: 174 lb (78.926 kg)  SpO2: 94%   Body mass index is 31.82 kg/(m^2).  Physical Exam  Physical Exam  Constitutional: She is oriented to person, place, and time and well-developed, well-nourished, and in no distress. No distress.  HENT:  Head: Normocephalic and atraumatic.  Eyes: Conjunctivae are normal.  Neck: Neck supple. No thyromegaly present.  Cardiovascular: Normal rate, regular rhythm and normal heart sounds.   No murmur heard. Pulmonary/Chest: Effort normal and breath sounds normal. She has no wheezes.  Abdominal: She exhibits no distension and no mass.  Musculoskeletal: She exhibits no edema.  Lymphadenopathy:    She has no cervical adenopathy.  Neurological: She is alert and oriented to person, place, and time.  Skin: Skin is warm and dry. No rash noted. She is not diaphoretic.  Psychiatric: Memory, affect and judgment normal.    Lab Results  Component Value Date   TSH 0.918 11/11/2013   Lab Results  Component Value Date   WBC 4.5 09/01/2014   HGB 11.1* 09/01/2014   HCT 35.0 09/01/2014   MCV 90.4 09/01/2014   PLT 132* 09/01/2014   No results found for: EGFR, GFR Lab Results  Component Value Date   CHOL 142 07/19/2013   Lab Results  Component Value Date   HDL 48 07/19/2013   Lab Results  Component Value Date   LDLCALC 77  07/19/2013   Lab Results  Component Value Date   TRIG 87 07/19/2013   Lab Results  Component Value Date   CHOLHDL 3.0 07/19/2013   No results found for: HGBA1C    Assessment & Plan   Anxiety and depression no changes but with long history working with psychiatry is referred back there for further consideration at this time  Primary cancer of upper inner quadrant of left female breast S/p chemo and radiation. Doing fairly well but with peripheral neuropathy as a results  Hereditary and idiopathic peripheral neuropathy Improving, began s/p recent Breast Cancer treatments with chemo and radiation

## 2015-07-11 ENCOUNTER — Other Ambulatory Visit: Payer: Self-pay | Admitting: Hematology and Oncology

## 2015-07-13 NOTE — Telephone Encounter (Signed)
Last ov 03/13/15.  Next ov 09/21/15.  Chart reveiwed.

## 2015-07-18 ENCOUNTER — Encounter: Payer: Self-pay | Admitting: Family Medicine

## 2015-07-18 NOTE — Assessment & Plan Note (Signed)
S/p chemo and radiation. Doing fairly well but with peripheral neuropathy as a results

## 2015-07-18 NOTE — Assessment & Plan Note (Signed)
Improving, began s/p recent Breast Cancer treatments with chemo and radiation

## 2015-07-18 NOTE — Assessment & Plan Note (Addendum)
no changes but with long history working with psychiatry is referred back there for further consideration at this time

## 2015-08-30 ENCOUNTER — Ambulatory Visit: Payer: BC Managed Care – PPO | Attending: General Surgery | Admitting: Physical Therapy

## 2015-08-30 ENCOUNTER — Encounter: Payer: Self-pay | Admitting: Physical Therapy

## 2015-08-30 DIAGNOSIS — N644 Mastodynia: Secondary | ICD-10-CM | POA: Insufficient documentation

## 2015-08-30 DIAGNOSIS — I890 Lymphedema, not elsewhere classified: Secondary | ICD-10-CM | POA: Insufficient documentation

## 2015-08-30 NOTE — Therapy (Addendum)
[place], [name], [address] Phone: [phone]   Fax:  [phone]  Physical Therapy Evaluation  Patient Details  Name: Kristi [name] MRN: [id number] Date of Birth: 31-May-1956 Referring Provider: Dr. Stark Klein  Encounter Date: 08/30/2015      PT End of Session - 08/30/15 1546    Visit Number 1   Number of Visits 8   Date for PT Re-Evaluation 09/27/15   PT Start Time 1300   PT Stop Time 1345   PT Time Calculation (min) 45 min   Activity Tolerance Patient tolerated treatment well   Behavior During Therapy [facility] for tasks assessed/performed      Past Medical History  Diagnosis Date  . Anxiety   . Hypertension   . MRSA (methicillin resistant Staphylococcus aureus) 2009    right groin area-no issues now. 04-07-14 PCR screen negative today.  . Depression   . Anemia     Iron deficinecy anemia  . History of radiation therapy 09/09/13-10/28/13    45 gray to left breast, lumpectomy cavity boosted to 63 gray  . Neuropathy ([place])   . Arthritis   . History of chicken pox   . Breast cancer ([place])     left ,last radiation 2'15, last chemo 8'14  . Anxiety and depression 05/15/2014    Past Surgical History  Procedure Laterality Date  . Anal sphincterotomy  04/2011  . Hemorrhoid surgery  04/2011    ligation  . Breast lumpectomy with needle localization Left 02/04/2013    Procedure: LEFT BREAST LUMPECTOMY WITH NEEDLE LOCALIZATION;  Surgeon: Stark Klein, MD;  Location: [place];  Service: General;  Laterality: Left;  . Axillary lymph node dissection Left 02/04/2013    Procedure: LEFT AXILLARY LYMPH NODE DISSECTION;  Surgeon: Stark Klein, MD;  Location: [place];  Service: General;  Laterality: Left;  End: 0712  . Portacath placement Right 02/04/2013    Procedure: INSERTION PORT-A-CATH;  Surgeon: Stark Klein, MD;  Location: [place];  Service: General;  Laterality: Right;  Start Time: 1975.  Marland Kitchen Appendectomy  1980  .  Breast surgery      Lumpectomy in april 2014  . Shoulder arthroscopy with rotator cuff repair and subacromial decompression Left 02/24/2014    Procedure: SHOULDER ARTHROSCOPY WITH ROTATOR CUFF REPAIR AND SUBACROMIAL DECOMPRESSION;  Surgeon: Meredith Pel, MD;  Location: [facility];  Service: Orthopedics;  Laterality: Left;  LEFT SHOULDER DIAGNOSTIC OPERATIVE ARTHROSCOPY, SUBACROMIAL DECOMPRESSION, ROTATOR CUFF TEAR REPAIR  . Port-a-cath removal N/A 04/16/2014    Procedure: REMOVAL PORT-A-CATH;  Surgeon: Stark Klein, MD;  Location: [facility];  Service: General;  Laterality: N/A;    There were no vitals filed for this visit.  Visit Diagnosis:  Breast pain - Plan: PT plan of care cert/re-cert  Lymphedema of breast - Plan: PT plan of care cert/re-cert      Subjective Assessment - 08/30/15 1311    Subjective Patient has had left breast swelling since 05/14/14 when diagnosed with mastitis following a left lumpectomy with an axillary lymph node dissection.   Pertinent History Left lumpectomy and axillary lymph node dissection 02/04/13 followed by chemotherapy and radiation to left breast and axilla.  She has struggled significantly with bilateral feet neuropathy, anxiety and depression.   Patient Stated Goals Reduce breast swelling   Currently in Pain? Yes   Pain Score 1    Pain Location Breast   Pain Orientation Left   Pain Descriptors / Indicators Sore   Pain Type  Chronic pain   Pain Onset More than a month ago   Pain Frequency Intermittent   Aggravating Factors  driving and keeping arm up for long periods   Pain Relieving Factors unknown   Multiple Pain Sites No            [facility] PT Assessment - 08/30/15 0001    Assessment   Medical Diagnosis Left breast lymphedema   Referring Provider Dr. Stark Klein   Onset Date/Surgical Date 05/14/14   Hand Dominance Left   Prior Therapy yes - PT 2015   Precautions   Precautions Other (comment);Fall  Hx left breast cancer   Restrictions    Weight Bearing Restrictions No   Balance Screen   Has the patient fallen in the past 6 months No  No falls but c/o stumbling due to CIPN   Has the patient had a decrease in activity level because of a fear of falling?  No   Is the patient reluctant to leave their home because of a fear of falling?  No   Home [place] residence   Living Arrangements Spouse/significant other   Available Help at [place] to enter   Home Layout Two level   Prior Function   Level of [place] On disability  Due to CIPN and mental health per her report   Leisure She recently bought a recumbant bike but otherwise no exercise   Cognition   Overall Cognitive Status Within Functional Limits for tasks assessed   Observation/Other Assessments   Other Surveys  --  Lymphedema Life Impact Scale score 38% impaired   Posture/Postural Control   Posture/Postural Control Postural limitations   Postural Limitations Rounded Shoulders;Forward head   ROM / Strength   AROM / PROM / Strength AROM   AROM   Overall AROM  Within functional limits for tasks performed   Palpation   Palpation comment Visible and palpable swelling present in left breast with stretch marks evident due to increase in breast size.  Left breast sits more superior than right due to edema.  Also c/o tenderness to palpation on inferior breast where swelling is most prominent.           LYMPHEDEMA/ONCOLOGY QUESTIONNAIRE - 08/30/15 1322    Type   Cancer Type Left breast   Surgeries   Lumpectomy Date 02/04/13   Axillary Lymph Node Dissection Date 02/04/13   Number Lymph Nodes Removed 19   Date Lymphedema/Swelling Started   Date 05/14/14   Treatment   Active Chemotherapy Treatment No   Past Chemotherapy Treatment Yes   Active Radiation Treatment No   Past Radiation Treatment Yes   Current Hormone Treatment Yes   Drug Name Tamoxifen   What other symptoms  do you have   Are you Having Heaviness or Tightness Yes   Are you having Pain Yes   Are you having pitting edema No   Is it Hard or Difficult finding clothes that fit Yes   Do you have infections Yes   Comments 1 mastitis infection 05/14/14          Long Term Clinic Goals - 08/30/15 1600    CC Long Term Goal  #1   Title Patient will report >/= 25% less discomfort in left breast.   Time 4   Period Weeks   Status New   CC Long Term Goal  #2   Title Patient will demonstrate safe and  roper use of Flexitouch pump for breast swelling.   Time 4   Period Weeks   Status New   CC Long Term Goal  #3   Title Patient will verbalize understanding of how to be fitted for a comrpession bra.   Time 4   Period Weeks   Status New            Plan - 08/30/15 1552    Clinical Impression Statement Patient is a pleasant woman who was treated last year for left breast swelling.  She received extensive therapy including manual lymph drainage and a compression pump (Flexitouch).  She reports she has been unable to perform her own massage and the pump is difficult to use and sometimes causes pain.  She will  benefit from further instructions in use fo the pump and from getting a good compression bra.  We will assist her in those things and if she would like, we can re-initiate manual lymph drainage.  We will make that decision after she gets a compression bra and further training in the pump;   Pt will benefit from skilled therapeutic intervention in order to improve on the following deficits Decreased knowledge of use of DME;Increased edema   Rehab Potential Good   Clinical Impairments Affecting Rehab Potential Difficulty understanding use of equipment   PT Frequency 2x / week   PT Duration 4 weeks   PT Treatment/Interventions ADLs/Self Care Home Management;Manual lymph drainage;Manual techniques   PT Next Visit Plan E-mailed Carolyn to set up fitting for compression bra; will contact [facility] about further training; will contact pt to f/u on coordinating those training sessions   Consulted and Agree with Plan of Care Patient         Problem List Patient Active Problem List   Diagnosis Date Noted  . History of chicken pox   . Shingles 05/15/2014  . Anxiety and depression 05/15/2014  . Breast pain 05/15/2014  . Rotator cuff tear 02/24/2014  . Hot flashes due to tamoxifen 02/20/2014  . Left shoulder pain with abduction 12/12/2013  . Hereditary and idiopathic peripheral neuropathy 11/11/2013  . CVA (cerebral infarction) 07/18/2013  . TIA (transient ischemic attack) 07/18/2013  . Contracture of axilla 05/20/2013  . Primary cancer of upper inner quadrant of left female breast ([place]) 01/24/2013  . Anal fissure 05/03/2011  . Hemorrhoids, internal, with prolapse 05/03/2011    Annia Friendly, PT 08/30/2015 4:08 PM  [place], [name], [address] Phone: [phone]   Fax:  [phone]  Name: Kristi [name] MRN: [id number] Date of Birth: Oct 18, 1956   PHYSICAL THERAPY DISCHARGE SUMMARY  Visits from Start of Care: 1  Current functional level related to goals / functional outcomes: When I last spoke to the patient, she was undergoing further training in the [place] and getting a compression bra.  She did not want to undergo further manual lymph drainage.   Remaining deficits: unknown   Education / Equipment: Education on where and how to be fitted for a compression bra and re-education on using her Flexitouch.  Plan: Patient agrees to discharge.  Patient goals were not met. Patient is being discharged due to the patient's request.  ?????       Annia Friendly, [place] 01/06/2016 8:46 AM

## 2015-09-09 ENCOUNTER — Ambulatory Visit: Payer: BC Managed Care – PPO | Admitting: Radiation Oncology

## 2015-09-11 ENCOUNTER — Other Ambulatory Visit: Payer: Self-pay | Admitting: Hematology and Oncology

## 2015-09-13 ENCOUNTER — Other Ambulatory Visit: Payer: Self-pay | Admitting: *Deleted

## 2015-09-13 DIAGNOSIS — C50212 Malignant neoplasm of upper-inner quadrant of left female breast: Secondary | ICD-10-CM

## 2015-09-13 MED ORDER — GABAPENTIN 800 MG PO TABS: 800.0000 mg | ORAL_TABLET | Freq: Three times a day (TID) | ORAL | Status: DC

## 2015-09-16 ENCOUNTER — Ambulatory Visit
Admission: RE | Admit: 2015-09-16 | Discharge: 2015-09-16 | Disposition: A | Discharge status: Home / Self Care | Payer: BC Managed Care – PPO | Source: Ambulatory Visit | Attending: Radiation Oncology | Admitting: Radiation Oncology

## 2015-09-16 VITALS — BP 95/57 | HR 82 | Temp 98.0°F | Resp 16 | Ht 62.0 in | Wt 174.7 lb

## 2015-09-16 DIAGNOSIS — C50212 Malignant neoplasm of upper-inner quadrant of left female breast: Secondary | ICD-10-CM

## 2015-09-16 NOTE — Progress Notes (Signed)
Radiation Oncology         [phone] ________________________________  Name: Kristi [name] MRN: [id number]  Date: 09/16/2015  DOB: Jun 29, 1956  Follow-Up Visit Note  CC: [name], MD  [name], MD   Diagnosis:   Multifocal invasive ductal carcinoma of the left breast (mpT1c, pN1a, pMx)   Interval Since Last Radiation: 1 year and 11 months. November 4 through October 28, 2013  Site/dose:   Left breast, 45 gray in 25 fractions, the lumpectomy cavity area was boosted to a cumulative dose of 63 gray  Narrative:  The patient returns today for routine follow-up. She reports mild pain in her left breast due to lymphedema. She started using a lymphedema pump four months ago. She is taking Tamoxifen. She reports her knees are "bothering her." She reports having fatigue. The nurse noted that the skin on her left breast has hyperpigmentation and appears slightly swollen. She denies discomfort or swelling of the right breast or axillary regions.  ALLERGIES:  has No Known Allergies.  Meds: Current Outpatient Prescriptions  Medication Sig Dispense Refill  . ALPRAZolam (XANAX) 1 MG tablet Take 1-2 mg by mouth 2 (two) times daily as needed for anxiety.     . Ascorbic Acid (VITAMIN C) 1000 MG tablet Take 1,000 mg by mouth daily.    Marland Kitchen aspirin EC 81 MG tablet Take 162 mg by mouth daily.    . B Complex-C (SUPER B COMPLEX PO) Take 1 tablet by mouth daily.    . clonazePAM (KLONOPIN) 1 MG tablet Take 1 mg by mouth 3 (three) times daily as needed for anxiety.    . DULoxetine (CYMBALTA) 60 MG capsule Take 60 mg by mouth 3 (three) times daily.     Marland Kitchen gabapentin (NEURONTIN) 800 MG tablet Take 1 tablet (800 mg total) by mouth 3 (three) times daily. 90 tablet 2  . L-Methylfolate-Algae-B12-B6 (METANX) 3-90.314-2-35 MG CAPS   2  . Multiple Vitamin (MULTIVITAMIN WITH MINERALS) TABS tablet Take 1 tablet by mouth daily.    . tamoxifen (NOLVADEX) 20 MG tablet Take 1 tablet (20 mg total) by mouth  daily. 30 tablet 6  . traMADol (ULTRAM) 50 MG tablet   2  . valsartan-hydrochlorothiazide (DIOVAN-HCT) 320-25 MG per tablet Take 1 tablet by mouth every morning.     . [DISCONTINUED] prochlorperazine (COMPAZINE) 10 MG tablet Take 1 tablet (10 mg total) by mouth every 6 (six) hours as needed (Nausea or vomiting). 30 tablet 1  . [DISCONTINUED] prochlorperazine (COMPAZINE) 25 MG suppository Place 1 suppository (25 mg total) rectally every 12 (twelve) hours as needed for nausea. 12 suppository 3   No current facility-administered medications for this encounter.    Physical Findings: Vitals with BMI 09/16/2015  Height 5\' 2"   Weight 174 lbs 11 oz  BMI 32  Systolic 95  Diastolic 57  Pulse 82  Respirations 16  The patient is in no acute distress. Patient is alert and oriented. Lungs are clear to auscultation bilaterally. Heart has regular rate and rhythm. No palpable cervical, supraclavicular, or axillary adenopathy. Right breast exam noted for no palpable mass or nipple discharge. Left breast some continued edema, non-tender with palpation. No dominant mass in the left breast.  Lab Findings: Lab Results  Component Value Date   WBC 4.5 09/01/2014   HGB 11.1* 09/01/2014   HCT 35.0 09/01/2014   MCV 90.4 09/01/2014   PLT 132* 09/01/2014    Radiographic Findings: The mammogram at [facility] on January 27, 2015 shows no evidence  of recurrence or problems in the right breast.  Impression:  No evidence of recurrence on clinical exam today. Continued edema of the left breast.  Plan:  Routine followup in 6 months. She is to f/u with Dr. [name] on 09/21/15 and Dr. Charlett Blake on 10/11/15. Encouraged patient to continue exercises and use her Flexitouch machine as she was instructed from the lymphedema clinic. I also encouraged her to continue her yearly mammograms. I advised the patient that she could use moisturizer over the treatment area and massage the area to help with discomfort. The patient inquired about  a refill on her Tramadol. I notified her of the potiential interaction with Cymbalta and that she should use either Tylenol, advil or Aleve for her breast discomfort. ____________________________________ Blair Promise, PhD., MD  This document serves as a record of services personally performed by Gery Pray, MD. It was created on his behalf by Darcus Austin, a trained medical scribe. The creation of this record is based on the scribe's personal observations and the provider's statements to them. This document has been checked and approved by the attending provider.    hh

## 2015-09-16 NOTE — Progress Notes (Signed)
Kristi [name] here for follow up.  She reports pain in her left breast due to lymphedema.  She started using a lymphedema pump four months ago.  She is taking Tamoxifen.  She reports having fatigue.  The skin on her left breast has hyperpigmentation and appears slightly swollen.  BP 95/57 mmHg  Pulse 82  Temp(Src) 98 F (36.7 C) (Oral)  Resp 16  Ht 5\' 2"  (1.575 m)  Wt 174 lb 11.2 oz (79.243 kg)  BMI 31.94 kg/m2  LMP 01/20/2013

## 2015-09-21 ENCOUNTER — Telehealth: Payer: Self-pay | Admitting: Hematology and Oncology

## 2015-09-21 ENCOUNTER — Ambulatory Visit (HOSPITAL_BASED_OUTPATIENT_CLINIC_OR_DEPARTMENT_OTHER): Payer: BC Managed Care – PPO | Admitting: Hematology and Oncology

## 2015-09-21 ENCOUNTER — Encounter: Payer: Self-pay | Admitting: Hematology and Oncology

## 2015-09-21 ENCOUNTER — Encounter: Payer: Self-pay | Admitting: *Deleted

## 2015-09-21 VITALS — BP 110/60 | HR 76 | Temp 97.9°F | Resp 17 | Ht 62.0 in | Wt 175.2 lb

## 2015-09-21 DIAGNOSIS — G620 Drug-induced polyneuropathy: Secondary | ICD-10-CM | POA: Diagnosis not present

## 2015-09-21 DIAGNOSIS — Z170 Estrogen receptor positive status [ER+]: Secondary | ICD-10-CM

## 2015-09-21 DIAGNOSIS — C50212 Malignant neoplasm of upper-inner quadrant of left female breast: Secondary | ICD-10-CM | POA: Diagnosis not present

## 2015-09-21 DIAGNOSIS — I890 Lymphedema, not elsewhere classified: Secondary | ICD-10-CM | POA: Diagnosis not present

## 2015-09-21 DIAGNOSIS — F329 Major depressive disorder, single episode, unspecified: Secondary | ICD-10-CM

## 2015-09-21 DIAGNOSIS — G5692 Unspecified mononeuropathy of left upper limb: Secondary | ICD-10-CM

## 2015-09-21 HISTORY — DX: Unspecified mononeuropathy of left upper limb: G56.92

## 2015-09-21 NOTE — Progress Notes (Signed)
Patient Care Team: Mosie Lukes, MD as PCP - General (Family Medicine) Nicholas Lose, MD as Consulting Physician (Hematology and Oncology) Johnn Hai, MD as Referring Physician (Psychiatry) Lin Landsman, MD as Consulting Physician (Family Medicine) Inocencio Homes, DPM as Consulting Physician (Podiatry) Gery Pray, MD as Consulting Physician (Radiation Oncology)  DIAGNOSIS: Primary cancer of upper inner quadrant of left female breast [facility])   Staging form: Breast, AJCC 7th Edition     Clinical: Stage IA (T1c, N0, cM0) - Unsigned She is 19(      Staging comments: Staged at breast conference 3.26.14      Pathologic: No stage assigned - Unsigned   SUMMARY OF ONCOLOGIC HISTORY:   Primary cancer of upper inner quadrant of left female breast ([place])   01/24/2013 Initial Diagnosis Ultrasound: Left breast no position 1 x 0.7 cm, 12:00 position 1.6 x 0.9 cm in addition 2.1 cm axillary lymph node: Biopsies of breast and axilla were positive for IDC high-grade;  ER weakly positive, PR negative, HER-2 negative   02/04/2013 Surgery Left breast lumpectomy: 1.6 cm tumor 1/19 axillary lymph nodes positive, ER 6%, PR 0%, HER-2 negative, Ki-67 90%   03/05/2013 - 08/21/2013 Chemotherapy Adjuvant chemotherapy with Adriamycin and Cytoxan followed by Taxol and [place] x9 followed by [place] x2   09/09/2013 - 10/28/2013 Radiation Therapy Adjuvant radiation therapy   11/13/2013 -  Anti-estrogen oral therapy Tamoxifen 20 mg daily    CHIEF COMPLIANT: Emotional distress and tearfulness  INTERVAL HISTORY: Kristi [name] is a 59 year old with above-mentioned history left breast cancer was been on tamoxifen therapy since January 2015. She came to the clinic today very emotionally distressed and tearful. Her daughter reports that this is being how it's been going for the past several months. She sees a psychiatrist and has an appointment on December 1 with them. They plan to change her antibiotic medications and start from  scratch. Some of the underlying reasons for her depression symptoms are related to left breast lymphedema as well as neuropathy which is not improving completely. She has gone to physical therapy for the left breast lymphedema but still it has not markedly improved.  REVIEW OF SYSTEMS:   Constitutional: Denies fevers, chills or abnormal weight loss Eyes: Denies blurriness of vision Ears, nose, mouth, throat, and face: Denies mucositis or sore throat Respiratory: Denies cough, dyspnea or wheezes Cardiovascular: Denies palpitation, chest discomfort or lower extremity swelling Gastrointestinal:  Denies nausea, heartburn or change in bowel habits Skin: Denies abnormal skin rashes Lymphatics: Denies new lymphadenopathy or easy bruising Neurological:Denies numbness, tingling or new weaknesses Behavioral/Psymajor depression and tearfulness  Left breast lymphedemal other systems were reviewed with the patient and are negative.  I have reviewed the past medical history, past surgical history, social history and family history with the patient and they are unchanged from previous note.  ALLERGIES:  has No Known Allergies.  MEDICATIONS:  Current Outpatient Prescriptions  Medication Sig Dispense Refill  . ALPRAZolam (XANAX) 1 MG tablet Take 1-2 mg by mouth 2 (two) times daily as needed for anxiety.     . Ascorbic Acid (VITAMIN C) 1000 MG tablet Take 1,000 mg by mouth daily.    Marland Kitchen aspirin EC 81 MG tablet Take 162 mg by mouth daily.    . B Complex-C (SUPER B COMPLEX PO) Take 1 tablet by mouth daily.    . clonazePAM (KLONOPIN) 1 MG tablet Take 1 mg by mouth 3 (three) times daily as needed for anxiety.    Marland Kitchen  DULoxetine (CYMBALTA) 60 MG capsule Take 60 mg by mouth 3 (three) times daily.     Marland Kitchen gabapentin (NEURONTIN) 800 MG tablet Take 1 tablet (800 mg total) by mouth 3 (three) times daily. 90 tablet 2  . L-Methylfolate-Algae-B12-B6 (METANX) 3-90.314-2-35 MG CAPS   2  . Multiple Vitamin (MULTIVITAMIN WITH  MINERALS) TABS tablet Take 1 tablet by mouth daily.    . tamoxifen (NOLVADEX) 20 MG tablet Take 1 tablet (20 mg total) by mouth daily. 30 tablet 6  . traMADol (ULTRAM) 50 MG tablet   2  . valsartan-hydrochlorothiazide (DIOVAN-HCT) 320-25 MG per tablet Take 1 tablet by mouth every morning.     . [DISCONTINUED] prochlorperazine (COMPAZINE) 10 MG tablet Take 1 tablet (10 mg total) by mouth every 6 (six) hours as needed (Nausea or vomiting). 30 tablet 1  . [DISCONTINUED] prochlorperazine (COMPAZINE) 25 MG suppository Place 1 suppository (25 mg total) rectally every 12 (twelve) hours as needed for nausea. 12 suppository 3   No current facility-administered medications for this visit.    PHYSICAL EXAMINATION: ECOG PERFORMANCE STATUS: 2 - Symptomatic, <50% confined to bed  Filed Vitals:   09/21/15 1343  BP: 110/60  Pulse: 76  Temp: 97.9 F (36.6 C)  Resp: 17   Filed Weights   09/21/15 1343  Weight: 175 lb 3.2 oz (79.47 kg)    GENERAL:alert, no distress and comfortable SKIN: skin color, texture, turgor are normal, no rashes or significant lesions EYES: normal, Conjunctiva are pink and non-injected, sclera clear OROPHARYNX:no exudate, no erythema and lips, buccal mucosa, and tongue normal  NECK: supple, thyroid normal size, non-tender, without nodularity LYMPH:  no palpable lymphadenopathy in the cervical, axillary or inguinal LUNGS: clear to auscultation and percussion with normal breathing effort HEART: regular rate & rhythm and no murmurs and no lower extremity edema ABDOMEN:abdomen soft, non-tender and normal bowel sounds Musculoskeletal:no cyanosis of digits and no clubbing  NEURO: alert & oriented x 3 with fluent speech, neuropathy in the feet and her right arm   LABORATORY DATA:  I have reviewed the data as listed   Chemistry      Component Value Date/Time   NA 141 09/01/2014 1525   NA 141 05/14/2014 1822   K 3.6 09/01/2014 1525   K 3.6* 05/14/2014 1822   CL 101  05/14/2014 1822   CL 100 04/23/2013 1447   CO2 28 09/01/2014 1525   CO2 30 05/14/2014 1822   BUN 13.3 09/01/2014 1525   BUN 12 05/14/2014 1822   CREATININE 0.8 09/01/2014 1525   CREATININE 0.78 05/14/2014 1822      Component Value Date/Time   CALCIUM 9.4 09/01/2014 1525   CALCIUM 9.7 05/14/2014 1822   ALKPHOS 62 09/01/2014 1525   ALKPHOS 69 05/14/2014 1822   AST 19 09/01/2014 1525   AST 22 05/14/2014 1822   ALT 17 09/01/2014 1525   ALT 16 05/14/2014 1822   BILITOT 0.22 09/01/2014 1525   BILITOT 0.2* 05/14/2014 1822       Lab Results  Component Value Date   WBC 4.5 09/01/2014   HGB 11.1* 09/01/2014   HCT 35.0 09/01/2014   MCV 90.4 09/01/2014   PLT 132* 09/01/2014   NEUTROABS 2.0 09/01/2014   ASSESSMENT & PLAN:  Primary cancer of upper inner quadrant of left female breast Multifocal invasive ductal carcinoma grade 3 ER weakly positive at 3% PR negative HER-2/neu negative with Ki-67 of 90% status post lumpectomy 02/04/2013 1.6 and the tumor one out of 19 lymph nodes  positive status post adjuvant a.c. followed by Taxol carboplatin later switched to [place] Gemzar completed 08/21/2013, status post adjuvant radiation, currently on tamoxifen started 11/13/2013  Left breast lymphedema: received physical therapy Neuropathy related to prior chemotherapy: under neurology care. Her symptoms are not getting any better with time.   Major depression: Patient is seeing psychiatry. She has no appointment on December 1 to see nurse practitioner the psychiatrist's office. They plan to remove all existing medications and start from scratch. Patient was tearful during the entire visit. I recommended discontinuing tamoxifen until January 2017. This will hopefully help [place] assess of tamoxifen is making her depression worse. If her symptoms improved by stopping tamoxifen, we can prescribe anastrozole. I would like to see her back in January for follow-up.  Breast Cancer Surveillance: 1. Breast exam   09/21/2015: Normal 2. Mammogram 01/27/2015 No abnormalities. Postsurgical changes. Breast Density Category B. I recommended that she get 3-D mammograms for surveillance. Discussed the differences between different breast density categories.   Return to clinic in January 2017 for follow-up to assess if tamoxifen should be resumed or if treatment should be changed to anastrozole  No orders of the defined types were placed in this encounter.   The patient has a good understanding of the overall plan. she agrees with it. she will call with any problems that may develop before the next visit here.   Rulon Eisenmenger, MD 09/21/2015

## 2015-09-21 NOTE — Addendum Note (Signed)
Addended by: Jonelle Sports K on: 09/21/2015 03:00 PM   Modules accepted: Medications

## 2015-09-21 NOTE — Telephone Encounter (Signed)
Appointments made and avs printed for patient °

## 2015-09-21 NOTE — Assessment & Plan Note (Signed)
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 followed by Taxol carboplatin later switched to [place] completed 08/21/2013, status post adjuvant radiation, currently on tamoxifen started 11/13/2013  Left breast lymphedema: received physical therapy Neuropathy related to prior chemotherapy: under neurology care , also see psychiatry to help with her pain issues and emotional issues  Tamoxifen toxicities:  Breast Cancer Surveillance: 1. Breast exam  09/21/2015: Normal 2. Mammogram 01/27/2015 No abnormalities. Postsurgical changes. Breast Density Category B. I recommended that she get 3-D mammograms for surveillance. Discussed the differences between different breast density categories.   Return to clinic in 1 year for follow-up

## 2015-09-21 NOTE — Progress Notes (Signed)
Met with pt to discuss support services. Discussed support group and FYNN. Pt agree to sign up for [facility] in Feb. Reached out to chaplain and SW to call pt and discuss ways to help her accept latent s/e from treatment. Gave pt emotional support and encouragement as well as contact information.

## 2015-09-22 ENCOUNTER — Encounter: Payer: Self-pay | Admitting: *Deleted

## 2015-09-22 NOTE — Progress Notes (Signed)
[place] Work  Clinical Social Work was referred by patient navigator for assessment of psychosocial needs.  Clinical Social Worker attempted to contact patient at home to offer support and assess for needs.  CSW left message with CSW contact information. Per chart review, pt has follow up with psych on 10/07/15. CSW awaits return call.   Loren Racer, [place] Worker [place]  [place] Phone: [phone] Fax: [phone]

## 2015-10-06 ENCOUNTER — Telehealth: Payer: Self-pay | Admitting: Family Medicine

## 2015-10-06 NOTE — Telephone Encounter (Signed)
Patient called back and decided to schedule appointment to get her Flu Shot.

## 2015-10-06 NOTE — Telephone Encounter (Signed)
Called patient to schedule Flu Shot and patient declined because she states she has not had a Flu Shot in the past 2 years.

## 2015-10-07 ENCOUNTER — Ambulatory Visit: Payer: BC Managed Care – PPO

## 2015-10-11 ENCOUNTER — Ambulatory Visit: Payer: BC Managed Care – PPO | Admitting: Family Medicine

## 2015-10-13 ENCOUNTER — Telehealth: Payer: Self-pay | Admitting: Hematology and Oncology

## 2015-10-13 ENCOUNTER — Other Ambulatory Visit: Payer: Self-pay | Admitting: *Deleted

## 2015-10-13 ENCOUNTER — Telehealth: Payer: Self-pay | Admitting: *Deleted

## 2015-10-13 NOTE — Telephone Encounter (Signed)
Heinz Knuckles Attending Physicians statement 26pgs tio [phone]

## 2015-10-13 NOTE — Telephone Encounter (Signed)
Patient called wanting to know if she was ok to take Loxapine which is being prescribed by her psychiatrist. Discussed with Dr. [name] and he stated it was ok for her to take. Called patient and advised of this, she verbalized understanding.

## 2015-10-14 ENCOUNTER — Encounter: Payer: Self-pay | Admitting: Hematology and Oncology

## 2015-10-14 NOTE — Progress Notes (Signed)
Faxed forms to [place] [phone] on 10/12/15. Forward copy to medical records.

## 2015-10-15 ENCOUNTER — Other Ambulatory Visit: Payer: Self-pay | Admitting: Hematology and Oncology

## 2015-10-15 DIAGNOSIS — C50212 Malignant neoplasm of upper-inner quadrant of left female breast: Secondary | ICD-10-CM

## 2015-10-20 ENCOUNTER — Telehealth: Payer: Self-pay | Admitting: Family Medicine

## 2015-10-20 NOTE — Telephone Encounter (Signed)
Relation to [id number] Call back [phone]   Reason for call:  Patient would like to discuss clonazePAM (KLONOPIN) 1 MG tablet and loxapine patient states psychatrist prescribed from [facility]. Patient states she has reached out to [facility] and no one has returned call, patient in need of clinical advice but cant wait until January 6th for an appointment.

## 2015-10-21 MED ORDER — ALPRAZOLAM 1 MG PO TABS: 1.0000 mg | ORAL_TABLET | Freq: Two times a day (BID) | ORAL | Status: DC | PRN

## 2015-10-21 NOTE — Telephone Encounter (Signed)
She got the clonazepam so she should.talk to them about the change

## 2015-10-21 NOTE — Telephone Encounter (Signed)
Please clarify what her concern or question is so I can address.

## 2015-10-21 NOTE — Telephone Encounter (Signed)
Printed Alprazolam and [facility] the patient informed of PCP instructions.  Did give her Dr. Arvil Persons telephone number to call and schedule her appt.

## 2015-10-21 NOTE — Addendum Note (Signed)
Addended by: Sharon Seller B on: 10/21/2015 11:54 AM   Modules accepted: Orders

## 2015-10-21 NOTE — Telephone Encounter (Signed)
Called the patient and she states neither clonazepam and loxapin are not working for her anxiety/depression.  She took a alprazolam this am to see if that helps.  She is asking advise for alternative

## 2015-10-21 NOTE — Telephone Encounter (Signed)
Pt returning call stating that she doesn't want to see the same doctors [facility], [address], [place], [place] [address]). The provider she was seeing before left the office and now has seen the PA whom she doesn't care for. She is wanting to know what Dr. Charlett Blake can do and to get a referral to a new provider. I advised pt to call their office about the meds. She said she called their office and left a message this morning but has not heard back from them.

## 2015-10-21 NOTE — Telephone Encounter (Signed)
I am willing to allow her Alprazolam same strength, same sig only #20 as a temporary way to get her through the holidays but she needs to establish with a new psychaitrist. She could try Dr Caprice Beaver, psychiatry, she has to call herself but we can dig up the phone number if she needs it.

## 2015-10-21 NOTE — Telephone Encounter (Signed)
Patient informed of PCP instructions. 

## 2015-11-04 ENCOUNTER — Ambulatory Visit (INDEPENDENT_AMBULATORY_CARE_PROVIDER_SITE_OTHER): Payer: BC Managed Care – PPO | Admitting: Medical

## 2015-11-04 ENCOUNTER — Encounter: Payer: Self-pay | Admitting: Medical

## 2015-11-04 VITALS — BP 112/68 | HR 88 | Temp 97.7°F | Ht 62.0 in | Wt 176.4 lb

## 2015-11-04 DIAGNOSIS — J069 Acute upper respiratory infection, unspecified: Secondary | ICD-10-CM

## 2015-11-04 MED ORDER — AZITHROMYCIN 250 MG PO TABS: ORAL_TABLET | ORAL | Status: DC

## 2015-11-04 MED ORDER — BENZONATATE 100 MG PO CAPS: 100.0000 mg | ORAL_CAPSULE | Freq: Three times a day (TID) | ORAL | Status: DC | PRN

## 2015-11-04 MED ORDER — FLUTICASONE PROPIONATE 50 MCG/ACT NA SUSP: 2.0000 | Freq: Every day | NASAL | Status: DC

## 2015-11-04 NOTE — Patient Instructions (Signed)
You presently have uri type symptoms vs allergies. With your duration of symptom and upcoming long holiday weekend, I am concerned you symptoms could worsen and may develop sinus infection, bronchitis or other bacterial infection.   Currently will rx flonase for nasal congestion and benzonatate for cough. If you worsen over holiday weekend then I want you to start azithromycin.   Follow up in 7 days or as needed

## 2015-11-04 NOTE — Progress Notes (Signed)
Pre visit review using our clinic review tool, if applicable. No additional management support is needed unless otherwise documented below in the visit note. 

## 2015-11-04 NOTE — Progress Notes (Signed)
Subjective:    Patient ID: Kristi [name], female    DOB: October 11, 1956, 59 y.o.   MRN: [id number]  HPI  Pt in with some nasal congestion, faint  sinus pressure, left ear pressure and some mucous drainage from nose. Pt feels some chest congested.  Pt symptom started on Monday. Gradually getting worse.    Review of Systems  Constitutional: Positive for diaphoresis. Negative for fever, chills and fatigue.       Occasional sweaty feel. Then pt states recovering cancer pt. Pt was taken off tamoxifen. Pt not sure if sweating from meds. Or hot flashes per pt?  HENT: Positive for congestion, sinus pressure and sneezing.   Respiratory: Positive for cough. Negative for choking, shortness of breath and wheezing.        Pt has occasional productive cough.   Cardiovascular: Negative for chest pain and palpitations.  Gastrointestinal: Negative for abdominal pain.  Musculoskeletal: Negative for back pain.  Hematological: Negative for adenopathy. Does not bruise/bleed easily.    Past Medical History  Diagnosis Date  . Anxiety   . Hypertension   . MRSA (methicillin resistant Staphylococcus aureus) 2009    right groin area-no issues now. 04-07-14 PCR screen negative today.  . Depression   . Anemia     Iron deficinecy anemia  . History of radiation therapy 09/09/13-10/28/13    45 gray to left breast, lumpectomy cavity boosted to 63 gray  . Neuropathy ([place])   . Arthritis   . History of chicken pox   . Breast cancer ([place])     left ,last radiation 2'15, last chemo 8'14  . Anxiety and depression 05/15/2014    Social History   Social History  . Marital Status: Married    Spouse Name: Kristi [name]  . Number of Children: 1  . Years of Education: 12   Occupational History  . [employer]   Social History [name] Topics  . Smoking status: Never Smoker   . Smokeless tobacco: Never Used  . Alcohol Use: No  . Drug Use: No  . Sexual Activity: Yes     Comment: lives with  husband, disability/retirement. [employer], no dietary restrictions   Other Topics Concern  . Not on file   Social History Narrative   Patient is married Kristi [name]) and lives at home with her husband.   Patient has one daughter.   Patient is working at [employer]   Patient has a 12th grade education.   Patient drinks very little caffeine.    Past Surgical History  Procedure Laterality Date  . Anal sphincterotomy  04/2011  . Hemorrhoid surgery  04/2011    ligation  . Breast lumpectomy with needle localization Left 02/04/2013    Procedure: LEFT BREAST LUMPECTOMY WITH NEEDLE LOCALIZATION;  Surgeon: Stark Klein, MD;  Location: [facility];  Service: General;  Laterality: Left;  . Axillary lymph node dissection Left 02/04/2013    Procedure: LEFT AXILLARY LYMPH NODE DISSECTION;  Surgeon: Stark Klein, MD;  Location: [facility];  Service: General;  Laterality: Left;  End: [id number]  . Portacath placement Right 02/04/2013    Procedure: INSERTION PORT-A-CATH;  Surgeon: Stark Klein, MD;  Location: [facility];  Service: General;  Laterality: Right;  Start [id number].  Marland Kitchen Appendectomy  1980  . Breast surgery      Lumpectomy in april 2014  . Shoulder arthroscopy with rotator cuff repair and subacromial decompression Left 02/24/2014    Procedure: SHOULDER ARTHROSCOPY WITH ROTATOR CUFF  REPAIR AND SUBACROMIAL DECOMPRESSION;  Surgeon: Meredith Pel, MD;  Location: [facility];  Service: Orthopedics;  Laterality: Left;  LEFT SHOULDER DIAGNOSTIC OPERATIVE ARTHROSCOPY, SUBACROMIAL DECOMPRESSION, ROTATOR CUFF TEAR REPAIR  . Port-a-cath removal N/A 04/16/2014    Procedure: REMOVAL PORT-A-CATH;  Surgeon: Stark Klein, MD;  Location: [facility];  Service: General;  Laterality: N/A;    Family History  Problem Relation Age of Onset  . Lung cancer Father   . Hypertension Father   . Thyroid cancer Father     dx in his 89s  . Cancer Father     lung, thyroid, smoker  . Breast cancer Paternal Aunt 49  . Colon cancer Paternal Aunt     dx in  her 50x  . Cervical cancer Paternal Aunt     dzx in her 23s  . Ovarian cancer Cousin     dx in her lage 84s  . Breast cancer Cousin     maternal first cousin, once removed; dx in her late 44s  . Breast cancer Cousin     maternal first cousin once removed; dx in late 69s  . Hypertension Mother   . Diabetes Mother   . Dementia Mother   . Hypertension Brother   . Seizures Brother     Alcohol induced.  . Alcohol abuse Brother     drinker, smoker  . Cancer Paternal Uncle     oral cancer  . Kidney cancer Paternal Grandmother   . Cancer Cousin     several paternal cousins with brain cancer, leukemia, and other cancers  . Arthritis Daughter     back surgery    No Known Allergies  Current Outpatient Prescriptions on File Prior to Visit  Medication Sig Dispense Refill  . Ascorbic Acid (VITAMIN C) 1000 MG tablet Take 1,000 mg by mouth daily.    Marland Kitchen aspirin EC 81 MG tablet Take 162 mg by mouth daily.    . B Complex-C (SUPER B COMPLEX PO) Take 1 tablet by mouth daily.    . clonazePAM (KLONOPIN) 1 MG tablet Take 0.5 mg by mouth 2 (two) times daily.     . DULoxetine (CYMBALTA) 60 MG capsule Take 60 mg by mouth 3 (three) times daily.     Marland Kitchen gabapentin (NEURONTIN) 800 MG tablet Take 1 tablet (800 mg total) by mouth 3 (three) times daily. 90 tablet 2  . Multiple Vitamin (MULTIVITAMIN WITH MINERALS) TABS tablet Take 1 tablet by mouth daily.    . valsartan-hydrochlorothiazide (DIOVAN-HCT) 320-25 MG per tablet Take 1 tablet by mouth every morning.     . [DISCONTINUED] prochlorperazine (COMPAZINE) 10 MG tablet Take 1 tablet (10 mg total) by mouth every 6 (six) hours as needed (Nausea or vomiting). 30 tablet 1  . [DISCONTINUED] prochlorperazine (COMPAZINE) 25 MG suppository Place 1 suppository (25 mg total) rectally every 12 (twelve) hours as needed for nausea. 12 suppository 3   No current facility-administered medications on file prior to visit.    BP 112/68 mmHg  Pulse 88  Temp(Src) 97.7 F  (36.5 C) (Oral)  Ht 5\' 2"  (1.575 m)  Wt 176 lb 6.4 oz (80.015 kg)  BMI 32.26 kg/m2  SpO2 96%  LMP 01/20/2013       Objective:   Physical Exam  General  Mental Status - Alert. General Appearance - Well groomed. Not in acute distress.  Skin Rashes- No Rashes.  HEENT Head- Normal. Ear Auditory Canal - Left- Normal. Right - Normal.Tympanic Membrane- Left- Normal. Right- Normal. Eye Sclera/Conjunctiva-  Left- Normal. Right- Normal. Nose & Sinuses Nasal Mucosa- Left-  Boggy and Congested. Right-  Boggy and  Congested.Bilateral no  maxillary and  No frontal sinus pressure. Mouth & Throat Lips: Upper Lip- Normal: no dryness, cracking, pallor, cyanosis, or vesicular eruption. Lower Lip-Normal: no dryness, cracking, pallor, cyanosis or vesicular eruption. Buccal Mucosa- Bilateral- No Aphthous ulcers. Oropharynx- No Discharge or Erythema. +pnd Tonsils: Characteristics- Bilateral- No Erythema or Congestion. Size/Enlargement- Bilateral- No enlargement. Discharge- bilateral-None.  Neck Neck- Supple. No Masses.   Chest and Lung Exam Auscultation: Breath Sounds:-Clear even and unlabored.  Cardiovascular Auscultation:Rythm- Regular, rate and rhythm. Murmurs & Other Heart Sounds:Ausculatation of the heart reveal- No Murmurs.  Lymphatic Head & Neck General Head & Neck Lymphatics: Bilateral: Description- No Localized lymphadenopathy.       Assessment & Plan:  You presently have uri type symptoms vs allergies. With your duration of symptom and upcoming long holiday weekend, I am concerned you symptoms could worsen and may develop sinus infection, bronchitis or other bacterial infection.   Currently will rx flonase for nasal congestion and benzonatate for cough. If you worsen over holiday weekend then I want you to start azithromycin.   Follow up in 7 days or as needed

## 2015-11-16 ENCOUNTER — Encounter: Payer: Self-pay | Admitting: Hematology and Oncology

## 2015-11-16 ENCOUNTER — Telehealth: Payer: Self-pay | Admitting: Hematology and Oncology

## 2015-11-16 ENCOUNTER — Ambulatory Visit (HOSPITAL_BASED_OUTPATIENT_CLINIC_OR_DEPARTMENT_OTHER): Payer: BC Managed Care – PPO | Admitting: Hematology and Oncology

## 2015-11-16 VITALS — BP 112/73 | HR 82 | Temp 98.9°F | Resp 18 | Wt 173.5 lb

## 2015-11-16 DIAGNOSIS — F329 Major depressive disorder, single episode, unspecified: Secondary | ICD-10-CM

## 2015-11-16 DIAGNOSIS — C773 Secondary and unspecified malignant neoplasm of axilla and upper limb lymph nodes: Secondary | ICD-10-CM | POA: Diagnosis not present

## 2015-11-16 DIAGNOSIS — T451X5A Adverse effect of antineoplastic and immunosuppressive drugs, initial encounter: Secondary | ICD-10-CM

## 2015-11-16 DIAGNOSIS — F419 Anxiety disorder, unspecified: Secondary | ICD-10-CM

## 2015-11-16 DIAGNOSIS — I890 Lymphedema, not elsewhere classified: Secondary | ICD-10-CM

## 2015-11-16 DIAGNOSIS — Z170 Estrogen receptor positive status [ER+]: Secondary | ICD-10-CM

## 2015-11-16 DIAGNOSIS — F32A Depression, unspecified: Secondary | ICD-10-CM

## 2015-11-16 DIAGNOSIS — G620 Drug-induced polyneuropathy: Secondary | ICD-10-CM

## 2015-11-16 DIAGNOSIS — C50212 Malignant neoplasm of upper-inner quadrant of left female breast: Secondary | ICD-10-CM | POA: Diagnosis not present

## 2015-11-16 NOTE — Progress Notes (Signed)
Patient Care Team: Mosie Lukes, MD as PCP - General (Family Medicine) Nicholas Lose, MD as Consulting Physician (Hematology and Oncology) Johnn Hai, MD as Referring Physician (Psychiatry) Lin Landsman, MD as Consulting Physician (Family Medicine) Inocencio Homes, DPM as Consulting Physician (Podiatry) Gery Pray, MD as Consulting Physician (Radiation Oncology)  DIAGNOSIS: Primary cancer of upper inner quadrant of left female breast [facility])   Staging form: Breast, AJCC 7th Edition     Clinical: Stage IA (T1c, N0, cM0) - Unsigned       Staging comments: Staged at breast conference 3.26.14      Pathologic: No stage assigned - Unsigned   SUMMARY OF ONCOLOGIC HISTORY:   Primary cancer of upper inner quadrant of left female breast ([place])   01/24/2013 Initial Diagnosis Ultrasound: Left breast no position 1 x 0.7 cm, 12:00 position 1.6 x 0.9 cm in addition 2.1 cm axillary lymph node: Biopsies of breast and axilla were positive for IDC high-grade;  ER weakly positive, PR negative, HER-2 negative   02/04/2013 Surgery Left breast lumpectomy: 1.6 cm tumor 1/19 axillary lymph nodes positive, ER 6%, PR 0%, HER-2 negative, Ki-67 90%   03/05/2013 - 08/21/2013 Chemotherapy Adjuvant chemotherapy with Adriamycin and Cytoxan followed by Taxol and [place] x9 followed by [place] x2   09/09/2013 - 10/28/2013 Radiation Therapy Adjuvant radiation therapy   11/13/2013 -  Anti-estrogen oral therapy Tamoxifen 20 mg daily    CHIEF COMPLIANT: marked improvement in depression  INTERVAL HISTORY: Kristi [name] is a 60 year old with above-mentioned history of left breast cancer currently on adjuvant therapy with tamoxifen. We had to hold tamoxifen therapy because of symptoms of severe depression. She had changed psychiatrists and now she is much improved and reports that the depression is under very good control. Previous appointments she had cried throughout the appointment. Today she is smiling and appears to be happy.  She has not been on tamoxifen for the past 3 months.  REVIEW OF SYSTEMS:   Constitutional: Denies fevers, chills or abnormal weight loss Eyes: Denies blurriness of vision Ears, nose, mouth, throat, and face: Denies mucositis or sore throat Respiratory: Denies cough, dyspnea or wheezes Cardiovascular: Denies palpitation, chest discomfort Gastrointestinal:  Denies nausea, heartburn or change in bowel habits Skin: Denies abnormal skin rashes Lymphatics: Denies new lymphadenopathy or easy bruising Neurological:Denies numbness, tingling or new weaknesses Behavioral/Psych: significant improvement in depression Extremities: No lower extremity edema Breast: complains of left breast lymphedema All other systems were reviewed with the patient and are negative.  I have reviewed the past medical history, past surgical history, social history and family history with the patient and they are unchanged from previous note.  ALLERGIES:  has No Known Allergies.  MEDICATIONS:  Current Outpatient Prescriptions  Medication Sig Dispense Refill  . Ascorbic Acid (VITAMIN C) 1000 MG tablet Take 1,000 mg by mouth daily.    Marland Kitchen aspirin EC 81 MG tablet Take 162 mg by mouth daily.    Marland Kitchen azithromycin (ZITHROMAX) 250 MG tablet Take 2 tablets by mouth on day 1, followed by 1 tablet by mouth daily for 4 days. 6 tablet 0  . B Complex-C (SUPER B COMPLEX PO) Take 1 tablet by mouth daily.    . benzonatate (TESSALON) 100 MG capsule Take 1 capsule (100 mg total) by mouth 3 (three) times daily as needed for cough. 30 capsule 0  . clonazePAM (KLONOPIN) 1 MG tablet Take 0.5 mg by mouth 2 (two) times daily.     . DULoxetine (CYMBALTA) 60  MG capsule Take 60 mg by mouth 3 (three) times daily.     . fluticasone (FLONASE) 50 MCG/ACT nasal spray Place 2 sprays into both nostrils daily. 16 g 1  . gabapentin (NEURONTIN) 800 MG tablet Take 1 tablet (800 mg total) by mouth 3 (three) times daily. 90 tablet 2  . LATUDA 20 MG TABS tablet  Take 20 mg by mouth at bedtime.  0  . Multiple Vitamin (MULTIVITAMIN WITH MINERALS) TABS tablet Take 1 tablet by mouth daily.    . valsartan-hydrochlorothiazide (DIOVAN-HCT) 320-25 MG per tablet Take 1 tablet by mouth every morning.     . [DISCONTINUED] prochlorperazine (COMPAZINE) 10 MG tablet Take 1 tablet (10 mg total) by mouth every 6 (six) hours as needed (Nausea or vomiting). 30 tablet 1  . [DISCONTINUED] prochlorperazine (COMPAZINE) 25 MG suppository Place 1 suppository (25 mg total) rectally every 12 (twelve) hours as needed for nausea. 12 suppository 3   No current facility-administered medications for this visit.    PHYSICAL EXAMINATION: ECOG PERFORMANCE STATUS: 1 - Symptomatic but completely ambulatory  Filed Vitals:   11/16/15 1130  BP: 112/73  Pulse: 82  Temp: 98.9 F (37.2 C)  Resp: 18   Filed Weights   11/16/15 1130  Weight: 173 lb 8 oz (78.699 kg)    GENERAL:alert, no distress and comfortable SKIN: skin color, texture, turgor are normal, no rashes or significant lesions EYES: normal, Conjunctiva are pink and non-injected, sclera clear OROPHARYNX:no exudate, no erythema and lips, buccal mucosa, and tongue normal  NECK: supple, thyroid normal size, non-tender, without nodularity LYMPH:  no palpable lymphadenopathy in the cervical, axillary or inguinal LUNGS: clear to auscultation and percussion with normal breathing effort HEART: regular rate & rhythm and no murmurs and no lower extremity edema ABDOMEN:abdomen soft, non-tender and normal bowel sounds MUSCULOSKELETAL:no cyanosis of digits and no clubbing  NEURO: alert & oriented x 3 with fluent speech, no focal motor/sensory deficits EXTREMITIES: No lower extremity edema   LABORATORY DATA:  I have reviewed the data as listed   Chemistry      Component Value Date/Time   NA 141 09/01/2014 1525   NA 141 05/14/2014 1822   K 3.6 09/01/2014 1525   K 3.6* 05/14/2014 1822   CL 101 05/14/2014 1822   CL 100  04/23/2013 1447   CO2 28 09/01/2014 1525   CO2 30 05/14/2014 1822   BUN 13.3 09/01/2014 1525   BUN 12 05/14/2014 1822   CREATININE 0.8 09/01/2014 1525   CREATININE 0.78 05/14/2014 1822      Component Value Date/Time   CALCIUM 9.4 09/01/2014 1525   CALCIUM 9.7 05/14/2014 1822   ALKPHOS 62 09/01/2014 1525   ALKPHOS 69 05/14/2014 1822   AST 19 09/01/2014 1525   AST 22 05/14/2014 1822   ALT 17 09/01/2014 1525   ALT 16 05/14/2014 1822   BILITOT 0.22 09/01/2014 1525   BILITOT 0.2* 05/14/2014 1822       Lab Results  Component Value Date   WBC 4.5 09/01/2014   HGB 11.1* 09/01/2014   HCT 35.0 09/01/2014   MCV 90.4 09/01/2014   PLT 132* 09/01/2014   NEUTROABS 2.0 09/01/2014   ASSESSMENT & PLAN:  Primary cancer of upper 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 followed by Taxol  carboplatin later switched to [place] Gemzar completed 08/21/2013, status post adjuvant radiation, currently on tamoxifen started 11/13/2013  Left breast lymphedema: received physical therapy Neuropathy related to prior chemotherapy: under neurology care. Her symptoms are not getting any better with time.  Major depression: Patient is seeing psychiatry. She stopped tamoxifen for 3 months. Her depression symptoms have markedly improved. I recommended resuming tamoxifen therapy. If her depression gets worse. We can switch her to anastrozole.  Breast Cancer Surveillance: 1. Breast exam 09/21/2015: Normal 2. Mammogram 01/27/2015 No abnormalities. Postsurgical changes. Breast Density Category B. I recommended that she get 3-D mammograms for surveillance. She is due for mammograms in March of this year Discussed the differences between different breast density categories.  Return to  clinic in 6 months for follow-up No orders of the defined types were placed in this encounter.   The patient has a good understanding of the overall plan. she agrees with it. she will call with any problems that may develop before the next visit here.   Rulon Eisenmenger, MD 11/16/2015     Yesterday morning he

## 2015-11-16 NOTE — Addendum Note (Signed)
Addended by: Prentiss Bells on: 11/16/2015 03:32 PM   Modules accepted: Medications

## 2015-11-16 NOTE — Assessment & Plan Note (Signed)
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 followed by Taxol carboplatin later switched to carbo Gemzar completed 08/21/2013, status post adjuvant radiation, currently on tamoxifen started 11/13/2013  Left breast lymphedema: received physical therapy Neuropathy related to prior chemotherapy: under neurology care. Her symptoms are not getting any better with time.  Major depression: Patient is seeing psychiatry. I recommended discontinuing tamoxifen until January 2017. This will hopefully help us assess of tamoxifen is making her depression worse. If her symptoms improved by stopping tamoxifen, we can prescribe anastrozole.  Breast Cancer Surveillance: 1. Breast exam 09/21/2015: Normal 2. Mammogram 01/27/2015 No abnormalities. Postsurgical changes. Breast Density Category B. I recommended that she get 3-D mammograms for surveillance. Discussed the differences between different breast density categories.   

## 2015-11-16 NOTE — Telephone Encounter (Signed)
Appointments made and avs printed for patient °

## 2015-11-24 DIAGNOSIS — F411 Generalized anxiety disorder: Secondary | ICD-10-CM | POA: Diagnosis not present

## 2015-11-30 ENCOUNTER — Telehealth: Payer: Self-pay | Admitting: Family Medicine

## 2015-11-30 DIAGNOSIS — C50212 Malignant neoplasm of upper-inner quadrant of left female breast: Secondary | ICD-10-CM | POA: Diagnosis not present

## 2015-11-30 DIAGNOSIS — I890 Lymphedema, not elsewhere classified: Secondary | ICD-10-CM | POA: Diagnosis not present

## 2015-11-30 MED ORDER — VALSARTAN-HYDROCHLOROTHIAZIDE 320-25 MG PO TABS: 1.0000 | ORAL_TABLET | Freq: Every morning | ORAL | Status: DC

## 2015-11-30 NOTE — Telephone Encounter (Signed)
[id number] - [place], [place] - [address]  Pt needing refill on valsartan-hydrochlorothiazide (DIOVAN-HCT) 320-25 MG per tablet. She has 1 week left from previous provider (not with [facility]). She had to be rescheduled from 1/27 to 2/20.

## 2015-11-30 NOTE — Telephone Encounter (Signed)
Refill done.  

## 2015-12-03 ENCOUNTER — Ambulatory Visit: Payer: BC Managed Care – PPO | Admitting: Family Medicine

## 2015-12-08 ENCOUNTER — Other Ambulatory Visit: Payer: Self-pay | Admitting: Hematology and Oncology

## 2015-12-08 NOTE — Telephone Encounter (Signed)
Chart reviewed.

## 2015-12-22 DIAGNOSIS — F329 Major depressive disorder, single episode, unspecified: Secondary | ICD-10-CM | POA: Diagnosis not present

## 2015-12-27 ENCOUNTER — Ambulatory Visit (INDEPENDENT_AMBULATORY_CARE_PROVIDER_SITE_OTHER): Payer: BC Managed Care – PPO | Admitting: Family Medicine

## 2015-12-27 VITALS — BP 102/64 | HR 91 | Temp 98.3°F | Ht 62.0 in | Wt 173.8 lb

## 2015-12-27 DIAGNOSIS — I10 Essential (primary) hypertension: Secondary | ICD-10-CM

## 2015-12-27 DIAGNOSIS — F329 Major depressive disorder, single episode, unspecified: Secondary | ICD-10-CM

## 2015-12-27 DIAGNOSIS — R000 Tachycardia, unspecified: Secondary | ICD-10-CM | POA: Diagnosis not present

## 2015-12-27 DIAGNOSIS — F32A Depression, unspecified: Secondary | ICD-10-CM

## 2015-12-27 DIAGNOSIS — F418 Other specified anxiety disorders: Secondary | ICD-10-CM

## 2015-12-27 DIAGNOSIS — R739 Hyperglycemia, unspecified: Secondary | ICD-10-CM

## 2015-12-27 DIAGNOSIS — T451X5A Adverse effect of antineoplastic and immunosuppressive drugs, initial encounter: Secondary | ICD-10-CM

## 2015-12-27 DIAGNOSIS — C50919 Malignant neoplasm of unspecified site of unspecified female breast: Secondary | ICD-10-CM | POA: Diagnosis not present

## 2015-12-27 DIAGNOSIS — F419 Anxiety disorder, unspecified: Secondary | ICD-10-CM

## 2015-12-27 DIAGNOSIS — G620 Drug-induced polyneuropathy: Secondary | ICD-10-CM

## 2015-12-27 NOTE — Patient Instructions (Signed)
Encouraged increased hydration and fiber in diet. Daily probiotics. If bowels not moving can use MOM 2 tbls po in 4 oz of warm prune juice by mouth every 2-3 days. If no results then repeat in 4 hours with  Dulcolax suppository pr, may repeat again in 4 more hours as needed. Seek care if symptoms worsen. Consider daily Miralax and/or Dulcolax if symptoms persist.   Constipation, Adult Constipation is when a person has fewer than three bowel movements a week, has difficulty having a bowel movement, or has stools that are dry, hard, or larger than normal. As people grow older, constipation is more common. A low-fiber diet, not taking in enough fluids, and taking certain medicines may make constipation worse.  CAUSES   Certain medicines, such as antidepressants, pain medicine, iron supplements, antacids, and water pills.   Certain diseases, such as diabetes, irritable bowel syndrome (IBS), thyroid disease, or depression.   Not drinking enough water.   Not eating enough fiber-rich foods.   Stress or travel.   Lack of physical activity or exercise.   Ignoring the urge to have a bowel movement.   Using laxatives too much.  SIGNS AND SYMPTOMS   Having fewer than three bowel movements a week.   Straining to have a bowel movement.   Having stools that are hard, dry, or larger than normal.   Feeling full or bloated.   Pain in the lower abdomen.   Not feeling relief after having a bowel movement.  DIAGNOSIS  Your health care provider will take a medical history and perform a physical exam. Further testing may be done for severe constipation. Some tests may include:  A barium enema X-ray to examine your rectum, colon, and, sometimes, your small intestine.   A sigmoidoscopy to examine your lower colon.   A colonoscopy to examine your entire colon. TREATMENT  Treatment will depend on the severity of your constipation and what is causing it. Some dietary treatments include  drinking more fluids and eating more fiber-rich foods. Lifestyle treatments may include regular exercise. If these diet and lifestyle recommendations do not help, your health care provider may recommend taking over-the-counter laxative medicines to help you have bowel movements. Prescription medicines may be prescribed if over-the-counter medicines do not work.  HOME CARE INSTRUCTIONS   Eat foods that have a lot of fiber, such as fruits, vegetables, whole grains, and beans.  Limit foods high in fat and processed sugars, such as french fries, hamburgers, cookies, candies, and soda.   A fiber supplement may be added to your diet if you cannot get enough fiber from foods.   Drink enough fluids to keep your urine clear or pale yellow.   Exercise regularly or as directed by your health care provider.   Go to the restroom when you have the urge to go. Do not hold it.   Only take over-the-counter or prescription medicines as directed by your health care provider. Do not take other medicines for constipation without talking to your health care provider first.  [place] IF:   You have bright red blood in your stool.   Your constipation lasts for more than 4 days or gets worse.   You have abdominal or rectal pain.   You have thin, pencil-like stools.   You have unexplained weight loss. MAKE SURE YOU:   Understand these instructions.  Will watch your condition.  Will get help right away if you are not doing well or get worse.  This information is not intended to replace advice given to you by your health care provider. Make sure you discuss any questions you have with your health care provider.   Document Released: 07/21/2004 Document Revised: 11/13/2014 Document Reviewed: 08/04/2013 Elsevier Interactive Patient Education [employer].

## 2015-12-27 NOTE — Progress Notes (Signed)
Pre visit review using our clinic review tool, if applicable. No additional management support is needed unless otherwise documented below in the visit note. 

## 2015-12-28 ENCOUNTER — Other Ambulatory Visit: Payer: Self-pay | Admitting: Family Medicine

## 2015-12-28 DIAGNOSIS — E876 Hypokalemia: Secondary | ICD-10-CM

## 2015-12-28 LAB — TSH: TSH: 2.17 u[IU]/mL (ref 0.35–4.50)

## 2015-12-28 LAB — COMPREHENSIVE METABOLIC PANEL
ALBUMIN: 4.1 g/dL (ref 3.5–5.2)
ALT: 18 U/L (ref 0–35)
AST: 21 U/L (ref 0–37)
Alkaline Phosphatase: 40 U/L (ref 39–117)
BUN: 11 mg/dL (ref 6–23)
CALCIUM: 9.7 mg/dL (ref 8.4–10.5)
CHLORIDE: 103 meq/L (ref 96–112)
CO2: 31 meq/L (ref 19–32)
Creatinine, Ser: 0.86 mg/dL (ref 0.40–1.20)
GFR: 86.3 mL/min (ref >60.00)
Glucose, Bld: 108 mg/dL — ABNORMAL HIGH (ref 70–99)
POTASSIUM: 3.2 meq/L — AB (ref 3.5–5.1)
Sodium: 140 mEq/L (ref 135–145)
Total Bilirubin: 0.3 mg/dL (ref 0.2–1.2)
Total Protein: 6.9 g/dL (ref 6.0–8.3)

## 2015-12-28 LAB — CBC
HCT: 34.6 % — ABNORMAL LOW (ref 36.0–46.0)
Hemoglobin: 11.3 g/dL — ABNORMAL LOW (ref 12.0–15.0)
MCHC: 32.7 g/dL (ref 30.0–36.0)
MCV: 89 fl (ref 78.0–100.0)
PLATELETS: 160 10*3/uL (ref 150.0–400.0)
RBC: 3.88 Mil/uL (ref 3.87–5.11)
RDW: 14.6 % (ref 11.5–15.5)
WBC: 5.3 10*3/uL (ref 4.0–10.5)

## 2015-12-28 LAB — LIPID PANEL
CHOL/HDL RATIO: 3
CHOLESTEROL: 165 mg/dL (ref 0–200)
HDL: 56.1 mg/dL (ref >39.00)
LDL CALC: 84 mg/dL (ref 0–99)
NonHDL: 109.2
TRIGLYCERIDES: 126 mg/dL (ref 0.0–149.0)
VLDL: 25.2 mg/dL (ref 0.0–40.0)

## 2015-12-28 MED ORDER — POTASSIUM CHLORIDE CRYS ER 20 MEQ PO TBCR: EXTENDED_RELEASE_TABLET | ORAL | Status: DC

## 2015-12-31 ENCOUNTER — Encounter: Payer: Self-pay | Admitting: Family Medicine

## 2016-01-04 ENCOUNTER — Other Ambulatory Visit (INDEPENDENT_AMBULATORY_CARE_PROVIDER_SITE_OTHER): Payer: BC Managed Care – PPO

## 2016-01-04 DIAGNOSIS — E876 Hypokalemia: Secondary | ICD-10-CM

## 2016-01-04 LAB — COMPREHENSIVE METABOLIC PANEL
ALBUMIN: 3.8 g/dL (ref 3.5–5.2)
ALK PHOS: 45 U/L (ref 39–117)
ALT: 15 U/L (ref 0–35)
AST: 19 U/L (ref 0–37)
BUN: 11 mg/dL (ref 6–23)
CALCIUM: 9.4 mg/dL (ref 8.4–10.5)
CO2: 32 mEq/L (ref 19–32)
Chloride: 104 mEq/L (ref 96–112)
Creatinine, Ser: 0.85 mg/dL (ref 0.40–1.20)
GFR: 87.46 mL/min (ref >60.00)
Glucose, Bld: 95 mg/dL (ref 70–99)
POTASSIUM: 3.9 meq/L (ref 3.5–5.1)
Sodium: 142 mEq/L (ref 135–145)
TOTAL PROTEIN: 6.8 g/dL (ref 6.0–8.3)
Total Bilirubin: 0.5 mg/dL (ref 0.2–1.2)

## 2016-01-09 ENCOUNTER — Encounter: Payer: Self-pay | Admitting: Family Medicine

## 2016-01-09 DIAGNOSIS — R739 Hyperglycemia, unspecified: Secondary | ICD-10-CM

## 2016-01-09 HISTORY — DX: Hyperglycemia, unspecified: R73.9

## 2016-01-09 NOTE — Assessment & Plan Note (Signed)
Mild, minimize simple carbs and increase exercise

## 2016-01-09 NOTE — Progress Notes (Signed)
Patient ID: Kristi [name], female   DOB: 08/03/1956, 60 y.o.   MRN: [id number]   Subjective:    Patient ID: Kristi [name], female    DOB: 1956/03/02, 60 y.o.   MRN: [id number]  Chief Complaint  Patient presents with  . Follow-up    HPI Patient is in today for follow up. Doing well. No recent illness or hospitalization. No acute concerns. Continues to follow with psychiatry for her meds. Continues to struggle with neuropathy but it is tolerable with meds. Denies CP/palp/SOB/HA/congestion/fevers/GI or GU c/o. Taking meds as prescribed  Past Medical History  Diagnosis Date  . Anxiety   . Hypertension   . MRSA (methicillin resistant Staphylococcus aureus) 2009    right groin area-no issues now. 04-07-14 PCR screen negative today.  . Depression   . Anemia     Iron deficinecy anemia  . History of radiation therapy 09/09/13-10/28/13    45 gray to left breast, lumpectomy cavity boosted to 63 gray  . Neuropathy ([place])   . Arthritis   . History of chicken pox   . Breast cancer ([place])     left ,last radiation 2'15, last chemo 8'14  . Anxiety and depression 05/15/2014  . Hyperglycemia 01/09/2016    Past Surgical History  Procedure Laterality Date  . Anal sphincterotomy  04/2011  . Hemorrhoid surgery  04/2011    ligation  . Breast lumpectomy with needle localization Left 02/04/2013    Procedure: LEFT BREAST LUMPECTOMY WITH NEEDLE LOCALIZATION;  Surgeon: Stark Klein, MD;  Location: [place];  Service: General;  Laterality: Left;  . Axillary lymph node dissection Left 02/04/2013    Procedure: LEFT AXILLARY LYMPH NODE DISSECTION;  Surgeon: Stark Klein, MD;  Location: [place];  Service: General;  Laterality: Left;  End: [id number]  . Portacath placement Right 02/04/2013    Procedure: INSERTION PORT-A-CATH;  Surgeon: Stark Klein, MD;  Location: [place];  Service: General;  Laterality: Right;  Start [id number].  Marland Kitchen Appendectomy  1980  . Breast surgery      Lumpectomy in april 2014  . Shoulder arthroscopy with  rotator cuff repair and subacromial decompression Left 02/24/2014    Procedure: SHOULDER ARTHROSCOPY WITH ROTATOR CUFF REPAIR AND SUBACROMIAL DECOMPRESSION;  Surgeon: Meredith Pel, MD;  Location: [place];  Service: Orthopedics;  Laterality: Left;  LEFT SHOULDER DIAGNOSTIC OPERATIVE ARTHROSCOPY, SUBACROMIAL DECOMPRESSION, ROTATOR CUFF TEAR REPAIR  . Port-a-cath removal N/A 04/16/2014    Procedure: REMOVAL PORT-A-CATH;  Surgeon: Stark Klein, MD;  [facility];  Service: General;  Laterality: N/A;    Family History  Problem Relation Age of Onset  . Lung cancer Father   . Hypertension Father   . Thyroid cancer Father     dx in his 50s  . Cancer Father     lung, thyroid, smoker  . Breast cancer Paternal Aunt 86  . Colon cancer Paternal Aunt     dx in her 50x  . Cervical cancer Paternal Aunt     dzx in her 24s  . Ovarian cancer Cousin     dx in her lage 30s  . Breast cancer Cousin     maternal first cousin, once removed; dx in her late 75s  . Breast cancer Cousin     maternal first cousin once removed; dx in late 35s  . Hypertension Mother   . Diabetes Mother   . Dementia Mother   . Hypertension Brother   . Seizures Brother     Alcohol induced.  Marland Kitchen  Alcohol abuse Brother     drinker, smoker  . Cancer Paternal Uncle     oral cancer  . Kidney cancer Paternal Grandmother   . Cancer Cousin     several paternal cousins with brain cancer, leukemia, and other cancers  . Arthritis Daughter     back surgery    Social History   Social History  . Marital Status: Married    Spouse Name: [name]  . Number of Children: 1  . Years of Education: 12   Occupational History  . [employer]   Social History Main Topics  . Smoking status: Never Smoker   . Smokeless tobacco: Never Used  . Alcohol Use: No  . Drug Use: No  . Sexual Activity: Yes     Comment: lives with husband, disability/retirement. [employer], no dietary restrictions   Other  Topics Concern  . Not on file   Social History Narrative   Patient is married [name]) and lives at home with her husband.   Patient has one daughter.   Patient is working at [employer]   Patient has a 12th grade education.   Patient drinks very little caffeine.    Outpatient Prescriptions Prior to Visit  Medication Sig Dispense Refill  . Ascorbic Acid (VITAMIN C) 1000 MG tablet Take 1,000 mg by mouth daily.    Marland Kitchen aspirin EC 81 MG tablet Take 162 mg by mouth daily.    . B Complex-C (SUPER B COMPLEX PO) Take 1 tablet by mouth daily.    . clonazePAM (KLONOPIN) 1 MG tablet Take 0.5 mg by mouth 2 (two) times daily.     . DULoxetine (CYMBALTA) 60 MG capsule Take 60 mg by mouth 3 (three) times daily.     Marland Kitchen gabapentin (NEURONTIN) 800 MG tablet Take 1 tablet (800 mg total) by mouth 3 (three) times daily. 90 tablet 2  . Multiple Vitamin (MULTIVITAMIN WITH MINERALS) TABS tablet Take 1 tablet by mouth daily.    . tamoxifen (NOLVADEX) 20 MG tablet Take 20 mg by mouth daily.    . valsartan-hydrochlorothiazide (DIOVAN-HCT) 320-25 MG tablet Take 1 tablet by mouth every morning. 30 tablet 1  . azithromycin (ZITHROMAX) 250 MG tablet Take 2 tablets by mouth on day 1, followed by 1 tablet by mouth daily for 4 days. 6 tablet 0  . benzonatate (TESSALON) 100 MG capsule Take 1 capsule (100 mg total) by mouth 3 (three) times daily as needed for cough. 30 capsule 0  . fluticasone (FLONASE) 50 MCG/ACT nasal spray Place 2 sprays into both nostrils daily. 16 g 1  . LATUDA 20 MG TABS tablet Take 20 mg by mouth at bedtime.  0  . ALPRAZolam (XANAX) 1 MG tablet Reported on 12/27/2015    . gabapentin (NEURONTIN) 800 MG tablet TAKE 1 TABLET BY MOUTH 3 TIMES A DAY (Patient not taking: Reported on 12/27/2015) 90 tablet 5  . loxapine (LOXITANE) 10 MG capsule      No facility-administered medications prior to visit.    No Known Allergies  Review of Systems  Constitutional: Negative for fever and malaise/fatigue.  HENT:  Negative for congestion.   Eyes: Negative for discharge.  Respiratory: Negative for shortness of breath.   Cardiovascular: Negative for chest pain, palpitations and leg swelling.  Gastrointestinal: Negative for nausea and abdominal pain.  Genitourinary: Negative for dysuria.  Musculoskeletal: Negative for falls.  Skin: Negative for rash.  Neurological: Positive for tingling and sensory change. Negative for loss  of consciousness and headaches.  Endo/Heme/Allergies: Negative for environmental allergies.  Psychiatric/Behavioral: Negative for depression. The patient is not nervous/anxious.        Objective:    Physical Exam  Constitutional: She is oriented to person, place, and time. She appears well-developed and well-nourished. No distress.  HENT:  Head: Normocephalic and atraumatic.  Nose: Nose normal.  Eyes: Right eye exhibits no discharge. Left eye exhibits no discharge.  Neck: Normal range of motion. Neck supple.  Cardiovascular: Normal rate and regular rhythm.   No murmur heard. Pulmonary/Chest: Effort normal and breath sounds normal.  Abdominal: Soft. Bowel sounds are normal. There is no tenderness.  Musculoskeletal: She exhibits no edema.  Neurological: She is alert and oriented to person, place, and time.  Skin: Skin is warm and dry.  Psychiatric: She has a normal mood and affect.  Nursing note and vitals reviewed.   BP 102/64 mmHg  Pulse 91  Temp(Src) 98.3 F (36.8 C) (Oral)  Ht 5\' 2"  (1.575 m)  Wt 173 lb 12.8 oz (78.835 kg)  BMI 31.78 kg/m2  SpO2 98%  LMP 01/20/2013 Wt Readings from Last 3 Encounters:  12/27/15 173 lb 12.8 oz (78.835 kg)  11/16/15 173 lb 8 oz (78.699 kg)  11/04/15 176 lb 6.4 oz (80.015 kg)     Lab Results  Component Value Date   WBC 5.3 12/27/2015   HGB 11.3* 12/27/2015   HCT 34.6* 12/27/2015   PLT 160.0 12/27/2015   GLUCOSE 95 01/04/2016   CHOL 165 12/27/2015   TRIG 126.0 12/27/2015   HDL 56.10 12/27/2015   LDLCALC 84 12/27/2015    ALT 15 01/04/2016   AST 19 01/04/2016   NA 142 01/04/2016   K 3.9 01/04/2016   CL 104 01/04/2016   CREATININE 0.85 01/04/2016   BUN 11 01/04/2016   CO2 32 01/04/2016   TSH 2.17 12/27/2015   INR 1.02 07/19/2013    Lab Results  Component Value Date   TSH 2.17 12/27/2015   Lab Results  Component Value Date   WBC 5.3 12/27/2015   HGB 11.3* 12/27/2015   HCT 34.6* 12/27/2015   MCV 89.0 12/27/2015   PLT 160.0 12/27/2015   Lab Results  Component Value Date   NA 142 01/04/2016   K 3.9 01/04/2016   CHLORIDE 106 09/01/2014   CO2 32 01/04/2016   GLUCOSE 95 01/04/2016   BUN 11 01/04/2016   CREATININE 0.85 01/04/2016   BILITOT 0.5 01/04/2016   ALKPHOS 45 01/04/2016   AST 19 01/04/2016   ALT 15 01/04/2016   PROT 6.8 01/04/2016   ALBUMIN 3.8 01/04/2016   CALCIUM 9.4 01/04/2016   ANIONGAP 8 09/01/2014   GFR 87.46 01/04/2016   Lab Results  Component Value Date   CHOL 165 12/27/2015   Lab Results  Component Value Date   HDL 56.10 12/27/2015   Lab Results  Component Value Date   LDLCALC 84 12/27/2015   Lab Results  Component Value Date   TRIG 126.0 12/27/2015   Lab Results  Component Value Date   CHOLHDL 3 12/27/2015   No results found for: HGBA1C     Assessment & Plan:   Problem List Items Addressed This Visit    Anxiety and depression    Follows with Jerry Caras for meds and counseling is doing well most days      Chemotherapy-induced peripheral neuropathy ([place])    Tolerable symptoms with Gabapentin and Cymbalta      Relevant Medications   LATUDA 40 MG  TABS tablet   Hyperglycemia    Mild, minimize simple carbs and increase exercise       Other Visit Diagnoses    Malignant neoplasm of female breast, unspecified laterality, unspecified site of breast ([place])    -  Primary    Relevant Orders    CBC (Completed)    TSH (Completed)    Lipid panel (Completed)    Comprehensive metabolic panel (Completed)    Tachycardia        Relevant Orders    CBC  (Completed)    TSH (Completed)    Lipid panel (Completed)    Comprehensive metabolic panel (Completed)    HTN (hypertension), benign        Relevant Orders    CBC (Completed)    TSH (Completed)    Lipid panel (Completed)    Comprehensive metabolic panel (Completed)       I have discontinued Ms. Cafiero's fluticasone, benzonatate, azithromycin, and loxapine. I am also having her maintain her B Complex-C (SUPER B COMPLEX PO), DULoxetine, aspirin EC, multivitamin with minerals, vitamin C, clonazePAM, gabapentin, ALPRAZolam, tamoxifen, valsartan-hydrochlorothiazide, and LATUDA.  Meds ordered this encounter  Medications  . LATUDA 40 MG TABS tablet    Sig: Take 40 mg by mouth at bedtime.    Refill:  1     [name], MD

## 2016-01-09 NOTE — Assessment & Plan Note (Signed)
Tolerable symptoms with Gabapentin and Cymbalta

## 2016-01-09 NOTE — Assessment & Plan Note (Signed)
Follows with Jerry Caras for meds and counseling is doing well most days

## 2016-01-12 DIAGNOSIS — F411 Generalized anxiety disorder: Secondary | ICD-10-CM | POA: Diagnosis not present

## 2016-02-01 LAB — HM MAMMOGRAPHY

## 2016-02-04 ENCOUNTER — Encounter: Payer: Self-pay | Admitting: Family Medicine

## 2016-02-07 ENCOUNTER — Emergency Department (HOSPITAL_BASED_OUTPATIENT_CLINIC_OR_DEPARTMENT_OTHER)
Admission: EM | Admit: 2016-02-07 | Discharge: 2016-02-08 | Disposition: A | Discharge status: Home / Self Care | Payer: BC Managed Care – PPO | Attending: Emergency Medicine | Admitting: Emergency Medicine

## 2016-02-07 ENCOUNTER — Encounter (HOSPITAL_BASED_OUTPATIENT_CLINIC_OR_DEPARTMENT_OTHER): Payer: Self-pay | Admitting: Emergency Medicine

## 2016-02-07 DIAGNOSIS — Z8619 Personal history of other infectious and parasitic diseases: Secondary | ICD-10-CM | POA: Insufficient documentation

## 2016-02-07 DIAGNOSIS — Z853 Personal history of malignant neoplasm of breast: Secondary | ICD-10-CM | POA: Diagnosis not present

## 2016-02-07 DIAGNOSIS — I890 Lymphedema, not elsewhere classified: Secondary | ICD-10-CM | POA: Diagnosis not present

## 2016-02-07 DIAGNOSIS — M1990 Unspecified osteoarthritis, unspecified site: Secondary | ICD-10-CM | POA: Insufficient documentation

## 2016-02-07 DIAGNOSIS — Z7982 Long term (current) use of aspirin: Secondary | ICD-10-CM | POA: Insufficient documentation

## 2016-02-07 DIAGNOSIS — Z862 Personal history of diseases of the blood and blood-forming organs and certain disorders involving the immune mechanism: Secondary | ICD-10-CM | POA: Insufficient documentation

## 2016-02-07 DIAGNOSIS — I10 Essential (primary) hypertension: Secondary | ICD-10-CM | POA: Diagnosis not present

## 2016-02-07 DIAGNOSIS — Z79899 Other long term (current) drug therapy: Secondary | ICD-10-CM | POA: Diagnosis not present

## 2016-02-07 DIAGNOSIS — F419 Anxiety disorder, unspecified: Secondary | ICD-10-CM | POA: Diagnosis not present

## 2016-02-07 DIAGNOSIS — Z8614 Personal history of Methicillin resistant Staphylococcus aureus infection: Secondary | ICD-10-CM | POA: Diagnosis not present

## 2016-02-07 DIAGNOSIS — G629 Polyneuropathy, unspecified: Secondary | ICD-10-CM | POA: Diagnosis not present

## 2016-02-07 DIAGNOSIS — F329 Major depressive disorder, single episode, unspecified: Secondary | ICD-10-CM | POA: Insufficient documentation

## 2016-02-07 DIAGNOSIS — N644 Mastodynia: Secondary | ICD-10-CM | POA: Diagnosis present

## 2016-02-07 MED ORDER — OXYCODONE-ACETAMINOPHEN 5-325 MG PO TABS
1.0000 | ORAL_TABLET | Freq: Once | ORAL | Status: AC
  Administered 2016-02-08: 1 via ORAL
  Filled 2016-02-07: qty 1

## 2016-02-07 NOTE — ED Provider Notes (Signed)
CSN: [id number]     Arrival date & time 02/07/16  2322 History  By signing my name below, I, Kristi [name], attest that this documentation has been prepared under the direction and in the presence of Kristi Hacker, MD. Electronically Signed: Doran [name], ED Scribe. 02/08/2016. 11:48 PM.   Chief Complaint  Patient presents with  . Breast Pain   The history is provided by the patient. No language interpreter was used.   HPI Comments: Kristi [name] is a 60 y.o. female who presents to the Emergency Department with PMHx of left breast cancer and breast lymphedema complaining of left breast pain that began tonight. Patient had a mammogram last week and since that time has had some increased pain. She uses a machine to massage her breast twice a week; however, tonight was the first time she used it since her mammogram. She had a sudden increase in pain. Pain is worse with movement. Current pain is 8 out of 10. Pt took a Tramadol for pain with mild relief. Pt denies any nipple discharge, fevers, or any other symptoms at this time.   Pt has had breast lymphedema for two years.  Past Medical History  Diagnosis Date  . Anxiety   . Hypertension   . MRSA (methicillin resistant Staphylococcus aureus) 2009    right groin area-no issues now. 04-07-14 PCR screen negative today.  . Depression   . Anemia     Iron deficinecy anemia  . History of radiation therapy 09/09/13-10/28/13    45 gray to left breast, lumpectomy cavity boosted to 63 gray  . Neuropathy ([place])   . Arthritis   . History of chicken pox   . Breast cancer ([place])     left ,last radiation 2'15, last chemo 8'14  . Anxiety and depression 05/15/2014  . Hyperglycemia 01/09/2016   Past Surgical History  Procedure Laterality Date  . Anal sphincterotomy  04/2011  . Hemorrhoid surgery  04/2011    ligation  . Breast lumpectomy with needle localization Left 02/04/2013    Procedure: LEFT BREAST LUMPECTOMY WITH NEEDLE LOCALIZATION;  Surgeon: Stark Klein, MD;  Location: [place];  Service: General;  Laterality: Left;  . Axillary lymph node dissection Left 02/04/2013    Procedure: LEFT AXILLARY LYMPH NODE DISSECTION;  Surgeon: Stark Klein, MD;  Location: [place] [facility];  Service: General;  Laterality: Left;  End: [id number]  . Portacath placement Right 02/04/2013    Procedure: INSERTION PORT-A-CATH;  Surgeon: Stark Klein, MD;  Location: [facility];  Service: General;  Laterality: Right;  Start [id number].  Marland Kitchen Appendectomy  1980  . Breast surgery      Lumpectomy in april 2014  . Shoulder arthroscopy with rotator cuff repair and subacromial decompression Left 02/24/2014    Procedure: SHOULDER ARTHROSCOPY WITH ROTATOR CUFF REPAIR AND SUBACROMIAL DECOMPRESSION;  Surgeon: Meredith Pel, MD;  Location: [facility];  Service: Orthopedics;  Laterality: Left;  LEFT SHOULDER DIAGNOSTIC OPERATIVE ARTHROSCOPY, SUBACROMIAL DECOMPRESSION, ROTATOR CUFF TEAR REPAIR  . Port-a-cath removal N/A 04/16/2014    Procedure: REMOVAL PORT-A-CATH;  Surgeon: Stark Klein, MD;  [facility];  Service: General;  Laterality: N/A;   Family History  Problem Relation Age of Onset  . Lung cancer Father   . Hypertension Father   . Thyroid cancer Father     dx in his 37s  . Cancer Father     lung, thyroid, smoker  . Breast cancer Paternal Aunt 32  . Colon cancer Paternal Aunt  dx in her 50x  . Cervical cancer Paternal Aunt     dzx in her 96s  . Ovarian cancer Cousin     dx in her lage 70s  . Breast cancer Cousin     maternal first cousin, once removed; dx in her late 11s  . Breast cancer Cousin     maternal first cousin once removed; dx in late 53s  . Hypertension Mother   . Diabetes Mother   . Dementia Mother   . Hypertension Brother   . Seizures Brother     Alcohol induced.  . Alcohol abuse Brother     drinker, smoker  . Cancer Paternal Uncle     oral cancer  . Kidney cancer Paternal Grandmother   . Cancer Cousin     several paternal cousins with brain cancer, leukemia,  and other cancers  . Arthritis Daughter     back surgery   Social History  Substance Use Topics  . Smoking status: Never Smoker   . Smokeless tobacco: Never Used  . Alcohol Use: No   OB History    No data available     Review of Systems  Constitutional: Negative for fever.  Cardiovascular:       Left breast pain  Skin: Negative for color change.  Neurological: Negative for weakness and numbness.  All other systems reviewed and are negative.  Allergies  Review of patient's allergies indicates no known allergies.  Home Medications   Prior to Admission medications   Medication Sig Start Date End Date Taking? Authorizing Provider  ALPRAZolam Duanne Moron) 1 MG tablet Reported on 12/27/2015 10/21/15   Historical Provider, MD  Ascorbic Acid (VITAMIN C) 1000 MG tablet Take 1,000 mg by mouth daily.    Historical Provider, MD  aspirin EC 81 MG tablet Take 162 mg by mouth daily.    Historical Provider, MD  B Complex-C (SUPER B COMPLEX PO) Take 1 tablet by mouth daily.    Historical Provider, MD  clonazePAM (KLONOPIN) 1 MG tablet Take 0.5 mg by mouth 2 (two) times daily.     Historical Provider, MD  DULoxetine (CYMBALTA) 60 MG capsule Take 60 mg by mouth 3 (three) times daily.     Historical Provider, MD  gabapentin (NEURONTIN) 800 MG tablet Take 1 tablet (800 mg total) by mouth 3 (three) times daily. 09/13/15   Nicholas Lose, MD  LATUDA 40 MG TABS tablet Take 40 mg by mouth at bedtime. 11/30/15   Historical Provider, MD  Multiple Vitamin (MULTIVITAMIN WITH MINERALS) TABS tablet Take 1 tablet by mouth daily.    Historical Provider, MD  naproxen (NAPROSYN) 500 MG tablet Take 1 tablet (500 mg total) by mouth 2 (two) times daily. 02/08/16   Kristi Hacker, MD  potassium chloride SA (K-DUR,KLOR-CON) 20 MEQ tablet Take 2 tablets for 3 days, then take once a day; 12/28/15   Mosie Lukes, MD  tamoxifen (NOLVADEX) 20 MG tablet Take 20 mg by mouth daily.    Historical Provider, MD   valsartan-hydrochlorothiazide (DIOVAN-HCT) 320-25 MG tablet Take 1 tablet by mouth every morning. 11/30/15   Mosie Lukes, MD   BP 138/71 mmHg  Pulse 67  Temp(Src) 97.9 F (36.6 C) (Oral)  Resp 18  Ht 5\' 2"  (1.575 m)  Wt 172 lb (78.019 kg)  BMI 31.45 kg/m2  SpO2 100%  LMP 01/20/2013 Physical Exam  Constitutional: She is oriented to person, place, and time. She appears well-developed and well-nourished. No distress.  HENT:  Head:  Normocephalic and atraumatic.  Cardiovascular: Normal rate, regular rhythm and normal heart sounds.   Pulmonary/Chest: Effort normal and breath sounds normal. No respiratory distress. She has no wheezes. Right breast exhibits no inverted nipple, no mass, no nipple discharge and no skin change. Left breast exhibits skin change and tenderness. Left breast exhibits no inverted nipple, no mass and no nipple discharge. Breasts are symmetrical.  Mild asymmetry noted with diffuse mild edema of the left breast, tenderness to palpation inferior portion of the breast and laterally into the left axilla, no obvious masses, no overlying skin changes  Musculoskeletal: She exhibits no edema.  2+ radial pulse on the left, no obvious edema of left upper extremity  Neurological: She is alert and oriented to person, place, and time.  Skin: Skin is warm and dry.  Psychiatric: She has a normal mood and affect.  Nursing note and vitals reviewed.   ED Course  Procedures  DIAGNOSTIC STUDIES: Oxygen Saturation is 100% on room air, normal by my interpretation.    COORDINATION OF CARE: 12:54 AM Discussed treatment plan with pt at bedside and pt agreed to plan.  Labs Review Labs Reviewed - No data to display  Imaging Review No results found. I have personally reviewed and evaluated these images and lab results as part of my medical decision-making.   EKG Interpretation None      MDM   Final diagnoses:  Lymphedema of breast    Patient presents with left breast  pain. Acutely worsening after massaging tonight. Reports that she's had worsening pain since her mammogram last week. She has mild swelling and lymphedema and noted with tenderness, no overlying skin changes to suggest infection. She usually uses her massage machine twice a week but this is the first time that she has done it since her mammogram last week. Suspect she may have some extra lymphedema causing pain. Patient was given Percocet and Toradol. Have encouraged the patient to continue massage and add naproxen to her pain regimen at home. Follow-up with her primary physician as soon as possible. If pain persists she may need repeat imaging or repeat evaluation. She was given return precautions including fever, overlying skin changes, or any worsening symptoms.  After history, exam, and medical workup I feel the patient has been appropriately medically screened and is safe for discharge home. Pertinent diagnoses were discussed with the patient. Patient was given return precautions.  I personally performed the services described in this documentation, which was scribed in my presence. The recorded information has been reviewed and is accurate.    Kristi Hacker, MD 02/08/16 [phone]

## 2016-02-07 NOTE — ED Notes (Signed)
Patient states that she has a history of breast ephidema and she pumps the fluid off her breast at night. The patient has been unable tonight r/t to the pain in her breast

## 2016-02-08 DIAGNOSIS — I890 Lymphedema, not elsewhere classified: Secondary | ICD-10-CM | POA: Diagnosis not present

## 2016-02-08 MED ORDER — NAPROXEN 500 MG PO TABS: 500.0000 mg | ORAL_TABLET | Freq: Two times a day (BID) | ORAL | Status: DC

## 2016-02-08 MED ORDER — KETOROLAC TROMETHAMINE 30 MG/ML IJ SOLN
30.0000 mg | Freq: Once | INTRAMUSCULAR | Status: AC
  Administered 2016-02-08: 30 mg via INTRAMUSCULAR
  Filled 2016-02-08: qty 1

## 2016-02-08 NOTE — Discharge Instructions (Signed)
You were seen today a for pain associated with your lymphedema. There is no evidence of infection at this time. Continue to massage as tolerated. Take naproxen twice daily for pain for the next 3-5 days. Follow-up with your doctor for recheck if pain persist.  You may need repeat imaging or recheck if not improved.  Lymphedema Lymphedema is swelling that is caused by the abnormal collection of lymph under the skin. Lymph is fluid from the tissues in your body that travels in the lymphatic system. This system is part of the immune system and includes lymph nodes and lymph vessels. The lymph vessels collect and carry the excess fluid, fats, proteins, and wastes from the tissues of the body to the bloodstream. This system also works to clean and remove bacteria and waste products from the body. Lymphedema occurs when the lymphatic system is blocked. When the lymph vessels or lymph nodes are blocked or damaged, lymph does not drain properly, causing an abnormal buildup of lymph. This leads to swelling in the arms or legs. Lymphedema cannot be cured by medicines, but various methods can be used to help reduce the swelling. CAUSES There are two types of lymphedema. Primary lymphedema is caused by the absence or abnormality of the lymph vessel at birth. Secondary lymphedema is more common. It occurs when the lymph vessel is damaged or blocked. Common causes of lymph vessel blockage include:  Skin infection, such as cellulitis.  Infection by parasites (filariasis).  Injury.  Cancer.  Radiation therapy.  Formation of scar tissue.  Surgery. SYMPTOMS Symptoms of this condition include:  Swelling of the arm or leg.  A heavy or tight feeling in the arm or leg.  Swelling of the feet, toes, or fingers. Shoes or rings may fit more tightly than before.  Redness of the skin over the affected area.  Limited movement of the affected limb.  Sensitivity to touch or discomfort in the affected  limb. DIAGNOSIS This condition may be diagnosed with:  A physical exam.  Medical history.  Imaging tests, such as:  Lymphoscintigraphy. In this test, a low dose of a radioactive substance is injected to trace the flow of lymph through the lymph vessels.  MRI.  CT scan.  Duplex ultrasound. This test uses sound waves to produce images of the vessels and the blood flow on a screen.  Lymphangiography. In this test, a contrast dye is injected into the lymph vessel to help show blockages. TREATMENT Treatment for this condition may depend on the cause. Treatment may include:  Exercise. Certain exercises can help fluid move out of the affected limb.  Massage. Gentle massage of the affected limb can help move the fluid out of the area.  Compression. Various methods may be used to apply pressure to the affected limb in order to reduce the swelling.  Wearing compression stockings or sleeves on the affected limb.  Bandaging the affected limb.  Using an external pump that is attached to a sleeve that alternates between applying pressure and releasing pressure.  Surgery. This is usually only done for severe cases. For example, surgery may be done if you have trouble moving the limb or if the swelling does not get better with other treatments. If an underlying condition is causing the lymphedema, treatment for that condition is needed. For example, antibiotic medicines may be used to treat an infection. HOME CARE INSTRUCTIONS Activities  Exercise regularly as directed by your health care provider.  Do not sit with your legs crossed.  When  possible, keep the affected limb raised (elevated) above the level of your heart.  Avoid carrying things with an arm that is affected by lymphedema.  Remember that the affected area is more likely to become injured or infected.  Take these steps to help prevent infection:  Keep the affected area clean and dry.  Protect your skin from cuts. For  example, you should use gloves while cooking or gardening. Do not walk barefoot. If you shave the affected area, use an Copy. General Instructions  Take medicines only as directed by your health care provider.  Eat a healthy diet that includes a lot of fruits and vegetables.  Do not wear tight clothes, shoes, or jewelry.  Do not use heating pads over the affected area.  Avoid having blood pressure checked on the affected limb.  Keep all follow-up visits as directed by your health care provider. This is important. SEEK MEDICAL CARE IF:  You continue to have swelling in your limb.  You have a fever.  You have a cut that does not heal.  You have redness or pain in the affected area.  You have new swelling in your limb that comes on suddenly.  You develop purplish spots or sores (lesions) on your limb. SEEK IMMEDIATE MEDICAL CARE IF:  You have a skin rash.  You have chills or sweats.  You have shortness of breath.   This information is not intended to replace advice given to you by your health care provider. Make sure you discuss any questions you have with your health care provider.   Document Released: 08/20/2007 Document Revised: 03/09/2015 Document Reviewed: 09/30/2014 Elsevier Interactive Patient Education [employer].

## 2016-02-09 DIAGNOSIS — F411 Generalized anxiety disorder: Secondary | ICD-10-CM | POA: Diagnosis not present

## 2016-02-11 ENCOUNTER — Ambulatory Visit (INDEPENDENT_AMBULATORY_CARE_PROVIDER_SITE_OTHER): Payer: Medicare Other | Admitting: Medical

## 2016-02-11 ENCOUNTER — Encounter: Payer: Self-pay | Admitting: Medical

## 2016-02-11 VITALS — BP 122/76 | HR 70 | Temp 98.1°F | Ht 62.0 in | Wt 175.0 lb

## 2016-02-11 DIAGNOSIS — N644 Mastodynia: Secondary | ICD-10-CM

## 2016-02-11 DIAGNOSIS — L089 Local infection of the skin and subcutaneous tissue, unspecified: Secondary | ICD-10-CM | POA: Diagnosis not present

## 2016-02-11 MED ORDER — CEPHALEXIN 500 MG PO CAPS: 500.0000 mg | ORAL_CAPSULE | Freq: Two times a day (BID) | ORAL | Status: DC

## 2016-02-11 NOTE — Progress Notes (Signed)
Pre visit review using our clinic review tool, if applicable. No additional management support is needed unless otherwise documented below in the visit note. 

## 2016-02-11 NOTE — Patient Instructions (Addendum)
For your breast pain continue with naprosyn and massages.   You may have mild skin infection present based on exam today. Also fact that our are 2 wks out from mammogram and the skin feels mild indurated. So I will rx keflex. And advise follow up in 10 days.   For your anxiety, neuropathy, and insominia, I would ask that you get instuction from psychiatrist who is making your recent med changes.  Advise use probiotics while on antibiotics(or eat yogurt)

## 2016-02-11 NOTE — Progress Notes (Signed)
Subjective:    Patient ID: Kristi [name], female    DOB: Nov 02, 1956, 60 y.o.   MRN: [id number]  HPI   Pt in for evaluation. Pt had mammogram last Tuesday. Pt has some hx of lymphadema(this is side where she had cancer). Pt states read on day of mammogram showed no cancer. Pt had some faint tenderness.  Pt has machine to massage breast. She used this 6 days after mammogram. Her breast was very tender on day she used the machine. So he went to ED. Pt was given injectin of toradol and  Percocet tab. Then discharge with naprsoyn. Pt was told to manually massage breast which she did. Then last night used flexi touch machine and did not cause excacerbation of her prior pain.  Pt states that she called the company and they area sending out new torso garment. Currently pt is only on naprosyn.  Pt has history of depression. Pt is on latuda now and she is tapering off of cymbalta. Pt is seeing psychiatrist. Pt is also reducing the klonopin. But she called psyhyciatist recenlty and they went back upt 0.5mg  dose. Pt also has appointment with psychiatrist on Monday.  Pt also has neuropathy in her feet. She is on gabapentin. She states she lost her pain MD recently.    Pt takes lunesta this week for insomnia. Rx by her psychiatrist.   Did review mammogram report today. No evidence of malignancy.     Review of Systems  Constitutional: Negative for fever, chills and fatigue.  Respiratory: Negative for cough, chest tightness, shortness of breath and wheezing.   Cardiovascular: Negative for chest pain and palpitations.  Gastrointestinal: Negative for abdominal pain.  Musculoskeletal:       Lt breast area pain.  Neurological: Negative for dizziness, speech difficulty, weakness and headaches.       Neuropathy.  Hematological: Negative for adenopathy. Does not bruise/bleed easily.  Psychiatric/Behavioral: Negative for behavioral problems and confusion.    Past Medical History  Diagnosis Date  .  Anxiety   . Hypertension   . MRSA (methicillin resistant Staphylococcus aureus) 2009    right groin area-no issues now. 04-07-14 PCR screen negative today.  . Depression   . Anemia     Iron deficinecy anemia  . History of radiation therapy 09/09/13-10/28/13    45 gray to left breast, lumpectomy cavity boosted to 63 gray  . Neuropathy ([place])   . Arthritis   . History of chicken pox   . Breast cancer ([place])     left ,last radiation 2'15, last chemo 8'14  . Anxiety and depression 05/15/2014  . Hyperglycemia 01/09/2016    Social History   Social History  . Marital Status: Married    Spouse Name: Kristi [name]  . Number of Children: 1  . Years of Education: 12   Occupational History  . [employer]   Social History [name] Topics  . Smoking status: Never Smoker   . Smokeless tobacco: Never Used  . Alcohol Use: No  . Drug Use: No  . Sexual Activity: Yes     Comment: lives with husband, disability/retirement. [employer], no dietary restrictions   Other Topics Concern  . Not on file   Social History Narrative   Patient is married Kristi [name]) and lives at home with her husband.   Patient has one daughter.   Patient is working at [employer]   Patient has a 12th grade education.   Patient drinks very little  caffeine.    Past Surgical History  Procedure Laterality Date  . Anal sphincterotomy  04/2011  . Hemorrhoid surgery  04/2011    ligation  . Breast lumpectomy with needle localization Left 02/04/2013    Procedure: LEFT BREAST LUMPECTOMY WITH NEEDLE LOCALIZATION;  Surgeon: Stark Klein, MD;  Location: [facility];  Service: General;  Laterality: Left;  . Axillary lymph node dissection Left 02/04/2013    Procedure: LEFT AXILLARY LYMPH NODE DISSECTION;  Surgeon: Stark Klein, MD;  Location: [facility];  Service: General;  Laterality: Left;  End: [id number]  . Portacath placement Right 02/04/2013    Procedure: INSERTION PORT-A-CATH;  Surgeon: Stark Klein, MD;  Location: [facility];   Service: General;  Laterality: Right;  Start [id number].  Marland Kitchen Appendectomy  1980  . Breast surgery      Lumpectomy in april 2014  . Shoulder arthroscopy with rotator cuff repair and subacromial decompression Left 02/24/2014    Procedure: SHOULDER ARTHROSCOPY WITH ROTATOR CUFF REPAIR AND SUBACROMIAL DECOMPRESSION;  Surgeon: Meredith Pel, MD;  Location: [facility];  Service: Orthopedics;  Laterality: Left;  LEFT SHOULDER DIAGNOSTIC OPERATIVE ARTHROSCOPY, SUBACROMIAL DECOMPRESSION, ROTATOR CUFF TEAR REPAIR  . Port-a-cath removal N/A 04/16/2014    Procedure: REMOVAL PORT-A-CATH;  Surgeon: Stark Klein, MD;  Location: [facility];  Service: General;  Laterality: N/A;    Family History  Problem Relation Age of Onset  . Lung cancer Father   . Hypertension Father   . Thyroid cancer Father     dx in his 64s  . Cancer Father     lung, thyroid, smoker  . Breast cancer Paternal Aunt 4  . Colon cancer Paternal Aunt     dx in her 50x  . Cervical cancer Paternal Aunt     dzx in her 13s  . Ovarian cancer Cousin     dx in her lage 35s  . Breast cancer Cousin     maternal first cousin, once removed; dx in her late 8s  . Breast cancer Cousin     maternal first cousin once removed; dx in late 29s  . Hypertension Mother   . Diabetes Mother   . Dementia Mother   . Hypertension Brother   . Seizures Brother     Alcohol induced.  . Alcohol abuse Brother     drinker, smoker  . Cancer Paternal Uncle     oral cancer  . Kidney cancer Paternal Grandmother   . Cancer Cousin     several paternal cousins with brain cancer, leukemia, and other cancers  . Arthritis Daughter     back surgery    No Known Allergies  Current Outpatient Prescriptions on File Prior to Visit  Medication Sig Dispense Refill  . ALPRAZolam (XANAX) 1 MG tablet Reported on 12/27/2015    . Ascorbic Acid (VITAMIN C) 1000 MG tablet Take 1,000 mg by mouth daily.    Marland Kitchen aspirin EC 81 MG tablet Take 162 mg by mouth daily.    . B Complex-C  (SUPER B COMPLEX PO) Take 1 tablet by mouth daily.    . clonazePAM (KLONOPIN) 1 MG tablet Take 0.5 mg by mouth 2 (two) times daily.     Marland Kitchen gabapentin (NEURONTIN) 800 MG tablet Take 1 tablet (800 mg total) by mouth 3 (three) times daily. 90 tablet 2  . Multiple Vitamin (MULTIVITAMIN WITH MINERALS) TABS tablet Take 1 tablet by mouth daily.    . naproxen (NAPROSYN) 500 MG tablet Take 1 tablet (500 mg total) by mouth  2 (two) times daily. 10 tablet 0  . potassium chloride SA (K-DUR,KLOR-CON) 20 MEQ tablet Take 2 tablets for 3 days, then take once a day; 35 tablet 1  . tamoxifen (NOLVADEX) 20 MG tablet Take 20 mg by mouth daily.    . valsartan-hydrochlorothiazide (DIOVAN-HCT) 320-25 MG tablet Take 1 tablet by mouth every morning. 30 tablet 1  . [DISCONTINUED] prochlorperazine (COMPAZINE) 10 MG tablet Take 1 tablet (10 mg total) by mouth every 6 (six) hours as needed (Nausea or vomiting). 30 tablet 1  . [DISCONTINUED] prochlorperazine (COMPAZINE) 25 MG suppository Place 1 suppository (25 mg total) rectally every 12 (twelve) hours as needed for nausea. 12 suppository 3   No current facility-administered medications on file prior to visit.    BP 122/76 mmHg  Pulse 70  Temp(Src) 98.1 F (36.7 C) (Oral)  Ht 5\' 2"  (1.575 m)  Wt 175 lb (79.379 kg)  BMI 32.00 kg/m2  SpO2 98%  LMP 01/20/2013        Objective:   Physical Exam  General Mental Status- Alert. General Appearance- Not in acute distress.   Skin General: Color- Normal Color. Moisture- Normal Moisture.  Neck Carotid Arteries- Normal color. Moisture- Normal Moisture. No carotid bruits. No JVD.  Chest and Lung Exam Auscultation: Breath Sounds:-Normal.  Cardiovascular Auscultation:Rythm- Regular. Murmurs & Other Heart Sounds:Auscultation of the heart reveals- No Murmurs.  Abdomen Inspection:-Inspeection Normal. Palpation/Percussion:Note:No mass. Palpation and Percussion of the abdomen reveal- Non Tender, Non Distended + BS, no  rebound or guarding.   Nerologic Cranial Nerve exam:- CN III-XII intact(No nystagmus), symmetric smile. Drift Test:- No drift. Romberg Exam:- Negative.  Heal to Toe Gait exam:-Normal. Finger to Nose:- Normal/Intact Strength:- 5/5 equal and symmetric strength both upper and lower extremities.   Breast exam- breast exhibits no inverted nipple, no mass, no nipple discharge and no skin change. Left br Left breast exhibits no inverted nipple, no mass and no nipple discharge.   Breast are symmetric. Left lower area feels faint indurated and faint tender. But no rednesss or warmth.(pt states this about 50% less tender than before.  Musculoskeletal: She exhibits no edema.        Assessment & Plan:  For your breast pain continue with naprosyn and massages.   You may have mild skin infection present based on exam today. Also fact that our are 2 wks out from mammogram and the skin feels mild indurated. So I will rx keflex. And advise follow up in 10 days.   For your anxiety, neuropathy, and insominia, I would ask that you get instuction from psychiatrist who is making your recent med changes.

## 2016-02-12 ENCOUNTER — Encounter (HOSPITAL_BASED_OUTPATIENT_CLINIC_OR_DEPARTMENT_OTHER): Payer: Self-pay

## 2016-02-12 ENCOUNTER — Emergency Department (HOSPITAL_BASED_OUTPATIENT_CLINIC_OR_DEPARTMENT_OTHER)
Admission: EM | Admit: 2016-02-12 | Discharge: 2016-02-13 | Disposition: A | Discharge status: Home / Self Care | Payer: BC Managed Care – PPO | Attending: Emergency Medicine | Admitting: Emergency Medicine

## 2016-02-12 ENCOUNTER — Other Ambulatory Visit: Payer: Self-pay

## 2016-02-12 DIAGNOSIS — N644 Mastodynia: Secondary | ICD-10-CM | POA: Diagnosis not present

## 2016-02-12 DIAGNOSIS — I10 Essential (primary) hypertension: Secondary | ICD-10-CM | POA: Insufficient documentation

## 2016-02-12 DIAGNOSIS — M79602 Pain in left arm: Secondary | ICD-10-CM | POA: Insufficient documentation

## 2016-02-12 DIAGNOSIS — R42 Dizziness and giddiness: Secondary | ICD-10-CM | POA: Insufficient documentation

## 2016-02-12 DIAGNOSIS — M7989 Other specified soft tissue disorders: Secondary | ICD-10-CM | POA: Diagnosis not present

## 2016-02-12 NOTE — ED Notes (Addendum)
Pt reports left arm pain, swelling, known lymphedema - uses Flexitouch machine to manage - tonight she developed increased left arm pain, swelling, also has associated pain in left breast. Pt also reports feeling dizzy. Husband states they called [facility] today and they advised assessment for pt whom is followed by them. PT denies SI/HI.

## 2016-02-13 NOTE — ED Notes (Addendum)
Pt informed registration clerk that she was leaving ED due to wait time.

## 2016-02-14 ENCOUNTER — Other Ambulatory Visit: Payer: Self-pay | Admitting: Family Medicine

## 2016-02-14 ENCOUNTER — Telehealth: Payer: Self-pay | Admitting: Family Medicine

## 2016-02-14 DIAGNOSIS — G894 Chronic pain syndrome: Secondary | ICD-10-CM

## 2016-02-14 NOTE — Telephone Encounter (Signed)
referal to pain management placed.

## 2016-02-14 NOTE — Telephone Encounter (Signed)
Let pt know I put in referral to pain management at her request.

## 2016-02-14 NOTE — Telephone Encounter (Signed)
Please advise. hm

## 2016-02-14 NOTE — Telephone Encounter (Signed)
Relation to [id number] Call back number:[phone]  Reason for call:  Patient last seen 02/11/16 by Percell Miller requesting a referral to pain manaement due to her peripheral neuropathy. Please advise

## 2016-02-17 ENCOUNTER — Emergency Department (HOSPITAL_BASED_OUTPATIENT_CLINIC_OR_DEPARTMENT_OTHER)
Admission: EM | Admit: 2016-02-17 | Discharge: 2016-02-17 | Disposition: A | Discharge status: Home / Self Care | Payer: BC Managed Care – PPO | Attending: Emergency Medicine | Admitting: Emergency Medicine

## 2016-02-17 ENCOUNTER — Encounter (HOSPITAL_BASED_OUTPATIENT_CLINIC_OR_DEPARTMENT_OTHER): Payer: Self-pay

## 2016-02-17 DIAGNOSIS — Z79899 Other long term (current) drug therapy: Secondary | ICD-10-CM | POA: Insufficient documentation

## 2016-02-17 DIAGNOSIS — F32A Depression, unspecified: Secondary | ICD-10-CM

## 2016-02-17 DIAGNOSIS — F419 Anxiety disorder, unspecified: Secondary | ICD-10-CM

## 2016-02-17 DIAGNOSIS — I10 Essential (primary) hypertension: Secondary | ICD-10-CM | POA: Diagnosis not present

## 2016-02-17 DIAGNOSIS — F418 Other specified anxiety disorders: Secondary | ICD-10-CM | POA: Insufficient documentation

## 2016-02-17 DIAGNOSIS — F329 Major depressive disorder, single episode, unspecified: Secondary | ICD-10-CM

## 2016-02-17 NOTE — ED Notes (Signed)
Pt c/o nervousness, stress and anxiety. Has been onmeds for awhile. Last few weeks-increased symptoms, bouts of crying. Doesn't want to be alone. "Jitters" on inside. Therapist adjusted meds. Was here on Sat, but did not stay for eval.

## 2016-02-17 NOTE — ED Provider Notes (Signed)
CSN: [id number]     Arrival date & time 02/17/16  1831 History   First MD Initiated Contact with Patient 02/17/16 2023     Chief Complaint  Patient presents with  . Anxiety     (Consider location/radiation/quality/duration/timing/severity/associated sxs/prior Treatment) HPI Comments: Patient with a history of Anxiety and Depression presents today with increasing anxiety and depression over the past week.  She states that she has been recently seen by Psychiatrist and had her Psychiatric medications changed.  She was weaned off her Cymbalta and taken off of the [place] and started on Elavil, Xanax, and Klonipin three days ago.  She reports increased anxiety and difficulty sleeping.  She denies HI or SI.  She denies any physical complaints at this time.    The history is provided by the patient.    Past Medical History  Diagnosis Date  . Anxiety   . Hypertension   . MRSA (methicillin resistant Staphylococcus aureus) 2009    right groin area-no issues now. 04-07-14 PCR screen negative today.  . Depression   . Anemia     Iron deficinecy anemia  . History of radiation therapy 09/09/13-10/28/13    45 gray to left breast, lumpectomy cavity boosted to 63 gray  . Neuropathy ([facility])   . Arthritis   . History of chicken pox   . Breast cancer ([place])     left ,last radiation 2'15, last chemo 8'14  . Anxiety and depression 05/15/2014  . Hyperglycemia 01/09/2016   Past Surgical History  Procedure Laterality Date  . Anal sphincterotomy  04/2011  . Hemorrhoid surgery  04/2011    ligation  . Breast lumpectomy with needle localization Left 02/04/2013    Procedure: LEFT BREAST LUMPECTOMY WITH NEEDLE LOCALIZATION;  Surgeon: Stark Klein, MD;  Location: [facility];  Service: General;  Laterality: Left;  . Axillary lymph node dissection Left 02/04/2013    Procedure: LEFT AXILLARY LYMPH NODE DISSECTION;  Surgeon: Stark Klein, MD;  Location: [facility];  Service: General;  Laterality: Left;  End: [id number]  . Portacath  placement Right 02/04/2013    Procedure: INSERTION PORT-A-CATH;  Surgeon: Stark Klein, MD;  Location: [facility];  Service: General;  Laterality: Right;  Start [id number].  Marland Kitchen Appendectomy  1980  . Breast surgery      Lumpectomy in april 2014  . Shoulder arthroscopy with rotator cuff repair and subacromial decompression Left 02/24/2014    Procedure: SHOULDER ARTHROSCOPY WITH ROTATOR CUFF REPAIR AND SUBACROMIAL DECOMPRESSION;  Surgeon: Meredith Pel, MD;  Location: [facility];  Service: Orthopedics;  Laterality: Left;  LEFT SHOULDER DIAGNOSTIC OPERATIVE ARTHROSCOPY, SUBACROMIAL DECOMPRESSION, ROTATOR CUFF TEAR REPAIR  . Port-a-cath removal N/A 04/16/2014    Procedure: REMOVAL PORT-A-CATH;  Surgeon: Stark Klein, MD;  [facility];  Service: General;  Laterality: N/A;   Family History  Problem Relation Age of Onset  . Lung cancer Father   . Hypertension Father   . Thyroid cancer Father     dx in his 15s  . Cancer Father     lung, thyroid, smoker  . Breast cancer Paternal Aunt 76  . Colon cancer Paternal Aunt     dx in her 50x  . Cervical cancer Paternal Aunt     dzx in her 22s  . Ovarian cancer Cousin     dx in her lage 89s  . Breast cancer Cousin     maternal first cousin, once removed; dx in her late 64s  . Breast cancer Cousin  maternal first cousin once removed; dx in late 27s  . Hypertension Mother   . Diabetes Mother   . Dementia Mother   . Hypertension Brother   . Seizures Brother     Alcohol induced.  . Alcohol abuse Brother     drinker, smoker  . Cancer Paternal Uncle     oral cancer  . Kidney cancer Paternal Grandmother   . Cancer Cousin     several paternal cousins with brain cancer, leukemia, and other cancers  . Arthritis Daughter     back surgery   Social History  Substance Use Topics  . Smoking status: Never Smoker   . Smokeless tobacco: Never Used  . Alcohol Use: No   OB History    No data available     Review of Systems  All other systems reviewed  and are negative.     Allergies  Review of patient's allergies indicates no known allergies.  Home Medications   Prior to Admission medications   Medication Sig Start Date End Date Taking? Authorizing Provider  amitriptyline (ELAVIL) 25 MG tablet Take 25 mg by mouth at bedtime.   Yes Historical Provider, MD  ALPRAZolam Duanne Moron) 1 MG tablet Reported on 12/27/2015 10/21/15   Historical Provider, MD  Ascorbic Acid (VITAMIN C) 1000 MG tablet Take 1,000 mg by mouth daily.    Historical Provider, MD  aspirin EC 81 MG tablet Take 162 mg by mouth daily.    Historical Provider, MD  B Complex-C (SUPER B COMPLEX PO) Take 1 tablet by mouth daily.    Historical Provider, MD  cephALEXin (KEFLEX) 500 MG capsule Take 1 capsule (500 mg total) by mouth 2 (two) times daily. 02/11/16   Percell Miller Saguier, PA-C  clonazePAM (KLONOPIN) 1 MG tablet Take 0.5 mg by mouth 2 (two) times daily.     Historical Provider, MD  Eszopiclone (ESZOPICLONE) 3 MG TABS Take 3 mg by mouth at bedtime. Take immediately before bedtime    Historical Provider, MD  gabapentin (NEURONTIN) 800 MG tablet Take 1 tablet (800 mg total) by mouth 3 (three) times daily. 09/13/15   Nicholas Lose, MD  Multiple Vitamin (MULTIVITAMIN WITH MINERALS) TABS tablet Take 1 tablet by mouth daily.    Historical Provider, MD  naproxen (NAPROSYN) 500 MG tablet Take 1 tablet (500 mg total) by mouth 2 (two) times daily. 02/08/16   Merryl Hacker, MD  potassium chloride SA (K-DUR,KLOR-CON) 20 MEQ tablet Take 2 tablets for 3 days, then take once a day; 12/28/15   Mosie Lukes, MD  tamoxifen (NOLVADEX) 20 MG tablet Take 20 mg by mouth daily.    Historical Provider, MD  valsartan-hydrochlorothiazide (DIOVAN-HCT) 320-25 MG tablet TAKE 1 TABLET BY MOUTH EVERY MORNING. 02/14/16   Mackie Pai, PA-C   BP 112/78 mmHg  Pulse 77  Temp(Src) 98.3 F (36.8 C) (Oral)  Resp 18  Ht 5\' 2"  (1.575 m)  Wt 77.565 kg  BMI 31.27 kg/m2  SpO2 98%  LMP 01/20/2013 Physical Exam   Constitutional: She appears well-developed and well-nourished.  HENT:  Head: Normocephalic and atraumatic.  Neck: Normal range of motion. Neck supple.  Cardiovascular: Normal rate, regular rhythm and normal heart sounds.   Pulmonary/Chest: Effort normal and breath sounds normal.  Musculoskeletal: Normal range of motion.  Neurological: She is alert.  Skin: Skin is warm and dry.  Psychiatric: Her mood appears anxious. Thought content is not paranoid. She exhibits a depressed mood. She expresses no homicidal and no suicidal ideation. She expresses  no suicidal plans and no homicidal plans.  Nursing note and vitals reviewed.   ED Course  Procedures (including critical care time) Labs Review Labs Reviewed - No data to display  Imaging Review No results found. I have personally reviewed and evaluated these images and lab results as part of my medical decision-making.   EKG Interpretation None      MDM   Final diagnoses:  None   Patient presents today with increasing anxiety and depression.  She has established care with a Psychiatrist who recently changed her Psychiatric medications.  No HI or SI.  Feel that the patient is stable for discharge.  Instructed to follow up with Psychiatrist.  Return precautions given.    Hyman Bible, PA-C 02/18/16 [place], DO 02/21/16 [phone]

## 2016-02-17 NOTE — Discharge Instructions (Signed)
Return to the Emergency Department immediately if you develop thoughts of hurting self or others. °

## 2016-02-17 NOTE — ED Notes (Signed)
C/o anxiety '"feeling jittery"-NAD-steady gait-psych changed meds from latuda and cymbalta and started on elavil over the last week-c/o x 1 month-daughter with pt-NAD-steady gait-denies SI/HI

## 2016-02-24 ENCOUNTER — Other Ambulatory Visit: Payer: Self-pay | Admitting: Family Medicine

## 2016-02-25 ENCOUNTER — Other Ambulatory Visit: Payer: Self-pay | Admitting: Hematology and Oncology

## 2016-02-25 NOTE — Telephone Encounter (Signed)
Chart reviewed.

## 2016-02-28 ENCOUNTER — Encounter: Payer: Self-pay | Admitting: Physical Therapy

## 2016-02-28 ENCOUNTER — Ambulatory Visit: Payer: BC Managed Care – PPO | Attending: General Surgery | Admitting: Physical Therapy

## 2016-02-28 DIAGNOSIS — I890 Lymphedema, not elsewhere classified: Secondary | ICD-10-CM | POA: Diagnosis not present

## 2016-02-28 NOTE — Therapy (Signed)
[place], [name], [address] Phone: [phone]   Fax:  [phone]  Physical Therapy Evaluation  Patient Details  Name: Kristi [name] MRN: [id number] Date of Birth: Sep 14, 1956 Referring Provider: Barry Dienes  Encounter Date: 02/28/2016      PT End of Session - 02/28/16 1708    Visit Number 1   Number of Visits 25   Date for PT Re-Evaluation 04/24/16   PT Start Time [id number]   PT Stop Time 1345   PT Time Calculation (min) 37 min   Activity Tolerance Patient tolerated treatment well   Behavior During Therapy [facility] for tasks assessed/performed      Past Medical History  Diagnosis Date  . Anxiety   . Hypertension   . MRSA (methicillin resistant Staphylococcus aureus) 2009    right groin area-no issues now. 04-07-14 PCR screen negative today.  . Depression   . Anemia     Iron deficinecy anemia  . History of radiation therapy 09/09/13-10/28/13    45 gray to left breast, lumpectomy cavity boosted to 63 gray  . Neuropathy ([place])   . Arthritis   . History of chicken pox   . Breast cancer ([place])     left ,last radiation 2'15, last chemo 8'14  . Anxiety and depression 05/15/2014  . Hyperglycemia 01/09/2016    Past Surgical History  Procedure Laterality Date  . Anal sphincterotomy  04/2011  . Hemorrhoid surgery  04/2011    ligation  . Breast lumpectomy with needle localization Left 02/04/2013    Procedure: LEFT BREAST LUMPECTOMY WITH NEEDLE LOCALIZATION;  Surgeon: Stark Klein, MD;  Location: [place];  Service: General;  Laterality: Left;  . Axillary lymph node dissection Left 02/04/2013    Procedure: LEFT AXILLARY LYMPH NODE DISSECTION;  Surgeon: Stark Klein, MD;  Location: [place];  Service: General;  Laterality: Left;  End: [id number]  . Portacath placement Right 02/04/2013    Procedure: INSERTION PORT-A-CATH;  Surgeon: Stark Klein, MD;  Location: [place];  Service: General;  Laterality: Right;  Start [id number].  Marland Kitchen  Appendectomy  1980  . Breast surgery      Lumpectomy in april 2014  . Shoulder arthroscopy with rotator cuff repair and subacromial decompression Left 02/24/2014    Procedure: SHOULDER ARTHROSCOPY WITH ROTATOR CUFF REPAIR AND SUBACROMIAL DECOMPRESSION;  Surgeon: Meredith Pel, MD;  Location: [facility];  Service: Orthopedics;  Laterality: Left;  LEFT SHOULDER DIAGNOSTIC OPERATIVE ARTHROSCOPY, SUBACROMIAL DECOMPRESSION, ROTATOR CUFF TEAR REPAIR  . Port-a-cath removal N/A 04/16/2014    Procedure: REMOVAL PORT-A-CATH;  Surgeon: Stark Klein, MD;  Location: [facility];  Service: General;  Laterality: N/A;    There were no vitals filed for this visit.       Subjective Assessment - 02/28/16 1313    Subjective Pt had to get new compression garments for her pump which are easier to get on. Pt states she was unable to do the manual theapy and relies on compression pump. Pt states she has not been able to wear her compression bra.    Pertinent History Left lumpectomy and axillary lymph node dissection 02/04/13 followed by chemotherapy and radiation to left breast and axilla. She has struggled significantly with bilateral feet neuropathy, anxiety and depression.   Patient Stated Goals to have my breasts feel some sort of normal   Currently in Pain? Yes   Pain Score 4    Pain Location Breast   Pain Orientation Left   Pain Descriptors /  Indicators Tightness   Pain Type Chronic pain   Pain Onset More than a month ago   Pain Frequency Constant   Aggravating Factors  driving, sweeping   Pain Relieving Factors FlexiTouch compression pump   Effect of Pain on Daily Activities pt states it affects her daily activites a lot, has difficulty reaching with LUE due to pain, folding cothes, hanging up clothes            [facility] PT Assessment - 02/28/16 0001    Assessment   Medical Diagnosis left breast cancer   Referring Provider byerly   Onset Date/Surgical Date 02/05/13   Hand Dominance Left   Prior Therapy  prior lymphedema therapy in 2015, and 2016 (eval only)   Precautions   Precautions Other (comment)  lymphedema   Restrictions   Weight Bearing Restrictions No   Balance Screen   Has the patient fallen in the past 6 months No   Has the patient had a decrease in activity level because of a fear of falling?  Yes   Is the patient reluctant to leave their home because of a fear of falling?  No   Home Environment   Living Environment Private residence   Living Arrangements Spouse/significant other   Available Help at Discharge Family   Type of [place] to enter   Entrance Stairs-Number of Steps 3   Entrance Stairs-Rails None   Home Layout Two level   Alternate Level Stairs-Number of Steps 14   Alternate Level Stairs-Rails Left   [place] - single point   Prior Function   Level of [place] Independent   Vocation On disability   Leisure not able due to neuropathy in feet   Cognition   Overall Cognitive Status Within Functional Limits for tasks assessed   Observation/Other Assessments   Other Surveys  --  LLIS: 61% impairment   AROM   Right Shoulder Flexion 155 Degrees   Right Shoulder ABduction 164 Degrees   Right Shoulder Internal Rotation 60 Degrees   Right Shoulder External Rotation 76 Degrees   Left Shoulder Flexion 140 Degrees   Left Shoulder ABduction 154 Degrees   Left Shoulder Internal Rotation 50 Degrees   Left Shoulder External Rotation 78 Degrees           LYMPHEDEMA/ONCOLOGY QUESTIONNAIRE - 02/28/16 1328    Type   Cancer Type Left breast   Surgeries   Lumpectomy Date 02/04/13   Axillary Lymph Node Dissection Date 02/04/13   Number Lymph Nodes Removed 19   Date Lymphedema/Swelling Started   Date 05/14/14   Treatment   Active Chemotherapy Treatment No   Past Chemotherapy Treatment Yes   Date 02/23/13   Active Radiation Treatment No   Past Radiation Treatment Yes   Date 10/28/13   Body Site left chest   Current  Hormone Treatment Yes   Drug Name Tamoxifen   What other symptoms do you have   Are you Having Heaviness or Tightness Yes   Are you having Pain Yes   Are you having pitting edema No   Is it Hard or Difficult finding clothes that fit Yes   Do you have infections Yes   Lymphedema Assessments   Lymphedema Assessments Upper extremities   Right Upper Extremity Lymphedema   15 cm Proximal to Olecranon Process 27 cm   Olecranon Process 23 cm   15 cm Proximal to Ulnar Styloid Process 22.5 cm   Just Proximal to Ulnar Styloid  Process 14.8 cm   Across Hand at [employer] 17.5 cm   At [place] of 2nd Digit 6 cm   Left Upper Extremity Lymphedema   15 cm Proximal to Olecranon Process 29.4 cm   Olecranon Process 24.9 cm   15 cm Proximal to Ulnar Styloid Process 24.7 cm   Just Proximal to Ulnar Styloid Process 14.6 cm   Across Hand at [employer] 17.8 cm   At [place] of 2nd Digit 6 cm                        PT Education - 02/28/16 1707    Education provided Yes   Education Details importance of wearing compression bra, physiology of the lymphatic system   Person(s) Educated Patient   Methods Explanation   Comprehension Verbalized understanding           Short Term Clinic Goals - 02/28/16 1714    CC Short Term Goal  #1   Title Pt to be able to verbalize lymphedema risk reduction practices   Time 4   Period Weeks   Status New   CC Short Term Goal  #2   Title Pt to report decreased pain of less than 3/10 to improve comfort and allow increased use of LUE   Baseline 4   Time 4   Period Weeks   Status New             Long Term Clinic Goals - 02/28/16 1715    CC Long Term Goal  #1   Title Pt to demonstrate 155 degrees of left shoulder flexion to allow pt to reach items overhead   Baseline 140   Time 8   Period Weeks   Status New   CC Long Term Goal  #2   Title Pt to have appropriate compression bra for long term management of edema   Time 8   Period  Weeks   Status New   CC Long Term Goal  #3   Title Pt will report a 50% decrease in breast lymphedema symptoms   Time 8   CC Long Term Goal  #4   Title Pt will report decreased pain of less than 2/10 to allow improved use of LUE   Baseline 4   Time 8   Period Weeks   Status New   CC Long Term Goal  #5   Title Pt will have an improved LLIS impairment score of less than 30% to allow improved use of LUE   Baseline 61% impairment   Time 8   Period Weeks   Status New            Plan - 02/28/16 1709    Clinical Impression Statement Pt with history of lymphedema since 2015. She has received lymphedema services at this facility in the past in 2015-2016 and was discharged. Pt has been using a compression pump at home but has not been wearing any type of compression bra stating it is too tight. Pt also has pain in left lateral breast and in posterior left upper arm. Her left arm measures approx 2 cm larger in circumference throughout when compared to her RUE. She is left handed. She may be beginning to develop some lymphedema in LUE and will need to be measured throughout her visits to ensure there is no increase in edema. She would benefit from skilled PT services to decrease left breast edema, find appropriate compression garment, and  increase left UE ROM.    Rehab Potential Good   Clinical Impairments Affecting Rehab Potential none   PT Frequency 3x / week   PT Duration 8 weeks   PT Treatment/Interventions Taping;Manual techniques;Manual lymph drainage;Therapeutic exercise;Passive range of motion;ADLs/Self Care Home Management   PT Next Visit Plan begin MLD to LUE, re measure LUE circumferences, ask pt to bring in compression bra to next appt, ROM to LUE   Consulted and Agree with Plan of Care Patient      Patient will benefit from skilled therapeutic intervention in order to improve the following deficits and impairments:  Pain, Impaired UE functional use, Decreased strength, Increased  edema  Visit Diagnosis: Lymphedema, not elsewhere classified - Plan: PT plan of care cert/re-cert     Problem List Patient Active Problem List   Diagnosis Date Noted  . Hyperglycemia 01/09/2016  . Chemotherapy-induced peripheral neuropathy ([place] [address]) 09/21/2015  . History of chicken pox   . Shingles 05/15/2014  . Anxiety and depression 05/15/2014  . Rotator cuff tear 02/24/2014  . Hot flashes due to tamoxifen 02/20/2014  . Left shoulder pain with abduction 12/12/2013  . CVA (cerebral infarction) 07/18/2013  . TIA (transient ischemic attack) 07/18/2013  . Contracture of axilla 05/20/2013  . Primary cancer of upper inner quadrant of left female breast ([place]) 01/24/2013  . Anal fissure 05/03/2011  . Hemorrhoids, internal, with prolapse 05/03/2011    Alexia Freestone 02/28/2016, 5:21 PM  [place], [name], [address] Phone: [phone]   Fax:  [phone]  Name: DARIAH CHIAVERINI MRN: [id number] Date of Birth: December 23, 1955   Allyson Sabal, PT 02/28/2016 5:21 PM

## 2016-03-01 ENCOUNTER — Encounter: Payer: Self-pay | Admitting: Physical Therapy

## 2016-03-01 ENCOUNTER — Ambulatory Visit: Payer: BC Managed Care – PPO | Admitting: Physical Therapy

## 2016-03-01 DIAGNOSIS — I890 Lymphedema, not elsewhere classified: Secondary | ICD-10-CM | POA: Diagnosis not present

## 2016-03-01 NOTE — Therapy (Signed)
[place], [name], [address] Phone: [phone]   Fax:  [phone]  Physical Therapy Treatment  Patient Details  Name: Kristi [name] MRN: [id number] Date of Birth: [birth year] Referring Provider: Barry Dienes  Encounter Date: 03/01/2016      PT End of Session - 03/01/16 0939    Visit Number 2   Number of Visits 25   Date for PT Re-Evaluation 04/24/16   PT Start Time 0848   PT Stop Time 0930   PT Time Calculation (min) 42 min      Past Medical History  Diagnosis Date  . Anxiety   . Hypertension   . MRSA (methicillin resistant Staphylococcus aureus) 2009    right groin area-no issues now. 04-07-14 PCR screen negative today.  . Depression   . Anemia     Iron deficinecy anemia  . History of radiation therapy 09/09/13-10/28/13    45 gray to left breast, lumpectomy cavity boosted to 63 gray  . Neuropathy ([place])   . Arthritis   . History of chicken pox   . Breast cancer ([place])     left ,last radiation 2'15, last chemo 8'14  . Anxiety and depression 05/15/2014  . Hyperglycemia 01/09/2016    Past Surgical History  Procedure Laterality Date  . Anal sphincterotomy  04/2011  . Hemorrhoid surgery  04/2011    ligation  . Breast lumpectomy with needle localization Left 02/04/2013    Procedure: LEFT BREAST LUMPECTOMY WITH NEEDLE LOCALIZATION;  Surgeon: Stark Klein, MD;  Location: [place] [facility];  Service: General;  Laterality: Left;  . Axillary lymph node dissection Left 02/04/2013    Procedure: LEFT AXILLARY LYMPH NODE DISSECTION;  Surgeon: Stark Klein, MD;  Location: [facility];  Service: General;  Laterality: Left;  End: [id number]  . Portacath placement Right 02/04/2013    Procedure: INSERTION PORT-A-CATH;  Surgeon: Stark Klein, MD;  Location: [facility];  Service: General;  Laterality: Right;  Start [id number].  Marland Kitchen Appendectomy  1980  . Breast surgery      Lumpectomy in april 2014  . Shoulder arthroscopy with rotator cuff repair and  subacromial decompression Left 02/24/2014    Procedure: SHOULDER ARTHROSCOPY WITH ROTATOR CUFF REPAIR AND SUBACROMIAL DECOMPRESSION;  Surgeon: Meredith Pel, MD;  Location: [facility];  Service: Orthopedics;  Laterality: Left;  LEFT SHOULDER DIAGNOSTIC OPERATIVE ARTHROSCOPY, SUBACROMIAL DECOMPRESSION, ROTATOR CUFF TEAR REPAIR  . Port-a-cath removal N/A 04/16/2014    Procedure: REMOVAL PORT-A-CATH;  Surgeon: Stark Klein, MD;  Location[facility];  Service: General;  Laterality: N/A;    There were no vitals filed for this visit.      Subjective Assessment - 03/01/16 0851    Subjective My arm is ok. Pt put on her compression bra and used the compression pump yesterday. Pt put on compression bra this morning.    Pertinent History Left lumpectomy and axillary lymph node dissection 02/04/13 followed by chemotherapy and radiation to left breast and axilla. She has struggled significantly with bilateral feet neuropathy, anxiety and depression.   Patient Stated Goals to have my breasts feel some sort of normal   Currently in Pain? Yes   Pain Score 1    Pain Location Axilla   Pain Orientation Left   Pain Descriptors / Indicators Discomfort   Pain Type Other (Comment)  where compression bra is rubbing   Pain Onset Today   Pain Frequency Constant   Aggravating Factors  when patient moves her arm it rubs against compression  bra and is irritating                         [facility] Adult PT Treatment/Exercise - 03/01/16 0001    Manual Therapy   Manual Therapy Manual Lymphatic Drainage (MLD);Compression Bandaging   Manual Lymphatic Drainage (MLD) short neck, superficial and deep abdominals, stimulation of left inguinal and right axillary nodes, established interaxillary and left axillo inguinal anastomoses, drainage of left breast and left upper quadrant moving fluid towards pathways, then in right sidelying worked on right axillo inguinal anastomosis and drainage of lateral breast, also  establishment of posterior inter axillary pathway, back to supine and finshed with inter axillary and axillo inguinal anastomosis   Compression Bandaging added TG soft to axilla of compression bra on left side because it irritates left axilla by lapping TG soft over edge and tucking edge under band of bra - pt reported increased comfort                   Short Term Clinic Goals - 02/28/16 1714    CC Short Term Goal  #1   Title Pt to be able to verbalize lymphedema risk reduction practices   Time 4   Period Weeks   Status New   CC Short Term Goal  #2   Title Pt to report decreased pain of less than 3/10 to improve comfort and allow increased use of LUE   Baseline 4   Time 4   Period Weeks   Status New             Long Term Clinic Goals - 02/28/16 1715    CC Long Term Goal  #1   Title Pt to demonstrate 155 degrees of left shoulder flexion to allow pt to reach items overhead   Baseline 140   Time 8   Period Weeks   Status New   CC Long Term Goal  #2   Title Pt to have appropriate compression bra for long term management of edema   Time 8   Period Weeks   Status New   CC Long Term Goal  #3   Title Pt will report a 50% decrease in breast lymphedema symptoms   Time 8   CC Long Term Goal  #4   Title Pt will report decreased pain of less than 2/10 to allow improved use of LUE   Baseline 4   Time 8   Period Weeks   Status New   CC Long Term Goal  #5   Title Pt will have an improved LLIS impairment score of less than 30% to allow improved use of LUE   Baseline 61% impairment   Time 8   Period Weeks   Status New            Plan - 03/01/16 0940    Clinical Impression Statement Pt reports discomfort in left axilla with use of compression bra. TG soft looped over top of bra and tucked under band to help pull down lateral part of bra and add padding for increased comfort. MLD performed to left breast and LUQ with pt reporting increased breast softening at end  of session. Fibrosis can be palpated in medial left breast.    Rehab Potential Good   Clinical Impairments Affecting Rehab Potential none   PT Frequency 3x / week   PT Duration 8 weeks   PT Treatment/Interventions Taping;Manual techniques;Manual lymph drainage;Therapeutic exercise;Passive range of motion;ADLs/Self Care Home  Management   PT Next Visit Plan MLD to LUE, re measure LUE circumferences, ROM to LUE   Consulted and Agree with Plan of Care Patient      Patient will benefit from skilled therapeutic intervention in order to improve the following deficits and impairments:  Pain, Impaired UE functional use, Decreased strength, Increased edema  Visit Diagnosis: Lymphedema, not elsewhere classified     Problem List Patient Active Problem List   Diagnosis Date Noted  . Hyperglycemia 01/09/2016  . Chemotherapy-induced peripheral neuropathy ([place]) 09/21/2015  . History of chicken pox   . Shingles 05/15/2014  . Anxiety and depression 05/15/2014  . Rotator cuff tear 02/24/2014  . Hot flashes due to tamoxifen 02/20/2014  . Left shoulder pain with abduction 12/12/2013  . CVA (cerebral infarction) 07/18/2013  . TIA (transient ischemic attack) 07/18/2013  . Contracture of axilla 05/20/2013  . Primary cancer of upper inner quadrant of left female breast ([place]) 01/24/2013  . Anal fissure 05/03/2011  . Hemorrhoids, internal, with prolapse 05/03/2011    Alexia Freestone 03/01/2016, 9:42 AM  [place], [name], [address] Phone: [phone]   Fax:  [phone]  Name: JEHILYN DELAHOYA MRN: [id number] Date of Birth: 05-08-1956    Allyson Sabal, PT 03/01/2016 9:42 AM

## 2016-03-02 ENCOUNTER — Ambulatory Visit: Payer: Medicare Other | Admitting: Medical

## 2016-03-06 ENCOUNTER — Ambulatory Visit: Payer: BC Managed Care – PPO | Admitting: Physical Therapy

## 2016-03-06 ENCOUNTER — Ambulatory Visit: Payer: BC Managed Care – PPO | Attending: General Surgery

## 2016-03-06 DIAGNOSIS — M25612 Stiffness of left shoulder, not elsewhere classified: Secondary | ICD-10-CM | POA: Diagnosis not present

## 2016-03-06 DIAGNOSIS — M25512 Pain in left shoulder: Secondary | ICD-10-CM | POA: Diagnosis not present

## 2016-03-06 DIAGNOSIS — N644 Mastodynia: Secondary | ICD-10-CM | POA: Insufficient documentation

## 2016-03-06 DIAGNOSIS — F411 Generalized anxiety disorder: Secondary | ICD-10-CM | POA: Diagnosis not present

## 2016-03-06 DIAGNOSIS — I890 Lymphedema, not elsewhere classified: Secondary | ICD-10-CM | POA: Insufficient documentation

## 2016-03-06 NOTE — Therapy (Signed)
[place], [name], [address] Phone: [phone]   Fax:  [phone]  Physical Therapy Treatment  Patient Details  Name: Kristi [name] MRN: [id number] Date of Birth: 1956/07/25 Referring Provider: Barry Dienes  Encounter Date: 03/06/2016      PT End of Session - 03/06/16 1107    Visit Number 3   Number of Visits 25   Date for PT Re-Evaluation 04/24/16   PT Start Time 1022   PT Stop Time 1106   PT Time Calculation (min) 44 min   Activity Tolerance Patient tolerated treatment well   Behavior During Therapy [facility] for tasks assessed/performed      Past Medical History  Diagnosis Date  . Anxiety   . Hypertension   . MRSA (methicillin resistant Staphylococcus aureus) 2009    right groin area-no issues now. 04-07-14 PCR screen negative today.  . Depression   . Anemia     Iron deficinecy anemia  . History of radiation therapy 09/09/13-10/28/13    45 gray to left breast, lumpectomy cavity boosted to 63 gray  . Neuropathy ([place])   . Arthritis   . History of chicken pox   . Breast cancer ([place])     left ,last radiation 2'15, last chemo 8'14  . Anxiety and depression 05/15/2014  . Hyperglycemia 01/09/2016    Past Surgical History  Procedure Laterality Date  . Anal sphincterotomy  04/2011  . Hemorrhoid surgery  04/2011    ligation  . Breast lumpectomy with needle localization Left 02/04/2013    Procedure: LEFT BREAST LUMPECTOMY WITH NEEDLE LOCALIZATION;  Surgeon: Stark Klein, MD;  Location: [place];  Service: General;  Laterality: Left;  . Axillary lymph node dissection Left 02/04/2013    Procedure: LEFT AXILLARY LYMPH NODE DISSECTION;  Surgeon: Stark Klein, MD;  Location: [place];  Service: General;  Laterality: Left;  End: [id number]  . Portacath placement Right 02/04/2013    Procedure: INSERTION PORT-A-CATH;  Surgeon: Stark Klein, MD;  Location: [place];  Service: General;  Laterality: Right;  Start [id number].  Marland Kitchen  Appendectomy  1980  . Breast surgery      Lumpectomy in april 2014  . Shoulder arthroscopy with rotator cuff repair and subacromial decompression Left 02/24/2014    Procedure: SHOULDER ARTHROSCOPY WITH ROTATOR CUFF REPAIR AND SUBACROMIAL DECOMPRESSION;  Surgeon: Meredith Pel, MD;  Location: [place];  Service: Orthopedics;  Laterality: Left;  LEFT SHOULDER DIAGNOSTIC OPERATIVE ARTHROSCOPY, SUBACROMIAL DECOMPRESSION, ROTATOR CUFF TEAR REPAIR  . Port-a-cath removal N/A 04/16/2014    Procedure: REMOVAL PORT-A-CATH;  Surgeon: Stark Klein, MD;  [facility];  Service: General;  Laterality: N/A;    There were no vitals filed for this visit.      Subjective Assessment - 03/06/16 1026    Subjective I feel like the swelling is about the same, the arm seems fine though. My breast isn't hurting, it just feels tight. I have been dealing with some depression that has kind of snuck up on me so I have an appt with behavorial health next Monday.   Pertinent History Left lumpectomy and axillary lymph node dissection 02/04/13 followed by chemotherapy and radiation to left breast and axilla. She has struggled significantly with bilateral feet neuropathy, anxiety and depression.   Patient Stated Goals to have my breasts feel some sort of normal   Currently in Pain? No/denies               LYMPHEDEMA/ONCOLOGY QUESTIONNAIRE - 03/06/16 1028  Left Upper Extremity Lymphedema   15 cm Proximal to Olecranon Process 30.5 cm   Olecranon Process 25.3 cm   15 cm Proximal to Ulnar Styloid Process 25.1 cm   Just Proximal to Ulnar Styloid Process 14.9 cm   Across Hand at [employer] 18 cm   At [place] of 2nd Digit 6.1 cm                  [facility] Adult PT Treatment/Exercise - 03/06/16 0001    Manual Therapy   Manual Lymphatic Drainage (MLD) short neck, superficial and deep abdominals, stimulation of left inguinal and right axillary nodes, established interaxillary and left axillo inguinal  anastomoses, drainage of left breast and left upper quadrant and UE from dorsal hand to lateral shoulder working down the arm initially in segments moving fluid towards pathways, then in right sidelying worked on right axillo inguinal anastomosis and drainage of lateral breast, also establishment of posterior inter axillary pathway, back to supine and finshed with inter axillary and axillo inguinal anastomosis                   Short Term Clinic Goals - 03/06/16 1111    CC Short Term Goal  #1   Title Pt to be able to verbalize lymphedema risk reduction practices   Status On-going   CC Short Term Goal  #2   Title Pt to report decreased pain of less than 3/10 to improve comfort and allow increased use of LUE   Status On-going             Long Term Clinic Goals - 02/28/16 1715    CC Long Term Goal  #1   Title Pt to demonstrate 155 degrees of left shoulder flexion to allow pt to reach items overhead   Baseline 140   Time 8   Period Weeks   Status New   CC Long Term Goal  #2   Title Pt to have appropriate compression bra for long term management of edema   Time 8   Period Weeks   Status New   CC Long Term Goal  #3   Title Pt will report a 50% decrease in breast lymphedema symptoms   Time 8   CC Long Term Goal  #4   Title Pt will report decreased pain of less than 2/10 to allow improved use of LUE   Baseline 4   Time 8   Period Weeks   Status New   CC Long Term Goal  #5   Title Pt will have an improved LLIS impairment score of less than 30% to allow improved use of LUE   Baseline 61% impairment   Time 8   Period Weeks   Status New            Plan - 03/06/16 1107    Clinical Impression Statement Pt tolerated treatment well but did become tearful during session while explaining that she has been struggling with depression and will be going to behavorial health next Monday for an appt. Reports the TG soft on bra jelped some but didn't stay i nplave well. Her  circumference measurements on her are were increased from last week so instructed pt to begin wearing her compression sleeve daily and she agreed.    Rehab Potential Good   Clinical Impairments Affecting Rehab Potential none   PT Frequency 3x / week   PT Duration 8 weeks   PT Treatment/Interventions Taping;Manual techniques;Manual lymph drainage;Therapeutic exercise;Passive  range of motion;ADLs/Self Care Home Management   PT Next Visit Plan MLD to LUE, re measure LUE circumferences, ROM to LUE. Assess use of compression sleeve.    Consulted and Agree with Plan of Care Patient      Patient will benefit from skilled therapeutic intervention in order to improve the following deficits and impairments:  Pain, Impaired UE functional use, Decreased strength, Increased edema  Visit Diagnosis: Lymphedema, not elsewhere classified  Breast pain     Problem List Patient Active Problem List   Diagnosis Date Noted  . Hyperglycemia 01/09/2016  . Chemotherapy-induced peripheral neuropathy ([place]) 09/21/2015  . History of chicken pox   . Shingles 05/15/2014  . Anxiety and depression 05/15/2014  . Rotator cuff tear 02/24/2014  . Hot flashes due to tamoxifen 02/20/2014  . Left shoulder pain with abduction 12/12/2013  . CVA (cerebral infarction) 07/18/2013  . TIA (transient ischemic attack) 07/18/2013  . Contracture of axilla 05/20/2013  . Primary cancer of upper inner quadrant of left female breast ([place]) 01/24/2013  . Anal fissure 05/03/2011  . Hemorrhoids, internal, with prolapse 05/03/2011    Otelia Limes, PTA 03/06/2016, 11:13 AM  [place], [name], [address] Phone: [phone]   Fax:  [phone]  Name: RANYLA DEYO MRN: [id number] Date of Birth: 01-Jul-1956

## 2016-03-08 ENCOUNTER — Ambulatory Visit: Payer: BC Managed Care – PPO

## 2016-03-08 DIAGNOSIS — M25612 Stiffness of left shoulder, not elsewhere classified: Secondary | ICD-10-CM | POA: Diagnosis not present

## 2016-03-08 DIAGNOSIS — M25512 Pain in left shoulder: Secondary | ICD-10-CM | POA: Diagnosis not present

## 2016-03-08 DIAGNOSIS — I890 Lymphedema, not elsewhere classified: Secondary | ICD-10-CM | POA: Diagnosis not present

## 2016-03-08 DIAGNOSIS — N644 Mastodynia: Secondary | ICD-10-CM | POA: Diagnosis not present

## 2016-03-08 NOTE — Patient Instructions (Signed)
Cancer Rehab [phone] Strengthening: Resisted Internal Rotation   Hold tubing in left hand, elbow at side and forearm out. Rotate forearm in across body. Repeat __10__ times per set. Do _1-2___ sets per session. Do _2-3___ sessions per day.  http://orth.exer.us/830   Copyright  VHI. All rights reserved.  Strengthening: Resisted External Rotation   Hold tubing in right hand, elbow at side and forearm across body. Rotate forearm out. Repeat __10__ times per set. Do _1-2___ sets per session. Do _2-3___ sessions per day.  http://orth.exer.us/828   Copyright  VHI. All rights reserved.  Strengthening: Resisted Flexion   Hold tubing with left arm at side. Pull forward and up. Move shoulder through pain-free range of motion. Repeat _10___ times per set. Do _1-2___ sets per session. Do __2-3__ sessions per day.  http://orth.exer.us/824   Copyright  VHI. All rights reserved.  Strengthening: Resisted Extension   Hold tubing in right hand, arm forward. Pull arm back, elbow straight. Repeat __10__ times per set. Do _1-2___ sets per session. Do _2-3___ sessions per day.  http://orth.exer.us/832   Copyright  VHI. All rights reserved.   Flexibility: Corner Stretch    Standing in corner with one foot in front of other and hands just above shoulder level and lean forward until a comfortable stretch is felt across chest. Hold _10-20___ seconds. Repeat _3-5___ times per set. Do _1___ sets per session. Do _3___ sessions per day.  http://orth.exer.us/343   Copyright  VHI. All rights reserved.

## 2016-03-08 NOTE — Therapy (Signed)
[place], [name], [address] Phone: [phone]   Fax:  [phone]  Physical Therapy Treatment  Patient Details  Name: Kristi [name] MRN: [id number] Date of Birth: 19-Oct-1956 Referring Provider: Barry Dienes  Encounter Date: 03/08/2016      PT End of Session - 03/08/16 1435    Visit Number 4   Number of Visits 25   Date for PT Re-Evaluation 04/24/16   PT Start Time [id number]   PT Stop Time 1348   PT Time Calculation (min) 53 min   Activity Tolerance Patient tolerated treatment well   Behavior During Therapy [facility] for tasks assessed/performed      Past Medical History  Diagnosis Date  . Anxiety   . Hypertension   . MRSA (methicillin resistant Staphylococcus aureus) 2009    right groin area-no issues now. 04-07-14 PCR screen negative today.  . Depression   . Anemia     Iron deficinecy anemia  . History of radiation therapy 09/09/13-10/28/13    45 gray to left breast, lumpectomy cavity boosted to 63 gray  . Neuropathy ([place])   . Arthritis   . History of chicken pox   . Breast cancer ([place])     left ,last radiation 2'15, last chemo 8'14  . Anxiety and depression 05/15/2014  . Hyperglycemia 01/09/2016    Past Surgical History  Procedure Laterality Date  . Anal sphincterotomy  04/2011  . Hemorrhoid surgery  04/2011    ligation  . Breast lumpectomy with needle localization Left 02/04/2013    Procedure: LEFT BREAST LUMPECTOMY WITH NEEDLE LOCALIZATION;  Surgeon: Stark Klein, MD;  Location: [place];  Service: General;  Laterality: Left;  . Axillary lymph node dissection Left 02/04/2013    Procedure: LEFT AXILLARY LYMPH NODE DISSECTION;  Surgeon: Stark Klein, MD;  Location: [place];  Service: General;  Laterality: Left;  End: [id number]  . Portacath placement Right 02/04/2013    Procedure: INSERTION PORT-A-CATH;  Surgeon: Stark Klein, MD;  Location: [place];  Service: General;  Laterality: Right;  Start [id number].  Marland Kitchen  Appendectomy  1980  . Breast surgery      Lumpectomy in april 2014  . Shoulder arthroscopy with rotator cuff repair and subacromial decompression Left 02/24/2014    Procedure: SHOULDER ARTHROSCOPY WITH ROTATOR CUFF REPAIR AND SUBACROMIAL DECOMPRESSION;  Surgeon: Meredith Pel, MD;  Location: [place];  Service: Orthopedics;  Laterality: Left;  LEFT SHOULDER DIAGNOSTIC OPERATIVE ARTHROSCOPY, SUBACROMIAL DECOMPRESSION, ROTATOR CUFF TEAR REPAIR  . Port-a-cath removal N/A 04/16/2014    Procedure: REMOVAL PORT-A-CATH;  Surgeon: Stark Klein, MD;  [facility];  Service: General;  Laterality: N/A;    There were no vitals filed for this visit.      Subjective Assessment - 03/08/16 1302    Subjective Not having any pain today, feel good. Using my compression pump about 3x/week. When my breast is sore I don't use it, but that's normally after I haven't used it for a few days.    Pertinent History Left lumpectomy and axillary lymph node dissection 02/04/13 followed by chemotherapy and radiation to left breast and axilla. She has struggled significantly with bilateral feet neuropathy, anxiety and depression.   Patient Stated Goals to have my breasts feel some sort of normal   Currently in Pain? No/denies                         [facility] Adult PT Treatment/Exercise - 03/08/16 0001  Shoulder Exercises: Standing   External Rotation Strengthening;Left;10 reps;Theraband   Theraband Level (Shoulder External Rotation) Level 1 (Yellow)   Internal Rotation Strengthening;Left;10 reps;Theraband   Theraband Level (Shoulder Internal Rotation) Level 1 (Yellow)   Flexion Strengthening;Left;10 reps;Theraband   Theraband Level (Shoulder Flexion) Level 1 (Yellow)   Extension Strengthening;Left;10 reps;Theraband   Theraband Level (Shoulder Extension) Level 1 (Yellow)   Retraction Strengthening;Both;10 reps;Theraband   Theraband Level (Shoulder Retraction) Level 1 (Yellow)   Shoulder Exercises:  Pulleys   Flexion 2 minutes   ABduction 2 minutes   ABduction Limitations Tatile cuing to decrease shoulder hike   Shoulder Exercises: Therapy Ball   Flexion 10 reps   Shoulder Exercises: ROM/Strengthening   Other ROM/Strengthening Exercises Finger Ladder for Lt UE abduction 10 reps with pt placing her Rt hand on her Lt shoulder for cuing to decrease shoulder hike   Other ROM/Strengthening Exercises Lt ER stretch in doorframe 3 reps of 30 second holds, and then corner pectoralis stretch 5 reps 10 second holds.   Manual Therapy   Manual Lymphatic Drainage (MLD) short neck, superficial and deep abdominals, stimulation of left inguinal and right axillary nodes, established interaxillary and left axillo inguinal anastomoses, drainage of left breast and left upper quadrant and UE from dorsal hand to lateral shoulder working down the arm initially in segments moving fluid towards pathways.                PT Education - 03/08/16 1330    Education provided Yes   Education Details Educated and advised pt on importance of her being compliant with her Maintenance Phase of treatment. And how [place] doing manual lymph drainage just 2x/week isnt as effective as her wearing both her copression sleeve and bra and utiliizing her compression pump daily, reminding her that lymphedema is a chronic condition that she has to continue treating. Also that we will focus more on Lt shoulder strength and ROM as this will also help improve her lymphedema having increased muscle flexibility. Also encouraged her to begin a walking routine and log her progress with this as this will help her feel better and help her CIPN symtpoms. And instructed in [place] for Lt shoulder.    Person(s) Educated Patient   Methods Explanation;Handout  [place]   Comprehension Verbalized understanding           Short Term Clinic Goals - 03/06/16 1111    CC Short Term Goal  #1   Title Pt to be able to verbalize lymphedema risk  reduction practices   Status On-going   CC Short Term Goal  #2   Title Pt to report decreased pain of less than 3/10 to improve comfort and allow increased use of LUE   Status On-going             Long Term Clinic Goals - 03/08/16 1655    CC Long Term Goal  #1   Title Pt to demonstrate 155 degrees of left shoulder flexion to allow pt to reach items overhead   Status On-going   CC Long Term Goal  #2   Title Pt to have appropriate compression bra for long term management of edema   Status On-going   CC Long Term Goal  #3   Title Pt will report a 50% decrease in breast lymphedema symptoms   Status On-going   CC Long Term Goal  #4   Title Pt will report decreased pain of less than 2/10 to allow improved use of LUE  Status On-going   CC Long Term Goal  #5   Title Pt will have an improved LLIS impairment score of less than 30% to allow improved use of LUE   Status On-going            Plan - 03/08/16 1644    Clinical Impression Statement Discussed with pt today how though we will still perform manual lymph drainage to her Lt breast and UE, it's very important that she begin to be more compliant with her Maintenance Phase of treatment at home. Like being compliant with her compression sleeve and bra wear, and using the compre3ssion pump daily. Instead we will mostly focus on increasing her AROM and focusing on getting her independent on a HEP with that as we have    Rehab Potential Good   Clinical Impairments Affecting Rehab Potential none   PT Frequency 3x / week   PT Duration 8 weeks   PT Treatment/Interventions Taping;Manual techniques;Manual lymph drainage;Therapeutic exercise;Passive range of motion;ADLs/Self Care Home Management   PT Next Visit Plan Focus on ROM to LUE and progressing HEP. Manual lymph drainage at end of session.  Assess compliance of Maintenance Phase of treatment.    Consulted and Agree with Plan of Care Patient      Patient will benefit from skilled  therapeutic intervention in order to improve the following deficits and impairments:  Pain, Impaired UE functional use, Decreased strength, Increased edema  Visit Diagnosis: Lymphedema, not elsewhere classified     Problem List Patient Active Problem List   Diagnosis Date Noted  . Hyperglycemia 01/09/2016  . Chemotherapy-induced peripheral neuropathy ([place]) 09/21/2015  . History of chicken pox   . Shingles 05/15/2014  . Anxiety and depression 05/15/2014  . Rotator cuff tear 02/24/2014  . Hot flashes due to tamoxifen 02/20/2014  . Left shoulder pain with abduction 12/12/2013  . CVA (cerebral infarction) 07/18/2013  . TIA (transient ischemic attack) 07/18/2013  . Contracture of axilla 05/20/2013  . Primary cancer of upper inner quadrant of left female breast ([place]) 01/24/2013  . Anal fissure 05/03/2011  . Hemorrhoids, internal, with prolapse 05/03/2011    Otelia Limes, PTA 03/08/2016, 4:56 PM  [place], [name], [address] Phone: [phone]   Fax:  [phone]  Name: Kristi [name] MRN: [id number] Date of Birth: 1956-06-06

## 2016-03-09 ENCOUNTER — Ambulatory Visit
Admission: RE | Admit: 2016-03-09 | Discharge: 2016-03-09 | Disposition: A | Discharge status: Home / Self Care | Payer: BC Managed Care – PPO | Source: Ambulatory Visit | Attending: Radiation Oncology | Admitting: Radiation Oncology

## 2016-03-09 ENCOUNTER — Encounter: Payer: Self-pay | Admitting: Radiation Oncology

## 2016-03-09 ENCOUNTER — Telehealth: Payer: Self-pay | Admitting: *Deleted

## 2016-03-09 VITALS — BP 103/53 | HR 75 | Temp 97.7°F | Resp 16 | Ht 62.0 in | Wt 171.1 lb

## 2016-03-09 DIAGNOSIS — C50212 Malignant neoplasm of upper-inner quadrant of left female breast: Secondary | ICD-10-CM

## 2016-03-09 NOTE — Progress Notes (Addendum)
Kristi [name] here for follow up.  She reports having discomfort in her left breast due to lymphedema.  She does have a sleeve and pump but is not consistent with using them.  She reports her energy level is fair.  She had her last mammogram in March.  She is taking Tamoxifen.  The skin on her left breast has hyperpigmentation.  BP 103/53 mmHg  Pulse 75  Temp(Src) 97.7 F (36.5 C) (Oral)  Resp 16  Ht 5\' 2"  (1.575 m)  Wt 171 lb 1.6 oz (77.61 kg)  BMI 31.29 kg/m2  LMP 01/20/2013   Wt Readings from Last 3 Encounters:  03/09/16 171 lb 1.6 oz (77.61 kg)  02/17/16 171 lb (77.565 kg)  02/12/16 174 lb (78.926 kg)

## 2016-03-09 NOTE — Telephone Encounter (Signed)
"  Kristi [name] [phone]) calling for my mom in reference to her Tamoxifen.  She was off the medicine due to problems with anxiety and depression but resumed it in January/February.  She takes Tamoxifen daily.  She has digressed/worsened and we are now wanting to switch her to the anastrozole.  we're still working with Psychologist and therapist but the alternative medicine might be better for her.  Her next F/U is 05-16-2016.  She can be reached at [phone] if needed.  Uses [facility] in [place]."

## 2016-03-09 NOTE — Progress Notes (Signed)
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 Completed radiation 09/09/2013-10/28/2013 to the left breast to 45 Gy in 25 fractions, the lumpectomy cavity area was boosted to a cumulative dose of 63 Gy.  Narrative:  The patient returns today for routine follow-up. She reports having discomfort in her left breast due to lymphedema. She does have a sleeve and pump but is not consistent with using them. She reports her energy level is fair. She had her last mammogram in March. The skin on her left breast has hyperpigmentation. She is currently going to physical therapy for her breast swelling. She mentions that she hasn't noticed improvements.  She is continuing to have depression and stopped taking the Tamoxifen for a while per Dr. [name]. She started taking it again recently. She seems to be more depressed. She is not suicidal, however. She might need to have Tamoxifen stopped and switched over to anastrozole.  ALLERGIES:  has No Known Allergies.  Meds: Current Outpatient Prescriptions  Medication Sig Dispense Refill  . ALPRAZolam (XANAX) 1 MG tablet Reported on 12/27/2015    . amitriptyline (ELAVIL) 25 MG tablet Take 25 mg by mouth at bedtime.    . Ascorbic Acid (VITAMIN C) 1000 MG tablet Take 1,000 mg by mouth daily.    Marland Kitchen aspirin EC 81 MG tablet Take 162 mg by mouth daily.    . B Complex-C (SUPER B COMPLEX PO) Take 1 tablet by mouth daily.    . cephALEXin (KEFLEX) 500 MG capsule Take 1 capsule (500 mg total) by mouth 2 (two) times daily. (Patient not taking: Reported on 02/28/2016) 20 capsule 0  . clonazePAM (KLONOPIN) 1 MG tablet Take 0.5 mg by mouth 2 (two) times daily.     .  Eszopiclone (ESZOPICLONE) 3 MG TABS Take 3 mg by mouth at bedtime. Take immediately before bedtime    . gabapentin (NEURONTIN) 800 MG tablet Take 1 tablet (800 mg total) by mouth 3 (three) times daily. 90 tablet 2  . KLOR-CON M20 20 MEQ tablet TAKE 2 TABLETS FOR 3 DAYS, THEN TAKE ONCE A DAY 35 tablet 1  . Multiple Vitamin (MULTIVITAMIN WITH MINERALS) TABS tablet Take 1 tablet by mouth daily.    . naproxen (NAPROSYN) 500 MG tablet Take 1 tablet (500 mg total) by mouth 2 (two) times daily. 10 tablet 0  . tamoxifen (NOLVADEX) 20 MG tablet Take 20 mg by mouth daily.    . tamoxifen (NOLVADEX) 20 MG tablet TAKE 1 TABLET (20 MG TOTAL) BY MOUTH DAILY. 30 tablet 5  . valsartan-hydrochlorothiazide (DIOVAN-HCT) 320-25 MG tablet TAKE 1 TABLET BY MOUTH EVERY MORNING. 30 tablet 1  . [DISCONTINUED] prochlorperazine (COMPAZINE) 10 MG tablet Take 1 tablet (10 mg total) by mouth every 6 (six) hours as needed (Nausea or vomiting). 30 tablet 1  . [DISCONTINUED] prochlorperazine (COMPAZINE) 25 MG suppository Place 1 suppository (25 mg total) rectally every 12 (twelve) hours as needed for nausea. 12 suppository 3   No current facility-administered medications for this encounter.    Physical Findings: Filed Vitals:   03/09/16 0859  BP: 103/53  Pulse: 75  Temp: 97.7 F (36.5 C)  Resp: 16   The patient  is in no acute distress. Patient is alert and oriented. Lungs are clear to auscultation bilaterally. Heart has regular rate and rhythm. No palpable cervical, supraclavicular, or axillary adenopathy. Right breast exam noted for no palpable mass or nipple discharge. Continued to have lymphedema in the left breast. The left breast is softer since last exam. No palpable mass or nipple discharge. No signs of recurrence.  Lab Findings: Lab Results  Component Value Date   WBC 5.3 12/27/2015   HGB 11.3* 12/27/2015   HCT 34.6* 12/27/2015   MCV 89.0 12/27/2015   PLT 160.0 12/27/2015    Radiographic Findings: The  mammogram at [facility] on 02/01/2016 shows no evidence of recurrence.  Impression:  No evidence of recurrence on clinical exam today. Continued edema of the left breast but better by my exam.  Plan:  She has a follow up appointment with Dr. [name] 05/16/2016. I asked her to call to see if she could get her appointment moved up to discuss switching to anastrozole. I will follow up with her in 6 months. ____________________________________  Blair Promise, PhD, MD    This document serves as a record of services personally performed by Gery Pray, MD. It was created on his behalf by Lendon Collar, a trained medical scribe. The creation of this record is based on the scribe's personal observations and the provider's statements to them. This document has been checked and approved by the attending provider.

## 2016-03-10 ENCOUNTER — Other Ambulatory Visit: Payer: Self-pay | Admitting: *Deleted

## 2016-03-10 ENCOUNTER — Ambulatory Visit: Payer: BC Managed Care – PPO | Admitting: Physical Therapy

## 2016-03-10 DIAGNOSIS — C50212 Malignant neoplasm of upper-inner quadrant of left female breast: Secondary | ICD-10-CM

## 2016-03-10 MED ORDER — ANASTROZOLE 1 MG PO TABS: 1.0000 mg | ORAL_TABLET | Freq: Every day | ORAL | Status: DC

## 2016-03-10 NOTE — Telephone Encounter (Signed)
Prescription for Anastrozole sent to pharmacy and patient advised of this.

## 2016-03-13 ENCOUNTER — Ambulatory Visit: Payer: BC Managed Care – PPO | Admitting: Physical Therapy

## 2016-03-13 ENCOUNTER — Other Ambulatory Visit (HOSPITAL_COMMUNITY): Payer: BC Managed Care – PPO | Attending: Psychiatry | Admitting: Psychiatry

## 2016-03-13 ENCOUNTER — Encounter (HOSPITAL_COMMUNITY): Payer: Self-pay | Admitting: Psychiatry

## 2016-03-13 DIAGNOSIS — M25512 Pain in left shoulder: Secondary | ICD-10-CM | POA: Diagnosis not present

## 2016-03-13 DIAGNOSIS — Z923 Personal history of irradiation: Secondary | ICD-10-CM | POA: Diagnosis not present

## 2016-03-13 DIAGNOSIS — Z9221 Personal history of antineoplastic chemotherapy: Secondary | ICD-10-CM | POA: Diagnosis not present

## 2016-03-13 DIAGNOSIS — F419 Anxiety disorder, unspecified: Secondary | ICD-10-CM | POA: Insufficient documentation

## 2016-03-13 DIAGNOSIS — Z853 Personal history of malignant neoplasm of breast: Secondary | ICD-10-CM | POA: Diagnosis not present

## 2016-03-13 DIAGNOSIS — F331 Major depressive disorder, recurrent, moderate: Secondary | ICD-10-CM | POA: Insufficient documentation

## 2016-03-13 DIAGNOSIS — I890 Lymphedema, not elsewhere classified: Secondary | ICD-10-CM

## 2016-03-13 DIAGNOSIS — I10 Essential (primary) hypertension: Secondary | ICD-10-CM | POA: Insufficient documentation

## 2016-03-13 DIAGNOSIS — M25612 Stiffness of left shoulder, not elsewhere classified: Secondary | ICD-10-CM | POA: Diagnosis not present

## 2016-03-13 DIAGNOSIS — N644 Mastodynia: Secondary | ICD-10-CM | POA: Diagnosis not present
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
## 2016-03-13 NOTE — Therapy (Signed)
[place], [name], [address] Phone: [phone]   Fax:  [phone]  Physical Therapy Treatment  Patient Details  Name: Kristi [name] MRN: [id number] Date of Birth: 10/15/1956 Referring Provider: Barry Dienes  Encounter Date: 03/13/2016      PT End of Session - 03/13/16 1616    Visit Number 5   Number of Visits 25   Date for PT Re-Evaluation 04/24/16   PT Start Time [id number]   PT Stop Time 1613   PT Time Calculation (min) 56 min   Activity Tolerance Patient tolerated treatment well   Behavior During Therapy [facility] for tasks assessed/performed      Past Medical History  Diagnosis Date  . Anxiety   . Hypertension   . MRSA (methicillin resistant Staphylococcus aureus) 2009    right groin area-no issues now. 04-07-14 PCR screen negative today.  . Depression   . Anemia     Iron deficinecy anemia  . History of radiation therapy 09/09/13-10/28/13    45 gray to left breast, lumpectomy cavity boosted to 63 gray  . Neuropathy ([place])   . Arthritis   . History of chicken pox   . Breast cancer ([place])     left ,last radiation 2'15, last chemo 8'14  . Anxiety and depression 05/15/2014  . Hyperglycemia 01/09/2016    Past Surgical History  Procedure Laterality Date  . Anal sphincterotomy  04/2011  . Hemorrhoid surgery  04/2011    ligation  . Breast lumpectomy with needle localization Left 02/04/2013    Procedure: LEFT BREAST LUMPECTOMY WITH NEEDLE LOCALIZATION;  Surgeon: Stark Klein, MD;  Location: [facility];  Service: General;  Laterality: Left;  . Axillary lymph node dissection Left 02/04/2013    Procedure: LEFT AXILLARY LYMPH NODE DISSECTION;  Surgeon: Stark Klein, MD;  Location: [facility];  Service: General;  Laterality: Left;  End: [id number]  . Portacath placement Right 02/04/2013    Procedure: INSERTION PORT-A-CATH;  Surgeon: Stark Klein, MD;  Location: [facility];  Service: General;  Laterality: Right;  Start [id number].  Marland Kitchen  Appendectomy  1980  . Breast surgery      Lumpectomy in april 2014  . Shoulder arthroscopy with rotator cuff repair and subacromial decompression Left 02/24/2014    Procedure: SHOULDER ARTHROSCOPY WITH ROTATOR CUFF REPAIR AND SUBACROMIAL DECOMPRESSION;  Surgeon: Meredith Pel, MD;  Location: [facility];  Service: Orthopedics;  Laterality: Left;  LEFT SHOULDER DIAGNOSTIC OPERATIVE ARTHROSCOPY, SUBACROMIAL DECOMPRESSION, ROTATOR CUFF TEAR REPAIR  . Port-a-cath removal N/A 04/16/2014    Procedure: REMOVAL PORT-A-CATH;  Surgeon: Stark Klein, MD;  Location: [facility];  Service: General;  Laterality: N/A;    There were no vitals filed for this visit.      Subjective Assessment - 03/13/16 1527    Subjective Talked about starting the group therapy program today at [facility]; she will go daily to that for 2-4 weeks.  Patient talked here today about how she has used the pump and the compression bra and sleeve, but not every day.     Currently in Pain? Yes   Pain Score 3    Pain Location Axilla   Pain Orientation Left   Pain Descriptors / Indicators Other (Comment)  "just a little"   Aggravating Factors  at end range of stretch   Pain Relieving Factors rest                         [facility]  Adult PT Treatment/Exercise - 03/13/16 0001    Shoulder Exercises: Standing   External Rotation Strengthening;Left;10 reps;Theraband   Theraband Level (Shoulder External Rotation) Level 2 (Red)   Internal Rotation Strengthening;Left;10 reps;Theraband   Theraband Level (Shoulder Internal Rotation) Level 2 (Red)   Flexion Strengthening;Left;10 reps;Theraband   Theraband Level (Shoulder Flexion) Level 2 (Red)   Extension Strengthening;Left;10 reps;Theraband   Theraband Level (Shoulder Extension) Level 2 (Red)   Retraction Strengthening;Both;10 reps;Theraband   Theraband Level (Shoulder Retraction) Level 2 (Red)   Shoulder Exercises: Pulleys   Flexion 2 minutes   ABduction 2 minutes    Shoulder Exercises: Therapy Ball   Flexion 10 reps   Shoulder Exercises: ROM/Strengthening   Other ROM/Strengthening Exercises Finger Ladder for Lt UE abduction 5 reps with pt placing her Rt hand on her Lt shoulder for cuing to decrease shoulder hike   Manual Therapy   Manual Lymphatic Drainage (MLD) short neck, superficial and deep abdominals, stimulation of left inguinal and right axillary nodes, established interaxillary and left axillo inguinal anastomoses, drainage of left breast and left upper quadrant and UE from dorsal hand to lateral shoulder working down the arm initially in segments moving fluid towards pathways.                PT Education - 03/13/16 1544    Education provided Yes   Education Details Reviewed [place] 4 and scapular retraction with bent elbows, but using red Theraband today; issued red Theraband but told patient to use yellow until that becomes easy   Person(s) Educated Patient   Methods Explanation;Demonstration;Verbal cues   Comprehension Verbalized understanding;Returned demonstration           Short Term Clinic Goals - 03/06/16 1111    CC Short Term Goal  #1   Title Pt to be able to verbalize lymphedema risk reduction practices   Status On-going   CC Short Term Goal  #2   Title Pt to report decreased pain of less than 3/10 to improve comfort and allow increased use of LUE   Status On-going             [place] [facility] Goals - 03/08/16 1655    CC Long Term Goal  #1   Title Pt to demonstrate 155 degrees of left shoulder flexion to allow pt to reach items overhead   Status On-going   CC Long Term Goal  #2   Title Pt to have appropriate compression bra for long term management of edema   Status On-going   CC Long Term Goal  #3   Title Pt will report a 50% decrease in breast lymphedema symptoms   Status On-going   CC Long Term Goal  #4   Title Pt will report decreased pain of less than 2/10 to allow improved use of LUE   Status  On-going   CC Long Term Goal  #5   Title Pt will have an improved LLIS impairment score of less than 30% to allow improved use of LUE   Status On-going            Plan - 03/13/16 1617    Clinical Impression Statement Patient appears depressed, with flat affect except when verging on tears; she has started an intensive outpatient therapy group at [facility] today.  She describes having second thoughts about having had lumpectomy instead of mastectomy, and having asked her radiation oncologist on recent visit about whether mastectomy now would eliminate lymphedema.  She may speak to her  surgeon about it.  She had not done exercise with Theraband at home and needed review; red Theraband was used and issued for use once yellow becomes easy.     Rehab Potential Good   Clinical Impairments Affecting Rehab Potential none   PT Frequency 3x / week   PT Duration 8 weeks   PT Treatment/Interventions Therapeutic exercise;Manual lymph drainage   PT Next Visit Plan Focus on ROM to Lt UE and progressing HEP. Manual lymph drainage at end of session.  Assess compliance of Maintenance Phase of treatment.    Consulted and Agree with Plan of Care Patient      Patient will benefit from skilled therapeutic intervention in order to improve the following deficits and impairments:  Pain, Impaired UE functional use, Decreased strength, Increased edema  Visit Diagnosis: Lymphedema, not elsewhere classified     Problem List Patient Active Problem List   Diagnosis Date Noted  . Hyperglycemia 01/09/2016  . Chemotherapy-induced peripheral neuropathy ([place]) 09/21/2015  . History of chicken pox   . Shingles 05/15/2014  . Anxiety and depression 05/15/2014  . Rotator cuff tear 02/24/2014  . Hot flashes due to tamoxifen 02/20/2014  . Left shoulder pain with abduction 12/12/2013  . CVA (cerebral infarction) 07/18/2013  . TIA (transient ischemic attack) 07/18/2013  . Contracture of axilla 05/20/2013  .  Primary cancer of upper inner quadrant of left female breast ([place]) 01/24/2013  . Anal fissure 05/03/2011  . Hemorrhoids, internal, with prolapse 05/03/2011    SALISBURY,DONNA 03/13/2016, 4:22 PM  [place], [name], [address] Phone: [phone]   Fax:  [phone]  Name: Kristi [name] MRN: [id number] Date of Birth: [birth year]    Serafina Royals, PT 03/13/2016 4:22 PM

## 2016-03-14 ENCOUNTER — Encounter (HOSPITAL_COMMUNITY): Payer: Self-pay | Admitting: Psychiatry

## 2016-03-14 ENCOUNTER — Other Ambulatory Visit (HOSPITAL_COMMUNITY): Payer: BC Managed Care – PPO | Admitting: Psychiatry

## 2016-03-14 DIAGNOSIS — F411 Generalized anxiety disorder: Secondary | ICD-10-CM

## 2016-03-14 DIAGNOSIS — F331 Major depressive disorder, recurrent, moderate: Secondary | ICD-10-CM

## 2016-03-14 NOTE — Progress Notes (Signed)
Psychiatric Initial Adult Assessment   Patient Identification: Kristi [name] MRN:  [id number] Date of Evaluation:  03/14/2016 Referral Source: Jill Poling, NP Chief Complaint:   Visit Diagnosis:    ICD-9-CM ICD-10-CM   1. Major depressive disorder, recurrent episode, moderate (HCC) 296.32 F33.1     History of Present Illness:  [name] says she was okay for the most part until she mother suffered from dementia and died from it in 2012-02-19. Probably was depressed then she says but dealt with it through work and helping her mother which was both sad and rewarding at the same time..  After she died she did not have time to recover before she was diagnosed with breast cancer treated with lumpectomy, chemotherapy and radiation.  She is left with lymphedema and neuralgias which has caused her to not be able to work.  She liked working and misses that a lot.  Currently she is at home all day feeling bored and anxious.  She is in pain all the time so she cannot feel relief except when she is asleep.  Consequently her quality of life has gone.  She is sad, has no interest in anything or anybody, has no energy or motivation, has crying spells, feels life is not worth living, has lost touch with God who has been a major support to her over her life and feels guilty for not doing better.  Anxiety has increased though when she describes this the anxiety seems more the depression and the dread of facing another day.  Associated Signs/Symptoms: Depression Symptoms:  depressed mood, anhedonia, insomnia, fatigue, feelings of worthlessness/guilt, difficulty concentrating, hopelessness, impaired memory, anxiety, (Hypo) Manic Symptoms:  Irritable Mood, Anxiety Symptoms:  Excessive Worry, Psychotic Symptoms:  none PTSD Symptoms: Negative  Past Psychiatric History: none other than related to events described in the present illness  Previous Psychotropic Medications: Yes   Substance Abuse History in the  last 12 months:  No.  Consequences of Substance Abuse: Negative  Past Medical History:  Past Medical History  Diagnosis Date  . Anxiety   . Hypertension   . MRSA (methicillin resistant Staphylococcus aureus) 02/19/08    right groin area-no issues now. 04-07-14 PCR screen negative today.  . Depression   . Anemia     Iron deficinecy anemia  . History of radiation therapy 09/09/13-10/28/13    45 gray to left breast, lumpectomy cavity boosted to 63 gray  . Neuropathy ([place])   . Arthritis   . History of chicken pox   . Breast cancer ([place])     left ,last radiation 2'15, last chemo 8'14  . Anxiety and depression 05/15/2014  . Hyperglycemia 01/09/2016    Past Surgical History  Procedure Laterality Date  . Anal sphincterotomy  04/2011  . Hemorrhoid surgery  04/2011    ligation  . Breast lumpectomy with needle localization Left 2013/02/18    Procedure: LEFT BREAST LUMPECTOMY WITH NEEDLE LOCALIZATION;  Surgeon: Stark Klein, MD;  Location: [place];  Service: General;  Laterality: Left;  . Axillary lymph node dissection Left 02-18-13    Procedure: LEFT AXILLARY LYMPH NODE DISSECTION;  Surgeon: Stark Klein, MD;  Location: [place];  Service: General;  Laterality: Left;  End: [id number]  . Portacath placement Right 02-18-13    Procedure: INSERTION PORT-A-CATH;  Surgeon: Stark Klein, MD;  Location: [place];  Service: General;  Laterality: Right;  Start [id number].  Marland Kitchen Appendectomy  1980  . Breast surgery      Lumpectomy in  april 2014  . Shoulder arthroscopy with rotator cuff repair and subacromial decompression Left 02/24/2014    Procedure: SHOULDER ARTHROSCOPY WITH ROTATOR CUFF REPAIR AND SUBACROMIAL DECOMPRESSION;  Surgeon: Meredith Pel, MD;  Location: [place];  Service: Orthopedics;  Laterality: Left;  LEFT SHOULDER DIAGNOSTIC OPERATIVE ARTHROSCOPY, SUBACROMIAL DECOMPRESSION, ROTATOR CUFF TEAR REPAIR  . Port-a-cath removal N/A 04/16/2014    Procedure: REMOVAL PORT-A-CATH;  Surgeon: Stark Klein, MD;  Location:  [facility];  Service: General;  Laterality: N/A;    Family Psychiatric History: none related  Family History:  Family History  Problem Relation Age of Onset  . Lung cancer Father   . Hypertension Father   . Thyroid cancer Father     dx in his 23s  . Cancer Father     lung, thyroid, smoker  . Breast cancer Paternal Aunt 66  . Colon cancer Paternal Aunt     dx in her 50x  . Cervical cancer Paternal Aunt     dzx in her 40s  . Ovarian cancer Cousin     dx in her lage 39s  . Breast cancer Cousin     maternal first cousin, once removed; dx in her late 6s  . Breast cancer Cousin     maternal first cousin once removed; dx in late 2s  . Hypertension Mother   . Diabetes Mother   . Dementia Mother   . Hypertension Brother   . Seizures Brother     Alcohol induced.  . Alcohol abuse Brother     drinker, smoker  . Cancer Paternal Uncle     oral cancer  . Kidney cancer Paternal Grandmother   . Cancer Cousin     several paternal cousins with brain cancer, leukemia, and other cancers  . Arthritis Daughter     back surgery    Social History:   Social History   Social History  . Marital Status: Married    Spouse Name: [name]  . Number of Children: 1  . Years of Education: 12   Occupational History  . [employer]   Social History Main Topics  . Smoking status: Never Smoker   . Smokeless tobacco: Never Used  . Alcohol Use: No  . Drug Use: No  . Sexual Activity: No     Comment: lives with husband, disability/retirement. [employer], no dietary restrictions   Other Topics Concern  . None   Social History Narrative   Patient is married [name]) and lives at home with her husband.   Patient has one daughter.   Patient is working at [employer]   Patient has a 12th grade education.   Patient drinks very little caffeine.    Additional Social History: none  Allergies:  No Known Allergies  Metabolic Disorder Labs: No results found for:  HGBA1C, MPG No results found for: PROLACTIN Lab Results  Component Value Date   CHOL 165 12/27/2015   TRIG 126.0 12/27/2015   HDL 56.10 12/27/2015   CHOLHDL 3 12/27/2015   VLDL 25.2 12/27/2015   LDLCALC 84 12/27/2015   LDLCALC 77 07/19/2013     Current Medications: Current Outpatient Prescriptions  Medication Sig Dispense Refill  . ALPRAZolam (XANAX) 1 MG tablet Reported on 12/27/2015    . amitriptyline (ELAVIL) 25 MG tablet Take 25 mg by mouth at bedtime.    Marland Kitchen anastrozole (ARIMIDEX) 1 MG tablet Take 1 tablet (1 mg total) by mouth daily. 90 tablet 3  . Ascorbic Acid (VITAMIN  C) 1000 MG tablet Take 1,000 mg by mouth daily.    Marland Kitchen aspirin EC 81 MG tablet Take 162 mg by mouth daily.    . B Complex-C (SUPER B COMPLEX PO) Take 1 tablet by mouth daily.    . clonazePAM (KLONOPIN) 1 MG tablet Take 0.5 mg by mouth 2 (two) times daily.     . Eszopiclone (ESZOPICLONE) 3 MG TABS Take 3 mg by mouth at bedtime. Take immediately before bedtime    . gabapentin (NEURONTIN) 800 MG tablet Take 1 tablet (800 mg total) by mouth 3 (three) times daily. 90 tablet 2  . KLOR-CON M20 20 MEQ tablet TAKE 2 TABLETS FOR 3 DAYS, THEN TAKE ONCE A DAY 35 tablet 1  . Multiple Vitamin (MULTIVITAMIN WITH MINERALS) TABS tablet Take 1 tablet by mouth daily.    . naproxen (NAPROSYN) 500 MG tablet Take 1 tablet (500 mg total) by mouth 2 (two) times daily. (Patient not taking: Reported on 03/09/2016) 10 tablet 0  . valsartan-hydrochlorothiazide (DIOVAN-HCT) 320-25 MG tablet TAKE 1 TABLET BY MOUTH EVERY MORNING. 30 tablet 1  . [DISCONTINUED] prochlorperazine (COMPAZINE) 10 MG tablet Take 1 tablet (10 mg total) by mouth every 6 (six) hours as needed (Nausea or vomiting). 30 tablet 1  . [DISCONTINUED] prochlorperazine (COMPAZINE) 25 MG suppository Place 1 suppository (25 mg total) rectally every 12 (twelve) hours as needed for nausea. 12 suppository 3   No current facility-administered medications for this visit.     Neurologic: Headache: Negative Seizure: Negative Paresthesias:Negative  Musculoskeletal: Strength & Muscle Tone: within normal limits Gait & Station: normal Patient leans: N/A  Psychiatric Specialty Exam: ROS  Last menstrual period 01/20/2013.There is no weight on file to calculate BMI.  General Appearance: Well Groomed  Eye Contact:  Good  Speech:  Clear and Coherent  Volume:  Normal  Mood:  Depressed  Affect:  Congruent  Thought Process:  Coherent and Logical  Orientation:  Full (Time, Place, and Person)  Thought Content:  Negative  Suicidal Thoughts:  No  Homicidal Thoughts:  No  Memory:  Immediate;   Good Recent;   Good Remote;   Good  Judgement:  Intact  Insight:  Good  Psychomotor Activity:  Wide Base  Concentration:  Good  Recall:  Good  Fund of Knowledge:Good  Language: Good  Akathisia:  Negative  Handed:  Right  AIMS (if indicated):  0  Assets:  Communication Skills Desire for Improvement Financial Resources/Insurance Housing Intimacy Social Support Talents/Skills Transportation Vocational/Educational  ADL's:  Intact  Cognition: WNL  Sleep:  Hard to get to sleep    Treatment Plan Summary: daily group therapy   Donnelly Angelica, MD 5/9/20171:40 PM

## 2016-03-14 NOTE — Progress Notes (Signed)
    Daily Group Progress Note  Program: IOP  Group Time: 11-12  Participation Level: Minimal  Behavioral Response: Appropriate  Type of Therapy:  Psycho-education Group  Summary of Progress: Pt participated in a discussion and activity on mindfulness.     Shaneice Barsanti S, Licensed Cli

## 2016-03-14 NOTE — Progress Notes (Signed)
    Daily Group Progress Note  Program: IOP  Group Time: 9:00-11:00  Participation Level: Active  Behavioral Response: Appropriate  Type of Therapy:  Group Therapy  Summary of Progress: Pt. Appeared sleepy, lethargic, depressed. Pt. Met with psychiatrist and case manager due to first day in group. Pt. Shared with the group that she is a breast cancer survivor and was not able to return to work after her treatments. Pt. Is expecting a full recovery from her treatments that ended three years ago, but continues to experience grief related to loss of work. Pt. Shared with the group that she misses sense of purpose and social connection that she found at work.     Nancie Neas, LPC

## 2016-03-15 ENCOUNTER — Ambulatory Visit: Payer: BC Managed Care – PPO

## 2016-03-15 ENCOUNTER — Other Ambulatory Visit (HOSPITAL_COMMUNITY): Payer: BC Managed Care – PPO | Admitting: Psychiatry

## 2016-03-15 DIAGNOSIS — M25612 Stiffness of left shoulder, not elsewhere classified: Secondary | ICD-10-CM | POA: Diagnosis not present

## 2016-03-15 DIAGNOSIS — I890 Lymphedema, not elsewhere classified: Secondary | ICD-10-CM | POA: Diagnosis not present

## 2016-03-15 DIAGNOSIS — N644 Mastodynia: Secondary | ICD-10-CM | POA: Diagnosis not present

## 2016-03-15 DIAGNOSIS — F331 Major depressive disorder, recurrent, moderate: Secondary | ICD-10-CM | POA: Diagnosis not present

## 2016-03-15 DIAGNOSIS — M25512 Pain in left shoulder: Secondary | ICD-10-CM | POA: Diagnosis not present

## 2016-03-15 NOTE — Therapy (Signed)
[place], [name], [address] Phone: [phone]   Fax:  [phone]  Physical Therapy Treatment  Patient Details  Name: Kristi [name] MRN: [id number] Date of Birth: Jun 27, 1956 Referring Provider: Barry Dienes  Encounter Date: 03/15/2016      PT End of Session - 03/15/16 1347    Visit Number 6   Number of Visits 25   Date for PT Re-Evaluation 04/24/16   PT Start Time 1301   PT Stop Time 1350   PT Time Calculation (min) 49 min   Activity Tolerance Patient tolerated treatment well   Behavior During Therapy [facility] for tasks assessed/performed      Past Medical History  Diagnosis Date  . Anxiety   . Hypertension   . MRSA (methicillin resistant Staphylococcus aureus) 2009    right groin area-no issues now. 04-07-14 PCR screen negative today.  . Depression   . Anemia     Iron deficinecy anemia  . History of radiation therapy 09/09/13-10/28/13    45 gray to left breast, lumpectomy cavity boosted to 63 gray  . Neuropathy ([place])   . Arthritis   . History of chicken pox   . Breast cancer ([place])     left ,last radiation 2'15, last chemo 8'14  . Anxiety and depression 05/15/2014  . Hyperglycemia 01/09/2016    Past Surgical History  Procedure Laterality Date  . Anal sphincterotomy  04/2011  . Hemorrhoid surgery  04/2011    ligation  . Breast lumpectomy with needle localization Left 02/04/2013    Procedure: LEFT BREAST LUMPECTOMY WITH NEEDLE LOCALIZATION;  Surgeon: Stark Klein, MD;  Location: [place];  Service: General;  Laterality: Left;  . Axillary lymph node dissection Left 02/04/2013    Procedure: LEFT AXILLARY LYMPH NODE DISSECTION;  Surgeon: Stark Klein, MD;  Location: [place];  Service: General;  Laterality: Left;  End: 3419  . Portacath placement Right 02/04/2013    Procedure: INSERTION PORT-A-CATH;  Surgeon: Stark Klein, MD;  Location: [place];  Service: General;  Laterality: Right;  Start Time: 3790.  Marland Kitchen  Appendectomy  1980  . Breast surgery      Lumpectomy in april 2014  . Shoulder arthroscopy with rotator cuff repair and subacromial decompression Left 02/24/2014    Procedure: SHOULDER ARTHROSCOPY WITH ROTATOR CUFF REPAIR AND SUBACROMIAL DECOMPRESSION;  Surgeon: Meredith Pel, MD;  Location: [place];  Service: Orthopedics;  Laterality: Left;  LEFT SHOULDER DIAGNOSTIC OPERATIVE ARTHROSCOPY, SUBACROMIAL DECOMPRESSION, ROTATOR CUFF TEAR REPAIR  . Port-a-cath removal N/A 04/16/2014    Procedure: REMOVAL PORT-A-CATH;  Surgeon: Stark Klein, MD;  Location[facility];  Service: General;  Laterality: N/A;    There were no vitals filed for this visit.      Subjective Assessment - 03/15/16 1310    Subjective Started group therapy Monday and it's going to be every day for 3 weeks. It's going just okay, no one else in there has had cancer so it's hard for me to feel like I have a connection to the group. I just feel like an outsider. Using my pump every day and wearing my compression sleeve, but my compression bra is really just uncomfortable. I mopped a little yesterday and don't hurt more than usual.   Pertinent History Left lumpectomy and axillary lymph node dissection 02/04/13 followed by chemotherapy and radiation to left breast and axilla. She has struggled significantly with bilateral feet neuropathy, anxiety and depression.   Patient Stated Goals to have my breasts feel  some sort of normal   Currently in Pain? Yes   Pain Score 1    Pain Location Axilla   Pain Orientation Left   Pain Descriptors / Indicators Sore   Pain Type Chronic pain   Pain Onset More than a month ago   Pain Frequency Constant   Aggravating Factors  just feel it a little in the back ground lately   Pain Relieving Factors rest            [facility] PT Assessment - 03/15/16 0001    AROM   Left Shoulder Flexion 143 Degrees   Left Shoulder ABduction 160 Degrees  With assist from therapist to decrease shoulder hike            LYMPHEDEMA/ONCOLOGY QUESTIONNAIRE - 03/15/16 1339    Left Upper Extremity Lymphedema   15 cm Proximal to Olecranon Process 29.7 cm   Olecranon Process 24.5 cm   15 cm Proximal to Ulnar Styloid Process 25.1 cm   Just Proximal to Ulnar Styloid Process 14.8 cm   Across Hand at [employer] 18.1 cm   At [place] of 2nd Digit 6 cm                  [facility] Adult PT Treatment/Exercise - 03/15/16 0001    Shoulder Exercises: Pulleys   Flexion 2 minutes   ABduction 2 minutes   Manual Therapy   Manual Lymphatic Drainage (MLD) Briefly at end of session: short neck, superficial and deep abdominals, stimulation of left inguinal and right axillary nodes and upper quadrant, established interaxillary and left axillo inguinal anastomoses, drainage of left breast and left upper quadrant and UE from dorsal hand to lateral shoulder working down the arm initially in segments moving fluid towards pathways.   Passive ROM In Supine P/ROM to Lt UE into flexion, abduction and D2 to pts tolerance                   Short Term Clinic Goals - 03/15/16 1346    CC Short Term Goal  #1   Title Pt to be able to verbalize lymphedema risk reduction practices   Status Achieved   CC Short Term Goal  #2   Title Pt to report decreased pain of less than 3/10 to improve comfort and allow increased use of LUE  Pt reports "it hasn't really bothered me much the last few days".    Status Achieved             Long Term Clinic Goals - 03/15/16 1355    CC Long Term Goal  #1   Title Pt to demonstrate 155 degrees of left shoulder flexion to allow pt to reach items overhead   Baseline 140, 160 degrees with assist from therapist to decrease shoulder hike 03/15/16   Status Partially Met   CC Long Term Goal  #2   Title Pt to have appropriate compression bra for long term management of edema  Discussed with pt today to go to [facility] [place] and see if they have any solutions for a more comfortable  bra and to bring/wear the one she has now for them to see.   Status On-going   CC Long Term Goal  #3   Title Pt will report a 50% decrease in breast lymphedema symptoms  Pt reports slightly improved after pump, but doesn't feel like hse has noticed an overall improvement though this therapist feels her fibrosis is softer today.   Status On-going  CC Long Term Goal  #4   Title Pt will report decreased pain of less than 2/10 to allow improved use of LUE   Baseline 4, 1/10 today and pt reports last few days it "hasn't hurt her much, she hasn't really noticed it (pain)"    Status Achieved   CC Long Term Goal  #5   Title Pt will have an improved LLIS impairment score of less than 30% to allow improved use of LUE   Status On-going            Plan - 03/15/16 1348    Clinical Impression Statement Pt reported she has begun her group therapy sessions and isn't too happy about it at this time due to feeling like she can't connect to anyone in the group as she is the only one who has had cancer. Suggested pt look into a support group at the [facility] and/or ask about the program where they can get hooked up with a pt that has transitioned into being a support person. Pt verbalized understanding and said she would look into this. Also instructed her to go to [facility] with her current compression bra and see if they can possibly fit her with a new one or come up with a more comfortable solution. Pts A/ROM measurements have improved meeting STG's and one LTG.   Rehab Potential Good   Clinical Impairments Affecting Rehab Potential none   PT Frequency 3x / week   PT Duration 8 weeks   PT Treatment/Interventions Therapeutic exercise;Manual lymph drainage   PT Next Visit Plan Cont to focus on ROM to Lt UE and progressing HEP. Manual lymph drainage at end of session.  Assess compliance of Maintenance Phase of treatment.    Recommended Other Services Go to [facility] for possibly new  compression bra.    Consulted and Agree with Plan of Care Patient      Patient will benefit from skilled therapeutic intervention in order to improve the following deficits and impairments:  Pain, Impaired UE functional use, Decreased strength, Increased edema  Visit Diagnosis: Lymphedema, not elsewhere classified     Problem List Patient Active Problem List   Diagnosis Date Noted  . Generalized anxiety disorder 03/14/2016    Class: Chronic  . Hyperglycemia 01/09/2016  . Chemotherapy-induced peripheral neuropathy ([place]) 09/21/2015  . History of chicken pox   . Shingles 05/15/2014  . Anxiety and depression 05/15/2014  . Rotator cuff tear 02/24/2014  . Hot flashes due to tamoxifen 02/20/2014  . Left shoulder pain with abduction 12/12/2013  . CVA (cerebral infarction) 07/18/2013  . TIA (transient ischemic attack) 07/18/2013  . Contracture of axilla 05/20/2013  . Primary cancer of upper inner quadrant of left female breast ([place]) 01/24/2013  . Anal fissure 05/03/2011  . Hemorrhoids, internal, with prolapse 05/03/2011    Otelia Limes, PTA 03/15/2016, 2:05 PM  [place], [name], [address] Phone: [phone]   Fax:  [phone]  Name: Kristi [name] MRN: [id number] Date of Birth: Mar 14, 1956

## 2016-03-15 NOTE — Progress Notes (Signed)
    Daily Group Progress Note  Program: IOP  Group Time: 9-12:00  Participation Level: Active  Behavioral Response: Appropriate  Type of Therapy:  Group Therapy  Summary of Progress: Pt. Presents with flat affect, tearful, depressed. Pt. Was engaged in group process, attentive, and receptive to feedback from the group. Pt. Shared current history of breast pain and neuropathy in her feet. Pt. Shared that she continues to miss her job and fears that she will not be able to enjoy her life due to her pain. Pt. Participated in conversation about use of breathwork and moment to moment awareness for management of pain and stress.     Nancie Neas, LPC

## 2016-03-16 ENCOUNTER — Other Ambulatory Visit (HOSPITAL_COMMUNITY): Payer: BC Managed Care – PPO | Admitting: Psychiatry

## 2016-03-16 DIAGNOSIS — F331 Major depressive disorder, recurrent, moderate: Secondary | ICD-10-CM

## 2016-03-17 ENCOUNTER — Other Ambulatory Visit (HOSPITAL_COMMUNITY): Payer: BC Managed Care – PPO | Admitting: Psychiatry

## 2016-03-17 DIAGNOSIS — F331 Major depressive disorder, recurrent, moderate: Secondary | ICD-10-CM

## 2016-03-17 NOTE — Progress Notes (Signed)
    Daily Group Progress Note  Program: IOP  Group Time: 9-12:00  Participation Level: Active  Behavioral Response: Appropriate  Type of Therapy:  Group Therapy  Summary of Progress: Pt. Presented with primarily flat affect, tearful, engaged in the group process, less lethargic and sleepy than previous groups. Pt. Discussed process of grieving her pre-cancer body and sense of self. Pt. Participated in instruction about the development of self-compassion i.e, mindfulness of present moment feeling, acknowledgement of common identity, and use of words of kindness toward self.      Nancie Neas, LPC

## 2016-03-17 NOTE — Progress Notes (Signed)
    Daily Group Progress Note  Program: IOP Group Time: 9:00-10:30  Participation Level: Active  Behavioral Response: Appropriate  Type of Therapy: Group Therapy  Summary of Progress: Pt. Presented with significantly improved affect compared to yesterday's group. Pt. Continues to present as quiet, but was not tearful and smiled appropriately. Pt. Reported that she was "ok". Pt. Also reported that she has been cooking more since the start of group, but is putting less pressure on herself to cook.     Group Time: 10:30-12:00  Participation Level: Active  Behavioral Response: Appropriate  Type of Therapy: Psycho-education Group  Summary of Progress: Pt. Participated in grief and loss group with the Chaplain.  Nancie Neas, LPC

## 2016-03-20 ENCOUNTER — Ambulatory Visit: Payer: BC Managed Care – PPO | Admitting: Physical Therapy

## 2016-03-20 ENCOUNTER — Other Ambulatory Visit (HOSPITAL_COMMUNITY): Payer: BC Managed Care – PPO | Admitting: Licensed Clinical Social Worker

## 2016-03-20 DIAGNOSIS — N644 Mastodynia: Secondary | ICD-10-CM | POA: Diagnosis not present

## 2016-03-20 DIAGNOSIS — I890 Lymphedema, not elsewhere classified: Secondary | ICD-10-CM

## 2016-03-20 DIAGNOSIS — M25512 Pain in left shoulder: Secondary | ICD-10-CM | POA: Diagnosis not present

## 2016-03-20 DIAGNOSIS — F331 Major depressive disorder, recurrent, moderate: Secondary | ICD-10-CM

## 2016-03-20 DIAGNOSIS — F411 Generalized anxiety disorder: Secondary | ICD-10-CM

## 2016-03-20 DIAGNOSIS — M25612 Stiffness of left shoulder, not elsewhere classified: Secondary | ICD-10-CM | POA: Diagnosis not present

## 2016-03-20 NOTE — Progress Notes (Signed)
   THERAPIST PROGRESS NOTE  Session Time: 9-12  Participation Level: Minimal  Behavioral Response: CasualLethargicDepressed  Type of Therapy: Psycho-education Group/Group Therapy  Treatment Goals addressed: Anxiety/Depression  Interventions: CBT, Motivational Interviewing and Solution Focused  Summary: ALTHERIA VONG is a 60 y.o. female. First part of group was facilitated by the [facility] pharmacist on medication management.  Suicidal/Homicidal: No  Therapist Response: Pt presented depressed and participated in group process minimally. Pt does not think the program is helping her. She was challenged to participate and put more effort in the group to get more of what she needs to assist with her coping skills for anxiety and depression.  Plan: Continue in IOP program  Diagnosis: Axis I: Depression    Axis II: Anxiety    Aamina Skiff S, Licensed Cli [id number]

## 2016-03-20 NOTE — Therapy (Signed)
[place], [name], [address] Phone: [phone]   Fax:  [phone]  Physical Therapy Treatment  Patient Details  Name: Kristi [name] MRN: [id number] Date of Birth: 08/22/1956 Referring Provider: Barry Dienes  Encounter Date: 03/20/2016      PT End of Session - 03/20/16 1716    Visit Number 7   Number of Visits 25   Date for PT Re-Evaluation 04/24/16   PT Start Time 1525   PT Stop Time 1611   PT Time Calculation (min) 46 min   Activity Tolerance Patient tolerated treatment well   Behavior During Therapy [facility] for tasks assessed/performed      Past Medical History  Diagnosis Date  . Anxiety   . Hypertension   . MRSA (methicillin resistant Staphylococcus aureus) 2009    right groin area-no issues now. 04-07-14 PCR screen negative today.  . Depression   . Anemia     Iron deficinecy anemia  . History of radiation therapy 09/09/13-10/28/13    45 gray to left breast, lumpectomy cavity boosted to 63 gray  . Neuropathy ([place])   . Arthritis   . History of chicken pox   . Breast cancer ([place])     left ,last radiation 2'15, last chemo 8'14  . Anxiety and depression 05/15/2014  . Hyperglycemia 01/09/2016    Past Surgical History  Procedure Laterality Date  . Anal sphincterotomy  04/2011  . Hemorrhoid surgery  04/2011    ligation  . Breast lumpectomy with needle localization Left 02/04/2013    Procedure: LEFT BREAST LUMPECTOMY WITH NEEDLE LOCALIZATION;  Surgeon: Stark Klein, MD;  Location: [place];  Service: General;  Laterality: Left;  . Axillary lymph node dissection Left 02/04/2013    Procedure: LEFT AXILLARY LYMPH NODE DISSECTION;  Surgeon: Stark Klein, MD;  Location: [place];  Service: General;  Laterality: Left;  End: 0947  . Portacath placement Right 02/04/2013    Procedure: INSERTION PORT-A-CATH;  Surgeon: Stark Klein, MD;  Location: [place];  Service: General;  Laterality: Right;  Start Time: 0962.  Marland Kitchen  Appendectomy  1980  . Breast surgery      Lumpectomy in april 2014  . Shoulder arthroscopy with rotator cuff repair and subacromial decompression Left 02/24/2014    Procedure: SHOULDER ARTHROSCOPY WITH ROTATOR CUFF REPAIR AND SUBACROMIAL DECOMPRESSION;  Surgeon: Meredith Pel, MD;  Location: [place];  Service: Orthopedics;  Laterality: Left;  LEFT SHOULDER DIAGNOSTIC OPERATIVE ARTHROSCOPY, SUBACROMIAL DECOMPRESSION, ROTATOR CUFF TEAR REPAIR  . Port-a-cath removal N/A 04/16/2014    Procedure: REMOVAL PORT-A-CATH;  Surgeon: Stark Klein, MD;  [facility];  Service: General;  Laterality: N/A;    There were no vitals filed for this visit.      Subjective Assessment - 03/20/16 1535    Subjective Nothing new.  Still doing daily therapy.  "I'm not really happy with that.  You know, it's in a group setting and my situation is totally different than theirs."                         [facility] Adult PT Treatment/Exercise - 03/20/16 0001    Self-Care   Self-Care Other Self-Care Comments   Other Self-Care Comments  Talked to patient about support group, [employer], and [employer] programs available to her.  Talked about looking at her compression bra and checking to see if she might need to try a different one.  Mentioned [employer] and patient retreat.  Also phoned and left message for Gay Filler about adjusting Flexitouch.  Also, gave patient a rectangle of 1/2 inch gray foam in TG soft to try with her compression bra to ease digging it at left axilla area.   Shoulder Exercises: Pulleys   Flexion 2 minutes   ABduction 2 minutes   Shoulder Exercises: Therapy Ball   Flexion 10 reps   Manual Therapy   Manual Lymphatic Drainage (MLD) In supine: short neck, superficial abdomen, right axilla and anterior interaxillary anastomosis, left groin and axillo-inguinal anastomosis, and breast, directing towards pathways; also left upper arm.  In right sidelying, posterior interaxillary anastomosis from  left to right, lateral left breast, and left axillo-inguinal anastomosis.                PT Education - 03/20/16 1715    Education provided Yes   Education Details about FYNN, [employer] retreat, [place] support groups   Person(s) Educated Patient   Methods Explanation;Handout   Comprehension Verbalized understanding           Short Term Clinic Goals - 03/15/16 1346    CC Short Term Goal  #1   Title Pt to be able to verbalize lymphedema risk reduction practices   Status Achieved   CC Short Term Goal  #2   Title Pt to report decreased pain of less than 3/10 to improve comfort and allow increased use of LUE  Pt reports "it hasn't really bothered me much the last few days".    Status Achieved             Long Term Clinic Goals - 03/15/16 1355    CC Long Term Goal  #1   Title Pt to demonstrate 155 degrees of left shoulder flexion to allow pt to reach items overhead   Baseline 140, 160 degrees with assist from therapist to decrease shoulder hike 03/15/16   Status Partially Met   CC Long Term Goal  #2   Title Pt to have appropriate compression bra for long term management of edema  Discussed with pt today to go to [facility] and see if they have any solutions for a more comfortable bra and to bring/wear the one she has now for them to see.   Status On-going   CC Long Term Goal  #3   Title Pt will report a 50% decrease in breast lymphedema symptoms  Pt reports slightly improved after pump, but doesn't feel like hse has noticed an overall improvement though this therapist feels her fibrosis is softer today.   Status On-going   CC Long Term Goal  #4   Title Pt will report decreased pain of less than 2/10 to allow improved use of LUE   Baseline 4, 1/10 today and pt reports last few days it "hasn't hurt her much, she hasn't really noticed it (pain)"    Status Achieved   CC Long Term Goal  #5   Title Pt will have an improved LLIS impairment score of less than 30%  to allow improved use of LUE   Status On-going            Plan - 03/20/16 1716    Clinical Impression Statement Patient was provided a lot of information about support group options today as she feels the group therapy she is currently doing is not a good fit for her.  She also asked again about mastectomy as an option for her current breast discomfort, and discussed this with Leone Payor,  PT, as well as this Probation officer.  She was encouraged to ask her surgeon about this and also not to make a decision right away.   Rehab Potential Good   PT Frequency 3x / week   PT Duration 8 weeks   PT Treatment/Interventions Therapeutic exercise;Manual lymph drainage;ADLs/Self Care Home Management   PT Next Visit Plan Check her current compression bra and work with patient to find one that will not be uncomfortable for her.  Reassess goals.  Cont to focus on ROM to Lt UE and progressing HEP. Manual lymph drainage at end of session.  Assess compliance of Maintenance Phase of treatment.    Consulted and Agree with Plan of Care Patient      Patient will benefit from skilled therapeutic intervention in order to improve the following deficits and impairments:  Pain, Impaired UE functional use, Decreased strength, Increased edema  Visit Diagnosis: Lymphedema, not elsewhere classified     Problem List Patient Active Problem List   Diagnosis Date Noted  . Generalized anxiety disorder 03/14/2016    Class: Chronic  . Hyperglycemia 01/09/2016  . Chemotherapy-induced peripheral neuropathy ([place]) 09/21/2015  . History of chicken pox   . Shingles 05/15/2014  . Anxiety and depression 05/15/2014  . Rotator cuff tear 02/24/2014  . Hot flashes due to tamoxifen 02/20/2014  . Left shoulder pain with abduction 12/12/2013  . CVA (cerebral infarction) 07/18/2013  . TIA (transient ischemic attack) 07/18/2013  . Contracture of axilla 05/20/2013  . Primary cancer of upper inner quadrant of left female breast ([place])  01/24/2013  . Anal fissure 05/03/2011  . Hemorrhoids, internal, with prolapse 05/03/2011    Geovanie Winnett 03/20/2016, 5:22 PM  [place], [name], [address] Phone: [phone]   Fax:  [phone]  Name: SHANEY DECKMAN MRN: [id number] Date of Birth: 14-May-1956    Serafina Royals, PT 03/20/2016 5:22 PM

## 2016-03-21 ENCOUNTER — Other Ambulatory Visit (HOSPITAL_COMMUNITY): Payer: BC Managed Care – PPO | Admitting: Psychiatry

## 2016-03-21 DIAGNOSIS — F331 Major depressive disorder, recurrent, moderate: Secondary | ICD-10-CM | POA: Diagnosis not present

## 2016-03-21 MED ORDER — AMITRIPTYLINE HCL 25 MG PO TABS: 100.0000 mg | ORAL_TABLET | Freq: Every day | ORAL | Status: DC

## 2016-03-21 MED ORDER — CLONAZEPAM 1 MG PO TABS: 1.0000 mg | ORAL_TABLET | Freq: Three times a day (TID) | ORAL | Status: DC

## 2016-03-21 NOTE — Progress Notes (Signed)
Patient ID: Kristi [name], female   DOB: 1956-05-01, 60 y.o.   MRN: [id number] Kristi [name] says medications have not helped.  The groups are not helping either.  Anxiety is as bad as ever and depression is just as bad.  Plan:  Recommended to continue IOP as it gets her out of the house and around supportive people and less time to feed into her misery.  Will increase the clonazepam to 1 mg tid for anxiety and increase the nortriptyline to 100 mg from 75 mg to help with depressio, anxiety and depression

## 2016-03-21 NOTE — Progress Notes (Signed)
  IOP Group Progress Note Group Time: 10:45-12:00  Participation Level: Active  Behavioral Response: Appropriate  Type of Therapy: Psychoeducation  Summary of Progress: Pt. Participated in mindfulness activity - "The Raisin," Pt participated in a discussion about the importance in using mindfulness as a coping skill for depression and anxiety. Pt joined in the discussion sharing how the mindfulness activity could be used any time of the day as a coping skill to being more present and focused.          Scharlene Catalina S, Licensed Cli

## 2016-03-21 NOTE — Progress Notes (Signed)
    Daily Group Progress Note  Program: IOP  Group Time: 9:00-10:30  Participation Level: Minimal  Behavioral Response: Appropriate  Type of Therapy:  Group Therapy  Summary of Progress: Pt. Presented as quiet, lethargic, sleepy. Pt. Minimally participated in the group process. On of the patients asked her if she had followed up or spoken with her surgeon and she shook her head "no". Pt. Smiled faintly when she was asked how she was doing today and reported "not much better".   Eloise Levels, Ph.D., [facility]

## 2016-03-22 ENCOUNTER — Ambulatory Visit: Payer: BC Managed Care – PPO

## 2016-03-22 ENCOUNTER — Other Ambulatory Visit (HOSPITAL_COMMUNITY): Payer: BC Managed Care – PPO | Admitting: Psychiatry

## 2016-03-22 DIAGNOSIS — N644 Mastodynia: Secondary | ICD-10-CM | POA: Diagnosis not present

## 2016-03-22 DIAGNOSIS — I890 Lymphedema, not elsewhere classified: Secondary | ICD-10-CM

## 2016-03-22 DIAGNOSIS — F331 Major depressive disorder, recurrent, moderate: Secondary | ICD-10-CM

## 2016-03-22 DIAGNOSIS — M25512 Pain in left shoulder: Secondary | ICD-10-CM | POA: Diagnosis not present

## 2016-03-22 DIAGNOSIS — M25612 Stiffness of left shoulder, not elsewhere classified: Secondary | ICD-10-CM | POA: Diagnosis not present

## 2016-03-22 NOTE — Therapy (Addendum)
[place], [name], [address] Phone: [phone]   Fax:  [phone]  Physical Therapy Treatment  Patient Details  Name: Kristi [name] MRN: [id number] Date of Birth: 07/24/1956 Referring Provider: Barry Dienes  Encounter Date: 03/22/2016      PT End of Session - 03/22/16 1444    Visit Number 8   Number of Visits 25   Date for PT Re-Evaluation 04/24/16   PT Start Time 6553   PT Stop Time 1438   PT Time Calculation (min) 46 min   Activity Tolerance Patient tolerated treatment well   Behavior During Therapy [facility] for tasks assessed/performed      Past Medical History  Diagnosis Date  . Anxiety   . Hypertension   . MRSA (methicillin resistant Staphylococcus aureus) 2009    right groin area-no issues now. 04-07-14 PCR screen negative today.  . Depression   . Anemia     Iron deficinecy anemia  . History of radiation therapy 09/09/13-10/28/13    45 gray to left breast, lumpectomy cavity boosted to 63 gray  . Neuropathy ([place])   . Arthritis   . History of chicken pox   . Breast cancer ([place])     left ,last radiation 2'15, last chemo 8'14  . Anxiety and depression 05/15/2014  . Hyperglycemia 01/09/2016    Past Surgical History  Procedure Laterality Date  . Anal sphincterotomy  04/2011  . Hemorrhoid surgery  04/2011    ligation  . Breast lumpectomy with needle localization Left 02/04/2013    Procedure: LEFT BREAST LUMPECTOMY WITH NEEDLE LOCALIZATION;  Surgeon: Stark Klein, MD;  Location: [place];  Service: General;  Laterality: Left;  . Axillary lymph node dissection Left 02/04/2013    Procedure: LEFT AXILLARY LYMPH NODE DISSECTION;  Surgeon: Stark Klein, MD;  Location: [place];  Service: General;  Laterality: Left;  End: 7482  . Portacath placement Right 02/04/2013    Procedure: INSERTION PORT-A-CATH;  Surgeon: Stark Klein, MD;  Location: [place];  Service: General;  Laterality: Right;  Start Time: 7078.  Marland Kitchen  Appendectomy  1980  . Breast surgery      Lumpectomy in april 2014  . Shoulder arthroscopy with rotator cuff repair and subacromial decompression Left 02/24/2014    Procedure: SHOULDER ARTHROSCOPY WITH ROTATOR CUFF REPAIR AND SUBACROMIAL DECOMPRESSION;  Surgeon: Meredith Pel, MD;  Location: [place];  Service: Orthopedics;  Laterality: Left;  LEFT SHOULDER DIAGNOSTIC OPERATIVE ARTHROSCOPY, SUBACROMIAL DECOMPRESSION, ROTATOR CUFF TEAR REPAIR  . Port-a-cath removal N/A 04/16/2014    Procedure: REMOVAL PORT-A-CATH;  Surgeon: Stark Klein, MD;  [facility];  Service: General;  Laterality: N/A;    There were no vitals filed for this visit.      Subjective Assessment - 03/22/16 1404    Subjective I heard from [employer] and she is going to talk me through a new setting on the machine for me to try. I'll call them in the next day or 2.    Pertinent History Left lumpectomy and axillary lymph node dissection 02/04/13 followed by chemotherapy and radiation to left breast and axilla. She has struggled significantly with bilateral feet neuropathy, anxiety and depression.   Patient Stated Goals to have my breasts feel some sort of normal   Currently in Pain? No/denies            [facility] PT Assessment - 03/22/16 0001    AROM   Left Shoulder Flexion 145 Degrees   Left Shoulder ABduction 160  Degrees  Pt able to do with less assist for correct technique today                     [facility] Adult PT Treatment/Exercise - 03/22/16 0001    Shoulder Exercises: Standing   Other Standing Exercises Standing against wall 3 way raises into flexion, scaption, and abduction to 90 degrees each with 2 lbs, x 10 each way.   Shoulder Exercises: Pulleys   Flexion 2 minutes   ABduction 2 minutes   Shoulder Exercises: Therapy Ball   Flexion 10 reps   ABduction 10 reps   Manual Therapy   Manual Lymphatic Drainage (MLD) In supine: short neck, superficial abdomen, right axilla and anterior  interaxillary anastomosis, left groin and axillo-inguinal anastomosis, and breast, directing towards pathways; also left upper arm. Continued in Rt S/L for posterior inter-axillary anastomosis and Lt axillo-inguinal anastomosis focusing on lateral breast.                   Short Term Clinic Goals - 03/15/16 1346    CC Short Term Goal  #1   Title Pt to be able to verbalize lymphedema risk reduction practices   Status Achieved   CC Short Term Goal  #2   Title Pt to report decreased pain of less than 3/10 to improve comfort and allow increased use of LUE  Pt reports "it hasn't really bothered me much the last few days".    Status Achieved             Long Term Clinic Goals - 03/22/16 1504    CC Long Term Goal  #1   Title Pt to demonstrate 155 degrees of left shoulder flexion to allow pt to reach items overhead   Baseline 140, 160 degrees with assist from therapist to decrease shoulder hike 03/15/16, 145 degrees 03/22/16   Status Partially Met   CC Long Term Goal  #2   Title Pt to have appropriate compression bra for long term management of edema  Pt continues to be encouraged to go to [facility] with current compression bra so they can troubleshoot with her.   Status On-going   CC Long Term Goal  #3   Title Pt will report a 50% decrease in breast lymphedema symptoms   Status On-going   CC Long Term Goal  #4   Title Pt will report decreased pain of less than 2/10 to allow improved use of LUE   Status Achieved   CC Long Term Goal  #5   Title Pt will have an improved LLIS impairment score of less than 30% to allow improved use of LUE   Status On-going            Plan - 03/22/16 1454    Clinical Impression Statement Pt seemed in better spirits today and reported she had heard from [place] about trying a new setting on her compression pump and planned on calling them this afternoon when she got home. Her A/ROM had improved slightly from last weeks measurements.  Reinforced with pt importance of her taking her current compression bra (which she was again not wearing today)  to [facility] [place] so they can see it on her and see if there might be a different fitting bra for her body type. Pt reports she will do this soon.    Rehab Potential Good   Clinical Impairments Affecting Rehab Potential none   PT Frequency 3x / week  PT Duration 8 weeks   PT Treatment/Interventions Therapeutic exercise;Manual lymph drainage;ADLs/Self Care Home Management   PT Next Visit Plan Check her current compression bra and work with patient to find one that will not be uncomfortable for her. Cont to focus on ROM to Lt UE and progressing HEP. Manual lymph drainage at end of session.  Assess compliance of Maintenance Phase of treatment.    Consulted and Agree with Plan of Care Patient      Patient will benefit from skilled therapeutic intervention in order to improve the following deficits and impairments:  Pain, Impaired UE functional use, Decreased strength, Increased edema  Visit Diagnosis: Lymphedema, not elsewhere classified - Plan: PT plan of care cert/re-cert  Stiffness of left shoulder, not elsewhere classified - Plan: PT plan of care cert/re-cert  Pain in left shoulder - Plan: PT plan of care cert/re-cert     Problem List Patient Active Problem List   Diagnosis Date Noted  . Generalized anxiety disorder 03/14/2016    Class: Chronic  . Hyperglycemia 01/09/2016  . Chemotherapy-induced peripheral neuropathy ([place]) 09/21/2015  . History of chicken pox   . Shingles 05/15/2014  . Anxiety and depression 05/15/2014  . Rotator cuff tear 02/24/2014  . Hot flashes due to tamoxifen 02/20/2014  . Left shoulder pain with abduction 12/12/2013  . CVA (cerebral infarction) 07/18/2013  . TIA (transient ischemic attack) 07/18/2013  . Contracture of axilla 05/20/2013  . Primary cancer of upper inner quadrant of left female breast ([place]) 01/24/2013  . Anal fissure  05/03/2011  . Hemorrhoids, internal, with prolapse 05/03/2011    SALISBURY,DONNA, PTA 03/22/2016, 9:00 PM  [place], [name], [address] Phone: [phone]   Fax:  [phone]  Name: Kristi [name] MRN: [id number] Date of Birth: 1956/08/16    Serafina Royals, PT 03/22/2016 9:00 PM

## 2016-03-22 NOTE — Progress Notes (Signed)
    Daily Group Progress Note  Program: IOP  Group Time: 9:00-10:30  Participation Level: Minimal  Behavioral Response: Appropriate  Type of Therapy:  Group Therapy  Summary of Progress: Pt. Continues to present with primarily flat affect, depressed. Pt. Reported that she felt "ok". She stated that she was feeling somewhat better but that she was worried about a friend who has terminal stomach cancer and has a mammogram scheduled for today.      Group Time: 10:30-12:00  Participation Level:  Active  Behavioral Response: Appropriate  Type of Therapy: Psycho-education Group  Summary of Progress: Pt. Participated in yoga therapy with Jan Fireman, LPC.  Nancie Neas, LPC

## 2016-03-22 NOTE — Addendum Note (Signed)
Addended by: Jomarie Longs on: 03/22/2016 09:00 PM   Modules accepted: Orders

## 2016-03-23 ENCOUNTER — Other Ambulatory Visit (HOSPITAL_COMMUNITY): Payer: BC Managed Care – PPO | Admitting: Psychiatry

## 2016-03-23 DIAGNOSIS — F331 Major depressive disorder, recurrent, moderate: Secondary | ICD-10-CM | POA: Diagnosis not present

## 2016-03-24 ENCOUNTER — Other Ambulatory Visit (HOSPITAL_COMMUNITY): Payer: BC Managed Care – PPO | Admitting: Psychiatry

## 2016-03-24 ENCOUNTER — Encounter: Payer: Medicare Other | Admitting: Physical Therapy

## 2016-03-24 DIAGNOSIS — F331 Major depressive disorder, recurrent, moderate: Secondary | ICD-10-CM

## 2016-03-24 NOTE — Progress Notes (Signed)
    Daily Group Progress Note  Program: IOP  Group Time: 9:00-12:00  Participation Level: Active  Behavioral Response: Appropriate  Type of Therapy:  Group Therapy  Summary of Progress: Pt. Presents as quiet, alert, engaged in the group process. Pt. Participated in conversation about the importance of social support in the recovery process.      Nancie Neas, LPC

## 2016-03-27 ENCOUNTER — Other Ambulatory Visit (HOSPITAL_COMMUNITY): Payer: BC Managed Care – PPO | Admitting: Licensed Clinical Social Worker

## 2016-03-27 DIAGNOSIS — F411 Generalized anxiety disorder: Secondary | ICD-10-CM

## 2016-03-27 DIAGNOSIS — F329 Major depressive disorder, single episode, unspecified: Secondary | ICD-10-CM | POA: Diagnosis not present

## 2016-03-27 DIAGNOSIS — F331 Major depressive disorder, recurrent, moderate: Secondary | ICD-10-CM

## 2016-03-27 NOTE — Progress Notes (Signed)
   THERAPIST PROGRESS NOTE  Session Time: 9-12  Participation Level: Minimal  Behavioral Response: Appropriate  Type of Therapy: Psychoeducation/Group Therapy  Interventions: First part of group was facilitated by [facility] pharmacist on medication management.  Summary: Pt presented with flat affect. She participated minimally. She participated in a discussion on learning to deal with everyday disappointments even when depressed.        MACKENZIE,LISBETH S, Licensed Cli [phone]

## 2016-03-28 ENCOUNTER — Other Ambulatory Visit (HOSPITAL_COMMUNITY): Payer: BC Managed Care – PPO | Admitting: Licensed Clinical Social Worker

## 2016-03-28 DIAGNOSIS — F331 Major depressive disorder, recurrent, moderate: Secondary | ICD-10-CM

## 2016-03-28 DIAGNOSIS — F411 Generalized anxiety disorder: Secondary | ICD-10-CM

## 2016-03-28 NOTE — Progress Notes (Signed)
    Daily Group Progress Note  Program: IOP  Group Time: 10:45-12:00  Participation Level: Minimal  Behavioral Response: Appropriate  Type of Therapy:  Psycho-education Group  Summary of Progress: Pt participated in a discussion on "What does my depression personally look like." Pt participated in activity on identifying personal depression symptoms. Pt shared all her depressive symptoms tearfully. Pt presents daily with depressed affect.         Giovanie Lefebre S, Licensed Cli

## 2016-03-28 NOTE — Progress Notes (Signed)
    Daily Group Progress Note  Program: IOP  Group Time: 9:00-10:30  Participation Level: Active  Behavioral Response: Appropriate  Type of Therapy:  Group Therapy  Summary of Progress: Pt. Continues to present as quiet, lethargic, flat affect, depressed. Pt. Shared that alcohol abuse has affected her family, and that she provides transportation and care for her brother who is alcohol dependent.      Group Time: 10:30-12:00  Participation Level:  Active  Behavioral Response: Appropriate  Type of Therapy: Psycho-education Group  Summary of Progress: Pt. Participated in grief and loss with the Chaplain.  Nancie Neas, LPC

## 2016-03-29 ENCOUNTER — Other Ambulatory Visit (HOSPITAL_COMMUNITY): Payer: BC Managed Care – PPO | Admitting: Psychiatry

## 2016-03-29 ENCOUNTER — Ambulatory Visit: Payer: BC Managed Care – PPO

## 2016-03-29 DIAGNOSIS — M25612 Stiffness of left shoulder, not elsewhere classified: Secondary | ICD-10-CM | POA: Diagnosis not present

## 2016-03-29 DIAGNOSIS — I890 Lymphedema, not elsewhere classified: Secondary | ICD-10-CM | POA: Diagnosis not present

## 2016-03-29 DIAGNOSIS — M25512 Pain in left shoulder: Secondary | ICD-10-CM | POA: Diagnosis not present

## 2016-03-29 DIAGNOSIS — N644 Mastodynia: Secondary | ICD-10-CM | POA: Diagnosis not present

## 2016-03-29 DIAGNOSIS — F331 Major depressive disorder, recurrent, moderate: Secondary | ICD-10-CM | POA: Diagnosis not present

## 2016-03-29 NOTE — Progress Notes (Signed)
    Daily Group Progress Note  Program: IOP  Group Time: 9:00-10;30  Participation Level: Minimal  Behavioral Response: Appropriate  Type of Therapy:  Group Therapy  Summary of Progress: Pt. Presented as quiet, reserved, primarily flat affect, depressed. Pt. Reported to the group that she was "ok". Pt. Reported that she slept well last night and generally sleep was not a problem for her.

## 2016-03-29 NOTE — Therapy (Signed)
[place], [name], [address] Phone: [phone]   Fax:  [phone]  Physical Therapy Treatment  Patient Details  Name: Kristi [name] MRN: [id number] Date of Birth: [birth year] Referring Provider: Barry Dienes  Encounter Date: 03/29/2016      PT End of Session - 03/29/16 1350    Visit Number 9   Number of Visits 25   Date for PT Re-Evaluation 04/24/16   PT Start Time 6644   PT Stop Time 1346   PT Time Calculation (min) 43 min   Activity Tolerance Patient tolerated treatment well   Behavior During Therapy [facility] for tasks assessed/performed      Past Medical History  Diagnosis Date  . Anxiety   . Hypertension   . MRSA (methicillin resistant Staphylococcus aureus) 2009    right groin area-no issues now. 04-07-14 PCR screen negative today.  . Depression   . Anemia     Iron deficinecy anemia  . History of radiation therapy 09/09/13-10/28/13    45 gray to left breast, lumpectomy cavity boosted to 63 gray  . Neuropathy ([place])   . Arthritis   . History of chicken pox   . Breast cancer ([place])     left ,last radiation 2'15, last chemo 8'14  . Anxiety and depression 05/15/2014  . Hyperglycemia 01/09/2016    Past Surgical History  Procedure Laterality Date  . Anal sphincterotomy  04/2011  . Hemorrhoid surgery  04/2011    ligation  . Breast lumpectomy with needle localization Left 02/04/2013    Procedure: LEFT BREAST LUMPECTOMY WITH NEEDLE LOCALIZATION;  Surgeon: Stark Klein, MD;  Location: [place];  Service: General;  Laterality: Left;  . Axillary lymph node dissection Left 02/04/2013    Procedure: LEFT AXILLARY LYMPH NODE DISSECTION;  Surgeon: Stark Klein, MD;  Location: [place];  Service: General;  Laterality: Left;  End: 0347  . Portacath placement Right 02/04/2013    Procedure: INSERTION PORT-A-CATH;  Surgeon: Stark Klein, MD;  Location: [place];  Service: General;  Laterality: Right;  Start Time: 4259.  Marland Kitchen  Appendectomy  1980  . Breast surgery      Lumpectomy in april 2014  . Shoulder arthroscopy with rotator cuff repair and subacromial decompression Left 02/24/2014    Procedure: SHOULDER ARTHROSCOPY WITH ROTATOR CUFF REPAIR AND SUBACROMIAL DECOMPRESSION;  Surgeon: Meredith Pel, MD;  Location: [place];  Service: Orthopedics;  Laterality: Left;  LEFT SHOULDER DIAGNOSTIC OPERATIVE ARTHROSCOPY, SUBACROMIAL DECOMPRESSION, ROTATOR CUFF TEAR REPAIR  . Port-a-cath removal N/A 04/16/2014    Procedure: REMOVAL PORT-A-CATH;  Surgeon: Stark Klein, MD;  [facility];  Service: General;  Laterality: N/A;    There were no vitals filed for this visit.      Subjective Assessment - 03/29/16 1306    Subjective I got the new setting figured out on my Flexitouch. I'm doing the regular long session once a day, and then the new short one also once a day totaling 2x/day and I feel like my breast is softer today, not having any pain. I haven't gotten to go to [facility] [place] yet for them to check my bra. Was going the other day but the rain was too bad. I'm going to contact [facility] about talking with someone after I complete my current program which ends Friday. Some days I think it helped.    Pertinent History Left lumpectomy and axillary lymph node dissection 02/04/13 followed by chemotherapy and radiation to left breast and axilla. She  has struggled significantly with bilateral feet neuropathy, anxiety and depression.   Patient Stated Goals to have my breasts feel some sort of normal   Currently in Pain? No/denies                         [facility] Adult PT Treatment/Exercise - 03/29/16 0001    Shoulder Exercises: Standing   Other Standing Exercises Standing with back against wall for 3 way raises (into flexion, scaption and abduction to 90 degrees) x 10 each with 3 lbs    Shoulder Exercises: Pulleys   Flexion 2 minutes   ABduction 2 minutes   Shoulder Exercises: Therapy Ball   Flexion 10 reps   With forward lean at top of stretch   ABduction 10 reps  Lt UE with lean into stretch at top   Shoulder Exercises: ROM/Strengthening   Wall Pushups 10 reps   Pushups Limitations Pt returned therapist demonstration   "W" Arms 2 x 5 but had to do with elbows extended due to shoulder limitations   Other ROM/Strengthening Exercises Doorway stretch x 3, 10 second holds.   Manual Therapy   Myofascial Release To Lt chest wall (where pt c/o feeling tightness, especially when driving where seat belt rubs) and axilla with arm in abduction with cross hands technique vertically and horizontally, then with UE pulling into abduction                   Short Term Clinic Goals - 03/15/16 1346    CC Short Term Goal  #1   Title Pt to be able to verbalize lymphedema risk reduction practices   Status Achieved   CC Short Term Goal  #2   Title Pt to report decreased pain of less than 3/10 to improve comfort and allow increased use of LUE  Pt reports "it hasn't really bothered me much the last few days".    Status Achieved             Long Term Clinic Goals - 03/22/16 1504    CC Long Term Goal  #1   Title Pt to demonstrate 155 degrees of left shoulder flexion to allow pt to reach items overhead   Baseline 140, 160 degrees with assist from therapist to decrease shoulder hike 03/15/16, 145 degrees 03/22/16   Status Partially Met   CC Long Term Goal  #2   Title Pt to have appropriate compression bra for long term management of edema  Pt continues to be encouraged to go to [facility] with current compression bra so they can troubleshoot with her.   Status On-going   CC Long Term Goal  #3   Title Pt will report a 50% decrease in breast lymphedema symptoms   Status On-going   CC Long Term Goal  #4   Title Pt will report decreased pain of less than 2/10 to allow improved use of LUE   Status Achieved   CC Long Term Goal  #5   Title Pt will have an improved LLIS impairment score of less  than 30% to allow improved use of LUE   Status On-going            Plan - 03/29/16 1351    Clinical Impression Statement Increased weights with some of pts UE exercises which she reported being challenging but doable. Her breast was palpably softer today so focused on myofascial release to Rt axilla. She has been seeing benefit from increasing  use of her compression pump to 2x/day.    Rehab Potential Good   Clinical Impairments Affecting Rehab Potential none   PT Frequency 3x / week   PT Duration 8 weeks   PT Treatment/Interventions Therapeutic exercise;Manual lymph drainage;ADLs/Self Care Home Management   PT Next Visit Plan Assess her goals. Check her current compression bra and work with patient to find one that will not be uncomfortable for her. Cont to focus on ROM to Lt UE and progressing HEP. Manual lymph drainage at end of session.  Assess compliance of Maintenance Phase of treatment.    Consulted and Agree with Plan of Care Patient      Patient will benefit from skilled therapeutic intervention in order to improve the following deficits and impairments:  Pain, Impaired UE functional use, Decreased strength, Increased edema  Visit Diagnosis: Lymphedema, not elsewhere classified  Stiffness of left shoulder, not elsewhere classified  Pain in left shoulder     Problem List Patient Active Problem List   Diagnosis Date Noted  . Generalized anxiety disorder 03/14/2016    Class: Chronic  . Hyperglycemia 01/09/2016  . Chemotherapy-induced peripheral neuropathy ([place]) 09/21/2015  . History of chicken pox   . Shingles 05/15/2014  . Anxiety and depression 05/15/2014  . Rotator cuff tear 02/24/2014  . Hot flashes due to tamoxifen 02/20/2014  . Left shoulder pain with abduction 12/12/2013  . CVA (cerebral infarction) 07/18/2013  . TIA (transient ischemic attack) 07/18/2013  . Contracture of axilla 05/20/2013  . Primary cancer of upper inner quadrant of left female breast  ([place]) 01/24/2013  . Anal fissure 05/03/2011  . Hemorrhoids, internal, with prolapse 05/03/2011    Otelia Limes, PTA 03/29/2016, 1:57 PM  [place], [name], [address] Phone: [phone]   Fax:  [phone]  Name: Kristi [name] MRN: [id number] Date of Birth: July 10, 1956

## 2016-03-30 ENCOUNTER — Other Ambulatory Visit (HOSPITAL_COMMUNITY): Payer: BC Managed Care – PPO | Admitting: Psychiatry

## 2016-03-30 DIAGNOSIS — F331 Major depressive disorder, recurrent, moderate: Secondary | ICD-10-CM | POA: Diagnosis not present

## 2016-03-30 NOTE — Progress Notes (Signed)
Patient ID: SHELLINA KOLMAN, female   DOB: 1956/10/28, 60 y.o.   MRN: [id number] Discharge Note  Patient:  Kristi [name] is an 60 y.o., female DOB:  1956/04/12  Date of Admission:  03/13/2016  Date of Discharge:  03/31/2016  Reason for Admission:depression  IOP Course:  Attended and participated.  No great change in level of depression though she says she feels some better.  She believes it was helpful to be able to have some structure and people to talk to but still has the chronic pain and inability to work which are both depressing to her.  Still not able to do the things she wants to do.   Her daughter is being more supportive and understanding, her husband tries but just cannot understand what she is experiencing.   Mental Status at Discharge:still depressed but not suicidal  Lab Results: No results found for this or any previous visit (from the past 48 hour(s)).   Current outpatient prescriptions:  .  ALPRAZolam (XANAX) 1 MG tablet, Reported on 12/27/2015, Disp: , Rfl:  .  amitriptyline (ELAVIL) 25 MG tablet, Take 4 tablets (100 mg total) by mouth at bedtime., Disp: 30 tablet, Rfl: 1 .  anastrozole (ARIMIDEX) 1 MG tablet, Take 1 tablet (1 mg total) by mouth daily., Disp: 90 tablet, Rfl: 3 .  Ascorbic Acid (VITAMIN C) 1000 MG tablet, Take 1,000 mg by mouth daily., Disp: , Rfl:  .  aspirin EC 81 MG tablet, Take 162 mg by mouth daily., Disp: , Rfl:  .  B Complex-C (SUPER B COMPLEX PO), Take 1 tablet by mouth daily., Disp: , Rfl:  .  clonazePAM (KLONOPIN) 1 MG tablet, Take 1 tablet (1 mg total) by mouth 3 (three) times daily., Disp: 90 tablet, Rfl: 0 .  Eszopiclone (ESZOPICLONE) 3 MG TABS, Take 3 mg by mouth at bedtime. Take immediately before bedtime, Disp: , Rfl:  .  gabapentin (NEURONTIN) 800 MG tablet, Take 1 tablet (800 mg total) by mouth 3 (three) times daily., Disp: 90 tablet, Rfl: 2 .  KLOR-CON M20 20 MEQ tablet, TAKE 2 TABLETS FOR 3 DAYS, THEN TAKE ONCE A DAY, Disp: 35 tablet, Rfl:  1 .  Multiple Vitamin (MULTIVITAMIN WITH MINERALS) TABS tablet, Take 1 tablet by mouth daily., Disp: , Rfl:  .  naproxen (NAPROSYN) 500 MG tablet, Take 1 tablet (500 mg total) by mouth 2 (two) times daily. (Patient not taking: Reported on 03/09/2016), Disp: 10 tablet, Rfl: 0 .  valsartan-hydrochlorothiazide (DIOVAN-HCT) 320-25 MG tablet, TAKE 1 TABLET BY MOUTH EVERY MORNING., Disp: 30 tablet, Rfl: 1 .  [DISCONTINUED] prochlorperazine (COMPAZINE) 10 MG tablet, Take 1 tablet (10 mg total) by mouth every 6 (six) hours as needed (Nausea or vomiting)., Disp: 30 tablet, Rfl: 1 .  [DISCONTINUED] prochlorperazine (COMPAZINE) 25 MG suppository, Place 1 suppository (25 mg total) rectally every 12 (twelve) hours as needed for nausea., Disp: 12 suppository, Rfl: 3  Axis Diagnosis:  Major depression, recurrent moderate   Level of Care:  IOP  Discharge destination:  Other:  has appointment with therapist and psychiatrist.  Is patient on multiple antipsychotic therapies at discharge:  No    Has Patient had three or more failed trials of antipsychotic monotherapy by history:  Negative  Patient phone:  [phone] (home)  Patient address:   [address] [place] [address],   Follow-up recommendations:  Activity:  continue current activity Diet:  continue current diet  Comments:  none  The patient received suicide prevention pamphlet:  yes   Donnelly Angelica 03/30/2016, 12:35 PM

## 2016-03-30 NOTE — Progress Notes (Signed)
    Daily Group Progress Note  Program: IOP  Group Time: 9:00-12:00  Participation Level: Active  Behavioral Response: Appropriate  Type of Therapy:  Group Therapy  Summary of Progress: Pt. Continues to present with primarily flat affect, depressed. Pt. Participated in guided meditation exercise. Pt. Shared with the group that she is unhappy with her home because she is unable to get comfortable for her edema treatments and that climbing stairs is painful due to her neuropathy. Pt. Would like to move, but her husband does not want to move.      Nancie Neas, LPC

## 2016-03-31 ENCOUNTER — Other Ambulatory Visit (HOSPITAL_COMMUNITY): Payer: BC Managed Care – PPO | Admitting: Psychiatry

## 2016-03-31 DIAGNOSIS — F331 Major depressive disorder, recurrent, moderate: Secondary | ICD-10-CM

## 2016-03-31 NOTE — Progress Notes (Signed)
    Daily Group Progress Note  Program: IOP  Group Time: 9:00-12:00  Participation Level: Active  Behavioral Response: Appropriate  Type of Therapy:  Group Therapy    Summary of Progress: Client addressed her guilt about her mental illness.  She feels she has a lot to be grateful for while others have "more serious" struggles to handle.  Client was encouraged to continue to remind herself of the positive aspects of life.    Eloise Levels, Ph.D., [facility]

## 2016-03-31 NOTE — Progress Notes (Signed)
Kristi [name] is a 60 y.o. , married, disabled, African American woman, who was referred per Jill Poling, NP; treatment for worsening depressive and anxiety symptoms. Pt c/o daily panic attacks. Denied SI/HI or A/V hallucinations. Reported symptoms started worsening about three months ago. Triggers: 1) Brother developed a skin rash and pt was transporting him to various doctor's appts. Pt stated the rash has gone away, but the "not knowing" was very difficult. Brother is an alcoholic. 2) Unresolved grief/loss issues: Pt has hx of breast cancer in 2016. States the cancer has left her with Lymphedema in her breasts. Pt also has Neuropathy. 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 She passed in 2013. Pt stated this is when the depression set in and she started seeing Dr. Virgina Evener for medication. Denied any prior psychiatric hospitalizations. Denied prior suicide attempts or gestures. Has been seeing Metta Clines, NP for six months. Has been seeing Monique Crutchfield, LCSW for 1 1/2 yrs. Family Hx: Brother (ETOH). Pt completed MH-IOP today.  Reports overall feeling much better.  Continues to struggle with anxiety at times.  Reports decreased tearfulness and feeling less depressed.  Pt's affect is brighter.  Continues to deny SI/HI or A/V hallucinations.  A:  D/C today.  F/U with Jill Poling, NP on 04-06-16 @ 10:30 am and Monique Crutchfield, LCSW on 04-10-16 a.m..  Encouraged support groups.  Strongly recommended pt to f/u with [facility].  R:  Pt receptive.        Carlis Abbott, RITA, M.Ed, CNA

## 2016-03-31 NOTE — Progress Notes (Signed)
    Daily Group Progress Note  Program: IOP  Group Time: 9:00-12:00  Participation Level: Active  Behavioral Response: Appropriate  Type of Therapy:  Group Therapy  Summary of Progress: Pt. Prepared for discharge. Pt. Received feedback from the group regarding being a kind, patient, and thoughtful presence. Pt. Participated in discussion about grief and loss and reflected on losses of her parents. Pt. Also expressed gratitude that she was able to experience after being with a friend who is experiencing cancer diagnosis and treatments.   Eloise Levels, Ph.D., [facility]

## 2016-03-31 NOTE — Patient Instructions (Signed)
Patient completed MH-IOP today.  Will follow up with Metta Clines, NP on 04-06-16 @ 10:30 a.m and Monique Crutchfield, LCSW on 04-10-16 a.m.  Encouraged support groups.

## 2016-04-05 ENCOUNTER — Ambulatory Visit: Payer: BC Managed Care – PPO | Admitting: Physical Therapy

## 2016-04-05 DIAGNOSIS — I890 Lymphedema, not elsewhere classified: Secondary | ICD-10-CM | POA: Diagnosis not present

## 2016-04-05 DIAGNOSIS — M25612 Stiffness of left shoulder, not elsewhere classified: Secondary | ICD-10-CM

## 2016-04-05 DIAGNOSIS — M25512 Pain in left shoulder: Secondary | ICD-10-CM

## 2016-04-05 DIAGNOSIS — N644 Mastodynia: Secondary | ICD-10-CM | POA: Diagnosis not present

## 2016-04-05 NOTE — Therapy (Signed)
[place], [name], [address] Phone: [phone]   Fax:  [phone]  Physical Therapy Treatment  Patient Details  Name: Kristi [name] MRN: [id number] Date of Birth: 1956/01/30 Referring Provider: Barry Dienes  Encounter Date: 04/05/2016      PT End of Session - 04/05/16 1719    Visit Number 10   Number of Visits 25   Date for PT Re-Evaluation 04/24/16   PT Start Time 1524   PT Stop Time 1610   PT Time Calculation (min) 46 min   Activity Tolerance Patient tolerated treatment well   Behavior During Therapy [facility] for tasks assessed/performed      Past Medical History  Diagnosis Date  . Anxiety   . Hypertension   . MRSA (methicillin resistant Staphylococcus aureus) 2009    right groin area-no issues now. 04-07-14 PCR screen negative today.  . Depression   . Anemia     Iron deficinecy anemia  . History of radiation therapy 09/09/13-10/28/13    45 gray to left breast, lumpectomy cavity boosted to 63 gray  . Neuropathy ([place])   . Arthritis   . History of chicken pox   . Breast cancer ([place])     left ,last radiation 2'15, last chemo 8'14  . Anxiety and depression 05/15/2014  . Hyperglycemia 01/09/2016    Past Surgical History  Procedure Laterality Date  . Anal sphincterotomy  04/2011  . Hemorrhoid surgery  04/2011    ligation  . Breast lumpectomy with needle localization Left 02/04/2013    Procedure: LEFT BREAST LUMPECTOMY WITH NEEDLE LOCALIZATION;  Surgeon: Stark Klein, MD;  Location: [place];  Service: General;  Laterality: Left;  . Axillary lymph node dissection Left 02/04/2013    Procedure: LEFT AXILLARY LYMPH NODE DISSECTION;  Surgeon: Stark Klein, MD;  Location: [place];  Service: General;  Laterality: Left;  End: [id number]  . Portacath placement Right 02/04/2013    Procedure: INSERTION PORT-A-CATH;  Surgeon: Stark Klein, MD;  Location: [place];  Service: General;  Laterality: Right;  Start [id number].  Marland Kitchen  Appendectomy  1980  . Breast surgery      Lumpectomy in april 2014  . Shoulder arthroscopy with rotator cuff repair and subacromial decompression Left 02/24/2014    Procedure: SHOULDER ARTHROSCOPY WITH ROTATOR CUFF REPAIR AND SUBACROMIAL DECOMPRESSION;  Surgeon: Meredith Pel, MD;  Location: [place];  Service: Orthopedics;  Laterality: Left;  LEFT SHOULDER DIAGNOSTIC OPERATIVE ARTHROSCOPY, SUBACROMIAL DECOMPRESSION, ROTATOR CUFF TEAR REPAIR  . Port-a-cath removal N/A 04/16/2014    Procedure: REMOVAL PORT-A-CATH;  Surgeon: Stark Klein, MD;  [facility];  Service: General;  Laterality: N/A;    There were no vitals filed for this visit.      Subjective Assessment - 04/05/16 1526    Subjective "LIttle anxiety today; just not a good day."  Woke up that way; was just in tears. Sees the doctor tomorrow and will talk to her about the medication. Rode to [place] this weekend and wonders if that aggravated the upper arm.   Currently in Pain? Yes   Pain Score 2    Pain Location Breast  to left axilla; today, also left upper arm   Pain Orientation Left   Pain Descriptors / Indicators Sore   Aggravating Factors  pressure on it   Pain Relieving Factors it's always there, but feels better after taking bra off, having a shower, and putting lotion on it  [facility] Adult PT Treatment/Exercise - 04/05/16 0001    Manual Therapy   Myofascial Release Left UE myofascial pulling, release at left axilla in supine and the crosshands at left side in right sidelying.; in right sidelying, pulling on left UE with concurrent distraction in opposite direction at left flank for stretch across axilla.   Manual Lymphatic Drainage (MLD) In supine, diaphragmatic breathing, then short neck, right axilla and anterior interaxillary anastomosis, left groin and axillo-inguinal anastomosis, left UE from wrist to shoulder, and left breast, directing toward pathways   Passive ROM To left  shoulder in supine for ER, abduction, and flexion with gentle stretch                   Short Term Clinic Goals - 03/15/16 1346    CC Short Term Goal  #1   Title Pt to be able to verbalize lymphedema risk reduction practices   Status Achieved   CC Short Term Goal  #2   Title Pt to report decreased pain of less than 3/10 to improve comfort and allow increased use of LUE  Pt reports "it hasn't really bothered me much the last few days".    Status Achieved             Long Term Clinic Goals - 04/05/16 1538    CC Long Term Goal  #3   Title Pt will report a 50% decrease in breast lymphedema symptoms   Baseline Maybe 30% better on 04/05/16            Plan - 04/05/16 1719    Clinical Impression Statement Patient having a down day today.  Focused on manual therapy, but encouraged patient to work on home exercise (walking or stationary biking).  She still hasn't gotten to be fitted for a new compression bra.   Rehab Potential Good   Clinical Impairments Affecting Rehab Potential none   PT Frequency 3x / week   PT Duration 8 weeks   PT Treatment/Interventions Manual techniques;Passive range of motion;Manual lymph drainage   PT Next Visit Plan Assess her goals. Check her current compression bra and work with patient to find one that will not be uncomfortable for her. Cont to focus on ROM to Lt UE and progressing HEP. Manual lymph drainage at end of session.  Assess compliance of Maintenance Phase of treatment.    Consulted and Agree with Plan of Care Patient      Patient will benefit from skilled therapeutic intervention in order to improve the following deficits and impairments:  Pain, Impaired UE functional use, Decreased strength, Increased edema  Visit Diagnosis: Lymphedema, not elsewhere classified  Stiffness of left shoulder, not elsewhere classified  Pain in left shoulder     Problem List Patient Active Problem List   Diagnosis Date Noted  . Generalized  anxiety disorder 03/14/2016    Class: Chronic  . Hyperglycemia 01/09/2016  . Chemotherapy-induced peripheral neuropathy ([place]) 09/21/2015  . History of chicken pox   . Shingles 05/15/2014  . Anxiety and depression 05/15/2014  . Rotator cuff tear 02/24/2014  . Hot flashes due to tamoxifen 02/20/2014  . Left shoulder pain with abduction 12/12/2013  . CVA (cerebral infarction) 07/18/2013  . TIA (transient ischemic attack) 07/18/2013  . Contracture of axilla 05/20/2013  . Primary cancer of upper inner quadrant of left female breast ([place]) 01/24/2013  . Anal fissure 05/03/2011  . Hemorrhoids, internal, with prolapse 05/03/2011    SALISBURY,DONNA 04/05/2016, 5:24 PM  [facility]  [place], [name], [address] Phone: [phone]   Fax:  [phone]  Name: Kristi [name] MRN: [id number] Date of Birth: 1956/04/05    Serafina Royals, PT 04/05/2016 5:24 PM

## 2016-04-12 ENCOUNTER — Ambulatory Visit: Payer: BC Managed Care – PPO | Attending: General Surgery

## 2016-04-12 DIAGNOSIS — M25612 Stiffness of left shoulder, not elsewhere classified: Secondary | ICD-10-CM | POA: Diagnosis not present

## 2016-04-12 DIAGNOSIS — M25512 Pain in left shoulder: Secondary | ICD-10-CM | POA: Diagnosis not present

## 2016-04-12 DIAGNOSIS — I890 Lymphedema, not elsewhere classified: Secondary | ICD-10-CM | POA: Insufficient documentation

## 2016-04-12 NOTE — Patient Instructions (Addendum)
Over Head Pull: Narrow Grip And Wide Grip    Cancer Rehab [phone]   On back, knees bent, feet flat, band across thighs, elbows straight but relaxed. Pull hands apart (start). Keeping elbows straight, bring arms up and over head, hands toward floor. Keep pull steady on band. Hold momentarily. Return slowly, keeping pull steady, back to start. Then do with a wider grip. Repeat _10__ times. Band color _red (then green later)_____   Side Pull: Double Arm   On back, knees bent, feet flat. Arms perpendicular to body, shoulder level, elbows straight but relaxed. Pull arms out to sides, elbows straight. Resistance band comes across collarbones, hands toward floor. Hold momentarily. Slowly return to starting position. Repeat _10__ times. Band color _red (then green later)____   Sword   On back, knees bent, feet flat, left hand on left hip, right hand above left. Pull right arm DIAGONALLY (hip to shoulder) across chest. Bring right arm along head toward floor. Hold momentarily. Slowly return to starting position. Repeat _10__ times. Do with left arm. Band color _red (then green later)_____   Shoulder Rotation: Double Arm   On back, knees bent, feet flat, elbows tucked at sides, bent 90, hands palms up. Pull hands apart and down toward floor, keeping elbows near sides. Hold momentarily. Slowly return to starting position. Repeat _10__ times. Band color __red (then green later)____

## 2016-04-12 NOTE — Therapy (Signed)
[place], [name], [address] Phone: [phone]   Fax:  [phone]  Physical Therapy Treatment  Patient Details  Name: Kristi [name] MRN: [id number] Date of Birth: May 07, 1956 Referring Provider: Barry Dienes  Encounter Date: 04/12/2016      PT End of Session - 04/12/16 1502    Visit Number 11   Number of Visits 25   Date for PT Re-Evaluation 04/24/16   PT Start Time 4503   PT Stop Time 1520   PT Time Calculation (min) 43 min   Activity Tolerance Patient tolerated treatment well   Behavior During Therapy [facility] for tasks assessed/performed      Past Medical History  Diagnosis Date  . Anxiety   . Hypertension   . MRSA (methicillin resistant Staphylococcus aureus) 2009    right groin area-no issues now. 04-07-14 PCR screen negative today.  . Depression   . Anemia     Iron deficinecy anemia  . History of radiation therapy 09/09/13-10/28/13    45 gray to left breast, lumpectomy cavity boosted to 63 gray  . Neuropathy ([place])   . Arthritis   . History of chicken pox   . Breast cancer ([place])     left ,last radiation 2'15, last chemo 8'14  . Anxiety and depression 05/15/2014  . Hyperglycemia 01/09/2016    Past Surgical History  Procedure Laterality Date  . Anal sphincterotomy  04/2011  . Hemorrhoid surgery  04/2011    ligation  . Breast lumpectomy with needle localization Left 02/04/2013    Procedure: LEFT BREAST LUMPECTOMY WITH NEEDLE LOCALIZATION;  Surgeon: Stark Klein, MD;  Location: [place];  Service: General;  Laterality: Left;  . Axillary lymph node dissection Left 02/04/2013    Procedure: LEFT AXILLARY LYMPH NODE DISSECTION;  Surgeon: Stark Klein, MD;  Location: [place];  Service: General;  Laterality: Left;  End: 8882  . Portacath placement Right 02/04/2013    Procedure: INSERTION PORT-A-CATH;  Surgeon: Stark Klein, MD;  Location: [place];  Service: General;  Laterality: Right;  Start Time: 8003.  Marland Kitchen  Appendectomy  1980  . Breast surgery      Lumpectomy in april 2014  . Shoulder arthroscopy with rotator cuff repair and subacromial decompression Left 02/24/2014    Procedure: SHOULDER ARTHROSCOPY WITH ROTATOR CUFF REPAIR AND SUBACROMIAL DECOMPRESSION;  Surgeon: Meredith Pel, MD;  Location: [place];  Service: Orthopedics;  Laterality: Left;  LEFT SHOULDER DIAGNOSTIC OPERATIVE ARTHROSCOPY, SUBACROMIAL DECOMPRESSION, ROTATOR CUFF TEAR REPAIR  . Port-a-cath removal N/A 04/16/2014    Procedure: REMOVAL PORT-A-CATH;  Surgeon: Stark Klein, MD;  Location: [facility];  Service: General;  Laterality: N/A;    There were no vitals filed for this visit.      Subjective Assessment - 04/12/16 1441    Subjective Went to a support group where a few of the doctors talked, like, Dr. Jana Hakim, and Dr. Barry Dienes who I spoke with further about possible masectomy and she said I needed to just go see her to talk about it further. My Lt breast has been doing better and feelin softer, it's not even hurting me right now.    Pertinent History Left lumpectomy and axillary lymph node dissection 02/04/13 followed by chemotherapy and radiation to left breast and axilla. She has struggled significantly with bilateral feet neuropathy, anxiety and depression.   Patient Stated Goals to have my breasts feel some sort of normal   Currently in Pain? No/denies  [facility] PT Assessment - 04/12/16 0001    AROM   Left Shoulder Flexion 156 Degrees  Reports not feeling any tightness with this   Left Shoulder ABduction 168 Degrees  Pt did with no problems today                     [facility] Adult PT Treatment/Exercise - 04/12/16 0001    Shoulder Exercises: Supine   Horizontal ABduction Strengthening;Both;10 reps;Theraband   Theraband Level (Shoulder Horizontal ABduction) Level 2 (Red)   External Rotation Strengthening;Both;10 reps;Theraband   Theraband Level (Shoulder External Rotation) Level 2 (Red)   Flexion  Strengthening;Both;5 reps;Theraband  Narrow and Wide Grip, 5 each   Theraband Level (Shoulder Flexion) Level 2 (Red)   Other Supine Exercises Bil D2 with red theraband 5 times each UE   Shoulder Exercises: Pulleys   Flexion 2 minutes   ABduction 2 minutes   Shoulder Exercises: Therapy Ball   Flexion 10 reps  With forward lean at top of stretch   ABduction 10 reps  Lt UE with lean into stretch at top                PT Education - 04/12/16 1458    Education provided Yes   Education Details Supine scapular series issued red today (and then green for later use)   Person(s) Educated Patient   Methods Explanation;Demonstration;Handout;Verbal cues   Comprehension Verbalized understanding;Returned demonstration           Short Term Clinic Goals - 03/15/16 1346    CC Short Term Goal  #1   Title Pt to be able to verbalize lymphedema risk reduction practices   Status Achieved   CC Short Term Goal  #2   Title Pt to report decreased pain of less than 3/10 to improve comfort and allow increased use of LUE  Pt reports "it hasn't really bothered me much the last few days".    Status Achieved             Long Term Clinic Goals - 04/12/16 1451    CC Long Term Goal  #1   Title Pt to demonstrate 155 degrees of left shoulder flexion to allow pt to reach items overhead   Baseline 140, 160 degrees with assist from therapist to decrease shoulder hike 03/15/16, 145 degrees 03/22/16, 156 degrees 04/12/16   Status Achieved   CC Long Term Goal  #2   Title Pt to have appropriate compression bra for long term management of edema  Pt knows where to go just has yet to make it a priority.   Status Partially Met   CC Long Term Goal  #3   Title Pt will report a 50% decrease in breast lymphedema symptoms   Baseline Maybe 30% better on 04/05/16, 50% 04/12/16   Status Achieved   CC Long Term Goal  #4   Title Pt will report decreased pain of less than 2/10 to allow improved use of LUE   Baseline  4, 1/10 today and pt reports last few days it "hasn't hurt her much, she hasn't really noticed it (pain)" , been 2/10 or less past few days   Status Achieved   CC Long Term Goal  #5   Title Pt will have an improved LLIS impairment score of less than 30% to allow improved use of LUE   Baseline 61% impairment, 31% limited at this time 04/12/16   Status Partially Met  Plan - 04/12/16 1512    Clinical Impression Statement Patient doing fairly well today. Had gone to a class recently at the [facility] and then a support group last night. Also upon talking with pt about how she feels she has progressed thus far she reports her breast pain is 50% imporved meeting that goal and her A/ROM has imporved as well meeting that goal. Pt is overall proresing very well with therapy. She still just needs to look into getting another better fitting compression bra which she knows to go to [facility] for.    Rehab Potential Good   Clinical Impairments Affecting Rehab Potential none   PT Frequency 3x / week   PT Duration 8 weeks   PT Treatment/Interventions Manual techniques;Passive range of motion;Manual lymph drainage   PT Next Visit Plan Discussed with pt possiblilty of decreasing to 1x/week or possibly D/C next visit as she has made such great progress. Discuss this with pt further at next visit. Review HEP issued today.   PT Home Exercise Plan Supine scapular sereis with red theraband (issued green for later use)   Consulted and Agree with Plan of Care Patient      Patient will benefit from skilled therapeutic intervention in order to improve the following deficits and impairments:  Pain, Impaired UE functional use, Decreased strength, Increased edema  Visit Diagnosis: Lymphedema, not elsewhere classified  Stiffness of left shoulder, not elsewhere classified  Pain in left shoulder     Problem List Patient Active Problem List   Diagnosis Date Noted  . Generalized anxiety  disorder 03/14/2016    Class: Chronic  . Hyperglycemia 01/09/2016  . Chemotherapy-induced peripheral neuropathy ([place]) 09/21/2015  . History of chicken pox   . Shingles 05/15/2014  . Anxiety and depression 05/15/2014  . Rotator cuff tear 02/24/2014  . Hot flashes due to tamoxifen 02/20/2014  . Left shoulder pain with abduction 12/12/2013  . CVA (cerebral infarction) 07/18/2013  . TIA (transient ischemic attack) 07/18/2013  . Contracture of axilla 05/20/2013  . Primary cancer of upper inner quadrant of left female breast ([place]) 01/24/2013  . Anal fissure 05/03/2011  . Hemorrhoids, internal, with prolapse 05/03/2011    Otelia Limes, PTA 04/12/2016, 5:05 PM  [place], [name], [address] Phone: [phone]   Fax:  [phone]  Name: Kristi [name] MRN: [id number] Date of Birth: 19-Oct-1956

## 2016-04-14 ENCOUNTER — Encounter: Payer: Self-pay | Admitting: Physical Therapy

## 2016-04-16 ENCOUNTER — Other Ambulatory Visit: Payer: Self-pay | Admitting: Medical

## 2016-04-17 ENCOUNTER — Ambulatory Visit: Payer: BC Managed Care – PPO | Admitting: Physical Therapy

## 2016-04-19 ENCOUNTER — Ambulatory Visit: Payer: BC Managed Care – PPO

## 2016-04-19 DIAGNOSIS — M25612 Stiffness of left shoulder, not elsewhere classified: Secondary | ICD-10-CM | POA: Diagnosis not present

## 2016-04-19 DIAGNOSIS — M25512 Pain in left shoulder: Secondary | ICD-10-CM | POA: Diagnosis not present

## 2016-04-19 DIAGNOSIS — I890 Lymphedema, not elsewhere classified: Secondary | ICD-10-CM

## 2016-04-19 NOTE — Therapy (Signed)
[place], [name], [address] Phone: [phone]   Fax:  [phone]  Physical Therapy Treatment  Patient Details  Name: Kristi [name] MRN: [id number] Date of Birth: 1956-04-05 Referring Provider: Barry Dienes  Encounter Date: 04/19/2016      PT End of Session - 04/19/16 1533    Visit Number 12   Number of Visits 25   Date for PT Re-Evaluation 04/24/16   PT Start Time 6568   PT Stop Time 1535   PT Time Calculation (min) 39 min   Activity Tolerance Patient tolerated treatment well   Behavior During Therapy [facility] for tasks assessed/performed      Past Medical History  Diagnosis Date  . Anxiety   . Hypertension   . MRSA (methicillin resistant Staphylococcus aureus) 2009    right groin area-no issues now. 04-07-14 PCR screen negative today.  . Depression   . Anemia     Iron deficinecy anemia  . History of radiation therapy 09/09/13-10/28/13    45 gray to left breast, lumpectomy cavity boosted to 63 gray  . Neuropathy ([place])   . Arthritis   . History of chicken pox   . Breast cancer ([place])     left ,last radiation 2'15, last chemo 8'14  . Anxiety and depression 05/15/2014  . Hyperglycemia 01/09/2016    Past Surgical History  Procedure Laterality Date  . Anal sphincterotomy  04/2011  . Hemorrhoid surgery  04/2011    ligation  . Breast lumpectomy with needle localization Left 02/04/2013    Procedure: LEFT BREAST LUMPECTOMY WITH NEEDLE LOCALIZATION;  Surgeon: Stark Klein, MD;  Location: [place];  Service: General;  Laterality: Left;  . Axillary lymph node dissection Left 02/04/2013    Procedure: LEFT AXILLARY LYMPH NODE DISSECTION;  Surgeon: Stark Klein, MD;  Location: [place];  Service: General;  Laterality: Left;  End: 1275  . Portacath placement Right 02/04/2013    Procedure: INSERTION PORT-A-CATH;  Surgeon: Stark Klein, MD;  Location: [place];  Service: General;  Laterality: Right;  Start Time: 1700.  Marland Kitchen  Appendectomy  1980  . Breast surgery      Lumpectomy in april 2014  . Shoulder arthroscopy with rotator cuff repair and subacromial decompression Left 02/24/2014    Procedure: SHOULDER ARTHROSCOPY WITH ROTATOR CUFF REPAIR AND SUBACROMIAL DECOMPRESSION;  Surgeon: Meredith Pel, MD;  Location: [place];  Service: Orthopedics;  Laterality: Left;  LEFT SHOULDER DIAGNOSTIC OPERATIVE ARTHROSCOPY, SUBACROMIAL DECOMPRESSION, ROTATOR CUFF TEAR REPAIR  . Port-a-cath removal N/A 04/16/2014    Procedure: REMOVAL PORT-A-CATH;  Surgeon: Stark Klein, MD;  [facility];  Service: General;  Laterality: N/A;    There were no vitals filed for this visit.      Subjective Assessment - 04/19/16 1359    Subjective I went to [facility] yesterday and got 2 new compression bras and I can tell already they are going to be more comfortable! And the Lt lateral breast swelling continues to do better actually and I didn't get to use my pump yesterday due to my cousins coming into to town and suprising me and the swelling still feels ok today. Going to continue going to the support group  I also signed up for [facility], but it doesn't start until September.    Pertinent History Left lumpectomy and axillary lymph node dissection 02/04/13 followed by chemotherapy and radiation to left breast and axilla. She has struggled significantly with bilateral feet neuropathy, anxiety and depression.  Patient Stated Goals to have my breasts feel some sort of normal   Currently in Pain? No/denies            [facility] PT Assessment - 04/19/16 0001    AROM   Left Shoulder Flexion 150 Degrees  Pt reports this just feeling really tight   Left Shoulder ABduction 174 Degrees  With slight scaption                     [facility] Adult PT Treatment/Exercise - 04/19/16 0001    Shoulder Exercises: Pulleys   Flexion 2 minutes   ABduction 2 minutes   Shoulder Exercises: Therapy Ball   Flexion 10 reps  With forward lean at top  of stretch   ABduction 10 reps  Lt UE with lean into top of stretch   Manual Therapy   Myofascial Release At Lt axilla; and Left UE myofascial pulling, release at left axilla in supine and the crosshands at left side in right sidelying.; in right sidelying, pulling on left UE with concurrent distraction in opposite direction at left flank for stretch across axilla.   Passive ROM To left shoulder in supine for ER, abduction, and flexion with gentle stretch                   Short Term Clinic Goals - 03/15/16 1346    CC Short Term Goal  #1   Title Pt to be able to verbalize lymphedema risk reduction practices   Status Achieved   CC Short Term Goal  #2   Title Pt to report decreased pain of less than 3/10 to improve comfort and allow increased use of LUE  Pt reports "it hasn't really bothered me much the last few days".    Status Achieved             Long Term Clinic Goals - 04/19/16 1542    CC Long Term Goal  #1   Title Pt to demonstrate 155 degrees of left shoulder flexion to allow pt to reach items overhead   Baseline 140, 160 degrees with assist from therapist to decrease shoulder hike 03/15/16, 145 degrees 03/22/16, 156 degrees 04/12/16, 150 degrees 04/19/16   Status On-going   CC Long Term Goal  #2   Title Pt to have appropriate compression bra for long term management of edema   Status Achieved   CC Long Term Goal  #3   Title Pt will report a 50% decrease in breast lymphedema symptoms   Status Achieved   CC Long Term Goal  #4   Title Pt will report decreased pain of less than 2/10 to allow improved use of LUE   Baseline 4, 1/10 today and pt reports last few days it "hasn't hurt her much, she hasn't really noticed it (pain)" , been 2/10 or less past few days, 0-1/10 since last visit 04/19/16   Status Achieved   CC Long Term Goal  #5   Title Pt will have an improved LLIS impairment score of less than 30% to allow improved use of LUE   Status Partially Met             Plan - 04/19/16 1534    Clinical Impression Statement Pt was in better spirits today reporting some of her family had surprised her with a visit yesterday and are going to be in town for a few days. She went to [facility] yesterday and got 2 new bras that she  reports are going to be much more comfortable than the one she has now, although she wasn't wearing it today. Did encourage pt to wear them for the next few days to make sure they are comfortable so if she needs to return them she can do so within the time alloted. Her A/ROM abduction had improved meeting that goal, but her flexion had decreased slightly as pt reports feeling increased tightness there. She did well with P/ROM and manual ltherapy to focus on tightness at Lt axilla. Pt has been using her Lt UE with ADLs but isn't stretching much so instructed pt to get a ball to roll up wall to help her focus on end ROM stretching.    Rehab Potential Good   Clinical Impairments Affecting Rehab Potential none   PT Frequency 3x / week   PT Duration 8 weeks   PT Treatment/Interventions Manual techniques;Passive range of motion;Manual lymph drainage   PT Next Visit Plan Assess if pt got ball to roll up wall to help her focus on end ROM stretching and if she has worn her new compression bra(s) yet to assess comfort. Pt considering coming 1x/week next few weeks as she is doing well overall with her progress only being limited in end ROM flexion, discuss with PT at next appt.    PT Home Exercise Plan Roll ball up wall for flexion and abduction; wear new compression bras to assess comfort   Consulted and Agree with Plan of Care Patient      Patient will benefit from skilled therapeutic intervention in order to improve the following deficits and impairments:  Pain, Impaired UE functional use, Decreased strength, Increased edema  Visit Diagnosis: Lymphedema, not elsewhere classified  Stiffness of left shoulder, not elsewhere  classified  Pain in left shoulder     Problem List Patient Active Problem List   Diagnosis Date Noted  . Generalized anxiety disorder 03/14/2016    Class: Chronic  . Hyperglycemia 01/09/2016  . Chemotherapy-induced peripheral neuropathy ([place]) 09/21/2015  . History of chicken pox   . Shingles 05/15/2014  . Anxiety and depression 05/15/2014  . Rotator cuff tear 02/24/2014  . Hot flashes due to tamoxifen 02/20/2014  . Left shoulder pain with abduction 12/12/2013  . CVA (cerebral infarction) 07/18/2013  . TIA (transient ischemic attack) 07/18/2013  . Contracture of axilla 05/20/2013  . Primary cancer of upper inner quadrant of left female breast ([place]) 01/24/2013  . Anal fissure 05/03/2011  . Hemorrhoids, internal, with prolapse 05/03/2011    Otelia Limes, PTA 04/19/2016, 3:44 PM  [place], [name], [address] Phone: [phone]   Fax:  [phone]  Name: FALLON HAECKER MRN: [id number] Date of Birth: 1955-12-28

## 2016-04-20 ENCOUNTER — Other Ambulatory Visit: Payer: Self-pay | Admitting: Family Medicine

## 2016-04-21 ENCOUNTER — Ambulatory Visit: Payer: BC Managed Care – PPO | Admitting: Physical Therapy

## 2016-04-21 DIAGNOSIS — M25512 Pain in left shoulder: Secondary | ICD-10-CM | POA: Diagnosis not present

## 2016-04-21 DIAGNOSIS — M25612 Stiffness of left shoulder, not elsewhere classified: Secondary | ICD-10-CM

## 2016-04-21 DIAGNOSIS — I890 Lymphedema, not elsewhere classified: Secondary | ICD-10-CM

## 2016-04-21 NOTE — Therapy (Signed)
[place], [name], [address] Phone: [phone]   Fax:  [phone]  Physical Therapy Treatment  Patient Details  Name: Kristi [name] MRN: [id number] Date of Birth: [birth year] Referring Provider: Barry Dienes  Encounter Date: 04/21/2016      PT End of Session - 04/21/16 1206    Visit Number 13   Number of Visits 25   Date for PT Re-Evaluation 04/24/16   PT Start Time 1110   PT Stop Time 1150   PT Time Calculation (min) 40 min      Past Medical History  Diagnosis Date  . Anxiety   . Hypertension   . MRSA (methicillin resistant Staphylococcus aureus) 2009    right groin area-no issues now. 04-07-14 PCR screen negative today.  . Depression   . Anemia     Iron deficinecy anemia  . History of radiation therapy 09/09/13-10/28/13    45 gray to left breast, lumpectomy cavity boosted to 63 gray  . Neuropathy ([place])   . Arthritis   . History of chicken pox   . Breast cancer ([place])     left ,last radiation 2'15, last chemo 8'14  . Anxiety and depression 05/15/2014  . Hyperglycemia 01/09/2016    Past Surgical History  Procedure Laterality Date  . Anal sphincterotomy  04/2011  . Hemorrhoid surgery  04/2011    ligation  . Breast lumpectomy with needle localization Left 02/04/2013    Procedure: LEFT BREAST LUMPECTOMY WITH NEEDLE LOCALIZATION;  Surgeon: Stark Klein, MD;  Location: [facility];  Service: General;  Laterality: Left;  . Axillary lymph node dissection Left 02/04/2013    Procedure: LEFT AXILLARY LYMPH NODE DISSECTION;  Surgeon: Stark Klein, MD;  Location: [facility];  Service: General;  Laterality: Left;  End: 9798  . Portacath placement Right 02/04/2013    Procedure: INSERTION PORT-A-CATH;  Surgeon: Stark Klein, MD;  Location: [facility];  Service: General;  Laterality: Right;  Start Time: 9211.  Marland Kitchen Appendectomy  1980  . Breast surgery      Lumpectomy in april 2014  . Shoulder arthroscopy with rotator cuff repair and  subacromial decompression Left 02/24/2014    Procedure: SHOULDER ARTHROSCOPY WITH ROTATOR CUFF REPAIR AND SUBACROMIAL DECOMPRESSION;  Surgeon: Meredith Pel, MD;  Location: [facility];  Service: Orthopedics;  Laterality: Left;  LEFT SHOULDER DIAGNOSTIC OPERATIVE ARTHROSCOPY, SUBACROMIAL DECOMPRESSION, ROTATOR CUFF TEAR REPAIR  . Port-a-cath removal N/A 04/16/2014    Procedure: REMOVAL PORT-A-CATH;  Surgeon: Stark Klein, MD;  Location: [facility];  Service: General;  Laterality: N/A;    There were no vitals filed for this visit.      Subjective Assessment - 04/21/16 1117    Subjective pt states the she has only been able to weat the compressin bra for a short amount of time as it is tight   She says she is struggling with depression and anxiety and is having a bad day with her feet pain   Pertinent History Left lumpectomy and axillary lymph node dissection 02/04/13 followed by chemotherapy and radiation to left breast and axilla. She has struggled significantly with bilateral feet neuropathy, anxiety and depression.   Patient Stated Goals to have my breasts feel some sort of normal            [facility] PT Assessment - 04/21/16 0001    AROM   Left Shoulder Flexion 155 Degrees   Left Shoulder ABduction 170 Degrees  [place] Adult PT Treatment/Exercise - 04/21/16 0001    Shoulder Exercises: Sidelying   External Rotation Strengthening;Left;10 reps   External Rotation Weight (lbs) 1   ABduction AROM;Left;10 reps   Other Sidelying Exercises small circles with arm toward ceiling    Other Sidelying Exercises upper thoracic and anterior chest stretch by rotating back and reaching.   Shoulder Exercises: Standing   Other Standing Exercises standind UE ranger for flexion, extension in straight and diagonal planes  then continued with same with each foot on 2 inche step, then rocker board    Shoulder Exercises: Therapy Ball   Flexion 10 reps  With forward lean at top of  stretch   ABduction 10 reps  Lt UE with lean into top of stretch   Shoulder Exercises: Stretch   Wall Stretch - Flexion 2 reps;10 seconds   Other Shoulder Stretches step through doorwary to stretch into shoulder extension and external rotation    Manual Therapy   Myofascial Release at left axilla at junction of upeer back near tripceps and latts muscle trigger points and anterior chest at pec major muscle    Passive ROM To left shoulder in supine for ER, abduction, and flexion with gentle stretch                   Short Term Clinic Goals - 03/15/16 1346    CC Short Term Goal  #1   Title Pt to be able to verbalize lymphedema risk reduction practices   Status Achieved   CC Short Term Goal  #2   Title Pt to report decreased pain of less than 3/10 to improve comfort and allow increased use of LUE  Pt reports "it hasn't really bothered me much the last few days".    Status Achieved             Long Term Clinic Goals - 04/19/16 1542    CC Long Term Goal  #1   Title Pt to demonstrate 155 degrees of left shoulder flexion to allow pt to reach items overhead   Baseline 140, 160 degrees with assist from therapist to decrease shoulder hike 03/15/16, 145 degrees 03/22/16, 156 degrees 04/12/16, 150 degrees 04/19/16   Status On-going   CC Long Term Goal  #2   Title Pt to have appropriate compression bra for long term management of edema   Status Achieved   CC Long Term Goal  #3   Title Pt will report a 50% decrease in breast lymphedema symptoms   Status Achieved   CC Long Term Goal  #4   Title Pt will report decreased pain of less than 2/10 to allow improved use of LUE   Baseline 4, 1/10 today and pt reports last few days it "hasn't hurt her much, she hasn't really noticed it (pain)" , been 2/10 or less past few days, 0-1/10 since last visit 04/19/16   Status Achieved   CC Long Term Goal  #5   Title Pt will have an improved LLIS impairment score of less than 30% to allow improved  use of LUE   Status Partially Met            Plan - 04/21/16 1207    Clinical Impression Statement [name] is doing well with improvements in shoulder range of motion, but she continues to have tightness in anterior chest and tender trigger points in posterior axilla. She has not gotten a ball to follow through with that stretch at home  She  did well with active exericse all during session today.    Rehab Potential Good   Clinical Impairments Affecting Rehab Potential previous radiation,CIPN and breast lymphedema    PT Treatment/Interventions Manual techniques;Passive range of motion;Manual lymph drainage;Therapeutic exercise   PT Next Visit Plan Assess if new compressin bra is helping patient.  Continue with active exercise and manual techniques for shoulder    Consulted and Agree with Plan of Care Patient      Patient will benefit from skilled therapeutic intervention in order to improve the following deficits and impairments:  Pain, Impaired UE functional use, Decreased strength, Increased edema  Visit Diagnosis: Lymphedema, not elsewhere classified  Stiffness of left shoulder, not elsewhere classified  Pain in left shoulder     Problem List Patient Active Problem List   Diagnosis Date Noted  . Generalized anxiety disorder 03/14/2016    Class: Chronic  . Hyperglycemia 01/09/2016  . Chemotherapy-induced peripheral neuropathy ([place]) 09/21/2015  . History of chicken pox   . Shingles 05/15/2014  . Anxiety and depression 05/15/2014  . Rotator cuff tear 02/24/2014  . Hot flashes due to tamoxifen 02/20/2014  . Left shoulder pain with abduction 12/12/2013  . CVA (cerebral infarction) 07/18/2013  . TIA (transient ischemic attack) 07/18/2013  . Contracture of axilla 05/20/2013  . Primary cancer of upper inner quadrant of left female breast ([place]) 01/24/2013  . Anal fissure 05/03/2011  . Hemorrhoids, internal, with prolapse 05/03/2011   Donato Heinz. Owens Shark PT  [name] 04/21/2016, 12:12 PM  [place], [name], [address] Phone: [phone]   Fax:  [phone]  Name: DOMINI VANDEHEI MRN: [id number] Date of Birth: [birth year]

## 2016-04-25 ENCOUNTER — Encounter: Payer: Self-pay | Admitting: Physical Therapy

## 2016-04-25 ENCOUNTER — Ambulatory Visit: Payer: BC Managed Care – PPO | Admitting: Physical Therapy

## 2016-04-25 DIAGNOSIS — I890 Lymphedema, not elsewhere classified: Secondary | ICD-10-CM

## 2016-04-25 DIAGNOSIS — M25612 Stiffness of left shoulder, not elsewhere classified: Secondary | ICD-10-CM | POA: Diagnosis not present

## 2016-04-25 DIAGNOSIS — M25512 Pain in left shoulder: Secondary | ICD-10-CM

## 2016-04-25 NOTE — Therapy (Signed)
[place], [name], [address] Phone: [phone]   Fax:  [phone]  Physical Therapy Treatment  Patient Details  Name: Kristi [name] MRN: [id number] Date of Birth: [birth year] Referring Provider: Barry Dienes  Encounter Date: 04/25/2016      PT End of Session - 04/25/16 1615    Visit Number 14   Number of Visits 32   Date for PT Re-Evaluation 05/22/16   PT Start Time 2355   PT Stop Time 1515   PT Time Calculation (min) 42 min   Activity Tolerance Patient tolerated treatment well   Behavior During Therapy [facility] for tasks assessed/performed      Past Medical History  Diagnosis Date  . Anxiety   . Hypertension   . MRSA (methicillin resistant Staphylococcus aureus) 2009    right groin area-no issues now. 04-07-14 PCR screen negative today.  . Depression   . Anemia     Iron deficinecy anemia  . History of radiation therapy 09/09/13-10/28/13    45 gray to left breast, lumpectomy cavity boosted to 63 gray  . Neuropathy ([place])   . Arthritis   . History of chicken pox   . Breast cancer ([place])     left ,last radiation 2'15, last chemo 8'14  . Anxiety and depression 05/15/2014  . Hyperglycemia 01/09/2016    Past Surgical History  Procedure Laterality Date  . Anal sphincterotomy  04/2011  . Hemorrhoid surgery  04/2011    ligation  . Breast lumpectomy with needle localization Left 02/04/2013    Procedure: LEFT BREAST LUMPECTOMY WITH NEEDLE LOCALIZATION;  Surgeon: Stark Klein, MD;  Location: [facility];  Service: General;  Laterality: Left;  . Axillary lymph node dissection Left 02/04/2013    Procedure: LEFT AXILLARY LYMPH NODE DISSECTION;  Surgeon: Stark Klein, MD;  Location: [facility];  Service: General;  Laterality: Left;  End: 7322  . Portacath placement Right 02/04/2013    Procedure: INSERTION PORT-A-CATH;  Surgeon: Stark Klein, MD;  Location: [facility];  Service: General;  Laterality: Right;  Start Time: 0254.  Marland Kitchen  Appendectomy  1980  . Breast surgery      Lumpectomy in april 2014  . Shoulder arthroscopy with rotator cuff repair and subacromial decompression Left 02/24/2014    Procedure: SHOULDER ARTHROSCOPY WITH ROTATOR CUFF REPAIR AND SUBACROMIAL DECOMPRESSION;  Surgeon: Meredith Pel, MD;  Location: [facility];  Service: Orthopedics;  Laterality: Left;  LEFT SHOULDER DIAGNOSTIC OPERATIVE ARTHROSCOPY, SUBACROMIAL DECOMPRESSION, ROTATOR CUFF TEAR REPAIR  . Port-a-cath removal N/A 04/16/2014    Procedure: REMOVAL PORT-A-CATH;  Surgeon: Stark Klein, MD;  Location[facility];  Service: General;  Laterality: N/A;    There were no vitals filed for this visit.      Subjective Assessment - 04/25/16 1435    Subjective I try to wear this bra as much as I can. It is just so tight. It is going to take a minute to get used to them. I have been using the [place].    Pertinent History Left lumpectomy and axillary lymph node dissection 02/04/13 followed by chemotherapy and radiation to left breast and axilla. She has struggled significantly with bilateral feet neuropathy, anxiety and depression.   Patient Stated Goals to have my breasts feel some sort of normal   Currently in Pain? No/denies   Pain Score 0-No pain                         [facility]  Adult PT Treatment/Exercise - 04/25/16 0001    Shoulder Exercises: Sidelying   External Rotation Strengthening;Left;10 reps  x 2 sets   External Rotation Weight (lbs) 1   ABduction AROM;Left;10 reps   Other Sidelying Exercises small circles with arm toward ceiling    Other Sidelying Exercises upper thoracic and anterior chest stretch by rotating back and reaching.   Shoulder Exercises: Standing   Other Standing Exercises Standing with back against wall for 3 way raises (into flexion 3lb, scaption 3lb and abduction 2lb to 90 degrees) x 10 each    Shoulder Exercises: Pulleys   Flexion 2 minutes   ABduction 2 minutes   Shoulder Exercises: Therapy Ball    Flexion 10 reps  With forward lean at top of stretch   ABduction 10 reps  Lt UE with lean into top of stretch   Shoulder Exercises: Stretch   Wall Stretch - Flexion 10 seconds;4 reps   Other Shoulder Stretches step through doorwary to stretch into shoulder extension and external rotation    Manual Therapy   Myofascial Release at left axilla at junction of upeer back near tripceps and latts muscle trigger points and anterior chest at pec major muscle    Passive ROM To left shoulder in supine for ER, abduction, and flexion with gentle stretch                PT Education - 04/25/16 1614    Education provided Yes   Education Details Across chest stretch for back and tricep   Person(s) Educated Patient   Methods Explanation;Demonstration;Handout   Comprehension Verbalized understanding;Returned demonstration           Short Term Clinic Goals - 03/15/16 1346    CC Short Term Goal  #1   Title Pt to be able to verbalize lymphedema risk reduction practices   Status Achieved   CC Short Term Goal  #2   Title Pt to report decreased pain of less than 3/10 to improve comfort and allow increased use of LUE  Pt reports "it hasn't really bothered me much the last few days".    Status Achieved             Long Term Clinic Goals - 04/25/16 1619    CC Long Term Goal  #1   Title Pt to demonstrate 155 degrees of left shoulder flexion to allow pt to reach items overhead   Baseline 140, 160 degrees with assist from therapist to decrease shoulder hike 03/15/16, 145 degrees 03/22/16, 156 degrees 04/12/16, 150 degrees 04/19/16   Time 8   Period Weeks   Status On-going   CC Long Term Goal  #2   Title Pt to have appropriate compression bra for long term management of edema   Status Achieved   CC Long Term Goal  #3   Title Pt will report a 50% decrease in breast lymphedema symptoms   Baseline Maybe 30% better on 04/05/16, 50% 04/12/16   Status Achieved   CC Long Term Goal  #4   Title Pt  will report decreased pain of less than 2/10 to allow improved use of LUE   Baseline 4, 1/10 today and pt reports last few days it "hasn't hurt her much, she hasn't really noticed it (pain)" , been 2/10 or less past few days, 0-1/10 since last visit 04/19/16   Status Achieved   CC Long Term Goal  #5   Title Pt will have an improved LLIS impairment score of less than  30% to allow improved use of LUE   Baseline 61% impairment, 31% limited at this time 04/12/16   Time 8   Period Weeks   Status Partially Met            Plan - 04/25/16 1616    Clinical Impression Statement Pt continues to have increased tightness in left pec and left posterior axilla. Instructed pt in new strech today for posterior axilla and pt stated she could really feel the stretch. She is doing well with her exercises and today was not having any pain. Added new stretch to her home exercise program. Pt would benefit from continued skilled PT services to work towards goals that are not met.    Rehab Potential Good   Clinical Impairments Affecting Rehab Potential previous radiation,CIPN and breast lymphedema    PT Frequency 3x / week   PT Duration 8 weeks   PT Treatment/Interventions Manual techniques;Passive range of motion;Manual lymph drainage;Therapeutic exercise   PT Next Visit Plan assess goals, continue with exercises, scap mobs, and myofascial to anterior and posterior axilla   PT Home Exercise Plan Roll ball up wall for flexion and abduction; wear new compression bras to assess comfort   Consulted and Agree with Plan of Care Patient      Patient will benefit from skilled therapeutic intervention in order to improve the following deficits and impairments:  Pain, Impaired UE functional use, Decreased strength, Increased edema  Visit Diagnosis: Lymphedema, not elsewhere classified - Plan: PT plan of care cert/re-cert  Stiffness of left shoulder, not elsewhere classified - Plan: PT plan of care cert/re-cert  Pain  in left shoulder - Plan: PT plan of care cert/re-cert     Problem List Patient Active Problem List   Diagnosis Date Noted  . Generalized anxiety disorder 03/14/2016    Class: Chronic  . Hyperglycemia 01/09/2016  . Chemotherapy-induced peripheral neuropathy ([place]) 09/21/2015  . History of chicken pox   . Shingles 05/15/2014  . Anxiety and depression 05/15/2014  . Rotator cuff tear 02/24/2014  . Hot flashes due to tamoxifen 02/20/2014  . Left shoulder pain with abduction 12/12/2013  . CVA (cerebral infarction) 07/18/2013  . TIA (transient ischemic attack) 07/18/2013  . Contracture of axilla 05/20/2013  . Primary cancer of upper inner quadrant of left female breast ([place]) 01/24/2013  . Anal fissure 05/03/2011  . Hemorrhoids, internal, with prolapse 05/03/2011    Alexia Freestone 04/25/2016, 4:23 PM  [place], [name], [address] Phone: [phone]   Fax:  [phone]  Name: Kristi [name] MRN: [id number] Date of Birth: 14-Nov-1955    Allyson Sabal, PT 04/25/2016 4:23 PM

## 2016-04-26 DIAGNOSIS — F411 Generalized anxiety disorder: Secondary | ICD-10-CM | POA: Diagnosis not present

## 2016-04-27 ENCOUNTER — Ambulatory Visit: Payer: BC Managed Care – PPO | Admitting: Physical Therapy

## 2016-04-27 DIAGNOSIS — M25612 Stiffness of left shoulder, not elsewhere classified: Secondary | ICD-10-CM

## 2016-04-27 DIAGNOSIS — M25512 Pain in left shoulder: Secondary | ICD-10-CM

## 2016-04-27 DIAGNOSIS — I890 Lymphedema, not elsewhere classified: Secondary | ICD-10-CM | POA: Diagnosis not present

## 2016-04-27 NOTE — Therapy (Signed)
[place], [name], [address] Phone: [phone]   Fax:  [phone]  Physical Therapy Treatment  Patient Details  Name: Kristi [name] MRN: [id number] Date of Birth: 10-07-1956 Referring Provider: Barry Dienes  Encounter Date: 04/27/2016      PT End of Session - 04/27/16 1709    Visit Number 15   Number of Visits 32   Date for PT Re-Evaluation 05/22/16   PT Start Time 1430   PT Stop Time 1515   PT Time Calculation (min) 45 min   Activity Tolerance Patient tolerated treatment well      Past Medical History  Diagnosis Date  . Anxiety   . Hypertension   . MRSA (methicillin resistant Staphylococcus aureus) 2009    right groin area-no issues now. 04-07-14 PCR screen negative today.  . Depression   . Anemia     Iron deficinecy anemia  . History of radiation therapy 09/09/13-10/28/13    45 gray to left breast, lumpectomy cavity boosted to 63 gray  . Neuropathy ([place])   . Arthritis   . History of chicken pox   . Breast cancer ([place])     left ,last radiation 2'15, last chemo 8'14  . Anxiety and depression 05/15/2014  . Hyperglycemia 01/09/2016    Past Surgical History  Procedure Laterality Date  . Anal sphincterotomy  04/2011  . Hemorrhoid surgery  04/2011    ligation  . Breast lumpectomy with needle localization Left 02/04/2013    Procedure: LEFT BREAST LUMPECTOMY WITH NEEDLE LOCALIZATION;  Surgeon: Stark Klein, MD;  Location: [facility];  Service: General;  Laterality: Left;  . Axillary lymph node dissection Left 02/04/2013    Procedure: LEFT AXILLARY LYMPH NODE DISSECTION;  Surgeon: Stark Klein, MD;  Location: [place];  Service: General;  Laterality: Left;  End: 1188  . Portacath placement Right 02/04/2013    Procedure: INSERTION PORT-A-CATH;  Surgeon: Stark Klein, MD;  Location: [facility];  Service: General;  Laterality: Right;  Start Time: 6773.  Marland Kitchen Appendectomy  1980  . Breast surgery      Lumpectomy in april 2014   . Shoulder arthroscopy with rotator cuff repair and subacromial decompression Left 02/24/2014    Procedure: SHOULDER ARTHROSCOPY WITH ROTATOR CUFF REPAIR AND SUBACROMIAL DECOMPRESSION;  Surgeon: Meredith Pel, MD;  Location: [facility];  Service: Orthopedics;  Laterality: Left;  LEFT SHOULDER DIAGNOSTIC OPERATIVE ARTHROSCOPY, SUBACROMIAL DECOMPRESSION, ROTATOR CUFF TEAR REPAIR  . Port-a-cath removal N/A 04/16/2014    Procedure: REMOVAL PORT-A-CATH;  Surgeon: Stark Klein, MD;  [facility];  Service: General;  Laterality: N/A;    There were no vitals filed for this visit.      Subjective Assessment - 04/27/16 1441    Subjective Pt reports she is having some shoulder soreness    Pertinent History Left lumpectomy and axillary lymph node dissection 02/04/13 followed by chemotherapy and radiation to left breast and axilla. She has struggled significantly with bilateral feet neuropathy, anxiety and depression.   Patient Stated Goals to have my breasts feel some sort of normal   Currently in Pain? Yes   Pain Score 4    Pain Location Shoulder   Pain Orientation Left   Pain Descriptors / Indicators Sore   Pain Radiating Towards toward the upper arm                         [facility] Adult PT Treatment/Exercise - 04/27/16 0001  Shoulder Exercises: Supine   Protraction Strengthening;Left;10 reps;Weights   Protraction Weight (lbs) 2   Protraction Limitations 3 sets of 10    Shoulder Exercises: Sidelying   External Rotation Strengthening;Left;10 reps;Weights   External Rotation Weight (lbs) 2   External Rotation Limitations 3 setsof10    Other Sidelying Exercises small circles with arm toward ceiling    Shoulder Exercises: Standing   External Rotation Strengthening;Left;10 reps;Theraband   Theraband Level (Shoulder External Rotation) Level 2 (Red)   Internal Rotation Strengthening;Left;10 reps;Theraband   Theraband Level (Shoulder Internal Rotation) Level 2 (Red)   Flexion  Strengthening;Left;10 reps;Theraband   Theraband Level (Shoulder Flexion) Level 2 (Red)   Extension Strengthening;Left;10 reps;Theraband   Theraband Level (Shoulder Extension) Level 2 (Red)   Other Standing Exercises held wand overhead and had pt do scapular elevation and depression with 2 mirrors posistioned to she could see movement    Other Standing Exercises attempted weight bearing and weight shifting over arms leaning forward with hands on mat  also attemped this in quaduped    Shoulder Exercises: Pulleys   Flexion 2 minutes                   Short Term Clinic Goals - 03/15/16 1346    CC Short Term Goal  #1   Title Pt to be able to verbalize lymphedema risk reduction practices   Status Achieved   CC Short Term Goal  #2   Title Pt to report decreased pain of less than 3/10 to improve comfort and allow increased use of LUE  Pt reports "it hasn't really bothered me much the last few days".    Status Achieved             Long Term Clinic Goals - 04/25/16 1619    CC Long Term Goal  #1   Title Pt to demonstrate 155 degrees of left shoulder flexion to allow pt to reach items overhead   Baseline 140, 160 degrees with assist from therapist to decrease shoulder hike 03/15/16, 145 degrees 03/22/16, 156 degrees 04/12/16, 150 degrees 04/19/16   Time 8   Period Weeks   Status On-going   CC Long Term Goal  #2   Title Pt to have appropriate compression bra for long term management of edema   Status Achieved   CC Long Term Goal  #3   Title Pt will report a 50% decrease in breast lymphedema symptoms   Baseline Maybe 30% better on 04/05/16, 50% 04/12/16   Status Achieved   CC Long Term Goal  #4   Title Pt will report decreased pain of less than 2/10 to allow improved use of LUE   Baseline 4, 1/10 today and pt reports last few days it "hasn't hurt her much, she hasn't really noticed it (pain)" , been 2/10 or less past few days, 0-1/10 since last visit 04/19/16   Status Achieved   CC  Long Term Goal  #5   Title Pt will have an improved LLIS impairment score of less than 30% to allow improved use of LUE   Baseline 61% impairment, 31% limited at this time 04/12/16   Time 8   Period Weeks   Status Partially Met            Plan - 04/27/16 1709    Clinical Impression Statement Treatment today focused on scapular mobility and strenthening.  Pt has some difficulty isolating scpualar movement but improved with tactile and visual cues today. She continues to  have firmness at anterior chest muscle    Rehab Potential Good   Clinical Impairments Affecting Rehab Potential previous radiation,CIPN and breast lymphedema    PT Next Visit Plan  continue with exercises especailly scapular strengthening ,  scap mobs, and myofascial to anterior and posterior axilla   Consulted and Agree with Plan of Care Patient      Patient will benefit from skilled therapeutic intervention in order to improve the following deficits and impairments:  Pain, Impaired UE functional use, Decreased strength, Increased edema  Visit Diagnosis: Lymphedema, not elsewhere classified  Stiffness of left shoulder, not elsewhere classified  Pain in left shoulder     Problem List Patient Active Problem List   Diagnosis Date Noted  . Generalized anxiety disorder 03/14/2016    Class: Chronic  . Hyperglycemia 01/09/2016  . Chemotherapy-induced peripheral neuropathy ([place]) 09/21/2015  . History of chicken pox   . Shingles 05/15/2014  . Anxiety and depression 05/15/2014  . Rotator cuff tear 02/24/2014  . Hot flashes due to tamoxifen 02/20/2014  . Left shoulder pain with abduction 12/12/2013  . CVA (cerebral infarction) 07/18/2013  . TIA (transient ischemic attack) 07/18/2013  . Contracture of axilla 05/20/2013  . Primary cancer of upper inner quadrant of left female breast ([place]) 01/24/2013  . Anal fissure 05/03/2011  . Hemorrhoids, internal, with prolapse 05/03/2011   Donato Heinz. Owens Shark PT   [name] 04/27/2016, 5:13 PM  [place], [name], [address] Phone: [phone]   Fax:  [phone]  Name: Kristi [name] MRN: [id number] Date of Birth: 07-Jun-1956

## 2016-05-02 ENCOUNTER — Ambulatory Visit: Payer: BC Managed Care – PPO | Admitting: Physical Therapy

## 2016-05-02 DIAGNOSIS — M25612 Stiffness of left shoulder, not elsewhere classified: Secondary | ICD-10-CM | POA: Diagnosis not present

## 2016-05-02 DIAGNOSIS — M25512 Pain in left shoulder: Secondary | ICD-10-CM | POA: Diagnosis not present

## 2016-05-02 DIAGNOSIS — I890 Lymphedema, not elsewhere classified: Secondary | ICD-10-CM | POA: Diagnosis not present

## 2016-05-02 NOTE — Therapy (Signed)
[place], [name], [address] Phone: [phone]   Fax:  [phone]  Physical Therapy Treatment  Patient Details  Name: Kristi [name] MRN: [id number] Date of Birth: [birth year] Referring Provider: Barry Dienes  Encounter Date: 05/02/2016      PT End of Session - 05/02/16 1645    Visit Number 16   Number of Visits 32   Date for PT Re-Evaluation 05/22/16   PT Start Time [id number]   PT Stop Time 1430   PT Time Calculation (min) 45 min   Activity Tolerance Patient tolerated treatment well   Behavior During Therapy [facility] for tasks assessed/performed      Past Medical History  Diagnosis Date  . Anxiety   . Hypertension   . MRSA (methicillin resistant Staphylococcus aureus) 2009    right groin area-no issues now. 04-07-14 PCR screen negative today.  . Depression   . Anemia     Iron deficinecy anemia  . History of radiation therapy 09/09/13-10/28/13    45 gray to left breast, lumpectomy cavity boosted to 63 gray  . Neuropathy ([place])   . Arthritis   . History of chicken pox   . Breast cancer ([place])     left ,last radiation 2'15, last chemo 8'14  . Anxiety and depression 05/15/2014  . Hyperglycemia 01/09/2016    Past Surgical History  Procedure Laterality Date  . Anal sphincterotomy  04/2011  . Hemorrhoid surgery  04/2011    ligation  . Breast lumpectomy with needle localization Left 02/04/2013    Procedure: LEFT BREAST LUMPECTOMY WITH NEEDLE LOCALIZATION;  Surgeon: Stark Klein, MD;  Location: [place];  Service: General;  Laterality: Left;  . Axillary lymph node dissection Left 02/04/2013    Procedure: LEFT AXILLARY LYMPH NODE DISSECTION;  Surgeon: Stark Klein, MD;  Location: [facility];  Service: General;  Laterality: Left;  End: [id number]  . Portacath placement Right 02/04/2013    Procedure: INSERTION PORT-A-CATH;  Surgeon: Stark Klein, MD;  Location: [facility];  Service: General;  Laterality: Right;  Start [id number].  Marland Kitchen  Appendectomy  1980  . Breast surgery      Lumpectomy in april 2014  . Shoulder arthroscopy with rotator cuff repair and subacromial decompression Left 02/24/2014    Procedure: SHOULDER ARTHROSCOPY WITH ROTATOR CUFF REPAIR AND SUBACROMIAL DECOMPRESSION;  Surgeon: Meredith Pel, MD;  Location: [place] [facility];  Service: Orthopedics;  Laterality: Left;  LEFT SHOULDER DIAGNOSTIC OPERATIVE ARTHROSCOPY, SUBACROMIAL DECOMPRESSION, ROTATOR CUFF TEAR REPAIR  . Port-a-cath removal N/A 04/16/2014    Procedure: REMOVAL PORT-A-CATH;  Surgeon: Stark Klein, MD;  [facility];  Service: General;  Laterality: N/A;    There were no vitals filed for this visit.      Subjective Assessment - 05/02/16 1354    Subjective Pt still has some sorenss in axilla, but she is happy with out it is progressing.  She is using the flexitouch and feels that it is helping    Pertinent History Left lumpectomy and axillary lymph node dissection 02/04/13 followed by chemotherapy and radiation to left breast and axilla. She has struggled significantly with bilateral feet neuropathy, anxiety and depression. History includes of rotator cuf surgery on left arm    Patient Stated Goals to have my breasts feel some sort of normal   Currently in Pain? No/denies  just soreness in her axilla             [facility] PT Assessment - 05/02/16 0001  AROM   Left Shoulder Flexion 160 Degrees   Left Shoulder ABduction 170 Degrees                     [facility] Adult PT Treatment/Exercise - 05/02/16 0001    High Level Balance   High Level Balance Activities Other (comment)   High Level Balance Comments walking mini lunges with bilateral external rotation in stande phase between each lunge    Lumbar Exercises: Stretches   Lower Trunk Rotation 5 reps;10 seconds   Lower Trunk Rotation Limitations with goal post arms    Lumbar Exercises: Aerobic   Stationary Bike --  7  minutes at level one    Shoulder Exercises: Supine   Flexion  Strengthening;Left;10 reps;Weights   Shoulder Flexion Weight (lbs) 2   Flexion Limitations 3 sets of 10 to 90 degrees of flexion in supine    Other Supine Exercises diagonal elevation with external rotation    Shoulder Exercises: Seated   External Rotation Strengthening;Both;10 reps;Theraband   Theraband Level (Shoulder External Rotation) Level 2 (Red)   External Rotation Limitations sitting on physioball    Shoulder Exercises: Sidelying   External Rotation Strengthening;10 reps;20 reps   External Rotation Weight (lbs) 10 with no weight , 10 with 2 pounds   ABduction Strengthening;Left;10 reps;Weights   ABduction Weight (lbs) 2   Shoulder Exercises: ROM/Strengthening   Other ROM/Strengthening Exercises upper thoracic stretches in sitting and in supine over foam roller    Manual Therapy   Myofascial Release to pec and in axilla for subscapularis and latts    Passive ROM To left shoulder in supine for ER, abduction, and flexion with gentle stretch                   Short Term Clinic Goals - 03/15/16 1346    CC Short Term Goal  #1   Title Pt to be able to verbalize lymphedema risk reduction practices   Status Achieved   CC Short Term Goal  #2   Title Pt to report decreased pain of less than 3/10 to improve comfort and allow increased use of LUE  Pt reports "it hasn't really bothered me much the last few days".    Status Achieved             Long Term Clinic Goals - 05/02/16 1649    CC Long Term Goal  #1   Title Pt to demonstrate 155 degrees of left shoulder flexion to allow pt to reach items overhead   Baseline 140, 160 degrees with assist from therapist to decrease shoulder hike 03/15/16, 145 degrees 03/22/16, 156 degrees 04/12/16, 150 degrees 04/19/16, 160 on 05/02/2016   Status Achieved   CC Long Term Goal  #2   Title Pt to have appropriate compression bra for long term management of edema   Status Achieved   CC Long Term Goal  #3   Title Pt will report a 50%  decrease in breast lymphedema symptoms   Status Achieved   CC Long Term Goal  #4   Title Pt will report decreased pain of less than 2/10 to allow improved use of LUE   Status Achieved            Plan - 05/02/16 1646    Clinical Impression Statement Pt continues to improve in shoulder range of motion, strength and general mobility  Upgraded program for more general strength and balance work today    Clinical Impairments Affecting Rehab Potential  previous radiation,CIPN and breast lymphedema    PT Next Visit Plan  continue with exercises especailly scapular strengthening ,  scap mobs, and myofascial to anterior and posterior axilla  Assess goals      Patient will benefit from skilled therapeutic intervention in order to improve the following deficits and impairments:  Pain, Impaired UE functional use, Decreased strength, Increased edema  Visit Diagnosis: Lymphedema, not elsewhere classified  Stiffness of left shoulder, not elsewhere classified  Pain in left shoulder     Problem List Patient Active Problem List   Diagnosis Date Noted  . Generalized anxiety disorder 03/14/2016    Class: Chronic  . Hyperglycemia 01/09/2016  . Chemotherapy-induced peripheral neuropathy ([place]) 09/21/2015  . History of chicken pox   . Shingles 05/15/2014  . Anxiety and depression 05/15/2014  . Rotator cuff tear 02/24/2014  . Hot flashes due to tamoxifen 02/20/2014  . Left shoulder pain with abduction 12/12/2013  . CVA (cerebral infarction) 07/18/2013  . TIA (transient ischemic attack) 07/18/2013  . Contracture of axilla 05/20/2013  . Primary cancer of upper inner quadrant of left female breast ([place]) 01/24/2013  . Anal fissure 05/03/2011  . Hemorrhoids, internal, with prolapse 05/03/2011   Donato Heinz. Owens Shark PT  [name] 05/02/2016, 4:50 PM  [place], [name], [address] Phone: [phone]   Fax:   [phone]  Name: Kristi [name] MRN: [id number] Date of Birth: 01/14/1956

## 2016-05-04 ENCOUNTER — Ambulatory Visit: Payer: BC Managed Care – PPO | Admitting: Physical Therapy

## 2016-05-04 DIAGNOSIS — M25512 Pain in left shoulder: Secondary | ICD-10-CM

## 2016-05-04 DIAGNOSIS — I890 Lymphedema, not elsewhere classified: Secondary | ICD-10-CM | POA: Diagnosis not present

## 2016-05-04 DIAGNOSIS — M25612 Stiffness of left shoulder, not elsewhere classified: Secondary | ICD-10-CM | POA: Diagnosis not present

## 2016-05-04 NOTE — Therapy (Signed)
[place], [name], [address] Phone: [phone]   Fax:  [phone]  Physical Therapy Treatment  Patient Details  Name: Kristi [name] MRN: [id number] Date of Birth: Jun 23, 1956 Referring Provider: Barry Dienes  Encounter Date: 05/04/2016      PT End of Session - 05/04/16 1450    Visit Number 17   Number of Visits 32   Date for PT Re-Evaluation 05/22/16   PT Start Time [id number]   PT Stop Time 1435   PT Time Calculation (min) 40 min   Activity Tolerance Patient tolerated treatment well   Behavior During Therapy [facility] for tasks assessed/performed      Past Medical History  Diagnosis Date  . Anxiety   . Hypertension   . MRSA (methicillin resistant Staphylococcus aureus) 2009    right groin area-no issues now. 04-07-14 PCR screen negative today.  . Depression   . Anemia     Iron deficinecy anemia  . History of radiation therapy 09/09/13-10/28/13    45 gray to left breast, lumpectomy cavity boosted to 63 gray  . Neuropathy ([place])   . Arthritis   . History of chicken pox   . Breast cancer ([place])     left ,last radiation 2'15, last chemo 8'14  . Anxiety and depression 05/15/2014  . Hyperglycemia 01/09/2016    Past Surgical History  Procedure Laterality Date  . Anal sphincterotomy  04/2011  . Hemorrhoid surgery  04/2011    ligation  . Breast lumpectomy with needle localization Left 02/04/2013    Procedure: LEFT BREAST LUMPECTOMY WITH NEEDLE LOCALIZATION;  Surgeon: Stark Klein, MD;  Location: [place];  Service: General;  Laterality: Left;  . Axillary lymph node dissection Left 02/04/2013    Procedure: LEFT AXILLARY LYMPH NODE DISSECTION;  Surgeon: Stark Klein, MD;  Location: [place];  Service: General;  Laterality: Left;  End: [id number]  . Portacath placement Right 02/04/2013    Procedure: INSERTION PORT-A-CATH;  Surgeon: Stark Klein, MD;  Location: [place];  Service: General;  Laterality: Right;  Start [id number].  Marland Kitchen  Appendectomy  1980  . Breast surgery      Lumpectomy in april 2014  . Shoulder arthroscopy with rotator cuff repair and subacromial decompression Left 02/24/2014    Procedure: SHOULDER ARTHROSCOPY WITH ROTATOR CUFF REPAIR AND SUBACROMIAL DECOMPRESSION;  Surgeon: Meredith Pel, MD;  Location: [place];  Service: Orthopedics;  Laterality: Left;  LEFT SHOULDER DIAGNOSTIC OPERATIVE ARTHROSCOPY, SUBACROMIAL DECOMPRESSION, ROTATOR CUFF TEAR REPAIR  . Port-a-cath removal N/A 04/16/2014    Procedure: REMOVAL PORT-A-CATH;  Surgeon: Stark Klein, MD;  [facility];  Service: General;  Laterality: N/A;    There were no vitals filed for this visit.      Subjective Assessment - 05/04/16 1408    Subjective woke with with cramping in left upper arm    Pertinent History Left lumpectomy and axillary lymph node dissection 02/04/13 followed by chemotherapy and radiation to left breast and axilla. She has struggled significantly with bilateral feet neuropathy, anxiety and depression. History includes of rotator cuf surgery on left arm    Patient Stated Goals to have my breasts feel some sort of normal   Currently in Pain? Yes   Pain Score 1    Pain Location Arm   Pain Orientation Left;Upper   Pain Descriptors / Indicators Cramping  [facility] Adult PT Treatment/Exercise - 05/04/16 0001    Neck Exercises: Seated   Lateral Flexion Both;5 reps   Money 10 reps   Money Limitations yellow therabands    Shoulder Rolls Backwards;Forwards;5 reps   Other Seated Exercise neck rotations    Other Seated Exercise upper thoracic extension    Manual Therapy   Soft tissue mobilization soft tissue mobilization.with deep pressure at trigger points followed by scapular range of motion .                   Short Term Clinic Goals - 03/15/16 1346    CC Short Term Goal  #1   Title Pt to be able to verbalize lymphedema risk reduction practices   Status Achieved   CC  Short Term Goal  #2   Title Pt to report decreased pain of less than 3/10 to improve comfort and allow increased use of LUE  Pt reports "it hasn't really bothered me much the last few days".    Status Achieved             Long Term Clinic Goals - 05/02/16 1649    CC Long Term Goal  #1   Title Pt to demonstrate 155 degrees of left shoulder flexion to allow pt to reach items overhead   Baseline 140, 160 degrees with assist from therapist to decrease shoulder hike 03/15/16, 145 degrees 03/22/16, 156 degrees 04/12/16, 150 degrees 04/19/16, 160 on 05/02/2016   Status Achieved   CC Long Term Goal  #2   Title Pt to have appropriate compression bra for long term management of edema   Status Achieved   CC Long Term Goal  #3   Title Pt will report a 50% decrease in breast lymphedema symptoms   Status Achieved   CC Long Term Goal  #4   Title Pt will report decreased pain of less than 2/10 to allow improved use of LUE   Status Achieved            Plan - 05/04/16 1448    Clinical Impression Statement Pt with cramping in upper arm today and found to have tightness in upper trap and cervical region    Clinical Impairments Affecting Rehab Potential previous radiation,CIPN and breast lymphedema    PT Treatment/Interventions Manual techniques;Passive range of motion;Manual lymph drainage;Therapeutic exercise   PT Next Visit Plan  continue with exercises especailly scapular strengthening ,  scap mobs, and myofascial to anterior and posterior axilla  Assess goals   Consulted and Agree with Plan of Care Patient      Patient will benefit from skilled therapeutic intervention in order to improve the following deficits and impairments:  Pain, Impaired UE functional use, Decreased strength, Increased edema  Visit Diagnosis: Lymphedema, not elsewhere classified  Stiffness of left shoulder, not elsewhere classified  Pain in left shoulder     Problem List Patient Active Problem List   Diagnosis  Date Noted  . Generalized anxiety disorder 03/14/2016    Class: Chronic  . Hyperglycemia 01/09/2016  . Chemotherapy-induced peripheral neuropathy ([place]) 09/21/2015  . History of chicken pox   . Shingles 05/15/2014  . Anxiety and depression 05/15/2014  . Rotator cuff tear 02/24/2014  . Hot flashes due to tamoxifen 02/20/2014  . Left shoulder pain with abduction 12/12/2013  . CVA (cerebral infarction) 07/18/2013  . TIA (transient ischemic attack) 07/18/2013  . Contracture of axilla 05/20/2013  . Primary cancer of upper inner quadrant of left female breast ([place]) 01/24/2013  .  Anal fissure 05/03/2011  . Hemorrhoids, internal, with prolapse 05/03/2011   Donato Heinz. Owens Shark PT  [name] 05/04/2016, 2:52 PM  [place], [name], [address] Phone: [phone]   Fax:  [phone]  Name: HLEE DAWES MRN: [id number] Date of Birth: [birth year]

## 2016-05-10 ENCOUNTER — Ambulatory Visit: Payer: BC Managed Care – PPO | Attending: General Surgery

## 2016-05-10 DIAGNOSIS — I890 Lymphedema, not elsewhere classified: Secondary | ICD-10-CM | POA: Insufficient documentation

## 2016-05-10 DIAGNOSIS — M25512 Pain in left shoulder: Secondary | ICD-10-CM | POA: Insufficient documentation

## 2016-05-10 DIAGNOSIS — M25612 Stiffness of left shoulder, not elsewhere classified: Secondary | ICD-10-CM | POA: Insufficient documentation

## 2016-05-10 NOTE — Therapy (Signed)
[place], [name], [address] Phone: [phone]   Fax:  [phone]  Physical Therapy Treatment  Patient Details  Name: Kristi [name] MRN: [id number] Date of Birth: [birth year] Referring Provider: Barry Dienes  Encounter Date: 05/10/2016      PT End of Session - 05/10/16 1603    Visit Number 18   Number of Visits 32   Date for PT Re-Evaluation 05/22/16   PT Start Time 1522   PT Stop Time 1602   PT Time Calculation (min) 40 min   Activity Tolerance Patient tolerated treatment well   Behavior During Therapy [facility] for tasks assessed/performed      Past Medical History  Diagnosis Date  . Anxiety   . Hypertension   . MRSA (methicillin resistant Staphylococcus aureus) 2009    right groin area-no issues now. 04-07-14 PCR screen negative today.  . Depression   . Anemia     Iron deficinecy anemia  . History of radiation therapy 09/09/13-10/28/13    45 gray to left breast, lumpectomy cavity boosted to 63 gray  . Neuropathy ([place])   . Arthritis   . History of chicken pox   . Breast cancer ([place])     left ,last radiation 2'15, last chemo 8'14  . Anxiety and depression 05/15/2014  . Hyperglycemia 01/09/2016    Past Surgical History  Procedure Laterality Date  . Anal sphincterotomy  04/2011  . Hemorrhoid surgery  04/2011    ligation  . Breast lumpectomy with needle localization Left 02/04/2013    Procedure: LEFT BREAST LUMPECTOMY WITH NEEDLE LOCALIZATION;  Surgeon: Stark Klein, MD;  Location: [place];  Service: General;  Laterality: Left;  . Axillary lymph node dissection Left 02/04/2013    Procedure: LEFT AXILLARY LYMPH NODE DISSECTION;  Surgeon: Stark Klein, MD;  Location: [place];  Service: General;  Laterality: Left;  End: 3546  . Portacath placement Right 02/04/2013    Procedure: INSERTION PORT-A-CATH;  Surgeon: Stark Klein, MD;  Location: [facility];  Service: General;  Laterality: Right;  Start Time: 5681.  Marland Kitchen  Appendectomy  1980  . Breast surgery      Lumpectomy in april 2014  . Shoulder arthroscopy with rotator cuff repair and subacromial decompression Left 02/24/2014    Procedure: SHOULDER ARTHROSCOPY WITH ROTATOR CUFF REPAIR AND SUBACROMIAL DECOMPRESSION;  Surgeon: Meredith Pel, MD;  Location: [facility];  Service: Orthopedics;  Laterality: Left;  LEFT SHOULDER DIAGNOSTIC OPERATIVE ARTHROSCOPY, SUBACROMIAL DECOMPRESSION, ROTATOR CUFF TEAR REPAIR  . Port-a-cath removal N/A 04/16/2014    Procedure: REMOVAL PORT-A-CATH;  Surgeon: Stark Klein, MD;  Location: [facility];  Service: General;  Laterality: N/A;    There were no vitals filed for this visit.      Subjective Assessment - 05/10/16 1530    Subjective The cramping in my arm is better this week. It stopped the day after my last visit and hasn't bothered me since. My Lt shoulder is doing pretty good, I think my ROM is getting better.    Pertinent History Left lumpectomy and axillary lymph node dissection 02/04/13 followed by chemotherapy and radiation to left breast and axilla. She has struggled significantly with bilateral feet neuropathy, anxiety and depression. History includes of rotator cuf surgery on left arm    Patient Stated Goals to have my breasts feel some sort of normal   Currently in Pain? No/denies            [facility] PT Assessment - 05/10/16 0001  AROM   Left Shoulder Flexion 161 Degrees   Left Shoulder ABduction 170 Degrees                     [facility] Adult PT Treatment/Exercise - 05/10/16 0001    Shoulder Exercises: Therapy Ball   Flexion 10 reps  With forward lean at top of stretch   ABduction 10 reps  Lt UE with Lt side lean into top of stretch   Manual Therapy   Soft tissue mobilization Tigger point release to Lt axilla at incision with Biotone.   Myofascial Release Lt UE pulling with trigger point release mostly into abduction                   Short Term Clinic Goals - 03/15/16 1346    CC  Short Term Goal  #1   Title Pt to be able to verbalize lymphedema risk reduction practices   Status Achieved   CC Short Term Goal  #2   Title Pt to report decreased pain of less than 3/10 to improve comfort and allow increased use of LUE  Pt reports "it hasn't really bothered me much the last few days".    Status Achieved             Long Term Clinic Goals - 05/10/16 1603    CC Long Term Goal  #1   Title Pt to demonstrate 155 degrees of left shoulder flexion to allow pt to reach items overhead   Baseline 140, 160 degrees with assist from therapist to decrease shoulder hike 03/15/16, 145 degrees 03/22/16, 156 degrees 04/12/16, 150 degrees 04/19/16, 160 on 05/02/2016, 161 degrees 05/10/16   Status Achieved   CC Long Term Goal  #2   Title Pt to have appropriate compression bra for long term management of edema   Status Achieved   CC Long Term Goal  #3   Title Pt will report a 50% decrease in breast lymphedema symptoms   Status Achieved   CC Long Term Goal  #4   Title Pt will report decreased pain of less than 2/10 to allow improved use of LUE   Status Achieved   CC Long Term Goal  #5   Title Pt will have an improved LLIS impairment score of less than 30% to allow improved use of LUE   Status Partially Met            Plan - 05/10/16 1605    Clinical Impression Statement Pt hasn't had the cramping sensation she experienced in her upper arm for about 4 or 5 days now. She tolerated manual therapy well though is still very tight in area of axilla especially at her incision.    Rehab Potential Good   Clinical Impairments Affecting Rehab Potential previous radiation,CIPN and breast lymphedema    PT Frequency 3x / week   PT Duration 8 weeks   PT Treatment/Interventions Manual techniques;Passive range of motion;Manual lymph drainage;Therapeutic exercise   PT Next Visit Plan  continue with exercises especailly scapular strengthening ,  scap mobs, and myofascial to anterior and posterior  axilla  Assess goals   Consulted and Agree with Plan of Care Patient      Patient will benefit from skilled therapeutic intervention in order to improve the following deficits and impairments:  Pain, Impaired UE functional use, Decreased strength, Increased edema  Visit Diagnosis: Lymphedema, not elsewhere classified  Stiffness of left shoulder, not elsewhere classified  Pain in left shoulder  Problem List Patient Active Problem List   Diagnosis Date Noted  . Generalized anxiety disorder 03/14/2016    Class: Chronic  . Hyperglycemia 01/09/2016  . Chemotherapy-induced peripheral neuropathy ([place]) 09/21/2015  . History of chicken pox   . Shingles 05/15/2014  . Anxiety and depression 05/15/2014  . Rotator cuff tear 02/24/2014  . Hot flashes due to tamoxifen 02/20/2014  . Left shoulder pain with abduction 12/12/2013  . CVA (cerebral infarction) 07/18/2013  . TIA (transient ischemic attack) 07/18/2013  . Contracture of axilla 05/20/2013  . Primary cancer of upper inner quadrant of left female breast ([place]) 01/24/2013  . Anal fissure 05/03/2011  . Hemorrhoids, internal, with prolapse 05/03/2011    Otelia Limes, PTA 05/10/2016, 4:06 PM  [place], [name], [address] Phone: [phone]   Fax:  [phone]  Name: Kristi [name] MRN: [id number] Date of Birth: 1956/04/27

## 2016-05-12 ENCOUNTER — Encounter: Payer: Self-pay | Admitting: Physical Therapy

## 2016-05-16 ENCOUNTER — Telehealth: Payer: Self-pay | Admitting: Hematology and Oncology

## 2016-05-16 ENCOUNTER — Encounter: Payer: Self-pay | Admitting: Hematology and Oncology

## 2016-05-16 ENCOUNTER — Ambulatory Visit (HOSPITAL_BASED_OUTPATIENT_CLINIC_OR_DEPARTMENT_OTHER): Payer: BC Managed Care – PPO | Admitting: Hematology and Oncology

## 2016-05-16 VITALS — BP 115/73 | HR 88 | Temp 98.7°F | Resp 18 | Ht 62.0 in | Wt 176.7 lb

## 2016-05-16 DIAGNOSIS — F329 Major depressive disorder, single episode, unspecified: Secondary | ICD-10-CM

## 2016-05-16 DIAGNOSIS — C50212 Malignant neoplasm of upper-inner quadrant of left female breast: Secondary | ICD-10-CM

## 2016-05-16 DIAGNOSIS — G620 Drug-induced polyneuropathy: Secondary | ICD-10-CM | POA: Diagnosis not present

## 2016-05-16 DIAGNOSIS — C773 Secondary and unspecified malignant neoplasm of axilla and upper limb lymph nodes: Secondary | ICD-10-CM

## 2016-05-16 DIAGNOSIS — Z170 Estrogen receptor positive status [ER+]: Secondary | ICD-10-CM

## 2016-05-16 DIAGNOSIS — I890 Lymphedema, not elsewhere classified: Secondary | ICD-10-CM

## 2016-05-16 MED ORDER — GABAPENTIN 800 MG PO TABS: 800.0000 mg | ORAL_TABLET | Freq: Three times a day (TID) | ORAL | Status: DC

## 2016-05-16 NOTE — Telephone Encounter (Signed)
appt made and avs printed °

## 2016-05-16 NOTE — Assessment & Plan Note (Signed)
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 followed by Taxol carboplatin later switched to [place] completed 08/21/2013, status post adjuvant radiation, currently on tamoxifen started 11/13/2013  Left breast lymphedema: received physical therapy Neuropathy related to prior chemotherapy: under neurology care. Her symptoms are not getting any better with time.  Major depression: Patient is seeing psychiatry. She stopped tamoxifen for 3 months. Her depression symptoms have markedly improved. I recommended resuming tamoxifen therapy. If her depression gets worse. We can switch her to anastrozole.  Breast Cancer Surveillance: 1. Breast exam 05/16/2016: Normal 2. Mammogram 02/01/2016 No abnormalities. Postsurgical changes. Breast Density Category B. I recommended that she get 3-D mammograms for surveillance. Discussed the differences between different breast density categories.  Return to clinic in 1 year for follow-up.

## 2016-05-16 NOTE — Progress Notes (Signed)
Patient Care Team: Mosie Lukes, MD as PCP - General (Family Medicine) Nicholas Lose, MD as Consulting Physician (Hematology and Oncology) Johnn Hai, MD as Referring Physician (Psychiatry) Lin Landsman, MD as Consulting Physician (Family Medicine) Inocencio Homes, DPM as Consulting Physician (Podiatry) Gery Pray, MD as Consulting Physician (Radiation Oncology)  DIAGNOSIS: Primary cancer of upper inner quadrant of left female breast [facility])   Staging form: Breast, AJCC 7th Edition     Clinical: Stage IA (T1c, N0, cM0) - Unsigned       Staging comments: Staged at breast conference 3.26.14      Pathologic: No stage assigned - Unsigned   SUMMARY OF ONCOLOGIC HISTORY:   Primary cancer of upper inner quadrant of left female breast ([place])   01/24/2013 Initial Diagnosis Ultrasound: Left breast no position 1 x 0.7 cm, 12:00 position 1.6 x 0.9 cm in addition 2.1 cm axillary lymph node: Biopsies of breast and axilla were positive for IDC high-grade;  ER weakly positive, PR negative, HER-2 negative   02/04/2013 Surgery Left breast lumpectomy: 1.6 cm tumor 1/19 axillary lymph nodes positive, ER 6%, PR 0%, HER-2 negative, Ki-67 90%   03/05/2013 - 08/21/2013 Chemotherapy Adjuvant chemotherapy with Adriamycin and Cytoxan followed by Taxol and [place] x9 followed by [place] x2   09/09/2013 - 10/28/2013 Radiation Therapy Adjuvant radiation therapy   11/13/2013 -  Anti-estrogen oral therapy Tamoxifen 20 mg daily switched to anastrozole May 2017    CHIEF COMPLIANT: Follow-up on anastrozole therapy   INTERVAL HISTORY: Kristi [name] is a 60 year old with above-mentioned history of left breast cancer currently on adjuvant therapy with antiestrogen treatment. She was on tamoxifen initially but it was switched to anastrozole because it was making her depression worse. Since she switched to anastrozole, she is doing a lot better. She does complain of musculoskeletal aches and pains. She denies any hot  flashes.  REVIEW OF SYSTEMS:   Constitutional: Denies fevers, chills or abnormal weight loss Eyes: Denies blurriness of vision Ears, nose, mouth, throat, and face: Denies mucositis or sore throat Respiratory: Denies cough, dyspnea or wheezes Cardiovascular: Denies palpitation, chest discomfort Gastrointestinal:  Denies nausea, heartburn or change in bowel habits Skin: Denies abnormal skin rashes Lymphatics: Denies new lymphadenopathy or easy bruising Neurological:Denies numbness, tingling or new weaknesses Behavioral/Psych: Mood is stable, no new changes  Extremities: Bilateral knee arthritis Breast:  denies any pain or lumps or nodules in either breasts All other systems were reviewed with the patient and are negative.  I have reviewed the past medical history, past surgical history, social history and family history with the patient and they are unchanged from previous note.  ALLERGIES:  has No Known Allergies.  MEDICATIONS:  Current Outpatient Prescriptions  Medication Sig Dispense Refill  . ALPRAZolam (XANAX) 1 MG tablet Reported on 12/27/2015    . amitriptyline (ELAVIL) 25 MG tablet Take 4 tablets (100 mg total) by mouth at bedtime. 30 tablet 1  . anastrozole (ARIMIDEX) 1 MG tablet Take 1 tablet (1 mg total) by mouth daily. 90 tablet 3  . Ascorbic Acid (VITAMIN C) 1000 MG tablet Take 1,000 mg by mouth daily.    Marland Kitchen aspirin EC 81 MG tablet Take 162 mg by mouth daily.    . B Complex-C (SUPER B COMPLEX PO) Take 1 tablet by mouth daily.    . clonazePAM (KLONOPIN) 1 MG tablet Take 1 tablet (1 mg total) by mouth 3 (three) times daily. 90 tablet 0  . Eszopiclone (ESZOPICLONE) 3 MG TABS Take  3 mg by mouth at bedtime. Take immediately before bedtime    . gabapentin (NEURONTIN) 800 MG tablet Take 1 tablet (800 mg total) by mouth 3 (three) times daily. 90 tablet 2  . KLOR-CON M20 20 MEQ tablet TAKE 2 TABLETS FOR 3 DAYS, THEN TAKE ONCE A DAY 35 tablet 1  . Multiple Vitamin (MULTIVITAMIN WITH  MINERALS) TABS tablet Take 1 tablet by mouth daily.    . naproxen (NAPROSYN) 500 MG tablet Take 1 tablet (500 mg total) by mouth 2 (two) times daily. (Patient not taking: Reported on 03/09/2016) 10 tablet 0  . valsartan-hydrochlorothiazide (DIOVAN-HCT) 320-25 MG tablet TAKE 1 TABLET BY MOUTH EVERY MORNING. 30 tablet 2  . [DISCONTINUED] prochlorperazine (COMPAZINE) 10 MG tablet Take 1 tablet (10 mg total) by mouth every 6 (six) hours as needed (Nausea or vomiting). 30 tablet 1  . [DISCONTINUED] prochlorperazine (COMPAZINE) 25 MG suppository Place 1 suppository (25 mg total) rectally every 12 (twelve) hours as needed for nausea. 12 suppository 3   No current facility-administered medications for this visit.    PHYSICAL EXAMINATION: ECOG PERFORMANCE STATUS: 1 - Symptomatic but completely ambulatory  Filed Vitals:   05/16/16 1129  BP: 115/73  Pulse: 88  Temp: 98.7 F (37.1 C)  Resp: 18   Filed Weights   05/16/16 1129  Weight: 176 lb 11.2 oz (80.151 kg)    GENERAL:alert, no distress and comfortable SKIN: skin color, texture, turgor are normal, no rashes or significant lesions EYES: normal, Conjunctiva are pink and non-injected, sclera clear OROPHARYNX:no exudate, no erythema and lips, buccal mucosa, and tongue normal  NECK: supple, thyroid normal size, non-tender, without nodularity LYMPH:  no palpable lymphadenopathy in the cervical, axillary or inguinal LUNGS: clear to auscultation and percussion with normal breathing effort HEART: regular rate & rhythm and no murmurs and no lower extremity edema ABDOMEN:abdomen soft, non-tender and normal bowel sounds MUSCULOSKELETAL:Arthritis in the knees  NEURO: alert & oriented x 3 with fluent speech, no focal motor/sensory deficits EXTREMITIES: No lower extremity edema BREAST: No palpable masses or nodules in either right or left breasts. No palpable axillary supraclavicular or infraclavicular adenopathy no breast tenderness or nipple discharge.  (exam performed in the presence of a chaperone)  LABORATORY DATA:  I have reviewed the data as listed   Chemistry      Component Value Date/Time   NA 142 01/04/2016 0915   NA 141 09/01/2014 1525   K 3.9 01/04/2016 0915   K 3.6 09/01/2014 1525   CL 104 01/04/2016 0915   CL 100 04/23/2013 1447   CO2 32 01/04/2016 0915   CO2 28 09/01/2014 1525   BUN 11 01/04/2016 0915   BUN 13.3 09/01/2014 1525   CREATININE 0.85 01/04/2016 0915   CREATININE 0.8 09/01/2014 1525      Component Value Date/Time   CALCIUM 9.4 01/04/2016 0915   CALCIUM 9.4 09/01/2014 1525   ALKPHOS 45 01/04/2016 0915   ALKPHOS 62 09/01/2014 1525   AST 19 01/04/2016 0915   AST 19 09/01/2014 1525   ALT 15 01/04/2016 0915   ALT 17 09/01/2014 1525   BILITOT 0.5 01/04/2016 0915   BILITOT 0.22 09/01/2014 1525       Lab Results  Component Value Date   WBC 5.3 12/27/2015   HGB 11.3* 12/27/2015   HCT 34.6* 12/27/2015   MCV 89.0 12/27/2015   PLT 160.0 12/27/2015   NEUTROABS 2.0 09/01/2014     ASSESSMENT & PLAN:  Primary cancer of upper inner quadrant of left  female breast Multifocal invasive ductal carcinoma grade 3 ER weakly positive at 3% PR negative HER-2/neu negative with Ki-67 of 90% status post lumpectomy 02/04/2013 1.6 and the tumor one out of 19 lymph nodes positive status post adjuvant a.c. followed by Taxol carboplatin later switched to [place] completed 08/21/2013, status post adjuvant radiation, currently on tamoxifen started 11/13/2013 Switched to anastrozole 03/10/2016   Left breast lymphedema: received physical therapy Neuropathy related to prior chemotherapy: under neurology care. Her symptoms are not getting any better with time.  Major depression: Patient is seeing psychiatry. She stopped tamoxifen for 3 months. We switched her to anastrozole and this appears to be better tolerated. Her depression symptoms have markedly improved.   Anastrozole toxicities: 1. Musculoskeletal aches and  pains especially in the knees: Patient is Trying to do more activity to help alleviate the symptoms. 2. Denies any major problems with depression. Denies any hot flashes.   Breast Cancer Surveillance: 1. Breast exam 05/16/2016: Normal 2. Mammogram 02/01/2016 No abnormalities. Postsurgical changes. Breast Density Category B. I recommended that she get 3-D mammograms for surveillance. Discussed the differences between different breast density categories.   Return to clinic in 1 year for follow-up.   No orders of the defined types were placed in this encounter.   The patient has a good understanding of the overall plan. she agrees with it. she will call with any problems that may develop before the next visit here.   Rulon Eisenmenger, MD 05/16/2016

## 2016-05-16 NOTE — Addendum Note (Signed)
Addended by: Cheree Ditto on: 05/16/2016 02:36 PM   Modules accepted: Orders, Medications

## 2016-05-17 ENCOUNTER — Ambulatory Visit: Payer: BC Managed Care – PPO

## 2016-05-17 ENCOUNTER — Encounter: Payer: Self-pay | Admitting: Hematology and Oncology

## 2016-05-17 DIAGNOSIS — M25612 Stiffness of left shoulder, not elsewhere classified: Secondary | ICD-10-CM | POA: Diagnosis not present

## 2016-05-17 DIAGNOSIS — M25512 Pain in left shoulder: Secondary | ICD-10-CM

## 2016-05-17 DIAGNOSIS — I890 Lymphedema, not elsewhere classified: Secondary | ICD-10-CM | POA: Diagnosis not present

## 2016-05-17 NOTE — Progress Notes (Signed)
left in pod-patient gave to dr.

## 2016-05-17 NOTE — Progress Notes (Signed)
Received disability forms from Dr. [name].  Signed in and given to managed care per pod folder.

## 2016-05-17 NOTE — Therapy (Signed)
[place], [name], [address] Phone: [phone]   Fax:  [phone]  Physical Therapy Treatment  Patient Details  Name: Kristi [name] MRN: [id number] Date of Birth: 1956/01/20 Referring Provider: Barry Dienes  Encounter Date: 05/17/2016      PT End of Session - 05/17/16 1343    Visit Number 19   Number of Visits 32   Date for PT Re-Evaluation 05/22/16   PT Start Time 1025   PT Stop Time 1347   PT Time Calculation (min) 51 min   Activity Tolerance Patient tolerated treatment well   Behavior During Therapy [facility] for tasks assessed/performed      Past Medical History  Diagnosis Date  . Anxiety   . Hypertension   . MRSA (methicillin resistant Staphylococcus aureus) 2009    right groin area-no issues now. 04-07-14 PCR screen negative today.  . Depression   . Anemia     Iron deficinecy anemia  . History of radiation therapy 09/09/13-10/28/13    45 gray to left breast, lumpectomy cavity boosted to 63 gray  . Neuropathy ([place])   . Arthritis   . History of chicken pox   . Breast cancer ([place])     left ,last radiation 2'15, last chemo 8'14  . Anxiety and depression 05/15/2014  . Hyperglycemia 01/09/2016    Past Surgical History  Procedure Laterality Date  . Anal sphincterotomy  04/2011  . Hemorrhoid surgery  04/2011    ligation  . Breast lumpectomy with needle localization Left 02/04/2013    Procedure: LEFT BREAST LUMPECTOMY WITH NEEDLE LOCALIZATION;  Surgeon: Stark Klein, MD;  Location: [place];  Service: General;  Laterality: Left;  . Axillary lymph node dissection Left 02/04/2013    Procedure: LEFT AXILLARY LYMPH NODE DISSECTION;  Surgeon: Stark Klein, MD;  Location: [place];  Service: General;  Laterality: Left;  End: 8527  . Portacath placement Right 02/04/2013    Procedure: INSERTION PORT-A-CATH;  Surgeon: Stark Klein, MD;  Location: [place];  Service: General;  Laterality: Right;  Start Time: 7824.  Marland Kitchen  Appendectomy  1980  . Breast surgery      Lumpectomy in april 2014  . Shoulder arthroscopy with rotator cuff repair and subacromial decompression Left 02/24/2014    Procedure: SHOULDER ARTHROSCOPY WITH ROTATOR CUFF REPAIR AND SUBACROMIAL DECOMPRESSION;  Surgeon: Meredith Pel, MD;  Location: [place];  Service: Orthopedics;  Laterality: Left;  LEFT SHOULDER DIAGNOSTIC OPERATIVE ARTHROSCOPY, SUBACROMIAL DECOMPRESSION, ROTATOR CUFF TEAR REPAIR  . Port-a-cath removal N/A 04/16/2014    Procedure: REMOVAL PORT-A-CATH;  Surgeon: Stark Klein, MD;  Location: [facility];  Service: General;  Laterality: N/A;    There were no vitals filed for this visit.      Subjective Assessment - 05/17/16 1301    Subjective I'm feeling pretty good today! My Lt shoulder gives me fits some days but overall it is definitely better than it was. I had a regular check up with Dr. [name] and he said he doesn't need to see me back for a year!   Pertinent History Left lumpectomy and axillary lymph node dissection 02/04/13 followed by chemotherapy and radiation to left breast and axilla. She has struggled significantly with bilateral feet neuropathy, anxiety and depression. History includes of rotator cuf surgery on left arm    Patient Stated Goals to have my breasts feel some sort of normal   Currently in Pain? No/denies            [facility]  PT Assessment - 05/17/16 0001    AROM   Left Shoulder Flexion 160 Degrees  at end of session after stretching; was 154 degrees prior to                     [facility] Adult PT Treatment/Exercise - 05/17/16 0001    Shoulder Exercises: Pulleys   Flexion 2 minutes   Flexion Limitations Tactile cuing throughout to decrease Lt shoulder hike   ABduction 2 minutes   Shoulder Exercises: Therapy Ball   Flexion 10 reps  With forward lean at top of stretch   ABduction 10 reps  Lt UE with side lean into top of stretch   Shoulder Exercises: ROM/Strengthening   "W" Arms 5 times but  stopped as pt was c/o some pain in Lt shoulder joint with this   Shoulder Exercises: Stretch   Corner Stretch 5 reps;10 seconds   Manual Therapy   Soft tissue mobilization Tigger point release to Lt axilla at incision with Biotone.   Myofascial Release Lt UE pulling with trigger point release mostly into abduction   Passive ROM To left shoulder in supine for ER, abduction, and flexion with gentle stretch                   Short Term Clinic Goals - 03/15/16 1346    CC Short Term Goal  #1   Title Pt to be able to verbalize lymphedema risk reduction practices   Status Achieved   CC Short Term Goal  #2   Title Pt to report decreased pain of less than 3/10 to improve comfort and allow increased use of LUE  Pt reports "it hasn't really bothered me much the last few days".    Status Achieved             Long Term Clinic Goals - 05/17/16 1351    CC Long Term Goal  #1   Title Pt to demonstrate 155 degrees of left shoulder flexion to allow pt to reach items overhead   Baseline 140, 160 degrees with assist from therapist to decrease shoulder hike 03/15/16, 145 degrees 03/22/16, 156 degrees 04/12/16, 150 degrees 04/19/16, 160 on 05/02/2016, 161 degrees 05/10/16, 160 degrees 05/17/16   Status Achieved   CC Long Term Goal  #2   Title Pt to have appropriate compression bra for long term management of edema   Status Achieved   CC Long Term Goal  #3   Title Pt will report a 50% decrease in breast lymphedema symptoms   Status Achieved   CC Long Term Goal  #4   Title Pt will report decreased pain of less than 2/10 to allow improved use of LUE   Baseline 4, 1/10 today and pt reports last few days it "hasn't hurt her much, she hasn't really noticed it (pain)" , been 2/10 or less past few days, 0-1/10 since last visit 04/19/16   Status Achieved   CC Long Term Goal  #5   Title Pt will have an improved LLIS impairment score of less than 30% to allow improved use of LUE   Baseline 61% impairment,  31% limited at this time 04/12/16   Status Partially Met            Plan - 05/17/16 1347    Clinical Impression Statement Pt reports her Lt breast swelling is well controlled now with use of her compression pump 2x/day and wear of her compression bra. Pt continues to do well  with therapy. She has one more visit this week and then her cert is up. Spoke with pt regarding any deficits she feels she has at home and she feels at this time that she is doing well and is not hindered by her A/ROM with any of her ADLs. She feels "on the fence" about D/C next visit so encouraged pt to really focus on seeing if she has any deficits with ADLs next 2 days and if not then maybe that would help her realize she is ready to be D/C. Pt verbalized understanding this and agreed.    Rehab Potential Good   Clinical Impairments Affecting Rehab Potential previous radiation,CIPN and breast lymphedema    PT Frequency 3x / week   PT Duration 8 weeks   PT Treatment/Interventions Manual techniques;Passive range of motion;Manual lymph drainage;Therapeutic exercise   PT Next Visit Plan  Possible D/C next visit if pt agreeable, though she may also want to cont 1x/week a few more weeks. Have pt retake LLIS as this is the only goal she has yet to meet. continue with exercises especailly scapular strengthening ,  scap mobs, and myofascial to anterior and posterior axilla  Assess goals   Consulted and Agree with Plan of Care Patient      Patient will benefit from skilled therapeutic intervention in order to improve the following deficits and impairments:  Pain, Impaired UE functional use, Decreased strength, Increased edema  Visit Diagnosis: Lymphedema, not elsewhere classified  Stiffness of left shoulder, not elsewhere classified  Pain in left shoulder     Problem List Patient Active Problem List   Diagnosis Date Noted  . Generalized anxiety disorder 03/14/2016    Class: Chronic  . Hyperglycemia 01/09/2016  .  Chemotherapy-induced peripheral neuropathy ([place]) 09/21/2015  . History of chicken pox   . Shingles 05/15/2014  . Anxiety and depression 05/15/2014  . Rotator cuff tear 02/24/2014  . Hot flashes due to tamoxifen 02/20/2014  . Left shoulder pain with abduction 12/12/2013  . CVA (cerebral infarction) 07/18/2013  . TIA (transient ischemic attack) 07/18/2013  . Contracture of axilla 05/20/2013  . Primary cancer of upper inner quadrant of left female breast ([place]) 01/24/2013  . Anal fissure 05/03/2011  . Hemorrhoids, internal, with prolapse 05/03/2011    Otelia Limes, PTA 05/17/2016, 1:53 PM  [place], [name], [address] Phone: [phone]   Fax:  [phone]  Name: Kristi [name] MRN: [id number] Date of Birth: 1956/10/05

## 2016-05-19 ENCOUNTER — Encounter: Payer: Self-pay | Admitting: Physical Therapy

## 2016-05-19 ENCOUNTER — Ambulatory Visit: Payer: BC Managed Care – PPO | Admitting: Physical Therapy

## 2016-05-19 DIAGNOSIS — I890 Lymphedema, not elsewhere classified: Secondary | ICD-10-CM | POA: Diagnosis not present

## 2016-05-19 DIAGNOSIS — M25512 Pain in left shoulder: Secondary | ICD-10-CM

## 2016-05-19 DIAGNOSIS — M25612 Stiffness of left shoulder, not elsewhere classified: Secondary | ICD-10-CM | POA: Diagnosis not present

## 2016-05-19 NOTE — Therapy (Addendum)
[place], [name], [address] Phone: [phone]   Fax:  [phone]  Physical Therapy Treatment  Patient Details  Name: Kristi [name] MRN: [id number] Date of Birth: [birth year] Referring Provider: Barry Dienes  Encounter Date: 05/19/2016      PT End of Session - 05/19/16 1201    Visit Number 20   Number of Visits 32   Date for PT Re-Evaluation 05/22/16   PT Start Time 1110   PT Stop Time 1153   PT Time Calculation (min) 43 min   Activity Tolerance Patient tolerated treatment well   Behavior During Therapy [facility] for tasks assessed/performed      Past Medical History  Diagnosis Date  . Anxiety   . Hypertension   . MRSA (methicillin resistant Staphylococcus aureus) 2009    right groin area-no issues now. 04-07-14 PCR screen negative today.  . Depression   . Anemia     Iron deficinecy anemia  . History of radiation therapy 09/09/13-10/28/13    45 gray to left breast, lumpectomy cavity boosted to 63 gray  . Neuropathy ([place])   . Arthritis   . History of chicken pox   . Breast cancer ([place])     left ,last radiation 2'15, last chemo 8'14  . Anxiety and depression 05/15/2014  . Hyperglycemia 01/09/2016    Past Surgical History  Procedure Laterality Date  . Anal sphincterotomy  04/2011  . Hemorrhoid surgery  04/2011    ligation  . Breast lumpectomy with needle localization Left 02/04/2013    Procedure: LEFT BREAST LUMPECTOMY WITH NEEDLE LOCALIZATION;  Surgeon: Stark Klein, MD;  Location: [place];  Service: General;  Laterality: Left;  . Axillary lymph node dissection Left 02/04/2013    Procedure: LEFT AXILLARY LYMPH NODE DISSECTION;  Surgeon: Stark Klein, MD;  Location: [place];  Service: General;  Laterality: Left;  End: 7026  . Portacath placement Right 02/04/2013    Procedure: INSERTION PORT-A-CATH;  Surgeon: Stark Klein, MD;  Location: [place];  Service: General;  Laterality: Right;  Start Time: 3785.  Marland Kitchen  Appendectomy  1980  . Breast surgery      Lumpectomy in april 2014  . Shoulder arthroscopy with rotator cuff repair and subacromial decompression Left 02/24/2014    Procedure: SHOULDER ARTHROSCOPY WITH ROTATOR CUFF REPAIR AND SUBACROMIAL DECOMPRESSION;  Surgeon: Meredith Pel, MD;  Location: [place];  Service: Orthopedics;  Laterality: Left;  LEFT SHOULDER DIAGNOSTIC OPERATIVE ARTHROSCOPY, SUBACROMIAL DECOMPRESSION, ROTATOR CUFF TEAR REPAIR  . Port-a-cath removal N/A 04/16/2014    Procedure: REMOVAL PORT-A-CATH;  Surgeon: Stark Klein, MD;  Location[facility];  Service: General;  Laterality: N/A;    There were no vitals filed for this visit.      Subjective Assessment - 05/19/16 1113    Subjective My breast is a little tender. I used the machine before I came.    Pertinent History Left lumpectomy and axillary lymph node dissection 02/04/13 followed by chemotherapy and radiation to left breast and axilla. She has struggled significantly with bilateral feet neuropathy, anxiety and depression. History includes of rotator cuf surgery on left arm    Patient Stated Goals to have my breasts feel some sort of normal   Currently in Pain? Yes   Pain Score 1    Pain Location Breast   Pain Orientation Left   Pain Descriptors / Indicators Sore   Pain Type Chronic pain            [facility] PT  Assessment - 05/19/16 0001    Observation/Other Assessments   Other Surveys  --  LLIS: 28% impairment                     [facility] Adult PT Treatment/Exercise - 05/19/16 0001    Shoulder Exercises: Pulleys   Flexion 2 minutes   Flexion Limitations Tactile cuing throughout to decrease Lt shoulder hike   ABduction 2 minutes   Shoulder Exercises: Therapy Ball   Flexion 10 reps  With forward lean at top of stretch   ABduction 10 reps  Lt UE with side lean into top of stretch   Shoulder Exercises: Stretch   Corner Stretch 5 reps;10 seconds   Manual Therapy   Myofascial Release Lt UE pulling  with trigger point release mostly into abduction   Passive ROM To left shoulder in supine for ER, abduction, and flexion with gentle stretch                   Short Term Clinic Goals - 03/15/16 1346    CC Short Term Goal  #1   Title Pt to be able to verbalize lymphedema risk reduction practices   Status Achieved   CC Short Term Goal  #2   Title Pt to report decreased pain of less than 3/10 to improve comfort and allow increased use of LUE  Pt reports "it hasn't really bothered me much the last few days".    Status Achieved             Long Term Clinic Goals - 05/19/16 1115    CC Long Term Goal  #1   Title Pt to demonstrate 155 degrees of left shoulder flexion to allow pt to reach items overhead   Status Achieved   CC Long Term Goal  #2   Title Pt to have appropriate compression bra for long term management of edema   Status Achieved   CC Long Term Goal  #3   Title Pt will report a 50% decrease in breast lymphedema symptoms   Status Achieved   CC Long Term Goal  #4   Title Pt will report decreased pain of less than 2/10 to allow improved use of LUE   Status Achieved   CC Long Term Goal  #5   Title Pt will have an improved LLIS impairment score of less than 30% to allow improved use of LUE   Baseline 61% impairment, 31% limited at this time 04/12/16, 05/19/16- 28%   Status Achieved            Plan - 05/19/16 1209    Clinical Impression Statement Pt reports she has been managing her left breast swelling with use of compression pump and compression bra. She is independent with her home exercise program. She is not having any limitations with her ADLs secondary to decreased ROM of LUE. At this point pt is ready to be discharged from skilled PT services.    Rehab Potential Good   Clinical Impairments Affecting Rehab Potential previous radiation,CIPN and breast lymphedema    PT Frequency 3x / week   PT Duration 8 weeks   PT Treatment/Interventions Manual  techniques;Passive range of motion;Manual lymph drainage;Therapeutic exercise   PT Next Visit Plan d/c this visit   PT Home Exercise Plan Roll ball up wall for flexion and abduction; wear new compression bras to assess comfort   Consulted and Agree with Plan of Care Patient      Patient will  benefit from skilled therapeutic intervention in order to improve the following deficits and impairments:  Pain, Impaired UE functional use, Decreased strength, Increased edema  Visit Diagnosis: Lymphedema, not elsewhere classified  Stiffness of left shoulder, not elsewhere classified  Pain in left shoulder       G-Codes - 2016/06/09 1213    Functional Assessment Tool Used LLIS   Functional Limitation Other PT primary   Other PT Primary Goal Status (U0233) At least 20 percent but less than 40 percent impaired, limited or restricted   Other PT Primary Discharge Status (I3568) At least 20 percent but less than 40 percent impaired, limited or restricted      Problem List Patient Active Problem List   Diagnosis Date Noted  . Generalized anxiety disorder 03/14/2016    Class: Chronic  . Hyperglycemia 01/09/2016  . Chemotherapy-induced peripheral neuropathy ([place]) 09/21/2015  . History of chicken pox   . Shingles 05/15/2014  . Anxiety and depression 05/15/2014  . Rotator cuff tear 02/24/2014  . Hot flashes due to tamoxifen 02/20/2014  . Left shoulder pain with abduction 12/12/2013  . CVA (cerebral infarction) 07/18/2013  . TIA (transient ischemic attack) 07/18/2013  . Contracture of axilla 05/20/2013  . Primary cancer of upper inner quadrant of left female breast ([place]) 01/24/2013  . Anal fissure 05/03/2011  . Hemorrhoids, internal, with prolapse 05/03/2011    Alexia Freestone 06/09/16, 12:14 PM  [place], [name], [address] Phone: [phone]   Fax:  [phone]  Name: KYMBERLY BLOMBERG MRN:  [id number] Date of Birth: 10/17/1956    Allyson Sabal, PT 2016-06-09 12:14 PM  PHYSICAL THERAPY DISCHARGE SUMMARY  Visits from Start of Care: 20  Current functional level related to goals / functional outcomes: Pt is able to independently manage her edema and she is independent with a home exercise program. She is able to complete all ADLs with no limitations   Remaining deficits: none   Education / Equipment: HEP Plan: Patient agrees to discharge.  Patient goals were met. Patient is being discharged due to meeting the stated rehab goals.  ?????

## 2016-05-22 ENCOUNTER — Encounter: Payer: Self-pay | Admitting: Hematology and Oncology

## 2016-05-22 NOTE — Progress Notes (Signed)
left in pod-patient gave to dr.- faxed [phone]-sent to medical recds-mailed to patient

## 2016-06-12 ENCOUNTER — Other Ambulatory Visit: Payer: Self-pay | Admitting: Medical

## 2016-06-13 ENCOUNTER — Other Ambulatory Visit: Payer: Self-pay | Admitting: Family Medicine

## 2016-06-26 ENCOUNTER — Encounter: Payer: Medicare Other | Admitting: Family Medicine

## 2016-07-03 DIAGNOSIS — F332 Major depressive disorder, recurrent severe without psychotic features: Secondary | ICD-10-CM | POA: Diagnosis not present

## 2016-07-12 ENCOUNTER — Encounter: Payer: Self-pay | Admitting: Medical

## 2016-07-12 ENCOUNTER — Ambulatory Visit (HOSPITAL_BASED_OUTPATIENT_CLINIC_OR_DEPARTMENT_OTHER)
Admission: RE | Admit: 2016-07-12 | Discharge: 2016-07-12 | Disposition: A | Discharge status: Home / Self Care | Payer: BC Managed Care – PPO | Source: Ambulatory Visit | Attending: Medical | Admitting: Medical

## 2016-07-12 ENCOUNTER — Telehealth: Payer: Self-pay | Admitting: Medical

## 2016-07-12 ENCOUNTER — Ambulatory Visit (INDEPENDENT_AMBULATORY_CARE_PROVIDER_SITE_OTHER): Payer: BC Managed Care – PPO | Admitting: Medical

## 2016-07-12 VITALS — BP 128/79 | HR 81 | Temp 98.4°F | Ht 62.0 in | Wt 180.0 lb

## 2016-07-12 DIAGNOSIS — M5441 Lumbago with sciatica, right side: Secondary | ICD-10-CM | POA: Insufficient documentation

## 2016-07-12 DIAGNOSIS — M5136 Other intervertebral disc degeneration, lumbar region: Secondary | ICD-10-CM | POA: Insufficient documentation

## 2016-07-12 DIAGNOSIS — M1711 Unilateral primary osteoarthritis, right knee: Secondary | ICD-10-CM | POA: Insufficient documentation

## 2016-07-12 DIAGNOSIS — M179 Osteoarthritis of knee, unspecified: Secondary | ICD-10-CM | POA: Diagnosis not present

## 2016-07-12 DIAGNOSIS — M5431 Sciatica, right side: Secondary | ICD-10-CM | POA: Diagnosis not present

## 2016-07-12 DIAGNOSIS — M25569 Pain in unspecified knee: Secondary | ICD-10-CM | POA: Diagnosis not present

## 2016-07-12 DIAGNOSIS — M25562 Pain in left knee: Secondary | ICD-10-CM | POA: Diagnosis not present

## 2016-07-12 DIAGNOSIS — G629 Polyneuropathy, unspecified: Secondary | ICD-10-CM

## 2016-07-12 DIAGNOSIS — D259 Leiomyoma of uterus, unspecified: Secondary | ICD-10-CM | POA: Insufficient documentation

## 2016-07-12 MED ORDER — DICLOFENAC SODIUM 50 MG PO TBEC: 50.0000 mg | DELAYED_RELEASE_TABLET | Freq: Two times a day (BID) | ORAL | 0 refills | Status: DC

## 2016-07-12 MED ORDER — VALSARTAN-HYDROCHLOROTHIAZIDE 320-25 MG PO TABS: 1.0000 | ORAL_TABLET | Freq: Every morning | ORAL | 2 refills | Status: DC

## 2016-07-12 NOTE — Patient Instructions (Addendum)
For your knee pain will get xrays on both sides. Low dose rx of diclofenac. While on no otc nsaids such as ibuprofen or alleve.  For sciatic type pain/lower back pain  which is new discussed and decided to get lumbar xray.  For neuropathy continue current gabapentin and elavil rx. Will send Dr. Charlett Blake message to see if other med can be given to supplement.  Follow up 10-14 days or as needed

## 2016-07-12 NOTE — Progress Notes (Signed)
Subjective:    Patient ID: Kristi [name], female    DOB: Mar 15, 1956, 60 y.o.   MRN: [id number]  HPI   Pt in with knee pain for about a month. Knee pain is new. Both sides knee pain. RT side greater than LT side. Hurts to bend her knees.   Pt has lower extremity/feet neuropathy for years after chemotherapy. Pt is on high dose neurontin.  Pt pain level about 5/10 rt knee but when she goes up steps pain is worse.  Pt on review did report some mild faint pain in lower back. Just noticed past couple of weeks. Faint low level pain for couple of hours in am. She is restricted and has to lay on rt side due to lymphadema in are. Associated with surgery for breast CA.  Pt bp is good today. CMP on review shows good GFR. No hx of gerd or ulcers.   Review of Systems  Constitutional: Negative for chills, fatigue and fever.  Respiratory: Negative for cough, chest tightness, shortness of breath and wheezing.   Cardiovascular: Negative for chest pain and palpitations.  Musculoskeletal: Positive for back pain.       Rt sciatica area faint pain past 2 weeks.(new pain.)pt above 70 yo.) Knee pain.  Neurological:       Neuropathy.(feet). Hx of in the past.  Hematological: Negative for adenopathy. Does not bruise/bleed easily.  Psychiatric/Behavioral: Negative for behavioral problems, confusion and dysphoric mood.    Past Medical History:  Diagnosis Date  . Anemia    Iron deficinecy anemia  . Anxiety   . Anxiety and depression 05/15/2014  . Arthritis   . Breast cancer ([place])    left ,last radiation 2'15, last chemo 8'14  . Depression   . History of chicken pox   . History of radiation therapy 09/09/13-10/28/13   45 gray to left breast, lumpectomy cavity boosted to 63 gray  . Hyperglycemia 01/09/2016  . Hypertension   . MRSA (methicillin resistant Staphylococcus aureus) 2009   right groin area-no issues now. 04-07-14 PCR screen negative today.  . Neuropathy [facility])      Social History   Social  History  . Marital status: Married    Spouse name: [name]  . Number of children: 1  . Years of education: 82   Occupational History  . [employer]   Social History Main Topics  . Smoking status: Never Smoker  . Smokeless tobacco: Never Used  . Alcohol use No  . Drug use: No  . Sexual activity: No     Comment: lives with husband, disability/retirement. [employer], no dietary restrictions   Other Topics Concern  . Not on file   Social History Narrative   Patient is married [name]) and lives at home with her husband.   Patient has one daughter.   Patient is working at [employer]   Patient has a 12th grade education.   Patient drinks very little caffeine.    Past Surgical History:  Procedure Laterality Date  . ANAL SPHINCTEROTOMY  04/2011  . APPENDECTOMY  1980  . AXILLARY LYMPH NODE DISSECTION Left 02/04/2013   Procedure: LEFT AXILLARY LYMPH NODE DISSECTION;  Surgeon: Stark Klein, MD;  Location: [place];  Service: General;  Laterality: Left;  End: [id number]  . BREAST LUMPECTOMY WITH NEEDLE LOCALIZATION Left 02/04/2013   Procedure: LEFT BREAST LUMPECTOMY WITH NEEDLE LOCALIZATION;  Surgeon: Stark Klein, MD;  Location: [place];  Service: General;  Laterality: Left;  .  BREAST SURGERY     Lumpectomy in april 2014  . HEMORRHOID SURGERY  04/2011   ligation  . PORT-A-CATH REMOVAL N/A 04/16/2014   Procedure: REMOVAL PORT-A-CATH;  Surgeon: Stark Klein, MD;  [facility];  Service: General;  Laterality: N/A;  . PORTACATH PLACEMENT Right 02/04/2013   Procedure: INSERTION PORT-A-CATH;  Surgeon: Stark Klein, MD;  Location: [place];  Service: General;  Laterality: Right;  Start Time: [id number].  Marland Kitchen SHOULDER ARTHROSCOPY WITH ROTATOR CUFF REPAIR AND SUBACROMIAL DECOMPRESSION Left 02/24/2014   Procedure: SHOULDER ARTHROSCOPY WITH ROTATOR CUFF REPAIR AND SUBACROMIAL DECOMPRESSION;  Surgeon: Meredith Pel, MD;  Location: [place];  Service: Orthopedics;  Laterality: Left;  LEFT  SHOULDER DIAGNOSTIC OPERATIVE ARTHROSCOPY, SUBACROMIAL DECOMPRESSION, ROTATOR CUFF TEAR REPAIR    Family History  Problem Relation Age of Onset  . Lung cancer Father   . Hypertension Father   . Thyroid cancer Father     dx in his 66s  . Cancer Father     lung, thyroid, smoker  . Breast cancer Paternal Aunt 47  . Colon cancer Paternal Aunt     dx in her 50x  . Cervical cancer Paternal Aunt     dzx in her 80s  . Ovarian cancer Cousin     dx in her lage 14s  . Breast cancer Cousin     maternal first cousin, once removed; dx in her late 75s  . Breast cancer Cousin     maternal first cousin once removed; dx in late 66s  . Hypertension Mother   . Diabetes Mother   . Dementia Mother   . Hypertension Brother   . Seizures Brother     Alcohol induced.  . Alcohol abuse Brother     drinker, smoker  . Cancer Paternal Uncle     oral cancer  . Kidney cancer Paternal Grandmother   . Cancer Cousin     several paternal cousins with brain cancer, leukemia, and other cancers  . Arthritis Daughter     back surgery    No Known Allergies  Current Outpatient Prescriptions on File Prior to Visit  Medication Sig Dispense Refill  . amitriptyline (ELAVIL) 25 MG tablet Take 4 tablets (100 mg total) by mouth at bedtime. 30 tablet 1  . anastrozole (ARIMIDEX) 1 MG tablet Take 1 tablet (1 mg total) by mouth daily. 90 tablet 3  . Ascorbic Acid (VITAMIN C) 1000 MG tablet Take 1,000 mg by mouth daily.    Marland Kitchen aspirin EC 81 MG tablet Take 162 mg by mouth daily.    . B Complex-C (SUPER B COMPLEX PO) Take 1 tablet by mouth daily.    . clonazePAM (KLONOPIN) 1 MG tablet Take 1 tablet (1 mg total) by mouth 3 (three) times daily. 90 tablet 0  . Eszopiclone (ESZOPICLONE) 3 MG TABS Take 3 mg by mouth at bedtime. Take immediately before bedtime    . gabapentin (NEURONTIN) 800 MG tablet Take 1 tablet (800 mg total) by mouth 3 (three) times daily. 90 tablet 3  . KLOR-CON M20 20 MEQ tablet TAKE 2 TABLETS BY MOUTH  EVERY DAY FOR 3 DAYS, THEN TAKE 1 TABLET EVERY DAY 35 tablet 1  . Multiple Vitamin (MULTIVITAMIN WITH MINERALS) TABS tablet Take 1 tablet by mouth daily.    . valsartan-hydrochlorothiazide (DIOVAN-HCT) 320-25 MG tablet TAKE 1 TABLET BY MOUTH EVERY MORNING 30 tablet 2  . [DISCONTINUED] prochlorperazine (COMPAZINE) 10 MG tablet Take 1 tablet (10 mg total) by mouth every 6 (six) hours  as needed (Nausea or vomiting). 30 tablet 1  . [DISCONTINUED] prochlorperazine (COMPAZINE) 25 MG suppository Place 1 suppository (25 mg total) rectally every 12 (twelve) hours as needed for nausea. 12 suppository 3   No current facility-administered medications on file prior to visit.     BP 128/79   Pulse 81   Temp 98.4 F (36.9 C) (Oral)   Ht 5\' 2"  (1.575 m)   Wt 180 lb (81.6 kg)   LMP 01/20/2013   SpO2 98%   BMI 32.92 kg/m      Objective:   Physical Exam  General Appearance- Not in acute distress.    Chest and Lung Exam Auscultation: Breath sounds:-Normal. Clear even and unlabored. Adventitious sounds:- No Adventitious sounds.  Cardiovascular Auscultation:Rythm - Regular, rate and rythm. Heart Sounds -Normal heart sounds.  Abdomen Inspection:-Inspection Normal.  Palpation/Perucssion: Palpation and Percussion of the abdomen reveal- Non Tender, No Rebound tenderness, No rigidity(Guarding) and No Palpable abdominal masses.  Liver:-Normal.  Spleen:- Normal.   Back Mid lumbar spine no  tenderness to palpation. Pain on straight leg lift.(faint rt side). Pain rt si area. Pain on lateral movements and flexion/extension of the spine.  Lower ext neurologic  L5-S1 sensation intact bilaterally. Normal patellar reflexes bilaterally. No foot drop bilaterally.  Knees- bilateral moderate crepitus on flexion and extension. No instability. No redness or warmth.       Assessment & Plan:  For your knee pain will get xrays on both sides. Low dose rx of diclofenac. While on no otc nsaids such as  ibuprofen or alleve.  For sciatic type pain/lower back pain  which is new discussed and decided to get lumbar xray.  For neuropathy continue current gabapentin and elavil rx. Will send Dr. Charlett Blake message to see if other med can be given to supplement.  Follow up 10-14 days or as needed   Cyndi Montejano, Percell Miller, [employer]

## 2016-07-12 NOTE — Progress Notes (Signed)
Pre visit review using our clinic tool,if applicable. No additional management support is needed unless otherwise documented below in the visit note.  

## 2016-07-12 NOTE — Telephone Encounter (Signed)
Would you mind looking at my note. I was wondering what else you think may be added for her neuropathic/neuropathy of lower ext. Gabapentin high dose presently. 800 mg tid. On one tab elevavil at night. Wanted your opinion before adding anything. Not sure.

## 2016-07-12 NOTE — Addendum Note (Signed)
Addended by: Anabel Halon on: 07/12/2016 09:01 AM   Modules accepted: Orders

## 2016-07-13 NOTE — Telephone Encounter (Signed)
Tough one. If she has any sense of RLS symptoms contributing then could add some Requip 0.5 mg qhs and have her follow up in 6 weeks. Or if no rls symptoms could increase Elavil to 50 mg qhs if she is not having any concerning side effects.

## 2016-07-16 NOTE — Telephone Encounter (Signed)
Let pt know that I have I did sed Dr Charlett Blake message regarding your nerve pain. She had possible recommendation of increasing elavil to 50 mg at night. But I want to clarify her instruction on our med list states 4 tab(25 mg) by mouth at bed time which would be 100 mg at night. This is a high dose. Is she taking eleval like this? Let me know what she says? Sounds like Dr. Charlett Blake though she was taking only  25 mg at night.

## 2016-07-17 DIAGNOSIS — F332 Major depressive disorder, recurrent severe without psychotic features: Secondary | ICD-10-CM | POA: Diagnosis not present

## 2016-07-18 ENCOUNTER — Other Ambulatory Visit: Payer: Self-pay | Admitting: Medical

## 2016-07-19 NOTE — Telephone Encounter (Signed)
Called patient who states she is taking 100 mg Elavil at bedtime which was prescribed by Dr. Farris Has her psychiatrists assistant.

## 2016-07-19 NOTE — Telephone Encounter (Signed)
Then advise pt no changes. Since she is already taking 100 mg of elavil a day. That can't be increased.  But also double check that she is not having any restless leg type symptoms. If she has restless/fidgety movements to legs could try requip.

## 2016-07-24 DIAGNOSIS — F330 Major depressive disorder, recurrent, mild: Secondary | ICD-10-CM | POA: Diagnosis not present

## 2016-07-26 ENCOUNTER — Ambulatory Visit (INDEPENDENT_AMBULATORY_CARE_PROVIDER_SITE_OTHER): Payer: BC Managed Care – PPO | Admitting: Medical

## 2016-07-26 ENCOUNTER — Encounter: Payer: Self-pay | Admitting: Medical

## 2016-07-26 VITALS — BP 102/70 | HR 95 | Temp 98.2°F | Ht 62.0 in | Wt 175.0 lb

## 2016-07-26 DIAGNOSIS — M25569 Pain in unspecified knee: Secondary | ICD-10-CM

## 2016-07-26 DIAGNOSIS — M5431 Sciatica, right side: Secondary | ICD-10-CM | POA: Diagnosis not present

## 2016-07-26 MED ORDER — DICLOFENAC SODIUM 75 MG PO TBEC: 75.0000 mg | DELAYED_RELEASE_TABLET | Freq: Two times a day (BID) | ORAL | 0 refills | Status: DC

## 2016-07-26 NOTE — Progress Notes (Signed)
Subjective:    Patient ID: Kristi [name], female    DOB: June 01, 1956, 60 y.o.   MRN: [id number]  HPI  Pt in for follow up.  She had some knee pain. Pt states diclofenac seemed to help some. Pt level of pain has come down. Pt had some degenerative changes on her rt knee. Questionable small  Effusion left side.  Pt neuropathy pain in lower ex/feet currently controlled by elavil.  Pt denies any rls type symptoms.   Pt states her lower back pain is still faint. On exam some faint rt si area pain.     Review of Systems  Constitutional: Negative for chills, fatigue and fever.  Respiratory: Negative for cough, choking, shortness of breath and wheezing.   Cardiovascular: Negative for palpitations.  Gastrointestinal: Negative for abdominal pain.  Musculoskeletal:       See hpi  Skin: Negative for rash.  Hematological: Negative for adenopathy. Does not bruise/bleed easily.    Past Medical History:  Diagnosis Date  . Anemia    Iron deficinecy anemia  . Anxiety   . Anxiety and depression 05/15/2014  . Arthritis   . Breast cancer ([place])    left ,last radiation 2'15, last chemo 8'14  . Depression   . History of chicken pox   . History of radiation therapy 09/09/13-10/28/13   45 gray to left breast, lumpectomy cavity boosted to 63 gray  . Hyperglycemia 01/09/2016  . Hypertension   . MRSA (methicillin resistant Staphylococcus aureus) 2009   right groin area-no issues now. 04-07-14 PCR screen negative today.  . Neuropathy [facility])      Social History   Social History  . Marital status: Married    Spouse name: [name]  . Number of children: 1  . Years of education: 13   Occupational History  . [employer]   Social History Main Topics  . Smoking status: Never Smoker  . Smokeless tobacco: Never Used  . Alcohol use No  . Drug use: No  . Sexual activity: No     Comment: lives with husband, disability/retirement. [employer], no dietary  restrictions   Other Topics Concern  . Not on file   Social History Narrative   Patient is married [name]) and lives at home with her husband.   Patient has one daughter.   Patient is working at [employer]   Patient has a 12th grade education.   Patient drinks very little caffeine.    Past Surgical History:  Procedure Laterality Date  . ANAL SPHINCTEROTOMY  04/2011  . APPENDECTOMY  1980  . AXILLARY LYMPH NODE DISSECTION Left 02/04/2013   Procedure: LEFT AXILLARY LYMPH NODE DISSECTION;  Surgeon: Stark Klein, MD;  Location: [place];  Service: General;  Laterality: Left;  End: [id number]  . BREAST LUMPECTOMY WITH NEEDLE LOCALIZATION Left 02/04/2013   Procedure: LEFT BREAST LUMPECTOMY WITH NEEDLE LOCALIZATION;  Surgeon: Stark Klein, MD;  Location: [place];  Service: General;  Laterality: Left;  . BREAST SURGERY     Lumpectomy in april 2014  . HEMORRHOID SURGERY  04/2011   ligation  . PORT-A-CATH REMOVAL N/A 04/16/2014   Procedure: REMOVAL PORT-A-CATH;  Surgeon: Stark Klein, MD;  [facility];  Service: General;  Laterality: N/A;  . PORTACATH PLACEMENT Right 02/04/2013   Procedure: INSERTION PORT-A-CATH;  Surgeon: Stark Klein, MD;  Location: [place];  Service: General;  Laterality: Right;  Start Time: [id number].  Marland Kitchen SHOULDER ARTHROSCOPY WITH ROTATOR CUFF REPAIR AND  SUBACROMIAL DECOMPRESSION Left 02/24/2014   Procedure: SHOULDER ARTHROSCOPY WITH ROTATOR CUFF REPAIR AND SUBACROMIAL DECOMPRESSION;  Surgeon: Meredith Pel, MD;  Location: [place];  Service: Orthopedics;  Laterality: Left;  LEFT SHOULDER DIAGNOSTIC OPERATIVE ARTHROSCOPY, SUBACROMIAL DECOMPRESSION, ROTATOR CUFF TEAR REPAIR    Family History  Problem Relation Age of Onset  . Lung cancer Father   . Hypertension Father   . Thyroid cancer Father     dx in his 34s  . Cancer Father     lung, thyroid, smoker  . Breast cancer Paternal Aunt 104  . Colon cancer Paternal Aunt     dx in her 50x  . Cervical cancer Paternal Aunt     dzx in her 55s  .  Ovarian cancer Cousin     dx in her lage 35s  . Breast cancer Cousin     maternal first cousin, once removed; dx in her late 71s  . Breast cancer Cousin     maternal first cousin once removed; dx in late 24s  . Hypertension Mother   . Diabetes Mother   . Dementia Mother   . Hypertension Brother   . Seizures Brother     Alcohol induced.  . Alcohol abuse Brother     drinker, smoker  . Cancer Paternal Uncle     oral cancer  . Kidney cancer Paternal Grandmother   . Cancer Cousin     several paternal cousins with brain cancer, leukemia, and other cancers  . Arthritis Daughter     back surgery    No Known Allergies  Current Outpatient Prescriptions on File Prior to Visit  Medication Sig Dispense Refill  . amitriptyline (ELAVIL) 25 MG tablet Take 4 tablets (100 mg total) by mouth at bedtime. 30 tablet 1  . anastrozole (ARIMIDEX) 1 MG tablet Take 1 tablet (1 mg total) by mouth daily. 90 tablet 3  . Ascorbic Acid (VITAMIN C) 1000 MG tablet Take 1,000 mg by mouth daily.    Marland Kitchen aspirin EC 81 MG tablet Take 162 mg by mouth daily.    . B Complex-C (SUPER B COMPLEX PO) Take 1 tablet by mouth daily.    . clonazePAM (KLONOPIN) 1 MG tablet Take 1 tablet (1 mg total) by mouth 3 (three) times daily. 90 tablet 0  . Eszopiclone (ESZOPICLONE) 3 MG TABS Take 3 mg by mouth at bedtime. Take immediately before bedtime    . gabapentin (NEURONTIN) 800 MG tablet Take 1 tablet (800 mg total) by mouth 3 (three) times daily. 90 tablet 3  . KLOR-CON M20 20 MEQ tablet TAKE 2 TABLETS BY MOUTH EVERY DAY FOR 3 DAYS, THEN TAKE 1 TABLET EVERY DAY 35 tablet 1  . Lurasidone HCl (LATUDA) 60 MG TABS Take 60 mg by mouth daily.    . Multiple Vitamin (MULTIVITAMIN WITH MINERALS) TABS tablet Take 1 tablet by mouth daily.    . valsartan-hydrochlorothiazide (DIOVAN-HCT) 320-25 MG tablet Take 1 tablet by mouth every morning. 30 tablet 2  . diclofenac (VOLTAREN) 50 MG EC tablet Take 1 tablet (50 mg total) by mouth 2 (two) times  daily. (Patient not taking: Reported on 07/26/2016) 20 tablet 0  . [DISCONTINUED] prochlorperazine (COMPAZINE) 10 MG tablet Take 1 tablet (10 mg total) by mouth every 6 (six) hours as needed (Nausea or vomiting). 30 tablet 1  . [DISCONTINUED] prochlorperazine (COMPAZINE) 25 MG suppository Place 1 suppository (25 mg total) rectally every 12 (twelve) hours as needed for nausea. 12 suppository 3   No current facility-administered  medications on file prior to visit.     BP 102/70   Pulse 95   Temp 98.2 F (36.8 C) (Oral)   Ht 5\' 2"  (1.575 m)   Wt 175 lb (79.4 kg)   LMP 01/20/2013   SpO2 97%   BMI 32.01 kg/m       Objective:   Physical Exam  General Appearance- Not in acute distress.    Chest and Lung Exam Auscultation: Breath sounds:-Normal. Clear even and unlabored. Adventitious sounds:- No Adventitious sounds.  Cardiovascular Auscultation:Rythm - Regular, rate and rythm. Heart Sounds -Normal heart sounds.  Abdomen Inspection:-Inspection Normal.  Palpation/Perucssion: Palpation and Percussion of the abdomen reveal- Non Tender, No Rebound tenderness, No rigidity(Guarding) and No Palpable abdominal masses.  Liver:-Normal.  Spleen:- Normal.   Back Mid lumbar spine no  tenderness to palpation. Pain on straight leg lift.(faint rt side). Faint pain rt si area on palpation. Pain on lateral movements and flexion/extension of the spine.    Knees- bilateral moderate crepitus on flexion and extension. No instability. No redness or warmth.       Assessment & Plan:  For your knee pain will make diclofenac available for intermittent pain. If pain becomes daily let me know and will refer to orthopedist.  For sciatica conservative stretching exercise for back and diclofenac. If mild daily pain can offer PT. If pain radiating down leg then could consider mri of lumbar spine.  Continue elavil for your neuropathy.  Follow up as regularly scheduled with pcp or as needed  with me.

## 2016-07-26 NOTE — Patient Instructions (Signed)
For your knee pain will make diclofenac available for intermittent pain. If pain becomes daily let me know and will refer to orthopedist.  For sciatica conservative stretching exercise for back and diclofenac. If mild daily pain can offer PT. If pain radiating down leg then could consider mri of lumbar spine.  Continue elavil for your neuropathy.  Follow up as regularly scheduled with pcp or as needed with me.

## 2016-07-31 DIAGNOSIS — F330 Major depressive disorder, recurrent, mild: Secondary | ICD-10-CM | POA: Diagnosis not present

## 2016-08-02 ENCOUNTER — Telehealth: Payer: Self-pay | Admitting: Medical

## 2016-08-02 NOTE — Telephone Encounter (Signed)
How does pt feel. Does she fel like needs refill of diclofenac? If so let me know.

## 2016-08-02 NOTE — Telephone Encounter (Signed)
Called patient . States she does not need Diclofenac at this time. Will call back when she feels she needs it.

## 2016-08-17 ENCOUNTER — Other Ambulatory Visit: Payer: Self-pay | Admitting: Family Medicine

## 2016-08-17 DIAGNOSIS — F331 Major depressive disorder, recurrent, moderate: Secondary | ICD-10-CM | POA: Diagnosis not present

## 2016-08-25 ENCOUNTER — Other Ambulatory Visit: Payer: Self-pay | Admitting: Family Medicine

## 2016-08-29 DIAGNOSIS — F331 Major depressive disorder, recurrent, moderate: Secondary | ICD-10-CM | POA: Diagnosis not present

## 2016-09-14 ENCOUNTER — Ambulatory Visit: Payer: Self-pay | Admitting: Radiation Oncology

## 2016-09-26 ENCOUNTER — Other Ambulatory Visit: Payer: Self-pay | Admitting: Family Medicine

## 2016-09-26 MED ORDER — VALSARTAN-HYDROCHLOROTHIAZIDE 320-25 MG PO TABS: 1.0000 | ORAL_TABLET | Freq: Every morning | ORAL | 2 refills | Status: DC

## 2016-09-26 NOTE — Telephone Encounter (Signed)
Patient is requesting a refill of diclofenac (VOLTAREN) 75 MG EC tablet and  valsartan-hydrochlorothiazide (DIOVAN-HCT) 320-25 MG tablet   Pharmacy: [id number] - [place], [place]

## 2016-10-02 DIAGNOSIS — F331 Major depressive disorder, recurrent, moderate: Secondary | ICD-10-CM | POA: Diagnosis not present

## 2016-10-02 MED ORDER — DICLOFENAC SODIUM 75 MG PO TBEC: 75.0000 mg | DELAYED_RELEASE_TABLET | Freq: Two times a day (BID) | ORAL | 0 refills | Status: DC

## 2016-10-02 NOTE — Telephone Encounter (Deleted)
Rx filled for pt.

## 2016-10-12 ENCOUNTER — Ambulatory Visit
Admission: RE | Admit: 2016-10-12 | Discharge: 2016-10-12 | Disposition: A | Discharge status: Home / Self Care | Payer: BC Managed Care – PPO | Source: Ambulatory Visit | Attending: Radiation Oncology | Admitting: Radiation Oncology

## 2016-10-12 ENCOUNTER — Encounter: Payer: Self-pay | Admitting: Radiation Oncology

## 2016-10-12 DIAGNOSIS — C50912 Malignant neoplasm of unspecified site of left female breast: Secondary | ICD-10-CM | POA: Diagnosis present

## 2016-10-12 DIAGNOSIS — Z79811 Long term (current) use of aromatase inhibitors: Secondary | ICD-10-CM | POA: Diagnosis not present

## 2016-10-12 DIAGNOSIS — N6459 Other signs and symptoms in breast: Secondary | ICD-10-CM | POA: Diagnosis not present

## 2016-10-12 DIAGNOSIS — Z79899 Other long term (current) drug therapy: Secondary | ICD-10-CM | POA: Diagnosis not present

## 2016-10-12 DIAGNOSIS — Z7982 Long term (current) use of aspirin: Secondary | ICD-10-CM | POA: Diagnosis not present

## 2016-10-12 DIAGNOSIS — Z08 Encounter for follow-up examination after completed treatment for malignant neoplasm: Secondary | ICD-10-CM | POA: Diagnosis not present

## 2016-10-12 DIAGNOSIS — C50412 Malignant neoplasm of upper-outer quadrant of left female breast: Secondary | ICD-10-CM | POA: Diagnosis not present

## 2016-10-12 DIAGNOSIS — C50212 Malignant neoplasm of upper-inner quadrant of left female breast: Secondary | ICD-10-CM

## 2016-10-12 DIAGNOSIS — Z923 Personal history of irradiation: Secondary | ICD-10-CM | POA: Insufficient documentation

## 2016-10-12 NOTE — Progress Notes (Signed)
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  Narrative:  The patient returns today for routine follow-up. She reports having soreness in the outer/side portion of her left breast. She reports her arthritis medication helps alleviate this pain. She reports that her energy level is low. She is taking Arimidex. Thee skin on her left breast is intact. She continues to have some edema in her left breast.  ALLERGIES:  has No Known Allergies.  Meds: Current Outpatient Prescriptions  Medication Sig Dispense Refill  . amitriptyline (ELAVIL) 25 MG tablet Take 4 tablets (100 mg total) by mouth at bedtime. 30 tablet 1  . anastrozole (ARIMIDEX) 1 MG tablet Take 1 tablet (1 mg total) by mouth daily. 90 tablet 3  . Ascorbic Acid (VITAMIN C) 1000 MG tablet Take 1,000 mg by mouth daily.    Marland Kitchen aspirin EC 81 MG tablet Take 162 mg by mouth daily.    . B Complex-C (SUPER B COMPLEX PO) Take 1 tablet by mouth daily.    . clonazePAM (KLONOPIN) 1 MG tablet Take 1 tablet (1 mg total) by mouth 3 (three) times daily. 90 tablet 0  . diclofenac (VOLTAREN) 75 MG EC tablet Take 1 tablet (75 mg total) by mouth 2 (two) times daily. 30 tablet 0  . gabapentin (NEURONTIN) 800 MG tablet Take 1 tablet (800 mg total) by mouth 3 (three) times daily. 90 tablet 3  . KLOR-CON M20 20 MEQ tablet TAKE 2 TABLETS BY MOUTH EVERY DAY FOR 3 DAYS, THEN TAKE 1 TABLET EVERY DAY 35 tablet 1  . KLOR-CON M20 20 MEQ tablet TAKE 2 TABLETS BY MOUTH EVERY DAY FOR 3 DAYS, THEN TAKE 1 TABLET EVERY DAY  35 tablet 1  . Lurasidone HCl (LATUDA) 60 MG TABS Take 60 mg by mouth daily.    . Multiple Vitamin (MULTIVITAMIN WITH MINERALS) TABS tablet Take 1 tablet by mouth daily.    . valsartan-hydrochlorothiazide (DIOVAN-HCT) 320-25 MG tablet Take 1 tablet by mouth every morning. 30 tablet 2  . Eszopiclone (ESZOPICLONE) 3 MG TABS Take 3 mg by mouth at bedtime. Take immediately before bedtime     No current facility-administered medications for this encounter.     Physical Findings: Vitals:   10/12/16 0927  BP: 110/67  Pulse: 95  Temp: 98.1 F (36.7 C)   The patient is in no acute distress. Patient is alert and oriented. Lungs are clear to auscultation bilaterally. Heart has regular rate and rhythm. No palpable cervical, supraclavicular, or axillary adenopathy. Mild hyperpigmentation changes in left breast, and less edema compared to previous exam. No palpable mass or nipple discharge.   Lab Findings: Lab Results  Component Value Date   WBC 5.3 12/27/2015   HGB 11.3 (L) 12/27/2015   HCT 34.6 (L) 12/27/2015   MCV 89.0 12/27/2015   PLT 160.0 12/27/2015    Radiographic Findings: The mammogram at [facility] on 02/01/2016 shows no evidence of recurrence.  Impression:  No evidence of recurrence on clinical exam today. Continued edema of the left breast but better by  my exam. Patient's pump that she uses on the breast seems to have reduced some of the swelling. The patient is more comfortable.  Plan:  Follow up prn with radiation oncology. The patient will continues to follow up with medical oncology and surgery. ____________________________________  Blair Promise, PhD, MD    This document serves as a record of services personally performed by Gery Pray, MD. It was created on his behalf by Bethann Humble, a trained medical scribe. The creation of this record is based on the scribe's personal observations and the provider's statements to them. This document has been checked and approved by the  attending provider.

## 2016-10-12 NOTE — Progress Notes (Signed)
Kristi [name] is here for follow up after treatment to her left breast.  She reports having soreness in the outer/side portion of her left breast.  She reports that her energy level is low.  She is taking Arimidex.  The skin on her left breast is intact.  She continues to have some edema in her left breast.  BP 110/67 (BP Location: Right Arm, Patient Position: Sitting)   Pulse 95   Temp 98.1 F (36.7 C) (Oral)   Ht 5\' 2"  (1.575 m)   Wt 182 lb 9.6 oz (82.8 kg)   LMP 01/20/2013   SpO2 100%   BMI 33.40 kg/m    Wt Readings from Last 3 Encounters:  10/12/16 182 lb 9.6 oz (82.8 kg)  07/26/16 175 lb (79.4 kg)  07/12/16 180 lb (81.6 kg)

## 2016-10-15 ENCOUNTER — Other Ambulatory Visit: Payer: Self-pay | Admitting: Medical

## 2016-10-31 ENCOUNTER — Encounter: Payer: Medicare Other | Admitting: Family Medicine

## 2016-11-02 ENCOUNTER — Other Ambulatory Visit: Payer: Self-pay | Admitting: Nurse Practitioner

## 2016-11-03 ENCOUNTER — Other Ambulatory Visit: Payer: Self-pay | Admitting: Hematology and Oncology

## 2016-11-03 DIAGNOSIS — C50212 Malignant neoplasm of upper-inner quadrant of left female breast: Secondary | ICD-10-CM

## 2016-11-13 ENCOUNTER — Encounter: Payer: Self-pay | Admitting: Family Medicine

## 2016-11-13 ENCOUNTER — Ambulatory Visit (INDEPENDENT_AMBULATORY_CARE_PROVIDER_SITE_OTHER): Payer: BC Managed Care – PPO | Admitting: Family Medicine

## 2016-11-13 VITALS — BP 112/80 | HR 79 | Temp 97.5°F | Ht 62.0 in | Wt 186.0 lb

## 2016-11-13 DIAGNOSIS — I10 Essential (primary) hypertension: Secondary | ICD-10-CM

## 2016-11-13 DIAGNOSIS — G8929 Other chronic pain: Secondary | ICD-10-CM

## 2016-11-13 DIAGNOSIS — M25562 Pain in left knee: Secondary | ICD-10-CM

## 2016-11-13 DIAGNOSIS — R2232 Localized swelling, mass and lump, left upper limb: Secondary | ICD-10-CM

## 2016-11-13 DIAGNOSIS — Z0001 Encounter for general adult medical examination with abnormal findings: Secondary | ICD-10-CM | POA: Diagnosis not present

## 2016-11-13 DIAGNOSIS — K5900 Constipation, unspecified: Secondary | ICD-10-CM | POA: Diagnosis not present

## 2016-11-13 DIAGNOSIS — M25551 Pain in right hip: Secondary | ICD-10-CM | POA: Diagnosis not present

## 2016-11-13 DIAGNOSIS — R739 Hyperglycemia, unspecified: Secondary | ICD-10-CM

## 2016-11-13 DIAGNOSIS — Z0000 Encounter for general adult medical examination without abnormal findings: Secondary | ICD-10-CM

## 2016-11-13 DIAGNOSIS — M25561 Pain in right knee: Secondary | ICD-10-CM | POA: Diagnosis not present

## 2016-11-13 HISTORY — DX: Essential (primary) hypertension: I10

## 2016-11-13 HISTORY — DX: Encounter for general adult medical examination without abnormal findings: Z00.00

## 2016-11-13 HISTORY — DX: Constipation, unspecified: K59.00

## 2016-11-13 NOTE — Progress Notes (Signed)
Pre visit review using our clinic review tool, if applicable. No additional management support is needed unless otherwise documented below in the visit note. 

## 2016-11-13 NOTE — Assessment & Plan Note (Signed)
Encouraged increased hydration and fiber in diet. Daily probiotics. If bowels not moving can use MOM 2 tbls po in 4 oz of warm prune juice by mouth every 2-3 days. If no results then repeat in 4 hours with  Dulcolax suppository pr, may repeat again in 4 more hours as needed. Seek care if symptoms worsen. Consider daily Miralax and/or Dulcolax if symptoms persist.  

## 2016-11-13 NOTE — Patient Instructions (Addendum)
Encouraged increased hydration and fiber in diet. Daily probiotics. If bowels not moving can use MOM 2 tbls po in 4 oz of warm prune juice by mouth every 2-3 days. If no results then repeat in 4 hours with  Dulcolax suppository pr, may repeat again in 4 more hours as needed. Seek care if symptoms worsen. Consider daily Miralax and/or Dulcolax if symptoms persist.   NOW probiotic daily at Kristi [name] Years, Female Preventive care refers to lifestyle choices and visits with your health care provider that can promote health and wellness. What does preventive care include?  A yearly physical exam. This is also called an annual well check.  Dental exams once or twice a year.  Routine eye exams. Ask your health care provider how often you should have your eyes checked.  Personal lifestyle choices, including:  Daily care of your teeth and gums.  Regular physical activity.  Eating a healthy diet.  Avoiding tobacco and drug use.  Limiting alcohol use.  Practicing safe sex.  Taking low-dose aspirin daily starting at age 44.  Taking vitamin and mineral supplements as recommended by your health care provider. What happens during an annual well check? The services and screenings done by your health care provider during your annual well check will depend on your age, overall health, lifestyle risk factors, and family history of disease. Counseling  Your health care provider may ask you questions about your:  Alcohol use.  Tobacco use.  Drug use.  Emotional well-being.  Home and relationship well-being.  Sexual activity.  Eating habits.  Work and work Statistician.  Method of birth control.  Menstrual cycle.  Pregnancy history. Screening  You may have the following tests or measurements:  Height, weight, and BMI.  Blood pressure.  Lipid and cholesterol levels. These may be checked every 5 years, or more frequently if you are over 72  years old.  Skin check.  Lung cancer screening. You may have this screening every year starting at age 32 if you have a 30-pack-year history of smoking and currently smoke or have quit within the past 15 years.  Fecal occult blood test (FOBT) of the stool. You may have this test every year starting at age 54.  Flexible sigmoidoscopy or colonoscopy. You may have a sigmoidoscopy every 5 years or a colonoscopy every 10 years starting at age 68.  Hepatitis C blood test.  Hepatitis B blood test.  Sexually transmitted disease (STD) testing.  Diabetes screening. This is done by checking your blood sugar (glucose) after you have not eaten for a while (fasting). You may have this done every 1-3 years.  Mammogram. This may be done every 1-2 years. Talk to your health care provider about when you should start having regular mammograms. This may depend on whether you have a family history of breast cancer.  BRCA-related cancer screening. This may be done if you have a family history of breast, ovarian, tubal, or peritoneal cancers.  Pelvic exam and Pap test. This may be done every 3 years starting at age 5. Starting at age 70, this may be done every 5 years if you have a Pap test in combination with an HPV test.  Bone density scan. This is done to screen for osteoporosis. You may have this scan if you are at high risk for osteoporosis. Discuss your test results, treatment options, and if necessary, the need for more tests with your health care provider. Vaccines  Your health care  provider may recommend certain vaccines, such as:  Influenza vaccine. This is recommended every year.  Tetanus, diphtheria, and acellular pertussis (Tdap, Td) vaccine. You may need a Td booster every 10 years.  Varicella vaccine. You may need this if you have not been vaccinated.  Zoster vaccine. You may need this after age 34.  Measles, mumps, and rubella (MMR) vaccine. You may need at least one dose of MMR if you  were born in 1957 or later. You may also need a second dose.  Pneumococcal 13-valent conjugate (PCV13) vaccine. You may need this if you have certain conditions and were not previously vaccinated.  Pneumococcal polysaccharide (PPSV23) vaccine. You may need one or two doses if you smoke cigarettes or if you have certain conditions.  Meningococcal vaccine. You may need this if you have certain conditions.  Hepatitis A vaccine. You may need this if you have certain conditions or if you travel or work in places where you may be exposed to hepatitis A.  Hepatitis B vaccine. You may need this if you have certain conditions or if you travel or work in places where you may be exposed to hepatitis B.  Haemophilus influenzae type b (Hib) vaccine. You may need this if you have certain conditions. Talk to your health care provider about which screenings and vaccines you need and how often you need them. This information is not intended to replace advice given to you by your health care provider. Make sure you discuss any questions you have with your health care provider. Document Released: 11/19/2015 Document Revised: 07/12/2016 Document Reviewed: 08/24/2015 Elsevier Interactive Patient Education  2017 [employer].

## 2016-11-14 DIAGNOSIS — M25561 Pain in right knee: Secondary | ICD-10-CM | POA: Insufficient documentation

## 2016-11-14 DIAGNOSIS — M25562 Pain in left knee: Secondary | ICD-10-CM

## 2016-11-14 DIAGNOSIS — G8929 Other chronic pain: Secondary | ICD-10-CM

## 2016-11-14 DIAGNOSIS — M25551 Pain in right hip: Secondary | ICD-10-CM | POA: Insufficient documentation

## 2016-11-14 DIAGNOSIS — R2232 Localized swelling, mass and lump, left upper limb: Secondary | ICD-10-CM

## 2016-11-14 HISTORY — DX: Pain in right knee: M25.561

## 2016-11-14 HISTORY — DX: Localized swelling, mass and lump, left upper limb: R22.32

## 2016-11-14 HISTORY — DX: Pain in right hip: M25.551

## 2016-11-14 LAB — LIPID PANEL
CHOL/HDL RATIO: 3
Cholesterol: 180 mg/dL (ref 0–200)
HDL: 53.2 mg/dL (ref >39.00)
LDL CALC: 113 mg/dL — AB (ref 0–99)
NONHDL: 126.99
TRIGLYCERIDES: 71 mg/dL (ref 0.0–149.0)
VLDL: 14.2 mg/dL (ref 0.0–40.0)

## 2016-11-14 LAB — COMPREHENSIVE METABOLIC PANEL
ALT: 17 U/L (ref 0–35)
AST: 21 U/L (ref 0–37)
Albumin: 4.2 g/dL (ref 3.5–5.2)
Alkaline Phosphatase: 71 U/L (ref 39–117)
BUN: 17 mg/dL (ref 6–23)
CALCIUM: 10.5 mg/dL (ref 8.4–10.5)
CO2: 32 meq/L (ref 19–32)
CREATININE: 1.04 mg/dL (ref 0.40–1.20)
Chloride: 103 mEq/L (ref 96–112)
GFR: 69.1 mL/min (ref >60.00)
GLUCOSE: 84 mg/dL (ref 70–99)
Potassium: 4.5 mEq/L (ref 3.5–5.1)
Sodium: 142 mEq/L (ref 135–145)
Total Bilirubin: 0.3 mg/dL (ref 0.2–1.2)
Total Protein: 7.7 g/dL (ref 6.0–8.3)

## 2016-11-14 LAB — CBC
HEMATOCRIT: 34.3 % — AB (ref 36.0–46.0)
Hemoglobin: 11.4 g/dL — ABNORMAL LOW (ref 12.0–15.0)
MCHC: 33.3 g/dL (ref 30.0–36.0)
MCV: 87.4 fl (ref 78.0–100.0)
PLATELETS: 200 10*3/uL (ref 150.0–400.0)
RBC: 3.93 Mil/uL (ref 3.87–5.11)
RDW: 15 % (ref 11.5–15.5)
WBC: 5.6 10*3/uL (ref 4.0–10.5)

## 2016-11-14 LAB — TSH: TSH: 1.98 u[IU]/mL (ref 0.35–4.50)

## 2016-11-14 NOTE — Assessment & Plan Note (Signed)
No trauma but daily pain, declines PT but accepts ortho referral

## 2016-11-14 NOTE — Assessment & Plan Note (Signed)
Persistent over many years but escalating over past 6 months and starting to limit daily activity. Is referred to orthopaedics for further consideration at this time

## 2016-11-14 NOTE — Assessment & Plan Note (Signed)
Painful, dark nodule on middle finger on left hand at pip joint. Referred to hand specialist for further evaluation due to pain and patient anxiety regarding lesion

## 2016-11-14 NOTE — Assessment & Plan Note (Signed)
minimize simple carbs. Increase exercise as tolerated.  

## 2016-11-14 NOTE — Progress Notes (Signed)
Patient ID: Kristi [name], female   DOB: 12/05/1955, 61 y.o.   MRN: [id number]   Subjective:    Patient ID: Kristi [name], female    DOB: [birth year], 61 y.o.   MRN: [id number]  Chief Complaint  Patient presents with  . Annual Exam    HPI Patient is in today for annual preventative exam and follow up on numerous medical concerns. Her greatest concern is pain, she notes worsening pain in b/l knees without swelling, redness or trauma. It has been present for years but has escalated over past 6 months and is starting to affect her activity levels. She also notes a painful nodule has developed over 3 rd finger on left hand. Also notes fatigue, ongoing stressors with family resulting in anhedonia and anxiety. No suicidal ideation. Has been less active due to pain but is trying to maintain a heart healthy diet. Denies CP/palp/SOB/HA/congestion/fevers/GI or GU c/o. Taking meds as prescribed  Past Medical History:  Diagnosis Date  . Anemia    Iron deficinecy anemia  . Anxiety   . Anxiety and depression 05/15/2014  . Arthritis   . Breast cancer ([place])    left ,last radiation 2'15, last chemo 8'14  . Constipation 11/13/2016  . Depression   . History of chicken pox   . History of radiation therapy 09/09/13-10/28/13   45 gray to left breast, lumpectomy cavity boosted to 63 gray  . HTN (hypertension) 11/13/2016  . Hyperglycemia 01/09/2016  . Hypertension   . MRSA (methicillin resistant Staphylococcus aureus) 2009   right groin area-no issues now. 04-07-14 PCR screen negative today.  . Neuropathy ([place])   . Preventative health care 11/13/2016    Past Surgical History:  Procedure Laterality Date  . ANAL SPHINCTEROTOMY  04/2011  . APPENDECTOMY  1980  . AXILLARY LYMPH NODE DISSECTION Left 02/04/2013   Procedure: LEFT AXILLARY LYMPH NODE DISSECTION;  Surgeon: Stark Klein, MD;  Location: [facility];  Service: General;  Laterality: Left;  End: [id number]  . BREAST LUMPECTOMY WITH NEEDLE LOCALIZATION Left 02/04/2013   Procedure: LEFT BREAST LUMPECTOMY WITH NEEDLE LOCALIZATION;  Surgeon: Stark Klein, MD;  Location: [facility];  Service: General;  Laterality: Left;  . BREAST SURGERY     Lumpectomy in april 2014  . HEMORRHOID SURGERY  04/2011   ligation  . PORT-A-CATH REMOVAL N/A 04/16/2014   Procedure: REMOVAL PORT-A-CATH;  Surgeon: Stark Klein, MD;  Location: [facility];  Service: General;  Laterality: N/A;  . PORTACATH PLACEMENT Right 02/04/2013   Procedure: INSERTION PORT-A-CATH;  Surgeon: Stark Klein, MD;  Location: [facility];  Service: General;  Laterality: Right;  Start Time: [id number].  Marland Kitchen SHOULDER ARTHROSCOPY WITH ROTATOR CUFF REPAIR AND SUBACROMIAL DECOMPRESSION Left 02/24/2014   Procedure: SHOULDER ARTHROSCOPY WITH ROTATOR CUFF REPAIR AND SUBACROMIAL DECOMPRESSION;  Surgeon: Meredith Pel, MD;  Location: [facility];  Service: Orthopedics;  Laterality: Left;  LEFT SHOULDER DIAGNOSTIC OPERATIVE ARTHROSCOPY, SUBACROMIAL DECOMPRESSION, ROTATOR CUFF TEAR REPAIR    Family History  Problem Relation Age of Onset  . Lung cancer Father   . Hypertension Father   . Thyroid cancer Father     dx in his 20s  . Cancer Father     lung, thyroid, smoker  . Breast cancer Paternal Aunt 56  . Colon cancer Paternal Aunt     dx in her 50x  . Cervical cancer Paternal Aunt     dzx in her 69s  . Ovarian cancer Cousin     dx in her lage 6s  .  Breast cancer Cousin     maternal first cousin, once removed; dx in her late 70s  . Breast cancer Cousin     maternal first cousin once removed; dx in late 91s  . Hypertension Mother   . Diabetes Mother   . Dementia Mother   . Hypertension Brother   . Seizures Brother     Alcohol induced.  . Alcohol abuse Brother     drinker, smoker  . Cancer Paternal Uncle     oral cancer  . Kidney cancer Paternal Grandmother   . Cancer Cousin     several paternal cousins with brain cancer, leukemia, and other cancers  . Arthritis Daughter     back surgery    Social History   Social History  .  Marital status: Married    Spouse name: [name]  . Number of children: 1  . Years of education: 48   Occupational History  . [employer]   Social History Main Topics  . Smoking status: Never Smoker  . Smokeless tobacco: Never Used  . Alcohol use No  . Drug use: No  . Sexual activity: No     Comment: lives with husband, disability/retirement. [employer], no dietary restrictions   Other Topics Concern  . Not on file   Social History Narrative   Patient is married [name]) and lives at home with her husband.   Patient has one daughter.   Patient is working at [employer]   Patient has a 12th grade education.   Patient drinks very little caffeine.    Outpatient Medications Prior to Visit  Medication Sig Dispense Refill  . amitriptyline (ELAVIL) 25 MG tablet Take 4 tablets (100 mg total) by mouth at bedtime. 30 tablet 1  . anastrozole (ARIMIDEX) 1 MG tablet Take 1 tablet (1 mg total) by mouth daily. 90 tablet 3  . Ascorbic Acid (VITAMIN C) 1000 MG tablet Take 1,000 mg by mouth daily.    Marland Kitchen aspirin EC 81 MG tablet Take 162 mg by mouth daily.    . B Complex-C (SUPER B COMPLEX PO) Take 1 tablet by mouth daily.    . clonazePAM (KLONOPIN) 1 MG tablet Take 1 tablet (1 mg total) by mouth 3 (three) times daily. 90 tablet 0  . diclofenac (VOLTAREN) 75 MG EC tablet TAKE 1 TABLET (75 MG TOTAL) BY MOUTH 2 (TWO) TIMES DAILY. 30 tablet 0  . Eszopiclone (ESZOPICLONE) 3 MG TABS Take 3 mg by mouth at bedtime. Take immediately before bedtime    . gabapentin (NEURONTIN) 800 MG tablet TAKE 1 TABLET BY MOUTH 3 TIMES A DAY 90 tablet 3  . KLOR-CON M20 20 MEQ tablet TAKE 2 TABLETS BY MOUTH EVERY DAY FOR 3 DAYS, THEN TAKE 1 TABLET EVERY DAY 35 tablet 1  . Lurasidone HCl (LATUDA) 60 MG TABS Take 60 mg by mouth daily.    . Multiple Vitamin (MULTIVITAMIN WITH MINERALS) TABS tablet Take 1 tablet by mouth daily.    . valsartan-hydrochlorothiazide (DIOVAN-HCT) 320-25 MG tablet Take  1 tablet by mouth every morning. 30 tablet 2  . KLOR-CON M20 20 MEQ tablet TAKE 2 TABLETS BY MOUTH EVERY DAY FOR 3 DAYS, THEN TAKE 1 TABLET EVERY DAY 35 tablet 1   No facility-administered medications prior to visit.     No Known Allergies  Review of Systems  Constitutional: Positive for malaise/fatigue. Negative for chills and fever.  HENT: Negative for congestion and hearing loss.   Eyes: Negative  for discharge.  Respiratory: Negative for cough, sputum production and shortness of breath.   Cardiovascular: Negative for chest pain, palpitations and leg swelling.  Gastrointestinal: Negative for abdominal pain, blood in stool, constipation, diarrhea, heartburn, nausea and vomiting.  Genitourinary: Negative for dysuria, frequency, hematuria and urgency.  Musculoskeletal: Positive for joint pain. Negative for back pain, falls and myalgias.  Skin: Negative for rash.  Neurological: Negative for dizziness, sensory change, loss of consciousness, weakness and headaches.  Endo/Heme/Allergies: Negative for environmental allergies. Does not bruise/bleed easily.  Psychiatric/Behavioral: Negative for depression and suicidal ideas. The patient is nervous/anxious.        Objective:    Physical Exam  Constitutional: She is oriented to person, place, and time. She appears well-developed and well-nourished. No distress.  HENT:  Head: Normocephalic and atraumatic.  Eyes: Conjunctivae are normal.  Neck: Neck supple. No thyromegaly present.  Cardiovascular: Normal rate, regular rhythm and normal heart sounds.   No murmur heard. Pulmonary/Chest: Effort normal and breath sounds normal. No respiratory distress.  Abdominal: Soft. Bowel sounds are normal. She exhibits no distension and no mass. There is no tenderness. There is no guarding.  Musculoskeletal: She exhibits no edema.  Lymphadenopathy:    She has no cervical adenopathy.  Neurological: She is alert and oriented to person, place, and time.    Skin: Skin is warm and dry.  Psychiatric: She has a normal mood and affect. Her behavior is normal.    BP 112/80 (BP Location: Left Arm, Patient Position: Sitting, Cuff Size: Normal)   Pulse 79   Temp 97.5 F (36.4 C) (Oral)   Ht 5\' 2"  (1.575 m)   Wt 186 lb (84.4 kg)   LMP 01/20/2013   BMI 34.02 kg/m  Wt Readings from Last 3 Encounters:  11/13/16 186 lb (84.4 kg)  10/12/16 182 lb 9.6 oz (82.8 kg)  07/26/16 175 lb (79.4 kg)     Lab Results  Component Value Date   WBC 5.3 12/27/2015   HGB 11.3 (L) 12/27/2015   HCT 34.6 (L) 12/27/2015   PLT 160.0 12/27/2015   GLUCOSE 95 01/04/2016   CHOL 165 12/27/2015   TRIG 126.0 12/27/2015   HDL 56.10 12/27/2015   LDLCALC 84 12/27/2015   ALT 15 01/04/2016   AST 19 01/04/2016   NA 142 01/04/2016   K 3.9 01/04/2016   CL 104 01/04/2016   CREATININE 0.85 01/04/2016   BUN 11 01/04/2016   CO2 32 01/04/2016   TSH 2.17 12/27/2015   INR 1.02 07/19/2013    Lab Results  Component Value Date   TSH 2.17 12/27/2015   Lab Results  Component Value Date   WBC 5.3 12/27/2015   HGB 11.3 (L) 12/27/2015   HCT 34.6 (L) 12/27/2015   MCV 89.0 12/27/2015   PLT 160.0 12/27/2015   Lab Results  Component Value Date   NA 142 01/04/2016   K 3.9 01/04/2016   CHLORIDE 106 09/01/2014   CO2 32 01/04/2016   GLUCOSE 95 01/04/2016   BUN 11 01/04/2016   CREATININE 0.85 01/04/2016   BILITOT 0.5 01/04/2016   ALKPHOS 45 01/04/2016   AST 19 01/04/2016   ALT 15 01/04/2016   PROT 6.8 01/04/2016   ALBUMIN 3.8 01/04/2016   CALCIUM 9.4 01/04/2016   ANIONGAP 8 09/01/2014   GFR 87.46 01/04/2016   Lab Results  Component Value Date   CHOL 165 12/27/2015   Lab Results  Component Value Date   HDL 56.10 12/27/2015   Lab Results  Component  Value Date   LDLCALC 84 12/27/2015   Lab Results  Component Value Date   TRIG 126.0 12/27/2015   Lab Results  Component Value Date   CHOLHDL 3 12/27/2015   No results found for: HGBA1C     Assessment &  Plan:   Problem List Items Addressed This Visit    Hyperglycemia    minimize simple carbs. Increase exercise as tolerated.      Constipation    Encouraged increased hydration and fiber in diet. Daily probiotics. If bowels not moving can use MOM 2 tbls po in 4 oz of warm prune juice by mouth every 2-3 days. If no results then repeat in 4 hours with  Dulcolax suppository pr, may repeat again in 4 more hours as needed. Seek care if symptoms worsen. Consider daily Miralax and/or Dulcolax if symptoms persist.       Relevant Orders   Ambulatory referral to Gastroenterology   Preventative health care - Primary    Patient encouraged to maintain heart healthy diet, regular exercise, adequate sleep. Consider daily probiotics. Take medications as prescribed. Labs ordered and patient will return for completion.      Relevant Orders   Lipid panel   Comprehensive metabolic panel   CBC   TSH   HTN (hypertension)    Well controlled, no changes to meds. Encouraged heart healthy diet such as the DASH diet and exercise as tolerated.       Relevant Orders   Lipid panel   Comprehensive metabolic panel   CBC   TSH   Nodule of finger of left hand    Painful, dark nodule on middle finger on left hand at pip joint. Referred to hand specialist for further evaluation due to pain and patient anxiety regarding lesion      Relevant Orders   Ambulatory referral to Hand Surgery   Right hip pain    No trauma but daily pain, declines PT but accepts ortho referral      Relevant Orders   Ambulatory referral to Orthopedic Surgery   Chronic pain of both knees    Persistent over many years but escalating over past 6 months and starting to limit daily activity. Is referred to orthopaedics for further consideration at this time      Relevant Orders   Ambulatory referral to Orthopedic Surgery      I am having Ms. Luthi maintain her B Complex-C (SUPER B COMPLEX PO), aspirin EC, multivitamin with minerals,  vitamin C, Eszopiclone, anastrozole, amitriptyline, clonazePAM, Lurasidone HCl, KLOR-CON M20, valsartan-hydrochlorothiazide, diclofenac, and gabapentin.  No orders of the defined types were placed in this encounter.   [name], MD

## 2016-11-14 NOTE — Assessment & Plan Note (Addendum)
Patient encouraged to maintain heart healthy diet, regular exercise, adequate sleep. Consider daily probiotics. Take medications as prescribed. Labs ordered and patient will return for completion.

## 2016-11-14 NOTE — Assessment & Plan Note (Signed)
Well controlled, no changes to meds. Encouraged heart healthy diet such as the DASH diet and exercise as tolerated.  °

## 2016-11-16 ENCOUNTER — Ambulatory Visit (INDEPENDENT_AMBULATORY_CARE_PROVIDER_SITE_OTHER): Payer: Medicare Other | Admitting: Orthopaedic Surgery

## 2016-11-16 ENCOUNTER — Ambulatory Visit (INDEPENDENT_AMBULATORY_CARE_PROVIDER_SITE_OTHER): Payer: Medicare Other

## 2016-11-16 ENCOUNTER — Encounter (INDEPENDENT_AMBULATORY_CARE_PROVIDER_SITE_OTHER): Payer: Self-pay | Admitting: Orthopaedic Surgery

## 2016-11-16 VITALS — BP 115/72 | HR 81 | Resp 12 | Ht 62.0 in | Wt 186.0 lb

## 2016-11-16 DIAGNOSIS — R2232 Localized swelling, mass and lump, left upper limb: Secondary | ICD-10-CM

## 2016-11-16 NOTE — Progress Notes (Signed)
Office Visit Note   Patient: Kristi [name]           Date of Birth: 06-Apr-1956           MRN: [id number] Visit Date: 11/16/2016              Requested by: Mosie Lukes, MD [address] [place], [place] [address] PCP: [name], MD   Assessment & Plan: Visit Diagnoses:  1. Mass of left finger     Plan:  #1: At this time she would like to consider excision of the mass. Procedure was the benefits were explained to her and she is understanding. We'll schedule this out of the office in the very near future.  Follow-Up Instructions: Return if symptoms worsen or fail to improve.   Orders:  Orders Placed This Encounter  Procedures  . XR Finger Ring Left   No orders of the defined types were placed in this encounter.     Procedures: No procedures performed   Clinical Data: No additional findings.   Subjective: Chief Complaint  Patient presents with  . Left Hand - Pain    61 year old African-American female who is seen today for evaluation of a mass of the PIP joint on the volar surface of her left ring finger. She states that this is been there for 2 weeks. She states it is tender and does interfere with grasp. Denies any neurovascular compromise. Denies any history of any type of injury or foreign body.    Review of Systems  Constitutional: Negative.        History of breast cancer  HENT: Negative.   Respiratory: Negative.   Cardiovascular: Negative.        History of hypertension  Gastrointestinal:       History of acid reflux  Genitourinary: Negative.   Skin: Negative.   Neurological: Negative.   Hematological: Negative.   Psychiatric/Behavioral:       History of depression     Objective: Vital Signs: BP 115/72 (BP Location: Right Arm, Patient Position: Sitting, Cuff Size: Normal)   Pulse 81   Resp 12   Ht 5\' 2"  (1.575 m)   Wt 186 lb (84.4 kg)   LMP 01/20/2013   BMI 34.02 kg/m   Physical Exam  Constitutional: She is  oriented to person, place, and time. She appears well-developed and well-nourished.  HENT:  Head: Normocephalic and atraumatic.  Eyes: EOM are normal. Pupils are equal, round, and reactive to light.  Cardiovascular: Normal rate.   Pulmonary/Chest: Effort normal.  Neurological: She is alert and oriented to person, place, and time.  Skin: Skin is warm and dry.  Psychiatric: She has a normal mood and affect. Her behavior is normal. Judgment and thought content normal.    Right Hand Exam   Tenderness  The patient is experiencing tenderness in the palmer area.  Comments:  Left ring finger does have full flexion and extension. She does not have any locking symptoms. There is a palpable mass on the volar aspect of the PIP crease more to the ulnar aspect.      Specialty Comments:  No specialty comments available.  Imaging: Xr Finger Ring Left  Result Date: 11/16/2016 Three-view x-ray of the hand does reveal some degenerative changes but no foreign body is noted in the area of the PIP joint of the ring finger.    PMFS History: Patient Active Problem List   Diagnosis Date Noted  . Nodule of  finger of left hand 11/14/2016  . Right hip pain 11/14/2016  . Chronic pain of both knees 11/14/2016  . Constipation 11/13/2016  . Preventative health care 11/13/2016  . HTN (hypertension) 11/13/2016  . Generalized anxiety disorder 03/14/2016    Class: Chronic  . Hyperglycemia 01/09/2016  . Chemotherapy-induced peripheral neuropathy ([place] [facility]) 09/21/2015  . History of chicken pox   . Shingles 05/15/2014  . Anxiety and depression 05/15/2014  . Rotator cuff tear 02/24/2014  . Hot flashes due to tamoxifen 02/20/2014  . Left shoulder pain with abduction 12/12/2013  . CVA (cerebral infarction) 07/18/2013  . TIA (transient ischemic attack) 07/18/2013  . Contracture of axilla 05/20/2013  . Primary cancer of upper inner quadrant of left female breast ([place] [facility]) 01/24/2013  . Anal fissure 05/03/2011  .  Hemorrhoids, internal, with prolapse 05/03/2011   Past Medical History:  Diagnosis Date  . Anemia    Iron deficinecy anemia  . Anxiety   . Anxiety and depression 05/15/2014  . Arthritis   . Breast cancer ([facility])    left ,last radiation 2'15, last chemo 8'14  . Constipation 11/13/2016  . Depression   . History of chicken pox   . History of radiation therapy 09/09/13-10/28/13   45 gray to left breast, lumpectomy cavity boosted to 63 gray  . HTN (hypertension) 11/13/2016  . Hyperglycemia 01/09/2016  . Hypertension   . MRSA (methicillin resistant Staphylococcus aureus) 2009   right groin area-no issues now. 04-07-14 PCR screen negative today.  . Neuropathy ([facility])   . Preventative health care 11/13/2016    Family History  Problem Relation Age of Onset  . Lung cancer Father   . Hypertension Father   . Thyroid cancer Father     dx in his 31s  . Cancer Father     lung, thyroid, smoker  . Breast cancer Paternal Aunt 35  . Colon cancer Paternal Aunt     dx in her 50x  . Cervical cancer Paternal Aunt     dzx in her 65s  . Ovarian cancer Cousin     dx in her lage 21s  . Breast cancer Cousin     maternal first cousin, once removed; dx in her late 27s  . Breast cancer Cousin     maternal first cousin once removed; dx in late 54s  . Hypertension Mother   . Diabetes Mother   . Dementia Mother   . Hypertension Brother   . Seizures Brother     Alcohol induced.  . Alcohol abuse Brother     drinker, smoker  . Cancer Paternal Uncle     oral cancer  . Kidney cancer Paternal Grandmother   . Cancer Cousin     several paternal cousins with brain cancer, leukemia, and other cancers  . Arthritis Daughter     back surgery    Past Surgical History:  Procedure Laterality Date  . ANAL SPHINCTEROTOMY  04/2011  . APPENDECTOMY  1980  . AXILLARY LYMPH NODE DISSECTION Left 02/04/2013   Procedure: LEFT AXILLARY LYMPH NODE DISSECTION;  Surgeon: Stark Klein, MD;  Location: [facility];  Service: General;   Laterality: Left;  End: [id number]  . BREAST LUMPECTOMY WITH NEEDLE LOCALIZATION Left 02/04/2013   Procedure: LEFT BREAST LUMPECTOMY WITH NEEDLE LOCALIZATION;  Surgeon: Stark Klein, MD;  Location: [facility];  Service: General;  Laterality: Left;  . BREAST SURGERY     Lumpectomy in april 2014  . HEMORRHOID SURGERY  04/2011   ligation  . PORT-A-CATH REMOVAL N/A  04/16/2014   Procedure: REMOVAL PORT-A-CATH;  Surgeon: Stark Klein, MD;  [facility];  Service: General;  Laterality: N/A;  . PORTACATH PLACEMENT Right 02/04/2013   Procedure: INSERTION PORT-A-CATH;  Surgeon: Stark Klein, MD;  Location: [facility];  Service: General;  Laterality: Right;  Start Time: [id number].  Marland Kitchen SHOULDER ARTHROSCOPY WITH ROTATOR CUFF REPAIR AND SUBACROMIAL DECOMPRESSION Left 02/24/2014   Procedure: SHOULDER ARTHROSCOPY WITH ROTATOR CUFF REPAIR AND SUBACROMIAL DECOMPRESSION;  Surgeon: Meredith Pel, MD;  Location: [facility];  Service: Orthopedics;  Laterality: Left;  LEFT SHOULDER DIAGNOSTIC OPERATIVE ARTHROSCOPY, SUBACROMIAL DECOMPRESSION, ROTATOR CUFF TEAR REPAIR   Social History   Occupational History  . [employer]   Social History Main Topics  . Smoking status: Never Smoker  . Smokeless tobacco: Never Used  . Alcohol use No  . Drug use: No  . Sexual activity: No     Comment: lives with husband, disability/retirement. [employer], no dietary restrictions

## 2016-11-17 DIAGNOSIS — F419 Anxiety disorder, unspecified: Secondary | ICD-10-CM | POA: Diagnosis not present

## 2016-11-17 DIAGNOSIS — F329 Major depressive disorder, single episode, unspecified: Secondary | ICD-10-CM | POA: Diagnosis not present

## 2016-11-21 ENCOUNTER — Other Ambulatory Visit: Payer: Self-pay | Admitting: Family Medicine

## 2016-11-30 DIAGNOSIS — R2232 Localized swelling, mass and lump, left upper limb: Secondary | ICD-10-CM | POA: Diagnosis not present

## 2016-11-30 DIAGNOSIS — D1739 Benign lipomatous neoplasm of skin and subcutaneous tissue of other sites: Secondary | ICD-10-CM | POA: Diagnosis not present

## 2016-12-01 ENCOUNTER — Other Ambulatory Visit: Payer: Self-pay | Admitting: Hematology and Oncology

## 2016-12-01 ENCOUNTER — Telehealth (INDEPENDENT_AMBULATORY_CARE_PROVIDER_SITE_OTHER): Payer: Self-pay | Admitting: Orthopaedic Surgery

## 2016-12-01 DIAGNOSIS — C50212 Malignant neoplasm of upper-inner quadrant of left female breast: Secondary | ICD-10-CM

## 2016-12-01 DIAGNOSIS — F332 Major depressive disorder, recurrent severe without psychotic features: Secondary | ICD-10-CM | POA: Diagnosis not present

## 2016-12-01 NOTE — Telephone Encounter (Signed)
See note from pt

## 2016-12-01 NOTE — Telephone Encounter (Signed)
Called and left message.

## 2016-12-01 NOTE — Telephone Encounter (Signed)
Patient had a missed call from the office, so she was returning the call. Patient had surgery yesterday with Dr. Durward Fortes.

## 2016-12-03 ENCOUNTER — Other Ambulatory Visit: Payer: Self-pay | Admitting: Family Medicine

## 2016-12-04 ENCOUNTER — Ambulatory Visit (INDEPENDENT_AMBULATORY_CARE_PROVIDER_SITE_OTHER): Payer: Medicare Other | Admitting: Orthopaedic Surgery

## 2016-12-04 ENCOUNTER — Encounter (INDEPENDENT_AMBULATORY_CARE_PROVIDER_SITE_OTHER): Payer: Self-pay | Admitting: Orthopaedic Surgery

## 2016-12-04 VITALS — BP 112/74 | HR 84 | Resp 12 | Ht 62.0 in | Wt 186.0 lb

## 2016-12-04 DIAGNOSIS — M79645 Pain in left finger(s): Secondary | ICD-10-CM

## 2016-12-04 NOTE — Progress Notes (Signed)
Office Visit Note   Patient: Kristi [name]           Date of Birth: May 10, 1956           MRN: [id number] Visit Date: 12/04/2016              Requested by: Mosie Lukes, MD [address] [place], [place] [address] PCP: [name], MD   Assessment & Plan: Visit Diagnoses: 4 days status post excision of small mass from left index finger-doing well  Plan: Wound is healing nicely without evidence of infection, wound was cleaned and redressed. Office 1 week   Follow-Up Instructions: No Follow-up on file.   Orders:  No orders of the defined types were placed in this encounter.  No orders of the defined types were placed in this encounter.     Procedures: No procedures performed   Clinical Data: No additional findings.   Subjective: No chief complaint on file.   4 days status post left ring finger mass removal.  Mass appeared to be a small lipoma. No distinctive abnormal tissue identified at the time of surgery. Patient is doing well without related numbness tingling, fever or chills. I did not send any specimens to the lab  Review of Systems   Objective: Vital Signs: Ht 5\' 2"  (1.575 m)   Wt 186 lb (84.4 kg)   LMP 01/20/2013   BMI 34.02 kg/m   Physical Exam  Ortho Exam left index finger incision is transverse in the flexion crease of the PIP joint. Wound is clean. No swelling. Normal neuro exam to the finger. Good capillary refill.  Specialty Comments:  No specialty comments available.  Imaging: No results found.   PMFS History: Patient Active Problem List   Diagnosis Date Noted  . Nodule of finger of left hand 11/14/2016  . Right hip pain 11/14/2016  . Chronic pain of both knees 11/14/2016  . Constipation 11/13/2016  . Preventative health care 11/13/2016  . HTN (hypertension) 11/13/2016  . Generalized anxiety disorder 03/14/2016    Class: Chronic  . Hyperglycemia 01/09/2016  . Chemotherapy-induced peripheral neuropathy ([facility])  09/21/2015  . History of chicken pox   . Shingles 05/15/2014  . Anxiety and depression 05/15/2014  . Rotator cuff tear 02/24/2014  . Hot flashes due to tamoxifen 02/20/2014  . Left shoulder pain with abduction 12/12/2013  . CVA (cerebral infarction) 07/18/2013  . TIA (transient ischemic attack) 07/18/2013  . Contracture of axilla 05/20/2013  . Primary cancer of upper inner quadrant of left female breast ([facility]) 01/24/2013  . Anal fissure 05/03/2011  . Hemorrhoids, internal, with prolapse 05/03/2011   Past Medical History:  Diagnosis Date  . Anemia    Iron deficinecy anemia  . Anxiety   . Anxiety and depression 05/15/2014  . Arthritis   . Breast cancer ([facility])    left ,last radiation 2'15, last chemo 8'14  . Constipation 11/13/2016  . Depression   . History of chicken pox   . History of radiation therapy 09/09/13-10/28/13   45 gray to left breast, lumpectomy cavity boosted to 63 gray  . HTN (hypertension) 11/13/2016  . Hyperglycemia 01/09/2016  . Hypertension   . MRSA (methicillin resistant Staphylococcus aureus) 2009   right groin area-no issues now. 04-07-14 PCR screen negative today.  . Neuropathy ([facility])   . Preventative health care 11/13/2016    Family History  Problem Relation Age of Onset  . Lung cancer Father   . Hypertension Father   .  Thyroid cancer Father     dx in his 50s  . Cancer Father     lung, thyroid, smoker  . Breast cancer Paternal Aunt 44  . Colon cancer Paternal Aunt     dx in her 50x  . Cervical cancer Paternal Aunt     dzx in her 37s  . Ovarian cancer Cousin     dx in her lage 38s  . Breast cancer Cousin     maternal first cousin, once removed; dx in her late 39s  . Breast cancer Cousin     maternal first cousin once removed; dx in late 75s  . Hypertension Mother   . Diabetes Mother   . Dementia Mother   . Hypertension Brother   . Seizures Brother     Alcohol induced.  . Alcohol abuse Brother     drinker, smoker  . Cancer Paternal Uncle     oral  cancer  . Kidney cancer Paternal Grandmother   . Cancer Cousin     several paternal cousins with brain cancer, leukemia, and other cancers  . Arthritis Daughter     back surgery    Past Surgical History:  Procedure Laterality Date  . ANAL SPHINCTEROTOMY  04/2011  . APPENDECTOMY  1980  . AXILLARY LYMPH NODE DISSECTION Left 02/04/2013   Procedure: LEFT AXILLARY LYMPH NODE DISSECTION;  Surgeon: Stark Klein, MD;  Location: [facility];  Service: General;  Laterality: Left;  End: [id number]  . BREAST LUMPECTOMY WITH NEEDLE LOCALIZATION Left 02/04/2013   Procedure: LEFT BREAST LUMPECTOMY WITH NEEDLE LOCALIZATION;  Surgeon: Stark Klein, MD;  Location: [facility];  Service: General;  Laterality: Left;  . BREAST SURGERY     Lumpectomy in april 2014  . HEMORRHOID SURGERY  04/2011   ligation  . PORT-A-CATH REMOVAL N/A 04/16/2014   Procedure: REMOVAL PORT-A-CATH;  Surgeon: Stark Klein, MD;  [facility];  Service: General;  Laterality: N/A;  . PORTACATH PLACEMENT Right 02/04/2013   Procedure: INSERTION PORT-A-CATH;  Surgeon: Stark Klein, MD;  Location: [facility];  Service: General;  Laterality: Right;  Start Time: [id number].  Marland Kitchen SHOULDER ARTHROSCOPY WITH ROTATOR CUFF REPAIR AND SUBACROMIAL DECOMPRESSION Left 02/24/2014   Procedure: SHOULDER ARTHROSCOPY WITH ROTATOR CUFF REPAIR AND SUBACROMIAL DECOMPRESSION;  Surgeon: Meredith Pel, MD;  Location: [facility];  Service: Orthopedics;  Laterality: Left;  LEFT SHOULDER DIAGNOSTIC OPERATIVE ARTHROSCOPY, SUBACROMIAL DECOMPRESSION, ROTATOR CUFF TEAR REPAIR   Social History   Occupational History  . [employer]   Social History Main Topics  . Smoking status: Never Smoker  . Smokeless tobacco: Never Used  . Alcohol use No  . Drug use: No  . Sexual activity: No     Comment: lives with husband, disability/retirement. [employer], no dietary restrictions

## 2016-12-11 ENCOUNTER — Encounter (INDEPENDENT_AMBULATORY_CARE_PROVIDER_SITE_OTHER): Payer: Self-pay | Admitting: Orthopaedic Surgery

## 2016-12-11 ENCOUNTER — Ambulatory Visit (INDEPENDENT_AMBULATORY_CARE_PROVIDER_SITE_OTHER): Payer: Medicare Other | Admitting: Orthopaedic Surgery

## 2016-12-11 VITALS — BP 111/72 | HR 73 | Resp 14 | Ht 62.0 in | Wt 186.0 lb

## 2016-12-11 DIAGNOSIS — R2232 Localized swelling, mass and lump, left upper limb: Secondary | ICD-10-CM

## 2016-12-11 NOTE — Progress Notes (Signed)
Office Visit Note   Patient: Kristi [name]           Date of Birth: 01/13/1956           MRN: [id number] Visit Date: 12/11/2016              Requested by: Kristi [name] [address] [place], [place] [address] PCP: Kristi Homans, [name]   Assessment & Plan: Visit Diagnoses: 11 days status post excision of a mass from the left ring finger-doing well The mass appeared to be a small lipoma  Plan: Skin appears to be a little macerated so it's imperative that Kristi [name] keeps the skin dry and like to see her back in 2 weeks  Follow-Up Instructions: No Follow-up on file.   Orders:  No orders of the defined types were placed in this encounter.  No orders of the defined types were placed in this encounter.     Procedures: No procedures performed   Clinical Data: No additional findings.   Subjective: No chief complaint on file.    Left ring finger incision is transverse in the flexion crease of the PIP joint. Wound is clean. No swelling. Good capillary refill.  Pt also wants to discuss BIL knee pain.     Review of Systems   Objective: Vital Signs: LMP 01/20/2013   Physical Exam  Ortho Exam left ring finger  notes the wound to be healing nicely with some maceration. Very superficial separation of the wound. Neurovascular exam intact.  Specialty Comments:  No specialty comments available.  Imaging: No results found.   PMFS History: Patient Active Problem List   Diagnosis Date Noted  . Nodule of finger of left hand 11/14/2016  . Right hip pain 11/14/2016  . Chronic pain of both knees 11/14/2016  . Constipation 11/13/2016  . Preventative health care 11/13/2016  . HTN (hypertension) 11/13/2016  . Generalized anxiety disorder 03/14/2016    Class: Chronic  . Hyperglycemia 01/09/2016  . Chemotherapy-induced peripheral neuropathy ([facility]) 09/21/2015  . History of chicken pox   . Shingles 05/15/2014  . Anxiety and depression 05/15/2014  .  Rotator cuff tear 02/24/2014  . Hot flashes due to tamoxifen 02/20/2014  . Left shoulder pain with abduction 12/12/2013  . CVA (cerebral infarction) 07/18/2013  . TIA (transient ischemic attack) 07/18/2013  . Contracture of axilla 05/20/2013  . Primary cancer of upper inner quadrant of left female breast ([facility]) 01/24/2013  . Anal fissure 05/03/2011  . Hemorrhoids, internal, with prolapse 05/03/2011   Past Medical History:  Diagnosis Date  . Anemia    Iron deficinecy anemia  . Anxiety   . Anxiety and depression 05/15/2014  . Arthritis   . Breast cancer ([facility])    left ,last radiation 2'15, last chemo 8'14  . Constipation 11/13/2016  . Depression   . History of chicken pox   . History of radiation therapy 09/09/13-10/28/13   45 gray to left breast, lumpectomy cavity boosted to 63 gray  . HTN (hypertension) 11/13/2016  . Hyperglycemia 01/09/2016  . Hypertension   . MRSA (methicillin resistant Staphylococcus aureus) 2009   right groin area-no issues now. 04-07-14 PCR screen negative today.  . Neuropathy ([facility])   . Preventative health care 11/13/2016    Family History  Problem Relation Age of Onset  . Lung cancer Father   . Hypertension Father   . Thyroid cancer Father     dx in his 59s  . Cancer Father  lung, thyroid, smoker  . Breast cancer Paternal Aunt 46  . Colon cancer Paternal Aunt     dx in her 50x  . Cervical cancer Paternal Aunt     dzx in her 79s  . Ovarian cancer Cousin     dx in her lage 100s  . Breast cancer Cousin     maternal first cousin, once removed; dx in her late 95s  . Breast cancer Cousin     maternal first cousin once removed; dx in late 40s  . Hypertension Mother   . Diabetes Mother   . Dementia Mother   . Hypertension Brother   . Seizures Brother     Alcohol induced.  . Alcohol abuse Brother     drinker, smoker  . Cancer Paternal Uncle     oral cancer  . Kidney cancer Paternal Grandmother   . Cancer Cousin     several paternal cousins with brain  cancer, leukemia, and other cancers  . Arthritis Daughter     back surgery    Past Surgical History:  Procedure Laterality Date  . ANAL SPHINCTEROTOMY  04/2011  . APPENDECTOMY  1980  . AXILLARY LYMPH NODE DISSECTION Left 02/04/2013   Procedure: LEFT AXILLARY LYMPH NODE DISSECTION;  Surgeon: Kristi Klein, [name];  Location: [facility];  Service: General;  Laterality: Left;  End: [id number]  . BREAST LUMPECTOMY WITH NEEDLE LOCALIZATION Left 02/04/2013   Procedure: LEFT BREAST LUMPECTOMY WITH NEEDLE LOCALIZATION;  Surgeon: Kristi Klein, [name];  Location: [facility];  Service: General;  Laterality: Left;  . BREAST SURGERY     Lumpectomy in april 2014  . HEMORRHOID SURGERY  04/2011   ligation  . PORT-A-CATH REMOVAL N/A 04/16/2014   Procedure: REMOVAL PORT-A-CATH;  Surgeon: Kristi Klein, [name];  [facility];  Service: General;  Laterality: N/A;  . PORTACATH PLACEMENT Right 02/04/2013   Procedure: INSERTION PORT-A-CATH;  Surgeon: Kristi Klein, [name];  Location: [facility];  Service: General;  Laterality: Right;  Start Time: [id number].  Marland Kitchen SHOULDER ARTHROSCOPY WITH ROTATOR CUFF REPAIR AND SUBACROMIAL DECOMPRESSION Left 02/24/2014   Procedure: SHOULDER ARTHROSCOPY WITH ROTATOR CUFF REPAIR AND SUBACROMIAL DECOMPRESSION;  Surgeon: Kristi Pel, [name];  Location: [facility];  Service: Orthopedics;  Laterality: Left;  LEFT SHOULDER DIAGNOSTIC OPERATIVE ARTHROSCOPY, SUBACROMIAL DECOMPRESSION, ROTATOR CUFF TEAR REPAIR   Social History   Occupational History  . [employer]   Social History Main Topics  . Smoking status: Never Smoker  . Smokeless tobacco: Never Used  . Alcohol use No  . Drug use: No  . Sexual activity: No     Comment: lives with husband, disability/retirement. [employer], no dietary restrictions

## 2016-12-12 DIAGNOSIS — K5900 Constipation, unspecified: Secondary | ICD-10-CM | POA: Diagnosis not present

## 2016-12-18 ENCOUNTER — Other Ambulatory Visit: Payer: Self-pay | Admitting: Family Medicine

## 2016-12-25 ENCOUNTER — Encounter (INDEPENDENT_AMBULATORY_CARE_PROVIDER_SITE_OTHER): Payer: Self-pay | Admitting: Orthopaedic Surgery

## 2016-12-25 ENCOUNTER — Ambulatory Visit (INDEPENDENT_AMBULATORY_CARE_PROVIDER_SITE_OTHER): Payer: Medicare Other | Admitting: Orthopaedic Surgery

## 2016-12-25 ENCOUNTER — Other Ambulatory Visit: Payer: Self-pay | Admitting: Family Medicine

## 2016-12-25 VITALS — BP 133/85 | HR 78 | Resp 14 | Ht 62.0 in | Wt 186.0 lb

## 2016-12-25 DIAGNOSIS — G8929 Other chronic pain: Secondary | ICD-10-CM

## 2016-12-25 DIAGNOSIS — M25561 Pain in right knee: Secondary | ICD-10-CM

## 2016-12-25 DIAGNOSIS — M25562 Pain in left knee: Secondary | ICD-10-CM

## 2016-12-25 DIAGNOSIS — R2232 Localized swelling, mass and lump, left upper limb: Secondary | ICD-10-CM

## 2016-12-25 NOTE — Progress Notes (Signed)
Office Visit Note   Patient: Kristi [name]           Date of Birth: 1956-06-05           MRN: [id number] Visit Date: 12/25/2016              Requested by: Mosie Lukes, MD [address] [place], [place] [address] PCP: [name], MD   Assessment & Plan: Visit Diagnoses: Status post excision of benign mass from PIP joint area of left ring finger. osteoarthritis both knees  Plan: Kristi [name] is 7 weeks status post excision of the mass from the palmar aspect of the left ring finger PIP joint and doing well. The mass is absent .neurovascular exam is intact and she's regained full motion of her finger.   She also relates having some trouble with both of her knees. she had films at [facility] in September demonstrating osteoarthritis in all 3 compartments. Long discussion regarding the osteoarthritis of both knees and treatment options. She has had diclofenac that seems to work so she certainly can continue with that. We had a discussion about weight loss and exercises. She is always welcome to consider cortisone injections and possibly Visco supplementation. We'll plan to see her back in apparent basis.  Follow-Up Instructions: No Follow-up on file.   Orders:  No orders of the defined types were placed in this encounter.  No orders of the defined types were placed in this encounter.     Procedures: No procedures performed   Clinical Data: No additional findings.   Subjective: No chief complaint on file.   Pt is   3 1/2  Weeks status post left ring finder mass removal. Site it clean, dry and intact, sliugh tenderness over incision. Pt also is complaining of BIL knee pain. A MRI and xrays were obtained from her PCP in 07/2016.   Films of Mrs. [name] is knees were obtained and will follow her health in September. I reviewed these on the PACS system. She has evidence of tricompartmental degenerative arthrosis predominantly in the lateral compartments where  there is decrease in the joint space, peripheral osteophytes, and sclerosis. She's had some trouble going up and down stairs and inclines. She has taken diclofenac that seems to make a difference.  Review of Systems   Objective: Vital Signs: LMP 01/20/2013   Physical Exam  Ortho Exam left ring finger incision has healed on the palmar aspect of the level of the PIP joint. Skin is intact. Neurovascular exam is intact distally. Full range of motion of the finger. The mass is not palpable.  Exam of both knees demonstrates some patella crepitation with mild discomfort with patellar compression. She is having a little more lateral than medial joint pain bilaterally. Full extension. Flexion over 105 without instability. No effusion palpable. No calf pain.   Specialty Comments:  No specialty comments available.  Imaging: No results found.   PMFS History: Patient Active Problem List   Diagnosis Date Noted  . Nodule of finger of left hand 11/14/2016  . Right hip pain 11/14/2016  . Chronic pain of both knees 11/14/2016  . Constipation 11/13/2016  . Preventative health care 11/13/2016  . HTN (hypertension) 11/13/2016  . Generalized anxiety disorder 03/14/2016    Class: Chronic  . Hyperglycemia 01/09/2016  . Chemotherapy-induced peripheral neuropathy ([place]) 09/21/2015  . History of chicken pox   . Shingles 05/15/2014  . Anxiety and depression 05/15/2014  . Rotator cuff tear  02/24/2014  . Hot flashes due to tamoxifen 02/20/2014  . Left shoulder pain with abduction 12/12/2013  . CVA (cerebral infarction) 07/18/2013  . TIA (transient ischemic attack) 07/18/2013  . Contracture of axilla 05/20/2013  . Primary cancer of upper inner quadrant of left female breast ([place]) 01/24/2013  . Anal fissure 05/03/2011  . Hemorrhoids, internal, with prolapse 05/03/2011   Past Medical History:  Diagnosis Date  . Anemia    Iron deficinecy anemia  . Anxiety   . Anxiety and depression 05/15/2014  .  Arthritis   . Breast cancer ([place])    left ,last radiation 2'15, last chemo 8'14  . Constipation 11/13/2016  . Depression   . History of chicken pox   . History of radiation therapy 09/09/13-10/28/13   45 gray to left breast, lumpectomy cavity boosted to 63 gray  . HTN (hypertension) 11/13/2016  . Hyperglycemia 01/09/2016  . Hypertension   . MRSA (methicillin resistant Staphylococcus aureus) 2009   right groin area-no issues now. 04-07-14 PCR screen negative today.  . Neuropathy ([place])   . Preventative health care 11/13/2016    Family History  Problem Relation Age of Onset  . Lung cancer Father   . Hypertension Father   . Thyroid cancer Father     dx in his 84s  . Cancer Father     lung, thyroid, smoker  . Breast cancer Paternal Aunt 48  . Colon cancer Paternal Aunt     dx in her 50x  . Cervical cancer Paternal Aunt     dzx in her 25s  . Ovarian cancer Cousin     dx in her lage 33s  . Breast cancer Cousin     maternal first cousin, once removed; dx in her late 17s  . Breast cancer Cousin     maternal first cousin once removed; dx in late 6s  . Hypertension Mother   . Diabetes Mother   . Dementia Mother   . Hypertension Brother   . Seizures Brother     Alcohol induced.  . Alcohol abuse Brother     drinker, smoker  . Cancer Paternal Uncle     oral cancer  . Kidney cancer Paternal Grandmother   . Cancer Cousin     several paternal cousins with brain cancer, leukemia, and other cancers  . Arthritis Daughter     back surgery    Past Surgical History:  Procedure Laterality Date  . ANAL SPHINCTEROTOMY  04/2011  . APPENDECTOMY  1980  . AXILLARY LYMPH NODE DISSECTION Left 02/04/2013   Procedure: LEFT AXILLARY LYMPH NODE DISSECTION;  Surgeon: Stark Klein, MD;  Location: [facility];  Service: General;  Laterality: Left;  End: [id number]  . BREAST LUMPECTOMY WITH NEEDLE LOCALIZATION Left 02/04/2013   Procedure: LEFT BREAST LUMPECTOMY WITH NEEDLE LOCALIZATION;  Surgeon: Stark Klein, MD;  Location:  [facility];  Service: General;  Laterality: Left;  . BREAST SURGERY     Lumpectomy in april 2014  . HEMORRHOID SURGERY  04/2011   ligation  . PORT-A-CATH REMOVAL N/A 04/16/2014   Procedure: REMOVAL PORT-A-CATH;  Surgeon: Stark Klein, MD;  Location[facility];  Service: General;  Laterality: N/A;  . PORTACATH PLACEMENT Right 02/04/2013   Procedure: INSERTION PORT-A-CATH;  Surgeon: Stark Klein, MD;  Location: [facility];  Service: General;  Laterality: Right;  Start Time: [id number].  Marland Kitchen SHOULDER ARTHROSCOPY WITH ROTATOR CUFF REPAIR AND SUBACROMIAL DECOMPRESSION Left 02/24/2014   Procedure: SHOULDER ARTHROSCOPY WITH ROTATOR CUFF REPAIR AND SUBACROMIAL DECOMPRESSION;  Surgeon:  Meredith Pel, MD;  Location: [facility];  Service: Orthopedics;  Laterality: Left;  LEFT SHOULDER DIAGNOSTIC OPERATIVE ARTHROSCOPY, SUBACROMIAL DECOMPRESSION, ROTATOR CUFF TEAR REPAIR   Social History   Occupational History  . [employer]   Social History Main Topics  . Smoking status: Never Smoker  . Smokeless tobacco: Never Used  . Alcohol use No  . Drug use: No  . Sexual activity: No     Comment: lives with husband, disability/retirement. [employer], no dietary restrictions

## 2016-12-27 DIAGNOSIS — I890 Lymphedema, not elsewhere classified: Secondary | ICD-10-CM | POA: Diagnosis not present

## 2016-12-27 DIAGNOSIS — C50212 Malignant neoplasm of upper-inner quadrant of left female breast: Secondary | ICD-10-CM | POA: Diagnosis not present

## 2017-01-02 DIAGNOSIS — F331 Major depressive disorder, recurrent, moderate: Secondary | ICD-10-CM | POA: Diagnosis not present

## 2017-01-03 ENCOUNTER — Encounter: Payer: Self-pay | Admitting: Physical Therapy

## 2017-01-03 ENCOUNTER — Ambulatory Visit: Payer: BC Managed Care – PPO | Attending: General Surgery | Admitting: Physical Therapy

## 2017-01-03 DIAGNOSIS — R293 Abnormal posture: Secondary | ICD-10-CM | POA: Insufficient documentation

## 2017-01-03 DIAGNOSIS — I890 Lymphedema, not elsewhere classified: Secondary | ICD-10-CM | POA: Diagnosis not present

## 2017-01-03 NOTE — Therapy (Signed)
Kristi [name], [name], [address] Phone: [phone]   Fax:  [phone]  Physical Therapy Evaluation  Patient Details  Name: Kristi [name] MRN: [id number] Date of Birth: 1956/01/02 Referring Provider: Barry Dienes  Encounter Date: 01/03/2017      PT End of Session - 01/03/17 1143    Visit Number 1   Number of Visits 12   Date for PT Re-Evaluation 01/31/17   PT Start Time 1016   PT Stop Time 1135   PT Time Calculation (min) 79 min   Activity Tolerance Patient tolerated treatment well   Behavior During Therapy [facility] for tasks assessed/performed      Past Medical History:  Diagnosis Date  . Anemia    Iron deficinecy anemia  . Anxiety   . Anxiety and depression 05/15/2014  . Arthritis   . Breast cancer ([place])    left ,last radiation 2'15, last chemo 8'14  . Constipation 11/13/2016  . Depression   . History of chicken pox   . History of radiation therapy 09/09/13-10/28/13   45 gray to left breast, lumpectomy cavity boosted to 63 gray  . HTN (hypertension) 11/13/2016  . Hyperglycemia 01/09/2016  . Hypertension   . MRSA (methicillin resistant Staphylococcus aureus) 2009   right groin area-no issues now. 04-07-14 PCR screen negative today.  . Neuropathy ([place])   . Preventative health care 11/13/2016    Past Surgical History:  Procedure Laterality Date  . ANAL SPHINCTEROTOMY  04/2011  . APPENDECTOMY  1980  . AXILLARY LYMPH NODE DISSECTION Left 02/04/2013   Procedure: LEFT AXILLARY LYMPH NODE DISSECTION;  Surgeon: Stark Klein, MD;  Location: [facility];  Service: General;  Laterality: Left;  End: [id number]  . BREAST LUMPECTOMY WITH NEEDLE LOCALIZATION Left 02/04/2013   Procedure: LEFT BREAST LUMPECTOMY WITH NEEDLE LOCALIZATION;  Surgeon: Stark Klein, MD;  Location: [facility];  Service: General;  Laterality: Left;  . BREAST SURGERY     Lumpectomy in april 2014  . HEMORRHOID SURGERY  04/2011   ligation  . PORT-A-CATH REMOVAL N/A  04/16/2014   Procedure: REMOVAL PORT-A-CATH;  Surgeon: Stark Klein, MD;  [facility];  Service: General;  Laterality: N/A;  . PORTACATH PLACEMENT Right 02/04/2013   Procedure: INSERTION PORT-A-CATH;  Surgeon: Stark Klein, MD;  Location: [facility];  Service: General;  Laterality: Right;  Start Time: [id number].  Marland Kitchen SHOULDER ARTHROSCOPY WITH ROTATOR CUFF REPAIR AND SUBACROMIAL DECOMPRESSION Left 02/24/2014   Procedure: SHOULDER ARTHROSCOPY WITH ROTATOR CUFF REPAIR AND SUBACROMIAL DECOMPRESSION;  Surgeon: Meredith Pel, MD;  Location: [facility];  Service: Orthopedics;  Laterality: Left;  LEFT SHOULDER DIAGNOSTIC OPERATIVE ARTHROSCOPY, SUBACROMIAL DECOMPRESSION, ROTATOR CUFF TEAR REPAIR    There were no vitals filed for this visit.       Subjective Assessment - 01/03/17 1023    Subjective I have the machine but it doesn't really seem to be helping. It is the [name]. I use it once every other day. They switched it to 24 min but I do 48 min. It is 24 min cycle because my arm is not swelling just my breast. I had been doing 60 min with the whole arm but then they changed it for me to just focus on my chest. I can not wear a compression bra. I have tried. I can not wear the compression bra because of the tightness around my midsection. I have two different compression bras they are a little different but not very much.    Pertinent  History Left lumpectomy and axillary lymph node dissection 02/04/13 followed by chemotherapy and radiation to left breast and axilla. She has struggled significantly with bilateral feet neuropathy, anxiety and depression. History includes of rotator cuff surgery on left arm    Patient Stated Goals for my breast to feel better and more comfortable to where I can tolerate it   Currently in Pain? No/denies   Pain Score 0-No pain   Effect of Pain on Daily Activities pt states she has pain in her breast when she tries to do her household chores and limits her ability to complete these             [facility] PT Assessment - 01/03/17 0001      Assessment   Medical Diagnosis left breast cancer   Onset Date/Surgical Date 02/05/13   Hand Dominance Left   Prior Therapy prior lymphedema therapy in 2015, and 2016 (eval only), 2017     Precautions   Precautions Other (comment)  lymphedema     Restrictions   Weight Bearing Restrictions No     Balance Screen   Has the patient fallen in the past 6 months No   Has the patient had a decrease in activity level because of a fear of falling?  No   Is the patient reluctant to leave their home because of a fear of falling?  No     Home Environment   Living Environment Private residence   Living Arrangements Spouse/significant other   Available Help at Discharge Family   Type of [place] to enter   Entrance Stairs-Number of Steps 3   Entrance Stairs-Rails None   Home Layout Two level   Alternate Level Stairs-Number of Steps 14   Alternate Level Stairs-Rails Left   [place] - single point     Prior Function   Level of Independence Independent   Vocation On disability   Leisure pt reports she does not exercise, she has a bike at home but her knees are giving her trouble     Cognition   Overall Cognitive Status Within Functional Limits for tasks assessed     Observation/Other Assessments   Other Surveys  --  LLIS: 50% impairment     AROM   Right Shoulder Flexion 153 Degrees   Right Shoulder ABduction 156 Degrees   Right Shoulder Internal Rotation 55 Degrees   Right Shoulder External Rotation 74 Degrees   Left Shoulder Flexion 142 Degrees   Left Shoulder ABduction 158 Degrees   Left Shoulder Internal Rotation 62 Degrees   Left Shoulder External Rotation 82 Degrees           LYMPHEDEMA/ONCOLOGY QUESTIONNAIRE - 01/03/17 1039      Type   Cancer Type Left breast     Surgeries   Lumpectomy Date 02/04/13   Axillary Lymph Node Dissection Date 02/04/13   Number Lymph Nodes  Removed 19     Date Lymphedema/Swelling Started   Date 05/14/14     Treatment   Active Chemotherapy Treatment No   Past Chemotherapy Treatment Yes   Date 02/23/13   Active Radiation Treatment No   Past Radiation Treatment Yes   Date 10/28/13   Body Site left chest   Current Hormone Treatment Yes   Drug Name Tamoxifen     What other symptoms do you have   Are you Having Heaviness or Tightness Yes   Are you having Pain Yes   Are you having  pitting edema No   Is it Hard or Difficult finding clothes that fit Yes   Do you have infections No     Lymphedema Assessments   Lymphedema Assessments Upper extremities     Right Upper Extremity Lymphedema   15 cm Proximal to Olecranon Process 28.5 cm   Olecranon Process 23.5 cm   15 cm Proximal to Ulnar Styloid Process 24 cm   Just Proximal to Ulnar Styloid Process 15 cm   Across Hand at [employer] 17.5 cm   At [place] of 2nd Digit 6.2 cm     Left Upper Extremity Lymphedema   15 cm Proximal to Olecranon Process 30 cm   Olecranon Process 26 cm   15 cm Proximal to Ulnar Styloid Process 25.6 cm   Just Proximal to Ulnar Styloid Process 15.5 cm   Across Hand at [employer] 18.4 cm   At [place] of 2nd Digit 6.4 cm                [facility] Adult PT Treatment/Exercise - 01/03/17 0001      Manual Therapy   Manual Lymphatic Drainage (MLD) short neck, superficial and deep abdominals, stimulation of left inguinal and right axillary nodes, established interaxillary and left axillo inguinal anastomoses, drainage of left breast and left upper quadrant moving fluid towards pathways, then in right sidelying worked on right axillo inguinal anastomosis and drainage of lateral breast, also establishment of posterior inter axillary pathway, back to supine and finshed with inter axillary and axillo inguinal anastomosis                PT Education - 01/03/17 1142    Education provided Yes   Education Details use FlexiTouch daily vs every  other day, wear foam chip pack in bra for compression until we can assess compression bras, sleep in something supportive at night for breast ie tank top with foam chip pack, importance of wearing a compression bra for long term management of edema   Person(s) Educated Patient   Methods Explanation   Comprehension Verbalized understanding           Short Term Clinic Goals - 02/28/16 1714      CC Short Term Goal  #1   Title Pt to be able to verbalize lymphedema risk reduction practices   Time 4   Period Weeks   Status New     CC Short Term Goal  #2   Title Pt to report decreased pain of less than 3/10 to improve comfort and allow increased use of LUE   Baseline 4   Time 4   Period Weeks   Status New             Long Term Clinic Goals - 01/03/17 1158      CC Long Term Goal  #1   Title Pt to have an appropriate compression garment for management of left breast lymphedema   Time 4   Period Weeks   Status New     CC Long Term Goal  #2   Title Pt to report a 30% improvement in left breast swelling for improved comfort    Time 4   Period Weeks   Status New     CC Long Term Goal  #3   Title Pt will have an improved LLIS impairment score of less than 40% to allow for increased comfort   Baseline 50% impairment   Time 4   Period Weeks   Status  New            Plan - 01/03/17 1144    Clinical Impression Statement Patient presents to PT with exacerbation of left breast swelling. She has been treated at this clinic before with the latest being last year. Her left breast is significantly larger than her right and fibrosis is noted in medial area of left breast. She has difficulty wearing a seatbelt due to discomfort across her chest and breast in area of swelling. She also demonstrates swelling in area superior to left breast which she states has started within the last month. She has been using her FLexiTouch every other day and was encouraged to use it daily. She has  not been wearing any compression because she states she can not tolerate it. Educated pt on importance of wearing compression bra for management of swelling. Constructed foam chip pack for pt to wear in her current non compression bra for some compression. Pt would benefit from skilled PT services for management of left breast lymphedema. This evaluation was of moderate complexity due to pt's history of depression and anxiety which affect pt's ability to follow through with aspects of self care for lymphedema. Her situation is evolving especially with recent worsening of edema.    Rehab Potential Good   Clinical Impairments Affecting Rehab Potential previous radiation,CIPN and breast lymphedema with previous treatment   PT Frequency 3x / week   PT Duration 4 weeks   PT Treatment/Interventions Manual techniques;Manual lymph drainage;ADLs/Self Care Home Management;Patient/family education;Taping   PT Next Visit Plan MLD for left breast, assess compression bras for proper fit and if pt needs a different bra due to these being uncomfortable, ask about how foam chip pack worked and if pt has increased FlexiTouch to daily   Consulted and Agree with Plan of Care Patient      Patient will benefit from skilled therapeutic intervention in order to improve the following deficits and impairments:  Pain, Increased edema  Visit Diagnosis: Lymphedema, not elsewhere classified - Plan: PT plan of care cert/re-cert  Abnormal posture - Plan: PT plan of care cert/re-cert      G-Codes - [id number] 02-09-1205    Functional Assessment Tool Used (Outpatient Only) LLIS   Functional Limitation Other PT primary   Other PT Primary Current Status [id number]) At least 40 percent but less than 60 percent impaired, limited or restricted   Other PT Primary Goal Status [id number]) At least 20 percent but less than 40 percent impaired, limited or restricted       Problem List Patient Active Problem List   Diagnosis Date Noted  .  Nodule of finger of left hand 11/14/2016  . Right hip pain 11/14/2016  . Chronic pain of both knees 11/14/2016  . Constipation 11/13/2016  . Preventative health care 11/13/2016  . HTN (hypertension) 11/13/2016  . Generalized anxiety disorder 03/14/2016    Class: Chronic  . Hyperglycemia 01/09/2016  . Chemotherapy-induced peripheral neuropathy ([place]) 09/21/2015  . History of chicken pox   . Shingles 05/15/2014  . Anxiety and depression 05/15/2014  . Rotator cuff tear 02/24/2014  . Hot flashes due to tamoxifen 02/20/2014  . Left shoulder pain with abduction 12/12/2013  . CVA (cerebral infarction) 07/18/2013  . TIA (transient ischemic attack) 07/18/2013  . Contracture of axilla 05/20/2013  . Primary cancer of upper inner quadrant of left female breast ([place]) 01/24/2013  . Anal fissure 05/03/2011  . Hemorrhoids, internal, with prolapse 05/03/2011    Allyson Sabal [facility] 01/03/2017, 12:11  PM  [place], [name], [address] Phone: [phone]   Fax:  [phone]  Name: Kristi [name] MRN: [id number] Date of Birth: 06-Mar-1956  Manus Gunning, PT 01/03/17 12:11 PM

## 2017-01-08 DIAGNOSIS — F331 Major depressive disorder, recurrent, moderate: Secondary | ICD-10-CM | POA: Diagnosis not present

## 2017-01-09 ENCOUNTER — Ambulatory Visit: Payer: BC Managed Care – PPO | Attending: General Surgery | Admitting: Physical Therapy

## 2017-01-09 DIAGNOSIS — G8929 Other chronic pain: Secondary | ICD-10-CM | POA: Diagnosis not present

## 2017-01-09 DIAGNOSIS — I890 Lymphedema, not elsewhere classified: Secondary | ICD-10-CM | POA: Insufficient documentation

## 2017-01-09 DIAGNOSIS — M25612 Stiffness of left shoulder, not elsewhere classified: Secondary | ICD-10-CM | POA: Diagnosis not present

## 2017-01-09 DIAGNOSIS — M25512 Pain in left shoulder: Secondary | ICD-10-CM | POA: Diagnosis not present

## 2017-01-09 DIAGNOSIS — R293 Abnormal posture: Secondary | ICD-10-CM | POA: Diagnosis not present

## 2017-01-09 NOTE — Patient Instructions (Signed)
Kinesiotape should stay on your body for a few days.  It's OK to shower with it on.  Just pat it dry afterwards.    While the tape is on, keep an eye on the skin around it.  If you feel any itching, see any redness or feel in any way that the tape is irritating your skin, it is time to gently remove it.   To loosen the tape from your skin, use something oily like olive oil or baby oil on a cotton ball.  Gently roll the edge of the tape away from the skin.  Then, hold the edge of the tape away from the skin and gently press the skin away from the tape.  Do not pull the tape off the skin or you may cause skin irritation.  Then, inspect your skin.  Gently cleanse with soap and water  

## 2017-01-09 NOTE — Therapy (Signed)
[place], [name], [address] Phone: [phone]   Fax:  [phone]  Physical Therapy Treatment  Patient Details  Name: Kristi [name] MRN: [id number] Date of Birth: [birth year] Referring Provider: Barry Dienes  Encounter Date: 01/09/2017      PT End of Session - 01/09/17 1617    Visit Number 2   Number of Visits 12   Date for PT Re-Evaluation 01/31/17   PT Start Time 1330   PT Stop Time 1415   PT Time Calculation (min) 45 min   Activity Tolerance Patient tolerated treatment well   Behavior During Therapy [facility] for tasks assessed/performed      Past Medical History:  Diagnosis Date  . Anemia    Iron deficinecy anemia  . Anxiety   . Anxiety and depression 05/15/2014  . Arthritis   . Breast cancer ([place])    left ,last radiation 2'15, last chemo 8'14  . Constipation 11/13/2016  . Depression   . History of chicken pox   . History of radiation therapy 09/09/13-10/28/13   45 gray to left breast, lumpectomy cavity boosted to 63 gray  . HTN (hypertension) 11/13/2016  . Hyperglycemia 01/09/2016  . Hypertension   . MRSA (methicillin resistant Staphylococcus aureus) 2009   right groin area-no issues now. 04-07-14 PCR screen negative today.  . Neuropathy ([place])   . Preventative health care 11/13/2016    Past Surgical History:  Procedure Laterality Date  . ANAL SPHINCTEROTOMY  04/2011  . APPENDECTOMY  1980  . AXILLARY LYMPH NODE DISSECTION Left 02/04/2013   Procedure: LEFT AXILLARY LYMPH NODE DISSECTION;  Surgeon: Stark Klein, MD;  Location: [place];  Service: General;  Laterality: Left;  End: [id number]  . BREAST LUMPECTOMY WITH NEEDLE LOCALIZATION Left 02/04/2013   Procedure: LEFT BREAST LUMPECTOMY WITH NEEDLE LOCALIZATION;  Surgeon: Stark Klein, MD;  Location: [place];  Service: General;  Laterality: Left;  . BREAST SURGERY     Lumpectomy in april 2014  . HEMORRHOID SURGERY  04/2011   ligation  . PORT-A-CATH REMOVAL N/A  04/16/2014   Procedure: REMOVAL PORT-A-CATH;  Surgeon: Stark Klein, MD;  [facility];  Service: General;  Laterality: N/A;  . PORTACATH PLACEMENT Right 02/04/2013   Procedure: INSERTION PORT-A-CATH;  Surgeon: Stark Klein, MD;  Location: [place];  Service: General;  Laterality: Right;  Start Time: [id number].  Marland Kitchen SHOULDER ARTHROSCOPY WITH ROTATOR CUFF REPAIR AND SUBACROMIAL DECOMPRESSION Left 02/24/2014   Procedure: SHOULDER ARTHROSCOPY WITH ROTATOR CUFF REPAIR AND SUBACROMIAL DECOMPRESSION;  Surgeon: Meredith Pel, MD;  Location: [place];  Service: Orthopedics;  Laterality: Left;  LEFT SHOULDER DIAGNOSTIC OPERATIVE ARTHROSCOPY, SUBACROMIAL DECOMPRESSION, ROTATOR CUFF TEAR REPAIR    There were no vitals filed for this visit.      Subjective Assessment - 01/09/17 1342    Subjective Pt states she used the Flexitouch this morning.  She said she felt a little better after she used it and felt a little bit looser.  The chip pack was not effective as it did not stay in place.  She has not been able to tolerate a compression bra. She is willing to try kinesiotapr today.  She still has occasional neuropathy symptoms in her left hand.    Pertinent History Left lumpectomy and axillary lymph node dissection 02/04/13 followed by chemotherapy and radiation to left breast and axilla. She has struggled significantly with bilateral feet neuropathy, anxiety and depression. History includes of rotator cuff surgery on left arm  Patient Stated Goals for my breast to feel better and more comfortable to where I can tolerate it   Currently in Pain? No/denies                         [facility] Adult PT Treatment/Exercise - 01/09/17 0001      Self-Care   Self-Care Other Self-Care Comments   Other Self-Care Comments  encouraged pt to wear a snug fitting cami to see if she could tolerate that, to continue with shoulder exercises and to bring in her Belisse bra next visit.     Shoulder Exercises: Sidelying    ABduction AROM;Left;5 reps   Other Sidelying Exercises small circles pointing to the ceiling in each direction      Manual Therapy   Manual Lymphatic Drainage (MLD) short neck, superficial and deep abdominals, stimulation of left inguinal and right axillary nodes, established interaxillary and left axillo inguinal anastomoses, drainage of left breast and left upper quadrant moving fluid towards pathways, then in right sidelying worked on right axillo inguinal anastomosis and drainage of lateral breast, also establishment of posterior inter axillary pathway, back to supine and finshed with inter axillary and axillo inguinal anastomosis   Kinesiotex Edema     Kinesiotix   Edema skinkote applies to left breast, adomen in fan shape toward inguinal nodes.                 PT Education - 01/09/17 1615    Education provided Yes   Education Details removed kinesiotape at any sign of skin irritation                 [facility] Goals - 01/03/17 1158      CC Long Term Goal  #1   Title Pt to have an appropriate compression garment for management of left breast lymphedema   Time 4   Period Weeks   Status New     CC Long Term Goal  #2   Title Pt to report a 30% improvement in left breast swelling for improved comfort    Time 4   Period Weeks   Status New     CC Long Term Goal  #3   Title Pt will have an improved LLIS impairment score of less than 40% to allow for increased comfort   Baseline 50% impairment   Time 4   Period Weeks   Status New            Plan - 01/09/17 1618    Clinical Impression Statement Pt still not able to tolerate compression to left breast, so tried kinesiotape today to see if she can identify some decongestion in left breast. Encouraged her to consider other methods of compression and to bring in compression bra next visit. Pt noted to have tightness in left axilla.    Clinical Impairments Affecting Rehab Potential previous radiation,CIPN  and breast lymphedema with previous treatment   PT Treatment/Interventions Manual techniques;Manual lymph drainage;ADLs/Self Care Home Management;Patient/family education;Taping   PT Next Visit Plan MLD for left breast, assess compression bras for proper fit. Assess effect of kinesiotape.  Try myofascial techniuques to left axillary area    Consulted and Agree with Plan of Care Patient      Patient will benefit from skilled therapeutic intervention in order to improve the following deficits and impairments:  Pain, Increased edema  Visit Diagnosis: Abnormal posture  Stiffness of left shoulder, not elsewhere classified  Problem List Patient Active Problem List   Diagnosis Date Noted  . Nodule of finger of left hand 11/14/2016  . Right hip pain 11/14/2016  . Chronic pain of both knees 11/14/2016  . Constipation 11/13/2016  . Preventative health care 11/13/2016  . HTN (hypertension) 11/13/2016  . Generalized anxiety disorder 03/14/2016    Class: Chronic  . Hyperglycemia 01/09/2016  . Chemotherapy-induced peripheral neuropathy ([place]) 09/21/2015  . History of chicken pox   . Shingles 05/15/2014  . Anxiety and depression 05/15/2014  . Rotator cuff tear 02/24/2014  . Hot flashes due to tamoxifen 02/20/2014  . Left shoulder pain with abduction 12/12/2013  . CVA (cerebral infarction) 07/18/2013  . TIA (transient ischemic attack) 07/18/2013  . Contracture of axilla 05/20/2013  . Primary cancer of upper inner quadrant of left female breast ([place]) 01/24/2013  . Anal fissure 05/03/2011  . Hemorrhoids, internal, with prolapse 05/03/2011   Donato Heinz. Owens Shark PT  [name] 01/09/2017, 4:22 PM  [place], [name], [address] Phone: [phone]   Fax:  [phone]  Name: Kristi [name] MRN: [id number] Date of Birth: Dec 07, 1955

## 2017-01-10 ENCOUNTER — Telehealth: Payer: Self-pay | Admitting: Emergency Medicine

## 2017-01-10 NOTE — Telephone Encounter (Signed)
Please make her an appt to see me or Mendel Ryder Thanks

## 2017-01-10 NOTE — Telephone Encounter (Signed)
Patient called complaining of upper and lower extremity neuropathy; patient currently taking neurontin 800mg  TID for this. Patient has seen neurology in 2015 for this and they tried her on Lyrica and the patient couldn't tolerate this. Patient states they also did "a nerve test". Patient denies any difficulty with ADL's; states her main complaint is the severe pain, burning and tingling that she experiences. Patient scheduled to see Dr [name] 05/16/17; will advise her if she needs to do anything different in the meantime.

## 2017-01-11 ENCOUNTER — Ambulatory Visit: Payer: Self-pay

## 2017-01-12 ENCOUNTER — Ambulatory Visit: Payer: BC Managed Care – PPO | Admitting: Physical Therapy

## 2017-01-12 DIAGNOSIS — M25512 Pain in left shoulder: Secondary | ICD-10-CM | POA: Diagnosis not present

## 2017-01-12 DIAGNOSIS — M25612 Stiffness of left shoulder, not elsewhere classified: Secondary | ICD-10-CM

## 2017-01-12 DIAGNOSIS — R293 Abnormal posture: Secondary | ICD-10-CM | POA: Diagnosis not present

## 2017-01-12 DIAGNOSIS — G8929 Other chronic pain: Secondary | ICD-10-CM | POA: Diagnosis not present

## 2017-01-12 DIAGNOSIS — I890 Lymphedema, not elsewhere classified: Secondary | ICD-10-CM | POA: Diagnosis not present

## 2017-01-12 NOTE — Therapy (Signed)
[place], [name], [address] Phone: [phone]   Fax:  [phone]  Physical Therapy Treatment  Patient Details  Name: Kristi [name] MRN: [id number] Date of Birth: Jul 01, 1956 Referring Provider: Barry Dienes  Encounter Date: 01/12/2017      PT End of Session - 01/12/17 1214    Visit Number 3   Number of Visits 12   Date for PT Re-Evaluation 01/31/17   PT Start Time 0930   PT Stop Time 1015   PT Time Calculation (min) 45 min   Activity Tolerance Patient tolerated treatment well   Behavior During Therapy [facility] for tasks assessed/performed      Past Medical History:  Diagnosis Date  . Anemia    Iron deficinecy anemia  . Anxiety   . Anxiety and depression 05/15/2014  . Arthritis   . Breast cancer ([place])    left ,last radiation 2'15, last chemo 8'14  . Constipation 11/13/2016  . Depression   . History of chicken pox   . History of radiation therapy 09/09/13-10/28/13   45 gray to left breast, lumpectomy cavity boosted to 63 gray  . HTN (hypertension) 11/13/2016  . Hyperglycemia 01/09/2016  . Hypertension   . MRSA (methicillin resistant Staphylococcus aureus) 2009   right groin area-no issues now. 04-07-14 PCR screen negative today.  . Neuropathy ([place])   . Preventative health care 11/13/2016    Past Surgical History:  Procedure Laterality Date  . ANAL SPHINCTEROTOMY  04/2011  . APPENDECTOMY  1980  . AXILLARY LYMPH NODE DISSECTION Left 02/04/2013   Procedure: LEFT AXILLARY LYMPH NODE DISSECTION;  Surgeon: Stark Klein, MD;  Location: [place];  Service: General;  Laterality: Left;  End: 9767  . BREAST LUMPECTOMY WITH NEEDLE LOCALIZATION Left 02/04/2013   Procedure: LEFT BREAST LUMPECTOMY WITH NEEDLE LOCALIZATION;  Surgeon: Stark Klein, MD;  Location: [place];  Service: General;  Laterality: Left;  . BREAST SURGERY     Lumpectomy in april 2014  . HEMORRHOID SURGERY  04/2011   ligation  . PORT-A-CATH REMOVAL N/A  04/16/2014   Procedure: REMOVAL PORT-A-CATH;  Surgeon: Stark Klein, MD;  [facility];  Service: General;  Laterality: N/A;  . PORTACATH PLACEMENT Right 02/04/2013   Procedure: INSERTION PORT-A-CATH;  Surgeon: Stark Klein, MD;  Location: [place];  Service: General;  Laterality: Right;  Start Time: 3419.  Marland Kitchen SHOULDER ARTHROSCOPY WITH ROTATOR CUFF REPAIR AND SUBACROMIAL DECOMPRESSION Left 02/24/2014   Procedure: SHOULDER ARTHROSCOPY WITH ROTATOR CUFF REPAIR AND SUBACROMIAL DECOMPRESSION;  Surgeon: Meredith Pel, MD;  Location: [place];  Service: Orthopedics;  Laterality: Left;  LEFT SHOULDER DIAGNOSTIC OPERATIVE ARTHROSCOPY, SUBACROMIAL DECOMPRESSION, ROTATOR CUFF TEAR REPAIR    There were no vitals filed for this visit.      Subjective Assessment - 01/12/17 0943    Subjective Pt took off the kinesiotape last night . she feels that it might have helped a little bit and wants to try it again She is still using the Flexitouch.  She brings in her Belisse bra but it does not fit well and is not comfortable to paient.                          [place] Adult PT Treatment/Exercise - 01/12/17 0001      Self-Care   Other Self-Care Comments  tried on the Belisse bra.  The size is an A- B cup and does not appear to fit the breast well.  Shoulder Exercises: Pulleys   Flexion 2 minutes   Flexion Limitations extra stretch to left shoulder in flexion with 15 second hold, deep  breath and head rotation      Manual Therapy   Manual Lymphatic Drainage (MLD) short neck, superficial and deep abdominals, stimulation of left inguinal and right axillary nodes, established interaxillary and left axillo inguinal anastomoses, drainage of left breast and left upper quadrant moving fluid towards pathways, then in right sidelying worked on right axillo inguinal anastomosis and drainage of lateral breast, also establishment of posterior inter axillary pathway, back to supine and finshed with inter  axillary and axillo inguinal anastomosis   Kinesiotex Edema     Kinesiotix   Edema skinkote applies to left breast, adomen in fan shape toward inguinal nodes.                         [facility] Goals - 01/03/17 1158      CC Long Term Goal  #1   Title Pt to have an appropriate compression garment for management of left breast lymphedema   Time 4   Period Weeks   Status New     CC Long Term Goal  #2   Title Pt to report a 30% improvement in left breast swelling for improved comfort    Time 4   Period Weeks   Status New     CC Long Term Goal  #3   Title Pt will have an improved LLIS impairment score of less than 40% to allow for increased comfort   Baseline 50% impairment   Time 4   Period Weeks   Status New            Plan - 01/12/17 1215    Clinical Impression Statement Pt seems a little better today with less fullness at infeior left breast.  She still is not able to tolerate compression bra.  Continued with kinesiotape and added more shoulder stretches today    Rehab Potential Good   Clinical Impairments Affecting Rehab Potential previous radiation,CIPN and breast lymphedema with previous treatment   PT Frequency 3x / week   PT Duration 4 weeks   PT Treatment/Interventions Manual techniques;Manual lymph drainage;ADLs/Self Care Home Management;Patient/family education;Taping   PT Next Visit Plan MLD for left breast, assess Continue with  kinesiotape.  Try myofascial techniuques to left axillary area with stretches    Consulted and Agree with Plan of Care Patient      Patient will benefit from skilled therapeutic intervention in order to improve the following deficits and impairments:  Pain, Increased edema  Visit Diagnosis: Abnormal posture  Stiffness of left shoulder, not elsewhere classified  Lymphedema, not elsewhere classified  Chronic left shoulder pain     Problem List Patient Active Problem List   Diagnosis Date Noted  .  Nodule of finger of left hand 11/14/2016  . Right hip pain 11/14/2016  . Chronic pain of both knees 11/14/2016  . Constipation 11/13/2016  . Preventative health care 11/13/2016  . HTN (hypertension) 11/13/2016  . Generalized anxiety disorder 03/14/2016    Class: Chronic  . Hyperglycemia 01/09/2016  . Chemotherapy-induced peripheral neuropathy ([place]) 09/21/2015  . History of chicken pox   . Shingles 05/15/2014  . Anxiety and depression 05/15/2014  . Rotator cuff tear 02/24/2014  . Hot flashes due to tamoxifen 02/20/2014  . Left shoulder pain with abduction 12/12/2013  . CVA (cerebral infarction) 07/18/2013  . TIA (transient ischemic  attack) 07/18/2013  . Contracture of axilla 05/20/2013  . Primary cancer of upper inner quadrant of left female breast ([place]) 01/24/2013  . Anal fissure 05/03/2011  . Hemorrhoids, internal, with prolapse 05/03/2011   Donato Heinz. Owens Shark PT  [name] 01/12/2017, 12:24 PM  [place], [name], [address] Phone: [phone]   Fax:  [phone]  Name: Kristi [name] MRN: [id number] Date of Birth: 12-30-1955

## 2017-01-15 ENCOUNTER — Ambulatory Visit: Payer: BC Managed Care – PPO

## 2017-01-15 DIAGNOSIS — I890 Lymphedema, not elsewhere classified: Secondary | ICD-10-CM | POA: Diagnosis not present

## 2017-01-15 DIAGNOSIS — M25612 Stiffness of left shoulder, not elsewhere classified: Secondary | ICD-10-CM

## 2017-01-15 DIAGNOSIS — R293 Abnormal posture: Secondary | ICD-10-CM

## 2017-01-15 DIAGNOSIS — G8929 Other chronic pain: Secondary | ICD-10-CM | POA: Diagnosis not present

## 2017-01-15 DIAGNOSIS — M25512 Pain in left shoulder: Secondary | ICD-10-CM | POA: Diagnosis not present

## 2017-01-15 NOTE — Therapy (Signed)
[place], [name], [address] Phone: [phone]   Fax:  [phone]  Physical Therapy Treatment  Patient Details  Name: Kristi [name] MRN: [id number] Date of Birth: 1956/08/30 Referring Provider: Barry Dienes  Encounter Date: 01/15/2017      PT End of Session - 01/15/17 1016    Visit Number 4   Number of Visits 12   Date for PT Re-Evaluation 01/31/17   PT Start Time 0930   PT Stop Time 1016   PT Time Calculation (min) 46 min   Activity Tolerance Patient tolerated treatment well   Behavior During Therapy [facility] for tasks assessed/performed      Past Medical History:  Diagnosis Date  . Anemia    Iron deficinecy anemia  . Anxiety   . Anxiety and depression 05/15/2014  . Arthritis   . Breast cancer ([place])    left ,last radiation 2'15, last chemo 8'14  . Constipation 11/13/2016  . Depression   . History of chicken pox   . History of radiation therapy 09/09/13-10/28/13   45 gray to left breast, lumpectomy cavity boosted to 63 gray  . HTN (hypertension) 11/13/2016  . Hyperglycemia 01/09/2016  . Hypertension   . MRSA (methicillin resistant Staphylococcus aureus) 2009   right groin area-no issues now. 04-07-14 PCR screen negative today.  . Neuropathy ([place])   . Preventative health care 11/13/2016    Past Surgical History:  Procedure Laterality Date  . ANAL SPHINCTEROTOMY  04/2011  . APPENDECTOMY  1980  . AXILLARY LYMPH NODE DISSECTION Left 02/04/2013   Procedure: LEFT AXILLARY LYMPH NODE DISSECTION;  Surgeon: Stark Klein, MD;  Location: [place];  Service: General;  Laterality: Left;  End: 4332  . BREAST LUMPECTOMY WITH NEEDLE LOCALIZATION Left 02/04/2013   Procedure: LEFT BREAST LUMPECTOMY WITH NEEDLE LOCALIZATION;  Surgeon: Stark Klein, MD;  Location: [place];  Service: General;  Laterality: Left;  . BREAST SURGERY     Lumpectomy in april 2014  . HEMORRHOID SURGERY  04/2011   ligation  . PORT-A-CATH REMOVAL N/A  04/16/2014   Procedure: REMOVAL PORT-A-CATH;  Surgeon: Stark Klein, MD;  [facility];  Service: General;  Laterality: N/A;  . PORTACATH PLACEMENT Right 02/04/2013   Procedure: INSERTION PORT-A-CATH;  Surgeon: Stark Klein, MD;  Location: [place];  Service: General;  Laterality: Right;  Start Time: 9518.  Marland Kitchen SHOULDER ARTHROSCOPY WITH ROTATOR CUFF REPAIR AND SUBACROMIAL DECOMPRESSION Left 02/24/2014   Procedure: SHOULDER ARTHROSCOPY WITH ROTATOR CUFF REPAIR AND SUBACROMIAL DECOMPRESSION;  Surgeon: Meredith Pel, MD;  Location: [place];  Service: Orthopedics;  Laterality: Left;  LEFT SHOULDER DIAGNOSTIC OPERATIVE ARTHROSCOPY, SUBACROMIAL DECOMPRESSION, ROTATOR CUFF TEAR REPAIR    There were no vitals filed for this visit.      Subjective Assessment - 01/15/17 0933    Subjective The tape is helping, I just took it off this morning. The tightness of my Lt breast feels some improved.    Pertinent History Left lumpectomy and axillary lymph node dissection 02/04/13 followed by chemotherapy and radiation to left breast and axilla. She has struggled significantly with bilateral feet neuropathy, anxiety and depression. History includes of rotator cuff surgery on left arm    Patient Stated Goals for my breast to feel better and more comfortable to where I can tolerate it   Currently in Pain? No/denies                         [facility]  Adult PT Treatment/Exercise - 01/15/17 0001      Shoulder Exercises: Pulleys   Flexion 2 minutes   Flexion Limitations tactile and VC throughout to decrese scapular compensations.   ABduction 2 minutes     Manual Therapy   Manual Lymphatic Drainage (MLD) short neck, superficial and deep abdominals, stimulation of left inguinal and right axillary nodes, established interaxillary and left axillo inguinal anastomoses, drainage of left breast and left upper quadrant moving fluid towards pathways, then in right sidelying worked on left axillo inguinal  anastomosis and drainage of lateral breast, also establishment of posterior inter axillary pathway, back to supine and finshed with inter-axillary and axillo-inguinal anastomosis   Passive ROM Into abduction briefly during manual lymph drainage with axillary release   Kinesiotex Edema     Kinesiotix   Edema skinkote applied to left breast, abdomen in fan shape toward inguinal nodes.                         [facility] Goals - 01/03/17 1158      CC Long Term Goal  #1   Title Pt to have an appropriate compression garment for management of left breast lymphedema   Time 4   Period Weeks   Status New     CC Long Term Goal  #2   Title Pt to report a 30% improvement in left breast swelling for improved comfort    Time 4   Period Weeks   Status New     CC Long Term Goal  #3   Title Pt will have an improved LLIS impairment score of less than 40% to allow for increased comfort   Baseline 50% impairment   Time 4   Period Weeks   Status New            Plan - 01/15/17 1016    Clinical Impression Statement Pt continues to seem some improved per her report since last week. She reports feeling like the kinesiotape is beneficial so continued with that today. Also noticed that her fibrosis seems much improved since this therapist last saw her during her last episode of care and pt agrees breat is softer, just feels very tight and heavy.    Rehab Potential Good   Clinical Impairments Affecting Rehab Potential previous radiation,CIPN and breast lymphedema with previous treatment   PT Frequency 3x / week   PT Duration 4 weeks   PT Treatment/Interventions Manual techniques;Manual lymph drainage;ADLs/Self Care Home Management;Patient/family education;Taping   PT Next Visit Plan MLD for left breast, assess Continue with  kinesiotape.  Try myofascial techniuques to left axillary area with stretches    Consulted and Agree with Plan of Care Patient      Patient will benefit  from skilled therapeutic intervention in order to improve the following deficits and impairments:  Pain, Increased edema  Visit Diagnosis: Abnormal posture  Stiffness of left shoulder, not elsewhere classified  Lymphedema, not elsewhere classified     Problem List Patient Active Problem List   Diagnosis Date Noted  . Nodule of finger of left hand 11/14/2016  . Right hip pain 11/14/2016  . Chronic pain of both knees 11/14/2016  . Constipation 11/13/2016  . Preventative health care 11/13/2016  . HTN (hypertension) 11/13/2016  . Generalized anxiety disorder 03/14/2016    Class: Chronic  . Hyperglycemia 01/09/2016  . Chemotherapy-induced peripheral neuropathy ([place]) 09/21/2015  . History of chicken pox   . Shingles 05/15/2014  .  Anxiety and depression 05/15/2014  . Rotator cuff tear 02/24/2014  . Hot flashes due to tamoxifen 02/20/2014  . Left shoulder pain with abduction 12/12/2013  . CVA (cerebral infarction) 07/18/2013  . TIA (transient ischemic attack) 07/18/2013  . Contracture of axilla 05/20/2013  . Primary cancer of upper inner quadrant of left female breast ([place]) 01/24/2013  . Anal fissure 05/03/2011  . Hemorrhoids, internal, with prolapse 05/03/2011    Otelia Limes, PTA 01/15/2017, 11:23 AM  [place], [name], [address] Phone: [phone]   Fax:  [phone]  Name: BRIGETT ESTELL MRN: [id number] Date of Birth: 1956-01-22

## 2017-01-17 DIAGNOSIS — I890 Lymphedema, not elsewhere classified: Secondary | ICD-10-CM | POA: Diagnosis not present

## 2017-01-17 DIAGNOSIS — C50212 Malignant neoplasm of upper-inner quadrant of left female breast: Secondary | ICD-10-CM | POA: Diagnosis not present

## 2017-01-19 ENCOUNTER — Ambulatory Visit: Payer: BC Managed Care – PPO | Admitting: Physical Therapy

## 2017-01-19 DIAGNOSIS — R293 Abnormal posture: Secondary | ICD-10-CM

## 2017-01-19 DIAGNOSIS — I890 Lymphedema, not elsewhere classified: Secondary | ICD-10-CM | POA: Diagnosis not present

## 2017-01-19 DIAGNOSIS — M25512 Pain in left shoulder: Secondary | ICD-10-CM | POA: Diagnosis not present

## 2017-01-19 DIAGNOSIS — G8929 Other chronic pain: Secondary | ICD-10-CM

## 2017-01-19 DIAGNOSIS — M25612 Stiffness of left shoulder, not elsewhere classified: Secondary | ICD-10-CM

## 2017-01-19 NOTE — Therapy (Signed)
[place], [name], [address] Phone: [phone]   Fax:  [phone]  Physical Therapy Treatment  Patient Details  Name: Kristi [name] MRN: [id number] Date of Birth: 21-Jan-1956 Referring Provider: Barry Dienes  Encounter Date: 01/19/2017      PT End of Session - 01/19/17 1006    Visit Number 5   Number of Visits 12   Date for PT Re-Evaluation 01/31/17   PT Start Time 0925   PT Stop Time 1007   PT Time Calculation (min) 42 min   Activity Tolerance Patient tolerated treatment well   Behavior During Therapy [facility] for tasks assessed/performed      Past Medical History:  Diagnosis Date  . Anemia    Iron deficinecy anemia  . Anxiety   . Anxiety and depression 05/15/2014  . Arthritis   . Breast cancer ([place])    left ,last radiation 2'15, last chemo 8'14  . Constipation 11/13/2016  . Depression   . History of chicken pox   . History of radiation therapy 09/09/13-10/28/13   45 gray to left breast, lumpectomy cavity boosted to 63 gray  . HTN (hypertension) 11/13/2016  . Hyperglycemia 01/09/2016  . Hypertension   . MRSA (methicillin resistant Staphylococcus aureus) 2009   right groin area-no issues now. 04-07-14 PCR screen negative today.  . Neuropathy ([place])   . Preventative health care 11/13/2016    Past Surgical History:  Procedure Laterality Date  . ANAL SPHINCTEROTOMY  04/2011  . APPENDECTOMY  1980  . AXILLARY LYMPH NODE DISSECTION Left 02/04/2013   Procedure: LEFT AXILLARY LYMPH NODE DISSECTION;  Surgeon: Stark Klein, MD;  Location: [place];  Service: General;  Laterality: Left;  End: 2683  . BREAST LUMPECTOMY WITH NEEDLE LOCALIZATION Left 02/04/2013   Procedure: LEFT BREAST LUMPECTOMY WITH NEEDLE LOCALIZATION;  Surgeon: Stark Klein, MD;  Location: [place];  Service: General;  Laterality: Left;  . BREAST SURGERY     Lumpectomy in april 2014  . HEMORRHOID SURGERY  04/2011   ligation  . PORT-A-CATH REMOVAL N/A  04/16/2014   Procedure: REMOVAL PORT-A-CATH;  Surgeon: Stark Klein, MD;  Location: [facility];  Service: General;  Laterality: N/A;  . PORTACATH PLACEMENT Right 02/04/2013   Procedure: INSERTION PORT-A-CATH;  Surgeon: Stark Klein, MD;  Location: [place];  Service: General;  Laterality: Right;  Start Time: 4196.  Marland Kitchen SHOULDER ARTHROSCOPY WITH ROTATOR CUFF REPAIR AND SUBACROMIAL DECOMPRESSION Left 02/24/2014   Procedure: SHOULDER ARTHROSCOPY WITH ROTATOR CUFF REPAIR AND SUBACROMIAL DECOMPRESSION;  Surgeon: Meredith Pel, MD;  Location: [facility];  Service: Orthopedics;  Laterality: Left;  LEFT SHOULDER DIAGNOSTIC OPERATIVE ARTHROSCOPY, SUBACROMIAL DECOMPRESSION, ROTATOR CUFF TEAR REPAIR    There were no vitals filed for this visit.      Subjective Assessment - 01/19/17 0928    Subjective Pt states she used the Flexitouch last night and she is a little sore today.    Pertinent History Left lumpectomy and axillary lymph node dissection 02/04/13 followed by chemotherapy and radiation to left breast and axilla. She has struggled significantly with bilateral feet neuropathy, anxiety and depression. History includes of rotator cuff surgery on left arm    Patient Stated Goals for my breast to feel better and more comfortable to where I can tolerate it   Currently in Pain? No/denies                         [facility] Adult PT Treatment/Exercise - 01/19/17  0001      Lumbar Exercises: Supine   Other Supine Lumbar Exercises on soft foam roller : mini marches for core activation      Shoulder Exercises: Supine   Other Supine Exercises over foam roller for shoulder horizontal abduction stretch and flexion strech      Shoulder Exercises: Sidelying   ABduction AROM;Left;5 reps   Other Sidelying Exercises small circles pointing to the ceiling in each direction    Other Sidelying Exercises upper trunk rotation wtih left shoulder horizontal abduction      Manual Therapy   Manual Lymphatic Drainage  (MLD) short neck, superficial and deep abdominals, stimulation of left inguinal and right axillary nodes, established interaxillary and left axillo inguinal anastomoses, drainage of left breast and left upper quadrant moving fluid towards pathways, then in right sidelying worked on left axillo inguinal anastomosis and drainage of lateral breast, also establishment of posterior inter axillary pathway, back to supine and finshed with inter-axillary and axillo-inguinal anastomosis   Passive ROM Into abduction briefly during manual lymph drainage with axillary release   Kinesiotex Edema     Kinesiotix   Edema skinkote applied to left breast, abdomen in fan shape toward inguinal nodes.                         Long Term Clinic Goals - 01/19/17 1006      CC Long Term Goal  #1   Title Pt to have an appropriate compression garment for management of left breast lymphedema   Baseline pt not interested in wearing more compressive garments    Status Deferred     CC Long Term Goal  #2   Title Pt to report a 30% improvement in left breast swelling for improved comfort    Status On-going     CC Long Term Goal  #3   Title Pt will have an improved LLIS impairment score of less than 40% to allow for increased comfort   Baseline 50% impairment   Status On-going            Plan - 01/19/17 1146    Clinical Impression Statement Upgraded exercise program to include supine stretching over soft foam roller with prolonged time to allow for myofacial release.  Her tissue appears to be softening with palpation and she was able to tolerate passive stretching today.  Feel she is ready to increase active exercise in treatment for more lymphatic flow    Rehab Potential Good   Clinical Impairments Affecting Rehab Potential previous radiation,CIPN and breast lymphedema with previous treatment   PT Frequency 3x / week   PT Duration 4 weeks   PT Next Visit Plan MLD for left breast, assess Continue  with  kinesiotape.  Try myofascial techniuques to left axillary area with stretches  Continue with pulleys, ball up the wall and modified downard dog stretches and active shoulder standing range of motion with core engaged    Consulted and Agree with Plan of Care Patient      Patient will benefit from skilled therapeutic intervention in order to improve the following deficits and impairments:  Pain, Increased edema  Visit Diagnosis: Abnormal posture  Stiffness of left shoulder, not elsewhere classified  Lymphedema, not elsewhere classified  Chronic left shoulder pain     Problem List Patient Active Problem List   Diagnosis Date Noted  . Nodule of finger of left hand 11/14/2016  . Right hip pain 11/14/2016  . Chronic pain  of both knees 11/14/2016  . Constipation 11/13/2016  . Preventative health care 11/13/2016  . HTN (hypertension) 11/13/2016  . Generalized anxiety disorder 03/14/2016    Class: Chronic  . Hyperglycemia 01/09/2016  . Chemotherapy-induced peripheral neuropathy ([place]) 09/21/2015  . History of chicken pox   . Shingles 05/15/2014  . Anxiety and depression 05/15/2014  . Rotator cuff tear 02/24/2014  . Hot flashes due to tamoxifen 02/20/2014  . Left shoulder pain with abduction 12/12/2013  . CVA (cerebral infarction) 07/18/2013  . TIA (transient ischemic attack) 07/18/2013  . Contracture of axilla 05/20/2013  . Primary cancer of upper inner quadrant of left female breast ([place]) 01/24/2013  . Anal fissure 05/03/2011  . Hemorrhoids, internal, with prolapse 05/03/2011   Donato Heinz. Owens Shark PT  [name] 01/19/2017, 11:51 AM  [place], [name], [address] Phone: [phone]   Fax:  [phone]  Name: Kristi [name] MRN: [id number] Date of Birth: 1956/09/22

## 2017-01-20 ENCOUNTER — Telehealth: Payer: Self-pay | Admitting: Hematology and Oncology

## 2017-01-20 NOTE — Telephone Encounter (Signed)
Spoke with patient re f/u 3/19

## 2017-01-22 ENCOUNTER — Ambulatory Visit (HOSPITAL_BASED_OUTPATIENT_CLINIC_OR_DEPARTMENT_OTHER): Payer: BC Managed Care – PPO

## 2017-01-22 ENCOUNTER — Ambulatory Visit (HOSPITAL_BASED_OUTPATIENT_CLINIC_OR_DEPARTMENT_OTHER): Payer: BC Managed Care – PPO | Admitting: Hematology and Oncology

## 2017-01-22 ENCOUNTER — Encounter: Payer: Self-pay | Admitting: Hematology and Oncology

## 2017-01-22 DIAGNOSIS — C50212 Malignant neoplasm of upper-inner quadrant of left female breast: Secondary | ICD-10-CM | POA: Diagnosis not present

## 2017-01-22 DIAGNOSIS — Z170 Estrogen receptor positive status [ER+]: Secondary | ICD-10-CM

## 2017-01-22 DIAGNOSIS — Z79811 Long term (current) use of aromatase inhibitors: Secondary | ICD-10-CM

## 2017-01-22 LAB — COMPREHENSIVE METABOLIC PANEL
ALT: 22 U/L (ref 0–55)
ANION GAP: 11 meq/L (ref 3–11)
AST: 22 U/L (ref 5–34)
Albumin: 3.9 g/dL (ref 3.5–5.0)
Alkaline Phosphatase: 81 U/L (ref 40–150)
BILIRUBIN TOTAL: 0.36 mg/dL (ref 0.20–1.20)
BUN: 11.5 mg/dL (ref 7.0–26.0)
CALCIUM: 10.2 mg/dL (ref 8.4–10.4)
CO2: 29 meq/L (ref 22–29)
Chloride: 104 mEq/L (ref 98–109)
Creatinine: 1 mg/dL (ref 0.6–1.1)
EGFR: 67 mL/min/{1.73_m2} — AB (ref >90)
Glucose: 103 mg/dl (ref 70–140)
Potassium: 3.4 mEq/L — ABNORMAL LOW (ref 3.5–5.1)
Sodium: 143 mEq/L (ref 136–145)
TOTAL PROTEIN: 7.5 g/dL (ref 6.4–8.3)

## 2017-01-22 LAB — CBC WITH DIFFERENTIAL/PLATELET
BASO%: 0 % (ref 0.0–2.0)
Basophils Absolute: 0 10*3/uL (ref 0.0–0.1)
EOS%: 1.1 % (ref 0.0–7.0)
Eosinophils Absolute: 0.1 10*3/uL (ref 0.0–0.5)
HCT: 33.7 % — ABNORMAL LOW (ref 34.8–46.6)
HGB: 11.2 g/dL — ABNORMAL LOW (ref 11.6–15.9)
LYMPH%: 40.7 % (ref 14.0–49.7)
MCH: 28.4 pg (ref 25.1–34.0)
MCHC: 33.2 g/dL (ref 31.5–36.0)
MCV: 85.5 fL (ref 79.5–101.0)
MONO#: 0.3 10*3/uL (ref 0.1–0.9)
MONO%: 6.7 % (ref 0.0–14.0)
NEUT#: 2.4 10*3/uL (ref 1.5–6.5)
NEUT%: 51.5 % (ref 38.4–76.8)
PLATELETS: 142 10*3/uL — AB (ref 145–400)
RBC: 3.94 10*6/uL (ref 3.70–5.45)
RDW: 15 % — ABNORMAL HIGH (ref 11.2–14.5)
WBC: 4.6 10*3/uL (ref 3.9–10.3)
lymph#: 1.9 10*3/uL (ref 0.9–3.3)

## 2017-01-22 NOTE — Assessment & Plan Note (Addendum)
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 followed by Taxol carboplatin later switched to [place] completed 08/21/2013, status post adjuvant radiation, currently on tamoxifen started 11/13/2013 Switched to anastrozole 03/10/2016   Left breast lymphedema: received physical therapy Neuropathy related to prior chemotherapy: Patient is currently on Neurontin and her symptoms are not improving. This is her biggest complaint today. I discussed with her that we can try to put her on pain medications for relief of her symptoms. I'm not certain that the neuropathy will get any more significantly better.  Major depression: Patient is seeing psychiatry. She stopped tamoxifen for 3 months. We switched her to anastrozole and this appears to be better tolerated. Her depression symptoms have markedly improved.   Anastrozole toxicities: 1. Musculoskeletal aches and pains especially in the knees: Patient is Trying to do more activity to help alleviate the symptoms. 2. Denies any major problems with depression. Denies any hot flashes.   Breast Cancer Surveillance: 1. Breast exam 01/22/2017: Normal 2. Mammogram 02/01/2016 No abnormalities. Postsurgical changes. Breast Density Category B. I recommended that she get 3-D mammograms for surveillance. Discussed the differences between different breast density categories.  Return to clinic in 1 year for follow-up.

## 2017-01-22 NOTE — Progress Notes (Signed)
Patient Care Team: Mosie Lukes, MD as PCP - General (Family Medicine) Nicholas Lose, MD as Consulting Physician (Hematology and Oncology) Johnn Hai, MD as Referring Physician (Psychiatry) Lin Landsman, MD as Consulting Physician (Family Medicine) Inocencio Homes, DPM as Consulting Physician (Podiatry) Gery Pray, MD as Consulting Physician (Radiation Oncology)  DIAGNOSIS:  Encounter Diagnosis  Name Primary?  . Primary cancer of upper inner quadrant of left female breast ([place])     SUMMARY OF ONCOLOGIC HISTORY:   Primary cancer of upper inner quadrant of left female breast ([place])   01/24/2013 Initial Diagnosis    Ultrasound: Left breast no position 1 x 0.7 cm, 12:00 position 1.6 x 0.9 cm in addition 2.1 cm axillary lymph node: Biopsies of breast and axilla were positive for IDC high-grade;  ER weakly positive, PR negative, HER-2 negative      02/04/2013 Surgery    Left breast lumpectomy: 1.6 cm tumor 1/19 axillary lymph nodes positive, ER 6%, PR 0%, HER-2 negative, Ki-67 90%      03/05/2013 - 08/21/2013 Chemotherapy    Adjuvant chemotherapy with Adriamycin and Cytoxan followed by Taxol and [place] x9 followed by [place] x2      09/09/2013 - 10/28/2013 Radiation Therapy    Adjuvant radiation therapy      11/13/2013 -  Anti-estrogen oral therapy    Tamoxifen 20 mg daily switched to anastrozole May 2017       CHIEF COMPLIANT: Follow-up on anastrozole therapy  INTERVAL HISTORY: Kristi [name] is a 61 year old with above-mentioned history of left breast cancer treated with lumpectomy and adjuvant chemotherapy and is complete her radiation. She is currently on antiestrogen therapy since January 2015. Initially she was on tamoxifen and it was later switched to anastrozole in May 2017. She is here today basically complaining of severe neuropathy that it does not appear to be improving. This has been disabling. She is follows with neurology as well.  REVIEW OF SYSTEMS:     Constitutional: Denies fevers, chills or abnormal weight loss Eyes: Denies blurriness of vision Ears, nose, mouth, throat, and face: Denies mucositis or sore throat Respiratory: Denies cough, dyspnea or wheezes Cardiovascular: Denies palpitation, chest discomfort Gastrointestinal:  Denies nausea, heartburn or change in bowel habits Skin: Denies abnormal skin rashes Lymphatics: Denies new lymphadenopathy or easy bruising Neurological: Neuropathy and extremities Behavioral/Psych: Mood is stable, no new changes  Extremities: No lower extremity edema All other systems were reviewed with the patient and are negative.  I have reviewed the past medical history, past surgical history, social history and family history with the patient and they are unchanged from previous note.  ALLERGIES:  has No Known Allergies.  MEDICATIONS:  Current Outpatient Prescriptions  Medication Sig Dispense Refill  . amitriptyline (ELAVIL) 100 MG tablet Take 100 mg by mouth at bedtime.  2  . amitriptyline (ELAVIL) 25 MG tablet Take 4 tablets (100 mg total) by mouth at bedtime. (Patient not taking: Reported on 01/03/2017) 30 tablet 1  . anastrozole (ARIMIDEX) 1 MG tablet TAKE 1 TABLET BY MOUTH EVERY DAY 90 tablet 3  . Ascorbic Acid (VITAMIN C) 1000 MG tablet Take 1,000 mg by mouth daily.    Marland Kitchen aspirin EC 81 MG tablet Take 162 mg by mouth daily.    . B Complex-C (SUPER B COMPLEX PO) Take 1 tablet by mouth daily.    . clonazePAM (KLONOPIN) 1 MG tablet Take 1 tablet (1 mg total) by mouth 3 (three) times daily. 90 tablet 0  . diclofenac (  VOLTAREN) 75 MG EC tablet TAKE 1 TABLET (75 MG TOTAL) BY MOUTH 2 (TWO) TIMES DAILY. 30 tablet 0  . Eszopiclone (ESZOPICLONE) 3 MG TABS Take 3 mg by mouth at bedtime. Take immediately before bedtime    . gabapentin (NEURONTIN) 800 MG tablet TAKE 1 TABLET BY MOUTH 3 TIMES A DAY 90 tablet 3  . KLOR-CON M20 20 MEQ tablet TAKE 2 TABLETS BY MOUTH EVERY DAY FOR 3 DAYS, THEN TAKE 1 TABLET EVERY  DAY 35 tablet 1  . KLOR-CON M20 20 MEQ tablet TAKE 2 TABLETS BY MOUTH EVERY DAY FOR 3 DAYS, THEN TAKE 1 TABLET EVERY DAY 35 tablet 1  . Lurasidone HCl (LATUDA) 60 MG TABS Take 60 mg by mouth daily.    . Multiple Vitamin (MULTIVITAMIN WITH MINERALS) TABS tablet Take 1 tablet by mouth daily.    . valsartan-hydrochlorothiazide (DIOVAN-HCT) 320-25 MG tablet TAKE 1 TABLET BY MOUTH EVERY MORNING 30 tablet 2   No current facility-administered medications for this visit.     PHYSICAL EXAMINATION: ECOG PERFORMANCE STATUS: 2 - Symptomatic, <50% confined to bed  Vitals:   01/22/17 0959  BP: 123/71  Pulse: 93  Resp: 17  Temp: 97.7 F (36.5 C)   Filed Weights   01/22/17 0959  Weight: 184 lb 14.4 oz (83.9 kg)    GENERAL:alert, no distress and comfortable SKIN: skin color, texture, turgor are normal, no rashes or significant lesions EYES: normal, Conjunctiva are pink and non-injected, sclera clear OROPHARYNX:no exudate, no erythema and lips, buccal mucosa, and tongue normal  NECK: supple, thyroid normal size, non-tender, without nodularity LYMPH:  no palpable lymphadenopathy in the cervical, axillary or inguinal LUNGS: clear to auscultation and percussion with normal breathing effort HEART: regular rate & rhythm and no murmurs and no lower extremity edema ABDOMEN:abdomen soft, non-tender and normal bowel sounds MUSCULOSKELETAL:no cyanosis of digits and no clubbing  NEURO: alert & oriented x 3 with fluent speech, neuropathy and extremities grade 3 EXTREMITIES: No lower extremity edema  LABORATORY DATA:  I have reviewed the data as listed   Chemistry      Component Value Date/Time   NA 142 11/13/2016 1635   NA 141 09/01/2014 1525   K 4.5 11/13/2016 1635   K 3.6 09/01/2014 1525   CL 103 11/13/2016 1635   CL 100 04/23/2013 1447   CO2 32 11/13/2016 1635   CO2 28 09/01/2014 1525   BUN 17 11/13/2016 1635   BUN 13.3 09/01/2014 1525   CREATININE 1.04 11/13/2016 1635   CREATININE 0.8  09/01/2014 1525      Component Value Date/Time   CALCIUM 10.5 11/13/2016 1635   CALCIUM 9.4 09/01/2014 1525   ALKPHOS 71 11/13/2016 1635   ALKPHOS 62 09/01/2014 1525   AST 21 11/13/2016 1635   AST 19 09/01/2014 1525   ALT 17 11/13/2016 1635   ALT 17 09/01/2014 1525   BILITOT 0.3 11/13/2016 1635   BILITOT 0.22 09/01/2014 1525       Lab Results  Component Value Date   WBC 5.6 11/13/2016   HGB 11.4 (L) 11/13/2016   HCT 34.3 (L) 11/13/2016   MCV 87.4 11/13/2016   PLT 200.0 11/13/2016   NEUTROABS 2.0 09/01/2014    ASSESSMENT & PLAN:  Primary cancer of upper inner quadrant of left female breast Multifocal invasive ductal carcinoma grade 3 ER weakly positive at 3% PR negative HER-2/neu negative with Ki-67 of 90% status post lumpectomy 02/04/2013 1.6 and the tumor one out of 19 lymph nodes positive status  post adjuvant a.c. followed by Taxol carboplatin later switched to [place] completed 08/21/2013, status post adjuvant radiation, currently on tamoxifen started 11/13/2013 Switched to anastrozole 03/10/2016   Left breast lymphedema: received physical therapy Neuropathy related to prior chemotherapy: Patient is currently on Neurontin and her symptoms are not improving. This is her biggest complaint today. I discussed with her that we can try to put her on pain medications for relief of her symptoms. I'm not certain that the neuropathy will get any more significantly better. I recommended the following measures 1. Physical therapy consult for occupational therapy to help her with driving issues 2. TENS 3. Essential oils and turmeric  Major depression: Patient is seeing psychiatry. Since she stop tamoxifen, Her depression symptoms had markedly improved.   Anastrozole toxicities: 1. Musculoskeletal aches and pains especially in the knees: Patient is Trying to do more activity to help alleviate the symptoms. 2. Denies any major problems with depression. Denies any hot flashes.     Breast Cancer Surveillance: 1. Breast exam 01/22/2017: Normal 2. Mammogram 02/01/2016 No abnormalities. Postsurgical changes. Breast Density Category B. I recommended that she get 3-D mammograms for surveillance. Discussed the differences between different breast density categories.  Return to clinic in 1 year for follow-up.  I spent 25 minutes talking to the patient of which more than half was spent in counseling and coordination of care.  Orders Placed This Encounter  Procedures  . CBC with Differential    Standing Status:   Future    Number of Occurrences:   1    Standing Expiration Date:   01/22/2018  . Comprehensive metabolic panel    Standing Status:   Future    Number of Occurrences:   1    Standing Expiration Date:   01/22/2018  . Ambulatory referral to Physical Therapy    Referral Priority:   Routine    Referral Type:   Physical Medicine    Referral Reason:   Specialty Services Required    Requested Specialty:   Physical Therapy    Number of Visits Requested:   1   The patient has a good understanding of the overall plan. she agrees with it. she will call with any problems that may develop before the next visit here.   Rulon Eisenmenger, MD 01/22/17

## 2017-01-23 ENCOUNTER — Ambulatory Visit: Payer: BC Managed Care – PPO | Admitting: Physical Therapy

## 2017-01-23 DIAGNOSIS — M25612 Stiffness of left shoulder, not elsewhere classified: Secondary | ICD-10-CM | POA: Diagnosis not present

## 2017-01-23 DIAGNOSIS — G8929 Other chronic pain: Secondary | ICD-10-CM

## 2017-01-23 DIAGNOSIS — R293 Abnormal posture: Secondary | ICD-10-CM

## 2017-01-23 DIAGNOSIS — M25512 Pain in left shoulder: Secondary | ICD-10-CM

## 2017-01-23 DIAGNOSIS — I890 Lymphedema, not elsewhere classified: Secondary | ICD-10-CM

## 2017-01-23 NOTE — Therapy (Signed)
[place], [name], [address] Phone: [phone]   Fax:  [phone]  Physical Therapy Treatment  Patient Details  Name: Kristi [name] MRN: [id number] Date of Birth: 23-Dec-1955 Referring Provider: Barry Dienes  Encounter Date: 01/23/2017      PT End of Session - 01/23/17 1714    Visit Number 6   Number of Visits 12   Date for PT Re-Evaluation 01/31/17   PT Start Time 2778   PT Stop Time 1345   PT Time Calculation (min) 40 min   Activity Tolerance Patient tolerated treatment well   Behavior During Therapy [facility] for tasks assessed/performed      Past Medical History:  Diagnosis Date  . Anemia    Iron deficinecy anemia  . Anxiety   . Anxiety and depression 05/15/2014  . Arthritis   . Breast cancer ([place])    left ,last radiation 2'15, last chemo 8'14  . Constipation 11/13/2016  . Depression   . History of chicken pox   . History of radiation therapy 09/09/13-10/28/13   45 gray to left breast, lumpectomy cavity boosted to 63 gray  . HTN (hypertension) 11/13/2016  . Hyperglycemia 01/09/2016  . Hypertension   . MRSA (methicillin resistant Staphylococcus aureus) 2009   right groin area-no issues now. 04-07-14 PCR screen negative today.  . Neuropathy ([place])   . Preventative health care 11/13/2016    Past Surgical History:  Procedure Laterality Date  . ANAL SPHINCTEROTOMY  04/2011  . APPENDECTOMY  1980  . AXILLARY LYMPH NODE DISSECTION Left 02/04/2013   Procedure: LEFT AXILLARY LYMPH NODE DISSECTION;  Surgeon: Stark Klein, MD;  Location: [place];  Service: General;  Laterality: Left;  End: 2423  . BREAST LUMPECTOMY WITH NEEDLE LOCALIZATION Left 02/04/2013   Procedure: LEFT BREAST LUMPECTOMY WITH NEEDLE LOCALIZATION;  Surgeon: Stark Klein, MD;  Location: [place];  Service: General;  Laterality: Left;  . BREAST SURGERY     Lumpectomy in april 2014  . HEMORRHOID SURGERY  04/2011   ligation  . PORT-A-CATH REMOVAL N/A  04/16/2014   Procedure: REMOVAL PORT-A-CATH;  Surgeon: Stark Klein, MD;  [facility];  Service: General;  Laterality: N/A;  . PORTACATH PLACEMENT Right 02/04/2013   Procedure: INSERTION PORT-A-CATH;  Surgeon: Stark Klein, MD;  Location: [place];  Service: General;  Laterality: Right;  Start Time: 5361.  Marland Kitchen SHOULDER ARTHROSCOPY WITH ROTATOR CUFF REPAIR AND SUBACROMIAL DECOMPRESSION Left 02/24/2014   Procedure: SHOULDER ARTHROSCOPY WITH ROTATOR CUFF REPAIR AND SUBACROMIAL DECOMPRESSION;  Surgeon: Meredith Pel, MD;  Location: [place];  Service: Orthopedics;  Laterality: Left;  LEFT SHOULDER DIAGNOSTIC OPERATIVE ARTHROSCOPY, SUBACROMIAL DECOMPRESSION, ROTATOR CUFF TEAR REPAIR    There were no vitals filed for this visit.      Subjective Assessment - 01/23/17 1310    Subjective pt states she has noticed more itching than ususal around the nipple area of her left breast    Pertinent History Left lumpectomy and axillary lymph node dissection 02/04/13 followed by chemotherapy and radiation to left breast and axilla. She has struggled significantly with bilateral feet neuropathy, anxiety and depression. History includes of rotator cuff surgery on left arm    Patient Stated Goals for my breast to feel better and more comfortable to where I can tolerate it   Currently in Pain? No/denies                         [facility] Adult PT Treatment/Exercise -  01/23/17 0001      Lumbar Exercises: Supine   Other Supine Lumbar Exercises on soft foam roller : mini marches for core activation      Shoulder Exercises: Supine   Other Supine Exercises over foam roller for shoulder horizontal abduction stretch and flexion strech      Shoulder Exercises: Sidelying   ABduction AROM;Left;5 reps   Other Sidelying Exercises small circles pointing to the ceiling in each direction    Other Sidelying Exercises upper trunk rotation wtih left shoulder horizontal abduction      Shoulder Exercises: Standing    Flexion AAROM;Both;5 reps   Flexion Limitations standing flexion with dowel rod, cues to keep core engaged    ABduction AAROM;Both;5 reps   ABduction Limitations cues to keep core engaged    Other Standing Exercises modified downwward dog     Manual Therapy   Manual Lymphatic Drainage (MLD) short neck, superficial and deep abdominals, stimulation of left inguinal and right axillary nodes, established interaxillary and left axillo inguinal anastomoses, drainage of left breast and left upper quadrant moving fluid towards pathways, then in right sidelying worked on left axillo inguinal anastomosis and drainage of lateral breast, also establishment of posterior inter axillary pathway, back to supine and finshed with inter-axillary and axillo-inguinal anastomosis   Passive ROM Into abduction briefly during manual lymph drainage with axillary release   Kinesiotex Edema     Kinesiotix   Edema skinkote applied to left breast, abdomen in fan shape toward inguinal nodes.                         Long Term Clinic Goals - 01/19/17 1006      CC Long Term Goal  #1   Title Pt to have an appropriate compression garment for management of left breast lymphedema   Baseline pt not interested in wearing more compressive garments    Status Deferred     CC Long Term Goal  #2   Title Pt to report a 30% improvement in left breast swelling for improved comfort    Status On-going     CC Long Term Goal  #3   Title Pt will have an improved LLIS impairment score of less than 40% to allow for increased comfort   Baseline 50% impairment   Status On-going            Plan - 01/23/17 1715    Clinical Impression Statement Pt appears to be doing better with shoulder exercises and feels like she is improving in shoulder function.  She continues to receive benefit from kinesiotape and seems to have softening of breast tissue    Rehab Potential Good   Clinical Impairments Affecting Rehab Potential  previous radiation,CIPN and breast lymphedema with previous treatment   PT Frequency 2x / week   PT Duration 4 weeks   PT Treatment/Interventions Manual techniques;Manual lymph drainage;ADLs/Self Care Home Management;Patient/family education;Taping   PT Next Visit Plan MLD for left breast, assess Continue with  kinesiotape.  Try myofascial techniuques to left axillary area with stretches  Continue with pulleys, ball up the wall and modified downard dog stretches and active shoulder standing range of motion with core engaged       Patient will benefit from skilled therapeutic intervention in order to improve the following deficits and impairments:  Pain, Increased edema  Visit Diagnosis: Abnormal posture  Stiffness of left shoulder, not elsewhere classified  Lymphedema, not elsewhere classified  Chronic left shoulder  pain     Problem List Patient Active Problem List   Diagnosis Date Noted  . Nodule of finger of left hand 11/14/2016  . Right hip pain 11/14/2016  . Chronic pain of both knees 11/14/2016  . Constipation 11/13/2016  . Preventative health care 11/13/2016  . HTN (hypertension) 11/13/2016  . Generalized anxiety disorder 03/14/2016    Class: Chronic  . Hyperglycemia 01/09/2016  . Chemotherapy-induced peripheral neuropathy ([place]) 09/21/2015  . History of chicken pox   . Shingles 05/15/2014  . Anxiety and depression 05/15/2014  . Rotator cuff tear 02/24/2014  . Hot flashes due to tamoxifen 02/20/2014  . Left shoulder pain with abduction 12/12/2013  . CVA (cerebral infarction) 07/18/2013  . TIA (transient ischemic attack) 07/18/2013  . Contracture of axilla 05/20/2013  . Primary cancer of upper inner quadrant of left female breast ([place]) 01/24/2013  . Anal fissure 05/03/2011  . Hemorrhoids, internal, with prolapse 05/03/2011   Donato Heinz. Owens Shark PT  [name] 01/23/2017, 5:17 PM  [place], [name], [address] Phone: [phone]   Fax:  [phone]  Name: JHANIYA BRISKI MRN: [id number] Date of Birth: 05-Apr-1956

## 2017-01-24 ENCOUNTER — Other Ambulatory Visit: Payer: Self-pay | Admitting: General Surgery

## 2017-01-24 ENCOUNTER — Ambulatory Visit: Payer: BC Managed Care – PPO

## 2017-01-24 DIAGNOSIS — M25612 Stiffness of left shoulder, not elsewhere classified: Secondary | ICD-10-CM

## 2017-01-24 DIAGNOSIS — G8929 Other chronic pain: Secondary | ICD-10-CM | POA: Diagnosis not present

## 2017-01-24 DIAGNOSIS — R293 Abnormal posture: Secondary | ICD-10-CM

## 2017-01-24 DIAGNOSIS — I890 Lymphedema, not elsewhere classified: Secondary | ICD-10-CM | POA: Diagnosis not present

## 2017-01-24 DIAGNOSIS — M25512 Pain in left shoulder: Secondary | ICD-10-CM

## 2017-01-24 NOTE — Therapy (Signed)
[place], [name], [address] Phone: [phone]   Fax:  [phone]  Physical Therapy Treatment  Patient Details  Name: Kristi [name] MRN: [id number] Date of Birth: 01/03/1956 Referring Provider: Barry Dienes  Encounter Date: 01/24/2017      PT End of Session - 01/24/17 1016    Visit Number 7   Number of Visits 12   Date for PT Re-Evaluation 01/31/17   PT Start Time 0930   PT Stop Time 1015   PT Time Calculation (min) 45 min   Activity Tolerance Patient tolerated treatment well   Behavior During Therapy [facility] for tasks assessed/performed      Past Medical History:  Diagnosis Date  . Anemia    Iron deficinecy anemia  . Anxiety   . Anxiety and depression 05/15/2014  . Arthritis   . Breast cancer ([place])    left ,last radiation 2'15, last chemo 8'14  . Constipation 11/13/2016  . Depression   . History of chicken pox   . History of radiation therapy 09/09/13-10/28/13   45 gray to left breast, lumpectomy cavity boosted to 63 gray  . HTN (hypertension) 11/13/2016  . Hyperglycemia 01/09/2016  . Hypertension   . MRSA (methicillin resistant Staphylococcus aureus) 2009   right groin area-no issues now. 04-07-14 PCR screen negative today.  . Neuropathy ([place])   . Preventative health care 11/13/2016    Past Surgical History:  Procedure Laterality Date  . ANAL SPHINCTEROTOMY  04/2011  . APPENDECTOMY  1980  . AXILLARY LYMPH NODE DISSECTION Left 02/04/2013   Procedure: LEFT AXILLARY LYMPH NODE DISSECTION;  Surgeon: Stark Klein, MD;  Location: [place];  Service: General;  Laterality: Left;  End: 2376  . BREAST LUMPECTOMY WITH NEEDLE LOCALIZATION Left 02/04/2013   Procedure: LEFT BREAST LUMPECTOMY WITH NEEDLE LOCALIZATION;  Surgeon: Stark Klein, MD;  Location: [place];  Service: General;  Laterality: Left;  . BREAST SURGERY     Lumpectomy in april 2014  . HEMORRHOID SURGERY  04/2011   ligation  . PORT-A-CATH REMOVAL N/A  04/16/2014   Procedure: REMOVAL PORT-A-CATH;  Surgeon: Stark Klein, MD;  [facility];  Service: General;  Laterality: N/A;  . PORTACATH PLACEMENT Right 02/04/2013   Procedure: INSERTION PORT-A-CATH;  Surgeon: Stark Klein, MD;  Location: [place];  Service: General;  Laterality: Right;  Start Time: 2831.  Marland Kitchen SHOULDER ARTHROSCOPY WITH ROTATOR CUFF REPAIR AND SUBACROMIAL DECOMPRESSION Left 02/24/2014   Procedure: SHOULDER ARTHROSCOPY WITH ROTATOR CUFF REPAIR AND SUBACROMIAL DECOMPRESSION;  Surgeon: Meredith Pel, MD;  Location: [place];  Service: Orthopedics;  Laterality: Left;  LEFT SHOULDER DIAGNOSTIC OPERATIVE ARTHROSCOPY, SUBACROMIAL DECOMPRESSION, ROTATOR CUFF TEAR REPAIR    There were no vitals filed for this visit.      Subjective Assessment - 01/24/17 0933    Subjective I have the tape on from yesterday. I really feel like that's been helping alot.    Pertinent History Left lumpectomy and axillary lymph node dissection 02/04/13 followed by chemotherapy and radiation to left breast and axilla. She has struggled significantly with bilateral feet neuropathy, anxiety and depression. History includes of rotator cuff surgery on left arm    Patient Stated Goals for my breast to feel better and more comfortable to where I can tolerate it   Currently in Pain? No/denies                         [facility] Adult PT Treatment/Exercise - 01/24/17  0001      Shoulder Exercises: Pulleys   Flexion 2 minutes   Flexion Limitations Tactile and VC to decrease scapular compensations   ABduction 2 minutes   ABduction Limitations VC throughout to decrease scapular compensations.      Shoulder Exercises: Therapy Ball   Flexion 10 reps  With forward lean into end of stretch     Manual Therapy   Manual Lymphatic Drainage (MLD) short neck, superficial and deep abdominals, stimulation of left inguinal and right axillary nodes, established interaxillary and left axillo inguinal anastomoses,  drainage of left breast and left upper quadrant moving fluid towards pathways, then in right sidelying worked on left axillo inguinal anastomosis and drainage of lateral breast, also establishment of posterior inter axillary pathway, back to supine and finshed with inter-axillary and axillo-inguinal anastomosis   Passive ROM Into abduction briefly during manual lymph drainage with axillary release   Kinesiotex Edema     Kinesiotix   Edema Removed tape from yesterday and reapplied; skinkote applied to left breast, abdomen in fan shape toward inguinal nodes.                         Long Term Clinic Goals - 01/24/17 0940      CC Long Term Goal  #1   Title Pt to have an appropriate compression garment for management of left breast lymphedema   Baseline pt not interested in wearing more compressive garments    Status Deferred     CC Long Term Goal  #2   Title Pt to report a 30% improvement in left breast swelling for improved comfort    Baseline Pt reports 50% improvement at this time-01/24/17   Status Achieved     CC Long Term Goal  #3   Title Pt will have an improved LLIS impairment score of less than 40% to allow for increased comfort   Status On-going            Plan - 01/24/17 1018    Clinical Impression Statement Pt continues with benefit from kinesiotape and manual lymph drainage to Lt breast. Her Lt breast tissue is as soft as this therapist has ever felt it (have worked with this pt in [employer] over the years with [place] for her lymphedema). Pt also reports feeling much improvement overall rating her discomfort having improved by 50%, meeting that goal. She is pleased with her progress thus far and will benefit from continued therpay to help manage her symptoms.    Rehab Potential Good   Clinical Impairments Affecting Rehab Potential previous radiation,CIPN and breast lymphedema with previous treatment   PT Frequency 2x / week   PT Duration 4 weeks   PT  Treatment/Interventions Manual techniques;Manual lymph drainage;ADLs/Self Care Home Management;Patient/family education;Taping   PT Next Visit Plan MLD for left breast, assess Continue with  kinesiotape.  Cont myofascial techniuques to left axillary area with stretches  Continue with pulleys, ball up the wall and modified downard dog stretches and active shoulder standing range of motion with core engaged    Consulted and Agree with Plan of Care Patient      Patient will benefit from skilled therapeutic intervention in order to improve the following deficits and impairments:  Pain, Increased edema  Visit Diagnosis: Abnormal posture  Stiffness of left shoulder, not elsewhere classified  Lymphedema, not elsewhere classified  Chronic left shoulder pain     Problem List Patient Active Problem List   Diagnosis  Date Noted  . Nodule of finger of left hand 11/14/2016  . Right hip pain 11/14/2016  . Chronic pain of both knees 11/14/2016  . Constipation 11/13/2016  . Preventative health care 11/13/2016  . HTN (hypertension) 11/13/2016  . Generalized anxiety disorder 03/14/2016    Class: Chronic  . Hyperglycemia 01/09/2016  . Chemotherapy-induced peripheral neuropathy ([place]) 09/21/2015  . History of chicken pox   . Shingles 05/15/2014  . Anxiety and depression 05/15/2014  . Rotator cuff tear 02/24/2014  . Hot flashes due to tamoxifen 02/20/2014  . Left shoulder pain with abduction 12/12/2013  . CVA (cerebral infarction) 07/18/2013  . TIA (transient ischemic attack) 07/18/2013  . Contracture of axilla 05/20/2013  . Primary cancer of upper inner quadrant of left female breast ([place]) 01/24/2013  . Anal fissure 05/03/2011  . Hemorrhoids, internal, with prolapse 05/03/2011    Otelia Limes, PTA 01/24/2017, 10:21 AM  [place], [name], [address] Phone: [phone]   Fax:  [phone]  Name:  Kristi [name] MRN: [id number] Date of Birth: [birth year]

## 2017-01-26 ENCOUNTER — Encounter: Payer: Self-pay | Admitting: Physical Therapy

## 2017-01-30 ENCOUNTER — Ambulatory Visit: Payer: BC Managed Care – PPO | Admitting: Physical Therapy

## 2017-01-30 DIAGNOSIS — G8929 Other chronic pain: Secondary | ICD-10-CM | POA: Diagnosis not present

## 2017-01-30 DIAGNOSIS — I890 Lymphedema, not elsewhere classified: Secondary | ICD-10-CM | POA: Diagnosis not present

## 2017-01-30 DIAGNOSIS — R293 Abnormal posture: Secondary | ICD-10-CM | POA: Diagnosis not present

## 2017-01-30 DIAGNOSIS — M25612 Stiffness of left shoulder, not elsewhere classified: Secondary | ICD-10-CM

## 2017-01-30 DIAGNOSIS — M25512 Pain in left shoulder: Secondary | ICD-10-CM | POA: Diagnosis not present

## 2017-01-30 NOTE — Patient Instructions (Signed)

## 2017-01-30 NOTE — Therapy (Signed)
[place], [name], [address] Phone: [phone]   Fax:  [phone]  Physical Therapy Treatment  Patient Details  Name: Kristi [name] MRN: [id number] Date of Birth: February 05, 1956 Referring Provider: Dr. Barry Dienes   Encounter Date: 01/30/2017      PT End of Session - 01/30/17 1748    Visit Number 8   Number of Visits 18   Date for PT Re-Evaluation 03/13/17   PT Start Time 1324   PT Stop Time 1420   PT Time Calculation (min) 35 min   Activity Tolerance Patient tolerated treatment well   Behavior During Therapy [facility] for tasks assessed/performed      Past Medical History:  Diagnosis Date  . Anemia    Iron deficinecy anemia  . Anxiety   . Anxiety and depression 05/15/2014  . Arthritis   . Breast cancer ([place])    left ,last radiation 2'15, last chemo 8'14  . Constipation 11/13/2016  . Depression   . History of chicken pox   . History of radiation therapy 09/09/13-10/28/13   45 gray to left breast, lumpectomy cavity boosted to 63 gray  . HTN (hypertension) 11/13/2016  . Hyperglycemia 01/09/2016  . Hypertension   . MRSA (methicillin resistant Staphylococcus aureus) 2009   right groin area-no issues now. 04-07-14 PCR screen negative today.  . Neuropathy ([place])   . Preventative health care 11/13/2016    Past Surgical History:  Procedure Laterality Date  . ANAL SPHINCTEROTOMY  04/2011  . APPENDECTOMY  1980  . AXILLARY LYMPH NODE DISSECTION Left 02/04/2013   Procedure: LEFT AXILLARY LYMPH NODE DISSECTION;  Surgeon: Stark Klein, MD;  Location: [place];  Service: General;  Laterality: Left;  End: 4010  . BREAST LUMPECTOMY WITH NEEDLE LOCALIZATION Left 02/04/2013   Procedure: LEFT BREAST LUMPECTOMY WITH NEEDLE LOCALIZATION;  Surgeon: Stark Klein, MD;  Location: [place];  Service: General;  Laterality: Left;  . BREAST SURGERY     Lumpectomy in april 2014  . HEMORRHOID SURGERY  04/2011   ligation  . PORT-A-CATH REMOVAL  N/A 04/16/2014   Procedure: REMOVAL PORT-A-CATH;  Surgeon: Stark Klein, MD;  Location: [facility];  Service: General;  Laterality: N/A;  . PORTACATH PLACEMENT Right 02/04/2013   Procedure: INSERTION PORT-A-CATH;  Surgeon: Stark Klein, MD;  Location: [place];  Service: General;  Laterality: Right;  Start Time: 2725.  Marland Kitchen SHOULDER ARTHROSCOPY WITH ROTATOR CUFF REPAIR AND SUBACROMIAL DECOMPRESSION Left 02/24/2014   Procedure: SHOULDER ARTHROSCOPY WITH ROTATOR CUFF REPAIR AND SUBACROMIAL DECOMPRESSION;  Surgeon: Meredith Pel, MD;  Location: [place];  Service: Orthopedics;  Laterality: Left;  LEFT SHOULDER DIAGNOSTIC OPERATIVE ARTHROSCOPY, SUBACROMIAL DECOMPRESSION, ROTATOR CUFF TEAR REPAIR    There were no vitals filed for this visit.      Subjective Assessment - 01/30/17 1358    Subjective pt states that she just does better when she comes here to get the manual lymph drainage and kinesiotape. She would like to be renewed for 6 more weeks and decrease her frequency to one time a week to see how she does to manage it on her own at home    Pertinent History Left lumpectomy and axillary lymph node dissection 02/04/13 followed by chemotherapy and radiation to left breast and axilla. She has struggled significantly with bilateral feet neuropathy, anxiety and depression. History includes of rotator cuff surgery on left arm    Patient Stated Goals for my breast to feel better and more comfortable to where I can  tolerate it   Currently in Pain? Yes   Pain Score 2    Pain Location Breast   Pain Orientation Left   Pain Descriptors / Indicators Tender   Pain Type Chronic pain   Pain Onset In the past 7 days   Pain Frequency Intermittent  comes and goes   Aggravating Factors  sweeping or trying to use the vaccum cleaner ( pt is left handed)    Pain Relieving Factors kinesiotape and manual lymph draiange    Effect of Pain on Daily Activities limited household chores             [facility] PT Assessment -  01/30/17 0001      Assessment   Medical Diagnosis left breast cancer   Referring Provider Dr. Barry Dienes    Onset Date/Surgical Date 02/05/13     Prior Function   Level of Independence Independent                     [facility] Adult PT Treatment/Exercise - 01/30/17 0001      Lumbar Exercises: Supine   Straight Leg Raise 5 reps   Straight Leg Raises Limitations 4 part SLR     Shoulder Exercises: Supine   Horizontal ABduction Strengthening;Both;5 reps;Theraband   Theraband Level (Shoulder Horizontal ABduction) Level 1 (Yellow)   External Rotation Strengthening;Both;5 reps;Theraband   Flexion Strengthening;Both;5 reps;Theraband  wide and narrow grip    Theraband Level (Shoulder Flexion) Level 1 (Yellow)   Other Supine Exercises diagonal elevation with each arm with yellow theraband      Shoulder Exercises: Pulleys   Flexion 2 minutes   ABduction 2 minutes     Shoulder Exercises: ROM/Strengthening   [place] into flexion/ abduction with right hand on abdomen for self manua lymph draiange      Manual Therapy   Manual Lymphatic Drainage (MLD) short neck, superficial and deep abdominals, stimulation of left inguinal and right axillary nodes, established interaxillary and left axillo inguinal anastomoses, drainage of left breast and left upper quadrant moving fluid towards pathways, then in right sidelying worked on left axillo inguinal anastomosis and drainage of lateral breast, also establishment of posterior inter axillary pathway, back to supine and finshed with inter-axillary and axillo-inguinal anastomosis   Passive ROM Into abduction briefly during manual lymph drainage with axillary release   Kinesiotex Edema     Kinesiotix   Edema skinkote applied to left breast, abdomen in fan shape toward inguinal nodes.                 PT Education - 01/30/17 1747    Education provided Yes   Education Details supiine scapular series   Person(s) Educated Patient    Methods Explanation;Demonstration;Handout   Comprehension Verbalized understanding;Returned demonstration                Long Term Clinic Goals - 01/30/17 1351      CC Long Term Goal  #1   Title Pt to have an appropriate compression garment for management of left breast lymphedema   Baseline pt not interested in wearing more compressive garments    Time 4   Status Deferred     CC Long Term Goal  #2   Title Pt to report a 75% improvement in left breast swelling for improved comfort    Baseline Pt reports 50% improvement at this time-01/24/17   Time 6   Period Weeks   Status Revised     CC Long Term Goal  #  3   Title Pt will have an improved LLIS impairment score of less than 40% to allow for increased comfort   Baseline 50% impairment   Time 6   Period Weeks   Status On-going     CC Long Term Goal  #4   Title Pt will report she able to to manage her breast swelling at home wiht use of self manual lymph draiange, exercise and kinesiotape    Baseline pt states she continues to have problems with self manual lymph drainage    Time 6            Plan - 01/30/17 1748    Clinical Impression Statement Pt is happy with her progression but feels that she is not quite ready to stop PT.  She would like to continue coming one time a week to try to get more relief of pain and  make sure she can continue to manage her symptoms on her own and possibly  learn how to apply kinesiotape on her own.    Rehab Potential Good   Clinical Impairments Affecting Rehab Potential previous radiation,CIPN and breast lymphedema with previous treatment   PT Frequency 1x / week   PT Duration 6 weeks   PT Treatment/Interventions Manual techniques;Manual lymph drainage;ADLs/Self Care Home Management;Patient/family education;Taping   PT Next Visit Plan MLD for left breast, assess Continue with  kinesiotape. and teach pt how to apply for herself.  Cont myofascial techniuques to left axillary area with  stretches  Continue with pulleys, ball up the wall and modified downard dog stretches and active shoulder standing range of motion with core engaged    Consulted and Agree with Plan of Care Patient      Patient will benefit from skilled therapeutic intervention in order to improve the following deficits and impairments:  Pain, Increased edema  Visit Diagnosis: Abnormal posture - Plan: PT plan of care cert/re-cert  Stiffness of left shoulder, not elsewhere classified - Plan: PT plan of care cert/re-cert  Lymphedema, not elsewhere classified - Plan: PT plan of care cert/re-cert  Chronic left shoulder pain - Plan: PT plan of care cert/re-cert     Problem List Patient Active Problem List   Diagnosis Date Noted  . Nodule of finger of left hand 11/14/2016  . Right hip pain 11/14/2016  . Chronic pain of both knees 11/14/2016  . Constipation 11/13/2016  . Preventative health care 11/13/2016  . HTN (hypertension) 11/13/2016  . Generalized anxiety disorder 03/14/2016    Class: Chronic  . Hyperglycemia 01/09/2016  . Chemotherapy-induced peripheral neuropathy ([place]) 09/21/2015  . History of chicken pox   . Shingles 05/15/2014  . Anxiety and depression 05/15/2014  . Rotator cuff tear 02/24/2014  . Hot flashes due to tamoxifen 02/20/2014  . Left shoulder pain with abduction 12/12/2013  . CVA (cerebral infarction) 07/18/2013  . TIA (transient ischemic attack) 07/18/2013  . Contracture of axilla 05/20/2013  . Primary cancer of upper inner quadrant of left female breast ([place]) 01/24/2013  . Anal fissure 05/03/2011  . Hemorrhoids, internal, with prolapse 05/03/2011   Donato Heinz. Owens Shark PT  [name] 01/30/2017, 5:53 PM  [place], [name], [address] Phone: [phone]   Fax:  [phone]  Name: Kristi [name] MRN: [id number] Date of Birth: 02-03-1956

## 2017-01-31 ENCOUNTER — Ambulatory Visit: Payer: BC Managed Care – PPO

## 2017-01-31 DIAGNOSIS — I890 Lymphedema, not elsewhere classified: Secondary | ICD-10-CM | POA: Diagnosis not present

## 2017-01-31 DIAGNOSIS — G8929 Other chronic pain: Secondary | ICD-10-CM

## 2017-01-31 DIAGNOSIS — M25512 Pain in left shoulder: Secondary | ICD-10-CM

## 2017-01-31 DIAGNOSIS — M25612 Stiffness of left shoulder, not elsewhere classified: Secondary | ICD-10-CM

## 2017-01-31 DIAGNOSIS — R293 Abnormal posture: Secondary | ICD-10-CM

## 2017-01-31 NOTE — Therapy (Signed)
[place], [name], [address] Phone: [phone]   Fax:  [phone]  Physical Therapy Treatment  Patient Details  Name: Kristi [name] MRN: [id number] Date of Birth: [birth year] Referring Provider: Dr. Barry Dienes   Encounter Date: 01/31/2017      PT End of Session - 01/31/17 0948    Visit Number 9   Number of Visits 18   Date for PT Re-Evaluation 03/13/17   PT Start Time 0938   PT Stop Time 1024   PT Time Calculation (min) 46 min   Activity Tolerance Patient tolerated treatment well   Behavior During Therapy [facility] for tasks assessed/performed      Past Medical History:  Diagnosis Date  . Anemia    Iron deficinecy anemia  . Anxiety   . Anxiety and depression 05/15/2014  . Arthritis   . Breast cancer ([place])    left ,last radiation 2'15, last chemo 8'14  . Constipation 11/13/2016  . Depression   . History of chicken pox   . History of radiation therapy 09/09/13-10/28/13   45 gray to left breast, lumpectomy cavity boosted to 63 gray  . HTN (hypertension) 11/13/2016  . Hyperglycemia 01/09/2016  . Hypertension   . MRSA (methicillin resistant Staphylococcus aureus) 2009   right groin area-no issues now. 04-07-14 PCR screen negative today.  . Neuropathy ([place])   . Preventative health care 11/13/2016    Past Surgical History:  Procedure Laterality Date  . ANAL SPHINCTEROTOMY  04/2011  . APPENDECTOMY  1980  . AXILLARY LYMPH NODE DISSECTION Left 02/04/2013   Procedure: LEFT AXILLARY LYMPH NODE DISSECTION;  Surgeon: Stark Klein, MD;  Location: [place];  Service: General;  Laterality: Left;  End: 7782  . BREAST LUMPECTOMY WITH NEEDLE LOCALIZATION Left 02/04/2013   Procedure: LEFT BREAST LUMPECTOMY WITH NEEDLE LOCALIZATION;  Surgeon: Stark Klein, MD;  Location: [place];  Service: General;  Laterality: Left;  . BREAST SURGERY     Lumpectomy in april 2014  . HEMORRHOID SURGERY  04/2011   ligation  . PORT-A-CATH REMOVAL  N/A 04/16/2014   Procedure: REMOVAL PORT-A-CATH;  Surgeon: Stark Klein, MD;  Location[facility];  Service: General;  Laterality: N/A;  . PORTACATH PLACEMENT Right 02/04/2013   Procedure: INSERTION PORT-A-CATH;  Surgeon: Stark Klein, MD;  Location: [place];  Service: General;  Laterality: Right;  Start Time: 4235.  Marland Kitchen SHOULDER ARTHROSCOPY WITH ROTATOR CUFF REPAIR AND SUBACROMIAL DECOMPRESSION Left 02/24/2014   Procedure: SHOULDER ARTHROSCOPY WITH ROTATOR CUFF REPAIR AND SUBACROMIAL DECOMPRESSION;  Surgeon: Meredith Pel, MD;  Location: [place];  Service: Orthopedics;  Laterality: Left;  LEFT SHOULDER DIAGNOSTIC OPERATIVE ARTHROSCOPY, SUBACROMIAL DECOMPRESSION, ROTATOR CUFF TEAR REPAIR    There were no vitals filed for this visit.      Subjective Assessment - 01/31/17 0942    Subjective I'm still sore from stretching yesterday in my Lt axilla and breast.    Pertinent History Left lumpectomy and axillary lymph node dissection 02/04/13 followed by chemotherapy and radiation to left breast and axilla. She has struggled significantly with bilateral feet neuropathy, anxiety and depression. History includes of rotator cuff surgery on left arm    Patient Stated Goals for my breast to feel better and more comfortable to where I can tolerate it   Currently in Pain? Yes   Pain Score 2    Pain Location Arm   Pain Orientation Left   Pain Descriptors / Indicators Sore   Pain Type Chronic pain  Pain Onset 1 to 4 weeks ago   Pain Frequency Intermittent   Aggravating Factors  think it's just sore from stretching yesterday   Pain Relieving Factors manual lymph drainage help the arm feel better bc the swelling goes down                         [facility] Adult PT Treatment/Exercise - 01/31/17 0001      Shoulder Exercises: Pulleys   Flexion 3 minutes   Flexion Limitations Tactile and VC to decrease scapular compensations   ABduction 2 minutes     Shoulder Exercises: Therapy Ball   Flexion 10  reps  With forward lean into end of stretch     Manual Therapy   Manual Therapy Scapular mobilization   Scapular Mobilization When in Rt S/L during manual lymph drainage performed this to Lt scapula into retraction and protraction to pts tolerance   Manual Lymphatic Drainage (MLD) short neck, superficial and deep abdominals, stimulation of left inguinal and right axillary nodes, established interaxillary and left axillo inguinal anastomoses, drainage of left breast and left upper quadrant moving fluid towards pathways, then in right sidelying worked on left axillo inguinal anastomosis and drainage of lateral breast, also establishment of posterior inter axillary pathway, back to supine and finshed with inter-axillary and axillo-inguinal anastomosis   Passive ROM Into abduction briefly during manual lymph drainage with axillary release   Kinesiotex Edema     Kinesiotix   Edema skinkote applied to left breast, abdomen in fan shape toward inguinal nodes.                 PT Education - 01/31/17 1024    Education provided Yes   Education Details Issued piece of kinesiotape and issued pt in application with demonstration    Person(s) Educated Patient   Methods Explanation;Demonstration   Comprehension Verbalized understanding;Need further instruction                Long Term Clinic Goals - 01/30/17 1351      CC Long Term Goal  #1   Title Pt to have an appropriate compression garment for management of left breast lymphedema   Baseline pt not interested in wearing more compressive garments    Time 4   Status Deferred     CC Long Term Goal  #2   Title Pt to report a 75% improvement in left breast swelling for improved comfort    Baseline Pt reports 50% improvement at this time-01/24/17   Time 6   Period Weeks   Status Revised     CC Long Term Goal  #3   Title Pt will have an improved LLIS impairment score of less than 40% to allow for increased comfort   Baseline 50%  impairment   Time 6   Period Weeks   Status On-going     CC Long Term Goal  #4   Title Pt will report she able to to manage her breast swelling at home wiht use of self manual lymph draiange, exercise and kinesiotape    Baseline pt states she continues to have problems with self manual lymph drainage    Time 6            Plan - 01/31/17 0949    Clinical Impression Statement Pt with residual soreness as she was just here yesterday. Reports feeling the kinesiotape has helped reduce the slight increase of swelling she felt she had attained over  the weekend and the scapular mobilizations helped her to feel looser after session today.    Rehab Potential Good   Clinical Impairments Affecting Rehab Potential previous radiation,CIPN and breast lymphedema with previous treatment   PT Frequency 1x / week   PT Duration 6 weeks   PT Treatment/Interventions Manual techniques;Manual lymph drainage;ADLs/Self Care Home Management;Patient/family education;Taping   PT Next Visit Plan MLD for left breast, assess Continue with  kinesiotape. and review with pt how to apply for herself/try to put on in front of mirror so pt can watch it being applied.  Cont myofascial techniuques to left axillary area with stretches  Continue with pulleys, ball up the wall and modified downard dog stretches and active shoulder standing range of motion with core engaged    Consulted and Agree with Plan of Care Patient      Patient will benefit from skilled therapeutic intervention in order to improve the following deficits and impairments:  Pain, Increased edema  Visit Diagnosis: Abnormal posture  Stiffness of left shoulder, not elsewhere classified  Lymphedema, not elsewhere classified  Chronic left shoulder pain     Problem List Patient Active Problem List   Diagnosis Date Noted  . Nodule of finger of left hand 11/14/2016  . Right hip pain 11/14/2016  . Chronic pain of both knees 11/14/2016  . Constipation  11/13/2016  . Preventative health care 11/13/2016  . HTN (hypertension) 11/13/2016  . Generalized anxiety disorder 03/14/2016    Class: Chronic  . Hyperglycemia 01/09/2016  . Chemotherapy-induced peripheral neuropathy ([place]) 09/21/2015  . History of chicken pox   . Shingles 05/15/2014  . Anxiety and depression 05/15/2014  . Rotator cuff tear 02/24/2014  . Hot flashes due to tamoxifen 02/20/2014  . Left shoulder pain with abduction 12/12/2013  . CVA (cerebral infarction) 07/18/2013  . TIA (transient ischemic attack) 07/18/2013  . Contracture of axilla 05/20/2013  . Primary cancer of upper inner quadrant of left female breast ([place]) 01/24/2013  . Anal fissure 05/03/2011  . Hemorrhoids, internal, with prolapse 05/03/2011    Otelia Limes, PTA 01/31/2017, 10:26 AM  [place], [name], [address] Phone: [phone]   Fax:  [phone]  Name: Kristi [name] MRN: [id number] Date of Birth: 17-May-1956

## 2017-02-01 DIAGNOSIS — R928 Other abnormal and inconclusive findings on diagnostic imaging of breast: Secondary | ICD-10-CM | POA: Diagnosis not present

## 2017-02-01 DIAGNOSIS — Z853 Personal history of malignant neoplasm of breast: Secondary | ICD-10-CM | POA: Diagnosis not present

## 2017-02-01 LAB — HM MAMMOGRAPHY

## 2017-02-06 ENCOUNTER — Encounter: Payer: Self-pay | Admitting: Family Medicine

## 2017-02-06 NOTE — Progress Notes (Signed)
3/29/

## 2017-02-08 ENCOUNTER — Ambulatory Visit: Payer: BC Managed Care – PPO | Attending: General Surgery | Admitting: Physical Therapy

## 2017-02-08 DIAGNOSIS — I890 Lymphedema, not elsewhere classified: Secondary | ICD-10-CM | POA: Insufficient documentation

## 2017-02-08 DIAGNOSIS — R293 Abnormal posture: Secondary | ICD-10-CM | POA: Diagnosis not present

## 2017-02-08 DIAGNOSIS — G8929 Other chronic pain: Secondary | ICD-10-CM | POA: Insufficient documentation

## 2017-02-08 DIAGNOSIS — M25612 Stiffness of left shoulder, not elsewhere classified: Secondary | ICD-10-CM | POA: Diagnosis not present

## 2017-02-08 DIAGNOSIS — M25512 Pain in left shoulder: Secondary | ICD-10-CM | POA: Diagnosis not present

## 2017-02-08 NOTE — Therapy (Signed)
[place], [name], [address] Phone: [phone]   Fax:  [phone]  Physical Therapy Treatment  Patient Details  Name: Kristi [name] MRN: [id number] Date of Birth: 12/02/1955 Referring Provider: Dr. Barry Dienes   Encounter Date: 02/08/2017      PT End of Session - 02/08/17 1730    Visit Number 10   Number of Visits 18   Date for PT Re-Evaluation 03/13/17   PT Start Time 1430   PT Stop Time 1515   PT Time Calculation (min) 45 min   Activity Tolerance Patient tolerated treatment well   Behavior During Therapy [facility] for tasks assessed/performed      Past Medical History:  Diagnosis Date  . Anemia    Iron deficinecy anemia  . Anxiety   . Anxiety and depression 05/15/2014  . Arthritis   . Breast cancer ([place])    left ,last radiation 2'15, last chemo 8'14  . Constipation 11/13/2016  . Depression   . History of chicken pox   . History of radiation therapy 09/09/13-10/28/13   45 gray to left breast, lumpectomy cavity boosted to 63 gray  . HTN (hypertension) 11/13/2016  . Hyperglycemia 01/09/2016  . Hypertension   . MRSA (methicillin resistant Staphylococcus aureus) 2009   right groin area-no issues now. 04-07-14 PCR screen negative today.  . Neuropathy ([place])   . Preventative health care 11/13/2016    Past Surgical History:  Procedure Laterality Date  . ANAL SPHINCTEROTOMY  04/2011  . APPENDECTOMY  1980  . AXILLARY LYMPH NODE DISSECTION Left 02/04/2013   Procedure: LEFT AXILLARY LYMPH NODE DISSECTION;  Surgeon: Stark Klein, MD;  Location: [place];  Service: General;  Laterality: Left;  End: 1594  . BREAST LUMPECTOMY WITH NEEDLE LOCALIZATION Left 02/04/2013   Procedure: LEFT BREAST LUMPECTOMY WITH NEEDLE LOCALIZATION;  Surgeon: Stark Klein, MD;  Location: [place];  Service: General;  Laterality: Left;  . BREAST SURGERY     Lumpectomy in april 2014  . HEMORRHOID SURGERY  04/2011   ligation  . PORT-A-CATH REMOVAL  N/A 04/16/2014   Procedure: REMOVAL PORT-A-CATH;  Surgeon: Stark Klein, MD;  Location: [facility];  Service: General;  Laterality: N/A;  . PORTACATH PLACEMENT Right 02/04/2013   Procedure: INSERTION PORT-A-CATH;  Surgeon: Stark Klein, MD;  Location: [place];  Service: General;  Laterality: Right;  Start Time: 5859.  Marland Kitchen SHOULDER ARTHROSCOPY WITH ROTATOR CUFF REPAIR AND SUBACROMIAL DECOMPRESSION Left 02/24/2014   Procedure: SHOULDER ARTHROSCOPY WITH ROTATOR CUFF REPAIR AND SUBACROMIAL DECOMPRESSION;  Surgeon: Meredith Pel, MD;  Location: [place];  Service: Orthopedics;  Laterality: Left;  LEFT SHOULDER DIAGNOSTIC OPERATIVE ARTHROSCOPY, SUBACROMIAL DECOMPRESSION, ROTATOR CUFF TEAR REPAIR    There were no vitals filed for this visit.      Subjective Assessment - 02/08/17 1512    Subjective "I'm having a mastectomy next week"  Pt states she has not had relief from the breast pain and she feels this is a good option for her    Pertinent History Left lumpectomy and axillary lymph node dissection 02/04/13 followed by chemotherapy and radiation to left breast and axilla. She has struggled significantly with bilateral feet neuropathy, anxiety and depression. History includes of rotator cuff surgery on left arm    Patient Stated Goals for my breast to feel better and more comfortable to where I can tolerate it   Currently in Pain? Yes   Pain Score 1    Pain Location Breast   Pain Descriptors /  Indicators Sore   Pain Type Chronic pain   Pain Onset 1 to 4 weeks ago   Pain Frequency Intermittent                         [facility] Adult PT Treatment/Exercise - 2017/02/13 0001      Exercises   Exercises --  reviewed post op exercises avoiding >90 elevation with drain     Manual Therapy   Manual Therapy Scapular mobilization   Scapular Mobilization When in Rt S/L during manual lymph drainage performed this to Lt scapula into retraction and protraction to pts tolerance   Manual Lymphatic  Drainage (MLD) short neck, superficial and deep abdominals, stimulation of left inguinal and right axillary nodes, established interaxillary and left axillo inguinal anastomoses, drainage of left breast and left upper quadrant moving fluid towards pathways, then in right sidelying worked on left axillo inguinal anastomosis and drainage of lateral breast, also establishment of posterior inter axillary pathway, back to supine and finshed with inter-axillary and axillo-inguinal anastomosis   Passive ROM Into abduction briefly during manual lymph drainage with axillary release   Kinesiotex Edema     Kinesiotix   Edema skinkote applied to left breast, abdomen in fan shape toward inguinal nodes.                         Long Term Clinic Goals - 02-13-2017 1732      CC Long Term Goal  #1   Title Pt to have an appropriate compression garment for management of left breast lymphedema   Baseline pt not interested in wearing more compressive garments      CC Long Term Goal  #2   Title Pt to report a 75% improvement in left breast swelling for improved comfort    Baseline Pt reports 50% improvement at this time-01/24/17   Status Partially Met     CC Long Term Goal  #3   Title Pt will have an improved LLIS impairment score of less than 40% to allow for increased comfort   Baseline 50% impairment   Status Deferred     CC Long Term Goal  #4   Status Deferred            Plan - February 13, 2017 1730    Clinical Impression Statement Pt has not received relief from breast soreness despite several episodes of physical therapy.  She is having surgery for breast removal next week so this episode will be discharged    PT Next Visit Plan discharge    Consulted and Agree with Plan of Care Patient      Patient will benefit from skilled therapeutic intervention in order to improve the following deficits and impairments:     Visit Diagnosis: Abnormal posture  Stiffness of left shoulder, not  elsewhere classified  Lymphedema, not elsewhere classified  Chronic left shoulder pain       G-Codes - 02/13/2017 1732    Functional Assessment Tool Used (Outpatient Only) clincial judgement    Functional Limitation Other PT primary   Other PT Primary Goal Status (H7026) At least 20 percent but less than 40 percent impaired, limited or restricted   Other PT Primary Discharge Status (V7858) At least 40 percent but less than 60 percent impaired, limited or restricted      Problem List Patient Active Problem List   Diagnosis Date Noted  . Nodule of finger of left hand 11/14/2016  .  Right hip pain 11/14/2016  . Chronic pain of both knees 11/14/2016  . Constipation 11/13/2016  . Preventative health care 11/13/2016  . HTN (hypertension) 11/13/2016  . Generalized anxiety disorder 03/14/2016    Class: Chronic  . Hyperglycemia 01/09/2016  . Chemotherapy-induced peripheral neuropathy ([place]) 09/21/2015  . History of chicken pox   . Shingles 05/15/2014  . Anxiety and depression 05/15/2014  . Rotator cuff tear 02/24/2014  . Hot flashes due to tamoxifen 02/20/2014  . Left shoulder pain with abduction 12/12/2013  . CVA (cerebral infarction) 07/18/2013  . TIA (transient ischemic attack) 07/18/2013  . Contracture of axilla 05/20/2013  . Primary cancer of upper inner quadrant of left female breast ([place]) 01/24/2013  . Anal fissure 05/03/2011  . Hemorrhoids, internal, with prolapse 05/03/2011   PHYSICAL THERAPY DISCHARGE SUMMARY  Visits from Start of Care: 10  Current functional level related to goals / functional outcomes: Pt continues with pain    Remaining deficits: Pain and swelling in breast    Education / Equipment: Use of kinesiotape for lymphedema management  Plan: Patient agrees to discharge.  Patient goals were not met. Patient is being discharged due to lack of progress.  ?????    Donato Heinz. Owens Shark PT  [name] 02/08/2017, 5:34 PM  [place], [name], [address] Phone: [phone]   Fax:  [phone]  Name: TAYLORANNE LEKAS MRN: [id number] Date of Birth: Feb 17, 1956

## 2017-02-09 NOTE — Progress Notes (Signed)
LOV Dr Charlett Blake 11-13-16 epic CMP, CBCdiff 01-22-17 on epic

## 2017-02-09 NOTE — Patient Instructions (Signed)
Kristi [name]  02/09/2017   Your procedure is scheduled on: 02-15-17  Report to [facility] Main  Entrance Take [place]  elevators to 3rd floor to  [place] at 530AM.   Call this number if you have problems the morning of surgery [phone]    Remember: ONLY 1 PERSON MAY GO WITH YOU TO SHORT STAY TO GET  READY MORNING OF YOUR SURGERY.  Do not eat food or drink liquids :After Midnight.     Take these medicines the morning of surgery with A SIP OF WATER: klonopin, gabapentin(neurontin), hydrocodone as needed                                You may not have any metal on your body including hair pins and              piercings  Do not wear jewelry, make-up, lotions, powders or perfumes, deodorant             Do not wear nail polish.  Do not shave  48 hours prior to surgery.     Do not bring valuables to the hospital. [place].  Contacts, dentures or bridgework may not be worn into surgery.  Leave suitcase in the car. After surgery it may be brought to your room.              Please read over the following fact sheets you were given: _____________________________________________________________________             [facility] - Preparing for Surgery Before surgery, you can play an important role.  Because skin is not sterile, your skin needs to be as free of germs as possible.  You can reduce the number of germs on your skin by washing with CHG (chlorahexidine gluconate) soap before surgery.  CHG is an antiseptic cleaner which kills germs and bonds with the skin to continue killing germs even after washing. Please DO NOT use if you have an allergy to CHG or antibacterial soaps.  If your skin becomes reddened/irritated stop using the CHG and inform your nurse when you arrive at Short Stay. Do not shave (including legs and underarms) for at least 48 hours prior to the first CHG shower.  You may shave your  face/neck. Please follow these instructions carefully:  1.  Shower with CHG Soap the night before surgery and the  morning of Surgery.  2.  If you choose to wash your hair, wash your hair first as usual with your  normal  shampoo.  3.  After you shampoo, rinse your hair and body thoroughly to remove the  shampoo.                           4.  Use CHG as you would any other liquid soap.  You can apply chg directly  to the skin and wash                       Gently with a scrungie or clean washcloth.  5.  Apply the CHG Soap to your body ONLY FROM THE NECK DOWN.   Do not use on face/ open  Wound or open sores. Avoid contact with eyes, ears mouth and genitals (private parts).                       Wash face,  Genitals (private parts) with your normal soap.             6.  Wash thoroughly, paying special attention to the area where your surgery  will be performed.  7.  Thoroughly rinse your body with warm water from the neck down.  8.  DO NOT shower/wash with your normal soap after using and rinsing off  the CHG Soap.                9.  Pat yourself dry with a clean towel.            10.  Wear clean pajamas.            11.  Place clean sheets on your bed the night of your first shower and do not  sleep with pets. Day of Surgery : Do not apply any lotions/deodorants the morning of surgery.  Please wear clean clothes to the hospital/surgery center.  FAILURE TO FOLLOW THESE INSTRUCTIONS MAY RESULT IN THE CANCELLATION OF YOUR SURGERY PATIENT SIGNATURE_________________________________  NURSE SIGNATURE__________________________________  ________________________________________________________________________

## 2017-02-13 ENCOUNTER — Inpatient Hospital Stay (HOSPITAL_COMMUNITY)
Admission: RE | Admit: 2017-02-13 | Discharge: 2017-02-13 | Disposition: A | Discharge status: Home / Self Care | Payer: Self-pay | Source: Ambulatory Visit

## 2017-02-13 NOTE — Pre-Procedure Instructions (Signed)
    Kristi [name]  02/13/2017      [id number] - [address], [address] [name] [address] Phone: [phone] Fax: [phone]    Your procedure is scheduled on Thurs. April 12  Report to [facility] Admitting at 5:30 A.M.  Call this number if you have problems the morning of surgery:  [phone]   Remember:  Do not eat food or drink liquids after midnight on Wed. April 11   Take these medicines the morning of surgery with A SIP OF WATER : arimidex, clonazepam (klonopin),gabapentin (neurontin), hydrocodone if needed,              1 week prior to surgery stop aspirin, diclofenac (voltaren), vitamins, herbal medications,NSAIDS: advil, motrin, ibuprofen, aleve, BC Powders, goody's                 3:30 A.M. The day of surgery :  Drink Boost    Do not wear jewelry, make-up or nail polish.  Do not wear lotions, powders, or perfumes, or deoderant.  Do not shave 48 hours prior to surgery.  Men may shave face and neck.  Do not bring valuables to the hospital.  [facility] is not responsible for any belongings or valuables.  Contacts, dentures or bridgework may not be worn into surgery.  Leave your suitcase in the car.  After surgery it may be brought to your room.  For patients admitted to the hospital, discharge time will be determined by your treatment team.  Patients discharged the day of surgery will not be allowed to drive home.   Name and phone number of your driver:   Special instructions:  Review preparing for surgery  Please read over the following fact sheets that you were given. Coughing and Deep Breathing and Surgical Site Infection Prevention

## 2017-02-14 ENCOUNTER — Encounter (HOSPITAL_COMMUNITY)
Admission: RE | Admit: 2017-02-14 | Discharge: 2017-02-14 | Disposition: A | Discharge status: Home / Self Care | Payer: BC Managed Care – PPO | Source: Ambulatory Visit | Attending: General Surgery | Admitting: General Surgery

## 2017-02-14 DIAGNOSIS — C50212 Malignant neoplasm of upper-inner quadrant of left female breast: Secondary | ICD-10-CM | POA: Insufficient documentation

## 2017-02-14 DIAGNOSIS — Z7982 Long term (current) use of aspirin: Secondary | ICD-10-CM | POA: Diagnosis not present

## 2017-02-14 DIAGNOSIS — Z923 Personal history of irradiation: Secondary | ICD-10-CM

## 2017-02-14 DIAGNOSIS — I890 Lymphedema, not elsewhere classified: Secondary | ICD-10-CM

## 2017-02-14 DIAGNOSIS — Z853 Personal history of malignant neoplasm of breast: Secondary | ICD-10-CM | POA: Diagnosis not present

## 2017-02-14 DIAGNOSIS — I739 Peripheral vascular disease, unspecified: Secondary | ICD-10-CM | POA: Diagnosis not present

## 2017-02-14 DIAGNOSIS — Z9889 Other specified postprocedural states: Secondary | ICD-10-CM | POA: Insufficient documentation

## 2017-02-14 DIAGNOSIS — D649 Anemia, unspecified: Secondary | ICD-10-CM | POA: Diagnosis not present

## 2017-02-14 DIAGNOSIS — I10 Essential (primary) hypertension: Secondary | ICD-10-CM | POA: Diagnosis not present

## 2017-02-14 DIAGNOSIS — Z01810 Encounter for preprocedural cardiovascular examination: Secondary | ICD-10-CM

## 2017-02-14 DIAGNOSIS — Z9221 Personal history of antineoplastic chemotherapy: Secondary | ICD-10-CM | POA: Diagnosis not present

## 2017-02-14 DIAGNOSIS — M1990 Unspecified osteoarthritis, unspecified site: Secondary | ICD-10-CM | POA: Diagnosis not present

## 2017-02-14 DIAGNOSIS — Z79899 Other long term (current) drug therapy: Secondary | ICD-10-CM

## 2017-02-14 DIAGNOSIS — Y838 Other surgical procedures as the cause of abnormal reaction of the patient, or of later complication, without mention of misadventure at the time of the procedure: Secondary | ICD-10-CM | POA: Diagnosis not present

## 2017-02-14 DIAGNOSIS — Z01812 Encounter for preprocedural laboratory examination: Secondary | ICD-10-CM

## 2017-02-14 DIAGNOSIS — I972 Postmastectomy lymphedema syndrome: Secondary | ICD-10-CM | POA: Diagnosis not present

## 2017-02-14 LAB — CBC
HCT: 35.7 % — ABNORMAL LOW (ref 36.0–46.0)
HEMOGLOBIN: 11.6 g/dL — AB (ref 12.0–15.0)
MCH: 27.8 pg (ref 26.0–34.0)
MCHC: 32.5 g/dL (ref 30.0–36.0)
MCV: 85.6 fL (ref 78.0–100.0)
Platelets: 153 10*3/uL (ref 150–400)
RBC: 4.17 MIL/uL (ref 3.87–5.11)
RDW: 14.8 % (ref 11.5–15.5)
WBC: 5 10*3/uL (ref 4.0–10.5)

## 2017-02-14 LAB — COMPREHENSIVE METABOLIC PANEL
ALK PHOS: 68 U/L (ref 38–126)
ALT: 26 U/L (ref 14–54)
ANION GAP: 10 (ref 5–15)
AST: 38 U/L (ref 15–41)
Albumin: 4.1 g/dL (ref 3.5–5.0)
BILIRUBIN TOTAL: 0.5 mg/dL (ref 0.3–1.2)
BUN: 10 mg/dL (ref 6–20)
CALCIUM: 9.8 mg/dL (ref 8.9–10.3)
CO2: 27 mmol/L (ref 22–32)
CREATININE: 1.13 mg/dL — AB (ref 0.44–1.00)
Chloride: 102 mmol/L (ref 101–111)
GFR, EST AFRICAN AMERICAN: 59 mL/min — AB (ref >60)
GFR, EST NON AFRICAN AMERICAN: 51 mL/min — AB (ref >60)
GLUCOSE: 93 mg/dL (ref 65–99)
Potassium: 3.3 mmol/L — ABNORMAL LOW (ref 3.5–5.1)
Sodium: 139 mmol/L (ref 135–145)
Total Protein: 7.3 g/dL (ref 6.5–8.1)

## 2017-02-14 NOTE — H&P (Addendum)
Kristi [name] 01/17/2017 3:24 PM Location: [facility] Patient #: [id number] DOB: 04-23-1956 Married / Language: English / Race: Black or African American Female   History of Present Illness Stark Klein MD; 01/23/2017 11:49 PM) The patient is a 61 year old female who presents for a follow-up for Breast cancer. Pt is a 61 yo F who was diagnosed with left breast cancer in march 2014. She underwent surgery for pT1cN1M0 left breast cancer. (L lumpectomy and ALND). This was weakly ER positive, but was PR negative and her2 negative. She had adjuvant chemotherapy and radiation.  I saw her several weeks ago and we went back to physical therapy. She says that the real problem is that she will make progress and then will run out of visits before her breast lymphedema is significantly improved. She has tried the flexitouch and and still had issues with it.   The main issue is still soreness and heaviness of her left breast. No matter if she makes a bit of improvement, she goes downhill again every time. She has significant limitation of her daily activities. She has not developed new health problems.    Mammogram is up to date 01/2016.    Allergies Patsey Berthold, [place]; 01/17/2017 3:24 PM) No Known Drug Allergies 08/16/2015  Medication History Patsey Berthold, CMA; 01/17/2017 3:26 PM) Amitriptyline HCl (100MG Tablet, Oral) Active. ClonazePAM (1MG Tablet, Oral) Active. Klor-Con M20 [facility] Tablet ER, Oral) Active. Gabapentin (800MG Tablet, Oral) Active. Valsartan-Hydrochlorothiazide (320-25MG Tablet, Oral) Active. Anastrozole (1MG Tablet, Oral) Active. Latuda (60MG Tablet, Oral) Active. Multivitamin (Oral) Active. Vitamin C (1000MG Tablet, Oral) Active. Aspirin (81MG Tablet, Oral) Active. Super B Complex/Vitamin C (Oral) Active. Medications Reconciled    Review of Systems Stark Klein MD; 01/23/2017 11:49 PM) All other systems negative  Vitals Patsey Berthold  CMA; 01/17/2017 3:27 PM) 01/17/2017 3:26 PM Weight: 185.6 lb Height: 62in Body Surface Area: 1.85 m Body Mass Index: 33.95 kg/m  Temp.: 97.44F  Pulse: 90 (Regular)  BP: 120/70 (Sitting, Left Arm, Standard)       Physical Exam Stark Klein MD; 01/23/2017 11:50 PM) General Mental Status-Alert. General Appearance-Consistent with stated age. Hydration-Well hydrated. Voice-Normal.  Head and Neck Head-normocephalic, atraumatic with no lesions or palpable masses.  Eye Sclera/Conjunctiva - Bilateral-No scleral icterus.  Chest and Lung Exam Chest and lung exam reveals -quiet, even and easy respiratory effort with no use of accessory muscles. Inspection Chest Wall - Normal. Back - normal.  Breast Note: Pt has had mild improvement in left breast lymphedema since her visit several weeks ago. She continues to have striaie, redness, skin changes, and tenderness. Left breast is without masses.   Cardiovascular Cardiovascular examination reveals -normal pedal pulses bilaterally. Note: regular rate and rhythm  Abdomen Inspection-Inspection Normal. Palpation/Percussion Palpation and Percussion of the abdomen reveal - Soft, Non Tender, No Rebound tenderness, No Rigidity (guarding) and No hepatosplenomegaly.  Peripheral Vascular Upper Extremity Inspection - Bilateral - Normal - No Clubbing, No Cyanosis, No Edema, Pulses Intact. Lower Extremity Palpation - Edema - Bilateral - No edema.  Neurologic Neurologic evaluation reveals -alert and oriented x 3 with no impairment of recent or remote memory. Mental Status-Normal.  Musculoskeletal Global Assessment -Note: no gross deformities.  Normal Exam - Left-Upper Extremity Strength Normal and Lower Extremity Strength Normal. Normal Exam - Right-Upper Extremity Strength Normal and Lower Extremity Strength Normal.  Lymphatic Head & Neck  General Head & Neck Lymphatics: Bilateral - Description  - Normal. Axillary  General Axillary Region: Bilateral - Description -  Normal. Tenderness - Non Tender.    Assessment & Plan Stark Klein MD; 01/23/2017 11:54 PM) LYMPHEDEMA OF BREAST (I89.0) Impression: Given the patient's longstanding difficulty with limitation from breast lymphedema, she has requested mastectomy. I think this is reasonable. She has reached the limit of PT allowed and makes a small amount of headway, but continues to be distressed.  I reviewed mastectomy and recovery. I discussed the incision and the fact that many women have "dog ear" under their arms and some dissatisfaction with the scar. I discussed the psychological impact of missing the breast and nipple. I also reviewed that patients can still have pain and swelling after mastectomy and still may require PT.  The patient had multiple family members present in support. She has thought about this quite a bit and would like to proceed.  I discussed bleeding, infection, drain, risk of heart or lung problelms. I discussed wound breakdown and possible need for wound care. we went over how to take care of the drain and restrictions post op.  She understands and wishes to proceed. CARCINOMA OF UPPER-INNER QUADRANT OF LEFT FEMALE BREAST (C50.212) Impression: Continue arimidex. Current Plans Pt Education - [facility] Mastectomy [facility]CI   Signed by Stark Klein, MD (01/23/2017 11:55 PM)

## 2017-02-14 NOTE — Progress Notes (Signed)
Spoke with Shirlean Mylar, RN.  Pt consent order for right mastectomy and pt reports left.  Shirlean Mylar reports she will page Dr Barry Dienes for orders.

## 2017-02-15 ENCOUNTER — Ambulatory Visit (HOSPITAL_COMMUNITY): Payer: BC Managed Care – PPO | Admitting: Certified Registered"

## 2017-02-15 ENCOUNTER — Ambulatory Visit: Payer: BC Managed Care – PPO | Admitting: Physical Therapy

## 2017-02-15 ENCOUNTER — Encounter (HOSPITAL_COMMUNITY): Payer: Self-pay | Admitting: *Deleted

## 2017-02-15 ENCOUNTER — Encounter (HOSPITAL_COMMUNITY)
Admission: RE | Disposition: A | Discharge status: Home / Self Care | Payer: Self-pay | Source: Ambulatory Visit | Attending: General Surgery

## 2017-02-15 ENCOUNTER — Ambulatory Visit (HOSPITAL_COMMUNITY)
Admission: RE | Admit: 2017-02-15 | Discharge: 2017-02-16 | Disposition: A | Discharge status: Home / Self Care | Payer: BC Managed Care – PPO | Source: Ambulatory Visit | Attending: General Surgery | Admitting: General Surgery

## 2017-02-15 DIAGNOSIS — Z7982 Long term (current) use of aspirin: Secondary | ICD-10-CM | POA: Insufficient documentation

## 2017-02-15 DIAGNOSIS — C50912 Malignant neoplasm of unspecified site of left female breast: Secondary | ICD-10-CM | POA: Diagnosis not present

## 2017-02-15 DIAGNOSIS — I10 Essential (primary) hypertension: Secondary | ICD-10-CM | POA: Insufficient documentation

## 2017-02-15 DIAGNOSIS — I972 Postmastectomy lymphedema syndrome: Secondary | ICD-10-CM | POA: Diagnosis not present

## 2017-02-15 DIAGNOSIS — I739 Peripheral vascular disease, unspecified: Secondary | ICD-10-CM | POA: Insufficient documentation

## 2017-02-15 DIAGNOSIS — Z853 Personal history of malignant neoplasm of breast: Secondary | ICD-10-CM

## 2017-02-15 DIAGNOSIS — D649 Anemia, unspecified: Secondary | ICD-10-CM | POA: Insufficient documentation

## 2017-02-15 DIAGNOSIS — Z9221 Personal history of antineoplastic chemotherapy: Secondary | ICD-10-CM | POA: Insufficient documentation

## 2017-02-15 DIAGNOSIS — Z923 Personal history of irradiation: Secondary | ICD-10-CM | POA: Insufficient documentation

## 2017-02-15 DIAGNOSIS — Z79899 Other long term (current) drug therapy: Secondary | ICD-10-CM | POA: Insufficient documentation

## 2017-02-15 DIAGNOSIS — Y838 Other surgical procedures as the cause of abnormal reaction of the patient, or of later complication, without mention of misadventure at the time of the procedure: Secondary | ICD-10-CM | POA: Insufficient documentation

## 2017-02-15 DIAGNOSIS — M1990 Unspecified osteoarthritis, unspecified site: Secondary | ICD-10-CM | POA: Insufficient documentation

## 2017-02-15 HISTORY — PX: MASTECTOMY: SHX3

## 2017-02-15 HISTORY — PX: SIMPLE MASTECTOMY WITH AXILLARY SENTINEL NODE BIOPSY: SHX6098

## 2017-02-15 HISTORY — DX: Personal history of malignant neoplasm of breast: Z85.3

## 2017-02-15 SURGERY — SIMPLE MASTECTOMY
Anesthesia: General | Site: Breast | Laterality: Left

## 2017-02-15 MED ORDER — CLONAZEPAM 1 MG PO TABS
1.0000 mg | ORAL_TABLET | Freq: Two times a day (BID) | ORAL | Status: DC
  Administered 2017-02-15 – 2017-02-16 (×2): 1 mg via ORAL
  Filled 2017-02-15 (×3): qty 1

## 2017-02-15 MED ORDER — ONDANSETRON HCL 4 MG/2ML IJ SOLN: 4.0000 mg | Freq: Four times a day (QID) | INTRAMUSCULAR | Status: DC | PRN

## 2017-02-15 MED ORDER — SIMETHICONE 80 MG PO CHEW: 40.0000 mg | CHEWABLE_TABLET | Freq: Four times a day (QID) | ORAL | Status: DC | PRN

## 2017-02-15 MED ORDER — ONDANSETRON HCL 4 MG/2ML IJ SOLN
INTRAMUSCULAR | Status: DC | PRN
  Administered 2017-02-15: 4 mg via INTRAVENOUS

## 2017-02-15 MED ORDER — SENNA 8.6 MG PO TABS
1.0000 | ORAL_TABLET | Freq: Two times a day (BID) | ORAL | Status: DC
  Administered 2017-02-15 – 2017-02-16 (×2): 8.6 mg via ORAL
  Filled 2017-02-15 (×2): qty 1

## 2017-02-15 MED ORDER — PROPOFOL 10 MG/ML IV BOLUS
INTRAVENOUS | Status: AC
  Filled 2017-02-15: qty 20

## 2017-02-15 MED ORDER — SODIUM CHLORIDE 0.9 % IJ SOLN
INTRAMUSCULAR | Status: DC | PRN
  Administered 2017-02-15: 60 mL

## 2017-02-15 MED ORDER — LIDOCAINE 2% (20 MG/ML) 5 ML SYRINGE
INTRAMUSCULAR | Status: DC | PRN
  Administered 2017-02-15: 100 mg via INTRAVENOUS

## 2017-02-15 MED ORDER — CHLORHEXIDINE GLUCONATE CLOTH 2 % EX PADS: 6.0000 | MEDICATED_PAD | Freq: Once | CUTANEOUS | Status: DC

## 2017-02-15 MED ORDER — HYDROCODONE-ACETAMINOPHEN 5-325 MG PO TABS
1.0000 | ORAL_TABLET | ORAL | Status: DC | PRN
  Administered 2017-02-16: 1 via ORAL
  Filled 2017-02-15: qty 1

## 2017-02-15 MED ORDER — ONDANSETRON 4 MG PO TBDP: 4.0000 mg | ORAL_TABLET | Freq: Four times a day (QID) | ORAL | Status: DC | PRN

## 2017-02-15 MED ORDER — PHENYLEPHRINE 40 MCG/ML (10ML) SYRINGE FOR IV PUSH (FOR BLOOD PRESSURE SUPPORT)
PREFILLED_SYRINGE | INTRAVENOUS | Status: DC | PRN
Start: 2017-02-15 — End: 2017-02-15
  Administered 2017-02-15: 40 ug via INTRAVENOUS
  Administered 2017-02-15: 80 ug via INTRAVENOUS

## 2017-02-15 MED ORDER — DIPHENHYDRAMINE HCL 12.5 MG/5ML PO ELIX: 12.5000 mg | ORAL_SOLUTION | Freq: Four times a day (QID) | ORAL | Status: DC | PRN

## 2017-02-15 MED ORDER — MIDAZOLAM HCL 5 MG/5ML IJ SOLN
INTRAMUSCULAR | Status: DC | PRN
  Administered 2017-02-15: 2 mg via INTRAVENOUS

## 2017-02-15 MED ORDER — AMITRIPTYLINE HCL 50 MG PO TABS
100.0000 mg | ORAL_TABLET | Freq: Every day | ORAL | Status: DC
  Administered 2017-02-15: 100 mg via ORAL
  Filled 2017-02-15: qty 2

## 2017-02-15 MED ORDER — POLYETHYLENE GLYCOL 3350 17 G PO PACK
17.0000 g | PACK | Freq: Every day | ORAL | Status: DC | PRN
Start: 2017-02-15 — End: 2017-02-16

## 2017-02-15 MED ORDER — GABAPENTIN 800 MG PO TABS
800.0000 mg | ORAL_TABLET | Freq: Three times a day (TID) | ORAL | Status: DC
  Filled 2017-02-15: qty 1

## 2017-02-15 MED ORDER — 0.9 % SODIUM CHLORIDE (POUR BTL) OPTIME
TOPICAL | Status: DC | PRN
  Administered 2017-02-15: 1000 mL

## 2017-02-15 MED ORDER — VALSARTAN-HYDROCHLOROTHIAZIDE 320-25 MG PO TABS: 1.0000 | ORAL_TABLET | Freq: Every day | ORAL | Status: DC

## 2017-02-15 MED ORDER — MIDAZOLAM HCL 2 MG/2ML IJ SOLN
INTRAMUSCULAR | Status: AC
  Filled 2017-02-15: qty 2

## 2017-02-15 MED ORDER — IRBESARTAN 300 MG PO TABS
300.0000 mg | ORAL_TABLET | Freq: Every day | ORAL | Status: DC
  Administered 2017-02-15 – 2017-02-16 (×2): 300 mg via ORAL
  Filled 2017-02-15 (×2): qty 1

## 2017-02-15 MED ORDER — DIPHENHYDRAMINE HCL 50 MG/ML IJ SOLN: 12.5000 mg | Freq: Four times a day (QID) | INTRAMUSCULAR | Status: DC | PRN

## 2017-02-15 MED ORDER — HYDRALAZINE HCL 20 MG/ML IJ SOLN: 10.0000 mg | INTRAMUSCULAR | Status: DC | PRN

## 2017-02-15 MED ORDER — CEFAZOLIN SODIUM-DEXTROSE 2-4 GM/100ML-% IV SOLN
2.0000 g | INTRAVENOUS | Status: AC
  Administered 2017-02-15: 2 g via INTRAVENOUS
  Filled 2017-02-15: qty 100

## 2017-02-15 MED ORDER — LACTATED RINGERS IV SOLN
INTRAVENOUS | Status: DC | PRN
  Administered 2017-02-15 (×2): via INTRAVENOUS

## 2017-02-15 MED ORDER — BUPIVACAINE HCL (PF) 0.5 % IJ SOLN
INTRAMUSCULAR | Status: DC | PRN
  Administered 2017-02-15: 30 mL

## 2017-02-15 MED ORDER — ACETAMINOPHEN 650 MG RE SUPP: 650.0000 mg | Freq: Four times a day (QID) | RECTAL | Status: DC | PRN

## 2017-02-15 MED ORDER — FENTANYL CITRATE (PF) 100 MCG/2ML IJ SOLN
INTRAMUSCULAR | Status: DC | PRN
  Administered 2017-02-15: 25 ug via INTRAVENOUS
  Administered 2017-02-15 (×2): 50 ug via INTRAVENOUS

## 2017-02-15 MED ORDER — ACETAMINOPHEN 325 MG PO TABS
650.0000 mg | ORAL_TABLET | Freq: Four times a day (QID) | ORAL | Status: DC | PRN
Start: 2017-02-15 — End: 2017-02-16

## 2017-02-15 MED ORDER — POTASSIUM CHLORIDE CRYS ER 20 MEQ PO TBCR
20.0000 meq | EXTENDED_RELEASE_TABLET | Freq: Every day | ORAL | Status: DC
  Administered 2017-02-15 – 2017-02-16 (×2): 20 meq via ORAL
  Filled 2017-02-15 (×2): qty 1

## 2017-02-15 MED ORDER — KCL IN DEXTROSE-NACL 20-5-0.45 MEQ/L-%-% IV SOLN
INTRAVENOUS | Status: AC
  Administered 2017-02-15 – 2017-02-16 (×2): via INTRAVENOUS
  Filled 2017-02-15 (×2): qty 1000

## 2017-02-15 MED ORDER — BUPIVACAINE HCL (PF) 0.5 % IJ SOLN
INTRAMUSCULAR | Status: AC
  Filled 2017-02-15: qty 30

## 2017-02-15 MED ORDER — MORPHINE SULFATE (PF) 2 MG/ML IV SOLN
1.0000 mg | INTRAVENOUS | Status: DC | PRN
  Administered 2017-02-15: 2 mg via INTRAVENOUS
  Filled 2017-02-15: qty 1

## 2017-02-15 MED ORDER — ONDANSETRON HCL 4 MG/2ML IJ SOLN
INTRAMUSCULAR | Status: AC
  Filled 2017-02-15: qty 2

## 2017-02-15 MED ORDER — METHOCARBAMOL 500 MG PO TABS
500.0000 mg | ORAL_TABLET | Freq: Four times a day (QID) | ORAL | Status: DC | PRN
  Administered 2017-02-15: 500 mg via ORAL
  Filled 2017-02-15: qty 1

## 2017-02-15 MED ORDER — FENTANYL CITRATE (PF) 250 MCG/5ML IJ SOLN
INTRAMUSCULAR | Status: AC
  Filled 2017-02-15: qty 5

## 2017-02-15 MED ORDER — GABAPENTIN 400 MG PO CAPS
800.0000 mg | ORAL_CAPSULE | Freq: Three times a day (TID) | ORAL | Status: DC
  Administered 2017-02-15 – 2017-02-16 (×3): 800 mg via ORAL
  Filled 2017-02-15 (×2): qty 2
  Filled 2017-02-15: qty 8

## 2017-02-15 MED ORDER — HYDROCHLOROTHIAZIDE 25 MG PO TABS
25.0000 mg | ORAL_TABLET | Freq: Every day | ORAL | Status: DC
  Administered 2017-02-15 – 2017-02-16 (×2): 25 mg via ORAL
  Filled 2017-02-15 (×2): qty 1

## 2017-02-15 MED ORDER — VITAMIN C 250 MG PO TABS
125.0000 mg | ORAL_TABLET | Freq: Every day | ORAL | Status: DC
  Administered 2017-02-15 – 2017-02-16 (×2): 125 mg via ORAL
  Filled 2017-02-15 (×2): qty 1

## 2017-02-15 MED ORDER — CEFAZOLIN SODIUM-DEXTROSE 2-4 GM/100ML-% IV SOLN
2.0000 g | Freq: Three times a day (TID) | INTRAVENOUS | Status: AC
  Administered 2017-02-15: 2 g via INTRAVENOUS
  Filled 2017-02-15: qty 100

## 2017-02-15 MED ORDER — LURASIDONE HCL 60 MG PO TABS
60.0000 mg | ORAL_TABLET | Freq: Every day | ORAL | Status: DC
  Administered 2017-02-15: 60 mg via ORAL
  Filled 2017-02-15: qty 1

## 2017-02-15 MED ORDER — PROPOFOL 10 MG/ML IV BOLUS
INTRAVENOUS | Status: DC | PRN
  Administered 2017-02-15: 160 mg via INTRAVENOUS

## 2017-02-15 MED ORDER — EPHEDRINE SULFATE-NACL 50-0.9 MG/10ML-% IV SOSY
PREFILLED_SYRINGE | INTRAVENOUS | Status: DC | PRN
Start: 2017-02-15 — End: 2017-02-15
  Administered 2017-02-15: 20 mg via INTRAVENOUS

## 2017-02-15 MED ORDER — DEXAMETHASONE SODIUM PHOSPHATE 10 MG/ML IJ SOLN
INTRAMUSCULAR | Status: DC | PRN
  Administered 2017-02-15: 5 mg via INTRAVENOUS

## 2017-02-15 MED ORDER — FENTANYL CITRATE (PF) 100 MCG/2ML IJ SOLN: 25.0000 ug | INTRAMUSCULAR | Status: DC | PRN

## 2017-02-15 SURGICAL SUPPLY — 44 items
ADH SKN CLS APL DERMABOND .7 (GAUZE/BANDAGES/DRESSINGS) ×2
ATCH SMKEVC FLXB CAUT HNDSWH (FILTER) ×1 IMPLANT
BINDER BREAST LRG (GAUZE/BANDAGES/DRESSINGS) IMPLANT
BINDER BREAST XLRG (GAUZE/BANDAGES/DRESSINGS) IMPLANT
CANISTER SUCT 3000ML PPV (MISCELLANEOUS) ×4 IMPLANT
CHLORAPREP W/TINT 26ML (MISCELLANEOUS) ×2 IMPLANT
COVER SURGICAL LIGHT HANDLE (MISCELLANEOUS) ×2 IMPLANT
DERMABOND ADVANCED (GAUZE/BANDAGES/DRESSINGS) ×2
DERMABOND ADVANCED .7 DNX12 (GAUZE/BANDAGES/DRESSINGS) ×1 IMPLANT
DRAPE UTILITY XL STRL (DRAPES) ×4 IMPLANT
DRSG PAD ABDOMINAL 8X10 ST (GAUZE/BANDAGES/DRESSINGS) ×3 IMPLANT
ELECT BLADE 4.0 EZ CLEAN MEGAD (MISCELLANEOUS) ×2
ELECT CAUTERY BLADE 6.4 (BLADE) ×2 IMPLANT
ELECT REM PT RETURN 9FT ADLT (ELECTROSURGICAL) ×4
ELECTRODE BLDE 4.0 EZ CLN MEGD (MISCELLANEOUS) IMPLANT
ELECTRODE REM PT RTRN 9FT ADLT (ELECTROSURGICAL) ×2 IMPLANT
EVACUATOR SMOKE ACCUVAC VALLEY (FILTER) ×1
GAUZE SPONGE 4X4 12PLY STRL (GAUZE/BANDAGES/DRESSINGS) ×2 IMPLANT
GLOVE BIO SURGEON STRL SZ 6 (GLOVE) ×2 IMPLANT
GLOVE BIO SURGEON STRL SZ7.5 (GLOVE) ×1 IMPLANT
GLOVE BIOGEL PI IND STRL 6.5 (GLOVE) ×1 IMPLANT
GLOVE BIOGEL PI IND STRL 7.5 (GLOVE) IMPLANT
GLOVE BIOGEL PI INDICATOR 6.5 (GLOVE) ×1
GLOVE BIOGEL PI INDICATOR 7.5 (GLOVE) ×1
GOWN STRL REUS W/ TWL LRG LVL3 (GOWN DISPOSABLE) ×1 IMPLANT
GOWN STRL REUS W/TWL 2XL LVL3 (GOWN DISPOSABLE) ×2 IMPLANT
GOWN STRL REUS W/TWL LRG LVL3 (GOWN DISPOSABLE) ×2
ILLUMINATOR WAVEGUIDE N/F (MISCELLANEOUS) IMPLANT
KIT BASIN OR (CUSTOM PROCEDURE TRAY) ×2 IMPLANT
KIT ROOM TURNOVER OR (KITS) ×2 IMPLANT
LIGHT WAVEGUIDE WIDE FLAT (MISCELLANEOUS) IMPLANT
NS IRRIG 1000ML POUR BTL (IV SOLUTION) ×2 IMPLANT
PACK GENERAL/GYN (CUSTOM PROCEDURE TRAY) ×2 IMPLANT
PAD ARMBOARD 7.5X6 YLW CONV (MISCELLANEOUS) ×2 IMPLANT
PREFILTER EVAC NS 1 1/3-3/8IN (MISCELLANEOUS) ×2 IMPLANT
SPONGE LAP 18X18 X RAY DECT (DISPOSABLE) ×1 IMPLANT
STRIP CLOSURE SKIN 1/2X4 (GAUZE/BANDAGES/DRESSINGS) ×2 IMPLANT
SUT ETHILON 2 0 FS 18 (SUTURE) ×2 IMPLANT
SUT MNCRL AB 4-0 PS2 18 (SUTURE) ×1 IMPLANT
SUT MON AB 4-0 PC3 18 (SUTURE) ×2 IMPLANT
SUT VIC AB 3-0 SH 8-18 (SUTURE) ×2 IMPLANT
TOWEL OR 17X24 6PK STRL BLUE (TOWEL DISPOSABLE) ×2 IMPLANT
TOWEL OR 17X26 10 PK STRL BLUE (TOWEL DISPOSABLE) ×2 IMPLANT
TUBE CONNECTING 12X1/4 (SUCTIONS) ×2 IMPLANT

## 2017-02-15 NOTE — Anesthesia Preprocedure Evaluation (Signed)
Anesthesia Evaluation  Patient identified by MRN, date of birth, ID band Patient awake    Reviewed: Allergy & Precautions, NPO status , Patient's Chart, lab work & pertinent test results  Airway Mallampati: II  TM Distance: >3 FB     Dental   Pulmonary neg pulmonary ROS,    breath sounds clear to auscultation       Cardiovascular hypertension, + Peripheral Vascular Disease   Rhythm:Regular Rate:Normal     Neuro/Psych    GI/Hepatic negative GI ROS, Neg liver ROS,   Endo/Other  negative endocrine ROS  Renal/GU      Musculoskeletal  (+) Arthritis ,   Abdominal   Peds  Hematology  (+) anemia ,   Anesthesia Other Findings   Reproductive/Obstetrics                             Anesthesia Physical Anesthesia Plan  ASA: III  Anesthesia Plan: General   Post-op Pain Management:    Induction: Intravenous  Airway Management Planned: Oral ETT  Additional Equipment:   Intra-op Plan:   Post-operative Plan: Extubation in OR  Informed Consent: I have reviewed the patients History and Physical, chart, labs and discussed the procedure including the risks, benefits and alternatives for the proposed anesthesia with the patient or authorized representative who has indicated his/her understanding and acceptance.   Dental advisory given  Plan Discussed with: CRNA and Anesthesiologist  Anesthesia Plan Comments:         Anesthesia Quick Evaluation

## 2017-02-15 NOTE — Anesthesia Procedure Notes (Signed)
Procedure Name: LMA Insertion Date/Time: 02/15/2017 7:57 AM Performed by: Teressa Lower Pre-anesthesia Checklist: Patient identified, Emergency Drugs available, Suction available and Patient being monitored Patient Re-evaluated:Patient Re-evaluated prior to inductionOxygen Delivery Method: Circle system utilized Preoxygenation: Pre-oxygenation with 100% oxygen Intubation Type: IV induction Ventilation: Mask ventilation without difficulty LMA: LMA inserted LMA Size: 5.0 Number of attempts: 1 Placement Confirmation: positive ETCO2,  CO2 detector and breath sounds checked- equal and bilateral Tube secured with: Tape

## 2017-02-15 NOTE — Transfer of Care (Signed)
Immediate Anesthesia Transfer of Care Note  Patient: TAHJANAE BLANKENBURG  Procedure(s) Performed: Procedure(s): LEFT MASTECTOMY (Left)  Patient Location: PACU  Anesthesia Type:General  Level of Consciousness: awake and alert   Airway & Oxygen Therapy: Patient Spontanous Breathing and Patient connected to nasal cannula oxygen  Post-op Assessment: Report given to RN and Post -op Vital signs reviewed and stable  Post vital signs: Reviewed and stable  Last Vitals:  Vitals:   02/15/17 0611 02/15/17 0959  BP: 98/67 111/65  Pulse: 84 82  Resp: 18 12  Temp: 36.8 C 36.4 C    Last Pain:  Vitals:   02/15/17 0959  TempSrc:   PainSc: (P) Asleep         Complications: No apparent anesthesia complications

## 2017-02-15 NOTE — Op Note (Signed)
Left Mastectomy  Indications: This patient presents with history of left breast cancer, s/p left lumpectomy and axillary lymph node dissection, chemotherapy, radiation.  Has had severe lymphedema for 3-4 years.    Pre-operative Diagnosis: h/o left breast cancer, severe lymphedema  Post-operative Diagnosis: same  Surgeon: Stark Klein   Anesthesia: General endotracheal anesthesia and Local anesthesia 0.25.% bupivacaine, with epinephrine  ASA Class: 3  Procedure Details  The patient was seen in the Holding Room. The risks, benefits, complications, treatment options, and expected outcomes were discussed with the patient. The possibilities of reaction to medication, pulmonary aspiration, bleeding, infection, the need for additional procedures, failure to diagnose a condition, and creating a complication requiring transfusion or operation were discussed with the patient. The patient concurred with the proposed plan, giving informed consent.  The site of surgery properly noted/marked. The patient was taken to Operating Room # 9, identified as Anisia Leija and the procedure verified as Left Mastectomy. A Time Out was held and the above information confirmed.    After induction of anesthesia, the right arm, breast, and chest were prepped and draped in standard fashion.  The borders of the breast were identified and marked.  The incisions of the breast was drawn out to make sure incision lines were equidistant in length. The prior lumpectomy incision was incorporated into the incision.  The superior incision was made with the #10 blade.  The marcaine/saline mixture was infiltrated into the superior flap.  Mastectomy hooks were used to provide elevation of the skin edges, and the cautery was used to create the mastectomy flaps.  The dissection was taken to the fascia of the pectoralis major.  The penetrating vessels were clipped as needed.  The superior flap was taken medially to the lateral sternal border,  superiorly to the inferior border of the clavicle.  The inferior flap was similarly created, inferiorly to the inframammary fold and laterally to the border of the latissimus.  The breast was taken off including the pectoralis fascia and the axillary tail marked.      The wound was irrigated.  One 19 Fr blake drain was placed laterally.   Hemostasis was achieved with cautery.  The wound was irrigated and closed with a 3-0 Vicryl deep dermal interrupted sutures and 4-0 Vicryl subcuticular closure in layers.    Sterile dressings were applied. At the end of the operation, all sponge, instrument, and needle counts were correct.  Findings: grossly clear surgical margins  Estimated Blood Loss:  less than 50 mL         Drains: One 19 Blake drain                Specimens: left breast         Complications:  None; patient tolerated the procedure well.         Disposition: PACU - hemodynamically stable.         Condition: stable

## 2017-02-15 NOTE — Interval H&P Note (Signed)
History and Physical Interval Note:  02/15/2017 7:39 AM  Kristi [name]  has presented today for surgery, with the diagnosis of HISTORY OF BREAST CANCER, SEVERE LYMPHEDEMA   The various methods of treatment have been discussed with the patient and family. After consideration of risks, benefits and other options for treatment, the patient has consented to  Procedure(s): LEFT MASTECTOMY (Left) as a surgical intervention .  The patient's history has been reviewed, patient examined, no change in status, stable for surgery.  I have reviewed the patient's chart and labs.  Questions were answered to the patient's satisfaction.     Sera Hitsman

## 2017-02-15 NOTE — Discharge Instructions (Signed)
[facility] °[phone] ° °MASTECTOMY: POST OP INSTRUCTIONS ° °Always review your discharge instruction sheet given to you by the facility where your surgery was performed. °IF YOU HAVE DISABILITY OR FAMILY LEAVE FORMS, YOU MUST BRING THEM TO THE OFFICE FOR PROCESSING.   °DO NOT GIVE THEM TO YOUR DOCTOR. °A prescription for pain medication may be given to you upon discharge.  Take your pain medication as prescribed, if needed.  If narcotic pain medicine is not needed, then you may take acetaminophen (Tylenol) or ibuprofen (Advil) as needed. °1. Take your usually prescribed medications unless otherwise directed. °2. If you need a refill on your pain medication, please contact your pharmacy.  They will contact our office to request authorization.  Prescriptions will not be filled after 5pm or on week-ends. °3. You should follow a light diet the first few days after arrival home, such as soup and crackers, etc.  Resume your normal diet the day after surgery. °4. Most patients will experience some swelling and bruising on the chest and underarm.  Ice packs will help.  Swelling and bruising can take several days to resolve.  °5. It is common to experience some constipation if taking pain medication after surgery.  Increasing fluid intake and taking a stool softener (such as Colace) will usually help or prevent this problem from occurring.  A mild laxative (Milk of Magnesia or Miralax) should be taken according to package instructions if there are no bowel movements after 48 hours. °6. Unless discharge instructions indicate otherwise, leave your bandage dry and in place until your next appointment in 3-5 days.  You may take a limited sponge bath.  No tube baths or showers until the drains are removed.  You may have steri-strips (small skin tapes) in place directly over the incision.  These strips should be left on the skin for 7-10 days.  If your surgeon used skin glue on the incision, you may  shower in 24 hours.  The glue will flake off over the next 2-3 weeks.  Any sutures or staples will be removed at the office during your follow-up visit. °7. DRAINS:  If you have drains in place, it is important to keep a list of the amount of drainage produced each day in your drains.  Before leaving the hospital, you should be instructed on drain care.  Call our office if you have any questions about your drains. °8. ACTIVITIES:  You may resume regular (light) daily activities beginning the next day--such as daily self-care, walking, climbing stairs--gradually increasing activities as tolerated.  You may have sexual intercourse when it is comfortable.  Refrain from any heavy lifting or straining until approved by your doctor. °a. You may drive when you are no longer taking prescription pain medication, you can comfortably wear a seatbelt, and you can safely maneuver your car and apply brakes. °b. RETURN TO WORK:  __________________________________________________________ °9. You should see your doctor in the office for a follow-up appointment approximately 3-5 days after your surgery.  Your doctor’s nurse will typically make your follow-up appointment when she calls you with your pathology report.  Expect your pathology report 2-3 business days after your surgery.  You may call to check if you do not hear from us after three days.   °10. OTHER INSTRUCTIONS: ______________________________________________________________________________________________ ____________________________________________________________________________________________ °WHEN TO CALL YOUR DOCTOR: °1. Fever over 101.0 °2. Nausea and/or vomiting °3. Extreme swelling or bruising °4. Continued bleeding from incision. °5. Increased pain, redness, or drainage from the incision. °  The clinic staff is available to answer your questions during regular business hours.  Please don’t hesitate to call and ask to speak to one of the nurses for clinical  concerns.  If you have a medical emergency, go to the nearest emergency room or call 911.  A surgeon from [facility] is always on call at the hospital. °[address], [address], [place], [place]  [address] ? [address] [address], [place], [place]   [address] °[phone] ? [phone] ? FAX [phone] °Web site: [email or web address] °

## 2017-02-16 ENCOUNTER — Encounter (HOSPITAL_COMMUNITY): Payer: Self-pay | Admitting: General Surgery

## 2017-02-16 DIAGNOSIS — I972 Postmastectomy lymphedema syndrome: Secondary | ICD-10-CM | POA: Diagnosis not present

## 2017-02-16 LAB — BASIC METABOLIC PANEL
Anion gap: 7 (ref 5–15)
BUN: 10 mg/dL (ref 6–20)
CALCIUM: 8.9 mg/dL (ref 8.9–10.3)
CO2: 26 mmol/L (ref 22–32)
Chloride: 106 mmol/L (ref 101–111)
Creatinine, Ser: 0.94 mg/dL (ref 0.44–1.00)
GFR calc Af Amer: >60 mL/min (ref >60)
GLUCOSE: 146 mg/dL — AB (ref 65–99)
Potassium: 4.1 mmol/L (ref 3.5–5.1)
Sodium: 139 mmol/L (ref 135–145)

## 2017-02-16 LAB — CBC
HCT: 29.2 % — ABNORMAL LOW (ref 36.0–46.0)
Hemoglobin: 9.5 g/dL — ABNORMAL LOW (ref 12.0–15.0)
MCH: 27.7 pg (ref 26.0–34.0)
MCHC: 32.5 g/dL (ref 30.0–36.0)
MCV: 85.1 fL (ref 78.0–100.0)
PLATELETS: 142 10*3/uL — AB (ref 150–400)
RBC: 3.43 MIL/uL — ABNORMAL LOW (ref 3.87–5.11)
RDW: 14.9 % (ref 11.5–15.5)
WBC: 8.5 10*3/uL (ref 4.0–10.5)

## 2017-02-16 MED ORDER — METHOCARBAMOL 500 MG PO TABS: 500.0000 mg | ORAL_TABLET | Freq: Four times a day (QID) | ORAL | 2 refills | Status: DC | PRN

## 2017-02-16 MED ORDER — HYDROCODONE-ACETAMINOPHEN 5-325 MG PO TABS: 1.0000 | ORAL_TABLET | ORAL | 0 refills | Status: DC | PRN

## 2017-02-16 NOTE — Progress Notes (Signed)
Discharge instructions reviewed with patient and patient's daughter. Questions answered re: clarification of follow-up appointment time and home meds. Patient able to answer teach back questions. Patient completed returned demonstration of how to empty JP drain. Printed AVS, prescription, and JP education sheet given to patient. Patient discharged to home via wheelchair. Accompanied by daughter.

## 2017-02-16 NOTE — Care Management Note (Signed)
Case Management Note  Patient Details  Name: Kristi [name] MRN: [id number] Date of Birth: 1956/04/24  Subjective/Objective:                    Action/Plan:  No needs identified Expected Discharge Date:  02/16/17               Expected Discharge Plan:  Home/Self Care  In-House Referral:     Discharge planning Services     Post Acute Care Choice:    Choice offered to:     DME Arranged:    DME Agency:     HH Arranged:    [place] Agency:     Status of Service:  Completed, signed off  If discussed at [employer] of Stay Meetings, dates discussed:    Additional Comments:  Marilu Favre, RN 02/16/2017, 10:47 AM

## 2017-02-21 NOTE — Anesthesia Postprocedure Evaluation (Signed)
Anesthesia Post Note  Patient: Kristi [name]  Procedure(s) Performed: Procedure(s) (LRB): LEFT MASTECTOMY (Left)  Patient location during evaluation: PACU Anesthesia Type: General Level of consciousness: awake Pain management: pain level controlled Vital Signs Assessment: post-procedure vital signs reviewed and stable Respiratory status: spontaneous breathing Cardiovascular status: stable Anesthetic complications: no       Last Vitals:  Vitals:   02/15/17 2130 02/16/17 0620  BP: 96/60 111/67  Pulse: (!) 106 69  Resp: 17 18  Temp: 36.9 C 36.8 C    Last Pain:  Vitals:   02/16/17 1053  TempSrc:   PainSc: 0-No pain                 Deniece Rankin

## 2017-02-22 ENCOUNTER — Encounter: Payer: Self-pay | Admitting: Physical Therapy

## 2017-03-01 ENCOUNTER — Encounter: Payer: Self-pay | Admitting: Physical Therapy

## 2017-03-01 ENCOUNTER — Other Ambulatory Visit: Payer: Self-pay | Admitting: Hematology and Oncology

## 2017-03-01 DIAGNOSIS — C50212 Malignant neoplasm of upper-inner quadrant of left female breast: Secondary | ICD-10-CM

## 2017-03-19 ENCOUNTER — Other Ambulatory Visit: Payer: Self-pay | Admitting: Family Medicine

## 2017-03-28 ENCOUNTER — Telehealth: Payer: Self-pay | Admitting: Hematology and Oncology

## 2017-03-28 NOTE — Telephone Encounter (Signed)
Faxed labs to [facility] .[phone]

## 2017-04-03 ENCOUNTER — Ambulatory Visit: Payer: BC Managed Care – PPO | Attending: General Surgery | Admitting: Physical Therapy

## 2017-04-03 DIAGNOSIS — R293 Abnormal posture: Secondary | ICD-10-CM | POA: Diagnosis present

## 2017-04-03 DIAGNOSIS — M6281 Muscle weakness (generalized): Secondary | ICD-10-CM

## 2017-04-03 DIAGNOSIS — M25612 Stiffness of left shoulder, not elsewhere classified: Secondary | ICD-10-CM | POA: Diagnosis present

## 2017-04-03 NOTE — Therapy (Signed)
[place], [name], [address] Phone: [phone]   Fax:  [phone]  Physical Therapy Evaluation  Patient Details  Name: Kristi [name] MRN: [id number] Date of Birth: 1955/11/18 Referring Provider: Dr. Barry Dienes   Encounter Date: 04/03/2017      PT End of Session - 04/03/17 1732    Visit Number 1  begin [place] on 5th visit    Number of Visits 9   Date for PT Re-Evaluation 05/11/17   PT Start Time 8299   PT Stop Time 1600   PT Time Calculation (min) 45 min   Activity Tolerance Patient tolerated treatment well   Behavior During Therapy [facility] for tasks assessed/performed      Past Medical History:  Diagnosis Date  . Anemia    Iron deficinecy anemia  . Anxiety   . Anxiety and depression 05/15/2014  . Arthritis   . Breast cancer ([place])    left ,last radiation 2'15, last chemo 8'14  . Constipation 11/13/2016  . Depression   . History of chicken pox   . History of radiation therapy 09/09/13-10/28/13   45 gray to left breast, lumpectomy cavity boosted to 63 gray  . HTN (hypertension) 11/13/2016  . Hyperglycemia 01/09/2016  . Hypertension   . MRSA (methicillin resistant Staphylococcus aureus) 2009   right groin area-no issues now. 04-07-14 PCR screen negative today.  . Neuropathy   . Preventative health care 11/13/2016    Past Surgical History:  Procedure Laterality Date  . ANAL SPHINCTEROTOMY  04/2011  . APPENDECTOMY  1980  . AXILLARY LYMPH NODE DISSECTION Left 02/04/2013   Procedure: LEFT AXILLARY LYMPH NODE DISSECTION;  Surgeon: Stark Klein, MD;  Location: [facility];  Service: General;  Laterality: Left;  End: 3716  . BREAST LUMPECTOMY WITH NEEDLE LOCALIZATION Left 02/04/2013   Procedure: LEFT BREAST LUMPECTOMY WITH NEEDLE LOCALIZATION;  Surgeon: Stark Klein, MD;  Location: [facility];  Service: General;  Laterality: Left;  . BREAST SURGERY     Lumpectomy in april 2014  . HEMORRHOID SURGERY  04/2011   ligation  .  MASTECTOMY Left 02/15/2017  . PORT-A-CATH REMOVAL N/A 04/16/2014   Procedure: REMOVAL PORT-A-CATH;  Surgeon: Stark Klein, MD;  Location: [facility];  Service: General;  Laterality: N/A;  . PORTACATH PLACEMENT Right 02/04/2013   Procedure: INSERTION PORT-A-CATH;  Surgeon: Stark Klein, MD;  Location: [facility];  Service: General;  Laterality: Right;  Start Time: 9678.  Marland Kitchen SHOULDER ARTHROSCOPY WITH ROTATOR CUFF REPAIR AND SUBACROMIAL DECOMPRESSION Left 02/24/2014   Procedure: SHOULDER ARTHROSCOPY WITH ROTATOR CUFF REPAIR AND SUBACROMIAL DECOMPRESSION;  Surgeon: Meredith Pel, MD;  Location: [facility];  Service: Orthopedics;  Laterality: Left;  LEFT SHOULDER DIAGNOSTIC OPERATIVE ARTHROSCOPY, SUBACROMIAL DECOMPRESSION, ROTATOR CUFF TEAR REPAIR  . SIMPLE MASTECTOMY WITH AXILLARY SENTINEL NODE BIOPSY Left 02/15/2017   Procedure: LEFT MASTECTOMY;  Surgeon: Stark Klein, MD;  Location: [facility];  Service: General;  Laterality: Left;    There were no vitals filed for this visit.       Subjective Assessment - 04/03/17 1528    Subjective Pt says she is "getting used to" having left breast gone  She called for an appointment to get a mastecomey bra on June 14    Pertinent History Left lumpectomy and axillary lymph node dissection 02/04/13 followed by chemotherapy and radiation to left breast and axilla. She has struggled significantly with bilateral feet neuropathy, anxiety and depression.and continues to do so. She is not able to perform fitness  exercise becasue of this. s History includes of rotator cuff surgery on left arm  Pt has left mastectomy 02/15/2017 due to pain in breast    Patient Stated Goals to get left shoulder stronger and moving better    Currently in Pain? No/denies            [facility] PT Assessment - 04/03/17 0001      Assessment   Medical Diagnosis left breast cancer   Referring Provider Dr. Barry Dienes    Onset Date/Surgical Date 02/05/13   Hand Dominance Left   Prior Therapy prior lymphedema  therapy in 2015, and 2016 (eval only), 2017 10 sessions in 2018     Precautions   Precautions Other (comment)  lymphedema     Restrictions   Weight Bearing Restrictions No     Balance Screen   Has the patient fallen in the past 6 months No   Has the patient had a decrease in activity level because of a fear of falling?  No   Is the patient reluctant to leave their home because of a fear of falling?  No     Home Ecologist residence   Living Arrangements Spouse/significant other   Available Help at Discharge Family   Type of [place] to enter   Entrance Stairs-Number of Steps 3   Entrance Stairs-Rails None   Home Layout Two level   Alternate Level Stairs-Number of Steps 14   Alternate Level Stairs-Rails Left   [place] - single point     Prior Function   Level of Independence Independent   Vocation On disability   Leisure pt reports she does not exercise, she has an exercise bike at home      Cognition   Overall Cognitive Status Within Functional Limits for tasks assessed   Behaviors Other (comment)  pt has some word finding problems, unchanged from previously     Observation/Other Assessments   Observations pt with healing mastectomy site on left chest with no open areas.  It is depressed on her chest with little soft tissue around incision, but is present at lateral area near axilla where incision appears to be contracted int toward body.  Skin has strech marks that were present on breast.  Pec tendon is prominant with bulbous area on lateral area of tendon Scar visible on left shoulder from previous rotator cuff surgery.  Pt appears to have decrased muscle mass in left scapular area compared to right    Other Surveys  Other Surveys   Quick DASH  31.82     Sensation   Light Touch Not tested     Coordination   Gross Motor Movements are Fluid and Coordinated Yes     Posture/Postural Control    Posture/Postural Control Postural limitations   Postural Limitations Rounded Shoulders     AROM   Right Shoulder Flexion 163 Degrees   Right Shoulder ABduction 165 Degrees   Right Shoulder Internal Rotation --   Right Shoulder External Rotation --   Left Shoulder Flexion 138 Degrees   Left Shoulder ABduction 114 Degrees   Left Shoulder Internal Rotation 50 Degrees   Left Shoulder External Rotation 70 Degrees     Strength   Overall Strength Deficits  generalized deconditioning form  inablity to exercise CIPN    Overall Strength Comments left shoulder girdle muscles ~ 4/5 on left and 4+/5 on right      Palpation  Palpation comment mastectomy scar is immobile against chest wall.  She has tightness with tenderness to palpation at pec major tendon            LYMPHEDEMA/ONCOLOGY QUESTIONNAIRE - 04/03/17 1545      Type   Cancer Type Left breast     Surgeries   Lumpectomy Date 02/04/13   Axillary Lymph Node Dissection Date 02/04/13   Number Lymph Nodes Removed 19     Date Lymphedema/Swelling Started   Date 05/14/14     Treatment   Active Chemotherapy Treatment No   Past Chemotherapy Treatment Yes   Date 02/23/13   Active Radiation Treatment No   Past Radiation Treatment Yes   Date 10/28/13   Body Site left chest   Current Hormone Treatment Yes   Drug Name Tamoxifen     What other symptoms do you have   Are you Having Heaviness or Tightness --   Are you having Pain --   Are you having pitting edema --   Is it Hard or Difficult finding clothes that fit --   Do you have infections --     Lymphedema Assessments   Lymphedema Assessments Upper extremities     Right Upper Extremity Lymphedema   15 cm Proximal to Olecranon Process 30.4 cm   Olecranon Process 25 cm   15 cm Proximal to Ulnar Styloid Process 25.9 cm   10 cm Proximal to Ulnar Styloid Process 22.5 cm   Just Proximal to Ulnar Styloid Process 16 cm   Across Hand at [employer] 18.7 cm   At [place] of 2nd  Digit 6.5 cm     Left Upper Extremity Lymphedema   15 cm Proximal to Olecranon Process 31.2 cm   Olecranon Process 26 cm   15 cm Proximal to Ulnar Styloid Process 26.5 cm   10 cm Proximal to Ulnar Styloid Process 22.6 cm   Just Proximal to Ulnar Styloid Process 16 cm   Across Hand at [employer] 18.5 cm   At [place] of 2nd Digit 6.5 cm           Quick Dash - 04/03/17 0001    Open a tight or new jar No difficulty   Do heavy household chores (wash walls, wash floors) Moderate difficulty   Carry a shopping bag or briefcase No difficulty   Wash your back Moderate difficulty   Use a knife to cut food No difficulty   Recreational activities in which you take some force or impact through your arm, shoulder, or hand (golf, hammering, tennis) Unable   During the past week, to what extent has your arm, shoulder or hand problem interfered with your normal social activities with family, friends, neighbors, or groups? Modererately   During the past week, to what extent has your arm, shoulder or hand problem limited your work or other regular daily activities Modererately   Arm, shoulder, or hand pain. Mild   Tingling (pins and needles) in your arm, shoulder, or hand Mild   Difficulty Sleeping No difficulty   DASH Score 31.82 %      Objective measurements completed on examination: See above findings.                          Long Term Clinic Goals - 02/08/17 1732      CC Long Term Goal  #1   Title Pt to have an appropriate compression garment for management of left breast  lymphedema   Baseline pt not interested in wearing more compressive garments      CC Long Term Goal  #2   Title Pt to report a 75% improvement in left breast swelling for improved comfort    Baseline Pt reports 50% improvement at this time-01/24/17   Status Partially Met     CC Long Term Goal  #3   Title Pt will have an improved LLIS impairment score of less than 40% to allow for increased  comfort   Baseline 50% impairment   Status Deferred     CC Long Term Goal  #4   Status Deferred             Plan - 04-14-2017 1734    Clinical Impression Statement Mrs. Huebert returns to PT after a left mastectomy on 02/15/2017.  She has stiffenss in left shoulder with soft tissue restrctions and shoulder girdle muscle weakness. She is still limited by her CIPN symptoms and hopes to join a community exercise program for that.    History and Personal Factors relevant to plan of care: previous  ALND and lymphedema. 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    Clinical Decision Making Low   Rehab Potential Good   Clinical Impairments Affecting Rehab Potential previous radiation,CIPN and breast lymphedema with previous treatment   PT Frequency 2x / week   PT Duration 4 weeks   PT Treatment/Interventions Passive range of motion;Patient/family education;Manual techniques;Therapeutic exercise;Scar mobilization;ADLs/Self Care Home Management   PT Next Visit Plan scar mobilization and myofasical release to left chest.  Pulley wall stretches, scapular strengthening exercise, deltoid isometrics.    Consulted and Agree with Plan of Care Patient      Patient will benefit from skilled therapeutic intervention in order to improve the following deficits and impairments:  Decreased scar mobility, Increased fascial restricitons, Decreased range of motion, Decreased strength  Visit Diagnosis: Stiffness of left shoulder, not elsewhere classified  Muscle weakness (generalized)      G-Codes - 04-14-17 1732    Functional Assessment Tool Used (Outpatient Only) Quick DASH    Functional Limitation Carrying, moving and handling objects   Carrying, Moving and Handling Objects Current Status ([id number]) At least 1 percent but less than 20 percent  impaired, limited or restricted       Problem List Patient Active Problem List   Diagnosis Date Noted  . History of left breast cancer 02/15/2017  . Nodule of finger of left hand 11/14/2016  . Right hip pain 11/14/2016  . Chronic pain of both knees 11/14/2016  . Constipation 11/13/2016  . Preventative health care 11/13/2016  . HTN (hypertension) 11/13/2016  . Generalized anxiety disorder 03/14/2016    Class: Chronic  . Hyperglycemia 01/09/2016  . Chemotherapy-induced peripheral neuropathy ([place]) 09/21/2015  . History of chicken pox   . Shingles 05/15/2014  . Anxiety and depression 05/15/2014  . Rotator cuff tear 02/24/2014  . Hot flashes due to tamoxifen 02/20/2014  . Left shoulder pain with abduction 12/12/2013  . CVA (cerebral infarction) 07/18/2013  . TIA (transient ischemic attack) 07/18/2013  . Contracture of axilla 05/20/2013  . Primary cancer of upper inner quadrant of left female breast ([place]) 01/24/2013  . Anal fissure 05/03/2011  . Hemorrhoids, internal, with prolapse 05/03/2011   Donato Heinz. Owens Shark, PT  [name] 14-Apr-2017, 5:44 PM  [facility]  [place], [name], [address] Phone: [phone]   Fax:  [phone]  Name: DESTENI PISCOPO MRN: [id number] Date of Birth: [birth year]

## 2017-04-05 ENCOUNTER — Ambulatory Visit: Payer: BC Managed Care – PPO

## 2017-04-05 DIAGNOSIS — M25612 Stiffness of left shoulder, not elsewhere classified: Secondary | ICD-10-CM | POA: Diagnosis not present

## 2017-04-05 DIAGNOSIS — M6281 Muscle weakness (generalized): Secondary | ICD-10-CM

## 2017-04-05 DIAGNOSIS — R293 Abnormal posture: Secondary | ICD-10-CM

## 2017-04-05 NOTE — Therapy (Signed)
[place], [name], [address] Phone: [phone]   Fax:  [phone]  Physical Therapy Treatment  Patient Details  Name: Kristi [name] MRN: [id number] Date of Birth: September 05, 1956 Referring Provider: Dr. Barry Dienes   Encounter Date: 04/05/2017      PT End of Session - 04/05/17 1156    Visit Number 2  begin kx on 5th visit   Number of Visits 9   Date for PT Re-Evaluation 05/11/17   PT Start Time 1103   PT Stop Time 1149   PT Time Calculation (min) 46 min   Activity Tolerance Patient tolerated treatment well   Behavior During Therapy [facility] for tasks assessed/performed      Past Medical History:  Diagnosis Date  . Anemia    Iron deficinecy anemia  . Anxiety   . Anxiety and depression 05/15/2014  . Arthritis   . Breast cancer ([place])    left ,last radiation 2'15, last chemo 8'14  . Constipation 11/13/2016  . Depression   . History of chicken pox   . History of radiation therapy 09/09/13-10/28/13   45 gray to left breast, lumpectomy cavity boosted to 63 gray  . HTN (hypertension) 11/13/2016  . Hyperglycemia 01/09/2016  . Hypertension   . MRSA (methicillin resistant Staphylococcus aureus) 2009   right groin area-no issues now. 04-07-14 PCR screen negative today.  . Neuropathy   . Preventative health care 11/13/2016    Past Surgical History:  Procedure Laterality Date  . ANAL SPHINCTEROTOMY  04/2011  . APPENDECTOMY  1980  . AXILLARY LYMPH NODE DISSECTION Left 02/04/2013   Procedure: LEFT AXILLARY LYMPH NODE DISSECTION;  Surgeon: Stark Klein, MD;  Location: [place];  Service: General;  Laterality: Left;  End: 7824  . BREAST LUMPECTOMY WITH NEEDLE LOCALIZATION Left 02/04/2013   Procedure: LEFT BREAST LUMPECTOMY WITH NEEDLE LOCALIZATION;  Surgeon: Stark Klein, MD;  Location: [place];  Service: General;  Laterality: Left;  . BREAST SURGERY     Lumpectomy in april 2014  . HEMORRHOID SURGERY  04/2011   ligation  .  MASTECTOMY Left 02/15/2017  . PORT-A-CATH REMOVAL N/A 04/16/2014   Procedure: REMOVAL PORT-A-CATH;  Surgeon: Stark Klein, MD;  [facility];  Service: General;  Laterality: N/A;  . PORTACATH PLACEMENT Right 02/04/2013   Procedure: INSERTION PORT-A-CATH;  Surgeon: Stark Klein, MD;  Location: [place];  Service: General;  Laterality: Right;  Start Time: 2353.  Marland Kitchen SHOULDER ARTHROSCOPY WITH ROTATOR CUFF REPAIR AND SUBACROMIAL DECOMPRESSION Left 02/24/2014   Procedure: SHOULDER ARTHROSCOPY WITH ROTATOR CUFF REPAIR AND SUBACROMIAL DECOMPRESSION;  Surgeon: Meredith Pel, MD;  Location: [place];  Service: Orthopedics;  Laterality: Left;  LEFT SHOULDER DIAGNOSTIC OPERATIVE ARTHROSCOPY, SUBACROMIAL DECOMPRESSION, ROTATOR CUFF TEAR REPAIR  . SIMPLE MASTECTOMY WITH AXILLARY SENTINEL NODE BIOPSY Left 02/15/2017   Procedure: LEFT MASTECTOMY;  Surgeon: Stark Klein, MD;  Location: [place];  Service: General;  Laterality: Left;    There were no vitals filed for this visit.      Subjective Assessment - 04/05/17 1107    Subjective Was a little sore after last visit but other than that I felt fine.    Pertinent History Left lumpectomy and axillary lymph node dissection 02/04/13 followed by chemotherapy and radiation to left breast and axilla. She has struggled significantly with bilateral feet neuropathy, anxiety and depression.and continues to do so. She is not able to perform fitness exercise becasue of this. s History includes of rotator cuff surgery on left  arm  Pt has left mastectomy 02/15/2017 due to pain in breast    Patient Stated Goals to get left shoulder stronger and moving better    Currently in Pain? No/denies                         [facility] Adult PT Treatment/Exercise - 04/05/17 0001      Shoulder Exercises: Pulleys   Flexion 2 minutes   ABduction 2 minutes     Shoulder Exercises: Therapy Ball   Flexion 10 reps  With forward lean into end of stretch     Manual Therapy   Manual  Therapy Myofascial release;Passive ROM   Myofascial Release To Lt chest wall during P/ROM, cross hands technique here as well with vertical and horizontal hands.   Passive ROM In Supine into flexion, abduction, and er to pts tolerance of Lt shoulder.                        [facility] Goals - 04/03/17 1742      CC Long Term Goal  #1   Title left shoulder active flexion with improve to 155 degrees to allow pt to reach things off a high shelf    Baseline 138   Time 4   Period Weeks   Status New     CC Long Term Goal  #2   Title left shoulder ative abduction will improve to 140 degress to allow pt to dress herself with more ease    Baseline 114   Time 4   Period Weeks   Status New     CC Long Term Goal  #3   Title Pt will have an improvement in QuickDASH to < 20 indicating functional improvment of left arm    Baseline 31.82   Time 4   Period Weeks   Status New            Plan - 04/05/17 1157    Clinical Impression Statement Pt tolerated first session well of manual therapy to include stretching and myofascial release to Lt UE and chest. she reported feeling looser after session and had no increased pain.    Rehab Potential Good   Clinical Impairments Affecting Rehab Potential previous radiation,CIPN and breast lymphedema with previous treatment   PT Frequency 2x / week   PT Duration 4 weeks   PT Treatment/Interventions Passive range of motion;Patient/family education;Manual techniques;Therapeutic exercise;Scar mobilization;ADLs/Self Care Home Management   PT Next Visit Plan scar mobilization and myofasical release to left chest.  Pulley wall stretches, scapular strengthening exercise, deltoid isometrics.    Consulted and Agree with Plan of Care Patient      Patient will benefit from skilled therapeutic intervention in order to improve the following deficits and impairments:  Decreased scar mobility, Increased fascial restricitons, Decreased range of  motion, Decreased strength  Visit Diagnosis: Stiffness of left shoulder, not elsewhere classified  Abnormal posture  Muscle weakness (generalized)     Problem List Patient Active Problem List   Diagnosis Date Noted  . History of left breast cancer 02/15/2017  . Nodule of finger of left hand 11/14/2016  . Right hip pain 11/14/2016  . Chronic pain of both knees 11/14/2016  . Constipation 11/13/2016  . Preventative health care 11/13/2016  . HTN (hypertension) 11/13/2016  . Generalized anxiety disorder 03/14/2016    Class: Chronic  . Hyperglycemia 01/09/2016  . Chemotherapy-induced peripheral neuropathy ([place]) 09/21/2015  .  History of chicken pox   . Shingles 05/15/2014  . Anxiety and depression 05/15/2014  . Rotator cuff tear 02/24/2014  . Hot flashes due to tamoxifen 02/20/2014  . Left shoulder pain with abduction 12/12/2013  . CVA (cerebral infarction) 07/18/2013  . TIA (transient ischemic attack) 07/18/2013  . Contracture of axilla 05/20/2013  . Primary cancer of upper inner quadrant of left female breast ([place]) 01/24/2013  . Anal fissure 05/03/2011  . Hemorrhoids, internal, with prolapse 05/03/2011    Otelia Limes, PTA 04/05/2017, 12:04 PM  [place], [name], [address] Phone: [phone]   Fax:  [phone]  Name: ALANNIE AMODIO MRN: [id number] Date of Birth: 02/27/1956

## 2017-04-09 ENCOUNTER — Other Ambulatory Visit: Payer: Self-pay | Admitting: Family Medicine

## 2017-04-10 ENCOUNTER — Ambulatory Visit: Payer: BC Managed Care – PPO | Attending: General Surgery | Admitting: Physical Therapy

## 2017-04-10 DIAGNOSIS — M25612 Stiffness of left shoulder, not elsewhere classified: Secondary | ICD-10-CM

## 2017-04-10 DIAGNOSIS — M6281 Muscle weakness (generalized): Secondary | ICD-10-CM

## 2017-04-10 DIAGNOSIS — R293 Abnormal posture: Secondary | ICD-10-CM

## 2017-04-10 DIAGNOSIS — G8929 Other chronic pain: Secondary | ICD-10-CM | POA: Diagnosis present

## 2017-04-10 DIAGNOSIS — M25512 Pain in left shoulder: Secondary | ICD-10-CM | POA: Diagnosis present

## 2017-04-10 NOTE — Therapy (Signed)
[place], [name], [address] Phone: [phone]   Fax:  [phone]  Physical Therapy Treatment  Patient Details  Name: Kristi [name] MRN: [id number] Date of Birth: February 16, 1956 Referring Provider: Dr. Barry Dienes   Encounter Date: 04/10/2017      PT End of Session - 04/10/17 1715    Visit Number 3  begin [place] on 5th visit   Number of Visits 9   Date for PT Re-Evaluation 05/11/17   PT Start Time 2426   PT Stop Time 1600   PT Time Calculation (min) 45 min   Activity Tolerance Patient tolerated treatment well      Past Medical History:  Diagnosis Date  . Anemia    Iron deficinecy anemia  . Anxiety   . Anxiety and depression 05/15/2014  . Arthritis   . Breast cancer ([place])    left ,last radiation 2'15, last chemo 8'14  . Constipation 11/13/2016  . Depression   . History of chicken pox   . History of radiation therapy 09/09/13-10/28/13   45 gray to left breast, lumpectomy cavity boosted to 63 gray  . HTN (hypertension) 11/13/2016  . Hyperglycemia 01/09/2016  . Hypertension   . MRSA (methicillin resistant Staphylococcus aureus) 2009   right groin area-no issues now. 04-07-14 PCR screen negative today.  . Neuropathy   . Preventative health care 11/13/2016    Past Surgical History:  Procedure Laterality Date  . ANAL SPHINCTEROTOMY  04/2011  . APPENDECTOMY  1980  . AXILLARY LYMPH NODE DISSECTION Left 02/04/2013   Procedure: LEFT AXILLARY LYMPH NODE DISSECTION;  Surgeon: Stark Klein, MD;  Location: [place];  Service: General;  Laterality: Left;  End: 8341  . BREAST LUMPECTOMY WITH NEEDLE LOCALIZATION Left 02/04/2013   Procedure: LEFT BREAST LUMPECTOMY WITH NEEDLE LOCALIZATION;  Surgeon: Stark Klein, MD;  Location: [place];  Service: General;  Laterality: Left;  . BREAST SURGERY     Lumpectomy in april 2014  . HEMORRHOID SURGERY  04/2011   ligation  . MASTECTOMY Left 02/15/2017  . PORT-A-CATH REMOVAL N/A 04/16/2014    Procedure: REMOVAL PORT-A-CATH;  Surgeon: Stark Klein, MD;  [facility];  Service: General;  Laterality: N/A;  . PORTACATH PLACEMENT Right 02/04/2013   Procedure: INSERTION PORT-A-CATH;  Surgeon: Stark Klein, MD;  Location: [place];  Service: General;  Laterality: Right;  Start Time: 9622.  Marland Kitchen SHOULDER ARTHROSCOPY WITH ROTATOR CUFF REPAIR AND SUBACROMIAL DECOMPRESSION Left 02/24/2014   Procedure: SHOULDER ARTHROSCOPY WITH ROTATOR CUFF REPAIR AND SUBACROMIAL DECOMPRESSION;  Surgeon: Meredith Pel, MD;  Location: [place];  Service: Orthopedics;  Laterality: Left;  LEFT SHOULDER DIAGNOSTIC OPERATIVE ARTHROSCOPY, SUBACROMIAL DECOMPRESSION, ROTATOR CUFF TEAR REPAIR  . SIMPLE MASTECTOMY WITH AXILLARY SENTINEL NODE BIOPSY Left 02/15/2017   Procedure: LEFT MASTECTOMY;  Surgeon: Stark Klein, MD;  Location: [place];  Service: General;  Laterality: Left;    There were no vitals filed for this visit.      Subjective Assessment - 04/10/17 1518    Subjective Pt states she went to [employer] yesterday to get a prosthesis.  She will be getting a different permanent prosthesis also that should be more natural and lighter weight.  She also got 3 camis that she hopes are more comfortable    Pertinent History Left lumpectomy and axillary lymph node dissection 02/04/13 followed by chemotherapy and radiation to left breast and axilla. She has struggled significantly with bilateral feet neuropathy, anxiety and depression.and continues to do so.  She is not able to perform fitness exercise becasue of this. s History includes of rotator cuff surgery on left arm  Pt has left mastectomy 02/15/2017 due to pain in breast    Patient Stated Goals to get left shoulder stronger and moving better    Currently in Pain? No/denies  she does feel tightness in the chest area                          [facility] Adult PT Treatment/Exercise - 04/10/17 0001      Shoulder Exercises: Supine   Other Supine Exercises  dowel rod flexion and abduction x 10 reps      Shoulder Exercises: Seated   Abduction AROM;Left;10 reps  cues to keep trunk in upright      Shoulder Exercises: Standing   External Rotation Strengthening;Left;10 reps;Theraband  3 step walkouts    Theraband Level (Shoulder External Rotation) Level 2 (Red)   Flexion Strengthening;Left;10 reps;Theraband   Theraband Level (Shoulder Flexion) Level 2 (Red)   Extension Strengthening;Left;10 reps;Theraband   Theraband Level (Shoulder Extension) Level 2 (Red)     Shoulder Exercises: Pulleys   Flexion 2 minutes   ABduction 2 minutes     Shoulder Exercises: Therapy Ball   Flexion 10 reps  With forward lean into end of stretch     Shoulder Exercises: Isometric Strengthening   Flexion 5X5"   Extension 5X5"   ABduction 5X5"     Manual Therapy   Passive ROM In Supine into flexion, abduction, and er to pts tolerance of Lt shoulder.                PT Education - 04/10/17 1714    Education provided Yes   Education Details standing shoulder flexion and extension with red theraband    Person(s) Educated Patient   Methods Explanation;Demonstration;Handout   Comprehension Verbalized understanding                [facility] Goals - 04/03/17 1742      CC Long Term Goal  #1   Title left shoulder active flexion with improve to 155 degrees to allow pt to reach things off a high shelf    Baseline 138   Time 4   Period Weeks   Status New     CC Long Term Goal  #2   Title left shoulder ative abduction will improve to 140 degress to allow pt to dress herself with more ease    Baseline 114   Time 4   Period Weeks   Status New     CC Long Term Goal  #3   Title Pt will have an improvement in QuickDASH to < 20 indicating functional improvment of left arm    Baseline 31.82   Time 4   Period Weeks   Status New            Plan - 04/10/17 1717    Clinical Impression Statement Treatment today focues on active  exercise and pt was able to participate for full session and did well.  She is happy to have her initial proshesis and is looking forward to getting a lighter weight one.    Rehab Potential Good   Clinical Impairments Affecting Rehab Potential previous radiation,CIPN and breast lymphedema with previous treatment   PT Frequency 2x / week   PT Duration 4 weeks   PT Treatment/Interventions Passive range of motion;Patient/family education;Manual techniques;Therapeutic exercise;Scar mobilization;ADLs/Self Care Home  Management   PT Next Visit Plan scar mobilization and myofasical release to left chest.  Pulley wall stretches, scapular strengthening exercise, deltoid isometrics.    Consulted and Agree with Plan of Care Patient      Patient will benefit from skilled therapeutic intervention in order to improve the following deficits and impairments:  Decreased scar mobility, Increased fascial restricitons, Decreased range of motion, Decreased strength  Visit Diagnosis: Stiffness of left shoulder, not elsewhere classified  Abnormal posture  Muscle weakness (generalized)  Chronic left shoulder pain     Problem List Patient Active Problem List   Diagnosis Date Noted  . History of left breast cancer 02/15/2017  . Nodule of finger of left hand 11/14/2016  . Right hip pain 11/14/2016  . Chronic pain of both knees 11/14/2016  . Constipation 11/13/2016  . Preventative health care 11/13/2016  . HTN (hypertension) 11/13/2016  . Generalized anxiety disorder 03/14/2016    Class: Chronic  . Hyperglycemia 01/09/2016  . Chemotherapy-induced peripheral neuropathy ([place]) 09/21/2015  . History of chicken pox   . Shingles 05/15/2014  . Anxiety and depression 05/15/2014  . Rotator cuff tear 02/24/2014  . Hot flashes due to tamoxifen 02/20/2014  . Left shoulder pain with abduction 12/12/2013  . CVA (cerebral infarction) 07/18/2013  . TIA (transient ischemic attack) 07/18/2013  . Contracture of  axilla 05/20/2013  . Primary cancer of upper inner quadrant of left female breast ([place]) 01/24/2013  . Anal fissure 05/03/2011  . Hemorrhoids, internal, with prolapse 05/03/2011   Kristi [name] PT  Kristi [name] 04/10/2017, 5:21 PM  [place], [name], [address] Phone: [phone]   Fax:  [phone]  Name: Kristi [name] MRN: [id number] Date of Birth: 12-30-1955

## 2017-04-10 NOTE — Patient Instructions (Signed)
Strengthening: Resisted Flexion    Cancer Rehab [phone]    Hold tubing with left arm at side. Pull forward and up. Move shoulder through pain-free range of motion. Repeat _5-10___ times per set. Do _1-2___ sessions per day.     Strengthening: Resisted Extension    Hold tubing in left hand, arm forward. Pull arm back, elbow straight. Repeat __5-10__ times per set. Do __1-2__ sessions per day.

## 2017-04-12 ENCOUNTER — Ambulatory Visit: Payer: BC Managed Care – PPO

## 2017-04-12 DIAGNOSIS — M25612 Stiffness of left shoulder, not elsewhere classified: Secondary | ICD-10-CM | POA: Diagnosis not present

## 2017-04-12 DIAGNOSIS — G8929 Other chronic pain: Secondary | ICD-10-CM

## 2017-04-12 DIAGNOSIS — M25512 Pain in left shoulder: Secondary | ICD-10-CM

## 2017-04-12 DIAGNOSIS — R293 Abnormal posture: Secondary | ICD-10-CM

## 2017-04-12 DIAGNOSIS — M6281 Muscle weakness (generalized): Secondary | ICD-10-CM

## 2017-04-12 NOTE — Therapy (Signed)
[place], [name], [address] Phone: [phone]   Fax:  [phone]  Physical Therapy Treatment  Patient Details  Name: Kristi [name] MRN: [id number] Date of Birth: 1956-09-01 Referring Provider: Dr. Barry [name]   Encounter Date: 04/12/2017      PT End of Session - 04/12/17 1037    Visit Number 4  Begin kx on 5th visit   Number of Visits 9   Date for PT Re-Evaluation 05/11/17   PT Start Time 1016   PT Stop Time 1103   PT Time Calculation (min) 47 min   Activity Tolerance Patient tolerated treatment well   Behavior During Therapy [facility] for tasks assessed/performed      Past Medical History:  Diagnosis Date  . Anemia    Iron deficinecy anemia  . Anxiety   . Anxiety and depression 05/15/2014  . Arthritis   . Breast cancer ([place])    left ,last radiation 2'15, last chemo 8'14  . Constipation 11/13/2016  . Depression   . History of chicken pox   . History of radiation therapy 09/09/13-10/28/13   45 gray to left breast, lumpectomy cavity boosted to 63 gray  . HTN (hypertension) 11/13/2016  . Hyperglycemia 01/09/2016  . Hypertension   . MRSA (methicillin resistant Staphylococcus aureus) 2009   right groin area-no issues now. 04-07-14 PCR screen negative today.  . Neuropathy   . Preventative health care 11/13/2016    Past Surgical History:  Procedure Laterality Date  . ANAL SPHINCTEROTOMY  04/2011  . APPENDECTOMY  1980  . AXILLARY LYMPH NODE DISSECTION Left 02/04/2013   Procedure: LEFT AXILLARY LYMPH NODE DISSECTION;  Surgeon: Kristi Klein, MD;  Location: [place];  Service: General;  Laterality: Left;  End: 5397  . BREAST LUMPECTOMY WITH NEEDLE LOCALIZATION Left 02/04/2013   Procedure: LEFT BREAST LUMPECTOMY WITH NEEDLE LOCALIZATION;  Surgeon: Kristi Klein, MD;  Location: [place];  Service: General;  Laterality: Left;  . BREAST SURGERY     Lumpectomy in april 2014  . HEMORRHOID SURGERY  04/2011   ligation  .  MASTECTOMY Left 02/15/2017  . PORT-A-CATH REMOVAL N/A 04/16/2014   Procedure: REMOVAL PORT-A-CATH;  Surgeon: Kristi Klein, MD;  [facility];  Service: General;  Laterality: N/A;  . PORTACATH PLACEMENT Right 02/04/2013   Procedure: INSERTION PORT-A-CATH;  Surgeon: Kristi Klein, MD;  Location: [place];  Service: General;  Laterality: Right;  Start Time: 6734.  Marland Kitchen SHOULDER ARTHROSCOPY WITH ROTATOR CUFF REPAIR AND SUBACROMIAL DECOMPRESSION Left 02/24/2014   Procedure: SHOULDER ARTHROSCOPY WITH ROTATOR CUFF REPAIR AND SUBACROMIAL DECOMPRESSION;  Surgeon: Kristi Pel, MD;  Location: [place];  Service: Orthopedics;  Laterality: Left;  LEFT SHOULDER DIAGNOSTIC OPERATIVE ARTHROSCOPY, SUBACROMIAL DECOMPRESSION, ROTATOR CUFF TEAR REPAIR  . SIMPLE MASTECTOMY WITH AXILLARY SENTINEL NODE BIOPSY Left 02/15/2017   Procedure: LEFT MASTECTOMY;  Surgeon: Kristi Klein, MD;  Location: [place];  Service: General;  Laterality: Left;    There were no vitals filed for this visit.      Subjective Assessment - 04/12/17 1018    Subjective Just doing okay today, I'm having some discomfort at my Lt lateral trunk, but no pain. Wearing one of my new prosthesis today, I like it. I have another one coming that she made with a scan of my body, it takes 3 months to arrive.    Pertinent History Left lumpectomy and axillary lymph node dissection 02/04/13 followed by chemotherapy and radiation to left breast and axilla. She has struggled  significantly with bilateral feet neuropathy, anxiety and depression.and continues to do so. She is not able to perform fitness exercise becasue of this. s History includes of rotator cuff surgery on left arm  Pt has left mastectomy 02/15/2017 due to pain in breast    Patient Stated Goals to get left shoulder stronger and moving better    Currently in Pain? No/denies            [facility] PT Assessment - 04/12/17 0001      AROM   Left Shoulder Flexion 142 Degrees   Left Shoulder ABduction 133  Degrees                     [facility] Adult PT Treatment/Exercise - 04/12/17 0001      Shoulder Exercises: Supine   Flexion Strengthening;Both;10 reps;Theraband  Narrow and Wide grip 10 times each   Other Supine Exercises Bil D2 with yellow theraband 10 times each with pt returning correct therapist demonstration     Shoulder Exercises: Standing   Horizontal ABduction Strengthening;Both;10 reps;Theraband  With back/shoulders against wall   Theraband Level (Shoulder Horizontal ABduction) Level 1 (Yellow)   External Rotation Strengthening;Both;10 reps;Theraband  With back/shoulders against wall   Theraband Level (Shoulder External Rotation) Level 1 (Yellow)     Shoulder Exercises: Pulleys   Flexion 2 minutes   Flexion Limitations More scaption as pt had trouble controlling her UE to stay in flexion     Shoulder Exercises: Therapy Ball   Flexion 10 reps  With forward lean into end of stretch     Shoulder Exercises: ROM/Strengthening   Other ROM/Strengthening Exercises With UE Ranger: Lt UE abduction with Rt hand on her Lt shoulder for tactile cuing to decrease scapular compensation 10 times, and then same for flexion.     Manual Therapy   Manual Therapy Passive ROM   Passive ROM In Supine into flexion, abduction, and er to pts tolerance of Lt shoulder.                        [facility] Goals - 04/03/17 1742      CC Long Term Goal  #1   Title left shoulder active flexion with improve to 155 degrees to allow pt to reach things off a high shelf    Baseline 138   Time 4   Period Weeks   Status New     CC Long Term Goal  #2   Title left shoulder ative abduction will improve to 140 degress to allow pt to dress herself with more ease    Baseline 114   Time 4   Period Weeks   Status New     CC Long Term Goal  #3   Title Pt will have an improvement in QuickDASH to < 20 indicating functional improvment of left arm    Baseline 31.82   Time 4    Period Weeks   Status New            Plan - 04/12/17 1038    Clinical Impression Statement Continued with ative exercise progressing pot as able but did finish with P/ROM stretching to help decrease soreness pt felt after last session. [name] is also progressing well towards her goals of increased ROM, having excellent increase withb aduction this week.    Rehab Potential Good   Clinical Impairments Affecting Rehab Potential previous radiation,CIPN and breast lymphedema with previous treatment   PT Frequency  2x / week   PT Duration 4 weeks   PT Treatment/Interventions Passive range of motion;Patient/family education;Manual techniques;Therapeutic exercise;Scar mobilization;ADLs/Self Care Home Management   PT Next Visit Plan Cont with Lt UE and postural strengthening and manual therapy to Lt shoulder/chest wall.    Consulted and Agree with Plan of Care Patient      Patient will benefit from skilled therapeutic intervention in order to improve the following deficits and impairments:  Decreased scar mobility, Increased fascial restricitons, Decreased range of motion, Decreased strength  Visit Diagnosis: Stiffness of left shoulder, not elsewhere classified  Abnormal posture  Muscle weakness (generalized)  Chronic left shoulder pain     Problem List Patient Active Problem List   Diagnosis Date Noted  . History of left breast cancer 02/15/2017  . Nodule of finger of left hand 11/14/2016  . Right hip pain 11/14/2016  . Chronic pain of both knees 11/14/2016  . Constipation 11/13/2016  . Preventative health care 11/13/2016  . HTN (hypertension) 11/13/2016  . Generalized anxiety disorder 03/14/2016    Class: Chronic  . Hyperglycemia 01/09/2016  . Chemotherapy-induced peripheral neuropathy ([place]) 09/21/2015  . History of chicken pox   . Shingles 05/15/2014  . Anxiety and depression 05/15/2014  . Rotator cuff tear 02/24/2014  . Hot flashes due to tamoxifen 02/20/2014  .  Left shoulder pain with abduction 12/12/2013  . CVA (cerebral infarction) 07/18/2013  . TIA (transient ischemic attack) 07/18/2013  . Contracture of axilla 05/20/2013  . Primary cancer of upper inner quadrant of left female breast ([place]) 01/24/2013  . Anal fissure 05/03/2011  . Hemorrhoids, internal, with prolapse 05/03/2011    Otelia Limes, PTA 04/12/2017, 11:05 AM  [place], [name], [address] Phone: [phone]   Fax:  [phone]  Name: Kristi [name] MRN: [id number] Date of Birth: Apr 17, 1956

## 2017-04-17 ENCOUNTER — Ambulatory Visit: Payer: BC Managed Care – PPO | Admitting: Physical Therapy

## 2017-04-17 ENCOUNTER — Encounter: Payer: Self-pay | Admitting: Physical Therapy

## 2017-04-17 DIAGNOSIS — R293 Abnormal posture: Secondary | ICD-10-CM

## 2017-04-17 DIAGNOSIS — M6281 Muscle weakness (generalized): Secondary | ICD-10-CM

## 2017-04-17 DIAGNOSIS — M25612 Stiffness of left shoulder, not elsewhere classified: Secondary | ICD-10-CM | POA: Diagnosis not present

## 2017-04-17 DIAGNOSIS — G8929 Other chronic pain: Secondary | ICD-10-CM

## 2017-04-17 DIAGNOSIS — M25512 Pain in left shoulder: Secondary | ICD-10-CM

## 2017-04-17 NOTE — Therapy (Signed)
[place], [name], [address] Phone: [phone]   Fax:  [phone]  Physical Therapy Treatment  Patient Details  Name: Kristi [name] MRN: [id number] Date of Birth: [birth year] Referring Provider: Dr. Barry Dienes   Encounter Date: 04/17/2017      PT End of Session - 04/17/17 1730    Visit Number 5  KX   Number of Visits 9   Date for PT Re-Evaluation 05/11/17   PT Start Time 1433   PT Stop Time 1516   PT Time Calculation (min) 43 min   Activity Tolerance Patient tolerated treatment well   Behavior During Therapy [facility] for tasks assessed/performed      Past Medical History:  Diagnosis Date  . Anemia    Iron deficinecy anemia  . Anxiety   . Anxiety and depression 05/15/2014  . Arthritis   . Breast cancer ([place])    left ,last radiation 2'15, last chemo 8'14  . Constipation 11/13/2016  . Depression   . History of chicken pox   . History of radiation therapy 09/09/13-10/28/13   45 gray to left breast, lumpectomy cavity boosted to 63 gray  . HTN (hypertension) 11/13/2016  . Hyperglycemia 01/09/2016  . Hypertension   . MRSA (methicillin resistant Staphylococcus aureus) 2009   right groin area-no issues now. 04-07-14 PCR screen negative today.  . Neuropathy   . Preventative health care 11/13/2016    Past Surgical History:  Procedure Laterality Date  . ANAL SPHINCTEROTOMY  04/2011  . APPENDECTOMY  1980  . AXILLARY LYMPH NODE DISSECTION Left 02/04/2013   Procedure: LEFT AXILLARY LYMPH NODE DISSECTION;  Surgeon: Stark Klein, MD;  Location: [place];  Service: General;  Laterality: Left;  End: 6644  . BREAST LUMPECTOMY WITH NEEDLE LOCALIZATION Left 02/04/2013   Procedure: LEFT BREAST LUMPECTOMY WITH NEEDLE LOCALIZATION;  Surgeon: Stark Klein, MD;  Location: [place];  Service: General;  Laterality: Left;  . BREAST SURGERY     Lumpectomy in april 2014  . HEMORRHOID SURGERY  04/2011   ligation  . MASTECTOMY Left  02/15/2017  . PORT-A-CATH REMOVAL N/A 04/16/2014   Procedure: REMOVAL PORT-A-CATH;  Surgeon: Stark Klein, MD;  Location[facility];  Service: General;  Laterality: N/A;  . PORTACATH PLACEMENT Right 02/04/2013   Procedure: INSERTION PORT-A-CATH;  Surgeon: Stark Klein, MD;  Location: [place];  Service: General;  Laterality: Right;  Start Time: 0347.  Marland Kitchen SHOULDER ARTHROSCOPY WITH ROTATOR CUFF REPAIR AND SUBACROMIAL DECOMPRESSION Left 02/24/2014   Procedure: SHOULDER ARTHROSCOPY WITH ROTATOR CUFF REPAIR AND SUBACROMIAL DECOMPRESSION;  Surgeon: Meredith Pel, MD;  Location: [place];  Service: Orthopedics;  Laterality: Left;  LEFT SHOULDER DIAGNOSTIC OPERATIVE ARTHROSCOPY, SUBACROMIAL DECOMPRESSION, ROTATOR CUFF TEAR REPAIR  . SIMPLE MASTECTOMY WITH AXILLARY SENTINEL NODE BIOPSY Left 02/15/2017   Procedure: LEFT MASTECTOMY;  Surgeon: Stark Klein, MD;  Location: [place];  Service: General;  Laterality: Left;    There were no vitals filed for this visit.      Subjective Assessment - 04/17/17 1435    Subjective I am still having some soreness. I did not do too many exercises this weekend because I was gone.    Pertinent History Left lumpectomy and axillary lymph node dissection 02/04/13 followed by chemotherapy and radiation to left breast and axilla. She has struggled significantly with bilateral feet neuropathy, anxiety and depression.and continues to do so. She is not able to perform fitness exercise becasue of this. s History includes of rotator cuff surgery on  left arm  Pt has left mastectomy 02/15/2017 due to pain in breast    Patient Stated Goals to get left shoulder stronger and moving better    Currently in Pain? No/denies   Pain Score 0-No pain                         [facility] Adult PT Treatment/Exercise - 04/17/17 0001      Shoulder Exercises: Supine   Flexion Strengthening;Both;10 reps;Theraband  Narrow and Wide grip 10 times each   Theraband Level (Shoulder Flexion) Level 1  (Yellow)   Other Supine Exercises Bil D2 with yellow theraband 10 times each with pt returning correct therapist demonstration     Shoulder Exercises: Standing   Horizontal ABduction Strengthening;Both;10 reps;Theraband  With back/shoulders against wall   Theraband Level (Shoulder Horizontal ABduction) Level 1 (Yellow)   External Rotation Strengthening;Both;10 reps;Theraband  With back/shoulders against wall   Theraband Level (Shoulder External Rotation) Level 1 (Yellow)     Shoulder Exercises: Pulleys   Flexion 2 minutes   ABduction 2 minutes     Shoulder Exercises: Therapy Ball   Flexion 10 reps  With forward lean into end of stretch     Shoulder Exercises: ROM/Strengthening   Other ROM/Strengthening Exercises With UE Ranger: Lt UE abduction with Rt hand on her Lt shoulder for tactile cuing to decrease scapular compensation 10 times, and then same for flexion.     Manual Therapy   Manual Therapy Passive ROM   Passive ROM In Supine into flexion, abduction, and er to pts tolerance of Lt shoulder.                        [facility] Goals - 04/03/17 1742      CC Long Term Goal  #1   Title left shoulder active flexion with improve to 155 degrees to allow pt to reach things off a high shelf    Baseline 138   Time 4   Period Weeks   Status New     CC Long Term Goal  #2   Title left shoulder ative abduction will improve to 140 degress to allow pt to dress herself with more ease    Baseline 114   Time 4   Period Weeks   Status New     CC Long Term Goal  #3   Title Pt will have an improvement in QuickDASH to < 20 indicating functional improvment of left arm    Baseline 31.82   Time 4   Period Weeks   Status New            Plan - 04/17/17 1731    Clinical Impression Statement Continued with active assistive and AROM exercises today then focused on PROM for stretching into flexion, abduction and ER. Pt demonstrated good PROM with little complaints  of pain. Will continue progression of ROM and strengthening exercises.    Rehab Potential Good   Clinical Impairments Affecting Rehab Potential previous radiation,CIPN and breast lymphedema with previous treatment   PT Frequency 2x / week   PT Duration 4 weeks   PT Treatment/Interventions Passive range of motion;Patient/family education;Manual techniques;Therapeutic exercise;Scar mobilization;ADLs/Self Care Home Management   PT Next Visit Plan Cont with Lt UE and postural strengthening and manual therapy to Lt shoulder/chest wall.    Consulted and Agree with Plan of Care Patient      Patient will benefit from skilled therapeutic intervention  in order to improve the following deficits and impairments:  Decreased scar mobility, Increased fascial restricitons, Decreased range of motion, Decreased strength  Visit Diagnosis: Stiffness of left shoulder, not elsewhere classified  Abnormal posture  Muscle weakness (generalized)  Chronic left shoulder pain     Problem List Patient Active Problem List   Diagnosis Date Noted  . History of left breast cancer 02/15/2017  . Nodule of finger of left hand 11/14/2016  . Right hip pain 11/14/2016  . Chronic pain of both knees 11/14/2016  . Constipation 11/13/2016  . Preventative health care 11/13/2016  . HTN (hypertension) 11/13/2016  . Generalized anxiety disorder 03/14/2016    Class: Chronic  . Hyperglycemia 01/09/2016  . Chemotherapy-induced peripheral neuropathy ([place]) 09/21/2015  . History of chicken pox   . Shingles 05/15/2014  . Anxiety and depression 05/15/2014  . Rotator cuff tear 02/24/2014  . Hot flashes due to tamoxifen 02/20/2014  . Left shoulder pain with abduction 12/12/2013  . CVA (cerebral infarction) 07/18/2013  . TIA (transient ischemic attack) 07/18/2013  . Contracture of axilla 05/20/2013  . Primary cancer of upper inner quadrant of left female breast ([place]) 01/24/2013  . Anal fissure 05/03/2011  . Hemorrhoids,  internal, with prolapse 05/03/2011    Allyson Sabal [facility] 04/17/2017, 5:33 PM  [place], [name], [address] Phone: [phone]   Fax:  [phone]  Name: SOLANA COGGIN MRN: [id number] Date of Birth: 04-17-1956  Manus Gunning, PT 04/17/17 5:33 PM

## 2017-04-19 ENCOUNTER — Ambulatory Visit: Payer: BC Managed Care – PPO | Admitting: Physical Therapy

## 2017-04-19 DIAGNOSIS — M25612 Stiffness of left shoulder, not elsewhere classified: Secondary | ICD-10-CM

## 2017-04-19 DIAGNOSIS — R293 Abnormal posture: Secondary | ICD-10-CM

## 2017-04-19 DIAGNOSIS — G8929 Other chronic pain: Secondary | ICD-10-CM

## 2017-04-19 DIAGNOSIS — M25512 Pain in left shoulder: Secondary | ICD-10-CM

## 2017-04-19 DIAGNOSIS — M6281 Muscle weakness (generalized): Secondary | ICD-10-CM

## 2017-04-19 NOTE — Therapy (Signed)
[place], [name], [address] Phone: [phone]   Fax:  [phone]  Physical Therapy Treatment  Patient Details  Name: Kristi [name] MRN: [id number] Date of Birth: 1956-05-18 Referring Provider: Dr. Barry Dienes   Encounter Date: 04/19/2017      PT End of Session - 04/19/17 1438    Visit Number 6  KX   Number of Visits 9   PT Start Time 4496   PT Stop Time 1430   PT Time Calculation (min) 45 min   Activity Tolerance Patient tolerated treatment well   Behavior During Therapy [facility] for tasks assessed/performed      Past Medical History:  Diagnosis Date  . Anemia    Iron deficinecy anemia  . Anxiety   . Anxiety and depression 05/15/2014  . Arthritis   . Breast cancer ([place])    left ,last radiation 2'15, last chemo 8'14  . Constipation 11/13/2016  . Depression   . History of chicken pox   . History of radiation therapy 09/09/13-10/28/13   45 gray to left breast, lumpectomy cavity boosted to 63 gray  . HTN (hypertension) 11/13/2016  . Hyperglycemia 01/09/2016  . Hypertension   . MRSA (methicillin resistant Staphylococcus aureus) 2009   right groin area-no issues now. 04-07-14 PCR screen negative today.  . Neuropathy   . Preventative health care 11/13/2016    Past Surgical History:  Procedure Laterality Date  . ANAL SPHINCTEROTOMY  04/2011  . APPENDECTOMY  1980  . AXILLARY LYMPH NODE DISSECTION Left 02/04/2013   Procedure: LEFT AXILLARY LYMPH NODE DISSECTION;  Surgeon: Stark Klein, MD;  Location: [place];  Service: General;  Laterality: Left;  End: 7591  . BREAST LUMPECTOMY WITH NEEDLE LOCALIZATION Left 02/04/2013   Procedure: LEFT BREAST LUMPECTOMY WITH NEEDLE LOCALIZATION;  Surgeon: Stark Klein, MD;  Location: [place];  Service: General;  Laterality: Left;  . BREAST SURGERY     Lumpectomy in april 2014  . HEMORRHOID SURGERY  04/2011   ligation  . MASTECTOMY Left 02/15/2017  . PORT-A-CATH REMOVAL N/A  04/16/2014   Procedure: REMOVAL PORT-A-CATH;  Surgeon: Stark Klein, MD;  [facility];  Service: General;  Laterality: N/A;  . PORTACATH PLACEMENT Right 02/04/2013   Procedure: INSERTION PORT-A-CATH;  Surgeon: Stark Klein, MD;  Location: [place];  Service: General;  Laterality: Right;  Start Time: 6384.  Marland Kitchen SHOULDER ARTHROSCOPY WITH ROTATOR CUFF REPAIR AND SUBACROMIAL DECOMPRESSION Left 02/24/2014   Procedure: SHOULDER ARTHROSCOPY WITH ROTATOR CUFF REPAIR AND SUBACROMIAL DECOMPRESSION;  Surgeon: Meredith Pel, MD;  Location: [place];  Service: Orthopedics;  Laterality: Left;  LEFT SHOULDER DIAGNOSTIC OPERATIVE ARTHROSCOPY, SUBACROMIAL DECOMPRESSION, ROTATOR CUFF TEAR REPAIR  . SIMPLE MASTECTOMY WITH AXILLARY SENTINEL NODE BIOPSY Left 02/15/2017   Procedure: LEFT MASTECTOMY;  Surgeon: Stark Klein, MD;  Location: [place];  Service: General;  Laterality: Left;    There were no vitals filed for this visit.      Subjective Assessment - 04/19/17 1351    Subjective Pt states sthe Cancer Fit program at [facility] has changed.  It is now a gym and costs $40 a month, but they have trained exercise physiologists that work there.    Pertinent History Left lumpectomy and axillary lymph node dissection 02/04/13 followed by chemotherapy and radiation to left breast and axilla. She has struggled significantly with bilateral feet neuropathy, anxiety and depression.and continues to do so. She is not able to perform fitness exercise becasue of this. s  History includes of rotator cuff surgery on left arm  Pt has left mastectomy 02/15/2017 due to pain in breast    Patient Stated Goals to get left shoulder stronger and moving better    Currently in Pain? No/denies            [facility] PT Assessment - 04/19/17 0001      AROM   Left Shoulder Flexion 145 Degrees   Left Shoulder ABduction 145 Degrees                     [facility] Adult PT Treatment/Exercise - 04/19/17 0001      Elbow  Exercises   Elbow Flexion Strengthening;Both;10 reps;Seated   Bar Weights/Barbell (Elbow Flexion) 3 lbs     Knee/Hip Exercises: Standing   Hip Extension Stengthening;Both;10 reps   Forward Step Up Both;10 reps;Hand Hold: 2;Step Height: 8"   Forward Step Up Limitations both hands on wall for balance.  had touble stepping up with right leg.  Therapist with contact guard for balance      Shoulder Exercises: Seated   Elevation Strengthening;Right;Left;5 reps;Weights  2 sets of 5    Elevation Weight (lbs) 3   Abduction Strengthening;Right;Left;5 reps  2 sets of 5    ABduction Weight (lbs) 3     Shoulder Exercises: Standing   External Rotation Strengthening;Both;20 reps;Theraband  With back/shoulders against wall   Theraband Level (Shoulder External Rotation) Level 2 (Red)   Row Strengthening;Both;20 reps;Theraband  low row with elbows straight    Theraband Level (Shoulder Row) Level 2 (Red)   Retraction AROM;Both;10 reps  from wall push up positions    Other Standing Exercises wall push ups on wall x 10 reps   Other Standing Exercises side lunge with rotation to right with elbow at waist      Shoulder Exercises: Pulleys   Flexion 2 minutes   ABduction 2 minutes     Shoulder Exercises: Therapy Ball   Flexion 10 reps  With forward lean into end of stretch     Shoulder Exercises: ROM/Strengthening   Wall Wash 10 reps                         [facility] Goals - 04/03/17 1742      CC Long Term Goal  #1   Title left shoulder active flexion with improve to 155 degrees to allow pt to reach things off a high shelf    Baseline 138   Time 4   Period Weeks   Status New     CC Long Term Goal  #2   Title left shoulder ative abduction will improve to 140 degress to allow pt to dress herself with more ease    Baseline 114   Time 4   Period Weeks   Status New     CC Long Term Goal  #3   Title Pt will have an improvement in QuickDASH to < 20 indicating  functional improvment of left arm    Baseline 31.82   Time 4   Period Weeks   Status New            Plan - 04/19/17 1438    Clinical Impression Statement PT continues to improve in AROM and strengh.  Expect she will continue to improve and discharge according to plan.  Encouraged pt to continue to use her arm as much as possible within her daily activities    Rehab Potential  Good   Clinical Impairments Affecting Rehab Potential previous radiation,CIPN and breast lymphedema with previous treatment   PT Frequency 2x / week   PT Duration 4 weeks   PT Treatment/Interventions Passive range of motion;Patient/family education;Manual techniques;Therapeutic exercise;Scar mobilization;ADLs/Self Care Home Management   PT Next Visit Plan Cont with Lt UE and postural strengthening and manual therapy to Lt shoulder/chest wall.    Consulted and Agree with Plan of Care Patient      Patient will benefit from skilled therapeutic intervention in order to improve the following deficits and impairments:  Decreased scar mobility, Increased fascial restricitons, Decreased range of motion, Decreased strength  Visit Diagnosis: Stiffness of left shoulder, not elsewhere classified  Abnormal posture  Muscle weakness (generalized)  Chronic left shoulder pain     Problem List Patient Active Problem List   Diagnosis Date Noted  . History of left breast cancer 02/15/2017  . Nodule of finger of left hand 11/14/2016  . Right hip pain 11/14/2016  . Chronic pain of both knees 11/14/2016  . Constipation 11/13/2016  . Preventative health care 11/13/2016  . HTN (hypertension) 11/13/2016  . Generalized anxiety disorder 03/14/2016    Class: Chronic  . Hyperglycemia 01/09/2016  . Chemotherapy-induced peripheral neuropathy ([place]) 09/21/2015  . History of chicken pox   . Shingles 05/15/2014  . Anxiety and depression 05/15/2014  . Rotator cuff tear 02/24/2014  . Hot flashes due to tamoxifen 02/20/2014  .  Left shoulder pain with abduction 12/12/2013  . CVA (cerebral infarction) 07/18/2013  . TIA (transient ischemic attack) 07/18/2013  . Contracture of axilla 05/20/2013  . Primary cancer of upper inner quadrant of left female breast ([place]) 01/24/2013  . Anal fissure 05/03/2011  . Hemorrhoids, internal, with prolapse 05/03/2011   Donato Heinz. Owens Shark PT  [name] 04/19/2017, 2:40 PM  [place], [name], [address] Phone: [phone]   Fax:  [phone]  Name: Kristi [name] MRN: [id number] Date of Birth: January 17, 1956

## 2017-04-24 ENCOUNTER — Ambulatory Visit: Payer: BC Managed Care – PPO | Admitting: Physical Therapy

## 2017-04-24 DIAGNOSIS — M25512 Pain in left shoulder: Secondary | ICD-10-CM

## 2017-04-24 DIAGNOSIS — R293 Abnormal posture: Secondary | ICD-10-CM

## 2017-04-24 DIAGNOSIS — G8929 Other chronic pain: Secondary | ICD-10-CM

## 2017-04-24 DIAGNOSIS — M25612 Stiffness of left shoulder, not elsewhere classified: Secondary | ICD-10-CM | POA: Diagnosis not present

## 2017-04-24 DIAGNOSIS — M6281 Muscle weakness (generalized): Secondary | ICD-10-CM

## 2017-04-24 NOTE — Patient Instructions (Signed)
   DO THESE EXERCISES IN STANDING ! :-)     Over Head Pull: Narrow and Wide Grip   Cancer Rehab [phone]   On back, knees bent, feet flat, band across thighs, elbows straight but relaxed. Pull hands apart (start). Keeping elbows straight, bring arms up and over head, hands toward floor. Keep pull steady on band. Hold momentarily. Return slowly, keeping pull steady, back to start. Then do same with a wider grip on the band (past shoulder width) Repeat _5-10__ times. Band color __yellow____   Side Pull: Double Arm   On back, knees bent, feet flat. Arms perpendicular to body, shoulder level, elbows straight but relaxed. Pull arms out to sides, elbows straight. Resistance band comes across collarbones, hands toward floor. Hold momentarily. Slowly return to starting position. Repeat _5-10__ times. Band color _yellow____   Sword   On back, knees bent, feet flat, left hand on left hip, right hand above left. Pull right arm DIAGONALLY (hip to shoulder) across chest. Bring right arm along head toward floor. Hold momentarily. Slowly return to starting position. Repeat _5-10__ times. Do with left arm. Band color _yellow_____   Shoulder Rotation: Double Arm   On back, knees bent, feet flat, elbows tucked at sides, bent 90, hands palms up. Pull hands apart and down toward floor, keeping elbows near sides. Hold momentarily. Slowly return to starting position. Repeat _5-10__ times. Band color __yellow____

## 2017-04-24 NOTE — Therapy (Signed)
[place], [name], [address] Phone: [phone]   Fax:  [phone]  Physical Therapy Treatment  Patient Details  Name: Kristi [name] MRN: [id number] Date of Birth: 1956/03/22 Referring Provider: Dr. Barry Dienes   Encounter Date: 04/24/2017      PT End of Session - 04/24/17 2034    Visit Number 7   Number of Visits 9   Date for PT Re-Evaluation 05/11/17   PT Start Time 1300   PT Stop Time 1345   PT Time Calculation (min) 45 min   Activity Tolerance Patient tolerated treatment well;Patient limited by pain      Past Medical History:  Diagnosis Date  . Anemia    Iron deficinecy anemia  . Anxiety   . Anxiety and depression 05/15/2014  . Arthritis   . Breast cancer ([place])    left ,last radiation 2'15, last chemo 8'14  . Constipation 11/13/2016  . Depression   . History of chicken pox   . History of radiation therapy 09/09/13-10/28/13   45 gray to left breast, lumpectomy cavity boosted to 63 gray  . HTN (hypertension) 11/13/2016  . Hyperglycemia 01/09/2016  . Hypertension   . MRSA (methicillin resistant Staphylococcus aureus) 2009   right groin area-no issues now. 04-07-14 PCR screen negative today.  . Neuropathy   . Preventative health care 11/13/2016    Past Surgical History:  Procedure Laterality Date  . ANAL SPHINCTEROTOMY  04/2011  . APPENDECTOMY  1980  . AXILLARY LYMPH NODE DISSECTION Left 02/04/2013   Procedure: LEFT AXILLARY LYMPH NODE DISSECTION;  Surgeon: Stark Klein, MD;  Location: [place];  Service: General;  Laterality: Left;  End: 5681  . BREAST LUMPECTOMY WITH NEEDLE LOCALIZATION Left 02/04/2013   Procedure: LEFT BREAST LUMPECTOMY WITH NEEDLE LOCALIZATION;  Surgeon: Stark Klein, MD;  Location: [place];  Service: General;  Laterality: Left;  . BREAST SURGERY     Lumpectomy in april 2014  . HEMORRHOID SURGERY  04/2011   ligation  . MASTECTOMY Left 02/15/2017  . PORT-A-CATH REMOVAL N/A 04/16/2014    Procedure: REMOVAL PORT-A-CATH;  Surgeon: Stark Klein, MD;  [facility];  Service: General;  Laterality: N/A;  . PORTACATH PLACEMENT Right 02/04/2013   Procedure: INSERTION PORT-A-CATH;  Surgeon: Stark Klein, MD;  Location: [place];  Service: General;  Laterality: Right;  Start Time: 2751.  Marland Kitchen SHOULDER ARTHROSCOPY WITH ROTATOR CUFF REPAIR AND SUBACROMIAL DECOMPRESSION Left 02/24/2014   Procedure: SHOULDER ARTHROSCOPY WITH ROTATOR CUFF REPAIR AND SUBACROMIAL DECOMPRESSION;  Surgeon: Meredith Pel, MD;  Location: [facility];  Service: Orthopedics;  Laterality: Left;  LEFT SHOULDER DIAGNOSTIC OPERATIVE ARTHROSCOPY, SUBACROMIAL DECOMPRESSION, ROTATOR CUFF TEAR REPAIR  . SIMPLE MASTECTOMY WITH AXILLARY SENTINEL NODE BIOPSY Left 02/15/2017   Procedure: LEFT MASTECTOMY;  Surgeon: Stark Klein, MD;  Location: [place];  Service: General;  Laterality: Left;    There were no vitals filed for this visit.      Subjective Assessment - 04/24/17 1305    Subjective Pt states she is doing "pretty good"  She states she is trying to use her arm more. She still has some discomfort when she fixed her hair.    Pertinent History Left lumpectomy and axillary lymph node dissection 02/04/13 followed by chemotherapy and radiation to left breast and axilla. She has struggled significantly with bilateral feet neuropathy, anxiety and depression.and continues to do so. She is not able to perform fitness exercise becasue of this. s History includes of rotator cuff surgery  on left arm  Pt has left mastectomy 02/15/2017 due to pain in breast    Patient Stated Goals to get left shoulder stronger and moving better    Currently in Pain? No/denies            [facility] PT Assessment - 04/24/17 0001      AROM   Left Shoulder Flexion 150 Degrees   Left Shoulder ABduction 150 Degrees                     [facility] Adult PT Treatment/Exercise - 04/24/17 0001      Shoulder Exercises: Standing   Horizontal ABduction  Strengthening;Both;10 reps;Theraband  With back/shoulders against wall   Theraband Level (Shoulder Horizontal ABduction) Level 2 (Red)   External Rotation Strengthening;Both;20 reps;Theraband  With back/shoulders against wall   Theraband Level (Shoulder External Rotation) Level 2 (Red)   Flexion Strengthening;Left;10 reps;Theraband  with back against wall    Theraband Level (Shoulder Flexion) Level 2 (Red)   Row Strengthening;Both;20 reps;Theraband  low row with elbows straight    Theraband Level (Shoulder Row) Level 2 (Red)   Other Standing Exercises diagonal elevation with red theraband x 10 reps      Shoulder Exercises: Pulleys   Flexion 2 minutes   ABduction 2 minutes     Manual Therapy   Manual Therapy Soft tissue mobilization;Myofascial release   Soft tissue mobilization with biotone with in supine and right sidelying, to soft tissue on chest and around axilla with effort to avoid traction on incision line.                         [facility] Goals - 04/24/17 2034      CC Long Term Goal  #1   Title left shoulder active flexion with improve to 155 degrees to allow pt to reach things off a high shelf    Baseline 138 on eval, 150 on 04/24/2017   Time 4   Period Weeks   Status On-going     CC Long Term Goal  #2   Title left shoulder ative abduction will improve to 140 degress to allow pt to dress herself with more ease    Baseline 114 on eval, 150 on 04/24/2017   Time 4   Period Weeks   Status Achieved     CC Long Term Goal  #3   Title Pt will have an improvement in QuickDASH to < 20 indicating functional improvment of left arm    Baseline 31.82   Time 4   Period Weeks   Status On-going            Plan - 04/24/17 2029    Clinical Impression Statement Used biotone to soft tissue of left upper quadrant today with increased tissue excursion, but pt sitll has tightness in pac major. She has improved in ROM, but needs to continue strengthening  work.    Rehab Potential Good   Clinical Impairments Affecting Rehab Potential previous radiation,CIPN and breast lymphedema with previous treatment   PT Treatment/Interventions Passive range of motion;Patient/family education;Manual techniques;Therapeutic exercise;Scar mobilization;ADLs/Self Care Home Management   PT Next Visit Plan Cont with Lt UE and postural strengthening and manual therapy to Lt shoulder/chest wall.   Use biotone to left chest with care to avoid traction on incision as radiated skin stll appears fragile    Consulted and Agree with Plan of Care Patient      Patient will benefit  from skilled therapeutic intervention in order to improve the following deficits and impairments:  Decreased scar mobility, Increased fascial restricitons, Decreased range of motion, Decreased strength  Visit Diagnosis: Stiffness of left shoulder, not elsewhere classified  Abnormal posture  Muscle weakness (generalized)  Chronic left shoulder pain     Problem List Patient Active Problem List   Diagnosis Date Noted  . History of left breast cancer 02/15/2017  . Nodule of finger of left hand 11/14/2016  . Right hip pain 11/14/2016  . Chronic pain of both knees 11/14/2016  . Constipation 11/13/2016  . Preventative health care 11/13/2016  . HTN (hypertension) 11/13/2016  . Generalized anxiety disorder 03/14/2016    Class: Chronic  . Hyperglycemia 01/09/2016  . Chemotherapy-induced peripheral neuropathy ([place]) 09/21/2015  . History of chicken pox   . Shingles 05/15/2014  . Anxiety and depression 05/15/2014  . Rotator cuff tear 02/24/2014  . Hot flashes due to tamoxifen 02/20/2014  . Left shoulder pain with abduction 12/12/2013  . CVA (cerebral infarction) 07/18/2013  . TIA (transient ischemic attack) 07/18/2013  . Contracture of axilla 05/20/2013  . Primary cancer of upper inner quadrant of left female breast ([place]) 01/24/2013  . Anal fissure 05/03/2011  . Hemorrhoids, internal,  with prolapse 05/03/2011   Donato Heinz. Owens Shark PT  [name] 04/24/2017, 8:36 PM  [place], [name], [address] Phone: [phone]   Fax:  [phone]  Name: Kristi [name] MRN: [id number] Date of Birth: February 08, 1956

## 2017-04-26 ENCOUNTER — Ambulatory Visit: Payer: BC Managed Care – PPO | Admitting: Physical Therapy

## 2017-04-26 DIAGNOSIS — G8929 Other chronic pain: Secondary | ICD-10-CM

## 2017-04-26 DIAGNOSIS — R293 Abnormal posture: Secondary | ICD-10-CM

## 2017-04-26 DIAGNOSIS — M25612 Stiffness of left shoulder, not elsewhere classified: Secondary | ICD-10-CM

## 2017-04-26 DIAGNOSIS — M6281 Muscle weakness (generalized): Secondary | ICD-10-CM

## 2017-04-26 DIAGNOSIS — M25512 Pain in left shoulder: Secondary | ICD-10-CM

## 2017-04-26 NOTE — Therapy (Signed)
[place], [name], [address] Phone: [phone]   Fax:  [phone]  Physical Therapy Treatment  Patient Details  Name: Kristi [name] MRN: [id number] Date of Birth: 1956-08-10 Referring Provider: Dr. Barry Dienes   Encounter Date: 04/26/2017      PT End of Session - 04/26/17 1745    Visit Number 8   Number of Visits 9   Date for PT Re-Evaluation 05/11/17   PT Start Time 0626   PT Stop Time 1430   PT Time Calculation (min) 45 min   Activity Tolerance Patient tolerated treatment well   Behavior During Therapy [facility] for tasks assessed/performed      Past Medical History:  Diagnosis Date  . Anemia    Iron deficinecy anemia  . Anxiety   . Anxiety and depression 05/15/2014  . Arthritis   . Breast cancer ([place])    left ,last radiation 2'15, last chemo 8'14  . Constipation 11/13/2016  . Depression   . History of chicken pox   . History of radiation therapy 09/09/13-10/28/13   45 gray to left breast, lumpectomy cavity boosted to 63 gray  . HTN (hypertension) 11/13/2016  . Hyperglycemia 01/09/2016  . Hypertension   . MRSA (methicillin resistant Staphylococcus aureus) 2009   right groin area-no issues now. 04-07-14 PCR screen negative today.  . Neuropathy   . Preventative health care 11/13/2016    Past Surgical History:  Procedure Laterality Date  . ANAL SPHINCTEROTOMY  04/2011  . APPENDECTOMY  1980  . AXILLARY LYMPH NODE DISSECTION Left 02/04/2013   Procedure: LEFT AXILLARY LYMPH NODE DISSECTION;  Surgeon: Stark Klein, MD;  Location: [place];  Service: General;  Laterality: Left;  End: 9485  . BREAST LUMPECTOMY WITH NEEDLE LOCALIZATION Left 02/04/2013   Procedure: LEFT BREAST LUMPECTOMY WITH NEEDLE LOCALIZATION;  Surgeon: Stark Klein, MD;  Location: [place];  Service: General;  Laterality: Left;  . BREAST SURGERY     Lumpectomy in april 2014  . HEMORRHOID SURGERY  04/2011   ligation  . MASTECTOMY Left 02/15/2017   . PORT-A-CATH REMOVAL N/A 04/16/2014   Procedure: REMOVAL PORT-A-CATH;  Surgeon: Stark Klein, MD;  [facility];  Service: General;  Laterality: N/A;  . PORTACATH PLACEMENT Right 02/04/2013   Procedure: INSERTION PORT-A-CATH;  Surgeon: Stark Klein, MD;  Location: [place];  Service: General;  Laterality: Right;  Start Time: 4627.  Marland Kitchen SHOULDER ARTHROSCOPY WITH ROTATOR CUFF REPAIR AND SUBACROMIAL DECOMPRESSION Left 02/24/2014   Procedure: SHOULDER ARTHROSCOPY WITH ROTATOR CUFF REPAIR AND SUBACROMIAL DECOMPRESSION;  Surgeon: Meredith Pel, MD;  Location: [place];  Service: Orthopedics;  Laterality: Left;  LEFT SHOULDER DIAGNOSTIC OPERATIVE ARTHROSCOPY, SUBACROMIAL DECOMPRESSION, ROTATOR CUFF TEAR REPAIR  . SIMPLE MASTECTOMY WITH AXILLARY SENTINEL NODE BIOPSY Left 02/15/2017   Procedure: LEFT MASTECTOMY;  Surgeon: Stark Klein, MD;  Location: [place];  Service: General;  Laterality: Left;    There were no vitals filed for this visit.      Subjective Assessment - 04/26/17 1347    Subjective "Im okay"  She says she is able to use her arm more at home and feels like she is making slow progress    Pertinent History Left lumpectomy and axillary lymph node dissection 02/04/13 followed by chemotherapy and radiation to left breast and axilla. She has struggled significantly with bilateral feet neuropathy, anxiety and depression.and continues to do so. She is not able to perform fitness exercise becasue of this. s History includes of rotator cuff  surgery on left arm  Pt has left mastectomy 02/15/2017 due to pain in breast    Patient Stated Goals to get left shoulder stronger and moving better    Currently in Pain? No/denies                         [facility] Adult PT Treatment/Exercise - 04/26/17 0001      Shoulder Exercises: Sidelying   External Rotation Strengthening;Left;10 reps;Weights   External Rotation Weight (lbs) 3   ABduction AROM   Other Sidelying Exercises small circles pointing  to the ceiling in each direction    Other Sidelying Exercises upper trunk rotation wtih left shoulder horizontal abduction      Manual Therapy   Manual Therapy Soft tissue mobilization;Myofascial release   Soft tissue mobilization with biotone with in supine and right sidelying, to soft tissue on chest and around axilla with effort to avoid traction on incision line.                         [facility] Goals - 04/24/17 2034      CC Long Term Goal  #1   Title left shoulder active flexion with improve to 155 degrees to allow pt to reach things off a high shelf    Baseline 138 on eval, 150 on 04/24/2017   Time 4   Period Weeks   Status On-going     CC Long Term Goal  #2   Title left shoulder ative abduction will improve to 140 degress to allow pt to dress herself with more ease    Baseline 114 on eval, 150 on 04/24/2017   Time 4   Period Weeks   Status Achieved     CC Long Term Goal  #3   Title Pt will have an improvement in QuickDASH to < 20 indicating functional improvment of left arm    Baseline 31.82   Time 4   Period Weeks   Status On-going            Plan - 04/26/17 1746    Clinical Impression Statement Pt seems to responding to use of biotone to help with kin at anterior right chest, but it still is very tight  Avoiding distraction on incision line, pt seems to have slightly more skin movement.  She appears to be moving left shoulder better in functional movments with some increase in strength    Clinical Impairments Affecting Rehab Potential previous radiation,CIPN and breast lymphedema with previous treatment   PT Treatment/Interventions Passive range of motion;Patient/family education;Manual techniques;Therapeutic exercise;Scar mobilization;ADLs/Self Care Home Management   PT Next Visit Plan Cont with Lt UE and postural strengthening and manual therapy to Lt shoulder/chest wall.   Use biotone to left chest with care to avoid traction on incision as  radiated skin stll appears fragile    Consulted and Agree with Plan of Care Patient      Patient will benefit from skilled therapeutic intervention in order to improve the following deficits and impairments:     Visit Diagnosis: Stiffness of left shoulder, not elsewhere classified  Abnormal posture  Muscle weakness (generalized)  Chronic left shoulder pain     Problem List Patient Active Problem List   Diagnosis Date Noted  . History of left breast cancer 02/15/2017  . Nodule of finger of left hand 11/14/2016  . Right hip pain 11/14/2016  . Chronic pain of both knees 11/14/2016  .  Constipation 11/13/2016  . Preventative health care 11/13/2016  . HTN (hypertension) 11/13/2016  . Generalized anxiety disorder 03/14/2016    Class: Chronic  . Hyperglycemia 01/09/2016  . Chemotherapy-induced peripheral neuropathy ([place]) 09/21/2015  . History of chicken pox   . Shingles 05/15/2014  . Anxiety and depression 05/15/2014  . Rotator cuff tear 02/24/2014  . Hot flashes due to tamoxifen 02/20/2014  . Left shoulder pain with abduction 12/12/2013  . CVA (cerebral infarction) 07/18/2013  . TIA (transient ischemic attack) 07/18/2013  . Contracture of axilla 05/20/2013  . Primary cancer of upper inner quadrant of left female breast ([place]) 01/24/2013  . Anal fissure 05/03/2011  . Hemorrhoids, internal, with prolapse 05/03/2011   Donato Heinz. Owens Shark PT  [name] 04/26/2017, 5:49 PM  [place], [name], [address] Phone: [phone]   Fax:  [phone]  Name: JUNI GLAAB MRN: [id number] Date of Birth: [birth year]

## 2017-05-01 ENCOUNTER — Ambulatory Visit: Payer: BC Managed Care – PPO | Admitting: Physical Therapy

## 2017-05-03 ENCOUNTER — Ambulatory Visit: Payer: BC Managed Care – PPO | Admitting: Physical Therapy

## 2017-05-03 DIAGNOSIS — R293 Abnormal posture: Secondary | ICD-10-CM

## 2017-05-03 DIAGNOSIS — M25612 Stiffness of left shoulder, not elsewhere classified: Secondary | ICD-10-CM

## 2017-05-03 DIAGNOSIS — M25512 Pain in left shoulder: Secondary | ICD-10-CM

## 2017-05-03 DIAGNOSIS — M6281 Muscle weakness (generalized): Secondary | ICD-10-CM

## 2017-05-03 DIAGNOSIS — G8929 Other chronic pain: Secondary | ICD-10-CM

## 2017-05-03 NOTE — Therapy (Signed)
[place], [name], [address] Phone: [phone]   Fax:  [phone]  Physical Therapy Treatment  Patient Details  Name: Kristi [name] MRN: [id number] Date of Birth: [birth year] Referring Provider: Dr. Barry Dienes   Encounter Date: 05/03/2017      PT End of Session - 05/03/17 1152    Visit Number 9   Number of Visits 9   Date for PT Re-Evaluation 05/11/17   PT Start Time 1105   PT Stop Time 1147   PT Time Calculation (min) 42 min   Activity Tolerance Patient tolerated treatment well   Behavior During Therapy [facility] for tasks assessed/performed      Past Medical History:  Diagnosis Date  . Anemia    Iron deficinecy anemia  . Anxiety   . Anxiety and depression 05/15/2014  . Arthritis   . Breast cancer ([place])    left ,last radiation 2'15, last chemo 8'14  . Constipation 11/13/2016  . Depression   . History of chicken pox   . History of radiation therapy 09/09/13-10/28/13   45 gray to left breast, lumpectomy cavity boosted to 63 gray  . HTN (hypertension) 11/13/2016  . Hyperglycemia 01/09/2016  . Hypertension   . MRSA (methicillin resistant Staphylococcus aureus) 2009   right groin area-no issues now. 04-07-14 PCR screen negative today.  . Neuropathy   . Preventative health care 11/13/2016    Past Surgical History:  Procedure Laterality Date  . ANAL SPHINCTEROTOMY  04/2011  . APPENDECTOMY  1980  . AXILLARY LYMPH NODE DISSECTION Left 02/04/2013   Procedure: LEFT AXILLARY LYMPH NODE DISSECTION;  Surgeon: Stark Klein, MD;  Location: [place];  Service: General;  Laterality: Left;  End: 9449  . BREAST LUMPECTOMY WITH NEEDLE LOCALIZATION Left 02/04/2013   Procedure: LEFT BREAST LUMPECTOMY WITH NEEDLE LOCALIZATION;  Surgeon: Stark Klein, MD;  Location: [place];  Service: General;  Laterality: Left;  . BREAST SURGERY     Lumpectomy in april 2014  . HEMORRHOID SURGERY  04/2011   ligation  . MASTECTOMY Left 02/15/2017   . PORT-A-CATH REMOVAL N/A 04/16/2014   Procedure: REMOVAL PORT-A-CATH;  Surgeon: Stark Klein, MD;  [facility];  Service: General;  Laterality: N/A;  . PORTACATH PLACEMENT Right 02/04/2013   Procedure: INSERTION PORT-A-CATH;  Surgeon: Stark Klein, MD;  Location: [place];  Service: General;  Laterality: Right;  Start Time: 6759.  Marland Kitchen SHOULDER ARTHROSCOPY WITH ROTATOR CUFF REPAIR AND SUBACROMIAL DECOMPRESSION Left 02/24/2014   Procedure: SHOULDER ARTHROSCOPY WITH ROTATOR CUFF REPAIR AND SUBACROMIAL DECOMPRESSION;  Surgeon: Meredith Pel, MD;  Location: [place];  Service: Orthopedics;  Laterality: Left;  LEFT SHOULDER DIAGNOSTIC OPERATIVE ARTHROSCOPY, SUBACROMIAL DECOMPRESSION, ROTATOR CUFF TEAR REPAIR  . SIMPLE MASTECTOMY WITH AXILLARY SENTINEL NODE BIOPSY Left 02/15/2017   Procedure: LEFT MASTECTOMY;  Surgeon: Stark Klein, MD;  Location: [place];  Service: General;  Laterality: Left;    There were no vitals filed for this visit.      Subjective Assessment - 05/03/17 1109    Subjective Pt states she is able to do more with her left shoulder    Pertinent History Left lumpectomy and axillary lymph node dissection 02/04/13 followed by chemotherapy and radiation to left breast and axilla. She has struggled significantly with bilateral feet neuropathy, anxiety and depression.and continues to do so. She is not able to perform fitness exercise becasue of this. s History includes of rotator cuff surgery on left arm  Pt has left mastectomy 02/15/2017 due  to pain in breast    Patient Stated Goals to get left shoulder stronger and moving better   on 05/03/2017 pt states her arm is stronger and that she can do more with it    Currently in Pain? No/denies                         [facility] Adult PT Treatment/Exercise - 05/03/17 0001      Shoulder Exercises: Standing   External Rotation Strengthening;Left;10 reps  sidestepping "walkouts"  x 10    Theraband Level (Shoulder External Rotation)  Level 2 (Red)   Other Standing Exercises scaption x 10 reps x 2 with 2# weights in heach hand      Manual Therapy   Manual Therapy Soft tissue mobilization;Myofascial release   Soft tissue mobilization with biotone with in supine and right sidelying, to soft tissue on chest and around axilla with effort to avoid traction on incision line.                         [facility] Goals - 04/24/17 2034      CC Long Term Goal  #1   Title left shoulder active flexion with improve to 155 degrees to allow pt to reach things off a high shelf    Baseline 138 on eval, 150 on 04/24/2017   Time 4   Period Weeks   Status On-going     CC Long Term Goal  #2   Title left shoulder ative abduction will improve to 140 degress to allow pt to dress herself with more ease    Baseline 114 on eval, 150 on 04/24/2017   Time 4   Period Weeks   Status Achieved     CC Long Term Goal  #3   Title Pt will have an improvement in QuickDASH to < 20 indicating functional improvment of left arm    Baseline 31.82   Time 4   Period Weeks   Status On-going            Plan - 05/03/17 1152    Clinical Impression Statement Pt says she feels she is able to move her left arm better.  She says she is doing her shoulder exercises at home, but some atrophy is still visible around scapula.  Skin around chest appears better, but is still tight. Pt states that some of the glue came off of his incision.    PT Next Visit Plan Cont with Lt UE and postural strengthening and manual therapy to Lt shoulder/chest wall.   Use biotone to left chest with care to avoid traction on incision as radiated skin stll appears fragile Discharge next visit.    Consulted and Agree with Plan of Care Patient      Patient will benefit from skilled therapeutic intervention in order to improve the following deficits and impairments:  Decreased scar mobility, Increased fascial restricitons, Decreased range of motion, Decreased  strength  Visit Diagnosis: Stiffness of left shoulder, not elsewhere classified  Abnormal posture  Muscle weakness (generalized)  Chronic left shoulder pain     Problem List Patient Active Problem List   Diagnosis Date Noted  . History of left breast cancer 02/15/2017  . Nodule of finger of left hand 11/14/2016  . Right hip pain 11/14/2016  . Chronic pain of both knees 11/14/2016  . Constipation 11/13/2016  . Preventative health care 11/13/2016  . HTN (hypertension) 11/13/2016  .  Generalized anxiety disorder 03/14/2016    Class: Chronic  . Hyperglycemia 01/09/2016  . Chemotherapy-induced peripheral neuropathy ([place]) 09/21/2015  . History of chicken pox   . Shingles 05/15/2014  . Anxiety and depression 05/15/2014  . Rotator cuff tear 02/24/2014  . Hot flashes due to tamoxifen 02/20/2014  . Left shoulder pain with abduction 12/12/2013  . CVA (cerebral infarction) 07/18/2013  . TIA (transient ischemic attack) 07/18/2013  . Contracture of axilla 05/20/2013  . Primary cancer of upper inner quadrant of left female breast ([place]) 01/24/2013  . Anal fissure 05/03/2011  . Hemorrhoids, internal, with prolapse 05/03/2011   Donato Heinz. Owens Shark PT  [name] 05/03/2017, 12:18 PM  [place], [name], [address] Phone: [phone]   Fax:  [phone]  Name: Kristi [name] MRN: [id number] Date of Birth: June 24, 1956

## 2017-05-08 ENCOUNTER — Ambulatory Visit: Payer: BC Managed Care – PPO | Attending: General Surgery | Admitting: Physical Therapy

## 2017-05-08 DIAGNOSIS — R293 Abnormal posture: Secondary | ICD-10-CM | POA: Diagnosis present

## 2017-05-08 DIAGNOSIS — M25512 Pain in left shoulder: Secondary | ICD-10-CM | POA: Diagnosis present

## 2017-05-08 DIAGNOSIS — M25612 Stiffness of left shoulder, not elsewhere classified: Secondary | ICD-10-CM | POA: Diagnosis present

## 2017-05-08 DIAGNOSIS — G8929 Other chronic pain: Secondary | ICD-10-CM | POA: Diagnosis present

## 2017-05-08 DIAGNOSIS — M6281 Muscle weakness (generalized): Secondary | ICD-10-CM

## 2017-05-08 NOTE — Therapy (Signed)
[place], [name], [address] Phone: [phone]   Fax:  [phone]  Physical Therapy Treatment  Patient Details  Name: Kristi [name] MRN: [id number] Date of Birth: [birth year] Referring Provider: Dr. Barry Dienes   Encounter Date: 05/08/2017      PT End of Session - 05/08/17 1342    Visit Number 10   Number of Visits 9   Date for PT Re-Evaluation 05/11/17   PT Start Time 1308  pt did not need full treatment    PT Stop Time 1342   PT Time Calculation (min) 34 min   Activity Tolerance Patient tolerated treatment well   Behavior During Therapy [facility] for tasks assessed/performed      Past Medical History:  Diagnosis Date  . Anemia    Iron deficinecy anemia  . Anxiety   . Anxiety and depression 05/15/2014  . Arthritis   . Breast cancer ([place])    left ,last radiation 2'15, last chemo 8'14  . Constipation 11/13/2016  . Depression   . History of chicken pox   . History of radiation therapy 09/09/13-10/28/13   45 gray to left breast, lumpectomy cavity boosted to 63 gray  . HTN (hypertension) 11/13/2016  . Hyperglycemia 01/09/2016  . Hypertension   . MRSA (methicillin resistant Staphylococcus aureus) 2009   right groin area-no issues now. 04-07-14 PCR screen negative today.  . Neuropathy   . Preventative health care 11/13/2016    Past Surgical History:  Procedure Laterality Date  . ANAL SPHINCTEROTOMY  04/2011  . APPENDECTOMY  1980  . AXILLARY LYMPH NODE DISSECTION Left 02/04/2013   Procedure: LEFT AXILLARY LYMPH NODE DISSECTION;  Surgeon: Stark Klein, MD;  Location: [place];  Service: General;  Laterality: Left;  End: 2440  . BREAST LUMPECTOMY WITH NEEDLE LOCALIZATION Left 02/04/2013   Procedure: LEFT BREAST LUMPECTOMY WITH NEEDLE LOCALIZATION;  Surgeon: Stark Klein, MD;  Location: [place];  Service: General;  Laterality: Left;  . BREAST SURGERY     Lumpectomy in april 2014  . HEMORRHOID SURGERY  04/2011   ligation   . MASTECTOMY Left 02/15/2017  . PORT-A-CATH REMOVAL N/A 04/16/2014   Procedure: REMOVAL PORT-A-CATH;  Surgeon: Stark Klein, MD;  Location: [facility];  Service: General;  Laterality: N/A;  . PORTACATH PLACEMENT Right 02/04/2013   Procedure: INSERTION PORT-A-CATH;  Surgeon: Stark Klein, MD;  Location: [facility];  Service: General;  Laterality: Right;  Start Time: 1027.  Marland Kitchen SHOULDER ARTHROSCOPY WITH ROTATOR CUFF REPAIR AND SUBACROMIAL DECOMPRESSION Left 02/24/2014   Procedure: SHOULDER ARTHROSCOPY WITH ROTATOR CUFF REPAIR AND SUBACROMIAL DECOMPRESSION;  Surgeon: Meredith Pel, MD;  Location: [facility];  Service: Orthopedics;  Laterality: Left;  LEFT SHOULDER DIAGNOSTIC OPERATIVE ARTHROSCOPY, SUBACROMIAL DECOMPRESSION, ROTATOR CUFF TEAR REPAIR  . SIMPLE MASTECTOMY WITH AXILLARY SENTINEL NODE BIOPSY Left 02/15/2017   Procedure: LEFT MASTECTOMY;  Surgeon: Stark Klein, MD;  Location: [facility];  Service: General;  Laterality: Left;    There were no vitals filed for this visit.      Subjective Assessment - 05/08/17 1312    Subjective Pt states her shoulder "seems to be doing OK"  She is concerned about her brother who is receiving results from a PET scan for stomach cancer today    Pertinent History Left lumpectomy and axillary lymph node dissection 02/04/13 followed by chemotherapy and radiation to left breast and axilla. She has struggled significantly with bilateral feet neuropathy, anxiety and depression.and continues to do so. She is not able  to perform fitness exercise becasue of this. s History includes of rotator cuff surgery on left arm  Pt has left mastectomy 02/15/2017 due to pain in breast    Patient Stated Goals to get left shoulder stronger and moving better   on 05/03/2017 pt states her arm is stronger and that she can do more with it    Currently in Pain? No/denies            [facility] PT Assessment - 05/08/17 0001      Observation/Other Assessments   Quick DASH  36.36     AROM   Left Shoulder  Flexion 150 Degrees   Left Shoulder ABduction 150 Degrees              Quick Dash - 05/08/17 0001    Open a tight or new jar Moderate difficulty   Do heavy household chores (wash walls, wash floors) Moderate difficulty   Carry a shopping bag or briefcase Mild difficulty   Wash your back Mild difficulty   Use a knife to cut food Mild difficulty   Recreational activities in which you take some force or impact through your arm, shoulder, or hand (golf, hammering, tennis) Unable   During the past week, to what extent has your arm, shoulder or hand problem interfered with your normal social activities with family, friends, neighbors, or groups? Modererately   During the past week, to what extent has your arm, shoulder or hand problem limited your work or other regular daily activities Modererately   Arm, shoulder, or hand pain. None   Tingling (pins and needles) in your arm, shoulder, or hand Mild   Difficulty Sleeping No difficulty   DASH Score 36.36 %               [facility] Adult PT Treatment/Exercise - 05/08/17 0001      Manual Therapy   Manual Therapy Soft tissue mobilization;Myofascial release   Soft tissue mobilization with biotone with in supine and right sidelying, to soft tissue on chest and around axilla with effort to avoid traction on incision line.    Passive ROM In Supine into flexion, abduction, and er to pts tolerance of Lt shoulder.                        Long Term Clinic Goals - 05/08/17 1314      CC Long Term Goal  #1   Title left shoulder active flexion with improve to 155 degrees to allow pt to reach things off a high shelf    Baseline 138 on eval, 150 on 04/24/2017 150 on 05/08/2017   Status Partially Met     CC Long Term Goal  #2   Title left shoulder ative abduction will improve to 140 degress to allow pt to dress herself with more ease    Baseline 114 on eval, 150 on 04/24/2017   Status Achieved     CC Long Term Goal  #3   Title Pt  will have an improvement in QuickDASH to < 20 indicating functional improvment of left arm    Baseline 31.82,  On 05/08/2017 on 36.36    Status Not Met            Plan - 05/08/17 1343    Clinical Impression Statement Pt feels that she is moving her arm better and has no pain, but she has an increase in her Quick DASH score. She has increased tissue excursion at left  chest and has better AROM and PROM  She is doing her exercises at home and plans to go to a community exercise program.  She feels that she is ready to discharge from this session    PT Next Visit Plan Discharge       Patient will benefit from skilled therapeutic intervention in order to improve the following deficits and impairments:     Visit Diagnosis: Stiffness of left shoulder, not elsewhere classified  Abnormal posture  Muscle weakness (generalized)  Chronic left shoulder pain       G-Codes - May 13, 2017 1436    Functional Assessment Tool Used (Outpatient Only) Quick DASH    Functional Limitation Carrying, moving and handling objects   Carrying, Moving and Handling Objects Goal Status (G4010) At least 1 percent but less than 20 percent impaired, limited or restricted   Carrying, Moving and Handling Objects Discharge Status [phone]) At least 20 percent but less than 40 percent impaired, limited or restricted      Problem List Patient Active Problem List   Diagnosis Date Noted  . History of left breast cancer 02/15/2017  . Nodule of finger of left hand 11/14/2016  . Right hip pain 11/14/2016  . Chronic pain of both knees 11/14/2016  . Constipation 11/13/2016  . Preventative health care 11/13/2016  . HTN (hypertension) 11/13/2016  . Generalized anxiety disorder 03/14/2016    Class: Chronic  . Hyperglycemia 01/09/2016  . Chemotherapy-induced peripheral neuropathy ([place]) 09/21/2015  . History of chicken pox   . Shingles 05/15/2014  . Anxiety and depression 05/15/2014  . Rotator cuff tear 02/24/2014  . Hot  flashes due to tamoxifen 02/20/2014  . Left shoulder pain with abduction 12/12/2013  . CVA (cerebral infarction) 07/18/2013  . TIA (transient ischemic attack) 07/18/2013  . Contracture of axilla 05/20/2013  . Primary cancer of upper inner quadrant of left female breast ([place]) 01/24/2013  . Anal fissure 05/03/2011  . Hemorrhoids, internal, with prolapse 05/03/2011   PHYSICAL THERAPY DISCHARGE SUMMARY  Visits from Start of Care: 10  Current functional level related to goals / functional outcomes: As above  Pt feels she can move her arm better with less pain so her goal is met    Remaining deficits: decrased shoulder ROM and strength in end range    Education / Equipment: Home exercise   Plan: Patient agrees to discharge.  Patient goals were partially met. Patient is being discharged due to being pleased with the current functional level.  ?????    Donato Heinz. Owens Shark PT  [name] May 13, 2017, 2:38 PM  [place], [name], [address] Phone: [phone]   Fax:  [phone]  Name: MADIGAN ROSENSTEEL MRN: [id number] Date of Birth: 05-02-1956

## 2017-05-14 ENCOUNTER — Encounter: Payer: Self-pay | Admitting: Family Medicine

## 2017-05-14 ENCOUNTER — Ambulatory Visit: Payer: Self-pay | Admitting: Family Medicine

## 2017-05-14 ENCOUNTER — Ambulatory Visit (INDEPENDENT_AMBULATORY_CARE_PROVIDER_SITE_OTHER): Payer: BC Managed Care – PPO | Admitting: Family Medicine

## 2017-05-14 VITALS — BP 98/72 | HR 91 | Temp 98.1°F | Resp 18 | Wt 184.2 lb

## 2017-05-14 DIAGNOSIS — C50212 Malignant neoplasm of upper-inner quadrant of left female breast: Secondary | ICD-10-CM | POA: Diagnosis not present

## 2017-05-14 DIAGNOSIS — E785 Hyperlipidemia, unspecified: Secondary | ICD-10-CM | POA: Diagnosis not present

## 2017-05-14 DIAGNOSIS — T451X5A Adverse effect of antineoplastic and immunosuppressive drugs, initial encounter: Secondary | ICD-10-CM

## 2017-05-14 DIAGNOSIS — F329 Major depressive disorder, single episode, unspecified: Secondary | ICD-10-CM

## 2017-05-14 DIAGNOSIS — M25512 Pain in left shoulder: Secondary | ICD-10-CM

## 2017-05-14 DIAGNOSIS — F419 Anxiety disorder, unspecified: Secondary | ICD-10-CM | POA: Diagnosis not present

## 2017-05-14 DIAGNOSIS — K5900 Constipation, unspecified: Secondary | ICD-10-CM | POA: Diagnosis not present

## 2017-05-14 DIAGNOSIS — F32A Depression, unspecified: Secondary | ICD-10-CM

## 2017-05-14 DIAGNOSIS — D649 Anemia, unspecified: Secondary | ICD-10-CM

## 2017-05-14 DIAGNOSIS — G620 Drug-induced polyneuropathy: Secondary | ICD-10-CM | POA: Diagnosis not present

## 2017-05-14 DIAGNOSIS — G8929 Other chronic pain: Secondary | ICD-10-CM | POA: Diagnosis not present

## 2017-05-14 DIAGNOSIS — R739 Hyperglycemia, unspecified: Secondary | ICD-10-CM

## 2017-05-14 HISTORY — DX: Anemia, unspecified: D64.9

## 2017-05-14 HISTORY — DX: Hyperlipidemia, unspecified: E78.5

## 2017-05-14 LAB — COMPREHENSIVE METABOLIC PANEL
ALBUMIN: 4.1 g/dL (ref 3.5–5.2)
ALT: 14 U/L (ref 0–35)
AST: 19 U/L (ref 0–37)
Alkaline Phosphatase: 76 U/L (ref 39–117)
BILIRUBIN TOTAL: 0.3 mg/dL (ref 0.2–1.2)
BUN: 15 mg/dL (ref 6–23)
CHLORIDE: 101 meq/L (ref 96–112)
CO2: 32 mEq/L (ref 19–32)
CREATININE: 1.09 mg/dL (ref 0.40–1.20)
Calcium: 10.1 mg/dL (ref 8.4–10.5)
GFR: 65.35 mL/min (ref >60.00)
Glucose, Bld: 94 mg/dL (ref 70–99)
Potassium: 4.2 mEq/L (ref 3.5–5.1)
SODIUM: 138 meq/L (ref 135–145)
Total Protein: 7.3 g/dL (ref 6.0–8.3)

## 2017-05-14 LAB — LIPID PANEL
CHOLESTEROL: 167 mg/dL (ref 0–200)
HDL: 47.6 mg/dL (ref >39.00)
LDL Cholesterol: 96 mg/dL (ref 0–99)
NonHDL: 119.09
Total CHOL/HDL Ratio: 4
Triglycerides: 113 mg/dL (ref 0.0–149.0)
VLDL: 22.6 mg/dL (ref 0.0–40.0)

## 2017-05-14 LAB — CBC
HEMATOCRIT: 34 % — AB (ref 36.0–46.0)
Hemoglobin: 11.3 g/dL — ABNORMAL LOW (ref 12.0–15.0)
MCHC: 33.1 g/dL (ref 30.0–36.0)
MCV: 86 fl (ref 78.0–100.0)
Platelets: 162 10*3/uL (ref 150.0–400.0)
RBC: 3.96 Mil/uL (ref 3.87–5.11)
RDW: 14.8 % (ref 11.5–15.5)
WBC: 5.5 10*3/uL (ref 4.0–10.5)

## 2017-05-14 LAB — TSH: TSH: 1.07 u[IU]/mL (ref 0.35–4.50)

## 2017-05-14 LAB — HEMOGLOBIN A1C: HEMOGLOBIN A1C: 5.9 % (ref 4.6–6.5)

## 2017-05-14 NOTE — Patient Instructions (Addendum)
Encouraged increased hydration and fiber in diet. Daily probiotics. If bowels not moving can use MOM 2 tbls po in 4 oz of warm prune juice by mouth every 2-3 days. If no results then repeat in 4 hours with  Dulcolax suppository pr, may repeat again in 4 more hours as needed. Seek care if symptoms worsen. Consider daily Miralax and/or Dulcolax if symptoms persist.   Shingrix is the new shingles call insurance to make sure they cover then can make nurse appointment to get shot or go to pharmacy to get shot Constipation, Adult Constipation is when a person has fewer bowel movements in a week than normal, has difficulty having a bowel movement, or has stools that are dry, hard, or larger than normal. Constipation may be caused by an underlying condition. It may become worse with age if a person takes certain medicines and does not take in enough fluids. Follow these instructions at home: Eating and drinking   Eat foods that have a lot of fiber, such as fresh fruits and vegetables, whole grains, and beans.  Limit foods that are high in fat, low in fiber, or overly processed, such as french fries, hamburgers, cookies, candies, and soda.  Drink enough fluid to keep your urine clear or pale yellow. General instructions  Exercise regularly or as told by your health care provider.  Go to the restroom when you have the urge to go. Do not hold it in.  Take over-the-counter and prescription medicines only as told by your health care provider. These include any fiber supplements.  Practice pelvic floor retraining exercises, such as deep breathing while relaxing the lower abdomen and pelvic floor relaxation during bowel movements.  Watch your condition for any changes.  Keep all follow-up visits as told by your health care provider. This is important. Contact a health care provider if:  You have pain that gets worse.  You have a fever.  You do not have a bowel movement after 4 days.  You  vomit.  You are not hungry.  You lose weight.  You are bleeding from the anus.  You have thin, pencil-like stools. Get help right away if:  You have a fever and your symptoms suddenly get worse.  You leak stool or have blood in your stool.  Your abdomen is bloated.  You have severe pain in your abdomen.  You feel dizzy or you faint. This information is not intended to replace advice given to you by your health care provider. Make sure you discuss any questions you have with your health care provider. Document Released: 07/21/2004 Document Revised: 05/12/2016 Document Reviewed: 04/12/2016 Elsevier Interactive Patient Education  2017 [employer].

## 2017-05-14 NOTE — Progress Notes (Signed)
Subjective:  I acted as a Education administrator for Dr. Charlett Blake. [name], [place]  Patient ID: Kristi [name], female    DOB: 10-23-1956, 61 y.o.   MRN: [id number]  No chief complaint on file.   HPI  Patient is in today for a 6 month follow up. She feels well today although she does have some ongoing concerns. She continues to struggle with neuropathy and pain in both feet which has been present since her breast cancer radiation. It is slowly worsening and at this point keeps her awake at night at times. No recent falls or injury. She is currently struggling with left shoulder pain and dysfunction and has had a recent course of physical therapy which is only marginally been helpful. No recent acute febrile illness or hospitalizations. Denies CP/palp/SOB/HA/congestion/fevers/GI or GU c/o. Taking meds as prescribed  Patient Care Team: Mosie Lukes, MD as PCP - General (Family Medicine) Nicholas Lose, MD as Consulting Physician (Hematology and Oncology) Lin Landsman, MD as Consulting Physician (Family Medicine) Inocencio Homes, DPM as Consulting Physician (Podiatry) Gery Pray, MD as Consulting Physician (Radiation Oncology) Dan Humphreys as Consulting Physician (Nurse Practitioner)   Past Medical History:  Diagnosis Date  . Anemia    Iron deficinecy anemia  . Anemia 05/14/2017  . Anxiety   . Anxiety and depression 05/15/2014  . Arthritis   . Breast cancer ([place])    left ,last radiation 2'15, last chemo 8'14  . Constipation 11/13/2016  . Depression   . History of chicken pox   . History of radiation therapy 09/09/13-10/28/13   45 gray to left breast, lumpectomy cavity boosted to 63 gray  . HTN (hypertension) 11/13/2016  . Hyperglycemia 01/09/2016  . Hyperlipidemia 05/14/2017  . Hypertension   . MRSA (methicillin resistant Staphylococcus aureus) 2009   right groin area-no issues now. 04-07-14 PCR screen negative today.  . Neuropathy   . Preventative health care 11/13/2016    Past Surgical History:    Procedure Laterality Date  . ANAL SPHINCTEROTOMY  04/2011  . APPENDECTOMY  1980  . AXILLARY LYMPH NODE DISSECTION Left 02/04/2013   Procedure: LEFT AXILLARY LYMPH NODE DISSECTION;  Surgeon: Stark Klein, MD;  Location: [facility];  Service: General;  Laterality: Left;  End: 5789  . BREAST LUMPECTOMY WITH NEEDLE LOCALIZATION Left 02/04/2013   Procedure: LEFT BREAST LUMPECTOMY WITH NEEDLE LOCALIZATION;  Surgeon: Stark Klein, MD;  Location: [facility];  Service: General;  Laterality: Left;  . BREAST SURGERY     Lumpectomy in april 2014  . HEMORRHOID SURGERY  04/2011   ligation  . MASTECTOMY Left 02/15/2017  . PORT-A-CATH REMOVAL N/A 04/16/2014   Procedure: REMOVAL PORT-A-CATH;  Surgeon: Stark Klein, MD;  Location: [facility];  Service: General;  Laterality: N/A;  . PORTACATH PLACEMENT Right 02/04/2013   Procedure: INSERTION PORT-A-CATH;  Surgeon: Stark Klein, MD;  Location: [facility];  Service: General;  Laterality: Right;  Start Time: 7847.  Marland Kitchen SHOULDER ARTHROSCOPY WITH ROTATOR CUFF REPAIR AND SUBACROMIAL DECOMPRESSION Left 02/24/2014   Procedure: SHOULDER ARTHROSCOPY WITH ROTATOR CUFF REPAIR AND SUBACROMIAL DECOMPRESSION;  Surgeon: Meredith Pel, MD;  Location: [facility];  Service: Orthopedics;  Laterality: Left;  LEFT SHOULDER DIAGNOSTIC OPERATIVE ARTHROSCOPY, SUBACROMIAL DECOMPRESSION, ROTATOR CUFF TEAR REPAIR  . SIMPLE MASTECTOMY WITH AXILLARY SENTINEL NODE BIOPSY Left 02/15/2017   Procedure: LEFT MASTECTOMY;  Surgeon: Stark Klein, MD;  Location[facility];  Service: General;  Laterality: Left;    Family History  Problem Relation Age of Onset  . Lung cancer  Father   . Hypertension Father   . Thyroid cancer Father        dx in his 39s  . Cancer Father        lung, thyroid, smoker  . Breast cancer Paternal Aunt 18  . Colon cancer Paternal Aunt        dx in her 50x  . Cervical cancer Paternal Aunt        dzx in her 19s  . Ovarian cancer Cousin        dx in her lage 14s  . Breast cancer Cousin         maternal first cousin, once removed; dx in her late 36s  . Breast cancer Cousin        maternal first cousin once removed; dx in late 68s  . Hypertension Mother   . Diabetes Mother   . Dementia Mother   . Hypertension Brother   . Seizures Brother        Alcohol induced.  . Alcohol abuse Brother        drinker, smoker  . Cancer Paternal Uncle        oral cancer  . Kidney cancer Paternal Grandmother   . Arthritis Daughter        back surgery  . Cancer Cousin        several paternal cousins with brain cancer, leukemia, and other cancers    Social History   Social History  . Marital status: Married    Spouse name: [name]  . Number of children: 1  . Years of education: 83   Occupational History  . [employer]   Social History Main Topics  . Smoking status: Never Smoker  . Smokeless tobacco: Never Used  . Alcohol use No  . Drug use: No  . Sexual activity: No     Comment: lives with husband, disability/retirement. [employer], no dietary restrictions   Other Topics Concern  . Not on file   Social History Narrative   Patient is married [name]) and lives at home with her husband.   Patient has one daughter.   Patient is working at [employer]   Patient has a 12th grade education.   Patient drinks very little caffeine.    Outpatient Medications Prior to Visit  Medication Sig Dispense Refill  . amitriptyline (ELAVIL) 100 MG tablet Take 100 mg by mouth at bedtime.  2  . anastrozole (ARIMIDEX) 1 MG tablet TAKE 1 TABLET BY MOUTH EVERY DAY (Patient taking differently: TAKE 1 TABLET (1 MG) BY MOUTH DAILY IN THE MORNING) 90 tablet 3  . Ascorbic Acid (VITAMIN C) 100 MG tablet Take 100 mg by mouth daily.    Marland Kitchen aspirin EC 81 MG tablet Take 81 mg by mouth daily at 3 pm.     . B Complex-C (SUPER B COMPLEX PO) Take 1 tablet by mouth daily.    . clonazePAM (KLONOPIN) 1 MG tablet Take 1 tablet (1 mg total) by mouth 3 (three) times daily. (Patient taking  differently: Take 1 mg by mouth 2 (two) times daily. ) 90 tablet 0  . Cyanocobalamin (VITAMIN B-12) 5000 MCG SUBL Place 5,000 mcg under the tongue daily.    Marland Kitchen gabapentin (NEURONTIN) 800 MG tablet TAKE 1 TABLET BY MOUTH 3 TIMES A DAY 90 tablet 3  . HYDROcodone-acetaminophen (NORCO/VICODIN) 5-325 MG tablet Take 1-2 tablets by mouth every 4 (four) hours as needed. For neuropathy/pain. 30 tablet 0  . hydrOXYzine (  ATARAX/VISTARIL) 10 MG tablet TAKE 1 TABLET BY MOUTH 3 TIMES A DAY AS NEEDED FOR ANXIETY 90 tablet 0  . KLOR-CON M20 20 MEQ tablet TAKE 2 TABLETS BY MOUTH EVERY DAY FOR 3 DAYS, THEN TAKE 1 TABLET EVERY DAY (Patient taking differently: TAKE 1 TABLET (20 MEQ) BY MOUTH DAILY IN THE MORNING.) 35 tablet 1  . Lurasidone HCl (LATUDA) 60 MG TABS Take 60 mg by mouth at bedtime.     . methocarbamol (ROBAXIN) 500 MG tablet Take 1 tablet (500 mg total) by mouth every 6 (six) hours as needed for muscle spasms. 10 tablet 2  . Multiple Vitamin (MULTIVITAMIN WITH MINERALS) TABS tablet Take 1 tablet by mouth daily.    . valsartan-hydrochlorothiazide (DIOVAN-HCT) 320-25 MG tablet TAKE 1 TABLET BY MOUTH EVERY MORNING 30 tablet 2  . diclofenac (VOLTAREN) 75 MG EC tablet TAKE 1 TABLET (75 MG TOTAL) BY MOUTH 2 (TWO) TIMES DAILY. (Patient not taking: Reported on 02/07/2017) 30 tablet 0   No facility-administered medications prior to visit.     Allergies  Allergen Reactions  . No Known Allergies     Review of Systems  Constitutional: Positive for malaise/fatigue. Negative for fever.  HENT: Negative for congestion.   Eyes: Negative for blurred vision.  Respiratory: Negative for cough and shortness of breath.   Cardiovascular: Negative for chest pain, palpitations and leg swelling.  Gastrointestinal: Negative for vomiting.  Musculoskeletal: Positive for joint pain. Negative for back pain.  Skin: Negative for rash.  Neurological: Positive for tingling. Negative for loss of consciousness and headaches.    Psychiatric/Behavioral: Positive for depression.       Objective:    Physical Exam  Constitutional: She is oriented to person, place, and time. She appears well-developed and well-nourished. No distress.  HENT:  Head: Normocephalic and atraumatic.  Eyes: Conjunctivae are normal.  Neck: Normal range of motion. No thyromegaly present.  Cardiovascular: Normal rate and regular rhythm.   Pulmonary/Chest: Effort normal and breath sounds normal. She has no wheezes.  Abdominal: Soft. Bowel sounds are normal. There is no tenderness.  Musculoskeletal: Normal range of motion. She exhibits no edema or deformity.  Neurological: She is alert and oriented to person, place, and time.  Skin: Skin is warm and dry. She is not diaphoretic.  Psychiatric: She has a normal mood and affect.    BP 98/72 (BP Location: Left Arm, Patient Position: Sitting, Cuff Size: Normal)   Pulse 91   Temp 98.1 F (36.7 C) (Oral)   Resp 18   Wt 184 lb 3.2 oz (83.6 kg)   LMP 01/20/2013   SpO2 98%   BMI 33.69 kg/m  Wt Readings from Last 3 Encounters:  05/14/17 184 lb 3.2 oz (83.6 kg)  02/15/17 183 lb 3.2 oz (83.1 kg)  02/14/17 183 lb 1.6 oz (83.1 kg)   BP Readings from Last 3 Encounters:  05/14/17 98/72  02/16/17 111/67  02/14/17 128/80      There is no immunization history on file for this patient.  Health Maintenance  Topic Date Due  . Hepatitis C Screening  05-31-1956  . HIV Screening  01/27/1970  . TETANUS/TDAP  01/27/1974  . INFLUENZA VACCINE  06/06/2017  . PAP SMEAR  05/16/2018  . MAMMOGRAM  02/02/2019  . COLONOSCOPY  11/06/2024    Lab Results  Component Value Date   WBC 8.5 02/16/2017   HGB 9.5 (L) 02/16/2017   HCT 29.2 (L) 02/16/2017   PLT 142 (L) 02/16/2017   GLUCOSE 146 (H)  02/16/2017   CHOL 180 11/13/2016   TRIG 71.0 11/13/2016   HDL 53.20 11/13/2016   LDLCALC 113 (H) 11/13/2016   ALT 26 02/14/2017   AST 38 02/14/2017   NA 139 02/16/2017   K 4.1 02/16/2017   CL 106 02/16/2017    CREATININE 0.94 02/16/2017   BUN 10 02/16/2017   CO2 26 02/16/2017   TSH 1.98 11/13/2016   INR 1.02 07/19/2013    Lab Results  Component Value Date   TSH 1.98 11/13/2016   Lab Results  Component Value Date   WBC 8.5 02/16/2017   HGB 9.5 (L) 02/16/2017   HCT 29.2 (L) 02/16/2017   MCV 85.1 02/16/2017   PLT 142 (L) 02/16/2017   Lab Results  Component Value Date   NA 139 02/16/2017   K 4.1 02/16/2017   CHLORIDE 104 01/22/2017   CO2 26 02/16/2017   GLUCOSE 146 (H) 02/16/2017   BUN 10 02/16/2017   CREATININE 0.94 02/16/2017   BILITOT 0.5 02/14/2017   ALKPHOS 68 02/14/2017   AST 38 02/14/2017   ALT 26 02/14/2017   PROT 7.3 02/14/2017   ALBUMIN 4.1 02/14/2017   CALCIUM 8.9 02/16/2017   ANIONGAP 7 02/16/2017   EGFR 67 (L) 01/22/2017   GFR 69.10 11/13/2016   Lab Results  Component Value Date   CHOL 180 11/13/2016   Lab Results  Component Value Date   HDL 53.20 11/13/2016   Lab Results  Component Value Date   LDLCALC 113 (H) 11/13/2016   Lab Results  Component Value Date   TRIG 71.0 11/13/2016   Lab Results  Component Value Date   CHOLHDL 3 11/13/2016   No results found for: HGBA1C       Assessment & Plan:   Problem List Items Addressed This Visit    Primary cancer of upper inner quadrant of left female breast [facility])    Following now only with Dr [name] annually and does not see Dr Barry Dienes at this time      Relevant Orders   CBC   Left shoulder pain with abduction    S/p rotator cuff repair and PT, continues to have diminished ROM secondary to pain in chest wall where breast was removed.       Anxiety and depression    Follows with psychiatry and manages well most days      Chemotherapy-induced peripheral neuropathy ([place])    Struggling with pain despite the Gabapentin 800 mg po tid but it is controlled enough for her to sleep. No changes at this time. May try Robaxin prn for severe pain      Relevant Orders   Comprehensive metabolic panel    Hyperglycemia     minimize simple carbs. Increase exercise as tolerated.       Relevant Orders   Hemoglobin A1c   Constipation    Encouraged increased hydration and fiber in diet. Daily probiotics. If bowels not moving can use MOM 2 tbls po in 4 oz of warm prune juice by mouth every 2-3 days. If no results then repeat in 4 hours with  Dulcolax suppository pr, may repeat again in 4 more hours as needed. Seek care if symptoms worsen. Consider daily Miralax and/or Dulcolax if symptoms persist.       Relevant Orders   TSH   CBC   Comprehensive metabolic panel   Anemia - Primary   Relevant Orders   CBC   Hyperlipidemia    Encouraged heart healthy diet, increase exercise, avoid  trans fats, consider a krill oil cap daily      Relevant Orders   Lipid panel      I have discontinued Ms. Narang's diclofenac. I am also having her maintain her B Complex-C (SUPER B COMPLEX PO), aspirin EC, clonazePAM, Lurasidone HCl, anastrozole, amitriptyline, KLOR-CON M20, vitamin C, multivitamin with minerals, Vitamin B-12, HYDROcodone-acetaminophen, methocarbamol, gabapentin, valsartan-hydrochlorothiazide, and hydrOXYzine.  No orders of the defined types were placed in this encounter.   CMA served as Education administrator during this visit. History, Physical and Plan performed by medical provider. Documentation and orders reviewed and attested to.  [name], MD

## 2017-05-14 NOTE — Assessment & Plan Note (Signed)
Encouraged heart healthy diet, increase exercise, avoid trans fats, consider a krill oil cap daily 

## 2017-05-14 NOTE — Assessment & Plan Note (Signed)
Encouraged increased hydration and fiber in diet. Daily probiotics. If bowels not moving can use MOM 2 tbls po in 4 oz of warm prune juice by mouth every 2-3 days. If no results then repeat in 4 hours with  Dulcolax suppository pr, may repeat again in 4 more hours as needed. Seek care if symptoms worsen. Consider daily Miralax and/or Dulcolax if symptoms persist.  

## 2017-05-14 NOTE — Assessment & Plan Note (Signed)
Follows with psychiatry and manages well most days

## 2017-05-14 NOTE — Assessment & Plan Note (Signed)
Struggling with pain despite the Gabapentin 800 mg po tid but it is controlled enough for her to sleep. No changes at this time. May try Robaxin prn for severe pain

## 2017-05-14 NOTE — Assessment & Plan Note (Signed)
minimize simple carbs. Increase exercise as tolerated.  

## 2017-05-14 NOTE — Assessment & Plan Note (Signed)
Following now only with Dr [name] annually and does not see Dr Barry Dienes at this time

## 2017-05-14 NOTE — Assessment & Plan Note (Signed)
S/p rotator cuff repair and PT, continues to have diminished ROM secondary to pain in chest wall where breast was removed.

## 2017-05-15 ENCOUNTER — Other Ambulatory Visit: Payer: Self-pay | Admitting: Family Medicine

## 2017-05-15 DIAGNOSIS — C50912 Malignant neoplasm of unspecified site of left female breast: Secondary | ICD-10-CM

## 2017-05-15 DIAGNOSIS — M25512 Pain in left shoulder: Secondary | ICD-10-CM

## 2017-05-15 DIAGNOSIS — G8929 Other chronic pain: Secondary | ICD-10-CM

## 2017-05-15 MED ORDER — HYDROCODONE-ACETAMINOPHEN 5-325 MG PO TABS: 1.0000 | ORAL_TABLET | Freq: Four times a day (QID) | ORAL | 0 refills | Status: DC | PRN

## 2017-05-16 ENCOUNTER — Encounter: Payer: Self-pay | Admitting: Hematology and Oncology

## 2017-05-16 ENCOUNTER — Ambulatory Visit (HOSPITAL_BASED_OUTPATIENT_CLINIC_OR_DEPARTMENT_OTHER): Payer: BC Managed Care – PPO | Admitting: Hematology and Oncology

## 2017-05-16 DIAGNOSIS — Z79811 Long term (current) use of aromatase inhibitors: Secondary | ICD-10-CM

## 2017-05-16 DIAGNOSIS — C50212 Malignant neoplasm of upper-inner quadrant of left female breast: Secondary | ICD-10-CM | POA: Diagnosis not present

## 2017-05-16 DIAGNOSIS — M791 Myalgia: Secondary | ICD-10-CM | POA: Diagnosis not present

## 2017-05-16 DIAGNOSIS — M25561 Pain in right knee: Secondary | ICD-10-CM | POA: Diagnosis not present

## 2017-05-16 DIAGNOSIS — F329 Major depressive disorder, single episode, unspecified: Secondary | ICD-10-CM

## 2017-05-16 DIAGNOSIS — I890 Lymphedema, not elsewhere classified: Secondary | ICD-10-CM

## 2017-05-16 DIAGNOSIS — Z170 Estrogen receptor positive status [ER+]: Secondary | ICD-10-CM | POA: Diagnosis not present

## 2017-05-16 DIAGNOSIS — G620 Drug-induced polyneuropathy: Secondary | ICD-10-CM | POA: Diagnosis not present

## 2017-05-16 DIAGNOSIS — M25562 Pain in left knee: Secondary | ICD-10-CM

## 2017-05-16 MED ORDER — ANASTROZOLE 1 MG PO TABS: ORAL_TABLET | ORAL | 3 refills | Status: DC

## 2017-05-16 NOTE — Assessment & Plan Note (Signed)
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 followed by Taxol carboplatin later switched to carbo Gemzar completed 08/21/2013, status post adjuvant radiation, currently on tamoxifen started 11/13/2013 Switched to anastrozole 03/10/2016   Left breast lymphedema: received physical therapy Neuropathyrelated to prior chemotherapy: Patient is currently on Neurontin and her symptoms are not improving.  Major depression: Patient is seeing psychiatry. Since she stopped tamoxifen, Her depression symptoms had markedly improved.   Anastrozole toxicities: 1. Musculoskeletal aches and pains especially in the knees: Patient is Trying to do more activity to help alleviate the symptoms. 2. Denies any major problems with depression. Denies any hot flashes.  Breast Cancer Surveillance: 1. Breast exam 05/16/2017: Normal 2. Mammogram 02/01/2017 No abnormalities. Postsurgical changes. Breast Density Category B.   Return to clinic in 1 year for follow-up. 

## 2017-05-16 NOTE — Progress Notes (Signed)
Patient Care Team: Mosie Lukes, MD as PCP - General (Family Medicine) Nicholas Lose, MD as Consulting Physician (Hematology and Oncology) Lin Landsman, MD as Consulting Physician (Family Medicine) Inocencio Homes, DPM as Consulting Physician (Podiatry) Gery Pray, MD as Consulting Physician (Radiation Oncology) Dan Humphreys as Consulting Physician (Nurse Practitioner)  DIAGNOSIS:  Encounter Diagnosis  Name Primary?  . Primary cancer of upper inner quadrant of left female breast ([place])     SUMMARY OF ONCOLOGIC HISTORY:   Primary cancer of upper inner quadrant of left female breast ([place])   01/24/2013 Initial Diagnosis    Ultrasound: Left breast no position 1 x 0.7 cm, 12:00 position 1.6 x 0.9 cm in addition 2.1 cm axillary lymph node: Biopsies of breast and axilla were positive for IDC high-grade;  ER weakly positive, PR negative, HER-2 negative      02/04/2013 Surgery    Left breast lumpectomy: 1.6 cm tumor 1/19 axillary lymph nodes positive, ER 6%, PR 0%, HER-2 negative, Ki-67 90%      03/05/2013 - 08/21/2013 Chemotherapy    Adjuvant chemotherapy with Adriamycin and Cytoxan followed by Taxol and [place] x9 followed by [place]vis [place] x2      09/09/2013 - 10/28/2013 Radiation Therapy    Adjuvant radiation therapy      11/13/2013 -  Anti-estrogen oral therapy    Tamoxifen 20 mg daily switched to anastrozole May 2017       CHIEF COMPLIANT: Follow-up on anastrozole therapy, continues to have problems with depression  INTERVAL HISTORY: Kristi [name] is a 61 year old with above-mentioned history of found left breast cancer currently on adjuvant anastrozole therapy. She appears to be tolerating it fairly well. She does complain of moderate degree of myalgias and arthralgias. She continues to have problems with prior chemotherapy-induced neuropathy. She continues to have depression.  REVIEW OF SYSTEMS:   Constitutional: Denies fevers, chills or abnormal weight loss Eyes: Denies  blurriness of vision Ears, nose, mouth, throat, and face: Denies mucositis or sore throat Respiratory: Denies cough, dyspnea or wheezes Cardiovascular: Denies palpitation, chest discomfort Gastrointestinal:  Denies nausea, heartburn or change in bowel habits Skin: Denies abnormal skin rashes Lymphatics: Denies new lymphadenopathy or easy bruising Neurological:Denies numbness, tingling or new weaknesses Behavioral/Psych: Mood is stable, no new changes  Extremities: No lower extremity edema Breast:  denies any pain or lumps or nodules in either breasts All other systems were reviewed with the patient and are negative.  I have reviewed the past medical history, past surgical history, social history and family history with the patient and they are unchanged from previous note.  ALLERGIES:  is allergic to no known allergies.  MEDICATIONS:  Current Outpatient Prescriptions  Medication Sig Dispense Refill  . amitriptyline (ELAVIL) 100 MG tablet Take 100 mg by mouth at bedtime.  2  . anastrozole (ARIMIDEX) 1 MG tablet TAKE 1 TABLET BY MOUTH EVERY DAY (Patient taking differently: TAKE 1 TABLET (1 MG) BY MOUTH DAILY IN THE MORNING) 90 tablet 3  . Ascorbic Acid (VITAMIN C) 100 MG tablet Take 100 mg by mouth daily.    Marland Kitchen aspirin EC 81 MG tablet Take 81 mg by mouth daily at 3 pm.     . B Complex-C (SUPER B COMPLEX PO) Take 1 tablet by mouth daily.    . clonazePAM (KLONOPIN) 1 MG tablet Take 1 tablet (1 mg total) by mouth 3 (three) times daily. (Patient taking differently: Take 1 mg by mouth 2 (two) times daily. ) 90 tablet 0  .  Cyanocobalamin (VITAMIN B-12) 5000 MCG SUBL Place 5,000 mcg under the tongue daily.    Marland Kitchen gabapentin (NEURONTIN) 800 MG tablet TAKE 1 TABLET BY MOUTH 3 TIMES A DAY 90 tablet 3  . HYDROcodone-acetaminophen (NORCO/VICODIN) 5-325 MG tablet Take 1-2 tablets by mouth every 6 (six) hours as needed. For neuropathy/pain. 30 tablet 0  . hydrOXYzine (ATARAX/VISTARIL) 10 MG tablet TAKE 1  TABLET BY MOUTH 3 TIMES A DAY AS NEEDED FOR ANXIETY 90 tablet 0  . KLOR-CON M20 20 MEQ tablet TAKE 2 TABLETS BY MOUTH EVERY DAY FOR 3 DAYS, THEN TAKE 1 TABLET EVERY DAY (Patient taking differently: TAKE 1 TABLET (20 MEQ) BY MOUTH DAILY IN THE MORNING.) 35 tablet 1  . Lurasidone HCl (LATUDA) 60 MG TABS Take 60 mg by mouth at bedtime.     . methocarbamol (ROBAXIN) 500 MG tablet Take 1 tablet (500 mg total) by mouth every 6 (six) hours as needed for muscle spasms. 10 tablet 2  . Multiple Vitamin (MULTIVITAMIN WITH MINERALS) TABS tablet Take 1 tablet by mouth daily.    . valsartan-hydrochlorothiazide (DIOVAN-HCT) 320-25 MG tablet TAKE 1 TABLET BY MOUTH EVERY MORNING 30 tablet 2   No current facility-administered medications for this visit.     PHYSICAL EXAMINATION: ECOG PERFORMANCE STATUS: 1 - Symptomatic but completely ambulatory  Vitals:   05/16/17 1432  BP: 123/70  Pulse: 94  Resp: 20  Temp: 98.2 F (36.8 C)   Filed Weights   05/16/17 1432  Weight: 184 lb 1.6 oz (83.5 kg)    GENERAL:alert, no distress and comfortable SKIN: skin color, texture, turgor are normal, no rashes or significant lesions EYES: normal, Conjunctiva are pink and non-injected, sclera clear OROPHARYNX:no exudate, no erythema and lips, buccal mucosa, and tongue normal  NECK: supple, thyroid normal size, non-tender, without nodularity LYMPH:  no palpable lymphadenopathy in the cervical, axillary or inguinal LUNGS: clear to auscultation and percussion with normal breathing effort HEART: regular rate & rhythm and no murmurs and no lower extremity edema ABDOMEN:abdomen soft, non-tender and normal bowel sounds MUSCULOSKELETAL:no cyanosis of digits and no clubbing  NEURO: alert & oriented x 3 with fluent speech, no focal motor/sensory deficits EXTREMITIES: No lower extremity edema  LABORATORY DATA:  I have reviewed the data as listed   Chemistry      Component Value Date/Time   NA 138 05/14/2017 1101   NA 143  01/22/2017 1030   K 4.2 05/14/2017 1101   K 3.4 (L) 01/22/2017 1030   CL 101 05/14/2017 1101   CL 100 04/23/2013 1447   CO2 32 05/14/2017 1101   CO2 29 01/22/2017 1030   BUN 15 05/14/2017 1101   BUN 11.5 01/22/2017 1030   CREATININE 1.09 05/14/2017 1101   CREATININE 1.0 01/22/2017 1030      Component Value Date/Time   CALCIUM 10.1 05/14/2017 1101   CALCIUM 10.2 01/22/2017 1030   ALKPHOS 76 05/14/2017 1101   ALKPHOS 81 01/22/2017 1030   AST 19 05/14/2017 1101   AST 22 01/22/2017 1030   ALT 14 05/14/2017 1101   ALT 22 01/22/2017 1030   BILITOT 0.3 05/14/2017 1101   BILITOT 0.36 01/22/2017 1030       Lab Results  Component Value Date   WBC 5.5 05/14/2017   HGB 11.3 (L) 05/14/2017   HCT 34.0 (L) 05/14/2017   MCV 86.0 05/14/2017   PLT 162.0 05/14/2017   NEUTROABS 2.4 01/22/2017    ASSESSMENT & PLAN:  Primary cancer of upper inner quadrant of left  female breast Multifocal invasive ductal carcinoma grade 3 ER weakly positive at 3% PR negative HER-2/neu negative with Ki-67 of 90% status post lumpectomy 02/04/2013 1.6 and the tumor one out of 19 lymph nodes positive status post adjuvant a.c. followed by Taxol carboplatin later switched to [place] completed 08/21/2013, status post adjuvant radiation, currently on tamoxifen started 11/13/2013 Switched to anastrozole 03/10/2016   Left breast lymphedema: received physical therapy Neuropathyrelated to prior chemotherapy: Patient is currently on Neurontin and her symptoms are not improving.  Major depression: Patient is seeing psychiatry. Since she stopped tamoxifen, Her depression symptoms had markedly improved.   Anastrozole toxicities: 1. Musculoskeletal aches and pains especially in the knees: Patient is Trying to do more activity to help alleviate the symptoms. 2. Denies any major problems with depression. Denies any hot flashes.  Breast Cancer Surveillance: 1. Breast exam 05/16/2017: Normal 2. Mammogram  02/01/2017 No abnormalities. Postsurgical changes. Breast Density Category B.   Return to clinic in 1 year for follow-up.   I spent 25 minutes talking to the patient of which more than half was spent in counseling and coordination of care.  No orders of the defined types were placed in this encounter.  The patient has a good understanding of the overall plan. she agrees with it. she will call with any problems that may develop before the next visit here.   Rulon Eisenmenger, MD 05/16/17

## 2017-05-24 ENCOUNTER — Telehealth: Payer: Self-pay | Admitting: *Deleted

## 2017-05-24 NOTE — Telephone Encounter (Signed)
:"  I left forms for my insurance company.  Received call from company that they have not received forms yet."  Form is in [employer] For completion.  Forms take seven to ten business days.  Staff reports form completion   Expected on 05-31-2017.  Form received 05-17-2017.  Provided this information to patient.

## 2017-06-07 ENCOUNTER — Other Ambulatory Visit: Payer: Self-pay | Admitting: Family Medicine

## 2017-06-12 ENCOUNTER — Other Ambulatory Visit: Payer: Self-pay | Admitting: Family Medicine

## 2017-06-15 ENCOUNTER — Other Ambulatory Visit: Payer: Self-pay | Admitting: Family Medicine

## 2017-06-21 ENCOUNTER — Encounter: Payer: Self-pay | Admitting: Family Medicine

## 2017-06-21 MED ORDER — HYDROCODONE-ACETAMINOPHEN 5-325 MG PO TABS: 1.0000 | ORAL_TABLET | Freq: Four times a day (QID) | ORAL | 0 refills | Status: DC | PRN

## 2017-06-22 ENCOUNTER — Other Ambulatory Visit: Payer: Self-pay | Admitting: Family Medicine

## 2017-06-22 ENCOUNTER — Encounter: Payer: Self-pay | Admitting: Family Medicine

## 2017-06-22 DIAGNOSIS — Z79899 Other long term (current) drug therapy: Secondary | ICD-10-CM | POA: Diagnosis not present

## 2017-06-22 DIAGNOSIS — Z79891 Long term (current) use of opiate analgesic: Secondary | ICD-10-CM | POA: Diagnosis not present

## 2017-06-22 NOTE — Telephone Encounter (Signed)
Faxed hardcopy for Clonazepam to [facility] in [place]

## 2017-06-22 NOTE — Telephone Encounter (Signed)
Last office visit on 05/14/2017---no future appt made Last refill on 03/21/2016  #90 no refills. No Contract and no UDS

## 2017-06-24 ENCOUNTER — Other Ambulatory Visit: Payer: Self-pay | Admitting: Hematology and Oncology

## 2017-06-24 DIAGNOSIS — C50212 Malignant neoplasm of upper-inner quadrant of left female breast: Secondary | ICD-10-CM

## 2017-08-08 ENCOUNTER — Other Ambulatory Visit: Payer: Self-pay | Admitting: Family Medicine

## 2017-08-09 ENCOUNTER — Ambulatory Visit (INDEPENDENT_AMBULATORY_CARE_PROVIDER_SITE_OTHER): Payer: BC Managed Care – PPO | Admitting: Medical

## 2017-08-09 ENCOUNTER — Telehealth: Payer: Self-pay | Admitting: Medical

## 2017-08-09 ENCOUNTER — Ambulatory Visit (HOSPITAL_BASED_OUTPATIENT_CLINIC_OR_DEPARTMENT_OTHER)
Admission: RE | Admit: 2017-08-09 | Discharge: 2017-08-09 | Disposition: A | Discharge status: Home / Self Care | Payer: BC Managed Care – PPO | Source: Ambulatory Visit | Attending: Medical | Admitting: Medical

## 2017-08-09 ENCOUNTER — Encounter: Payer: Self-pay | Admitting: Medical

## 2017-08-09 VITALS — BP 107/63 | HR 84 | Temp 98.4°F | Resp 16 | Ht 62.0 in | Wt 182.6 lb

## 2017-08-09 DIAGNOSIS — K5900 Constipation, unspecified: Secondary | ICD-10-CM | POA: Diagnosis not present

## 2017-08-09 MED ORDER — HYDROCODONE-ACETAMINOPHEN 5-325 MG PO TABS: 1.0000 | ORAL_TABLET | Freq: Four times a day (QID) | ORAL | 0 refills | Status: DC | PRN

## 2017-08-09 MED ORDER — LINACLOTIDE 145 MCG PO CAPS: 145.0000 ug | ORAL_CAPSULE | Freq: Every day | ORAL | 0 refills | Status: DC

## 2017-08-09 NOTE — Telephone Encounter (Signed)
Prescription of Linzess sent in for chronic constipation

## 2017-08-09 NOTE — Progress Notes (Signed)
Subjective:    Patient ID: Kristi [name], female    DOB: Jun 15, 1956, 61 y.o.   MRN: [id number]  HPI  Pt in with some recent constipation.   Pt states she for past 2 months she has intermittent constipation. She takes dulcolax about every 2 days. Her last bowel movement was on Monday. Pt had appendectomy years ago but no other surgeries. No hx of bowel obstruction.   Pt feels like she keeps herself hydrated. Pt states very little processed carbohydrates. She does not drink any coffee. Very little exercise.   Pt when younger states bm was about one every day.  Pt is on hydrocodone. She estimates taking it one time a day.   Pt wonders if she can take linzess. No hx of ibs-c.    Review of Systems  Constitutional: Negative for chills, fatigue and fever.  Respiratory: Negative for cough, chest tightness, shortness of breath and wheezing.   Cardiovascular: Negative for chest pain and palpitations.  Gastrointestinal: Positive for constipation.  Musculoskeletal: Negative for back pain.  Skin: Negative for rash.  Neurological: Negative for dizziness, seizures, syncope, weakness, numbness and headaches.  Hematological: Negative for adenopathy. Does not bruise/bleed easily.  Psychiatric/Behavioral: Negative for behavioral problems, confusion and sleep disturbance. The patient is not nervous/anxious.     Past Medical History:  Diagnosis Date  . Anemia    Iron deficinecy anemia  . Anemia 05/14/2017  . Anxiety   . Anxiety and depression 05/15/2014  . Arthritis   . Breast cancer ([place])    left ,last radiation 2'15, last chemo 8'14  . Constipation 11/13/2016  . Depression   . History of chicken pox   . History of radiation therapy 09/09/13-10/28/13   45 gray to left breast, lumpectomy cavity boosted to 63 gray  . HTN (hypertension) 11/13/2016  . Hyperglycemia 01/09/2016  . Hyperlipidemia 05/14/2017  . Hypertension   . MRSA (methicillin resistant Staphylococcus aureus) 2009   right groin  area-no issues now. 04-07-14 PCR screen negative today.  . Neuropathy   . Preventative health care 11/13/2016     Social History   Social History  . Marital status: Married    Spouse name: [name]  . Number of children: 1  . Years of education: 27   Occupational History  . [employer]   Social History Main Topics  . Smoking status: Never Smoker  . Smokeless tobacco: Never Used  . Alcohol use No  . Drug use: No  . Sexual activity: No     Comment: lives with husband, disability/retirement. [employer], no dietary restrictions   Other Topics Concern  . Not on file   Social History Narrative   Patient is married [name]) and lives at home with her husband.   Patient has one daughter.   Patient is working at [employer]   Patient has a 12th grade education.   Patient drinks very little caffeine.    Past Surgical History:  Procedure Laterality Date  . ANAL SPHINCTEROTOMY  04/2011  . APPENDECTOMY  1980  . AXILLARY LYMPH NODE DISSECTION Left 02/04/2013   Procedure: LEFT AXILLARY LYMPH NODE DISSECTION;  Surgeon: Stark Klein, MD;  Location: [facility];  Service: General;  Laterality: Left;  End: 7169  . BREAST LUMPECTOMY WITH NEEDLE LOCALIZATION Left 02/04/2013   Procedure: LEFT BREAST LUMPECTOMY WITH NEEDLE LOCALIZATION;  Surgeon: Stark Klein, MD;  Location: [facility];  Service: General;  Laterality: Left;  . BREAST SURGERY  Lumpectomy in april 2014  . HEMORRHOID SURGERY  04/2011   ligation  . MASTECTOMY Left 02/15/2017  . PORT-A-CATH REMOVAL N/A 04/16/2014   Procedure: REMOVAL PORT-A-CATH;  Surgeon: Stark Klein, MD;  Location: [facility];  Service: General;  Laterality: N/A;  . PORTACATH PLACEMENT Right 02/04/2013   Procedure: INSERTION PORT-A-CATH;  Surgeon: Stark Klein, MD;  Location: [facility];  Service: General;  Laterality: Right;  Start Time: 1937.  Marland Kitchen SHOULDER ARTHROSCOPY WITH ROTATOR CUFF REPAIR AND SUBACROMIAL DECOMPRESSION Left 02/24/2014   Procedure:  SHOULDER ARTHROSCOPY WITH ROTATOR CUFF REPAIR AND SUBACROMIAL DECOMPRESSION;  Surgeon: Meredith Pel, MD;  Location: [facility];  Service: Orthopedics;  Laterality: Left;  LEFT SHOULDER DIAGNOSTIC OPERATIVE ARTHROSCOPY, SUBACROMIAL DECOMPRESSION, ROTATOR CUFF TEAR REPAIR  . SIMPLE MASTECTOMY WITH AXILLARY SENTINEL NODE BIOPSY Left 02/15/2017   Procedure: LEFT MASTECTOMY;  Surgeon: Stark Klein, MD;  Location[facility];  Service: General;  Laterality: Left;    Family History  Problem Relation Age of Onset  . Lung cancer Father   . Hypertension Father   . Thyroid cancer Father        dx in his 55s  . Cancer Father        lung, thyroid, smoker  . Breast cancer Paternal Aunt 38  . Colon cancer Paternal Aunt        dx in her 50x  . Cervical cancer Paternal Aunt        dzx in her 22s  . Ovarian cancer Cousin        dx in her lage 79s  . Breast cancer Cousin        maternal first cousin, once removed; dx in her late 70s  . Breast cancer Cousin        maternal first cousin once removed; dx in late 4s  . Hypertension Mother   . Diabetes Mother   . Dementia Mother   . Hypertension Brother   . Seizures Brother        Alcohol induced.  . Alcohol abuse Brother        drinker, smoker  . Cancer Paternal Uncle        oral cancer  . Kidney cancer Paternal Grandmother   . Arthritis Daughter        back surgery  . Cancer Cousin        several paternal cousins with brain cancer, leukemia, and other cancers    Allergies  Allergen Reactions  . No Known Allergies     Current Outpatient Prescriptions on File Prior to Visit  Medication Sig Dispense Refill  . amitriptyline (ELAVIL) 100 MG tablet Take 100 mg by mouth at bedtime.  2  . anastrozole (ARIMIDEX) 1 MG tablet TAKE 1 TABLET (1 MG) BY MOUTH DAILY IN THE MORNING 90 tablet 3  . Ascorbic Acid (VITAMIN C) 100 MG tablet Take 100 mg by mouth daily.    Marland Kitchen aspirin EC 81 MG tablet Take 81 mg by mouth daily at 3 pm.     . B Complex-C (SUPER B  COMPLEX PO) Take 1 tablet by mouth daily.    . clonazePAM (KLONOPIN) 1 MG tablet TAKE 1 TABLET BY MOUTH EVERY MORNING AND 1/2 TABLET AT BEDTIME 45 tablet 1  . Cyanocobalamin (VITAMIN B-12) 5000 MCG SUBL Place 5,000 mcg under the tongue daily.    Marland Kitchen gabapentin (NEURONTIN) 800 MG tablet TAKE 1 TABLET BY MOUTH 3 TIMES A DAY 90 tablet 3  . HYDROcodone-acetaminophen (NORCO/VICODIN) 5-325 MG tablet Take 1-2  tablets by mouth every 6 (six) hours as needed. For neuropathy/pain. 30 tablet 0  . hydrOXYzine (ATARAX/VISTARIL) 10 MG tablet TAKE 1 TABLET BY MOUTH 3 TIMES A DAY AS NEEDED FOR ANXIETY 90 tablet 0  . methocarbamol (ROBAXIN) 500 MG tablet Take 1 tablet (500 mg total) by mouth every 6 (six) hours as needed for muscle spasms. 10 tablet 2  . Multiple Vitamin (MULTIVITAMIN WITH MINERALS) TABS tablet Take 1 tablet by mouth daily.    . potassium chloride SA (KLOR-CON M20) 20 MEQ tablet Take 1 tablet (20 mEq total) by mouth daily. 90 tablet 1  . valsartan-hydrochlorothiazide (DIOVAN-HCT) 320-25 MG tablet TAKE 1 TABLET BY MOUTH EVERY DAY IN THE MORNING 30 tablet 2  . [DISCONTINUED] prochlorperazine (COMPAZINE) 10 MG tablet Take 1 tablet (10 mg total) by mouth every 6 (six) hours as needed (Nausea or vomiting). 30 tablet 1  . [DISCONTINUED] prochlorperazine (COMPAZINE) 25 MG suppository Place 1 suppository (25 mg total) rectally every 12 (twelve) hours as needed for nausea. 12 suppository 3   No current facility-administered medications on file prior to visit.     BP 107/63   Pulse 84   Temp 98.4 F (36.9 C) (Oral)   Resp 16   Ht 5\' 2"  (1.575 m)   Wt 182 lb 9.6 oz (82.8 kg)   LMP 01/20/2013   SpO2 98%   BMI 33.40 kg/m       Objective:   Physical Exam   General Appearance- Not in acute distress.  HEENT Eyes- Scleraeral/Conjuntiva-bilat- Not Yellow. Mouth & Throat- Normal.  Chest and Lung Exam Auscultation: Breath sounds:-Normal. Adventitious sounds:- No Adventitious  sounds.  Cardiovascular Auscultation:Rythm - Regular. Heart Sounds -Normal heart sounds.  Abdomen Inspection:-Inspection Normal.  Palpation/Perucssion: Palpation and Percussion of the abdomen reveal- Non Tender(mild bloated), No Rebound tenderness, No rigidity(Guarding) and No Palpable abdominal masses.  Liver:-Normal.  Spleen:- Normal.   Back- no CVA tenderness     Assessment & Plan:  Home -[id number].  For your recent constipation, I do want to get one view abdomen x-ray since it has been 4 days since her last bowel movement. I will call you later and let you know the x-ray results and let you know which medication I want you to take. I may advise magnesium citrate after reviewing x-ray.  Overall for your chronic intermittent constipation I would recommend trying to get some daily exercise and if you can try to reduce your use of Norco. Maybe try half a tablet on days your pain is well controlled.  After reviewing the x-ray I am also considering prescription of Linzess chronic constipation.  Refilled patient's Norco since it appears that she was due. She saw me today rather than her PCP.(On chart review appears patient has some arthralgias and neuropathy.)  Follow-up date to be determined after xray review.

## 2017-08-09 NOTE — Patient Instructions (Signed)
For your recent constipation, I do want to get one view abdomen x-ray since it has been 4 days since her last bowel movement. I will call you later and let you know the x-ray results and let you know which medication I want you to take. I may advise magnesium citrate after reviewing x-ray.  Overall for your chronic intermittent constipation I would recommend trying to get some daily exercise and if you can try to reduce your use of Norco. Maybe try half a tablet on days your pain is well controlled.  After reviewing the x-ray I am also considering prescription of Linzess chronic constipation.  Follow-up date to be determined after xray review.

## 2017-08-25 ENCOUNTER — Other Ambulatory Visit: Payer: Self-pay | Admitting: Hematology and Oncology

## 2017-08-25 DIAGNOSIS — C50212 Malignant neoplasm of upper-inner quadrant of left female breast: Secondary | ICD-10-CM

## 2017-09-07 ENCOUNTER — Other Ambulatory Visit: Payer: Self-pay

## 2017-09-07 MED ORDER — VALSARTAN-HYDROCHLOROTHIAZIDE 320-25 MG PO TABS: ORAL_TABLET | ORAL | 2 refills | Status: DC

## 2017-11-03 ENCOUNTER — Other Ambulatory Visit: Payer: Self-pay | Admitting: Family Medicine

## 2017-11-20 ENCOUNTER — Other Ambulatory Visit: Payer: Self-pay | Admitting: Hematology and Oncology

## 2017-11-20 DIAGNOSIS — C50212 Malignant neoplasm of upper-inner quadrant of left female breast: Secondary | ICD-10-CM

## 2017-12-13 ENCOUNTER — Ambulatory Visit (INDEPENDENT_AMBULATORY_CARE_PROVIDER_SITE_OTHER): Payer: BC Managed Care – PPO | Admitting: Family Medicine

## 2017-12-13 ENCOUNTER — Encounter: Payer: Self-pay | Admitting: Family Medicine

## 2017-12-13 ENCOUNTER — Ambulatory Visit: Payer: Self-pay | Admitting: Family Medicine

## 2017-12-13 VITALS — BP 108/70 | HR 90 | Temp 98.5°F | Ht 62.0 in | Wt 186.2 lb

## 2017-12-13 DIAGNOSIS — M792 Neuralgia and neuritis, unspecified: Secondary | ICD-10-CM | POA: Diagnosis not present

## 2017-12-13 MED ORDER — VENLAFAXINE HCL ER 37.5 MG PO CP24: 37.5000 mg | ORAL_CAPSULE | Freq: Every day | ORAL | 1 refills | Status: DC

## 2017-12-13 NOTE — Progress Notes (Signed)
Musculoskeletal Exam  Patient: Kristi [name] DOB: [birth year]  DOS: 12/13/2017  SUBJECTIVE:  Chief Complaint:   Chief Complaint  Patient presents with  . Foot Pain    Kristi [name] is a 62 y.o.  female for evaluation and treatment of b/l foot pain.   Onset: Several mo since receiving chemotherapy.  It got acutely worse over the past 2 weeks.  No injury or change in activity Location: Both feet up to ankles Character:  burning  Progression of issue:  is moderately worse Associated symptoms: None, sensation is still intact Treatment: to date has been-amitriptyline 100 mg daily, gabapentin 900 mg 3 times daily.    ROS: Musculoskeletal/Extremities: +b/l foot pain  Past Medical History:  Diagnosis Date  . Anemia    Iron deficinecy anemia  . Anemia 05/14/2017  . Anxiety   . Anxiety and depression 05/15/2014  . Arthritis   . Breast cancer ([place])    left ,last radiation 2'15, last chemo 8'14  . Constipation 11/13/2016  . Depression   . History of chicken pox   . History of radiation therapy 09/09/13-10/28/13   45 gray to left breast, lumpectomy cavity boosted to 63 gray  . HTN (hypertension) 11/13/2016  . Hyperglycemia 01/09/2016  . Hyperlipidemia 05/14/2017  . Hypertension   . MRSA (methicillin resistant Staphylococcus aureus) 2009   right groin area-no issues now. 04-07-14 PCR screen negative today.  . Neuropathy   . Preventative health care 11/13/2016    Objective: VITAL SIGNS: BP 108/70 (BP Location: Right Leg, Patient Position: Sitting, Cuff Size: Normal)   Pulse 90   Temp 98.5 F (36.9 C) (Oral)   Ht 5\' 2"  (1.575 m)   Wt 186 lb 4 oz (84.5 kg)   LMP 01/20/2013   SpO2 95%   BMI 34.07 kg/m  Constitutional: Well formed, well developed. No acute distress. Cardiovascular: Brisk cap refill Thorax & Lungs: No accessory muscle use Musculoskeletal: b/l feet.   Normal active range of motion: yes.   Normal passive range of motion: yes Tenderness to palpation: no Deformity:  no Ecchymosis: no Neurologic: Normal sensory function. No focal deficits noted.  Psychiatric: Normal mood. Age appropriate judgment and insight. Alert & oriented x 3.    Assessment:  Neuropathic pain - Plan: venlafaxine XR (EFFEXOR-XR) 37.5 MG 24 hr capsule  Plan: Orders as above.  Start low-dose of venlafaxine.  She is on amitriptyline.  If this is not helpful, would consider adding tramadol for venlafaxine. F/u in 2 weeks with Dr. Charlett Blake. The patient voiced understanding and agreement to the plan.   [place], DO 12/13/17  4:40 PM

## 2017-12-13 NOTE — Patient Instructions (Addendum)
Wear supportive shoes.   GI upset is the most common side effect of this new medicine, but not typically at this dose.    Let us know if you need anything.

## 2017-12-13 NOTE — Progress Notes (Signed)
Pre visit review using our clinic review tool, if applicable. No additional management support is needed unless otherwise documented below in the visit note. 

## 2017-12-27 ENCOUNTER — Ambulatory Visit (INDEPENDENT_AMBULATORY_CARE_PROVIDER_SITE_OTHER): Payer: BC Managed Care – PPO | Admitting: Family Medicine

## 2017-12-27 ENCOUNTER — Encounter: Payer: Self-pay | Admitting: Family Medicine

## 2017-12-27 DIAGNOSIS — C50212 Malignant neoplasm of upper-inner quadrant of left female breast: Secondary | ICD-10-CM | POA: Diagnosis not present

## 2017-12-27 DIAGNOSIS — M792 Neuralgia and neuritis, unspecified: Secondary | ICD-10-CM

## 2017-12-27 MED ORDER — VENLAFAXINE HCL ER 75 MG PO CP24: 75.0000 mg | ORAL_CAPSULE | Freq: Every day | ORAL | 1 refills | Status: DC

## 2017-12-27 MED ORDER — VALSARTAN-HYDROCHLOROTHIAZIDE 320-25 MG PO TABS: ORAL_TABLET | ORAL | 1 refills | Status: DC

## 2017-12-27 NOTE — Patient Instructions (Signed)
Take gabapentin at night only.  Take 2 caps of the venlafaxine until you run out. A new dose has been called in. You will only need to take 1 cap of the new dose on a daily basis.  Send me a MyChart message in 2 weeks if things aren't fully better.  Let us know if you need anything.

## 2017-12-27 NOTE — Progress Notes (Signed)
Chief Complaint  Patient presents with  . Follow-up    neuropathy    Subjective: Patient is a 62 y.o. female here for f/u neuropathy.  I saw her 2 weeks ago and started her on venlafaxine 37.5 mg/d. She reports about 50% improvement. No AE's, reports compliance. She continues to take gabapentin TID, but notices no difference on that. Pain no only affects balls of feet and toes, improved from feet up to ankles. Only at night. Denies weakness.   ROS: Neuro: As noted in HPI   Past Medical History:  Diagnosis Date  . Anemia    Iron deficinecy anemia  . Anemia 05/14/2017  . Anxiety   . Anxiety and depression 05/15/2014  . Arthritis   . Breast cancer ([place])    left ,last radiation 2'15, last chemo 8'14  . Constipation 11/13/2016  . Depression   . History of chicken pox   . History of radiation therapy 09/09/13-10/28/13   45 gray to left breast, lumpectomy cavity boosted to 63 gray  . HTN (hypertension) 11/13/2016  . Hyperglycemia 01/09/2016  . Hyperlipidemia 05/14/2017  . Hypertension   . MRSA (methicillin resistant Staphylococcus aureus) 2009   right groin area-no issues now. 04-07-14 PCR screen negative today.  . Neuropathy   . Preventative health care 11/13/2016   Objective: BP 112/72 (BP Location: Left Arm, Patient Position: Sitting, Cuff Size: Normal)   Pulse 84   Temp 98.3 F (36.8 C) (Oral)   Ht 5\' 2"  (1.575 m)   Wt 186 lb 6 oz (84.5 kg)   LMP 01/20/2013   SpO2 96%   BMI 34.09 kg/m  General: Awake, appears stated age Lungs: No accessory muscle use Psych: Age appropriate judgment and insight, normal affect and mood  Assessment and Plan: Neuropathic pain - Plan: venlafaxine XR (EFFEXOR-XR) 75 MG 24 hr capsule  Primary cancer of upper inner quadrant of left female breast ([place]) - Plan: gabapentin (NEURONTIN) 800 MG tablet  Orders as above. Increase dose of venlafaxine from 37.5 mg/d to 75 mg/d. Stop AM and afternoon gabapentin dose. If no better in 2 weeks, call and we will  increase the nighttime dose of gabapentin.  F/u in 6 weeks with Dr. Charlett Blake to f/u. The patient voiced understanding and agreement to the plan.  [place], DO 12/27/17  5:13 PM

## 2017-12-27 NOTE — Progress Notes (Signed)
Pre visit review using our clinic review tool, if applicable. No additional management support is needed unless otherwise documented below in the visit note. 

## 2018-01-08 ENCOUNTER — Encounter: Payer: Self-pay | Admitting: Family Medicine

## 2018-01-10 ENCOUNTER — Telehealth: Payer: Self-pay | Admitting: *Deleted

## 2018-01-10 ENCOUNTER — Other Ambulatory Visit: Payer: Self-pay | Admitting: *Deleted

## 2018-01-10 DIAGNOSIS — M792 Neuralgia and neuritis, unspecified: Secondary | ICD-10-CM

## 2018-01-10 MED ORDER — VENLAFAXINE HCL ER 75 MG PO CP24: 75.0000 mg | ORAL_CAPSULE | Freq: Every day | ORAL | 0 refills | Status: DC

## 2018-01-10 NOTE — Telephone Encounter (Signed)
Received Physician Orders from [facility]; forwarded to provider/SLS 03/07

## 2018-01-11 ENCOUNTER — Other Ambulatory Visit: Payer: Self-pay

## 2018-01-11 DIAGNOSIS — C50212 Malignant neoplasm of upper-inner quadrant of left female breast: Secondary | ICD-10-CM

## 2018-01-11 MED ORDER — GABAPENTIN 800 MG PO TABS: 800.0000 mg | ORAL_TABLET | Freq: Three times a day (TID) | ORAL | 2 refills | Status: DC

## 2018-01-23 ENCOUNTER — Telehealth: Payer: Self-pay | Admitting: Family Medicine

## 2018-01-23 ENCOUNTER — Encounter: Payer: Self-pay | Admitting: Family Medicine

## 2018-01-23 NOTE — Telephone Encounter (Signed)
Copied from [place] [phone]. Topic: Quick Communication - Rx Refill/Question >> Jan 23, 2018 11:06 AM Oliver Pila B wrote: Pt called and states received a letter of a recall on this medication below and is needing assistance on finding a replacement  Medication: valsartan-hydrochlorothiazide (DIOVAN-HCT) 320-25 MG tablet [[id number]]

## 2018-01-30 ENCOUNTER — Encounter: Payer: Self-pay | Admitting: Medical

## 2018-01-30 ENCOUNTER — Telehealth: Payer: Self-pay | Admitting: Medical

## 2018-01-30 ENCOUNTER — Other Ambulatory Visit: Payer: Self-pay | Admitting: Family Medicine

## 2018-01-30 MED ORDER — HYDROCODONE-ACETAMINOPHEN 5-325 MG PO TABS: 1.0000 | ORAL_TABLET | Freq: Four times a day (QID) | ORAL | 0 refills | Status: DC | PRN

## 2018-01-30 NOTE — Telephone Encounter (Signed)
  Dr. Charlett Blake,  I saw pt in October for various reasons and she asked for refill of norco for arthralgia and neuropathy. At that time I did give short rx. Now she sends my chart message asking me for refill again. I explained to her that I would forward message to pcp to review and refill if appropriate.  Thanks, Mackie Pai, PA-C

## 2018-01-30 NOTE — Telephone Encounter (Signed)
I sent her in a small amount of Norco to hold her over til her appt in April.

## 2018-01-31 DIAGNOSIS — R921 Mammographic calcification found on diagnostic imaging of breast: Secondary | ICD-10-CM | POA: Diagnosis not present

## 2018-01-31 DIAGNOSIS — Z853 Personal history of malignant neoplasm of breast: Secondary | ICD-10-CM | POA: Diagnosis not present

## 2018-02-05 ENCOUNTER — Other Ambulatory Visit: Payer: Self-pay | Admitting: Family Medicine

## 2018-02-05 DIAGNOSIS — M792 Neuralgia and neuritis, unspecified: Secondary | ICD-10-CM

## 2018-02-07 ENCOUNTER — Other Ambulatory Visit: Payer: Self-pay

## 2018-02-07 DIAGNOSIS — C50212 Malignant neoplasm of upper-inner quadrant of left female breast: Secondary | ICD-10-CM

## 2018-02-07 MED ORDER — GABAPENTIN 800 MG PO TABS: 800.0000 mg | ORAL_TABLET | Freq: Three times a day (TID) | ORAL | 2 refills | Status: DC

## 2018-02-07 MED ORDER — ANASTROZOLE 1 MG PO TABS: ORAL_TABLET | ORAL | 3 refills | Status: DC

## 2018-02-11 ENCOUNTER — Other Ambulatory Visit: Payer: Self-pay

## 2018-02-11 MED ORDER — POTASSIUM CHLORIDE CRYS ER 20 MEQ PO TBCR: 20.0000 meq | EXTENDED_RELEASE_TABLET | Freq: Every day | ORAL | 1 refills | Status: DC

## 2018-02-11 MED ORDER — VALSARTAN-HYDROCHLOROTHIAZIDE 320-25 MG PO TABS: ORAL_TABLET | ORAL | 1 refills | Status: DC

## 2018-02-12 ENCOUNTER — Encounter: Payer: Self-pay | Admitting: Family Medicine

## 2018-02-12 ENCOUNTER — Ambulatory Visit (INDEPENDENT_AMBULATORY_CARE_PROVIDER_SITE_OTHER): Payer: BC Managed Care – PPO | Admitting: Family Medicine

## 2018-02-12 VITALS — BP 112/72 | HR 84 | Temp 98.5°F | Resp 18 | Wt 184.8 lb

## 2018-02-12 DIAGNOSIS — G5692 Unspecified mononeuropathy of left upper limb: Secondary | ICD-10-CM | POA: Diagnosis not present

## 2018-02-12 DIAGNOSIS — Z79899 Other long term (current) drug therapy: Secondary | ICD-10-CM | POA: Diagnosis not present

## 2018-02-12 DIAGNOSIS — E785 Hyperlipidemia, unspecified: Secondary | ICD-10-CM

## 2018-02-12 DIAGNOSIS — I10 Essential (primary) hypertension: Secondary | ICD-10-CM

## 2018-02-12 DIAGNOSIS — G6289 Other specified polyneuropathies: Secondary | ICD-10-CM

## 2018-02-12 DIAGNOSIS — K5909 Other constipation: Secondary | ICD-10-CM

## 2018-02-12 DIAGNOSIS — R739 Hyperglycemia, unspecified: Secondary | ICD-10-CM | POA: Diagnosis not present

## 2018-02-12 DIAGNOSIS — M792 Neuralgia and neuritis, unspecified: Secondary | ICD-10-CM

## 2018-02-12 MED ORDER — HYDROCODONE-ACETAMINOPHEN 5-325 MG PO TABS: 1.0000 | ORAL_TABLET | Freq: Four times a day (QID) | ORAL | 0 refills | Status: DC | PRN

## 2018-02-12 NOTE — Progress Notes (Signed)
Subjective:  I acted as a Education administrator for Dr. Charlett Blake. [name], [place]  Patient ID: Kristi [name], female    DOB: [birth year], 62 y.o.   MRN: [id number]  No chief complaint on file.   HPI Patient is in today for a 6 week follow up. She has been struggling with burning and pain along lateral aspect of left hand and the symptoms are worsening, to the point where she is having trouble using her hand at times. Does use her hand for work significantly. She denies any falls or injury. Denies CP/palp/SOB/HA/congestion/fevers/GI or GU c/o. Taking meds as prescribed  Patient Care Team: Mosie Lukes, MD as PCP - General (Family Medicine) Nicholas Lose, MD as Consulting Physician (Hematology and Oncology) Lin Landsman, MD as Consulting Physician (Family Medicine) Inocencio Homes, DPM as Consulting Physician (Podiatry) Gery Pray, MD as Consulting Physician (Radiation Oncology) Dan Humphreys as Consulting Physician (Nurse Practitioner)   Past Medical History:  Diagnosis Date  . Anemia    Iron deficinecy anemia  . Anemia 05/14/2017  . Anxiety   . Anxiety and depression 05/15/2014  . Arthritis   . Breast cancer ([place])    left ,last radiation 2'15, last chemo 8'14  . Constipation 11/13/2016  . Depression   . History of chicken pox   . History of radiation therapy 09/09/13-10/28/13   45 gray to left breast, lumpectomy cavity boosted to 63 gray  . HTN (hypertension) 11/13/2016  . Hyperglycemia 01/09/2016  . Hyperlipidemia 05/14/2017  . Hypertension   . MRSA (methicillin resistant Staphylococcus aureus) 2009   right groin area-no issues now. 04-07-14 PCR screen negative today.  . Neuropathy   . Preventative health care 11/13/2016    Past Surgical History:  Procedure Laterality Date  . ANAL SPHINCTEROTOMY  04/2011  . APPENDECTOMY  1980  . AXILLARY LYMPH NODE DISSECTION Left 02/04/2013   Procedure: LEFT AXILLARY LYMPH NODE DISSECTION;  Surgeon: Stark Klein, MD;  Location: [facility];  Service: General;   Laterality: Left;  End: 8921  . BREAST LUMPECTOMY WITH NEEDLE LOCALIZATION Left 02/04/2013   Procedure: LEFT BREAST LUMPECTOMY WITH NEEDLE LOCALIZATION;  Surgeon: Stark Klein, MD;  Location: [facility];  Service: General;  Laterality: Left;  . BREAST SURGERY     Lumpectomy in april 2014  . HEMORRHOID SURGERY  04/2011   ligation  . MASTECTOMY Left 02/15/2017  . PORT-A-CATH REMOVAL N/A 04/16/2014   Procedure: REMOVAL PORT-A-CATH;  Surgeon: Stark Klein, MD;  Location[facility];  Service: General;  Laterality: N/A;  . PORTACATH PLACEMENT Right 02/04/2013   Procedure: INSERTION PORT-A-CATH;  Surgeon: Stark Klein, MD;  Location: [facility];  Service: General;  Laterality: Right;  Start Time: 1941.  Marland Kitchen SHOULDER ARTHROSCOPY WITH ROTATOR CUFF REPAIR AND SUBACROMIAL DECOMPRESSION Left 02/24/2014   Procedure: SHOULDER ARTHROSCOPY WITH ROTATOR CUFF REPAIR AND SUBACROMIAL DECOMPRESSION;  Surgeon: Meredith Pel, MD;  Location: [facility];  Service: Orthopedics;  Laterality: Left;  LEFT SHOULDER DIAGNOSTIC OPERATIVE ARTHROSCOPY, SUBACROMIAL DECOMPRESSION, ROTATOR CUFF TEAR REPAIR  . SIMPLE MASTECTOMY WITH AXILLARY SENTINEL NODE BIOPSY Left 02/15/2017   Procedure: LEFT MASTECTOMY;  Surgeon: Stark Klein, MD;  [facility];  Service: General;  Laterality: Left;    Family History  Problem Relation Age of Onset  . Lung cancer Father   . Hypertension Father   . Thyroid cancer Father        dx in his 37s  . Cancer Father        lung, thyroid, smoker  . Breast  cancer Paternal Aunt 55  . Colon cancer Paternal Aunt        dx in her 50x  . Cervical cancer Paternal Aunt        dzx in her 37s  . Ovarian cancer Cousin        dx in her lage 12s  . Breast cancer Cousin        maternal first cousin, once removed; dx in her late 68s  . Breast cancer Cousin        maternal first cousin once removed; dx in late 34s  . Hypertension Mother   . Diabetes Mother   . Dementia Mother   . Hypertension Brother   . Seizures Brother          Alcohol induced.  . Alcohol abuse Brother        drinker, smoker  . Cancer Paternal Uncle        oral cancer  . Kidney cancer Paternal Grandmother   . Arthritis Daughter        back surgery  . Cancer Cousin        several paternal cousins with brain cancer, leukemia, and other cancers    Social History   Socioeconomic History  . Marital status: Married    Spouse name: Kristi [name]  . Number of children: 1  . Years of education: 86  . Highest education level: Not on file  Occupational History  . Occupation: INVENTORY Hotel manager: [employer]  Social Needs  . Financial resource strain: Not on file  . Food insecurity:    Worry: Not on file    Inability: Not on file  . Transportation needs:    Medical: Not on file    Non-medical: Not on file  Tobacco Use  . Smoking status: Never Smoker  . Smokeless tobacco: Never Used  Substance and Sexual Activity  . Alcohol use: No  . Drug use: No  . Sexual activity: Never    Comment: lives with husband, disability/retirement. [employer], no dietary restrictions  Lifestyle  . Physical activity:    Days per week: Not on file    Minutes per session: Not on file  . Stress: Not on file  Relationships  . Social connections:    Talks on phone: Not on file    Gets together: Not on file    Attends religious service: Not on file    Active member of club or organization: Not on file    Attends meetings of clubs or organizations: Not on file    Relationship status: Not on file  . Intimate partner violence:    Fear of current or ex partner: Not on file    Emotionally abused: Not on file    Physically abused: Not on file    Forced sexual activity: Not on file  Other Topics Concern  . Not on file  Social History Narrative   Patient is married Kristi [name]) and lives at home with her husband.   Patient has one daughter.   Patient is working at [employer]   Patient has a 12th grade education.   Patient drinks very  little caffeine.    Outpatient Medications Prior to Visit  Medication Sig Dispense Refill  . amitriptyline (ELAVIL) 100 MG tablet Take 100 mg by mouth at bedtime.  2  . anastrozole (ARIMIDEX) 1 MG tablet TAKE 1 TABLET (1 MG) BY MOUTH DAILY IN THE MORNING 90 tablet 3  . Ascorbic Acid (  VITAMIN C) 100 MG tablet Take 100 mg by mouth daily.    Marland Kitchen aspirin EC 81 MG tablet Take 81 mg by mouth daily at 3 pm.     . B Complex-C (SUPER B COMPLEX PO) Take 1 tablet by mouth daily.    . clonazePAM (KLONOPIN) 1 MG tablet TAKE 1 TABLET BY MOUTH EVERY MORNING AND 1/2 TABLET AT BEDTIME 45 tablet 1  . Cyanocobalamin (VITAMIN B-12) 5000 MCG SUBL Place 5,000 mcg under the tongue daily.    Marland Kitchen gabapentin (NEURONTIN) 800 MG tablet Take 1 tablet (800 mg total) by mouth 3 (three) times daily. 90 tablet 2  . hydrOXYzine (VISTARIL) 25 MG capsule TAKE ONE CAPSULE BY MOUTH 3 TIMES A DAY AS NEEDED  1  . LATUDA 80 MG TABS tablet TAKE 1 TABLET BY MOUTH EVERY DAY HHS  1  . linaclotide (LINZESS) 145 MCG CAPS capsule Take 1 capsule (145 mcg total) by mouth daily before breakfast. 30 capsule 0  . Multiple Vitamin (MULTIVITAMIN WITH MINERALS) TABS tablet Take 1 tablet by mouth daily.    . potassium chloride SA (KLOR-CON M20) 20 MEQ tablet Take 1 tablet (20 mEq total) by mouth daily. 90 tablet 1  . valsartan-hydrochlorothiazide (DIOVAN-HCT) 320-25 MG tablet TAKE 1 TABLET BY MOUTH EVERY DAY IN THE MORNING 90 tablet 1  . venlafaxine XR (EFFEXOR-XR) 37.5 MG 24 hr capsule TAKE 1 CAPSULE (37.5 MG TOTAL) BY MOUTH DAILY WITH BREAKFAST. 30 capsule 1  . HYDROcodone-acetaminophen (NORCO/VICODIN) 5-325 MG tablet Take 1-2 tablets by mouth every 6 (six) hours as needed. For neuropathy/pain. 30 tablet 0   No facility-administered medications prior to visit.     Allergies  Allergen Reactions  . No Known Allergies     Review of Systems  Constitutional: Negative for fever and malaise/fatigue.  HENT: Negative for congestion.   Eyes: Negative  for blurred vision.  Respiratory: Negative for shortness of breath.   Cardiovascular: Negative for chest pain, palpitations and leg swelling.  Gastrointestinal: Negative for abdominal pain, blood in stool and nausea.  Genitourinary: Negative for dysuria and frequency.  Musculoskeletal: Negative for falls.  Skin: Negative for rash.  Neurological: Positive for sensory change and focal weakness. Negative for dizziness, loss of consciousness and headaches.  Endo/Heme/Allergies: Negative for environmental allergies.  Psychiatric/Behavioral: Negative for depression. The patient is not nervous/anxious.        Objective:    Physical Exam  Constitutional: No distress.  HENT:  Left Ear: External ear normal.  Mouth/Throat: No oropharyngeal exudate.  Eyes: EOM are normal. Left eye exhibits no discharge. No scleral icterus.  Neck: No JVD present. No tracheal deviation present.  Cardiovascular: Normal heart sounds and intact distal pulses.  Pulmonary/Chest: No respiratory distress. She has no rales.  Abdominal: She exhibits no distension and no mass. There is no tenderness. There is no guarding.  Musculoskeletal: She exhibits no edema or tenderness.  Lymphadenopathy:    She has no cervical adenopathy.  Neurological: A sensory deficit is present.  Skin: No rash noted. No erythema.    BP 112/72 (BP Location: Left Arm, Patient Position: Sitting, Cuff Size: Normal)   Pulse 84   Temp 98.5 F (36.9 C) (Oral)   Resp 18   Wt 184 lb 12.8 oz (83.8 kg)   LMP 01/20/2013   SpO2 97%   BMI 33.80 kg/m  Wt Readings from Last 3 Encounters:  02/12/18 184 lb 12.8 oz (83.8 kg)  12/27/17 186 lb 6 oz (84.5 kg)  12/13/17 186 lb  4 oz (84.5 kg)   BP Readings from Last 3 Encounters:  02/12/18 112/72  12/27/17 112/72  12/13/17 108/70      There is no immunization history on file for this patient.  Health Maintenance  Topic Date Due  . Hepatitis C Screening  08[birth year]01/1955  . HIV Screening  01/27/1970    . TETANUS/TDAP  01/27/1974  . INFLUENZA VACCINE  08/29/2018 (Originally 06/06/2018)  . PAP SMEAR  05/16/2018  . MAMMOGRAM  02/02/2019  . COLONOSCOPY  11/06/2024    Lab Results  Component Value Date   WBC 5.9 02/12/2018   HGB 11.4 (L) 02/12/2018   HCT 35.4 (L) 02/12/2018   PLT 176.0 02/12/2018   GLUCOSE 79 02/12/2018   CHOL 167 05/14/2017   TRIG 113.0 05/14/2017   HDL 47.60 05/14/2017   LDLCALC 96 05/14/2017   ALT 16 02/12/2018   AST 20 02/12/2018   NA 141 02/12/2018   K 3.9 02/12/2018   CL 103 02/12/2018   CREATININE 1.17 02/12/2018   BUN 11 02/12/2018   CO2 30 02/12/2018   TSH 1.07 05/14/2017   INR 1.02 07/19/2013   HGBA1C 5.9 02/12/2018    Lab Results  Component Value Date   TSH 1.07 05/14/2017   Lab Results  Component Value Date   WBC 5.9 02/12/2018   HGB 11.4 (L) 02/12/2018   HCT 35.4 (L) 02/12/2018   MCV 88.1 02/12/2018   PLT 176.0 02/12/2018   Lab Results  Component Value Date   NA 141 02/12/2018   K 3.9 02/12/2018   CHLORIDE 104 01/22/2017   CO2 30 02/12/2018   GLUCOSE 79 02/12/2018   BUN 11 02/12/2018   CREATININE 1.17 02/12/2018   BILITOT 0.3 02/12/2018   ALKPHOS 70 02/12/2018   AST 20 02/12/2018   ALT 16 02/12/2018   PROT 7.2 02/12/2018   ALBUMIN 4.1 02/12/2018   CALCIUM 9.7 02/12/2018   ANIONGAP 7 02/16/2017   EGFR 67 (L) 01/22/2017   GFR 60.07 02/12/2018   Lab Results  Component Value Date   CHOL 167 05/14/2017   Lab Results  Component Value Date   HDL 47.60 05/14/2017   Lab Results  Component Value Date   LDLCALC 96 05/14/2017   Lab Results  Component Value Date   TRIG 113.0 05/14/2017   Lab Results  Component Value Date   CHOLHDL 4 05/14/2017   Lab Results  Component Value Date   HGBA1C 5.9 02/12/2018         Assessment & Plan:   Problem List Items Addressed This Visit    Neuropathy of hand, left    Has been struggling with burning and pain along lateral aspect of left hand and the symptoms are worsening, to  the point where she is having trouble using her hand at times. Does use her hand for work significantly. She is encouraged to ice and apply lidocaine patches and then is fitted with a brace to wear qhs and as much as tolerated. Then she is referred to hand surgeon for further consideration.       Relevant Medications   HYDROcodone-acetaminophen (NORCO/VICODIN) 5-325 MG tablet   Hyperglycemia     minimize simple carbs. Increase exercise as tolerated.       Relevant Orders   Hemoglobin A1c (Completed)   Comprehensive metabolic panel (Completed)   Constipation   Relevant Orders   CBC (Completed)   HTN (hypertension)    Well controlled, no changes to meds. Encouraged heart healthy diet such as the  DASH diet and exercise as tolerated.       Hyperlipidemia    Encouraged heart healthy diet, increase exercise, avoid trans fats, consider a krill oil cap daily      Neuropathic pain    Burning in b/l feet is stable and manageable with Gabapentin. No changes.        Other Visit Diagnoses    High risk medication use    -  Primary   Relevant Orders   Pain Mgmt, Profile 8 w/Conf, U (Completed)      I am having [name] maintain her B Complex-C (SUPER B COMPLEX PO), aspirin EC, amitriptyline, vitamin C, multivitamin with minerals, Vitamin B-12, clonazePAM, LATUDA, hydrOXYzine, linaclotide, venlafaxine XR, anastrozole, gabapentin, valsartan-hydrochlorothiazide, potassium chloride SA, and HYDROcodone-acetaminophen.  Meds ordered this encounter  Medications  . HYDROcodone-acetaminophen (NORCO/VICODIN) 5-325 MG tablet    Sig: Take 1-2 tablets by mouth every 6 (six) hours as needed. For neuropathy/pain.    Dispense:  30 tablet    Refill:  0    CMA served as scribe during this visit. History, Physical and Plan performed by medical provider. Documentation and orders reviewed and attested to.  [name], MD

## 2018-02-12 NOTE — Patient Instructions (Addendum)
Mix Miralax and Benefiber together and take once to twice daily to keep bowels moving.   Shingrix is the new shingles shot, 2 shots over 2-6 months, call monthly, check with insurance to confirm payment and call for appt. Constipation, Adult Constipation is when a person:  Poops (has a bowel movement) fewer times in a week than normal.  Has a hard time pooping.  Has poop that is dry, hard, or bigger than normal.  Follow these instructions at home: Eating and drinking   Eat foods that have a lot of fiber, such as: ? Fresh fruits and vegetables. ? Whole grains. ? Beans.  Eat less of foods that are high in fat, low in fiber, or overly processed, such as: ? [place] fries. ? Hamburgers. ? Cookies. ? Candy. ? Soda.  Drink enough fluid to keep your pee (urine) clear or pale yellow. General instructions  Exercise regularly or as told by your doctor.  Go to the restroom when you feel like you need to poop. Do not hold it in.  Take over-the-counter and prescription medicines only as told by your doctor. These include any fiber supplements.  Do pelvic floor retraining exercises, such as: ? Doing deep breathing while relaxing your lower belly (abdomen). ? Relaxing your pelvic floor while pooping.  Watch your condition for any changes.  Keep all follow-up visits as told by your doctor. This is important. Contact a doctor if:  You have pain that gets worse.  You have a fever.  You have not pooped for 4 days.  You throw up (vomit).  You are not hungry.  You lose weight.  You are bleeding from the anus.  You have thin, pencil-like poop (stool). Get help right away if:  You have a fever, and your symptoms suddenly get worse.  You leak poop or have blood in your poop.  Your belly feels hard or bigger than normal (is bloated).  You have very bad belly pain.  You feel dizzy or you faint. This information is not intended to replace advice given to you by your health  care provider. Make sure you discuss any questions you have with your health care provider. Document Released: 04/10/2008 Document Revised: 05/12/2016 Document Reviewed: 04/12/2016 Elsevier Interactive Patient Education  2018 [employer].

## 2018-02-13 LAB — COMPREHENSIVE METABOLIC PANEL
ALT: 16 U/L (ref 0–35)
AST: 20 U/L (ref 0–37)
Albumin: 4.1 g/dL (ref 3.5–5.2)
Alkaline Phosphatase: 70 U/L (ref 39–117)
BUN: 11 mg/dL (ref 6–23)
CHLORIDE: 103 meq/L (ref 96–112)
CO2: 30 meq/L (ref 19–32)
CREATININE: 1.17 mg/dL (ref 0.40–1.20)
Calcium: 9.7 mg/dL (ref 8.4–10.5)
GFR: 60.07 mL/min (ref >60.00)
GLUCOSE: 79 mg/dL (ref 70–99)
POTASSIUM: 3.9 meq/L (ref 3.5–5.1)
SODIUM: 141 meq/L (ref 135–145)
Total Bilirubin: 0.3 mg/dL (ref 0.2–1.2)
Total Protein: 7.2 g/dL (ref 6.0–8.3)

## 2018-02-13 LAB — CBC
HEMATOCRIT: 35.4 % — AB (ref 36.0–46.0)
HEMOGLOBIN: 11.4 g/dL — AB (ref 12.0–15.0)
MCHC: 32.4 g/dL (ref 30.0–36.0)
MCV: 88.1 fl (ref 78.0–100.0)
Platelets: 176 10*3/uL (ref 150.0–400.0)
RBC: 4.01 Mil/uL (ref 3.87–5.11)
RDW: 14.8 % (ref 11.5–15.5)
WBC: 5.9 10*3/uL (ref 4.0–10.5)

## 2018-02-13 LAB — HEMOGLOBIN A1C: HEMOGLOBIN A1C: 5.9 % (ref 4.6–6.5)

## 2018-02-15 ENCOUNTER — Other Ambulatory Visit: Payer: Self-pay | Admitting: Family Medicine

## 2018-02-15 LAB — PAIN MGMT, PROFILE 8 W/CONF, U
6 ACETYLMORPHINE: NEGATIVE ng/mL (ref <10)
ALCOHOL METABOLITES: NEGATIVE ng/mL (ref <500)
ALPHAHYDROXYTRIAZOLAM: NEGATIVE ng/mL (ref <50)
AMPHETAMINES: NEGATIVE ng/mL (ref <500)
Alphahydroxyalprazolam: NEGATIVE ng/mL (ref <25)
Alphahydroxymidazolam: NEGATIVE ng/mL (ref <50)
Aminoclonazepam: 384 ng/mL — ABNORMAL HIGH (ref <25)
Benzodiazepines: POSITIVE ng/mL — AB (ref <100)
Buprenorphine, Urine: NEGATIVE ng/mL (ref <5)
CREATININE: 62.3 mg/dL
Cocaine Metabolite: NEGATIVE ng/mL (ref <150)
Codeine: NEGATIVE ng/mL (ref <50)
HYDROXYETHYLFLURAZEPAM: NEGATIVE ng/mL (ref <50)
Hydrocodone: 338 ng/mL — ABNORMAL HIGH (ref <50)
Hydromorphone: 57 ng/mL — ABNORMAL HIGH (ref <50)
Lorazepam: NEGATIVE ng/mL (ref <50)
MARIJUANA METABOLITE: NEGATIVE ng/mL (ref <20)
MDMA: NEGATIVE ng/mL (ref <500)
MORPHINE: NEGATIVE ng/mL (ref <50)
NORDIAZEPAM: NEGATIVE ng/mL (ref <50)
NORHYDROCODONE: 269 ng/mL — AB (ref <50)
OXAZEPAM: NEGATIVE ng/mL (ref <50)
OXIDANT: NEGATIVE ug/mL (ref <200)
Opiates: POSITIVE ng/mL — AB (ref <100)
Oxycodone: NEGATIVE ng/mL (ref <100)
Temazepam: NEGATIVE ng/mL (ref <50)
pH: 6.22 (ref 4.5–9.0)

## 2018-02-17 NOTE — Assessment & Plan Note (Signed)
Has been struggling with burning and pain along lateral aspect of left hand and the symptoms are worsening, to the point where she is having trouble using her hand at times. Does use her hand for work significantly. She is encouraged to ice and apply lidocaine patches and then is fitted with a brace to wear qhs and as much as tolerated. Then she is referred to hand surgeon for further consideration.

## 2018-02-17 NOTE — Assessment & Plan Note (Signed)
Encouraged heart healthy diet, increase exercise, avoid trans fats, consider a krill oil cap daily 

## 2018-02-17 NOTE — Assessment & Plan Note (Signed)
Well controlled, no changes to meds. Encouraged heart healthy diet such as the DASH diet and exercise as tolerated.  °

## 2018-02-17 NOTE — Assessment & Plan Note (Signed)
Burning in b/l feet is stable and manageable with Gabapentin. No changes.

## 2018-02-17 NOTE — Assessment & Plan Note (Signed)
minimize simple carbs. Increase exercise as tolerated.  

## 2018-02-18 ENCOUNTER — Encounter (INDEPENDENT_AMBULATORY_CARE_PROVIDER_SITE_OTHER): Payer: Self-pay | Admitting: Orthopaedic Surgery

## 2018-02-18 ENCOUNTER — Ambulatory Visit (INDEPENDENT_AMBULATORY_CARE_PROVIDER_SITE_OTHER): Payer: Medicare Other

## 2018-02-18 ENCOUNTER — Ambulatory Visit (INDEPENDENT_AMBULATORY_CARE_PROVIDER_SITE_OTHER): Payer: BC Managed Care – PPO | Admitting: Orthopaedic Surgery

## 2018-02-18 DIAGNOSIS — M79642 Pain in left hand: Secondary | ICD-10-CM | POA: Diagnosis not present

## 2018-02-18 NOTE — Progress Notes (Signed)
Office Visit Note   Patient: Kristi [name]           Date of Birth: 11-12-1955           MRN: [id number] Visit Date: 02/18/2018              Requested by: Mosie Lukes, MD [address] [place], [place] [address] PCP: Mosie Lukes, MD   Assessment & Plan: Visit Diagnoses:  1. Pain in left hand     Plan: At this point, we will go ahead and obtain a nerve conduction study   Of the left upper extremity.  She will follow-up with [place] once that is completed.  She will call with concerns or questions in the meantime.  Follow-Up Instructions: Return in about 2 weeks (around 03/04/2018).   Orders:  Orders Placed This Encounter  Procedures  . XR Hand Complete Left   No orders of the defined types were placed in this encounter.     Procedures: No procedures performed   Clinical Data: No additional findings.   Subjective: Chief Complaint  Patient presents with  . Left Hand - Pain    HPI patient is a pleasant 62 year old female who presents our clinic today with left hand numbness and tingling.  She was diagnosed with breast cancer approximately 5 years ago and noticed the above-mentioned symptoms following left lymph node dissection.  She notes numbness and tingling to the left small finger as well as the tips of the other 4 fingers.  The symptoms are constant.  She has been on gabapentin for a while and is unsure whether or not this helps.  She was told that this is likely carpal tunnel syndrome.  No history of cervical spine pathology or neck pain.    Review of Systems as detailed in HPI.  All are reviewed and are negative.   Objective: Vital Signs: LMP 01/20/2013   Physical Exam well-developed well-nourished female in no acute distress.  Alert and oriented x3.  Ortho Exam examination of the left hand reveals  equal sensation as compared to the right hand.  Negative Phalen's.  Negative Tinel at the wrist and elbow.  Full range of motion and strength.   Normal cervical spine exam.  Specialty Comments:  No specialty comments available.  Imaging: Xr Hand Complete Left  Result Date: 02/18/2018 X-rays of the left hand are negative for structural abnormalities    PMFS History: Patient Active Problem List   Diagnosis Date Noted  . Pain in left hand 02/18/2018  . Neuropathic pain 12/13/2017  . Anemia 05/14/2017  . Hyperlipidemia 05/14/2017  . History of left breast cancer 02/15/2017  . Nodule of finger of left hand 11/14/2016  . Right hip pain 11/14/2016  . Chronic pain of both knees 11/14/2016  . Constipation 11/13/2016  . Preventative health care 11/13/2016  . HTN (hypertension) 11/13/2016  . Hyperglycemia 01/09/2016  . Neuropathy of hand, left 09/21/2015  . History of chicken pox   . Shingles 05/15/2014  . Anxiety and depression 05/15/2014  . Rotator cuff tear 02/24/2014  . Left shoulder pain with abduction 12/12/2013  . CVA (cerebral infarction) 07/18/2013  . TIA (transient ischemic attack) 07/18/2013  . Contracture of axilla 05/20/2013  . Primary cancer of upper inner quadrant of left female breast ([facility]) 01/24/2013  . Anal fissure 05/03/2011  . Hemorrhoids, internal, with prolapse 05/03/2011   Past Medical History:  Diagnosis Date  . Anemia    Iron deficinecy  anemia  . Anemia 05/14/2017  . Anxiety   . Anxiety and depression 05/15/2014  . Arthritis   . Breast cancer ([facility])    left ,last radiation 2'15, last chemo 8'14  . Constipation 11/13/2016  . Depression   . History of chicken pox   . History of radiation therapy 09/09/13-10/28/13   45 gray to left breast, lumpectomy cavity boosted to 63 gray  . HTN (hypertension) 11/13/2016  . Hyperglycemia 01/09/2016  . Hyperlipidemia 05/14/2017  . Hypertension   . MRSA (methicillin resistant Staphylococcus aureus) 2009   right groin area-no issues now. 04-07-14 PCR screen negative today.  . Neuropathy   . Preventative health care 11/13/2016    Family History  Problem Relation Age  of Onset  . Lung cancer Father   . Hypertension Father   . Thyroid cancer Father        dx in his 48s  . Cancer Father        lung, thyroid, smoker  . Breast cancer Paternal Aunt 53  . Colon cancer Paternal Aunt        dx in her 50x  . Cervical cancer Paternal Aunt        dzx in her 48s  . Ovarian cancer Cousin        dx in her lage 64s  . Breast cancer Cousin        maternal first cousin, once removed; dx in her late 54s  . Breast cancer Cousin        maternal first cousin once removed; dx in late 30s  . Hypertension Mother   . Diabetes Mother   . Dementia Mother   . Hypertension Brother   . Seizures Brother        Alcohol induced.  . Alcohol abuse Brother        drinker, smoker  . Cancer Paternal Uncle        oral cancer  . Kidney cancer Paternal Grandmother   . Arthritis Daughter        back surgery  . Cancer Cousin        several paternal cousins with brain cancer, leukemia, and other cancers    Past Surgical History:  Procedure Laterality Date  . ANAL SPHINCTEROTOMY  04/2011  . APPENDECTOMY  1980  . AXILLARY LYMPH NODE DISSECTION Left 02/04/2013   Procedure: LEFT AXILLARY LYMPH NODE DISSECTION;  Surgeon: Stark Klein, MD;  Location: [facility];  Service: General;  Laterality: Left;  End: 9767  . BREAST LUMPECTOMY WITH NEEDLE LOCALIZATION Left 02/04/2013   Procedure: LEFT BREAST LUMPECTOMY WITH NEEDLE LOCALIZATION;  Surgeon: Stark Klein, MD;  Location: [facility];  Service: General;  Laterality: Left;  . BREAST SURGERY     Lumpectomy in april 2014  . HEMORRHOID SURGERY  04/2011   ligation  . MASTECTOMY Left 02/15/2017  . PORT-A-CATH REMOVAL N/A 04/16/2014   Procedure: REMOVAL PORT-A-CATH;  Surgeon: Stark Klein, MD;  [facility];  Service: General;  Laterality: N/A;  . PORTACATH PLACEMENT Right 02/04/2013   Procedure: INSERTION PORT-A-CATH;  Surgeon: Stark Klein, MD;  Location: [facility];  Service: General;  Laterality: Right;  Start Time: 3419.  Marland Kitchen SHOULDER ARTHROSCOPY WITH  ROTATOR CUFF REPAIR AND SUBACROMIAL DECOMPRESSION Left 02/24/2014   Procedure: SHOULDER ARTHROSCOPY WITH ROTATOR CUFF REPAIR AND SUBACROMIAL DECOMPRESSION;  Surgeon: Meredith Pel, MD;  Location: [facility];  Service: Orthopedics;  Laterality: Left;  LEFT SHOULDER DIAGNOSTIC OPERATIVE ARTHROSCOPY, SUBACROMIAL DECOMPRESSION, ROTATOR CUFF TEAR REPAIR  . SIMPLE MASTECTOMY  WITH AXILLARY SENTINEL NODE BIOPSY Left 02/15/2017   Procedure: LEFT MASTECTOMY;  Surgeon: Stark Klein, MD;  Location: [facility];  Service: General;  Laterality: Left;   Social History   Occupational History  . 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 Smoking status: Never Smoker  . Smokeless tobacco: Never Used  Substance and Sexual Activity  . Alcohol use: No  . Drug use: No  . Sexual activity: Never    Comment: lives with husband, disability/retirement. [employer], no dietary restrictions

## 2018-02-18 NOTE — Addendum Note (Signed)
Addended by: Precious Bard on: 02/18/2018 04:17 PM   Modules accepted: Orders

## 2018-02-20 DIAGNOSIS — F331 Major depressive disorder, recurrent, moderate: Secondary | ICD-10-CM | POA: Diagnosis not present

## 2018-03-06 DIAGNOSIS — F331 Major depressive disorder, recurrent, moderate: Secondary | ICD-10-CM | POA: Diagnosis not present

## 2018-03-08 ENCOUNTER — Encounter (INDEPENDENT_AMBULATORY_CARE_PROVIDER_SITE_OTHER): Payer: Self-pay | Admitting: Physical Medicine and Rehabilitation

## 2018-03-08 ENCOUNTER — Ambulatory Visit (INDEPENDENT_AMBULATORY_CARE_PROVIDER_SITE_OTHER): Payer: Medicare Other | Admitting: Physical Medicine and Rehabilitation

## 2018-03-08 DIAGNOSIS — R202 Paresthesia of skin: Secondary | ICD-10-CM

## 2018-03-08 NOTE — Progress Notes (Signed)
.  Numeric Pain Rating Scale and Functional Assessment Average Pain 5   In the last MONTH (on 0-10 scale) has pain interfered with the following?  1. General activity like being  able to carry out your everyday physical activities such as walking, climbing stairs, carrying groceries, or moving a chair?  Rating(8)   

## 2018-03-11 NOTE — Procedures (Signed)
EMG & NCV Findings: All nerve conduction studies (as indicated in the following tables) were within normal limits.    All examined muscles (as indicated in the following table) showed no evidence of electrical instability.    Impression: Essentially NORMAL electrodiagnostic study of the left upper limb.  There is no significant electrodiagnostic evidence of nerve entrapment, brachial plexopathy or cervical radiculopathy.  There were no signs of radiation induced nerve damage.  There were no myokymia discharges on EMG.   As you know, purely sensory or demyelinating radiculopathies and chemical radiculitis may not be detected with this particular electrodiagnostic study.   Recommendations: 1.  Follow-up with referring physician. 2.  Continue current management of symptoms.   Nerve Conduction Studies Anti Sensory Summary Table   Stim Site NR Peak (ms) Norm Peak (ms) P-T Amp (V) Norm P-T Amp Site1 Site2 Delta-P (ms) Dist (cm) Vel (m/s) Norm Vel (m/s)  Left Median Acr Palm Anti Sensory (2nd Digit)  31.9C  Wrist    3.1 <3.6 14.9 >10 Wrist Palm 1.5 0.0    Palm    1.6 <2.0 17.3         Left Radial Anti Sensory (Base 1st Digit)  32.4C  Wrist    1.9 <3.1 29.1  Wrist Base 1st Digit 1.9 0.0    Left Ulnar Anti Sensory (5th Digit)  32.5C  Wrist    2.9 <3.7 21.4 >15.0 Wrist 5th Digit 2.9 14.0 48 >38   Motor Summary Table   Stim Site NR Onset (ms) Norm Onset (ms) O-P Amp (mV) Norm O-P Amp Site1 Site2 Delta-0 (ms) Dist (cm) Vel (m/s) Norm Vel (m/s)  Left Median Motor (Abd Poll Brev)  32.8C  Wrist    3.0 <4.2 7.6 >5 Elbow Wrist 3.7 21.0 57 >50  Elbow    6.7  7.5         Left Ulnar Motor (Abd Dig Min)  33.3C  Wrist    2.6 <4.2 6.5 >3 B Elbow Wrist 2.8 19.0 68 >53  B Elbow    5.4  7.8  A Elbow B Elbow 1.1 10.0 91 >53  A Elbow    6.5  8.0          EMG   Side Muscle Nerve Root Ins Act Fibs Psw Amp Dur Poly Recrt Int Fraser Din Comment  Left 1stDorInt Ulnar C8-T1 Nml Nml Nml Nml Nml 0 Nml Nml     Left Abd Poll Brev Median C8-T1 Nml Nml Nml Nml Nml 0 Nml Nml   Left ExtDigCom   Nml Nml Nml Nml Nml 0 Nml Nml   Left Triceps Radial C6-7-8 Nml Nml Nml Nml Nml 0 Nml Nml   Left Deltoid Axillary C5-6 Nml Nml Nml Nml Nml 0 Nml Nml     Nerve Conduction Studies Anti Sensory Left/Right Comparison   Stim Site L Lat (ms) R Lat (ms) L-R Lat (ms) L Amp (V) R Amp (V) L-R Amp (%) Site1 Site2 L Vel (m/s) R Vel (m/s) L-R Vel (m/s)  Median Acr Palm Anti Sensory (2nd Digit)  31.9C  Wrist 3.1   14.9   Wrist Palm     Palm 1.6   17.3         Radial Anti Sensory (Base 1st Digit)  32.4C  Wrist 1.9   29.1   Wrist Base 1st Digit     Ulnar Anti Sensory (5th Digit)  32.5C  Wrist 2.9   21.4   Wrist 5th Digit 48  Motor Left/Right Comparison   Stim Site L Lat (ms) R Lat (ms) L-R Lat (ms) L Amp (mV) R Amp (mV) L-R Amp (%) Site1 Site2 L Vel (m/s) R Vel (m/s) L-R Vel (m/s)  Median Motor (Abd Poll Brev)  32.8C  Wrist 3.0   7.6   Elbow Wrist 57    Elbow 6.7   7.5         Ulnar Motor (Abd Dig Min)  33.3C  Wrist 2.6   6.5   B Elbow Wrist 68    B Elbow 5.4   7.8   A Elbow B Elbow 91    A Elbow 6.5   8.0            Waveforms:

## 2018-03-11 NOTE — Progress Notes (Signed)
Kristi [name] - 62 y.o. female MRN [id number]  Date of birth: December 04, 1955  Office Visit Note: Visit Date: 03/08/2018 PCP: Mosie Lukes, MD Referred by: Mosie Lukes, MD  Subjective: Chief Complaint  Patient presents with  . Left Hand - Pain, Tingling   HPI: Kristi [name] is a 62 year old left-hand-dominant female who reports chronic worsening tingling particularly into the left fifth digit that started approximately 6 months ago.  She has had a history of breast cancer first diagnosed in 2014 and she underwent chemotherapy and radiation therapy and is now status post mastectomy.  She reports the symptoms started about that time.  Dr. Erlinda Hong has been seeing her and requested electrodiagnostic study of the left upper limb.  She reports that driving and using left hand makes the symptoms worse.  She has not really found anything that would make this any better.  She is been followed by her primary care physician for foot pain which is felt to be neuropathic pain.  She denies any right-sided upper extremity symptoms.  She has not had prior electrodiagnostic study or cervical MRI.  In February 2018 she had a mass excised off the left ring finger by Dr. Durward Fortes in our practice.   ROS Otherwise per HPI.  Assessment & Plan: Visit Diagnoses:  1. Paresthesia of skin     Plan: No additional findings.  Impression: Essentially NORMAL electrodiagnostic study of the left upper limb.  There is no significant electrodiagnostic evidence of nerve entrapment, brachial plexopathy or cervical radiculopathy.  There were no signs of radiation induced nerve damage.  There were no myokymia discharges on EMG.  As you know, purely sensory or demyelinating radiculopathies and chemical radiculitis may not be detected with this particular electrodiagnostic study.   Recommendations: 1.  Follow-up with referring physician. 2.  Continue current management of symptoms.   Meds & Orders: No orders of the defined types  were placed in this encounter.   Orders Placed This Encounter  Procedures  . NCV with EMG (electromyography)    Follow-up: No follow-ups on file.   Procedures: No procedures performed  EMG & NCV Findings: All nerve conduction studies (as indicated in the following tables) were within normal limits.    All examined muscles (as indicated in the following table) showed no evidence of electrical instability.    Impression: Essentially NORMAL electrodiagnostic study of the left upper limb.  There is no significant electrodiagnostic evidence of nerve entrapment, brachial plexopathy or cervical radiculopathy.  There were no signs of radiation induced nerve damage.  There were no myokymia discharges on EMG.   As you know, purely sensory or demyelinating radiculopathies and chemical radiculitis may not be detected with this particular electrodiagnostic study.   Recommendations: 1.  Follow-up with referring physician. 2.  Continue current management of symptoms.   Nerve Conduction Studies Anti Sensory Summary Table   Stim Site NR Peak (ms) Norm Peak (ms) P-T Amp (V) Norm P-T Amp Site1 Site2 Delta-P (ms) Dist (cm) Vel (m/s) Norm Vel (m/s)  Left Median Acr Palm Anti Sensory (2nd Digit)  31.9C  Wrist    3.1 <3.6 14.9 >10 Wrist Palm 1.5 0.0    Palm    1.6 <2.0 17.3         Left Radial Anti Sensory (Base 1st Digit)  32.4C  Wrist    1.9 <3.1 29.1  Wrist Base 1st Digit 1.9 0.0    Left Ulnar Anti Sensory (5th Digit)  32.5C  Wrist  2.9 <3.7 21.4 >15.0 Wrist 5th Digit 2.9 14.0 48 >38   Motor Summary Table   Stim Site NR Onset (ms) Norm Onset (ms) O-P Amp (mV) Norm O-P Amp Site1 Site2 Delta-0 (ms) Dist (cm) Vel (m/s) Norm Vel (m/s)  Left Median Motor (Abd Poll Brev)  32.8C  Wrist    3.0 <4.2 7.6 >5 Elbow Wrist 3.7 21.0 57 >50  Elbow    6.7  7.5         Left Ulnar Motor (Abd Dig Min)  33.3C  Wrist    2.6 <4.2 6.5 >3 B Elbow Wrist 2.8 19.0 68 >53  B Elbow    5.4  7.8  A Elbow B Elbow  1.1 10.0 91 >53  A Elbow    6.5  8.0          EMG   Side Muscle Nerve Root Ins Act Fibs Psw Amp Dur Poly Recrt Int Fraser Din Comment  Left 1stDorInt Ulnar C8-T1 Nml Nml Nml Nml Nml 0 Nml Nml   Left Abd Poll Brev Median C8-T1 Nml Nml Nml Nml Nml 0 Nml Nml   Left ExtDigCom   Nml Nml Nml Nml Nml 0 Nml Nml   Left Triceps Radial C6-7-8 Nml Nml Nml Nml Nml 0 Nml Nml   Left Deltoid Axillary C5-6 Nml Nml Nml Nml Nml 0 Nml Nml     Nerve Conduction Studies Anti Sensory Left/Right Comparison   Stim Site L Lat (ms) R Lat (ms) L-R Lat (ms) L Amp (V) R Amp (V) L-R Amp (%) Site1 Site2 L Vel (m/s) R Vel (m/s) L-R Vel (m/s)  Median Acr Palm Anti Sensory (2nd Digit)  31.9C  Wrist 3.1   14.9   Wrist Palm     Palm 1.6   17.3         Radial Anti Sensory (Base 1st Digit)  32.4C  Wrist 1.9   29.1   Wrist Base 1st Digit     Ulnar Anti Sensory (5th Digit)  32.5C  Wrist 2.9   21.4   Wrist 5th Digit 48     Motor Left/Right Comparison   Stim Site L Lat (ms) R Lat (ms) L-R Lat (ms) L Amp (mV) R Amp (mV) L-R Amp (%) Site1 Site2 L Vel (m/s) R Vel (m/s) L-R Vel (m/s)  Median Motor (Abd Poll Brev)  32.8C  Wrist 3.0   7.6   Elbow Wrist 57    Elbow 6.7   7.5         Ulnar Motor (Abd Dig Min)  33.3C  Wrist 2.6   6.5   B Elbow Wrist 68    B Elbow 5.4   7.8   A Elbow B Elbow 91    A Elbow 6.5   8.0            Waveforms:            Clinical History: No specialty comments available.   She reports that she has never smoked. She has never used smokeless tobacco.  Recent Labs    05/14/17 1101 02/12/18 1350  HGBA1C 5.9 5.9    Objective:  VS:  HT:    WT:   BMI:     BP:   HR: bpm  TEMP: ( )  RESP:  Physical Exam  Musculoskeletal:  Inspection reveals no atrophy of the bilateral APB or FDI or hand intrinsics. There is no swelling, color changes, allodynia or dystrophic changes. There is 5 out of 5 strength  in the bilateral wrist extension, finger abduction and long finger flexion.  There is  subjective impairment of sensation to the left fifth digit.  The left fourth digit is unimpaired.  There is a negative Hoffmann's test bilaterally.    Ortho Exam Imaging: No results found.  Past Medical/Family/Surgical/Social History: Medications & Allergies reviewed per EMR, new medications updated. Patient Active Problem List   Diagnosis Date Noted  . Pain in left hand 02/18/2018  . Neuropathic pain 12/13/2017  . Anemia 05/14/2017  . Hyperlipidemia 05/14/2017  . History of left breast cancer 02/15/2017  . Nodule of finger of left hand 11/14/2016  . Right hip pain 11/14/2016  . Chronic pain of both knees 11/14/2016  . Constipation 11/13/2016  . Preventative health care 11/13/2016  . HTN (hypertension) 11/13/2016  . Hyperglycemia 01/09/2016  . Neuropathy of hand, left 09/21/2015  . History of chicken pox   . Shingles 05/15/2014  . Anxiety and depression 05/15/2014  . Rotator cuff tear 02/24/2014  . Left shoulder pain with abduction 12/12/2013  . CVA (cerebral infarction) 07/18/2013  . TIA (transient ischemic attack) 07/18/2013  . Contracture of axilla 05/20/2013  . Primary cancer of upper inner quadrant of left female breast ([place]) 01/24/2013  . Anal fissure 05/03/2011  . Hemorrhoids, internal, with prolapse 05/03/2011   Past Medical History:  Diagnosis Date  . Anemia    Iron deficinecy anemia  . Anemia 05/14/2017  . Anxiety   . Anxiety and depression 05/15/2014  . Arthritis   . Breast cancer ([place])    left ,last radiation 2'15, last chemo 8'14  . Constipation 11/13/2016  . Depression   . History of chicken pox   . History of radiation therapy 09/09/13-10/28/13   45 gray to left breast, lumpectomy cavity boosted to 63 gray  . HTN (hypertension) 11/13/2016  . Hyperglycemia 01/09/2016  . Hyperlipidemia 05/14/2017  . Hypertension   . MRSA (methicillin resistant Staphylococcus aureus) 2009   right groin area-no issues now. 04-07-14 PCR screen negative today.  . Neuropathy   .  Preventative health care 11/13/2016   Family History  Problem Relation Age of Onset  . Lung cancer Father   . Hypertension Father   . Thyroid cancer Father        dx in his 65s  . Cancer Father        lung, thyroid, smoker  . Breast cancer Paternal Aunt 33  . Colon cancer Paternal Aunt        dx in her 50x  . Cervical cancer Paternal Aunt        dzx in her 68s  . Ovarian cancer Cousin        dx in her lage 74s  . Breast cancer Cousin        maternal first cousin, once removed; dx in her late 98s  . Breast cancer Cousin        maternal first cousin once removed; dx in late 44s  . Hypertension Mother   . Diabetes Mother   . Dementia Mother   . Hypertension Brother   . Seizures Brother        Alcohol induced.  . Alcohol abuse Brother        drinker, smoker  . Cancer Paternal Uncle        oral cancer  . Kidney cancer Paternal Grandmother   . Arthritis Daughter        back surgery  . Cancer Cousin  several paternal cousins with brain cancer, leukemia, and other cancers   Past Surgical History:  Procedure Laterality Date  . ANAL SPHINCTEROTOMY  04/2011  . APPENDECTOMY  1980  . AXILLARY LYMPH NODE DISSECTION Left 02/04/2013   Procedure: LEFT AXILLARY LYMPH NODE DISSECTION;  Surgeon: Stark Klein, MD;  Location: [facility];  Service: General;  Laterality: Left;  End: 2122  . BREAST LUMPECTOMY WITH NEEDLE LOCALIZATION Left 02/04/2013   Procedure: LEFT BREAST LUMPECTOMY WITH NEEDLE LOCALIZATION;  Surgeon: Stark Klein, MD;  Location: [facility];  Service: General;  Laterality: Left;  . BREAST SURGERY     Lumpectomy in april 2014  . HEMORRHOID SURGERY  04/2011   ligation  . MASTECTOMY Left 02/15/2017  . PORT-A-CATH REMOVAL N/A 04/16/2014   Procedure: REMOVAL PORT-A-CATH;  Surgeon: Stark Klein, MD;  Location: [facility];  Service: General;  Laterality: N/A;  . PORTACATH PLACEMENT Right 02/04/2013   Procedure: INSERTION PORT-A-CATH;  Surgeon: Stark Klein, MD;  Location: [facility];  Service:  General;  Laterality: Right;  Start Time: 4825.  Marland Kitchen SHOULDER ARTHROSCOPY WITH ROTATOR CUFF REPAIR AND SUBACROMIAL DECOMPRESSION Left 02/24/2014   Procedure: SHOULDER ARTHROSCOPY WITH ROTATOR CUFF REPAIR AND SUBACROMIAL DECOMPRESSION;  Surgeon: Meredith Pel, MD;  Location: [facility];  Service: Orthopedics;  Laterality: Left;  LEFT SHOULDER DIAGNOSTIC OPERATIVE ARTHROSCOPY, SUBACROMIAL DECOMPRESSION, ROTATOR CUFF TEAR REPAIR  . SIMPLE MASTECTOMY WITH AXILLARY SENTINEL NODE BIOPSY Left 02/15/2017   Procedure: LEFT MASTECTOMY;  Surgeon: Stark Klein, MD;  Location: [facility];  Service: General;  Laterality: Left;   Social History   Occupational History  . 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 Smoking status: Never Smoker  . Smokeless tobacco: Never Used  Substance and Sexual Activity  . Alcohol use: No  . Drug use: No  . Sexual activity: Never    Comment: lives with husband, disability/retirement. [employer], no dietary restrictions

## 2018-03-12 ENCOUNTER — Ambulatory Visit (INDEPENDENT_AMBULATORY_CARE_PROVIDER_SITE_OTHER): Payer: Medicare Other | Admitting: Orthopaedic Surgery

## 2018-03-12 ENCOUNTER — Encounter (INDEPENDENT_AMBULATORY_CARE_PROVIDER_SITE_OTHER): Payer: Self-pay | Admitting: Orthopaedic Surgery

## 2018-03-12 DIAGNOSIS — M79645 Pain in left finger(s): Secondary | ICD-10-CM | POA: Diagnosis not present

## 2018-03-12 NOTE — Progress Notes (Signed)
Office Visit Note   Patient: Kristi [name]           Date of Birth: Apr 27, 1956           MRN: [id number] Visit Date: 03/12/2018              Requested by: Mosie Lukes, MD [address] [place], [place] [address] PCP: Mosie Lukes, MD   Assessment & Plan: Visit Diagnoses:  1. Pain in left finger(s)     Plan: Nerve conduction studies were negative for carpal tunnel syndrome or any other peripheral neuropathy.  At this point patient would like to try a nighttime carpal tunnel bracing.  We discussed cortisone injection which she declined.  Follow-Up Instructions: Return if symptoms worsen or fail to improve.   Orders:  No orders of the defined types were placed in this encounter.  No orders of the defined types were placed in this encounter.     Procedures: No procedures performed   Clinical Data: No additional findings.   Subjective: Chief Complaint  Patient presents with  . Left Hand - Follow-up, Numbness    S/p NCS    Patient comes in today for her nerve conduction study review.  She continues to have numbness and tingling occasionally in her fingertips.   Review of Systems   Objective: Vital Signs: LMP 01/20/2013   Physical Exam  Ortho Exam Left hand exam is unremarkable. Specialty Comments:  No specialty comments available.  Imaging: No results found.   PMFS History: Patient Active Problem List   Diagnosis Date Noted  . Pain in left hand 02/18/2018  . Neuropathic pain 12/13/2017  . Anemia 05/14/2017  . Hyperlipidemia 05/14/2017  . History of left breast cancer 02/15/2017  . Nodule of finger of left hand 11/14/2016  . Right hip pain 11/14/2016  . Chronic pain of both knees 11/14/2016  . Constipation 11/13/2016  . Preventative health care 11/13/2016  . HTN (hypertension) 11/13/2016  . Hyperglycemia 01/09/2016  . Neuropathy of hand, left 09/21/2015  . History of chicken pox   . Shingles 05/15/2014  . Anxiety and  depression 05/15/2014  . Rotator cuff tear 02/24/2014  . Left shoulder pain with abduction 12/12/2013  . CVA (cerebral infarction) 07/18/2013  . TIA (transient ischemic attack) 07/18/2013  . Contracture of axilla 05/20/2013  . Primary cancer of upper inner quadrant of left female breast ([place]) 01/24/2013  . Anal fissure 05/03/2011  . Hemorrhoids, internal, with prolapse 05/03/2011   Past Medical History:  Diagnosis Date  . Anemia    Iron deficinecy anemia  . Anemia 05/14/2017  . Anxiety   . Anxiety and depression 05/15/2014  . Arthritis   . Breast cancer ([place])    left ,last radiation 2'15, last chemo 8'14  . Constipation 11/13/2016  . Depression   . History of chicken pox   . History of radiation therapy 09/09/13-10/28/13   45 gray to left breast, lumpectomy cavity boosted to 63 gray  . HTN (hypertension) 11/13/2016  . Hyperglycemia 01/09/2016  . Hyperlipidemia 05/14/2017  . Hypertension   . MRSA (methicillin resistant Staphylococcus aureus) 2009   right groin area-no issues now. 04-07-14 PCR screen negative today.  . Neuropathy   . Preventative health care 11/13/2016    Family History  Problem Relation Age of Onset  . Lung cancer Father   . Hypertension Father   . Thyroid cancer Father        dx in his 70s  .  Cancer Father        lung, thyroid, smoker  . Breast cancer Paternal Aunt 84  . Colon cancer Paternal Aunt        dx in her 50x  . Cervical cancer Paternal Aunt        dzx in her 44s  . Ovarian cancer Cousin        dx in her lage 63s  . Breast cancer Cousin        maternal first cousin, once removed; dx in her late 26s  . Breast cancer Cousin        maternal first cousin once removed; dx in late 73s  . Hypertension Mother   . Diabetes Mother   . Dementia Mother   . Hypertension Brother   . Seizures Brother        Alcohol induced.  . Alcohol abuse Brother        drinker, smoker  . Cancer Paternal Uncle        oral cancer  . Kidney cancer Paternal Grandmother   .  Arthritis Daughter        back surgery  . Cancer Cousin        several paternal cousins with brain cancer, leukemia, and other cancers    Past Surgical History:  Procedure Laterality Date  . ANAL SPHINCTEROTOMY  04/2011  . APPENDECTOMY  1980  . AXILLARY LYMPH NODE DISSECTION Left 02/04/2013   Procedure: LEFT AXILLARY LYMPH NODE DISSECTION;  Surgeon: Stark Klein, MD;  Location: [facility];  Service: General;  Laterality: Left;  End: 1497  . BREAST LUMPECTOMY WITH NEEDLE LOCALIZATION Left 02/04/2013   Procedure: LEFT BREAST LUMPECTOMY WITH NEEDLE LOCALIZATION;  Surgeon: Stark Klein, MD;  Location: [facility];  Service: General;  Laterality: Left;  . BREAST SURGERY     Lumpectomy in april 2014  . HEMORRHOID SURGERY  04/2011   ligation  . MASTECTOMY Left 02/15/2017  . PORT-A-CATH REMOVAL N/A 04/16/2014   Procedure: REMOVAL PORT-A-CATH;  Surgeon: Stark Klein, MD;  [facility];  Service: General;  Laterality: N/A;  . PORTACATH PLACEMENT Right 02/04/2013   Procedure: INSERTION PORT-A-CATH;  Surgeon: Stark Klein, MD;  Location: [facility];  Service: General;  Laterality: Right;  Start Time: 0263.  Marland Kitchen SHOULDER ARTHROSCOPY WITH ROTATOR CUFF REPAIR AND SUBACROMIAL DECOMPRESSION Left 02/24/2014   Procedure: SHOULDER ARTHROSCOPY WITH ROTATOR CUFF REPAIR AND SUBACROMIAL DECOMPRESSION;  Surgeon: Meredith Pel, MD;  Location: [facility];  Service: Orthopedics;  Laterality: Left;  LEFT SHOULDER DIAGNOSTIC OPERATIVE ARTHROSCOPY, SUBACROMIAL DECOMPRESSION, ROTATOR CUFF TEAR REPAIR  . SIMPLE MASTECTOMY WITH AXILLARY SENTINEL NODE BIOPSY Left 02/15/2017   Procedure: LEFT MASTECTOMY;  Surgeon: Stark Klein, MD;  Location: [facility];  Service: General;  Laterality: Left;   Social History   Occupational History  . 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 Smoking status: Never Smoker  . Smokeless tobacco: Never Used  Substance and Sexual Activity  . Alcohol use: No  . Drug use: No    . Sexual activity: Never    Comment: lives with husband, disability/retirement. [employer], no dietary restrictions

## 2018-04-09 DIAGNOSIS — F331 Major depressive disorder, recurrent, moderate: Secondary | ICD-10-CM | POA: Diagnosis not present

## 2018-04-18 ENCOUNTER — Other Ambulatory Visit: Payer: Self-pay

## 2018-04-18 DIAGNOSIS — M792 Neuralgia and neuritis, unspecified: Secondary | ICD-10-CM

## 2018-04-18 MED ORDER — VENLAFAXINE HCL ER 37.5 MG PO CP24: 37.5000 mg | ORAL_CAPSULE | Freq: Every day | ORAL | 1 refills | Status: DC

## 2018-04-24 DIAGNOSIS — F331 Major depressive disorder, recurrent, moderate: Secondary | ICD-10-CM | POA: Diagnosis not present

## 2018-04-30 ENCOUNTER — Encounter: Payer: Self-pay | Admitting: Family Medicine

## 2018-04-30 ENCOUNTER — Ambulatory Visit (INDEPENDENT_AMBULATORY_CARE_PROVIDER_SITE_OTHER): Payer: Medicare Other | Admitting: *Deleted

## 2018-04-30 ENCOUNTER — Other Ambulatory Visit: Payer: Self-pay | Admitting: Family Medicine

## 2018-04-30 ENCOUNTER — Encounter: Payer: Self-pay | Admitting: *Deleted

## 2018-04-30 VITALS — BP 124/70 | HR 92 | Ht 62.0 in | Wt 184.8 lb

## 2018-04-30 DIAGNOSIS — Z0000 Encounter for general adult medical examination without abnormal findings: Secondary | ICD-10-CM | POA: Diagnosis not present

## 2018-04-30 MED ORDER — LIDOCAINE-PRILOCAINE (BULK) 2.5-2.5 % CREA: 1.0000 | TOPICAL_CREAM | Freq: Two times a day (BID) | 2 refills | Status: DC | PRN

## 2018-04-30 NOTE — Progress Notes (Addendum)
Subjective:   Kristi [name] is a 62 y.o. female who presents for an Initial Medicare Annual Wellness Visit.  Review of Systems  No ROS.  Medicare Wellness Visit. Additional risk factors are reflected in the social history. Cardiac Risk Factors include: advanced age (>3men, >61 women);dyslipidemia;hypertension Sleep patterns:  Sleeps about 7 hrs. Feels rested.  Home Safety/Smoke Alarms: Feels safe in home. Smoke alarms in place.  Living environment; residence and Firearm Safety: 2 story home. Lives with husband.   Female:   Pap- pt states she will do in Aug.      Mammo-  Reports last in March 2019 at [facility]. Normal in Right. Left breast mastectomy.     Dexa scan-  declines      [facility]- per pt- last 11/06/14- recall 5 yrs Eye- wears glasses.Reports UTD with yearly exam.    Objective:    Today's Vitals   04/30/18 1108  BP: 124/70  Pulse: 92  SpO2: 95%  Weight: 184 lb 12.8 oz (83.8 kg)  Height: 5\' 2"  (1.575 m)   Body mass index is 33.8 kg/m.  Advanced Directives 04/30/2018 05/16/2017 04/03/2017 02/15/2017 02/14/2017 01/22/2017 01/03/2017  Does Patient Have a Medical Advance Directive? No No No No No No No  Would patient like information on creating a medical advance directive? Yes (MAU/Ambulatory/Procedural Areas - Information given) - No - Patient declined No - Patient declined No - Patient declined - No - Patient declined  Pre-existing out of facility DNR order (yellow form or pink MOST form) - - - - - - -    Current Medications (verified) Outpatient Encounter Medications as of 04/30/2018  Medication Sig  . amitriptyline (ELAVIL) 100 MG tablet Take 100 mg by mouth at bedtime.  Marland Kitchen anastrozole (ARIMIDEX) 1 MG tablet TAKE 1 TABLET (1 MG) BY MOUTH DAILY IN THE MORNING  . Ascorbic Acid (VITAMIN C) 100 MG tablet Take 100 mg by mouth daily.  Marland Kitchen aspirin EC 81 MG tablet Take 81 mg by mouth daily at 3 pm.   . B Complex-C (SUPER B COMPLEX PO) Take 1 tablet by mouth daily.  . clonazePAM  (KLONOPIN) 1 MG tablet TAKE 1 TABLET BY MOUTH EVERY MORNING AND 1/2 TABLET AT BEDTIME  . Cyanocobalamin (VITAMIN B-12) 5000 MCG SUBL Place 5,000 mcg under the tongue daily.  Marland Kitchen gabapentin (NEURONTIN) 800 MG tablet Take 1 tablet (800 mg total) by mouth 3 (three) times daily.  Marland Kitchen HYDROcodone-acetaminophen (NORCO/VICODIN) 5-325 MG tablet Take 1-2 tablets by mouth every 6 (six) hours as needed. For neuropathy/pain.  . hydrOXYzine (VISTARIL) 25 MG capsule TAKE ONE CAPSULE BY MOUTH 3 TIMES A DAY AS NEEDED  . KLOR-CON M20 20 MEQ tablet TAKE 1 TABLET BY MOUTH EVERY DAY  . LATUDA 80 MG TABS tablet TAKE 1 TABLET BY MOUTH EVERY DAY [facility]  . linaclotide (LINZESS) 145 MCG CAPS capsule Take 1 capsule (145 mcg total) by mouth daily before breakfast.  . valsartan-hydrochlorothiazide (DIOVAN-HCT) 320-25 MG tablet TAKE 1 TABLET BY MOUTH EVERY DAY IN THE MORNING  . Multiple Vitamin (MULTIVITAMIN WITH MINERALS) TABS tablet Take 1 tablet by mouth daily.  Marland Kitchen venlafaxine XR (EFFEXOR-XR) 37.5 MG 24 hr capsule Take 1 capsule (37.5 mg total) by mouth daily with breakfast. (Patient not taking: Reported on 04/30/2018)  . [DISCONTINUED] potassium chloride SA (KLOR-CON M20) 20 MEQ tablet Take 1 tablet (20 mEq total) by mouth daily.  . [DISCONTINUED] prochlorperazine (COMPAZINE) 10 MG tablet Take 1 tablet (10 mg total) by mouth every 6 (six)  hours as needed (Nausea or vomiting).  . [DISCONTINUED] prochlorperazine (COMPAZINE) 25 MG suppository Place 1 suppository (25 mg total) rectally every 12 (twelve) hours as needed for nausea.   No facility-administered encounter medications on file as of 04/30/2018.     Allergies (verified) No known allergies   History: Past Medical History:  Diagnosis Date  . Anemia    Iron deficinecy anemia  . Anemia 05/14/2017  . Anxiety   . Anxiety and depression 05/15/2014  . Arthritis   . Breast cancer ([place])    left ,last radiation 2'15, last chemo 8'14  . Constipation 11/13/2016  . Depression   .  History of chicken pox   . History of radiation therapy 09/09/13-10/28/13   45 gray to left breast, lumpectomy cavity boosted to 63 gray  . HTN (hypertension) 11/13/2016  . Hyperglycemia 01/09/2016  . Hyperlipidemia 05/14/2017  . Hypertension   . MRSA (methicillin resistant Staphylococcus aureus) 2009   right groin area-no issues now. 04-07-14 PCR screen negative today.  . Neuropathy   . Preventative health care 11/13/2016   Past Surgical History:  Procedure Laterality Date  . ANAL SPHINCTEROTOMY  04/2011  . APPENDECTOMY  1980  . AXILLARY LYMPH NODE DISSECTION Left 02/04/2013   Procedure: LEFT AXILLARY LYMPH NODE DISSECTION;  Surgeon: Stark Klein, MD;  Location: [place];  Service: General;  Laterality: Left;  End: 6294  . BREAST LUMPECTOMY WITH NEEDLE LOCALIZATION Left 02/04/2013   Procedure: LEFT BREAST LUMPECTOMY WITH NEEDLE LOCALIZATION;  Surgeon: Stark Klein, MD;  Location: [place];  Service: General;  Laterality: Left;  . BREAST SURGERY     Lumpectomy in april 2014  . HEMORRHOID SURGERY  04/2011   ligation  . MASTECTOMY Left 02/15/2017  . PORT-A-CATH REMOVAL N/A 04/16/2014   Procedure: REMOVAL PORT-A-CATH;  Surgeon: Stark Klein, MD;  Location: [facility];  Service: General;  Laterality: N/A;  . PORTACATH PLACEMENT Right 02/04/2013   Procedure: INSERTION PORT-A-CATH;  Surgeon: Stark Klein, MD;  Location: [place];  Service: General;  Laterality: Right;  Start Time: 7654.  Marland Kitchen SHOULDER ARTHROSCOPY WITH ROTATOR CUFF REPAIR AND SUBACROMIAL DECOMPRESSION Left 02/24/2014   Procedure: SHOULDER ARTHROSCOPY WITH ROTATOR CUFF REPAIR AND SUBACROMIAL DECOMPRESSION;  Surgeon: Meredith Pel, MD;  Location: [place];  Service: Orthopedics;  Laterality: Left;  LEFT SHOULDER DIAGNOSTIC OPERATIVE ARTHROSCOPY, SUBACROMIAL DECOMPRESSION, ROTATOR CUFF TEAR REPAIR  . SIMPLE MASTECTOMY WITH AXILLARY SENTINEL NODE BIOPSY Left 02/15/2017   Procedure: LEFT MASTECTOMY;  Surgeon: Stark Klein, MD;  Location[facility];  Service: General;   Laterality: Left;   Family History  Problem Relation Age of Onset  . Lung cancer Father   . Hypertension Father   . Thyroid cancer Father        dx in his 37s  . Cancer Father        lung, thyroid, smoker  . Breast cancer Paternal Aunt 54  . Colon cancer Paternal Aunt        dx in her 50x  . Cervical cancer Paternal Aunt        dzx in her 54s  . Ovarian cancer Cousin        dx in her lage 25s  . Breast cancer Cousin        maternal first cousin, once removed; dx in her late 17s  . Breast cancer Cousin        maternal first cousin once removed; dx in late 67s  . Hypertension Mother   . Diabetes Mother   .  Dementia Mother   . Hypertension Brother   . Seizures Brother        Alcohol induced.  . Alcohol abuse Brother        drinker, smoker  . Cancer Paternal Uncle        oral cancer  . Kidney cancer Paternal Grandmother   . Arthritis Daughter        back surgery  . Cancer Cousin        several paternal cousins with brain cancer, leukemia, and other cancers   Social History   Socioeconomic History  . Marital status: Married    Spouse name: [name]  . Number of children: 1  . Years of education: 1  . Highest education level: Not on file  Occupational History  . Occupation: INVENTORY Hotel manager: [employer]  Social Needs  . Financial resource strain: Not on file  . Food insecurity:    Worry: Not on file    Inability: Not on file  . Transportation needs:    Medical: Not on file    Non-medical: Not on file  Tobacco Use  . Smoking status: Never Smoker  . Smokeless tobacco: Never Used  Substance and Sexual Activity  . Alcohol use: No  . Drug use: No  . Sexual activity: Yes    Comment: lives with husband, disability/retirement. [employer], no dietary restrictions  Lifestyle  . Physical activity:    Days per week: Not on file    Minutes per session: Not on file  . Stress: Not on file  Relationships  . Social connections:    Talks  on phone: Not on file    Gets together: Not on file    Attends religious service: Not on file    Active member of club or organization: Not on file    Attends meetings of clubs or organizations: Not on file    Relationship status: Not on file  Other Topics Concern  . Not on file  Social History Narrative   Patient is married [name]) and lives at home with her husband.   Patient has one daughter.   Patient is working at [employer]   Patient has a 12th grade education.   Patient drinks very little caffeine.    Tobacco Counseling Counseling given: Not Answered   Clinical Intake: Pain : No/denies pain     Activities of Daily Living In your present state of health, do you have any difficulty performing the following activities: 04/30/2018  Hearing? N  Vision? N  Difficulty concentrating or making decisions? N  Walking or climbing stairs? N  Dressing or bathing? N  Doing errands, shopping? N  Preparing Food and eating ? N  Using the Toilet? N  Do you have problems with loss of bowel control? N  Managing your Medications? N  Managing your Finances? N  Housekeeping or managing your Housekeeping? N  Some recent data might be hidden     Immunizations and Health Maintenance  There is no immunization history on file for this patient. Health Maintenance Due  Topic Date Due  . Hepatitis C Screening  [birth year]  . HIV Screening  01/27/1970  . TETANUS/TDAP  01/27/1974    Patient Care Team: Mosie Lukes, MD as PCP - General (Family Medicine) Nicholas Lose, MD as Consulting Physician (Hematology and Oncology) Lin Landsman, MD as Consulting Physician (Family Medicine) Inocencio Homes, DPM as Consulting Physician (Podiatry) Gery Pray, MD as Consulting Physician (Radiation Oncology)  Dan Humphreys as Consulting Physician (Nurse Practitioner)  Indicate any recent Medical Services you may have received from other than [facility] providers in the past year (date may be  approximate).     Assessment:   This is a routine wellness examination for [place]. Physical assessment deferred to PCP.  Hearing/Vision screen  Visual Acuity Screening   Right eye Left eye Both eyes  Without correction:     With correction: 20/20 20/20 20/20   Hearing Screening Comments: Able to hear conversational tones w/o difficulty. No issues reported.    Dietary issues and exercise activities discussed: Current Exercise Habits: The patient does not participate in regular exercise at present, Exercise limited by: neurologic condition(s);None identified Diet (meal preparation, eat out, water intake, caffeinated beverages, dairy products, fruits and vegetables): 24 hour recall Breakfast: coffee, water Lunch: skips Dinner:  S[place]kraut, sausages, and cornbread.  Goals    . Increase physical activity      Depression Screen PHQ 2/9 Scores 04/30/2018 10/12/2016  PHQ - 2 Score 1 0  Some encounter information is confidential and restricted. Go to Review Flowsheets activity to see all data.    Fall Risk Fall Risk  04/30/2018 10/12/2016 09/22/2014 05/28/2014  Falls in the past year? No No Yes Yes  Number falls in past yr: - - - 1  Injury with Fall? - - No No   Cognitive Function: MMSE - Mini Mental State Exam 04/30/2018  Orientation to time 5  Orientation to Place 5  Registration 3  Attention/ Calculation 5  Recall 1  Language- name 2 objects 2  Language- repeat 1  Language- follow 3 step command 3  Language- read & follow direction 1  Write a sentence 1  Copy design 1  Total score 28        Screening Tests Health Maintenance  Topic Date Due  . Hepatitis C Screening  [birth year]  . HIV Screening  01/27/1970  . TETANUS/TDAP  01/27/1974  . INFLUENZA VACCINE  08/29/2018 (Originally 06/06/2018)  . PAP SMEAR  05/16/2018  . MAMMOGRAM  02/02/2019  . COLONOSCOPY  11/06/2024     Plan:     Follow up with Dr.Blyth as scheduled.  Please schedule your next medicare wellness  visit with me in 1 yr.  Continue to eat heart healthy diet (full of fruits, vegetables, whole grains, lean protein, water--limit salt, fat, and sugar intake) and increase physical activity as tolerated.  Continue doing brain stimulating activities (puzzles, reading, adult coloring books, staying active) to keep memory sharp.     I have personally reviewed and noted the following in the patient's chart:   . Medical and social history . Use of alcohol, tobacco or illicit drugs  . Current medications and supplements . Functional ability and status . Nutritional status . Physical activity . Advanced directives . List of other physicians . Hospitalizations, surgeries, and ER visits in previous 12 months . Vitals . Screenings to include cognitive, depression, and falls . Referrals and appointments  In addition, I have reviewed and discussed with patient certain preventive protocols, quality metrics, and best practice recommendations. A written personalized care plan for preventive services as well as general preventive health recommendations were provided to patient.     Shela Nevin, [place]   04/30/2018    Medical screening examination/treatment was performed by qualified clinical staff member and as supervising physician I was immediately available for consultation/collaboration. I have reviewed documentation and agree with assessment and plan.  [name], MD

## 2018-04-30 NOTE — Patient Instructions (Signed)
Follow up with Dr.Blyth as scheduled.  Please schedule your next medicare wellness visit with me in 1 yr.  Continue to eat heart healthy diet (full of fruits, vegetables, whole grains, lean protein, water--limit salt, fat, and sugar intake) and increase physical activity as tolerated.  Continue doing brain stimulating activities (puzzles, reading, adult coloring books, staying active) to keep memory sharp.   Kristi [name] , Thank you for taking time to come for your Medicare Wellness Visit. I appreciate your ongoing commitment to your health goals. Please review the following plan we discussed and let me know if I can assist you in the future.   These are the goals we discussed: Goals    . Increase physical activity       This is a list of the screening recommended for you and due dates:  Health Maintenance  Topic Date Due  .  Hepatitis C: One time screening is recommended by Center for Disease Control  (CDC) for  adults born from [birth year] through 1966.   03-Feb-1956  . HIV Screening  01/27/1970  . Tetanus Vaccine  01/27/1974  . Flu Shot  08/29/2018*  . Pap Smear  05/16/2018  . Mammogram  02/02/2019  . Colon Cancer Screening  11/06/2024  *Topic was postponed. The date shown is not the original due date.     Health Maintenance for Postmenopausal Women Menopause is a normal process in which your reproductive ability comes to an end. This process happens gradually over a span of months to years, usually between the ages of 63 and 24. Menopause is complete when you have missed 12 consecutive menstrual periods. It is important to talk with your health care provider about some of the most common conditions that affect postmenopausal women, such as heart disease, cancer, and bone loss (osteoporosis). Adopting a healthy lifestyle and getting preventive care can help to promote your health and wellness. Those actions can also lower your chances of developing some of these common conditions. What  should I know about menopause? During menopause, you may experience a number of symptoms, such as:  Moderate-to-severe hot flashes.  Night sweats.  Decrease in sex drive.  Mood swings.  Headaches.  Tiredness.  Irritability.  Memory problems.  Insomnia.  Choosing to treat or not to treat menopausal changes is an individual decision that you make with your health care provider. What should I know about hormone replacement therapy and supplements? Hormone therapy products are effective for treating symptoms that are associated with menopause, such as hot flashes and night sweats. Hormone replacement carries certain risks, especially as you become older. If you are thinking about using estrogen or estrogen with progestin treatments, discuss the benefits and risks with your health care provider. What should I know about heart disease and stroke? Heart disease, heart attack, and stroke become more likely as you age. This may be due, in part, to the hormonal changes that your body experiences during menopause. These can affect how your body processes dietary fats, triglycerides, and cholesterol. Heart attack and stroke are both medical emergencies. There are many things that you can do to help prevent heart disease and stroke:  Have your blood pressure checked at least every 1-2 years. High blood pressure causes heart disease and increases the risk of stroke.  If you are 63-33 years old, ask your health care provider if you should take aspirin to prevent a heart attack or a stroke.  Do not use any tobacco products, including cigarettes, chewing tobacco, or  electronic cigarettes. If you need help quitting, ask your health care provider.  It is important to eat a healthy diet and maintain a healthy weight. ? Be sure to include plenty of vegetables, fruits, low-fat dairy products, and lean protein. ? Avoid eating foods that are high in solid fats, added sugars, or salt (sodium).  Get  regular exercise. This is one of the most important things that you can do for your health. ? Try to exercise for at least 150 minutes each week. The type of exercise that you do should increase your heart rate and make you sweat. This is known as moderate-intensity exercise. ? Try to do strengthening exercises at least twice each week. Do these in addition to the moderate-intensity exercise.  Know your numbers.Ask your health care provider to check your cholesterol and your blood glucose. Continue to have your blood tested as directed by your health care provider.  What should I know about cancer screening? There are several types of cancer. Take the following steps to reduce your risk and to catch any cancer development as early as possible. Breast Cancer  Practice breast self-awareness. ? This means understanding how your breasts normally appear and feel. ? It also means doing regular breast self-exams. Let your health care provider know about any changes, no matter how small.  If you are 63 or older, have a clinician do a breast exam (clinical breast exam or CBE) every year. Depending on your age, family history, and medical history, it may be recommended that you also have a yearly breast X-ray (mammogram).  If you have a family history of breast cancer, talk with your health care provider about genetic screening.  If you are at high risk for breast cancer, talk with your health care provider about having an MRI and a mammogram every year.  Breast cancer (BRCA) gene test is recommended for women who have family members with BRCA-related cancers. Results of the assessment will determine the need for genetic counseling and BRCA1 and for BRCA2 testing. BRCA-related cancers include these types: ? Breast. This occurs in males or females. ? Ovarian. ? Tubal. This may also be called fallopian tube cancer. ? Cancer of the abdominal or pelvic lining (peritoneal  cancer). ? Prostate. ? Pancreatic.  Cervical, Uterine, and Ovarian Cancer Your health care provider may recommend that you be screened regularly for cancer of the pelvic organs. These include your ovaries, uterus, and vagina. This screening involves a pelvic exam, which includes checking for microscopic changes to the surface of your cervix (Pap test).  For women ages 21-65, health care providers may recommend a pelvic exam and a Pap test every three years. For women ages 25-65, they may recommend the Pap test and pelvic exam, combined with testing for human papilloma virus (HPV), every five years. Some types of HPV increase your risk of cervical cancer. Testing for HPV may also be done on women of any age who have unclear Pap test results.  Other health care providers may not recommend any screening for nonpregnant women who are considered low risk for pelvic cancer and have no symptoms. Ask your health care provider if a screening pelvic exam is right for you.  If you have had past treatment for cervical cancer or a condition that could lead to cancer, you need Pap tests and screening for cancer for at least 20 years after your treatment. If Pap tests have been discontinued for you, your risk factors (such as having a new sexual  partner) need to be reassessed to determine if you should start having screenings again. Some women have medical problems that increase the chance of getting cervical cancer. In these cases, your health care provider may recommend that you have screening and Pap tests more often.  If you have a family history of uterine cancer or ovarian cancer, talk with your health care provider about genetic screening.  If you have vaginal bleeding after reaching menopause, tell your health care provider.  There are currently no reliable tests available to screen for ovarian cancer.  Lung Cancer Lung cancer screening is recommended for adults 1-3 years old who are at high risk for  lung cancer because of a history of smoking. A yearly low-dose CT scan of the lungs is recommended if you:  Currently smoke.  Have a history of at least 30 pack-years of smoking and you currently smoke or have quit within the past 15 years. A pack-year is smoking an average of one pack of cigarettes per day for one year.  Yearly screening should:  Continue until it has been 15 years since you quit.  Stop if you develop a health problem that would prevent you from having lung cancer treatment.  Colorectal Cancer  This type of cancer can be detected and can often be prevented.  Routine colorectal cancer screening usually begins at age 30 and continues through age 62.  If you have risk factors for colon cancer, your health care provider may recommend that you be screened at an earlier age.  If you have a family history of colorectal cancer, talk with your health care provider about genetic screening.  Your health care provider may also recommend using home test kits to check for hidden blood in your stool.  A small camera at the end of a tube can be used to examine your colon directly (sigmoidoscopy or colonoscopy). This is done to check for the earliest forms of colorectal cancer.  Direct examination of the colon should be repeated every 5-10 years until age 15. However, if early forms of precancerous polyps or small growths are found or if you have a family history or genetic risk for colorectal cancer, you may need to be screened more often.  Skin Cancer  Check your skin from head to toe regularly.  Monitor any moles. Be sure to tell your health care provider: ? About any new moles or changes in moles, especially if there is a change in a mole's shape or color. ? If you have a mole that is larger than the size of a pencil eraser.  If any of your family members has a history of skin cancer, especially at a young age, talk with your health care provider about genetic  screening.  Always use sunscreen. Apply sunscreen liberally and repeatedly throughout the day.  Whenever you are outside, protect yourself by wearing long sleeves, pants, a wide-brimmed hat, and sunglasses.  What should I know about osteoporosis? Osteoporosis is a condition in which bone destruction happens more quickly than new bone creation. After menopause, you may be at an increased risk for osteoporosis. To help prevent osteoporosis or the bone fractures that can happen because of osteoporosis, the following is recommended:  If you are 67-13 years old, get at least 1,000 mg of calcium and at least 600 mg of vitamin D per day.  If you are older than age 71 but younger than age 53, get at least 1,200 mg of calcium and at least 600 mg  of vitamin D per day.  If you are older than age 46, get at least 1,200 mg of calcium and at least 800 mg of vitamin D per day.  Smoking and excessive alcohol intake increase the risk of osteoporosis. Eat foods that are rich in calcium and vitamin D, and do weight-bearing exercises several times each week as directed by your health care provider. What should I know about how menopause affects my mental health? Depression may occur at any age, but it is more common as you become older. Common symptoms of depression include:  Low or sad mood.  Changes in sleep patterns.  Changes in appetite or eating patterns.  Feeling an overall lack of motivation or enjoyment of activities that you previously enjoyed.  Frequent crying spells.  Talk with your health care provider if you think that you are experiencing depression. What should I know about immunizations? It is important that you get and maintain your immunizations. These include:  Tetanus, diphtheria, and pertussis (Tdap) booster vaccine.  Influenza every year before the flu season begins.  Pneumonia vaccine.  Shingles vaccine.  Your health care provider may also recommend other  immunizations. This information is not intended to replace advice given to you by your health care provider. Make sure you discuss any questions you have with your health care provider. Document Released: 12/15/2005 Document Revised: 05/12/2016 Document Reviewed: 07/27/2015 Elsevier Interactive Patient Education  2018 [employer].

## 2018-05-09 ENCOUNTER — Other Ambulatory Visit: Payer: Self-pay | Admitting: Hematology and Oncology

## 2018-05-09 DIAGNOSIS — C50212 Malignant neoplasm of upper-inner quadrant of left female breast: Secondary | ICD-10-CM

## 2018-05-16 ENCOUNTER — Telehealth: Payer: Self-pay | Admitting: Hematology and Oncology

## 2018-05-16 ENCOUNTER — Inpatient Hospital Stay: Payer: Medicare Other | Attending: Hematology and Oncology | Admitting: Hematology and Oncology

## 2018-05-16 DIAGNOSIS — M25561 Pain in right knee: Secondary | ICD-10-CM | POA: Diagnosis not present

## 2018-05-16 DIAGNOSIS — F329 Major depressive disorder, single episode, unspecified: Secondary | ICD-10-CM | POA: Insufficient documentation

## 2018-05-16 DIAGNOSIS — G620 Drug-induced polyneuropathy: Secondary | ICD-10-CM | POA: Diagnosis not present

## 2018-05-16 DIAGNOSIS — Z923 Personal history of irradiation: Secondary | ICD-10-CM | POA: Insufficient documentation

## 2018-05-16 DIAGNOSIS — C50212 Malignant neoplasm of upper-inner quadrant of left female breast: Secondary | ICD-10-CM | POA: Diagnosis not present

## 2018-05-16 DIAGNOSIS — C773 Secondary and unspecified malignant neoplasm of axilla and upper limb lymph nodes: Secondary | ICD-10-CM | POA: Diagnosis not present

## 2018-05-16 DIAGNOSIS — I890 Lymphedema, not elsewhere classified: Secondary | ICD-10-CM | POA: Insufficient documentation

## 2018-05-16 DIAGNOSIS — Z79811 Long term (current) use of aromatase inhibitors: Secondary | ICD-10-CM | POA: Insufficient documentation

## 2018-05-16 DIAGNOSIS — Z9221 Personal history of antineoplastic chemotherapy: Secondary | ICD-10-CM | POA: Diagnosis not present

## 2018-05-16 DIAGNOSIS — Z170 Estrogen receptor positive status [ER+]: Secondary | ICD-10-CM | POA: Diagnosis not present

## 2018-05-16 DIAGNOSIS — M25562 Pain in left knee: Secondary | ICD-10-CM | POA: Diagnosis not present

## 2018-05-16 NOTE — Assessment & Plan Note (Signed)
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  Major depression: Patient is seeing psychiatry. Since she stopped tamoxifen, Her depression symptoms had markedly improved.   Anastrozole toxicities: 1. Musculoskeletal aches and pains especially in the knees: Patient is Trying to do more activity to help alleviate the symptoms. 2. Denies any major problems with depression. Denies any hot flashes.  Breast Cancer Surveillance: 1. Breast exam 05/16/2017: Normal 2. Mammogram 02/01/2017 No abnormalities. Postsurgical changes. Breast Density Category B.   Return to clinic in 1 year for follow-up.

## 2018-05-16 NOTE — Progress Notes (Signed)
Patient Care Team: Mosie Lukes, MD as PCP - General (Family Medicine) Nicholas Lose, MD as Consulting Physician (Hematology and Oncology) Lin Landsman, MD as Consulting Physician (Family Medicine) Inocencio Homes, DPM as Consulting Physician (Podiatry) Gery Pray, MD as Consulting Physician (Radiation Oncology) Dan Humphreys as Consulting Physician (Nurse Practitioner)  DIAGNOSIS:  Encounter Diagnosis  Name Primary?  . Primary cancer of upper inner quadrant of left female breast ([place])     SUMMARY OF ONCOLOGIC HISTORY:   Primary cancer of upper inner quadrant of left female breast ([place])   01/24/2013 Initial Diagnosis    Ultrasound: Left breast no position 1 x 0.7 cm, 12:00 position 1.6 x 0.9 cm in addition 2.1 cm axillary lymph node: Biopsies of breast and axilla were positive for IDC high-grade;  ER weakly positive, PR negative, HER-2 negative      02/04/2013 Surgery    Left breast lumpectomy: 1.6 cm tumor 1/19 axillary lymph nodes positive, ER 6%, PR 0%, HER-2 negative, Ki-67 90%      03/05/2013 - 08/21/2013 Chemotherapy    Adjuvant chemotherapy with Adriamycin and Cytoxan followed by Taxol and [place] x9 followed by [place] x2      09/09/2013 - 10/28/2013 Radiation Therapy    Adjuvant radiation therapy      11/13/2013 -  Anti-estrogen oral therapy    Tamoxifen 20 mg daily switched to anastrozole May 2017       CHIEF COMPLIANT: Follow-up on anastrozole  INTERVAL HISTORY: Kristi [name] is a 62 year old with above-mentioned history of left breast cancer treated with lumpectomy adjuvant chemotherapy and radiation.  She is currently on oral antiestrogen therapy with anastrozole.  She appears to be tolerating anastrozole fairly well except for muscle aches and pains.  Her previous tumor was very low level of ER positivity of 6% and PR was 0%.  Because of this I recommended that she only take antiestrogen therapy for a total of 5 years.  She completes 5 years of the end of  this year.  Her biggest complaint today is word finding difficulties.  She is seeing a psychiatrist for this.  She also has problems with neuropathy which have not improved significantly.  REVIEW OF SYSTEMS:   Constitutional: Denies fevers, chills or abnormal weight loss Eyes: Denies blurriness of vision Ears, nose, mouth, throat, and face: Denies mucositis or sore throat Respiratory: Denies cough, dyspnea or wheezes Cardiovascular: Denies palpitation, chest discomfort Gastrointestinal:  Denies nausea, heartburn or change in bowel habits Skin: Denies abnormal skin rashes Lymphatics: Denies new lymphadenopathy or easy bruising Neurological: Severe peripheral neuropathy Behavioral/Psych: Word finding difficulty and depression Extremities: No lower extremity edema Breast:  denies any pain or lumps or nodules in either breasts All other systems were reviewed with the patient and are negative.  I have reviewed the past medical history, past surgical history, social history and family history with the patient and they are unchanged from previous note.  ALLERGIES:  is allergic to no known allergies.  MEDICATIONS:  Current Outpatient Medications  Medication Sig Dispense Refill  . amitriptyline (ELAVIL) 100 MG tablet Take 100 mg by mouth at bedtime.  2  . anastrozole (ARIMIDEX) 1 MG tablet TAKE 1 TABLET (1 MG) BY MOUTH DAILY IN THE MORNING 90 tablet 3  . Ascorbic Acid (VITAMIN C) 100 MG tablet Take 100 mg by mouth daily.    Marland Kitchen aspirin EC 81 MG tablet Take 81 mg by mouth daily at 3 pm.     . B Complex-C (  SUPER B COMPLEX PO) Take 1 tablet by mouth daily.    . clonazePAM (KLONOPIN) 1 MG tablet TAKE 1 TABLET BY MOUTH EVERY MORNING AND 1/2 TABLET AT BEDTIME 45 tablet 1  . Cyanocobalamin (VITAMIN B-12) 5000 MCG SUBL Place 5,000 mcg under the tongue daily.    Marland Kitchen gabapentin (NEURONTIN) 800 MG tablet Take 1 tablet (800 mg total) by mouth 3 (three) times daily. 90 tablet 2  . HYDROcodone-acetaminophen  (NORCO/VICODIN) 5-325 MG tablet Take 1-2 tablets by mouth every 6 (six) hours as needed. For neuropathy/pain. 30 tablet 0  . hydrOXYzine (VISTARIL) 25 MG capsule TAKE ONE CAPSULE BY MOUTH 3 TIMES A DAY AS NEEDED  1  . KLOR-CON M20 20 MEQ tablet TAKE 1 TABLET BY MOUTH EVERY DAY 90 tablet 1  . LATUDA 80 MG TABS tablet TAKE 1 TABLET BY MOUTH EVERY DAY [facility]  1  . Lidocaine-Prilocaine, Bulk, 2.5-2.5 % CREA 1 Dose by Does not apply route 2 (two) times daily as needed. 5800 g 2  . linaclotide (LINZESS) 145 MCG CAPS capsule Take 1 capsule (145 mcg total) by mouth daily before breakfast. 30 capsule 0  . Multiple Vitamin (MULTIVITAMIN WITH MINERALS) TABS tablet Take 1 tablet by mouth daily.    . valsartan-hydrochlorothiazide (DIOVAN-HCT) 320-25 MG tablet TAKE 1 TABLET BY MOUTH EVERY DAY IN THE MORNING 90 tablet 1   No current facility-administered medications for this visit.     PHYSICAL EXAMINATION: ECOG PERFORMANCE STATUS: 1 - Symptomatic but completely ambulatory  Vitals:   05/16/18 1332  BP: 122/76  Pulse: 93  Resp: 18  Temp: 98.2 F (36.8 C)  SpO2: 96%   Filed Weights   05/16/18 1332  Weight: 184 lb 6.4 oz (83.6 kg)    GENERAL:alert, no distress and comfortable SKIN: skin color, texture, turgor are normal, no rashes or significant lesions EYES: normal, Conjunctiva are pink and non-injected, sclera clear OROPHARYNX:no exudate, no erythema and lips, buccal mucosa, and tongue normal  NECK: supple, thyroid normal size, non-tender, without nodularity LYMPH:  no palpable lymphadenopathy in the cervical, axillary or inguinal LUNGS: clear to auscultation and percussion with normal breathing effort HEART: regular rate & rhythm and no murmurs and no lower extremity edema ABDOMEN:abdomen soft, non-tender and normal bowel sounds MUSCULOSKELETAL:no cyanosis of digits and no clubbing  NEURO: alert & oriented x 3 with fluent speech, 2-3+ peripheral neuropathy EXTREMITIES: No lower extremity  edema   LABORATORY DATA:  I have reviewed the data as listed CMP Latest Ref Rng & Units 02/12/2018 05/14/2017 02/16/2017  Glucose 70 - 99 mg/dL 79 94 146(H)  BUN 6 - 23 mg/dL 11 15 10   Creatinine 0.40 - 1.20 mg/dL 1.17 1.09 0.94  Sodium 135 - 145 mEq/L 141 138 139  Potassium 3.5 - 5.1 mEq/L 3.9 4.2 4.1  Chloride 96 - 112 mEq/L 103 101 106  CO2 19 - 32 mEq/L 30 32 26  Calcium 8.4 - 10.5 mg/dL 9.7 10.1 8.9  Total Protein 6.0 - 8.3 g/dL 7.2 7.3 -  Total Bilirubin 0.2 - 1.2 mg/dL 0.3 0.3 -  Alkaline Phos 39 - 117 U/L 70 76 -  AST 0 - 37 U/L 20 19 -  ALT 0 - 35 U/L 16 14 -    Lab Results  Component Value Date   WBC 5.9 02/12/2018   HGB 11.4 (L) 02/12/2018   HCT 35.4 (L) 02/12/2018   MCV 88.1 02/12/2018   PLT 176.0 02/12/2018   NEUTROABS 2.4 01/22/2017    ASSESSMENT &  PLAN:  Primary cancer of upper 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 followed by Taxol carboplatin later switched to [place] completed 08/21/2013, status post adjuvant radiation, currently on tamoxifen started 11/13/2013 Switched to anastrozole 03/10/2016   Left breast lymphedema: received physical therapy Neuropathyrelated to prior chemotherapy: Patient is currently on Neurontin and her symptoms are not improving.  Major depression: Patient is seeing psychiatry. Since she stopped tamoxifen, Her depression symptoms had markedly improved.   Anastrozole toxicities: 1. Musculoskeletal aches and pains especially in the knees: Patient is Trying to do more activity to help alleviate the symptoms. 2. Denies any major problems with depression. Denies any hot flashes. I instructed her that she can stop antiestrogen therapy by December 2019.  She will of completed 5 years of therapy.  Breast  Cancer Surveillance: 1. Breast exam 05/16/2017: Normal 2. Mammogram 02/01/2017 No abnormalities. Postsurgical changes. Breast Density Category B.   Return to clinic in 1 year for follow-up with long-term survivorship.    No orders of the defined types were placed in this encounter.  The patient has a good understanding of the overall plan. she agrees with it. she will call with any problems that may develop before the next visit here.   Harriette Ohara, MD 05/16/18

## 2018-05-16 NOTE — Telephone Encounter (Signed)
Gave patient avs and calendar of upcoming July 2020 appts.  °

## 2018-05-22 ENCOUNTER — Other Ambulatory Visit: Payer: Self-pay | Admitting: Family Medicine

## 2018-05-22 DIAGNOSIS — M792 Neuralgia and neuritis, unspecified: Secondary | ICD-10-CM

## 2018-05-27 NOTE — Progress Notes (Signed)
FMLA successfully faxed to [facility] at [phone]. Mailed copy to patient address on file.

## 2018-06-18 ENCOUNTER — Ambulatory Visit (INDEPENDENT_AMBULATORY_CARE_PROVIDER_SITE_OTHER): Payer: Medicare Other | Admitting: Family Medicine

## 2018-06-18 ENCOUNTER — Other Ambulatory Visit (HOSPITAL_COMMUNITY)
Admission: RE | Admit: 2018-06-18 | Discharge: 2018-06-18 | Disposition: A | Discharge status: Home / Self Care | Payer: Medicare Other | Source: Ambulatory Visit | Attending: Family Medicine | Admitting: Family Medicine

## 2018-06-18 ENCOUNTER — Encounter: Payer: Self-pay | Admitting: Family Medicine

## 2018-06-18 ENCOUNTER — Ambulatory Visit (HOSPITAL_BASED_OUTPATIENT_CLINIC_OR_DEPARTMENT_OTHER)
Admission: RE | Admit: 2018-06-18 | Discharge: 2018-06-18 | Disposition: A | Discharge status: Home / Self Care | Payer: Medicare Other | Source: Ambulatory Visit | Attending: Family Medicine | Admitting: Family Medicine

## 2018-06-18 VITALS — BP 106/78 | HR 80 | Temp 98.1°F | Resp 18 | Ht 62.0 in | Wt 185.6 lb

## 2018-06-18 DIAGNOSIS — M47812 Spondylosis without myelopathy or radiculopathy, cervical region: Secondary | ICD-10-CM | POA: Insufficient documentation

## 2018-06-18 DIAGNOSIS — Z0000 Encounter for general adult medical examination without abnormal findings: Secondary | ICD-10-CM

## 2018-06-18 DIAGNOSIS — Z124 Encounter for screening for malignant neoplasm of cervix: Secondary | ICD-10-CM

## 2018-06-18 DIAGNOSIS — M4802 Spinal stenosis, cervical region: Secondary | ICD-10-CM | POA: Diagnosis not present

## 2018-06-18 DIAGNOSIS — I10 Essential (primary) hypertension: Secondary | ICD-10-CM

## 2018-06-18 DIAGNOSIS — D649 Anemia, unspecified: Secondary | ICD-10-CM

## 2018-06-18 DIAGNOSIS — R200 Anesthesia of skin: Secondary | ICD-10-CM | POA: Diagnosis not present

## 2018-06-18 DIAGNOSIS — R739 Hyperglycemia, unspecified: Secondary | ICD-10-CM

## 2018-06-18 DIAGNOSIS — F32A Depression, unspecified: Secondary | ICD-10-CM

## 2018-06-18 DIAGNOSIS — M50323 Other cervical disc degeneration at C6-C7 level: Secondary | ICD-10-CM | POA: Diagnosis not present

## 2018-06-18 DIAGNOSIS — E785 Hyperlipidemia, unspecified: Secondary | ICD-10-CM | POA: Diagnosis not present

## 2018-06-18 DIAGNOSIS — F329 Major depressive disorder, single episode, unspecified: Secondary | ICD-10-CM

## 2018-06-18 DIAGNOSIS — M50322 Other cervical disc degeneration at C5-C6 level: Secondary | ICD-10-CM | POA: Insufficient documentation

## 2018-06-18 DIAGNOSIS — F419 Anxiety disorder, unspecified: Secondary | ICD-10-CM | POA: Diagnosis not present

## 2018-06-18 HISTORY — DX: Encounter for screening for malignant neoplasm of cervix: Z12.4

## 2018-06-18 NOTE — Progress Notes (Signed)
Subjective:  I acted as a Education administrator for Dr. Charlett Blake. [name], [place]  Patient ID: Kristi [name], female    DOB: 1955-11-11, 62 y.o.   MRN: [id number]  No chief complaint on file.   HPI  Patient is in today for an annual exam and follow up on chronic medical concerns such as hypertension, hyperlipidemia, hyperglycemia a dn depression. She is following with psychiatry and continues to struggle with depression but feels it is adequately controlled. No recent febrile illness or hospitalizations. She is not exercising due to her neuropathy but she is tying to maintain a heart healthy diet. Is doing well with ADLs but she does acknowledge that she lacks motivation often and struggles with anhedonia. No polyuria or polydipsia. Denies CP/palp/SOB/HA/congestion/fevers/GI or GU c/o. Taking meds as prescribed  Patient Care Team: Mosie Lukes, MD as PCP - General (Family Medicine) Nicholas Lose, MD as Consulting Physician (Hematology and Oncology) Lin Landsman, MD as Consulting Physician (Family Medicine) Inocencio Homes, DPM as Consulting Physician (Podiatry) Gery Pray, MD as Consulting Physician (Radiation Oncology) Dan Humphreys as Consulting Physician (Nurse Practitioner)   Past Medical History:  Diagnosis Date  . Anemia    Iron deficinecy anemia  . Anemia 05/14/2017  . Anxiety   . Anxiety and depression 05/15/2014  . Arthritis   . Breast cancer ([place])    left ,last radiation 2'15, last chemo 8'14  . Constipation 11/13/2016  . Depression   . History of chicken pox   . History of radiation therapy 09/09/13-10/28/13   45 gray to left breast, lumpectomy cavity boosted to 63 gray  . HTN (hypertension) 11/13/2016  . Hyperglycemia 01/09/2016  . Hyperlipidemia 05/14/2017  . Hypertension   . MRSA (methicillin resistant Staphylococcus aureus) 2009   right groin area-no issues now. 04-07-14 PCR screen negative today.  . Neuropathy   . Preventative health care 11/13/2016    Past Surgical History:    Procedure Laterality Date  . ANAL SPHINCTEROTOMY  04/2011  . APPENDECTOMY  1980  . AXILLARY LYMPH NODE DISSECTION Left 02/04/2013   Procedure: LEFT AXILLARY LYMPH NODE DISSECTION;  Surgeon: Stark Klein, MD;  Location: [facility];  Service: General;  Laterality: Left;  End: 8333  . BREAST LUMPECTOMY WITH NEEDLE LOCALIZATION Left 02/04/2013   Procedure: LEFT BREAST LUMPECTOMY WITH NEEDLE LOCALIZATION;  Surgeon: Stark Klein, MD;  Location: [facility];  Service: General;  Laterality: Left;  . BREAST SURGERY     Lumpectomy in april 2014  . HEMORRHOID SURGERY  04/2011   ligation  . MASTECTOMY Left 02/15/2017  . PORT-A-CATH REMOVAL N/A 04/16/2014   Procedure: REMOVAL PORT-A-CATH;  Surgeon: Stark Klein, MD;  Location: [facility];  Service: General;  Laterality: N/A;  . PORTACATH PLACEMENT Right 02/04/2013   Procedure: INSERTION PORT-A-CATH;  Surgeon: Stark Klein, MD;  Location: [facility];  Service: General;  Laterality: Right;  Start Time: 8329.  Marland Kitchen SHOULDER ARTHROSCOPY WITH ROTATOR CUFF REPAIR AND SUBACROMIAL DECOMPRESSION Left 02/24/2014   Procedure: SHOULDER ARTHROSCOPY WITH ROTATOR CUFF REPAIR AND SUBACROMIAL DECOMPRESSION;  Surgeon: Meredith Pel, MD;  Location: [facility];  Service: Orthopedics;  Laterality: Left;  LEFT SHOULDER DIAGNOSTIC OPERATIVE ARTHROSCOPY, SUBACROMIAL DECOMPRESSION, ROTATOR CUFF TEAR REPAIR  . SIMPLE MASTECTOMY WITH AXILLARY SENTINEL NODE BIOPSY Left 02/15/2017   Procedure: LEFT MASTECTOMY;  Surgeon: Stark Klein, MD;  [facility];  Service: General;  Laterality: Left;    Family History  Problem Relation Age of Onset  . Lung cancer Father   . Hypertension Father   .  Thyroid cancer Father        dx in his 61s  . Cancer Father        lung, thyroid, smoker  . Breast cancer Paternal Aunt 28  . Colon cancer Paternal Aunt        dx in her 50x  . Cervical cancer Paternal Aunt        dzx in her 49s  . Ovarian cancer Cousin        dx in her lage 75s  . Breast cancer Cousin         maternal first cousin, once removed; dx in her late 45s  . Breast cancer Cousin        maternal first cousin once removed; dx in late 52s  . Hypertension Mother   . Diabetes Mother   . Dementia Mother   . Hypertension Brother   . Seizures Brother        Alcohol induced.  . Alcohol abuse Brother        drinker, smoker  . Cancer Paternal Uncle        oral cancer  . Kidney cancer Paternal Grandmother   . Arthritis Daughter        back surgery  . Cancer Cousin        several paternal cousins with brain cancer, leukemia, and other cancers  . Cancer Sister        stomach    Social History   Socioeconomic History  . Marital status: Married    Spouse name: [name]  . Number of children: 1  . Years of education: 6  . Highest education level: Not on file  Occupational History  . Occupation: INVENTORY Hotel manager: [employer]  Social Needs  . Financial resource strain: Not on file  . Food insecurity:    Worry: Not on file    Inability: Not on file  . Transportation needs:    Medical: Not on file    Non-medical: Not on file  Tobacco Use  . Smoking status: Never Smoker  . Smokeless tobacco: Never Used  Substance and Sexual Activity  . Alcohol use: No  . Drug use: No  . Sexual activity: Yes    Comment: lives with husband, disability/retirement. [employer], no dietary restrictions  Lifestyle  . Physical activity:    Days per week: Not on file    Minutes per session: Not on file  . Stress: Not on file  Relationships  . Social connections:    Talks on phone: Not on file    Gets together: Not on file    Attends religious service: Not on file    Active member of club or organization: Not on file    Attends meetings of clubs or organizations: Not on file    Relationship status: Not on file  . Intimate partner violence:    Fear of current or ex partner: Not on file    Emotionally abused: Not on file    Physically abused: Not on file    Forced  sexual activity: Not on file  Other Topics Concern  . Not on file  Social History Narrative   Patient is married [name]) and lives at home with her husband.   Patient has one daughter.   Patient is working at [employer]   Patient has a 12th grade education.   Patient drinks very little caffeine.    Outpatient Medications Prior to Visit  Medication Sig Dispense  Refill  . amitriptyline (ELAVIL) 100 MG tablet Take 100 mg by mouth at bedtime.  2  . anastrozole (ARIMIDEX) 1 MG tablet TAKE 1 TABLET (1 MG) BY MOUTH DAILY IN THE MORNING 90 tablet 3  . Ascorbic Acid (VITAMIN C) 100 MG tablet Take 100 mg by mouth daily.    Marland Kitchen aspirin EC 81 MG tablet Take 81 mg by mouth daily at 3 pm.     . B Complex-C (SUPER B COMPLEX PO) Take 1 tablet by mouth daily.    . clonazePAM (KLONOPIN) 1 MG tablet TAKE 1 TABLET BY MOUTH EVERY MORNING AND 1/2 TABLET AT BEDTIME 45 tablet 1  . Cyanocobalamin (VITAMIN B-12) 5000 MCG SUBL Place 5,000 mcg under the tongue daily.    Marland Kitchen gabapentin (NEURONTIN) 800 MG tablet Take 1 tablet (800 mg total) by mouth 3 (three) times daily. 90 tablet 2  . HYDROcodone-acetaminophen (NORCO/VICODIN) 5-325 MG tablet Take 1-2 tablets by mouth every 6 (six) hours as needed. For neuropathy/pain. 30 tablet 0  . hydrOXYzine (VISTARIL) 25 MG capsule TAKE ONE CAPSULE BY MOUTH 3 TIMES A DAY AS NEEDED  1  . KLOR-CON M20 20 MEQ tablet TAKE 1 TABLET BY MOUTH EVERY DAY 90 tablet 1  . LATUDA 80 MG TABS tablet TAKE 1 TABLET BY MOUTH EVERY DAY HHS  1  . Lidocaine-Prilocaine, Bulk, 2.5-2.5 % CREA 1 Dose by Does not apply route 2 (two) times daily as needed. 5800 g 2  . linaclotide (LINZESS) 145 MCG CAPS capsule Take 1 capsule (145 mcg total) by mouth daily before breakfast. 30 capsule 0  . Multiple Vitamin (MULTIVITAMIN WITH MINERALS) TABS tablet Take 1 tablet by mouth daily.    . valsartan-hydrochlorothiazide (DIOVAN-HCT) 320-25 MG tablet TAKE 1 TABLET BY MOUTH EVERY DAY IN THE MORNING 90 tablet 1   No  facility-administered medications prior to visit.     Allergies  Allergen Reactions  . No Known Allergies     Review of Systems  Constitutional: Positive for malaise/fatigue. Negative for chills and fever.  HENT: Negative for congestion and hearing loss.   Eyes: Negative for discharge.  Respiratory: Negative for cough, sputum production and shortness of breath.   Cardiovascular: Negative for chest pain, palpitations and leg swelling.  Gastrointestinal: Negative for abdominal pain, blood in stool, constipation, diarrhea, heartburn, nausea and vomiting.  Genitourinary: Negative for dysuria, flank pain, frequency, hematuria and urgency.  Musculoskeletal: Negative for back pain, falls and myalgias.  Skin: Negative for rash.  Neurological: Positive for sensory change. Negative for dizziness, loss of consciousness, weakness and headaches.  Endo/Heme/Allergies: Negative for environmental allergies. Does not bruise/bleed easily.  Psychiatric/Behavioral: Positive for depression. Negative for suicidal ideas. The patient is nervous/anxious. The patient does not have insomnia.        Objective:    Physical Exam  Constitutional: She is oriented to person, place, and time. She appears well-developed and well-nourished. No distress.  HENT:  Head: Normocephalic and atraumatic.  Eyes: Conjunctivae are normal.  Neck: Neck supple. No thyromegaly present.  Cardiovascular: Normal rate, regular rhythm and normal heart sounds.  No murmur heard. Pulmonary/Chest: Effort normal and breath sounds normal. No respiratory distress.  Abdominal: Soft. Bowel sounds are normal. She exhibits no distension and no mass. There is no tenderness.  Genitourinary: Vagina normal and uterus normal. No vaginal discharge found.  Musculoskeletal: She exhibits no edema.  Lymphadenopathy:    She has no cervical adenopathy.  Neurological: She is alert and oriented to person, place, and time.  Skin: Skin is warm and dry.    Psychiatric: She has a normal mood and affect. Her behavior is normal.    BP 106/78 (BP Location: Left Arm, Patient Position: Sitting, Cuff Size: Normal)   Pulse 80   Temp 98.1 F (36.7 C) (Oral)   Resp 18   Ht 5' 2"  (1.575 m)   Wt 185 lb 9.6 oz (84.2 kg)   LMP 01/20/2013   SpO2 95%   BMI 33.95 kg/m  Wt Readings from Last 3 Encounters:  06/18/18 185 lb 9.6 oz (84.2 kg)  05/16/18 184 lb 6.4 oz (83.6 kg)  04/30/18 184 lb 12.8 oz (83.8 kg)   BP Readings from Last 3 Encounters:  06/18/18 106/78  05/16/18 122/76  04/30/18 124/70      There is no immunization history on file for this patient.  Health Maintenance  Topic Date Due  . Hepatitis C Screening  Mar 28, 1956  . HIV Screening  01/27/1970  . TETANUS/TDAP  01/27/1974  . PAP SMEAR  05/16/2018  . INFLUENZA VACCINE  08/29/2018 (Originally 06/06/2018)  . MAMMOGRAM  02/02/2019  . COLONOSCOPY  11/06/2024    Lab Results  Component Value Date   WBC 6.0 06/18/2018   HGB 11.8 (L) 06/18/2018   HCT 36.0 06/18/2018   PLT 174.0 06/18/2018   GLUCOSE 77 06/18/2018   CHOL 183 06/18/2018   TRIG 106.0 06/18/2018   HDL 49.80 06/18/2018   LDLCALC 112 (H) 06/18/2018   ALT 17 06/18/2018   AST 19 06/18/2018   NA 141 06/18/2018   K 4.1 06/18/2018   CL 103 06/18/2018   CREATININE 1.22 (H) 06/18/2018   BUN 12 06/18/2018   CO2 32 06/18/2018   TSH 2.14 06/18/2018   INR 1.02 07/19/2013   HGBA1C 6.0 06/18/2018    Lab Results  Component Value Date   TSH 2.14 06/18/2018   Lab Results  Component Value Date   WBC 6.0 06/18/2018   HGB 11.8 (L) 06/18/2018   HCT 36.0 06/18/2018   MCV 87.7 06/18/2018   PLT 174.0 06/18/2018   Lab Results  Component Value Date   NA 141 06/18/2018   K 4.1 06/18/2018   CHLORIDE 104 01/22/2017   CO2 32 06/18/2018   GLUCOSE 77 06/18/2018   BUN 12 06/18/2018   CREATININE 1.22 (H) 06/18/2018   BILITOT 0.3 06/18/2018   ALKPHOS 82 06/18/2018   AST 19 06/18/2018   ALT 17 06/18/2018   PROT 7.3  06/18/2018   ALBUMIN 4.2 06/18/2018   CALCIUM 10.3 06/18/2018   ANIONGAP 7 02/16/2017   EGFR 67 (L) 01/22/2017   GFR 57.18 (L) 06/18/2018   Lab Results  Component Value Date   CHOL 183 06/18/2018   Lab Results  Component Value Date   HDL 49.80 06/18/2018   Lab Results  Component Value Date   LDLCALC 112 (H) 06/18/2018   Lab Results  Component Value Date   TRIG 106.0 06/18/2018   Lab Results  Component Value Date   CHOLHDL 4 06/18/2018   Lab Results  Component Value Date   HGBA1C 6.0 06/18/2018         Assessment & Plan:   Problem List Items Addressed This Visit    Anxiety and depression    Follows with psychiatry and while she still struggles with depression she feels her meds are working adequately well      Hyperglycemia    hgba1c acceptable, minimize simple carbs. Increase exercise as tolerated.       Relevant  Orders   Hemoglobin A1c (Completed)   Preventative health care    Patient encouraged to maintain heart healthy diet, regular exercise, adequate sleep. Consider daily probiotics. Take medications as prescribed. Given and reviewed copy of ACP documents from [place] of State and encouraged to complete and return      HTN (hypertension)    Well controlled, no changes to meds. Encouraged heart healthy diet such as the DASH diet and exercise as tolerated.       Relevant Orders   CBC (Completed)   Comprehensive metabolic panel (Completed)   TSH (Completed)   Anemia    Increase leafy greens, consider increased lean red meat and using cast iron cookware. Continue to monitor, report any concerns      Hyperlipidemia    Encouraged heart healthy diet, increase exercise, avoid trans fats, consider a krill oil cap daily      Relevant Orders   Lipid panel (Completed)   Cervical cancer screening    Pap today, no concerns on exam.       Relevant Orders   Cytology - PAP   Left upper extremity numbness - Primary    Cervical xray shows foaminal  narrowing and degenerative changes with symptoms will proceed with MRI       Relevant Orders   DG Cervical Spine Complete (Completed)      I am having [name] maintain her B Complex-C (SUPER B COMPLEX PO), aspirin EC, amitriptyline, vitamin C, multivitamin with minerals, Vitamin B-12, clonazePAM, LATUDA, hydrOXYzine, linaclotide, anastrozole, gabapentin, valsartan-hydrochlorothiazide, HYDROcodone-acetaminophen, KLOR-CON M20, and Lidocaine-Prilocaine (Bulk).  No orders of the defined types were placed in this encounter.   CMA served as Education administrator during this visit. History, Physical and Plan performed by medical provider. Documentation and orders reviewed and attested to.  [name], MD

## 2018-06-18 NOTE — Assessment & Plan Note (Signed)
hgba1c acceptable, minimize simple carbs. Increase exercise as tolerated.  

## 2018-06-18 NOTE — Assessment & Plan Note (Signed)
Encouraged heart healthy diet, increase exercise, avoid trans fats, consider a krill oil cap daily 

## 2018-06-18 NOTE — Patient Instructions (Addendum)
Need Tetanus if injured  Shingrix is the new shingles shot, 2 shots over 2-6 months. Check with insurance regarding coverage and then call for nursing visit to get shot  Encouraged increased hydration and fiber in diet. Daily probiotics. If bowels not moving can use MOM 2 tbls po in 4 oz of warm prune juice by mouth every 2-3 days. If no results then repeat in 4 hours with  Dulcolax suppository pr, may repeat again in 4 more hours as needed. Seek care if symptoms worsen. Consider daily Miralax and/or Dulcolax if symptoms persist.  Try Miralax and Benefiber together once to twice daily for constipation   Preventive Care 40-64 Years, Female Preventive care refers to lifestyle choices and visits with your health care provider that can promote health and wellness. What does preventive care include?  A yearly physical exam. This is also called an annual well check.  Dental exams once or twice a year.  Routine eye exams. Ask your health care provider how often you should have your eyes checked.  Personal lifestyle choices, including: ? Daily care of your teeth and gums. ? Regular physical activity. ? Eating a healthy diet. ? Avoiding tobacco and drug use. ? Limiting alcohol use. ? Practicing safe sex. ? Taking low-dose aspirin daily starting at age 62. ? Taking vitamin and mineral supplements as recommended by your health care provider. What happens during an annual well check? The services and screenings done by your health care provider during your annual well check will depend on your age, overall health, lifestyle risk factors, and family history of disease. Counseling Your health care provider may ask you questions about your:  Alcohol use.  Tobacco use.  Drug use.  Emotional well-being.  Home and relationship well-being.  Sexual activity.  Eating habits.  Work and work Statistician.  Method of birth control.  Menstrual cycle.  Pregnancy history.  Screening You  may have the following tests or measurements:  Height, weight, and BMI.  Blood pressure.  Lipid and cholesterol levels. These may be checked every 5 years, or more frequently if you are over 72 years old.  Skin check.  Lung cancer screening. You may have this screening every year starting at age 7 if you have a 30-pack-year history of smoking and currently smoke or have quit within the past 15 years.  Fecal occult blood test (FOBT) of the stool. You may have this test every year starting at age 53.  Flexible sigmoidoscopy or colonoscopy. You may have a sigmoidoscopy every 5 years or a colonoscopy every 10 years starting at age 70.  Hepatitis C blood test.  Hepatitis B blood test.  Sexually transmitted disease (STD) testing.  Diabetes screening. This is done by checking your blood sugar (glucose) after you have not eaten for a while (fasting). You may have this done every 1-3 years.  Mammogram. This may be done every 1-2 years. Talk to your health care provider about when you should start having regular mammograms. This may depend on whether you have a family history of breast cancer.  BRCA-related cancer screening. This may be done if you have a family history of breast, ovarian, tubal, or peritoneal cancers.  Pelvic exam and Pap test. This may be done every 3 years starting at age 110. Starting at age 54, this may be done every 5 years if you have a Pap test in combination with an HPV test.  Bone density scan. This is done to screen for osteoporosis. You may have  this scan if you are at high risk for osteoporosis.  Discuss your test results, treatment options, and if necessary, the need for more tests with your health care provider. Vaccines Your health care provider may recommend certain vaccines, such as:  Influenza vaccine. This is recommended every year.  Tetanus, diphtheria, and acellular pertussis (Tdap, Td) vaccine. You may need a Td booster every 10 years.  Varicella  vaccine. You may need this if you have not been vaccinated.  Zoster vaccine. You may need this after age 11.  Measles, mumps, and rubella (MMR) vaccine. You may need at least one dose of MMR if you were born in 1957 or later. You may also need a second dose.  Pneumococcal 13-valent conjugate (PCV13) vaccine. You may need this if you have certain conditions and were not previously vaccinated.  Pneumococcal polysaccharide (PPSV23) vaccine. You may need one or two doses if you smoke cigarettes or if you have certain conditions.  Meningococcal vaccine. You may need this if you have certain conditions.  Hepatitis A vaccine. You may need this if you have certain conditions or if you travel or work in places where you may be exposed to hepatitis A.  Hepatitis B vaccine. You may need this if you have certain conditions or if you travel or work in places where you may be exposed to hepatitis B.  Haemophilus influenzae type b (Hib) vaccine. You may need this if you have certain conditions.  Talk to your health care provider about which screenings and vaccines you need and how often you need them. This information is not intended to replace advice given to you by your health care provider. Make sure you discuss any questions you have with your health care provider. Document Released: 11/19/2015 Document Revised: 07/12/2016 Document Reviewed: 08/24/2015 Elsevier Interactive Patient Education  [name].

## 2018-06-18 NOTE — Assessment & Plan Note (Signed)
Pap today, no concerns on exam.  

## 2018-06-18 NOTE — Assessment & Plan Note (Signed)
Increase leafy greens, consider increased lean red meat and using cast iron cookware. Continue to monitor, report any concerns 

## 2018-06-18 NOTE — Assessment & Plan Note (Signed)
Well controlled, no changes to meds. Encouraged heart healthy diet such as the DASH diet and exercise as tolerated.  °

## 2018-06-19 DIAGNOSIS — R200 Anesthesia of skin: Secondary | ICD-10-CM | POA: Insufficient documentation

## 2018-06-19 HISTORY — DX: Anesthesia of skin: R20.0

## 2018-06-19 LAB — HEMOGLOBIN A1C: Hgb A1c MFr Bld: 6 % (ref 4.6–6.5)

## 2018-06-19 LAB — COMPREHENSIVE METABOLIC PANEL
ALT: 17 U/L (ref 0–35)
AST: 19 U/L (ref 0–37)
Albumin: 4.2 g/dL (ref 3.5–5.2)
Alkaline Phosphatase: 82 U/L (ref 39–117)
BUN: 12 mg/dL (ref 6–23)
CHLORIDE: 103 meq/L (ref 96–112)
CO2: 32 mEq/L (ref 19–32)
CREATININE: 1.22 mg/dL — AB (ref 0.40–1.20)
Calcium: 10.3 mg/dL (ref 8.4–10.5)
GFR: 57.18 mL/min — ABNORMAL LOW (ref >60.00)
GLUCOSE: 77 mg/dL (ref 70–99)
Potassium: 4.1 mEq/L (ref 3.5–5.1)
SODIUM: 141 meq/L (ref 135–145)
TOTAL PROTEIN: 7.3 g/dL (ref 6.0–8.3)
Total Bilirubin: 0.3 mg/dL (ref 0.2–1.2)

## 2018-06-19 LAB — CBC
HEMATOCRIT: 36 % (ref 36.0–46.0)
Hemoglobin: 11.8 g/dL — ABNORMAL LOW (ref 12.0–15.0)
MCHC: 32.6 g/dL (ref 30.0–36.0)
MCV: 87.7 fl (ref 78.0–100.0)
Platelets: 174 10*3/uL (ref 150.0–400.0)
RBC: 4.11 Mil/uL (ref 3.87–5.11)
RDW: 14.7 % (ref 11.5–15.5)
WBC: 6 10*3/uL (ref 4.0–10.5)

## 2018-06-19 LAB — CYTOLOGY - PAP: DIAGNOSIS: NEGATIVE

## 2018-06-19 LAB — LIPID PANEL
Cholesterol: 183 mg/dL (ref 0–200)
HDL: 49.8 mg/dL (ref >39.00)
LDL CALC: 112 mg/dL — AB (ref 0–99)
NONHDL: 132.77
Total CHOL/HDL Ratio: 4
Triglycerides: 106 mg/dL (ref 0.0–149.0)
VLDL: 21.2 mg/dL (ref 0.0–40.0)

## 2018-06-19 LAB — TSH: TSH: 2.14 u[IU]/mL (ref 0.35–4.50)

## 2018-06-19 NOTE — Assessment & Plan Note (Signed)
Cervical xray shows foaminal narrowing and degenerative changes with symptoms will proceed with MRI

## 2018-06-19 NOTE — Assessment & Plan Note (Signed)
Patient encouraged to maintain heart healthy diet, regular exercise, adequate sleep. Consider daily probiotics. Take medications as prescribed. Given and reviewed copy of ACP documents from [place] Secretary of State and encouraged to complete and return 

## 2018-06-19 NOTE — Assessment & Plan Note (Signed)
Follows with psychiatry and while she still struggles with depression she feels her meds are working adequately well

## 2018-06-25 ENCOUNTER — Other Ambulatory Visit: Payer: Self-pay | Admitting: Family Medicine

## 2018-06-28 ENCOUNTER — Telehealth: Payer: Self-pay | Admitting: *Deleted

## 2018-06-28 NOTE — Telephone Encounter (Signed)
Received Medical records from [facility]; forwarded to provider/SLS 08/23

## 2018-07-01 ENCOUNTER — Other Ambulatory Visit: Payer: Self-pay | Admitting: Family Medicine

## 2018-07-03 NOTE — Telephone Encounter (Signed)
Received refill request for lidocaine-prilocaine (EMLA) cream.   Last OV: 06/18/18

## 2018-08-07 ENCOUNTER — Other Ambulatory Visit: Payer: Self-pay | Admitting: Hematology and Oncology

## 2018-08-07 ENCOUNTER — Encounter: Payer: Self-pay | Admitting: Family Medicine

## 2018-08-07 DIAGNOSIS — C50212 Malignant neoplasm of upper-inner quadrant of left female breast: Secondary | ICD-10-CM

## 2018-08-10 ENCOUNTER — Other Ambulatory Visit: Payer: Self-pay | Admitting: Family Medicine

## 2018-08-12 ENCOUNTER — Other Ambulatory Visit: Payer: Self-pay | Admitting: Family Medicine

## 2018-08-12 DIAGNOSIS — G6289 Other specified polyneuropathies: Secondary | ICD-10-CM

## 2018-08-12 MED ORDER — HYDROCODONE-ACETAMINOPHEN 5-325 MG PO TABS: 1.0000 | ORAL_TABLET | Freq: Four times a day (QID) | ORAL | 0 refills | Status: DC | PRN

## 2018-08-12 NOTE — Telephone Encounter (Signed)
Pt is requesting refill on hydrocodone.   Last OV: 06/18/2018 Last Fill: 02/12/2018 #30 and 0RF UDS: 02/12/2018

## 2018-08-12 NOTE — Telephone Encounter (Signed)
Copied from [place] [id number]. Topic: Quick Communication - Rx Refill/Question >> Aug 12, 2018 12:41 PM Bea Graff, NT wrote: Medication: HYDROcodone-acetaminophen (NORCO/VICODIN) 5-325 MG tablet   Has the patient contacted their pharmacy? Yes.   (Agent: If no, request that the patient contact the pharmacy for the refill.) (Agent: If yes, when and what did the pharmacy advise?)  Preferred Pharmacy (with phone number or street name): [id number] [place], [place] [address] [phone] (Phone) [phone] (Fax)    Agent: Please be advised that RX refills may take up to 3 business days. We ask that you follow-up with your pharmacy.

## 2018-09-30 ENCOUNTER — Other Ambulatory Visit: Payer: Self-pay

## 2018-09-30 NOTE — Patient Outreach (Signed)
[facility]) Care Management  09/30/2018  DEBBERA WOLKEN 1956/04/08 [id number]   Medication Adherence call to Mrs. Alanya Vukelich spoke with patient and [facility] Valsartan/HCTC 320/25 mg is on back order but, patient has medication until the end of December. patient will continue checking with [facility]. Mrs. Null is showing past due under [facility].   [facility] Direct Dial [phone]  Fax [phone] [email or web address]

## 2018-10-14 ENCOUNTER — Encounter: Payer: Self-pay | Admitting: Hematology and Oncology

## 2018-10-14 DIAGNOSIS — Z803 Family history of malignant neoplasm of breast: Secondary | ICD-10-CM | POA: Diagnosis not present

## 2018-10-14 DIAGNOSIS — C50212 Malignant neoplasm of upper-inner quadrant of left female breast: Secondary | ICD-10-CM | POA: Diagnosis not present

## 2018-10-15 ENCOUNTER — Encounter: Payer: Self-pay | Admitting: Hematology and Oncology

## 2018-11-04 ENCOUNTER — Other Ambulatory Visit: Payer: Self-pay | Admitting: Hematology and Oncology

## 2018-11-04 DIAGNOSIS — C50212 Malignant neoplasm of upper-inner quadrant of left female breast: Secondary | ICD-10-CM

## 2018-11-08 ENCOUNTER — Encounter: Payer: Self-pay | Admitting: Hematology and Oncology

## 2018-11-14 ENCOUNTER — Other Ambulatory Visit: Payer: Self-pay

## 2018-11-14 DIAGNOSIS — C50212 Malignant neoplasm of upper-inner quadrant of left female breast: Secondary | ICD-10-CM

## 2018-11-14 MED ORDER — ANASTROZOLE 1 MG PO TABS: ORAL_TABLET | ORAL | 3 refills | Status: DC

## 2018-11-18 ENCOUNTER — Other Ambulatory Visit: Payer: Self-pay | Admitting: Family Medicine

## 2018-11-26 ENCOUNTER — Other Ambulatory Visit: Payer: Self-pay | Admitting: General Surgery

## 2018-11-26 DIAGNOSIS — Z803 Family history of malignant neoplasm of breast: Secondary | ICD-10-CM | POA: Diagnosis not present

## 2018-11-26 DIAGNOSIS — C50212 Malignant neoplasm of upper-inner quadrant of left female breast: Secondary | ICD-10-CM | POA: Diagnosis not present

## 2018-12-02 ENCOUNTER — Ambulatory Visit (INDEPENDENT_AMBULATORY_CARE_PROVIDER_SITE_OTHER): Payer: Medicare Other | Admitting: Medical

## 2018-12-02 ENCOUNTER — Encounter: Payer: Self-pay | Admitting: Medical

## 2018-12-02 ENCOUNTER — Ambulatory Visit: Payer: Self-pay

## 2018-12-02 ENCOUNTER — Telehealth: Payer: Self-pay | Admitting: Family Medicine

## 2018-12-02 VITALS — BP 130/84 | HR 100 | Temp 98.1°F | Resp 16 | Ht 62.0 in | Wt 188.2 lb

## 2018-12-02 DIAGNOSIS — M940 Chondrocostal junction syndrome [Tietze]: Secondary | ICD-10-CM | POA: Diagnosis not present

## 2018-12-02 DIAGNOSIS — M79671 Pain in right foot: Secondary | ICD-10-CM

## 2018-12-02 DIAGNOSIS — R0789 Other chest pain: Secondary | ICD-10-CM

## 2018-12-02 DIAGNOSIS — M79672 Pain in left foot: Secondary | ICD-10-CM

## 2018-12-02 LAB — TROPONIN I: TNIDX: 0 ug/l (ref 0.00–0.06)

## 2018-12-02 MED ORDER — HYDROCODONE-ACETAMINOPHEN 5-325 MG PO TABS: 1.0000 | ORAL_TABLET | Freq: Four times a day (QID) | ORAL | 0 refills | Status: DC | PRN

## 2018-12-02 MED ORDER — KETOROLAC TROMETHAMINE 30 MG/ML IJ SOLN
30.0000 mg | Freq: Once | INTRAMUSCULAR | Status: AC
  Administered 2018-12-02: 30 mg via INTRAMUSCULAR

## 2018-12-02 MED ORDER — PREDNISONE 10 MG PO TABS: ORAL_TABLET | ORAL | 0 refills | Status: DC

## 2018-12-02 NOTE — Telephone Encounter (Signed)
Pt left message stating needing to schedule an appt, called pt and no answer. Ok to schedule pt appt.

## 2018-12-02 NOTE — Progress Notes (Signed)
Subjective:    Patient ID: Kristi [name], female    DOB: 01-Oct-1956, 63 y.o.   MRN: [id number]  HPI  Pt in for some recent chest pain. Started on Friday. She states was doing some volunteer work at [employer]. Pt states was lifting a lot of clothes. Fairly high  stacks of clothes. She states pain would start around hip areas and radiaet upward towards to lower chest. Pain would alternate on different sides. Pain was worse on Saturday. No pain on twisting thorax. When pain was present in chest pain. Pain would last 5-10 minutes and then go away. She states pain was at rest. Pt states Saturday night pain was 8/10 all night long.   She states pain was low level and would come and go. Pain mostly at rest. Did not note with activity. No shortness of breath with pain. No jaw pain, shoulder or arm pain. No nausea or vomiting with above pain.  Pt has htn. High ldl cholesterol, no diabetes, and nonsmoker. But hx of tia.  No abdomen pain. And no belching.    Review of Systems  Constitutional: Negative for chills, fatigue and fever.  Respiratory: Negative for chest tightness, shortness of breath and wheezing.   Cardiovascular: Negative for chest pain and palpitations.  Gastrointestinal: Negative for abdominal pain.  Hematological: Negative for adenopathy. Does not bruise/bleed easily.   Past Medical History:  Diagnosis Date  . Anemia    Iron deficinecy anemia  . Anemia 05/14/2017  . Anxiety   . Anxiety and depression 05/15/2014  . Arthritis   . Breast cancer ([place])    left ,last radiation 2'15, last chemo 8'14  . Constipation 11/13/2016  . Depression   . History of chicken pox   . History of radiation therapy 09/09/13-10/28/13   45 gray to left breast, lumpectomy cavity boosted to 63 gray  . HTN (hypertension) 11/13/2016  . Hyperglycemia 01/09/2016  . Hyperlipidemia 05/14/2017  . Hypertension   . MRSA (methicillin resistant Staphylococcus aureus) 2009   right groin area-no issues now. 04-07-14 PCR  screen negative today.  . Neuropathy   . Preventative health care 11/13/2016     Social History   Socioeconomic History  . Marital status: Married    Spouse name: [name]  . Number of children: 1  . Years of education: 92  . Highest education level: Not on file  Occupational History  . Occupation: INVENTORY Hotel manager: [employer]  Social Needs  . Financial resource strain: Not on file  . Food insecurity:    Worry: Not on file    Inability: Not on file  . Transportation needs:    Medical: Not on file    Non-medical: Not on file  Tobacco Use  . Smoking status: Never Smoker  . Smokeless tobacco: Never Used  Substance and Sexual Activity  . Alcohol use: No  . Drug use: No  . Sexual activity: Yes    Comment: lives with husband, disability/retirement. [employer], no dietary restrictions  Lifestyle  . Physical activity:    Days per week: Not on file    Minutes per session: Not on file  . Stress: Not on file  Relationships  . Social connections:    Talks on phone: Not on file    Gets together: Not on file    Attends religious service: Not on file    Active member of club or organization: Not on file    Attends meetings  of clubs or organizations: Not on file    Relationship status: Not on file  . Intimate partner violence:    Fear of current or ex partner: Not on file    Emotionally abused: Not on file    Physically abused: Not on file    Forced sexual activity: Not on file  Other Topics Concern  . Not on file  Social History Narrative   Patient is married [name]) and lives at home with her husband.   Patient has one daughter.   Patient is working at [employer]   Patient has a 12th grade education.   Patient drinks very little caffeine.    Past Surgical History:  Procedure Laterality Date  . ANAL SPHINCTEROTOMY  04/2011  . APPENDECTOMY  1980  . AXILLARY LYMPH NODE DISSECTION Left 02/04/2013   Procedure: LEFT AXILLARY LYMPH NODE DISSECTION;   Surgeon: Stark Klein, MD;  Location: [facility];  Service: General;  Laterality: Left;  End: 0258  . BREAST LUMPECTOMY WITH NEEDLE LOCALIZATION Left 02/04/2013   Procedure: LEFT BREAST LUMPECTOMY WITH NEEDLE LOCALIZATION;  Surgeon: Stark Klein, MD;  Location: [facility];  Service: General;  Laterality: Left;  . BREAST SURGERY     Lumpectomy in april 2014  . HEMORRHOID SURGERY  04/2011   ligation  . MASTECTOMY Left 02/15/2017  . PORT-A-CATH REMOVAL N/A 04/16/2014   Procedure: REMOVAL PORT-A-CATH;  Surgeon: Stark Klein, MD;  Location: [facility];  Service: General;  Laterality: N/A;  . PORTACATH PLACEMENT Right 02/04/2013   Procedure: INSERTION PORT-A-CATH;  Surgeon: Stark Klein, MD;  Location: [facility];  Service: General;  Laterality: Right;  Start Time: 5277.  Marland Kitchen SHOULDER ARTHROSCOPY WITH ROTATOR CUFF REPAIR AND SUBACROMIAL DECOMPRESSION Left 02/24/2014   Procedure: SHOULDER ARTHROSCOPY WITH ROTATOR CUFF REPAIR AND SUBACROMIAL DECOMPRESSION;  Surgeon: Meredith Pel, MD;  Location: [facility];  Service: Orthopedics;  Laterality: Left;  LEFT SHOULDER DIAGNOSTIC OPERATIVE ARTHROSCOPY, SUBACROMIAL DECOMPRESSION, ROTATOR CUFF TEAR REPAIR  . SIMPLE MASTECTOMY WITH AXILLARY SENTINEL NODE BIOPSY Left 02/15/2017   Procedure: LEFT MASTECTOMY;  Surgeon: Stark Klein, MD;  Location: [facility];  Service: General;  Laterality: Left;    Family History  Problem Relation Age of Onset  . Lung cancer Father   . Hypertension Father   . Thyroid cancer Father        dx in his 72s  . Cancer Father        lung, thyroid, smoker  . Breast cancer Paternal Aunt 43  . Colon cancer Paternal Aunt        dx in her 50x  . Cervical cancer Paternal Aunt        dzx in her 94s  . Ovarian cancer Cousin        dx in her lage 37s  . Breast cancer Cousin        maternal first cousin, once removed; dx in her late 53s  . Breast cancer Cousin        maternal first cousin once removed; dx in late 30s  . Hypertension Mother   . Diabetes Mother   .  Dementia Mother   . Hypertension Brother   . Seizures Brother        Alcohol induced.  . Alcohol abuse Brother        drinker, smoker  . Cancer Paternal Uncle        oral cancer  . Kidney cancer Paternal Grandmother   . Arthritis Daughter        back  surgery  . Cancer Cousin        several paternal cousins with brain cancer, leukemia, and other cancers  . Cancer Sister        stomach    Allergies  Allergen Reactions  . No Known Allergies     Current Outpatient Medications on File Prior to Visit  Medication Sig Dispense Refill  . amitriptyline (ELAVIL) 100 MG tablet Take 100 mg by mouth at bedtime.  2  . anastrozole (ARIMIDEX) 1 MG tablet TAKE 1 TABLET (1 MG) BY MOUTH DAILY IN THE MORNING 90 tablet 3  . Ascorbic Acid (VITAMIN C) 100 MG tablet Take 100 mg by mouth daily.    Marland Kitchen aspirin EC 81 MG tablet Take 81 mg by mouth daily at 3 pm.     . B Complex-C (SUPER B COMPLEX PO) Take 1 tablet by mouth daily.    . clonazePAM (KLONOPIN) 1 MG tablet TAKE 1 TABLET BY MOUTH EVERY MORNING AND 1/2 TABLET AT BEDTIME 45 tablet 1  . Cyanocobalamin (VITAMIN B-12) 5000 MCG SUBL Place 5,000 mcg under the tongue daily.    Marland Kitchen gabapentin (NEURONTIN) 800 MG tablet Take 1 tablet (800 mg total) by mouth 3 (three) times daily. 90 tablet 2  . gabapentin (NEURONTIN) 800 MG tablet TAKE 1 TABLET BY MOUTH THREE TIMES A DAY 270 tablet 0  . HYDROcodone-acetaminophen (NORCO/VICODIN) 5-325 MG tablet Take 1-2 tablets by mouth every 6 (six) hours as needed. For neuropathy/pain. 30 tablet 0  . hydrOXYzine (VISTARIL) 25 MG capsule TAKE ONE CAPSULE BY MOUTH 3 TIMES A DAY AS NEEDED  1  . KLOR-CON M20 20 MEQ tablet TAKE 1 TABLET BY MOUTH EVERY DAY 90 tablet 1  . LATUDA 80 MG TABS tablet TAKE 1 TABLET BY MOUTH EVERY DAY [facility]  1  . lidocaine-prilocaine (EMLA) cream APPLY TO AFFECTED AREA TWICE DAILY AS DIRECTED 30 g 0  . Lidocaine-Prilocaine, Bulk, 2.5-2.5 % CREA 1 Dose by Does not apply route 2 (two) times daily as needed.  5800 g 2  . linaclotide (LINZESS) 145 MCG CAPS capsule Take 1 capsule (145 mcg total) by mouth daily before breakfast. 30 capsule 0  . Multiple Vitamin (MULTIVITAMIN WITH MINERALS) TABS tablet Take 1 tablet by mouth daily.    . valsartan-hydrochlorothiazide (DIOVAN-HCT) 320-25 MG tablet TAKE 1 TABLET BY MOUTH EVERY DAY IN THE MORNING 90 tablet 1  . [DISCONTINUED] prochlorperazine (COMPAZINE) 10 MG tablet Take 1 tablet (10 mg total) by mouth every 6 (six) hours as needed (Nausea or vomiting). 30 tablet 1  . [DISCONTINUED] prochlorperazine (COMPAZINE) 25 MG suppository Place 1 suppository (25 mg total) rectally every 12 (twelve) hours as needed for nausea. 12 suppository 3   No current facility-administered medications on file prior to visit.     BP 130/84   Pulse 100   Temp 98.1 F (36.7 C) (Oral)   Resp 16   Ht 5\' 2"  (1.575 m)   Wt 188 lb 3.2 oz (85.4 kg)   LMP 01/20/2013   SpO2 98%   BMI 34.42 kg/m       Objective:   Physical Exam  General Mental Status- Alert. General Appearance- Not in acute distress.   Skin General: Color- Normal Color. Moisture- Normal Moisture.  Neck Carotid Arteries- Normal color. Moisture- Normal Moisture. No carotid bruits. No JVD.  Chest and Lung Exam Auscultation: Breath Sounds:-Normal.  Cardiovascular Auscultation:Rythm- Regular. Murmurs & Other Heart Sounds:Auscultation of the heart reveals- No Murmurs.  Abdomen Inspection:-Inspeection Normal. Palpation/Percussion:Note:No mass. Palpation  and Percussion of the abdomen reveal- Non Tender, Non Distended + BS, no rebound or guarding.    Neurologic Cranial Nerve exam:- CN III-XII intact(No nystagmus), symmetric smile. Strength:- 5/5 equal and symmetric strength both upper and lower extremities.  Hips- no pain on palpation. Anterior thorax- left costochondral junction tender to palpation.(pt does not area of reproducable pain is where she had the pain.)      Assessment & Plan:  You  do have some recent atypical chest wall pain in terms of how the pain started.  The pain has subsided significantly and is now only present on palpation of chest wall/costochondral junction.  Due to your moderate risk factors, I do think it is best that we do a troponin protein test today.  Your EKG appeared to show no ischemic changes/normal sinus rhythm.  We will give you Toradol 30 mg IM injection today in the office.  Then tomorrow you can start 4 day short taper dose of prednisone.  Update me in 2 days whether or not you have any chest wall/costochondral junction tenderness on palpation.   If your pain persist despite the above measures then I will most likely refer you to cardiologist to be cautious in light of your risk factors.  If you have any pain that is worse or other cardiac type associated features then recommend ED evaluation.  Follow-up in 7 to 10 days or as needed.

## 2018-12-02 NOTE — Telephone Encounter (Signed)
Pt. Reports 4 days ago was lifting clothes and started having "upper chest pain." Above both breasts.Was constant. Took a hydrocodone and applied heat and it helped. No pain this morning. No availability with Dr. Charlett Blake. Appointment made.  Reason for Disposition . [1] Chest pain lasts > 5 minutes AND [2] occurred > 3 days ago (72 hours) AND [3] NO chest pain or cardiac symptoms now  Answer Assessment - Initial Assessment Questions 1. LOCATION: "Where does it hurt?"       Pain both sides at her breast 2. RADIATION: "Does the pain go anywhere else?" (e.g., into neck, jaw, arms, back)     No 3. ONSET: "When did the chest pain begin?" (Minutes, hours or days)      Started 4 days 4. PATTERN "Does the pain come and go, or has it been constant since it started?"  "Does it get worse with exertion?"      Has gotten better - no pain now 5. DURATION: "How long does it last" (e.g., seconds, minutes, hours)     Not constant now 6. SEVERITY: "How bad is the pain?"  (e.g., Scale 1-10; mild, moderate, or severe)    - MILD (1-3): doesn't interfere with normal activities     - MODERATE (4-7): interferes with normal activities or awakens from sleep    - SEVERE (8-10): excruciating pain, unable to do any normal activities       No pain now 7. CARDIAC RISK FACTORS: "Do you have any history of heart problems or risk factors for heart disease?" (e.g., prior heart attack, angina; high blood pressure, diabetes, being overweight, high cholesterol, smoking, or strong family history of heart disease)     HTN 8. PULMONARY RISK FACTORS: "Do you have any history of lung disease?"  (e.g., blood clots in lung, asthma, emphysema, birth control pills)     No 9. CAUSE: "What do you think is causing the chest pain?"     Pulled muscle 10. OTHER SYMPTOMS: "Do you have any other symptoms?" (e.g., dizziness, nausea, vomiting, sweating, fever, difficulty breathing, cough)       No 11. PREGNANCY: "Is there any chance you are  pregnant?" "When was your last menstrual period?"       No  Protocols used: CHEST PAIN-A-AH

## 2018-12-02 NOTE — Patient Instructions (Addendum)
You do have some recent atypical chest wall pain in terms of how the pain started.  The pain has subsided significantly and is now only present on palpation of chest wall/costochondral junction.  Due to your moderate risk factors, I do think it is best that we do a troponin protein test today.  Your EKG appeared to show no ischemic changes/normal sinus rhythm.  We will give you Toradol 30 mg IM injection today in the office.  Then tomorrow you can start 4day short taper dose of prednisone.  Update me in 2 days whether or not you have any chest wall/costochondral junction tenderness on palpation.   If your pain persist despite the above measures then I will most likely refer you to cardiologist to be cautious in light of your risk factors.  If you have any pain that is worse or other cardiac type associated features then recommend ED evaluation.  Follow-up in 7 to 10 days or as needed.  I did refill patient's Norco for her chronic feet pain.  Appears that she has had drug screens in the past and controlled medication contract.  Only gave 30 tablets supply.  Will defer further refills of Norco to her PCP.

## 2018-12-05 ENCOUNTER — Encounter: Payer: Self-pay | Admitting: Family Medicine

## 2018-12-05 DIAGNOSIS — Z01818 Encounter for other preprocedural examination: Secondary | ICD-10-CM | POA: Diagnosis not present

## 2018-12-05 DIAGNOSIS — Z853 Personal history of malignant neoplasm of breast: Secondary | ICD-10-CM | POA: Diagnosis not present

## 2018-12-05 LAB — HM MAMMOGRAPHY

## 2018-12-09 ENCOUNTER — Telehealth: Payer: Self-pay | Admitting: *Deleted

## 2018-12-09 NOTE — Telephone Encounter (Signed)
Received Mammogram results from [facility]; forwarded to provider/SLS 02/03   

## 2018-12-18 NOTE — Pre-Procedure Instructions (Signed)
Kristi [name]  12/18/2018      [id number] [address], [name] [address] Phone: [phone] Fax: [phone]  [place], [facility] [address] [place] [address] [place] [address] Phone: [phone] Fax: [id number] - [address], [address] AT [facility] [address] [name] [address] Phone: [phone] Fax: [phone]  [facility] [place], [place] AT [address] [name] [address] Phone: [phone] Fax: [phone]    Your procedure is scheduled on Thursday February 20th.  Report to [facility] Admitting at 7:00 A.M.  Call this number if you have problems the morning of surgery:  [phone]   Remember:  Do not eat after midnight. You may drink clear liquids until 6:00 AM. Clear liquids include water, non-citrus juices without pulp, carbonated  beverages, clear tea, black coffee and gatorade.    Take these medicines the morning of surgery with A SIP OF WATER  anastrozole (ARIMIDEX)  clonazePAM (KLONOPIN)  gabapentin (NEURONTIN) HYDROcodone-acetaminophen (NORCO) -if needed hydrOXYzine (VISTARIL)-if needed  Follow your surgeon's instructions on when to stop Asprin.  If no instructions were given by your surgeon then you will need to call the office to get those instructions.    7 days prior to surgery STOP taking any Aspirin(unless otherwise instructed by your surgeon), Aleve, Naproxen, Ibuprofen, Motrin, Advil, Goody's, BC's, all herbal medications, fish oil, and all vitamins     Do not wear jewelry, make-up or nail polish.  Do not wear lotions, powders, or perfumes, or deodorant.  Do not shave 48 hours prior to surgery.  .  Do not bring valuables to the hospital.  [facility] is not responsible for any belongings or  valuables.  Contacts, dentures or bridgework may not be worn into surgery.  Leave your suitcase in the car.  After surgery it may be brought to your room.  For patients admitted to the hospital, discharge time will be determined by your treatment team.  Patients discharged the day of surgery will not be allowed to drive home.   [facility] Preparing For Surgery  Before surgery, you can play an important role. Because skin is not sterile, your skin needs to be as free of germs as possible. You can reduce the number of germs on your skin by washing with CHG (chlorahexidine gluconate) Soap before surgery.  CHG is an antiseptic cleaner which kills germs and bonds with the skin to continue killing germs even after washing.    Oral Hygiene is also important to reduce your risk of infection.  Remember - BRUSH YOUR TEETH THE MORNING OF SURGERY WITH YOUR REGULAR TOOTHPASTE  Please do not use if you have an allergy to CHG or antibacterial soaps. If your skin becomes reddened/irritated stop using the CHG.  Do not shave (including legs and underarms) for at least 48 hours prior to first CHG shower. It is OK to shave your face.  Please follow these instructions carefully.   1. Shower the NIGHT BEFORE SURGERY and the MORNING OF SURGERY with CHG.   2. If you chose to wash your hair, wash your hair first as usual with your normal shampoo.  3. After you shampoo, rinse your hair and body thoroughly to remove the shampoo.  4. Use CHG as you  would any other liquid soap. You can apply CHG directly to the skin and wash gently with a scrungie or a clean washcloth.   5. Apply the CHG Soap to your body ONLY FROM THE NECK DOWN.  Do not use on open wounds or open sores. Avoid contact with your eyes, ears, mouth and genitals (private parts). Wash Face and genitals (private parts)  with your normal soap.  6. Wash thoroughly, paying special attention to the area where your surgery will be performed.  7. Thoroughly  rinse your body with warm water from the neck down.  8. DO NOT shower/wash with your normal soap after using and rinsing off the CHG Soap.  9. Pat yourself dry with a CLEAN TOWEL.  10. Wear CLEAN PAJAMAS to bed the night before surgery, wear comfortable clothes the morning of surgery  11. Place CLEAN SHEETS on your bed the night of your first shower and DO NOT SLEEP WITH PETS.    Day of Surgery: Shower as stated above. Do not apply any deodorants/lotions.  Please wear clean clothes to the hospital/surgery center.   Remember to brush your teeth WITH YOUR REGULAR TOOTHPASTE.   Please read over the following fact sheets that you were given.

## 2018-12-19 ENCOUNTER — Encounter (HOSPITAL_COMMUNITY): Payer: Self-pay

## 2018-12-19 ENCOUNTER — Other Ambulatory Visit: Payer: Self-pay

## 2018-12-19 ENCOUNTER — Encounter (HOSPITAL_COMMUNITY)
Admission: RE | Admit: 2018-12-19 | Discharge: 2018-12-19 | Disposition: A | Discharge status: Home / Self Care | Payer: Medicare Other | Source: Ambulatory Visit | Attending: General Surgery | Admitting: General Surgery

## 2018-12-19 ENCOUNTER — Encounter: Payer: Self-pay | Admitting: Family Medicine

## 2018-12-19 ENCOUNTER — Ambulatory Visit: Payer: Self-pay | Admitting: Family Medicine

## 2018-12-19 DIAGNOSIS — Z01812 Encounter for preprocedural laboratory examination: Secondary | ICD-10-CM | POA: Diagnosis not present

## 2018-12-19 LAB — CBC
HEMATOCRIT: 35.6 % — AB (ref 36.0–46.0)
Hemoglobin: 11.7 g/dL — ABNORMAL LOW (ref 12.0–15.0)
MCH: 28.3 pg (ref 26.0–34.0)
MCHC: 32.9 g/dL (ref 30.0–36.0)
MCV: 86 fL (ref 80.0–100.0)
Platelets: 170 10*3/uL (ref 150–400)
RBC: 4.14 MIL/uL (ref 3.87–5.11)
RDW: 14.5 % (ref 11.5–15.5)
WBC: 5.6 10*3/uL (ref 4.0–10.5)
nRBC: 0 % (ref 0.0–0.2)

## 2018-12-19 LAB — BASIC METABOLIC PANEL
Anion gap: 12 (ref 5–15)
BUN: 11 mg/dL (ref 8–23)
CHLORIDE: 103 mmol/L (ref 98–111)
CO2: 27 mmol/L (ref 22–32)
CREATININE: 1.2 mg/dL — AB (ref 0.44–1.00)
Calcium: 10.1 mg/dL (ref 8.9–10.3)
GFR calc Af Amer: 56 mL/min — ABNORMAL LOW (ref >60)
GFR calc non Af Amer: 48 mL/min — ABNORMAL LOW (ref >60)
Glucose, Bld: 112 mg/dL — ABNORMAL HIGH (ref 70–99)
POTASSIUM: 3.7 mmol/L (ref 3.5–5.1)
Sodium: 142 mmol/L (ref 135–145)

## 2018-12-19 NOTE — Progress Notes (Signed)
PCP - Dr. Maryellen Pile  Cardiologist - Denies  Chest x-ray - Denies  EKG - 12/02/18 (E)  Stress Test - Denies  ECHO - 07/20/13 (E)  Cardiac Cath - Denies  AICD- na PM- na LOOP- na  Sleep Study - Denies CPAP - None  LABS- 12/19/2018: CBC, BMP  ASA- LD- 2/14- per Dr. Marlowe Aschoff office, pt made aware   Anesthesia- No  Pt denies having chest pain, sob, or fever at this time. All instructions explained to the pt, with a verbal understanding of the material. Pt agrees to go over the instructions while at home for a better understanding. The opportunity to ask questions was provided.

## 2018-12-23 NOTE — H&P (Signed)
Kristi [name] Documented: 11/26/2018 4:20 PM Location: [facility] Patient #: [id number] DOB: [birth year] Married / Language: English / Race: Black or African American Female   History of Present Illness Kristi Klein MD; 11/28/2018 2:04 PM) The patient is a 63 year old female who presents for a follow-up for Breast cancer. Pt is a 63 yo F who was diagnosed with right breast cancer in march 2014. She underwent surgery for pT1cN1M0 right breast cancer. (R lumpectomy and ALND). This was weakly ER positive, but was PR negative and her2 negative. She had adjuvant chemotherapy and radiation.  She had horrible issues with lymphedema and failed PT in the manner that her insurance company would allow. She underwent left mastectomy 02/15/2017.   Her chest wall pain on the left is better. She still battles with depression. She continues to have minimal issues with anastrozole. At her last visit, she inquired about getting mastectomy on right preventatively due to her significant family history of cancer and for symmetry. Her insurance would approve this. She is here to discuss.   [facility] right dx mammogram/us 01/31/2018 birads 2, benign.    Allergies (April Staton, CMA; 11/26/2018 4:20 PM) No Known Drug Allergies [08/16/2015]:  Medication History (April Staton, CMA; 11/26/2018 4:20 PM) Illusions C Breast Prosthesis (1 (one)) Active. [phone] Post-mastectomy camisole - to hold drain tubes and wear during healing.; QTY: 4; Length of use: 3 Months 3 Refills ; L8000 Post-surgical bras - to hold breast prosthesis ; QTY: 6; Length of use:3 Months 3 Refills ; L8020Non-silicone breast prosthesis - for temporary use during healing or for comfort at end of day, lighter weight, cooler for hot weather.; QTY:2 for single; Length of use: 6 months Up to 1 refill; Replacement : one to wear, one to wash due to dry time; U0454 Silicone Breast Prosthesis - to restore balance and symmetry after breast  surgery; QTY: 2 for single; Length of use: 2 years No refills ; Replacement: one for everyday use and one for athletic/exercise use) Latuda (80MG Tablet, Oral) Active. HydrOXYzine HCl (10MG Tablet, Oral) Active. Hydrocodone-Acetaminophen (5-325MG Tablet, Oral) Active. Anastrozole (1MG Tablet, Oral) Active. Multivitamin (Oral) Active. Vitamin C (1000MG Tablet, Oral) Active. Aspirin (81MG Tablet, Oral) Active. Super B Complex/Vitamin C (Oral) Active. Klor-Con M20 [facility] Tablet ER, Oral) Active. ClonazePAM (1MG Tablet, Oral) Active. Gabapentin (800MG Tablet, Oral) Active. Valsartan-Hydrochlorothiazide (320-25MG Tablet, Oral) Active. Medications Reconciled    Review of Systems Kristi Klein MD; 11/28/2018 2:05 PM) All other systems negative  Vitals (April Staton CMA; 11/26/2018 4:21 PM) 11/26/2018 4:20 PM Weight: 187 lb Height: 62in Body Surface Area: 1.86 m Body Mass Index: 34.2 kg/m  Temp.: 98.24F(Oral)  Pulse: 92 (Regular)        Physical Exam Kristi Klein MD; 11/28/2018 2:05 PM) General Mental Status-Alert. General Appearance-Consistent with stated age. Hydration-Well hydrated. Voice-Normal.  Head and Neck Head-normocephalic, atraumatic with no lesions or palpable masses.  Eye Sclera/Conjunctiva - Bilateral-No scleral icterus.  Chest and Lung Exam Chest and lung exam reveals -quiet, even and easy respiratory effort with no use of accessory muscles. Inspection Chest Wall - Normal. Back - normal.  Breast Note: No changes. Right breast without masses. + ptosis. No LAD. no nipple retraction or skin dimpling. Left breast surgically absent. No palpable chest wall masses. Left axillary tightness is improving. no left LAD.   Cardiovascular Cardiovascular examination reveals -normal pedal pulses bilaterally. Note: regular rate and rhythm  Abdomen Inspection-Inspection Normal. Palpation/Percussion Palpation and  Percussion of the abdomen reveal - Soft,  Non Tender, No Rebound tenderness, No Rigidity (guarding) and No hepatosplenomegaly.  Peripheral Vascular Upper Extremity Inspection - Bilateral - Normal - No Clubbing, No Cyanosis, No Edema, Pulses Intact. Lower Extremity Palpation - Edema - Bilateral - No edema.  Neurologic Neurologic evaluation reveals -alert and oriented x 3 with no impairment of recent or remote memory. Mental Status-Normal.  Musculoskeletal Global Assessment -Note: no gross deformities.  Normal Exam - Left-Upper Extremity Strength Normal and Lower Extremity Strength Normal. Normal Exam - Right-Upper Extremity Strength Normal and Lower Extremity Strength Normal.  Lymphatic Head & Neck  General Head & Neck Lymphatics: Bilateral - Description - Normal. Axillary  General Axillary Region: Bilateral - Description - Normal. Tenderness - Non Tender.    Assessment & Plan Kristi Klein MD; 11/28/2018 2:07 PM) CARCINOMA OF UPPER-INNER QUADRANT OF LEFT FEMALE BREAST (C50.212) Impression: NED 5 years out  Anastrozole planned to be stopped int he next year per dr [name].  Will get right mammogram prior to right prophylactic mastectomy.  Reviewed surgery as well as risks. Discussed pain and swelling, dissatisfaction with scar.  Family present. Pt not tearful and in good sound mind. She is confident about pursuing right mastectomy. Her right breast is so large that it is very difficult to get any prosthesis to balance. FAMILY HISTORY OF BREAST CANCER (Z80.3) Impression: Desires right prophylactic mastectomy. This is not unreasonable. Pt desires risk reduction in addition to symmetry.    Signed by Kristi Klein, MD (11/28/2018 2:07 PM)

## 2018-12-26 ENCOUNTER — Observation Stay (HOSPITAL_COMMUNITY)
Admission: RE | Admit: 2018-12-26 | Discharge: 2018-12-28 | Disposition: A | Discharge status: Home / Self Care | Payer: Medicare Other | Attending: General Surgery | Admitting: General Surgery

## 2018-12-26 ENCOUNTER — Ambulatory Visit (HOSPITAL_COMMUNITY): Payer: Medicare Other | Admitting: Anesthesiology

## 2018-12-26 ENCOUNTER — Encounter (HOSPITAL_COMMUNITY)
Admission: RE | Disposition: A | Discharge status: Home / Self Care | Payer: Self-pay | Source: Home / Self Care | Attending: General Surgery

## 2018-12-26 ENCOUNTER — Encounter (HOSPITAL_COMMUNITY): Payer: Self-pay

## 2018-12-26 ENCOUNTER — Other Ambulatory Visit: Payer: Self-pay

## 2018-12-26 DIAGNOSIS — Z7982 Long term (current) use of aspirin: Secondary | ICD-10-CM | POA: Diagnosis not present

## 2018-12-26 DIAGNOSIS — R42 Dizziness and giddiness: Secondary | ICD-10-CM | POA: Insufficient documentation

## 2018-12-26 DIAGNOSIS — E785 Hyperlipidemia, unspecified: Secondary | ICD-10-CM | POA: Diagnosis not present

## 2018-12-26 DIAGNOSIS — F329 Major depressive disorder, single episode, unspecified: Secondary | ICD-10-CM | POA: Insufficient documentation

## 2018-12-26 DIAGNOSIS — Z79811 Long term (current) use of aromatase inhibitors: Secondary | ICD-10-CM | POA: Diagnosis not present

## 2018-12-26 DIAGNOSIS — Z4001 Encounter for prophylactic removal of breast: Secondary | ICD-10-CM | POA: Diagnosis not present

## 2018-12-26 DIAGNOSIS — Z9012 Acquired absence of left breast and nipple: Secondary | ICD-10-CM | POA: Insufficient documentation

## 2018-12-26 DIAGNOSIS — Z79899 Other long term (current) drug therapy: Secondary | ICD-10-CM | POA: Insufficient documentation

## 2018-12-26 DIAGNOSIS — I10 Essential (primary) hypertension: Secondary | ICD-10-CM | POA: Diagnosis not present

## 2018-12-26 DIAGNOSIS — G8918 Other acute postprocedural pain: Secondary | ICD-10-CM | POA: Diagnosis not present

## 2018-12-26 DIAGNOSIS — Z9011 Acquired absence of right breast and nipple: Secondary | ICD-10-CM

## 2018-12-26 DIAGNOSIS — N6011 Diffuse cystic mastopathy of right breast: Secondary | ICD-10-CM | POA: Insufficient documentation

## 2018-12-26 DIAGNOSIS — Z803 Family history of malignant neoplasm of breast: Secondary | ICD-10-CM | POA: Diagnosis not present

## 2018-12-26 DIAGNOSIS — Z8673 Personal history of transient ischemic attack (TIA), and cerebral infarction without residual deficits: Secondary | ICD-10-CM | POA: Diagnosis not present

## 2018-12-26 DIAGNOSIS — C50912 Malignant neoplasm of unspecified site of left female breast: Secondary | ICD-10-CM | POA: Insufficient documentation

## 2018-12-26 DIAGNOSIS — Z853 Personal history of malignant neoplasm of breast: Secondary | ICD-10-CM | POA: Diagnosis not present

## 2018-12-26 DIAGNOSIS — F419 Anxiety disorder, unspecified: Secondary | ICD-10-CM | POA: Diagnosis not present

## 2018-12-26 HISTORY — PX: TOTAL MASTECTOMY: SHX6129

## 2018-12-26 HISTORY — DX: Acquired absence of right breast and nipple: Z90.11

## 2018-12-26 SURGERY — MASTECTOMY, SIMPLE
Anesthesia: General | Site: Breast | Laterality: Right

## 2018-12-26 MED ORDER — AMITRIPTYLINE HCL 50 MG PO TABS
100.0000 mg | ORAL_TABLET | Freq: Every day | ORAL | Status: DC
  Administered 2018-12-26 – 2018-12-27 (×2): 100 mg via ORAL
  Filled 2018-12-26 (×2): qty 2

## 2018-12-26 MED ORDER — FENTANYL CITRATE (PF) 100 MCG/2ML IJ SOLN: 25.0000 ug | INTRAMUSCULAR | Status: DC | PRN

## 2018-12-26 MED ORDER — ACETAMINOPHEN 325 MG PO TABS
650.0000 mg | ORAL_TABLET | Freq: Four times a day (QID) | ORAL | Status: DC | PRN
  Administered 2018-12-27 – 2018-12-28 (×2): 650 mg via ORAL
  Filled 2018-12-26 (×2): qty 2

## 2018-12-26 MED ORDER — LIDOCAINE 2% (20 MG/ML) 5 ML SYRINGE
INTRAMUSCULAR | Status: DC | PRN
  Administered 2018-12-26: 50 mg via INTRAVENOUS

## 2018-12-26 MED ORDER — PHENYLEPHRINE 40 MCG/ML (10ML) SYRINGE FOR IV PUSH (FOR BLOOD PRESSURE SUPPORT)
PREFILLED_SYRINGE | INTRAVENOUS | Status: DC | PRN
  Administered 2018-12-26: 120 ug via INTRAVENOUS
  Administered 2018-12-26: 80 ug via INTRAVENOUS
  Administered 2018-12-26: 120 ug via INTRAVENOUS
  Administered 2018-12-26 (×2): 80 ug via INTRAVENOUS

## 2018-12-26 MED ORDER — ANASTROZOLE 1 MG PO TABS
1.0000 mg | ORAL_TABLET | Freq: Every day | ORAL | Status: DC
  Administered 2018-12-27 – 2018-12-28 (×2): 1 mg via ORAL
  Filled 2018-12-26 (×2): qty 1

## 2018-12-26 MED ORDER — DEXAMETHASONE SODIUM PHOSPHATE 10 MG/ML IJ SOLN
INTRAMUSCULAR | Status: DC | PRN
  Administered 2018-12-26: 5 mg via INTRAVENOUS

## 2018-12-26 MED ORDER — CEFAZOLIN SODIUM-DEXTROSE 2-4 GM/100ML-% IV SOLN
2.0000 g | INTRAVENOUS | Status: AC
  Administered 2018-12-26: 2 g via INTRAVENOUS
  Filled 2018-12-26: qty 100

## 2018-12-26 MED ORDER — FENTANYL CITRATE (PF) 100 MCG/2ML IJ SOLN
INTRAMUSCULAR | Status: DC | PRN
  Administered 2018-12-26: 50 ug via INTRAVENOUS
  Administered 2018-12-26: 25 ug via INTRAVENOUS

## 2018-12-26 MED ORDER — BISACODYL 10 MG RE SUPP: 10.0000 mg | Freq: Every day | RECTAL | Status: DC | PRN

## 2018-12-26 MED ORDER — ACETAMINOPHEN 500 MG PO TABS
1000.0000 mg | ORAL_TABLET | ORAL | Status: AC
  Administered 2018-12-26: 1000 mg via ORAL
  Filled 2018-12-26: qty 2

## 2018-12-26 MED ORDER — ONDANSETRON HCL 4 MG/2ML IJ SOLN: 4.0000 mg | Freq: Four times a day (QID) | INTRAMUSCULAR | Status: DC | PRN

## 2018-12-26 MED ORDER — CELECOXIB 200 MG PO CAPS
200.0000 mg | ORAL_CAPSULE | Freq: Two times a day (BID) | ORAL | Status: DC
  Administered 2018-12-26 – 2018-12-28 (×4): 200 mg via ORAL
  Filled 2018-12-26 (×5): qty 1

## 2018-12-26 MED ORDER — SENNOSIDES-DOCUSATE SODIUM 8.6-50 MG PO TABS
1.0000 | ORAL_TABLET | Freq: Every evening | ORAL | Status: DC | PRN
  Administered 2018-12-27: 1 via ORAL
  Filled 2018-12-26: qty 1

## 2018-12-26 MED ORDER — CLONAZEPAM 0.5 MG PO TABS
0.5000 mg | ORAL_TABLET | Freq: Every day | ORAL | Status: DC
  Administered 2018-12-26 – 2018-12-27 (×2): 0.5 mg via ORAL
  Filled 2018-12-26 (×2): qty 1

## 2018-12-26 MED ORDER — GABAPENTIN 400 MG PO CAPS
800.0000 mg | ORAL_CAPSULE | Freq: Three times a day (TID) | ORAL | Status: DC
  Administered 2018-12-26 – 2018-12-28 (×6): 800 mg via ORAL
  Filled 2018-12-26 (×6): qty 2

## 2018-12-26 MED ORDER — TRAMADOL HCL 50 MG PO TABS: 50.0000 mg | ORAL_TABLET | Freq: Four times a day (QID) | ORAL | Status: DC | PRN

## 2018-12-26 MED ORDER — MIDAZOLAM HCL 2 MG/2ML IJ SOLN
INTRAMUSCULAR | Status: AC
  Administered 2018-12-26: 1 mg via INTRAVENOUS
  Filled 2018-12-26: qty 2

## 2018-12-26 MED ORDER — PROPOFOL 10 MG/ML IV BOLUS
INTRAVENOUS | Status: DC | PRN
  Administered 2018-12-26: 160 mg via INTRAVENOUS

## 2018-12-26 MED ORDER — BUPIVACAINE HCL (PF) 0.5 % IJ SOLN
INTRAMUSCULAR | Status: DC | PRN
  Administered 2018-12-26: 25 mL via PERINEURAL

## 2018-12-26 MED ORDER — HYDRALAZINE HCL 20 MG/ML IJ SOLN: 10.0000 mg | INTRAMUSCULAR | Status: DC | PRN

## 2018-12-26 MED ORDER — ONDANSETRON HCL 4 MG/2ML IJ SOLN
INTRAMUSCULAR | Status: AC
  Filled 2018-12-26: qty 2

## 2018-12-26 MED ORDER — MIDAZOLAM HCL 2 MG/2ML IJ SOLN
INTRAMUSCULAR | Status: AC
  Filled 2018-12-26: qty 2

## 2018-12-26 MED ORDER — VALSARTAN-HYDROCHLOROTHIAZIDE 320-25 MG PO TABS: 1.0000 | ORAL_TABLET | Freq: Every morning | ORAL | Status: DC

## 2018-12-26 MED ORDER — OXYCODONE HCL 5 MG/5ML PO SOLN: 5.0000 mg | Freq: Once | ORAL | Status: DC | PRN

## 2018-12-26 MED ORDER — ACETAMINOPHEN 650 MG RE SUPP: 650.0000 mg | Freq: Four times a day (QID) | RECTAL | Status: DC | PRN

## 2018-12-26 MED ORDER — LURASIDONE HCL 40 MG PO TABS
80.0000 mg | ORAL_TABLET | Freq: Every day | ORAL | Status: DC
  Administered 2018-12-26 – 2018-12-27 (×2): 80 mg via ORAL
  Filled 2018-12-26 (×2): qty 2

## 2018-12-26 MED ORDER — OXYCODONE HCL 5 MG PO TABS: 5.0000 mg | ORAL_TABLET | Freq: Once | ORAL | Status: DC | PRN

## 2018-12-26 MED ORDER — SIMETHICONE 80 MG PO CHEW: 40.0000 mg | CHEWABLE_TABLET | Freq: Four times a day (QID) | ORAL | Status: DC | PRN

## 2018-12-26 MED ORDER — HYDROCHLOROTHIAZIDE 25 MG PO TABS
25.0000 mg | ORAL_TABLET | Freq: Every day | ORAL | Status: DC
  Administered 2018-12-27 – 2018-12-28 (×2): 25 mg via ORAL
  Filled 2018-12-26 (×2): qty 1

## 2018-12-26 MED ORDER — ONDANSETRON HCL 4 MG/2ML IJ SOLN
INTRAMUSCULAR | Status: DC | PRN
  Administered 2018-12-26: 4 mg via INTRAVENOUS

## 2018-12-26 MED ORDER — PROPOFOL 10 MG/ML IV BOLUS
INTRAVENOUS | Status: AC
  Filled 2018-12-26: qty 20

## 2018-12-26 MED ORDER — GABAPENTIN 800 MG PO TABS
800.0000 mg | ORAL_TABLET | Freq: Three times a day (TID) | ORAL | Status: DC
  Filled 2018-12-26: qty 1

## 2018-12-26 MED ORDER — DOCUSATE SODIUM 100 MG PO CAPS
100.0000 mg | ORAL_CAPSULE | Freq: Two times a day (BID) | ORAL | Status: DC
  Administered 2018-12-26 – 2018-12-28 (×4): 100 mg via ORAL
  Filled 2018-12-26 (×5): qty 1

## 2018-12-26 MED ORDER — 0.9 % SODIUM CHLORIDE (POUR BTL) OPTIME
TOPICAL | Status: DC | PRN
  Administered 2018-12-26 (×2): 1000 mL

## 2018-12-26 MED ORDER — DIPHENHYDRAMINE HCL 50 MG/ML IJ SOLN: 12.5000 mg | Freq: Four times a day (QID) | INTRAMUSCULAR | Status: DC | PRN

## 2018-12-26 MED ORDER — FENTANYL CITRATE (PF) 250 MCG/5ML IJ SOLN
INTRAMUSCULAR | Status: AC
  Filled 2018-12-26: qty 5

## 2018-12-26 MED ORDER — IRBESARTAN 300 MG PO TABS
300.0000 mg | ORAL_TABLET | Freq: Every day | ORAL | Status: DC
  Administered 2018-12-27 – 2018-12-28 (×2): 300 mg via ORAL
  Filled 2018-12-26 (×2): qty 1

## 2018-12-26 MED ORDER — GABAPENTIN 300 MG PO CAPS
300.0000 mg | ORAL_CAPSULE | ORAL | Status: DC
  Filled 2018-12-26: qty 1

## 2018-12-26 MED ORDER — CEFAZOLIN SODIUM-DEXTROSE 2-4 GM/100ML-% IV SOLN
2.0000 g | Freq: Three times a day (TID) | INTRAVENOUS | Status: AC
  Administered 2018-12-26: 2 g via INTRAVENOUS
  Filled 2018-12-26: qty 100

## 2018-12-26 MED ORDER — DEXAMETHASONE SODIUM PHOSPHATE 10 MG/ML IJ SOLN
INTRAMUSCULAR | Status: AC
  Filled 2018-12-26: qty 1

## 2018-12-26 MED ORDER — ONDANSETRON 4 MG PO TBDP: 4.0000 mg | ORAL_TABLET | Freq: Four times a day (QID) | ORAL | Status: DC | PRN

## 2018-12-26 MED ORDER — DIPHENHYDRAMINE HCL 12.5 MG/5ML PO ELIX: 12.5000 mg | ORAL_SOLUTION | Freq: Four times a day (QID) | ORAL | Status: DC | PRN

## 2018-12-26 MED ORDER — POTASSIUM CHLORIDE CRYS ER 20 MEQ PO TBCR
20.0000 meq | EXTENDED_RELEASE_TABLET | Freq: Every day | ORAL | Status: DC
  Administered 2018-12-26 – 2018-12-28 (×3): 20 meq via ORAL
  Filled 2018-12-26 (×3): qty 1

## 2018-12-26 MED ORDER — LACTATED RINGERS IV SOLN
INTRAVENOUS | Status: DC
  Administered 2018-12-26: 08:00:00 via INTRAVENOUS

## 2018-12-26 MED ORDER — OXYCODONE HCL 5 MG PO TABS
5.0000 mg | ORAL_TABLET | ORAL | Status: DC | PRN
  Administered 2018-12-26 – 2018-12-27 (×3): 5 mg via ORAL
  Filled 2018-12-26 (×3): qty 1

## 2018-12-26 MED ORDER — MIDAZOLAM HCL 2 MG/2ML IJ SOLN
1.0000 mg | Freq: Once | INTRAMUSCULAR | Status: AC
  Administered 2018-12-26: 1 mg via INTRAVENOUS

## 2018-12-26 MED ORDER — CLONAZEPAM 0.5 MG PO TABS
1.0000 mg | ORAL_TABLET | Freq: Every day | ORAL | Status: DC
  Administered 2018-12-27 – 2018-12-28 (×2): 1 mg via ORAL
  Filled 2018-12-26 (×3): qty 2

## 2018-12-26 MED ORDER — FENTANYL CITRATE (PF) 100 MCG/2ML IJ SOLN
INTRAMUSCULAR | Status: AC
  Administered 2018-12-26: 50 ug via INTRAVENOUS
  Filled 2018-12-26: qty 2

## 2018-12-26 MED ORDER — MORPHINE SULFATE (PF) 2 MG/ML IV SOLN: 1.0000 mg | INTRAVENOUS | Status: DC | PRN

## 2018-12-26 MED ORDER — METHOCARBAMOL 500 MG PO TABS
500.0000 mg | ORAL_TABLET | Freq: Four times a day (QID) | ORAL | Status: DC | PRN
  Administered 2018-12-26 – 2018-12-27 (×2): 500 mg via ORAL
  Filled 2018-12-26 (×2): qty 1

## 2018-12-26 MED ORDER — SODIUM CHLORIDE 0.9 % IV SOLN
INTRAVENOUS | Status: DC | PRN
  Administered 2018-12-26: 25 ug/min via INTRAVENOUS

## 2018-12-26 MED ORDER — FENTANYL CITRATE (PF) 100 MCG/2ML IJ SOLN
50.0000 ug | Freq: Once | INTRAMUSCULAR | Status: AC
  Administered 2018-12-26: 50 ug via INTRAVENOUS

## 2018-12-26 MED ORDER — HYDROXYZINE HCL 25 MG PO TABS
25.0000 mg | ORAL_TABLET | Freq: Three times a day (TID) | ORAL | Status: DC
  Administered 2018-12-26 – 2018-12-28 (×6): 25 mg via ORAL
  Filled 2018-12-26 (×6): qty 1

## 2018-12-26 MED ORDER — CHLORHEXIDINE GLUCONATE CLOTH 2 % EX PADS: 6.0000 | MEDICATED_PAD | Freq: Once | CUTANEOUS | Status: DC

## 2018-12-26 MED ORDER — HYDROXYZINE PAMOATE 25 MG PO CAPS
25.0000 mg | ORAL_CAPSULE | Freq: Three times a day (TID) | ORAL | Status: DC
  Filled 2018-12-26: qty 1

## 2018-12-26 MED ORDER — ZOLPIDEM TARTRATE 5 MG PO TABS: 5.0000 mg | ORAL_TABLET | Freq: Every evening | ORAL | Status: DC | PRN

## 2018-12-26 MED ORDER — KCL IN DEXTROSE-NACL 20-5-0.45 MEQ/L-%-% IV SOLN
INTRAVENOUS | Status: AC
  Administered 2018-12-26: 15:00:00 via INTRAVENOUS
  Filled 2018-12-26 (×2): qty 1000

## 2018-12-26 SURGICAL SUPPLY — 47 items
ADH SKN CLS APL DERMABOND .7 (GAUZE/BANDAGES/DRESSINGS) ×1
BINDER BREAST LRG (GAUZE/BANDAGES/DRESSINGS) IMPLANT
BINDER BREAST XLRG (GAUZE/BANDAGES/DRESSINGS) IMPLANT
BINDER BREAST XXLRG (GAUZE/BANDAGES/DRESSINGS) ×1 IMPLANT
BIOPATCH RED 1 DISK 7.0 (GAUZE/BANDAGES/DRESSINGS) ×1 IMPLANT
CANISTER SUCT 3000ML PPV (MISCELLANEOUS) ×4 IMPLANT
CHLORAPREP W/TINT 26ML (MISCELLANEOUS) ×2 IMPLANT
CLIP VESOCCLUDE MED 24/CT (CLIP) ×1 IMPLANT
COVER SURGICAL LIGHT HANDLE (MISCELLANEOUS) ×2 IMPLANT
COVER WAND RF STERILE (DRAPES) ×2 IMPLANT
DERMABOND ADVANCED (GAUZE/BANDAGES/DRESSINGS) ×1
DERMABOND ADVANCED .7 DNX12 (GAUZE/BANDAGES/DRESSINGS) ×1 IMPLANT
DRAPE CHEST BREAST 15X10 FENES (DRAPES) ×1 IMPLANT
DRAPE UNIVERSAL PACK (DRAPES) ×1 IMPLANT
DRSG PAD ABDOMINAL 8X10 ST (GAUZE/BANDAGES/DRESSINGS) ×2 IMPLANT
DRSG TEGADERM 4X4.75 (GAUZE/BANDAGES/DRESSINGS) ×1 IMPLANT
ELECT BLADE 4.0 EZ CLEAN MEGAD (MISCELLANEOUS) ×2
ELECT REM PT RETURN 9FT ADLT (ELECTROSURGICAL) ×2
ELECTRODE BLDE 4.0 EZ CLN MEGD (MISCELLANEOUS) IMPLANT
ELECTRODE REM PT RTRN 9FT ADLT (ELECTROSURGICAL) ×2 IMPLANT
GAUZE SPONGE 4X4 12PLY STRL (GAUZE/BANDAGES/DRESSINGS) ×2 IMPLANT
GLOVE BIO SURGEON STRL SZ 6 (GLOVE) ×2 IMPLANT
GLOVE INDICATOR 6.5 STRL GRN (GLOVE) ×2 IMPLANT
GOWN STRL REUS W/ TWL LRG LVL3 (GOWN DISPOSABLE) ×1 IMPLANT
GOWN STRL REUS W/TWL 2XL LVL3 (GOWN DISPOSABLE) ×2 IMPLANT
GOWN STRL REUS W/TWL LRG LVL3 (GOWN DISPOSABLE) ×6
ILLUMINATOR WAVEGUIDE N/F (MISCELLANEOUS) IMPLANT
KIT BASIN OR (CUSTOM PROCEDURE TRAY) ×2 IMPLANT
KIT TURNOVER KIT B (KITS) ×2 IMPLANT
LIGHT WAVEGUIDE WIDE FLAT (MISCELLANEOUS) ×1 IMPLANT
NDL SPNL 18GX3.5 QUINCKE PK (NEEDLE) ×1 IMPLANT
NEEDLE SPNL 18GX3.5 QUINCKE PK (NEEDLE) IMPLANT
NS IRRIG 1000ML POUR BTL (IV SOLUTION) ×2 IMPLANT
PACK GENERAL/GYN (CUSTOM PROCEDURE TRAY) ×2 IMPLANT
PAD ARMBOARD 7.5X6 YLW CONV (MISCELLANEOUS) ×2 IMPLANT
PENCIL SMOKE EVACUATOR (MISCELLANEOUS) ×2 IMPLANT
SLEEVE SUCTION 125 (MISCELLANEOUS) ×1 IMPLANT
STRIP CLOSURE SKIN 1/2X4 (GAUZE/BANDAGES/DRESSINGS) ×2 IMPLANT
SUT ETHILON 2 0 FS 18 (SUTURE) ×2 IMPLANT
SUT MON AB 4-0 PC3 18 (SUTURE) ×2 IMPLANT
SUT SILK 2 0 (SUTURE) ×2
SUT SILK 2-0 18XBRD TIE 12 (SUTURE) IMPLANT
SUT VIC AB 3-0 SH 8-18 (SUTURE) ×4 IMPLANT
SYR 50ML LL SCALE MARK (SYRINGE) ×2 IMPLANT
TOWEL OR 17X24 6PK STRL BLUE (TOWEL DISPOSABLE) ×2 IMPLANT
TOWEL OR 17X26 10 PK STRL BLUE (TOWEL DISPOSABLE) ×2 IMPLANT
TUBE CONNECTING 12X1/4 (SUCTIONS) ×1 IMPLANT

## 2018-12-26 NOTE — Op Note (Signed)
Right prophylactic simple mastectomy  Indications: This patient presents with history of left breast cancer, s/p left mastectomy, high risk of recurrent cancer, desire for symmetry.  Pre-operative Diagnosis: prior left breast cancer, high risk for recurrent cancer, desire for symmetry  Post-operative Diagnosis: same  Surgeon: [place]:  Dewaine Oats, RNFA  Anesthesia: General endotracheal anesthesia and pectoral block  ASA Class: 2  Procedure Details  The patient was seen in the Holding Room. The risks, benefits, complications, treatment options, and expected outcomes were discussed with the patient. The possibilities of reaction to medication, pulmonary aspiration, bleeding, infection, the need for additional procedures, failure to diagnose a condition, and creating a complication requiring transfusion or operation were discussed with the patient. The patient concurred with the proposed plan, giving informed consent.  The site of surgery properly noted/marked. The patient was taken to Operating Room # 9, identified as Kristi [name] and the procedure verified as Right prophylactic mastectomy. A Time Out was held and the above information confirmed.    After induction of anesthesia, the bilateral chest was prepped and draped in standard fashion.   The borders of the breast were identified and marked.  The incisions of the breast were drawn out to make sure incision lines were equidistant in length.    The superior incision was made with the #10 blade.  Mastectomy hooks were used to provide elevation of the skin edges, and the cautery was used to create the mastectomy flaps.  The dissection was taken to the fascia of the pectoralis major.  The penetrating vessels were clipped as needed.  The superior flap was taken medially to the lateral sternal border, superiorly to the inferior border of the clavicle.  The inferior flap was similarly created, inferiorly to the inframammary fold  and laterally to the border of the latissimus.  The breast was taken off including the pectoralis fascia and the axillary tail marked.    The wound was irrigated. One 19 Blake drain was placed laterally.   Hemostasis was achieved with cautery. There was too much redundant skin, so the skin was trimmed. The wound was irrigated and closed with 3-0 Vicryl deep dermal interrupted sutures and 4-0 Vicryl subcuticular closure in layers.    Sterile dressings were applied. At the end of the operation, all sponge, instrument, and needle counts were correct.  Findings: grossly clear surgical margins  Estimated Blood Loss: 50          Drains: 19 Fr blake drain                 Specimens: right breast with excess skin         Complications:  None; patient tolerated the procedure well.         Disposition: PACU - hemodynamically stable.         Condition: stable

## 2018-12-26 NOTE — Progress Notes (Signed)
Pt new admit from PACU s/p right prophylactic simple mastectomy, wound site with steri-strips, abd pad, breast binder, wound site dry and intact, with right one JP drain, alert and oriented, no complain of pain at this time, she voided prior to transfer.

## 2018-12-26 NOTE — Progress Notes (Signed)
Pt stated she took her Gabapentin at home the morning of surgery.  Did not give Gabapentin the morning of surgery.

## 2018-12-26 NOTE — Anesthesia Preprocedure Evaluation (Addendum)
Anesthesia Evaluation  Patient identified by MRN, date of birth, ID band Patient awake    Reviewed: Allergy & Precautions, H&P , NPO status , Patient's Chart, lab work & pertinent test results  Airway Mallampati: II   Neck ROM: full    Dental  (+) Dental Advidsory Given   Pulmonary neg pulmonary ROS,    breath sounds clear to auscultation       Cardiovascular hypertension,  Rhythm:regular Rate:Normal     Neuro/Psych PSYCHIATRIC DISORDERS Anxiety Depression TIA   GI/Hepatic   Endo/Other    Renal/GU      Musculoskeletal  (+) Arthritis ,   Abdominal   Peds  Hematology   Anesthesia Other Findings   Reproductive/Obstetrics                            Anesthesia Physical Anesthesia Plan  ASA: II  Anesthesia Plan: General   Post-op Pain Management:  Regional for Post-op pain   Induction: Intravenous  PONV Risk Score and Plan: 3 and Ondansetron, Dexamethasone, Midazolam and Treatment may vary due to age or medical condition  Airway Management Planned: LMA  Additional Equipment:   Intra-op Plan:   Post-operative Plan:   Informed Consent: I have reviewed the patients History and Physical, chart, labs and discussed the procedure including the risks, benefits and alternatives for the proposed anesthesia with the patient or authorized representative who has indicated his/her understanding and acceptance.       Plan Discussed with: CRNA, Anesthesiologist and Surgeon  Anesthesia Plan Comments:         Anesthesia Quick Evaluation

## 2018-12-26 NOTE — Interval H&P Note (Signed)
Surgery reviewed with patient.  Plan right prophylactic mastectomy.  Questions answered, risks reviewed.

## 2018-12-26 NOTE — Transfer of Care (Signed)
Immediate Anesthesia Transfer of Care Note  Patient: Kristi [name]  Procedure(s) Performed: RIGHT BREAST PROPHYLATIC MASTECTOMY (Right Breast)  Patient Location: PACU  Anesthesia Type:GA combined with regional for post-op pain  Level of Consciousness: drowsy  Airway & Oxygen Therapy: Patient Spontanous Breathing and Patient connected to nasal cannula oxygen  Post-op Assessment: Report given to RN and Post -op Vital signs reviewed and stable  Post vital signs: Reviewed and stable  Last Vitals:  Vitals Value Taken Time  BP 128/88 12/26/2018 12:22 PM  Temp    Pulse 73 12/26/2018 12:23 PM  Resp 11 12/26/2018 12:23 PM  SpO2 97 % 12/26/2018 12:23 PM  Vitals shown include unvalidated device data.  Last Pain:  Vitals:   12/26/18 0733  TempSrc:   PainSc: 0-No pain         Complications: No apparent anesthesia complications

## 2018-12-26 NOTE — Anesthesia Procedure Notes (Signed)
Anesthesia Regional Block: Pectoralis block   Pre-Anesthetic Checklist: ,, timeout performed, Correct Patient, Correct Site, Correct Laterality, Correct Procedure, Correct Position, site marked, Risks and benefits discussed,  Surgical consent,  Pre-op evaluation,  At surgeon's request and post-op pain management  Laterality: Right  Prep: chloraprep       Needles:  Injection technique: Single-shot  Needle Type: Echogenic Needle     Needle Length: 9cm  Needle Gauge: 21     Additional Needles:   Narrative:  Start time: 12/26/2018 8:41 AM End time: 12/26/2018 8:47 AM Injection made incrementally with aspirations every 5 mL.  Performed by: Personally  Anesthesiologist: Albertha Ghee, MD  Additional Notes: Pt tolerated the procedure well.

## 2018-12-26 NOTE — Discharge Instructions (Signed)
[facility] °[phone] ° °MASTECTOMY: POST OP INSTRUCTIONS ° °Always review your discharge instruction sheet given to you by the facility where your surgery was performed. °IF YOU HAVE DISABILITY OR FAMILY LEAVE FORMS, YOU MUST BRING THEM TO THE OFFICE FOR PROCESSING.   °DO NOT GIVE THEM TO YOUR DOCTOR. °A prescription for pain medication may be given to you upon discharge.  Take your pain medication as prescribed, if needed.  If narcotic pain medicine is not needed, then you may take acetaminophen (Tylenol) or ibuprofen (Advil) as needed. °1. Take your usually prescribed medications unless otherwise directed. °2. If you need a refill on your pain medication, please contact your pharmacy.  They will contact our office to request authorization.  Prescriptions will not be filled after 5pm or on week-ends. °3. You should follow a light diet the first few days after arrival home, such as soup and crackers, etc.  Resume your normal diet the day after surgery. °4. Most patients will experience some swelling and bruising on the chest and underarm.  Ice packs will help.  Swelling and bruising can take several days to resolve.  °5. It is common to experience some constipation if taking pain medication after surgery.  Increasing fluid intake and taking a stool softener (such as Colace) will usually help or prevent this problem from occurring.  A mild laxative (Milk of Magnesia or Miralax) should be taken according to package instructions if there are no bowel movements after 48 hours. °6. Unless discharge instructions indicate otherwise, leave your bandage dry and in place until your next appointment in 3-5 days.  You may take a limited sponge bath.  No tube baths or showers until the drains are removed.  You may have steri-strips (small skin tapes) in place directly over the incision.  These strips should be left on the skin for 7-10 days.  If your surgeon used skin glue on the incision, you may  shower in 24 hours.  The glue will flake off over the next 2-3 weeks.  Any sutures or staples will be removed at the office during your follow-up visit. °7. DRAINS:  If you have drains in place, it is important to keep a list of the amount of drainage produced each day in your drains.  Before leaving the hospital, you should be instructed on drain care.  Call our office if you have any questions about your drains. °8. ACTIVITIES:  You may resume regular (light) daily activities beginning the next day--such as daily self-care, walking, climbing stairs--gradually increasing activities as tolerated.  You may have sexual intercourse when it is comfortable.  Refrain from any heavy lifting or straining until approved by your doctor. °a. You may drive when you are no longer taking prescription pain medication, you can comfortably wear a seatbelt, and you can safely maneuver your car and apply brakes. °b. RETURN TO WORK:  __________________________________________________________ °9. You should see your doctor in the office for a follow-up appointment approximately 3-5 days after your surgery.  Your doctor’s nurse will typically make your follow-up appointment when she calls you with your pathology report.  Expect your pathology report 2-3 business days after your surgery.  You may call to check if you do not hear from us after three days.   °10. OTHER INSTRUCTIONS: ______________________________________________________________________________________________ ____________________________________________________________________________________________ °WHEN TO CALL YOUR DOCTOR: °1. Fever over 101.0 °2. Nausea and/or vomiting °3. Extreme swelling or bruising °4. Continued bleeding from incision. °5. Increased pain, redness, or drainage from the incision. °  The clinic staff is available to answer your questions during regular business hours.  Please don’t hesitate to call and ask to speak to one of the nurses for clinical  concerns.  If you have a medical emergency, go to the nearest emergency room or call 911.  A surgeon from [facility] is always on call at the hospital. °[address], [address], [place], [place]  [address] ? [address] [address], [place],    [address] °[phone] ? [phone] ? FAX [phone] °Web site: [email or web address] °

## 2018-12-26 NOTE — Anesthesia Procedure Notes (Signed)
Procedure Name: LMA Insertion Date/Time: 12/26/2018 9:50 AM Performed by: Colin Benton, CRNA Pre-anesthesia Checklist: Patient identified, Emergency Drugs available, Suction available and Patient being monitored Patient Re-evaluated:Patient Re-evaluated prior to induction Oxygen Delivery Method: Circle system utilized Preoxygenation: Pre-oxygenation with 100% oxygen Induction Type: IV induction Ventilation: Mask ventilation without difficulty LMA: LMA inserted LMA Size: 4.0 Number of attempts: 1 Placement Confirmation: positive ETCO2 and breath sounds checked- equal and bilateral Tube secured with: Tape Dental Injury: Teeth and Oropharynx as per pre-operative assessment

## 2018-12-27 ENCOUNTER — Encounter (HOSPITAL_COMMUNITY): Payer: Self-pay | Admitting: General Surgery

## 2018-12-27 DIAGNOSIS — Z9012 Acquired absence of left breast and nipple: Secondary | ICD-10-CM | POA: Diagnosis not present

## 2018-12-27 DIAGNOSIS — Z79811 Long term (current) use of aromatase inhibitors: Secondary | ICD-10-CM | POA: Diagnosis not present

## 2018-12-27 DIAGNOSIS — Z7982 Long term (current) use of aspirin: Secondary | ICD-10-CM | POA: Diagnosis not present

## 2018-12-27 DIAGNOSIS — Z4001 Encounter for prophylactic removal of breast: Secondary | ICD-10-CM | POA: Diagnosis not present

## 2018-12-27 DIAGNOSIS — Z8673 Personal history of transient ischemic attack (TIA), and cerebral infarction without residual deficits: Secondary | ICD-10-CM | POA: Diagnosis not present

## 2018-12-27 DIAGNOSIS — R42 Dizziness and giddiness: Secondary | ICD-10-CM | POA: Diagnosis not present

## 2018-12-27 DIAGNOSIS — N6011 Diffuse cystic mastopathy of right breast: Secondary | ICD-10-CM | POA: Diagnosis not present

## 2018-12-27 DIAGNOSIS — Z79899 Other long term (current) drug therapy: Secondary | ICD-10-CM | POA: Diagnosis not present

## 2018-12-27 DIAGNOSIS — C50912 Malignant neoplasm of unspecified site of left female breast: Secondary | ICD-10-CM | POA: Diagnosis not present

## 2018-12-27 LAB — BASIC METABOLIC PANEL
Anion gap: 9 (ref 5–15)
BUN: 12 mg/dL (ref 8–23)
CO2: 25 mmol/L (ref 22–32)
Calcium: 8.8 mg/dL — ABNORMAL LOW (ref 8.9–10.3)
Chloride: 104 mmol/L (ref 98–111)
Creatinine, Ser: 1.09 mg/dL — ABNORMAL HIGH (ref 0.44–1.00)
GFR calc Af Amer: >60 mL/min (ref >60)
GFR calc non Af Amer: 54 mL/min — ABNORMAL LOW (ref >60)
Glucose, Bld: 153 mg/dL — ABNORMAL HIGH (ref 70–99)
Potassium: 4.6 mmol/L (ref 3.5–5.1)
Sodium: 138 mmol/L (ref 135–145)

## 2018-12-27 LAB — CBC
HCT: 30.2 % — ABNORMAL LOW (ref 36.0–46.0)
Hemoglobin: 9.5 g/dL — ABNORMAL LOW (ref 12.0–15.0)
MCH: 27.4 pg (ref 26.0–34.0)
MCHC: 31.5 g/dL (ref 30.0–36.0)
MCV: 87 fL (ref 80.0–100.0)
Platelets: 152 10*3/uL (ref 150–400)
RBC: 3.47 MIL/uL — ABNORMAL LOW (ref 3.87–5.11)
RDW: 14.6 % (ref 11.5–15.5)
WBC: 7.8 10*3/uL (ref 4.0–10.5)
nRBC: 0 % (ref 0.0–0.2)

## 2018-12-27 MED ORDER — SODIUM CHLORIDE 0.9 % IV BOLUS
500.0000 mL | Freq: Once | INTRAVENOUS | Status: AC
  Administered 2018-12-27: 500 mL via INTRAVENOUS

## 2018-12-27 NOTE — Progress Notes (Signed)
1 Day Post-Op   Subjective/Chief Complaint: Pt is a bit unsteady on her feet and BP is soft.     Objective: Vital signs in last 24 hours: Temp:  [97.6 F (36.4 C)-98.4 F (36.9 C)] 98.4 F (36.9 C) (02/21 1133) Pulse Rate:  [71-107] 107 (02/21 1133) Resp:  [12-18] 18 (02/21 1133) BP: (96-126)/(65-84) 96/66 (02/21 1133) SpO2:  [94 %-97 %] 96 % (02/21 1133) Weight:  [83.5 kg] 83.5 kg (02/20 1448) Last BM Date: 12/24/18  Intake/Output from previous day: 02/20 0701 - 02/21 0700 In: 737 [P.O.:240; I.V.:297; IV Piggyback:200] Out: 304 [Drains:204; Blood:100] Intake/Output this shift: Total I/O In: 300 [P.O.:300] Out: 1 [Urine:1]  General appearance: alert, cooperative and mild distress Resp: breathing comfortably Chest wall: right sided chest wall tenderness, as expected.  Also no flap hematoma appreciated.  drain is serosang Extremities: extremities normal, atraumatic, no cyanosis or edema  Lab Results:  Recent Labs    12/27/18 0450  WBC 7.8  HGB 9.5*  HCT 30.2*  PLT 152   BMET Recent Labs    12/27/18 0450  NA 138  K 4.6  CL 104  CO2 25  GLUCOSE 153*  BUN 12  CREATININE 1.09*  CALCIUM 8.8*   PT/INR No results for input(s): LABPROT, INR in the last 72 hours. ABG No results for input(s): PHART, HCO3 in the last 72 hours.  Invalid input(s): PCO2, PO2  Studies/Results: No results found.  Anti-infectives: Anti-infectives (From admission, onward)   Start     Dose/Rate Route Frequency Ordered Stop   12/26/18 1800  ceFAZolin (ANCEF) IVPB 2g/100 mL premix     2 g 200 mL/hr over 30 Minutes Intravenous Every 8 hours 12/26/18 1449 12/26/18 2131   12/26/18 0730  ceFAZolin (ANCEF) IVPB 2g/100 mL premix     2 g 200 mL/hr over 30 Minutes Intravenous On call to O.R. 12/26/18 3570 12/26/18 0953      Assessment/Plan: s/p Procedure(s): RIGHT BREAST PROPHYLATIC MASTECTOMY (Right) Will delay d/c until tomorrow given soft BP and unsteadiness.    Will also give a  bolus of NS.     LOS: 0 days    Kristi [name] 12/27/2018

## 2018-12-27 NOTE — Anesthesia Postprocedure Evaluation (Signed)
Anesthesia Post Note  Patient: Kristi [name]  Procedure(s) Performed: RIGHT BREAST PROPHYLATIC MASTECTOMY (Right Breast)     Patient location during evaluation: PACU Anesthesia Type: General Level of consciousness: awake and alert Pain management: pain level controlled Vital Signs Assessment: post-procedure vital signs reviewed and stable Respiratory status: spontaneous breathing, nonlabored ventilation, respiratory function stable and patient connected to nasal cannula oxygen Cardiovascular status: blood pressure returned to baseline and stable Postop Assessment: no apparent nausea or vomiting Anesthetic complications: no    Last Vitals:  Vitals:   12/27/18 0151 12/27/18 0437  BP: 113/68 108/70  Pulse: 74 71  Resp: 16   Temp: 36.6 C 36.7 C  SpO2: 97%     Last Pain:  Vitals:   12/27/18 0957  TempSrc:   PainSc: 0-No pain                 [name]

## 2018-12-28 DIAGNOSIS — Z9012 Acquired absence of left breast and nipple: Secondary | ICD-10-CM | POA: Diagnosis not present

## 2018-12-28 DIAGNOSIS — Z79811 Long term (current) use of aromatase inhibitors: Secondary | ICD-10-CM | POA: Diagnosis not present

## 2018-12-28 DIAGNOSIS — C50912 Malignant neoplasm of unspecified site of left female breast: Secondary | ICD-10-CM | POA: Diagnosis not present

## 2018-12-28 DIAGNOSIS — Z4001 Encounter for prophylactic removal of breast: Secondary | ICD-10-CM | POA: Diagnosis not present

## 2018-12-28 DIAGNOSIS — Z79899 Other long term (current) drug therapy: Secondary | ICD-10-CM | POA: Diagnosis not present

## 2018-12-28 DIAGNOSIS — R42 Dizziness and giddiness: Secondary | ICD-10-CM | POA: Diagnosis not present

## 2018-12-28 DIAGNOSIS — Z8673 Personal history of transient ischemic attack (TIA), and cerebral infarction without residual deficits: Secondary | ICD-10-CM | POA: Diagnosis not present

## 2018-12-28 DIAGNOSIS — Z7982 Long term (current) use of aspirin: Secondary | ICD-10-CM | POA: Diagnosis not present

## 2018-12-28 DIAGNOSIS — N6011 Diffuse cystic mastopathy of right breast: Secondary | ICD-10-CM | POA: Diagnosis not present

## 2018-12-28 LAB — CBC
HCT: 25.2 % — ABNORMAL LOW (ref 36.0–46.0)
HEMOGLOBIN: 8 g/dL — AB (ref 12.0–15.0)
MCH: 27.6 pg (ref 26.0–34.0)
MCHC: 31.7 g/dL (ref 30.0–36.0)
MCV: 86.9 fL (ref 80.0–100.0)
NRBC: 0 % (ref 0.0–0.2)
Platelets: 126 10*3/uL — ABNORMAL LOW (ref 150–400)
RBC: 2.9 MIL/uL — ABNORMAL LOW (ref 3.87–5.11)
RDW: 14.6 % (ref 11.5–15.5)
WBC: 7.4 10*3/uL (ref 4.0–10.5)

## 2018-12-28 LAB — BASIC METABOLIC PANEL
Anion gap: 5 (ref 5–15)
BUN: 14 mg/dL (ref 8–23)
CO2: 27 mmol/L (ref 22–32)
Calcium: 8.2 mg/dL — ABNORMAL LOW (ref 8.9–10.3)
Chloride: 106 mmol/L (ref 98–111)
Creatinine, Ser: 1.21 mg/dL — ABNORMAL HIGH (ref 0.44–1.00)
GFR calc Af Amer: 55 mL/min — ABNORMAL LOW (ref >60)
GFR calc non Af Amer: 48 mL/min — ABNORMAL LOW (ref >60)
Glucose, Bld: 137 mg/dL — ABNORMAL HIGH (ref 70–99)
Potassium: 3.5 mmol/L (ref 3.5–5.1)
SODIUM: 138 mmol/L (ref 135–145)

## 2018-12-28 MED ORDER — METHOCARBAMOL 500 MG PO TABS: 500.0000 mg | ORAL_TABLET | Freq: Four times a day (QID) | ORAL | 1 refills | Status: DC | PRN

## 2018-12-28 MED ORDER — OXYCODONE HCL 5 MG PO TABS: 5.0000 mg | ORAL_TABLET | ORAL | 0 refills | Status: DC | PRN

## 2018-12-28 NOTE — Progress Notes (Signed)
Discharged pt to home. Alert and oriented. Discharge instructions given and explained.

## 2018-12-28 NOTE — Discharge Summary (Signed)
Physician Discharge Summary  Patient ID: Kristi [name] MRN: [id number] DOB/AGE: 1956/06/01 63 y.o.  Admit date: 12/26/2018 Discharge date: 12/28/2018  Admission Diagnoses:  History of left breast cancer/ high risk for recurrent cancer  Discharge Diagnoses: same Active Problems:   S/P mastectomy, right   Discharged Condition: good  Hospital Course: Right prophylactic simple mastectomy on 12/26/18.  The patient felt unsteady and light-headed on POD#1, so she stayed an extra night.  Decreasing drain output.  Hgb slight decrease, but no indication for transfusion.   Treatments: surgery: right prophylactic simple mastectomy  Discharge Exam: Blood pressure 98/62, pulse 73, temperature 97.6 F (36.4 C), temperature source Oral, resp. rate 18, height 5\' 2"  (1.575 m), weight 83.5 kg, last menstrual period 01/20/2013, SpO2 96 %. WDWN in NAD  Skin flaps viable, no hematoma or seroma Drain - minimal serosanguinous output  Disposition: Discharge disposition: 01-Home or Self Care       Discharge Instructions    Call MD for:  persistant nausea and vomiting   Complete by:  As directed    Call MD for:  redness, tenderness, or signs of infection (pain, swelling, redness, odor or green/yellow discharge around incision site)   Complete by:  As directed    Call MD for:  severe uncontrolled pain   Complete by:  As directed    Call MD for:  temperature >100.4   Complete by:  As directed    Diet general   Complete by:  As directed    Driving Restrictions   Complete by:  As directed    Do not drive while taking pain medications   Increase activity slowly   Complete by:  As directed    May shower / Bathe   Complete by:  As directed      Allergies as of 12/28/2018   No Known Allergies     Medication List    STOP taking these medications   HYDROcodone-acetaminophen 5-325 MG tablet Commonly known as:  NORCO   predniSONE 10 MG tablet Commonly known as:  DELTASONE     TAKE these  medications   amitriptyline 100 MG tablet Commonly known as:  ELAVIL Take 100 mg by mouth at bedtime.   anastrozole 1 MG tablet Commonly known as:  ARIMIDEX TAKE 1 TABLET (1 MG) BY MOUTH DAILY IN THE MORNING What changed:    how much to take  how to take this  when to take this   aspirin EC 81 MG tablet Take 81 mg by mouth daily at 3 pm.   clonazePAM 1 MG tablet Commonly known as:  KLONOPIN TAKE 1 TABLET BY MOUTH EVERY MORNING AND 1/2 TABLET AT BEDTIME What changed:  See the new instructions.   gabapentin 800 MG tablet Commonly known as:  NEURONTIN Take 1 tablet (800 mg total) by mouth 3 (three) times daily.   gabapentin 800 MG tablet Commonly known as:  NEURONTIN TAKE 1 TABLET BY MOUTH THREE TIMES A DAY   hydrOXYzine 25 MG capsule Commonly known as:  VISTARIL Take 25 mg by mouth 3 (three) times daily.   KLOR-CON M20 20 MEQ tablet Generic drug:  potassium chloride SA TAKE 1 TABLET BY MOUTH EVERY DAY What changed:  how much to take   LATUDA 80 MG Tabs tablet Generic drug:  lurasidone Take 80 mg by mouth at bedtime.   Lidocaine-Prilocaine (Bulk) 2.5-2.5 % Crea 1 Dose by Does not apply route 2 (two) times daily as needed.   lidocaine-prilocaine cream Commonly known  as:  EMLA APPLY TO AFFECTED AREA TWICE DAILY AS DIRECTED What changed:  See the new instructions.   linaclotide 145 MCG Caps capsule Commonly known as:  LINZESS Take 1 capsule (145 mcg total) by mouth daily before breakfast.   methocarbamol 500 MG tablet Commonly known as:  ROBAXIN Take 1 tablet (500 mg total) by mouth every 6 (six) hours as needed for muscle spasms.   multivitamin with minerals Tabs tablet Take 1 tablet by mouth daily.   oxyCODONE 5 MG immediate release tablet Commonly known as:  Oxy IR/ROXICODONE Take 1-2 tablets (5-10 mg total) by mouth every 4 (four) hours as needed for moderate pain.   SUPER B COMPLEX PO Take 1 tablet by mouth daily.   valsartan-hydrochlorothiazide  320-25 MG tablet Commonly known as:  DIOVAN-HCT TAKE 1 TABLET BY MOUTH EVERY DAY IN THE MORNING What changed:  See the new instructions.   Vitamin B-12 5000 MCG Subl Place 5,000 mcg under the tongue daily.      Follow-up Information    Stark Klein, MD In 2 weeks.   Specialty:  General Surgery Contact information: [address] [address] [address] [phone]           Signed: Maia Petties 12/28/2018, 8:18 AM

## 2019-01-24 ENCOUNTER — Encounter: Payer: Self-pay | Admitting: Hematology and Oncology

## 2019-01-24 ENCOUNTER — Encounter: Payer: Self-pay | Admitting: *Deleted

## 2019-01-29 ENCOUNTER — Other Ambulatory Visit: Payer: Self-pay | Admitting: Hematology and Oncology

## 2019-01-29 DIAGNOSIS — C50212 Malignant neoplasm of upper-inner quadrant of left female breast: Secondary | ICD-10-CM

## 2019-01-31 ENCOUNTER — Other Ambulatory Visit: Payer: Self-pay | Admitting: Family Medicine

## 2019-02-03 ENCOUNTER — Encounter: Payer: Self-pay | Admitting: Family Medicine

## 2019-02-20 ENCOUNTER — Encounter: Payer: Self-pay | Admitting: Genetic Counselor

## 2019-02-20 DIAGNOSIS — Z1379 Encounter for other screening for genetic and chromosomal anomalies: Secondary | ICD-10-CM

## 2019-02-20 HISTORY — DX: Encounter for other screening for genetic and chromosomal anomalies: Z13.79

## 2019-02-24 ENCOUNTER — Other Ambulatory Visit: Payer: Self-pay | Admitting: Family Medicine

## 2019-03-18 ENCOUNTER — Other Ambulatory Visit: Payer: Self-pay

## 2019-03-18 ENCOUNTER — Other Ambulatory Visit: Payer: Self-pay | Admitting: Family Medicine

## 2019-03-18 ENCOUNTER — Ambulatory Visit (INDEPENDENT_AMBULATORY_CARE_PROVIDER_SITE_OTHER): Payer: Medicare Other | Admitting: Family Medicine

## 2019-03-18 DIAGNOSIS — R739 Hyperglycemia, unspecified: Secondary | ICD-10-CM

## 2019-03-18 DIAGNOSIS — F329 Major depressive disorder, single episode, unspecified: Secondary | ICD-10-CM

## 2019-03-18 DIAGNOSIS — G5692 Unspecified mononeuropathy of left upper limb: Secondary | ICD-10-CM | POA: Diagnosis not present

## 2019-03-18 DIAGNOSIS — F32A Depression, unspecified: Secondary | ICD-10-CM

## 2019-03-18 DIAGNOSIS — I10 Essential (primary) hypertension: Secondary | ICD-10-CM

## 2019-03-18 DIAGNOSIS — F419 Anxiety disorder, unspecified: Secondary | ICD-10-CM

## 2019-03-18 DIAGNOSIS — G6289 Other specified polyneuropathies: Secondary | ICD-10-CM

## 2019-03-18 MED ORDER — HYDROCODONE-ACETAMINOPHEN 5-325 MG PO TABS: 1.0000 | ORAL_TABLET | Freq: Four times a day (QID) | ORAL | 0 refills | Status: DC | PRN

## 2019-03-18 NOTE — Assessment & Plan Note (Signed)
Follows with Leitha Bleak of psychiatry and is doing well

## 2019-03-18 NOTE — Assessment & Plan Note (Signed)
hgba1c acceptable, minimize simple carbs. Increase exercise as tolerated.  

## 2019-03-18 NOTE — Assessment & Plan Note (Signed)
Using Gabapentin 800 tid and still has bad days at times and needs a Hydrocodone prn which is helpful. Is given a refill and warned about potential side effects and the importance of not taking it with other meds that can cause somnolence

## 2019-03-18 NOTE — Progress Notes (Signed)
Virtual Visit via Video Note  I connected with Kristi [name] on 03/18/19 at  1:20 PM EDT by a video enabled telemedicine application and verified that I am speaking with the correct person using two identifiers.  Location: Patient: home Provider: home   I discussed the limitations of evaluation and management by telemedicine and the availability of in person appointments. The patient expressed understanding and agreed to proceed. Kristi [name], CMA was able to get patient set up on video visit platform     Subjective:    Patient ID: Kristi [name], female    DOB: 12-02-1955, 63 y.o.   MRN: [id number]  No chief complaint on file.   HPI Patient is in today for follow-up on chronic medical concerns including peripheral neuropathy in her left hand, hypertension and hyperglycemia.  She is following with psychiatry for her anxiety depression and doing adequate in that regard despite the pandemic.  He is able to self isolate for the most part.  Her hand pain gets severe enough at times to need hydrocodone but she uses it sparingly.  She is taking gabapentin 3 times daily with some results.  No recent fall or injury.  No recent febrile illness, polyuria or polydipsia.  No recent hospitalization.  Past Medical History:  Diagnosis Date   Anemia    Iron deficinecy anemia   Anemia 05/14/2017   Anxiety    Anxiety and depression 05/15/2014   Arthritis    Breast cancer ([place])    left ,last radiation 2'15, last chemo 8'14   Constipation 11/13/2016   Depression    History of chicken pox    History of radiation therapy 09/09/13-10/28/13   45 gray to left breast, lumpectomy cavity boosted to 63 gray   HTN (hypertension) 11/13/2016   Hyperglycemia 01/09/2016   Hyperlipidemia 05/14/2017   Hypertension    MRSA (methicillin resistant Staphylococcus aureus) 2009   right groin area-no issues now. 04-07-14 PCR screen negative today.   Neuropathy    Preventative health care 11/13/2016    Past  Surgical History:  Procedure Laterality Date   ANAL SPHINCTEROTOMY  04/2011   APPENDECTOMY  1980   AXILLARY LYMPH NODE DISSECTION Left 02/04/2013   Procedure: LEFT AXILLARY LYMPH NODE DISSECTION;  Surgeon: Stark Klein, MD;  Location: [facility];  Service: General;  Laterality: Left;  End: 4098   BREAST LUMPECTOMY WITH NEEDLE LOCALIZATION Left 02/04/2013   Procedure: LEFT BREAST LUMPECTOMY WITH NEEDLE LOCALIZATION;  Surgeon: Stark Klein, MD;  Location: [facility];  Service: General;  Laterality: Left;   BREAST SURGERY     Lumpectomy in april 2014   [place]  04/2011   ligation   MASTECTOMY Left 02/15/2017   PORT-A-CATH REMOVAL N/A 04/16/2014   Procedure: REMOVAL PORT-A-CATH;  Surgeon: Stark Klein, MD;  Location: [facility];  Service: General;  Laterality: N/A;   PORTACATH PLACEMENT Right 02/04/2013   Procedure: INSERTION PORT-A-CATH;  Surgeon: Stark Klein, MD;  Location: [facility];  Service: General;  Laterality: Right;  Start Time: 1191.   SHOULDER ARTHROSCOPY WITH ROTATOR CUFF REPAIR AND SUBACROMIAL DECOMPRESSION Left 02/24/2014   Procedure: SHOULDER ARTHROSCOPY WITH ROTATOR CUFF REPAIR AND SUBACROMIAL DECOMPRESSION;  Surgeon: Meredith Pel, MD;  Location: [facility];  Service: Orthopedics;  Laterality: Left;  LEFT SHOULDER DIAGNOSTIC OPERATIVE ARTHROSCOPY, SUBACROMIAL DECOMPRESSION, ROTATOR CUFF TEAR REPAIR   SIMPLE MASTECTOMY WITH AXILLARY SENTINEL NODE BIOPSY Left 02/15/2017   Procedure: LEFT MASTECTOMY;  Surgeon: Stark Klein, MD;  Location: [facility];  Service: General;  Laterality:  Left;   TOTAL MASTECTOMY Right 12/26/2018   Procedure: RIGHT BREAST PROPHYLATIC MASTECTOMY;  Surgeon: Stark Klein, MD;  Location: [facility];  Service: General;  Laterality: Right;    Family History  Problem Relation Age of Onset   Lung cancer Father    Hypertension Father    Thyroid cancer Father        dx in his 26s   Cancer Father        lung, thyroid, smoker   Breast cancer Paternal Aunt 54   Colon  cancer Paternal Aunt        dx in her 50x   Cervical cancer Paternal Aunt        dzx in her 69s   Ovarian cancer Cousin        dx in her lage 41s   Breast cancer Cousin        maternal first cousin, once removed; dx in her late 84s   Breast cancer Cousin        maternal first cousin once removed; dx in late 53s   Hypertension Mother    Diabetes Mother    Dementia Mother    Hypertension Brother    Seizures Brother        Alcohol induced.   Alcohol abuse Brother        drinker, smoker   Cancer Paternal Uncle        oral cancer   Kidney cancer Paternal Grandmother    Arthritis Daughter        back surgery   Cancer Cousin        several paternal cousins with brain cancer, leukemia, and other cancers   Cancer Sister        stomach    Social History   Socioeconomic History   Marital status: Married    Spouse name: [name]   Number of children: 1   Years of education: 12   Highest education level: Not on file  Occupational History   Occupation: INVENTORY Hotel manager: [employer]  Social Designer, fashion/clothing strain: Not on file   Food insecurity:    Worry: Not on file    Inability: Not on file   Transportation needs:    Medical: Not on file    Non-medical: Not on file  Tobacco Use   Smoking status: Never Smoker   Smokeless tobacco: Never Used  Substance and Sexual Activity   Alcohol use: No   Drug use: No   Sexual activity: Yes    Comment: lives with husband, disability/retirement. [employer], no dietary restrictions  Lifestyle   Physical activity:    Days per week: Not on file    Minutes per session: Not on file   Stress: Not on file  Relationships   Social connections:    Talks on phone: Not on file    Gets together: Not on file    Attends religious service: Not on file    Active member of club or organization: Not on file    Attends meetings of clubs or organizations: Not on file     Relationship status: Not on file   Intimate partner violence:    Fear of current or ex partner: Not on file    Emotionally abused: Not on file    Physically abused: Not on file    Forced sexual activity: Not on file  Other Topics Concern   Not on file  Social History Narrative  Patient is married [name]) and lives at home with her husband.   Patient has one daughter.   Patient is working at [employer]   Patient has a 12th grade education.   Patient drinks very little caffeine.    Outpatient Medications Prior to Visit  Medication Sig Dispense Refill   amitriptyline (ELAVIL) 100 MG tablet Take 100 mg by mouth at bedtime.  2   anastrozole (ARIMIDEX) 1 MG tablet TAKE 1 TABLET (1 MG) BY MOUTH DAILY IN THE MORNING (Patient taking differently: Take 1 mg by mouth daily. TAKE 1 TABLET (1 MG) BY MOUTH DAILY IN THE MORNING) 90 tablet 3   aspirin EC 81 MG tablet Take 81 mg by mouth daily at 3 pm.      B Complex-C (SUPER B COMPLEX PO) Take 1 tablet by mouth daily.     clonazePAM (KLONOPIN) 1 MG tablet TAKE 1 TABLET BY MOUTH EVERY MORNING AND 1/2 TABLET AT BEDTIME (Patient taking differently: Take 0.5-1 mg by mouth See admin instructions. Take 1 mg by mouth in the morning and 0.5 mg at bedtime) 45 tablet 1   Cyanocobalamin (VITAMIN B-12) 5000 MCG SUBL Place 5,000 mcg under the tongue daily.     gabapentin (NEURONTIN) 800 MG tablet TAKE 1 TABLET BY MOUTH THREE TIMES A DAY 270 tablet 1   hydrOXYzine (VISTARIL) 25 MG capsule Take 25 mg by mouth 3 (three) times daily.   1   KLOR-CON M20 20 MEQ tablet TAKE 1 TABLET BY MOUTH EVERY DAY (Patient taking differently: Take 20 mEq by mouth daily. ) 90 tablet 1   LATUDA 80 MG TABS tablet Take 80 mg by mouth at bedtime.   1   lidocaine-prilocaine (EMLA) cream APPLY EXTERNALLY TO THE AFFECTED AREA TWICE DAILY AS DIRECTED 30 g 0   Lidocaine-Prilocaine, Bulk, 2.5-2.5 % CREA 1 Dose by Does not apply route 2 (two) times daily as needed. (Patient not taking:  Reported on 12/06/2018) 5800 g 2   linaclotide (LINZESS) 145 MCG CAPS capsule Take 1 capsule (145 mcg total) by mouth daily before breakfast. (Patient not taking: Reported on 12/06/2018) 30 capsule 0   methocarbamol (ROBAXIN) 500 MG tablet Take 1 tablet (500 mg total) by mouth every 6 (six) hours as needed for muscle spasms. 20 tablet 1   Multiple Vitamin (MULTIVITAMIN WITH MINERALS) TABS tablet Take 1 tablet by mouth daily.     valsartan-hydrochlorothiazide (DIOVAN-HCT) 320-25 MG tablet TAKE 1 TABLET BY MOUTH EVERY DAY IN THE MORNING 90 tablet 1   oxyCODONE (OXY IR/ROXICODONE) 5 MG immediate release tablet Take 1-2 tablets (5-10 mg total) by mouth every 4 (four) hours as needed for moderate pain. 30 tablet 0   No facility-administered medications prior to visit.     No Known Allergies  Review of Systems  Constitutional: Negative for fever and malaise/fatigue.  HENT: Negative for congestion.   Eyes: Negative for blurred vision.  Respiratory: Negative for shortness of breath.   Cardiovascular: Negative for chest pain, palpitations and leg swelling.  Gastrointestinal: Negative for abdominal pain, blood in stool and nausea.  Genitourinary: Negative for dysuria and frequency.  Musculoskeletal: Positive for joint pain. Negative for falls.  Skin: Negative for rash.  Neurological: Positive for sensory change. Negative for dizziness, loss of consciousness and headaches.  Endo/Heme/Allergies: Negative for environmental allergies.  Psychiatric/Behavioral: Negative for depression. The patient is nervous/anxious.        Objective:    Physical Exam Constitutional:      Appearance: Normal appearance.  HENT:  Head: Normocephalic and atraumatic.     Nose: Nose normal.  Pulmonary:     Effort: Pulmonary effort is normal.  Neurological:     Mental Status: She is alert and oriented to person, place, and time.  Psychiatric:        Mood and Affect: Mood normal.        Behavior: Behavior  normal.     LMP 01/20/2013  Wt Readings from Last 3 Encounters:  12/26/18 184 lb (83.5 kg)  12/19/18 184 lb (83.5 kg)  12/02/18 188 lb 3.2 oz (85.4 kg)    Diabetic Foot Exam - Simple   No data filed     Lab Results  Component Value Date   WBC 7.4 12/28/2018   HGB 8.0 (L) 12/28/2018   HCT 25.2 (L) 12/28/2018   PLT 126 (L) 12/28/2018   GLUCOSE 137 (H) 12/28/2018   CHOL 183 06/18/2018   TRIG 106.0 06/18/2018   HDL 49.80 06/18/2018   LDLCALC 112 (H) 06/18/2018   ALT 17 06/18/2018   AST 19 06/18/2018   NA 138 12/28/2018   K 3.5 12/28/2018   CL 106 12/28/2018   CREATININE 1.21 (H) 12/28/2018   BUN 14 12/28/2018   CO2 27 12/28/2018   TSH 2.14 06/18/2018   INR 1.02 07/19/2013   HGBA1C 6.0 06/18/2018    Lab Results  Component Value Date   TSH 2.14 06/18/2018   Lab Results  Component Value Date   WBC 7.4 12/28/2018   HGB 8.0 (L) 12/28/2018   HCT 25.2 (L) 12/28/2018   MCV 86.9 12/28/2018   PLT 126 (L) 12/28/2018   Lab Results  Component Value Date   NA 138 12/28/2018   K 3.5 12/28/2018   CHLORIDE 104 01/22/2017   CO2 27 12/28/2018   GLUCOSE 137 (H) 12/28/2018   BUN 14 12/28/2018   CREATININE 1.21 (H) 12/28/2018   BILITOT 0.3 06/18/2018   ALKPHOS 82 06/18/2018   AST 19 06/18/2018   ALT 17 06/18/2018   PROT 7.3 06/18/2018   ALBUMIN 4.2 06/18/2018   CALCIUM 8.2 (L) 12/28/2018   ANIONGAP 5 12/28/2018   EGFR 67 (L) 01/22/2017   GFR 57.18 (L) 06/18/2018   Lab Results  Component Value Date   CHOL 183 06/18/2018   Lab Results  Component Value Date   HDL 49.80 06/18/2018   Lab Results  Component Value Date   LDLCALC 112 (H) 06/18/2018   Lab Results  Component Value Date   TRIG 106.0 06/18/2018   Lab Results  Component Value Date   CHOLHDL 4 06/18/2018   Lab Results  Component Value Date   HGBA1C 6.0 06/18/2018       Assessment & Plan:   Problem List Items Addressed This Visit    Anxiety and depression    Follows with Leitha Bleak  of psychiatry and is doing well      Neuropathy of hand, left    Using Gabapentin 800 tid and still has bad days at times and needs a Hydrocodone prn which is helpful. Is given a refill and warned about potential side effects and the importance of not taking it with other meds that can cause somnolence      Relevant Medications   HYDROcodone-acetaminophen (NORCO/VICODIN) 5-325 MG tablet   Hyperglycemia    hgba1c acceptable, minimize simple carbs. Increase exercise as tolerated.      HTN (hypertension)    no changes to meds. Encouraged heart healthy diet such as the DASH diet  and exercise as tolerated.        Other Visit Diagnoses    Other polyneuropathy       Relevant Medications   HYDROcodone-acetaminophen (NORCO/VICODIN) 5-325 MG tablet      I have discontinued Kristi [name]'s oxyCODONE. I am also having her maintain her B Complex-C (SUPER B COMPLEX PO), aspirin EC, amitriptyline, multivitamin with minerals, Vitamin B-12, clonazePAM, Latuda, hydrOXYzine, linaclotide, Lidocaine-Prilocaine (Bulk), [place]-Con M20, anastrozole, methocarbamol, gabapentin, valsartan-hydrochlorothiazide, lidocaine-prilocaine, and HYDROcodone-acetaminophen.  Meds ordered this encounter  Medications   HYDROcodone-acetaminophen (NORCO/VICODIN) 5-325 MG tablet    Sig: Take 1-2 tablets by mouth every 6 (six) hours as needed. For neuropathy/pain.    Dispense:  30 tablet    Refill:  0    discussed the assessment and treatment plan with the patient. The patient was provided an opportunity to ask questions and all were answered. The patient agreed with the plan and demonstrated an understanding of the instructions.   The patient was advised to call back or seek an in-person evaluation if the symptoms worsen or if the condition fails to improve as anticipated.  I provided 25 minutes of non-face-to-face time during this encounter.   [name], MD

## 2019-03-18 NOTE — Assessment & Plan Note (Signed)
no changes to meds. Encouraged heart healthy diet such as the DASH diet and exercise as tolerated.  

## 2019-03-24 ENCOUNTER — Encounter: Payer: Self-pay | Admitting: Family Medicine

## 2019-03-26 NOTE — Telephone Encounter (Signed)
Please advise 

## 2019-04-24 ENCOUNTER — Other Ambulatory Visit: Payer: Self-pay | Admitting: Family Medicine

## 2019-05-01 NOTE — Progress Notes (Signed)
Virtual Visit via Video Note  I connected with patient on 05/02/19 at 11:00 AM EDT by audio enabled telemedicine application and verified that I am speaking with the correct person using two identifiers.   THIS ENCOUNTER IS A VIRTUAL VISIT DUE TO COVID-19 - PATIENT WAS NOT SEEN IN THE OFFICE. PATIENT HAS CONSENTED TO VIRTUAL VISIT / TELEMEDICINE VISIT   Location of patient: home  Location of provider: office  I discussed the limitations of evaluation and management by telemedicine and the availability of in person appointments. The patient expressed understanding and agreed to proceed.   Subjective:   Kristi [name] is a 63 y.o. female who presents for Medicare Annual (Subsequent) preventive examination.  Review of Systems: No ROS.  Medicare Wellness Virtual Visit.  Visual/audio telehealth visit, UTA vital signs.   See social history for additional risk factors.   Sleep patterns: no issues Home Safety/Smoke Alarms: Feels safe in home. Smoke alarms in place.  Lives with husband in 2 story home. Master on 2nd. Walk-in shower with grab rails.    Female:   Pap- 06/18/18    Mammo- 12/05/18         CCS- 02/26/15 with 5 yr recall     Objective:     Vitals: BP 126/88 Comment: pt reported  Temp (!) 97.3 F (36.3 C)   LMP 01/20/2013     Advanced Directives 05/02/2019 12/27/2018 12/19/2018 04/30/2018 05/16/2017 04/03/2017 02/15/2017  Does Patient Have a Medical Advance Directive? No No No No No No No  Would patient like information on creating a medical advance directive? No - Patient declined No - Patient declined No - Patient declined Yes (MAU/Ambulatory/Procedural Areas - Information given) - No - Patient declined No - Patient declined  Pre-existing out of facility DNR order (yellow form or pink MOST form) - - - - - - -    Tobacco Social History   Tobacco Use  Smoking Status Never Smoker  Smokeless Tobacco Never Used     Counseling given: Not Answered   Clinical Intake: Pain :  No/denies pain    Past Medical History:  Diagnosis Date  . Anemia    Iron deficinecy anemia  . Anemia 05/14/2017  . Anxiety   . Anxiety and depression 05/15/2014  . Arthritis   . Breast cancer ([place])    left ,last radiation 2'15, last chemo 8'14  . Constipation 11/13/2016  . Depression   . History of chicken pox   . History of radiation therapy 09/09/13-10/28/13   45 gray to left breast, lumpectomy cavity boosted to 63 gray  . HTN (hypertension) 11/13/2016  . Hyperglycemia 01/09/2016  . Hyperlipidemia 05/14/2017  . Hypertension   . MRSA (methicillin resistant Staphylococcus aureus) 2009   right groin area-no issues now. 04-07-14 PCR screen negative today.  . Neuropathy   . Preventative health care 11/13/2016   Past Surgical History:  Procedure Laterality Date  . ANAL SPHINCTEROTOMY  04/2011  . APPENDECTOMY  1980  . AXILLARY LYMPH NODE DISSECTION Left 02/04/2013   Procedure: LEFT AXILLARY LYMPH NODE DISSECTION;  Surgeon: Stark Klein, MD;  Location: [facility];  Service: General;  Laterality: Left;  End: 2423  . BREAST LUMPECTOMY WITH NEEDLE LOCALIZATION Left 02/04/2013   Procedure: LEFT BREAST LUMPECTOMY WITH NEEDLE LOCALIZATION;  Surgeon: Stark Klein, MD;  Location: [facility];  Service: General;  Laterality: Left;  . BREAST SURGERY     Lumpectomy in april 2014  . HEMORRHOID SURGERY  04/2011   ligation  . MASTECTOMY  Left 02/15/2017  . PORT-A-CATH REMOVAL N/A 04/16/2014   Procedure: REMOVAL PORT-A-CATH;  Surgeon: Stark Klein, MD;  Location: [facility];  Service: General;  Laterality: N/A;  . PORTACATH PLACEMENT Right 02/04/2013   Procedure: INSERTION PORT-A-CATH;  Surgeon: Stark Klein, MD;  Location: [facility];  Service: General;  Laterality: Right;  Start Time: 1610.  Marland Kitchen SHOULDER ARTHROSCOPY WITH ROTATOR CUFF REPAIR AND SUBACROMIAL DECOMPRESSION Left 02/24/2014   Procedure: SHOULDER ARTHROSCOPY WITH ROTATOR CUFF REPAIR AND SUBACROMIAL DECOMPRESSION;  Surgeon: Meredith Pel, MD;  Location: [facility];  Service:  Orthopedics;  Laterality: Left;  LEFT SHOULDER DIAGNOSTIC OPERATIVE ARTHROSCOPY, SUBACROMIAL DECOMPRESSION, ROTATOR CUFF TEAR REPAIR  . SIMPLE MASTECTOMY WITH AXILLARY SENTINEL NODE BIOPSY Left 02/15/2017   Procedure: LEFT MASTECTOMY;  Surgeon: Stark Klein, MD;  Location: [facility];  Service: General;  Laterality: Left;  . TOTAL MASTECTOMY Right 12/26/2018   Procedure: RIGHT BREAST PROPHYLATIC MASTECTOMY;  Surgeon: Stark Klein, MD;  Location: [facility];  Service: General;  Laterality: Right;   Family History  Problem Relation Age of Onset  . Lung cancer Father   . Hypertension Father   . Thyroid cancer Father        dx in his 69s  . Cancer Father        lung, thyroid, smoker  . Breast cancer Paternal Aunt 41  . Colon cancer Paternal Aunt        dx in her 50x  . Cervical cancer Paternal Aunt        dzx in her 2s  . Ovarian cancer Cousin        dx in her lage 31s  . Breast cancer Cousin        maternal first cousin, once removed; dx in her late 20s  . Breast cancer Cousin        maternal first cousin once removed; dx in late 29s  . Hypertension Mother   . Diabetes Mother   . Dementia Mother   . Hypertension Brother   . Seizures Brother        Alcohol induced.  . Alcohol abuse Brother        drinker, smoker  . Cancer Paternal Uncle        oral cancer  . Kidney cancer Paternal Grandmother   . Arthritis Daughter        back surgery  . Cancer Cousin        several paternal cousins with brain cancer, leukemia, and other cancers  . Cancer Sister        stomach   Social History   Socioeconomic History  . Marital status: Married    Spouse name: [name]  . Number of children: 1  . Years of education: 24  . Highest education level: Not on file  Occupational History  . Occupation: INVENTORY Hotel manager: [employer]  Social Needs  . Financial resource strain: Not on file  . Food insecurity    Worry: Not on file    Inability: Not on file  . Transportation  needs    Medical: Not on file    Non-medical: Not on file  Tobacco Use  . Smoking status: Never Smoker  . Smokeless tobacco: Never Used  Substance and Sexual Activity  . Alcohol use: No  . Drug use: No  . Sexual activity: Yes    Comment: lives with husband, disability/retirement. [employer], no dietary restrictions  Lifestyle  . Physical activity    Days  per week: Not on file    Minutes per session: Not on file  . Stress: Not on file  Relationships  . Social Herbalist on phone: Not on file    Gets together: Not on file    Attends religious service: Not on file    Active member of club or organization: Not on file    Attends meetings of clubs or organizations: Not on file    Relationship status: Not on file  Other Topics Concern  . Not on file  Social History Narrative   Patient is married [name]) and lives at home with her husband.   Patient has one daughter.   Patient is working at [employer]   Patient has a 12th grade education.   Patient drinks very little caffeine.    Outpatient Encounter Medications as of 05/02/2019  Medication Sig  . amitriptyline (ELAVIL) 100 MG tablet Take 100 mg by mouth at bedtime.  Marland Kitchen anastrozole (ARIMIDEX) 1 MG tablet TAKE 1 TABLET (1 MG) BY MOUTH DAILY IN THE MORNING (Patient taking differently: Take 1 mg by mouth daily. TAKE 1 TABLET (1 MG) BY MOUTH DAILY IN THE MORNING)  . aspirin EC 81 MG tablet Take 81 mg by mouth daily at 3 pm.   . B Complex-C (SUPER B COMPLEX PO) Take 1 tablet by mouth daily.  . clonazePAM (KLONOPIN) 1 MG tablet TAKE 1 TABLET BY MOUTH EVERY MORNING AND 1/2 TABLET AT BEDTIME (Patient taking differently: Take 0.5-1 mg by mouth See admin instructions. Take 1 mg by mouth in the morning and 0.5 mg at bedtime)  . Cyanocobalamin (VITAMIN B-12) 5000 MCG SUBL Place 5,000 mcg under the tongue daily.  Marland Kitchen gabapentin (NEURONTIN) 800 MG tablet TAKE 1 TABLET BY MOUTH THREE TIMES A DAY  . HYDROcodone-acetaminophen  (NORCO/VICODIN) 5-325 MG tablet Take 1-2 tablets by mouth every 6 (six) hours as needed. For neuropathy/pain.  . hydrOXYzine (VISTARIL) 25 MG capsule Take 25 mg by mouth 3 (three) times daily.   Marland Kitchen KLOR-CON M20 20 MEQ tablet TAKE 1 TABLET BY MOUTH EVERY DAY (Patient taking differently: Take 20 mEq by mouth daily. )  . LATUDA 80 MG TABS tablet Take 80 mg by mouth at bedtime.   . lidocaine-prilocaine (EMLA) cream APPLY EXTERNALLY TO THE AFFECTED AREA TWICE DAILY AS DIRECTED  . Lidocaine-Prilocaine, Bulk, 2.5-2.5 % CREA 1 Dose by Does not apply route 2 (two) times daily as needed.  . linaclotide (LINZESS) 145 MCG CAPS capsule Take 1 capsule (145 mcg total) by mouth daily before breakfast.  . methocarbamol (ROBAXIN) 500 MG tablet Take 1 tablet (500 mg total) by mouth every 6 (six) hours as needed for muscle spasms.  . Multiple Vitamin (MULTIVITAMIN WITH MINERALS) TABS tablet Take 1 tablet by mouth daily.  . valsartan-hydrochlorothiazide (DIOVAN-HCT) 320-25 MG tablet TAKE 1 TABLET BY MOUTH EVERY DAY IN THE MORNING  . [DISCONTINUED] prochlorperazine (COMPAZINE) 10 MG tablet Take 1 tablet (10 mg total) by mouth every 6 (six) hours as needed (Nausea or vomiting).  . [DISCONTINUED] prochlorperazine (COMPAZINE) 25 MG suppository Place 1 suppository (25 mg total) rectally every 12 (twelve) hours as needed for nausea.   No facility-administered encounter medications on file as of 05/02/2019.     Activities of Daily Living In your present state of health, do you have any difficulty performing the following activities: 12/27/2018 12/19/2018  Hearing? N N  Vision? N N  Difficulty concentrating or making decisions? Y N  Comment at times -  Walking or climbing stairs? N N  Dressing or bathing? N N  Doing errands, shopping? N N  Some recent data might be hidden    Patient Care Team: Mosie Lukes, MD as PCP - General (Family Medicine) Nicholas Lose, MD as Consulting Physician (Hematology and Oncology)  Lin Landsman, MD as Consulting Physician (Family Medicine) Inocencio Homes, DPM as Consulting Physician (Podiatry) Gery Pray, MD as Consulting Physician (Radiation Oncology) Dan Humphreys as Consulting Physician (Nurse Practitioner)    Assessment:   This is a routine wellness examination for [place]. Physical assessment deferred to PCP.  Exercise Activities and Dietary recommendations   Diet (meal preparation, eat out, water intake, caffeinated beverages, dairy products, fruits and vegetables):  Breakfast: cereal and toast. Water Lunch: skips Dinner:   [employer]   Drinks 4 glasses of water per day.  Goals    . Increase physical activity       Fall Risk Fall Risk  05/02/2019 04/30/2018 10/12/2016 09/22/2014 05/28/2014  Falls in the past year? 1 No No Yes Yes  Number falls in past yr: 1 - - - 1  Injury with Fall? 0 - - No No   Depression Screen PHQ 2/9 Scores 05/02/2019 04/30/2018 10/12/2016  PHQ - 2 Score 0 1 0  Some encounter information is confidential and restricted. Go to Review Flowsheets activity to see all data.     Cognitive Function Ad8 score reviewed for issues:  Issues making decisions:no  Less interest in hobbies / activities:no  Repeats questions, stories (family complaining):no  Trouble using ordinary gadgets (microwave, computer, phone):no  Forgets the month or year: no  Mismanaging finances: no  Remembering appts:no  Daily problems with thinking and/or memory:no Ad8 score is=0   MMSE - Mini Mental State Exam 04/30/2018  Orientation to time 5  Orientation to Place 5  Registration 3  Attention/ Calculation 5  Recall 1  Language- name 2 objects 2  Language- repeat 1  Language- follow 3 step command 3  Language- read & follow direction 1  Write a sentence 1  Copy design 1  Total score 28         There is no immunization history on file for this patient.  Screening Tests Health Maintenance  Topic Date Due  . Hepatitis C Screening  1956/06/06   . HIV Screening  01/27/1970  . TETANUS/TDAP  01/27/1974  . INFLUENZA VACCINE  06/07/2019  . MAMMOGRAM  12/05/2020  . PAP SMEAR-Modifier  06/18/2021  . COLONOSCOPY  02/25/2025     Plan:   See you next year!  Continue to eat heart healthy diet (full of fruits, vegetables, whole grains, lean protein, water--limit salt, fat, and sugar intake) and increase physical activity as tolerated.  Continue doing brain stimulating activities (puzzles, reading, adult coloring books, staying active) to keep memory sharp.    I have personally reviewed and noted the following in the patient's chart:   . Medical and social history . Use of alcohol, tobacco or illicit drugs  . Current medications and supplements . Functional ability and status . Nutritional status . Physical activity . Advanced directives . List of other physicians . Hospitalizations, surgeries, and ER visits in previous 12 months . Vitals . Screenings to include cognitive, depression, and falls . Referrals and appointments  In addition, I have reviewed and discussed with patient certain preventive protocols, quality metrics, and best practice recommendations. A written personalized care plan for preventive services as well as general preventive health recommendations were provided to  patient.     Naaman Plummer [place], [place]  05/02/2019

## 2019-05-02 ENCOUNTER — Other Ambulatory Visit: Payer: Self-pay | Admitting: Family Medicine

## 2019-05-02 ENCOUNTER — Other Ambulatory Visit: Payer: Self-pay

## 2019-05-02 ENCOUNTER — Other Ambulatory Visit: Payer: Self-pay | Admitting: *Deleted

## 2019-05-02 ENCOUNTER — Ambulatory Visit (INDEPENDENT_AMBULATORY_CARE_PROVIDER_SITE_OTHER): Payer: Medicare Other | Admitting: *Deleted

## 2019-05-02 ENCOUNTER — Encounter: Payer: Self-pay | Admitting: *Deleted

## 2019-05-02 VITALS — BP 126/88 | Temp 97.3°F

## 2019-05-02 DIAGNOSIS — Z0000 Encounter for general adult medical examination without abnormal findings: Secondary | ICD-10-CM

## 2019-05-02 DIAGNOSIS — G6289 Other specified polyneuropathies: Secondary | ICD-10-CM

## 2019-05-02 MED ORDER — HYDROCODONE-ACETAMINOPHEN 5-325 MG PO TABS: 1.0000 | ORAL_TABLET | Freq: Four times a day (QID) | ORAL | 0 refills | Status: DC | PRN

## 2019-05-02 NOTE — Telephone Encounter (Signed)
Pt requesting refill on Hydrocodone

## 2019-05-02 NOTE — Patient Instructions (Signed)
See you next year!  Continue to eat heart healthy diet (full of fruits, vegetables, whole grains, lean protein, water--limit salt, fat, and sugar intake) and increase physical activity as tolerated.  Continue doing brain stimulating activities (puzzles, reading, adult coloring books, staying active) to keep memory sharp.    Kristi [name] , Thank you for taking time to come for your Medicare Wellness Visit. I appreciate your ongoing commitment to your health goals. Please review the following plan we discussed and let me know if I can assist you in the future.   These are the goals we discussed: Goals    . Increase physical activity       This is a list of the screening recommended for you and due dates:  Health Maintenance  Topic Date Due  .  Hepatitis C: One time screening is recommended by Center for Disease Control  (CDC) for  adults born from [birth year] through 1966.   [birth year]  . HIV Screening  01/27/1970  . Tetanus Vaccine  01/27/1974  . Flu Shot  06/07/2019  . Mammogram  12/05/2020  . Pap Smear  06/18/2021  . Colon Cancer Screening  02/25/2025    Health Maintenance After Age 5 After age 57, you are at a higher risk for certain long-term diseases and infections as well as injuries from falls. Falls are a major cause of broken bones and head injuries in people who are older than age 64. Getting regular preventive care can help to keep you healthy and well. Preventive care includes getting regular testing and making lifestyle changes as recommended by your health care provider. Talk with your health care provider about:  Which screenings and tests you should have. A screening is a test that checks for a disease when you have no symptoms.  A diet and exercise plan that is right for you. What should I know about screenings and tests to prevent falls? Screening and testing are the best ways to find a health problem early. Early diagnosis and treatment give you the best chance of managing  medical conditions that are common after age 64. Certain conditions and lifestyle choices may make you more likely to have a fall. Your health care provider may recommend:  Regular vision checks. Poor vision and conditions such as cataracts can make you more likely to have a fall. If you wear glasses, make sure to get your prescription updated if your vision changes.  Medicine review. Work with your health care provider to regularly review all of the medicines you are taking, including over-the-counter medicines. Ask your health care provider about any side effects that may make you more likely to have a fall. Tell your health care provider if any medicines that you take make you feel dizzy or sleepy.  Osteoporosis screening. Osteoporosis is a condition that causes the bones to get weaker. This can make the bones weak and cause them to break more easily.  Blood pressure screening. Blood pressure changes and medicines to control blood pressure can make you feel dizzy.  Strength and balance checks. Your health care provider may recommend certain tests to check your strength and balance while standing, walking, or changing positions.  Foot health exam. Foot pain and numbness, as well as not wearing proper footwear, can make you more likely to have a fall.  Depression screening. You may be more likely to have a fall if you have a fear of falling, feel emotionally low, or feel unable to do activities that you used  to do.  Alcohol use screening. Using too much alcohol can affect your balance and may make you more likely to have a fall. What actions can I take to lower my risk of falls? General instructions  Talk with your health care provider about your risks for falling. Tell your health care provider if: ? You fall. Be sure to tell your health care provider about all falls, even ones that seem minor. ? You feel dizzy, sleepy, or off-balance.  Take over-the-counter and prescription medicines only  as told by your health care provider. These include any supplements.  Eat a healthy diet and maintain a healthy weight. A healthy diet includes low-fat dairy products, low-fat (lean) meats, and fiber from whole grains, beans, and lots of fruits and vegetables. Home safety  Remove any tripping hazards, such as rugs, cords, and clutter.  Install safety equipment such as grab bars in bathrooms and safety rails on stairs.  Keep rooms and walkways well-lit. Activity   Follow a regular exercise program to stay fit. This will help you maintain your balance. Ask your health care provider what types of exercise are appropriate for you.  If you need a cane or walker, use it as recommended by your health care provider.  Wear supportive shoes that have nonskid soles. Lifestyle  Do not drink alcohol if your health care provider tells you not to drink.  If you drink alcohol, limit how much you have: ? 0-1 drink a day for women. ? 0-2 drinks a day for men.  Be aware of how much alcohol is in your drink. In the [place] one drink equals one typical bottle of beer (12 oz), one-half glass of wine (5 oz), or one shot of hard liquor (1 oz).  Do not use any products that contain nicotine or tobacco, such as cigarettes and e-cigarettes. If you need help quitting, ask your health care provider. Summary  Having a healthy lifestyle and getting preventive care can help to protect your health and wellness after age 51.  Screening and testing are the best way to find a health problem early and help you avoid having a fall. Early diagnosis and treatment give you the best chance for managing medical conditions that are more common for people who are older than age 64.  Falls are a major cause of broken bones and head injuries in people who are older than age 64. Take precautions to prevent a fall at home.  Work with your health care provider to learn what changes you can make to improve your health and wellness  and to prevent falls. This information is not intended to replace advice given to you by your health care provider. Make sure you discuss any questions you have with your health care provider. Document Released: 09/05/2017 Document Revised: 09/05/2017 Document Reviewed: 09/05/2017 Elsevier Interactive Patient Education  2019 [employer].

## 2019-05-02 NOTE — Telephone Encounter (Signed)
I have sent the hydrocodone to her [facility]

## 2019-05-15 ENCOUNTER — Other Ambulatory Visit: Payer: Self-pay | Admitting: Family Medicine

## 2019-05-15 NOTE — Assessment & Plan Note (Signed)
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 followed by Taxol carboplatin later switched to [place] completed 08/21/2013, status post adjuvant radiation, currently on tamoxifen started 11/13/2013 Switched to anastrozole 03/10/2016 completed antiestrogen therapy by December 2019  Left mastectomy: 02/15/2017: Benign Right mastectomy: 12/26/2018: Benign  Breast cancer surveillance: No role of mammogram since she had bilateral mastectomies. Patient can be seen annually for long-term survivorship.

## 2019-05-21 NOTE — Progress Notes (Signed)
Patient Care Team: Mosie Lukes, MD as PCP - General (Family Medicine) Nicholas Lose, MD as Consulting Physician (Hematology and Oncology) Lin Landsman, MD as Consulting Physician (Family Medicine) Inocencio Homes, [place] as Consulting Physician (Podiatry) Gery Pray, MD as Consulting Physician (Radiation Oncology) Dan Humphreys as Consulting Physician (Nurse Practitioner)  DIAGNOSIS:    ICD-10-CM   1. Primary cancer of upper inner quadrant of left female breast ([place])  C50.212     SUMMARY OF ONCOLOGIC HISTORY: Oncology History  Primary cancer of upper inner quadrant of left female breast ([place])  01/24/2013 Initial Diagnosis   Ultrasound: Left breast no position 1 x 0.7 cm, 12:00 position 1.6 x 0.9 cm in addition 2.1 cm axillary lymph node: Biopsies of breast and axilla were positive for IDC high-grade;  ER weakly positive, PR negative, HER-2 negative   02/04/2013 Surgery   Left breast lumpectomy: 1.6 cm tumor 1/19 axillary lymph nodes positive, ER 6%, PR 0%, HER-2 negative, Ki-67 90%   03/05/2013 - 08/21/2013 Chemotherapy   Adjuvant chemotherapy with Adriamycin and Cytoxan followed by Taxol and [place] x9 followed by [place] x2   03/06/2013 Genetic Testing   Negative genetic testing on the Comprehensive Cancer Panel.  The Comprehensive Common Cancer Panel offered by [facility] includes sequencing and/or deletion duplication testing of the following 46 genes: APC, ATM, AXIN2, BAP1, BARD1, BMPR1A, BRCA1, BRCA2, BRIP1, CDH1, CDK4, CDKN2A, CHEK2, EPCAM, FANCC, FH, FLCN, HOXB13, MET, MITF,  MLH1, MSH2, MSH6, MUTYH, NBN, NF1, NTHL1,  PALB2, PMS2, POLD1, POLE, POT1, PTEN, RAD51C, RAD51D, RECQL, SCG5/GREM1, SDHB, SDHC, SDHD, SMAD4, STK11, TP53, TSC1, TSC2, and VHL.  There had been a RAD51C c.919G>A VUS identified that was reclassified to likely benign on February 20, 2019.  The original report date was Mar 06, 2013.   09/09/2013 - 10/28/2013 Radiation Therapy   Adjuvant radiation therapy   11/13/2013  - 10/2018 Anti-estrogen oral therapy   Tamoxifen 20 mg daily switched to anastrozole May 2017   02/15/2017 Surgery   Left mastectomy: No malignancy identified   12/26/2018 Surgery   Right mastectomy prophylactic: Benign     CHIEF COMPLIANT: Follow-up of left breast cancer on anastrozole  INTERVAL HISTORY: Kristi [name] is a 63 y.o. with above-mentioned history of left breast cancer treated with lumpectomy, adjuvant chemotherapy, and radiation. She completed 5 years of oral antiestrogen therapy with anastrozole in 10/2018. I last saw her a year ago. Mammogram on 12/05/18 showed no evidence of malignancy. She presents to the clinic today for annual follow-up.  She has limitation of range of motion of both her arms because of bilateral mastectomies. She has severe tightness in the shoulder areas. She is still fairly weak and is unable to do a whole lot of activity.  Denies any lumps or nodules in the chest wall or axilla. She is very sad because her brother is in hospice with cancer.  REVIEW OF SYSTEMS:   Constitutional: Denies fevers, chills or abnormal weight loss Eyes: Denies blurriness of vision Ears, nose, mouth, throat, and face: Denies mucositis or sore throat Respiratory: Denies cough, dyspnea or wheezes Cardiovascular: Denies palpitation, chest discomfort Gastrointestinal: Denies nausea, heartburn or change in bowel habits Skin: Denies abnormal skin rashes Lymphatics: Denies new lymphadenopathy or easy bruising Neurological: Denies numbness, tingling or new weaknesses Behavioral/Psych: Mood is stable, no new changes  Extremities: No lower extremity edema Breast: denies any pain or lumps or nodules in either breasts All other systems were reviewed with the patient and are negative.  I have reviewed the past medical history, past surgical history, social history and family history with the patient and they are unchanged from previous note.  ALLERGIES:  has No Known Allergies.   MEDICATIONS:  Current Outpatient Medications  Medication Sig Dispense Refill  . amitriptyline (ELAVIL) 100 MG tablet Take 100 mg by mouth at bedtime.  2  . anastrozole (ARIMIDEX) 1 MG tablet TAKE 1 TABLET (1 MG) BY MOUTH DAILY IN THE MORNING (Patient taking differently: Take 1 mg by mouth daily. TAKE 1 TABLET (1 MG) BY MOUTH DAILY IN THE MORNING) 90 tablet 3  . aspirin EC 81 MG tablet Take 81 mg by mouth daily at 3 pm.     . B Complex-C (SUPER B COMPLEX PO) Take 1 tablet by mouth daily.    . clonazePAM (KLONOPIN) 1 MG tablet TAKE 1 TABLET BY MOUTH EVERY MORNING AND 1/2 TABLET AT BEDTIME (Patient taking differently: Take 0.5-1 mg by mouth See admin instructions. Take 1 mg by mouth in the morning and 0.5 mg at bedtime) 45 tablet 1  . Cyanocobalamin (VITAMIN B-12) 5000 MCG SUBL Place 5,000 mcg under the tongue daily.    Marland Kitchen gabapentin (NEURONTIN) 800 MG tablet TAKE 1 TABLET BY MOUTH THREE TIMES A DAY 270 tablet 1  . HYDROcodone-acetaminophen (NORCO/VICODIN) 5-325 MG tablet Take 1-2 tablets by mouth every 6 (six) hours as needed. For neuropathy/pain. 30 tablet 0  . hydrOXYzine (VISTARIL) 25 MG capsule Take 25 mg by mouth 3 (three) times daily.   1  . KLOR-CON M20 20 MEQ tablet TAKE 1 TABLET BY MOUTH EVERY DAY 90 tablet 1  . LATUDA 80 MG TABS tablet Take 80 mg by mouth at bedtime.   1  . lidocaine-prilocaine (EMLA) cream APPLY EXTERNALLY TO THE AFFECTED AREA TWICE DAILY AS DIRECTED 30 g 0  . Lidocaine-Prilocaine, Bulk, 2.5-2.5 % CREA 1 Dose by Does not apply route 2 (two) times daily as needed. 5800 g 2  . linaclotide (LINZESS) 145 MCG CAPS capsule Take 1 capsule (145 mcg total) by mouth daily before breakfast. 30 capsule 0  . methocarbamol (ROBAXIN) 500 MG tablet Take 1 tablet (500 mg total) by mouth every 6 (six) hours as needed for muscle spasms. 20 tablet 1  . Multiple Vitamin (MULTIVITAMIN WITH MINERALS) TABS tablet Take 1 tablet by mouth daily.    . valsartan-hydrochlorothiazide (DIOVAN-HCT)  320-25 MG tablet TAKE 1 TABLET BY MOUTH EVERY DAY IN THE MORNING 90 tablet 1   No current facility-administered medications for this visit.     PHYSICAL EXAMINATION: ECOG PERFORMANCE STATUS: 1 - Symptomatic but completely ambulatory  Vitals:   05/22/19 1503  BP: 120/76  Pulse: 93  Resp: 17  Temp: 98.3 F (36.8 C)  SpO2: 99%   Filed Weights   05/22/19 1503  Weight: 174 lb 1.6 oz (79 kg)    GENERAL: alert, no distress and comfortable SKIN: skin color, texture, turgor are normal, no rashes or significant lesions EYES: normal, Conjunctiva are pink and non-injected, sclera clear OROPHARYNX: no exudate, no erythema and lips, buccal mucosa, and tongue normal  NECK: supple, thyroid normal size, non-tender, without nodularity LYMPH: no palpable lymphadenopathy in the cervical, axillary or inguinal LUNGS: clear to auscultation and percussion with normal breathing effort HEART: regular rate & rhythm and no murmurs and no lower extremity edema ABDOMEN: abdomen soft, non-tender and normal bowel sounds MUSCULOSKELETAL: no cyanosis of digits and no clubbing  NEURO: alert & oriented x 3 with fluent speech, no focal motor/sensory deficits  EXTREMITIES: No lower extremity edema BREAST: No palpable masses or nodules in either right or left breasts. No palpable axillary supraclavicular or infraclavicular adenopathy no breast tenderness or nipple discharge. (exam performed in the presence of a chaperone)  LABORATORY DATA:  I have reviewed the data as listed CMP Latest Ref Rng & Units 12/28/2018 12/27/2018 12/19/2018  Glucose 70 - 99 mg/dL 137(H) 153(H) 112(H)  BUN 8 - 23 mg/dL 14 12 11   Creatinine 0.44 - 1.00 mg/dL 1.21(H) 1.09(H) 1.20(H)  Sodium 135 - 145 mmol/L 138 138 142  Potassium 3.5 - 5.1 mmol/L 3.5 4.6 3.7  Chloride 98 - 111 mmol/L 106 104 103  CO2 22 - 32 mmol/L 27 25 27   Calcium 8.9 - 10.3 mg/dL 8.2(L) 8.8(L) 10.1  Total Protein 6.0 - 8.3 g/dL - - -  Total Bilirubin 0.2 - 1.2 mg/dL -  - -  Alkaline Phos 39 - 117 U/L - - -  AST 0 - 37 U/L - - -  ALT 0 - 35 U/L - - -    Lab Results  Component Value Date   WBC 7.4 12/28/2018   HGB 8.0 (L) 12/28/2018   HCT 25.2 (L) 12/28/2018   MCV 86.9 12/28/2018   PLT 126 (L) 12/28/2018   NEUTROABS 2.4 01/22/2017    ASSESSMENT & PLAN:  Primary cancer of upper 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 followed by Taxol carboplatin later switched to [place] completed 08/21/2013, status post adjuvant radiation, currently on tamoxifen started 11/13/2013 Switched to anastrozole 03/10/2016  Anastrozole toxicities: Tolerating it extremely well. Plan is to treat her for 7 years. I renewed her prescription today.  Left mastectomy: 02/15/2017: Benign Right mastectomy: 12/26/2018: Benign  Patient provided me with forms for disability insurance.  Breast cancer surveillance: No role of mammogram since she had bilateral mastectomies. Patient can be seen annually for long-term survivorship.  No orders of the defined types were placed in this encounter.  The patient has a good understanding of the overall plan. she agrees with it. she will call with any problems that may develop before the next visit here.  Rulon Eisenmenger, MD 05/22/2019  Julious Oka Dorshimer am acting as scribe for Dr. Nicholas Lose.  I have reviewed the above documentation for accuracy and completeness, and I agree with the above.

## 2019-05-22 ENCOUNTER — Inpatient Hospital Stay: Payer: Medicare Other | Attending: Hematology and Oncology | Admitting: Hematology and Oncology

## 2019-05-22 ENCOUNTER — Other Ambulatory Visit: Payer: Self-pay

## 2019-05-22 ENCOUNTER — Telehealth: Payer: Self-pay | Admitting: Hematology and Oncology

## 2019-05-22 DIAGNOSIS — Z923 Personal history of irradiation: Secondary | ICD-10-CM

## 2019-05-22 DIAGNOSIS — Z9013 Acquired absence of bilateral breasts and nipples: Secondary | ICD-10-CM | POA: Diagnosis not present

## 2019-05-22 DIAGNOSIS — Z170 Estrogen receptor positive status [ER+]: Secondary | ICD-10-CM | POA: Diagnosis not present

## 2019-05-22 DIAGNOSIS — C50212 Malignant neoplasm of upper-inner quadrant of left female breast: Secondary | ICD-10-CM | POA: Diagnosis not present

## 2019-05-22 DIAGNOSIS — Z9221 Personal history of antineoplastic chemotherapy: Secondary | ICD-10-CM

## 2019-05-22 DIAGNOSIS — Z79811 Long term (current) use of aromatase inhibitors: Secondary | ICD-10-CM

## 2019-05-22 MED ORDER — ANASTROZOLE 1 MG PO TABS: 1.0000 mg | ORAL_TABLET | Freq: Every day | ORAL | 3 refills | Status: DC

## 2019-05-22 NOTE — Telephone Encounter (Signed)
Gave avs and calendar ° °

## 2019-06-10 ENCOUNTER — Other Ambulatory Visit: Payer: Self-pay | Admitting: Family Medicine

## 2019-06-10 ENCOUNTER — Encounter: Payer: Self-pay | Admitting: Family Medicine

## 2019-06-10 DIAGNOSIS — G6289 Other specified polyneuropathies: Secondary | ICD-10-CM

## 2019-06-10 MED ORDER — HYDROCODONE-ACETAMINOPHEN 5-325 MG PO TABS: 1.0000 | ORAL_TABLET | Freq: Four times a day (QID) | ORAL | 0 refills | Status: DC | PRN

## 2019-06-10 NOTE — Telephone Encounter (Signed)
Medication refill: HYDROcodone-acetaminophen (NORCO/VICODIN) 5-325 MG tablet [[id number]]    Pharmacy:  [id number] - [place], [place] [phone] (Phone) [phone] (Fax)

## 2019-06-19 ENCOUNTER — Other Ambulatory Visit: Payer: Self-pay

## 2019-06-19 ENCOUNTER — Other Ambulatory Visit (INDEPENDENT_AMBULATORY_CARE_PROVIDER_SITE_OTHER): Payer: Medicare Other

## 2019-06-19 ENCOUNTER — Encounter: Payer: Self-pay | Admitting: Family Medicine

## 2019-06-19 ENCOUNTER — Ambulatory Visit (INDEPENDENT_AMBULATORY_CARE_PROVIDER_SITE_OTHER): Payer: Medicare Other | Admitting: Family Medicine

## 2019-06-19 VITALS — BP 120/78 | HR 97 | Temp 96.8°F | Resp 18 | Wt 176.0 lb

## 2019-06-19 DIAGNOSIS — G6289 Other specified polyneuropathies: Secondary | ICD-10-CM | POA: Diagnosis not present

## 2019-06-19 DIAGNOSIS — R739 Hyperglycemia, unspecified: Secondary | ICD-10-CM

## 2019-06-19 DIAGNOSIS — I10 Essential (primary) hypertension: Secondary | ICD-10-CM

## 2019-06-19 DIAGNOSIS — D649 Anemia, unspecified: Secondary | ICD-10-CM | POA: Diagnosis not present

## 2019-06-19 DIAGNOSIS — G5692 Unspecified mononeuropathy of left upper limb: Secondary | ICD-10-CM

## 2019-06-19 DIAGNOSIS — C50212 Malignant neoplasm of upper-inner quadrant of left female breast: Secondary | ICD-10-CM

## 2019-06-19 DIAGNOSIS — E785 Hyperlipidemia, unspecified: Secondary | ICD-10-CM

## 2019-06-19 LAB — TSH: TSH: 1.29 u[IU]/mL (ref 0.35–4.50)

## 2019-06-19 LAB — COMPREHENSIVE METABOLIC PANEL
ALT: 19 U/L (ref 0–35)
AST: 20 U/L (ref 0–37)
Albumin: 4.4 g/dL (ref 3.5–5.2)
Alkaline Phosphatase: 89 U/L (ref 39–117)
BUN: 15 mg/dL (ref 6–23)
CO2: 30 mEq/L (ref 19–32)
Calcium: 10.2 mg/dL (ref 8.4–10.5)
Chloride: 102 mEq/L (ref 96–112)
Creatinine, Ser: 1.15 mg/dL (ref 0.40–1.20)
GFR: 57.41 mL/min — ABNORMAL LOW (ref >60.00)
Glucose, Bld: 95 mg/dL (ref 70–99)
Potassium: 4.4 mEq/L (ref 3.5–5.1)
Sodium: 140 mEq/L (ref 135–145)
Total Bilirubin: 0.2 mg/dL (ref 0.2–1.2)
Total Protein: 7.1 g/dL (ref 6.0–8.3)

## 2019-06-19 LAB — CBC
HCT: 36.8 % (ref 36.0–46.0)
Hemoglobin: 11.8 g/dL — ABNORMAL LOW (ref 12.0–15.0)
MCHC: 32.2 g/dL (ref 30.0–36.0)
MCV: 87.7 fl (ref 78.0–100.0)
Platelets: 190 10*3/uL (ref 150.0–400.0)
RBC: 4.19 Mil/uL (ref 3.87–5.11)
RDW: 15.7 % — ABNORMAL HIGH (ref 11.5–15.5)
WBC: 6 10*3/uL (ref 4.0–10.5)

## 2019-06-19 LAB — FERRITIN: Ferritin: 41.6 ng/mL (ref 10.0–291.0)

## 2019-06-19 LAB — LIPID PANEL
Cholesterol: 170 mg/dL (ref 0–200)
HDL: 47.1 mg/dL (ref >39.00)
LDL Cholesterol: 103 mg/dL — ABNORMAL HIGH (ref 0–99)
NonHDL: 122.9
Total CHOL/HDL Ratio: 4
Triglycerides: 102 mg/dL (ref 0.0–149.0)
VLDL: 20.4 mg/dL (ref 0.0–40.0)

## 2019-06-19 LAB — HEMOGLOBIN A1C: Hgb A1c MFr Bld: 5.9 % (ref 4.6–6.5)

## 2019-06-19 MED ORDER — LIDOCAINE-PRILOCAINE 2.5-2.5 % EX CREA: TOPICAL_CREAM | CUTANEOUS | 3 refills | Status: DC

## 2019-06-19 MED ORDER — ASPIRIN EC 81 MG PO TBEC: 81.0000 mg | DELAYED_RELEASE_TABLET | Freq: Every day | ORAL | Status: DC

## 2019-06-19 MED ORDER — HYDROCODONE-ACETAMINOPHEN 5-325 MG PO TABS: 1.0000 | ORAL_TABLET | Freq: Four times a day (QID) | ORAL | 0 refills | Status: DC | PRN

## 2019-06-19 NOTE — Assessment & Plan Note (Signed)
Well controlled, no changes to meds. Encouraged heart healthy diet such as the DASH diet and exercise as tolerated.  °

## 2019-06-19 NOTE — Progress Notes (Signed)
Subjective:    Patient ID: Kristi [name], female    DOB: [birth year], 63 y.o.   MRN: [id number]  No chief complaint on file.   HPI Patient is in today for follow up on chronic medical concerns including anemia, hyperlipidemia, hyperglycemia and hypertension. Her left arm. Hand pain is stable and responds adequately to current meds. No recent febrile illness or hospitalizations. Denies CP/palp/SOB/HA/congestion/fevers/GI or GU c/o. Taking meds as prescribed  Past Medical History:  Diagnosis Date  . Anemia    Iron deficinecy anemia  . Anemia 05/14/2017  . Anxiety   . Anxiety and depression 05/15/2014  . Arthritis   . Breast cancer ([place])    left ,last radiation 2'15, last chemo 8'14  . Constipation 11/13/2016  . Depression   . History of chicken pox   . History of radiation therapy 09/09/13-10/28/13   45 gray to left breast, lumpectomy cavity boosted to 63 gray  . HTN (hypertension) 11/13/2016  . Hyperglycemia 01/09/2016  . Hyperlipidemia 05/14/2017  . Hypertension   . MRSA (methicillin resistant Staphylococcus aureus) 2009   right groin area-no issues now. 04-07-14 PCR screen negative today.  . Neuropathy   . Preventative health care 11/13/2016    Past Surgical History:  Procedure Laterality Date  . ANAL SPHINCTEROTOMY  04/2011  . APPENDECTOMY  1980  . AXILLARY LYMPH NODE DISSECTION Left 02/04/2013   Procedure: LEFT AXILLARY LYMPH NODE DISSECTION;  Surgeon: Stark Klein, MD;  Location: [facility];  Service: General;  Laterality: Left;  End: 5997  . BREAST LUMPECTOMY WITH NEEDLE LOCALIZATION Left 02/04/2013   Procedure: LEFT BREAST LUMPECTOMY WITH NEEDLE LOCALIZATION;  Surgeon: Stark Klein, MD;  Location: [facility];  Service: General;  Laterality: Left;  . BREAST SURGERY     Lumpectomy in april 2014  . HEMORRHOID SURGERY  04/2011   ligation  . MASTECTOMY Left 02/15/2017  . PORT-A-CATH REMOVAL N/A 04/16/2014   Procedure: REMOVAL PORT-A-CATH;  Surgeon: Stark Klein, MD;  Location: [facility];  Service:  General;  Laterality: N/A;  . PORTACATH PLACEMENT Right 02/04/2013   Procedure: INSERTION PORT-A-CATH;  Surgeon: Stark Klein, MD;  Location: [facility];  Service: General;  Laterality: Right;  Start Time: 7414.  Marland Kitchen SHOULDER ARTHROSCOPY WITH ROTATOR CUFF REPAIR AND SUBACROMIAL DECOMPRESSION Left 02/24/2014   Procedure: SHOULDER ARTHROSCOPY WITH ROTATOR CUFF REPAIR AND SUBACROMIAL DECOMPRESSION;  Surgeon: Meredith Pel, MD;  Location: [facility];  Service: Orthopedics;  Laterality: Left;  LEFT SHOULDER DIAGNOSTIC OPERATIVE ARTHROSCOPY, SUBACROMIAL DECOMPRESSION, ROTATOR CUFF TEAR REPAIR  . SIMPLE MASTECTOMY WITH AXILLARY SENTINEL NODE BIOPSY Left 02/15/2017   Procedure: LEFT MASTECTOMY;  Surgeon: Stark Klein, MD;  Location: [facility];  Service: General;  Laterality: Left;  . TOTAL MASTECTOMY Right 12/26/2018   Procedure: RIGHT BREAST PROPHYLATIC MASTECTOMY;  Surgeon: Stark Klein, MD;  Location: [facility];  Service: General;  Laterality: Right;    Family History  Problem Relation Age of Onset  . Lung cancer Father   . Hypertension Father   . Thyroid cancer Father        dx in his 10s  . Cancer Father        lung, thyroid, smoker  . Breast cancer Paternal Aunt 17  . Colon cancer Paternal Aunt        dx in her 50x  . Cervical cancer Paternal Aunt        dzx in her 22s  . Ovarian cancer Cousin        dx in her lage 42s  .  Breast cancer Cousin        maternal first cousin, once removed; dx in her late 14s  . Breast cancer Cousin        maternal first cousin once removed; dx in late 81s  . Hypertension Mother   . Diabetes Mother   . Dementia Mother   . Hypertension Brother   . Seizures Brother        Alcohol induced.  . Alcohol abuse Brother        drinker, smoker  . Cancer Paternal Uncle        oral cancer  . Kidney cancer Paternal Grandmother   . Arthritis Daughter        back surgery  . Cancer Cousin        several paternal cousins with brain cancer, leukemia, and other cancers  . Cancer  Sister        stomach    Social History   Socioeconomic History  . Marital status: Married    Spouse name: [name]  . Number of children: 1  . Years of education: 3  . Highest education level: Not on file  Occupational History  . Occupation: INVENTORY Hotel manager: [employer]  Social Needs  . Financial resource strain: Not on file  . Food insecurity    Worry: Not on file    Inability: Not on file  . Transportation needs    Medical: Not on file    Non-medical: Not on file  Tobacco Use  . Smoking status: Never Smoker  . Smokeless tobacco: Never Used  Substance and Sexual Activity  . Alcohol use: No  . Drug use: No  . Sexual activity: Yes    Comment: lives with husband, disability/retirement. [employer], no dietary restrictions  Lifestyle  . Physical activity    Days per week: Not on file    Minutes per session: Not on file  . Stress: Not on file  Relationships  . Social Herbalist on phone: Not on file    Gets together: Not on file    Attends religious service: Not on file    Active member of club or organization: Not on file    Attends meetings of clubs or organizations: Not on file    Relationship status: Not on file  . Intimate partner violence    Fear of current or ex partner: Not on file    Emotionally abused: Not on file    Physically abused: Not on file    Forced sexual activity: Not on file  Other Topics Concern  . Not on file  Social History Narrative   Patient is married [name]) and lives at home with her husband.   Patient has one daughter.   Patient is working at [employer]   Patient has a 12th grade education.   Patient drinks very little caffeine.    Outpatient Medications Prior to Visit  Medication Sig Dispense Refill  . amitriptyline (ELAVIL) 100 MG tablet Take 100 mg by mouth at bedtime.  2  . anastrozole (ARIMIDEX) 1 MG tablet Take 1 tablet (1 mg total) by mouth daily. TAKE 1 TABLET (1 MG) BY MOUTH DAILY  IN THE MORNING 90 tablet 3  . B Complex-C (SUPER B COMPLEX PO) Take 1 tablet by mouth daily.    . clonazePAM (KLONOPIN) 1 MG tablet TAKE 1 TABLET BY MOUTH EVERY MORNING AND 1/2 TABLET AT BEDTIME (Patient taking differently: Take 0.5-1 mg  by mouth See admin instructions. Take 1 mg by mouth in the morning and 0.5 mg at bedtime) 45 tablet 1  . gabapentin (NEURONTIN) 800 MG tablet TAKE 1 TABLET BY MOUTH THREE TIMES A DAY 270 tablet 1  . hydrOXYzine (VISTARIL) 25 MG capsule Take 25 mg by mouth 3 (three) times daily.   1  . KLOR-CON M20 20 MEQ tablet TAKE 1 TABLET BY MOUTH EVERY DAY 90 tablet 1  . LATUDA 80 MG TABS tablet Take 80 mg by mouth at bedtime.   1  . methocarbamol (ROBAXIN) 500 MG tablet Take 1 tablet (500 mg total) by mouth every 6 (six) hours as needed for muscle spasms. 20 tablet 1  . Multiple Vitamin (MULTIVITAMIN WITH MINERALS) TABS tablet Take 1 tablet by mouth daily.    . valsartan-hydrochlorothiazide (DIOVAN-HCT) 320-25 MG tablet TAKE 1 TABLET BY MOUTH EVERY DAY IN THE MORNING 90 tablet 1  . aspirin EC 81 MG tablet Take 81 mg by mouth daily at 3 pm.     . HYDROcodone-acetaminophen (NORCO/VICODIN) 5-325 MG tablet Take 1-2 tablets by mouth every 6 (six) hours as needed. For neuropathy/pain. 30 tablet 0  . lidocaine-prilocaine (EMLA) cream APPLY EXTERNALLY TO THE AFFECTED AREA TWICE DAILY AS DIRECTED 30 g 0   No facility-administered medications prior to visit.     No Known Allergies  Review of Systems  Constitutional: Negative for fever and malaise/fatigue.  HENT: Negative for congestion.   Eyes: Negative for blurred vision.  Respiratory: Negative for shortness of breath.   Cardiovascular: Negative for chest pain, palpitations and leg swelling.  Gastrointestinal: Negative for abdominal pain, blood in stool and nausea.  Genitourinary: Negative for dysuria and frequency.  Musculoskeletal: Negative for falls.  Skin: Negative for rash.  Neurological: Negative for dizziness, loss of  consciousness and headaches.  Endo/Heme/Allergies: Negative for environmental allergies.  Psychiatric/Behavioral: Negative for depression. The patient is not nervous/anxious.        Objective:    Physical Exam Vitals signs and nursing note reviewed.  Constitutional:      General: She is not in acute distress.    Appearance: She is well-developed.  HENT:     Head: Normocephalic and atraumatic.     Nose: Nose normal.  Eyes:     General:        Right eye: No discharge.        Left eye: No discharge.  Neck:     Musculoskeletal: Normal range of motion and neck supple.  Cardiovascular:     Rate and Rhythm: Normal rate and regular rhythm.     Heart sounds: No murmur.  Pulmonary:     Effort: Pulmonary effort is normal.     Breath sounds: Normal breath sounds.  Abdominal:     General: Bowel sounds are normal.     Palpations: Abdomen is soft.     Tenderness: There is no abdominal tenderness.  Skin:    General: Skin is warm and dry.  Neurological:     Mental Status: She is alert and oriented to person, place, and time.     BP 120/78 (BP Location: Left Arm, Patient Position: Sitting, Cuff Size: Normal)   Pulse 97   Temp (!) 96.8 F (36 C) (Oral)   Resp 18   Wt 176 lb (79.8 kg)   LMP 01/20/2013   SpO2 96%   BMI 32.19 kg/m  Wt Readings from Last 3 Encounters:  06/19/19 176 lb (79.8 kg)  05/22/19 174 lb 1.6 oz (  79 kg)  12/26/18 184 lb (83.5 kg)    Diabetic Foot Exam - Simple   No data filed     Lab Results  Component Value Date   WBC 7.4 12/28/2018   HGB 8.0 (L) 12/28/2018   HCT 25.2 (L) 12/28/2018   PLT 126 (L) 12/28/2018   GLUCOSE 137 (H) 12/28/2018   CHOL 183 06/18/2018   TRIG 106.0 06/18/2018   HDL 49.80 06/18/2018   LDLCALC 112 (H) 06/18/2018   ALT 17 06/18/2018   AST 19 06/18/2018   NA 138 12/28/2018   K 3.5 12/28/2018   CL 106 12/28/2018   CREATININE 1.21 (H) 12/28/2018   BUN 14 12/28/2018   CO2 27 12/28/2018   TSH 2.14 06/18/2018   INR 1.02  07/19/2013   HGBA1C 6.0 06/18/2018    Lab Results  Component Value Date   TSH 2.14 06/18/2018   Lab Results  Component Value Date   WBC 7.4 12/28/2018   HGB 8.0 (L) 12/28/2018   HCT 25.2 (L) 12/28/2018   MCV 86.9 12/28/2018   PLT 126 (L) 12/28/2018   Lab Results  Component Value Date   NA 138 12/28/2018   K 3.5 12/28/2018   CHLORIDE 104 01/22/2017   CO2 27 12/28/2018   GLUCOSE 137 (H) 12/28/2018   BUN 14 12/28/2018   CREATININE 1.21 (H) 12/28/2018   BILITOT 0.3 06/18/2018   ALKPHOS 82 06/18/2018   AST 19 06/18/2018   ALT 17 06/18/2018   PROT 7.3 06/18/2018   ALBUMIN 4.2 06/18/2018   CALCIUM 8.2 (L) 12/28/2018   ANIONGAP 5 12/28/2018   EGFR 67 (L) 01/22/2017   GFR 57.18 (L) 06/18/2018   Lab Results  Component Value Date   CHOL 183 06/18/2018   Lab Results  Component Value Date   HDL 49.80 06/18/2018   Lab Results  Component Value Date   LDLCALC 112 (H) 06/18/2018   Lab Results  Component Value Date   TRIG 106.0 06/18/2018   Lab Results  Component Value Date   CHOLHDL 4 06/18/2018   Lab Results  Component Value Date   HGBA1C 6.0 06/18/2018       Assessment & Plan:   Problem List Items Addressed This Visit    Primary cancer of upper inner quadrant of left female breast ([place])    Continues to follow with Dr [name].       Relevant Medications   aspirin EC 81 MG tablet   Neuropathy of hand, left    Hydrocodone prn sparingly has been helpful      Relevant Medications   HYDROcodone-acetaminophen (NORCO/VICODIN) 5-325 MG tablet   Hyperglycemia - Primary    hgba1c acceptable, minimize simple carbs. Increase exercise as tolerated.      Relevant Orders   Hemoglobin A1c   HTN (hypertension)    Well controlled, no changes to meds. Encouraged heart healthy diet such as the DASH diet and exercise as tolerated.       Relevant Medications   aspirin EC 81 MG tablet   Other Relevant Orders   CBC   Comprehensive metabolic panel   TSH   Anemia     Repeat cbc and check Ifob and ferritin. Patient asymptomatic      Hyperlipidemia    Encouraged heart healthy diet, increase exercise, avoid trans fats, consider a krill oil cap daily      Relevant Medications   aspirin EC 81 MG tablet   Other Relevant Orders   Lipid panel  Other Visit Diagnoses    Other polyneuropathy       Relevant Medications   HYDROcodone-acetaminophen (NORCO/VICODIN) 5-325 MG tablet      I have changed [name] "Theresa"'s aspirin EC. I am also having her maintain her B Complex-C (SUPER B COMPLEX PO), amitriptyline, multivitamin with minerals, clonazePAM, Latuda, hydrOXYzine, methocarbamol, gabapentin, valsartan-hydrochlorothiazide, [place]-Con M20, anastrozole, lidocaine-prilocaine, and HYDROcodone-acetaminophen.  Meds ordered this encounter  Medications  . lidocaine-prilocaine (EMLA) cream    Sig: APPLY EXTERNALLY TO THE AFFECTED AREA TWICE DAILY AS DIRECTED    Dispense:  30 g    Refill:  3  . HYDROcodone-acetaminophen (NORCO/VICODIN) 5-325 MG tablet    Sig: Take 1-2 tablets by mouth every 6 (six) hours as needed. For neuropathy/pain.    Dispense:  30 tablet    Refill:  0  . aspirin EC 81 MG tablet    Sig: Take 1 tablet (81 mg total) by mouth daily.     [name], MD

## 2019-06-19 NOTE — Patient Instructions (Addendum)
Pulse oximeter  Check vitals weekly  Multivitamin with minerals with zinc and vitamin C   Consider Benefiber with miralax Peripheral Neuropathy Peripheral neuropathy is a type of nerve damage. It affects nerves that carry signals between the spinal cord and the arms, legs, and the rest of the body (peripheral nerves). It does not affect nerves in the spinal cord or brain. In peripheral neuropathy, one nerve or a group of nerves may be damaged. Peripheral neuropathy is a broad category that includes many specific nerve disorders, like diabetic neuropathy, hereditary neuropathy, and carpal tunnel syndrome. What are the causes? This condition may be caused by:  Diabetes. This is the most common cause of peripheral neuropathy.  Nerve injury.  Pressure or stress on a nerve that lasts a long time.  Lack (deficiency) of B vitamins. This can result from alcoholism, poor diet, or a restricted diet.  Infections.  Autoimmune diseases, such as rheumatoid arthritis and systemic lupus erythematosus.  Nerve diseases that are passed from parent to child (inherited).  Some medicines, such as cancer medicines (chemotherapy).  Poisonous (toxic) substances, such as lead and mercury.  Too little blood flowing to the legs.  Kidney disease.  Thyroid disease. In some cases, the cause of this condition is not known. What are the signs or symptoms? Symptoms of this condition depend on which of your nerves is damaged. Common symptoms include:  Loss of feeling (numbness) in the feet, hands, or both.  Tingling in the feet, hands, or both.  Burning pain.  Very sensitive skin.  Weakness.  Not being able to move a part of the body (paralysis).  Muscle twitching.  Clumsiness or poor coordination.  Loss of balance.  Not being able to control your bladder.  Feeling dizzy.  Sexual problems. How is this diagnosed? Diagnosing and finding the cause of peripheral neuropathy can be difficult.  Your health care provider will take your medical history and do a physical exam. A neurological exam will also be done. This involves checking things that are affected by your brain, spinal cord, and nerves (nervous system). For example, your health care provider will check your reflexes, how you move, and what you can feel. You may have other tests, such as:  Blood tests.  Electromyogram (EMG) and nerve conduction tests. These tests check nerve function and how well the nerves are controlling the muscles.  Imaging tests, such as CT scans or MRI to rule out other causes of your symptoms.  Removing a small piece of nerve to be examined in a lab (nerve biopsy). This is rare.  Removing and examining a small amount of the fluid that surrounds the brain and spinal cord (lumbar puncture). This is rare. How is this treated? Treatment for this condition may involve:  Treating the underlying cause of the neuropathy, such as diabetes, kidney disease, or vitamin deficiencies.  Stopping medicines that can cause neuropathy, such as chemotherapy.  Medicine to relieve pain. Medicines may include: ? Prescription or over-the-counter pain medicine. ? Antiseizure medicine. ? Antidepressants. ? Pain-relieving patches that are applied to painful areas of skin.  Surgery to relieve pressure on a nerve or to destroy a nerve that is causing pain.  Physical therapy to help improve movement and balance.  Devices to help you move around (assistive devices). Follow these instructions at home: Medicines  Take over-the-counter and prescription medicines only as told by your health care provider. Do not take any other medicines without first asking your health care provider.  Do not  drive or use heavy machinery while taking prescription pain medicine. Lifestyle   Do not use any products that contain nicotine or tobacco, such as cigarettes and e-cigarettes. Smoking keeps blood from reaching damaged nerves. If  you need help quitting, ask your health care provider.  Avoid or limit alcohol. Too much alcohol can cause a vitamin B deficiency, and vitamin B is needed for healthy nerves.  Eat a healthy diet. This includes: ? Eating foods that are high in fiber, such as fresh fruits and vegetables, whole grains, and beans. ? Limiting foods that are high in fat and processed sugars, such as fried or sweet foods. General instructions   If you have diabetes, work closely with your health care provider to keep your blood sugar under control.  If you have numbness in your feet: ? Check every day for signs of injury or infection. Watch for redness, warmth, and swelling. ? Wear padded socks and comfortable shoes. These help protect your feet.  Develop a good support system. Living with peripheral neuropathy can be stressful. Consider talking with a mental health specialist or joining a support group.  Use assistive devices and attend physical therapy as told by your health care provider. This may include using a walker or a cane.  Keep all follow-up visits as told by your health care provider. This is important. Contact a health care provider if:  You have new signs or symptoms of peripheral neuropathy.  You are struggling emotionally from dealing with peripheral neuropathy.  Your pain is not well-controlled. Get help right away if:  You have an injury or infection that is not healing normally.  You develop new weakness in an arm or leg.  You fall frequently. Summary  Peripheral neuropathy is when the nerves in the arms, or legs are damaged, resulting in numbness, weakness, or pain.  There are many causes of peripheral neuropathy, including diabetes, pinched nerves, vitamin deficiencies, autoimmune disease, and hereditary conditions.  Diagnosing and finding the cause of peripheral neuropathy can be difficult. Your health care provider will take your medical history, do a physical exam, and do  tests, including blood tests and nerve function tests.  Treatment involves treating the underlying cause of the neuropathy and taking medicines to help control pain. Physical therapy and assistive devices may also help. This information is not intended to replace advice given to you by your health care provider. Make sure you discuss any questions you have with your health care provider. Document Released: 10/13/2002 Document Revised: 10/05/2017 Document Reviewed: 01/01/2017 Elsevier Patient Education  2020 [employer].

## 2019-06-19 NOTE — Assessment & Plan Note (Signed)
Hydrocodone prn sparingly has been helpful

## 2019-06-19 NOTE — Assessment & Plan Note (Signed)
Repeat cbc and check Ifob and ferritin. Patient asymptomatic

## 2019-06-19 NOTE — Assessment & Plan Note (Signed)
Encouraged heart healthy diet, increase exercise, avoid trans fats, consider a krill oil cap daily 

## 2019-06-19 NOTE — Assessment & Plan Note (Signed)
hgba1c acceptable, minimize simple carbs. Increase exercise as tolerated.  

## 2019-06-19 NOTE — Assessment & Plan Note (Addendum)
Continues to follow with Dr [name].

## 2019-06-24 ENCOUNTER — Other Ambulatory Visit: Payer: Self-pay | Admitting: *Deleted

## 2019-06-24 ENCOUNTER — Other Ambulatory Visit (INDEPENDENT_AMBULATORY_CARE_PROVIDER_SITE_OTHER): Payer: Medicare Other

## 2019-06-24 DIAGNOSIS — D649 Anemia, unspecified: Secondary | ICD-10-CM

## 2019-06-24 LAB — FECAL OCCULT BLOOD, IMMUNOCHEMICAL: Fecal Occult Bld: NEGATIVE

## 2019-06-24 NOTE — Progress Notes (Signed)
ifob

## 2019-07-09 ENCOUNTER — Telehealth: Payer: Self-pay | Admitting: Family Medicine

## 2019-07-09 NOTE — Telephone Encounter (Signed)
Copied from [place] [id number]. Topic: General - Other >> Jul 09, 2019  1:02 PM Keene Breath wrote: Reason for CRM: Patient called to ask that the nurse or doctor call her regarding her disability forms.  CB# [phone]

## 2019-07-15 ENCOUNTER — Other Ambulatory Visit: Payer: Self-pay

## 2019-07-15 ENCOUNTER — Ambulatory Visit (INDEPENDENT_AMBULATORY_CARE_PROVIDER_SITE_OTHER): Payer: Medicare Other | Admitting: Family Medicine

## 2019-07-15 ENCOUNTER — Encounter: Payer: Self-pay | Admitting: Family Medicine

## 2019-07-15 ENCOUNTER — Ambulatory Visit (HOSPITAL_BASED_OUTPATIENT_CLINIC_OR_DEPARTMENT_OTHER)
Admission: RE | Admit: 2019-07-15 | Discharge: 2019-07-15 | Disposition: A | Discharge status: Home / Self Care | Payer: Medicare Other | Source: Ambulatory Visit | Attending: Family Medicine | Admitting: Family Medicine

## 2019-07-15 VITALS — BP 122/82 | HR 53 | Temp 97.1°F | Resp 18 | Wt 177.0 lb

## 2019-07-15 DIAGNOSIS — R739 Hyperglycemia, unspecified: Secondary | ICD-10-CM

## 2019-07-15 DIAGNOSIS — M1712 Unilateral primary osteoarthritis, left knee: Secondary | ICD-10-CM | POA: Diagnosis not present

## 2019-07-15 DIAGNOSIS — M25561 Pain in right knee: Secondary | ICD-10-CM | POA: Diagnosis not present

## 2019-07-15 DIAGNOSIS — M25562 Pain in left knee: Secondary | ICD-10-CM

## 2019-07-15 DIAGNOSIS — M1711 Unilateral primary osteoarthritis, right knee: Secondary | ICD-10-CM | POA: Diagnosis not present

## 2019-07-15 DIAGNOSIS — I10 Essential (primary) hypertension: Secondary | ICD-10-CM | POA: Diagnosis not present

## 2019-07-15 MED ORDER — MELOXICAM 7.5 MG PO TABS: 7.5000 mg | ORAL_TABLET | Freq: Every day | ORAL | 1 refills | Status: DC | PRN

## 2019-07-15 NOTE — Assessment & Plan Note (Signed)
[place] about 2 weeks ago and she began having pain upon returning home began having severe enough pain in her left knee to keep her up at night and to make it difficult to ambulate. Stairs are very painful. As she compensated by using the right knee more it has begun to hurt as well. No redness, warmth or obvious swelling. She has tried numerous over the counter topical treatments without relief. Given Meloxicam 7.5 mg to use daily x 1 week then as needed and encouraged to use ice and topical lidocaine gel. Sent for xrays of knees and referred  To ortho for consideration given the involvement of both knees and severity

## 2019-07-15 NOTE — Assessment & Plan Note (Signed)
Well controlled, no changes to meds. Encouraged heart healthy diet such as the DASH diet and exercise as tolerated.  °

## 2019-07-15 NOTE — Patient Instructions (Signed)
Acute Knee Pain, Adult Acute knee pain is sudden and may be caused by damage, swelling, or irritation of the muscles and tissues that support your knee. The injury may result from:  A fall.  An injury to your knee from twisting motions.  A hit to the knee.  Infection. Acute knee pain may go away on its own with time and rest. If it does not, your health care provider may order tests to find the cause of the pain. These may include:  Imaging tests, such as an X-ray, MRI, or ultrasound.  Joint aspiration. In this test, fluid is removed from the knee.  Arthroscopy. In this test, a lighted tube is inserted into the knee and an image is projected onto a TV screen.  Biopsy. In this test, a sample of tissue is removed from the body and studied under a microscope. Follow these instructions at home: Pay attention to any changes in your symptoms. Take these actions to relieve your pain. If you have a knee sleeve or brace:   Wear the sleeve or brace as told by your health care provider. Remove it only as told by your health care provider.  Loosen the sleeve or brace if your toes tingle, become numb, or turn cold and blue.  Keep the sleeve or brace clean.  If the sleeve or brace is not waterproof: ? Do not let it get wet. ? Cover it with a watertight covering when you take a bath or shower. Activity  Rest your knee.  Do not do things that cause pain or make pain worse.  Avoid high-impact activities or exercises, such as running, jumping rope, or doing jumping jacks.  Work with a physical therapist to make a safe exercise program, as recommended by your health care provider. Do exercises as told by your physical therapist. Managing pain, stiffness, and swelling   If directed, put ice on the knee: ? Put ice in a plastic bag. ? Place a towel between your skin and the bag. ? Leave the ice on for 20 minutes, 2-3 times a day.  If directed, use an elastic bandage to put pressure  (compression) on your injured knee. This may control swelling, give support, and help with discomfort. General instructions  Take over-the-counter and prescription medicines only as told by your health care provider.  Raise (elevate) your knee above the level of your heart when you are sitting or lying down.  Sleep with a pillow under your knee.  Do not use any products that contain nicotine or tobacco, such as cigarettes, e-cigarettes, and chewing tobacco. These can delay healing. If you need help quitting, ask your health care provider.  If you are overweight, work with your health care provider and a dietitian to set a weight-loss goal that is healthy and reasonable for you. Extra weight can put pressure on your knee.  Keep all follow-up visits as told by your health care provider. This is important. Contact a health care provider if:  Your knee pain continues, changes, or gets worse.  You have a fever along with knee pain.  Your knee feels warm to the touch.  Your knee buckles or locks up. Get help right away if:  Your knee swells, and the swelling becomes worse.  You cannot move your knee.  You have severe pain in your knee. Summary  Acute knee pain can be caused by a fall, an injury, an infection, or damage, swelling, or irritation of the tissues that support your knee.    Your health care provider may perform tests to find out the cause of the pain. °· Pay attention to any changes in your symptoms. Relieve your pain with rest, medicines, light activity, and use of ice. °· Get help if your pain continues or becomes worse, your knee swells, or you cannot move your knee. °This information is not intended to replace advice given to you by your health care provider. Make sure you discuss any questions you have with your health care provider. °Document Released: 08/20/2007 Document Revised: 04/04/2018 Document Reviewed: 04/04/2018 °Elsevier Patient Education © 2020 [employer] ° °

## 2019-07-15 NOTE — Assessment & Plan Note (Addendum)
hgba1c acceptable, minimize simple carbs. Increase exercise as tolerated. Given flu shot today 

## 2019-07-15 NOTE — Telephone Encounter (Signed)
Do not have forms as of yet

## 2019-07-15 NOTE — Progress Notes (Signed)
Subjective:    Patient ID: Kristi [name], female    DOB: 09/13/1956, 63 y.o.   MRN: [id number]  No chief complaint on file.   HPI Patient is in today for evaluation of bilateral knee pain. She was at a cabin with a great many steps about 2 weeks ago and she began having pain upon returning home began having severe enough pain in her left knee to keep her up at night and to make it difficult to ambulate. Stairs are very painful. As she compensated by using the right knee more it has begun to hurt as well. No redness, warmth or obvious swelling. She otherwise feels well. No recent febrile illness or hospitalizations. No c/o polyuria or polydipsia. Denies CP/palp/SOB/HA/congestion/fevers/GI or GU c/o. Taking meds as prescribed  Past Medical History:  Diagnosis Date  . Anemia    Iron deficinecy anemia  . Anemia 05/14/2017  . Anxiety   . Anxiety and depression 05/15/2014  . Arthritis   . Breast cancer ([place])    left ,last radiation 2'15, last chemo 8'14  . Constipation 11/13/2016  . Depression   . History of chicken pox   . History of radiation therapy 09/09/13-10/28/13   45 gray to left breast, lumpectomy cavity boosted to 63 gray  . HTN (hypertension) 11/13/2016  . Hyperglycemia 01/09/2016  . Hyperlipidemia 05/14/2017  . Hypertension   . MRSA (methicillin resistant Staphylococcus aureus) 2009   right groin area-no issues now. 04-07-14 PCR screen negative today.  . Neuropathy   . Preventative health care 11/13/2016    Past Surgical History:  Procedure Laterality Date  . ANAL SPHINCTEROTOMY  04/2011  . APPENDECTOMY  1980  . AXILLARY LYMPH NODE DISSECTION Left 02/04/2013   Procedure: LEFT AXILLARY LYMPH NODE DISSECTION;  Surgeon: Stark Klein, MD;  Location: [facility];  Service: General;  Laterality: Left;  End: 7793  . BREAST LUMPECTOMY WITH NEEDLE LOCALIZATION Left 02/04/2013   Procedure: LEFT BREAST LUMPECTOMY WITH NEEDLE LOCALIZATION;  Surgeon: Stark Klein, MD;  Location: [facility];  Service: General;   Laterality: Left;  . BREAST SURGERY     Lumpectomy in april 2014  . HEMORRHOID SURGERY  04/2011   ligation  . MASTECTOMY Left 02/15/2017  . PORT-A-CATH REMOVAL N/A 04/16/2014   Procedure: REMOVAL PORT-A-CATH;  Surgeon: Stark Klein, MD;  Location[facility];  Service: General;  Laterality: N/A;  . PORTACATH PLACEMENT Right 02/04/2013   Procedure: INSERTION PORT-A-CATH;  Surgeon: Stark Klein, MD;  Location: [facility];  Service: General;  Laterality: Right;  Start Time: 9030.  Marland Kitchen SHOULDER ARTHROSCOPY WITH ROTATOR CUFF REPAIR AND SUBACROMIAL DECOMPRESSION Left 02/24/2014   Procedure: SHOULDER ARTHROSCOPY WITH ROTATOR CUFF REPAIR AND SUBACROMIAL DECOMPRESSION;  Surgeon: Meredith Pel, MD;  Location: [facility];  Service: Orthopedics;  Laterality: Left;  LEFT SHOULDER DIAGNOSTIC OPERATIVE ARTHROSCOPY, SUBACROMIAL DECOMPRESSION, ROTATOR CUFF TEAR REPAIR  . SIMPLE MASTECTOMY WITH AXILLARY SENTINEL NODE BIOPSY Left 02/15/2017   Procedure: LEFT MASTECTOMY;  Surgeon: Stark Klein, MD;  Location: [facility];  Service: General;  Laterality: Left;  . TOTAL MASTECTOMY Right 12/26/2018   Procedure: RIGHT BREAST PROPHYLATIC MASTECTOMY;  Surgeon: Stark Klein, MD;  Location: [facility];  Service: General;  Laterality: Right;    Family History  Problem Relation Age of Onset  . Lung cancer Father   . Hypertension Father   . Thyroid cancer Father        dx in his 77s  . Cancer Father        lung, thyroid,  smoker  . Breast cancer Paternal Aunt 22  . Colon cancer Paternal Aunt        dx in her 50x  . Cervical cancer Paternal Aunt        dzx in her 48s  . Ovarian cancer Cousin        dx in her lage 34s  . Breast cancer Cousin        maternal first cousin, once removed; dx in her late 88s  . Breast cancer Cousin        maternal first cousin once removed; dx in late 77s  . Hypertension Mother   . Diabetes Mother   . Dementia Mother   . Hypertension Brother   . Seizures Brother        Alcohol induced.  . Alcohol abuse  Brother        drinker, smoker  . Cancer Paternal Uncle        oral cancer  . Kidney cancer Paternal Grandmother   . Arthritis Daughter        back surgery  . Cancer Cousin        several paternal cousins with brain cancer, leukemia, and other cancers  . Cancer Sister        stomach    Social History   Socioeconomic History  . Marital status: Married    Spouse name: Kristi [name]  . Number of children: 1  . Years of education: 40  . Highest education level: Not on file  Occupational History  . Occupation: INVENTORY Hotel manager: [employer]  Social Needs  . Financial resource strain: Not on file  . Food insecurity    Worry: Not on file    Inability: Not on file  . Transportation needs    Medical: Not on file    Non-medical: Not on file  Tobacco Use  . Smoking status: Never Smoker  . Smokeless tobacco: Never Used  Substance and Sexual Activity  . Alcohol use: No  . Drug use: No  . Sexual activity: Yes    Comment: lives with husband, disability/retirement. [employer], no dietary restrictions  Lifestyle  . Physical activity    Days per week: Not on file    Minutes per session: Not on file  . Stress: Not on file  Relationships  . Social Herbalist on phone: Not on file    Gets together: Not on file    Attends religious service: Not on file    Active member of club or organization: Not on file    Attends meetings of clubs or organizations: Not on file    Relationship status: Not on file  . Intimate partner violence    Fear of current or ex partner: Not on file    Emotionally abused: Not on file    Physically abused: Not on file    Forced sexual activity: Not on file  Other Topics Concern  . Not on file  Social History Narrative   Patient is married Kristi [name]) and lives at home with her husband.   Patient has one daughter.   Patient is working at [employer]   Patient has a 12th grade education.   Patient drinks very little caffeine.     Outpatient Medications Prior to Visit  Medication Sig Dispense Refill  . amitriptyline (ELAVIL) 100 MG tablet Take 100 mg by mouth at bedtime.  2  . anastrozole (ARIMIDEX) 1 MG tablet Take 1 tablet (  1 mg total) by mouth daily. TAKE 1 TABLET (1 MG) BY MOUTH DAILY IN THE MORNING 90 tablet 3  . aspirin EC 81 MG tablet Take 1 tablet (81 mg total) by mouth daily.    . B Complex-C (SUPER B COMPLEX PO) Take 1 tablet by mouth daily.    . clonazePAM (KLONOPIN) 1 MG tablet TAKE 1 TABLET BY MOUTH EVERY MORNING AND 1/2 TABLET AT BEDTIME (Patient taking differently: Take 0.5-1 mg by mouth See admin instructions. Take 1 mg by mouth in the morning and 0.5 mg at bedtime) 45 tablet 1  . gabapentin (NEURONTIN) 800 MG tablet TAKE 1 TABLET BY MOUTH THREE TIMES A DAY 270 tablet 1  . HYDROcodone-acetaminophen (NORCO/VICODIN) 5-325 MG tablet Take 1-2 tablets by mouth every 6 (six) hours as needed. For neuropathy/pain. 30 tablet 0  . hydrOXYzine (VISTARIL) 25 MG capsule Take 25 mg by mouth 3 (three) times daily.   1  . KLOR-CON M20 20 MEQ tablet TAKE 1 TABLET BY MOUTH EVERY DAY 90 tablet 1  . LATUDA 80 MG TABS tablet Take 80 mg by mouth at bedtime.   1  . lidocaine-prilocaine (EMLA) cream APPLY EXTERNALLY TO THE AFFECTED AREA TWICE DAILY AS DIRECTED 30 g 3  . methocarbamol (ROBAXIN) 500 MG tablet Take 1 tablet (500 mg total) by mouth every 6 (six) hours as needed for muscle spasms. 20 tablet 1  . Multiple Vitamin (MULTIVITAMIN WITH MINERALS) TABS tablet Take 1 tablet by mouth daily.    . valsartan-hydrochlorothiazide (DIOVAN-HCT) 320-25 MG tablet TAKE 1 TABLET BY MOUTH EVERY DAY IN THE MORNING 90 tablet 1   No facility-administered medications prior to visit.     No Known Allergies  Review of Systems  Constitutional: Negative for chills, fever and malaise/fatigue.  HENT: Negative for congestion and hearing loss.   Eyes: Negative for discharge.  Respiratory: Negative for cough, sputum production and shortness of  breath.   Cardiovascular: Negative for chest pain, palpitations and leg swelling.  Gastrointestinal: Negative for abdominal pain, blood in stool, constipation, diarrhea, heartburn, nausea and vomiting.  Genitourinary: Negative for dysuria, frequency, hematuria and urgency.  Musculoskeletal: Positive for joint pain. Negative for back pain, falls and myalgias.  Skin: Negative for rash.  Neurological: Negative for dizziness, sensory change, loss of consciousness, weakness and headaches.  Endo/Heme/Allergies: Negative for environmental allergies. Does not bruise/bleed easily.  Psychiatric/Behavioral: Negative for depression and suicidal ideas. The patient is not nervous/anxious and does not have insomnia.        Objective:    Physical Exam Vitals signs and nursing note reviewed.  Constitutional:      General: She is not in acute distress.    Appearance: She is well-developed.  HENT:     Head: Normocephalic and atraumatic.     Nose: Nose normal.  Eyes:     General:        Right eye: No discharge.        Left eye: No discharge.  Neck:     Musculoskeletal: Normal range of motion and neck supple.  Cardiovascular:     Rate and Rhythm: Normal rate and regular rhythm.     Heart sounds: No murmur.  Pulmonary:     Effort: Pulmonary effort is normal.     Breath sounds: Normal breath sounds.  Abdominal:     General: Bowel sounds are normal.     Palpations: Abdomen is soft.     Tenderness: There is no abdominal tenderness.  Musculoskeletal:  General: Tenderness present. No swelling or deformity.     Right lower leg: No edema.     Left lower leg: No edema.     Comments: Left knee pain noted with internal rotation of knee. No warmth or redness. No obvious pain with palpation or movement of right knee  Skin:    General: Skin is warm and dry.  Neurological:     Mental Status: She is alert and oriented to person, place, and time.     BP 122/82 (BP Location: Left Arm, Patient  Position: Sitting, Cuff Size: Normal)   Pulse (!) 53   Temp (!) 97.1 F (36.2 C) (Temporal)   Resp 18   Wt 177 lb (80.3 kg)   LMP 01/20/2013   SpO2 96%   BMI 32.37 kg/m  Wt Readings from Last 3 Encounters:  07/15/19 177 lb (80.3 kg)  06/19/19 176 lb (79.8 kg)  05/22/19 174 lb 1.6 oz (79 kg)    Diabetic Foot Exam - Simple   No data filed     Lab Results  Component Value Date   WBC 6.0 06/19/2019   HGB 11.8 (L) 06/19/2019   HCT 36.8 06/19/2019   PLT 190.0 06/19/2019   GLUCOSE 95 06/19/2019   CHOL 170 06/19/2019   TRIG 102.0 06/19/2019   HDL 47.10 06/19/2019   LDLCALC 103 (H) 06/19/2019   ALT 19 06/19/2019   AST 20 06/19/2019   NA 140 06/19/2019   K 4.4 06/19/2019   CL 102 06/19/2019   CREATININE 1.15 06/19/2019   BUN 15 06/19/2019   CO2 30 06/19/2019   TSH 1.29 06/19/2019   INR 1.02 07/19/2013   HGBA1C 5.9 06/19/2019    Lab Results  Component Value Date   TSH 1.29 06/19/2019   Lab Results  Component Value Date   WBC 6.0 06/19/2019   HGB 11.8 (L) 06/19/2019   HCT 36.8 06/19/2019   MCV 87.7 06/19/2019   PLT 190.0 06/19/2019   Lab Results  Component Value Date   NA 140 06/19/2019   K 4.4 06/19/2019   CHLORIDE 104 01/22/2017   CO2 30 06/19/2019   GLUCOSE 95 06/19/2019   BUN 15 06/19/2019   CREATININE 1.15 06/19/2019   BILITOT 0.2 06/19/2019   ALKPHOS 89 06/19/2019   AST 20 06/19/2019   ALT 19 06/19/2019   PROT 7.1 06/19/2019   ALBUMIN 4.4 06/19/2019   CALCIUM 10.2 06/19/2019   ANIONGAP 5 12/28/2018   EGFR 67 (L) 01/22/2017   GFR 57.41 (L) 06/19/2019   Lab Results  Component Value Date   CHOL 170 06/19/2019   Lab Results  Component Value Date   HDL 47.10 06/19/2019   Lab Results  Component Value Date   LDLCALC 103 (H) 06/19/2019   Lab Results  Component Value Date   TRIG 102.0 06/19/2019   Lab Results  Component Value Date   CHOLHDL 4 06/19/2019   Lab Results  Component Value Date   HGBA1C 5.9 06/19/2019       Assessment &  Plan:   Problem List Items Addressed This Visit    Hyperglycemia    hgba1c acceptable, minimize simple carbs. Increase exercise as tolerated. Given flu shot today      HTN (hypertension)    Well controlled, no changes to meds. Encouraged heart healthy diet such as the DASH diet and exercise as tolerated.       Knee pain, bilateral - Primary    She was at a cabin with a great  many steps about 2 weeks ago and she began having pain upon returning home began having severe enough pain in her left knee to keep her up at night and to make it difficult to ambulate. Stairs are very painful. As she compensated by using the right knee more it has begun to hurt as well. No redness, warmth or obvious swelling. She has tried numerous over the counter topical treatments without relief. Given Meloxicam 7.5 mg to use daily x 1 week then as needed and encouraged to use ice and topical lidocaine gel. Sent for xrays of knees and referred  To ortho for consideration given the involvement of both knees and severity      Relevant Orders   DG Knee Complete 4 Views Left   DG Knee Complete 4 Views Right   Ambulatory referral to Orthopedic Surgery      I am having Kristi Mage "Clarene Critchley" start on meloxicam. I am also having her maintain her B Complex-C (SUPER B COMPLEX PO), amitriptyline, multivitamin with minerals, clonazePAM, Latuda, hydrOXYzine, methocarbamol, gabapentin, valsartan-hydrochlorothiazide, Klor-Con M20, anastrozole, lidocaine-prilocaine, HYDROcodone-acetaminophen, and aspirin EC.  Meds ordered this encounter  Medications  . meloxicam (MOBIC) 7.5 MG tablet    Sig: Take 1 tablet (7.5 mg total) by mouth daily as needed for pain.    Dispense:  30 tablet    Refill:  1     [name], MD

## 2019-07-16 ENCOUNTER — Encounter: Payer: Self-pay | Admitting: Family Medicine

## 2019-07-22 ENCOUNTER — Encounter: Payer: Self-pay | Admitting: Orthopaedic Surgery

## 2019-07-22 ENCOUNTER — Ambulatory Visit (INDEPENDENT_AMBULATORY_CARE_PROVIDER_SITE_OTHER): Payer: Medicare Other | Admitting: Orthopaedic Surgery

## 2019-07-22 DIAGNOSIS — M1712 Unilateral primary osteoarthritis, left knee: Secondary | ICD-10-CM

## 2019-07-22 DIAGNOSIS — M1711 Unilateral primary osteoarthritis, right knee: Secondary | ICD-10-CM | POA: Diagnosis not present

## 2019-07-22 MED ORDER — LIDOCAINE HCL 1 % IJ SOLN
2.0000 mL | INTRAMUSCULAR | Status: AC | PRN
  Administered 2019-07-22: 2 mL

## 2019-07-22 MED ORDER — METHYLPREDNISOLONE ACETATE 40 MG/ML IJ SUSP
40.0000 mg | INTRAMUSCULAR | Status: AC | PRN
  Administered 2019-07-22: 40 mg via INTRA_ARTICULAR

## 2019-07-22 MED ORDER — BUPIVACAINE HCL 0.5 % IJ SOLN
2.0000 mL | INTRAMUSCULAR | Status: AC | PRN
  Administered 2019-07-22: 2 mL via INTRA_ARTICULAR

## 2019-07-22 NOTE — Progress Notes (Signed)
Office Visit Note   Patient: Kristi [name]           Date of Birth: 1956/09/13           MRN: [id number] Visit Date: 07/22/2019              Requested by: Mosie Lukes, MD [address] [place],  [place] [address] PCP: Mosie Lukes, MD   Assessment & Plan: Visit Diagnoses:  1. Primary osteoarthritis of left knee   2. Primary osteoarthritis of right knee     Plan: Impression is right knee osteoarthritis symptomatic patellofemoral DJD.  Bilateral knee injections performed today.  I recommend over-the-counter compression knee braces.  She will continue use Voltaren gel.  Prescription for outpatient physical therapy was provided today.  Questions encouraged and answered.  Follow-up as needed.  Follow-Up Instructions: Return if symptoms worsen or fail to improve.   Orders:  No orders of the defined types were placed in this encounter.  No orders of the defined types were placed in this encounter.     Procedures: Large Joint Inj: bilateral knee on 07/22/2019 11:16 AM Indications: pain Details: 22 G needle  Arthrogram: No  Medications (Right): 2 mL lidocaine 1 %; 2 mL bupivacaine 0.5 %; 40 mg methylPREDNISolone acetate 40 MG/ML Medications (Left): 2 mL lidocaine 1 %; 2 mL bupivacaine 0.5 %; 40 mg methylPREDNISolone acetate 40 MG/ML Outcome: tolerated well, no immediate complications Patient was prepped and draped in the usual sterile fashion.       Clinical Data: No additional findings.   Subjective: Chief Complaint  Patient presents with  . Right Knee - Pain  . Left Knee - Pain    Kristi [name] is a very pleasant 63 year old female who comes in today with her daughter for evaluation of bilateral knee pain worse on the left.  She states that she has weakness and giving way and is some mild swelling is worse with using stairs.  She has a history of neuropathy in her feet.  She is also having some cognitive issues.  Denies any injuries.  She has been  falling quite a bit for multiple reasons.   Review of Systems  Constitutional: Negative.   HENT: Negative.   Eyes: Negative.   Respiratory: Negative.   Cardiovascular: Negative.   Endocrine: Negative.   Musculoskeletal: Negative.   Neurological: Negative.   Hematological: Negative.   Psychiatric/Behavioral: Negative.   All other systems reviewed and are negative.    Objective: Vital Signs: LMP 01/20/2013   Physical Exam Vitals signs and nursing note reviewed.  Constitutional:      Appearance: She is well-developed.  Pulmonary:     Effort: Pulmonary effort is normal.  Skin:    General: Skin is warm.     Capillary Refill: Capillary refill takes less than 2 seconds.  Neurological:     Mental Status: She is alert and oriented to person, place, and time.  Psychiatric:        Behavior: Behavior normal.        Thought Content: Thought content normal.        Judgment: Judgment normal.     Ortho Exam  Left knee exam shows a trace joint effusion.  Otherwise unremarkable. Right knee exam is unremarkable. Specialty Comments:  No specialty comments available.  Imaging: No results found.   PMFS History: Patient Active Problem List   Diagnosis Date Noted  . Genetic testing 02/20/2019  . S/P mastectomy, right 12/26/2018  .  Left upper extremity numbness 06/19/2018  . Cervical cancer screening 06/18/2018  . Anemia 05/14/2017  . Hyperlipidemia 05/14/2017  . History of left breast cancer 02/15/2017  . Nodule of finger of left hand 11/14/2016  . Right hip pain 11/14/2016  . Knee pain, bilateral 11/14/2016  . Constipation 11/13/2016  . Preventative health care 11/13/2016  . HTN (hypertension) 11/13/2016  . Hyperglycemia 01/09/2016  . Neuropathy of hand, left 09/21/2015  . History of chicken pox   . Shingles 05/15/2014  . Anxiety and depression 05/15/2014  . Rotator cuff tear 02/24/2014  . Left shoulder pain with abduction 12/12/2013  . CVA (cerebral infarction)  07/18/2013  . TIA (transient ischemic attack) 07/18/2013  . Contracture of axilla 05/20/2013  . Primary cancer of upper inner quadrant of left female breast ([place]) 01/24/2013  . Anal fissure 05/03/2011  . Hemorrhoids, internal, with prolapse 05/03/2011   Past Medical History:  Diagnosis Date  . Anemia    Iron deficinecy anemia  . Anemia 05/14/2017  . Anxiety   . Anxiety and depression 05/15/2014  . Arthritis   . Breast cancer ([place])    left ,last radiation 2'15, last chemo 8'14  . Constipation 11/13/2016  . Depression   . History of chicken pox   . History of radiation therapy 09/09/13-10/28/13   45 gray to left breast, lumpectomy cavity boosted to 63 gray  . HTN (hypertension) 11/13/2016  . Hyperglycemia 01/09/2016  . Hyperlipidemia 05/14/2017  . Hypertension   . MRSA (methicillin resistant Staphylococcus aureus) 2009   right groin area-no issues now. 04-07-14 PCR screen negative today.  . Neuropathy   . Preventative health care 11/13/2016    Family History  Problem Relation Age of Onset  . Lung cancer Father   . Hypertension Father   . Thyroid cancer Father        dx in his 44s  . Cancer Father        lung, thyroid, smoker  . Breast cancer Paternal Aunt 24  . Colon cancer Paternal Aunt        dx in her 50x  . Cervical cancer Paternal Aunt        dzx in her 35s  . Ovarian cancer Cousin        dx in her lage 44s  . Breast cancer Cousin        maternal first cousin, once removed; dx in her late 75s  . Breast cancer Cousin        maternal first cousin once removed; dx in late 37s  . Hypertension Mother   . Diabetes Mother   . Dementia Mother   . Hypertension Brother   . Seizures Brother        Alcohol induced.  . Alcohol abuse Brother        drinker, smoker  . Cancer Paternal Uncle        oral cancer  . Kidney cancer Paternal Grandmother   . Arthritis Daughter        back surgery  . Cancer Cousin        several paternal cousins with brain cancer, leukemia, and other  cancers  . Cancer Sister        stomach    Past Surgical History:  Procedure Laterality Date  . ANAL SPHINCTEROTOMY  04/2011  . APPENDECTOMY  1980  . AXILLARY LYMPH NODE DISSECTION Left 02/04/2013   Procedure: LEFT AXILLARY LYMPH NODE DISSECTION;  Surgeon: Stark Klein, MD;  Location: [facility];  Service: General;  Laterality: Left;  End: 1512  . BREAST LUMPECTOMY WITH NEEDLE LOCALIZATION Left 02/04/2013   Procedure: LEFT BREAST LUMPECTOMY WITH NEEDLE LOCALIZATION;  Surgeon: Stark Klein, MD;  Location: [facility];  Service: General;  Laterality: Left;  . BREAST SURGERY     Lumpectomy in april 2014  . HEMORRHOID SURGERY  04/2011   ligation  . MASTECTOMY Left 02/15/2017  . PORT-A-CATH REMOVAL N/A 04/16/2014   Procedure: REMOVAL PORT-A-CATH;  Surgeon: Stark Klein, MD;  [facility];  Service: General;  Laterality: N/A;  . PORTACATH PLACEMENT Right 02/04/2013   Procedure: INSERTION PORT-A-CATH;  Surgeon: Stark Klein, MD;  Location: [facility];  Service: General;  Laterality: Right;  Start Time: [id number].  Marland Kitchen SHOULDER ARTHROSCOPY WITH ROTATOR CUFF REPAIR AND SUBACROMIAL DECOMPRESSION Left 02/24/2014   Procedure: SHOULDER ARTHROSCOPY WITH ROTATOR CUFF REPAIR AND SUBACROMIAL DECOMPRESSION;  Surgeon: Meredith Pel, MD;  Location: [facility];  Service: Orthopedics;  Laterality: Left;  LEFT SHOULDER DIAGNOSTIC OPERATIVE ARTHROSCOPY, SUBACROMIAL DECOMPRESSION, ROTATOR CUFF TEAR REPAIR  . SIMPLE MASTECTOMY WITH AXILLARY SENTINEL NODE BIOPSY Left 02/15/2017   Procedure: LEFT MASTECTOMY;  Surgeon: Stark Klein, MD;  Location: [facility];  Service: General;  Laterality: Left;  . TOTAL MASTECTOMY Right 12/26/2018   Procedure: RIGHT BREAST PROPHYLATIC MASTECTOMY;  Surgeon: Stark Klein, MD;  Location: [facility];  Service: General;  Laterality: Right;   Social History   Occupational History  . 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 Smoking status: Never Smoker  . Smokeless tobacco:  Never Used  Substance and Sexual Activity  . Alcohol use: No  . Drug use: No  . Sexual activity: Yes    Comment: lives with husband, disability/retirement. [employer], no dietary restrictions

## 2019-07-30 ENCOUNTER — Other Ambulatory Visit: Payer: Self-pay | Admitting: Family Medicine

## 2019-08-04 DIAGNOSIS — M25559 Pain in unspecified hip: Secondary | ICD-10-CM | POA: Diagnosis not present

## 2019-08-06 DIAGNOSIS — M25561 Pain in right knee: Secondary | ICD-10-CM | POA: Diagnosis not present

## 2019-08-06 DIAGNOSIS — M25562 Pain in left knee: Secondary | ICD-10-CM | POA: Diagnosis not present

## 2019-08-11 DIAGNOSIS — M25561 Pain in right knee: Secondary | ICD-10-CM | POA: Diagnosis not present

## 2019-08-11 DIAGNOSIS — M25562 Pain in left knee: Secondary | ICD-10-CM | POA: Diagnosis not present

## 2019-08-13 DIAGNOSIS — M25561 Pain in right knee: Secondary | ICD-10-CM | POA: Diagnosis not present

## 2019-08-13 DIAGNOSIS — M25562 Pain in left knee: Secondary | ICD-10-CM | POA: Diagnosis not present

## 2019-08-18 ENCOUNTER — Other Ambulatory Visit: Payer: Self-pay

## 2019-08-20 DIAGNOSIS — M25561 Pain in right knee: Secondary | ICD-10-CM | POA: Diagnosis not present

## 2019-08-20 DIAGNOSIS — R296 Repeated falls: Secondary | ICD-10-CM | POA: Diagnosis not present

## 2019-08-20 DIAGNOSIS — M25562 Pain in left knee: Secondary | ICD-10-CM | POA: Diagnosis not present

## 2019-08-21 ENCOUNTER — Encounter: Payer: Self-pay | Admitting: Family Medicine

## 2019-08-21 ENCOUNTER — Other Ambulatory Visit: Payer: Self-pay

## 2019-08-21 ENCOUNTER — Ambulatory Visit (INDEPENDENT_AMBULATORY_CARE_PROVIDER_SITE_OTHER): Payer: Medicare Other | Admitting: Family Medicine

## 2019-08-21 ENCOUNTER — Ambulatory Visit: Payer: Medicare Other | Admitting: Family Medicine

## 2019-08-21 VITALS — BP 134/80 | HR 86 | Temp 97.4°F | Resp 18 | Wt 176.2 lb

## 2019-08-21 DIAGNOSIS — M25561 Pain in right knee: Secondary | ICD-10-CM

## 2019-08-21 DIAGNOSIS — E785 Hyperlipidemia, unspecified: Secondary | ICD-10-CM | POA: Diagnosis not present

## 2019-08-21 DIAGNOSIS — D649 Anemia, unspecified: Secondary | ICD-10-CM

## 2019-08-21 DIAGNOSIS — R739 Hyperglycemia, unspecified: Secondary | ICD-10-CM | POA: Diagnosis not present

## 2019-08-21 DIAGNOSIS — C50212 Malignant neoplasm of upper-inner quadrant of left female breast: Secondary | ICD-10-CM

## 2019-08-21 DIAGNOSIS — I10 Essential (primary) hypertension: Secondary | ICD-10-CM

## 2019-08-21 DIAGNOSIS — M25562 Pain in left knee: Secondary | ICD-10-CM

## 2019-08-21 DIAGNOSIS — G8929 Other chronic pain: Secondary | ICD-10-CM

## 2019-08-21 DIAGNOSIS — G3184 Mild cognitive impairment, so stated: Secondary | ICD-10-CM

## 2019-08-21 MED ORDER — CLONAZEPAM 0.5 MG PO TABS: 0.5000 mg | ORAL_TABLET | Freq: Two times a day (BID) | ORAL | 1 refills | Status: DC | PRN

## 2019-08-21 NOTE — Patient Instructions (Signed)
Consider Prevagen or fish oil  Omron blood pressure cuff  Pulse oximeter want oxygen in 90s  Multivitamin with minerals, selenium, zinc, vitamin c and vitamin d

## 2019-08-22 LAB — CBC
HCT: 36.3 % (ref 36.0–46.0)
Hemoglobin: 11.7 g/dL — ABNORMAL LOW (ref 12.0–15.0)
MCHC: 32.4 g/dL (ref 30.0–36.0)
MCV: 88.7 fl (ref 78.0–100.0)
Platelets: 172 10*3/uL (ref 150.0–400.0)
RBC: 4.09 Mil/uL (ref 3.87–5.11)
RDW: 15.4 % (ref 11.5–15.5)
WBC: 7.6 10*3/uL (ref 4.0–10.5)

## 2019-08-22 LAB — IBC PANEL
Iron: 48 ug/dL (ref 42–145)
Saturation Ratios: 14.9 % — ABNORMAL LOW (ref 20.0–50.0)
Transferrin: 230 mg/dL (ref 212.0–360.0)

## 2019-08-22 LAB — FERRITIN: Ferritin: 47.8 ng/mL (ref 10.0–291.0)

## 2019-08-24 DIAGNOSIS — G3184 Mild cognitive impairment, so stated: Secondary | ICD-10-CM

## 2019-08-24 HISTORY — DX: Mild cognitive impairment, so stated: G31.84

## 2019-08-24 NOTE — Assessment & Plan Note (Signed)
She is accompanied by her husband and they are concerned about her increasing forgetfulness over the past couple of months as well as some trouble with word finding and falls. She is referred to neurology for further consideration and several medications were discontinued.

## 2019-08-24 NOTE — Assessment & Plan Note (Signed)
Is tolerating Arimedex and has been on it for 5 years.

## 2019-08-24 NOTE — Assessment & Plan Note (Signed)
Encouraged heart healthy diet, increase exercise, avoid trans fats, consider a krill oil cap daily 

## 2019-08-24 NOTE — Progress Notes (Signed)
Subjective:    Patient ID: Kristi [name], female    DOB: January 29, 1956, 63 y.o.   MRN: [id number]  No chief complaint on file.   HPI Patient is in today for follow up on chronic medical concerns including hyperglycemia, hypertension andmore. They are mostly concerned a recent increase in memory loss, increased trouble with word finding and increased falls. She did injure her knees and has been seen by ortho and received shots but pain persists. She is also noting left foot pain and movement at night. Denies CP/palp/SOB/HA/congestion/fevers/GI or GU c/o. Taking meds as prescribed  Past Medical History:  Diagnosis Date  . Anemia    Iron deficinecy anemia  . Anemia 05/14/2017  . Anxiety   . Anxiety and depression 05/15/2014  . Arthritis   . Breast cancer ([place])    left ,last radiation 2'15, last chemo 8'14  . Constipation 11/13/2016  . Depression   . History of chicken pox   . History of radiation therapy 09/09/13-10/28/13   45 gray to left breast, lumpectomy cavity boosted to 63 gray  . HTN (hypertension) 11/13/2016  . Hyperglycemia 01/09/2016  . Hyperlipidemia 05/14/2017  . Hypertension   . MRSA (methicillin resistant Staphylococcus aureus) 2009   right groin area-no issues now. 04-07-14 PCR screen negative today.  . Neuropathy   . Preventative health care 11/13/2016    Past Surgical History:  Procedure Laterality Date  . ANAL SPHINCTEROTOMY  04/2011  . APPENDECTOMY  1980  . AXILLARY LYMPH NODE DISSECTION Left 02/04/2013   Procedure: LEFT AXILLARY LYMPH NODE DISSECTION;  Surgeon: Stark Klein, MD;  Location: [facility];  Service: General;  Laterality: Left;  End: 4970  . BREAST LUMPECTOMY WITH NEEDLE LOCALIZATION Left 02/04/2013   Procedure: LEFT BREAST LUMPECTOMY WITH NEEDLE LOCALIZATION;  Surgeon: Stark Klein, MD;  Location: [facility];  Service: General;  Laterality: Left;  . BREAST SURGERY     Lumpectomy in april 2014  . HEMORRHOID SURGERY  04/2011   ligation  . MASTECTOMY Left 02/15/2017  .  PORT-A-CATH REMOVAL N/A 04/16/2014   Procedure: REMOVAL PORT-A-CATH;  Surgeon: Stark Klein, MD;  Location: [facility];  Service: General;  Laterality: N/A;  . PORTACATH PLACEMENT Right 02/04/2013   Procedure: INSERTION PORT-A-CATH;  Surgeon: Stark Klein, MD;  Location: [facility];  Service: General;  Laterality: Right;  Start Time: 2637.  Marland Kitchen SHOULDER ARTHROSCOPY WITH ROTATOR CUFF REPAIR AND SUBACROMIAL DECOMPRESSION Left 02/24/2014   Procedure: SHOULDER ARTHROSCOPY WITH ROTATOR CUFF REPAIR AND SUBACROMIAL DECOMPRESSION;  Surgeon: Meredith Pel, MD;  Location: [facility];  Service: Orthopedics;  Laterality: Left;  LEFT SHOULDER DIAGNOSTIC OPERATIVE ARTHROSCOPY, SUBACROMIAL DECOMPRESSION, ROTATOR CUFF TEAR REPAIR  . SIMPLE MASTECTOMY WITH AXILLARY SENTINEL NODE BIOPSY Left 02/15/2017   Procedure: LEFT MASTECTOMY;  Surgeon: Stark Klein, MD;  Location: [facility];  Service: General;  Laterality: Left;  . TOTAL MASTECTOMY Right 12/26/2018   Procedure: RIGHT BREAST PROPHYLATIC MASTECTOMY;  Surgeon: Stark Klein, MD;  Location: [facility];  Service: General;  Laterality: Right;    Family History  Problem Relation Age of Onset  . Lung cancer Father   . Hypertension Father   . Thyroid cancer Father        dx in his 8s  . Cancer Father        lung, thyroid, smoker  . Breast cancer Paternal Aunt 60  . Colon cancer Paternal Aunt        dx in her 50x  . Cervical cancer Paternal Aunt  dzx in her 74s  . Ovarian cancer Cousin        dx in her lage 35s  . Breast cancer Cousin        maternal first cousin, once removed; dx in her late 51s  . Breast cancer Cousin        maternal first cousin once removed; dx in late 59s  . Hypertension Mother   . Diabetes Mother   . Dementia Mother   . Hypertension Brother   . Seizures Brother        Alcohol induced.  . Alcohol abuse Brother        drinker, smoker  . Cancer Paternal Uncle        oral cancer  . Kidney cancer Paternal Grandmother   . Arthritis Daughter         back surgery  . Cancer Cousin        several paternal cousins with brain cancer, leukemia, and other cancers  . Cancer Sister        stomach    Social History   Socioeconomic History  . Marital status: Married    Spouse name: [name]  . Number of children: 1  . Years of education: 37  . Highest education level: Not on file  Occupational History  . Occupation: INVENTORY Hotel manager: [employer]  Social Needs  . Financial resource strain: Not on file  . Food insecurity    Worry: Not on file    Inability: Not on file  . Transportation needs    Medical: Not on file    Non-medical: Not on file  Tobacco Use  . Smoking status: Never Smoker  . Smokeless tobacco: Never Used  Substance and Sexual Activity  . Alcohol use: No  . Drug use: No  . Sexual activity: Yes    Comment: lives with husband, disability/retirement. [employer], no dietary restrictions  Lifestyle  . Physical activity    Days per week: Not on file    Minutes per session: Not on file  . Stress: Not on file  Relationships  . Social Herbalist on phone: Not on file    Gets together: Not on file    Attends religious service: Not on file    Active member of club or organization: Not on file    Attends meetings of clubs or organizations: Not on file    Relationship status: Not on file  . Intimate partner violence    Fear of current or ex partner: Not on file    Emotionally abused: Not on file    Physically abused: Not on file    Forced sexual activity: Not on file  Other Topics Concern  . Not on file  Social History Narrative   Patient is married [name]) and lives at home with her husband.   Patient has one daughter.   Patient is working at [employer]   Patient has a 12th grade education.   Patient drinks very little caffeine.    Outpatient Medications Prior to Visit  Medication Sig Dispense Refill  . amitriptyline (ELAVIL) 100 MG tablet Take 100 mg by mouth at bedtime.   2  . anastrozole (ARIMIDEX) 1 MG tablet Take 1 tablet (1 mg total) by mouth daily. TAKE 1 TABLET (1 MG) BY MOUTH DAILY IN THE MORNING 90 tablet 3  . aspirin EC 81 MG tablet Take 1 tablet (81 mg total) by mouth daily.    Marland Kitchen  B Complex-C (SUPER B COMPLEX PO) Take 1 tablet by mouth daily.    Marland Kitchen gabapentin (NEURONTIN) 800 MG tablet TAKE 1 TABLET BY MOUTH THREE TIMES A DAY 270 tablet 1  . hydrOXYzine (VISTARIL) 25 MG capsule Take 25 mg by mouth 3 (three) times daily.   1  . KLOR-CON M20 20 MEQ tablet TAKE 1 TABLET BY MOUTH EVERY DAY 90 tablet 1  . LATUDA 80 MG TABS tablet Take 80 mg by mouth at bedtime.   1  . lidocaine-prilocaine (EMLA) cream APPLY EXTERNALLY TO THE AFFECTED AREA TWICE DAILY AS DIRECTED 30 g 3  . meloxicam (MOBIC) 7.5 MG tablet Take 1 tablet (7.5 mg total) by mouth daily as needed for pain. 30 tablet 1  . Multiple Vitamin (MULTIVITAMIN WITH MINERALS) TABS tablet Take 1 tablet by mouth daily.    . valsartan-hydrochlorothiazide (DIOVAN-HCT) 320-25 MG tablet TAKE 1 TABLET BY MOUTH EVERY DAY IN THE MORNING 90 tablet 1  . clonazePAM (KLONOPIN) 1 MG tablet TAKE 1 TABLET BY MOUTH EVERY MORNING AND 1/2 TABLET AT BEDTIME (Patient taking differently: Take 0.5-1 mg by mouth See admin instructions. Take 1 mg by mouth in the morning and 0.5 mg at bedtime) 45 tablet 1  . HYDROcodone-acetaminophen (NORCO/VICODIN) 5-325 MG tablet Take 1-2 tablets by mouth every 6 (six) hours as needed. For neuropathy/pain. 30 tablet 0  . methocarbamol (ROBAXIN) 500 MG tablet Take 1 tablet (500 mg total) by mouth every 6 (six) hours as needed for muscle spasms. 20 tablet 1   No facility-administered medications prior to visit.     No Known Allergies  Review of Systems  Constitutional: Positive for malaise/fatigue. Negative for fever.  HENT: Negative for congestion.   Eyes: Negative for blurred vision.  Respiratory: Negative for shortness of breath.   Cardiovascular: Negative for chest pain, palpitations and leg  swelling.  Gastrointestinal: Negative for abdominal pain, blood in stool and nausea.  Genitourinary: Negative for dysuria and frequency.  Musculoskeletal: Positive for falls and joint pain.  Skin: Negative for rash.  Neurological: Negative for dizziness, loss of consciousness and headaches.  Endo/Heme/Allergies: Negative for environmental allergies.  Psychiatric/Behavioral: Positive for memory loss. Negative for depression. The patient is nervous/anxious.        Objective:    Physical Exam Vitals signs and nursing note reviewed.  Constitutional:      General: She is not in acute distress.    Appearance: She is well-developed.  HENT:     Head: Normocephalic and atraumatic.     Nose: Nose normal.  Eyes:     General:        Right eye: No discharge.        Left eye: No discharge.  Neck:     Musculoskeletal: Normal range of motion and neck supple.  Cardiovascular:     Rate and Rhythm: Normal rate and regular rhythm.     Heart sounds: No murmur.  Pulmonary:     Effort: Pulmonary effort is normal.     Breath sounds: Normal breath sounds.  Abdominal:     General: Bowel sounds are normal.     Palpations: Abdomen is soft.     Tenderness: There is no abdominal tenderness.  Skin:    General: Skin is warm and dry.  Neurological:     Mental Status: She is alert.     Cranial Nerves: No cranial nerve deficit.     Coordination: Coordination normal.     Deep Tendon Reflexes: Reflexes normal.  Comments: She was aware of self and location but not year.      BP 134/80   Pulse 86   Temp (!) 97.4 F (36.3 C) (Temporal)   Resp 18   Wt 176 lb 3.2 oz (79.9 kg)   LMP 01/20/2013   SpO2 95%   BMI 32.23 kg/m  Wt Readings from Last 3 Encounters:  08/21/19 176 lb 3.2 oz (79.9 kg)  07/15/19 177 lb (80.3 kg)  06/19/19 176 lb (79.8 kg)    Diabetic Foot Exam - Simple   No data filed     Lab Results  Component Value Date   WBC 7.6 08/21/2019   HGB 11.7 (L) 08/21/2019   HCT 36.3  08/21/2019   PLT 172.0 08/21/2019   GLUCOSE 95 06/19/2019   CHOL 170 06/19/2019   TRIG 102.0 06/19/2019   HDL 47.10 06/19/2019   LDLCALC 103 (H) 06/19/2019   ALT 19 06/19/2019   AST 20 06/19/2019   NA 140 06/19/2019   K 4.4 06/19/2019   CL 102 06/19/2019   CREATININE 1.15 06/19/2019   BUN 15 06/19/2019   CO2 30 06/19/2019   TSH 1.29 06/19/2019   INR 1.02 07/19/2013   HGBA1C 5.9 06/19/2019    Lab Results  Component Value Date   TSH 1.29 06/19/2019   Lab Results  Component Value Date   WBC 7.6 08/21/2019   HGB 11.7 (L) 08/21/2019   HCT 36.3 08/21/2019   MCV 88.7 08/21/2019   PLT 172.0 08/21/2019   Lab Results  Component Value Date   NA 140 06/19/2019   K 4.4 06/19/2019   CHLORIDE 104 01/22/2017   CO2 30 06/19/2019   GLUCOSE 95 06/19/2019   BUN 15 06/19/2019   CREATININE 1.15 06/19/2019   BILITOT 0.2 06/19/2019   ALKPHOS 89 06/19/2019   AST 20 06/19/2019   ALT 19 06/19/2019   PROT 7.1 06/19/2019   ALBUMIN 4.4 06/19/2019   CALCIUM 10.2 06/19/2019   ANIONGAP 5 12/28/2018   EGFR 67 (L) 01/22/2017   GFR 57.41 (L) 06/19/2019   Lab Results  Component Value Date   CHOL 170 06/19/2019   Lab Results  Component Value Date   HDL 47.10 06/19/2019   Lab Results  Component Value Date   LDLCALC 103 (H) 06/19/2019   Lab Results  Component Value Date   TRIG 102.0 06/19/2019   Lab Results  Component Value Date   CHOLHDL 4 06/19/2019   Lab Results  Component Value Date   HGBA1C 5.9 06/19/2019       Assessment & Plan:   Problem List Items Addressed This Visit    Primary cancer of upper inner quadrant of left female breast ([place])    Is tolerating Arimedex and has been on it for 5 years.       Hyperglycemia    hgba1c acceptable, minimize simple carbs. Increase exercise as tolerated.       HTN (hypertension)    Well controlled, no changes to meds. Encouraged heart healthy diet such as the DASH diet and exercise as tolerated.       Knee pain, bilateral     Is following with ortho and has had some shots in her knees but pain persists. Can try topical treatments and stay as active as possible.       Anemia - Primary   Relevant Orders   CBC (Completed)   IBC panel (Completed)   Ferritin (Completed)   Hyperlipidemia    Encouraged heart healthy diet,  increase exercise, avoid trans fats, consider a krill oil cap daily      Mild cognitive impairment    She is accompanied by her husband and they are concerned about her increasing forgetfulness over the past couple of months as well as some trouble with word finding and falls. She is referred to neurology for further consideration and several medications were discontinued.          I have discontinued [name] "Theresa"'s clonazePAM, methocarbamol, and HYDROcodone-acetaminophen. I am also having her start on clonazePAM. Additionally, I am having her maintain her B Complex-C (SUPER B COMPLEX PO), amitriptyline, multivitamin with minerals, Latuda, hydrOXYzine, gabapentin, Klor-Con M20, anastrozole, lidocaine-prilocaine, aspirin EC, meloxicam, and valsartan-hydrochlorothiazide.  Meds ordered this encounter  Medications  . clonazePAM (KLONOPIN) 0.5 MG tablet    Sig: Take 1 tablet (0.5 mg total) by mouth 2 (two) times daily as needed for anxiety.    Dispense:  20 tablet    Refill:  1     [name], MD

## 2019-08-24 NOTE — Assessment & Plan Note (Signed)
hgba1c acceptable, minimize simple carbs. Increase exercise as tolerated.  

## 2019-08-24 NOTE — Assessment & Plan Note (Signed)
Is following with ortho and has had some shots in her knees but pain persists. Can try topical treatments and stay as active as possible.

## 2019-08-24 NOTE — Assessment & Plan Note (Signed)
Well controlled, no changes to meds. Encouraged heart healthy diet such as the DASH diet and exercise as tolerated.  °

## 2019-08-25 ENCOUNTER — Other Ambulatory Visit: Payer: Self-pay

## 2019-08-25 ENCOUNTER — Emergency Department (HOSPITAL_BASED_OUTPATIENT_CLINIC_OR_DEPARTMENT_OTHER): Payer: Medicare Other

## 2019-08-25 ENCOUNTER — Emergency Department (HOSPITAL_BASED_OUTPATIENT_CLINIC_OR_DEPARTMENT_OTHER)
Admission: EM | Admit: 2019-08-25 | Discharge: 2019-08-25 | Disposition: A | Discharge status: Home / Self Care | Payer: Medicare Other | Attending: Emergency Medicine | Admitting: Emergency Medicine

## 2019-08-25 ENCOUNTER — Encounter (HOSPITAL_BASED_OUTPATIENT_CLINIC_OR_DEPARTMENT_OTHER): Payer: Self-pay | Admitting: *Deleted

## 2019-08-25 DIAGNOSIS — Y939 Activity, unspecified: Secondary | ICD-10-CM | POA: Insufficient documentation

## 2019-08-25 DIAGNOSIS — Z853 Personal history of malignant neoplasm of breast: Secondary | ICD-10-CM | POA: Diagnosis not present

## 2019-08-25 DIAGNOSIS — Y999 Unspecified external cause status: Secondary | ICD-10-CM | POA: Diagnosis not present

## 2019-08-25 DIAGNOSIS — Y929 Unspecified place or not applicable: Secondary | ICD-10-CM | POA: Diagnosis not present

## 2019-08-25 DIAGNOSIS — W108XXA Fall (on) (from) other stairs and steps, initial encounter: Secondary | ICD-10-CM | POA: Diagnosis not present

## 2019-08-25 DIAGNOSIS — R0789 Other chest pain: Secondary | ICD-10-CM | POA: Diagnosis not present

## 2019-08-25 DIAGNOSIS — W19XXXA Unspecified fall, initial encounter: Secondary | ICD-10-CM

## 2019-08-25 DIAGNOSIS — I10 Essential (primary) hypertension: Secondary | ICD-10-CM | POA: Diagnosis not present

## 2019-08-25 DIAGNOSIS — Z7982 Long term (current) use of aspirin: Secondary | ICD-10-CM | POA: Diagnosis not present

## 2019-08-25 DIAGNOSIS — Z9011 Acquired absence of right breast and nipple: Secondary | ICD-10-CM | POA: Diagnosis not present

## 2019-08-25 DIAGNOSIS — R0781 Pleurodynia: Secondary | ICD-10-CM | POA: Diagnosis not present

## 2019-08-25 DIAGNOSIS — T148XXA Other injury of unspecified body region, initial encounter: Secondary | ICD-10-CM | POA: Diagnosis not present

## 2019-08-25 DIAGNOSIS — Z79899 Other long term (current) drug therapy: Secondary | ICD-10-CM | POA: Insufficient documentation

## 2019-08-25 DIAGNOSIS — Z8673 Personal history of transient ischemic attack (TIA), and cerebral infarction without residual deficits: Secondary | ICD-10-CM | POA: Diagnosis not present

## 2019-08-25 DIAGNOSIS — R2689 Other abnormalities of gait and mobility: Secondary | ICD-10-CM | POA: Diagnosis present

## 2019-08-25 DIAGNOSIS — S20211A Contusion of right front wall of thorax, initial encounter: Secondary | ICD-10-CM | POA: Diagnosis not present

## 2019-08-25 DIAGNOSIS — S4992XA Unspecified injury of left shoulder and upper arm, initial encounter: Secondary | ICD-10-CM | POA: Diagnosis not present

## 2019-08-25 DIAGNOSIS — R296 Repeated falls: Secondary | ICD-10-CM | POA: Diagnosis not present

## 2019-08-25 NOTE — ED Triage Notes (Signed)
Her legs gave way and she fell this am. She fell down steps and landed on her buttocks. She fell 3 days ago when she misjudged a step and fell. Hx of frequent falls. She is being scheduled for a neuro consult. She is having PT at this time to help improve her balance. She is here today with bruising, back pain and right rib pain.

## 2019-08-25 NOTE — ED Provider Notes (Signed)
[facility] EMERGENCY DEPARTMENT Provider Note   CSN: [id number] Arrival date & time: 08/25/19  1321     History   Chief Complaint Chief Complaint  Patient presents with  . Fall    HPI Kristi [name] is a 63 y.o. female.     The history is provided by the patient and medical records. No language interpreter was used.  Fall This is a recurrent problem. The current episode started 6 to 12 hours ago. The problem occurs constantly. The problem has not changed since onset.Associated symptoms include chest pain. Pertinent negatives include no abdominal pain, no headaches and no shortness of breath. Nothing aggravates the symptoms. Nothing relieves the symptoms. She has tried nothing for the symptoms. The treatment provided no relief.    Past Medical History:  Diagnosis Date  . Anemia    Iron deficinecy anemia  . Anemia 05/14/2017  . Anxiety   . Anxiety and depression 05/15/2014  . Arthritis   . Breast cancer ([place])    left ,last radiation 2'15, last chemo 8'14  . Constipation 11/13/2016  . Depression   . History of chicken pox   . History of radiation therapy 09/09/13-10/28/13   45 gray to left breast, lumpectomy cavity boosted to 63 gray  . HTN (hypertension) 11/13/2016  . Hyperglycemia 01/09/2016  . Hyperlipidemia 05/14/2017  . Hypertension   . MRSA (methicillin resistant Staphylococcus aureus) 2009   right groin area-no issues now. 04-07-14 PCR screen negative today.  . Neuropathy   . Preventative health care 11/13/2016    Patient Active Problem List   Diagnosis Date Noted  . Mild cognitive impairment 08/24/2019  . Genetic testing 02/20/2019  . S/P mastectomy, right 12/26/2018  . Left upper extremity numbness 06/19/2018  . Cervical cancer screening 06/18/2018  . Anemia 05/14/2017  . Hyperlipidemia 05/14/2017  . History of left breast cancer 02/15/2017  . Nodule of finger of left hand 11/14/2016  . Right hip pain 11/14/2016  . Knee pain, bilateral 11/14/2016  .  Constipation 11/13/2016  . Preventative health care 11/13/2016  . HTN (hypertension) 11/13/2016  . Hyperglycemia 01/09/2016  . Neuropathy of hand, left 09/21/2015  . History of chicken pox   . Shingles 05/15/2014  . Anxiety and depression 05/15/2014  . Rotator cuff tear 02/24/2014  . Left shoulder pain with abduction 12/12/2013  . CVA (cerebral infarction) 07/18/2013  . TIA (transient ischemic attack) 07/18/2013  . Contracture of axilla 05/20/2013  . Primary cancer of upper inner quadrant of left female breast ([place]) 01/24/2013  . Anal fissure 05/03/2011  . Hemorrhoids, internal, with prolapse 05/03/2011    Past Surgical History:  Procedure Laterality Date  . ANAL SPHINCTEROTOMY  04/2011  . APPENDECTOMY  1980  . AXILLARY LYMPH NODE DISSECTION Left 02/04/2013   Procedure: LEFT AXILLARY LYMPH NODE DISSECTION;  Surgeon: Stark Klein, MD;  Location: [facility];  Service: General;  Laterality: Left;  End: [id number]  . BREAST LUMPECTOMY WITH NEEDLE LOCALIZATION Left 02/04/2013   Procedure: LEFT BREAST LUMPECTOMY WITH NEEDLE LOCALIZATION;  Surgeon: Stark Klein, MD;  Location: [facility];  Service: General;  Laterality: Left;  . BREAST SURGERY     Lumpectomy in april 2014  . HEMORRHOID SURGERY  04/2011   ligation  . MASTECTOMY Left 02/15/2017  . PORT-A-CATH REMOVAL N/A 04/16/2014   Procedure: REMOVAL PORT-A-CATH;  Surgeon: Stark Klein, MD;  Location: [facility];  Service: General;  Laterality: N/A;  . PORTACATH PLACEMENT Right 02/04/2013   Procedure: INSERTION PORT-A-CATH;  Surgeon: Dorris Fetch  Barry Dienes, MD;  Location: [facility];  Service: General;  Laterality: Right;  Start [id number].  Marland Kitchen SHOULDER ARTHROSCOPY WITH ROTATOR CUFF REPAIR AND SUBACROMIAL DECOMPRESSION Left 02/24/2014   Procedure: SHOULDER ARTHROSCOPY WITH ROTATOR CUFF REPAIR AND SUBACROMIAL DECOMPRESSION;  Surgeon: Meredith Pel, MD;  Location: [facility];  Service: Orthopedics;  Laterality: Left;  LEFT SHOULDER DIAGNOSTIC OPERATIVE ARTHROSCOPY, SUBACROMIAL  DECOMPRESSION, ROTATOR CUFF TEAR REPAIR  . SIMPLE MASTECTOMY WITH AXILLARY SENTINEL NODE BIOPSY Left 02/15/2017   Procedure: LEFT MASTECTOMY;  Surgeon: Stark Klein, MD;  Location: [facility];  Service: General;  Laterality: Left;  . TOTAL MASTECTOMY Right 12/26/2018   Procedure: RIGHT BREAST PROPHYLATIC MASTECTOMY;  Surgeon: Stark Klein, MD;  Location: [place];  Service: General;  Laterality: Right;     OB History   No obstetric history on file.      Home Medications    Prior to Admission medications   Medication Sig Start Date End Date Taking? Authorizing Provider  amitriptyline (ELAVIL) 100 MG tablet Take 100 mg by mouth at bedtime. 11/16/16   [provider]  anastrozole (ARIMIDEX) 1 MG tablet Take 1 tablet (1 mg total) by mouth daily. TAKE 1 TABLET (1 MG) BY MOUTH DAILY IN THE MORNING 05/22/19   Nicholas Lose, MD  aspirin EC 81 MG tablet Take 1 tablet (81 mg total) by mouth daily. 06/19/19   Mosie Lukes, MD  B Complex-C (SUPER B COMPLEX PO) Take 1 tablet by mouth daily.    [provider]  clonazePAM (KLONOPIN) 0.5 MG tablet Take 1 tablet (0.5 mg total) by mouth 2 (two) times daily as needed for anxiety. 08/21/19   Mosie Lukes, MD  gabapentin (NEURONTIN) 800 MG tablet TAKE 1 TABLET BY MOUTH THREE TIMES A DAY 01/29/19   Nicholas Lose, MD  hydrOXYzine (VISTARIL) 25 MG capsule Take 25 mg by mouth 3 (three) times daily.  07/15/17   [provider]  KLOR-CON M20 20 MEQ tablet TAKE 1 TABLET BY MOUTH EVERY DAY 05/16/19   Mosie Lukes, MD  LATUDA 80 MG TABS tablet Take 80 mg by mouth at bedtime.  07/17/17   [provider]  lidocaine-prilocaine (EMLA) cream APPLY EXTERNALLY TO THE AFFECTED AREA TWICE DAILY AS DIRECTED 06/19/19   Mosie Lukes, MD  meloxicam (MOBIC) 7.5 MG tablet Take 1 tablet (7.5 mg total) by mouth daily as needed for pain. 07/15/19   Mosie Lukes, MD  Multiple Vitamin (MULTIVITAMIN WITH MINERALS) TABS tablet Take 1 tablet by mouth daily.     [provider]  valsartan-hydrochlorothiazide (DIOVAN-HCT) 320-25 MG tablet TAKE 1 TABLET BY MOUTH EVERY DAY IN THE MORNING 07/31/19   Mosie Lukes, MD  prochlorperazine (COMPAZINE) 10 MG tablet Take 1 tablet (10 mg total) by mouth every 6 (six) hours as needed (Nausea or vomiting). 05/07/13 07/24/13  Gardenia Phlegm, NP  prochlorperazine (COMPAZINE) 25 MG suppository Place 1 suppository (25 mg total) rectally every 12 (twelve) hours as needed for nausea. 05/07/13 07/24/13  Gardenia Phlegm, NP    Family History Family History  Problem Relation Age of Onset  . Lung cancer Father   . Hypertension Father   . Thyroid cancer Father        dx in his 81s  . Cancer Father        lung, thyroid, smoker  . Breast cancer Paternal Aunt 32  . Colon cancer Paternal Aunt        dx in her 50x  .  Cervical cancer Paternal Aunt        dzx in her 43s  . Ovarian cancer Cousin        dx in her lage 63s  . Breast cancer Cousin        maternal first cousin, once removed; dx in her late 85s  . Breast cancer Cousin        maternal first cousin once removed; dx in late 57s  . Hypertension Mother   . Diabetes Mother   . Dementia Mother   . Hypertension Brother   . Seizures Brother        Alcohol induced.  . Alcohol abuse Brother        drinker, smoker  . Cancer Paternal Uncle        oral cancer  . Kidney cancer Paternal Grandmother   . Arthritis Daughter        back surgery  . Cancer Cousin        several paternal cousins with brain cancer, leukemia, and other cancers  . Cancer Sister        stomach    Social History Social History   Tobacco Use  . Smoking status: Never Smoker  . Smokeless tobacco: Never Used  Substance Use Topics  . Alcohol use: No  . Drug use: No     Allergies   Patient has no known allergies.   Review of Systems Review of Systems  Constitutional: Negative for chills, diaphoresis, fatigue and fever.  HENT: Negative for congestion.    Eyes: Negative for visual disturbance.  Respiratory: Negative for cough, chest tightness, shortness of breath and wheezing.   Cardiovascular: Positive for chest pain. Negative for palpitations and leg swelling.  Gastrointestinal: Negative for abdominal pain, constipation, diarrhea, nausea and vomiting.  Genitourinary: Negative for flank pain and frequency.  Musculoskeletal: Negative for back pain, neck pain and neck stiffness.  Skin: Positive for color change (bruising). Negative for wound.  Neurological: Negative for dizziness, seizures, weakness, light-headedness, numbness and headaches.  Psychiatric/Behavioral: Negative for agitation and confusion.  All other systems reviewed and are negative.    Physical Exam Updated Vital Signs BP 128/81   Pulse 100   Temp 98.8 F (37.1 C) (Oral)   Resp 14   Ht 5\' 2"  (1.575 m)   Wt 79.8 kg   LMP 01/20/2013   SpO2 99%   BMI 32.19 kg/m   Physical Exam Vitals signs and nursing note reviewed.  Constitutional:      General: She is not in acute distress.    Appearance: Normal appearance. She is well-developed. She is not ill-appearing, toxic-appearing or diaphoretic.  HENT:     Head: Normocephalic and atraumatic.     Right Ear: External ear normal.     Left Ear: External ear normal.     Nose: Nose normal. No congestion or rhinorrhea.     Mouth/Throat:     Mouth: Mucous membranes are moist.     Pharynx: Oropharynx is clear. No oropharyngeal exudate or posterior oropharyngeal erythema.  Eyes:     Extraocular Movements: Extraocular movements intact.     Conjunctiva/sclera: Conjunctivae normal.     Pupils: Pupils are equal, round, and reactive to light.  Neck:     Musculoskeletal: Normal range of motion and neck supple.  Cardiovascular:     Rate and Rhythm: Normal rate.     Pulses: Normal pulses.     Heart sounds: No murmur.  Pulmonary:     Effort: Pulmonary effort is normal.  No respiratory distress.     Breath sounds: No stridor. No  wheezing, rhonchi or rales.  Chest:     Chest wall: Tenderness present. No crepitus.       Comments: Bruising on right lateral chest wall with tenderness.  Breath sounds are equal bilaterally.  No other chest or back tenderness. Abdominal:     General: Abdomen is flat. There is no distension.     Tenderness: There is no abdominal tenderness. There is no right CVA tenderness, left CVA tenderness or rebound.  Musculoskeletal:        General: Tenderness and signs of injury present.     Left upper arm: She exhibits tenderness. She exhibits no deformity and no laceration.       Arms:     Right lower leg: No edema.     Left lower leg: No edema.  Skin:    General: Skin is warm.     Capillary Refill: Capillary refill takes less than 2 seconds.     Findings: Bruising present. No erythema or rash.  Neurological:     General: No focal deficit present.     Mental Status: She is alert and oriented to person, place, and time.     Sensory: No sensory deficit.     Motor: No weakness or abnormal muscle tone.     Deep Tendon Reflexes: Reflexes are normal and symmetric.  Psychiatric:        Mood and Affect: Mood normal.      ED Treatments / Results  Labs (all labs ordered are listed, but only abnormal results are displayed) Labs Reviewed - No data to display  EKG None  Radiology Dg Ribs Unilateral W/chest Right  Result Date: 08/25/2019 CLINICAL DATA:  Right rib pain following multiple recent falls. EXAM: RIGHT RIBS AND CHEST - 3+ VIEW COMPARISON:  Chest radiographs dated 02/19/2014. FINDINGS: Normal sized heart. Clear lungs. Left axillary surgical clips. Thoracic spine degenerative changes. No rib fracture or pneumothorax. IMPRESSION: No acute abnormality. Electronically Signed   By: Claudie Revering M.D.   On: 08/25/2019 13:52    Procedures Procedures (including critical care time)  Medications Ordered in ED Medications - No data to display   Initial Impression / Assessment and Plan /  ED Course  I have reviewed the triage vital signs and the nursing notes.  Pertinent labs & imaging results that were available during my care of the patient were reviewed by me and considered in my medical decision making (see chart for details).        Kristi [name] is a 63 y.o. female with a past medical history significant for hypertension, hyperlipidemia, prior breast cancer status post mastectomies, anemia, prior stroke/TIA, cognitive impairment, and recurrent falls with neuropathy in the legs who presents with fall and right torso pain.  Patient reports that she had a fall today after standing up she fell hitting her right lateral chest on the ground.  She reports that with her neuropathies, her legs are sometimes ill positioned and she thinks this caused her to fall.  She reports she did not get lightheaded, have palpitations, chest pain, or shortness of breath associated with the fall.  She reports this is happened frequently to her.  She is currently scheduled see a neurologist upcoming and is currently seeing physical therapy for the last 3 weeks to help with her strength, coordination, and mobility.  She says that she chronically has bruising on her left shin which he thinks is due  to following up with several stairs in the home.  She also reports bruising and pain in her left upper arm from the fall today.  She denies headache, neck pain, back pain, abdominal pain, chest pain, or other extremity pains.  On exam, patient had tenderness in the right lateral chest with bruising.  Breath sounds are equal and no crepitance.  Patient's neck was nontender with normal mobility.  Patient had bruising in the left upper arm but had good grip strength, sensation, and pulse in the upper extremities.  Patient's abdomen and chest were nontender.  Patient moving legs normally.  Good pulses.  No focal neurologic deficit seen.  Patient had x-ray of the chest and right lateral ribs which showed no acute  injuries.  Had a long shared decision made conversation with patient and family about further work-up.  Patient was offered labs and even potentially CT imaging however they primarily here to make sure she did have her fractures and since the imaging was negative, they would rather go home to follow-up with PCP and neurology team.  Patient chart review showed she had labs recently that were otherwise reassuring.  Patient family agreed with plan of care as was return precautions.  They had no questions or concerns and patient was discharged in good condition.           Final Clinical Impressions(s) / ED Diagnoses   Final diagnoses:  Fall, initial encounter  Bruising  Right-sided chest wall pain  Injury of left upper arm, initial encounter    ED Discharge Orders    None      Clinical Impression: 1. Fall, initial encounter   2. Bruising   3. Right-sided chest wall pain   4. Injury of left upper arm, initial encounter     Disposition: Discharge  Condition: Good  I have discussed the results, Dx and Tx plan with the pt(& family if present). He/she/they expressed understanding and agree(s) with the plan. Discharge instructions discussed at great length. Strict return precautions discussed and pt &/or family have verbalized understanding of the instructions. No further questions at time of discharge.    New Prescriptions   No medications on file    Follow Up: Mosie Lukes, MD [address] [place] [address] [phone]     Your neurologist        , Gwenyth Allegra, MD 08/25/19 [phone]

## 2019-08-25 NOTE — Discharge Instructions (Signed)
Her imaging today showed no evidence of rib fractures, pulmonary contusion, or collapsed lung.  Her exam was overall reassuring.  We suspect she sustained some soft tissue injuries from her fall.  As her blood work recently was reassuring and she otherwise looked well, our shared decision-making conversation led [place] to hold on further imaging and work-up and instead have her follow-up with her physical therapy team, PCP, and neurology team.  If any symptoms change or worsen, please return to the nearest emergency department.

## 2019-08-26 DIAGNOSIS — M25562 Pain in left knee: Secondary | ICD-10-CM | POA: Diagnosis not present

## 2019-08-26 DIAGNOSIS — R296 Repeated falls: Secondary | ICD-10-CM | POA: Diagnosis not present

## 2019-08-26 DIAGNOSIS — M25561 Pain in right knee: Secondary | ICD-10-CM | POA: Diagnosis not present

## 2019-09-01 DIAGNOSIS — R296 Repeated falls: Secondary | ICD-10-CM | POA: Diagnosis not present

## 2019-09-01 DIAGNOSIS — M25561 Pain in right knee: Secondary | ICD-10-CM | POA: Diagnosis not present

## 2019-09-01 DIAGNOSIS — M25562 Pain in left knee: Secondary | ICD-10-CM | POA: Diagnosis not present

## 2019-09-02 ENCOUNTER — Other Ambulatory Visit: Payer: Self-pay | Admitting: Family Medicine

## 2019-09-02 ENCOUNTER — Other Ambulatory Visit (INDEPENDENT_AMBULATORY_CARE_PROVIDER_SITE_OTHER): Payer: Medicare Other

## 2019-09-02 DIAGNOSIS — D649 Anemia, unspecified: Secondary | ICD-10-CM

## 2019-09-03 DIAGNOSIS — M25562 Pain in left knee: Secondary | ICD-10-CM | POA: Diagnosis not present

## 2019-09-03 DIAGNOSIS — M25561 Pain in right knee: Secondary | ICD-10-CM | POA: Diagnosis not present

## 2019-09-03 DIAGNOSIS — R296 Repeated falls: Secondary | ICD-10-CM | POA: Diagnosis not present

## 2019-09-04 LAB — FECAL OCCULT BLOOD, IMMUNOCHEMICAL: Fecal Occult Bld: NEGATIVE

## 2019-09-07 ENCOUNTER — Other Ambulatory Visit: Payer: Self-pay | Admitting: Family Medicine

## 2019-09-08 DIAGNOSIS — R296 Repeated falls: Secondary | ICD-10-CM | POA: Diagnosis not present

## 2019-09-08 DIAGNOSIS — M25562 Pain in left knee: Secondary | ICD-10-CM | POA: Diagnosis not present

## 2019-09-08 DIAGNOSIS — M25561 Pain in right knee: Secondary | ICD-10-CM | POA: Diagnosis not present

## 2019-09-10 DIAGNOSIS — R296 Repeated falls: Secondary | ICD-10-CM | POA: Diagnosis not present

## 2019-09-10 DIAGNOSIS — M25561 Pain in right knee: Secondary | ICD-10-CM | POA: Diagnosis not present

## 2019-09-10 DIAGNOSIS — M25562 Pain in left knee: Secondary | ICD-10-CM | POA: Diagnosis not present

## 2019-09-15 DIAGNOSIS — M25562 Pain in left knee: Secondary | ICD-10-CM | POA: Diagnosis not present

## 2019-09-15 DIAGNOSIS — R296 Repeated falls: Secondary | ICD-10-CM | POA: Diagnosis not present

## 2019-09-15 DIAGNOSIS — M25561 Pain in right knee: Secondary | ICD-10-CM | POA: Diagnosis not present

## 2019-09-23 DIAGNOSIS — M25561 Pain in right knee: Secondary | ICD-10-CM | POA: Diagnosis not present

## 2019-09-23 DIAGNOSIS — M25562 Pain in left knee: Secondary | ICD-10-CM | POA: Diagnosis not present

## 2019-09-23 DIAGNOSIS — R296 Repeated falls: Secondary | ICD-10-CM | POA: Diagnosis not present

## 2019-10-01 DIAGNOSIS — M25561 Pain in right knee: Secondary | ICD-10-CM | POA: Diagnosis not present

## 2019-10-01 DIAGNOSIS — R296 Repeated falls: Secondary | ICD-10-CM | POA: Diagnosis not present

## 2019-10-01 DIAGNOSIS — M25562 Pain in left knee: Secondary | ICD-10-CM | POA: Diagnosis not present

## 2019-10-07 ENCOUNTER — Emergency Department (HOSPITAL_BASED_OUTPATIENT_CLINIC_OR_DEPARTMENT_OTHER): Payer: Medicare Other

## 2019-10-07 ENCOUNTER — Other Ambulatory Visit: Payer: Self-pay

## 2019-10-07 ENCOUNTER — Encounter (HOSPITAL_BASED_OUTPATIENT_CLINIC_OR_DEPARTMENT_OTHER): Payer: Self-pay | Admitting: *Deleted

## 2019-10-07 ENCOUNTER — Encounter: Payer: Self-pay | Admitting: Family Medicine

## 2019-10-07 ENCOUNTER — Emergency Department (HOSPITAL_BASED_OUTPATIENT_CLINIC_OR_DEPARTMENT_OTHER)
Admission: EM | Admit: 2019-10-07 | Discharge: 2019-10-08 | Disposition: A | Discharge status: Home / Self Care | Payer: Medicare Other | Attending: Emergency Medicine | Admitting: Emergency Medicine

## 2019-10-07 DIAGNOSIS — R519 Headache, unspecified: Secondary | ICD-10-CM | POA: Diagnosis not present

## 2019-10-07 DIAGNOSIS — Z853 Personal history of malignant neoplasm of breast: Secondary | ICD-10-CM | POA: Insufficient documentation

## 2019-10-07 DIAGNOSIS — I10 Essential (primary) hypertension: Secondary | ICD-10-CM | POA: Diagnosis not present

## 2019-10-07 DIAGNOSIS — M4802 Spinal stenosis, cervical region: Secondary | ICD-10-CM | POA: Diagnosis not present

## 2019-10-07 DIAGNOSIS — R2681 Unsteadiness on feet: Secondary | ICD-10-CM | POA: Diagnosis not present

## 2019-10-07 DIAGNOSIS — Z79899 Other long term (current) drug therapy: Secondary | ICD-10-CM | POA: Diagnosis not present

## 2019-10-07 DIAGNOSIS — R531 Weakness: Secondary | ICD-10-CM

## 2019-10-07 DIAGNOSIS — R4701 Aphasia: Secondary | ICD-10-CM | POA: Diagnosis not present

## 2019-10-07 DIAGNOSIS — R479 Unspecified speech disturbances: Secondary | ICD-10-CM | POA: Diagnosis not present

## 2019-10-07 DIAGNOSIS — Z7982 Long term (current) use of aspirin: Secondary | ICD-10-CM | POA: Diagnosis not present

## 2019-10-07 DIAGNOSIS — R000 Tachycardia, unspecified: Secondary | ICD-10-CM | POA: Diagnosis not present

## 2019-10-07 DIAGNOSIS — Z8673 Personal history of transient ischemic attack (TIA), and cerebral infarction without residual deficits: Secondary | ICD-10-CM | POA: Diagnosis not present

## 2019-10-07 DIAGNOSIS — R4781 Slurred speech: Secondary | ICD-10-CM | POA: Diagnosis not present

## 2019-10-07 DIAGNOSIS — I6522 Occlusion and stenosis of left carotid artery: Secondary | ICD-10-CM | POA: Diagnosis not present

## 2019-10-07 LAB — COMPREHENSIVE METABOLIC PANEL
ALT: 26 U/L (ref 0–44)
AST: 27 U/L (ref 15–41)
Albumin: 4 g/dL (ref 3.5–5.0)
Alkaline Phosphatase: 92 U/L (ref 38–126)
Anion gap: 8 (ref 5–15)
BUN: 10 mg/dL (ref 8–23)
CO2: 27 mmol/L (ref 22–32)
Calcium: 10 mg/dL (ref 8.9–10.3)
Chloride: 104 mmol/L (ref 98–111)
Creatinine, Ser: 1.18 mg/dL — ABNORMAL HIGH (ref 0.44–1.00)
GFR calc Af Amer: 56 mL/min — ABNORMAL LOW (ref >60)
GFR calc non Af Amer: 49 mL/min — ABNORMAL LOW (ref >60)
Glucose, Bld: 112 mg/dL — ABNORMAL HIGH (ref 70–99)
Potassium: 4 mmol/L (ref 3.5–5.1)
Sodium: 139 mmol/L (ref 135–145)
Total Bilirubin: 0.5 mg/dL (ref 0.3–1.2)
Total Protein: 7.6 g/dL (ref 6.5–8.1)

## 2019-10-07 LAB — CBC WITH DIFFERENTIAL/PLATELET
Abs Immature Granulocytes: 0.02 10*3/uL (ref 0.00–0.07)
Basophils Absolute: 0 10*3/uL (ref 0.0–0.1)
Basophils Relative: 0 %
Eosinophils Absolute: 0.1 10*3/uL (ref 0.0–0.5)
Eosinophils Relative: 1 %
HCT: 37.3 % (ref 36.0–46.0)
Hemoglobin: 11.8 g/dL — ABNORMAL LOW (ref 12.0–15.0)
Immature Granulocytes: 0 %
Lymphocytes Relative: 49 %
Lymphs Abs: 3 10*3/uL (ref 0.7–4.0)
MCH: 27.8 pg (ref 26.0–34.0)
MCHC: 31.6 g/dL (ref 30.0–36.0)
MCV: 87.8 fL (ref 80.0–100.0)
Monocytes Absolute: 0.5 10*3/uL (ref 0.1–1.0)
Monocytes Relative: 8 %
Neutro Abs: 2.7 10*3/uL (ref 1.7–7.7)
Neutrophils Relative %: 42 %
Platelets: 186 10*3/uL (ref 150–400)
RBC: 4.25 MIL/uL (ref 3.87–5.11)
RDW: 14.9 % (ref 11.5–15.5)
WBC: 6.4 10*3/uL (ref 4.0–10.5)
nRBC: 0 % (ref 0.0–0.2)

## 2019-10-07 MED ORDER — IOHEXOL 350 MG/ML SOLN
100.0000 mL | Freq: Once | INTRAVENOUS | Status: AC | PRN
  Administered 2019-10-07: 100 mL via INTRAVENOUS

## 2019-10-07 NOTE — ED Provider Notes (Signed)
  Physical Exam  BP (!) 138/102   Pulse 76   Temp 98.4 F (36.9 C) (Oral)   Resp 14   Ht 5\' 2"  (1.575 m)   Wt 79.8 kg   LMP 01/20/2013   SpO2 99%   BMI 32.19 kg/m   Physical Exam  ED Course/Procedures     Procedures  MDM  Patient arrived from [facility] for MRI imaging.  She has had associated headaches, new neurological complaint, there is dysarthria.  According to the husband patient is unable to gather her thoughts, she is constantly repetitive speech pattern, she certainly forgets where she had just stated.  Case was discussed with Neurology Dr. Leonel Ramsay who recommended imaging.  MRI brain and MRI cervical spine have been ordered, patient is waiting CT.  She reports no pain at this time, vitals are within normal limits.  BP 129/86   Pulse 83   Temp 98.4 F (36.9 C) (Oral)   Resp 19   Ht 5\' 2"  (1.575 m)   Wt 79.8 kg   LMP 01/20/2013   SpO2 97%   BMI 32.19 kg/m   MRI 1. Advanced cervical disc and endplate degeneration with multilevel  mild spinal stenosis.    2. Up to mild associated spinal cord mass effect, and there is  subsequent Cervical Cord Myelomalacia suspected - most pronounced at  C6-C7.  3. Associated moderate or severe neural foraminal stenosis at the  right C4, left C5, bilateral C6 and C7 nerve levels.   Call placed to Dr. Leonel Ramsay for further recs.  Spoke to Dr. Leonel Ramsay of neurology who recommended neurosurgery consultation as mass-effect is compressing part of the spinal cord.  03:00AM spoke to [facility] neurosurgery PA, who will evaluate patient in the ED pending disposition.  According to our surgery PA, patient is to follow-up outpatient on Thursday at 10 AM.  She will be provided with information for his office.  Patient otherwise with stable vital signs, stable for discharge in the company of her husband.  Portions of this note were generated with [employer]. Dictation errors may occur despite best attempts at proofreading.      Janeece Fitting, PA-C 10/08/19 [id number]    Orpah Greek, MD 10/08/19 [phone]

## 2019-10-07 NOTE — ED Triage Notes (Addendum)
Off balanced x 2 weeks. Weakness in both hands causing her to drop things since yesterday. Slight headache today. Speech is clear. She is ambulatory.  Her symptoms started after started Hydroxyzine for depression. She was told to stop the medication. She has been off the medication since yesterday. She felt like she was feeling better today until her hands were weak.

## 2019-10-07 NOTE — ED Notes (Signed)
Family at bedside. 

## 2019-10-07 NOTE — ED Provider Notes (Signed)
[facility] EMERGENCY DEPARTMENT Provider Note   CSN: [id number] Arrival date & time: 10/07/19  1712     History   Chief Complaint Chief Complaint  Patient presents with  . Weakness    HPI Kristi [name] is a 63 y.o. female.     The history is provided by the patient and medical records. Kristi [name] language interpreter was used.  Neurologic Problem This is a new problem. The current episode started more than 2 days ago. The problem occurs constantly. The problem has not changed since onset.Associated symptoms include headaches (occasional). Pertinent negatives include Kristi [name] chest pain, Kristi [name] abdominal pain and Kristi [name] shortness of breath. Nothing aggravates the symptoms. Nothing relieves the symptoms. She has tried nothing for the symptoms. The treatment provided Kristi [name] relief.    Past Medical History:  Diagnosis Date  . Anemia    Iron deficinecy anemia  . Anemia 05/14/2017  . Anxiety   . Anxiety and depression 05/15/2014  . Arthritis   . Breast cancer ([place])    left ,last radiation 2'15, last chemo 8'14  . Constipation 11/13/2016  . Depression   . History of chicken pox   . History of radiation therapy 09/09/13-10/28/13   45 gray to left breast, lumpectomy cavity boosted to 63 gray  . HTN (hypertension) 11/13/2016  . Hyperglycemia 01/09/2016  . Hyperlipidemia 05/14/2017  . Hypertension   . MRSA (methicillin resistant Staphylococcus aureus) 2009   right groin area-Kristi [name] issues now. 04-07-14 PCR screen negative today.  . Neuropathy   . Preventative health care 11/13/2016    Patient Active Problem List   Diagnosis Date Noted  . Mild cognitive impairment 08/24/2019  . Genetic testing 02/20/2019  . S/P mastectomy, right 12/26/2018  . Left upper extremity numbness 06/19/2018  . Cervical cancer screening 06/18/2018  . Anemia 05/14/2017  . Hyperlipidemia 05/14/2017  . History of left breast cancer 02/15/2017  . Nodule of finger of left hand 11/14/2016  . Right hip pain 11/14/2016  . Knee pain,  bilateral 11/14/2016  . Constipation 11/13/2016  . Preventative health care 11/13/2016  . HTN (hypertension) 11/13/2016  . Hyperglycemia 01/09/2016  . Neuropathy of hand, left 09/21/2015  . History of chicken pox   . Shingles 05/15/2014  . Anxiety and depression 05/15/2014  . Rotator cuff tear 02/24/2014  . Left shoulder pain with abduction 12/12/2013  . CVA (cerebral infarction) 07/18/2013  . TIA (transient ischemic attack) 07/18/2013  . Contracture of axilla 05/20/2013  . Primary cancer of upper inner quadrant of left female breast ([place]) 01/24/2013  . Anal fissure 05/03/2011  . Hemorrhoids, internal, with prolapse 05/03/2011    Past Surgical History:  Procedure Laterality Date  . ANAL SPHINCTEROTOMY  04/2011  . APPENDECTOMY  1980  . AXILLARY LYMPH NODE DISSECTION Left 02/04/2013   Procedure: LEFT AXILLARY LYMPH NODE DISSECTION;  Surgeon: Stark Klein, MD;  Location: [facility];  Service: General;  Laterality: Left;  End: [id number]  . BREAST LUMPECTOMY WITH NEEDLE LOCALIZATION Left 02/04/2013   Procedure: LEFT BREAST LUMPECTOMY WITH NEEDLE LOCALIZATION;  Surgeon: Stark Klein, MD;  Location: [facility];  Service: General;  Laterality: Left;  . BREAST SURGERY     Lumpectomy in april 2014  . HEMORRHOID SURGERY  04/2011   ligation  . MASTECTOMY Left 02/15/2017  . PORT-A-CATH REMOVAL N/A 04/16/2014   Procedure: REMOVAL PORT-A-CATH;  Surgeon: Stark Klein, MD;  Location: [facility];  Service: General;  Laterality: N/A;  . PORTACATH PLACEMENT Right 02/04/2013   Procedure: INSERTION PORT-A-CATH;  Surgeon: Stark Klein, MD;  Location: [facility];  Service: General;  Laterality: Right;  Start [id number].  Marland Kitchen SHOULDER ARTHROSCOPY WITH ROTATOR CUFF REPAIR AND SUBACROMIAL DECOMPRESSION Left 02/24/2014   Procedure: SHOULDER ARTHROSCOPY WITH ROTATOR CUFF REPAIR AND SUBACROMIAL DECOMPRESSION;  Surgeon: Meredith Pel, MD;  Location: [facility];  Service: Orthopedics;  Laterality: Left;  LEFT SHOULDER DIAGNOSTIC OPERATIVE  ARTHROSCOPY, SUBACROMIAL DECOMPRESSION, ROTATOR CUFF TEAR REPAIR  . SIMPLE MASTECTOMY WITH AXILLARY SENTINEL NODE BIOPSY Left 02/15/2017   Procedure: LEFT MASTECTOMY;  Surgeon: Stark Klein, MD;  Location: [facility];  Service: General;  Laterality: Left;  . TOTAL MASTECTOMY Right 12/26/2018   Procedure: RIGHT BREAST PROPHYLATIC MASTECTOMY;  Surgeon: Stark Klein, MD;  Location: [facility];  Service: General;  Laterality: Right;     OB History   Kristi [name] obstetric history on file.      Home Medications    Prior to Admission medications   Medication Sig Start Date End Date Taking? Authorizing Provider  amitriptyline (ELAVIL) 100 MG tablet Take 100 mg by mouth at bedtime. 11/16/16   [provider]  anastrozole (ARIMIDEX) 1 MG tablet Take 1 tablet (1 mg total) by mouth daily. TAKE 1 TABLET (1 MG) BY MOUTH DAILY IN THE MORNING 05/22/19   Nicholas Lose, MD  aspirin EC 81 MG tablet Take 1 tablet (81 mg total) by mouth daily. 06/19/19   Mosie Lukes, MD  B Complex-C (SUPER B COMPLEX PO) Take 1 tablet by mouth daily.    [provider]  clonazePAM (KLONOPIN) 0.5 MG tablet Take 1 tablet (0.5 mg total) by mouth 2 (two) times daily as needed for anxiety. 08/21/19   Mosie Lukes, MD  gabapentin (NEURONTIN) 800 MG tablet TAKE 1 TABLET BY MOUTH THREE TIMES A DAY 01/29/19   Nicholas Lose, MD  hydrOXYzine (VISTARIL) 25 MG capsule Take 25 mg by mouth 3 (three) times daily.  07/15/17   [provider]  KLOR-CON M20 20 MEQ tablet TAKE 1 TABLET BY MOUTH EVERY DAY 05/16/19   Mosie Lukes, MD  LATUDA 80 MG TABS tablet Take 80 mg by mouth at bedtime.  07/17/17   [provider]  lidocaine-prilocaine (EMLA) cream APPLY EXTERNALLY TO THE AFFECTED AREA TWICE DAILY AS DIRECTED 06/19/19   Mosie Lukes, MD  meloxicam (MOBIC) 7.5 MG tablet TAKE 1 TABLET BY MOUTH DAILY AS NEEDED FOR PAIN 09/08/19   Mosie Lukes, MD  Multiple Vitamin (MULTIVITAMIN WITH MINERALS) TABS tablet Take 1 tablet by  mouth daily.    [provider]  valsartan-hydrochlorothiazide (DIOVAN-HCT) 320-25 MG tablet TAKE 1 TABLET BY MOUTH EVERY DAY IN THE MORNING 07/31/19   Mosie Lukes, MD  prochlorperazine (COMPAZINE) 10 MG tablet Take 1 tablet (10 mg total) by mouth every 6 (six) hours as needed (Nausea or vomiting). 05/07/13 07/24/13  Gardenia Phlegm, NP  prochlorperazine (COMPAZINE) 25 MG suppository Place 1 suppository (25 mg total) rectally every 12 (twelve) hours as needed for nausea. 05/07/13 07/24/13  Gardenia Phlegm, NP    Family History Family History  Problem Relation Age of Onset  . Lung cancer Father   . Hypertension Father   . Thyroid cancer Father        dx in his 67s  . Cancer Father        lung, thyroid, smoker  . Breast cancer Paternal Aunt 63  . Colon cancer Paternal Aunt        dx in her 50x  . Cervical  cancer Paternal Aunt        dzx in her 68s  . Ovarian cancer Cousin        dx in her lage 2s  . Breast cancer Cousin        maternal first cousin, once removed; dx in her late 43s  . Breast cancer Cousin        maternal first cousin once removed; dx in late 18s  . Hypertension Mother   . Diabetes Mother   . Dementia Mother   . Hypertension Brother   . Seizures Brother        Alcohol induced.  . Alcohol abuse Brother        drinker, smoker  . Cancer Paternal Uncle        oral cancer  . Kidney cancer Paternal Grandmother   . Arthritis Daughter        back surgery  . Cancer Cousin        several paternal cousins with brain cancer, leukemia, and other cancers  . Cancer Sister        stomach    Social History Social History   Tobacco Use  . Smoking status: Never Smoker  . Smokeless tobacco: Never Used  Substance Use Topics  . Alcohol use: Kristi [name]  . Drug use: Kristi [name]     Allergies   Patient has Kristi [name] known allergies.   Review of Systems Review of Systems  Constitutional: Negative for chills, diaphoresis, fatigue and fever.  HENT: Negative for  congestion.   Eyes: Negative for visual disturbance.  Respiratory: Negative for cough, chest tightness, shortness of breath and wheezing.   Cardiovascular: Negative for chest pain.  Gastrointestinal: Negative for abdominal pain, constipation, diarrhea, nausea and vomiting.  Genitourinary: Negative for dysuria.  Musculoskeletal: Negative for back pain, neck pain and neck stiffness.  Neurological: Positive for dizziness, speech difficulty, weakness and headaches (occasional). Negative for facial asymmetry, light-headedness and numbness.  Psychiatric/Behavioral: Negative for agitation.  All other systems reviewed and are negative.    Physical Exam Updated Vital Signs BP (!) 136/95   Pulse (!) 106   Temp 98.4 F (36.9 C) (Oral)   Resp 18   Ht 5\' 2"  (1.575 m)   Wt 79.8 kg   LMP 01/20/2013   SpO2 98%   BMI 32.19 kg/m   Physical Exam Vitals signs and nursing note reviewed.  Constitutional:      General: She is not in acute distress.    Appearance: She is well-developed. She is not ill-appearing, toxic-appearing or diaphoretic.  HENT:     Head: Normocephalic and atraumatic.     Right Ear: External ear normal.     Left Ear: External ear normal.     Nose: Nose normal. Kristi [name] congestion or rhinorrhea.     Mouth/Throat:     Mouth: Mucous membranes are moist.     Pharynx: Kristi [name] oropharyngeal exudate or posterior oropharyngeal erythema.  Eyes:     Extraocular Movements: Extraocular movements intact.     Conjunctiva/sclera: Conjunctivae normal.     Pupils: Pupils are equal, round, and reactive to light.  Neck:     Musculoskeletal: Normal range of motion and neck supple. Kristi [name] muscular tenderness.     Vascular: Carotid bruit (left) present.  Cardiovascular:     Pulses: Normal pulses.     Heart sounds: Kristi [name] murmur.  Pulmonary:     Effort: Kristi [name] respiratory distress.     Breath sounds: Kristi [name] stridor. Kristi [name] wheezing, rhonchi or rales.  Chest:  Chest wall: Kristi [name] tenderness.  Abdominal:     General:  There is Kristi [name] distension.     Tenderness: There is Kristi [name] abdominal tenderness. There is Kristi [name] right CVA tenderness, left CVA tenderness or rebound.  Musculoskeletal:        General: Kristi [name] tenderness.  Skin:    General: Skin is warm.     Findings: Kristi [name] erythema or rash.  Neurological:     Mental Status: She is alert and oriented to person, place, and time.     Cranial Nerves: Kristi [name] cranial nerve deficit or dysarthria.     Sensory: Sensation is intact. Kristi [name] sensory deficit.     Motor: Kristi [name] weakness, tremor or abnormal muscle tone.     Coordination: Coordination normal. Finger-Nose-Finger Test and Heel to [facility] Test normal.     Deep Tendon Reflexes: Reflexes are normal and symmetric.     Comments: Patient had normal extraocular movements and pupil exam.  Patient had expressive aphasia on my exam and could not get full sentences out clearly.  Kristi [name] dysarthria.  Patient had normal finger-nose-finger testing and heel shin bilaterally.  Normal grip strength and sensation on my exam.  She reports the bilateral hand grip strength weakness is transient.  She reports she feels that she is still dizzy and unsteady and feels like she is on a boat.  Kristi [name] vision changes.  Kristi [name] other focal neurologic deficit seen.      ED Treatments / Results  Labs (all labs ordered are listed, but only abnormal results are displayed) Labs Reviewed  CBC WITH DIFFERENTIAL/PLATELET - Abnormal; Notable for the following components:      Result Value   Hemoglobin 11.8 (*)    All other components within normal limits  COMPREHENSIVE METABOLIC PANEL - Abnormal; Notable for the following components:   Glucose, Bld 112 (*)    Creatinine, Ser 1.18 (*)    GFR calc non Af Amer 49 (*)    GFR calc Af Amer 56 (*)    All other components within normal limits    EKG EKG Interpretation  Date/Time:  Tuesday October 07 2019 17:28:48 EST Ventricular Rate:  101 PR Interval:  162 QRS Duration: 88 QT Interval:  340 QTC Calculation: 440 R Axis:   74 Text  Interpretation: Sinus tachycardia Minimal voltage criteria for LVH, may be normal variant ( Sokolow-Lyon ) Borderline ECG When compared to prior, faster rate and larger S wave in leads V3-V4. Kristi [name] STEMI Confirmed by Antony Blackbird [phone]) on 10/07/2019 6:12:24 PM   Radiology Ct Angio Head W Or Wo Contrast  Result Date: 10/07/2019 CLINICAL DATA:  63 year old female with abnormal speech, off balance for 2 weeks, bilateral hand weakness. Slight headache. Left-side bruit on exam. EXAM: CT ANGIOGRAPHY HEAD AND NECK TECHNIQUE: Multidetector CT imaging of the head and neck was performed using the standard protocol during bolus administration of intravenous contrast. Multiplanar CT image reconstructions and MIPs were obtained to evaluate the vascular anatomy. Carotid stenosis measurements (when applicable) are obtained utilizing NASCET criteria, using the distal internal carotid diameter as the denominator. CONTRAST:  154mL OMNIPAQUE IOHEXOL 350 MG/ML SOLN COMPARISON:  Head CT without contrast 07/18/2013. Brain MRI and intracranial MRA 07/19/2013. FINDINGS: CT HEAD Brain: Cerebral volume remains normal. Kristi [name] midline shift, ventriculomegaly, mass effect, evidence of mass lesion, intracranial hemorrhage or evidence of cortically based acute infarction. Best seen on coronal image 32 there is a subtle 3 millimeter area of asymmetric hyperdensity along the junction of the right middle and superior frontal gyri. This  is also on sagittal image 28, where it appears more linear. Kristi [name] regional edema or mass effect. This was not apparent on the 2014 comparisons. Elsewhere gray-white matter differentiation is within normal limits throughout the brain. Calvarium and skull base: Negative. Paranasal sinuses: Visualized paranasal sinuses and mastoids are clear. Orbits: Visualized orbits and scalp soft tissues are within normal limits. CTA NECK Skeleton: Reversal of cervical lordosis associated with advanced lower cervical disc and endplate  degeneration. Kristi [name] acute osseous abnormality identified. Upper chest: Negative. Other neck: Negative. Aortic arch: 3 vessel arch configuration with minimal arch atherosclerosis. Right carotid system: Streak artifact at the right thoracic inlet due to dense subclavian vein contrast. Brachiocephalic artery and proximal right CCA appear normal. Negative right carotid bifurcation and cervical right ICA. Left carotid system: Negative left CCA origin. Mildly tortuous proximal left CCA. At the left carotid bifurcation the left ICA origin and bulb appear normal and widely patent but there is soft atherosclerotic stenosis of the ECA origin (series 13, image 129). Negative cervical left ICA. Vertebral arteries: Proximal right subclavian artery and right vertebral artery origin appear normal. The right vertebral artery is patent to the skull base without stenosis. Proximal left subclavian artery and left vertebral artery origin are normal. Tortuous left V1 segment. Patent left vertebral artery to the skull base without stenosis. CTA HEAD Posterior circulation: Early takeoff of both PICA origins which are otherwise normal. Mildly dominant distal right vertebral artery. Kristi [name] V4 segment plaque or stenosis. Patent vertebrobasilar junction and basilar artery. Normal SCA and PCA origins. Posterior communicating arteries are diminutive or absent. Bilateral PCA branches are within normal limits. Anterior circulation: Both ICA siphons are patent. On the left there is minimal calcified plaque without stenosis. Mild calcified plaque on the right without stenosis. Patent carotid termini. Normal MCA and ACA origins. Anterior communicating artery and bilateral ACA branches are within normal limits. Left MCA M1 segment and bifurcation are patent without stenosis. Left MCA branches are within normal limits. Right MCA M1 segment has an early trifurcation without stenosis. Right MCA branches are within normal limits. There is Kristi [name] abnormal  vascularity identified in the right middle frontal gyrus area to correspond to the subtle plain CT hyperdensity on series 7, image 32. Venous sinuses: Patent. Anatomic variants: None. Review of the MIP images confirms the above findings IMPRESSION: 1. Essentially normal for age CTA head and neck; there is soft plaque at the Left ECA origin but minimal plaque otherwise and Kristi [name] arterial stenosis. 2. Subtle asymmetric hyperdensity on the non-contrast Head CT in the right frontal lobe subcortical white matter (series 7, image 32). Kristi [name] associated edema or mass effect, and Kristi [name] correlation on today's CTA, or the 2014 MRI. As such this may be most likely artifact. A Brain MRI without and with contrast would be necessary to further characterize this finding. 3. Otherwise negative noncontrast head CT. 4. Advanced cervical spine degeneration. Electronically Signed   By: Genevie Ann M.D.   On: 10/07/2019 20:27   Ct Angio Neck W And/or Wo Contrast  Result Date: 10/07/2019 CLINICAL DATA:  63 year old female with abnormal speech, off balance for 2 weeks, bilateral hand weakness. Slight headache. Left-side bruit on exam. EXAM: CT ANGIOGRAPHY HEAD AND NECK TECHNIQUE: Multidetector CT imaging of the head and neck was performed using the standard protocol during bolus administration of intravenous contrast. Multiplanar CT image reconstructions and MIPs were obtained to evaluate the vascular anatomy. Carotid stenosis measurements (when applicable) are obtained utilizing NASCET criteria, using the distal internal carotid  diameter as the denominator. CONTRAST:  161mL OMNIPAQUE IOHEXOL 350 MG/ML SOLN COMPARISON:  Head CT without contrast 07/18/2013. Brain MRI and intracranial MRA 07/19/2013. FINDINGS: CT HEAD Brain: Cerebral volume remains normal. Kristi [name] midline shift, ventriculomegaly, mass effect, evidence of mass lesion, intracranial hemorrhage or evidence of cortically based acute infarction. Best seen on coronal image 32 there is a subtle 3  millimeter area of asymmetric hyperdensity along the junction of the right middle and superior frontal gyri. This is also on sagittal image 28, where it appears more linear. Kristi [name] regional edema or mass effect. This was not apparent on the 2014 comparisons. Elsewhere gray-white matter differentiation is within normal limits throughout the brain. Calvarium and skull base: Negative. Paranasal sinuses: Visualized paranasal sinuses and mastoids are clear. Orbits: Visualized orbits and scalp soft tissues are within normal limits. CTA NECK Skeleton: Reversal of cervical lordosis associated with advanced lower cervical disc and endplate degeneration. Kristi [name] acute osseous abnormality identified. Upper chest: Negative. Other neck: Negative. Aortic arch: 3 vessel arch configuration with minimal arch atherosclerosis. Right carotid system: Streak artifact at the right thoracic inlet due to dense subclavian vein contrast. Brachiocephalic artery and proximal right CCA appear normal. Negative right carotid bifurcation and cervical right ICA. Left carotid system: Negative left CCA origin. Mildly tortuous proximal left CCA. At the left carotid bifurcation the left ICA origin and bulb appear normal and widely patent but there is soft atherosclerotic stenosis of the ECA origin (series 13, image 129). Negative cervical left ICA. Vertebral arteries: Proximal right subclavian artery and right vertebral artery origin appear normal. The right vertebral artery is patent to the skull base without stenosis. Proximal left subclavian artery and left vertebral artery origin are normal. Tortuous left V1 segment. Patent left vertebral artery to the skull base without stenosis. CTA HEAD Posterior circulation: Early takeoff of both PICA origins which are otherwise normal. Mildly dominant distal right vertebral artery. Kristi [name] V4 segment plaque or stenosis. Patent vertebrobasilar junction and basilar artery. Normal SCA and PCA origins. Posterior communicating  arteries are diminutive or absent. Bilateral PCA branches are within normal limits. Anterior circulation: Both ICA siphons are patent. On the left there is minimal calcified plaque without stenosis. Mild calcified plaque on the right without stenosis. Patent carotid termini. Normal MCA and ACA origins. Anterior communicating artery and bilateral ACA branches are within normal limits. Left MCA M1 segment and bifurcation are patent without stenosis. Left MCA branches are within normal limits. Right MCA M1 segment has an early trifurcation without stenosis. Right MCA branches are within normal limits. There is Kristi [name] abnormal vascularity identified in the right middle frontal gyrus area to correspond to the subtle plain CT hyperdensity on series 7, image 32. Venous sinuses: Patent. Anatomic variants: None. Review of the MIP images confirms the above findings IMPRESSION: 1. Essentially normal for age CTA head and neck; there is soft plaque at the Left ECA origin but minimal plaque otherwise and Kristi [name] arterial stenosis. 2. Subtle asymmetric hyperdensity on the non-contrast Head CT in the right frontal lobe subcortical white matter (series 7, image 32). Kristi [name] associated edema or mass effect, and Kristi [name] correlation on today's CTA, or the 2014 MRI. As such this may be most likely artifact. A Brain MRI without and with contrast would be necessary to further characterize this finding. 3. Otherwise negative noncontrast head CT. 4. Advanced cervical spine degeneration. Electronically Signed   By: Genevie Ann M.D.   On: 10/07/2019 20:27    Procedures Procedures (including critical care time)  Medications Ordered in ED Medications  iohexol (OMNIPAQUE) 350 MG/ML injection 100 mL (100 mLs Intravenous Contrast Given 10/07/19 1951)     Initial Impression / Assessment and Plan / ED Course  I have reviewed the triage vital signs and the nursing notes.  Pertinent labs & imaging results that were available during my care of the patient were  reviewed by me and considered in my medical decision making (see chart for details).        NOEMIE STUFFLE is a 63 y.o. female with a past medical history significant for prior stroke, TIA, hypertension, breast cancer, anxiety, depression, chronic neuropathies, and arthritis who presents with 2 weeks of difficulty speaking, balance unsteadiness, and several days of bilateral hand weakness.  Patient reports that she has had many falls in the past related to her neuropathies but she is concerned that she had a fall last week that was not related to them.  She reports that for the last 2 weeks or so she has been having the feeling of unsteadiness like she is on a ship.  This has been intermittent but more sustained over the last few days.  She reports that she has also had bilateral hand weakness that comes and goes where she is dropping things with both hands at times.  She reports it is not constant.  She denies new numbness, tingling, or weakness in her legs.  She denies any numbness or tingling in her arms associated with weakness.  She does report some mild headaches that are currently resolved.  She reports that she is having difficulty getting her words out for the last 2 weeks.  She reports this is new.  She denies fevers, chills, congestion, cough, nausea, vomiting, urinary symptoms or GI symptoms.  She denies any Covid exposures.  I saw the patient several months ago when she had a fall injuring her right chest but did not have any abnormalities on initial chest x-ray.  On exam, patient had normal grip strength and sensation bilaterally.  Negative Hoffmann's in both hands.  Normal finger-nose-finger testing and normal heel shin on both legs.  Normal strength in both legs.  Speech was slightly aphasic as she was having to stop and start and could not get what she wanted Foley out.  I did not hear dysarthria.  Pupils are symmetric and reactive and there is Kristi [name] facial droop.  Normal facial sensation.   Neck was nontender.  Possible faint bruit on the left neck.  Exam otherwise unremarkable.  We will touch base with neurology to discuss imaging however I am concerned about stroke with the expressive aphasia, unsteadiness, and the intermittent bilateral arm weakness.  Due to the possible bruit I feel I auscultated, patient may require vascular imaging to evaluate however patient may need MRI tonight requiring transfer.  Will discuss with neurology.  Spoke with Dr. Leonel Ramsay with neurology who recommended CTA head and neck at this facility and then transfer for MRI of the head and neck to look for other etiology such as stroke as the cause of the constellation of symptoms.  Patient is agreeable this plan and will be transferred after CTA is finished.  8:49 PM CT imaging just returned and it did show a possible hyperdensity in the right brain.  MRI with and without contrast was recommended by radiology.  I confirm with neurology to order MRI with and without of the head and neck.  Patient will be transferred to [facility] for MRI tonight and will  determine disposition after MRI is completed.  Anticipate touching base with neurology after MRIs are completed.  Spoke with Dr. Rex Kras who accepted patient in transfer for MRI tonight at [facility].  Final Clinical Impressions(s) / ED Diagnoses   Final diagnoses:  Weakness  Aphasia  Unsteadiness    Clinical Impression: 1. Weakness   2. Aphasia   3. Unsteadiness     Disposition: Transfer to [facility] for MRI of the head and neck to further evaluate the patient's bilateral hand weakness that is transient, the aphasia, and the unsteadiness.  Anticipate disposition after MRIs are completed.  This note was prepared with assistance of Systems analyst. Occasional wrong-word or sound-a-like substitutions may have occurred due to the inherent limitations of voice recognition software.     , Gwenyth Allegra,  MD 10/07/19 [id number]

## 2019-10-08 ENCOUNTER — Emergency Department (HOSPITAL_COMMUNITY): Payer: Medicare Other

## 2019-10-08 DIAGNOSIS — R471 Dysarthria and anarthria: Secondary | ICD-10-CM | POA: Diagnosis not present

## 2019-10-08 DIAGNOSIS — M4802 Spinal stenosis, cervical region: Secondary | ICD-10-CM | POA: Diagnosis not present

## 2019-10-08 DIAGNOSIS — R519 Headache, unspecified: Secondary | ICD-10-CM | POA: Diagnosis not present

## 2019-10-08 DIAGNOSIS — R4781 Slurred speech: Secondary | ICD-10-CM | POA: Diagnosis not present

## 2019-10-08 MED ORDER — GADOBUTROL 1 MMOL/ML IV SOLN
7.5000 mL | Freq: Once | INTRAVENOUS | Status: AC | PRN
  Administered 2019-10-08: 7.5 mL via INTRAVENOUS

## 2019-10-08 MED ORDER — LORAZEPAM 2 MG/ML IJ SOLN
0.5000 mg | Freq: Once | INTRAMUSCULAR | Status: AC
  Administered 2019-10-08: 0.5 mg via INTRAVENOUS
  Filled 2019-10-08: qty 1

## 2019-10-08 NOTE — ED Notes (Signed)
Family at bedside. 

## 2019-10-08 NOTE — Consult Note (Signed)
Chief Complaint   Chief Complaint  Patient presents with  . Weakness    HPI   Consult requested by: EDP Reason for consult: Cervical stenosis   HPI: Kristi [name] is a 63 y.o. female with history of HTN, hyperlipidemia, anxiety, depression, CVA, TIA who initially presented to [facility] with transient neurologic symptoms including headache, N/T BUE, dysarthria, word finding difficulties, balance issues, cognitive issues (forgetting things easily) for the past several weeks. Symptoms are intermittent.  No pattern. Neurology was consulted by EDP and CTA head/neck and ultimately MRI were rec. Work up essentially benign with exception of mild cervical stenosis with cord mass effect noted on C spine MRI. NS consultation requested.  In discussion with patient, symptoms have been present for several weeks. She believes they may have started when she was rx hydroxyzine by her psychiatrist. She is currently asymptomatic. Denies neck pain, N/T in extremities, weakness, bowel/bladder dysfunction.   Patient Active Problem List   Diagnosis Date Noted  . Mild cognitive impairment 08/24/2019  . Genetic testing 02/20/2019  . S/P mastectomy, right 12/26/2018  . Left upper extremity numbness 06/19/2018  . Cervical cancer screening 06/18/2018  . Anemia 05/14/2017  . Hyperlipidemia 05/14/2017  . History of left breast cancer 02/15/2017  . Nodule of finger of left hand 11/14/2016  . Right hip pain 11/14/2016  . Knee pain, bilateral 11/14/2016  . Constipation 11/13/2016  . Preventative health care 11/13/2016  . HTN (hypertension) 11/13/2016  . Hyperglycemia 01/09/2016  . Neuropathy of hand, left 09/21/2015  . History of chicken pox   . Shingles 05/15/2014  . Anxiety and depression 05/15/2014  . Rotator cuff tear 02/24/2014  . Left shoulder pain with abduction 12/12/2013  . CVA (cerebral infarction) 07/18/2013  . TIA (transient ischemic attack) 07/18/2013  . Contracture of axilla  05/20/2013  . Primary cancer of upper inner quadrant of left female breast ([place]) 01/24/2013  . Anal fissure 05/03/2011  . Hemorrhoids, internal, with prolapse 05/03/2011    PMH: Past Medical History:  Diagnosis Date  . Anemia    Iron deficinecy anemia  . Anemia 05/14/2017  . Anxiety   . Anxiety and depression 05/15/2014  . Arthritis   . Breast cancer ([place])    left ,last radiation 2'15, last chemo 8'14  . Constipation 11/13/2016  . Depression   . History of chicken pox   . History of radiation therapy 09/09/13-10/28/13   45 gray to left breast, lumpectomy cavity boosted to 63 gray  . HTN (hypertension) 11/13/2016  . Hyperglycemia 01/09/2016  . Hyperlipidemia 05/14/2017  . Hypertension   . MRSA (methicillin resistant Staphylococcus aureus) 2009   right groin area-no issues now. 04-07-14 PCR screen negative today.  . Neuropathy   . Preventative health care 11/13/2016    PSH: Past Surgical History:  Procedure Laterality Date  . ANAL SPHINCTEROTOMY  04/2011  . APPENDECTOMY  1980  . AXILLARY LYMPH NODE DISSECTION Left 02/04/2013   Procedure: LEFT AXILLARY LYMPH NODE DISSECTION;  Surgeon: Stark Klein, MD;  Location: [facility];  Service: General;  Laterality: Left;  End: [id number]  . BREAST LUMPECTOMY WITH NEEDLE LOCALIZATION Left 02/04/2013   Procedure: LEFT BREAST LUMPECTOMY WITH NEEDLE LOCALIZATION;  Surgeon: Stark Klein, MD;  Location: [facility];  Service: General;  Laterality: Left;  . BREAST SURGERY     Lumpectomy in april 2014  . HEMORRHOID SURGERY  04/2011   ligation  . MASTECTOMY Left 02/15/2017  . PORT-A-CATH REMOVAL N/A 04/16/2014   Procedure: REMOVAL PORT-A-CATH;  Surgeon: Stark Klein, MD;  Location: [facility];  Service: General;  Laterality: N/A;  . PORTACATH PLACEMENT Right 02/04/2013   Procedure: INSERTION PORT-A-CATH;  Surgeon: Stark Klein, MD;  Location: [facility];  Service: General;  Laterality: Right;  Start TimeAC:[id number].  Marland Kitchen SHOULDER ARTHROSCOPY WITH ROTATOR CUFF REPAIR AND SUBACROMIAL  DECOMPRESSION Left 02/24/2014   Procedure: SHOULDER ARTHROSCOPY WITH ROTATOR CUFF REPAIR AND SUBACROMIAL DECOMPRESSION;  Surgeon: Meredith Pel, MD;  Location: [facility];  Service: Orthopedics;  Laterality: Left;  LEFT SHOULDER DIAGNOSTIC OPERATIVE ARTHROSCOPY, SUBACROMIAL DECOMPRESSION, ROTATOR CUFF TEAR REPAIR  . SIMPLE MASTECTOMY WITH AXILLARY SENTINEL NODE BIOPSY Left 02/15/2017   Procedure: LEFT MASTECTOMY;  Surgeon: Stark Klein, MD;  Location: [facility];  Service: General;  Laterality: Left;  . TOTAL MASTECTOMY Right 12/26/2018   Procedure: RIGHT BREAST PROPHYLATIC MASTECTOMY;  Surgeon: Stark Klein, MD;  Location: [facility];  Service: General;  Laterality: Right;    (Not in a hospital admission)   SH: Social History   Tobacco Use  . Smoking status: Never Smoker  . Smokeless tobacco: Never Used  Substance Use Topics  . Alcohol use: No  . Drug use: No    MEDS: Prior to Admission medications   Medication Sig Start Date End Date Taking? Authorizing Provider  amitriptyline (ELAVIL) 100 MG tablet Take 100 mg by mouth at bedtime. 11/16/16  Yes [provider]  anastrozole (ARIMIDEX) 1 MG tablet Take 1 tablet (1 mg total) by mouth daily. TAKE 1 TABLET (1 MG) BY MOUTH DAILY IN THE MORNING Patient taking differently: Take 1 mg by mouth daily.  05/22/19  Yes Nicholas Lose, MD  aspirin EC 81 MG tablet Take 1 tablet (81 mg total) by mouth daily. 06/19/19  Yes Mosie Lukes, MD  B Complex-C (SUPER B COMPLEX PO) Take 1 tablet by mouth daily.   Yes [provider]  clonazePAM (KLONOPIN) 0.5 MG tablet Take 1 tablet (0.5 mg total) by mouth 2 (two) times daily as needed for anxiety. 08/21/19  Yes Mosie Lukes, MD  hydrOXYzine (ATARAX/VISTARIL) 25 MG tablet Take 25 mg by mouth 2 (two) times daily as needed for anxiety.  10/01/19  Yes [provider]  KLOR-CON M20 20 MEQ tablet TAKE 1 TABLET BY MOUTH EVERY DAY Patient taking differently: Take 20 mEq by mouth daily.  05/16/19   Yes Mosie Lukes, MD  LATUDA 80 MG TABS tablet Take 80 mg by mouth at bedtime.  07/17/17  Yes [provider]  meloxicam (MOBIC) 7.5 MG tablet TAKE 1 TABLET BY MOUTH DAILY AS NEEDED FOR PAIN Patient taking differently: Take 7.5 mg by mouth daily as needed for pain.  09/08/19  Yes Mosie Lukes, MD  Multiple Vitamin (MULTIVITAMIN WITH MINERALS) TABS tablet Take 1 tablet by mouth daily.   Yes [provider]  valsartan-hydrochlorothiazide (DIOVAN-HCT) 320-25 MG tablet TAKE 1 TABLET BY MOUTH EVERY DAY IN THE MORNING Patient taking differently: Take 1 tablet by mouth daily.  07/31/19  Yes Mosie Lukes, MD  gabapentin (NEURONTIN) 800 MG tablet TAKE 1 TABLET BY MOUTH THREE TIMES A DAY Patient not taking: Reported on 10/08/2019 01/29/19   Nicholas Lose, MD  lidocaine-prilocaine (EMLA) cream APPLY EXTERNALLY TO THE AFFECTED AREA TWICE DAILY AS DIRECTED Patient not taking: Reported on 10/08/2019 06/19/19   Mosie Lukes, MD  prochlorperazine (COMPAZINE) 10 MG tablet Take 1 tablet (10 mg total) by mouth every 6 (six) hours as needed (Nausea or vomiting). 05/07/13 07/24/13  Gardenia Phlegm,  NP  prochlorperazine (COMPAZINE) 25 MG suppository Place 1 suppository (25 mg total) rectally every 12 (twelve) hours as needed for nausea. 05/07/13 07/24/13  Gardenia Phlegm, NP    ALLERGY: No Known Allergies  Social History   Tobacco Use  . Smoking status: Never Smoker  . Smokeless tobacco: Never Used  Substance Use Topics  . Alcohol use: No     Family History  Problem Relation Age of Onset  . Lung cancer Father   . Hypertension Father   . Thyroid cancer Father        dx in his 83s  . Cancer Father        lung, thyroid, smoker  . Breast cancer Paternal Aunt 21  . Colon cancer Paternal Aunt        dx in her 50x  . Cervical cancer Paternal Aunt        dzx in her 55s  . Ovarian cancer Cousin        dx in her lage 45s  . Breast cancer Cousin        maternal first  cousin, once removed; dx in her late 54s  . Breast cancer Cousin        maternal first cousin once removed; dx in late 34s  . Hypertension Mother   . Diabetes Mother   . Dementia Mother   . Hypertension Brother   . Seizures Brother        Alcohol induced.  . Alcohol abuse Brother        drinker, smoker  . Cancer Paternal Uncle        oral cancer  . Kidney cancer Paternal Grandmother   . Arthritis Daughter        back surgery  . Cancer Cousin        several paternal cousins with brain cancer, leukemia, and other cancers  . Cancer Sister        stomach     ROS   Review of Systems  Constitutional: Negative.   HENT: Negative.   Eyes: Negative.   Respiratory: Negative.   Cardiovascular: Negative.   Gastrointestinal: Negative.   Genitourinary: Negative.   Musculoskeletal: Negative.   Skin: Negative.   Neurological: Negative.   Endo/Heme/Allergies: Negative.     Exam   Vitals:   10/08/19 0230 10/08/19 0300  BP: 132/89 130/83  Pulse:    Resp: 19 18  Temp:    SpO2:     General appearance: WDWN, NAD Eyes: No scleral injection Cardiovascular: Regular rate and rhythm without murmurs, rubs, gallops. No edema or variciosities. Distal pulses normal. Pulmonary: Effort normal, non-labored breathing Musculoskeletal:     Muscle tone upper extremities: Normal    Muscle tone lower extremities: Normal    Motor exam: Upper Extremities Deltoid Bicep Tricep Grip  Right 5/5 5/5 5/5 5/5  Left 5/5 5/5 5/5 5/5   Lower Extremity IP Quad PF DF EHL  Right 5/5 5/5 5/5 5/5 5/5  Left 5/5 5/5 5/5 5/5 5/5   Neurological Mental Status:    - Patient is awake, alert, oriented to person, place, month, year, and situation    - Patient is able to give a clear and coherent history.    - No signs of aphasia or neglect Cranial Nerves    - II: Visual Fields are full. PERRL    - III/IV/VI: EOMI without ptosis or diploplia.     - V: Facial sensation is grossly normal    - VII: Facial movement  is symmetric.     - VIII: hearing is intact to voice    - X: Uvula elevates symmetrically    - XI: Shoulder shrug is symmetric.    - XII: tongue is midline without atrophy or fasciculations.  Sensory: Sensation grossly intact to LT  Results - Imaging/Labs   Results for orders placed or performed during the hospital encounter of 10/07/19 (from the past 48 hour(s))  CBC with Differential     Status: Abnormal   Collection Time: 10/07/19  6:30 PM  Result Value Ref Range   WBC 6.4 4.0 - 10.5 K/uL   RBC 4.25 3.87 - 5.11 MIL/uL   Hemoglobin 11.8 (L) 12.0 - 15.0 g/dL   HCT 37.3 36.0 - 46.0 %   MCV 87.8 80.0 - 100.0 fL   MCH 27.8 26.0 - 34.0 pg   MCHC 31.6 30.0 - 36.0 g/dL   RDW 14.9 11.5 - 15.5 %   Platelets 186 150 - 400 K/uL   nRBC 0.0 0.0 - 0.2 %   Neutrophils Relative % 42 %   Neutro Abs 2.7 1.7 - 7.7 K/uL   Lymphocytes Relative 49 %   Lymphs Abs 3.0 0.7 - 4.0 K/uL   Monocytes Relative 8 %   Monocytes Absolute 0.5 0.1 - 1.0 K/uL   Eosinophils Relative 1 %   Eosinophils Absolute 0.1 0.0 - 0.5 K/uL   Basophils Relative 0 %   Basophils Absolute 0.0 0.0 - 0.1 K/uL   Immature Granulocytes 0 %   Abs Immature Granulocytes 0.02 0.00 - 0.07 K/uL    Comment: Performed at [facility], [address] [place], [name] [address]  Comprehensive metabolic panel     Status: Abnormal   Collection Time: 10/07/19  6:30 PM  Result Value Ref Range   Sodium 139 135 - 145 mmol/L   Potassium 4.0 3.5 - 5.1 mmol/L   Chloride 104 98 - 111 mmol/L   CO2 27 22 - 32 mmol/L   Glucose, Bld 112 (H) 70 - 99 mg/dL   BUN 10 8 - 23 mg/dL   Creatinine, Ser 1.18 (H) 0.44 - 1.00 mg/dL   Calcium 10.0 8.9 - 10.3 mg/dL   Total Protein 7.6 6.5 - 8.1 g/dL   Albumin 4.0 3.5 - 5.0 g/dL   AST 27 15 - 41 U/L   ALT 26 0 - 44 U/L   Alkaline Phosphatase 92 38 - 126 U/L   Total Bilirubin 0.5 0.3 - 1.2 mg/dL   GFR calc non Af Amer 49 (L) >60 mL/min   GFR calc Af Amer 56 (L) >60 mL/min   Anion gap 8 5 - 15     Comment: Performed at [facility], [address] [place], [name] [address]    Ct Angio Head W Or Wo Contrast  Result Date: 10/07/2019 CLINICAL DATA:  63 year old female with abnormal speech, off balance for 2 weeks, bilateral hand weakness. Slight headache. Left-side bruit on exam. EXAM: CT ANGIOGRAPHY HEAD AND NECK TECHNIQUE: Multidetector CT imaging of the head and neck was performed using the standard protocol during bolus administration of intravenous contrast. Multiplanar CT image reconstructions and MIPs were obtained to evaluate the vascular anatomy. Carotid stenosis measurements (when applicable) are obtained utilizing NASCET criteria, using the distal internal carotid diameter as the denominator. CONTRAST:  173mL OMNIPAQUE IOHEXOL 350 MG/ML SOLN COMPARISON:  Head CT without contrast 07/18/2013. Brain MRI and intracranial MRA 07/19/2013. FINDINGS: CT HEAD Brain: Cerebral volume remains normal.  No midline shift, ventriculomegaly, mass effect, evidence of mass lesion, intracranial hemorrhage or evidence of cortically based acute infarction. Best seen on coronal image 32 there is a subtle 3 millimeter area of asymmetric hyperdensity along the junction of the right middle and superior frontal gyri. This is also on sagittal image 28, where it appears more linear. No regional edema or mass effect. This was not apparent on the 2014 comparisons. Elsewhere gray-white matter differentiation is within normal limits throughout the brain. Calvarium and skull base: Negative. Paranasal sinuses: Visualized paranasal sinuses and mastoids are clear. Orbits: Visualized orbits and scalp soft tissues are within normal limits. CTA NECK Skeleton: Reversal of cervical lordosis associated with advanced lower cervical disc and endplate degeneration. No acute osseous abnormality identified. Upper chest: Negative. Other neck: Negative. Aortic arch: 3 vessel arch configuration with minimal arch atherosclerosis.  Right carotid system: Streak artifact at the right thoracic inlet due to dense subclavian vein contrast. Brachiocephalic artery and proximal right CCA appear normal. Negative right carotid bifurcation and cervical right ICA. Left carotid system: Negative left CCA origin. Mildly tortuous proximal left CCA. At the left carotid bifurcation the left ICA origin and bulb appear normal and widely patent but there is soft atherosclerotic stenosis of the ECA origin (series 13, image 129). Negative cervical left ICA. Vertebral arteries: Proximal right subclavian artery and right vertebral artery origin appear normal. The right vertebral artery is patent to the skull base without stenosis. Proximal left subclavian artery and left vertebral artery origin are normal. Tortuous left V1 segment. Patent left vertebral artery to the skull base without stenosis. CTA HEAD Posterior circulation: Early takeoff of both PICA origins which are otherwise normal. Mildly dominant distal right vertebral artery. No V4 segment plaque or stenosis. Patent vertebrobasilar junction and basilar artery. Normal SCA and PCA origins. Posterior communicating arteries are diminutive or absent. Bilateral PCA branches are within normal limits. Anterior circulation: Both ICA siphons are patent. On the left there is minimal calcified plaque without stenosis. Mild calcified plaque on the right without stenosis. Patent carotid termini. Normal MCA and ACA origins. Anterior communicating artery and bilateral ACA branches are within normal limits. Left MCA M1 segment and bifurcation are patent without stenosis. Left MCA branches are within normal limits. Right MCA M1 segment has an early trifurcation without stenosis. Right MCA branches are within normal limits. There is no abnormal vascularity identified in the right middle frontal gyrus area to correspond to the subtle plain CT hyperdensity on series 7, image 32. Venous sinuses: Patent. Anatomic variants: None.  Review of the MIP images confirms the above findings IMPRESSION: 1. Essentially normal for age CTA head and neck; there is soft plaque at the Left ECA origin but minimal plaque otherwise and no arterial stenosis. 2. Subtle asymmetric hyperdensity on the non-contrast Head CT in the right frontal lobe subcortical white matter (series 7, image 32). No associated edema or mass effect, and no correlation on today's CTA, or the 2014 MRI. As such this may be most likely artifact. A Brain MRI without and with contrast would be necessary to further characterize this finding. 3. Otherwise negative noncontrast head CT. 4. Advanced cervical spine degeneration. Electronically Signed   By: Genevie Ann M.D.   On: 10/07/2019 20:27   Ct Angio Neck W And/or Wo Contrast  Result Date: 10/07/2019 CLINICAL DATA:  63 year old female with abnormal speech, off balance for 2 weeks, bilateral hand weakness. Slight headache. Left-side bruit on exam. EXAM: CT ANGIOGRAPHY HEAD AND NECK TECHNIQUE: Multidetector  CT imaging of the head and neck was performed using the standard protocol during bolus administration of intravenous contrast. Multiplanar CT image reconstructions and MIPs were obtained to evaluate the vascular anatomy. Carotid stenosis measurements (when applicable) are obtained utilizing NASCET criteria, using the distal internal carotid diameter as the denominator. CONTRAST:  186mL OMNIPAQUE IOHEXOL 350 MG/ML SOLN COMPARISON:  Head CT without contrast 07/18/2013. Brain MRI and intracranial MRA 07/19/2013. FINDINGS: CT HEAD Brain: Cerebral volume remains normal. No midline shift, ventriculomegaly, mass effect, evidence of mass lesion, intracranial hemorrhage or evidence of cortically based acute infarction. Best seen on coronal image 32 there is a subtle 3 millimeter area of asymmetric hyperdensity along the junction of the right middle and superior frontal gyri. This is also on sagittal image 28, where it appears more linear. No  regional edema or mass effect. This was not apparent on the 2014 comparisons. Elsewhere gray-white matter differentiation is within normal limits throughout the brain. Calvarium and skull base: Negative. Paranasal sinuses: Visualized paranasal sinuses and mastoids are clear. Orbits: Visualized orbits and scalp soft tissues are within normal limits. CTA NECK Skeleton: Reversal of cervical lordosis associated with advanced lower cervical disc and endplate degeneration. No acute osseous abnormality identified. Upper chest: Negative. Other neck: Negative. Aortic arch: 3 vessel arch configuration with minimal arch atherosclerosis. Right carotid system: Streak artifact at the right thoracic inlet due to dense subclavian vein contrast. Brachiocephalic artery and proximal right CCA appear normal. Negative right carotid bifurcation and cervical right ICA. Left carotid system: Negative left CCA origin. Mildly tortuous proximal left CCA. At the left carotid bifurcation the left ICA origin and bulb appear normal and widely patent but there is soft atherosclerotic stenosis of the ECA origin (series 13, image 129). Negative cervical left ICA. Vertebral arteries: Proximal right subclavian artery and right vertebral artery origin appear normal. The right vertebral artery is patent to the skull base without stenosis. Proximal left subclavian artery and left vertebral artery origin are normal. Tortuous left V1 segment. Patent left vertebral artery to the skull base without stenosis. CTA HEAD Posterior circulation: Early takeoff of both PICA origins which are otherwise normal. Mildly dominant distal right vertebral artery. No V4 segment plaque or stenosis. Patent vertebrobasilar junction and basilar artery. Normal SCA and PCA origins. Posterior communicating arteries are diminutive or absent. Bilateral PCA branches are within normal limits. Anterior circulation: Both ICA siphons are patent. On the left there is minimal calcified  plaque without stenosis. Mild calcified plaque on the right without stenosis. Patent carotid termini. Normal MCA and ACA origins. Anterior communicating artery and bilateral ACA branches are within normal limits. Left MCA M1 segment and bifurcation are patent without stenosis. Left MCA branches are within normal limits. Right MCA M1 segment has an early trifurcation without stenosis. Right MCA branches are within normal limits. There is no abnormal vascularity identified in the right middle frontal gyrus area to correspond to the subtle plain CT hyperdensity on series 7, image 32. Venous sinuses: Patent. Anatomic variants: None. Review of the MIP images confirms the above findings IMPRESSION: 1. Essentially normal for age CTA head and neck; there is soft plaque at the Left ECA origin but minimal plaque otherwise and no arterial stenosis. 2. Subtle asymmetric hyperdensity on the non-contrast Head CT in the right frontal lobe subcortical white matter (series 7, image 32). No associated edema or mass effect, and no correlation on today's CTA, or the 2014 MRI. As such this may be most likely artifact. A Brain MRI  without and with contrast would be necessary to further characterize this finding. 3. Otherwise negative noncontrast head CT. 4. Advanced cervical spine degeneration. Electronically Signed   By: Genevie Ann M.D.   On: 10/07/2019 20:27   Mr Jeri Cos And Wo Contrast  Result Date: 10/08/2019 CLINICAL DATA:  64 year old female with abnormal speech, off balance for 2 weeks, bilateral hand weakness. Slight headache. Left-side bruit on exam. Negative CTA head and neck earlier tonight but subtle right frontal lobe white matter region hyperdensity on plain head CT which was not correlated on the CTA or prior exams. Advanced cervical spine degeneration. EXAM: MRI HEAD WITHOUT AND WITH CONTRAST TECHNIQUE: Multiplanar, multiecho pulse sequences of the brain and surrounding structures were obtained without and with  intravenous contrast. CONTRAST:  7.47mL GADAVIST GADOBUTROL 1 MMOL/ML IV SOLN COMPARISON:  CTA head and neck earlier tonight. Brain MRI 07/18/2013. FINDINGS: Brain: No restricted diffusion to suggest acute infarction. No midline shift, mass effect, evidence of mass lesion, ventriculomegaly, extra-axial collection or acute intracranial hemorrhage. Cervicomedullary junction and pituitary are within normal limits. No signal changes or abnormality in the white matter near the junction of the right middle and superior frontal gyri to correspond to the subtle CT finding earlier tonight. Pearline Cables and white matter signal is within normal limits for age throughout the brain. No chronic cerebral blood products or cortical encephalomalacia. No abnormal enhancement identified. No dural thickening. Vascular: Major intracranial vascular flow voids are stable since 2014. Pneumatized left anterior clinoid process (normal variant). The major dural venous sinuses are enhancing and appear to be patent. Skull and upper cervical spine: Cervical spine reported separately. Visualized bone marrow signal is within normal limits. Sinuses/Orbits: Negative orbits. Paranasal Visualized paranasal sinuses and mastoids are stable and well pneumatized. Other: Visible internal auditory structures appear normal. Scalp and face soft tissues appear negative. IMPRESSION: No acute intracranial abnormality. Stable since 2014 and normal for age MRI appearance of the brain - the subtle right frontal lobe hyperdensity on plain head CT earlier tonight was artifact. Electronically Signed   By: Genevie Ann M.D.   On: 10/08/2019 02:09   Mr Cervical Spine W Or Wo Contrast  Result Date: 10/08/2019 CLINICAL DATA:  63 year old female with abnormal speech, off balance for 2 weeks, bilateral hand weakness. Slight headache. Left-side bruit on exam. Advanced cervical spine degeneration. EXAM: MRI CERVICAL SPINE WITHOUT AND WITH CONTRAST TECHNIQUE: Multiplanar and multiecho  pulse sequences of the cervical spine, to include the craniocervical junction and cervicothoracic junction, were obtained without and with intravenous contrast. CONTRAST:  7.66mL GADAVIST GADOBUTROL 1 MMOL/ML IV SOLN in conjunction with contrast enhanced imaging of the brain reported separately. COMPARISON:  CTA head and neck earlier tonight. Brain MRI reported separately. FINDINGS: Alignment: Straightening and reversal of cervical lordosis stable from the earlier CTA. Vertebrae: Chronic degenerative endplate marrow signal changes in the cervical spine. No marrow edema or evidence of acute osseous abnormality. Background bone marrow signal is within normal limits. Cord: Subtle abnormal spinal cord T2 hyperintensity most pronounced at the dorsal cord C6-C7 level (series 5, image 7). But there is questionable additional minor cord signal abnormality also in the mid cervical cord. These correspond to levels of advanced spinal degeneration as detailed below. Above C3-C4 and below C7-T1 the visible spinal cord appears normal. No abnormal intradural enhancement. No dural thickening. Posterior Fossa, vertebral arteries, paraspinal tissues: Cervicomedullary junction is within normal limits. Brain MRI reported separately today. Preserved major vascular flow voids in the neck. Negative visible neck soft  tissues and lung apices. Disc levels: C2-C3:  Mild facet hypertrophy.  No stenosis. C3-C4: Moderate to severe facet hypertrophy on the right. Disc bulge and mild endplate spurring with superimposed central disc protrusion (series 8, image 10). Mild spinal stenosis and up to mild cord mass effect. Moderate to severe right C4 foraminal stenosis. C4-C5: Circumferential disc osteophyte complex with small superimposed central disc protrusion. Mild spinal stenosis and spinal cord mass effect. Moderate to severe left and mild right C5 foraminal stenosis. C5-C6: Disc space loss with circumferential disc osteophyte complex. Right  greater than left broad-based posterior and biforaminal involvement. Mild spinal stenosis. Mild if any cord mass effect. Severe bilateral C6 foraminal stenosis. C6-C7: Disc space loss with bulky circumferential disc osteophyte complex. Broad-based posterior component. Biforaminal involvement. Borderline to mild spinal stenosis. No cord mass effect. Moderate to severe bilateral C7 foraminal stenosis. C7-T1: Mild foraminal endplate spurring and moderate bilateral posterior element hypertrophy. No spinal stenosis. Mild bilateral C8 foraminal stenosis. No upper thoracic spinal stenosis. IMPRESSION: 1. Advanced cervical disc and endplate degeneration with multilevel mild spinal stenosis. 2. Up to mild associated spinal cord mass effect, and there is subsequent Cervical Cord Myelomalacia suspected - most pronounced at C6-C7. 3. Associated moderate or severe neural foraminal stenosis at the right C4, left C5, bilateral C6 and C7 nerve levels. Electronically Signed   By: Genevie Ann M.D.   On: 10/08/2019 02:20    Impression/Plan   63 y.o. female with intermittent, transient neurologic symptoms including headache, N/T BUE, dysarthria, word finding difficulties, balance issues, cognitive issues (forgetting things easily) over the past several weeks, in close proximity to starting hydroxyzine.. While she does have cervical stenosis with cord mass effect and subsequent myelomalacia, this certainly would not explain the cluster of her symptoms. Nonetheless, she does have pathology in her neck that will likely need to be addressed surgically. We had a long discussion about possible admission vs outpatient follow up. Given lack of symptoms at present, patient would like to f/u outpatient. We will plan to see her Thursday at 10am for close follow up. Red flag symptoms discussed. Our secretary will reach out to the patient to confirm appt.  Ferne Reus, PA-C [facility] [employer]

## 2019-10-08 NOTE — Discharge Instructions (Addendum)
Someone from Dr. Kathyrn Sheriff office will call you to confirm your appointment for Thursday 10/09/2019, if you do not hear from them, please call the number attached to your chart.  If you experience any worsening symptoms, please return to the emergency department.

## 2019-10-08 NOTE — ED Notes (Signed)
Patient verbalizes understanding of discharge instructions. Opportunity for questioning and answers were provided. Armband removed by staff, pt discharged from ED. Pt. ambulatory and discharged home.  

## 2019-10-09 DIAGNOSIS — M4802 Spinal stenosis, cervical region: Secondary | ICD-10-CM

## 2019-10-09 HISTORY — DX: Spinal stenosis, cervical region: M48.02

## 2019-10-14 DIAGNOSIS — M25562 Pain in left knee: Secondary | ICD-10-CM | POA: Diagnosis not present

## 2019-10-14 DIAGNOSIS — R296 Repeated falls: Secondary | ICD-10-CM | POA: Diagnosis not present

## 2019-10-14 DIAGNOSIS — M25561 Pain in right knee: Secondary | ICD-10-CM | POA: Diagnosis not present

## 2019-10-16 DIAGNOSIS — R296 Repeated falls: Secondary | ICD-10-CM | POA: Diagnosis not present

## 2019-10-16 DIAGNOSIS — M25562 Pain in left knee: Secondary | ICD-10-CM | POA: Diagnosis not present

## 2019-10-16 DIAGNOSIS — M25561 Pain in right knee: Secondary | ICD-10-CM | POA: Diagnosis not present

## 2019-10-21 ENCOUNTER — Other Ambulatory Visit: Payer: Self-pay | Admitting: Hematology and Oncology

## 2019-10-21 DIAGNOSIS — C50212 Malignant neoplasm of upper-inner quadrant of left female breast: Secondary | ICD-10-CM

## 2019-10-28 DIAGNOSIS — M25562 Pain in left knee: Secondary | ICD-10-CM | POA: Diagnosis not present

## 2019-10-28 DIAGNOSIS — R296 Repeated falls: Secondary | ICD-10-CM | POA: Diagnosis not present

## 2019-10-28 DIAGNOSIS — M25561 Pain in right knee: Secondary | ICD-10-CM | POA: Diagnosis not present

## 2019-11-04 DIAGNOSIS — R296 Repeated falls: Secondary | ICD-10-CM | POA: Diagnosis not present

## 2019-11-04 DIAGNOSIS — M25562 Pain in left knee: Secondary | ICD-10-CM | POA: Diagnosis not present

## 2019-11-04 DIAGNOSIS — M25561 Pain in right knee: Secondary | ICD-10-CM | POA: Diagnosis not present

## 2019-11-10 ENCOUNTER — Other Ambulatory Visit: Payer: Self-pay | Admitting: Family Medicine

## 2019-11-11 ENCOUNTER — Telehealth: Payer: Self-pay | Admitting: Family Medicine

## 2019-11-11 NOTE — Telephone Encounter (Signed)
Please advise 

## 2019-11-11 NOTE — Telephone Encounter (Signed)
Patient calling to request advice. She states that for the past 2-3 months she's been dropping items from her hand while holding them. Patient states it seems to happen randomly. Patient did note that occasionally it will feel like a burning/tingling sensation before it happens. Patient states that her extremities do not go numb, just that she will drop what she is holding without being able to stop it. Patient has upcoming appointment with neurology but would like to speak with Dr. Charlett Blake as well. Patient would like to speak with clinical staff to consider being worked in with provider.

## 2019-11-12 NOTE — Telephone Encounter (Signed)
Please find her a spot in next couple of days, if no obvious spots can put her at beginning or end of day on Friday

## 2019-11-13 NOTE — Telephone Encounter (Signed)
Called patient unable to leave voicemail  Wanted to see if we can schedule an appt next week with pcp

## 2019-11-18 ENCOUNTER — Ambulatory Visit (INDEPENDENT_AMBULATORY_CARE_PROVIDER_SITE_OTHER): Payer: Medicare Other | Admitting: Family Medicine

## 2019-11-18 ENCOUNTER — Other Ambulatory Visit: Payer: Self-pay

## 2019-11-18 DIAGNOSIS — F329 Major depressive disorder, single episode, unspecified: Secondary | ICD-10-CM

## 2019-11-18 DIAGNOSIS — R2681 Unsteadiness on feet: Secondary | ICD-10-CM | POA: Diagnosis not present

## 2019-11-18 DIAGNOSIS — R251 Tremor, unspecified: Secondary | ICD-10-CM | POA: Diagnosis not present

## 2019-11-18 DIAGNOSIS — R1310 Dysphagia, unspecified: Secondary | ICD-10-CM

## 2019-11-18 DIAGNOSIS — I10 Essential (primary) hypertension: Secondary | ICD-10-CM

## 2019-11-18 DIAGNOSIS — F32A Depression, unspecified: Secondary | ICD-10-CM

## 2019-11-18 DIAGNOSIS — R413 Other amnesia: Secondary | ICD-10-CM

## 2019-11-18 DIAGNOSIS — F419 Anxiety disorder, unspecified: Secondary | ICD-10-CM

## 2019-11-18 DIAGNOSIS — G3184 Mild cognitive impairment, so stated: Secondary | ICD-10-CM

## 2019-11-18 DIAGNOSIS — R739 Hyperglycemia, unspecified: Secondary | ICD-10-CM

## 2019-11-19 NOTE — Assessment & Plan Note (Signed)
hgba1c acceptable, minimize simple carbs. Increase exercise as tolerated.  

## 2019-11-19 NOTE — Progress Notes (Signed)
Virtual Visit via Video Note  I connected with Kristi [name] on 11/18/19 at  9:20 AM EST by a video enabled telemedicine application and verified that I am speaking with the correct person using two identifiers.  Location: Patient: home Provider: home   I discussed the limitations of evaluation and management by telemedicine and the availability of in person appointments. The patient expressed understanding and agreed to proceed. Kristi [name], CMA was able to get the patient set up on a visit, phone after being unable to set up on video visit   Subjective:    Patient ID: Kristi [name], female    DOB: [birth year], 64 y.o.   MRN: [id number]  No chief complaint on file.   HPI Patient is in today for evaluation of worsening tremor, weakness, anxiety and memory loss. She is accompanied by her daughter. They note psychiatry stopped the Clonopin but she reports that was not really helping her anxiety. The Hydroxyzine they have her on at bid dosing is not helping much either. They report no side effects. She is feeling weaker and more unsteady. Has episodes of falling and near falls. Both hands tremor equally. Denies CP/palp/SOB/HA/congestion/fevers/GI or GU c/o. Taking meds as prescribed  Past Medical History:  Diagnosis Date  . Anemia    Iron deficinecy anemia  . Anemia 05/14/2017  . Anxiety   . Anxiety and depression 05/15/2014  . Arthritis   . Breast cancer ([place])    left ,last radiation 2'15, last chemo 8'14  . Constipation 11/13/2016  . Depression   . History of chicken pox   . History of radiation therapy 09/09/13-10/28/13   45 gray to left breast, lumpectomy cavity boosted to 63 gray  . HTN (hypertension) 11/13/2016  . Hyperglycemia 01/09/2016  . Hyperlipidemia 05/14/2017  . Hypertension   . MRSA (methicillin resistant Staphylococcus aureus) 2009   right groin area-no issues now. 04-07-14 PCR screen negative today.  . Neuropathy   . Preventative health care 11/13/2016    Past Surgical  History:  Procedure Laterality Date  . ANAL SPHINCTEROTOMY  04/2011  . APPENDECTOMY  1980  . AXILLARY LYMPH NODE DISSECTION Left 02/04/2013   Procedure: LEFT AXILLARY LYMPH NODE DISSECTION;  Surgeon: Stark Klein, MD;  Location: [place];  Service: General;  Laterality: Left;  End: 8916  . BREAST LUMPECTOMY WITH NEEDLE LOCALIZATION Left 02/04/2013   Procedure: LEFT BREAST LUMPECTOMY WITH NEEDLE LOCALIZATION;  Surgeon: Stark Klein, MD;  Location: [place];  Service: General;  Laterality: Left;  . BREAST SURGERY     Lumpectomy in april 2014  . HEMORRHOID SURGERY  04/2011   ligation  . MASTECTOMY Left 02/15/2017  . PORT-A-CATH REMOVAL N/A 04/16/2014   Procedure: REMOVAL PORT-A-CATH;  Surgeon: Stark Klein, MD;  Location: [facility];  Service: General;  Laterality: N/A;  . PORTACATH PLACEMENT Right 02/04/2013   Procedure: INSERTION PORT-A-CATH;  Surgeon: Stark Klein, MD;  Location: [facility];  Service: General;  Laterality: Right;  Start Time: 9450.  Marland Kitchen SHOULDER ARTHROSCOPY WITH ROTATOR CUFF REPAIR AND SUBACROMIAL DECOMPRESSION Left 02/24/2014   Procedure: SHOULDER ARTHROSCOPY WITH ROTATOR CUFF REPAIR AND SUBACROMIAL DECOMPRESSION;  Surgeon: Meredith Pel, MD;  Location: [facility];  Service: Orthopedics;  Laterality: Left;  LEFT SHOULDER DIAGNOSTIC OPERATIVE ARTHROSCOPY, SUBACROMIAL DECOMPRESSION, ROTATOR CUFF TEAR REPAIR  . SIMPLE MASTECTOMY WITH AXILLARY SENTINEL NODE BIOPSY Left 02/15/2017   Procedure: LEFT MASTECTOMY;  Surgeon: Stark Klein, MD;  Location: [place];  Service: General;  Laterality: Left;  . TOTAL MASTECTOMY Right  12/26/2018   Procedure: RIGHT BREAST PROPHYLATIC MASTECTOMY;  Surgeon: Stark Klein, MD;  Location: [place];  Service: General;  Laterality: Right;    Family History  Problem Relation Age of Onset  . Lung cancer Father   . Hypertension Father   . Thyroid cancer Father        dx in his 69s  . Cancer Father        lung, thyroid, smoker  . Breast cancer Paternal Aunt 50  . Colon cancer  Paternal Aunt        dx in her 50x  . Cervical cancer Paternal Aunt        dzx in her 69s  . Ovarian cancer Cousin        dx in her lage 40s  . Breast cancer Cousin        maternal first cousin, once removed; dx in her late 73s  . Breast cancer Cousin        maternal first cousin once removed; dx in late 54s  . Hypertension Mother   . Diabetes Mother   . Dementia Mother   . Hypertension Brother   . Seizures Brother        Alcohol induced.  . Alcohol abuse Brother        drinker, smoker  . Cancer Paternal Uncle        oral cancer  . Kidney cancer Paternal Grandmother   . Arthritis Daughter        back surgery  . Cancer Cousin        several paternal cousins with brain cancer, leukemia, and other cancers  . Cancer Sister        stomach    Social History   Socioeconomic History  . Marital status: Married    Spouse name: Kristi [name]  . Number of children: 1  . Years of education: 64  . Highest education level: Not on file  Occupational History  . 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 Smoking status: Never Smoker  . Smokeless tobacco: Never Used  Substance and Sexual Activity  . Alcohol use: No  . Drug use: No  . Sexual activity: Yes    Comment: lives with husband, disability/retirement. [employer], no dietary restrictions  Other Topics Concern  . Not on file  Social History Narrative   Patient is married Kristi [name]) and lives at home with her husband.   Patient has one daughter.   Patient is working at [employer]   Patient has a 12th grade education.   Patient drinks very little caffeine.   Social Determinants of Health   Financial Resource Strain:   . Difficulty of Paying Living Expenses: Not on file  Food Insecurity:   . Worried About Charity fundraiser in the Last Year: Not on file  . Ran Out of Food in the Last Year: Not on file  Transportation Needs:   . Lack of Transportation (Medical): Not on file  . Lack of  Transportation (Non-Medical): Not on file  Physical Activity:   . Days of Exercise per Week: Not on file  . Minutes of Exercise per Session: Not on file  Stress:   . Feeling of Stress : Not on file  Social Connections:   . Frequency of Communication with Friends and Family: Not on file  . Frequency of Social Gatherings with Friends and Family: Not on file  . Attends Religious Services: Not on  file  . Active Member of Clubs or Organizations: Not on file  . Attends Archivist Meetings: Not on file  . Marital Status: Not on file  Intimate Partner Violence:   . Fear of Current or Ex-Partner: Not on file  . Emotionally Abused: Not on file  . Physically Abused: Not on file  . Sexually Abused: Not on file    Outpatient Medications Prior to Visit  Medication Sig Dispense Refill  . amitriptyline (ELAVIL) 100 MG tablet Take 100 mg by mouth at bedtime.  2  . anastrozole (ARIMIDEX) 1 MG tablet TAKE 1 TABLET (1 MG TOTAL) BY MOUTH DAILY. TAKE 1 TABLET (1 MG) BY MOUTH DAILY IN THE MORNING 90 tablet 3  . aspirin EC 81 MG tablet Take 1 tablet (81 mg total) by mouth daily.    . B Complex-C (SUPER B COMPLEX PO) Take 1 tablet by mouth daily.    Marland Kitchen gabapentin (NEURONTIN) 800 MG tablet TAKE 1 TABLET BY MOUTH THREE TIMES A DAY (Patient not taking: Reported on 10/08/2019) 270 tablet 1  . hydrOXYzine (ATARAX/VISTARIL) 25 MG tablet Take 25 mg by mouth 2 (two) times daily as needed for anxiety.     Marland Kitchen KLOR-CON M20 20 MEQ tablet TAKE 1 TABLET BY MOUTH EVERY DAY (Patient taking differently: Take 20 mEq by mouth daily. ) 90 tablet 1  . LATUDA 80 MG TABS tablet Take 80 mg by mouth at bedtime.   1  . lidocaine-prilocaine (EMLA) cream APPLY EXTERNALLY TO THE AFFECTED AREA TWICE DAILY AS DIRECTED (Patient not taking: Reported on 10/08/2019) 30 g 3  . meloxicam (MOBIC) 7.5 MG tablet TAKE 1 TABLET BY MOUTH EVERY DAY AS NEEDED FOR PAIN 30 tablet 1  . Multiple Vitamin (MULTIVITAMIN WITH MINERALS) TABS tablet Take 1  tablet by mouth daily.    . valsartan-hydrochlorothiazide (DIOVAN-HCT) 320-25 MG tablet TAKE 1 TABLET BY MOUTH EVERY DAY IN THE MORNING (Patient taking differently: Take 1 tablet by mouth daily. ) 90 tablet 1  . clonazePAM (KLONOPIN) 0.5 MG tablet Take 1 tablet (0.5 mg total) by mouth 2 (two) times daily as needed for anxiety. 20 tablet 1   No facility-administered medications prior to visit.    No Known Allergies  Review of Systems  Constitutional: Positive for malaise/fatigue. Negative for fever.  HENT: Negative for congestion.   Eyes: Negative for blurred vision.  Respiratory: Negative for shortness of breath.   Cardiovascular: Negative for chest pain, palpitations and leg swelling.  Gastrointestinal: Negative for abdominal pain, blood in stool and nausea.  Genitourinary: Negative for dysuria and frequency.  Musculoskeletal: Negative for falls.  Skin: Negative for rash.  Neurological: Positive for tremors and weakness. Negative for dizziness, loss of consciousness and headaches.  Endo/Heme/Allergies: Negative for environmental allergies.  Psychiatric/Behavioral: Negative for depression. The patient is nervous/anxious.        Objective:    Physical Exam  Unable to obtain via phone LMP 01/20/2013  Wt Readings from Last 3 Encounters:  10/07/19 176 lb (79.8 kg)  08/25/19 176 lb (79.8 kg)  08/21/19 176 lb 3.2 oz (79.9 kg)    Diabetic Foot Exam - Simple   No data filed     Lab Results  Component Value Date   WBC 6.4 10/07/2019   HGB 11.8 (L) 10/07/2019   HCT 37.3 10/07/2019   PLT 186 10/07/2019   GLUCOSE 112 (H) 10/07/2019   CHOL 170 06/19/2019   TRIG 102.0 06/19/2019   HDL 47.10 06/19/2019   LDLCALC 103 (  H) 06/19/2019   ALT 26 10/07/2019   AST 27 10/07/2019   NA 139 10/07/2019   K 4.0 10/07/2019   CL 104 10/07/2019   CREATININE 1.18 (H) 10/07/2019   BUN 10 10/07/2019   CO2 27 10/07/2019   TSH 1.29 06/19/2019   INR 1.02 07/19/2013   HGBA1C 5.9 06/19/2019     Lab Results  Component Value Date   TSH 1.29 06/19/2019   Lab Results  Component Value Date   WBC 6.4 10/07/2019   HGB 11.8 (L) 10/07/2019   HCT 37.3 10/07/2019   MCV 87.8 10/07/2019   PLT 186 10/07/2019   Lab Results  Component Value Date   NA 139 10/07/2019   K 4.0 10/07/2019   CHLORIDE 104 01/22/2017   CO2 27 10/07/2019   GLUCOSE 112 (H) 10/07/2019   BUN 10 10/07/2019   CREATININE 1.18 (H) 10/07/2019   BILITOT 0.5 10/07/2019   ALKPHOS 92 10/07/2019   AST 27 10/07/2019   ALT 26 10/07/2019   PROT 7.6 10/07/2019   ALBUMIN 4.0 10/07/2019   CALCIUM 10.0 10/07/2019   ANIONGAP 8 10/07/2019   EGFR 67 (L) 01/22/2017   GFR 57.41 (L) 06/19/2019   Lab Results  Component Value Date   CHOL 170 06/19/2019   Lab Results  Component Value Date   HDL 47.10 06/19/2019   Lab Results  Component Value Date   LDLCALC 103 (H) 06/19/2019   Lab Results  Component Value Date   TRIG 102.0 06/19/2019   Lab Results  Component Value Date   CHOLHDL 4 06/19/2019   Lab Results  Component Value Date   HGBA1C 5.9 06/19/2019       Assessment & Plan:   Problem List Items Addressed This Visit    Anxiety and depression    They are counseled at length regarding about anxiety worsening as dementia worsens. They have stopped her Clonopin but they can try increasing her Hydroxyzine to 50 mg po tid and see if that helps her manage her anxiety while awaiting her further neurologic work up.       Hyperglycemia    hgba1c acceptable, minimize simple carbs. Increase exercise as tolerated.       HTN (hypertension)    Monitor and report any concerning numbers, no changes to meds. Encouraged heart healthy diet such as the DASH diet and exercise as tolerated.       Mild cognitive impairment    She has an appointment with neurology next month for thorough evaluation. She and her daughter have been noting increased tremor in her hands and weakness in her legs with an unsteady gait. Consider  Parkinson's disease.        Other Visit Diagnoses    Tremor    -  Primary   Relevant Orders   Ambulatory referral to Neurology   Unsteady gait       Relevant Orders   Ambulatory referral to Neurology   Dysphagia, unspecified type       Relevant Orders   Ambulatory referral to Neurology   Memory loss       Relevant Orders   Ambulatory referral to Neurology      I have discontinued Kristi [name] "Theresa"'s clonazePAM. I am also having her maintain her B Complex-C (SUPER B COMPLEX PO), amitriptyline, multivitamin with minerals, Latuda, gabapentin, Klor-Con M20, lidocaine-prilocaine, aspirin EC, valsartan-hydrochlorothiazide, hydrOXYzine, anastrozole, and meloxicam.  No orders of the defined types were placed in this encounter.    I discussed the  assessment and treatment plan with the patient. The patient was provided an opportunity to ask questions and all were answered. The patient agreed with the plan and demonstrated an understanding of the instructions.   The patient was advised to call back or seek an in-person evaluation if the symptoms worsen or if the condition fails to improve as anticipated.  I provided 25 minutes of non-face-to-face time during this encounter.   [name], MD

## 2019-11-19 NOTE — Assessment & Plan Note (Signed)
They are counseled at length regarding about anxiety worsening as dementia worsens. They have stopped her Clonopin but they can try increasing her Hydroxyzine to 50 mg po tid and see if that helps her manage her anxiety while awaiting her further neurologic work up.

## 2019-11-19 NOTE — Assessment & Plan Note (Signed)
She has an appointment with neurology next month for thorough evaluation. She and her daughter have been noting increased tremor in her hands and weakness in her legs with an unsteady gait. Consider Parkinson's disease.

## 2019-11-19 NOTE — Assessment & Plan Note (Signed)
Monitor and report any concerning numbers, no changes to meds. Encouraged heart healthy diet such as the DASH diet and exercise as tolerated.  

## 2019-11-21 ENCOUNTER — Ambulatory Visit: Payer: Medicare Other | Admitting: Family Medicine

## 2019-12-08 ENCOUNTER — Encounter: Payer: Self-pay | Admitting: Neurology

## 2019-12-08 ENCOUNTER — Ambulatory Visit: Payer: Medicare Other | Admitting: Neurology

## 2019-12-08 ENCOUNTER — Other Ambulatory Visit: Payer: Self-pay

## 2019-12-08 VITALS — BP 145/92 | HR 98 | Temp 96.1°F | Ht 62.0 in | Wt 165.0 lb

## 2019-12-08 DIAGNOSIS — R269 Unspecified abnormalities of gait and mobility: Secondary | ICD-10-CM

## 2019-12-08 DIAGNOSIS — E538 Deficiency of other specified B group vitamins: Secondary | ICD-10-CM | POA: Diagnosis not present

## 2019-12-08 DIAGNOSIS — R413 Other amnesia: Secondary | ICD-10-CM | POA: Diagnosis not present

## 2019-12-08 MED ORDER — GABAPENTIN 800 MG PO TABS: 800.0000 mg | ORAL_TABLET | Freq: Two times a day (BID) | ORAL | Status: DC

## 2019-12-08 MED ORDER — GABAPENTIN 800 MG PO TABS: 800.0000 mg | ORAL_TABLET | Freq: Three times a day (TID) | ORAL | Status: DC

## 2019-12-08 NOTE — Progress Notes (Signed)
Reason for visit: Memory disturbance, gait disturbance  Referring physician: Dr. Dennis Bast is a 64 y.o. female  History of present illness:  Kristi [name] is a 64 year old left-handed black female with a history of depression and an anxiety disorder.  The patient is followed through psychiatry.  She recently was taken off of clonazepam with concerns of walking difficulty and memory problems.  The patient was in the hospital on 07 October 2019 with problems with gait instability, a cervical spine MRI showed evidence of spinal stenosis and myelomalacia, she has been seen through neurosurgery but surgical decompression was not considered.  The patient was also seen at the hospital on 25 August 2019 with a fall.  The patient indicates that she has a neuropathy issue following chemotherapy for breast cancer, she has been on gabapentin 800 mg twice daily.  She also is on amitriptyline 100 mg at night, she has been on this for 1 to 2 years.  The patient is having severe problems with anxiety over the last 3 months or so, she is having anywhere from 2-10 panic attacks daily.  The patient is having some problems with directions with driving, this is not severe.  She is able to keep up with her medications and appointments without requiring assistance.  She does not do the finances and never has.  The patient does have some short-term memory issues, she may repeat herself on occasion.  She has a sensation of feeling floaty at times with walking.  She has chronic constipation issues but denies issues controlling the bowels or the bladder.  She at times may have difficulty with sleeping but usually she sleeps well at night.  She may drop things from her hands.  She reports that she has difficulty expressing her thoughts and she at times may stutter with speech.  The patient was sent for physical therapy for gait training but she decided to stop the therapy on her own.  She is sent to this office for an  evaluation.  Past Medical History:  Diagnosis Date  . Anemia    Iron deficinecy anemia  . Anemia 05/14/2017  . Anxiety   . Anxiety and depression 05/15/2014  . Arthritis   . Breast cancer ([place])    left ,last radiation 2'15, last chemo 8'14  . Constipation 11/13/2016  . Depression   . History of chicken pox   . History of radiation therapy 09/09/13-10/28/13   45 gray to left breast, lumpectomy cavity boosted to 63 gray  . HTN (hypertension) 11/13/2016  . Hyperglycemia 01/09/2016  . Hyperlipidemia 05/14/2017  . Hypertension   . MRSA (methicillin resistant Staphylococcus aureus) 2009   right groin area-no issues now. 04-07-14 PCR screen negative today.  . Neuropathy   . Preventative health care 11/13/2016    Past Surgical History:  Procedure Laterality Date  . ANAL SPHINCTEROTOMY  04/2011  . APPENDECTOMY  1980  . AXILLARY LYMPH NODE DISSECTION Left 02/04/2013   Procedure: LEFT AXILLARY LYMPH NODE DISSECTION;  Surgeon: Stark Klein, MD;  Location: [facility];  Service: General;  Laterality: Left;  End: [id number]  . BREAST LUMPECTOMY WITH NEEDLE LOCALIZATION Left 02/04/2013   Procedure: LEFT BREAST LUMPECTOMY WITH NEEDLE LOCALIZATION;  Surgeon: Stark Klein, MD;  Location: [facility];  Service: General;  Laterality: Left;  . BREAST SURGERY     Lumpectomy in april 2014  . HEMORRHOID SURGERY  04/2011   ligation  . MASTECTOMY Left 02/15/2017  . PORT-A-CATH REMOVAL N/A 04/16/2014  Procedure: REMOVAL PORT-A-CATH;  Surgeon: Stark Klein, MD;  Location: [facility];  Service: General;  Laterality: N/A;  . PORTACATH PLACEMENT Right 02/04/2013   Procedure: INSERTION PORT-A-CATH;  Surgeon: Stark Klein, MD;  Location: [facility];  Service: General;  Laterality: Right;  Start Time: [id number].  Marland Kitchen SHOULDER ARTHROSCOPY WITH ROTATOR CUFF REPAIR AND SUBACROMIAL DECOMPRESSION Left 02/24/2014   Procedure: SHOULDER ARTHROSCOPY WITH ROTATOR CUFF REPAIR AND SUBACROMIAL DECOMPRESSION;  Surgeon: Meredith Pel, MD;  Location: [facility];  Service:  Orthopedics;  Laterality: Left;  LEFT SHOULDER DIAGNOSTIC OPERATIVE ARTHROSCOPY, SUBACROMIAL DECOMPRESSION, ROTATOR CUFF TEAR REPAIR  . SIMPLE MASTECTOMY WITH AXILLARY SENTINEL NODE BIOPSY Left 02/15/2017   Procedure: LEFT MASTECTOMY;  Surgeon: Stark Klein, MD;  Location: [facility];  Service: General;  Laterality: Left;  . TOTAL MASTECTOMY Right 12/26/2018   Procedure: RIGHT BREAST PROPHYLATIC MASTECTOMY;  Surgeon: Stark Klein, MD;  Location: [facility];  Service: General;  Laterality: Right;    Family History  Problem Relation Age of Onset  . Lung cancer Father   . Hypertension Father   . Thyroid cancer Father        dx in his 71s  . Cancer Father        lung, thyroid, smoker  . Breast cancer Paternal Aunt 39  . Colon cancer Paternal Aunt        dx in her 50x  . Cervical cancer Paternal Aunt        dzx in her 70s  . Ovarian cancer Cousin        dx in her lage 72s  . Breast cancer Cousin        maternal first cousin, once removed; dx in her late 8s  . Breast cancer Cousin        maternal first cousin once removed; dx in late 58s  . Hypertension Mother   . Diabetes Mother   . Dementia Mother   . Memory loss Mother   . Hypertension Brother   . Seizures Brother        Alcohol induced.  . Alcohol abuse Brother        drinker, smoker  . Cancer Paternal Uncle        oral cancer  . Kidney cancer Paternal Grandmother   . Arthritis Daughter        back surgery  . Cancer Cousin        several paternal cousins with brain cancer, leukemia, and other cancers  . Cancer Sister        stomach    Social history:  reports that she has never smoked. She has never used smokeless tobacco. She reports that she does not drink alcohol or use drugs.  Medications:  Prior to Admission medications   Medication Sig Start Date End Date Taking? Authorizing Provider  amitriptyline (ELAVIL) 100 MG tablet Take 100 mg by mouth at bedtime. 11/16/16  Yes [provider]  anastrozole (ARIMIDEX) 1 MG  tablet TAKE 1 TABLET (1 MG TOTAL) BY MOUTH DAILY. TAKE 1 TABLET (1 MG) BY MOUTH DAILY IN THE MORNING 10/21/19  Yes Nicholas Lose, MD  Apoaequorin (PREVAGEN PO) Take by mouth.   Yes [provider]  aspirin EC 81 MG tablet Take 1 tablet (81 mg total) by mouth daily. 06/19/19  Yes Mosie Lukes, MD  hydrOXYzine (ATARAX/VISTARIL) 25 MG tablet Take 25 mg by mouth 2 (two) times daily. 25 to 75 mg daily 10/01/19  Yes [provider]  KLOR-CON M20 20 MEQ tablet  TAKE 1 TABLET BY MOUTH EVERY DAY Patient taking differently: Take 20 mEq by mouth daily.  05/16/19  Yes Mosie Lukes, MD  LATUDA 80 MG TABS tablet Take 80 mg by mouth at bedtime.  07/17/17  Yes [provider]  Multiple Vitamin (MULTIVITAMIN WITH MINERALS) TABS tablet Take 1 tablet by mouth daily.   Yes [provider]  POTASSIUM CHLORIDE PO Take 20 mEq by mouth.   Yes [provider]  valsartan-hydrochlorothiazide (DIOVAN-HCT) 320-25 MG tablet TAKE 1 TABLET BY MOUTH EVERY DAY IN THE MORNING Patient taking differently: Take 1 tablet by mouth daily.  07/31/19  Yes Mosie Lukes, MD  prochlorperazine (COMPAZINE) 10 MG tablet Take 1 tablet (10 mg total) by mouth every 6 (six) hours as needed (Nausea or vomiting). 05/07/13 12/08/19 Yes Causey, Charlestine Massed, NP  Docusate Sodium (COLACE PO) Take by mouth.    [provider]  lidocaine-prilocaine (EMLA) cream APPLY EXTERNALLY TO THE AFFECTED AREA TWICE DAILY AS DIRECTED 06/19/19   Mosie Lukes, MD  prochlorperazine (COMPAZINE) 25 MG suppository Place 1 suppository (25 mg total) rectally every 12 (twelve) hours as needed for nausea. 05/07/13 07/24/13  Gardenia Phlegm, NP     No Known Allergies  ROS:  Out of a complete 14 system review of symptoms, the patient complains only of the following symptoms, and all other reviewed systems are negative.  Memory troubles Walking difficulty Difficulty dropping things Dizziness Anxiety, panic  attacks  Blood pressure (!) 145/92, pulse 98, temperature (!) 96.1 F (35.6 C), height 5\' 2"  (1.575 m), weight 165 lb (74.8 kg), last menstrual period 01/20/2013.  Physical Exam  General: The patient is alert and cooperative at the time of the examination.  Eyes: Pupils are equal, round, and reactive to light. Discs are flat bilaterally.  Neck: The neck is supple, no carotid bruits are noted.  Respiratory: The respiratory examination is clear.  Cardiovascular: The cardiovascular examination reveals a regular rate and rhythm, no obvious murmurs or rubs are noted.  Skin: Extremities are without significant edema.  Neurologic Exam  Mental status: The patient is alert and oriented x 3 at the time of the examination. The Mini-Mental status examination done today shows a total score 24/30.  Cranial nerves: Facial symmetry is present. There is good sensation of the face to pinprick and soft touch bilaterally. The strength of the facial muscles and the muscles to head turning and shoulder shrug are normal bilaterally. Speech is well enunciated, no aphasia or dysarthria is noted. Extraocular movements are full. Visual fields are full. The tongue is midline, and the patient has symmetric elevation of the soft palate. No obvious hearing deficits are noted.  Motor: The motor testing reveals 5 over 5 strength of all 4 extremities. Good symmetric motor tone is noted throughout.  Sensory: Sensory testing is intact to pinprick, soft touch, vibration sensation, and position sense on all 4 extremities. No evidence of extinction is noted.  Coordination: Cerebellar testing reveals good finger-nose-finger and heel-to-shin bilaterally.  The patient has slight apraxia with use of the arms and legs at times.  Gait and station: Gait is normal. Tandem gait is slightly unsteady. Romberg is negative. No drift is seen.  Reflexes: Deep tendon reflexes are symmetric and normal bilaterally. Toes are downgoing  bilaterally.   MRI brain 10/08/19:  IMPRESSION: No acute intracranial abnormality.  Stable since 2014 and normal for age MRI appearance of the brain - the subtle right frontal lobe hyperdensity on plain head CT  earlier tonight was artifact.  * MRI scan images were reviewed online. I agree with the written report.    CTA head and neck 10/07/19:   IMPRESSION: 1. Essentially normal for age CTA head and neck; there is soft plaque at the Left ECA origin but minimal plaque otherwise and no arterial stenosis.  2. Subtle asymmetric hyperdensity on the non-contrast Head CT in the right frontal lobe subcortical white matter (series 7, image 32). No associated edema or mass effect, and no correlation on today's CTA, or the 2014 MRI.    MRI cervical 10/08/19:  IMPRESSION: 1. Advanced cervical disc and endplate degeneration with multilevel mild spinal stenosis.  2. Up to mild associated spinal cord mass effect, and there is subsequent Cervical Cord Myelomalacia suspected - most pronounced at C6-C7. 3. Associated moderate or severe neural foraminal stenosis at the right C4, left C5, bilateral C6 and C7 nerve levels.  * MRI scan images were reviewed online. I agree with the written report.     Assessment/Plan:  1.  Reported memory disturbance  2.  Gait disturbance  3.  Cervical spinal stenosis, myelomalacia  4.  Anxiety/panic disorder  5.  History of peripheral neuropathy on gabapentin  The patient is having some issues with memory and concentration that may in part be related to use of medication and to her underlying anxiety disorder.  She has been taken off of clonazepam, she remains on amitriptyline 100 mg at night and gabapentin 800 mg twice daily.  She reports no pain from her neuropathy.  It is possible that the gabapentin dose may be gradually reduced to a much lower level and possibly discontinued.  Amitriptyline has strong anticholinergic side effects, decreased  dose and cessation of this medication may improve her cognitive functioning.  Given her severe anxiety disorder, we will send her for neuropsychological evaluation to determine how much this is impacting her ability to function cognitively.  Her husband indicates that when she is extremely anxious that her cognitive abilities declined significantly.  She will follow-up here in 6 months.  Blood work will be done today.  Jill Alexanders MD 12/08/2019 9:58 AM  [facility] [address] [place], [place] [address]  Phone [phone] Fax [phone]

## 2019-12-10 LAB — COPPER, SERUM: Copper: 168 ug/dL — ABNORMAL HIGH (ref 80–158)

## 2019-12-10 LAB — ANA W/REFLEX: Anti Nuclear Antibody (ANA): NEGATIVE

## 2019-12-10 LAB — RPR: RPR Ser Ql: NONREACTIVE

## 2019-12-10 LAB — VITAMIN B12: Vitamin B-12: 1759 pg/mL — ABNORMAL HIGH (ref 232–1245)

## 2019-12-10 LAB — SEDIMENTATION RATE: Sed Rate: 20 mm/hr (ref 0–40)

## 2019-12-15 ENCOUNTER — Other Ambulatory Visit: Payer: Self-pay | Admitting: Family Medicine

## 2019-12-17 DIAGNOSIS — I10 Essential (primary) hypertension: Secondary | ICD-10-CM

## 2019-12-17 DIAGNOSIS — R269 Unspecified abnormalities of gait and mobility: Secondary | ICD-10-CM

## 2019-12-17 DIAGNOSIS — R2681 Unsteadiness on feet: Secondary | ICD-10-CM

## 2019-12-17 DIAGNOSIS — Z6830 Body mass index (BMI) 30.0-30.9, adult: Secondary | ICD-10-CM

## 2019-12-17 DIAGNOSIS — M4802 Spinal stenosis, cervical region: Secondary | ICD-10-CM

## 2019-12-17 HISTORY — DX: Spinal stenosis, cervical region: M48.02

## 2019-12-17 HISTORY — DX: Unspecified abnormalities of gait and mobility: R26.9

## 2019-12-17 HISTORY — DX: Unsteadiness on feet: R26.81

## 2019-12-17 HISTORY — DX: Body mass index (BMI) 30.0-30.9, adult: Z68.30

## 2019-12-17 HISTORY — DX: Essential (primary) hypertension: I10

## 2019-12-19 ENCOUNTER — Encounter: Payer: Self-pay | Admitting: Family Medicine

## 2019-12-19 ENCOUNTER — Other Ambulatory Visit: Payer: Self-pay | Admitting: Family Medicine

## 2019-12-19 MED ORDER — GABAPENTIN 600 MG PO TABS: 600.0000 mg | ORAL_TABLET | Freq: Three times a day (TID) | ORAL | 0 refills | Status: DC

## 2019-12-22 ENCOUNTER — Ambulatory Visit: Payer: Medicare Other | Admitting: Family Medicine

## 2019-12-30 DIAGNOSIS — R296 Repeated falls: Secondary | ICD-10-CM | POA: Diagnosis not present

## 2019-12-30 DIAGNOSIS — M25562 Pain in left knee: Secondary | ICD-10-CM | POA: Diagnosis not present

## 2019-12-30 DIAGNOSIS — M25561 Pain in right knee: Secondary | ICD-10-CM | POA: Diagnosis not present

## 2020-01-06 DIAGNOSIS — M25562 Pain in left knee: Secondary | ICD-10-CM | POA: Diagnosis not present

## 2020-01-06 DIAGNOSIS — R296 Repeated falls: Secondary | ICD-10-CM | POA: Diagnosis not present

## 2020-01-06 DIAGNOSIS — M25561 Pain in right knee: Secondary | ICD-10-CM | POA: Diagnosis not present

## 2020-01-08 DIAGNOSIS — M25561 Pain in right knee: Secondary | ICD-10-CM | POA: Diagnosis not present

## 2020-01-08 DIAGNOSIS — R296 Repeated falls: Secondary | ICD-10-CM | POA: Diagnosis not present

## 2020-01-08 DIAGNOSIS — M25562 Pain in left knee: Secondary | ICD-10-CM | POA: Diagnosis not present

## 2020-01-12 ENCOUNTER — Other Ambulatory Visit: Payer: Self-pay | Admitting: Family Medicine

## 2020-01-13 DIAGNOSIS — M25562 Pain in left knee: Secondary | ICD-10-CM | POA: Diagnosis not present

## 2020-01-13 DIAGNOSIS — M25561 Pain in right knee: Secondary | ICD-10-CM | POA: Diagnosis not present

## 2020-01-13 DIAGNOSIS — R296 Repeated falls: Secondary | ICD-10-CM | POA: Diagnosis not present

## 2020-01-15 DIAGNOSIS — M25562 Pain in left knee: Secondary | ICD-10-CM | POA: Diagnosis not present

## 2020-01-15 DIAGNOSIS — M25561 Pain in right knee: Secondary | ICD-10-CM | POA: Diagnosis not present

## 2020-01-15 DIAGNOSIS — R296 Repeated falls: Secondary | ICD-10-CM | POA: Diagnosis not present

## 2020-01-16 ENCOUNTER — Encounter: Payer: Self-pay | Admitting: Family Medicine

## 2020-01-16 ENCOUNTER — Ambulatory Visit (INDEPENDENT_AMBULATORY_CARE_PROVIDER_SITE_OTHER): Payer: Medicare Other | Admitting: Family Medicine

## 2020-01-16 ENCOUNTER — Other Ambulatory Visit: Payer: Self-pay

## 2020-01-16 VITALS — BP 133/87 | Wt 165.0 lb

## 2020-01-16 DIAGNOSIS — R739 Hyperglycemia, unspecified: Secondary | ICD-10-CM | POA: Diagnosis not present

## 2020-01-16 DIAGNOSIS — G3184 Mild cognitive impairment, so stated: Secondary | ICD-10-CM

## 2020-01-16 DIAGNOSIS — E785 Hyperlipidemia, unspecified: Secondary | ICD-10-CM

## 2020-01-16 DIAGNOSIS — R5381 Other malaise: Secondary | ICD-10-CM | POA: Diagnosis not present

## 2020-01-16 DIAGNOSIS — I10 Essential (primary) hypertension: Secondary | ICD-10-CM | POA: Diagnosis not present

## 2020-01-16 DIAGNOSIS — R42 Dizziness and giddiness: Secondary | ICD-10-CM

## 2020-01-16 NOTE — Progress Notes (Signed)
[phone]  

## 2020-01-18 DIAGNOSIS — R42 Dizziness and giddiness: Secondary | ICD-10-CM | POA: Insufficient documentation

## 2020-01-18 DIAGNOSIS — R5381 Other malaise: Secondary | ICD-10-CM | POA: Insufficient documentation

## 2020-01-18 HISTORY — DX: Other malaise: R53.81

## 2020-01-18 NOTE — Assessment & Plan Note (Signed)
She is doing well during the pandemic and has not had a major progression

## 2020-01-18 NOTE — Assessment & Plan Note (Signed)
hgba1c acceptable, minimize simple carbs. Increase exercise as tolerated.  

## 2020-01-18 NOTE — Assessment & Plan Note (Signed)
Encouraged heart healthy diet, increase exercise, avoid trans fats, consider a krill oil cap daily 

## 2020-01-18 NOTE — Assessment & Plan Note (Signed)
Well controlled, no changes to meds. Encouraged heart healthy diet such as the DASH diet and exercise as tolerated.  °

## 2020-01-18 NOTE — Assessment & Plan Note (Signed)
Patient is working with physical therapy and is feeling some stronger. She is working on [employer] operational [employer].

## 2020-01-18 NOTE — Assessment & Plan Note (Signed)
Likely dehydration plays somewhat of a role. She is encouraged to increase hydration and she is advised to arise more slowly as well. Report if worsens.

## 2020-01-18 NOTE — Progress Notes (Signed)
Patient ID: Kristi [name], female   DOB: 02-21-1956, 64 y.o.   MRN: [id number] Virtual Visit via phone Note  I connected with Kristi [name] on 01/16/20 at 10:40 AM EST by a phone enabled telemedicine application and verified that I am speaking with the correct person using two identifiers.  Location: Patient: home Provider: home   I discussed the limitations of evaluation and management by telemedicine and the availability of in person appointments. The patient expressed understanding and agreed to proceed. Kristi [name], CMA was able to set the patient up with a visit,    Subjective:    Patient ID: Kristi [name], female    DOB: 1956-04-10, 64 y.o.   MRN: [id number]  No chief complaint on file.   HPI Patient is in today for follow up on chronic medical concerns. No recent febrile illness or hospitalizations. She is undergoing physical therapy for her weakness and balance concerns. She feels it is helping. She Is staying home and quarantining well during the pandemic. She notes some sensation of feeling light headed up first arising. It resolves quickly and she has not had any falls or injury as a result. Denies CP/palp/SOB/HA/congestion/fevers/GI or GU c/o. Taking meds as prescribed  Past Medical History:  Diagnosis Date  . Anemia    Iron deficinecy anemia  . Anemia 05/14/2017  . Anxiety   . Anxiety and depression 05/15/2014  . Arthritis   . Breast cancer ([place])    left ,last radiation 2'15, last chemo 8'14  . Constipation 11/13/2016  . Depression   . History of chicken pox   . History of radiation therapy 09/09/13-10/28/13   45 gray to left breast, lumpectomy cavity boosted to 63 gray  . HTN (hypertension) 11/13/2016  . Hyperglycemia 01/09/2016  . Hyperlipidemia 05/14/2017  . Hypertension   . MRSA (methicillin resistant Staphylococcus aureus) 2009   right groin area-no issues now. 04-07-14 PCR screen negative today.  . Neuropathy   . Preventative health care 11/13/2016    Past  Surgical History:  Procedure Laterality Date  . ANAL SPHINCTEROTOMY  04/2011  . APPENDECTOMY  1980  . AXILLARY LYMPH NODE DISSECTION Left 02/04/2013   Procedure: LEFT AXILLARY LYMPH NODE DISSECTION;  Surgeon: Stark Klein, MD;  Location: [facility];  Service: General;  Laterality: Left;  End: 8372  . BREAST LUMPECTOMY WITH NEEDLE LOCALIZATION Left 02/04/2013   Procedure: LEFT BREAST LUMPECTOMY WITH NEEDLE LOCALIZATION;  Surgeon: Stark Klein, MD;  Location: [facility];  Service: General;  Laterality: Left;  . BREAST SURGERY     Lumpectomy in april 2014  . HEMORRHOID SURGERY  04/2011   ligation  . MASTECTOMY Left 02/15/2017  . PORT-A-CATH REMOVAL N/A 04/16/2014   Procedure: REMOVAL PORT-A-CATH;  Surgeon: Stark Klein, MD;  Location: [facility];  Service: General;  Laterality: N/A;  . PORTACATH PLACEMENT Right 02/04/2013   Procedure: INSERTION PORT-A-CATH;  Surgeon: Stark Klein, MD;  Location: [facility];  Service: General;  Laterality: Right;  Start Time: 9021.  Marland Kitchen SHOULDER ARTHROSCOPY WITH ROTATOR CUFF REPAIR AND SUBACROMIAL DECOMPRESSION Left 02/24/2014   Procedure: SHOULDER ARTHROSCOPY WITH ROTATOR CUFF REPAIR AND SUBACROMIAL DECOMPRESSION;  Surgeon: Meredith Pel, MD;  Location: [facility];  Service: Orthopedics;  Laterality: Left;  LEFT SHOULDER DIAGNOSTIC OPERATIVE ARTHROSCOPY, SUBACROMIAL DECOMPRESSION, ROTATOR CUFF TEAR REPAIR  . SIMPLE MASTECTOMY WITH AXILLARY SENTINEL NODE BIOPSY Left 02/15/2017   Procedure: LEFT MASTECTOMY;  Surgeon: Stark Klein, MD;  Location: [facility];  Service: General;  Laterality: Left;  . TOTAL MASTECTOMY  Right 12/26/2018   Procedure: RIGHT BREAST PROPHYLATIC MASTECTOMY;  Surgeon: Stark Klein, MD;  Location: [facility];  Service: General;  Laterality: Right;    Family History  Problem Relation Age of Onset  . Lung cancer Father   . Hypertension Father   . Thyroid cancer Father        dx in his 67s  . Cancer Father        lung, thyroid, smoker  . Breast cancer Paternal Aunt 71  . Colon  cancer Paternal Aunt        dx in her 50x  . Cervical cancer Paternal Aunt        dzx in her 19s  . Ovarian cancer Cousin        dx in her lage 62s  . Breast cancer Cousin        maternal first cousin, once removed; dx in her late 25s  . Breast cancer Cousin        maternal first cousin once removed; dx in late 54s  . Hypertension Mother   . Diabetes Mother   . Dementia Mother   . Memory loss Mother   . Hypertension Brother   . Seizures Brother        Alcohol induced.  . Alcohol abuse Brother        drinker, smoker  . Cancer Paternal Uncle        oral cancer  . Kidney cancer Paternal Grandmother   . Arthritis Daughter        back surgery  . Cancer Cousin        several paternal cousins with brain cancer, leukemia, and other cancers  . Cancer Sister        stomach    Social History   Socioeconomic History  . Marital status: Married    Spouse name: [name]  . Number of children: 1  . Years of education: 29  . Highest education level: Not on file  Occupational History  . 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 Smoking status: Never Smoker  . Smokeless tobacco: Never Used  Substance and Sexual Activity  . Alcohol use: No  . Drug use: No  . Sexual activity: Yes    Comment: lives with husband, disability/retirement. [employer], no dietary restrictions  Other Topics Concern  . Not on file  Social History Narrative   Patient is married [name]) and lives at home with her husband.   Patient has one daughter.   Patient is working at [employer]   Patient has a 12th grade education.   Patient drinks very little caffeine.   Social Determinants of Health   Financial Resource Strain:   . Difficulty of Paying Living Expenses:   Food Insecurity:   . Worried About Charity fundraiser in the Last Year:   . Arboriculturist in the Last Year:   Transportation Needs:   . Film/video editor (Medical):   Marland Kitchen Lack of  Transportation (Non-Medical):   Physical Activity:   . Days of Exercise per Week:   . Minutes of Exercise per Session:   Stress:   . Feeling of Stress :   Social Connections:   . Frequency of Communication with Friends and Family:   . Frequency of Social Gatherings with Friends and Family:   . Attends Religious Services:   . Active Member of Clubs or Organizations:   . Attends Club or  Organization Meetings:   Marland Kitchen Marital Status:   Intimate Partner Violence:   . Fear of Current or Ex-Partner:   . Emotionally Abused:   Marland Kitchen Physically Abused:   . Sexually Abused:     Outpatient Medications Prior to Visit  Medication Sig Dispense Refill  . amitriptyline (ELAVIL) 100 MG tablet Take 100 mg by mouth at bedtime.  2  . anastrozole (ARIMIDEX) 1 MG tablet TAKE 1 TABLET (1 MG TOTAL) BY MOUTH DAILY. TAKE 1 TABLET (1 MG) BY MOUTH DAILY IN THE MORNING 90 tablet 3  . Apoaequorin (PREVAGEN PO) Take by mouth.    Marland Kitchen aspirin EC 81 MG tablet Take 1 tablet (81 mg total) by mouth daily.    Mariane Baumgarten Sodium (COLACE PO) Take by mouth.    . gabapentin (NEURONTIN) 600 MG tablet Take 1 tablet (600 mg total) by mouth 3 (three) times daily. 90 tablet 0  . hydrOXYzine (ATARAX/VISTARIL) 25 MG tablet Take 25 mg by mouth 2 (two) times daily. 25 to 75 mg daily    . KLOR-CON M20 20 MEQ tablet TAKE 1 TABLET BY MOUTH EVERY DAY (Patient taking differently: Take 20 mEq by mouth daily. ) 90 tablet 1  . LATUDA 80 MG TABS tablet Take 80 mg by mouth at bedtime.   1  . lidocaine-prilocaine (EMLA) cream APPLY EXTERNALLY TO THE AFFECTED AREA TWICE DAILY AS DIRECTED 30 g 3  . meloxicam (MOBIC) 7.5 MG tablet TAKE 1 TABLET BY MOUTH EVERY DAY AS NEEDED FOR PAIN 30 tablet 1  . Multiple Vitamin (MULTIVITAMIN WITH MINERALS) TABS tablet Take 1 tablet by mouth daily.    Marland Kitchen POTASSIUM CHLORIDE PO Take 20 mEq by mouth.    . valsartan-hydrochlorothiazide (DIOVAN-HCT) 320-25 MG tablet TAKE 1 TABLET BY MOUTH EVERY DAY IN THE MORNING 90 tablet 1    No facility-administered medications prior to visit.    No Known Allergies  Review of Systems  Constitutional: Positive for malaise/fatigue. Negative for fever.  HENT: Negative for congestion.   Eyes: Negative for blurred vision.  Respiratory: Negative for shortness of breath.   Cardiovascular: Negative for chest pain, palpitations and leg swelling.  Gastrointestinal: Negative for abdominal pain, blood in stool and nausea.  Genitourinary: Negative for dysuria and frequency.  Musculoskeletal: Negative for falls.  Skin: Negative for rash.  Neurological: Positive for dizziness and weakness. Negative for loss of consciousness and headaches.  Endo/Heme/Allergies: Negative for environmental allergies.  Psychiatric/Behavioral: Positive for memory loss. Negative for depression. The patient is not nervous/anxious.        Objective:    Physical Exam  Unable to obtain via phone visit.  BP 133/87 (BP Location: Left Arm, Patient Position: Sitting, Cuff Size: Normal)   Wt 165 lb (74.8 kg)   LMP 01/20/2013   BMI 30.18 kg/m  Wt Readings from Last 3 Encounters:  01/16/20 165 lb (74.8 kg)  12/08/19 165 lb (74.8 kg)  10/07/19 176 lb (79.8 kg)    Diabetic Foot Exam - Simple   No data filed     Lab Results  Component Value Date   WBC 6.4 10/07/2019   HGB 11.8 (L) 10/07/2019   HCT 37.3 10/07/2019   PLT 186 10/07/2019   GLUCOSE 112 (H) 10/07/2019   CHOL 170 06/19/2019   TRIG 102.0 06/19/2019   HDL 47.10 06/19/2019   LDLCALC 103 (H) 06/19/2019   ALT 26 10/07/2019   AST 27 10/07/2019   NA 139 10/07/2019   K 4.0 10/07/2019   CL 104 10/07/2019  CREATININE 1.18 (H) 10/07/2019   BUN 10 10/07/2019   CO2 27 10/07/2019   TSH 1.29 06/19/2019   INR 1.02 07/19/2013   HGBA1C 5.9 06/19/2019    Lab Results  Component Value Date   TSH 1.29 06/19/2019   Lab Results  Component Value Date   WBC 6.4 10/07/2019   HGB 11.8 (L) 10/07/2019   HCT 37.3 10/07/2019   MCV 87.8 10/07/2019    PLT 186 10/07/2019   Lab Results  Component Value Date   NA 139 10/07/2019   K 4.0 10/07/2019   CHLORIDE 104 01/22/2017   CO2 27 10/07/2019   GLUCOSE 112 (H) 10/07/2019   BUN 10 10/07/2019   CREATININE 1.18 (H) 10/07/2019   BILITOT 0.5 10/07/2019   ALKPHOS 92 10/07/2019   AST 27 10/07/2019   ALT 26 10/07/2019   PROT 7.6 10/07/2019   ALBUMIN 4.0 10/07/2019   CALCIUM 10.0 10/07/2019   ANIONGAP 8 10/07/2019   EGFR 67 (L) 01/22/2017   GFR 57.41 (L) 06/19/2019   Lab Results  Component Value Date   CHOL 170 06/19/2019   Lab Results  Component Value Date   HDL 47.10 06/19/2019   Lab Results  Component Value Date   LDLCALC 103 (H) 06/19/2019   Lab Results  Component Value Date   TRIG 102.0 06/19/2019   Lab Results  Component Value Date   CHOLHDL 4 06/19/2019   Lab Results  Component Value Date   HGBA1C 5.9 06/19/2019       Assessment & Plan:   Problem List Items Addressed This Visit    Hyperglycemia    hgba1c acceptable, minimize simple carbs. Increase exercise as tolerated.       Relevant Orders   Hemoglobin A1c   HTN (hypertension) - Primary    Well controlled, no changes to meds. Encouraged heart healthy diet such as the DASH diet and exercise as tolerated.       Relevant Orders   CBC   Comprehensive metabolic panel   TSH   Hyperlipidemia    Encouraged heart healthy diet, increase exercise, avoid trans fats, consider a krill oil cap daily      Relevant Orders   Lipid panel   Mild cognitive impairment    She is doing well during the pandemic and has not had a major progression      Debility    Patient is working with physical therapy and is feeling some stronger. She is working on Armed forces operational officer.       Light headed    Likely dehydration plays somewhat of a role. She is encouraged to increase hydration and she is advised to arise more slowly as well. Report if worsens.          I am having Kristi [name] "Clarene Critchley" maintain her  amitriptyline, multivitamin with minerals, Latuda, Klor-Con M20, lidocaine-prilocaine, aspirin EC, hydrOXYzine, anastrozole, POTASSIUM CHLORIDE PO, Docusate Sodium (COLACE PO), Apoaequorin (PREVAGEN PO), valsartan-hydrochlorothiazide, gabapentin, and meloxicam.  No orders of the defined types were placed in this encounter.    I discussed the assessment and treatment plan with the patient. The patient was provided an opportunity to ask questions and all were answered. The patient agreed with the plan and demonstrated an understanding of the instructions.   The patient was advised to call back or seek an in-person evaluation if the symptoms worsen or if the condition fails to improve as anticipated.  I provided 25 minutes of non-face-to-face time during this encounter.  [name], MD

## 2020-01-20 DIAGNOSIS — R296 Repeated falls: Secondary | ICD-10-CM | POA: Diagnosis not present

## 2020-01-20 DIAGNOSIS — M25562 Pain in left knee: Secondary | ICD-10-CM | POA: Diagnosis not present

## 2020-01-20 DIAGNOSIS — M25561 Pain in right knee: Secondary | ICD-10-CM | POA: Diagnosis not present

## 2020-01-21 ENCOUNTER — Other Ambulatory Visit: Payer: Self-pay

## 2020-01-21 ENCOUNTER — Other Ambulatory Visit (INDEPENDENT_AMBULATORY_CARE_PROVIDER_SITE_OTHER): Payer: Medicare Other

## 2020-01-21 DIAGNOSIS — I10 Essential (primary) hypertension: Secondary | ICD-10-CM | POA: Diagnosis not present

## 2020-01-21 DIAGNOSIS — R739 Hyperglycemia, unspecified: Secondary | ICD-10-CM | POA: Diagnosis not present

## 2020-01-21 DIAGNOSIS — E785 Hyperlipidemia, unspecified: Secondary | ICD-10-CM | POA: Diagnosis not present

## 2020-01-21 LAB — COMPREHENSIVE METABOLIC PANEL
ALT: 17 U/L (ref 0–35)
AST: 20 U/L (ref 0–37)
Albumin: 3.9 g/dL (ref 3.5–5.2)
Alkaline Phosphatase: 99 U/L (ref 39–117)
BUN: 11 mg/dL (ref 6–23)
CO2: 31 mEq/L (ref 19–32)
Calcium: 9.7 mg/dL (ref 8.4–10.5)
Chloride: 103 mEq/L (ref 96–112)
Creatinine, Ser: 1.15 mg/dL (ref 0.40–1.20)
GFR: 57.3 mL/min — ABNORMAL LOW (ref >60.00)
Glucose, Bld: 119 mg/dL — ABNORMAL HIGH (ref 70–99)
Potassium: 3.8 mEq/L (ref 3.5–5.1)
Sodium: 140 mEq/L (ref 135–145)
Total Bilirubin: 0.5 mg/dL (ref 0.2–1.2)
Total Protein: 6.7 g/dL (ref 6.0–8.3)

## 2020-01-21 LAB — LIPID PANEL
Cholesterol: 176 mg/dL (ref 0–200)
HDL: 47.9 mg/dL (ref >39.00)
LDL Cholesterol: 103 mg/dL — ABNORMAL HIGH (ref 0–99)
NonHDL: 128.25
Total CHOL/HDL Ratio: 4
Triglycerides: 124 mg/dL (ref 0.0–149.0)
VLDL: 24.8 mg/dL (ref 0.0–40.0)

## 2020-01-21 LAB — CBC
HCT: 35.3 % — ABNORMAL LOW (ref 36.0–46.0)
Hemoglobin: 11.5 g/dL — ABNORMAL LOW (ref 12.0–15.0)
MCHC: 32.6 g/dL (ref 30.0–36.0)
MCV: 86.3 fl (ref 78.0–100.0)
Platelets: 181 10*3/uL (ref 150.0–400.0)
RBC: 4.09 Mil/uL (ref 3.87–5.11)
RDW: 14.2 % (ref 11.5–15.5)
WBC: 5 10*3/uL (ref 4.0–10.5)

## 2020-01-21 LAB — TSH: TSH: 1.32 u[IU]/mL (ref 0.35–4.50)

## 2020-01-21 LAB — HEMOGLOBIN A1C: Hgb A1c MFr Bld: 6 % (ref 4.6–6.5)

## 2020-01-22 DIAGNOSIS — R296 Repeated falls: Secondary | ICD-10-CM | POA: Diagnosis not present

## 2020-01-22 DIAGNOSIS — M25561 Pain in right knee: Secondary | ICD-10-CM | POA: Diagnosis not present

## 2020-01-22 DIAGNOSIS — M25562 Pain in left knee: Secondary | ICD-10-CM | POA: Diagnosis not present

## 2020-01-27 DIAGNOSIS — M25561 Pain in right knee: Secondary | ICD-10-CM | POA: Diagnosis not present

## 2020-01-27 DIAGNOSIS — R296 Repeated falls: Secondary | ICD-10-CM | POA: Diagnosis not present

## 2020-01-27 DIAGNOSIS — M25562 Pain in left knee: Secondary | ICD-10-CM | POA: Diagnosis not present

## 2020-01-29 DIAGNOSIS — R296 Repeated falls: Secondary | ICD-10-CM | POA: Diagnosis not present

## 2020-01-29 DIAGNOSIS — M25562 Pain in left knee: Secondary | ICD-10-CM | POA: Diagnosis not present

## 2020-01-29 DIAGNOSIS — M25561 Pain in right knee: Secondary | ICD-10-CM | POA: Diagnosis not present

## 2020-01-30 ENCOUNTER — Ambulatory Visit: Payer: Medicare Other | Attending: Internal Medicine

## 2020-01-30 DIAGNOSIS — Z23 Encounter for immunization: Secondary | ICD-10-CM

## 2020-01-30 NOTE — Progress Notes (Signed)
   Covid-19 Vaccination Clinic  Name:  Kristi [name]    MRN: [id number] DOB: 01-Mar-1956  01/30/2020  [name] was observed post Covid-19 immunization for 15 minutes without incident. She was provided with Vaccine Information Sheet and instruction to access the V-Safe system.   [name] was instructed to call 911 with any severe reactions post vaccine: Marland Kitchen Difficulty breathing  . Swelling of face and throat  . A fast heartbeat  . A bad rash all over body  . Dizziness and weakness   Immunizations Administered    Name Date Dose VIS Date Route   Pfizer COVID-19 Vaccine 01/30/2020 12:37 PM 0.3 mL 10/17/2019 Intramuscular   Manufacturer: [place]   Lot: [id number]   [place]: [id number]

## 2020-02-03 DIAGNOSIS — M25562 Pain in left knee: Secondary | ICD-10-CM | POA: Diagnosis not present

## 2020-02-03 DIAGNOSIS — M25561 Pain in right knee: Secondary | ICD-10-CM | POA: Diagnosis not present

## 2020-02-03 DIAGNOSIS — R296 Repeated falls: Secondary | ICD-10-CM | POA: Diagnosis not present

## 2020-02-05 DIAGNOSIS — R296 Repeated falls: Secondary | ICD-10-CM | POA: Diagnosis not present

## 2020-02-05 DIAGNOSIS — M25561 Pain in right knee: Secondary | ICD-10-CM | POA: Diagnosis not present

## 2020-02-05 DIAGNOSIS — M25562 Pain in left knee: Secondary | ICD-10-CM | POA: Diagnosis not present

## 2020-02-10 DIAGNOSIS — M25562 Pain in left knee: Secondary | ICD-10-CM | POA: Diagnosis not present

## 2020-02-10 DIAGNOSIS — R296 Repeated falls: Secondary | ICD-10-CM | POA: Diagnosis not present

## 2020-02-10 DIAGNOSIS — M25561 Pain in right knee: Secondary | ICD-10-CM | POA: Diagnosis not present

## 2020-02-12 ENCOUNTER — Other Ambulatory Visit: Payer: Self-pay | Admitting: Family Medicine

## 2020-02-17 DIAGNOSIS — M25562 Pain in left knee: Secondary | ICD-10-CM | POA: Diagnosis not present

## 2020-02-17 DIAGNOSIS — R296 Repeated falls: Secondary | ICD-10-CM | POA: Diagnosis not present

## 2020-02-17 DIAGNOSIS — M25561 Pain in right knee: Secondary | ICD-10-CM | POA: Diagnosis not present

## 2020-02-24 ENCOUNTER — Ambulatory Visit: Payer: Medicare Other | Attending: Internal Medicine

## 2020-02-24 DIAGNOSIS — Z23 Encounter for immunization: Secondary | ICD-10-CM

## 2020-02-24 NOTE — Progress Notes (Signed)
   Covid-19 Vaccination Clinic  Name:  Kristi [name]    MRN: [id number] DOB: [birth year]  02/24/2020  [name] was observed post Covid-19 immunization for 15 minutes without incident. She was provided with Vaccine Information Sheet and instruction to access the V-Safe system.   [name] was instructed to call 911 with any severe reactions post vaccine: Marland Kitchen Difficulty breathing  . Swelling of face and throat  . A fast heartbeat  . A bad rash all over body  . Dizziness and weakness   Immunizations Administered    Name Date Dose VIS Date Route   Pfizer COVID-19 Vaccine 02/24/2020  8:47 AM 0.3 mL 12/31/2018 Intramuscular   Manufacturer: [place]   Lot: [id number]   [place]: [id number]

## 2020-02-26 DIAGNOSIS — R296 Repeated falls: Secondary | ICD-10-CM | POA: Diagnosis not present

## 2020-02-26 DIAGNOSIS — M25561 Pain in right knee: Secondary | ICD-10-CM | POA: Diagnosis not present

## 2020-02-26 DIAGNOSIS — M25562 Pain in left knee: Secondary | ICD-10-CM | POA: Diagnosis not present

## 2020-03-09 ENCOUNTER — Other Ambulatory Visit: Payer: Self-pay | Admitting: Family Medicine

## 2020-03-15 ENCOUNTER — Other Ambulatory Visit: Payer: Self-pay | Admitting: Family Medicine

## 2020-03-15 ENCOUNTER — Encounter: Payer: Self-pay | Admitting: Family Medicine

## 2020-03-15 MED ORDER — HYDROCODONE-ACETAMINOPHEN 5-325 MG PO TABS: 1.0000 | ORAL_TABLET | Freq: Four times a day (QID) | ORAL | 0 refills | Status: DC | PRN

## 2020-03-16 ENCOUNTER — Other Ambulatory Visit: Payer: Self-pay | Admitting: Family Medicine

## 2020-03-17 DIAGNOSIS — G3184 Mild cognitive impairment, so stated: Secondary | ICD-10-CM | POA: Insufficient documentation

## 2020-03-17 DIAGNOSIS — F411 Generalized anxiety disorder: Secondary | ICD-10-CM

## 2020-03-17 DIAGNOSIS — F0670 Mild neurocognitive disorder due to known physiological condition without behavioral disturbance: Secondary | ICD-10-CM | POA: Insufficient documentation

## 2020-03-17 HISTORY — DX: Mild neurocognitive disorder due to known physiological condition without behavioral disturbance: F06.70

## 2020-03-17 HISTORY — DX: Mild cognitive impairment of uncertain or unknown etiology: G31.84

## 2020-03-31 ENCOUNTER — Other Ambulatory Visit: Payer: Self-pay | Admitting: Family Medicine

## 2020-03-31 ENCOUNTER — Encounter: Payer: Self-pay | Admitting: Family Medicine

## 2020-04-07 ENCOUNTER — Encounter: Payer: Self-pay | Admitting: Family Medicine

## 2020-04-08 ENCOUNTER — Encounter: Payer: Self-pay | Admitting: Family Medicine

## 2020-04-09 ENCOUNTER — Other Ambulatory Visit: Payer: Self-pay

## 2020-04-09 ENCOUNTER — Other Ambulatory Visit: Payer: Self-pay | Admitting: Medical

## 2020-04-09 ENCOUNTER — Encounter: Payer: Self-pay | Admitting: Medical

## 2020-04-09 ENCOUNTER — Ambulatory Visit (HOSPITAL_BASED_OUTPATIENT_CLINIC_OR_DEPARTMENT_OTHER)
Admission: RE | Admit: 2020-04-09 | Discharge: 2020-04-09 | Disposition: A | Discharge status: Home / Self Care | Payer: Medicare Other | Source: Ambulatory Visit | Attending: Medical | Admitting: Medical

## 2020-04-09 ENCOUNTER — Ambulatory Visit (INDEPENDENT_AMBULATORY_CARE_PROVIDER_SITE_OTHER): Payer: Medicare Other | Admitting: Medical

## 2020-04-09 VITALS — BP 103/52 | HR 59 | Resp 18 | Ht 62.0 in | Wt 163.6 lb

## 2020-04-09 DIAGNOSIS — G8929 Other chronic pain: Secondary | ICD-10-CM

## 2020-04-09 DIAGNOSIS — M79671 Pain in right foot: Secondary | ICD-10-CM | POA: Diagnosis not present

## 2020-04-09 DIAGNOSIS — M25532 Pain in left wrist: Secondary | ICD-10-CM

## 2020-04-09 DIAGNOSIS — G629 Polyneuropathy, unspecified: Secondary | ICD-10-CM

## 2020-04-09 DIAGNOSIS — M19032 Primary osteoarthritis, left wrist: Secondary | ICD-10-CM | POA: Diagnosis not present

## 2020-04-09 DIAGNOSIS — M79672 Pain in left foot: Secondary | ICD-10-CM | POA: Diagnosis not present

## 2020-04-09 MED ORDER — HYDROCODONE-ACETAMINOPHEN 5-325 MG PO TABS: 1.0000 | ORAL_TABLET | Freq: Four times a day (QID) | ORAL | 0 refills | Status: DC | PRN

## 2020-04-09 NOTE — Progress Notes (Signed)
   Subjective:    Patient ID: Kristi [name], female    DOB: 1956/06/02, 64 y.o.   MRN: [id number]  HPI  Pt has bilateral feet pain. Burning and throbbing pain. Tingles a lot. Had pain for years. Pt is on gabapentin 600 mg three times a day for year. In past she states has gotten norco for severe flares. Pt states this week has been bad.   Pt has no diabetes and no back pain.   Pt also states one month of left wrist pain and some thumb pain. Pt is left handed. No pain in neck that shoot to her wrist.  Pt wants to consider pain management.    Review of Systems  Constitutional: Negative for chills, fatigue and fever.  Respiratory: Negative for cough, chest tightness, shortness of breath and wheezing.   Cardiovascular: Negative for chest pain and palpitations.  Gastrointestinal: Negative for abdominal pain.  Musculoskeletal:       Wrsit and thumb pain left side.  Lower ext feet pain.  Skin: Negative for rash.  Neurological: Negative for dizziness and headaches.       Hx of neuropathy.       Objective:   Physical Exam  General- No acute distress. Pleasant patient. Neck- Full range of motion, no jvd Lungs- Clear, even and unlabored. Heart- regular rate and rhythm. Neurologic- CNII- XII grossly intact. Lt wrist- mild pain palpation wrist in distal ulna area.  Left thumb-+finklestein test. No swelling or bruising.  Feet- both side no pain on palpation directly today.       Assessment & Plan:  For your chronic bilateral feet pain/neuropathy continue with gabapentin and will give limited rx of norco. Sent to physical medicine/pain management as you requested.  You have wrist and thumb pain. Some features of tenosynovitis. Continue to use splint you have. Physical medicine can give advise on this as well.  Follow up with pcp as regularly scheduled or as needed  Mackie Pai, PA-C   Time spent with patient today was 25 minutes which consisted of chart review, discussing  diagnosis, work up referral, treatment and documentation.

## 2020-04-09 NOTE — Patient Instructions (Signed)
For your chronic bilateral feet pain/neuropathy continue with gabapentin and will give limited rx of norco. Sent to physical medicine/pain management as you requested.  You have wrist and thumb pain. Some features of tenosynovitis. Continue to use splint you have. Physical medicine can give advise on this as well.  Follow up with pcp as regularly scheduled or as needed

## 2020-04-14 ENCOUNTER — Encounter: Payer: Self-pay | Admitting: Physical Medicine and Rehabilitation

## 2020-04-14 DIAGNOSIS — H43823 Vitreomacular adhesion, bilateral: Secondary | ICD-10-CM | POA: Diagnosis not present

## 2020-04-14 DIAGNOSIS — H25812 Combined forms of age-related cataract, left eye: Secondary | ICD-10-CM | POA: Diagnosis not present

## 2020-04-14 DIAGNOSIS — H25811 Combined forms of age-related cataract, right eye: Secondary | ICD-10-CM | POA: Diagnosis not present

## 2020-04-22 ENCOUNTER — Telehealth: Payer: Medicare Other | Admitting: Family Medicine

## 2020-04-23 ENCOUNTER — Encounter: Payer: Self-pay | Admitting: Family Medicine

## 2020-04-26 ENCOUNTER — Ambulatory Visit: Payer: Medicare Other | Admitting: Family Medicine

## 2020-04-26 MED ORDER — HYDROCODONE-ACETAMINOPHEN 5-325 MG PO TABS: 1.0000 | ORAL_TABLET | Freq: Four times a day (QID) | ORAL | 0 refills | Status: DC | PRN

## 2020-04-26 NOTE — Telephone Encounter (Signed)
Was forwarded pt message.  She was seen on June 4 by Percell Miller with concern of chronic bilateral foot pain and neuropathy She is on gabapentin, ever gave her some hydrocodone  04/09/2020  2   04/09/2020  Hydrocodone-Acetamin 5-325 MG  30.00  7 [address]   [id number]   [place] ([id number])   0  21.43 MME  Medicare   [place]  03/16/2020  2   03/15/2020  Hydrocodone-Acetamin 5-325 MG  30.00  3 [address]   [id number]   [place] ([id number])   0  50.00 MME  Medicare   [place]  09/18/2019  2   09/18/2019  Clonazepam 0.5 MG Tablet  7.00  14 [address]   [id number]   [place] ([id number])   0  0.50 LME  Private Pay   [place]  08/20/2019  2   08/20/2019  Clonazepam 0.5 MG Tablet  30.00  30 [address]   [id number]   [place] ([id number])   0  1.00 LME  Private Pay   [place]  07/23/2019  2   04/10/2019  Clonazepam 1 MG Tablet  45.00  30 [address] [place]   [id number]   [place] ([id number])   2  3.00 LME  Medicare   [place]  06/25/2019  2   04/10/2019  Clonazepam 1 MG Tablet  45.00  30 [address]   [id number]   [place] ([id number])   1  3.00 LME  Medicare   [place]  06/19/2019  2   06/19/2019  Hydrocodone-Acetamin 5-325 MG  30.00  4 [address]   [id number]   [place] ([id number])   0  37.50 MME  Medicare   [place]  06/10/2019  2   06/10/2019  Hydrocodone-Acetamin 5-325 MG  30.00  3 [address]   [id number]   [place] ([id number])   0  50.00 MME  Medicare   [place]  05/24/2019  2   04/10/2019  Clonazepam 1 MG Tablet  45.00  30 [address]   [id number]   [place] ([id number])   0  3.00 LME  Medicare   [place]

## 2020-05-03 ENCOUNTER — Other Ambulatory Visit: Payer: Self-pay | Admitting: Family Medicine

## 2020-05-05 ENCOUNTER — Encounter
Payer: Medicare Other | Attending: Physical Medicine and Rehabilitation | Admitting: Physical Medicine and Rehabilitation

## 2020-05-05 ENCOUNTER — Other Ambulatory Visit: Payer: Self-pay

## 2020-05-05 ENCOUNTER — Encounter: Payer: Self-pay | Admitting: Physical Medicine and Rehabilitation

## 2020-05-05 VITALS — BP 118/75 | HR 76 | Temp 99.1°F | Ht 62.0 in | Wt 163.2 lb

## 2020-05-05 DIAGNOSIS — Z5181 Encounter for therapeutic drug level monitoring: Secondary | ICD-10-CM | POA: Diagnosis not present

## 2020-05-05 DIAGNOSIS — G620 Drug-induced polyneuropathy: Secondary | ICD-10-CM | POA: Diagnosis not present

## 2020-05-05 DIAGNOSIS — T451X5A Adverse effect of antineoplastic and immunosuppressive drugs, initial encounter: Secondary | ICD-10-CM | POA: Insufficient documentation

## 2020-05-05 DIAGNOSIS — Z79891 Long term (current) use of opiate analgesic: Secondary | ICD-10-CM

## 2020-05-05 DIAGNOSIS — G894 Chronic pain syndrome: Secondary | ICD-10-CM

## 2020-05-05 MED ORDER — GABAPENTIN 800 MG PO TABS
800.0000 mg | ORAL_TABLET | Freq: Three times a day (TID) | ORAL | 3 refills | Status: DC
Start: 2020-05-05 — End: 2020-06-19

## 2020-05-05 MED ORDER — HYDROCODONE-ACETAMINOPHEN 5-325 MG PO TABS: 1.0000 | ORAL_TABLET | Freq: Four times a day (QID) | ORAL | 0 refills | Status: DC | PRN

## 2020-05-05 NOTE — Progress Notes (Signed)
Subjective:    Patient ID: Kristi [name], female    DOB: [birth year], 64 y.o.   MRN: [id number]  HPI  Kristi [name] is a 64 year old woman with breast cancer. She had 17 LN removed in her left arm. She went through [facility] and radiation. She is on Anastrazole. She has had neuropathy since 2015.   She has had neuropathy that has been progressively getting worse. She is taking gabapentin that used to help but it has stopped helping as much as it used to.  She is taking 600mg  TID. It does not make her too drowsy.   Cymbalta did not help. She was on Amitriptyline but this was stopped when she was having problems with memory. She tried Lyrica that made her feel weird.  She is taking Hydrocodone and would like [place] to take over all her pain management. She only has 4 pills left at this time.   She did PT for a couple of months for her balance and did find this to be helpful.   Pain Inventory Average Pain 8 Pain Right Now 8 My pain is constant, burning, tingling and aching  In the last 24 hours, has pain interfered with the following? General activity 7 Relation with others 6 Enjoyment of life 9 What TIME of day is your pain at its worst? All the time. Sleep (in general) Fair  Pain is worse with: walking, inactivity and standing Pain improves with: medication Relief from Meds: 7  Mobility walk without assistance how many minutes can you walk? 5 mins ability to climb steps?  yes do you drive?  yes Do you have any goals in this area?  yes  Function disabled: date disabled 2015 I need assistance with the following:  Needs no help at home.  Neuro/Psych tingling trouble walking depression anxiety  Prior Studies Any changes since last visit?  no New  Patient  Physicians involved in your care Any changes since last visit?  yes New Patient   Family History  Problem Relation Age of Onset  . Lung cancer Father   . Hypertension Father   . Thyroid cancer Father        dx in his  98s  . Cancer Father        lung, thyroid, smoker  . Breast cancer Paternal Aunt 39  . Colon cancer Paternal Aunt        dx in her 50x  . Cervical cancer Paternal Aunt        dzx in her 76s  . Ovarian cancer Cousin        dx in her lage 76s  . Breast cancer Cousin        maternal first cousin, once removed; dx in her late 13s  . Breast cancer Cousin        maternal first cousin once removed; dx in late 1s  . Hypertension Mother   . Diabetes Mother   . Dementia Mother   . Memory loss Mother   . Hypertension Brother   . Seizures Brother        Alcohol induced.  . Alcohol abuse Brother        drinker, smoker  . Cancer Paternal Uncle        oral cancer  . Kidney cancer Paternal Grandmother   . Arthritis Daughter        back surgery  . Cancer Cousin        several paternal cousins with brain cancer, leukemia,  and other cancers  . Cancer Sister        stomach   Social History   Socioeconomic History  . Marital status: Married    Spouse name: Kristi [name]  . Number of children: 1  . Years of education: 58  . Highest education level: Not on file  Occupational History  . 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 Smoking status: Never Smoker  . Smokeless tobacco: Never Used  Vaping Use  . Vaping Use: Never used  Substance and Sexual Activity  . Alcohol use: No  . Drug use: No  . Sexual activity: Yes    Comment: lives with husband, disability/retirement. [employer], no dietary restrictions  Other Topics Concern  . Not on file  Social History Narrative   Patient is married Kristi [name]) and lives at home with her husband.   Patient has one daughter.   Patient is working at [employer]   Patient has a 12th grade education.   Patient drinks very little caffeine.   Social Determinants of Health   Financial Resource Strain:   . Difficulty of Paying Living Expenses:   Food Insecurity:   . Worried About Charity fundraiser in the Last  Year:   . Arboriculturist in the Last Year:   Transportation Needs:   . Film/video editor (Medical):   Marland Kitchen Lack of Transportation (Non-Medical):   Physical Activity:   . Days of Exercise per Week:   . Minutes of Exercise per Session:   Stress:   . Feeling of Stress :   Social Connections:   . Frequency of Communication with Friends and Family:   . Frequency of Social Gatherings with Friends and Family:   . Attends Religious Services:   . Active Member of Clubs or Organizations:   . Attends Archivist Meetings:   Marland Kitchen Marital Status:    Past Surgical History:  Procedure Laterality Date  . ANAL SPHINCTEROTOMY  04/2011  . APPENDECTOMY  1980  . AXILLARY LYMPH NODE DISSECTION Left 02/04/2013   Procedure: LEFT AXILLARY LYMPH NODE DISSECTION;  Surgeon: Stark Klein, MD;  Location: [place];  Service: General;  Laterality: Left;  End: 2637  . BREAST LUMPECTOMY WITH NEEDLE LOCALIZATION Left 02/04/2013   Procedure: LEFT BREAST LUMPECTOMY WITH NEEDLE LOCALIZATION;  Surgeon: Stark Klein, MD;  Location: [place];  Service: General;  Laterality: Left;  . BREAST SURGERY     Lumpectomy in april 2014  . HEMORRHOID SURGERY  04/2011   ligation  . MASTECTOMY Left 02/15/2017  . PORT-A-CATH REMOVAL N/A 04/16/2014   Procedure: REMOVAL PORT-A-CATH;  Surgeon: Stark Klein, MD;  Location: [facility];  Service: General;  Laterality: N/A;  . PORTACATH PLACEMENT Right 02/04/2013   Procedure: INSERTION PORT-A-CATH;  Surgeon: Stark Klein, MD;  Location: [place];  Service: General;  Laterality: Right;  Start Time: 8588.  Marland Kitchen SHOULDER ARTHROSCOPY WITH ROTATOR CUFF REPAIR AND SUBACROMIAL DECOMPRESSION Left 02/24/2014   Procedure: SHOULDER ARTHROSCOPY WITH ROTATOR CUFF REPAIR AND SUBACROMIAL DECOMPRESSION;  Surgeon: Meredith Pel, MD;  Location: [place];  Service: Orthopedics;  Laterality: Left;  LEFT SHOULDER DIAGNOSTIC OPERATIVE ARTHROSCOPY, SUBACROMIAL DECOMPRESSION, ROTATOR CUFF TEAR REPAIR  . SIMPLE MASTECTOMY WITH  AXILLARY SENTINEL NODE BIOPSY Left 02/15/2017   Procedure: LEFT MASTECTOMY;  Surgeon: Stark Klein, MD;  Location: [place];  Service: General;  Laterality: Left;  . TOTAL MASTECTOMY Right 12/26/2018   Procedure: RIGHT BREAST PROPHYLATIC MASTECTOMY;  Surgeon: Stark Klein, MD;  Location: [place];  Service: General;  Laterality: Right;   Past Medical History:  Diagnosis Date  . Anemia    Iron deficinecy anemia  . Anemia 05/14/2017  . Anxiety   . Anxiety and depression 05/15/2014  . Arthritis   . Breast cancer ([place])    left ,last radiation 2'15, last chemo 8'14  . Constipation 11/13/2016  . Depression   . History of chicken pox   . History of radiation therapy 09/09/13-10/28/13   45 gray to left breast, lumpectomy cavity boosted to 63 gray  . HTN (hypertension) 11/13/2016  . Hyperglycemia 01/09/2016  . Hyperlipidemia 05/14/2017  . Hypertension   . MRSA (methicillin resistant Staphylococcus aureus) 2009   right groin area-no issues now. 04-07-14 PCR screen negative today.  . Neuropathy   . Preventative health care 11/13/2016   BP 118/75   Pulse 76   Temp 99.1 F (37.3 C)   Ht 5\' 2"  (1.575 m)   Wt 163 lb 3.2 oz (74 kg)   LMP 01/20/2013   SpO2 96%   BMI 29.85 kg/m   Opioid Risk Score:   Fall Risk Score:  `1  Depression screen PHQ 2/9  Depression screen [facility] 2/9 05/05/2020 05/02/2019 04/30/2018 10/12/2016  Decreased Interest 2 0 0 0  Down, Depressed, Hopeless 1 0 1 0  PHQ - 2 Score 3 0 1 0  Altered sleeping 2 - - -  Tired, decreased energy 3 - - -  Change in appetite 2 - - -  Feeling bad or failure about yourself  1 - - -  Trouble concentrating 1 - - -  Moving slowly or fidgety/restless 1 - - -  Suicidal thoughts 0 - - -  PHQ-9 Score 13 - - -  Some encounter information is confidential and restricted. Go to Review Flowsheets activity to see all data.  Some recent data might be hidden   Review of Systems  Constitutional: Negative.   HENT: Negative.   Eyes: Negative.   Respiratory:  Negative.   Gastrointestinal: Positive for constipation.  Endocrine: Negative.   Genitourinary: Negative.   Musculoskeletal: Positive for gait problem.  Allergic/Immunologic: Negative.   Neurological:       Tingling & Numbness in hands & feet  Psychiatric/Behavioral:       Anxiety, depression       Objective:   Physical Exam Gen: no distress, normal appearing HEENT: oral mucosa pink and moist, NCAT Cardio: Reg rate Chest: normal effort, normal rate of breathing Abd: soft, non-distended Ext: no edema Skin: intact Neuro: Alert and oriented x3 Musculoskeletal: 5/5 strength throughout. Feet not tender to palpation. Wearing comfortable shoes.  Psych: pleasant, normal affect    Assessment & Plan:  Mrs. Beldin is a 64 year old woman who presents with bilateral foot painful neuropathy since starting Anastrazole chemotherapy for breast cancer in 2015.   -Reviewed notes from Mackie Pai and message from Dr. Charlett Blake (family medicine).   -Increase Gabapentin to 800mg  three times per day.   -Pain contract signed today.  -UDS obtained today.  -PDMP reviewed. Norco 5mg  up to 4 times daily prescribed. MME 20-40.  -Provided referral to [facility] for evaluation for lumbar sympathetic nerve block.   All questions answered. RTC in 2 weeks.

## 2020-05-11 DIAGNOSIS — Z86010 Personal history of colonic polyps: Secondary | ICD-10-CM | POA: Diagnosis not present

## 2020-05-13 LAB — TOXASSURE SELECT,+ANTIDEPR,UR

## 2020-05-14 ENCOUNTER — Telehealth: Payer: Self-pay | Admitting: *Deleted

## 2020-05-14 NOTE — Telephone Encounter (Signed)
Urine drug screen for this encounter is consistent for prescribed medication 

## 2020-05-19 ENCOUNTER — Encounter: Payer: Self-pay | Admitting: Physical Medicine and Rehabilitation

## 2020-05-19 ENCOUNTER — Other Ambulatory Visit: Payer: Self-pay

## 2020-05-19 ENCOUNTER — Encounter
Payer: Medicare Other | Attending: Physical Medicine and Rehabilitation | Admitting: Physical Medicine and Rehabilitation

## 2020-05-19 VITALS — BP 126/78 | HR 77 | Temp 98.8°F | Ht 62.0 in | Wt 161.0 lb

## 2020-05-19 DIAGNOSIS — G620 Drug-induced polyneuropathy: Secondary | ICD-10-CM | POA: Insufficient documentation

## 2020-05-19 DIAGNOSIS — T451X5A Adverse effect of antineoplastic and immunosuppressive drugs, initial encounter: Secondary | ICD-10-CM | POA: Insufficient documentation

## 2020-05-19 DIAGNOSIS — Z79891 Long term (current) use of opiate analgesic: Secondary | ICD-10-CM | POA: Diagnosis not present

## 2020-05-19 DIAGNOSIS — G894 Chronic pain syndrome: Secondary | ICD-10-CM | POA: Insufficient documentation

## 2020-05-19 DIAGNOSIS — Z5181 Encounter for therapeutic drug level monitoring: Secondary | ICD-10-CM | POA: Insufficient documentation

## 2020-05-19 MED ORDER — HYDROCODONE-ACETAMINOPHEN 10-325 MG PO TABS: 1.0000 | ORAL_TABLET | Freq: Four times a day (QID) | ORAL | 0 refills | Status: DC | PRN

## 2020-05-19 NOTE — Progress Notes (Signed)
Subjective:    Patient ID: Kristi [name], female    DOB: 07-28-1956, 64 y.o.   MRN: [id number]  HPI  Kristi [name] is a 64 year old woman with breast cancer. She had 17 LN removed in her left arm. She went through chemo and radiation. She is on Anastrazole. She has had bilateral lower extremity neuropathy since 2015.   She has had neuropathy that has been progressively getting worse. She is taking gabapentin that used to help but it has stopped helping as much as it used to.  She was taking 600mg  TID and it was not making her too drowsy. Last visit I increased the dose to 800mg  TID and this did not provide much pain relief but did make her a little bit drowsier.   Cymbalta did not help. She was on Amitriptyline but this was stopped when she was having problems with memory. She tried Lyrica that made her feel weird. She is taking Hydrocodone and would like [place] to take over all her pain management. She only has 4 pills left at this time. Her current dose is not providing her enough relief and she would like to try a higher dose.   She did PT for a couple of months for her balance and did find this to be helpful.   Since last visit we signed a pain contract with her and obtained a UDS and this showed expected metabolites.   Pain Inventory Average Pain 8 Pain Right Now 8 My pain is constant, burning, tingling and aching  In the last 24 hours, has pain interfered with the following? General activity 6 Relation with others 6 Enjoyment of life 6 What TIME of day is your pain at its worst? All the time. Sleep (in general) Fair  Pain is worse with: walking, sitting, inactivity and standing Pain improves with: medication Relief from Meds: 7  Mobility walk without assistance how many minutes can you walk? 5 mins ability to climb steps?  yes do you drive?  yes Do you have any goals in this area?  yes  Function disabled: date disabled 2015 I need assistance with the following:  Needs no  help at home.  Neuro/Psych tingling trouble walking depression anxiety  Prior Studies Any changes since last visit?  no New  Patient  Physicians involved in your care Any changes since last visit?  yes New Patient   Family History  Problem Relation Age of Onset  . Lung cancer Father   . Hypertension Father   . Thyroid cancer Father        dx in his 72s  . Cancer Father        lung, thyroid, smoker  . Breast cancer Paternal Aunt 85  . Colon cancer Paternal Aunt        dx in her 50x  . Cervical cancer Paternal Aunt        dzx in her 87s  . Ovarian cancer Cousin        dx in her lage 41s  . Breast cancer Cousin        maternal first cousin, once removed; dx in her late 66s  . Breast cancer Cousin        maternal first cousin once removed; dx in late 71s  . Hypertension Mother   . Diabetes Mother   . Dementia Mother   . Memory loss Mother   . Hypertension Brother   . Seizures Brother  Alcohol induced.  . Alcohol abuse Brother        drinker, smoker  . Cancer Paternal Uncle        oral cancer  . Kidney cancer Paternal Grandmother   . Arthritis Daughter        back surgery  . Cancer Cousin        several paternal cousins with brain cancer, leukemia, and other cancers  . Cancer Sister        stomach   Social History   Socioeconomic History  . Marital status: Married    Spouse name: Kristi [name]  . Number of children: 1  . Years of education: 28  . Highest education level: Not on file  Occupational History  . 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 Smoking status: Never Smoker  . Smokeless tobacco: Never Used  Vaping Use  . Vaping Use: Never used  Substance and Sexual Activity  . Alcohol use: No  . Drug use: No  . Sexual activity: Yes    Comment: lives with husband, disability/retirement. [employer], no dietary restrictions  Other Topics Concern  . Not on file  Social History Narrative   Patient  is married Kristi [name]) and lives at home with her husband.   Patient has one daughter.   Patient is working at [employer]   Patient has a 12th grade education.   Patient drinks very little caffeine.   Social Determinants of Health   Financial Resource Strain:   . Difficulty of Paying Living Expenses:   Food Insecurity:   . Worried About Charity fundraiser in the Last Year:   . Arboriculturist in the Last Year:   Transportation Needs:   . Film/video editor (Medical):   Marland Kitchen Lack of Transportation (Non-Medical):   Physical Activity:   . Days of Exercise per Week:   . Minutes of Exercise per Session:   Stress:   . Feeling of Stress :   Social Connections:   . Frequency of Communication with Friends and Family:   . Frequency of Social Gatherings with Friends and Family:   . Attends Religious Services:   . Active Member of Clubs or Organizations:   . Attends Archivist Meetings:   Marland Kitchen Marital Status:    Past Surgical History:  Procedure Laterality Date  . ANAL SPHINCTEROTOMY  04/2011  . APPENDECTOMY  1980  . AXILLARY LYMPH NODE DISSECTION Left 02/04/2013   Procedure: LEFT AXILLARY LYMPH NODE DISSECTION;  Surgeon: Stark Klein, MD;  Location: [place];  Service: General;  Laterality: Left;  End: 8756  . BREAST LUMPECTOMY WITH NEEDLE LOCALIZATION Left 02/04/2013   Procedure: LEFT BREAST LUMPECTOMY WITH NEEDLE LOCALIZATION;  Surgeon: Stark Klein, MD;  Location: [place];  Service: General;  Laterality: Left;  . BREAST SURGERY     Lumpectomy in april 2014  . HEMORRHOID SURGERY  04/2011   ligation  . MASTECTOMY Left 02/15/2017  . PORT-A-CATH REMOVAL N/A 04/16/2014   Procedure: REMOVAL PORT-A-CATH;  Surgeon: Stark Klein, MD;  [facility];  Service: General;  Laterality: N/A;  . PORTACATH PLACEMENT Right 02/04/2013   Procedure: INSERTION PORT-A-CATH;  Surgeon: Stark Klein, MD;  Location: [place];  Service: General;  Laterality: Right;  Start Time: 4332.  Marland Kitchen SHOULDER ARTHROSCOPY WITH  ROTATOR CUFF REPAIR AND SUBACROMIAL DECOMPRESSION Left 02/24/2014   Procedure: SHOULDER ARTHROSCOPY WITH ROTATOR CUFF REPAIR AND SUBACROMIAL DECOMPRESSION;  Surgeon: Tonna Corner  Marlou Sa, MD;  Location: [place];  Service: Orthopedics;  Laterality: Left;  LEFT SHOULDER DIAGNOSTIC OPERATIVE ARTHROSCOPY, SUBACROMIAL DECOMPRESSION, ROTATOR CUFF TEAR REPAIR  . SIMPLE MASTECTOMY WITH AXILLARY SENTINEL NODE BIOPSY Left 02/15/2017   Procedure: LEFT MASTECTOMY;  Surgeon: Stark Klein, MD;  Location: [place];  Service: General;  Laterality: Left;  . TOTAL MASTECTOMY Right 12/26/2018   Procedure: RIGHT BREAST PROPHYLATIC MASTECTOMY;  Surgeon: Stark Klein, MD;  Location: [place];  Service: General;  Laterality: Right;   Past Medical History:  Diagnosis Date  . Anemia    Iron deficinecy anemia  . Anemia 05/14/2017  . Anxiety   . Anxiety and depression 05/15/2014  . Arthritis   . Breast cancer ([place])    left ,last radiation 2'15, last chemo 8'14  . Constipation 11/13/2016  . Depression   . History of chicken pox   . History of radiation therapy 09/09/13-10/28/13   45 gray to left breast, lumpectomy cavity boosted to 63 gray  . HTN (hypertension) 11/13/2016  . Hyperglycemia 01/09/2016  . Hyperlipidemia 05/14/2017  . Hypertension   . MRSA (methicillin resistant Staphylococcus aureus) 2009   right groin area-no issues now. 04-07-14 PCR screen negative today.  . Neuropathy   . Preventative health care 11/13/2016   BP 126/78   Pulse 77   Temp 98.8 F (37.1 C)   Ht 5\' 2"  (1.575 m)   Wt 161 lb (73 kg)   LMP 01/20/2013   SpO2 98%   BMI 29.45 kg/m   Opioid Risk Score:   Fall Risk Score:  `1  Depression screen PHQ 2/9  Depression screen [facility] 2/9 05/05/2020 05/02/2019 04/30/2018 10/12/2016  Decreased Interest 2 0 0 0  Down, Depressed, Hopeless 1 0 1 0  PHQ - 2 Score 3 0 1 0  Altered sleeping 2 - - -  Tired, decreased energy 3 - - -  Change in appetite 2 - - -  Feeling bad or failure about yourself  1 - - -  Trouble  concentrating 1 - - -  Moving slowly or fidgety/restless 1 - - -  Suicidal thoughts 0 - - -  PHQ-9 Score 13 - - -  Some encounter information is confidential and restricted. Go to Review Flowsheets activity to see all data.  Some recent data might be hidden   Review of Systems  Constitutional: Negative.   HENT: Negative.   Eyes: Negative.   Respiratory: Negative.   Gastrointestinal: Positive for constipation.  Endocrine: Negative.   Genitourinary: Negative.   Musculoskeletal: Positive for gait problem.  Allergic/Immunologic: Negative.   Neurological:       Tingling & Numbness in hands & feet  Psychiatric/Behavioral:       Anxiety, depression       Objective:   Physical Exam Gen: no distress, normal appearing HEENT: oral mucosa pink and moist, NCAT Cardio: Reg rate Chest: normal effort, normal rate of breathing Abd: soft, non-distended Ext: no edema Skin: intact Neuro: Alert and oriented x3 Musculoskeletal: 5/5 strength throughout. Feet not tender to palpation. Wearing comfortable shoes.  Psych: pleasant, normal affect    Assessment & Plan:  Mrs. Gillham is a 64 year old woman who presents with bilateral foot painful neuropathy since starting Anastrazole chemotherapy for breast cancer in 2015.   -Reviewed notes from Mackie Pai and message from Dr. Charlett Blake (family medicine).   -Increased Gabapentin to 800mg  three times per day last visit but this did not provide much relief and made her sleepier. Discussed with patient  to return to 600mg  BID and maintain 800mg  HS.   -Pain contract signed last visit.   -UDS obtained last visit. Personally reviewed and contains expected metabolites.   -PDMP reviewed. Norco 10mg  up to 4 times daily prescribed. MME 40-80.  -Provided referral to [facility] for evaluation for lumbar sympathetic nerve block. She heard from them this morning and will schedule appointment with them.  All questions answered. RTC in 6  weeks. I have sent 6 week supply of her medication.

## 2020-05-20 NOTE — Progress Notes (Signed)
Patient Care Team: Mosie Lukes, MD as PCP - General (Family Medicine) Nicholas Lose, MD as Consulting Physician (Hematology and Oncology) Lin Landsman, MD as Consulting Physician (Family Medicine) Inocencio Homes, [place] as Consulting Physician (Podiatry) Gery Pray, MD as Consulting Physician (Radiation Oncology) Dan Humphreys as Consulting Physician (Nurse Practitioner)  DIAGNOSIS:    ICD-10-CM   1. Primary cancer of upper inner quadrant of left female breast ([place])  C50.212     SUMMARY OF ONCOLOGIC HISTORY: Oncology History  Primary cancer of upper inner quadrant of left female breast ([place])  01/24/2013 Initial Diagnosis   Ultrasound: Left breast no position 1 x 0.7 cm, 12:00 position 1.6 x 0.9 cm in addition 2.1 cm axillary lymph node: Biopsies of breast and axilla were positive for IDC high-grade;  ER weakly positive, PR negative, HER-2 negative   02/04/2013 Surgery   Left breast lumpectomy: 1.6 cm tumor 1/19 axillary lymph nodes positive, ER 6%, PR 0%, HER-2 negative, Ki-67 90%   03/05/2013 - 08/21/2013 Chemotherapy   Adjuvant chemotherapy with Adriamycin and Cytoxan followed by Taxol and [place] x9 followed by Purvis [place] x2   03/06/2013 Genetic Testing   Negative genetic testing on the Comprehensive Cancer Panel.  The Comprehensive Common Cancer Panel offered by [facility] includes sequencing and/or deletion duplication testing of the following 46 genes: APC, ATM, AXIN2, BAP1, BARD1, BMPR1A, BRCA1, BRCA2, BRIP1, CDH1, CDK4, CDKN2A, CHEK2, EPCAM, FANCC, FH, FLCN, HOXB13, MET, MITF,  MLH1, MSH2, MSH6, MUTYH, NBN, NF1, NTHL1,  PALB2, PMS2, POLD1, POLE, POT1, PTEN, RAD51C, RAD51D, RECQL, SCG5/GREM1, SDHB, SDHC, SDHD, SMAD4, STK11, TP53, TSC1, TSC2, and VHL.  There had been a RAD51C c.919G>A VUS identified that was reclassified to likely benign on February 20, 2019.  The original report date was Mar 06, 2013.   09/09/2013 - 10/28/2013 Radiation Therapy   Adjuvant radiation therapy   11/13/2013  -  Anti-estrogen oral therapy   Tamoxifen 20 mg daily switched to anastrozole May 2017   02/15/2017 Surgery   Left mastectomy: No malignancy identified   12/26/2018 Surgery   Right mastectomy prophylactic: Benign     CHIEF COMPLIANT: Follow-up of left breast cancer on anastrozole  INTERVAL HISTORY: Kristi [name] is a 63 y.o. with above-mentioned history of left breast cancer treated with lumpectomy, adjuvant chemotherapy, radiation, and is currently on antiestrogen therapy with anastrozole. She presents to the clinic today for annual follow-up.   ALLERGIES:  has No Known Allergies.  MEDICATIONS:  Current Outpatient Medications  Medication Sig Dispense Refill   amitriptyline (ELAVIL) 100 MG tablet Take 100 mg by mouth at bedtime.   2   anastrozole (ARIMIDEX) 1 MG tablet TAKE 1 TABLET (1 MG TOTAL) BY MOUTH DAILY. TAKE 1 TABLET (1 MG) BY MOUTH DAILY IN THE MORNING 90 tablet 3   Apoaequorin (PREVAGEN PO) Take by mouth.     aspirin EC 81 MG tablet Take 1 tablet (81 mg total) by mouth daily.     Docusate Sodium (COLACE PO) Take by mouth.      gabapentin (NEURONTIN) 800 MG tablet Take 1 tablet (800 mg total) by mouth 3 (three) times daily. 90 tablet 3   HYDROcodone-acetaminophen (NORCO) 10-325 MG tablet Take 1 tablet by mouth every 6 (six) hours as needed. 180 tablet 0   hydrOXYzine (ATARAX/VISTARIL) 25 MG tablet Take 25 mg by mouth 2 (two) times daily. 25 to 75 mg daily     KLOR-CON M20 20 MEQ tablet TAKE 1 TABLET BY MOUTH EVERY DAY 90 tablet  1   LATUDA 80 MG TABS tablet Take 80 mg by mouth at bedtime.   1   magnesium 30 MG tablet Take 250 mg by mouth once.     meloxicam (MOBIC) 7.5 MG tablet TAKE 1 TABLET BY MOUTH EVERY DAY AS NEEDED FOR PAIN 30 tablet 1   Multiple Vitamin (MULTIVITAMIN WITH MINERALS) TABS tablet Take 1 tablet by mouth daily.     POTASSIUM CHLORIDE PO Take 20 mEq by mouth.     traZODone (DESYREL) 50 MG tablet      valsartan-hydrochlorothiazide  (DIOVAN-HCT) 320-25 MG tablet TAKE 1 TABLET BY MOUTH EVERY DAY IN THE MORNING 90 tablet 1   No current facility-administered medications for this visit.    PHYSICAL EXAMINATION: ECOG PERFORMANCE STATUS: 1 - Symptomatic but completely ambulatory  There were no vitals filed for this visit. There were no vitals filed for this visit.  BREAST: No palpable masses or nodules in either right or left breasts. No palpable axillary supraclavicular or infraclavicular adenopathy no breast tenderness or nipple discharge. (exam performed in the presence of a chaperone)  LABORATORY DATA:  I have reviewed the data as listed CMP Latest Ref Rng & Units 01/21/2020 10/07/2019 06/19/2019  Glucose 70 - 99 mg/dL 119(H) 112(H) 95  BUN 6 - 23 mg/dL 11 10 15   Creatinine 0.40 - 1.20 mg/dL 1.15 1.18(H) 1.15  Sodium 135 - 145 mEq/L 140 139 140  Potassium 3.5 - 5.1 mEq/L 3.8 4.0 4.4  Chloride 96 - 112 mEq/L 103 104 102  CO2 19 - 32 mEq/L 31 27 30   Calcium 8.4 - 10.5 mg/dL 9.7 10.0 10.2  Total Protein 6.0 - 8.3 g/dL 6.7 7.6 7.1  Total Bilirubin 0.2 - 1.2 mg/dL 0.5 0.5 0.2  Alkaline Phos 39 - 117 U/L 99 92 89  AST 0 - 37 U/L 20 27 20   ALT 0 - 35 U/L 17 26 19     Lab Results  Component Value Date   WBC 5.0 01/21/2020   HGB 11.5 (L) 01/21/2020   HCT 35.3 (L) 01/21/2020   MCV 86.3 01/21/2020   PLT 181.0 01/21/2020   NEUTROABS 2.7 10/07/2019    ASSESSMENT & PLAN:  Primary cancer of upper 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 followed by Taxol carboplatin later switched to [place] completed 08/21/2013, status post adjuvant radiation, currently on tamoxifen started 11/13/2013 Switched to anastrozole 03/10/2016  Anastrozole toxicities: Tolerating it extremely well. Plan is to treat  her for 7 years. I renewed her prescription today.  Left mastectomy: 02/15/2017: Benign Right mastectomy: 12/26/2018: Benign  Breast cancer surveillance: No role of mammogram since she had bilateral mastectomies. Chest examination no palpable lumps or nodules.  Return to clinic in 1 year for follow-up  No orders of the defined types were placed in this encounter.  The patient has a good understanding of the overall plan. she agrees with it. she will call with any problems that may develop before the next visit here.  Total time spent: 20 mins including face to face time and time spent for planning, charting and coordination of care  Nicholas Lose, MD 05/21/2020  I, Cloyde Reams Dorshimer, am acting as scribe for Dr. Nicholas Lose.  I have reviewed the above documentation for accuracy and completeness, and I agree with  the above.

## 2020-05-21 ENCOUNTER — Other Ambulatory Visit: Payer: Self-pay

## 2020-05-21 ENCOUNTER — Ambulatory Visit: Payer: Medicare Other | Admitting: Physical Medicine and Rehabilitation

## 2020-05-21 ENCOUNTER — Inpatient Hospital Stay: Payer: Medicare Other | Attending: Hematology and Oncology | Admitting: Hematology and Oncology

## 2020-05-21 DIAGNOSIS — Z7982 Long term (current) use of aspirin: Secondary | ICD-10-CM | POA: Insufficient documentation

## 2020-05-21 DIAGNOSIS — Z79899 Other long term (current) drug therapy: Secondary | ICD-10-CM | POA: Diagnosis not present

## 2020-05-21 DIAGNOSIS — Z9013 Acquired absence of bilateral breasts and nipples: Secondary | ICD-10-CM | POA: Diagnosis not present

## 2020-05-21 DIAGNOSIS — Z170 Estrogen receptor positive status [ER+]: Secondary | ICD-10-CM | POA: Diagnosis not present

## 2020-05-21 DIAGNOSIS — Z79811 Long term (current) use of aromatase inhibitors: Secondary | ICD-10-CM | POA: Insufficient documentation

## 2020-05-21 DIAGNOSIS — Z923 Personal history of irradiation: Secondary | ICD-10-CM | POA: Diagnosis not present

## 2020-05-21 DIAGNOSIS — C50212 Malignant neoplasm of upper-inner quadrant of left female breast: Secondary | ICD-10-CM

## 2020-05-21 MED ORDER — LATUDA 80 MG PO TABS: 40.0000 mg | ORAL_TABLET | Freq: Every day | ORAL | 1 refills | Status: DC

## 2020-05-21 MED ORDER — PREVAGEN 10 MG PO CAPS: 1.0000 | ORAL_CAPSULE | Freq: Every day | ORAL | Status: AC

## 2020-05-21 MED ORDER — ANASTROZOLE 1 MG PO TABS: 1.0000 mg | ORAL_TABLET | Freq: Every day | ORAL | 3 refills | Status: DC

## 2020-05-21 NOTE — Assessment & Plan Note (Signed)
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 followed by Taxol carboplatin later switched to [place] completed 08/21/2013, status post adjuvant radiation, currently on tamoxifen started 11/13/2013 Switched to anastrozole 03/10/2016  Anastrozole toxicities: Tolerating it extremely well. Plan is to treat her for 7 years. I renewed her prescription today.  Left mastectomy: 02/15/2017: Benign Right mastectomy: 12/26/2018: Benign  Breast cancer surveillance: No role of mammogram since she had bilateral mastectomies. Chest examination no palpable lumps or nodules.

## 2020-05-25 NOTE — Progress Notes (Signed)
I connected with Clarene Critchley today by telephone and verified that I am speaking with the correct person using two identifiers. Location patient: home Location provider: work Persons participating in the virtual visit: patient, Marine scientist.    I discussed the limitations, risks, security and privacy concerns of performing an evaluation and management service by telephone and the availability of in person appointments. I also discussed with the patient that there may be a patient responsible charge related to this service. The patient expressed understanding and verbally consented to this telephonic visit.    Interactive audio and video telecommunications were attempted between this provider and patient, however failed, due to patient having technical difficulties OR patient did not have access to video capability.  We continued and completed visit with audio only.  Some vital signs may be absent or patient reported.   Subjective:   Kristi [name] is a 64 y.o. female who presents for Medicare Annual (Subsequent) preventive examination.  Review of Systems    Cardiac Risk Factors include: advanced age (>19men, >22 women);dyslipidemia;hypertension     Objective:    Today's Vitals   05/26/20 1422  PainSc: 5    There is no height or weight on file to calculate BMI.  Advanced Directives 05/26/2020 10/07/2019 08/25/2019 05/02/2019 12/27/2018 12/19/2018 04/30/2018  Does Patient Have a Medical Advance Directive? No No No No No No No  Would patient like information on creating a medical advance directive? No - Patient declined - - No - Patient declined No - Patient declined No - Patient declined Yes (MAU/Ambulatory/Procedural Areas - Information given)  Pre-existing out of facility DNR order (yellow form or pink MOST form) - - - - - - -    Current Medications (verified) Outpatient Encounter Medications as of 05/26/2020  Medication Sig  . anastrozole (ARIMIDEX) 1 MG tablet Take 1 tablet (1 mg total) by mouth  daily. TAKE 1 TABLET (1 MG) BY MOUTH DAILY IN THE MORNING  . Apoaequorin (PREVAGEN) 10 MG CAPS Take 1 capsule by mouth daily.  Marland Kitchen gabapentin (NEURONTIN) 800 MG tablet Take 1 tablet (800 mg total) by mouth 3 (three) times daily.  Marland Kitchen HYDROcodone-acetaminophen (NORCO) 10-325 MG tablet Take 1 tablet by mouth every 6 (six) hours as needed.  . hydrOXYzine (ATARAX/VISTARIL) 25 MG tablet Take 25 mg by mouth 2 (two) times daily. 25 to 75 mg daily  . LATUDA 80 MG TABS tablet Take 0.5 tablets (40 mg total) by mouth at bedtime.  . magnesium 30 MG tablet Take 250 mg by mouth once.  . Multiple Vitamin (MULTIVITAMIN WITH MINERALS) TABS tablet Take 1 tablet by mouth daily.  . traZODone (DESYREL) 50 MG tablet   . valsartan-hydrochlorothiazide (DIOVAN-HCT) 320-25 MG tablet TAKE 1 TABLET BY MOUTH EVERY DAY IN THE MORNING  . aspirin EC 81 MG tablet Take 1 tablet (81 mg total) by mouth daily. (Patient not taking: Reported on 05/26/2020)  . KLOR-CON M20 20 MEQ tablet TAKE 1 TABLET BY MOUTH EVERY DAY (Patient not taking: Reported on 05/26/2020)  . [DISCONTINUED] prochlorperazine (COMPAZINE) 10 MG tablet Take 1 tablet (10 mg total) by mouth every 6 (six) hours as needed (Nausea or vomiting).  . [DISCONTINUED] prochlorperazine (COMPAZINE) 25 MG suppository Place 1 suppository (25 mg total) rectally every 12 (twelve) hours as needed for nausea.   No facility-administered encounter medications on file as of 05/26/2020.    Allergies (verified) Patient has no known allergies.   History: Past Medical History:  Diagnosis Date  . Anemia    Iron  deficinecy anemia  . Anemia 05/14/2017  . Anxiety   . Anxiety and depression 05/15/2014  . Arthritis   . Breast cancer ([place])    left ,last radiation 2'15, last chemo 8'14  . Constipation 11/13/2016  . Depression   . History of chicken pox   . History of radiation therapy 09/09/13-10/28/13   45 gray to left breast, lumpectomy cavity boosted to 63 gray  . HTN (hypertension) 11/13/2016    . Hyperglycemia 01/09/2016  . Hyperlipidemia 05/14/2017  . Hypertension   . MRSA (methicillin resistant Staphylococcus aureus) 2009   right groin area-no issues now. 04-07-14 PCR screen negative today.  . Neuropathy   . Preventative health care 11/13/2016   Past Surgical History:  Procedure Laterality Date  . ANAL SPHINCTEROTOMY  04/2011  . APPENDECTOMY  1980  . AXILLARY LYMPH NODE DISSECTION Left 02/04/2013   Procedure: LEFT AXILLARY LYMPH NODE DISSECTION;  Surgeon: Stark Klein, MD;  Location: [facility];  Service: General;  Laterality: Left;  End: 2637  . BREAST LUMPECTOMY WITH NEEDLE LOCALIZATION Left 02/04/2013   Procedure: LEFT BREAST LUMPECTOMY WITH NEEDLE LOCALIZATION;  Surgeon: Stark Klein, MD;  Location: [facility];  Service: General;  Laterality: Left;  . BREAST SURGERY     Lumpectomy in april 2014  . HEMORRHOID SURGERY  04/2011   ligation  . MASTECTOMY Left 02/15/2017  . PORT-A-CATH REMOVAL N/A 04/16/2014   Procedure: REMOVAL PORT-A-CATH;  Surgeon: Stark Klein, MD;  Location: [facility];  Service: General;  Laterality: N/A;  . PORTACATH PLACEMENT Right 02/04/2013   Procedure: INSERTION PORT-A-CATH;  Surgeon: Stark Klein, MD;  Location: [facility];  Service: General;  Laterality: Right;  Start Time: 8588.  Marland Kitchen SHOULDER ARTHROSCOPY WITH ROTATOR CUFF REPAIR AND SUBACROMIAL DECOMPRESSION Left 02/24/2014   Procedure: SHOULDER ARTHROSCOPY WITH ROTATOR CUFF REPAIR AND SUBACROMIAL DECOMPRESSION;  Surgeon: Meredith Pel, MD;  Location: [facility];  Service: Orthopedics;  Laterality: Left;  LEFT SHOULDER DIAGNOSTIC OPERATIVE ARTHROSCOPY, SUBACROMIAL DECOMPRESSION, ROTATOR CUFF TEAR REPAIR  . SIMPLE MASTECTOMY WITH AXILLARY SENTINEL NODE BIOPSY Left 02/15/2017   Procedure: LEFT MASTECTOMY;  Surgeon: Stark Klein, MD;  Location: [facility];  Service: General;  Laterality: Left;  . TOTAL MASTECTOMY Right 12/26/2018   Procedure: RIGHT BREAST PROPHYLATIC MASTECTOMY;  Surgeon: Stark Klein, MD;  Location: [facility];  Service:  General;  Laterality: Right;   Family History  Problem Relation Age of Onset  . Lung cancer Father   . Hypertension Father   . Thyroid cancer Father        dx in his 53s  . Cancer Father        lung, thyroid, smoker  . Breast cancer Paternal Aunt 41  . Colon cancer Paternal Aunt        dx in her 50x  . Cervical cancer Paternal Aunt        dzx in her 87s  . Ovarian cancer Cousin        dx in her lage 6s  . Breast cancer Cousin        maternal first cousin, once removed; dx in her late 82s  . Breast cancer Cousin        maternal first cousin once removed; dx in late 30s  . Hypertension Mother   . Diabetes Mother   . Dementia Mother   . Memory loss Mother   . Hypertension Brother   . Seizures Brother        Alcohol induced.  . Alcohol abuse Brother  drinker, smoker  . Cancer Paternal Uncle        oral cancer  . Kidney cancer Paternal Grandmother   . Arthritis Daughter        back surgery  . Cancer Cousin        several paternal cousins with brain cancer, leukemia, and other cancers  . Cancer Sister        stomach   Social History   Socioeconomic History  . Marital status: Married    Spouse name: [name]  . Number of children: 1  . Years of education: 89  . Highest education level: Not on file  Occupational History  . 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 Smoking status: Never Smoker  . Smokeless tobacco: Never Used  Vaping Use  . Vaping Use: Never used  Substance and Sexual Activity  . Alcohol use: No  . Drug use: No  . Sexual activity: Yes    Comment: lives with husband, disability/retirement. [employer], no dietary restrictions  Other Topics Concern  . Not on file  Social History Narrative   Patient is married [name]) and lives at home with her husband.   Patient has one daughter.   Patient is working at [employer]   Patient has a 12th grade education.   Patient drinks very little caffeine.    Social Determinants of Health   Financial Resource Strain: Low Risk   . Difficulty of Paying Living Expenses: Not hard at all  Food Insecurity: No Food Insecurity  . Worried About Charity fundraiser in the Last Year: Never true  . Ran Out of Food in the Last Year: Never true  Transportation Needs: No Transportation Needs  . Lack of Transportation (Medical): No  . Lack of Transportation (Non-Medical): No  Physical Activity:   . Days of Exercise per Week:   . Minutes of Exercise per Session:   Stress:   . Feeling of Stress :   Social Connections:   . Frequency of Communication with Friends and Family:   . Frequency of Social Gatherings with Friends and Family:   . Attends Religious Services:   . Active Member of Clubs or Organizations:   . Attends Archivist Meetings:   Marland Kitchen Marital Status:     Tobacco Counseling Counseling given: Not Answered   Clinical Intake:     Pain : 0-10 Pain Score: 5  Pain Type: Neuropathic pain Pain Location: Foot Pain Orientation: Right, Left Pain Onset: More than a month ago Pain Frequency: Constant Pain Relieving Factors: medication  Pain Relieving Factors: medication    Activities of Daily Living In your present state of health, do you have any difficulty performing the following activities: 05/26/2020  Hearing? N  Vision? N  Difficulty concentrating or making decisions? N  Walking or climbing stairs? N  Dressing or bathing? N  Doing errands, shopping? N  Preparing Food and eating ? N  Using the Toilet? N  In the past six months, have you accidently leaked urine? N  Do you have problems with loss of bowel control? N  Managing your Medications? N  Managing your Finances? N  Housekeeping or managing your Housekeeping? N  Some recent data might be hidden    Patient Care Team: Mosie Lukes, MD as PCP - General (Family Medicine) Nicholas Lose, MD as Consulting Physician (Hematology and Oncology) Lin Landsman, MD as  Consulting Physician (Family Medicine) Inocencio Homes, DPM  as Consulting Physician (Podiatry) Gery Pray, MD as Consulting Physician (Radiation Oncology) Dan Humphreys as Consulting Physician (Nurse Practitioner)  Indicate any recent Medical Services you may have received from other than [facility] providers in the past year (date may be approximate).     Assessment:   This is a routine wellness examination for Chayce.  Dietary issues and exercise activities discussed: Current Exercise Habits: The patient does not participate in regular exercise at present, Exercise limited by: neurologic condition(s);orthopedic condition(s) Diet (meal preparation, eat out, water intake, caffeinated beverages, dairy products, fruits and vegetables): well balanced  Goals    . Increase physical activity      Depression Screen PHQ 2/9 Scores 05/26/2020 05/05/2020 05/02/2019 04/30/2018 10/12/2016  PHQ - 2 Score 0 3 0 1 0  PHQ- 9 Score - 13 - - -  Some encounter information is confidential and restricted. Go to Review Flowsheets activity to see all data.    Fall Risk Fall Risk  05/26/2020 05/05/2020 12/08/2019 05/02/2019 04/30/2018  Falls in the past year? 1 0 1 1 No  Number falls in past yr: 0 - 1 1 -  Injury with Fall? 0 - 0 0 -  Follow up Education provided;Falls prevention discussed - - - -   Lives w/ husband in 2 story home.  Any stairs in or around the home? Yes  If so, are there any without handrails? No  Home free of loose throw rugs in walkways, pet beds, electrical cords, etc? Yes  Adequate lighting in your home to reduce risk of falls? Yes   ASSISTIVE DEVICES UTILIZED TO PREVENT FALLS:  Life alert? No  Use of a cane, walker or w/c? No  Grab bars in the bathroom? Yes  Shower chair or bench in shower? No  Elevated toilet seat or a handicapped toilet? No    Cognitive Function: Ad8 score reviewed for issues:  Issues making decisions:no  Less interest in hobbies / activities:no  Repeats  questions, stories (family complaining): no  Trouble using ordinary gadgets (microwave, computer, phone):no  Forgets the month or year: no  Mismanaging finances: no  Remembering appts:no Daily problems with thinking and/or memory:no Ad8 score is=0     MMSE - Mini Mental State Exam 12/08/2019 04/30/2018  Orientation to time 5 5  Orientation to Place 5 5  Registration 3 3  Attention/ Calculation 0 5  Recall 3 1  Language- name 2 objects 2 2  Language- repeat 1 1  Language- follow 3 step command 2 3  Language- read & follow direction 1 1  Write a sentence 1 1  Copy design 1 1  Total score 24 28        Immunizations Immunization History  Administered Date(s) Administered  . Influenza Inj Mdck Quad Pf 07/29/2018  . Influenza-Unspecified 07/15/2019  . PFIZER SARS-COV-2 Vaccination 01/30/2020, 02/24/2020     Flu Vaccine status: Up to date Covid-19 vaccine status: Completed vaccines    Screening Tests Health Maintenance  Topic Date Due  . Hepatitis C Screening  Never done  . HIV Screening  Never done  . TETANUS/TDAP  Never done  . DEXA SCAN  Never done  . PNA vac Low Risk Adult (1 of 2 - PCV13) Never done  . INFLUENZA VACCINE  06/06/2020  . MAMMOGRAM  12/05/2020  . PAP SMEAR-Modifier  06/18/2021  . COLONOSCOPY  02/25/2025  . COVID-19 Vaccine  Completed    Health Maintenance  Health Maintenance Due  Topic Date Due  . Hepatitis  C Screening  Never done  . HIV Screening  Never done  . TETANUS/TDAP  Never done  . DEXA SCAN  Never done  . PNA vac Low Risk Adult (1 of 2 - PCV13) Never done    Colorectal cancer screening: Completed 02/26/15. Repeat every 5 years. Pt has scheduled for next week. Mammogram-pt declines. States she has had bilateral breast mastectomy Bone Density-pt declines.  Lung Cancer Screening: (Low Dose CT Chest recommended if Age 21-80 years, 30 pack-year currently smoking OR have quit w/in 15years.) does not qualify.    Additional  Screening:   Vision Screening: Recommended annual ophthalmology exams for early detection of glaucoma and other disorders of the eye. Is the patient up to date with their annual eye exam?  Yes  per pt   Dental Screening: Recommended annual dental exams for proper oral hygiene  Community Resource Referral / Chronic Care Management: CRR required this visit?  No   CCM required this visit?  No      Plan:    Please schedule your next medicare wellness visit with me in 1 yr.  Continue to eat heart healthy diet (full of fruits, vegetables, whole grains, lean protein, water--limit salt, fat, and sugar intake) and increase physical activity as tolerated.  Continue doing brain stimulating activities (puzzles, reading, adult coloring books, staying active) to keep memory sharp.   I have personally reviewed and noted the following in the patient's chart:   . Medical and social history . Use of alcohol, tobacco or illicit drugs  . Current medications and supplements . Functional ability and status . Nutritional status . Physical activity . Advanced directives . List of other physicians . Hospitalizations, surgeries, and ER visits in previous 12 months . Vitals . Screenings to include cognitive, depression, and falls . Referrals and appointments  In addition, I have reviewed and discussed with patient certain preventive protocols, quality metrics, and best practice recommendations. A written personalized care plan for preventive services as well as general preventive health recommendations were provided to patient.   Due to this being a telephonic visit, the after visit summary with patients personalized plan was offered to patient via mail or my-chart.Patient would like to access on my-chart.   Naaman Plummer [place], [place]   05/26/2020

## 2020-05-26 ENCOUNTER — Other Ambulatory Visit: Payer: Self-pay

## 2020-05-26 ENCOUNTER — Encounter: Payer: Self-pay | Admitting: *Deleted

## 2020-05-26 ENCOUNTER — Ambulatory Visit (INDEPENDENT_AMBULATORY_CARE_PROVIDER_SITE_OTHER): Payer: Medicare Other | Admitting: *Deleted

## 2020-05-26 DIAGNOSIS — Z0000 Encounter for general adult medical examination without abnormal findings: Secondary | ICD-10-CM

## 2020-05-26 NOTE — Patient Instructions (Signed)
Please schedule your next medicare wellness visit with me in 1 yr.  Continue to eat heart healthy diet (full of fruits, vegetables, whole grains, lean protein, water--limit salt, fat, and sugar intake) and increase physical activity as tolerated.  Continue doing brain stimulating activities (puzzles, reading, adult coloring books, staying active) to keep memory sharp.    Kristi [name] , Thank you for taking time to come for your Medicare Wellness Visit. I appreciate your ongoing commitment to your health goals. Please review the following plan we discussed and let me know if I can assist you in the future.   These are the goals we discussed: Goals     Increase physical activity       This is a list of the screening recommended for you and due dates:  Health Maintenance  Topic Date Due    Hepatitis C: One time screening is recommended by Center for Disease Control  (CDC) for  adults born from [birth year] through 1966.   Never done   HIV Screening  Never done   Tetanus Vaccine  Never done   DEXA scan (bone density measurement)  Never done   Pneumonia vaccines (1 of 2 - PCV13) Never done   Flu Shot  06/06/2020   Mammogram  12/05/2020   Pap Smear  06/18/2021   Colon Cancer Screening  02/25/2025   COVID-19 Vaccine  Completed    Preventive Care 29-20 Years Old, Female Preventive care refers to visits with your health care provider and lifestyle choices that can promote health and wellness. This includes:  A yearly physical exam. This may also be called an annual well check.  Regular dental visits and eye exams.  Immunizations.  Screening for certain conditions.  Healthy lifestyle choices, such as eating a healthy diet, getting regular exercise, not using drugs or products that contain nicotine and tobacco, and limiting alcohol use. What can I expect for my preventive care visit? Physical exam Your health care provider will check your:  Height and weight. This may be used to  calculate body mass index (BMI), which tells if you are at a healthy weight.  Heart rate and blood pressure.  Skin for abnormal spots. Counseling Your health care provider may ask you questions about your:  Alcohol, tobacco, and drug use.  Emotional well-being.  Home and relationship well-being.  Sexual activity.  Eating habits.  Work and work Statistician.  Method of birth control.  Menstrual cycle.  Pregnancy history. What immunizations do I need?  Influenza (flu) vaccine  This is recommended every year. Tetanus, diphtheria, and pertussis (Tdap) vaccine  You may need a Td booster every 10 years. Varicella (chickenpox) vaccine  You may need this if you have not been vaccinated. Zoster (shingles) vaccine  You may need this after age 31. Measles, mumps, and rubella (MMR) vaccine  You may need at least one dose of MMR if you were born in 1957 or later. You may also need a second dose. Pneumococcal conjugate (PCV13) vaccine  You may need this if you have certain conditions and were not previously vaccinated. Pneumococcal polysaccharide (PPSV23) vaccine  You may need one or two doses if you smoke cigarettes or if you have certain conditions. Meningococcal conjugate (MenACWY) vaccine  You may need this if you have certain conditions. Hepatitis A vaccine  You may need this if you have certain conditions or if you travel or work in places where you may be exposed to hepatitis A. Hepatitis B vaccine  You may  need this if you have certain conditions or if you travel or work in places where you may be exposed to hepatitis B. Haemophilus influenzae type b (Hib) vaccine  You may need this if you have certain conditions. Human papillomavirus (HPV) vaccine  If recommended by your health care provider, you may need three doses over 6 months. You may receive vaccines as individual doses or as more than one vaccine together in one shot (combination vaccines). Talk with  your health care provider about the risks and benefits of combination vaccines. What tests do I need? Blood tests  Lipid and cholesterol levels. These may be checked every 5 years, or more frequently if you are over 22 years old.  Hepatitis C test.  Hepatitis B test. Screening  Lung cancer screening. You may have this screening every year starting at age 78 if you have a 30-pack-year history of smoking and currently smoke or have quit within the past 15 years.  Colorectal cancer screening. All adults should have this screening starting at age 11 and continuing until age 3. Your health care provider may recommend screening at age 38 if you are at increased risk. You will have tests every 1-10 years, depending on your results and the type of screening test.  Diabetes screening. This is done by checking your blood sugar (glucose) after you have not eaten for a while (fasting). You may have this done every 1-3 years.  Mammogram. This may be done every 1-2 years. Talk with your health care provider about when you should start having regular mammograms. This may depend on whether you have a family history of breast cancer.  BRCA-related cancer screening. This may be done if you have a family history of breast, ovarian, tubal, or peritoneal cancers.  Pelvic exam and Pap test. This may be done every 3 years starting at age 45. Starting at age 63, this may be done every 5 years if you have a Pap test in combination with an HPV test. Other tests  Sexually transmitted disease (STD) testing.  Bone density scan. This is done to screen for osteoporosis. You may have this scan if you are at high risk for osteoporosis. Follow these instructions at home: Eating and drinking  Eat a diet that includes fresh fruits and vegetables, whole grains, lean protein, and low-fat dairy.  Take vitamin and mineral supplements as recommended by your health care provider.  Do not drink alcohol if: ? Your health  care provider tells you not to drink. ? You are pregnant, may be pregnant, or are planning to become pregnant.  If you drink alcohol: ? Limit how much you have to 0-1 drink a day. ? Be aware of how much alcohol is in your drink. In the [place] one drink equals one 12 oz bottle of beer (355 mL), one 5 oz glass of wine (148 mL), or one 1 oz glass of hard liquor (44 mL). Lifestyle  Take daily care of your teeth and gums.  Stay active. Exercise for at least 30 minutes on 5 or more days each week.  Do not use any products that contain nicotine or tobacco, such as cigarettes, e-cigarettes, and chewing tobacco. If you need help quitting, ask your health care provider.  If you are sexually active, practice safe sex. Use a condom or other form of birth control (contraception) in order to prevent pregnancy and STIs (sexually transmitted infections).  If told by your health care provider, take low-dose aspirin daily starting at age  50. What's next?  Visit your health care provider once a year for a well check visit.  Ask your health care provider how often you should have your eyes and teeth checked.  Stay up to date on all vaccines. This information is not intended to replace advice given to you by your health care provider. Make sure you discuss any questions you have with your health care provider. Document Revised: 07/04/2018 Document Reviewed: 07/04/2018 Elsevier Patient Education  2020 [employer].

## 2020-06-02 DIAGNOSIS — Z86010 Personal history of colonic polyps: Secondary | ICD-10-CM | POA: Diagnosis not present

## 2020-06-06 DIAGNOSIS — I639 Cerebral infarction, unspecified: Secondary | ICD-10-CM

## 2020-06-06 DIAGNOSIS — Z8673 Personal history of transient ischemic attack (TIA), and cerebral infarction without residual deficits: Secondary | ICD-10-CM | POA: Insufficient documentation

## 2020-06-06 HISTORY — DX: Cerebral infarction, unspecified: I63.9

## 2020-06-07 ENCOUNTER — Ambulatory Visit: Payer: Medicare Other | Admitting: Neurology

## 2020-06-15 ENCOUNTER — Inpatient Hospital Stay (HOSPITAL_BASED_OUTPATIENT_CLINIC_OR_DEPARTMENT_OTHER)
Admission: EM | Admit: 2020-06-15 | Discharge: 2020-06-19 | DRG: 066 | Disposition: A | Discharge status: Rehabilitation Facility | Payer: Medicare Other | Attending: Internal Medicine | Admitting: Internal Medicine

## 2020-06-15 ENCOUNTER — Other Ambulatory Visit: Payer: Self-pay

## 2020-06-15 ENCOUNTER — Emergency Department (HOSPITAL_BASED_OUTPATIENT_CLINIC_OR_DEPARTMENT_OTHER): Payer: Medicare Other

## 2020-06-15 ENCOUNTER — Encounter (HOSPITAL_BASED_OUTPATIENT_CLINIC_OR_DEPARTMENT_OTHER): Payer: Self-pay | Admitting: *Deleted

## 2020-06-15 DIAGNOSIS — R001 Bradycardia, unspecified: Secondary | ICD-10-CM | POA: Diagnosis present

## 2020-06-15 DIAGNOSIS — Z20822 Contact with and (suspected) exposure to covid-19: Secondary | ICD-10-CM | POA: Diagnosis present

## 2020-06-15 DIAGNOSIS — R260 Ataxic gait: Secondary | ICD-10-CM | POA: Diagnosis present

## 2020-06-15 DIAGNOSIS — F329 Major depressive disorder, single episode, unspecified: Secondary | ICD-10-CM | POA: Diagnosis present

## 2020-06-15 DIAGNOSIS — Z9013 Acquired absence of bilateral breasts and nipples: Secondary | ICD-10-CM

## 2020-06-15 DIAGNOSIS — E785 Hyperlipidemia, unspecified: Secondary | ICD-10-CM

## 2020-06-15 DIAGNOSIS — G8929 Other chronic pain: Secondary | ICD-10-CM | POA: Diagnosis present

## 2020-06-15 DIAGNOSIS — I6389 Other cerebral infarction: Secondary | ICD-10-CM | POA: Diagnosis not present

## 2020-06-15 DIAGNOSIS — F411 Generalized anxiety disorder: Secondary | ICD-10-CM | POA: Diagnosis not present

## 2020-06-15 DIAGNOSIS — I10 Essential (primary) hypertension: Secondary | ICD-10-CM | POA: Diagnosis not present

## 2020-06-15 DIAGNOSIS — Z79899 Other long term (current) drug therapy: Secondary | ICD-10-CM | POA: Diagnosis not present

## 2020-06-15 DIAGNOSIS — G620 Drug-induced polyneuropathy: Secondary | ICD-10-CM | POA: Diagnosis present

## 2020-06-15 DIAGNOSIS — Z79811 Long term (current) use of aromatase inhibitors: Secondary | ICD-10-CM

## 2020-06-15 DIAGNOSIS — Z853 Personal history of malignant neoplasm of breast: Secondary | ICD-10-CM | POA: Diagnosis not present

## 2020-06-15 DIAGNOSIS — Z923 Personal history of irradiation: Secondary | ICD-10-CM | POA: Diagnosis not present

## 2020-06-15 DIAGNOSIS — T451X5S Adverse effect of antineoplastic and immunosuppressive drugs, sequela: Secondary | ICD-10-CM | POA: Diagnosis not present

## 2020-06-15 DIAGNOSIS — R29700 NIHSS score 0: Secondary | ICD-10-CM | POA: Diagnosis not present

## 2020-06-15 DIAGNOSIS — Z833 Family history of diabetes mellitus: Secondary | ICD-10-CM

## 2020-06-15 DIAGNOSIS — R42 Dizziness and giddiness: Secondary | ICD-10-CM | POA: Diagnosis not present

## 2020-06-15 DIAGNOSIS — I63543 Cerebral infarction due to unspecified occlusion or stenosis of bilateral cerebellar arteries: Principal | ICD-10-CM | POA: Diagnosis present

## 2020-06-15 DIAGNOSIS — I6502 Occlusion and stenosis of left vertebral artery: Secondary | ICD-10-CM | POA: Diagnosis present

## 2020-06-15 DIAGNOSIS — I639 Cerebral infarction, unspecified: Secondary | ICD-10-CM | POA: Diagnosis present

## 2020-06-15 DIAGNOSIS — R112 Nausea with vomiting, unspecified: Secondary | ICD-10-CM | POA: Diagnosis present

## 2020-06-15 DIAGNOSIS — Z7982 Long term (current) use of aspirin: Secondary | ICD-10-CM | POA: Diagnosis not present

## 2020-06-15 DIAGNOSIS — Z801 Family history of malignant neoplasm of trachea, bronchus and lung: Secondary | ICD-10-CM | POA: Diagnosis not present

## 2020-06-15 DIAGNOSIS — Z803 Family history of malignant neoplasm of breast: Secondary | ICD-10-CM

## 2020-06-15 DIAGNOSIS — Z8249 Family history of ischemic heart disease and other diseases of the circulatory system: Secondary | ICD-10-CM

## 2020-06-15 DIAGNOSIS — D649 Anemia, unspecified: Secondary | ICD-10-CM | POA: Diagnosis present

## 2020-06-15 DIAGNOSIS — C50219 Malignant neoplasm of upper-inner quadrant of unspecified female breast: Secondary | ICD-10-CM | POA: Diagnosis present

## 2020-06-15 DIAGNOSIS — C50212 Malignant neoplasm of upper-inner quadrant of left female breast: Secondary | ICD-10-CM | POA: Diagnosis present

## 2020-06-15 DIAGNOSIS — Z808 Family history of malignant neoplasm of other organs or systems: Secondary | ICD-10-CM

## 2020-06-15 DIAGNOSIS — Z8041 Family history of malignant neoplasm of ovary: Secondary | ICD-10-CM

## 2020-06-15 LAB — COMPREHENSIVE METABOLIC PANEL
ALT: 23 U/L (ref 0–44)
AST: 24 U/L (ref 15–41)
Albumin: 4.2 g/dL (ref 3.5–5.0)
Alkaline Phosphatase: 71 U/L (ref 38–126)
Anion gap: 9 (ref 5–15)
BUN: 14 mg/dL (ref 8–23)
CO2: 29 mmol/L (ref 22–32)
Calcium: 10.4 mg/dL — ABNORMAL HIGH (ref 8.9–10.3)
Chloride: 99 mmol/L (ref 98–111)
Creatinine, Ser: 1.09 mg/dL — ABNORMAL HIGH (ref 0.44–1.00)
GFR calc Af Amer: >60 mL/min (ref >60)
GFR calc non Af Amer: 53 mL/min — ABNORMAL LOW (ref >60)
Glucose, Bld: 105 mg/dL — ABNORMAL HIGH (ref 70–99)
Potassium: 4.1 mmol/L (ref 3.5–5.1)
Sodium: 137 mmol/L (ref 135–145)
Total Bilirubin: 0.4 mg/dL (ref 0.3–1.2)
Total Protein: 7.5 g/dL (ref 6.5–8.1)

## 2020-06-15 LAB — CBC
HCT: 35.7 % — ABNORMAL LOW (ref 36.0–46.0)
Hemoglobin: 11.5 g/dL — ABNORMAL LOW (ref 12.0–15.0)
MCH: 27.6 pg (ref 26.0–34.0)
MCHC: 32.2 g/dL (ref 30.0–36.0)
MCV: 85.6 fL (ref 80.0–100.0)
Platelets: 183 10*3/uL (ref 150–400)
RBC: 4.17 MIL/uL (ref 3.87–5.11)
RDW: 13.9 % (ref 11.5–15.5)
WBC: 6.5 10*3/uL (ref 4.0–10.5)
nRBC: 0 % (ref 0.0–0.2)

## 2020-06-15 LAB — PROTIME-INR
INR: 1 (ref 0.8–1.2)
Prothrombin Time: 13 seconds (ref 11.4–15.2)

## 2020-06-15 LAB — DIFFERENTIAL
Abs Immature Granulocytes: 0.01 10*3/uL (ref 0.00–0.07)
Basophils Absolute: 0 10*3/uL (ref 0.0–0.1)
Basophils Relative: 0 %
Eosinophils Absolute: 0 10*3/uL (ref 0.0–0.5)
Eosinophils Relative: 1 %
Immature Granulocytes: 0 %
Lymphocytes Relative: 45 %
Lymphs Abs: 2.9 10*3/uL (ref 0.7–4.0)
Monocytes Absolute: 0.4 10*3/uL (ref 0.1–1.0)
Monocytes Relative: 7 %
Neutro Abs: 3.1 10*3/uL (ref 1.7–7.7)
Neutrophils Relative %: 47 %

## 2020-06-15 LAB — SARS CORONAVIRUS 2 BY RT PCR (HOSPITAL ORDER, PERFORMED IN ~~LOC~~ HOSPITAL LAB): SARS Coronavirus 2: NEGATIVE

## 2020-06-15 LAB — APTT: aPTT: 27 seconds (ref 24–36)

## 2020-06-15 LAB — ETHANOL: Alcohol, Ethyl (B): <10 mg/dL (ref <10)

## 2020-06-15 LAB — CBG MONITORING, ED: Glucose-Capillary: 122 mg/dL — ABNORMAL HIGH (ref 70–99)

## 2020-06-15 MED ORDER — GABAPENTIN 400 MG PO CAPS
700.0000 mg | ORAL_CAPSULE | Freq: Two times a day (BID) | ORAL | Status: DC
  Administered 2020-06-16 – 2020-06-19 (×8): 700 mg via ORAL
  Filled 2020-06-15 (×10): qty 1

## 2020-06-15 MED ORDER — MECLIZINE HCL 25 MG PO TABS
25.0000 mg | ORAL_TABLET | Freq: Once | ORAL | Status: AC
  Administered 2020-06-15: 25 mg via ORAL
  Filled 2020-06-15: qty 1

## 2020-06-15 MED ORDER — HYDROCODONE-ACETAMINOPHEN 10-325 MG PO TABS
1.0000 | ORAL_TABLET | Freq: Four times a day (QID) | ORAL | Status: DC | PRN
  Filled 2020-06-15 (×2): qty 1

## 2020-06-15 MED ORDER — GABAPENTIN 800 MG PO TABS
800.0000 mg | ORAL_TABLET | Freq: Three times a day (TID) | ORAL | Status: DC
  Filled 2020-06-15 (×3): qty 1

## 2020-06-15 MED ORDER — LURASIDONE HCL 40 MG PO TABS
40.0000 mg | ORAL_TABLET | Freq: Every day | ORAL | Status: DC
  Administered 2020-06-16 – 2020-06-18 (×4): 40 mg via ORAL
  Filled 2020-06-15 (×7): qty 1

## 2020-06-15 MED ORDER — ASPIRIN EC 81 MG PO TBEC
81.0000 mg | DELAYED_RELEASE_TABLET | Freq: Every day | ORAL | Status: DC
  Administered 2020-06-15: 81 mg via ORAL
  Filled 2020-06-15 (×2): qty 1

## 2020-06-15 MED ORDER — TRAZODONE HCL 50 MG PO TABS
50.0000 mg | ORAL_TABLET | Freq: Every day | ORAL | Status: DC
  Administered 2020-06-16 – 2020-06-18 (×4): 50 mg via ORAL
  Filled 2020-06-15 (×4): qty 1

## 2020-06-15 MED ORDER — IRBESARTAN 300 MG PO TABS
300.0000 mg | ORAL_TABLET | Freq: Every day | ORAL | Status: DC
  Administered 2020-06-16 – 2020-06-19 (×4): 300 mg via ORAL
  Filled 2020-06-15 (×6): qty 1

## 2020-06-15 MED ORDER — HYDROCHLOROTHIAZIDE 25 MG PO TABS
25.0000 mg | ORAL_TABLET | Freq: Every day | ORAL | Status: DC
  Administered 2020-06-15: 25 mg via ORAL
  Filled 2020-06-15: qty 1

## 2020-06-15 MED ORDER — KETOROLAC TROMETHAMINE 60 MG/2ML IM SOLN
30.0000 mg | Freq: Once | INTRAMUSCULAR | Status: AC
  Administered 2020-06-15: 30 mg via INTRAMUSCULAR
  Filled 2020-06-15: qty 2

## 2020-06-15 NOTE — ED Provider Notes (Addendum)
[facility] EMERGENCY DEPARTMENT Provider Note   CSN: [id number] Arrival date & time: 06/15/20  1400     History Chief Complaint  Patient presents with  . Dizziness    Kristi [name] is a 64 y.o. female.  HPI Patient reports that she had an episode of dizziness and mild to moderate headache Sunday (2 days ago).  She reports that the dizziness has a spinning quality to it.  She reports she has felt nauseated.  She has been able to eat.  She reports that the headache did not keep her awake at night.  Its aching and frontal in nature.  She reports she does not typically get headaches.  She reports today she felt the dizziness was more persistent and headache was more persistent.  No visual changes.  No focal weakness numbness or tingling of extremities.  No fevers, no chills, no sinus pressure, no dental pain or sore throat.  Patient takes Vicodin twice a day for chronic pain.  She reports this did not do anything to help her headache.  Patient has completed Covid vaccination.    Past Medical History:  Diagnosis Date  . Anemia    Iron deficinecy anemia  . Anemia 05/14/2017  . Anxiety   . Anxiety and depression 05/15/2014  . Arthritis   . Breast cancer ([place])    left ,last radiation 2'15, last chemo 8'14  . Constipation 11/13/2016  . Depression   . History of chicken pox   . History of radiation therapy 09/09/13-10/28/13   44 gray to left breast, lumpectomy cavity boosted to 63 gray  . HTN (hypertension) 11/13/2016  . Hyperglycemia 01/09/2016  . Hyperlipidemia 05/14/2017  . Hypertension   . MRSA (methicillin resistant Staphylococcus aureus) 2009   right groin area-no issues now. 04-07-14 PCR screen negative today.  . Neuropathy   . Preventative health care 11/13/2016    Patient Active Problem List   Diagnosis Date Noted  . Debility 01/18/2020  . Light headed 01/18/2020  . Mild cognitive impairment 08/24/2019  . Genetic testing 02/20/2019  . S/P mastectomy, right 12/26/2018   . Left upper extremity numbness 06/19/2018  . Cervical cancer screening 06/18/2018  . Anemia 05/14/2017  . Hyperlipidemia 05/14/2017  . History of left breast cancer 02/15/2017  . Nodule of finger of left hand 11/14/2016  . Right hip pain 11/14/2016  . Knee pain, bilateral 11/14/2016  . Constipation 11/13/2016  . Preventative health care 11/13/2016  . HTN (hypertension) 11/13/2016  . Hyperglycemia 01/09/2016  . Neuropathy of hand, left 09/21/2015  . History of chicken pox   . Shingles 05/15/2014  . Anxiety and depression 05/15/2014  . Rotator cuff tear 02/24/2014  . Left shoulder pain with abduction 12/12/2013  . CVA (cerebral infarction) 07/18/2013  . TIA (transient ischemic attack) 07/18/2013  . Contracture of axilla 05/20/2013  . Primary cancer of upper inner quadrant of left female breast ([place]) 01/24/2013  . Anal fissure 05/03/2011  . Hemorrhoids, internal, with prolapse 05/03/2011    Past Surgical History:  Procedure Laterality Date  . ANAL SPHINCTEROTOMY  04/2011  . APPENDECTOMY  1980  . AXILLARY LYMPH NODE DISSECTION Left 02/04/2013   Procedure: LEFT AXILLARY LYMPH NODE DISSECTION;  Surgeon: Stark Klein, MD;  Location: [facility];  Service: General;  Laterality: Left;  End: 4580  . BREAST LUMPECTOMY WITH NEEDLE LOCALIZATION Left 02/04/2013   Procedure: LEFT BREAST LUMPECTOMY WITH NEEDLE LOCALIZATION;  Surgeon: Stark Klein, MD;  Location: [facility];  Service: General;  Laterality:  Left;  . BREAST SURGERY     Lumpectomy in april 2014  . HEMORRHOID SURGERY  04/2011   ligation  . MASTECTOMY Left 02/15/2017  . PORT-A-CATH REMOVAL N/A 04/16/2014   Procedure: REMOVAL PORT-A-CATH;  Surgeon: Stark Klein, MD;  Location: [facility];  Service: General;  Laterality: N/A;  . PORTACATH PLACEMENT Right 02/04/2013   Procedure: INSERTION PORT-A-CATH;  Surgeon: Stark Klein, MD;  Location: [facility];  Service: General;  Laterality: Right;  Start Time: 7902.  Marland Kitchen SHOULDER ARTHROSCOPY WITH ROTATOR CUFF REPAIR  AND SUBACROMIAL DECOMPRESSION Left 02/24/2014   Procedure: SHOULDER ARTHROSCOPY WITH ROTATOR CUFF REPAIR AND SUBACROMIAL DECOMPRESSION;  Surgeon: Meredith Pel, MD;  Location: [facility];  Service: Orthopedics;  Laterality: Left;  LEFT SHOULDER DIAGNOSTIC OPERATIVE ARTHROSCOPY, SUBACROMIAL DECOMPRESSION, ROTATOR CUFF TEAR REPAIR  . SIMPLE MASTECTOMY WITH AXILLARY SENTINEL NODE BIOPSY Left 02/15/2017   Procedure: LEFT MASTECTOMY;  Surgeon: Stark Klein, MD;  Location: [facility];  Service: General;  Laterality: Left;  . TOTAL MASTECTOMY Right 12/26/2018   Procedure: RIGHT BREAST PROPHYLATIC MASTECTOMY;  Surgeon: Stark Klein, MD;  Location: [facility];  Service: General;  Laterality: Right;     OB History   No obstetric history on file.     Family History  Problem Relation Age of Onset  . Lung cancer Father   . Hypertension Father   . Thyroid cancer Father        dx in his 39s  . Cancer Father        lung, thyroid, smoker  . Breast cancer Paternal Aunt 10  . Colon cancer Paternal Aunt        dx in her 50x  . Cervical cancer Paternal Aunt        dzx in her 66s  . Ovarian cancer Cousin        dx in her lage 6s  . Breast cancer Cousin        maternal first cousin, once removed; dx in her late 67s  . Breast cancer Cousin        maternal first cousin once removed; dx in late 11s  . Hypertension Mother   . Diabetes Mother   . Dementia Mother   . Memory loss Mother   . Hypertension Brother   . Seizures Brother        Alcohol induced.  . Alcohol abuse Brother        drinker, smoker  . Cancer Paternal Uncle        oral cancer  . Kidney cancer Paternal Grandmother   . Arthritis Daughter        back surgery  . Cancer Cousin        several paternal cousins with brain cancer, leukemia, and other cancers  . Cancer Sister        stomach    Social History   Tobacco Use  . Smoking status: Never Smoker  . Smokeless tobacco: Never Used  Vaping Use  . Vaping Use: Never used  Substance Use  Topics  . Alcohol use: No  . Drug use: No    Home Medications Prior to Admission medications   Medication Sig Start Date End Date Taking? Authorizing Provider  anastrozole (ARIMIDEX) 1 MG tablet Take 1 tablet (1 mg total) by mouth daily. TAKE 1 TABLET (1 MG) BY MOUTH DAILY IN THE MORNING 05/21/20   Nicholas Lose, MD  Apoaequorin (PREVAGEN) 10 MG CAPS Take 1 capsule by mouth daily. 05/21/20   Nicholas Lose, MD  aspirin  EC 81 MG tablet Take 1 tablet (81 mg total) by mouth daily. Patient not taking: Reported on 05/26/2020 06/19/19   Mosie Lukes, MD  gabapentin (NEURONTIN) 800 MG tablet Take 1 tablet (800 mg total) by mouth 3 (three) times daily. 05/05/20   Raulkar, Clide Deutscher, MD  HYDROcodone-acetaminophen (NORCO) 10-325 MG tablet Take 1 tablet by mouth every 6 (six) hours as needed. 05/19/20   Raulkar, Clide Deutscher, MD  hydrOXYzine (ATARAX/VISTARIL) 25 MG tablet Take 25 mg by mouth 2 (two) times daily. 25 to 75 mg daily 10/01/19   [provider]  KLOR-CON M20 20 MEQ tablet TAKE 1 TABLET BY MOUTH EVERY DAY Patient not taking: Reported on 05/26/2020 05/16/19   Mosie Lukes, MD  LATUDA 80 MG TABS tablet Take 0.5 tablets (40 mg total) by mouth at bedtime. 05/21/20   Nicholas Lose, MD  magnesium 30 MG tablet Take 250 mg by mouth once.    [provider]  Multiple Vitamin (MULTIVITAMIN WITH MINERALS) TABS tablet Take 1 tablet by mouth daily.    [provider]  traZODone (DESYREL) 50 MG tablet  04/25/20   [provider]  valsartan-hydrochlorothiazide (DIOVAN-HCT) 320-25 MG tablet TAKE 1 TABLET BY MOUTH EVERY DAY IN THE MORNING 12/16/19   Mosie Lukes, MD  prochlorperazine (COMPAZINE) 10 MG tablet Take 1 tablet (10 mg total) by mouth every 6 (six) hours as needed (Nausea or vomiting). 05/07/13 12/08/19  Gardenia Phlegm, NP  prochlorperazine (COMPAZINE) 25 MG suppository Place 1 suppository (25 mg total) rectally every 12 (twelve) hours as needed for nausea. 05/07/13  07/24/13  Gardenia Phlegm, NP    Allergies    Patient has no known allergies.  Review of Systems   Review of Systems 10 systems reviewed and negative except as per HPI Physical Exam Updated Vital Signs BP (!) 114/59   Pulse 62   Temp 98.7 F (37.1 C) (Oral)   Resp 20   Ht 5\' 2"  (1.575 m)   Wt 73 kg   LMP 01/20/2013   SpO2 100%   BMI 29.44 kg/m   Physical Exam Constitutional:      Comments: Patient is alert and nontoxic.  No respiratory distress.  Mental status clear.  HENT:     Head: Normocephalic and atraumatic.     Ears:     Comments: Bilateral cerumen impaction.    Nose: Nose normal.     Mouth/Throat:     Mouth: Mucous membranes are moist.     Pharynx: Oropharynx is clear.  Eyes:     Extraocular Movements: Extraocular movements intact.     Conjunctiva/sclera: Conjunctivae normal.     Pupils: Pupils are equal, round, and reactive to light.  Cardiovascular:     Rate and Rhythm: Normal rate and regular rhythm.  Pulmonary:     Effort: Pulmonary effort is normal.     Breath sounds: Normal breath sounds.  Abdominal:     General: There is no distension.     Palpations: Abdomen is soft.     Tenderness: There is no abdominal tenderness. There is no guarding.  Musculoskeletal:        General: No swelling or tenderness. Normal range of motion.     Cervical back: Neck supple.     Right lower leg: No edema.     Left lower leg: No edema.  Skin:    General: Skin is warm and dry.  Neurological:     Comments: Patient is alert.  Speech  is clear.  No aphasia.  Extraocular motions intact.  Finger-nose exam intact bilaterally.  No focal motor weakness upper or lower extremity.  And sensation intact light touch bilaterally.  Psychiatric:        Mood and Affect: Mood normal.     Comments: Affect is slightly flat.     ED Results / Procedures / Treatments   Labs (all labs ordered are listed, but only abnormal results are displayed) Labs Reviewed  CBG MONITORING, ED  - Abnormal; Notable for the following components:      Result Value   Glucose-Capillary 122 (*)    All other components within normal limits  SARS CORONAVIRUS 2 BY RT PCR (HOSPITAL ORDER, [facility])  ETHANOL  PROTIME-INR  APTT  CBC  DIFFERENTIAL  COMPREHENSIVE METABOLIC PANEL  RAPID URINE DRUG SCREEN, HOSP PERFORMED  URINALYSIS, ROUTINE W REFLEX MICROSCOPIC  CBG MONITORING, ED    EKG None  Radiology CT Head Wo Contrast  Result Date: 06/15/2020 CLINICAL DATA:  Dizziness EXAM: CT HEAD WITHOUT CONTRAST TECHNIQUE: Contiguous axial images were obtained from the base of the skull through the vertex without intravenous contrast. COMPARISON:  MRI 10/08/2019 FINDINGS: Brain: There is area of low-density noted in the inferior left cerebellar hemisphere compatible with acute infarct. Subtle low-density noted in the left temporal, parietal and occipital lobes which I favor is artifactual, but given the acute infarct in the left cerebellum, cannot completely exclude infarct. No hemorrhage or hydrocephalus. Vascular: No hyperdense vessel or unexpected calcification. Skull: No acute calvarial abnormality. Sinuses/Orbits: Visualized paranasal sinuses and mastoids clear. Orbital soft tissues unremarkable. Other: None IMPRESSION: Area of low-density in the inferior left cerebellar hemisphere compatible with acute infarction. Subtle decreased attenuation throughout much of the left temporal, parietal and occipital lobes which I favor is artifactual. No hemorrhage. Electronically Signed   By: Rolm Baptise M.D.   On: 06/15/2020 18:35    Procedures Procedures (including critical care time)  Medications Ordered in ED Medications  meclizine (ANTIVERT) tablet 25 mg (25 mg Oral Given 06/15/20 1809)  ketorolac (TORADOL) injection 30 mg (30 mg Intramuscular Given 06/15/20 1810)    ED Course  I have reviewed the triage vital signs and the nursing notes.  Pertinent labs & imaging  results that were available during my care of the patient were reviewed by me and considered in my medical decision making (see chart for details).  Clinical Course as of Jun 20 714  Tue Jun 15, 2020  1956 Consult: Reviewed Dr. Cheral Marker neurology.  Advises patient needs MRI and admission to hospitalist service for stroke work-up.   [MP]  2001 Consult: Dr. Marlowe Sax admission for [facility] hospitalist.   [MP]    Clinical Course User Index [MP] Charlesetta Shanks, MD   MDM Rules/Calculators/A&P                          Patient symptoms started 2 days ago with vertigo-like symptoms.  she has also had some frontal pressure headache.  She does not rate the headache is severe.  Headache has waxed and waned.  She however notes that the vertigo was worse and more persistent today.  No other focal deficits.  CT head does show area suspicious for acute cerebellar infarct.  Patient has prior history of CVA documented.  Patient has had CT angiogram within the past year.  No aneurysms present at that time.  Patient's headache quality and severity do not sound suggestive  of aneurysm.  At initial evaluation, I had higher suspicion for peripheral vertigo with tension headache.  Patient was given Toradol IM and Antivert for symptomatic relief while pursuing CT scan.  CT scan does return with positive findings.  Patient is normotensive with sinus rhythm in the 60s.  Will consult neurology and hospitalist. Final Clinical Impression(s) / ED Diagnoses Final diagnoses:  Cerebrovascular accident (CVA), unspecified mechanism ([place])  Vertigo    Rx / DC Orders ED Discharge Orders    None       Charlesetta Shanks, MD 06/15/20 Darlin Drop    Charlesetta Shanks, MD 06/20/20 [id number]

## 2020-06-15 NOTE — ED Notes (Signed)
Patient transported to CT 

## 2020-06-15 NOTE — ED Triage Notes (Signed)
Dizziness off and on x 2 days. No pain. She is alert oriented.

## 2020-06-15 NOTE — ED Notes (Signed)
Pt aware urine specimen ordered

## 2020-06-16 ENCOUNTER — Inpatient Hospital Stay (HOSPITAL_COMMUNITY): Payer: Medicare Other

## 2020-06-16 ENCOUNTER — Encounter (HOSPITAL_COMMUNITY): Payer: Self-pay | Admitting: Internal Medicine

## 2020-06-16 DIAGNOSIS — R001 Bradycardia, unspecified: Secondary | ICD-10-CM | POA: Diagnosis present

## 2020-06-16 DIAGNOSIS — Z8051 Family history of malignant neoplasm of kidney: Secondary | ICD-10-CM | POA: Diagnosis not present

## 2020-06-16 DIAGNOSIS — G620 Drug-induced polyneuropathy: Secondary | ICD-10-CM | POA: Diagnosis not present

## 2020-06-16 DIAGNOSIS — D62 Acute posthemorrhagic anemia: Secondary | ICD-10-CM | POA: Diagnosis not present

## 2020-06-16 DIAGNOSIS — Z833 Family history of diabetes mellitus: Secondary | ICD-10-CM | POA: Diagnosis not present

## 2020-06-16 DIAGNOSIS — Z853 Personal history of malignant neoplasm of breast: Secondary | ICD-10-CM | POA: Diagnosis not present

## 2020-06-16 DIAGNOSIS — T451X5S Adverse effect of antineoplastic and immunosuppressive drugs, sequela: Secondary | ICD-10-CM | POA: Diagnosis not present

## 2020-06-16 DIAGNOSIS — Z923 Personal history of irradiation: Secondary | ICD-10-CM | POA: Diagnosis not present

## 2020-06-16 DIAGNOSIS — C50212 Malignant neoplasm of upper-inner quadrant of left female breast: Secondary | ICD-10-CM

## 2020-06-16 DIAGNOSIS — I6389 Other cerebral infarction: Secondary | ICD-10-CM | POA: Diagnosis not present

## 2020-06-16 DIAGNOSIS — R29700 NIHSS score 0: Secondary | ICD-10-CM | POA: Diagnosis present

## 2020-06-16 DIAGNOSIS — I10 Essential (primary) hypertension: Secondary | ICD-10-CM | POA: Diagnosis not present

## 2020-06-16 DIAGNOSIS — T451X5A Adverse effect of antineoplastic and immunosuppressive drugs, initial encounter: Secondary | ICD-10-CM | POA: Diagnosis not present

## 2020-06-16 DIAGNOSIS — R42 Dizziness and giddiness: Secondary | ICD-10-CM | POA: Diagnosis not present

## 2020-06-16 DIAGNOSIS — Z7902 Long term (current) use of antithrombotics/antiplatelets: Secondary | ICD-10-CM | POA: Diagnosis not present

## 2020-06-16 DIAGNOSIS — I63233 Cerebral infarction due to unspecified occlusion or stenosis of bilateral carotid arteries: Secondary | ICD-10-CM | POA: Diagnosis not present

## 2020-06-16 DIAGNOSIS — F411 Generalized anxiety disorder: Secondary | ICD-10-CM

## 2020-06-16 DIAGNOSIS — Z808 Family history of malignant neoplasm of other organs or systems: Secondary | ICD-10-CM | POA: Diagnosis not present

## 2020-06-16 DIAGNOSIS — R112 Nausea with vomiting, unspecified: Secondary | ICD-10-CM | POA: Diagnosis present

## 2020-06-16 DIAGNOSIS — Z800 Family history of malignant neoplasm of digestive organs: Secondary | ICD-10-CM | POA: Diagnosis not present

## 2020-06-16 DIAGNOSIS — Z7982 Long term (current) use of aspirin: Secondary | ICD-10-CM | POA: Diagnosis not present

## 2020-06-16 DIAGNOSIS — I639 Cerebral infarction, unspecified: Secondary | ICD-10-CM

## 2020-06-16 DIAGNOSIS — G8929 Other chronic pain: Secondary | ICD-10-CM | POA: Diagnosis present

## 2020-06-16 DIAGNOSIS — Z8249 Family history of ischemic heart disease and other diseases of the circulatory system: Secondary | ICD-10-CM | POA: Diagnosis not present

## 2020-06-16 DIAGNOSIS — Z79811 Long term (current) use of aromatase inhibitors: Secondary | ICD-10-CM | POA: Diagnosis not present

## 2020-06-16 DIAGNOSIS — E785 Hyperlipidemia, unspecified: Secondary | ICD-10-CM | POA: Diagnosis not present

## 2020-06-16 DIAGNOSIS — Z79899 Other long term (current) drug therapy: Secondary | ICD-10-CM | POA: Diagnosis not present

## 2020-06-16 DIAGNOSIS — I63543 Cerebral infarction due to unspecified occlusion or stenosis of bilateral cerebellar arteries: Secondary | ICD-10-CM | POA: Diagnosis present

## 2020-06-16 DIAGNOSIS — I69393 Ataxia following cerebral infarction: Secondary | ICD-10-CM | POA: Diagnosis not present

## 2020-06-16 DIAGNOSIS — Z9013 Acquired absence of bilateral breasts and nipples: Secondary | ICD-10-CM | POA: Diagnosis not present

## 2020-06-16 DIAGNOSIS — Z20822 Contact with and (suspected) exposure to covid-19: Secondary | ICD-10-CM | POA: Diagnosis present

## 2020-06-16 DIAGNOSIS — Z803 Family history of malignant neoplasm of breast: Secondary | ICD-10-CM | POA: Diagnosis not present

## 2020-06-16 DIAGNOSIS — R260 Ataxic gait: Secondary | ICD-10-CM | POA: Diagnosis present

## 2020-06-16 DIAGNOSIS — I6502 Occlusion and stenosis of left vertebral artery: Secondary | ICD-10-CM | POA: Diagnosis present

## 2020-06-16 DIAGNOSIS — F329 Major depressive disorder, single episode, unspecified: Secondary | ICD-10-CM | POA: Diagnosis present

## 2020-06-16 DIAGNOSIS — Z801 Family history of malignant neoplasm of trachea, bronchus and lung: Secondary | ICD-10-CM | POA: Diagnosis not present

## 2020-06-16 DIAGNOSIS — I63542 Cerebral infarction due to unspecified occlusion or stenosis of left cerebellar artery: Secondary | ICD-10-CM | POA: Diagnosis not present

## 2020-06-16 DIAGNOSIS — D649 Anemia, unspecified: Secondary | ICD-10-CM | POA: Diagnosis present

## 2020-06-16 LAB — ECHOCARDIOGRAM COMPLETE
Area-P 1/2: 4.39 cm2
Calc EF: 53.9 %
Height: 62 in
S' Lateral: 3.7 cm
Single Plane A2C EF: 54.5 %
Single Plane A4C EF: 58.4 %
Weight: 2546.75 oz

## 2020-06-16 LAB — LIPID PANEL
Cholesterol: 144 mg/dL (ref 0–200)
HDL: 41 mg/dL (ref >40)
LDL Cholesterol: 89 mg/dL (ref 0–99)
Total CHOL/HDL Ratio: 3.5 RATIO
Triglycerides: 68 mg/dL (ref <150)
VLDL: 14 mg/dL (ref 0–40)

## 2020-06-16 LAB — HEMOGLOBIN A1C
Hgb A1c MFr Bld: 5.9 % — ABNORMAL HIGH (ref 4.8–5.6)
Mean Plasma Glucose: 122.63 mg/dL

## 2020-06-16 LAB — CBC
HCT: 33 % — ABNORMAL LOW (ref 36.0–46.0)
Hemoglobin: 10.6 g/dL — ABNORMAL LOW (ref 12.0–15.0)
MCH: 27.5 pg (ref 26.0–34.0)
MCHC: 32.1 g/dL (ref 30.0–36.0)
MCV: 85.5 fL (ref 80.0–100.0)
Platelets: 161 10*3/uL (ref 150–400)
RBC: 3.86 MIL/uL — ABNORMAL LOW (ref 3.87–5.11)
RDW: 14.1 % (ref 11.5–15.5)
WBC: 6.1 10*3/uL (ref 4.0–10.5)
nRBC: 0 % (ref 0.0–0.2)

## 2020-06-16 LAB — HIV ANTIBODY (ROUTINE TESTING W REFLEX): HIV Screen 4th Generation wRfx: NONREACTIVE

## 2020-06-16 LAB — CREATININE, SERUM
Creatinine, Ser: 1.19 mg/dL — ABNORMAL HIGH (ref 0.44–1.00)
GFR calc Af Amer: 55 mL/min — ABNORMAL LOW (ref >60)
GFR calc non Af Amer: 48 mL/min — ABNORMAL LOW (ref >60)

## 2020-06-16 MED ORDER — ENOXAPARIN SODIUM 40 MG/0.4ML ~~LOC~~ SOLN
40.0000 mg | SUBCUTANEOUS | Status: DC
  Administered 2020-06-16 – 2020-06-19 (×4): 40 mg via SUBCUTANEOUS
  Filled 2020-06-16 (×4): qty 0.4

## 2020-06-16 MED ORDER — IOHEXOL 350 MG/ML SOLN
80.0000 mL | Freq: Once | INTRAVENOUS | Status: AC | PRN
  Administered 2020-06-16: 80 mL via INTRAVENOUS

## 2020-06-16 MED ORDER — ASPIRIN 325 MG PO TABS
325.0000 mg | ORAL_TABLET | Freq: Every day | ORAL | Status: DC
  Filled 2020-06-16: qty 1

## 2020-06-16 MED ORDER — ATORVASTATIN CALCIUM 40 MG PO TABS
40.0000 mg | ORAL_TABLET | Freq: Every day | ORAL | Status: DC
  Administered 2020-06-16 – 2020-06-19 (×4): 40 mg via ORAL
  Filled 2020-06-16 (×4): qty 1

## 2020-06-16 MED ORDER — ACETAMINOPHEN 650 MG RE SUPP: 650.0000 mg | RECTAL | Status: DC | PRN

## 2020-06-16 MED ORDER — ACETAMINOPHEN 325 MG PO TABS: 650.0000 mg | ORAL_TABLET | ORAL | Status: DC | PRN

## 2020-06-16 MED ORDER — CLOPIDOGREL BISULFATE 75 MG PO TABS
300.0000 mg | ORAL_TABLET | Freq: Once | ORAL | Status: AC
  Administered 2020-06-16: 300 mg via ORAL
  Filled 2020-06-16: qty 4

## 2020-06-16 MED ORDER — STROKE: EARLY STAGES OF RECOVERY BOOK
Freq: Once | Status: AC
  Filled 2020-06-16: qty 1

## 2020-06-16 MED ORDER — ASPIRIN 300 MG RE SUPP: 300.0000 mg | Freq: Every day | RECTAL | Status: DC

## 2020-06-16 MED ORDER — ACETAMINOPHEN 160 MG/5ML PO SOLN: 650.0000 mg | ORAL | Status: DC | PRN

## 2020-06-16 MED ORDER — CLOPIDOGREL BISULFATE 75 MG PO TABS
75.0000 mg | ORAL_TABLET | Freq: Every day | ORAL | Status: DC
  Administered 2020-06-17 – 2020-06-19 (×3): 75 mg via ORAL
  Filled 2020-06-16 (×3): qty 1

## 2020-06-16 MED ORDER — HYDROCODONE-ACETAMINOPHEN 10-325 MG PO TABS
1.0000 | ORAL_TABLET | Freq: Four times a day (QID) | ORAL | Status: DC | PRN
  Administered 2020-06-16 – 2020-06-19 (×7): 1 via ORAL
  Filled 2020-06-16 (×6): qty 1

## 2020-06-16 MED ORDER — LORAZEPAM 2 MG/ML IJ SOLN
0.5000 mg | Freq: Once | INTRAMUSCULAR | Status: DC | PRN
  Filled 2020-06-16: qty 1

## 2020-06-16 MED ORDER — ASPIRIN EC 325 MG PO TBEC
325.0000 mg | DELAYED_RELEASE_TABLET | Freq: Every day | ORAL | Status: DC
  Administered 2020-06-16 – 2020-06-19 (×4): 325 mg via ORAL
  Filled 2020-06-16 (×4): qty 1

## 2020-06-16 MED ORDER — SODIUM CHLORIDE 0.9 % IV SOLN: INTRAVENOUS | Status: AC

## 2020-06-16 NOTE — Progress Notes (Signed)
  Echocardiogram 2D Echocardiogram has been performed.  Michiel Cowboy 06/16/2020, 2:12 PM

## 2020-06-16 NOTE — Consult Note (Signed)
Referring Physician: Dr. Hal Hope    Chief Complaint: Subacute cerebellar infarction  HPI: Kristi [name] is an 64 y.o. female with a PMHx of breast cancer in remission, neuropathy, anxiety, depression, HTN and HLD who presented to the [facility] ED with chief complaints of gait unsteadiness, vertigo,nausea and mild to moderate headache. She denied visual changes, focal weakness, numbness and paresthesia. Also with no fevers, no chills, no sinus pressure, no dental pain and no sore throat.   CT head at OSH revealed a subacute left cerebellar ischemic infarction. She was transferred to [facility] for stroke work up.   LSN: Sunday tPA Given: No: Out of time window.   Past Medical History:  Diagnosis Date  . Anemia    Iron deficinecy anemia  . Anemia 05/14/2017  . Anxiety   . Anxiety and depression 05/15/2014  . Arthritis   . Breast cancer ([place])    left ,last radiation 2'15, last chemo 8'14  . Constipation 11/13/2016  . Depression   . History of chicken pox   . History of radiation therapy 09/09/13-10/28/13   45 gray to left breast, lumpectomy cavity boosted to 63 gray  . HTN (hypertension) 11/13/2016  . Hyperglycemia 01/09/2016  . Hyperlipidemia 05/14/2017  . Hypertension   . MRSA (methicillin resistant Staphylococcus aureus) 2009   right groin area-no issues now. 04-07-14 PCR screen negative today.  . Neuropathy   . Preventative health care 11/13/2016    Past Surgical History:  Procedure Laterality Date  . ANAL SPHINCTEROTOMY  04/2011  . APPENDECTOMY  1980  . AXILLARY LYMPH NODE DISSECTION Left 02/04/2013   Procedure: LEFT AXILLARY LYMPH NODE DISSECTION;  Surgeon: Stark Klein, MD;  Location: [place];  Service: General;  Laterality: Left;  End: 3500  . BREAST LUMPECTOMY WITH NEEDLE LOCALIZATION Left 02/04/2013   Procedure: LEFT BREAST LUMPECTOMY WITH NEEDLE LOCALIZATION;  Surgeon: Stark Klein, MD;  Location: [place];  Service: General;  Laterality: Left;  . BREAST SURGERY     Lumpectomy in april 2014  .  HEMORRHOID SURGERY  04/2011   ligation  . MASTECTOMY Left 02/15/2017  . PORT-A-CATH REMOVAL N/A 04/16/2014   Procedure: REMOVAL PORT-A-CATH;  Surgeon: Stark Klein, MD;  Location: [facility];  Service: General;  Laterality: N/A;  . PORTACATH PLACEMENT Right 02/04/2013   Procedure: INSERTION PORT-A-CATH;  Surgeon: Stark Klein, MD;  Location: [place];  Service: General;  Laterality: Right;  Start Time: 9381.  Marland Kitchen SHOULDER ARTHROSCOPY WITH ROTATOR CUFF REPAIR AND SUBACROMIAL DECOMPRESSION Left 02/24/2014   Procedure: SHOULDER ARTHROSCOPY WITH ROTATOR CUFF REPAIR AND SUBACROMIAL DECOMPRESSION;  Surgeon: Meredith Pel, MD;  Location: [place];  Service: Orthopedics;  Laterality: Left;  LEFT SHOULDER DIAGNOSTIC OPERATIVE ARTHROSCOPY, SUBACROMIAL DECOMPRESSION, ROTATOR CUFF TEAR REPAIR  . SIMPLE MASTECTOMY WITH AXILLARY SENTINEL NODE BIOPSY Left 02/15/2017   Procedure: LEFT MASTECTOMY;  Surgeon: Stark Klein, MD;  Location: [place];  Service: General;  Laterality: Left;  . TOTAL MASTECTOMY Right 12/26/2018   Procedure: RIGHT BREAST PROPHYLATIC MASTECTOMY;  Surgeon: Stark Klein, MD;  Location: [place];  Service: General;  Laterality: Right;    Family History  Problem Relation Age of Onset  . Lung cancer Father   . Hypertension Father   . Thyroid cancer Father        dx in his 18s  . Cancer Father        lung, thyroid, smoker  . Breast cancer Paternal Aunt 54  . Colon cancer Paternal Aunt        dx  in her 50x  . Cervical cancer Paternal Aunt        dzx in her 51s  . Ovarian cancer Cousin        dx in her lage 67s  . Breast cancer Cousin        maternal first cousin, once removed; dx in her late 84s  . Breast cancer Cousin        maternal first cousin once removed; dx in late 84s  . Hypertension Mother   . Diabetes Mother   . Dementia Mother   . Memory loss Mother   . Hypertension Brother   . Seizures Brother        Alcohol induced.  . Alcohol abuse Brother        drinker, smoker  . Cancer  Paternal Uncle        oral cancer  . Kidney cancer Paternal Grandmother   . Arthritis Daughter        back surgery  . Cancer Cousin        several paternal cousins with brain cancer, leukemia, and other cancers  . Cancer Sister        stomach   Social History:  reports that she has never smoked. She has never used smokeless tobacco. She reports that she does not drink alcohol and does not use drugs.  Allergies: No Known Allergies  Medications:  Prior to Admission:  Medications Prior to Admission  Medication Sig Dispense Refill Last Dose  . anastrozole (ARIMIDEX) 1 MG tablet Take 1 tablet (1 mg total) by mouth daily. TAKE 1 TABLET (1 MG) BY MOUTH DAILY IN THE MORNING 90 tablet 3   . Apoaequorin (PREVAGEN) 10 MG CAPS Take 1 capsule by mouth daily.     Marland Kitchen aspirin EC 81 MG tablet Take 1 tablet (81 mg total) by mouth daily. (Patient not taking: Reported on 05/26/2020)     . gabapentin (NEURONTIN) 800 MG tablet Take 1 tablet (800 mg total) by mouth 3 (three) times daily. 90 tablet 3   . HYDROcodone-acetaminophen (NORCO) 10-325 MG tablet Take 1 tablet by mouth every 6 (six) hours as needed. 180 tablet 0   . hydrOXYzine (ATARAX/VISTARIL) 25 MG tablet Take 25 mg by mouth 2 (two) times daily. 25 to 75 mg daily     . KLOR-CON M20 20 MEQ tablet TAKE 1 TABLET BY MOUTH EVERY DAY (Patient not taking: Reported on 05/26/2020) 90 tablet 1   . LATUDA 80 MG TABS tablet Take 0.5 tablets (40 mg total) by mouth at bedtime. 30 tablet 1   . magnesium 30 MG tablet Take 250 mg by mouth once.     . Multiple Vitamin (MULTIVITAMIN WITH MINERALS) TABS tablet Take 1 tablet by mouth daily.     . traZODone (DESYREL) 50 MG tablet      . valsartan-hydrochlorothiazide (DIOVAN-HCT) 320-25 MG tablet TAKE 1 TABLET BY MOUTH EVERY DAY IN THE MORNING 90 tablet 1    Scheduled: . aspirin  300 mg Rectal Daily   Or  . aspirin  325 mg Oral Daily  . enoxaparin (LOVENOX) injection  40 mg Subcutaneous Q24H  . gabapentin  700 mg Oral  BID  . irbesartan  300 mg Oral Daily  . lurasidone  40 mg Oral QHS  . traZODone  50 mg Oral QHS    ROS: As per HPI. Comprehensive ROS otherwise negative.   Physical Examination: Blood pressure 120/68, pulse 65, temperature 98.7 F (37.1 C), temperature source Oral, resp. rate 13,  height 5\' 2"  (1.575 m), weight 72.2 kg, last menstrual period 01/20/2013, SpO2 100 %.  HEENT: [place]/AT Lungs: Respirations unlabored Ext: No edema  Neurologic Examination: Mental Status: Alert, fully oriented, thought content appropriate.  Speech fluent without evidence of aphasia.  Able to follow all commands without difficulty. Cranial Nerves: II:  Visual fields intact with no extinction to DSS. PERRL.  III,IV, VI: No ptosis. EOMI. No nystagmus.   V,VII: Smile symmetric, facial temp sensation equal bilaterally VIII: hearing intact to voice IX,X: No hypophonia or hoarseness XI: Symmetric XII: midline tongue extension  Motor: Right : Upper extremity   5/5    Left:     Upper extremity   5/5  Lower extremity   5/5     Lower extremity   5/5 No pronator drift Sensory: Temp and light touch intact throughout, bilaterally Deep Tendon Reflexes:  1+ bilateral upper extremities Hypoactive lower extremities Cerebellar: No ataxia with FNF or H-S bilaterally  Gait: Deferred  Results for orders placed or performed during the hospital encounter of 06/15/20 (from the past 48 hour(s))  CBG monitoring, ED     Status: Abnormal   Collection Time: 06/15/20  5:00 PM  Result Value Ref Range   Glucose-Capillary 122 (H) 70 - 99 mg/dL    Comment: Glucose reference range applies only to samples taken after fasting for at least 8 hours.  Ethanol     Status: None   Collection Time: 06/15/20  7:29 PM  Result Value Ref Range   Alcohol, Ethyl (B) <10 <10 mg/dL    Comment:        LOWEST DETECTABLE LIMIT FOR SERUM ALCOHOL IS 10 mg/dL FOR MEDICAL PURPOSES ONLY (NOTE) Lowest detectable limit for serum alcohol is 10  mg/dL.  For medical purposes only. Performed at [facility], [address] [place], [name] [address]   Protime-INR     Status: None   Collection Time: 06/15/20  7:29 PM  Result Value Ref Range   Prothrombin Time 13.0 11.4 - 15.2 seconds   INR 1.0 0.8 - 1.2    Comment: (NOTE) INR goal varies based on device and disease states. Performed at [facility], [address] [place], [name] [address]   APTT     Status: None   Collection Time: 06/15/20  7:29 PM  Result Value Ref Range   aPTT 27 24 - 36 seconds    Comment: Performed at [facility], [address] [place], [name] [address]  CBC     Status: Abnormal   Collection Time: 06/15/20  7:29 PM  Result Value Ref Range   WBC 6.5 4.0 - 10.5 K/uL   RBC 4.17 3.87 - 5.11 MIL/uL   Hemoglobin 11.5 (L) 12.0 - 15.0 g/dL   HCT 35.7 (L) 36 - 46 %   MCV 85.6 80.0 - 100.0 fL   MCH 27.6 26.0 - 34.0 pg   MCHC 32.2 30.0 - 36.0 g/dL   RDW 13.9 11.5 - 15.5 %   Platelets 183 150 - 400 K/uL   nRBC 0.0 0.0 - 0.2 %    Comment: Performed at [facility], [address] [place], [name] [address]  Differential     Status: None   Collection Time: 06/15/20  7:29 PM  Result Value Ref Range   Neutrophils Relative % 47 %   Neutro Abs 3.1 1.7 - 7.7 K/uL   Lymphocytes Relative 45 %   Lymphs  Abs 2.9 0.7 - 4.0 K/uL   Monocytes Relative 7 %   Monocytes Absolute 0.4 0 - 1 K/uL   Eosinophils Relative 1 %   Eosinophils Absolute 0.0 0 - 0 K/uL   Basophils Relative 0 %   Basophils Absolute 0.0 0 - 0 K/uL   Immature Granulocytes 0 %   Abs Immature Granulocytes 0.01 0.00 - 0.07 K/uL    Comment: Performed at [facility], [address] [place], [name] [address]  Comprehensive metabolic panel     Status: Abnormal   Collection Time: 06/15/20  7:29 PM  Result Value Ref Range   Sodium 137 135 - 145 mmol/L   Potassium 4.1 3.5 - 5.1 mmol/L   Chloride 99 98 - 111 mmol/L   CO2 29 22 - 32 mmol/L    Glucose, Bld 105 (H) 70 - 99 mg/dL    Comment: Glucose reference range applies only to samples taken after fasting for at least 8 hours.   BUN 14 8 - 23 mg/dL   Creatinine, Ser 1.09 (H) 0.44 - 1.00 mg/dL   Calcium 10.4 (H) 8.9 - 10.3 mg/dL   Total Protein 7.5 6.5 - 8.1 g/dL   Albumin 4.2 3.5 - 5.0 g/dL   AST 24 15 - 41 U/L   ALT 23 0 - 44 U/L   Alkaline Phosphatase 71 38 - 126 U/L   Total Bilirubin 0.4 0.3 - 1.2 mg/dL   GFR calc non Af Amer 53 (L) >60 mL/min   GFR calc Af Amer >60 >60 mL/min   Anion gap 9 5 - 15    Comment: Performed at [facility], [address] [place], [name] [address]  SARS Coronavirus 2 by RT PCR (hospital order, performed in [facility] hospital lab) Nasopharyngeal Nasopharyngeal Swab     Status: None   Collection Time: 06/15/20  7:29 PM   Specimen: Nasopharyngeal Swab  Result Value Ref Range   SARS Coronavirus 2 NEGATIVE NEGATIVE    Comment: (NOTE) SARS-CoV-2 target nucleic acids are NOT DETECTED.  The SARS-CoV-2 RNA is generally detectable in upper and lower respiratory specimens during the acute phase of infection. The lowest concentration of SARS-CoV-2 viral copies this assay can detect is 250 copies / mL. A negative result does not preclude SARS-CoV-2 infection and should not be used as the sole basis for treatment or other patient management decisions.  A negative result may occur with improper specimen collection / handling, submission of specimen other than nasopharyngeal swab, presence of viral mutation(s) within the areas targeted by this assay, and inadequate number of viral copies (<250 copies / mL). A negative result must be combined with clinical observations, patient history, and epidemiological information.  Fact Sheet for Patients:   StrictlyIdeas.no  Fact Sheet for Healthcare Providers: BankingDealers.co.za  This test is not yet approved or  cleared by the [place]  FDA and has been authorized for detection and/or diagnosis of SARS-CoV-2 by FDA under an Emergency Use Authorization (EUA).  This EUA will remain in effect (meaning this test can be used) for the duration of the COVID-19 declaration under Section 564(b)(1) of the Act, 21 U.S.C. section 360bbb-3(b)(1), unless the authorization is terminated or revoked sooner.  Performed at [facility], [address] [place], [name] [address]    CT ANGIO HEAD W OR WO CONTRAST  Result Date: 06/16/2020 CLINICAL DATA:  Cerebellar infarct.  Headache and dizziness. EXAM: CT ANGIOGRAPHY HEAD AND NECK TECHNIQUE:  Multidetector CT imaging of the head and neck was performed using the standard protocol during bolus administration of intravenous contrast. Multiplanar CT image reconstructions and MIPs were obtained to evaluate the vascular anatomy. Carotid stenosis measurements (when applicable) are obtained utilizing NASCET criteria, using the distal internal carotid diameter as the denominator. CONTRAST:  79mL OMNIPAQUE IOHEXOL 350 MG/ML SOLN COMPARISON:  CT head without contrast 06/15/2020. MR head without contrast 06/16/2020. FINDINGS: CT HEAD FINDINGS Brain: Acute/subacute nonhemorrhagic infarct is again noted in the inferomedial left cerebellum and posteromedial right cerebellum. No new infarcts are present. No acute hemorrhage is present. Supratentorial structures are within normal limits. Ventricles are normal. No significant extraaxial fluid collection is present. Vascular: Atherosclerotic changes are present within the cavernous internal carotid arteries bilaterally without a hyperdense vessel. Skull: Calvarium is intact. No focal lytic or blastic lesions are present. No significant extracranial soft tissue lesion is present. Sinuses: The paranasal sinuses and mastoid air cells are clear. Orbits: The globes and orbits are within normal limits. Review of the MIP images confirms the above findings CTA NECK  FINDINGS Aortic arch: A 3 vessel arch configuration is present. Minimal atherosclerotic changes present without stenosis or aneurysm. Right carotid system: The right common carotid artery is within normal limits. Minimal irregularity is present at the bifurcation without significant stenosis. Subtle narrowing is present in the mid cervical right ICA without significant stenosis. Minimal vessel wall irregularity is present. Left carotid system: The left common carotid artery is within normal limits. The bifurcation is unremarkable. The proximal left ICA is normal. High-grade stenosis is present in the proximal left external carotid artery. There is some vessel wall irregularity in the mid cervical left ICA without significant stenosis. Vertebral arteries: The left vertebral artery is the dominant vessel. Scratched at the right vertebral artery is the dominant vessel, originating from the subclavian artery. High-grade stenosis present at the origin of the left vertebral artery with occlusion less than 1 cm from the origin. The vessel is reconstituted at the level of C6. A hypoplastic left vertebral artery demonstrates no other significant stenoses or occlusion. No significant stenosis is present in the cervical right vertebral artery. Skeleton: Degenerative changes of the cervical spine are most evident at C4-5, C5-6, and C6-7 with uncovertebral spurring and reversal of the normal cervical lordosis. No focal lytic or blastic lesions are present. Vertebral body heights are maintained. Other neck: The soft tissues of the neck are otherwise within normal limits. Upper chest: Lung apices are clear. Thoracic inlet is within normal limits. Review of the MIP images confirms the above findings CTA HEAD FINDINGS Anterior circulation: Minimal atherosclerotic changes are present within the cavernous internal carotid arteries bilaterally without stenosis. Internal carotid arteries are otherwise within normal limits through the  ICA termini. The A1 and M1 segments are normal. The anterior communicating artery is patent. MCA bifurcations are normal. ACA and MCA branch vessels are within normal limits. Posterior circulation: The right vertebral artery is the dominant vessel. PICA origins are visualized and normal. Hypoplastic V4 segment extends to the vertebrobasilar junction on the left. The basilar artery is normal. Both posterior cerebral arteries originate from the basilar tip. The PCA branch vessels are within normal limits. Venous sinuses: The dural sinuses are patent. The straight sinus and deep cerebral veins are patent. The right transverse sinus is dominant. Cortical veins are unremarkable. Anatomic variants: None Review of the MIP images confirms the above findings IMPRESSION: 1. High-grade stenosis at the origin of the left vertebral artery with occlusion  less than 1 cm from the origin. The vessel is reconstituted at the level of C6. 2. The right vertebral artery is dominant. 3. High-grade stenosis at the origin of the left external carotid artery. 4. Minimal atherosclerotic changes at the right carotid bifurcation and cavernous internal carotid arteries bilaterally without significant stenosis. 5. Minimal vessel wall irregularity in the mid cervical internal carotid arteries bilaterally without significant stenosis. This may represent fibromuscular dysplasia. 6. Multilevel spondylosis of the cervical spine. Electronically Signed   By: San Morelle M.D.   On: 06/16/2020 06:58   CT Head Wo Contrast  Result Date: 06/15/2020 CLINICAL DATA:  Dizziness EXAM: CT HEAD WITHOUT CONTRAST TECHNIQUE: Contiguous axial images were obtained from the base of the skull through the vertex without intravenous contrast. COMPARISON:  MRI 10/08/2019 FINDINGS: Brain: There is area of low-density noted in the inferior left cerebellar hemisphere compatible with acute infarct. Subtle low-density noted in the left temporal, parietal and  occipital lobes which I favor is artifactual, but given the acute infarct in the left cerebellum, cannot completely exclude infarct. No hemorrhage or hydrocephalus. Vascular: No hyperdense vessel or unexpected calcification. Skull: No acute calvarial abnormality. Sinuses/Orbits: Visualized paranasal sinuses and mastoids clear. Orbital soft tissues unremarkable. Other: None IMPRESSION: Area of low-density in the inferior left cerebellar hemisphere compatible with acute infarction. Subtle decreased attenuation throughout much of the left temporal, parietal and occipital lobes which I favor is artifactual. No hemorrhage. Electronically Signed   By: Rolm Baptise M.D.   On: 06/15/2020 18:35   CT ANGIO NECK W OR WO CONTRAST  Result Date: 06/16/2020 CLINICAL DATA:  Cerebellar infarct.  Headache and dizziness. EXAM: CT ANGIOGRAPHY HEAD AND NECK TECHNIQUE: Multidetector CT imaging of the head and neck was performed using the standard protocol during bolus administration of intravenous contrast. Multiplanar CT image reconstructions and MIPs were obtained to evaluate the vascular anatomy. Carotid stenosis measurements (when applicable) are obtained utilizing NASCET criteria, using the distal internal carotid diameter as the denominator. CONTRAST:  28mL OMNIPAQUE IOHEXOL 350 MG/ML SOLN COMPARISON:  CT head without contrast 06/15/2020. MR head without contrast 06/16/2020. FINDINGS: CT HEAD FINDINGS Brain: Acute/subacute nonhemorrhagic infarct is again noted in the inferomedial left cerebellum and posteromedial right cerebellum. No new infarcts are present. No acute hemorrhage is present. Supratentorial structures are within normal limits. Ventricles are normal. No significant extraaxial fluid collection is present. Vascular: Atherosclerotic changes are present within the cavernous internal carotid arteries bilaterally without a hyperdense vessel. Skull: Calvarium is intact. No focal lytic or blastic lesions are present. No  significant extracranial soft tissue lesion is present. Sinuses: The paranasal sinuses and mastoid air cells are clear. Orbits: The globes and orbits are within normal limits. Review of the MIP images confirms the above findings CTA NECK FINDINGS Aortic arch: A 3 vessel arch configuration is present. Minimal atherosclerotic changes present without stenosis or aneurysm. Right carotid system: The right common carotid artery is within normal limits. Minimal irregularity is present at the bifurcation without significant stenosis. Subtle narrowing is present in the mid cervical right ICA without significant stenosis. Minimal vessel wall irregularity is present. Left carotid system: The left common carotid artery is within normal limits. The bifurcation is unremarkable. The proximal left ICA is normal. High-grade stenosis is present in the proximal left external carotid artery. There is some vessel wall irregularity in the mid cervical left ICA without significant stenosis. Vertebral arteries: The left vertebral artery is the dominant vessel. Scratched at the right vertebral artery is the  dominant vessel, originating from the subclavian artery. High-grade stenosis present at the origin of the left vertebral artery with occlusion less than 1 cm from the origin. The vessel is reconstituted at the level of C6. A hypoplastic left vertebral artery demonstrates no other significant stenoses or occlusion. No significant stenosis is present in the cervical right vertebral artery. Skeleton: Degenerative changes of the cervical spine are most evident at C4-5, C5-6, and C6-7 with uncovertebral spurring and reversal of the normal cervical lordosis. No focal lytic or blastic lesions are present. Vertebral body heights are maintained. Other neck: The soft tissues of the neck are otherwise within normal limits. Upper chest: Lung apices are clear. Thoracic inlet is within normal limits. Review of the MIP images confirms the above  findings CTA HEAD FINDINGS Anterior circulation: Minimal atherosclerotic changes are present within the cavernous internal carotid arteries bilaterally without stenosis. Internal carotid arteries are otherwise within normal limits through the ICA termini. The A1 and M1 segments are normal. The anterior communicating artery is patent. MCA bifurcations are normal. ACA and MCA branch vessels are within normal limits. Posterior circulation: The right vertebral artery is the dominant vessel. PICA origins are visualized and normal. Hypoplastic V4 segment extends to the vertebrobasilar junction on the left. The basilar artery is normal. Both posterior cerebral arteries originate from the basilar tip. The PCA branch vessels are within normal limits. Venous sinuses: The dural sinuses are patent. The straight sinus and deep cerebral veins are patent. The right transverse sinus is dominant. Cortical veins are unremarkable. Anatomic variants: None Review of the MIP images confirms the above findings IMPRESSION: 1. High-grade stenosis at the origin of the left vertebral artery with occlusion less than 1 cm from the origin. The vessel is reconstituted at the level of C6. 2. The right vertebral artery is dominant. 3. High-grade stenosis at the origin of the left external carotid artery. 4. Minimal atherosclerotic changes at the right carotid bifurcation and cavernous internal carotid arteries bilaterally without significant stenosis. 5. Minimal vessel wall irregularity in the mid cervical internal carotid arteries bilaterally without significant stenosis. This may represent fibromuscular dysplasia. 6. Multilevel spondylosis of the cervical spine. Electronically Signed   By: San Morelle M.D.   On: 06/16/2020 06:58   MR BRAIN WO CONTRAST  Result Date: 06/16/2020 CLINICAL DATA:  Episode of dizziness and mild-to-moderate headache 2 days ago. Nausea. EXAM: MRI HEAD WITHOUT CONTRAST TECHNIQUE: Multiplanar, multiecho pulse  sequences of the brain and surrounding structures were obtained without intravenous contrast. COMPARISON:  MR head without contrast scratched at CT head without contrast 06/15/2020. MR head without contrast 10/08/2019 FINDINGS: Brain: Acute/subacute nonhemorrhagic infarct is confirmed in the inferomedial left cerebellum. Additional posterior cortical infarct is present in the medial right cerebellum. Infarct involves the inferior vermis scattered more superior posterior infarcts. No acute or subacute supratentorial infarct is present. T2 signal changes are associated with the areas of acute/subacute infarct. No significant supratentorial white matter disease is present. The ventricles are of normal size. No significant extraaxial fluid collection is present. Vascular: Prominent vein extends into the superior vermis, likely DVA. Flow is present in the major intracranial arteries. No other venous abnormalities are present. Skull and upper cervical spine: Mild multilevel degenerative changes are present in the upper cervical spine, most prominent at C3-4. Marrow signal is normal. Vertebral body heights are normal. Craniocervical junction is normal. Midline structures are unremarkable. Sinuses/Orbits: The paranasal sinuses and mastoid air cells are clear. The globes and orbits are within normal limits.  IMPRESSION: 1. Acute/subacute nonhemorrhagic infarct involving the inferomedial left cerebellum. 2. Additional acute/subacute nonhemorrhagic posterior cortical infarct in the medial right cerebellum. 3. No acute or subacute supratentorial infarct. Electronically Signed   By: San Morelle M.D.   On: 06/16/2020 06:44    Assessment: 64 y.o. female with acute cerebellar ischemic infarctions, left worse than right 1. Exam reveals no focal weakness, dysarthria or ataxia.  2. CT head: Area of low-density in the inferior left cerebellar hemisphere compatible with acute infarction. 3. CTA of head and neck: High-grade  stenosis at the origin of the left vertebral artery with occlusion less than 1 cm from the origin. The vessel is reconstituted at the level of C6. The right vertebral artery is dominant. High-grade stenosis at the origin of the left external carotid artery. Minimal atherosclerotic changes at the right carotid bifurcation and cavernous internal carotid arteries bilaterally without significant stenosis. Minimal vessel wall irregularity in the mid cervical internal carotid arteries bilaterally without significant stenosis. This may represent fibromuscular dysplasia. 4. MRI brain: Acute/subacute nonhemorrhagic infarct involving the inferomedial left cerebellum. Additional acute/subacute nonhemorrhagic posterior cortical infarct in the medial right cerebellum. No acute or subacute supratentorial infarct. 5. Stroke Risk Factors - Cancer history, HLD and HTN 6. Not classifiable as having failed ASA as she stopped this about one week ago for a procedure.   Recommendations: 1. HgbA1c, fasting lipid panel 2. TTE 3. PT consult, OT consult, Speech consult 4. Restarted ASA. She stopped this for a procedure recently.  5. BP management. Out of permissive HTN time window.  6. Start atorvastatin 40 mg po qd. Obtain baseline CK level 7. Risk factor modification 8. Telemetry monitoring 9. Frequent neuro checks 10. Repeat CT head in 24 hours to assess for possible interval swelling of the left sided cerebellar infarction.    @Electronically  signed: Dr. Kerney Elbe  06/16/2020, 7:21 AM

## 2020-06-16 NOTE — Evaluation (Addendum)
Speech Language Pathology Evaluation Patient Details Name: Kristi [name] MRN: [id number] DOB: Mar 27, 1956 Today's Date: 06/16/2020 Time: 1038-1100 SLP Time Calculation (min) (ACUTE ONLY): 22 min  Problem List:  Patient Active Problem List   Diagnosis Date Noted  . Acute CVA (cerebrovascular accident) ([place]) 06/15/2020  . Debility 01/18/2020  . Light headed 01/18/2020  . Mild cognitive impairment 08/24/2019  . Genetic testing 02/20/2019  . S/P mastectomy, right 12/26/2018  . Left upper extremity numbness 06/19/2018  . Cervical cancer screening 06/18/2018  . Anemia 05/14/2017  . Hyperlipidemia 05/14/2017  . History of left breast cancer 02/15/2017  . Nodule of finger of left hand 11/14/2016  . Right hip pain 11/14/2016  . Knee pain, bilateral 11/14/2016  . Constipation 11/13/2016  . Preventative health care 11/13/2016  . HTN (hypertension) 11/13/2016  . Hyperglycemia 01/09/2016  . Neuropathy of hand, left 09/21/2015  . History of chicken pox   . Shingles 05/15/2014  . Anxiety and depression 05/15/2014  . Rotator cuff tear 02/24/2014  . Left shoulder pain with abduction 12/12/2013  . CVA (cerebral infarction) 07/18/2013  . TIA (transient ischemic attack) 07/18/2013  . Contracture of axilla 05/20/2013  . Primary cancer of upper inner quadrant of left female breast ([place]) 01/24/2013  . Anal fissure 05/03/2011  . Hemorrhoids, internal, with prolapse 05/03/2011   Past Medical History:  Past Medical History:  Diagnosis Date  . Anemia    Iron deficinecy anemia  . Anemia 05/14/2017  . Anxiety   . Anxiety and depression 05/15/2014  . Arthritis   . Breast cancer ([place])    left ,last radiation 2'15, last chemo 8'14  . Constipation 11/13/2016  . Depression   . History of chicken pox   . History of radiation therapy 09/09/13-10/28/13   45 gray to left breast, lumpectomy cavity boosted to 63 gray  . HTN (hypertension) 11/13/2016  . Hyperglycemia 01/09/2016  . Hyperlipidemia 05/14/2017  .  Hypertension   . MRSA (methicillin resistant Staphylococcus aureus) 2009   right groin area-no issues now. 04-07-14 PCR screen negative today.  . Neuropathy   . Preventative health care 11/13/2016   Past Surgical History:  Past Surgical History:  Procedure Laterality Date  . ANAL SPHINCTEROTOMY  04/2011  . APPENDECTOMY  1980  . AXILLARY LYMPH NODE DISSECTION Left 02/04/2013   Procedure: LEFT AXILLARY LYMPH NODE DISSECTION;  Surgeon: Stark Klein, MD;  Location: [place];  Service: General;  Laterality: Left;  End: 4944  . BREAST LUMPECTOMY WITH NEEDLE LOCALIZATION Left 02/04/2013   Procedure: LEFT BREAST LUMPECTOMY WITH NEEDLE LOCALIZATION;  Surgeon: Stark Klein, MD;  Location: [place];  Service: General;  Laterality: Left;  . BREAST SURGERY     Lumpectomy in april 2014  . HEMORRHOID SURGERY  04/2011   ligation  . MASTECTOMY Left 02/15/2017  . PORT-A-CATH REMOVAL N/A 04/16/2014   Procedure: REMOVAL PORT-A-CATH;  Surgeon: Stark Klein, MD;  Location: [facility];  Service: General;  Laterality: N/A;  . PORTACATH PLACEMENT Right 02/04/2013   Procedure: INSERTION PORT-A-CATH;  Surgeon: Stark Klein, MD;  Location: [facility];  Service: General;  Laterality: Right;  Start Time: 9675.  Marland Kitchen SHOULDER ARTHROSCOPY WITH ROTATOR CUFF REPAIR AND SUBACROMIAL DECOMPRESSION Left 02/24/2014   Procedure: SHOULDER ARTHROSCOPY WITH ROTATOR CUFF REPAIR AND SUBACROMIAL DECOMPRESSION;  Surgeon: Meredith Pel, MD;  Location: [facility];  Service: Orthopedics;  Laterality: Left;  LEFT SHOULDER DIAGNOSTIC OPERATIVE ARTHROSCOPY, SUBACROMIAL DECOMPRESSION, ROTATOR CUFF TEAR REPAIR  . SIMPLE MASTECTOMY WITH AXILLARY SENTINEL NODE BIOPSY Left 02/15/2017  Procedure: LEFT MASTECTOMY;  Surgeon: Stark Klein, MD;  Location: [facility];  Service: General;  Laterality: Left;  . TOTAL MASTECTOMY Right 12/26/2018   Procedure: RIGHT BREAST PROPHYLATIC MASTECTOMY;  Surgeon: Stark Klein, MD;  Location: [place];  Service: General;  Laterality: Right;   HPI:   Kristi [name] is a 64 y.o. female with history of breast cancer in remission, hypertension, anemia, neuropathy and depression presents to the ER at [facility] with complaints of dizziness. CT head was showing left cerebellar infarct.   Assessment / Plan / Recommendation Clinical Impression  Pt demonstrates a mild cognitive impairment in setting of acute-subacute cerebellar CVA. Pt initially denied any changes in cognition, but after testing, felt she may be "off." She was assessed with the [facility] Mental Status examination with a total score of 24/30, consistent with mild neurocognitive disorder. Deficits were noted in the areas of delayed recall and functional calculations. Pt was given multiple attempts at the calculations and even was provided setup by SLP, but was inaccurate. She was able to improve her delayed recall with cues from 2/5 to 4/5. She agrees this is not normal for her, but she does report being quite tired right now. SLP discussed goals of wanting her to be able to handle her finances and other functional tasks, like grocery shopping, etc that require calculations. She demonstrated understanding and agreement, however, husband does handle finances. Recommend ST 1x/wk during acute stay for memory and complex problem solving therapy. Should pt discharge prior to resolution of deficits, recommend outpatient ST for further evaluation and treatment.   [facility] Mental Status Examination Orientation: 3/3 Coding: 2 trial Calculations: 0/3 Naming: 3/3 Recall: 2/5 Attention: 2/2 Visual Processing: 6/6 Paragraph Memory: 8/8 Total: 24/30; mild cognitive impairment; with 27+ being WNL    SLP Assessment  SLP Recommendation/Assessment: Patient needs continued Speech Lanaguage Pathology Services SLP Visit Diagnosis: Cognitive communication deficit (R41.841)    Follow Up Recommendations  Outpatient SLP    Frequency and Duration min 1 x/week  1  week      SLP Evaluation Cognition  Overall Cognitive Status: Within Functional Limits for tasks assessed Arousal/Alertness: Lethargic Orientation Level: Oriented X4 Attention: Focused Focused Attention: Appears intact Memory: Impaired Memory Impairment: Storage deficit;Retrieval deficit;Decreased short term memory Awareness: Impaired Awareness Impairment: Intellectual impairment Problem Solving: Impaired Problem Solving Impairment: Verbal complex Executive Function: Writer: Impaired Organizing Impairment: Verbal complex Safety/Judgment: Appears intact       Comprehension  Auditory Comprehension Overall Auditory Comprehension: Appears within functional limits for tasks assessed    Expression Expression Primary Mode of Expression: Verbal Verbal Expression Overall Verbal Expression: Appears within functional limits for tasks assessed Written Expression Dominant Hand: Right   Oral / Motor  Oral Motor/Sensory Function Overall Oral Motor/Sensory Function: Within functional limits Motor Speech Overall Motor Speech: Appears within functional limits for tasks assessed Respiration: Within functional limits Phonation: Normal Resonance: Within functional limits Articulation: Within functional limitis Intelligibility: Intelligible Motor Planning: Witnin functional limits                      Raistlin Gum P. Nicoletta Hush, M.S., [place] Pathologist Acute Rehabilitation Services Pager: [place] 06/16/2020, 11:00 AM

## 2020-06-16 NOTE — Progress Notes (Signed)
Rehab Admissions Coordinator Note:  Patient was screened by Cleatrice Burke for appropriateness for an Inpatient Acute Rehab Consult per therapy recs. .  At this time, we are recommending Inpatient Rehab consult. I will place order per protocol.  Cleatrice Burke RN MSN 06/16/2020, 1:08 PM  I can be reached at [phone].

## 2020-06-16 NOTE — Progress Notes (Signed)
IP rehab admissions:  Please see consult done today by Dr. Posey Pronto.  I will follow up with patient tomorrow.  Call me for questions.  [phone]

## 2020-06-16 NOTE — Consult Note (Signed)
Physical Medicine and Rehabilitation Consult Reason for Consult: Dizziness with decreased functional mobility Referring Physician: [facility]   HPI: Kristi [name] is a 64 y.o. right-handed female with history of anxiety, left breast cancer with radiation chemotherapy, hyperlipidemia, hypertension.  History taken from chart review and patient. Patient lives with spouse.  Independent driving prior to admission.  Two-level home bed and bath on main level with 2 steps to entry.  She presented on 06/15/2020 with dizziness, gait abnormality, vertigo with nausea and mild to moderate headache.  Cranial CT scan showed area of low density in the inferior cerebellar hemisphere compatible with acute infarction.  No hemorrhage.  MRI identified acute subacute nonhemorrhagic infarct involving the inferomedial left cerebellum additional acute subacute nonhemorrhagic posterior cortical infarct in the medial right cerebellum.  Patient did not receive TPA.  CT angiogram of head and neck high-grade stenosis at the origin of the left vertebral artery with occlusion less than 1 cm from the origin.  The vessel is reconstituted at the level of C6.  High-grade stenosis of the origin of the left external carotid artery.  Echocardiogram pending.  Admission chemistries alcohol negative, glucose 105, creatinine 1.09, SARS coronavirus negative, hemoglobin 10.6.  Currently maintained on aspirin and Plavix for CVA prophylaxis.  Subcutaneous Lovenox for DVT prophylaxis.  Therapy evaluation completed with recommendations of physical medicine rehab consult.   Review of Systems  Constitutional: Negative for chills and fever.  HENT: Negative for hearing loss.   Eyes: Negative for blurred vision and double vision.  Respiratory: Negative for cough and shortness of breath.   Cardiovascular: Negative for chest pain, palpitations and leg swelling.  Gastrointestinal: Positive for constipation. Negative for heartburn, nausea and vomiting.   Genitourinary: Negative for dysuria, flank pain and hematuria.  Musculoskeletal: Positive for myalgias.  Skin: Negative for rash.  Neurological: Positive for dizziness and headaches. Negative for sensory change, speech change, focal weakness and weakness.  Psychiatric/Behavioral: Positive for depression. The patient has insomnia.        Anxiety  All other systems reviewed and are negative.  Past Medical History:  Diagnosis Date   Anemia    Iron deficinecy anemia   Anemia 05/14/2017   Anxiety    Anxiety and depression 05/15/2014   Arthritis    Breast cancer ([place])    left ,last radiation 2'15, last chemo 8'14   Constipation 11/13/2016   Depression    History of chicken pox    History of radiation therapy 09/09/13-10/28/13   45 gray to left breast, lumpectomy cavity boosted to 63 gray   HTN (hypertension) 11/13/2016   Hyperglycemia 01/09/2016   Hyperlipidemia 05/14/2017   Hypertension    MRSA (methicillin resistant Staphylococcus aureus) 2009   right groin area-no issues now. 04-07-14 PCR screen negative today.   Neuropathy    Preventative health care 11/13/2016   Past Surgical History:  Procedure Laterality Date   ANAL SPHINCTEROTOMY  04/2011   APPENDECTOMY  1980   AXILLARY LYMPH NODE DISSECTION Left 02/04/2013   Procedure: LEFT AXILLARY LYMPH NODE DISSECTION;  Surgeon: Stark Klein, MD;  Location: [facility];  Service: General;  Laterality: Left;  End: 2595   BREAST LUMPECTOMY WITH NEEDLE LOCALIZATION Left 02/04/2013   Procedure: LEFT BREAST LUMPECTOMY WITH NEEDLE LOCALIZATION;  Surgeon: Stark Klein, MD;  Location: [facility];  Service: General;  Laterality: Left;   BREAST SURGERY     Lumpectomy in april 2014   [place]  04/2011   ligation   MASTECTOMY Left  02/15/2017   PORT-A-CATH REMOVAL N/A 04/16/2014   Procedure: REMOVAL PORT-A-CATH;  Surgeon: Stark Klein, MD;  Location: [facility];  Service: General;  Laterality: N/A;   PORTACATH PLACEMENT Right 02/04/2013    Procedure: INSERTION PORT-A-CATH;  Surgeon: Stark Klein, MD;  Location: [facility];  Service: General;  Laterality: Right;  Start Time: 7616.   SHOULDER ARTHROSCOPY WITH ROTATOR CUFF REPAIR AND SUBACROMIAL DECOMPRESSION Left 02/24/2014   Procedure: SHOULDER ARTHROSCOPY WITH ROTATOR CUFF REPAIR AND SUBACROMIAL DECOMPRESSION;  Surgeon: Meredith Pel, MD;  Location: [facility];  Service: Orthopedics;  Laterality: Left;  LEFT SHOULDER DIAGNOSTIC OPERATIVE ARTHROSCOPY, SUBACROMIAL DECOMPRESSION, ROTATOR CUFF TEAR REPAIR   SIMPLE MASTECTOMY WITH AXILLARY SENTINEL NODE BIOPSY Left 02/15/2017   Procedure: LEFT MASTECTOMY;  Surgeon: Stark Klein, MD;  Location: [facility];  Service: General;  Laterality: Left;   TOTAL MASTECTOMY Right 12/26/2018   Procedure: RIGHT BREAST PROPHYLATIC MASTECTOMY;  Surgeon: Stark Klein, MD;  Location: [facility];  Service: General;  Laterality: Right;   Family History  Problem Relation Age of Onset   Lung cancer Father    Hypertension Father    Thyroid cancer Father        dx in his 39s   Cancer Father        lung, thyroid, smoker   Breast cancer Paternal Aunt 2   Colon cancer Paternal Aunt        dx in her 50x   Cervical cancer Paternal Aunt        dzx in her 41s   Ovarian cancer Cousin        dx in her lage 36s   Breast cancer Cousin        maternal first cousin, once removed; dx in her late 86s   Breast cancer Cousin        maternal first cousin once removed; dx in late 30s   Hypertension Mother    Diabetes Mother    Dementia Mother    Memory loss Mother    Hypertension Brother    Seizures Brother        Alcohol induced.   Alcohol abuse Brother        drinker, smoker   Cancer Paternal Uncle        oral cancer   Kidney cancer Paternal Grandmother    Arthritis Daughter        back surgery   Cancer Cousin        several paternal cousins with brain cancer, leukemia, and other cancers   Cancer Sister        stomach   Social History:   reports that she has never smoked. She has never used smokeless tobacco. She reports that she does not drink alcohol and does not use drugs. Allergies: No Known Allergies Medications Prior to Admission  Medication Sig Dispense Refill   anastrozole (ARIMIDEX) 1 MG tablet Take 1 tablet (1 mg total) by mouth daily. TAKE 1 TABLET (1 MG) BY MOUTH DAILY IN THE MORNING (Patient taking differently: Take 1 mg by mouth daily. ) 90 tablet 3   Apoaequorin (PREVAGEN) 10 MG CAPS Take 1 capsule by mouth daily.     aspirin EC 81 MG tablet Take 1 tablet (81 mg total) by mouth daily.     gabapentin (NEURONTIN) 800 MG tablet Take 1 tablet (800 mg total) by mouth 3 (three) times daily. 90 tablet 3   HYDROcodone-acetaminophen (NORCO) 10-325 MG tablet Take 1 tablet by mouth every 6 (six) hours as needed. (Patient  taking differently: Take 1 tablet by mouth every 6 (six) hours as needed for moderate pain or severe pain. ) 180 tablet 0   hydrOXYzine (ATARAX/VISTARIL) 50 MG tablet Take 50 mg by mouth 2 (two) times daily.     LATUDA 80 MG TABS tablet Take 0.5 tablets (40 mg total) by mouth at bedtime. 30 tablet 1   Magnesium 250 MG TABS Take 1 tablet by mouth daily.     Multiple Vitamin (MULTIVITAMIN WITH MINERALS) TABS tablet Take 1 tablet by mouth daily.     traZODone (DESYREL) 50 MG tablet Take 50 mg by mouth at bedtime.      valsartan-hydrochlorothiazide (DIOVAN-HCT) 320-25 MG tablet TAKE 1 TABLET BY MOUTH EVERY DAY IN THE MORNING (Patient taking differently: Take 1 tablet by mouth daily. ) 90 tablet 1   KLOR-CON M20 20 MEQ tablet TAKE 1 TABLET BY MOUTH EVERY DAY (Patient not taking: Reported on 06/16/2020) 90 tablet 1    Home: Home Living Family/patient expects to be discharged to:: Private residence Living Arrangements: Spouse/significant other Available Help at Discharge: Family Type of Home: House Home Access: Stairs to enter Technical brewer of Steps: 2 Home Layout: Two level, Bed/bath  upstairs, Able to live on main level with bedroom/bathroom Alternate Level Stairs-Number of Steps: flight  Bathroom Shower/Tub: Multimedia programmer: Standard Home Equipment: Radio producer - single point Additional Comments: shower is on 2nd floor   Lives With: Spouse  Functional History: Prior Function Level of Independence: Independent Comments: independent and driving  Functional Status:  Mobility: Bed Mobility Overal bed mobility: Needs Assistance Bed Mobility: Supine to Sit Supine to sit: Supervision, HOB elevated Sit to supine: Supervision, HOB elevated General bed mobility comments: supervision for safety  Transfers Overall transfer level: Needs assistance Transfers: Sit to/from Stand Sit to Stand: Min assist General transfer comment: initally unable to transition without loosing balance, constant cueing for focused gaze with min assist to power up and steady Ambulation/Gait Ambulation/Gait assistance: Min assist, Mod assist Gait Distance (Feet): 16 Feet Assistive device: 2 person hand held assist Gait Pattern/deviations: Step-through pattern, Ataxic, Scissoring, Staggering left, Staggering right, Narrow base of support General Gait Details: very unsteady with gait, frequent scissoring and increased leaning to the left, poor proprioception noted as she had reduced awareness of gait pattern and how to correct. RPE 8/10. Gait velocity: decreased    ADL: ADL Overall ADL's : Needs assistance/impaired Grooming: Min guard, Sitting Upper Body Bathing: Min guard, Sitting Lower Body Bathing: Sit to/from stand, Minimal assistance Upper Body Dressing : Min guard, Sitting Lower Body Dressing: Moderate assistance, Sit to/from stand Toilet Transfer: Minimal assistance, +2 for safety/equipment, Ambulation Functional mobility during ADLs: Minimal assistance, Moderate assistance, +2 for physical assistance, +2 for safety/equipment General ADL Comments: patient limited by dizziness,  impaired balance    Cognition: Cognition Overall Cognitive Status: Impaired/Different from baseline Arousal/Alertness: Lethargic Orientation Level: Oriented X4 Attention: Focused Focused Attention: Appears intact Memory: Impaired Memory Impairment: Storage deficit, Retrieval deficit, Decreased short term memory Awareness: Impaired Awareness Impairment: Intellectual impairment Problem Solving: Impaired Problem Solving Impairment: Verbal complex Executive Function: Writer: Impaired Organizing Impairment: Verbal complex Safety/Judgment: Appears intact Cognition Arousal/Alertness: Awake/alert Behavior During Therapy: Flat affect Overall Cognitive Status: Impaired/Different from baseline Area of Impairment: Awareness, Problem solving Awareness: Emergent Problem Solving: Slow processing, Decreased initiation, Difficulty sequencing, Requires verbal cues General Comments: pt with flat affect, noted slow processing and poor problem solving   Blood pressure 109/74, pulse 74, temperature 98.7 F (37.1 C), temperature source Oral,  resp. rate 17, height 5\' 2"  (1.575 m), weight 72.2 kg, last menstrual period 01/20/2013, SpO2 99 %. Physical Exam Vitals reviewed.  Constitutional:      General: She is not in acute distress.    Appearance: She is not ill-appearing.  HENT:     Head: Normocephalic and atraumatic.     Right Ear: External ear normal.     Left Ear: External ear normal.     Nose: Nose normal.  Eyes:     General:        Right eye: No discharge.        Left eye: No discharge.     Extraocular Movements: Extraocular movements intact.  Cardiovascular:     Rate and Rhythm: Normal rate.  Pulmonary:     Effort: Pulmonary effort is normal. No respiratory distress.     Breath sounds: No stridor.  Abdominal:     General: Abdomen is flat. Bowel sounds are normal. There is no distension.  Musculoskeletal:     Comments: No edema or tenderness in extremities  Skin:     General: Skin is warm and dry.  Neurological:     Mental Status: She is alert.     Comments: Alert and oriented Fair awareness of deficits. Motor: Grossly 4+/5 throughout No ataxia bilateral lower extremities  Psychiatric:        Mood and Affect: Mood normal.        Behavior: Behavior normal.        Thought Content: Thought content normal.     Results for orders placed or performed during the hospital encounter of 06/15/20 (from the past 24 hour(s))  CBG monitoring, ED     Status: Abnormal   Collection Time: 06/15/20  5:00 PM  Result Value Ref Range   Glucose-Capillary 122 (H) 70 - 99 mg/dL  Ethanol     Status: None   Collection Time: 06/15/20  7:29 PM  Result Value Ref Range   Alcohol, Ethyl (B) <10 <10 mg/dL  Protime-INR     Status: None   Collection Time: 06/15/20  7:29 PM  Result Value Ref Range   Prothrombin Time 13.0 11.4 - 15.2 seconds   INR 1.0 0.8 - 1.2  APTT     Status: None   Collection Time: 06/15/20  7:29 PM  Result Value Ref Range   aPTT 27 24 - 36 seconds  CBC     Status: Abnormal   Collection Time: 06/15/20  7:29 PM  Result Value Ref Range   WBC 6.5 4.0 - 10.5 K/uL   RBC 4.17 3.87 - 5.11 MIL/uL   Hemoglobin 11.5 (L) 12.0 - 15.0 g/dL   HCT 35.7 (L) 36 - 46 %   MCV 85.6 80.0 - 100.0 fL   MCH 27.6 26.0 - 34.0 pg   MCHC 32.2 30.0 - 36.0 g/dL   RDW 13.9 11.5 - 15.5 %   Platelets 183 150 - 400 K/uL   nRBC 0.0 0.0 - 0.2 %  Differential     Status: None   Collection Time: 06/15/20  7:29 PM  Result Value Ref Range   Neutrophils Relative % 47 %   Neutro Abs 3.1 1.7 - 7.7 K/uL   Lymphocytes Relative 45 %   Lymphs Abs 2.9 0.7 - 4.0 K/uL   Monocytes Relative 7 %   Monocytes Absolute 0.4 0 - 1 K/uL   Eosinophils Relative 1 %   Eosinophils Absolute 0.0 0 - 0 K/uL   Basophils Relative 0 %  Basophils Absolute 0.0 0 - 0 K/uL   Immature Granulocytes 0 %   Abs Immature Granulocytes 0.01 0.00 - 0.07 K/uL  Comprehensive metabolic panel     Status: Abnormal    Collection Time: 06/15/20  7:29 PM  Result Value Ref Range   Sodium 137 135 - 145 mmol/L   Potassium 4.1 3.5 - 5.1 mmol/L   Chloride 99 98 - 111 mmol/L   CO2 29 22 - 32 mmol/L   Glucose, Bld 105 (H) 70 - 99 mg/dL   BUN 14 8 - 23 mg/dL   Creatinine, Ser 1.09 (H) 0.44 - 1.00 mg/dL   Calcium 10.4 (H) 8.9 - 10.3 mg/dL   Total Protein 7.5 6.5 - 8.1 g/dL   Albumin 4.2 3.5 - 5.0 g/dL   AST 24 15 - 41 U/L   ALT 23 0 - 44 U/L   Alkaline Phosphatase 71 38 - 126 U/L   Total Bilirubin 0.4 0.3 - 1.2 mg/dL   GFR calc non Af Amer 53 (L) >60 mL/min   GFR calc Af Amer >60 >60 mL/min   Anion gap 9 5 - 15  SARS Coronavirus 2 by RT PCR (hospital order, performed in [facility] hospital lab) Nasopharyngeal Nasopharyngeal Swab     Status: None   Collection Time: 06/15/20  7:29 PM   Specimen: Nasopharyngeal Swab  Result Value Ref Range   SARS Coronavirus 2 NEGATIVE NEGATIVE  HIV Antibody (routine testing w rflx)     Status: None   Collection Time: 06/16/20  8:01 AM  Result Value Ref Range   HIV Screen 4th Generation wRfx Non Reactive Non Reactive  Hemoglobin A1c     Status: Abnormal   Collection Time: 06/16/20  8:01 AM  Result Value Ref Range   Hgb A1c MFr Bld 5.9 (H) 4.8 - 5.6 %   Mean Plasma Glucose 122.63 mg/dL  CBC     Status: Abnormal   Collection Time: 06/16/20  8:01 AM  Result Value Ref Range   WBC 6.1 4.0 - 10.5 K/uL   RBC 3.86 (L) 3.87 - 5.11 MIL/uL   Hemoglobin 10.6 (L) 12.0 - 15.0 g/dL   HCT 33.0 (L) 36 - 46 %   MCV 85.5 80.0 - 100.0 fL   MCH 27.5 26.0 - 34.0 pg   MCHC 32.1 30.0 - 36.0 g/dL   RDW 14.1 11.5 - 15.5 %   Platelets 161 150 - 400 K/uL   nRBC 0.0 0.0 - 0.2 %  Creatinine, serum     Status: Abnormal   Collection Time: 06/16/20  8:01 AM  Result Value Ref Range   Creatinine, Ser 1.19 (H) 0.44 - 1.00 mg/dL   GFR calc non Af Amer 48 (L) >60 mL/min   GFR calc Af Amer 55 (L) >60 mL/min  Lipid panel     Status: None   Collection Time: 06/16/20  8:01 AM  Result Value Ref  Range   Cholesterol 144 0 - 200 mg/dL   Triglycerides 68 <150 mg/dL   HDL 41 >40 mg/dL   Total CHOL/HDL Ratio 3.5 RATIO   VLDL 14 0 - 40 mg/dL   LDL Cholesterol 89 0 - 99 mg/dL   CT ANGIO HEAD W OR WO CONTRAST  Result Date: 06/16/2020 CLINICAL DATA:  Cerebellar infarct.  Headache and dizziness. EXAM: CT ANGIOGRAPHY HEAD AND NECK TECHNIQUE: Multidetector CT imaging of the head and neck was performed using the standard protocol during bolus administration of intravenous contrast. Multiplanar CT image reconstructions and  MIPs were obtained to evaluate the vascular anatomy. Carotid stenosis measurements (when applicable) are obtained utilizing NASCET criteria, using the distal internal carotid diameter as the denominator. CONTRAST:  51mL OMNIPAQUE IOHEXOL 350 MG/ML SOLN COMPARISON:  CT head without contrast 06/15/2020. MR head without contrast 06/16/2020. FINDINGS: CT HEAD FINDINGS Brain: Acute/subacute nonhemorrhagic infarct is again noted in the inferomedial left cerebellum and posteromedial right cerebellum. No new infarcts are present. No acute hemorrhage is present. Supratentorial structures are within normal limits. Ventricles are normal. No significant extraaxial fluid collection is present. Vascular: Atherosclerotic changes are present within the cavernous internal carotid arteries bilaterally without a hyperdense vessel. Skull: Calvarium is intact. No focal lytic or blastic lesions are present. No significant extracranial soft tissue lesion is present. Sinuses: The paranasal sinuses and mastoid air cells are clear. Orbits: The globes and orbits are within normal limits. Review of the MIP images confirms the above findings CTA NECK FINDINGS Aortic arch: A 3 vessel arch configuration is present. Minimal atherosclerotic changes present without stenosis or aneurysm. Right carotid system: The right common carotid artery is within normal limits. Minimal irregularity is present at the bifurcation without  significant stenosis. Subtle narrowing is present in the mid cervical right ICA without significant stenosis. Minimal vessel wall irregularity is present. Left carotid system: The left common carotid artery is within normal limits. The bifurcation is unremarkable. The proximal left ICA is normal. High-grade stenosis is present in the proximal left external carotid artery. There is some vessel wall irregularity in the mid cervical left ICA without significant stenosis. Vertebral arteries: The left vertebral artery is the dominant vessel. Scratched at the right vertebral artery is the dominant vessel, originating from the subclavian artery. High-grade stenosis present at the origin of the left vertebral artery with occlusion less than 1 cm from the origin. The vessel is reconstituted at the level of C6. A hypoplastic left vertebral artery demonstrates no other significant stenoses or occlusion. No significant stenosis is present in the cervical right vertebral artery. Skeleton: Degenerative changes of the cervical spine are most evident at C4-5, C5-6, and C6-7 with uncovertebral spurring and reversal of the normal cervical lordosis. No focal lytic or blastic lesions are present. Vertebral body heights are maintained. Other neck: The soft tissues of the neck are otherwise within normal limits. Upper chest: Lung apices are clear. Thoracic inlet is within normal limits. Review of the MIP images confirms the above findings CTA HEAD FINDINGS Anterior circulation: Minimal atherosclerotic changes are present within the cavernous internal carotid arteries bilaterally without stenosis. Internal carotid arteries are otherwise within normal limits through the ICA termini. The A1 and M1 segments are normal. The anterior communicating artery is patent. MCA bifurcations are normal. ACA and MCA branch vessels are within normal limits. Posterior circulation: The right vertebral artery is the dominant vessel. PICA origins are  visualized and normal. Hypoplastic V4 segment extends to the vertebrobasilar junction on the left. The basilar artery is normal. Both posterior cerebral arteries originate from the basilar tip. The PCA branch vessels are within normal limits. Venous sinuses: The dural sinuses are patent. The straight sinus and deep cerebral veins are patent. The right transverse sinus is dominant. Cortical veins are unremarkable. Anatomic variants: None Review of the MIP images confirms the above findings IMPRESSION: 1. High-grade stenosis at the origin of the left vertebral artery with occlusion less than 1 cm from the origin. The vessel is reconstituted at the level of C6. 2. The right vertebral artery is dominant. 3. High-grade  stenosis at the origin of the left external carotid artery. 4. Minimal atherosclerotic changes at the right carotid bifurcation and cavernous internal carotid arteries bilaterally without significant stenosis. 5. Minimal vessel wall irregularity in the mid cervical internal carotid arteries bilaterally without significant stenosis. This may represent fibromuscular dysplasia. 6. Multilevel spondylosis of the cervical spine. Electronically Signed   By: San Morelle M.D.   On: 06/16/2020 06:58   CT Head Wo Contrast  Result Date: 06/15/2020 CLINICAL DATA:  Dizziness EXAM: CT HEAD WITHOUT CONTRAST TECHNIQUE: Contiguous axial images were obtained from the base of the skull through the vertex without intravenous contrast. COMPARISON:  MRI 10/08/2019 FINDINGS: Brain: There is area of low-density noted in the inferior left cerebellar hemisphere compatible with acute infarct. Subtle low-density noted in the left temporal, parietal and occipital lobes which I favor is artifactual, but given the acute infarct in the left cerebellum, cannot completely exclude infarct. No hemorrhage or hydrocephalus. Vascular: No hyperdense vessel or unexpected calcification. Skull: No acute calvarial abnormality.  Sinuses/Orbits: Visualized paranasal sinuses and mastoids clear. Orbital soft tissues unremarkable. Other: None IMPRESSION: Area of low-density in the inferior left cerebellar hemisphere compatible with acute infarction. Subtle decreased attenuation throughout much of the left temporal, parietal and occipital lobes which I favor is artifactual. No hemorrhage. Electronically Signed   By: Rolm Baptise M.D.   On: 06/15/2020 18:35   CT ANGIO NECK W OR WO CONTRAST  Result Date: 06/16/2020 CLINICAL DATA:  Cerebellar infarct.  Headache and dizziness. EXAM: CT ANGIOGRAPHY HEAD AND NECK TECHNIQUE: Multidetector CT imaging of the head and neck was performed using the standard protocol during bolus administration of intravenous contrast. Multiplanar CT image reconstructions and MIPs were obtained to evaluate the vascular anatomy. Carotid stenosis measurements (when applicable) are obtained utilizing NASCET criteria, using the distal internal carotid diameter as the denominator. CONTRAST:  61mL OMNIPAQUE IOHEXOL 350 MG/ML SOLN COMPARISON:  CT head without contrast 06/15/2020. MR head without contrast 06/16/2020. FINDINGS: CT HEAD FINDINGS Brain: Acute/subacute nonhemorrhagic infarct is again noted in the inferomedial left cerebellum and posteromedial right cerebellum. No new infarcts are present. No acute hemorrhage is present. Supratentorial structures are within normal limits. Ventricles are normal. No significant extraaxial fluid collection is present. Vascular: Atherosclerotic changes are present within the cavernous internal carotid arteries bilaterally without a hyperdense vessel. Skull: Calvarium is intact. No focal lytic or blastic lesions are present. No significant extracranial soft tissue lesion is present. Sinuses: The paranasal sinuses and mastoid air cells are clear. Orbits: The globes and orbits are within normal limits. Review of the MIP images confirms the above findings CTA NECK FINDINGS Aortic arch: A 3  vessel arch configuration is present. Minimal atherosclerotic changes present without stenosis or aneurysm. Right carotid system: The right common carotid artery is within normal limits. Minimal irregularity is present at the bifurcation without significant stenosis. Subtle narrowing is present in the mid cervical right ICA without significant stenosis. Minimal vessel wall irregularity is present. Left carotid system: The left common carotid artery is within normal limits. The bifurcation is unremarkable. The proximal left ICA is normal. High-grade stenosis is present in the proximal left external carotid artery. There is some vessel wall irregularity in the mid cervical left ICA without significant stenosis. Vertebral arteries: The left vertebral artery is the dominant vessel. Scratched at the right vertebral artery is the dominant vessel, originating from the subclavian artery. High-grade stenosis present at the origin of the left vertebral artery with occlusion less than 1 cm from  the origin. The vessel is reconstituted at the level of C6. A hypoplastic left vertebral artery demonstrates no other significant stenoses or occlusion. No significant stenosis is present in the cervical right vertebral artery. Skeleton: Degenerative changes of the cervical spine are most evident at C4-5, C5-6, and C6-7 with uncovertebral spurring and reversal of the normal cervical lordosis. No focal lytic or blastic lesions are present. Vertebral body heights are maintained. Other neck: The soft tissues of the neck are otherwise within normal limits. Upper chest: Lung apices are clear. Thoracic inlet is within normal limits. Review of the MIP images confirms the above findings CTA HEAD FINDINGS Anterior circulation: Minimal atherosclerotic changes are present within the cavernous internal carotid arteries bilaterally without stenosis. Internal carotid arteries are otherwise within normal limits through the ICA termini. The A1 and M1  segments are normal. The anterior communicating artery is patent. MCA bifurcations are normal. ACA and MCA branch vessels are within normal limits. Posterior circulation: The right vertebral artery is the dominant vessel. PICA origins are visualized and normal. Hypoplastic V4 segment extends to the vertebrobasilar junction on the left. The basilar artery is normal. Both posterior cerebral arteries originate from the basilar tip. The PCA branch vessels are within normal limits. Venous sinuses: The dural sinuses are patent. The straight sinus and deep cerebral veins are patent. The right transverse sinus is dominant. Cortical veins are unremarkable. Anatomic variants: None Review of the MIP images confirms the above findings IMPRESSION: 1. High-grade stenosis at the origin of the left vertebral artery with occlusion less than 1 cm from the origin. The vessel is reconstituted at the level of C6. 2. The right vertebral artery is dominant. 3. High-grade stenosis at the origin of the left external carotid artery. 4. Minimal atherosclerotic changes at the right carotid bifurcation and cavernous internal carotid arteries bilaterally without significant stenosis. 5. Minimal vessel wall irregularity in the mid cervical internal carotid arteries bilaterally without significant stenosis. This may represent fibromuscular dysplasia. 6. Multilevel spondylosis of the cervical spine. Electronically Signed   By: San Morelle M.D.   On: 06/16/2020 06:58   MR BRAIN WO CONTRAST  Result Date: 06/16/2020 CLINICAL DATA:  Episode of dizziness and mild-to-moderate headache 2 days ago. Nausea. EXAM: MRI HEAD WITHOUT CONTRAST TECHNIQUE: Multiplanar, multiecho pulse sequences of the brain and surrounding structures were obtained without intravenous contrast. COMPARISON:  MR head without contrast scratched at CT head without contrast 06/15/2020. MR head without contrast 10/08/2019 FINDINGS: Brain: Acute/subacute nonhemorrhagic  infarct is confirmed in the inferomedial left cerebellum. Additional posterior cortical infarct is present in the medial right cerebellum. Infarct involves the inferior vermis scattered more superior posterior infarcts. No acute or subacute supratentorial infarct is present. T2 signal changes are associated with the areas of acute/subacute infarct. No significant supratentorial white matter disease is present. The ventricles are of normal size. No significant extraaxial fluid collection is present. Vascular: Prominent vein extends into the superior vermis, likely DVA. Flow is present in the major intracranial arteries. No other venous abnormalities are present. Skull and upper cervical spine: Mild multilevel degenerative changes are present in the upper cervical spine, most prominent at C3-4. Marrow signal is normal. Vertebral body heights are normal. Craniocervical junction is normal. Midline structures are unremarkable. Sinuses/Orbits: The paranasal sinuses and mastoid air cells are clear. The globes and orbits are within normal limits. IMPRESSION: 1. Acute/subacute nonhemorrhagic infarct involving the inferomedial left cerebellum. 2. Additional acute/subacute nonhemorrhagic posterior cortical infarct in the medial right cerebellum. 3. No acute  or subacute supratentorial infarct. Electronically Signed   By: San Morelle M.D.   On: 06/16/2020 06:44    Assessment/Plan: Diagnosis: Inferomedial left cerebellum infarct with additional nonhemorrhagic posterior cortical infarct in the medial right cerebellum.   Labs independently reviewed.  Records reviewed and summated above.  1. Does the need for close, 24 hr/day medical supervision in concert with the patient's rehab needs make it unreasonable for this patient to be served in a less intensive setting? Potentially  2. Co-Morbidities requiring supervision/potential complications: anxiety (ensure anxiety and resulting apprehension do not limit functional  progress; consider prn medications if warranted), left breast cancer with radiation chemotherapy, hyperlipidemia, HTN (monitor and provide prns in accordance with increased physical exertion and pain), ABLA (repeat labs, consider transfusion if necessary to ensure appropriate perfusion for increased activity tolerance) 3. Due to safety, disease management and patient education, does the patient require 24 hr/day rehab nursing? Potentially 4. Does the patient require coordinated care of a physician, rehab nurse, therapy disciplines of PT/OT to address physical and functional deficits in the context of the above medical diagnosis(es)? Potentially Addressing deficits in the following areas: balance, endurance, locomotion, strength, transferring, toileting and psychosocial support 5. Can the patient actively participate in an intensive therapy program of at least 3 hrs of therapy per day at least 5 days per week? Yes 6. The potential for patient to make measurable gains while on inpatient rehab is excellent 7. Anticipated functional outcomes upon discharge from inpatient rehab are supervision  with PT, modified independent and supervision with OT, n/a with SLP. 8. Estimated rehab length of stay to reach the above functional goals is: 10-14 days. 9. Anticipated discharge destination: Home 10. Overall Rehab/Functional Prognosis: good  RECOMMENDATIONS: This patient's condition is appropriate for continued rehabilitative care in the following setting: CIR after completion of medical work-up. Patient has agreed to participate in recommended program. Yes Note that insurance prior authorization may be required for reimbursement for recommended care.  Comment: Rehab Admissions Coordinator to follow up.  I have personally performed a face to face diagnostic evaluation, including, but not limited to relevant history and physical exam findings, of this patient and developed relevant assessment and plan.   Additionally, I have reviewed and concur with the physician assistant's documentation above.   Delice Lesch, MD, ABPMR Lavon Paganini Angiulli, PA-C 06/16/2020

## 2020-06-16 NOTE — Progress Notes (Signed)
PROGRESS NOTE    Kristi [name]  MGQ:[id number] DOB: September 26, 1956 DOA: 06/15/2020 PCP: Mosie Lukes, MD    Brief Narrative:  Patient admitted to the hospital with acute/subacute nonhemorrhagic infarct involving the inferior medial left cerebellum/posterior cortical medial right cerebellum.  64 year old female who presented with dizziness.  She does have the significant past medical history for breast cancer, hypertension, anemia, neuropathy and depression.  Patient reported intermittent but persistent dizziness over the last 48 hours prior to hospitalization.  On her initial physical examination blood pressure 101/51, heart rate 83, respiratory rate 23, temperature 98.6, oxygen saturation 98%, her lungs were clear to auscultation bilaterally, heart S1-S2, present rhythmic, soft abdomen, no lower extremity edema.  Brain MRI with acute/subacute nonhemorrhagic infarct involving the inferior medial left cerebellum.  Acute/subacute nonhemorrhagic posterior cortical infarct in the medial right cerebellum. EKG 57 bpm, normal axis, normal intervals, sinus rhythm, no ST segment T wave changes, positive LVH.  Assessment & Plan:   Principal Problem:   Acute CVA (cerebrovascular accident) [facility]) Active Problems:   Primary cancer of upper inner quadrant of left female breast ([place])   HTN (hypertension)   1. Acute/ subacute nonhemmorrhagic infarct inferior medial left cerebellum. Patient continue with dizziness today, worse with movement, no headache, no nausea or vomiting. LDL 89.  CT angiography with high grade stenosis at the origin of the left vertebral artery with occlusion less than 1 dm from the origin. Positive reconstitution. Right vertebral artery is dominant. High grade stenosis at the origin of the left external carotid artery.   Continue with dual antiplatelet therapy with asa and clopidogrel, Blood pressure control with irbesartan.  Follow with PT/OT and neurology recommendations.   2.  HTN. Continue blood pressure control with irbesartan. Blood pressure is 109.74 mmHg this am.   3. Mild hypercalcemia. Mild elevation in Ca up to 10,4, will follow up on renal function and electrolytes in am.   4. Neuropathy. On gabapentin.   5. Hx of breast cancer. Continue follow up as outpatient.   6. Depression. Continue latuda and trazodone.   7. Chronic anemia. Stable cell count  Patient continue to be at high risk for worsening CVA   Status is: Inpatient  Remains inpatient appropriate because:Inpatient level of care appropriate due to severity of illness   Dispo: The patient is from: Home              Anticipated d/c is to: Home              Anticipated d/c date is: 2 days              Patient currently is not medically stable to d/c.   DVT prophylaxis: Enoxaparin   Code Status:   full  Family Communication:  I spoke with patient's family member at the bedside, we talked in detail about patient's condition, plan of care and prognosis and all questions were addressed.   Consultants:   Neurology     Subjective: Patient continue to have dizziness, while in moving, no nausea or vomiting, no chest pain or dyspnea.   Objective: Vitals:   06/16/20 0102 06/16/20 0300 06/16/20 0552 06/16/20 0755  BP:  118/65 120/68 (!) 111/57  Pulse:  72 65 60  Resp:  (!) 25 13 17   Temp:  98.7 F (37.1 C)  98.7 F (37.1 C)  TempSrc:  Oral  Oral  SpO2:  97% 100% 97%  Weight: 72.2 kg     Height: 5\' 2"  (1.575 m)  No intake or output data in the 24 hours ending 06/16/20 1131 Filed Weights   06/15/20 1406 06/16/20 0102  Weight: 73 kg 72.2 kg    Examination:   General: Not in pain or dyspnea, deconditioned  Neurology: Awake and alert, non focal  E ENT: no pallor, no icterus, oral mucosa moist Cardiovascular: No JVD. S1-S2 present, rhythmic, no gallops, rubs, or murmurs. No lower extremity edema. Pulmonary:  positive breath sounds bilaterally, adequate air movement, no  wheezing, rhonchi or rales. Gastrointestinal. Abdomen soft and non tender Skin. No rashes Musculoskeletal: no joint deformities     Data Reviewed: I have personally reviewed following labs and imaging studies  CBC: Recent Labs  Lab 06/15/20 1929 06/16/20 0801  WBC 6.5 6.1  NEUTROABS 3.1  --   HGB 11.5* 10.6*  HCT 35.7* 33.0*  MCV 85.6 85.5  PLT 183 371   Basic Metabolic Panel: Recent Labs  Lab 06/15/20 1929 06/16/20 0801  NA 137  --   K 4.1  --   CL 99  --   CO2 29  --   GLUCOSE 105*  --   BUN 14  --   CREATININE 1.09* 1.19*  CALCIUM 10.4*  --    GFR: Estimated Creatinine Clearance: 43.8 mL/min (A) (by C-G formula based on SCr of 1.19 mg/dL (H)). Liver Function Tests: Recent Labs  Lab 06/15/20 1929  AST 24  ALT 23  ALKPHOS 71  BILITOT 0.4  PROT 7.5  ALBUMIN 4.2   No results for input(s): LIPASE, AMYLASE in the last 168 hours. No results for input(s): AMMONIA in the last 168 hours. Coagulation Profile: Recent Labs  Lab 06/15/20 1929  INR 1.0   Cardiac Enzymes: No results for input(s): CKTOTAL, CKMB, CKMBINDEX, TROPONINI in the last 168 hours. BNP (last 3 results) No results for input(s): PROBNP in the last 8760 hours. HbA1C: Recent Labs    06/16/20 0801  HGBA1C 5.9*   CBG: Recent Labs  Lab 06/15/20 1700  GLUCAP 122*   Lipid Profile: Recent Labs    06/16/20 0801  CHOL 144  HDL 41  LDLCALC 89  TRIG 68  CHOLHDL 3.5   Thyroid Function Tests: No results for input(s): TSH, T4TOTAL, FREET4, T3FREE, THYROIDAB in the last 72 hours. Anemia Panel: No results for input(s): VITAMINB12, FOLATE, FERRITIN, TIBC, IRON, RETICCTPCT in the last 72 hours.    Radiology Studies: I have reviewed all of the imaging during this hospital visit personally     Scheduled Meds: . aspirin EC  325 mg Oral Daily  . [START ON 06/17/2020] clopidogrel  75 mg Oral Daily  . enoxaparin (LOVENOX) injection  40 mg Subcutaneous Q24H  . gabapentin  700 mg Oral  BID  . irbesartan  300 mg Oral Daily  . lurasidone  40 mg Oral QHS  . traZODone  50 mg Oral QHS   Continuous Infusions: . sodium chloride 75 mL/hr at 06/16/20 0213     LOS: 0 days        Manoah Deckard Gerome Apley, MD

## 2020-06-16 NOTE — Evaluation (Signed)
Physical Therapy Evaluation Patient Details Name: Kristi [name] MRN: [id number] DOB: 09-10-1956 Today's Date: 06/16/2020   History of Present Illness  64yo female presenting to [facility] with dizziness. CTH shows acute L cerebellar infarct, MRI shows B subacute cerebellar infarcts. No tpa given. PMH hx breast CA s/p radiation and chemo therapies, HTN, HLD, neuropathy, L shoulder arthoscopy and rotator cuff repair  Clinical Impression   Patient received standing with OT, pleasant and willing to participate in PT. VSS during session with positional changes. Tolerated gait training approximately 51f with Min-ModA and HHA of 2 persons for safety and balance given severely impaired gait pattern which involved considerable ataxia and scissoring, poor proprioception also noted. Interestingly, RAM patterns more impaired in [place] Also demonstrates severe central vestibular impairment with VOR testing- was given appropriate VOR and saccade exercises to begin addressing this as well, educated on technique to maintain gaze on static target to help reduce dizziness with dynamic tasks. Left in bed with all needs met, bed alarm active and husband present. Emphatically recommend CIR moving forward.     Follow Up Recommendations CIR    Equipment Recommendations  Rolling walker with 5" wheels;3in1 (PT)    Recommendations for Other Services       Precautions / Restrictions Precautions Precautions: Fall;Other (comment) Precaution Comments: severe central vestibular impairment Restrictions Weight Bearing Restrictions: No      Mobility  Bed Mobility Overal bed mobility: Needs Assistance Bed Mobility: Sit to Supine       Sit to supine: Supervision;HOB elevated   General bed mobility comments: S for safety, increased time and effort, assist to manage lines  Transfers Overall transfer level: Needs assistance   Transfers: Sit to/from Stand Sit to Stand: Min guard;+2 physical  assistance         General transfer comment: min guardx2 for safety with functional transfers; BP stable in standing/with postional changes  Ambulation/Gait Ambulation/Gait assistance: Min assist;Mod assist Gait Distance (Feet): 16 Feet Assistive device: 2 person hand held assist Gait Pattern/deviations: Step-through pattern;Ataxic;Scissoring;Staggering left;Staggering right;Narrow base of support Gait velocity: decreased   General Gait Details: very unsteady with gait, frequent scissoring and increased leaning to the left, poor proprioception noted as she had reduced awareness of gait pattern and how to correct. RPE 8/10.  Stairs            Wheelchair Mobility    Modified Rankin (Stroke Patients Only) Modified Rankin (Stroke Patients Only) Pre-Morbid Rankin Score: No symptoms Modified Rankin: Moderately severe disability     Balance Overall balance assessment: Needs assistance Sitting-balance support: Feet supported Sitting balance-Leahy Scale: Fair Sitting balance - Comments: did not challenge balance dynamically   Standing balance support: Bilateral upper extremity supported;During functional activity Standing balance-Leahy Scale: Poor Standing balance comment: Min-ModAx2 to maintain safety and balance with gait                             Pertinent Vitals/Pain Pain Assessment: No/denies pain    Home Living Family/patient expects to be discharged to:: Private residence Living Arrangements: Spouse/significant other Available Help at Discharge: Family Type of Home: House Home Access: Stairs to enter   ETechnical brewerof Steps: 2 Home Layout: Two level;Bed/bath upstairs;Able to live on main level with bedroom/bathroom Home Equipment: CKasandra Knudsen- single point Additional Comments: shower is on 2nd floor     Prior Function Level of Independence: Independent         Comments: independent  and driving      Hand Dominance   Dominant Hand:  Left    Extremity/Trunk Assessment   Upper Extremity Assessment Upper Extremity Assessment: Defer to OT evaluation    Lower Extremity Assessment Lower Extremity Assessment: RLE deficits/detail;LLE deficits/detail RLE Deficits / Details: ankle dorsiflexors 5/5, quad 5/5, hip flexor 3+/5 RLE Sensation: decreased proprioception;history of peripheral neuropathy RLE Coordination: decreased gross motor LLE Deficits / Details: ankle dorsiflexors 5/5, quad 5/5, hip flexor 3+/5 LLE Sensation: decreased proprioception;history of peripheral neuropathy LLE Coordination: decreased gross motor    Cervical / Trunk Assessment Cervical / Trunk Assessment: Normal  Communication   Communication: No difficulties  Cognition Arousal/Alertness: Awake/alert Behavior During Therapy: Flat affect Overall Cognitive Status: Within Functional Limits for tasks assessed                                        General Comments      Exercises     Assessment/Plan    PT Assessment Patient needs continued PT services  PT Problem List Decreased strength;Decreased knowledge of use of DME;Decreased activity tolerance;Decreased safety awareness;Decreased balance;Decreased mobility;Decreased coordination;Impaired sensation       PT Treatment Interventions DME instruction;Balance training;Gait training;Neuromuscular re-education;Stair training;Functional mobility training;Patient/family education;Therapeutic activities;Therapeutic exercise    PT Goals (Current goals can be found in the Care Plan section)  Acute Rehab PT Goals Patient Stated Goal: get well again PT Goal Formulation: With patient/family Time For Goal Achievement: 06/30/20 Potential to Achieve Goals: Good    Frequency Min 4X/week   Barriers to discharge        Co-evaluation               AM-PAC PT "6 Clicks" Mobility  Outcome Measure Help needed turning from your back to your side while in a flat bed without using  bedrails?: A Little Help needed moving from lying on your back to sitting on the side of a flat bed without using bedrails?: A Little Help needed moving to and from a bed to a chair (including a wheelchair)?: A Lot Help needed standing up from a chair using your arms (e.g., wheelchair or bedside chair)?: A Lot Help needed to walk in hospital room?: A Lot Help needed climbing 3-5 steps with a railing? : A Lot 6 Click Score: 14    End of Session Equipment Utilized During Treatment: Gait belt Activity Tolerance: Patient tolerated treatment well Patient left: in bed;with call bell/phone within reach;with bed alarm set;with family/visitor present Nurse Communication: Mobility status PT Visit Diagnosis: Unsteadiness on feet (R26.81);Ataxic gait (R26.0);Other symptoms and signs involving the nervous system (R29.898);Difficulty in walking, not elsewhere classified (R26.2);Dizziness and giddiness (R42)    Time: 5638-9373 PT Time Calculation (min) (ACUTE ONLY): 17 min   Charges:   PT Evaluation $PT Eval Moderate Complexity: 1 Mod         Windell Norfolk, DPT, PN1   Supplemental Physical Therapist [place]    Pager [phone] Acute Rehab Office [phone]

## 2020-06-16 NOTE — Progress Notes (Signed)
STROKE TEAM PROGRESS NOTE   SUBJECTIVE (INTERVAL HISTORY) No family is at the bedside.  Overall her condition is gradually improving.  Patient lying in bed, sleepy but arousable and follows simple commands, orientated x3.  Still has mild left arm and leg dysmetria, but otherwise neurologically intact.  CT and MRI confirmed left PICA moderate infarct as well as small punctate right PICA territory infarcts.  Patient denies any headache, head or neck trauma, heart palpitation or racing heart.   OBJECTIVE Temp:  [98.4 F (36.9 C)-98.7 F (37.1 C)] 98.4 F (36.9 C) (08/11 1549) Pulse Rate:  [55-85] 85 (08/11 1549) Cardiac Rhythm: Sinus bradycardia (08/11 0804) Resp:  [13-25] 18 (08/11 1549) BP: (101-123)/(48-74) 117/65 (08/11 1549) SpO2:  [97 %-100 %] 98 % (08/11 1549) Weight:  [72.2 kg] 72.2 kg (08/11 0102)  Recent Labs  Lab 06/15/20 1700  GLUCAP 122*   Recent Labs  Lab 06/15/20 1929 06/16/20 0801  NA 137  --   K 4.1  --   CL 99  --   CO2 29  --   GLUCOSE 105*  --   BUN 14  --   CREATININE 1.09* 1.19*  CALCIUM 10.4*  --    Recent Labs  Lab 06/15/20 1929  AST 24  ALT 23  ALKPHOS 71  BILITOT 0.4  PROT 7.5  ALBUMIN 4.2   Recent Labs  Lab 06/15/20 1929 06/16/20 0801  WBC 6.5 6.1  NEUTROABS 3.1  --   HGB 11.5* 10.6*  HCT 35.7* 33.0*  MCV 85.6 85.5  PLT 183 161   No results for input(s): CKTOTAL, CKMB, CKMBINDEX, TROPONINI in the last 168 hours. Recent Labs    06/15/20 1929  LABPROT 13.0  INR 1.0   No results for input(s): COLORURINE, LABSPEC, PHURINE, GLUCOSEU, HGBUR, BILIRUBINUR, KETONESUR, PROTEINUR, UROBILINOGEN, NITRITE, LEUKOCYTESUR in the last 72 hours.  Invalid input(s): APPERANCEUR     Component Value Date/Time   CHOL 144 06/16/2020 0801   TRIG 68 06/16/2020 0801   HDL 41 06/16/2020 0801   CHOLHDL 3.5 06/16/2020 0801   VLDL 14 06/16/2020 0801   LDLCALC 89 06/16/2020 0801   Lab Results  Component Value Date   HGBA1C 5.9 (H) 06/16/2020       Component Value Date/Time   LABOPIA NONE DETECTED 05/14/2014 1825   COCAINSCRNUR NONE DETECTED 05/14/2014 1825   LABBENZ POSITIVE (A) 05/14/2014 1825   AMPHETMU NONE DETECTED 05/14/2014 1825   THCU NONE DETECTED 05/14/2014 1825   LABBARB NONE DETECTED 05/14/2014 1825    Recent Labs  Lab 06/15/20 1929  ETH <10    I have personally reviewed the radiological images below and agree with the radiology interpretations.  CT ANGIO HEAD W OR WO CONTRAST  Result Date: 06/16/2020 CLINICAL DATA:  Cerebellar infarct.  Headache and dizziness. EXAM: CT ANGIOGRAPHY HEAD AND NECK TECHNIQUE: Multidetector CT imaging of the head and neck was performed using the standard protocol during bolus administration of intravenous contrast. Multiplanar CT image reconstructions and MIPs were obtained to evaluate the vascular anatomy. Carotid stenosis measurements (when applicable) are obtained utilizing NASCET criteria, using the distal internal carotid diameter as the denominator. CONTRAST:  80mL OMNIPAQUE IOHEXOL 350 MG/ML SOLN COMPARISON:  CT head without contrast 06/15/2020. MR head without contrast 06/16/2020. FINDINGS: CT HEAD FINDINGS Brain: Acute/subacute nonhemorrhagic infarct is again noted in the inferomedial left cerebellum and posteromedial right cerebellum. No new infarcts are present. No acute hemorrhage is present. Supratentorial structures are within normal limits. Ventricles are normal. No significant  extraaxial fluid collection is present. Vascular: Atherosclerotic changes are present within the cavernous internal carotid arteries bilaterally without a hyperdense vessel. Skull: Calvarium is intact. No focal lytic or blastic lesions are present. No significant extracranial soft tissue lesion is present. Sinuses: The paranasal sinuses and mastoid air cells are clear. Orbits: The globes and orbits are within normal limits. Review of the MIP images confirms the above findings CTA NECK FINDINGS Aortic arch: A 3  vessel arch configuration is present. Minimal atherosclerotic changes present without stenosis or aneurysm. Right carotid system: The right common carotid artery is within normal limits. Minimal irregularity is present at the bifurcation without significant stenosis. Subtle narrowing is present in the mid cervical right ICA without significant stenosis. Minimal vessel wall irregularity is present. Left carotid system: The left common carotid artery is within normal limits. The bifurcation is unremarkable. The proximal left ICA is normal. High-grade stenosis is present in the proximal left external carotid artery. There is some vessel wall irregularity in the mid cervical left ICA without significant stenosis. Vertebral arteries: The left vertebral artery is the dominant vessel. Scratched at the right vertebral artery is the dominant vessel, originating from the subclavian artery. High-grade stenosis present at the origin of the left vertebral artery with occlusion less than 1 cm from the origin. The vessel is reconstituted at the level of C6. A hypoplastic left vertebral artery demonstrates no other significant stenoses or occlusion. No significant stenosis is present in the cervical right vertebral artery. Skeleton: Degenerative changes of the cervical spine are most evident at C4-5, C5-6, and C6-7 with uncovertebral spurring and reversal of the normal cervical lordosis. No focal lytic or blastic lesions are present. Vertebral body heights are maintained. Other neck: The soft tissues of the neck are otherwise within normal limits. Upper chest: Lung apices are clear. Thoracic inlet is within normal limits. Review of the MIP images confirms the above findings CTA HEAD FINDINGS Anterior circulation: Minimal atherosclerotic changes are present within the cavernous internal carotid arteries bilaterally without stenosis. Internal carotid arteries are otherwise within normal limits through the ICA termini. The A1 and M1  segments are normal. The anterior communicating artery is patent. MCA bifurcations are normal. ACA and MCA branch vessels are within normal limits. Posterior circulation: The right vertebral artery is the dominant vessel. PICA origins are visualized and normal. Hypoplastic V4 segment extends to the vertebrobasilar junction on the left. The basilar artery is normal. Both posterior cerebral arteries originate from the basilar tip. The PCA branch vessels are within normal limits. Venous sinuses: The dural sinuses are patent. The straight sinus and deep cerebral veins are patent. The right transverse sinus is dominant. Cortical veins are unremarkable. Anatomic variants: None Review of the MIP images confirms the above findings IMPRESSION: 1. High-grade stenosis at the origin of the left vertebral artery with occlusion less than 1 cm from the origin. The vessel is reconstituted at the level of C6. 2. The right vertebral artery is dominant. 3. High-grade stenosis at the origin of the left external carotid artery. 4. Minimal atherosclerotic changes at the right carotid bifurcation and cavernous internal carotid arteries bilaterally without significant stenosis. 5. Minimal vessel wall irregularity in the mid cervical internal carotid arteries bilaterally without significant stenosis. This may represent fibromuscular dysplasia. 6. Multilevel spondylosis of the cervical spine. Electronically Signed   By: San Morelle M.D.   On: 06/16/2020 06:58   CT Head Wo Contrast  Result Date: 06/15/2020 CLINICAL DATA:  Dizziness EXAM: CT HEAD WITHOUT  CONTRAST TECHNIQUE: Contiguous axial images were obtained from the base of the skull through the vertex without intravenous contrast. COMPARISON:  MRI 10/08/2019 FINDINGS: Brain: There is area of low-density noted in the inferior left cerebellar hemisphere compatible with acute infarct. Subtle low-density noted in the left temporal, parietal and occipital lobes which I favor is  artifactual, but given the acute infarct in the left cerebellum, cannot completely exclude infarct. No hemorrhage or hydrocephalus. Vascular: No hyperdense vessel or unexpected calcification. Skull: No acute calvarial abnormality. Sinuses/Orbits: Visualized paranasal sinuses and mastoids clear. Orbital soft tissues unremarkable. Other: None IMPRESSION: Area of low-density in the inferior left cerebellar hemisphere compatible with acute infarction. Subtle decreased attenuation throughout much of the left temporal, parietal and occipital lobes which I favor is artifactual. No hemorrhage. Electronically Signed   By: Rolm Baptise M.D.   On: 06/15/2020 18:35   CT ANGIO NECK W OR WO CONTRAST  Result Date: 06/16/2020 CLINICAL DATA:  Cerebellar infarct.  Headache and dizziness. EXAM: CT ANGIOGRAPHY HEAD AND NECK TECHNIQUE: Multidetector CT imaging of the head and neck was performed using the standard protocol during bolus administration of intravenous contrast. Multiplanar CT image reconstructions and MIPs were obtained to evaluate the vascular anatomy. Carotid stenosis measurements (when applicable) are obtained utilizing NASCET criteria, using the distal internal carotid diameter as the denominator. CONTRAST:  73mL OMNIPAQUE IOHEXOL 350 MG/ML SOLN COMPARISON:  CT head without contrast 06/15/2020. MR head without contrast 06/16/2020. FINDINGS: CT HEAD FINDINGS Brain: Acute/subacute nonhemorrhagic infarct is again noted in the inferomedial left cerebellum and posteromedial right cerebellum. No new infarcts are present. No acute hemorrhage is present. Supratentorial structures are within normal limits. Ventricles are normal. No significant extraaxial fluid collection is present. Vascular: Atherosclerotic changes are present within the cavernous internal carotid arteries bilaterally without a hyperdense vessel. Skull: Calvarium is intact. No focal lytic or blastic lesions are present. No significant extracranial soft  tissue lesion is present. Sinuses: The paranasal sinuses and mastoid air cells are clear. Orbits: The globes and orbits are within normal limits. Review of the MIP images confirms the above findings CTA NECK FINDINGS Aortic arch: A 3 vessel arch configuration is present. Minimal atherosclerotic changes present without stenosis or aneurysm. Right carotid system: The right common carotid artery is within normal limits. Minimal irregularity is present at the bifurcation without significant stenosis. Subtle narrowing is present in the mid cervical right ICA without significant stenosis. Minimal vessel wall irregularity is present. Left carotid system: The left common carotid artery is within normal limits. The bifurcation is unremarkable. The proximal left ICA is normal. High-grade stenosis is present in the proximal left external carotid artery. There is some vessel wall irregularity in the mid cervical left ICA without significant stenosis. Vertebral arteries: The left vertebral artery is the dominant vessel. Scratched at the right vertebral artery is the dominant vessel, originating from the subclavian artery. High-grade stenosis present at the origin of the left vertebral artery with occlusion less than 1 cm from the origin. The vessel is reconstituted at the level of C6. A hypoplastic left vertebral artery demonstrates no other significant stenoses or occlusion. No significant stenosis is present in the cervical right vertebral artery. Skeleton: Degenerative changes of the cervical spine are most evident at C4-5, C5-6, and C6-7 with uncovertebral spurring and reversal of the normal cervical lordosis. No focal lytic or blastic lesions are present. Vertebral body heights are maintained. Other neck: The soft tissues of the neck are otherwise within normal limits. Upper chest: Lung  apices are clear. Thoracic inlet is within normal limits. Review of the MIP images confirms the above findings CTA HEAD FINDINGS Anterior  circulation: Minimal atherosclerotic changes are present within the cavernous internal carotid arteries bilaterally without stenosis. Internal carotid arteries are otherwise within normal limits through the ICA termini. The A1 and M1 segments are normal. The anterior communicating artery is patent. MCA bifurcations are normal. ACA and MCA branch vessels are within normal limits. Posterior circulation: The right vertebral artery is the dominant vessel. PICA origins are visualized and normal. Hypoplastic V4 segment extends to the vertebrobasilar junction on the left. The basilar artery is normal. Both posterior cerebral arteries originate from the basilar tip. The PCA branch vessels are within normal limits. Venous sinuses: The dural sinuses are patent. The straight sinus and deep cerebral veins are patent. The right transverse sinus is dominant. Cortical veins are unremarkable. Anatomic variants: None Review of the MIP images confirms the above findings IMPRESSION: 1. High-grade stenosis at the origin of the left vertebral artery with occlusion less than 1 cm from the origin. The vessel is reconstituted at the level of C6. 2. The right vertebral artery is dominant. 3. High-grade stenosis at the origin of the left external carotid artery. 4. Minimal atherosclerotic changes at the right carotid bifurcation and cavernous internal carotid arteries bilaterally without significant stenosis. 5. Minimal vessel wall irregularity in the mid cervical internal carotid arteries bilaterally without significant stenosis. This may represent fibromuscular dysplasia. 6. Multilevel spondylosis of the cervical spine. Electronically Signed   By: San Morelle M.D.   On: 06/16/2020 06:58   MR BRAIN WO CONTRAST  Result Date: 06/16/2020 CLINICAL DATA:  Episode of dizziness and mild-to-moderate headache 2 days ago. Nausea. EXAM: MRI HEAD WITHOUT CONTRAST TECHNIQUE: Multiplanar, multiecho pulse sequences of the brain and  surrounding structures were obtained without intravenous contrast. COMPARISON:  MR head without contrast scratched at CT head without contrast 06/15/2020. MR head without contrast 10/08/2019 FINDINGS: Brain: Acute/subacute nonhemorrhagic infarct is confirmed in the inferomedial left cerebellum. Additional posterior cortical infarct is present in the medial right cerebellum. Infarct involves the inferior vermis scattered more superior posterior infarcts. No acute or subacute supratentorial infarct is present. T2 signal changes are associated with the areas of acute/subacute infarct. No significant supratentorial white matter disease is present. The ventricles are of normal size. No significant extraaxial fluid collection is present. Vascular: Prominent vein extends into the superior vermis, likely DVA. Flow is present in the major intracranial arteries. No other venous abnormalities are present. Skull and upper cervical spine: Mild multilevel degenerative changes are present in the upper cervical spine, most prominent at C3-4. Marrow signal is normal. Vertebral body heights are normal. Craniocervical junction is normal. Midline structures are unremarkable. Sinuses/Orbits: The paranasal sinuses and mastoid air cells are clear. The globes and orbits are within normal limits. IMPRESSION: 1. Acute/subacute nonhemorrhagic infarct involving the inferomedial left cerebellum. 2. Additional acute/subacute nonhemorrhagic posterior cortical infarct in the medial right cerebellum. 3. No acute or subacute supratentorial infarct. Electronically Signed   By: San Morelle M.D.   On: 06/16/2020 06:44   ECHOCARDIOGRAM COMPLETE  Result Date: 06/16/2020    ECHOCARDIOGRAM REPORT   Patient Name:   Kristi [name] Date of Exam: 06/16/2020 Medical Rec #:  [id number]      Height:       62.0 in Accession #:    [id number]     Weight:       159.2 lb Date of Birth:  [birth year]  BSA:          1.735 m Patient Age:    71 years        BP:           111/57 mmHg Patient Gender: F              HR:           74 bpm. Exam Location:  Inpatient Procedure: 2D Echo, Color Doppler and Cardiac Doppler Indications:    Stroke 434.91  History:        Patient has prior history of Echocardiogram examinations, most                 recent 07/21/2013. Risk Factors:Hypertension, Dyslipidemia and                 Non-Smoker.  Sonographer:    Vickie Epley RDCS Referring Phys: [place]  Sonographer Comments: Image acquisition challenging due to mastectomy. IMPRESSIONS  1. Left ventricular ejection fraction, by estimation, is 60 to 65%. The left ventricle has normal function. The left ventricle has no regional wall motion abnormalities. Left ventricular diastolic parameters are consistent with Grade I diastolic dysfunction (impaired relaxation).  2. Right ventricular systolic function is normal. The right ventricular size is normal. Tricuspid regurgitation signal is inadequate for assessing PA pressure.  3. The mitral valve is normal in structure. No evidence of mitral valve regurgitation. No evidence of mitral stenosis.  4. The aortic valve is tricuspid. Aortic valve regurgitation is not visualized. No aortic stenosis is present.  5. The inferior vena cava is normal in size with greater than 50% respiratory variability, suggesting right atrial pressure of 3 mmHg. FINDINGS  Left Ventricle: Left ventricular ejection fraction, by estimation, is 60 to 65%. The left ventricle has normal function. The left ventricle has no regional wall motion abnormalities. The left ventricular internal cavity size was normal in size. There is  no left ventricular hypertrophy. Left ventricular diastolic parameters are consistent with Grade I diastolic dysfunction (impaired relaxation). Right Ventricle: The right ventricular size is normal. No increase in right ventricular wall thickness. Right ventricular systolic function is normal. Tricuspid regurgitation signal is inadequate  for assessing PA pressure. Left Atrium: Left atrial size was normal in size. Right Atrium: Right atrial size was normal in size. Pericardium: There is no evidence of pericardial effusion. Mitral Valve: The mitral valve is normal in structure. No evidence of mitral valve regurgitation. No evidence of mitral valve stenosis. Tricuspid Valve: The tricuspid valve is normal in structure. Tricuspid valve regurgitation is trivial. Aortic Valve: The aortic valve is tricuspid. Aortic valve regurgitation is not visualized. No aortic stenosis is present. Pulmonic Valve: The pulmonic valve was normal in structure. Pulmonic valve regurgitation is not visualized. Aorta: The aortic root is normal in size and structure. Venous: The inferior vena cava is normal in size with greater than 50% respiratory variability, suggesting right atrial pressure of 3 mmHg. IAS/Shunts: No atrial level shunt detected by color flow Doppler.  LEFT VENTRICLE PLAX 2D LVIDd:         4.80 cm     Diastology LVIDs:         3.70 cm     LV e' lateral:   11.70 cm/s LV PW:         0.50 cm     LV E/e' lateral: 6.7 LV IVS:        0.50 cm     LV e' medial:  7.94 cm/s LVOT diam:     1.80 cm     LV E/e' medial:  9.8 LV SV:         59 LV SV Index:   34 LVOT Area:     2.54 cm  LV Volumes (MOD) LV vol d, MOD A2C: 77.8 ml LV vol d, MOD A4C: 75.8 ml LV vol s, MOD A2C: 35.4 ml LV vol s, MOD A4C: 31.5 ml LV SV MOD A2C:     42.4 ml LV SV MOD A4C:     75.8 ml LV SV MOD BP:      41.4 ml RIGHT VENTRICLE RV S prime:     13.30 cm/s TAPSE (M-mode): 2.1 cm LEFT ATRIUM             Index       RIGHT ATRIUM          Index LA diam:        3.60 cm 2.08 cm/m  RA Area:     8.53 cm LA Vol (A2C):   29.1 ml 16.77 ml/m RA Volume:   15.60 ml 8.99 ml/m LA Vol (A4C):   24.2 ml 13.95 ml/m LA Biplane Vol: 29.4 ml 16.95 ml/m  AORTIC VALVE LVOT Vmax:   117.00 cm/s LVOT Vmean:  77.700 cm/s LVOT VTI:    0.231 m  AORTA Ao Root diam: 2.70 cm MITRAL VALVE MV Area (PHT): 4.39 cm    SHUNTS MV  Decel Time: 173 msec    Systemic VTI:  0.23 m MV E velocity: 78.00 cm/s  Systemic Diam: 1.80 cm MV A velocity: 73.70 cm/s MV E/A ratio:  1.06 Loralie Champagne MD Electronically signed by Loralie Champagne MD Signature Date/Time: 06/16/2020/7:00:28 PM    Final     PHYSICAL EXAM  Temp:  [98.4 F (36.9 C)-98.7 F (37.1 C)] 98.4 F (36.9 C) (08/11 1549) Pulse Rate:  [55-85] 85 (08/11 1549) Resp:  [13-25] 18 (08/11 1549) BP: (101-123)/(48-74) 117/65 (08/11 1549) SpO2:  [97 %-100 %] 98 % (08/11 1549) Weight:  [72.2 kg] 72.2 kg (08/11 0102)  General - Well nourished, well developed, in no apparent distress but sleepy.  Ophthalmologic - fundi not visualized due to noncooperation.  Cardiovascular - Regular rhythm and rate.  Mental Status -  Sleepy but orientation to time, place, and person were intact. Language including expression, naming, repetition, comprehension was assessed and found intact. Fund of Knowledge was assessed and was intact.  Cranial Nerves II - XII - II - Visual field intact OU. III, IV, VI - Extraocular movements intact. V - Facial sensation intact bilaterally. VII - Facial movement intact bilaterally. VIII - Hearing & vestibular intact bilaterally. X - Palate elevates symmetrically. XI - Chin turning & shoulder shrug intact bilaterally. XII - Tongue protrusion intact.  Motor Strength - The patient's strength was normal in all extremities and pronator drift was absent.  Bulk was normal and fasciculations were absent.   Motor Tone - Muscle tone was assessed at the neck and appendages and was normal.  Reflexes - The patient's reflexes were symmetrical in all extremities and she had no pathological reflexes.  Sensory - Light touch, temperature/pinprick were assessed and were symmetrical.    Coordination - The patient had normal movements in the right hand and foot with no ataxia or dysmetria.  However left finger-to-nose and heel-to-shin mild dysmetria.  Tremor was  absent.  Gait and Station - deferred.   ASSESSMENT/PLAN Ms. KEIANDRA SULLENGER is a 64 y.o. female  with history of breast cancer in remission, neuropathy, anxiety, depression, hypertension and hyperlipidemia admitted for gait imbalance, vertigo, nausea vomiting and headache. No tPA given due to outside window.    Stroke: Moderate left PICA and small/punctate right PICA territory infarcts, likely due to left vertebral artery stenosis/occlusion.  However cardioembolic source cannot be completely ruled out at this time.    CT head left inferior cerebellum low density, concerning for acute infarct  MRI acute/subacute infarct involving inferomedial left cerebellum and small/punctate posterior medial right cerebellum.  CTA head and neck high-grade stenosis left VA origin with occlusion, right to be a dominant.  2D Echo EF 60 to 65%.  Consider outpatient 30-day cardiac event monitoring to rule A. fib  LDL 89  HgbA1c 5.9  Lovenox for VTE prophylaxis  No antithrombotic prior to admission, now on aspirin 325 mg daily and clopidogrel 75 mg daily.  Continue DAPT for 3 months and then aspirin alone.  Patient counseled to be compliant with her antithrombotic medications  Ongoing aggressive stroke risk factor management  Therapy recommendations: CIR  Disposition: Pending - close neuro watch for the next 2 to 3 days to ensure no hydrocephalus or transtentorial herniation develops.  Left vertebral artery occlusion  CTA head and neck showed left VA high-grade stenosis at origin and then occluded  Nondominant left VA but reconstituted at the C6 level  Lack of significance atherosclerosis elsewhere  On DAPT  Recommend 30-day cardiac event monitoring to rule out A. fib  Hypertension . Stable . Home medication include Valsartan and HCTZ  Long term BP goal normotensive  Hyperlipidemia  Home meds:  none   LDL 89, goal < 70  Now on Lipitor 40  Continue statin at discharge  Other  Stroke Risk Factors  Advanced age  Other Active Problems  Breast cancer in remission  Neuropathy  Anxiety  Depression  Hospital day # 0  Rosalin Hawking, MD PhD Stroke Neurology 06/16/2020 7:01 PM    To contact Stroke Continuity provider, please refer to [email or web address] After hours, contact General Neurology

## 2020-06-16 NOTE — H&P (Signed)
History and Physical    Kristi [name]:[id number] DOB: 06-09-1956 DOA: 06/15/2020  PCP: Mosie Lukes, MD  Patient coming from: Home.  Chief Complaint: Dizziness.  HPI: Kristi [name] is a 64 y.o. female with history of breast cancer in remission, hypertension, anemia, neuropathy and depression presents to the ER at [facility] with complaints of dizziness.  Patient states the dizziness started from waking up yesterday morning and has been persistent off and on for last 48 hours.  Denies any weakness of the upper or lower extremities or any visual symptoms or difficulty swallowing or speaking.  ED Course: In the ER on exam patient had good strength in all extremities.  CT head was showing left cerebellar infarct.  On-call neurologist Dr. Cheral Marker was consulted and patient admitted for further management.  EKG shows normal sinus rhythm.  Labs show anemia.  Covid test is negative.  Review of Systems: As per HPI, rest all negative.   Past Medical History:  Diagnosis Date  . Anemia    Iron deficinecy anemia  . Anemia 05/14/2017  . Anxiety   . Anxiety and depression 05/15/2014  . Arthritis   . Breast cancer ([place])    left ,last radiation 2'15, last chemo 8'14  . Constipation 11/13/2016  . Depression   . History of chicken pox   . History of radiation therapy 09/09/13-10/28/13   45 gray to left breast, lumpectomy cavity boosted to 63 gray  . HTN (hypertension) 11/13/2016  . Hyperglycemia 01/09/2016  . Hyperlipidemia 05/14/2017  . Hypertension   . MRSA (methicillin resistant Staphylococcus aureus) 2009   right groin area-no issues now. 04-07-14 PCR screen negative today.  . Neuropathy   . Preventative health care 11/13/2016    Past Surgical History:  Procedure Laterality Date  . ANAL SPHINCTEROTOMY  04/2011  . APPENDECTOMY  1980  . AXILLARY LYMPH NODE DISSECTION Left 02/04/2013   Procedure: LEFT AXILLARY LYMPH NODE DISSECTION;  Surgeon: Stark Klein, MD;  Location: [place];   Service: General;  Laterality: Left;  End: 5638  . BREAST LUMPECTOMY WITH NEEDLE LOCALIZATION Left 02/04/2013   Procedure: LEFT BREAST LUMPECTOMY WITH NEEDLE LOCALIZATION;  Surgeon: Stark Klein, MD;  Location: [place];  Service: General;  Laterality: Left;  . BREAST SURGERY     Lumpectomy in april 2014  . HEMORRHOID SURGERY  04/2011   ligation  . MASTECTOMY Left 02/15/2017  . PORT-A-CATH REMOVAL N/A 04/16/2014   Procedure: REMOVAL PORT-A-CATH;  Surgeon: Stark Klein, MD;  Location: [facility];  Service: General;  Laterality: N/A;  . PORTACATH PLACEMENT Right 02/04/2013   Procedure: INSERTION PORT-A-CATH;  Surgeon: Stark Klein, MD;  Location: [place];  Service: General;  Laterality: Right;  Start Time: 7564.  Marland Kitchen SHOULDER ARTHROSCOPY WITH ROTATOR CUFF REPAIR AND SUBACROMIAL DECOMPRESSION Left 02/24/2014   Procedure: SHOULDER ARTHROSCOPY WITH ROTATOR CUFF REPAIR AND SUBACROMIAL DECOMPRESSION;  Surgeon: Meredith Pel, MD;  Location: [place];  Service: Orthopedics;  Laterality: Left;  LEFT SHOULDER DIAGNOSTIC OPERATIVE ARTHROSCOPY, SUBACROMIAL DECOMPRESSION, ROTATOR CUFF TEAR REPAIR  . SIMPLE MASTECTOMY WITH AXILLARY SENTINEL NODE BIOPSY Left 02/15/2017   Procedure: LEFT MASTECTOMY;  Surgeon: Stark Klein, MD;  Location: [place];  Service: General;  Laterality: Left;  . TOTAL MASTECTOMY Right 12/26/2018   Procedure: RIGHT BREAST PROPHYLATIC MASTECTOMY;  Surgeon: Stark Klein, MD;  Location: [place];  Service: General;  Laterality: Right;     reports that she has never smoked. She has never used smokeless tobacco. She reports  that she does not drink alcohol and does not use drugs.  No Known Allergies  Family History  Problem Relation Age of Onset  . Lung cancer Father   . Hypertension Father   . Thyroid cancer Father        dx in his 50s  . Cancer Father        lung, thyroid, smoker  . Breast cancer Paternal Aunt 27  . Colon cancer Paternal Aunt        dx in her 50x  . Cervical cancer Paternal Aunt         dzx in her 23s  . Ovarian cancer Cousin        dx in her lage 52s  . Breast cancer Cousin        maternal first cousin, once removed; dx in her late 49s  . Breast cancer Cousin        maternal first cousin once removed; dx in late 42s  . Hypertension Mother   . Diabetes Mother   . Dementia Mother   . Memory loss Mother   . Hypertension Brother   . Seizures Brother        Alcohol induced.  . Alcohol abuse Brother        drinker, smoker  . Cancer Paternal Uncle        oral cancer  . Kidney cancer Paternal Grandmother   . Arthritis Daughter        back surgery  . Cancer Cousin        several paternal cousins with brain cancer, leukemia, and other cancers  . Cancer Sister        stomach    Prior to Admission medications   Medication Sig Start Date End Date Taking? Authorizing Provider  anastrozole (ARIMIDEX) 1 MG tablet Take 1 tablet (1 mg total) by mouth daily. TAKE 1 TABLET (1 MG) BY MOUTH DAILY IN THE MORNING 05/21/20   Nicholas Lose, MD  Apoaequorin (PREVAGEN) 10 MG CAPS Take 1 capsule by mouth daily. 05/21/20   Nicholas Lose, MD  aspirin EC 81 MG tablet Take 1 tablet (81 mg total) by mouth daily. Patient not taking: Reported on 05/26/2020 06/19/19   Mosie Lukes, MD  gabapentin (NEURONTIN) 800 MG tablet Take 1 tablet (800 mg total) by mouth 3 (three) times daily. 05/05/20   Raulkar, Clide Deutscher, MD  HYDROcodone-acetaminophen (NORCO) 10-325 MG tablet Take 1 tablet by mouth every 6 (six) hours as needed. 05/19/20   Raulkar, Clide Deutscher, MD  hydrOXYzine (ATARAX/VISTARIL) 25 MG tablet Take 25 mg by mouth 2 (two) times daily. 25 to 75 mg daily 10/01/19   [provider]  KLOR-CON M20 20 MEQ tablet TAKE 1 TABLET BY MOUTH EVERY DAY Patient not taking: Reported on 05/26/2020 05/16/19   Mosie Lukes, MD  LATUDA 80 MG TABS tablet Take 0.5 tablets (40 mg total) by mouth at bedtime. 05/21/20   Nicholas Lose, MD  magnesium 30 MG tablet Take 250 mg by mouth once.    [provider]  Multiple Vitamin (MULTIVITAMIN WITH MINERALS) TABS tablet Take 1 tablet by mouth daily.    [provider]  traZODone (DESYREL) 50 MG tablet  04/25/20   [provider]  valsartan-hydrochlorothiazide (DIOVAN-HCT) 320-25 MG tablet TAKE 1 TABLET BY MOUTH EVERY DAY IN THE MORNING 12/16/19   Mosie Lukes, MD  prochlorperazine (COMPAZINE) 10 MG tablet Take 1 tablet (10 mg total) by mouth every 6 (six) hours as needed (  Nausea or vomiting). 05/07/13 12/08/19  Gardenia Phlegm, NP  prochlorperazine (COMPAZINE) 25 MG suppository Place 1 suppository (25 mg total) rectally every 12 (twelve) hours as needed for nausea. 05/07/13 07/24/13  Gardenia Phlegm, NP    Physical Exam: Constitutional: Moderately built and nourished. Vitals:   06/15/20 2203 06/15/20 2307 06/16/20 0100 06/16/20 0102  BP: (!) 101/51 (!) 104/48 101/63   Pulse: 72 83 67   Resp: (!) 22 18 (!) 23   Temp:   98.6 F (37 C)   TempSrc:   Oral   SpO2: 99% 100% 98%   Weight:    72.2 kg  Height:    5\' 2"  (1.575 m)   Eyes: Anicteric no pallor. ENMT: No discharge from the ears eyes nose or mouth. Neck: No mass felt.  No neck rigidity. Respiratory: No rhonchi or crepitations. Cardiovascular: S1-S2 heard. Abdomen: Soft nontender bowel sounds present. Musculoskeletal: No edema. Skin: No rash. Neurologic: Alert awake oriented to time place and person.  Moves all extremities 5 x 5.  No nystagmus no facial asymmetry.  Tongue is midline. Psychiatric: Appears normal.  Normal affect.   Labs on Admission: I have personally reviewed following labs and imaging studies  CBC: Recent Labs  Lab 06/15/20 1929  WBC 6.5  NEUTROABS 3.1  HGB 11.5*  HCT 35.7*  MCV 85.6  PLT 202   Basic Metabolic Panel: Recent Labs  Lab 06/15/20 1929  NA 137  K 4.1  CL 99  CO2 29  GLUCOSE 105*  BUN 14  CREATININE 1.09*  CALCIUM 10.4*   GFR: Estimated Creatinine Clearance: 47.8 mL/min (A) (by C-G formula  based on SCr of 1.09 mg/dL (H)). Liver Function Tests: Recent Labs  Lab 06/15/20 1929  AST 24  ALT 23  ALKPHOS 71  BILITOT 0.4  PROT 7.5  ALBUMIN 4.2   No results for input(s): LIPASE, AMYLASE in the last 168 hours. No results for input(s): AMMONIA in the last 168 hours. Coagulation Profile: Recent Labs  Lab 06/15/20 1929  INR 1.0   Cardiac Enzymes: No results for input(s): CKTOTAL, CKMB, CKMBINDEX, TROPONINI in the last 168 hours. BNP (last 3 results) No results for input(s): PROBNP in the last 8760 hours. HbA1C: No results for input(s): HGBA1C in the last 72 hours. CBG: Recent Labs  Lab 06/15/20 1700  GLUCAP 122*   Lipid Profile: No results for input(s): CHOL, HDL, LDLCALC, TRIG, CHOLHDL, LDLDIRECT in the last 72 hours. Thyroid Function Tests: No results for input(s): TSH, T4TOTAL, FREET4, T3FREE, THYROIDAB in the last 72 hours. Anemia Panel: No results for input(s): VITAMINB12, FOLATE, FERRITIN, TIBC, IRON, RETICCTPCT in the last 72 hours. Urine analysis:    Component Value Date/Time   COLORURINE YELLOW 07/18/2013 1929   APPEARANCEUR CLEAR 07/18/2013 1929   LABSPEC 1.019 07/18/2013 1929   PHURINE 5.0 07/18/2013 1929   GLUCOSEU NEGATIVE 07/18/2013 1929   HGBUR NEGATIVE 07/18/2013 1929   BILIRUBINUR NEGATIVE 07/18/2013 1929   KETONESUR NEGATIVE 07/18/2013 1929   PROTEINUR NEGATIVE 07/18/2013 1929   UROBILINOGEN 0.2 07/18/2013 1929   NITRITE NEGATIVE 07/18/2013 1929   LEUKOCYTESUR SMALL (A) 07/18/2013 1929   Sepsis Labs: @LABRCNTIP (procalcitonin:4,lacticidven:4) ) Recent Results (from the past 240 hour(s))  SARS Coronavirus 2 by RT PCR (hospital order, performed in [place] hospital lab) Nasopharyngeal Nasopharyngeal Swab     Status: None   Collection Time: 06/15/20  7:29 PM   Specimen: Nasopharyngeal Swab  Result Value Ref Range Status   SARS Coronavirus 2 NEGATIVE NEGATIVE Final  Comment: (NOTE) SARS-CoV-2 target nucleic acids are NOT  DETECTED.  The SARS-CoV-2 RNA is generally detectable in upper and lower respiratory specimens during the acute phase of infection. The lowest concentration of SARS-CoV-2 viral copies this assay can detect is 250 copies / mL. A negative result does not preclude SARS-CoV-2 infection and should not be used as the sole basis for treatment or other patient management decisions.  A negative result may occur with improper specimen collection / handling, submission of specimen other than nasopharyngeal swab, presence of viral mutation(s) within the areas targeted by this assay, and inadequate number of viral copies (<250 copies / mL). A negative result must be combined with clinical observations, patient history, and epidemiological information.  Fact Sheet for Patients:   StrictlyIdeas.no  Fact Sheet for Healthcare Providers: BankingDealers.co.za  This test is not yet approved or  cleared by the [place] FDA and has been authorized for detection and/or diagnosis of SARS-CoV-2 by FDA under an Emergency Use Authorization (EUA).  This EUA will remain in effect (meaning this test can be used) for the duration of the COVID-19 declaration under Section 564(b)(1) of the Act, 21 U.S.C. section 360bbb-3(b)(1), unless the authorization is terminated or revoked sooner.  Performed at [facility], [address] [place], [name] [address]      Radiological Exams on Admission: CT Head Wo Contrast  Result Date: 06/15/2020 CLINICAL DATA:  Dizziness EXAM: CT HEAD WITHOUT CONTRAST TECHNIQUE: Contiguous axial images were obtained from the base of the skull through the vertex without intravenous contrast. COMPARISON:  MRI 10/08/2019 FINDINGS: Brain: There is area of low-density noted in the inferior left cerebellar hemisphere compatible with acute infarct. Subtle low-density noted in the left temporal, parietal and occipital lobes which I  favor is artifactual, but given the acute infarct in the left cerebellum, cannot completely exclude infarct. No hemorrhage or hydrocephalus. Vascular: No hyperdense vessel or unexpected calcification. Skull: No acute calvarial abnormality. Sinuses/Orbits: Visualized paranasal sinuses and mastoids clear. Orbital soft tissues unremarkable. Other: None IMPRESSION: Area of low-density in the inferior left cerebellar hemisphere compatible with acute infarction. Subtle decreased attenuation throughout much of the left temporal, parietal and occipital lobes which I favor is artifactual. No hemorrhage. Electronically Signed   By: Rolm Baptise M.D.   On: 06/15/2020 18:35    EKG: Independently reviewed.  Normal sinus rhythm.  Possible LVH.  Assessment/Plan Principal Problem:   Acute CVA (cerebrovascular accident) [facility]) Active Problems:   Primary cancer of upper inner quadrant of left female breast ([place])   HTN (hypertension)    1. Acute CVA -discussed with on-call neurologist Dr. Cheral Marker who advised getting CT angiogram of the head and neck and 2D echo.  Patient passed swallow.  On aspirin.  Check lipid panel hemoglobin A1c physical therapy consult.  Gentle hydration.  Allow for permissive hypertension. 2. Hypertension on ARB and hydrochlorothiazide.  I am holding of hydrochlorothiazide gently hydrating allow for permissive hypertension. 3. Mild hypercalcemia likely from dehydration and also will hold off hydrochlorothiazide.  Follow metabolic panel after hydration. 4. History of breast cancer in remission on anastrozole. 5. History of neuropathy on gabapentin. 6. History of depression on Latuda and trazodone. 7. Chronic anemia follow CBC.  Given that patient has acute CVA will need close monitoring for any further worsening in inpatient status.   DVT prophylaxis: Lovenox. Code Status: Full code. Family Communication: Discussed with patient. Disposition Plan: Home. Consults called:  Neurology. Admission status: Inpatient.   Rise Patience  MD [facility] Pager [phone].  If 7PM-7AM, please contact night-coverage [email or web address] Password [email or web address]  06/16/2020, 1:40 AM

## 2020-06-16 NOTE — Evaluation (Addendum)
Occupational Therapy Evaluation Patient Details Name: Kristi [name] MRN: [id number] DOB: 17-May-1956 Today's Date: 06/16/2020    History of Present Illness 64yo female presenting to [facility] with dizziness. CTH shows acute L cerebellar infarct, MRI shows B subacute cerebellar infarcts. No tpa given. PMH hx breast CA s/p radiation and chemo therapies, HTN, HLD, neuropathy, L shoulder arthoscopy and rotator cuff repair   Clinical Impression   PTA patient independent and driving. Admitted for above and limited by problem list below, including dizziness with positional changes and head movement, impaired balance, decreased activity tolerance and impaired cognition (decreased problem solving and slow processing).  Pt endorses blurry vision with head turns/positional changes, but no nystagmus noted functionally--continue assessment; educated on gaze stabilization techniques but poor carryover during session requiring max cueing. Patient currently completing bed mobility with supervision, seated ADLs with min guard to supervision assist, standing ADLs with min to mod assist, and transfers with min assist.  In room mobility with min-mod assist +2 hand held assist, PT present at end of session and assisted with this.   Patient will benefit from continued OT services while admitted and after dc at CIR level to optimize return to PLOF and independence with ADLs, mobility.     Follow Up Recommendations  CIR    Equipment Recommendations  3 in 1 bedside commode    Recommendations for Other Services Rehab consult     Precautions / Restrictions Precautions Precautions: Fall;Other (comment) Precaution Comments: severe central vestibular impairment Restrictions Weight Bearing Restrictions: No      Mobility Bed Mobility Overal bed mobility: Needs Assistance Bed Mobility: Supine to Sit     Supine to sit: Supervision;HOB elevated Sit to supine: Supervision;HOB elevated   General bed  mobility comments: supervision for safety   Transfers Overall transfer level: Needs assistance   Transfers: Sit to/from Stand Sit to Stand: Min assist         General transfer comment: initally unable to transition without loosing balance, constant cueing for focused gaze with min assist to power up and steady    Balance Overall balance assessment: Needs assistance Sitting-balance support: Feet supported Sitting balance-Leahy Scale: Fair Sitting balance - Comments: min guard to close supervision but only within base of support   Standing balance support: Bilateral upper extremity supported;During functional activity Standing balance-Leahy Scale: Poor Standing balance comment: Min-ModAx2 to maintain safety and balance with gait                           ADL either performed or assessed with clinical judgement   ADL Overall ADL's : Needs assistance/impaired     Grooming: Min guard;Sitting   Upper Body Bathing: Min guard;Sitting   Lower Body Bathing: Sit to/from stand;Minimal assistance   Upper Body Dressing : Min guard;Sitting   Lower Body Dressing: Moderate assistance;Sit to/from stand   Toilet Transfer: Minimal assistance;+2 for safety/equipment;Ambulation           Functional mobility during ADLs: Minimal assistance;Moderate assistance;+2 for physical assistance;+2 for safety/equipment General ADL Comments: patient limited by dizziness, impaired balance       Vision Baseline Vision/History: Wears glasses Wears Glasses: At all times Patient Visual Report: Nausea/blurring vision with head movement Vision Assessment?: Yes Eye Alignment: Within Functional Limits Ocular Range of Motion: Within Functional Limits Alignment/Gaze Preference: Within Defined Limits Tracking/Visual Pursuits:  (decreased smoothness, pt reports difficulty following ) Visual Fields: No apparent deficits Additional Comments: increased dizziness with head turns,  no nystagmus  noted but continue to assess --initated education on focused gaze      Perception     Praxis      Pertinent Vitals/Pain Pain Assessment: No/denies pain     Hand Dominance Left   Extremity/Trunk Assessment Upper Extremity Assessment Upper Extremity Assessment: Overall WFL for tasks assessed   Lower Extremity Assessment Lower Extremity Assessment: RLE deficits/detail;LLE deficits/detail RLE Deficits / Details: ankle dorsiflexors 5/5, quad 5/5, hip flexor 3+/5 RLE Sensation: decreased proprioception;history of peripheral neuropathy RLE Coordination: decreased gross motor LLE Deficits / Details: ankle dorsiflexors 5/5, quad 5/5, hip flexor 3+/5 LLE Sensation: decreased proprioception;history of peripheral neuropathy LLE Coordination: decreased gross motor   Cervical / Trunk Assessment Cervical / Trunk Assessment: Normal   Communication Communication Communication: No difficulties   Cognition Arousal/Alertness: Awake/alert Behavior During Therapy: Flat affect Overall Cognitive Status: Impaired/Different from baseline Area of Impairment: Awareness;Problem solving                           Awareness: Emergent Problem Solving: Slow processing;Decreased initiation;Difficulty sequencing;Requires verbal cues General Comments: pt with flat affect, noted slow processing and poor problem solving    General Comments  VSS    Exercises     Shoulder Instructions      Home Living Family/patient expects to be discharged to:: Private residence Living Arrangements: Spouse/significant other Available Help at Discharge: Family Type of Home: House Home Access: Stairs to enter [employer] of Steps: 2   Home Layout: Two level;Bed/bath upstairs;Able to live on main level with bedroom/bathroom Alternate Level Stairs-Number of Steps: flight    Bathroom Shower/Tub: Occupational psychologist: Standard     Home Equipment: Kasandra Knudsen - single point   Additional  Comments: shower is on 2nd floor   Lives With: Spouse    Prior Functioning/Environment Level of Independence: Independent        Comments: independent and driving         OT Problem List: Decreased activity tolerance;Impaired balance (sitting and/or standing);Decreased knowledge of use of DME or AE;Decreased knowledge of precautions;Other (comment) (dizziness )      OT Treatment/Interventions: Self-care/ADL training;DME and/or AE instruction;Neuromuscular education;Therapeutic activities;Visual/perceptual remediation/compensation;Patient/family education;Balance training    OT Goals(Current goals can be found in the care plan section) Acute Rehab OT Goals Patient Stated Goal: get well again OT Goal Formulation: With patient Time For Goal Achievement: 06/30/20 Potential to Achieve Goals: Good  OT Frequency: Min 2X/week   Barriers to D/C:            Co-evaluation PT/OT/SLP Co-Evaluation/Treatment: Yes (pt entered halfway during session,) Reason for Co-Treatment: For patient/therapist safety;To address functional/ADL transfers   OT goals addressed during session: ADL's and self-care      AM-PAC OT "6 Clicks" Daily Activity     Outcome Measure Help from another person eating meals?: A Little Help from another person taking care of personal grooming?: A Little Help from another person toileting, which includes using toliet, bedpan, or urinal?: A Lot Help from another person bathing (including washing, rinsing, drying)?: A Little Help from another person to put on and taking off regular upper body clothing?: A Little Help from another person to put on and taking off regular lower body clothing?: A Lot 6 Click Score: 16   End of Session Equipment Utilized During Treatment: Gait belt Nurse Communication: Mobility status  Activity Tolerance: Patient tolerated treatment well Patient left: Other (comment) (seated EOB with PT )  OT Visit Diagnosis: Other abnormalities of gait  and mobility (R26.89);Dizziness and giddiness (R42);Other symptoms and signs involving cognitive function                Time: 3612-2449 OT Time Calculation (min): 21 min Charges:  OT General Charges $OT Visit: 1 Visit OT Evaluation $OT Eval Moderate Complexity: 1 Mod  Jolaine Artist, OT Acute Rehabilitation Services Pager [phone] Office [phone]   Delight Stare 06/16/2020, 12:37 PM

## 2020-06-17 ENCOUNTER — Encounter: Payer: Self-pay | Admitting: Family Medicine

## 2020-06-17 LAB — BASIC METABOLIC PANEL
Anion gap: 8 (ref 5–15)
BUN: 10 mg/dL (ref 8–23)
CO2: 27 mmol/L (ref 22–32)
Calcium: 9.4 mg/dL (ref 8.9–10.3)
Chloride: 104 mmol/L (ref 98–111)
Creatinine, Ser: 1.09 mg/dL — ABNORMAL HIGH (ref 0.44–1.00)
GFR calc Af Amer: >60 mL/min (ref >60)
GFR calc non Af Amer: 53 mL/min — ABNORMAL LOW (ref >60)
Glucose, Bld: 112 mg/dL — ABNORMAL HIGH (ref 70–99)
Potassium: 3.8 mmol/L (ref 3.5–5.1)
Sodium: 139 mmol/L (ref 135–145)

## 2020-06-17 MED ORDER — ANASTROZOLE 1 MG PO TABS
1.0000 mg | ORAL_TABLET | Freq: Every day | ORAL | Status: DC
  Administered 2020-06-17 – 2020-06-19 (×3): 1 mg via ORAL
  Filled 2020-06-17 (×3): qty 1

## 2020-06-17 NOTE — Progress Notes (Signed)
Physical Therapy Treatment Patient Details Name: Kristi [name] MRN: [id number] DOB: 02-22-1956 Today's Date: 06/17/2020    History of Present Illness 64yo female presenting to [facility] with dizziness. CTH shows acute L cerebellar infarct, MRI shows B subacute cerebellar infarcts. No tpa given. PMH hx breast CA s/p radiation and chemo therapies, HTN, HLD, neuropathy, L shoulder arthoscopy and rotator cuff repair    PT Comments    Pt received in bed, eager to participate in therapy. She required supervision bed mobility, min assist transfers, and mod/HHA ambulation 175'. She presents with decreased balance and ataxic gait. Pt with c/o mild dizziness. Current POC remains appropriate.     Follow Up Recommendations  CIR     Equipment Recommendations  Rolling walker with 5" wheels;3in1 (PT)    Recommendations for Other Services       Precautions / Restrictions Precautions Precautions: Fall;Other (comment) Precaution Comments: central vestibular impairment    Mobility  Bed Mobility Overal bed mobility: Needs Assistance Bed Mobility: Supine to Sit     Supine to sit: Supervision;HOB elevated     General bed mobility comments: +rail, supervision for safety  Transfers Overall transfer level: Needs assistance Equipment used: None Transfers: Sit to/from Stand Sit to Stand: Min assist         General transfer comment: increased time, assist to power up and stabilize balance  Ambulation/Gait Ambulation/Gait assistance: Mod assist;+2 safety/equipment Gait Distance (Feet): 175 Feet Assistive device: 1 person hand held assist Gait Pattern/deviations: Step-through pattern;Ataxic;Staggering left;Staggering right;Narrow base of support Gait velocity: decreased Gait velocity interpretation: <1.8 ft/sec, indicate of risk for recurrent falls General Gait Details: multiple LOB requiring mod assist to recover. Slow, guarded gait. Very stiff at trunk/neck with limited to  no head movement.   Stairs             Wheelchair Mobility    Modified Rankin (Stroke Patients Only) Modified Rankin (Stroke Patients Only) Pre-Morbid Rankin Score: No symptoms Modified Rankin: Moderately severe disability     Balance Overall balance assessment: Needs assistance Sitting-balance support: Feet supported Sitting balance-Leahy Scale: Fair     Standing balance support: Single extremity supported;During functional activity Standing balance-Leahy Scale: Poor Standing balance comment: reliant on external support                            Cognition Arousal/Alertness: Awake/alert Behavior During Therapy: Flat affect Overall Cognitive Status: Impaired/Different from baseline Area of Impairment: Awareness;Problem solving                           Awareness: Emergent Problem Solving: Slow processing;Decreased initiation;Difficulty sequencing;Requires verbal cues        Exercises      General Comments General comments (skin integrity, edema, etc.): VSS      Pertinent Vitals/Pain Pain Assessment: No/denies pain    Home Living                      Prior Function            PT Goals (current goals can now be found in the care plan section) Acute Rehab PT Goals Patient Stated Goal: get well again Progress towards PT goals: Progressing toward goals    Frequency    Min 4X/week      PT Plan Current plan remains appropriate    Co-evaluation  AM-PAC PT "6 Clicks" Mobility   Outcome Measure  Help needed turning from your back to your side while in a flat bed without using bedrails?: A Little Help needed moving from lying on your back to sitting on the side of a flat bed without using bedrails?: A Little Help needed moving to and from a bed to a chair (including a wheelchair)?: A Lot Help needed standing up from a chair using your arms (e.g., wheelchair or bedside chair)?: A Lot Help needed to  walk in hospital room?: A Lot Help needed climbing 3-5 steps with a railing? : A Lot 6 Click Score: 14    End of Session Equipment Utilized During Treatment: Gait belt Activity Tolerance: Patient tolerated treatment well Patient left: in chair;with call bell/phone within reach;with family/visitor present Nurse Communication: Mobility status PT Visit Diagnosis: Unsteadiness on feet (R26.81);Ataxic gait (R26.0);Other symptoms and signs involving the nervous system (R29.898);Difficulty in walking, not elsewhere classified (R26.2);Dizziness and giddiness (R42)     Time: 0518-3358 PT Time Calculation (min) (ACUTE ONLY): 24 min  Charges:  $Gait Training: 23-37 mins                     Lorrin Goodell, [place]  Office # [phone] Pager [phone]    Kristi [name] 06/17/2020, 1:15 PM

## 2020-06-17 NOTE — Progress Notes (Signed)
IP rehab admissions - I met with patient, her daughter at the bedside and I spoke with husband by phone.  I gave them rehab booklets, explained inpatient rehab and described rehab program.  Patient would like CIR admission.  I will open the case and request acute inpatient rehab admission.  I will update all once I hear back from insurance case manager.  Call me for questions.  [phone]

## 2020-06-17 NOTE — Progress Notes (Addendum)
STROKE TEAM PROGRESS NOTE   Interval History  She is currently resting at bed with her daughter at bedside. An update regarding her stroke and stroke work up was given to the patients daughter with the patients husband put on speaker phone. She is doing well today and had no acute events overnight. She does endorse a headache, bi frontal that is described as dull pain rated 2/10. She also noted this head pain last night. RN at bedside was asked to try tylenol for what is likely a tension headache.   Objective  Temp:  [98.4 F (36.9 C)-99.3 F (37.4 C)] 98.9 F (37.2 C) (08/12 0820) Pulse Rate:  [62-85] 81 (08/12 0820) Cardiac Rhythm: Sinus bradycardia (08/12 0701) Resp:  [17-19] 18 (08/12 0820) BP: (108-125)/(64-74) 125/72 (08/12 0820) SpO2:  [97 %-100 %] 98 % (08/12 0820)  Stroke Labs:   Lipid panel:    Component Value Date/Time   CHOL 144 06/16/2020 0801   TRIG 68 06/16/2020 0801   HDL 41 06/16/2020 0801   CHOLHDL 3.5 06/16/2020 0801   VLDL 14 06/16/2020 0801   LDLCALC 89 06/16/2020 0801   Lab Results  Component Value Date   HGBA1C 5.9 (H) 06/16/2020      Component Value Date/Time   LABOPIA NONE DETECTED 05/14/2014 1825   COCAINSCRNUR NONE DETECTED 05/14/2014 1825   LABBENZ POSITIVE (A) 05/14/2014 1825   AMPHETMU NONE DETECTED 05/14/2014 1825   THCU NONE DETECTED 05/14/2014 1825   LABBARB NONE DETECTED 05/14/2014 1825    Recent Labs  Lab 06/15/20 1929  ETH <10   Pertinent Imaging:   I have personally reviewed the radiological images below and agree with the radiology interpretations.  06/15/20 CT Head WO Contrast  Area of low density in the inferior left cerebellar hemisphere compatible with acute infarction. Subtle decreased attenuation throughout much of the left temporal parietal and occipital lobes favored to be artifactual   06/16/20 CT Angio Head and Neck  1. High-grade stenosis at the origin of the left vertebral artery with occlusion less than 1 cm from  the origin. The vessel is reconstituted at the level of C6. 2. The right vertebral artery is dominant. 3. High-grade stenosis at the origin of the left external carotid artery. 4. Minimal atherosclerotic changes at the right carotid bifurcation and cavernous internal carotid arteries bilaterally without significant stenosis. 5. Minimal vessel wall irregularity in the mid cervical internal carotid arteries bilaterally without significant stenosis. This may represent fibromuscular dysplasia. 6. Multilevel spondylosis of the cervical spine.  06/16/20 MRI Brain WO Contrast Acute/subacute non hemorrhagic infarct involving the inferomedial left cerebellum.Additional acute/subacute nonhemorrhagic posterior cortical infarct in the medial right cerebellum. No acute or subacute supratentorial infarct.  06/16/20 Echocardiogram Complete  1. Left ventricular ejection fraction, by estimation, is 60 to 65%. The left ventricle has normal function. The left ventricle has no regional wall motion abnormalities. Left ventricular diastolic parameters are consistent with Grade I diastolic dysfunction (impaired relaxation).  2. Right ventricular systolic function is normal. The right ventricular size is normal.Tricuspid regurgitation signal is inadequate for assessing PA pressure.  3. The mitral valve is normal in structure. No evidence of mitral valve regurgitation. No evidence of mitral stenosis.  4. The aortic valve is tricuspid. Aortic valve regurgitation is not visualized. No aortic stenosis is present.  5. The inferior vena cava is normal in size with greater than 50% respiratory variability, suggesting right atrial pressure of 3 mmHg.   Physical Examination  General: Well nourished, well developed in  no apparent distress MS: AAOx4, follows commands  Speech: Fluent, naming and repetition intact, no dysarthria  Cranial nerves: EOMI, VFF, Face is symmetric, Sensation intact bilaterally to V1/V2/V3,  shoulder shrug intact  Strength: 5/5 to all extremities Coordination: No ataxia noted w/FNF + HTS  Sensation: Intact to light touch throughout all extremities  Gait: Deferred  NIHSS 0  Assessment and Plan:  Ms. LORA CHAVERS is a 64 y.o. female w/pmh of breast cancer in remission, neuropathy, anxiety, depression, hypertension, hyperlipidemia who presents with gait imbalance, vertigo, nausea, vomiting and headache she was found to have bilateral cerebellar stroke. She was outside the time window for both IVTPA and thrombectomy given her LKW was 48 hours prior to admission.   NEURO #Moderate L PICA and Small Right PICA Stroke  #Left vertebral artery occlusion  At this time, stroke work up is essentially complete. Her MRI was notable for bilateral PICA territory stroke with more stroke volume noted on the left in comparison to the right. Her CTA was significant for posterior circulation disease specifically high grade stenosis of the left vertebral artery with occlusion and reconstitution. No significant atherosclerosis elsewhere.   Stroke labs were done including Lipid panel w/LDL 89, Hemoglobin A1C 5.9. Echocardiogram showed relatively normal cardiac function w/EF 60-65 % with no intracardiac source of stroke. Given her CTA findings, it is likely that her stroke etiology is due to LAA in the setting of atherosclerosis however cardio embolic cause can not be excluded. She will need a 30 day cardiac event monitor at discharge. For secondary stroke prevention, she was started on DAPT and Atorvastatin 40 mg.  Given that her stroke burden is in the cerebellum with the potential to swell/cause hydrocephalus + transtentorial herniation, recommend close neurological monitoring. Repeat CTH on 06/18/20 in the morning to evaluate stability of stroke. From a stroke stand point, if [facility] is stable she will be medically ready to discharge to CIR.   - DAPT for 3 months then aspirin monotherapy (3 months given  severe vertebral artery stenosis/occlusion) - Atorvastatin 40 mg for secondary stroke prevention  - Cardiac monitor at discharge to rule out atrial fibrillation  - F/U CTH, ordered for 06/18/20 at [facility] #HTN  Blood pressure is currently trending normotensive. At home she takes valsartan-hydrochlorothiazide 320-25 tablet once daily. She is currently on Irbesartan 300 mg QD and pressure look good on this. She is outside of the permissive HTN window given her sx started 48 hours prior to admission so she is essentially 4 days out from her stroke as of 06/17/20. Her long term blood pressure goal is < 140/90. - Continue Irbesartan 300 QD   #HLD  Atorvastatin 40 mg was started this admission. Her LDL was noted to be 89 goal LDL is <70 from a stroke prevention standpoint. Continue Atorvastatin 40 mg at discharge.   #Breast Cancer  She follows with oncology for breast cancer that is in remission and was last seen on 05/21/20. She was diagnosed with cancer of the left breast in 2014, subsequently treated with lumpectomy,adjuvant chemotherapy,radiation, bilateral mastectomies and is currently onantiestrogen therapy with anastrozole.  #Other Stroke Risk Factors Other stroke risk factors include advanced age.   Other Active Problems  Neuropathy  Anxiety  Depression  Hospital day # 1  Ruta Hinds, NP  [facility] Nurse Practitioner  Patient seen and discussed with attending physician Dr. Erlinda Hong  06/17/2020 11:24 AM  ATTENDING NOTE: I reviewed above note and agree with the assessment and plan.  Pt was seen and examined.   Pt daughter at bedside. Pt lying in bed, on the phone. She is doing well, awake alert and orientated. No complains. No HA. Neuro exam no obvious deficit but slight left FTN and HTS dysmetria. Will repeat CT head in am to evaluate cerebellar edema. Continue DAPT and statin. Recommend 30 day cardiac event monitoring as outpt. Will follow  Rosalin Hawking, MD  PhD Stroke Neurology 06/17/2020 11:17 PM     To contact Stroke Continuity provider, please refer to [email or web address] After hours, contact General Neurology

## 2020-06-17 NOTE — Progress Notes (Signed)
PROGRESS NOTE    Kristi [name]  VCB:[id number] DOB: [birth year] DOA: 06/15/2020 PCP: Mosie Lukes, MD    Brief Narrative:  Patient admitted to the hospital with acute/subacute nonhemorrhagic infarct involving the inferior medial left cerebellum/posterior cortical medial right cerebellum.  64 year old female who presented with dizziness.  She does have the significant past medical history for breast cancer, hypertension, anemia, neuropathy and depression.  Patient reported intermittent but persistent dizziness over the last 48 hours prior to hospitalization.  On her initial physical examination blood pressure 101/51, heart rate 83, respiratory rate 23, temperature 98.6, oxygen saturation 98%, her lungs were clear to auscultation bilaterally, heart S1-S2, present rhythmic, soft abdomen, no lower extremity edema.  Brain MRI with acute/subacute nonhemorrhagic infarct involving the inferior medial left cerebellum.  Acute/subacute nonhemorrhagic posterior cortical infarct in the medial right cerebellum. EKG 57 bpm, normal axis, normal intervals, sinus rhythm, no ST segment T wave changes, positive LVH.  Further work up with CT angiography with high grade stenosis at the origin of the left vertebral artery with occlusion less than 1 dm from the origin. Positive reconstitution. Right vertebral artery is dominant. High grade stenosis at the origin of the left external carotid artery.   Patient has been placed on dual antiplatelet therapy with good toleration. Continue close neuro checks, for possible development of hydrocephalus or transtentorial herniation.   Assessment & Plan:   Principal Problem:   Acute CVA (cerebrovascular accident) [facility]) Active Problems:   Primary cancer of upper inner quadrant of left female breast ([place])   HTN (hypertension)   Cerebrovascular accident (CVA) ([place])   Vertigo   Generalized anxiety disorder   Benign essential HTN   Dyslipidemia   1. Acute/ subacute  nonhemmorrhagic infarct inferior medial left cerebellum./ high grade stenosis at the origin of the left vertebral artery and high grade stenosis at the origin of left external carotid artery. Patient with improved dizziness but not yet back to baseline.  Tolerating well dual antiplatelet therapy with asa and clopidogrel,(asa 325 mg daily and clopidogrel 75 mg daily for 3 months, then continue with aspirin alone). Continue blood pressure control with irbesartan. Statin therapy with atorvastatin.   Patient will need 30 day cardiac monitoring as outpatient. Patient will need 24 to 48 h more of inpatient monitoring for possible development of hydrocephalus or transtentorial herniation. Follow head CT in am.   Continue PT/OT, plan to transfer to inpatient rehab.   2. HTN/ dyslipidemia. Om rbesartan, for blood pressure control. Today is 125/72 mmHg.   Continue with atorvastatin.   3. Mild hypercalcemia. Clinically resolved, serum ca is 9,4 today. Renal function is stable with serum cr at 1.09.   4. Neuropathy. Continue with gabapentin.   5. Hx of breast cancer. Resumed oanastrazole.   6. Depression. On latuda and trazodone.   7. Chronic anemia. Follow as outpatient.    Status is: Inpatient  Remains inpatient appropriate because:Inpatient level of care appropriate due to severity of illness   Dispo: The patient is from: Home              Anticipated d/c is to: CIR              Anticipated d/c date is: 2 days              Patient currently is not medically stable to d/c.  DVT prophylaxis: Enoxaparin   Code Status:   full  Family Communication:  I spoke with patient's daughter at the bedside, we talked  in detail about patient's condition, plan of care and prognosis and all questions were addressed.     Consultants:   Neurology       Subjective: Patient with improvement of her symptoms but not yet back to baseline, continue to have dizziness on ambulation, no nausea or  vomiting, no dyspnea or chest pain.   Objective: Vitals:   06/16/20 2006 06/16/20 2323 06/17/20 0357 06/17/20 0820  BP: 108/72 108/64 120/67 125/72  Pulse: 71 69 62 81  Resp: 19 18 17 18   Temp: 99.3 F (37.4 C) 98.5 F (36.9 C) 98.4 F (36.9 C) 98.9 F (37.2 C)  TempSrc: Oral Oral Oral Oral  SpO2: 100% 97% 97% 98%  Weight:      Height:        Intake/Output Summary (Last 24 hours) at 06/17/2020 1035 Last data filed at 06/17/2020 0900 Gross per 24 hour  Intake 1655.9 ml  Output --  Net 1655.9 ml   Filed Weights   06/15/20 1406 06/16/20 0102  Weight: 73 kg 72.2 kg    Examination:   General: Not in pain or dyspnea, deconditioned Neurology: Awake and alert, non focal  E ENT: no pallor, no icterus, oral mucosa moist Cardiovascular: No JVD. S1-S2 present, rhythmic, no gallops, rubs, or murmurs. No lower extremity edema. Pulmonary: positive breath sounds bilaterally, Gastrointestinal. Abdomen soft and non tender Skin. No rashes Musculoskeletal: no joint deformities     Data Reviewed: I have personally reviewed following labs and imaging studies  CBC: Recent Labs  Lab 06/15/20 1929 06/16/20 0801  WBC 6.5 6.1  NEUTROABS 3.1  --   HGB 11.5* 10.6*  HCT 35.7* 33.0*  MCV 85.6 85.5  PLT 183 338   Basic Metabolic Panel: Recent Labs  Lab 06/15/20 1929 06/16/20 0801 06/17/20 0154  NA 137  --  139  K 4.1  --  3.8  CL 99  --  104  CO2 29  --  27  GLUCOSE 105*  --  112*  BUN 14  --  10  CREATININE 1.09* 1.19* 1.09*  CALCIUM 10.4*  --  9.4   GFR: Estimated Creatinine Clearance: 47.8 mL/min (A) (by C-G formula based on SCr of 1.09 mg/dL (H)). Liver Function Tests: Recent Labs  Lab 06/15/20 1929  AST 24  ALT 23  ALKPHOS 71  BILITOT 0.4  PROT 7.5  ALBUMIN 4.2   No results for input(s): LIPASE, AMYLASE in the last 168 hours. No results for input(s): AMMONIA in the last 168 hours. Coagulation Profile: Recent Labs  Lab 06/15/20 1929  INR 1.0   Cardiac  Enzymes: No results for input(s): CKTOTAL, CKMB, CKMBINDEX, TROPONINI in the last 168 hours. BNP (last 3 results) No results for input(s): PROBNP in the last 8760 hours. HbA1C: Recent Labs    06/16/20 0801  HGBA1C 5.9*   CBG: Recent Labs  Lab 06/15/20 1700  GLUCAP 122*   Lipid Profile: Recent Labs    06/16/20 0801  CHOL 144  HDL 41  LDLCALC 89  TRIG 68  CHOLHDL 3.5   Thyroid Function Tests: No results for input(s): TSH, T4TOTAL, FREET4, T3FREE, THYROIDAB in the last 72 hours. Anemia Panel: No results for input(s): VITAMINB12, FOLATE, FERRITIN, TIBC, IRON, RETICCTPCT in the last 72 hours.    Radiology Studies: I have reviewed all of the imaging during this hospital visit personally     Scheduled Meds: . aspirin EC  325 mg Oral Daily  . atorvastatin  40 mg Oral Daily  .  clopidogrel  75 mg Oral Daily  . enoxaparin (LOVENOX) injection  40 mg Subcutaneous Q24H  . gabapentin  700 mg Oral BID  . irbesartan  300 mg Oral Daily  . lurasidone  40 mg Oral QHS  . traZODone  50 mg Oral QHS   Continuous Infusions:   LOS: 1 day        Amron Guerrette Gerome Apley, MD

## 2020-06-17 NOTE — PMR Pre-admission (Signed)
PMR Admission Coordinator Pre-Admission Assessment   Patient: Kristi [name] is an 64 y.o., female MRN: [id number] DOB: 09/03/1956 Height: 5' 2" (157.5 cm) Weight: 72.2 kg                                                                                                                                                  Insurance Information HMO: Yes    PPO:       PCP:       IPA:       80/20:       OTHER:   PRIMARY: [employer] Medicare      Policy#: [id number]      Subscriber: Abri Salaam CM Name: Kathy      Phone#: [phone]     Fax#: [phone] Pre-Cert#: [id number] Kathy with [employer] provided auth for admit 8/13 through 8/19 with updates due to fax listed above on 8/19.    Employer: Disabled Benefits:  Phone #: [phone]     Name: Online [email or web address] portal Eff. Date: 01/05/20     Deduct: $0      Out of Pocket Max: $3600 (met $685)      Life Max: N/A  CIR: $295/day copay for days 1-5      SNF: $0 days 1-20; $184 days 21-40; $0 copay days 41-100 Outpatient: medical necessity     Co-Pay: $30/visit Home Health: 100%      Co-Pay: none DME: 80%     Co-Pay: 20% Providers: in network  SECONDARY:        Policy#:        Phone#:     Financial Counselor:        Phone#:     The "Data Collection Information Summary" for patients in Inpatient Rehabilitation Facilities with attached "Privacy Act Statement-Health Care Records" was provided and verbally reviewed with: Patient and Family   Emergency Contact Information         Contact Information     Name Relation Home Work Mobile    Magadan,Stephen Spouse [phone]   [phone]    Fryer,Shandra Daughter [phone]   [phone]       Current Medical History  Patient Admitting Diagnosis: L cerebellar infarct; B subacute cerebellar infarcts   History of Present Illness: A 64 y.o. right-handed female with history of anxiety, left breast cancer with radiation chemotherapy, hyperlipidemia, hypertension.  History taken from chart review and  patient. Patient lives with spouse.  Independent driving prior to admission.  Two-level home bed and bath on main level with 2 steps to entry.  She presented on 06/15/2020 with dizziness, gait abnormality, vertigo with nausea and mild to moderate headache.  Cranial CT scan showed area of low density in the inferior cerebellar hemisphere compatible with acute infarction.  No hemorrhage.  MRI identified acute subacute nonhemorrhagic infarct involving the inferomedial left cerebellum additional acute subacute   nonhemorrhagic posterior cortical infarct in the medial right cerebellum.  Patient did not receive TPA.  CT angiogram of head and neck high-grade stenosis at the origin of the left vertebral artery with occlusion less than 1 cm from the origin.  The vessel is reconstituted at the level of C6.  High-grade stenosis of the origin of the left external carotid artery.  Echocardiogram pending.  Admission chemistries alcohol negative, glucose 105, creatinine 1.09, SARS coronavirus negative, hemoglobin 10.6.  Currently maintained on aspirin and Plavix for CVA prophylaxis.  Subcutaneous Lovenox for DVT prophylaxis.  Therapy evaluation completed with recommendations of physical medicine rehab consult.   Complete NIHSS TOTAL: 1   Past Medical History      Past Medical History:  Diagnosis Date  . Anemia      Iron deficinecy anemia  . Anemia 05/14/2017  . Anxiety    . Anxiety and depression 05/15/2014  . Arthritis    . Breast cancer (HCC)      left ,last radiation 2'15, last chemo 8'14  . Constipation 11/13/2016  . Depression    . History of chicken pox    . History of radiation therapy 09/09/13-10/28/13    45 gray to left breast, lumpectomy cavity boosted to 63 gray  . HTN (hypertension) 11/13/2016  . Hyperglycemia 01/09/2016  . Hyperlipidemia 05/14/2017  . Hypertension    . MRSA (methicillin resistant Staphylococcus aureus) 2009    right groin area-no issues now. 04-07-14 PCR screen negative today.  . Neuropathy     . Preventative health care 11/13/2016      Family History  family history includes Alcohol abuse in her brother; Arthritis in her daughter; Breast cancer in her cousin and cousin; Breast cancer (age of onset: 30) in her paternal aunt; Cancer in her cousin, father, paternal uncle, and sister; Cervical cancer in her paternal aunt; Colon cancer in her paternal aunt; Dementia in her mother; Diabetes in her mother; Hypertension in her brother, father, and mother; Kidney cancer in her paternal grandmother; Lung cancer in her father; Memory loss in her mother; Ovarian cancer in her cousin; Seizures in her brother; Thyroid cancer in her father.   Prior Rehab/Hospitalizations:  Has the patient had prior rehab or hospitalizations prior to admission? No.  Patient did have outpatient PT 3 months ago for foot neuropathy.   Has the patient had major surgery during 100 days prior to admission? No   Current Medications    Current Facility-Administered Medications:  .  acetaminophen (TYLENOL) tablet 650 mg, 650 mg, Oral, Q4H PRN **OR** acetaminophen (TYLENOL) 160 MG/5ML solution 650 mg, 650 mg, Per Tube, Q4H PRN **OR** acetaminophen (TYLENOL) suppository 650 mg, 650 mg, Rectal, Q4H PRN, Kakrakandy, Arshad N, MD .  anastrozole (ARIMIDEX) tablet 1 mg, 1 mg, Oral, Daily, Heard, Courtney S, NP .  aspirin EC tablet 325 mg, 325 mg, Oral, Daily, Xu, Jindong, MD, 325 mg at 06/17/20 0955 .  atorvastatin (LIPITOR) tablet 40 mg, 40 mg, Oral, Daily, Xu, Jindong, MD, 40 mg at 06/17/20 0955 .  clopidogrel (PLAVIX) tablet 75 mg, 75 mg, Oral, Daily, Xu, Jindong, MD, 75 mg at 06/17/20 0955 .  enoxaparin (LOVENOX) injection 40 mg, 40 mg, Subcutaneous, Q24H, Kakrakandy, Arshad N, MD, 40 mg at 06/17/20 0610 .  gabapentin (NEURONTIN) capsule 700 mg, 700 mg, Oral, BID, Kakrakandy, Arshad N, MD, 700 mg at 06/17/20 0955 .  HYDROcodone-acetaminophen (NORCO) 10-325 MG per tablet 1 tablet, 1 tablet, Oral, Q6H PRN, Kakrakandy, Arshad N,  MD, 1 tablet at   06/17/20 0954 .  irbesartan (AVAPRO) tablet 300 mg, 300 mg, Oral, Daily, Kakrakandy, Arshad N, MD, 300 mg at 06/17/20 0955 .  LORazepam (ATIVAN) injection 0.5 mg, 0.5 mg, Intravenous, Once PRN, Rathore, Vasundhra, MD .  lurasidone (LATUDA) tablet 40 mg, 40 mg, Oral, QHS, Kakrakandy, Arshad N, MD, 40 mg at 06/16/20 2100 .  traZODone (DESYREL) tablet 50 mg, 50 mg, Oral, QHS, Kakrakandy, Arshad N, MD, 50 mg at 06/16/20 2100   Patients Current Diet:     Diet Order                      Diet Heart Room service appropriate? Yes; Fluid consistency: Thin  Diet effective now                      Precautions / Restrictions Precautions Precautions: Fall, Other (comment) Precaution Comments: severe central vestibular impairment Restrictions Weight Bearing Restrictions: No    Has the patient had 2 or more falls or a fall with injury in the past year?Yes   Prior Activity Level Community (5-7x/wk): Went out daily, was driving, is on disability   Prior Functional Level Prior Function Level of Independence: Independent Comments: independent and driving    Self Care: Did the patient need help bathing, dressing, using the toilet or eating?  Independent   Indoor Mobility: Did the patient need assistance with walking from room to room (with or without device)? Independent   Stairs: Did the patient need assistance with internal or external stairs (with or without device)? Independent   Functional Cognition: Did the patient need help planning regular tasks such as shopping or remembering to take medications? Independent   Home Assistive Devices / Equipment Home Assistive Devices/Equipment: None Home Equipment: Cane - single point   Prior Device Use: Indicate devices/aids used by the patient prior to current illness, exacerbation or injury? None   Current Functional Level Cognition   Arousal/Alertness: Lethargic Overall Cognitive Status: Impaired/Different from  baseline Orientation Level: Oriented to person General Comments: pt with flat affect, noted slow processing and poor problem solving  Attention: Focused Focused Attention: Appears intact Memory: Impaired Memory Impairment: Storage deficit, Retrieval deficit, Decreased short term memory Awareness: Impaired Awareness Impairment: Intellectual impairment Problem Solving: Impaired Problem Solving Impairment: Verbal complex Executive Function: Organizing Organizing: Impaired Organizing Impairment: Verbal complex Safety/Judgment: Appears intact    Extremity Assessment (includes Sensation/Coordination)   Upper Extremity Assessment: Overall WFL for tasks assessed  Lower Extremity Assessment: RLE deficits/detail, LLE deficits/detail RLE Deficits / Details: ankle dorsiflexors 5/5, quad 5/5, hip flexor 3+/5 RLE Sensation: decreased proprioception, history of peripheral neuropathy RLE Coordination: decreased gross motor LLE Deficits / Details: ankle dorsiflexors 5/5, quad 5/5, hip flexor 3+/5 LLE Sensation: decreased proprioception, history of peripheral neuropathy LLE Coordination: decreased gross motor     ADLs   Overall ADL's : Needs assistance/impaired Grooming: Min guard, Sitting Upper Body Bathing: Min guard, Sitting Lower Body Bathing: Sit to/from stand, Minimal assistance Upper Body Dressing : Min guard, Sitting Lower Body Dressing: Moderate assistance, Sit to/from stand Toilet Transfer: Minimal assistance, +2 for safety/equipment, Ambulation Functional mobility during ADLs: Minimal assistance, Moderate assistance, +2 for physical assistance, +2 for safety/equipment General ADL Comments: patient limited by dizziness, impaired balance       Mobility   Overal bed mobility: Needs Assistance Bed Mobility: Supine to Sit Supine to sit: Supervision, HOB elevated Sit to supine: Supervision, HOB elevated General bed mobility comments: supervision for safety        Transfers   Overall  transfer level: Needs assistance Transfers: Sit to/from Stand Sit to Stand: Min assist General transfer comment: initally unable to transition without loosing balance, constant cueing for focused gaze with min assist to power up and steady     Ambulation / Gait / Stairs / Wheelchair Mobility   Ambulation/Gait Ambulation/Gait assistance: Min assist, Mod assist Gait Distance (Feet): 16 Feet Assistive device: 2 person hand held assist Gait Pattern/deviations: Step-through pattern, Ataxic, Scissoring, Staggering left, Staggering right, Narrow base of support General Gait Details: very unsteady with gait, frequent scissoring and increased leaning to the left, poor proprioception noted as she had reduced awareness of gait pattern and how to correct. RPE 8/10. Gait velocity: decreased     Posture / Balance Dynamic Sitting Balance Sitting balance - Comments: min guard to close supervision but only within base of support Balance Overall balance assessment: Needs assistance Sitting-balance support: Feet supported Sitting balance-Leahy Scale: Fair Sitting balance - Comments: min guard to close supervision but only within base of support Standing balance support: Bilateral upper extremity supported, During functional activity Standing balance-Leahy Scale: Poor Standing balance comment: Min-ModAx2 to maintain safety and balance with gait     Special needs/care consideration Continuous Drip IV  Has saline lock and Designated visitor Daughter and husband likely        Previous Home Environment (from acute therapy documentation) Living Arrangements: Spouse/significant other  Lives With: Spouse Available Help at Discharge: Family Type of Home: House Home Layout: Two level, Bed/bath upstairs, Able to live on main level with bedroom/bathroom Alternate Level Stairs-Number of Steps: flight  Home Access: Stairs to enter Entrance Stairs-Number of Steps: 2 Bathroom Shower/Tub: Walk-in shower Bathroom  Toilet: Standard Home Care Services: No Additional Comments: shower is on 2nd floor    Discharge Living Setting Plans for Discharge Living Setting: Patient's home, House, Lives with (comment) (Lives with husband.) Type of Home at Discharge: House (Note husband planning move to 1 level apartment, selling hom) Discharge Home Layout: Two level, 1/2 bath on main level, Bed/bath upstairs Alternate Level Stairs-Number of Steps: 14 Discharge Home Access: Stairs to enter Entrance Stairs-Rails: None Entrance Stairs-Number of Steps: 2 steps Discharge Bathroom Shower/Tub: Walk-in shower, Door Discharge Bathroom Toilet: Handicapped height Discharge Bathroom Accessibility: Yes How Accessible: Accessible via walker (May have to turn walker sideways to get through door) Does the patient have any problems obtaining your medications?: No   Social/Family/Support Systems Patient Roles: Spouse, Parent (Has a husband, dtr, step son, sister-in-law) Contact Information: Stephen Feick - husband - [phone] Anticipated Caregiver: Husband, dtr, sister in law Anticipated Caregiver's Contact Information: Shandra Fryer - daughter - [phone] Ability/Limitations of Caregiver: Husband works PT, dtr and sister in law can assist. Caregiver Availability: 24/7 Discharge Plan Discussed with Primary Caregiver: Yes (Met with daughter and talked with spouse on phone) Is Caregiver In Agreement with Plan?: Yes Does Caregiver/Family have Issues with Lodging/Transportation while Pt is in Rehab?: No   Goals Patient/Family Goal for Rehab: PT S, OT Mod I/ S, SLP no goals Expected length of stay: 10-14 days Cultural Considerations: None Pt/Family Agrees to Admission and willing to participate: Yes Program Orientation Provided & Reviewed with Pt/Caregiver Including Roles  & Responsibilities: Yes   Decrease burden of Care through IP rehab admission: N/A   Possible need for SNF placement upon discharge: Not anticipated    Patient Condition: This patient's medical and functional status has changed since the consult dated: 8/11 in which the Rehabilitation Physician determined and documented that

## 2020-06-18 ENCOUNTER — Inpatient Hospital Stay (HOSPITAL_COMMUNITY): Payer: Medicare Other

## 2020-06-18 MED ORDER — POLYETHYLENE GLYCOL 3350 17 G PO PACK
17.0000 g | PACK | Freq: Every day | ORAL | Status: DC | PRN
  Administered 2020-06-18: 17 g via ORAL
  Filled 2020-06-18: qty 1

## 2020-06-18 MED ORDER — BISACODYL 5 MG PO TBEC
5.0000 mg | DELAYED_RELEASE_TABLET | Freq: Every day | ORAL | Status: DC | PRN
  Administered 2020-06-19: 5 mg via ORAL
  Filled 2020-06-18: qty 1

## 2020-06-18 NOTE — Progress Notes (Signed)
PROGRESS NOTE    Kristi [name]  NLZ:[id number] DOB: 15-Oct-1956 DOA: 06/15/2020 PCP: Mosie Lukes, MD    Brief Narrative:  Patient admitted to the hospital with acute/subacute nonhemorrhagic infarct involving the inferior medial left cerebellum/posterior cortical medial right cerebellum.  64 year old female who presented with dizziness. She does have the significant past medical history for breast cancer, hypertension, anemia, neuropathy and depression. Patient reported intermittent but persistent dizziness over the last 48 hours prior to hospitalization. On her initial physical examination blood pressure 101/51, heart rate 83, respiratory rate 23, temperature 98.6, oxygen saturation 98%,her lungswere clear to auscultation bilaterally, heart S1-S2, present rhythmic, soft abdomen, no lower extremity edema. Brain MRI with acute/subacute nonhemorrhagic infarct involving the inferior medial left cerebellum. Acute/subacute nonhemorrhagic posterior cortical infarct in the medial right cerebellum. EKG 57 bpm, normal axis, normal intervals, sinus rhythm, no ST segment T wave changes, positive LVH.  Further work up with CT angiography with high grade stenosis at the origin of the left vertebral artery with occlusion less than 1 dm from the origin. Positive reconstitution. Right vertebral artery is dominant. High grade stenosis at the origin of the left external carotid artery.   Patient has been placed on dual antiplatelet therapy with good toleration. Continue close neuro checks, for possible development of hydrocephalus or transtentorial herniation.   Follow up CT head today with evolving infarcts in the medial aspect of the cerebellar hemispheres bilaterally left greater than right similar to prior brain MRI.    Assessment & Plan:   Principal Problem:   Acute CVA (cerebrovascular accident) [facility]) Active Problems:   Primary cancer of upper inner quadrant of left female breast ([place])    HTN (hypertension)   Cerebrovascular accident (CVA) ([place])   Vertigo   Generalized anxiety disorder   Benign essential HTN   Dyslipidemia   1. Acute/ subacute nonhemmorrhagic infarct inferior medial left cerebellum and posterior cortical medial right cerebellum.  .  Patient with continue improvement in dizziness but not yet back to baseline. Follow up head CT with no worsening of edema.   Continue with dual antiplatelet therapy with asa and clopidogrel,(asa 325 mg daily and clopidogrel 75 mg daily for 3 months, then continue with aspirin alone) Continue with statin therapy.   Plan to transfer to CIR when neurologically stable, will need outpatient heart monitoring.   2. HTN/ dyslipidemia. Continue blood pressure control with irbesartan  Tolerating well atorvastatin.   3. Mild hypercalcemia. Clinically resolved,  4. Neuropathy. On gabapentin 700 mg bid and as needed hydrocodone.   5. Hx of breast cancer. continue with oanastrazole.   6. Depression. Continue with latuda and trazodone.   7. Chronic anemia. Stable, plan to follow as outpatient    Status is: Inpatient  Remains inpatient appropriate because:Inpatient level of care appropriate due to severity of illness   Dispo: The patient is from: Home              Anticipated d/c is to: CIR              Anticipated d/c date is: 1 day              Patient currently is not medically stable to d/c.   DVT prophylaxis: Enoxaparin   Code Status:   full  Family Communication:  I spoke with patient's son  at the bedside, we talked in detail about patient's condition, plan of care and prognosis and all questions were addressed.     Consultants:   Neurology  Subjective: Patient continue to improve dizziness but not yet back to baseline, no nausea or vomiting, no chest pain, this am is out of bed to chair.   Objective: Vitals:   06/17/20 2338 06/18/20 0335 06/18/20 0759 06/18/20 1205  BP: 110/62 128/65 123/72  127/69  Pulse: 64 61 73 62  Resp: 19 18 18 18   Temp: 98.4 F (36.9 C) 98.2 F (36.8 C) 99.3 F (37.4 C) 98.6 F (37 C)  TempSrc: Oral Oral Oral Oral  SpO2: 97% 97% 96% 98%  Weight:      Height:        Intake/Output Summary (Last 24 hours) at 06/18/2020 1231 Last data filed at 06/18/2020 0900 Gross per 24 hour  Intake 980 ml  Output --  Net 980 ml   Filed Weights   06/15/20 1406 06/16/20 0102  Weight: 73 kg 72.2 kg    Examination:   General: deconditioned  Neurology: Awake and alert, non focal  E ENT: no pallor, no icterus, oral mucosa moist Cardiovascular: No JVD. S1-S2 present, rhythmic, no gallops, rubs, or murmurs. No lower extremity edema. Pulmonary: positive breath sounds bilaterally, adequate air movement, no wheezing, rhonchi or rales. Gastrointestinal. Abdomen soft and non tender Skin. No rashes Musculoskeletal: no joint deformities     Data Reviewed: I have personally reviewed following labs and imaging studies  CBC: Recent Labs  Lab 06/15/20 1929 06/16/20 0801  WBC 6.5 6.1  NEUTROABS 3.1  --   HGB 11.5* 10.6*  HCT 35.7* 33.0*  MCV 85.6 85.5  PLT 183 322   Basic Metabolic Panel: Recent Labs  Lab 06/15/20 1929 06/16/20 0801 06/17/20 0154  NA 137  --  139  K 4.1  --  3.8  CL 99  --  104  CO2 29  --  27  GLUCOSE 105*  --  112*  BUN 14  --  10  CREATININE 1.09* 1.19* 1.09*  CALCIUM 10.4*  --  9.4   GFR: Estimated Creatinine Clearance: 47.8 mL/min (A) (by C-G formula based on SCr of 1.09 mg/dL (H)). Liver Function Tests: Recent Labs  Lab 06/15/20 1929  AST 24  ALT 23  ALKPHOS 71  BILITOT 0.4  PROT 7.5  ALBUMIN 4.2   No results for input(s): LIPASE, AMYLASE in the last 168 hours. No results for input(s): AMMONIA in the last 168 hours. Coagulation Profile: Recent Labs  Lab 06/15/20 1929  INR 1.0   Cardiac Enzymes: No results for input(s): CKTOTAL, CKMB, CKMBINDEX, TROPONINI in the last 168 hours. BNP (last 3 results) No  results for input(s): PROBNP in the last 8760 hours. HbA1C: Recent Labs    06/16/20 0801  HGBA1C 5.9*   CBG: Recent Labs  Lab 06/15/20 1700  GLUCAP 122*   Lipid Profile: Recent Labs    06/16/20 0801  CHOL 144  HDL 41  LDLCALC 89  TRIG 68  CHOLHDL 3.5   Thyroid Function Tests: No results for input(s): TSH, T4TOTAL, FREET4, T3FREE, THYROIDAB in the last 72 hours. Anemia Panel: No results for input(s): VITAMINB12, FOLATE, FERRITIN, TIBC, IRON, RETICCTPCT in the last 72 hours.    Radiology Studies: I have reviewed all of the imaging during this hospital visit personally     Scheduled Meds: . anastrozole  1 mg Oral Daily  . aspirin EC  325 mg Oral Daily  . atorvastatin  40 mg Oral Daily  . clopidogrel  75 mg Oral Daily  . enoxaparin (LOVENOX) injection  40 mg Subcutaneous Q24H  .  gabapentin  700 mg Oral BID  . irbesartan  300 mg Oral Daily  . lurasidone  40 mg Oral QHS  . traZODone  50 mg Oral QHS   Continuous Infusions:   LOS: 2 days        Woodrow Drab Gerome Apley, MD

## 2020-06-18 NOTE — Plan of Care (Signed)
  Problem: Activity: Goal: Risk for activity intolerance will decrease Outcome: Progressing   Problem: Coping: Goal: Level of anxiety will decrease Outcome: Progressing   Problem: Pain Managment: Goal: General experience of comfort will improve Outcome: Progressing   Problem: Safety: Goal: Ability to remain free from injury will improve Outcome: Progressing   Problem: Skin Integrity: Goal: Risk for impaired skin integrity will decrease Outcome: Progressing   Problem: Education: Goal: Knowledge of secondary prevention will improve Outcome: Progressing Goal: Knowledge of patient specific risk factors addressed and post discharge goals established will improve Outcome: Progressing Goal: Individualized Educational Video(s) Outcome: Progressing

## 2020-06-18 NOTE — H&P (Signed)
Physical Medicine and Rehabilitation Admission H&P    Chief Complaint  Patient presents with  . Dizziness  : HPI: Kristi [name] is a 64 year old right-handed female with history of anxiety, left breast cancer with radiation chemotherapy, hyperlipidemia and hypertension.  Per chart review lives with spouse independent prior to admission and driving.  Two-level home bed and bath on main level and 2 steps to entry.  Presented 06/15/2020 with dizziness and gait abnormality with vertigo nausea as well as mild to moderate headache.  Cranial CT scan showed area of low density in the inferior cerebellar hemisphere compatible with acute infarction.  No hemorrhage.  MRI identified acute subacute nonhemorrhagic infarction involving the inferior medial left cerebellum additional acute subacute nonhemorrhagic posterior cortical infarct in the medial right cerebellum.  Patient did not receive TPA.  CT angiogram of head and neck high-grade stenosis of the origin of the left vertebral artery with occlusion less than 1 cm from the origin.  The vessel was well reconstituted at the level of C6.  High-grade stenosis of the origin of the left external carotid artery.  Echocardiogram with ejection fraction of 61% grade 1 diastolic dysfunction no regional wall motion abnormalities.  Admission chemistries alcohol negative, glucose 105, creatinine 1.09, SARS coronavirus negative, hemoglobin 10.6.  Maintain on aspirin Plavix for CVA prophylaxis x3 months then aspirin alone.  Subcutaneous Lovenox for DVT prophylaxis.  Therapy evaluations completed and patient was admitted for a comprehensive rehab program.  Review of Systems  Constitutional: Negative for chills and fever.  HENT: Negative for hearing loss.   Eyes: Negative for blurred vision and double vision.  Respiratory: Negative for cough and shortness of breath.   Cardiovascular: Negative for chest pain, palpitations and leg swelling.  Gastrointestinal: Positive for  constipation and nausea.  Genitourinary: Negative for dysuria and hematuria.  Musculoskeletal: Positive for myalgias.  Skin: Negative for rash.  Neurological: Positive for dizziness and headaches.  Psychiatric/Behavioral: Positive for depression. The patient has insomnia.        Anxiety  All other systems reviewed and are negative.  Past Medical History:  Diagnosis Date  . Anemia    Iron deficinecy anemia  . Anemia 05/14/2017  . Anxiety   . Anxiety and depression 05/15/2014  . Arthritis   . Breast cancer ([place])    left ,last radiation 2'15, last chemo 8'14  . Constipation 11/13/2016  . Depression   . History of chicken pox   . History of radiation therapy 09/09/13-10/28/13   45 gray to left breast, lumpectomy cavity boosted to 63 gray  . HTN (hypertension) 11/13/2016  . Hyperglycemia 01/09/2016  . Hyperlipidemia 05/14/2017  . Hypertension   . MRSA (methicillin resistant Staphylococcus aureus) 2009   right groin area-no issues now. 04-07-14 PCR screen negative today.  . Neuropathy   . Preventative health care 11/13/2016   Past Surgical History:  Procedure Laterality Date  . ANAL SPHINCTEROTOMY  04/2011  . APPENDECTOMY  1980  . AXILLARY LYMPH NODE DISSECTION Left 02/04/2013   Procedure: LEFT AXILLARY LYMPH NODE DISSECTION;  Surgeon: Stark Klein, MD;  Location: [facility];  Service: General;  Laterality: Left;  End: 4431  . BREAST LUMPECTOMY WITH NEEDLE LOCALIZATION Left 02/04/2013   Procedure: LEFT BREAST LUMPECTOMY WITH NEEDLE LOCALIZATION;  Surgeon: Stark Klein, MD;  Location: [facility];  Service: General;  Laterality: Left;  . BREAST SURGERY     Lumpectomy in april 2014  . HEMORRHOID SURGERY  04/2011   ligation  . MASTECTOMY Left 02/15/2017  .  PORT-A-CATH REMOVAL N/A 04/16/2014   Procedure: REMOVAL PORT-A-CATH;  Surgeon: Stark Klein, MD;  Location: [facility];  Service: General;  Laterality: N/A;  . PORTACATH PLACEMENT Right 02/04/2013   Procedure: INSERTION PORT-A-CATH;  Surgeon: Stark Klein, MD;   Location: [facility];  Service: General;  Laterality: Right;  Start Time: 0960.  Marland Kitchen SHOULDER ARTHROSCOPY WITH ROTATOR CUFF REPAIR AND SUBACROMIAL DECOMPRESSION Left 02/24/2014   Procedure: SHOULDER ARTHROSCOPY WITH ROTATOR CUFF REPAIR AND SUBACROMIAL DECOMPRESSION;  Surgeon: Meredith Pel, MD;  Location: [facility];  Service: Orthopedics;  Laterality: Left;  LEFT SHOULDER DIAGNOSTIC OPERATIVE ARTHROSCOPY, SUBACROMIAL DECOMPRESSION, ROTATOR CUFF TEAR REPAIR  . SIMPLE MASTECTOMY WITH AXILLARY SENTINEL NODE BIOPSY Left 02/15/2017   Procedure: LEFT MASTECTOMY;  Surgeon: Stark Klein, MD;  Location: [facility];  Service: General;  Laterality: Left;  . TOTAL MASTECTOMY Right 12/26/2018   Procedure: RIGHT BREAST PROPHYLATIC MASTECTOMY;  Surgeon: Stark Klein, MD;  Location: [facility];  Service: General;  Laterality: Right;   Family History  Problem Relation Age of Onset  . Lung cancer Father   . Hypertension Father   . Thyroid cancer Father        dx in his 46s  . Cancer Father        lung, thyroid, smoker  . Breast cancer Paternal Aunt 52  . Colon cancer Paternal Aunt        dx in her 50x  . Cervical cancer Paternal Aunt        dzx in her 67s  . Ovarian cancer Cousin        dx in her lage 33s  . Breast cancer Cousin        maternal first cousin, once removed; dx in her late 39s  . Breast cancer Cousin        maternal first cousin once removed; dx in late 69s  . Hypertension Mother   . Diabetes Mother   . Dementia Mother   . Memory loss Mother   . Hypertension Brother   . Seizures Brother        Alcohol induced.  . Alcohol abuse Brother        drinker, smoker  . Cancer Paternal Uncle        oral cancer  . Kidney cancer Paternal Grandmother   . Arthritis Daughter        back surgery  . Cancer Cousin        several paternal cousins with brain cancer, leukemia, and other cancers  . Cancer Sister        stomach   Social History:  reports that she has never smoked. She has never used smokeless  tobacco. She reports that she does not drink alcohol and does not use drugs. Allergies: No Known Allergies Medications Prior to Admission  Medication Sig Dispense Refill  . anastrozole (ARIMIDEX) 1 MG tablet Take 1 tablet (1 mg total) by mouth daily. TAKE 1 TABLET (1 MG) BY MOUTH DAILY IN THE MORNING (Patient taking differently: Take 1 mg by mouth daily. ) 90 tablet 3  . Apoaequorin (PREVAGEN) 10 MG CAPS Take 1 capsule by mouth daily.    Marland Kitchen aspirin EC 81 MG tablet Take 1 tablet (81 mg total) by mouth daily.    Marland Kitchen gabapentin (NEURONTIN) 800 MG tablet Take 1 tablet (800 mg total) by mouth 3 (three) times daily. 90 tablet 3  . HYDROcodone-acetaminophen (NORCO) 10-325 MG tablet Take 1 tablet by mouth every 6 (six) hours as needed. (Patient taking differently: Take  1 tablet by mouth every 6 (six) hours as needed for moderate pain or severe pain. ) 180 tablet 0  . hydrOXYzine (ATARAX/VISTARIL) 50 MG tablet Take 50 mg by mouth 2 (two) times daily.    Marland Kitchen LATUDA 80 MG TABS tablet Take 0.5 tablets (40 mg total) by mouth at bedtime. 30 tablet 1  . Magnesium 250 MG TABS Take 1 tablet by mouth daily.    . Multiple Vitamin (MULTIVITAMIN WITH MINERALS) TABS tablet Take 1 tablet by mouth daily.    . traZODone (DESYREL) 50 MG tablet Take 50 mg by mouth at bedtime.     . valsartan-hydrochlorothiazide (DIOVAN-HCT) 320-25 MG tablet TAKE 1 TABLET BY MOUTH EVERY DAY IN THE MORNING (Patient taking differently: Take 1 tablet by mouth daily. ) 90 tablet 1  . KLOR-CON M20 20 MEQ tablet TAKE 1 TABLET BY MOUTH EVERY DAY (Patient not taking: Reported on 06/16/2020) 90 tablet 1    Drug Regimen Review Drug regimen was reviewed and remains appropriate with no significant issues identified  Home: Home Living Family/patient expects to be discharged to:: Private residence Living Arrangements: Spouse/significant other Available Help at Discharge: Family Type of Home: House Home Access: Stairs to enter Technical brewer of  Steps: 2 Home Layout: Two level, Bed/bath upstairs, Able to live on main level with bedroom/bathroom Alternate Level Stairs-Number of Steps: flight  Bathroom Shower/Tub: Multimedia programmer: Standard Home Equipment: Radio producer - single point Additional Comments: shower is on 2nd floor   Lives With: Spouse   Functional History: Prior Function Level of Independence: Independent Comments: independent and driving   Functional Status:  Mobility: Bed Mobility Overal bed mobility: Needs Assistance Bed Mobility: Supine to Sit Supine to sit: Supervision, HOB elevated Sit to supine: Supervision, HOB elevated General bed mobility comments: +rail, supervision for safety Transfers Overall transfer level: Needs assistance Equipment used: None Transfers: Sit to/from Stand Sit to Stand: Min assist General transfer comment: increased time, assist to power up and stabilize balance Ambulation/Gait Ambulation/Gait assistance: Mod assist, +2 safety/equipment Gait Distance (Feet): 175 Feet Assistive device: 1 person hand held assist Gait Pattern/deviations: Step-through pattern, Ataxic, Staggering left, Staggering right, Narrow base of support General Gait Details: multiple LOB requiring mod assist to recover. Slow, guarded gait. Very stiff at trunk/neck with limited to no head movement. Gait velocity: decreased Gait velocity interpretation: <1.8 ft/sec, indicate of risk for recurrent falls    ADL: ADL Overall ADL's : Needs assistance/impaired Grooming: Min guard, Sitting Upper Body Bathing: Min guard, Sitting Lower Body Bathing: Sit to/from stand, Minimal assistance Upper Body Dressing : Min guard, Sitting Lower Body Dressing: Moderate assistance, Sit to/from stand Toilet Transfer: Minimal assistance, +2 for safety/equipment, Ambulation Functional mobility during ADLs: Minimal assistance, Moderate assistance, +2 for physical assistance, +2 for safety/equipment General ADL Comments:  patient limited by dizziness, impaired balance    Cognition: Cognition Overall Cognitive Status: Impaired/Different from baseline Arousal/Alertness: Lethargic Orientation Level: Oriented X4 Attention: Focused Focused Attention: Appears intact Memory: Impaired Memory Impairment: Storage deficit, Retrieval deficit, Decreased short term memory Awareness: Impaired Awareness Impairment: Intellectual impairment Problem Solving: Impaired Problem Solving Impairment: Verbal complex Executive Function: Writer: Impaired Organizing Impairment: Verbal complex Safety/Judgment: Appears intact Cognition Arousal/Alertness: Awake/alert Behavior During Therapy: Flat affect Overall Cognitive Status: Impaired/Different from baseline Area of Impairment: Awareness, Problem solving Awareness: Emergent Problem Solving: Slow processing, Decreased initiation, Difficulty sequencing, Requires verbal cues General Comments: pt with flat affect, noted slow processing and poor problem solving   Physical Exam: Blood pressure 123/72,  pulse 73, temperature 99.3 F (37.4 C), temperature source Oral, resp. rate 18, height 5\' 2"  (1.575 m), weight 72.2 kg, last menstrual period 01/20/2013, SpO2 96 %. Physical Exam General: Alert and oriented x 3, No apparent distress HEENT: Head is normocephalic, atraumatic, PERRLA, EOMI, sclera anicteric, oral mucosa pink and moist, dentition intact, ext ear canals clear,  Neck: Supple without JVD or lymphadenopathy Heart: Reg rate and rhythm. No murmurs rubs or gallops Chest: CTA bilaterally without wheezes, rales, or rhonchi; no distress Abdomen: Soft, non-tender, non-distended, bowel sounds positive. Extremities: No clubbing, cyanosis, or edema. Pulses are 2+ Skin: Clean and intact without signs of breakdown Neuro: Patient is alert in no acute distress.  Follows commands.  Oriented x3.  Fair awareness of deficits. 5/5 strength throughout. No sensory deficit. FTN  mildly slowed bilaterally. Sensation intact Psych: Pt's affect is appropriate. Pt is cooperative   Results for orders placed or performed during the hospital encounter of 06/15/20 (from the past 48 hour(s))  Basic metabolic panel     Status: Abnormal   Collection Time: 06/17/20  1:54 AM  Result Value Ref Range   Sodium 139 135 - 145 mmol/L   Potassium 3.8 3.5 - 5.1 mmol/L   Chloride 104 98 - 111 mmol/L   CO2 27 22 - 32 mmol/L   Glucose, Bld 112 (H) 70 - 99 mg/dL    Comment: Glucose reference range applies only to samples taken after fasting for at least 8 hours.   BUN 10 8 - 23 mg/dL   Creatinine, Ser 1.09 (H) 0.44 - 1.00 mg/dL   Calcium 9.4 8.9 - 10.3 mg/dL   GFR calc non Af Amer 53 (L) >60 mL/min   GFR calc Af Amer >60 >60 mL/min   Anion gap 8 5 - 15    Comment: Performed at [facility] [address], [place], [place] [address]   CT HEAD WO CONTRAST  Result Date: 06/18/2020 CLINICAL DATA:  Follow-up left cerebellar infarct EXAM: CT HEAD WITHOUT CONTRAST TECHNIQUE: Contiguous axial images were obtained from the base of the skull through the vertex without intravenous contrast. COMPARISON:  06/16/2020, 06/15/2020 FINDINGS: Brain: Geographic area of decreased attenuation is noted in the medial aspect of the left cerebellar hemisphere with some localized mass effect on the fourth ventricle. No hemorrhagic component is noted. The area of abnormality in the medial aspect of the right cerebellar hemisphere on recent MRI is again identified as well. No new focal hemorrhage or infarct is identified. Vascular: No hyperdense vessel or unexpected calcification. Skull: Normal. Negative for fracture or focal lesion. Sinuses/Orbits: No acute finding. Other: None. IMPRESSION: Evolving infarcts in the medial aspect of the cerebellar hemispheres bilaterally left greater than right similar to that seen on recent MRI. Electronically Signed   By: Inez Catalina M.D.   On: 06/18/2020 08:44    ECHOCARDIOGRAM COMPLETE  Result Date: 06/16/2020    ECHOCARDIOGRAM REPORT   Patient Name:   BRAELEIGH PYPER Date of Exam: 06/16/2020 Medical Rec #:  [id number]      Height:       62.0 in Accession #:    [id number]     Weight:       159.2 lb Date of Birth:  Apr 06, 1956      BSA:          1.735 m Patient Age:    31 years       BP:           111/57 mmHg  Patient Gender: F              HR:           74 bpm. Exam Location:  Inpatient Procedure: 2D Echo, Color Doppler and Cardiac Doppler Indications:    Stroke 434.91  History:        Patient has prior history of Echocardiogram examinations, most                 recent 07/21/2013. Risk Factors:Hypertension, Dyslipidemia and                 Non-Smoker.  Sonographer:    Vickie Epley RDCS Referring Phys: [place]  Sonographer Comments: Image acquisition challenging due to mastectomy. IMPRESSIONS  1. Left ventricular ejection fraction, by estimation, is 60 to 65%. The left ventricle has normal function. The left ventricle has no regional wall motion abnormalities. Left ventricular diastolic parameters are consistent with Grade I diastolic dysfunction (impaired relaxation).  2. Right ventricular systolic function is normal. The right ventricular size is normal. Tricuspid regurgitation signal is inadequate for assessing PA pressure.  3. The mitral valve is normal in structure. No evidence of mitral valve regurgitation. No evidence of mitral stenosis.  4. The aortic valve is tricuspid. Aortic valve regurgitation is not visualized. No aortic stenosis is present.  5. The inferior vena cava is normal in size with greater than 50% respiratory variability, suggesting right atrial pressure of 3 mmHg. FINDINGS  Left Ventricle: Left ventricular ejection fraction, by estimation, is 60 to 65%. The left ventricle has normal function. The left ventricle has no regional wall motion abnormalities. The left ventricular internal cavity size was normal in size. There is  no left  ventricular hypertrophy. Left ventricular diastolic parameters are consistent with Grade I diastolic dysfunction (impaired relaxation). Right Ventricle: The right ventricular size is normal. No increase in right ventricular wall thickness. Right ventricular systolic function is normal. Tricuspid regurgitation signal is inadequate for assessing PA pressure. Left Atrium: Left atrial size was normal in size. Right Atrium: Right atrial size was normal in size. Pericardium: There is no evidence of pericardial effusion. Mitral Valve: The mitral valve is normal in structure. No evidence of mitral valve regurgitation. No evidence of mitral valve stenosis. Tricuspid Valve: The tricuspid valve is normal in structure. Tricuspid valve regurgitation is trivial. Aortic Valve: The aortic valve is tricuspid. Aortic valve regurgitation is not visualized. No aortic stenosis is present. Pulmonic Valve: The pulmonic valve was normal in structure. Pulmonic valve regurgitation is not visualized. Aorta: The aortic root is normal in size and structure. Venous: The inferior vena cava is normal in size with greater than 50% respiratory variability, suggesting right atrial pressure of 3 mmHg. IAS/Shunts: No atrial level shunt detected by color flow Doppler.  LEFT VENTRICLE PLAX 2D LVIDd:         4.80 cm     Diastology LVIDs:         3.70 cm     LV e' lateral:   11.70 cm/s LV PW:         0.50 cm     LV E/e' lateral: 6.7 LV IVS:        0.50 cm     LV e' medial:    7.94 cm/s LVOT diam:     1.80 cm     LV E/e' medial:  9.8 LV SV:         59 LV SV Index:   34 LVOT Area:  2.54 cm  LV Volumes (MOD) LV vol d, MOD A2C: 77.8 ml LV vol d, MOD A4C: 75.8 ml LV vol s, MOD A2C: 35.4 ml LV vol s, MOD A4C: 31.5 ml LV SV MOD A2C:     42.4 ml LV SV MOD A4C:     75.8 ml LV SV MOD BP:      41.4 ml RIGHT VENTRICLE RV S prime:     13.30 cm/s TAPSE (M-mode): 2.1 cm LEFT ATRIUM             Index       RIGHT ATRIUM          Index LA diam:        3.60 cm 2.08 cm/m   RA Area:     8.53 cm LA Vol (A2C):   29.1 ml 16.77 ml/m RA Volume:   15.60 ml 8.99 ml/m LA Vol (A4C):   24.2 ml 13.95 ml/m LA Biplane Vol: 29.4 ml 16.95 ml/m  AORTIC VALVE LVOT Vmax:   117.00 cm/s LVOT Vmean:  77.700 cm/s LVOT VTI:    0.231 m  AORTA Ao Root diam: 2.70 cm MITRAL VALVE MV Area (PHT): 4.39 cm    SHUNTS MV Decel Time: 173 msec    Systemic VTI:  0.23 m MV E velocity: 78.00 cm/s  Systemic Diam: 1.80 cm MV A velocity: 73.70 cm/s MV E/A ratio:  1.06 Loralie Champagne MD Electronically signed by Loralie Champagne MD Signature Date/Time: 06/16/2020/7:00:28 PM    Final        Medical Problem List and Plan: 1.  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  Antithrombotics: -DVT/anticoagulation: Lovenox- may discontinue as ambulating 175 feet.   -antiplatelet therapy: Aspirin 325 mg daily and Plavix 25 mg daily x3 months then aspirin alone 3. Pain Management: Neurontin 700 mg twice daily, hydrocodone as needed for chemotherapy-induced peripheral neuropathy 4. Mood: Trazodone 50 mg nightly provide emotional support  -antipsychotic agents: Latuda 40 mg nightly 5. Neuropsych: This patient is capable of making decisions on her own behalf. 6. Skin/Wound Care: Routine skin checks 7. Fluids/Electrolytes/Nutrition: Routine in and outs with follow-up chemistries 8.  Hypertension.  Avapro 300 mg daily.  Monitor with increased mobility. Very well controlled 9.  Hyperlipidemia.  Lipitor 10.  Left breast cancer.  Continue Arimidex daily  Cathlyn Parsons, PA-C 06/18/2020   I have personally performed a face to face diagnostic evaluation, including, but not limited to relevant history and physical exam findings, of this patient and developed relevant assessment and plan.  Additionally, I have reviewed and concur with the physician assistant's documentation  above.  Leeroy Cha, MD

## 2020-06-18 NOTE — Progress Notes (Signed)
Occupational Therapy Progress Note  Patient supervision level for bed mobility and min G assist with functional transfer to recliner, pt require min G assist for safety navigating around obstacles in room due to mild unsteadiness. Patient also requiring increased time for direction following during MMT testing seated EOB. Continue to recommend D/C to venue listed below to maximize patient safety and independence with daily routine.     06/18/20 1400  OT Visit Information  Last OT Received On 06/18/20  Assistance Needed +1  History of Present Illness 64yo female presenting to [facility] with dizziness. CTH shows acute L cerebellar infarct, MRI shows B subacute cerebellar infarcts. No tpa given. PMH hx breast CA s/p radiation and chemo therapies, HTN, HLD, neuropathy, L shoulder arthoscopy and rotator cuff repair  Precautions  Precautions Fall;Other (comment)  Precaution Comments central vestibular impairment  Pain Assessment  Pain Assessment No/denies pain  Cognition  Arousal/Alertness Awake/alert  Behavior During Therapy Flat affect  Overall Cognitive Status Impaired/Different from baseline  Problem Solving Slow processing  General Comments required cues for direction following during MMT  Upper Extremity Assessment  Upper Extremity Assessment RUE deficits/detail;LUE deficits/detail  RUE Deficits / Details MMT elbow 3/5  LUE Deficits / Details MMT grossly 4+/5  ADL  Overall ADL's  Needs assistance/impaired  Toilet Transfer Min guard;Ambulation  Toilet Transfer Details (indicate cue type and reason) to recliner, min guard for safety navigating around obstacles in room  Bed Mobility  Overal bed mobility Needs Assistance  Bed Mobility Supine to Sit  Supine to sit Supervision;HOB elevated  General bed mobility comments pt required supervision for safety  Balance  Overall balance assessment Needs assistance  Sitting-balance support Feet supported  Sitting balance-Leahy  Scale Fair  Standing balance support No upper extremity supported  Standing balance-Leahy Scale Fair  Standing balance comment min G dynamic balance for safety  Transfers  Overall transfer level Needs assistance  Equipment used None  Transfers Sit to/from Stand  Sit to Stand Supervision  General transfer comment supervision for sit to stand, min G for functional ambulation for safety due to mild unsteadiness with dynamic standing balance  OT - End of Session  Activity Tolerance Patient tolerated treatment well  Patient left in chair;with call bell/phone within reach;with family/visitor present  Nurse Communication Mobility status  OT Assessment/Plan  OT Plan Discharge plan remains appropriate  OT Visit Diagnosis Other abnormalities of gait and mobility (R26.89);Dizziness and giddiness (R42);Other symptoms and signs involving cognitive function  OT Frequency (ACUTE ONLY) Min 2X/week  Follow Up Recommendations CIR  OT Equipment 3 in 1 bedside commode  AM-PAC OT "6 Clicks" Daily Activity Outcome Measure (Version 2)  Help from another person eating meals? 3  Help from another person taking care of personal grooming? 3  Help from another person toileting, which includes using toliet, bedpan, or urinal? 3  Help from another person bathing (including washing, rinsing, drying)? 3  Help from another person to put on and taking off regular upper body clothing? 3  Help from another person to put on and taking off regular lower body clothing? 3  6 Click Score 18  OT Goal Progression  Progress towards OT goals Progressing toward goals  Acute Rehab OT Goals  Patient Stated Goal agreeable to up in chair with lunch  OT Goal Formulation With patient  Time For Goal Achievement 06/30/20  Potential to Achieve Goals Good  ADL Goals  Pt Will Perform Grooming standing;with supervision  Pt Will Perform Lower Body  Dressing with supervision;sit to/from stand  Pt Will Transfer to Toilet with  supervision;ambulating;bedside commode;stand pivot transfer  Pt Will Perform Toileting - Clothing Manipulation and hygiene with supervision;sit to/from stand;sitting/lateral leans  Additional ADL Goal #1 Patient will utilize gaze stabalization techniques during transfers and ADLs with supervision.  Additional ADL Goal #2 Patient will demonstrate anticipatory awareness during ADL routine to optimize independence and safety with ADL engagement and prevent falls.  OT Time Calculation  OT Start Time (ACUTE ONLY) 1236  OT Stop Time (ACUTE ONLY) 1248  OT Time Calculation (min) 12 min  OT General Charges  $OT Visit 1 Visit  OT Treatments  $Self Care/Home Management  8-22 mins   Delbert Phenix OT OT pager: [phone]

## 2020-06-18 NOTE — Progress Notes (Signed)
Inpatient Rehab Admissions Coordinator:   I have insurance authorization and a bed for this pt to admit to CIR on Saturday (8/14).  Dr. Cathlean Sauer in agreement.  Rehab MD (Dr. Ranell Patrick) to assess pt and confirm admission on Saturday.  Floor RN can call CIR at [phone] for report after 12pm on Saturday.  I have let pt/family and case manager know.    Shann Medal, PT, DPT Admissions Coordinator [phone] 06/18/20  4:22 PM

## 2020-06-18 NOTE — Progress Notes (Addendum)
I agree with the following treatment note after review of the documentation. This session was performed under the supervision of a licensed clinician.   Leighton Ruff, PT, DPT  Acute Rehabilitation Services  Pager: [phone]    Physical Therapy Treatment Patient Details Name: Kristi [name] MRN: [id number] DOB: 10/26/1956 Today's Date: 06/18/2020    History of Present Illness 64yo female presenting to [facility] with dizziness. CTH shows acute L cerebellar infarct, MRI shows B subacute cerebellar infarcts. No tpa given. PMH hx breast CA s/p radiation and chemo therapies, HTN, HLD, neuropathy, L shoulder arthoscopy and rotator cuff repair    PT Comments    Pt is progressing well towards goals, however, continues to exhibit unsteadiness, especially during dynamic gait tasks. Pt requiring min assist with dynamic gait tasks including backwards walking, horizontal/vertical head turns, and direction changes. Pt was noted to have multiple LOB with various dynamic mobility tasks, requiring external assist to regain balance. Continue to strongly recommend CIR level therapies. Pt would continue to benefit from acute therapy to increase her independence with functional activities. Will continue to follow acutely.    Follow Up Recommendations  CIR     Equipment Recommendations  Rolling walker with 5" wheels;3in1 (PT)    Recommendations for Other Services       Precautions / Restrictions Precautions Precautions: Fall;Other (comment) Precaution Comments: central vestibular impairment Restrictions Weight Bearing Restrictions: No    Mobility  Bed Mobility Overal bed mobility: Needs Assistance Bed Mobility: Supine to Sit     Supine to sit: Supervision;HOB elevated Sit to supine: Supervision;HOB elevated   General bed mobility comments: pt required supervision for safety  Transfers Overall transfer level: Needs assistance Equipment used: None Transfers: Sit to/from  Stand Sit to Stand: Min guard         General transfer comment: Min guard once standing for safety.   Ambulation/Gait Ambulation/Gait assistance: Min guard;Min assist Gait Distance (Feet): 100 Feet (X2) Assistive device: 1 person hand held assist;None Gait Pattern/deviations: Step-through pattern;Ataxic;Staggering left;Narrow base of support;Decreased step length - right;Decreased step length - left;Decreased stride length Gait velocity: decreased   General Gait Details: very, slow guard gait with Narrow BOS. Noted LOB x 3 and required min assist to regain balance. LOBs occurred to the Left and was noted during horizontal and vertical head turning with ambulation. Performed weaving tasks, and pt with unsteadiness required min A for steadying.    Stairs             Wheelchair Mobility    Modified Rankin (Stroke Patients Only) Modified Rankin (Stroke Patients Only) Pre-Morbid Rankin Score: No symptoms Modified Rankin: Moderately severe disability     Balance Overall balance assessment: Needs assistance Sitting-balance support: Feet supported Sitting balance-Leahy Scale: Fair Sitting balance - Comments: pt able to sit EOB without UE support   Standing balance support: During functional activity;No upper extremity supported Standing balance-Leahy Scale: Fair Standing balance comment: statically; dynamically required external support.     Tandem Stance - Right Leg: 15 (min A throughout) Tandem Stance - Left Leg: 15 (min A throughout )     High level balance activites: Backward walking;Head turns;Sudden stops;Turns High Level Balance Comments: Pt very guarded during backwards walking, no overt LOB noted, but did exhibit some unsteadiness.  Standardized Balance Assessment Standardized Balance Assessment : Dynamic Gait Index   Dynamic Gait Index Level Surface: Mild Impairment Gait with Horizontal Head Turns: Moderate Impairment Gait with Vertical Head Turns: Moderate  Impairment Gait and  Pivot Turn: Mild Impairment Step Over Obstacle: Mild Impairment Step Around Obstacles: Mild Impairment      Cognition Arousal/Alertness: Awake/alert Behavior During Therapy: Flat affect Overall Cognitive Status: Impaired/Different from baseline Area of Impairment: Awareness;Problem solving                           Awareness: Emergent Problem Solving: Slow processing;Decreased initiation;Difficulty sequencing;Requires verbal cues General Comments: pt with flat affect, noted slow processing and poor problem solving       Exercises Other Exercises Other Exercises: t completed standing star balance exercise with emphasis on coordinated LE movement with LOB x 2; required min A to regain balance. Pt required increased time for processing to move each LE during task.     General Comments        Pertinent Vitals/Pain Pain Assessment: No/denies pain    Home Living                      Prior Function            PT Goals (current goals can now be found in the care plan section) Acute Rehab PT Goals Patient Stated Goal: none stated PT Goal Formulation: With patient/family Time For Goal Achievement: 06/30/20 Potential to Achieve Goals: Good Progress towards PT goals: Progressing toward goals    Frequency    Min 4X/week      PT Plan Current plan remains appropriate    Co-evaluation              AM-PAC PT "6 Clicks" Mobility   Outcome Measure  Help needed turning from your back to your side while in a flat bed without using bedrails?: None Help needed moving from lying on your back to sitting on the side of a flat bed without using bedrails?: None Help needed moving to and from a bed to a chair (including a wheelchair)?: A Little Help needed standing up from a chair using your arms (e.g., wheelchair or bedside chair)?: A Little Help needed to walk in hospital room?: A Lot Help needed climbing 3-5 steps with a railing? : A  Lot 6 Click Score: 18    End of Session Equipment Utilized During Treatment: Gait belt Activity Tolerance: Patient tolerated treatment well Patient left: in chair;with call bell/phone within reach;with family/visitor present Nurse Communication: Mobility status PT Visit Diagnosis: Unsteadiness on feet (R26.81);Ataxic gait (R26.0);Other symptoms and signs involving the nervous system (R29.898);Difficulty in walking, not elsewhere classified (R26.2);Dizziness and giddiness (R42)     Time: 3382-5053 PT Time Calculation (min) (ACUTE ONLY): 20 min  Charges:  $Gait Training: 8-22 mins                     Gloriann Loan, [place]  Office: [phone]  06/18/2020, 1:59 PM

## 2020-06-18 NOTE — Progress Notes (Signed)
Inpatient Rehab Admissions Coordinator:   Met with pt at bedside to introduce self.  I will be following for my colleague Karene Fry, through Wednesday.    Shann Medal, PT, DPT Admissions Coordinator [phone] 06/18/20  2:12 PM

## 2020-06-18 NOTE — Progress Notes (Addendum)
STROKE TEAM PROGRESS NOTE   Interval History  Resting in bed on her phone. She reports no neurological decline and that her headache she noted yesterday has gone away. She inquires about when she will go to rehab and if she can wear her personal clothing in the rehab unit.   Objective  Temp:  [98.2 F (36.8 C)-99.3 F (37.4 C)] 98.6 F (37 C) (08/13 1205) Pulse Rate:  [61-73] 62 (08/13 1205) Cardiac Rhythm: Normal sinus rhythm (08/13 0823) Resp:  [17-19] 18 (08/13 1205) BP: (110-128)/(62-76) 127/69 (08/13 1205) SpO2:  [96 %-100 %] 98 % (08/13 1205)  Stroke Labs:  06/16/20 Lipid panel: Total cholesterol 144, Triglycerides 68, HDL 41, LDL 89 06/16/20 Hemoglobin A1C: 5.9  Pertinent Imaging:   I have personally reviewed the radiological images below and agree with the radiology interpretations.  06/15/20 CT Head WO Contrast  Area of low density in the inferior left cerebellar hemisphere compatible with acute infarction. Subtle decreased attenuation throughout much of the left temporal parietal and occipital lobes favored to be artifactual   06/16/20 CT Angio Head and Neck  1. High-grade stenosis at the origin of the left vertebral artery with occlusion less than 1 cm from the origin. The vessel is reconstituted at the level of C6. 2. The right vertebral artery is dominant. 3. High-grade stenosis at the origin of the left external carotid artery. 4. Minimal atherosclerotic changes at the right carotid bifurcation and cavernous internal carotid arteries bilaterally without significant stenosis. 5. Minimal vessel wall irregularity in the mid cervical internal carotid arteries bilaterally without significant stenosis. This may represent fibromuscular dysplasia. 6. Multilevel spondylosis of the cervical spine.  06/16/20 MRI Brain WO Contrast Acute/subacute non hemorrhagic infarct involving the inferomedial left cerebellum.Additional acute/subacute nonhemorrhagic posterior cortical infarct  in the medial right cerebellum. No acute or subacute supratentorial infarct.  06/16/20 Echocardiogram Complete  1. Left ventricular ejection fraction, by estimation, is 60 to 65%. The left ventricle has normal function. The left ventricle has no regional wall motion abnormalities. Left ventricular diastolic parameters are consistent with Grade I diastolic dysfunction (impaired relaxation).  2. Right ventricular systolic function is normal. The right ventricular size is normal.Tricuspid regurgitation signal is inadequate for assessing PA pressure.  3. The mitral valve is normal in structure. No evidence of mitral valve regurgitation. No evidence of mitral stenosis.  4. The aortic valve is tricuspid. Aortic valve regurgitation is not visualized. No aortic stenosis is present.  5. The inferior vena cava is normal in size with greater than 50% respiratory variability, suggesting right atrial pressure of 3 mmHg.   06/18/20 CT Head Evolving infarcts in the medial aspect of the cerebellar hemispheres bilaterally left greater than right similar to that seen on recent MRI.  Physical Examination  General: Well nourished, well developed in no apparent distress MS: AAOx4, follows commands  Speech: Fluent, naming and repetition intact, no dysarthria  Cranial nerves: EOMI, VFF, Face is symmetric, Sensation intact bilaterally to V1/V2/V3, shoulder shrug intact  Strength: 5/5 to all extremities Coordination: No ataxia noted w/FNF + HTS  Sensation: Intact to light touch throughout all extremities  Gait: Deferred  NIHSS 0  Assessment and Plan:  Kristi [name] is a 64 y.o. female w/pmh of breast cancer in remission, neuropathy, anxiety, depression, hypertension, hyperlipidemia who presents with gait imbalance, vertigo, nausea, vomiting and headache she was found to have bilateral cerebellar stroke. She was outside the time window for both IVTPA and thrombectomy given her LKW was 48 hours  prior to  admission.   NEURO #Moderate L PICA and Small Right PICA Stroke  #Left vertebral artery occlusion  At this time, stroke work up is complete. Her MRI was notable for bilateral PICA territory stroke with more stroke volume noted on the left in comparison to the right. Her CTA was significant for posterior circulation disease specifically high grade stenosis of the left vertebral artery with occlusion and reconstitution. No significant atherosclerosis elsewhere.   Stroke labs were done including Lipid panel w/LDL 89, Hemoglobin A1C 5.9. Echocardiogram showed relatively normal cardiac function w/EF 60-65 % with no intracardiac source of stroke. Given her CTA findings, it is likely that her stroke etiology is due to LAA in the setting of atherosclerosis however cardio embolic cause can not be excluded. She will need a 30 day cardiac event monitor at discharge. For secondary stroke prevention, she was started on DAPT and Atorvastatin 40 mg.  Given that her stroke burden is in the cerebellum with the potential to swell/cause hydrocephalus + transtentorial herniation close neurological monitoring was ensured and a repeat CTH was done 06/18/20 to evaluate stroke stability/swelling. Her [place] on 06/18/20 was stable with no swelling noted, all ventricles open on imaging. She is medically ready from a neurological standpoint to go to CIR.   - DAPT for 3 months then aspirin monotherapy (3 months given severe vertebral artery stenosis/occlusion) - Atorvastatin 40 mg for secondary stroke prevention  - Cardiac monitor at discharge to rule out atrial fibrillation   CARDS #HTN  Blood pressure is currently trending normotensive. At home she takes valsartan-hydrochlorothiazide 320-25 tablet once daily. She is currently on Irbesartan 300 mg QD and pressure look good on this. She is outside of the permissive HTN window given her sx started 48 hours prior to admission so she is essentially 5 days out from her stroke as of  06/18/20. Her long term blood pressure goal is < 140/90. - Continue Irbesartan 300 QD   #HLD  Atorvastatin 40 mg was started this admission. Her LDL was noted to be 89 goal LDL is <70 from a stroke prevention standpoint. Continue Atorvastatin 40 mg at discharge.   #Breast Cancer  She follows with oncology for breast cancer that is in remission and was last seen on 05/21/20. She was diagnosed with cancer of the left breast in 2014, subsequently treated with lumpectomy,adjuvant chemotherapy,radiation, bilateral mastectomies and is currently onantiestrogen therapy with anastrozole. Anastrozole was resumed this admission.   #Other Stroke Risk Factors Other stroke risk factors include advanced age.   Other Active Problems  Neuropathy  Anxiety  Depression  Hospital day # 2   Given neurological work up is complete with no further work up warranted, neurology is signing off at this time. Please do not hesitate to reach out Via [facility] with any questions.    Ruta Hinds, NP  [facility] Nurse Practitioner  Patient seen and discussed with attending physician Dr. Erlinda Hong  06/18/2020 12:27 PM  ATTENDING NOTE: I reviewed above note and agree with the assessment and plan. Pt was seen and examined.   Patient sitting in chair, no complaints, awake alert orientated.  Denies headache.  Repeat CT showed stable left PICA and small right cerebellar infarcts, no hydrocephalus.  PT/OT recommend CIR.  Continue aspirin and Plavix DAPT for 3 months and then aspirin alone.  Continue statin.  Recommend 30-day cardiac event monitoring as outpatient to rule out A. fib.  Neurology will sign off. Please call with questions. Pt will follow up with  Dr. Jannifer Franklin at [facility] in about 4 weeks. Thanks for the consult.   Rosalin Hawking, MD PhD Stroke Neurology 06/18/2020 12:27 PM     To contact Stroke Continuity provider, please refer to [email or web address] After hours, contact General Neurology

## 2020-06-19 ENCOUNTER — Encounter (HOSPITAL_COMMUNITY): Payer: Self-pay | Admitting: Physical Medicine & Rehabilitation

## 2020-06-19 ENCOUNTER — Other Ambulatory Visit: Payer: Self-pay

## 2020-06-19 ENCOUNTER — Inpatient Hospital Stay (HOSPITAL_COMMUNITY)
Admission: RE | Admit: 2020-06-19 | Discharge: 2020-06-22 | DRG: 057 | Disposition: A | Discharge status: Home / Self Care | Payer: Medicare Other | Source: Intra-hospital | Attending: Physical Medicine & Rehabilitation | Admitting: Physical Medicine & Rehabilitation

## 2020-06-19 DIAGNOSIS — Z808 Family history of malignant neoplasm of other organs or systems: Secondary | ICD-10-CM | POA: Diagnosis not present

## 2020-06-19 DIAGNOSIS — Z8249 Family history of ischemic heart disease and other diseases of the circulatory system: Secondary | ICD-10-CM

## 2020-06-19 DIAGNOSIS — Z79811 Long term (current) use of aromatase inhibitors: Secondary | ICD-10-CM

## 2020-06-19 DIAGNOSIS — Z833 Family history of diabetes mellitus: Secondary | ICD-10-CM

## 2020-06-19 DIAGNOSIS — Z9013 Acquired absence of bilateral breasts and nipples: Secondary | ICD-10-CM

## 2020-06-19 DIAGNOSIS — Z79899 Other long term (current) drug therapy: Secondary | ICD-10-CM | POA: Diagnosis not present

## 2020-06-19 DIAGNOSIS — Z7982 Long term (current) use of aspirin: Secondary | ICD-10-CM

## 2020-06-19 DIAGNOSIS — I10 Essential (primary) hypertension: Secondary | ICD-10-CM | POA: Diagnosis present

## 2020-06-19 DIAGNOSIS — F419 Anxiety disorder, unspecified: Secondary | ICD-10-CM | POA: Diagnosis not present

## 2020-06-19 DIAGNOSIS — Z803 Family history of malignant neoplasm of breast: Secondary | ICD-10-CM

## 2020-06-19 DIAGNOSIS — Z8673 Personal history of transient ischemic attack (TIA), and cerebral infarction without residual deficits: Secondary | ICD-10-CM

## 2020-06-19 DIAGNOSIS — I63542 Cerebral infarction due to unspecified occlusion or stenosis of left cerebellar artery: Secondary | ICD-10-CM | POA: Diagnosis not present

## 2020-06-19 DIAGNOSIS — Z8049 Family history of malignant neoplasm of other genital organs: Secondary | ICD-10-CM

## 2020-06-19 DIAGNOSIS — Z801 Family history of malignant neoplasm of trachea, bronchus and lung: Secondary | ICD-10-CM

## 2020-06-19 DIAGNOSIS — Z923 Personal history of irradiation: Secondary | ICD-10-CM

## 2020-06-19 DIAGNOSIS — I69393 Ataxia following cerebral infarction: Principal | ICD-10-CM

## 2020-06-19 DIAGNOSIS — I6300 Cerebral infarction due to thrombosis of unspecified precerebral artery: Secondary | ICD-10-CM

## 2020-06-19 DIAGNOSIS — Z8051 Family history of malignant neoplasm of kidney: Secondary | ICD-10-CM | POA: Diagnosis not present

## 2020-06-19 DIAGNOSIS — Z8041 Family history of malignant neoplasm of ovary: Secondary | ICD-10-CM | POA: Diagnosis not present

## 2020-06-19 DIAGNOSIS — T451X5A Adverse effect of antineoplastic and immunosuppressive drugs, initial encounter: Secondary | ICD-10-CM | POA: Diagnosis present

## 2020-06-19 DIAGNOSIS — Z7902 Long term (current) use of antithrombotics/antiplatelets: Secondary | ICD-10-CM

## 2020-06-19 DIAGNOSIS — Z853 Personal history of malignant neoplasm of breast: Secondary | ICD-10-CM

## 2020-06-19 DIAGNOSIS — Z811 Family history of alcohol abuse and dependence: Secondary | ICD-10-CM | POA: Diagnosis not present

## 2020-06-19 DIAGNOSIS — G620 Drug-induced polyneuropathy: Secondary | ICD-10-CM | POA: Diagnosis not present

## 2020-06-19 DIAGNOSIS — E785 Hyperlipidemia, unspecified: Secondary | ICD-10-CM | POA: Diagnosis not present

## 2020-06-19 DIAGNOSIS — Z800 Family history of malignant neoplasm of digestive organs: Secondary | ICD-10-CM | POA: Diagnosis not present

## 2020-06-19 HISTORY — DX: Personal history of transient ischemic attack (TIA), and cerebral infarction without residual deficits: Z86.73

## 2020-06-19 MED ORDER — ATORVASTATIN CALCIUM 40 MG PO TABS
40.0000 mg | ORAL_TABLET | Freq: Every day | ORAL | Status: DC
  Administered 2020-06-20 – 2020-06-22 (×3): 40 mg via ORAL
  Filled 2020-06-19 (×3): qty 1

## 2020-06-19 MED ORDER — ENOXAPARIN SODIUM 40 MG/0.4ML ~~LOC~~ SOLN: 40.0000 mg | SUBCUTANEOUS | Status: DC

## 2020-06-19 MED ORDER — HYDROCODONE-ACETAMINOPHEN 10-325 MG PO TABS
1.0000 | ORAL_TABLET | Freq: Four times a day (QID) | ORAL | Status: DC | PRN
  Administered 2020-06-19 – 2020-06-21 (×5): 1 via ORAL
  Filled 2020-06-19 (×5): qty 1

## 2020-06-19 MED ORDER — ANASTROZOLE 1 MG PO TABS
1.0000 mg | ORAL_TABLET | Freq: Every day | ORAL | Status: DC
  Administered 2020-06-20 – 2020-06-22 (×3): 1 mg via ORAL
  Filled 2020-06-19 (×3): qty 1

## 2020-06-19 MED ORDER — IRBESARTAN 300 MG PO TABS
300.0000 mg | ORAL_TABLET | Freq: Every day | ORAL | Status: DC
  Administered 2020-06-20 – 2020-06-22 (×3): 300 mg via ORAL
  Filled 2020-06-19 (×3): qty 1

## 2020-06-19 MED ORDER — ATORVASTATIN CALCIUM 40 MG PO TABS: 40.0000 mg | ORAL_TABLET | Freq: Every day | ORAL | 0 refills | Status: DC

## 2020-06-19 MED ORDER — CLOPIDOGREL BISULFATE 75 MG PO TABS: 75.0000 mg | ORAL_TABLET | Freq: Every day | ORAL | 0 refills | Status: DC

## 2020-06-19 MED ORDER — SORBITOL 70 % SOLN: 30.0000 mL | Freq: Every day | Status: DC | PRN

## 2020-06-19 MED ORDER — POLYETHYLENE GLYCOL 3350 17 G PO PACK: 17.0000 g | PACK | Freq: Every day | ORAL | 0 refills | Status: DC | PRN

## 2020-06-19 MED ORDER — LURASIDONE HCL 40 MG PO TABS
40.0000 mg | ORAL_TABLET | Freq: Every day | ORAL | Status: DC
  Administered 2020-06-19 – 2020-06-21 (×3): 40 mg via ORAL
  Filled 2020-06-19 (×4): qty 1

## 2020-06-19 MED ORDER — TRAZODONE HCL 50 MG PO TABS
50.0000 mg | ORAL_TABLET | Freq: Every day | ORAL | Status: DC
  Administered 2020-06-19 – 2020-06-21 (×3): 50 mg via ORAL
  Filled 2020-06-19 (×3): qty 1

## 2020-06-19 MED ORDER — ASPIRIN 325 MG PO TBEC: 325.0000 mg | DELAYED_RELEASE_TABLET | Freq: Every day | ORAL | 0 refills | Status: DC

## 2020-06-19 MED ORDER — GABAPENTIN 100 MG PO CAPS: 700.0000 mg | ORAL_CAPSULE | Freq: Two times a day (BID) | ORAL | 0 refills | Status: DC

## 2020-06-19 MED ORDER — ASPIRIN EC 325 MG PO TBEC
325.0000 mg | DELAYED_RELEASE_TABLET | Freq: Every day | ORAL | Status: DC
  Administered 2020-06-20 – 2020-06-22 (×3): 325 mg via ORAL
  Filled 2020-06-19 (×3): qty 1

## 2020-06-19 MED ORDER — CLOPIDOGREL BISULFATE 75 MG PO TABS
75.0000 mg | ORAL_TABLET | Freq: Every day | ORAL | Status: DC
  Administered 2020-06-20 – 2020-06-22 (×3): 75 mg via ORAL
  Filled 2020-06-19 (×3): qty 1

## 2020-06-19 MED ORDER — GABAPENTIN 400 MG PO CAPS
700.0000 mg | ORAL_CAPSULE | Freq: Two times a day (BID) | ORAL | Status: DC
  Administered 2020-06-19 – 2020-06-20 (×2): 700 mg via ORAL
  Filled 2020-06-19 (×2): qty 1

## 2020-06-19 NOTE — Discharge Summary (Signed)
Physician Discharge Summary  Kristi [name] EQA:[id number] DOB: Jul 26, 1956 DOA: 06/15/2020  PCP: Mosie Lukes, MD  Admit date: 06/15/2020 Discharge date: 06/19/2020  Admitted From: Home  Disposition:  CIR  Recommendations for Outpatient Follow-up and new medication changes:  1. Follow up with Dr. Charlett Blake in 7 days after discharge from inpatient rehab.  2. Follow up with neurology as outpatient as scheduled. 3. Patient has been placed on full dose aspirin 325 mg and clopidogrel 75 mg, to continue for 3 months and then continue aspirin alone.  4. Started on statin therapy with atorvastatin 40 mg daily.  5. Patient will need 30 day cardiac event monitoring as outpatient to rule out atrial fibrillation.   Home Health: na   Equipment/Devices: na    Discharge Condition: stable  CODE STATUS: full  Diet recommendation: heart healthy   Brief/Interim Summary: Patient admitted to the hospital with acute/subacute nonhemorrhagic infarct involving the inferior medial left cerebellum and posterior cortical medial right cerebellum.  64 year old female who presented with dizziness. She does have the significant past medical history for breast cancer, hypertension, anemia, neuropathy and depression. Patient reported intermittent but persistent dizziness over the last 48 hours prior to hospitalization. On her initial physical examination blood pressure 101/51, heart rate 83, respiratory rate 23, temperature 98.6, oxygen saturation 98%,her lungswere clear to auscultation bilaterally, heart S1-S2, present rhythmic, soft abdomen, no lower extremity edema. Sodium 137, potassium 4.1, chloride 99, bicarb 29, glucose 105, BUN 14, creatinine 1.0, white count 6.5, hemoglobin 9.5, hematocrit 35.7, platelets 183.  SARS COVID-19 negative. Head CT showed a low-density area in the inferior left cerebellar hemisphere compatible with acute infarction. Brain MRI with acute/subacute nonhemorrhagic infarct involving the  inferior medial left cerebellum. Acute/subacute nonhemorrhagic posterior cortical infarct in the medial right cerebellum. EKG 57 bpm, normal axis, normal intervals, sinus rhythm, no ST segment T wave changes, positive LVH.  Further work up withCT angiography showed high grade stenosis at the origin of the left vertebral artery with occlusion less than 1 cm from the origin. Positive reconstitution. Right vertebral artery is dominant. High grade stenosis at the origin of the left external carotid artery.  Patient was placed on dual antiplatelet therapy with good toleration. Continue close neuro checks, for possible development of hydrocephalus or transtentorial herniation.  Follow up CT head on 06/18/20 showed evolving infarcts in the medial aspect of the cerebellar hemispheres bilaterally left greater than right similar to prior brain MRI.   1.  Acute/subacute nonhemorrhagic infarct inferior medial left cerebellum and posterior cortical medial right cerebellum.  Patient was admitted to the medical ward, she was placed on a remote telemetry monitor.  She had frequent neuro checks, physical therapy, occupational therapy and neurology evaluations.  Patient will had further work-up with echocardiography which showed a preserved LV systolic function, 60 to 97% ejection fraction, no thrombotic or embolic phenomena.  Patient was placed on dual antiplatelet therapy along with statin, with good toleration. Because of location of acute/subacute CVA in the posterior fossa, a follow-up head CT was performed to rule out any development of hydrocephalus or transtentorial herniation.  Imaging was negative for this complications.  Her symptoms have been improving, she will be transferred to inpatient rehab continue physical/occupational therapy.  2.  Hypertension/dyslipidemia.  Continue blood pressure control with valsartan and hctz, continue atorvastatin. Her lipid profile showed LDL of 89, HDL 41,  cholesterol 144, triglycerides 68.  3.  Mild hypercalcemia.  Multifactorial, clinically resolved.  4.  Neuropathy.  Continue gabapentin.  Dose was reduced to 700 mg twice daily. Continue with as needed hydrocodone - acetaminophen.   5.  History of breast cancer.  Continue anastrozole.  Follow-up as an outpatient.  6.  Depression/ anxiety .  Continue Latuda, hydroxizine and trazodone.  7.  Chronic anemia. It remained stable, follow-up as an outpatient.  Discharge Diagnoses:  Principal Problem:   Acute CVA (cerebrovascular accident) [facility]) Active Problems:   Primary cancer of upper inner quadrant of left female breast ([place])   HTN (hypertension)   Cerebrovascular accident (CVA) ([place])   Vertigo   Generalized anxiety disorder   Benign essential HTN   Dyslipidemia    Discharge Instructions  Discharge Instructions    Ambulatory referral to Neurology   Complete by: As directed    Follow up with Dr. Jannifer Franklin at [facility] in 4 weeks.  Patient is Dr. Jannifer Franklin patient. Thanks.     Allergies as of 06/19/2020   No Known Allergies     Medication List    STOP taking these medications   gabapentin 800 MG tablet Commonly known as: Neurontin Replaced by: gabapentin 100 MG capsule   Klor-Con M20 20 MEQ tablet Generic drug: potassium chloride SA     TAKE these medications   anastrozole 1 MG tablet Commonly known as: ARIMIDEX Take 1 tablet (1 mg total) by mouth daily. TAKE 1 TABLET (1 MG) BY MOUTH DAILY IN THE MORNING What changed: additional instructions   aspirin 325 MG EC tablet Take 1 tablet (325 mg total) by mouth daily. What changed:   medication strength  how much to take   atorvastatin 40 MG tablet Commonly known as: LIPITOR Take 1 tablet (40 mg total) by mouth daily.   clopidogrel 75 MG tablet Commonly known as: PLAVIX Take 1 tablet (75 mg total) by mouth daily.   gabapentin 100 MG capsule Commonly known as: NEURONTIN Take 7 capsules (700 mg total) by mouth 2 (two)  times daily. Replaces: gabapentin 800 MG tablet   HYDROcodone-acetaminophen 10-325 MG tablet Commonly known as: Norco Take 1 tablet by mouth every 6 (six) hours as needed. What changed: reasons to take this   hydrOXYzine 50 MG tablet Commonly known as: ATARAX/VISTARIL Take 50 mg by mouth 2 (two) times daily.   Latuda 80 MG Tabs tablet Generic drug: lurasidone Take 0.5 tablets (40 mg total) by mouth at bedtime.   Magnesium 250 MG Tabs Take 1 tablet by mouth daily.   multivitamin with minerals Tabs tablet Take 1 tablet by mouth daily.   polyethylene glycol 17 g packet Commonly known as: MIRALAX / GLYCOLAX Take 17 g by mouth daily as needed for mild constipation.   Prevagen 10 MG Caps Generic drug: Apoaequorin Take 1 capsule by mouth daily.   traZODone 50 MG tablet Commonly known as: DESYREL Take 50 mg by mouth at bedtime.   valsartan-hydrochlorothiazide 320-25 MG tablet Commonly known as: DIOVAN-HCT TAKE 1 TABLET BY MOUTH EVERY DAY IN THE MORNING What changed: See the new instructions.       Follow-up Information    Kathrynn Ducking, MD. Schedule an appointment as soon as possible for a visit in 4 week(s).   Specialty: Neurology Contact information: [address] [place] [address] [phone]        Mosie Lukes, MD Follow up in 3 week(s).   Specialty: Family Medicine Contact information: [address] [place] [address] [phone]  No Known Allergies  Consultations:  Neurology   Inpatient rehab.    Procedures/Studies: CT ANGIO HEAD W OR WO CONTRAST  Result Date: 06/16/2020 CLINICAL DATA:  Cerebellar infarct.  Headache and dizziness. EXAM: CT ANGIOGRAPHY HEAD AND NECK TECHNIQUE: Multidetector CT imaging of the head and neck was performed using the standard protocol during bolus administration of intravenous contrast. Multiplanar CT image reconstructions and MIPs were obtained to evaluate  the vascular anatomy. Carotid stenosis measurements (when applicable) are obtained utilizing NASCET criteria, using the distal internal carotid diameter as the denominator. CONTRAST:  15mL OMNIPAQUE IOHEXOL 350 MG/ML SOLN COMPARISON:  CT head without contrast 06/15/2020. MR head without contrast 06/16/2020. FINDINGS: CT HEAD FINDINGS Brain: Acute/subacute nonhemorrhagic infarct is again noted in the inferomedial left cerebellum and posteromedial right cerebellum. No new infarcts are present. No acute hemorrhage is present. Supratentorial structures are within normal limits. Ventricles are normal. No significant extraaxial fluid collection is present. Vascular: Atherosclerotic changes are present within the cavernous internal carotid arteries bilaterally without a hyperdense vessel. Skull: Calvarium is intact. No focal lytic or blastic lesions are present. No significant extracranial soft tissue lesion is present. Sinuses: The paranasal sinuses and mastoid air cells are clear. Orbits: The globes and orbits are within normal limits. Review of the MIP images confirms the above findings CTA NECK FINDINGS Aortic arch: A 3 vessel arch configuration is present. Minimal atherosclerotic changes present without stenosis or aneurysm. Right carotid system: The right common carotid artery is within normal limits. Minimal irregularity is present at the bifurcation without significant stenosis. Subtle narrowing is present in the mid cervical right ICA without significant stenosis. Minimal vessel wall irregularity is present. Left carotid system: The left common carotid artery is within normal limits. The bifurcation is unremarkable. The proximal left ICA is normal. High-grade stenosis is present in the proximal left external carotid artery. There is some vessel wall irregularity in the mid cervical left ICA without significant stenosis. Vertebral arteries: The left vertebral artery is the dominant vessel. Scratched at the right  vertebral artery is the dominant vessel, originating from the subclavian artery. High-grade stenosis present at the origin of the left vertebral artery with occlusion less than 1 cm from the origin. The vessel is reconstituted at the level of C6. A hypoplastic left vertebral artery demonstrates no other significant stenoses or occlusion. No significant stenosis is present in the cervical right vertebral artery. Skeleton: Degenerative changes of the cervical spine are most evident at C4-5, C5-6, and C6-7 with uncovertebral spurring and reversal of the normal cervical lordosis. No focal lytic or blastic lesions are present. Vertebral body heights are maintained. Other neck: The soft tissues of the neck are otherwise within normal limits. Upper chest: Lung apices are clear. Thoracic inlet is within normal limits. Review of the MIP images confirms the above findings CTA HEAD FINDINGS Anterior circulation: Minimal atherosclerotic changes are present within the cavernous internal carotid arteries bilaterally without stenosis. Internal carotid arteries are otherwise within normal limits through the ICA termini. The A1 and M1 segments are normal. The anterior communicating artery is patent. MCA bifurcations are normal. ACA and MCA branch vessels are within normal limits. Posterior circulation: The right vertebral artery is the dominant vessel. PICA origins are visualized and normal. Hypoplastic V4 segment extends to the vertebrobasilar junction on the left. The basilar artery is normal. Both posterior cerebral arteries originate from the basilar tip. The PCA branch vessels are within normal limits. Venous sinuses: The dural sinuses are patent. The straight sinus  and deep cerebral veins are patent. The right transverse sinus is dominant. Cortical veins are unremarkable. Anatomic variants: None Review of the MIP images confirms the above findings IMPRESSION: 1. High-grade stenosis at the origin of the left vertebral artery  with occlusion less than 1 cm from the origin. The vessel is reconstituted at the level of C6. 2. The right vertebral artery is dominant. 3. High-grade stenosis at the origin of the left external carotid artery. 4. Minimal atherosclerotic changes at the right carotid bifurcation and cavernous internal carotid arteries bilaterally without significant stenosis. 5. Minimal vessel wall irregularity in the mid cervical internal carotid arteries bilaterally without significant stenosis. This may represent fibromuscular dysplasia. 6. Multilevel spondylosis of the cervical spine. Electronically Signed   By: San Morelle M.D.   On: 06/16/2020 06:58   CT HEAD WO CONTRAST  Result Date: 06/18/2020 CLINICAL DATA:  Follow-up left cerebellar infarct EXAM: CT HEAD WITHOUT CONTRAST TECHNIQUE: Contiguous axial images were obtained from the base of the skull through the vertex without intravenous contrast. COMPARISON:  06/16/2020, 06/15/2020 FINDINGS: Brain: Geographic area of decreased attenuation is noted in the medial aspect of the left cerebellar hemisphere with some localized mass effect on the fourth ventricle. No hemorrhagic component is noted. The area of abnormality in the medial aspect of the right cerebellar hemisphere on recent MRI is again identified as well. No new focal hemorrhage or infarct is identified. Vascular: No hyperdense vessel or unexpected calcification. Skull: Normal. Negative for fracture or focal lesion. Sinuses/Orbits: No acute finding. Other: None. IMPRESSION: Evolving infarcts in the medial aspect of the cerebellar hemispheres bilaterally left greater than right similar to that seen on recent MRI. Electronically Signed   By: Inez Catalina M.D.   On: 06/18/2020 08:44   CT Head Wo Contrast  Result Date: 06/15/2020 CLINICAL DATA:  Dizziness EXAM: CT HEAD WITHOUT CONTRAST TECHNIQUE: Contiguous axial images were obtained from the base of the skull through the vertex without intravenous  contrast. COMPARISON:  MRI 10/08/2019 FINDINGS: Brain: There is area of low-density noted in the inferior left cerebellar hemisphere compatible with acute infarct. Subtle low-density noted in the left temporal, parietal and occipital lobes which I favor is artifactual, but given the acute infarct in the left cerebellum, cannot completely exclude infarct. No hemorrhage or hydrocephalus. Vascular: No hyperdense vessel or unexpected calcification. Skull: No acute calvarial abnormality. Sinuses/Orbits: Visualized paranasal sinuses and mastoids clear. Orbital soft tissues unremarkable. Other: None IMPRESSION: Area of low-density in the inferior left cerebellar hemisphere compatible with acute infarction. Subtle decreased attenuation throughout much of the left temporal, parietal and occipital lobes which I favor is artifactual. No hemorrhage. Electronically Signed   By: Rolm Baptise M.D.   On: 06/15/2020 18:35   CT ANGIO NECK W OR WO CONTRAST  Result Date: 06/16/2020 CLINICAL DATA:  Cerebellar infarct.  Headache and dizziness. EXAM: CT ANGIOGRAPHY HEAD AND NECK TECHNIQUE: Multidetector CT imaging of the head and neck was performed using the standard protocol during bolus administration of intravenous contrast. Multiplanar CT image reconstructions and MIPs were obtained to evaluate the vascular anatomy. Carotid stenosis measurements (when applicable) are obtained utilizing NASCET criteria, using the distal internal carotid diameter as the denominator. CONTRAST:  60mL OMNIPAQUE IOHEXOL 350 MG/ML SOLN COMPARISON:  CT head without contrast 06/15/2020. MR head without contrast 06/16/2020. FINDINGS: CT HEAD FINDINGS Brain: Acute/subacute nonhemorrhagic infarct is again noted in the inferomedial left cerebellum and posteromedial right cerebellum. No new infarcts are present. No acute hemorrhage is present. Supratentorial structures  are within normal limits. Ventricles are normal. No significant extraaxial fluid collection  is present. Vascular: Atherosclerotic changes are present within the cavernous internal carotid arteries bilaterally without a hyperdense vessel. Skull: Calvarium is intact. No focal lytic or blastic lesions are present. No significant extracranial soft tissue lesion is present. Sinuses: The paranasal sinuses and mastoid air cells are clear. Orbits: The globes and orbits are within normal limits. Review of the MIP images confirms the above findings CTA NECK FINDINGS Aortic arch: A 3 vessel arch configuration is present. Minimal atherosclerotic changes present without stenosis or aneurysm. Right carotid system: The right common carotid artery is within normal limits. Minimal irregularity is present at the bifurcation without significant stenosis. Subtle narrowing is present in the mid cervical right ICA without significant stenosis. Minimal vessel wall irregularity is present. Left carotid system: The left common carotid artery is within normal limits. The bifurcation is unremarkable. The proximal left ICA is normal. High-grade stenosis is present in the proximal left external carotid artery. There is some vessel wall irregularity in the mid cervical left ICA without significant stenosis. Vertebral arteries: The left vertebral artery is the dominant vessel. Scratched at the right vertebral artery is the dominant vessel, originating from the subclavian artery. High-grade stenosis present at the origin of the left vertebral artery with occlusion less than 1 cm from the origin. The vessel is reconstituted at the level of C6. A hypoplastic left vertebral artery demonstrates no other significant stenoses or occlusion. No significant stenosis is present in the cervical right vertebral artery. Skeleton: Degenerative changes of the cervical spine are most evident at C4-5, C5-6, and C6-7 with uncovertebral spurring and reversal of the normal cervical lordosis. No focal lytic or blastic lesions are present. Vertebral body  heights are maintained. Other neck: The soft tissues of the neck are otherwise within normal limits. Upper chest: Lung apices are clear. Thoracic inlet is within normal limits. Review of the MIP images confirms the above findings CTA HEAD FINDINGS Anterior circulation: Minimal atherosclerotic changes are present within the cavernous internal carotid arteries bilaterally without stenosis. Internal carotid arteries are otherwise within normal limits through the ICA termini. The A1 and M1 segments are normal. The anterior communicating artery is patent. MCA bifurcations are normal. ACA and MCA branch vessels are within normal limits. Posterior circulation: The right vertebral artery is the dominant vessel. PICA origins are visualized and normal. Hypoplastic V4 segment extends to the vertebrobasilar junction on the left. The basilar artery is normal. Both posterior cerebral arteries originate from the basilar tip. The PCA branch vessels are within normal limits. Venous sinuses: The dural sinuses are patent. The straight sinus and deep cerebral veins are patent. The right transverse sinus is dominant. Cortical veins are unremarkable. Anatomic variants: None Review of the MIP images confirms the above findings IMPRESSION: 1. High-grade stenosis at the origin of the left vertebral artery with occlusion less than 1 cm from the origin. The vessel is reconstituted at the level of C6. 2. The right vertebral artery is dominant. 3. High-grade stenosis at the origin of the left external carotid artery. 4. Minimal atherosclerotic changes at the right carotid bifurcation and cavernous internal carotid arteries bilaterally without significant stenosis. 5. Minimal vessel wall irregularity in the mid cervical internal carotid arteries bilaterally without significant stenosis. This may represent fibromuscular dysplasia. 6. Multilevel spondylosis of the cervical spine. Electronically Signed   By: San Morelle M.D.   On:  06/16/2020 06:58   MR BRAIN WO CONTRAST  Result  Date: 06/16/2020 CLINICAL DATA:  Episode of dizziness and mild-to-moderate headache 2 days ago. Nausea. EXAM: MRI HEAD WITHOUT CONTRAST TECHNIQUE: Multiplanar, multiecho pulse sequences of the brain and surrounding structures were obtained without intravenous contrast. COMPARISON:  MR head without contrast scratched at CT head without contrast 06/15/2020. MR head without contrast 10/08/2019 FINDINGS: Brain: Acute/subacute nonhemorrhagic infarct is confirmed in the inferomedial left cerebellum. Additional posterior cortical infarct is present in the medial right cerebellum. Infarct involves the inferior vermis scattered more superior posterior infarcts. No acute or subacute supratentorial infarct is present. T2 signal changes are associated with the areas of acute/subacute infarct. No significant supratentorial white matter disease is present. The ventricles are of normal size. No significant extraaxial fluid collection is present. Vascular: Prominent vein extends into the superior vermis, likely DVA. Flow is present in the major intracranial arteries. No other venous abnormalities are present. Skull and upper cervical spine: Mild multilevel degenerative changes are present in the upper cervical spine, most prominent at C3-4. Marrow signal is normal. Vertebral body heights are normal. Craniocervical junction is normal. Midline structures are unremarkable. Sinuses/Orbits: The paranasal sinuses and mastoid air cells are clear. The globes and orbits are within normal limits. IMPRESSION: 1. Acute/subacute nonhemorrhagic infarct involving the inferomedial left cerebellum. 2. Additional acute/subacute nonhemorrhagic posterior cortical infarct in the medial right cerebellum. 3. No acute or subacute supratentorial infarct. Electronically Signed   By: San Morelle M.D.   On: 06/16/2020 06:44   ECHOCARDIOGRAM COMPLETE  Result Date: 06/16/2020    ECHOCARDIOGRAM  REPORT   Patient Name:   Kristi [name] Date of Exam: 06/16/2020 Medical Rec #:  [id number]      Height:       62.0 in Accession #:    [id number]     Weight:       159.2 lb Date of Birth:  [birth year]      BSA:          1.735 m Patient Age:    23 years       BP:           111/57 mmHg Patient Gender: F              HR:           74 bpm. Exam Location:  Inpatient Procedure: 2D Echo, Color Doppler and Cardiac Doppler Indications:    Stroke 434.91  History:        Patient has prior history of Echocardiogram examinations, most                 recent 07/21/2013. Risk Factors:Hypertension, Dyslipidemia and                 Non-Smoker.  Sonographer:    Vickie Epley RDCS Referring Phys: [place]  Sonographer Comments: Image acquisition challenging due to mastectomy. IMPRESSIONS  1. Left ventricular ejection fraction, by estimation, is 60 to 65%. The left ventricle has normal function. The left ventricle has no regional wall motion abnormalities. Left ventricular diastolic parameters are consistent with Grade I diastolic dysfunction (impaired relaxation).  2. Right ventricular systolic function is normal. The right ventricular size is normal. Tricuspid regurgitation signal is inadequate for assessing PA pressure.  3. The mitral valve is normal in structure. No evidence of mitral valve regurgitation. No evidence of mitral stenosis.  4. The aortic valve is tricuspid. Aortic valve regurgitation is not visualized. No aortic stenosis is present.  5. The inferior vena cava is normal in size  with greater than 50% respiratory variability, suggesting right atrial pressure of 3 mmHg. FINDINGS  Left Ventricle: Left ventricular ejection fraction, by estimation, is 60 to 65%. The left ventricle has normal function. The left ventricle has no regional wall motion abnormalities. The left ventricular internal cavity size was normal in size. There is  no left ventricular hypertrophy. Left ventricular diastolic parameters are  consistent with Grade I diastolic dysfunction (impaired relaxation). Right Ventricle: The right ventricular size is normal. No increase in right ventricular wall thickness. Right ventricular systolic function is normal. Tricuspid regurgitation signal is inadequate for assessing PA pressure. Left Atrium: Left atrial size was normal in size. Right Atrium: Right atrial size was normal in size. Pericardium: There is no evidence of pericardial effusion. Mitral Valve: The mitral valve is normal in structure. No evidence of mitral valve regurgitation. No evidence of mitral valve stenosis. Tricuspid Valve: The tricuspid valve is normal in structure. Tricuspid valve regurgitation is trivial. Aortic Valve: The aortic valve is tricuspid. Aortic valve regurgitation is not visualized. No aortic stenosis is present. Pulmonic Valve: The pulmonic valve was normal in structure. Pulmonic valve regurgitation is not visualized. Aorta: The aortic root is normal in size and structure. Venous: The inferior vena cava is normal in size with greater than 50% respiratory variability, suggesting right atrial pressure of 3 mmHg. IAS/Shunts: No atrial level shunt detected by color flow Doppler.  LEFT VENTRICLE PLAX 2D LVIDd:         4.80 cm     Diastology LVIDs:         3.70 cm     LV e' lateral:   11.70 cm/s LV PW:         0.50 cm     LV E/e' lateral: 6.7 LV IVS:        0.50 cm     LV e' medial:    7.94 cm/s LVOT diam:     1.80 cm     LV E/e' medial:  9.8 LV SV:         59 LV SV Index:   34 LVOT Area:     2.54 cm  LV Volumes (MOD) LV vol d, MOD A2C: 77.8 ml LV vol d, MOD A4C: 75.8 ml LV vol s, MOD A2C: 35.4 ml LV vol s, MOD A4C: 31.5 ml LV SV MOD A2C:     42.4 ml LV SV MOD A4C:     75.8 ml LV SV MOD BP:      41.4 ml RIGHT VENTRICLE RV S prime:     13.30 cm/s TAPSE (M-mode): 2.1 cm LEFT ATRIUM             Index       RIGHT ATRIUM          Index LA diam:        3.60 cm 2.08 cm/m  RA Area:     8.53 cm LA Vol (A2C):   29.1 ml 16.77 ml/m RA  Volume:   15.60 ml 8.99 ml/m LA Vol (A4C):   24.2 ml 13.95 ml/m LA Biplane Vol: 29.4 ml 16.95 ml/m  AORTIC VALVE LVOT Vmax:   117.00 cm/s LVOT Vmean:  77.700 cm/s LVOT VTI:    0.231 m  AORTA Ao Root diam: 2.70 cm MITRAL VALVE MV Area (PHT): 4.39 cm    SHUNTS MV Decel Time: 173 msec    Systemic VTI:  0.23 m MV E velocity: 78.00 cm/s  Systemic Diam: 1.80 cm MV A velocity: 73.70  cm/s MV E/A ratio:  1.06 Loralie Champagne MD Electronically signed by Loralie Champagne MD Signature Date/Time: 06/16/2020/7:00:28 PM    Final         Subjective: Patient is feeling better, her symptoms continue to improve, no nausea or vomiting, no chest pain or dyspnea.   Discharge Exam: Vitals:   06/19/20 0400 06/19/20 0746  BP:  120/69  Pulse: (!) 59 66  Resp: 19 16  Temp:  98.3 F (36.8 C)  SpO2: 96% 98%   Vitals:   06/19/20 0300 06/19/20 0337 06/19/20 0400 06/19/20 0746  BP:  112/61  120/69  Pulse: (!) 58 79 (!) 59 66  Resp: 12 17 19 16   Temp:  98.6 F (37 C)  98.3 F (36.8 C)  TempSrc:  Oral  Oral  SpO2: 97% 97% 96% 98%  Weight:      Height:        General: Not in pain or dyspnea.  Neurology: Awake and alert, preserved strength all 4 extremities.  E ENT: no pallor, no icterus, oral mucosa moist Cardiovascular: No JVD. S1-S2 present, rhythmic, no gallops, rubs, or murmurs. No lower extremity edema. Pulmonary: vesicular breath sounds bilaterally, adequate air movement, no wheezing, rhonchi or rales. Gastrointestinal. Abdomen soft and non tender Skin. No rashes Musculoskeletal: no joint deformities   The results of significant diagnostics from this hospitalization (including imaging, microbiology, ancillary and laboratory) are listed below for reference.     Microbiology: Recent Results (from the past 240 hour(s))  SARS Coronavirus 2 by RT PCR (hospital order, performed in [facility] hospital lab) Nasopharyngeal Nasopharyngeal Swab     Status: None   Collection Time: 06/15/20  7:29 PM    Specimen: Nasopharyngeal Swab  Result Value Ref Range Status   SARS Coronavirus 2 NEGATIVE NEGATIVE Final    Comment: (NOTE) SARS-CoV-2 target nucleic acids are NOT DETECTED.  The SARS-CoV-2 RNA is generally detectable in upper and lower respiratory specimens during the acute phase of infection. The lowest concentration of SARS-CoV-2 viral copies this assay can detect is 250 copies / mL. A negative result does not preclude SARS-CoV-2 infection and should not be used as the sole basis for treatment or other patient management decisions.  A negative result may occur with improper specimen collection / handling, submission of specimen other than nasopharyngeal swab, presence of viral mutation(s) within the areas targeted by this assay, and inadequate number of viral copies (<250 copies / mL). A negative result must be combined with clinical observations, patient history, and epidemiological information.  Fact Sheet for Patients:   StrictlyIdeas.no  Fact Sheet for Healthcare Providers: BankingDealers.co.za  This test is not yet approved or  cleared by the [place] FDA and has been authorized for detection and/or diagnosis of SARS-CoV-2 by FDA under an Emergency Use Authorization (EUA).  This EUA will remain in effect (meaning this test can be used) for the duration of the COVID-19 declaration under Section 564(b)(1) of the Act, 21 U.S.C. section 360bbb-3(b)(1), unless the authorization is terminated or revoked sooner.  Performed at [facility], [address] [place], [name] [address]      Labs: BNP (last 3 results) No results for input(s): BNP in the last 8760 hours. Basic Metabolic Panel: Recent Labs  Lab 06/15/20 1929 06/16/20 0801 06/17/20 0154  NA 137  --  139  K 4.1  --  3.8  CL 99  --  104  CO2 29  --  27  GLUCOSE 105*  --  112*  BUN 14  --  10  CREATININE 1.09* 1.19* 1.09*  CALCIUM 10.4*  --  9.4    Liver Function Tests: Recent Labs  Lab 06/15/20 1929  AST 24  ALT 23  ALKPHOS 71  BILITOT 0.4  PROT 7.5  ALBUMIN 4.2   No results for input(s): LIPASE, AMYLASE in the last 168 hours. No results for input(s): AMMONIA in the last 168 hours. CBC: Recent Labs  Lab 06/15/20 1929 06/16/20 0801  WBC 6.5 6.1  NEUTROABS 3.1  --   HGB 11.5* 10.6*  HCT 35.7* 33.0*  MCV 85.6 85.5  PLT 183 161   Cardiac Enzymes: No results for input(s): CKTOTAL, CKMB, CKMBINDEX, TROPONINI in the last 168 hours. BNP: Invalid input(s): POCBNP CBG: Recent Labs  Lab 06/15/20 1700  GLUCAP 122*   D-Dimer No results for input(s): DDIMER in the last 72 hours. Hgb A1c No results for input(s): HGBA1C in the last 72 hours. Lipid Profile No results for input(s): CHOL, HDL, LDLCALC, TRIG, CHOLHDL, LDLDIRECT in the last 72 hours. Thyroid function studies No results for input(s): TSH, T4TOTAL, T3FREE, THYROIDAB in the last 72 hours.  Invalid input(s): FREET3 Anemia work up No results for input(s): VITAMINB12, FOLATE, FERRITIN, TIBC, IRON, RETICCTPCT in the last 72 hours. Urinalysis    Component Value Date/Time   COLORURINE YELLOW 07/18/2013 1929   APPEARANCEUR CLEAR 07/18/2013 1929   LABSPEC 1.019 07/18/2013 1929   PHURINE 5.0 07/18/2013 1929   GLUCOSEU NEGATIVE 07/18/2013 1929   HGBUR NEGATIVE 07/18/2013 1929   BILIRUBINUR NEGATIVE 07/18/2013 1929   KETONESUR NEGATIVE 07/18/2013 1929   PROTEINUR NEGATIVE 07/18/2013 1929   UROBILINOGEN 0.2 07/18/2013 1929   NITRITE NEGATIVE 07/18/2013 1929   LEUKOCYTESUR SMALL (A) 07/18/2013 1929   Sepsis Labs Invalid input(s): PROCALCITONIN,  WBC,  LACTICIDVEN Microbiology Recent Results (from the past 240 hour(s))  SARS Coronavirus 2 by RT PCR (hospital order, performed in [place] hospital lab) Nasopharyngeal Nasopharyngeal Swab     Status: None   Collection Time: 06/15/20  7:29 PM   Specimen: Nasopharyngeal Swab  Result Value Ref Range Status    SARS Coronavirus 2 NEGATIVE NEGATIVE Final    Comment: (NOTE) SARS-CoV-2 target nucleic acids are NOT DETECTED.  The SARS-CoV-2 RNA is generally detectable in upper and lower respiratory specimens during the acute phase of infection. The lowest concentration of SARS-CoV-2 viral copies this assay can detect is 250 copies / mL. A negative result does not preclude SARS-CoV-2 infection and should not be used as the sole basis for treatment or other patient management decisions.  A negative result may occur with improper specimen collection / handling, submission of specimen other than nasopharyngeal swab, presence of viral mutation(s) within the areas targeted by this assay, and inadequate number of viral copies (<250 copies / mL). A negative result must be combined with clinical observations, patient history, and epidemiological information.  Fact Sheet for Patients:   StrictlyIdeas.no  Fact Sheet for Healthcare Providers: BankingDealers.co.za  This test is not yet approved or  cleared by the [place] FDA and has been authorized for detection and/or diagnosis of SARS-CoV-2 by FDA under an Emergency Use Authorization (EUA).  This EUA will remain in effect (meaning this test can be used) for the duration of the COVID-19 declaration under Section 564(b)(1) of the Act, 21 U.S.C. section 360bbb-3(b)(1), unless the authorization is terminated or revoked sooner.  Performed at [facility], [address], [place], [place] [address]  Time coordinating discharge: 45 minutes  SIGNED:   Tawni Millers, MD  [facility] 06/19/2020, 8:30 AM

## 2020-06-19 NOTE — Progress Notes (Signed)
Pt's personal belongings were packed and transported with pt via wheelchair to 4W16. Report was called and given to Janett Billow, Therapist, sports. Pt's IV was left in place as requested by nurse; site is clean, dry and intact.

## 2020-06-19 NOTE — H&P (Signed)
Physical Medicine and Rehabilitation Admission H&P  CC: Left cerebellar artery stroke  HPI: Kristi [name] is a 64 year old right-handed female with history of anxiety, left breast cancer with radiation chemotherapy, hyperlipidemia and hypertension.  Per chart review lives with spouse independent prior to admission and driving.  Two-level home bed and bath on main level and 2 steps to entry.  Presented 06/15/2020 with dizziness and gait abnormality with vertigo nausea as well as mild to moderate headache.  Cranial CT scan showed area of low density in the inferior cerebellar hemisphere compatible with acute infarction.  No hemorrhage.  MRI identified acute subacute nonhemorrhagic infarction involving the inferior medial left cerebellum additional acute subacute nonhemorrhagic posterior cortical infarct in the medial right cerebellum.  Patient did not receive TPA.  CT angiogram of head and neck high-grade stenosis of the origin of the left vertebral artery with occlusion less than 1 cm from the origin.  The vessel was well reconstituted at the level of C6.  High-grade stenosis of the origin of the left external carotid artery.  Echocardiogram with ejection fraction of 35% grade 1 diastolic dysfunction no regional wall motion abnormalities.  Admission chemistries alcohol negative, glucose 105, creatinine 1.09, SARS coronavirus negative, hemoglobin 10.6.  Maintain on aspirin Plavix for CVA prophylaxis x3 months then aspirin alone.  Subcutaneous Lovenox for DVT prophylaxis.  Therapy evaluations completed and patient was admitted for a comprehensive rehab program.  Review of Systems  Constitutional: Negative for chills and fever.  HENT: Negative for hearing loss.   Eyes: Negative for blurred vision and double vision.  Respiratory: Negative for cough and shortness of breath.   Cardiovascular: Negative for chest pain, palpitations and leg swelling.  Gastrointestinal: Positive for constipation and nausea.   Genitourinary: Negative for dysuria and hematuria.  Musculoskeletal: Positive for myalgias.  Skin: Negative for rash.  Neurological: Positive for dizziness and headaches.  Psychiatric/Behavioral: Positive for depression. The patient has insomnia.        Anxiety  All other systems reviewed and are negative.  Past Medical History:  Diagnosis Date  . Anemia    Iron deficinecy anemia  . Anemia 05/14/2017  . Anxiety   . Anxiety and depression 05/15/2014  . Arthritis   . Breast cancer ([place])    left ,last radiation 2'15, last chemo 8'14  . Constipation 11/13/2016  . Depression   . History of chicken pox   . History of radiation therapy 09/09/13-10/28/13   45 gray to left breast, lumpectomy cavity boosted to 63 gray  . HTN (hypertension) 11/13/2016  . Hyperglycemia 01/09/2016  . Hyperlipidemia 05/14/2017  . Hypertension   . MRSA (methicillin resistant Staphylococcus aureus) 2009   right groin area-no issues now. 04-07-14 PCR screen negative today.  . Neuropathy   . Preventative health care 11/13/2016   Past Surgical History:  Procedure Laterality Date  . ANAL SPHINCTEROTOMY  04/2011  . APPENDECTOMY  1980  . AXILLARY LYMPH NODE DISSECTION Left 02/04/2013   Procedure: LEFT AXILLARY LYMPH NODE DISSECTION;  Surgeon: Stark Klein, MD;  Location: [place];  Service: General;  Laterality: Left;  End: 3299  . BREAST LUMPECTOMY WITH NEEDLE LOCALIZATION Left 02/04/2013   Procedure: LEFT BREAST LUMPECTOMY WITH NEEDLE LOCALIZATION;  Surgeon: Stark Klein, MD;  Location: [place];  Service: General;  Laterality: Left;  . BREAST SURGERY     Lumpectomy in april 2014  . HEMORRHOID SURGERY  04/2011   ligation  . MASTECTOMY Left 02/15/2017  . PORT-A-CATH REMOVAL N/A 04/16/2014  Procedure: REMOVAL PORT-A-CATH;  Surgeon: Stark Klein, MD;  Location: [facility];  Service: General;  Laterality: N/A;  . PORTACATH PLACEMENT Right 02/04/2013   Procedure: INSERTION PORT-A-CATH;  Surgeon: Stark Klein, MD;  Location: [place];  Service:  General;  Laterality: Right;  Start Time: 6606.  Marland Kitchen SHOULDER ARTHROSCOPY WITH ROTATOR CUFF REPAIR AND SUBACROMIAL DECOMPRESSION Left 02/24/2014   Procedure: SHOULDER ARTHROSCOPY WITH ROTATOR CUFF REPAIR AND SUBACROMIAL DECOMPRESSION;  Surgeon: Meredith Pel, MD;  Location: [place];  Service: Orthopedics;  Laterality: Left;  LEFT SHOULDER DIAGNOSTIC OPERATIVE ARTHROSCOPY, SUBACROMIAL DECOMPRESSION, ROTATOR CUFF TEAR REPAIR  . SIMPLE MASTECTOMY WITH AXILLARY SENTINEL NODE BIOPSY Left 02/15/2017   Procedure: LEFT MASTECTOMY;  Surgeon: Stark Klein, MD;  Location: [place];  Service: General;  Laterality: Left;  . TOTAL MASTECTOMY Right 12/26/2018   Procedure: RIGHT BREAST PROPHYLATIC MASTECTOMY;  Surgeon: Stark Klein, MD;  Location: [place];  Service: General;  Laterality: Right;   Family History  Problem Relation Age of Onset  . Lung cancer Father   . Hypertension Father   . Thyroid cancer Father        dx in his 32s  . Cancer Father        lung, thyroid, smoker  . Breast cancer Paternal Aunt 5  . Colon cancer Paternal Aunt        dx in her 50x  . Cervical cancer Paternal Aunt        dzx in her 2s  . Ovarian cancer Cousin        dx in her lage 67s  . Breast cancer Cousin        maternal first cousin, once removed; dx in her late 68s  . Breast cancer Cousin        maternal first cousin once removed; dx in late 84s  . Hypertension Mother   . Diabetes Mother   . Dementia Mother   . Memory loss Mother   . Hypertension Brother   . Seizures Brother        Alcohol induced.  . Alcohol abuse Brother        drinker, smoker  . Cancer Paternal Uncle        oral cancer  . Kidney cancer Paternal Grandmother   . Arthritis Daughter        back surgery  . Cancer Cousin        several paternal cousins with brain cancer, leukemia, and other cancers  . Cancer Sister        stomach   Social History:  reports that she has never smoked. She has never used smokeless tobacco. She reports that she  does not drink alcohol and does not use drugs. Allergies: No Known Allergies Medications Prior to Admission  Medication Sig Dispense Refill  . anastrozole (ARIMIDEX) 1 MG tablet Take 1 tablet (1 mg total) by mouth daily. TAKE 1 TABLET (1 MG) BY MOUTH DAILY IN THE MORNING (Patient taking differently: Take 1 mg by mouth daily. ) 90 tablet 3  . Apoaequorin (PREVAGEN) 10 MG CAPS Take 1 capsule by mouth daily.    Marland Kitchen aspirin EC 325 MG EC tablet Take 1 tablet (325 mg total) by mouth daily. 30 tablet 0  . atorvastatin (LIPITOR) 40 MG tablet Take 1 tablet (40 mg total) by mouth daily. 30 tablet 0  . clopidogrel (PLAVIX) 75 MG tablet Take 1 tablet (75 mg total) by mouth daily. 30 tablet 0  . gabapentin (NEURONTIN) 100 MG capsule Take 7 capsules (  700 mg total) by mouth 2 (two) times daily.  0  . HYDROcodone-acetaminophen (NORCO) 10-325 MG tablet Take 1 tablet by mouth every 6 (six) hours as needed. (Patient taking differently: Take 1 tablet by mouth every 6 (six) hours as needed for moderate pain or severe pain. ) 180 tablet 0  . hydrOXYzine (ATARAX/VISTARIL) 50 MG tablet Take 50 mg by mouth 2 (two) times daily.    Marland Kitchen LATUDA 80 MG TABS tablet Take 0.5 tablets (40 mg total) by mouth at bedtime. 30 tablet 1  . Magnesium 250 MG TABS Take 1 tablet by mouth daily.    . Multiple Vitamin (MULTIVITAMIN WITH MINERALS) TABS tablet Take 1 tablet by mouth daily.    . polyethylene glycol (MIRALAX / GLYCOLAX) 17 g packet Take 17 g by mouth daily as needed for mild constipation. 14 each 0  . traZODone (DESYREL) 50 MG tablet Take 50 mg by mouth at bedtime.     . valsartan-hydrochlorothiazide (DIOVAN-HCT) 320-25 MG tablet TAKE 1 TABLET BY MOUTH EVERY DAY IN THE MORNING (Patient taking differently: Take 1 tablet by mouth daily. ) 90 tablet 1    Drug Regimen Review Drug regimen was reviewed and remains appropriate with no significant issues identified  Home: Home Living Family/patient expects to be discharged to:: Private  residence Living Arrangements: Spouse/significant other Available Help at Discharge: Family Type of Home: House Home Access: Stairs to enter Technical brewer of Steps: 2 Home Layout: Two level, Bed/bath upstairs, Able to live on main level with bedroom/bathroom Alternate Level Stairs-Number of Steps: flight  Bathroom Shower/Tub: Multimedia programmer: Standard Home Equipment: Radio producer - single point Additional Comments: shower is on 2nd floor   Lives With: Spouse   Functional History: Prior Function Level of Independence: Independent Comments: independent and driving   Functional Status:  Mobility: Bed Mobility Overal bed mobility: Needs Assistance Bed Mobility: Supine to Sit Supine to sit: Supervision, HOB elevated Sit to supine: Supervision, HOB elevated General bed mobility comments: +rail, supervision for safety Transfers Overall transfer level: Needs assistance Equipment used: None Transfers: Sit to/from Stand Sit to Stand: Min assist General transfer comment: increased time, assist to power up and stabilize balance Ambulation/Gait Ambulation/Gait assistance: Mod assist, +2 safety/equipment Gait Distance (Feet): 175 Feet Assistive device: 1 person hand held assist Gait Pattern/deviations: Step-through pattern, Ataxic, Staggering left, Staggering right, Narrow base of support General Gait Details: multiple LOB requiring mod assist to recover. Slow, guarded gait. Very stiff at trunk/neck with limited to no head movement. Gait velocity: decreased Gait velocity interpretation: <1.8 ft/sec, indicate of risk for recurrent falls  ADL: ADL Overall ADL's : Needs assistance/impaired Grooming: Min guard, Sitting Upper Body Bathing: Min guard, Sitting Lower Body Bathing: Sit to/from stand, Minimal assistance Upper Body Dressing : Min guard, Sitting Lower Body Dressing: Moderate assistance, Sit to/from stand Toilet Transfer: Minimal assistance, +2 for  safety/equipment, Ambulation Functional mobility during ADLs: Minimal assistance, Moderate assistance, +2 for physical assistance, +2 for safety/equipment General ADL Comments: patient limited by dizziness, impaired balance    Cognition: Cognition Overall Cognitive Status: Impaired/Different from baseline Arousal/Alertness: Lethargic Orientation Level: Oriented X4 Attention: Focused Focused Attention: Appears intact Memory: Impaired Memory Impairment: Storage deficit, Retrieval deficit, Decreased short term memory Awareness: Impaired Awareness Impairment: Intellectual impairment Problem Solving: Impaired Problem Solving Impairment: Verbal complex Executive Function: Writer: Impaired Organizing Impairment: Verbal complex Safety/Judgment: Appears intact Cognition Arousal/Alertness: Awake/alert Behavior During Therapy: Flat affect Overall Cognitive Status: Impaired/Different from baseline Area of Impairment: Awareness, Problem solving Awareness:  Emergent Problem Solving: Slow processing, Decreased initiation, Difficulty sequencing, Requires verbal cues General Comments: pt with flat affect, noted slow processing and poor problem solving    Physical Exam: Blood pressure 109/68, pulse 68, temperature 98.6 F (37 C), temperature source Oral, resp. rate 18, last menstrual period 01/20/2013, SpO2 98 %. Physical Exam General: Alert and oriented x 3, No apparent distress HEENT: Head is normocephalic, atraumatic, PERRLA, EOMI, sclera anicteric, oral mucosa pink and moist, dentition intact, ext ear canals clear,  Neck: Supple without JVD or lymphadenopathy Heart: Reg rate and rhythm. No murmurs rubs or gallops Chest: CTA bilaterally without wheezes, rales, or rhonchi; no distress Abdomen: Soft, non-tender, non-distended, bowel sounds positive. Extremities: No clubbing, cyanosis, or edema. Pulses are 2+ Skin: Clean and intact without signs of breakdown Neuro: Patient is  alert in no acute distress.  Follows commands.  Oriented x3.  Fair awareness of deficits. 5/5 strength throughout. No sensory deficit. FTN mildly slowed bilaterally. Sensation intact Psych: Pt's affect is appropriate. Pt is cooperative   No results found for this or any previous visit (from the past 48 hour(s)). CT HEAD WO CONTRAST  Result Date: 06/18/2020 CLINICAL DATA:  Follow-up left cerebellar infarct EXAM: CT HEAD WITHOUT CONTRAST TECHNIQUE: Contiguous axial images were obtained from the base of the skull through the vertex without intravenous contrast. COMPARISON:  06/16/2020, 06/15/2020 FINDINGS: Brain: Geographic area of decreased attenuation is noted in the medial aspect of the left cerebellar hemisphere with some localized mass effect on the fourth ventricle. No hemorrhagic component is noted. The area of abnormality in the medial aspect of the right cerebellar hemisphere on recent MRI is again identified as well. No new focal hemorrhage or infarct is identified. Vascular: No hyperdense vessel or unexpected calcification. Skull: Normal. Negative for fracture or focal lesion. Sinuses/Orbits: No acute finding. Other: None. IMPRESSION: Evolving infarcts in the medial aspect of the cerebellar hemispheres bilaterally left greater than right similar to that seen on recent MRI. Electronically Signed   By: Inez Catalina M.D.   On: 06/18/2020 08:44    Medical Problem List and Plan: 1.  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  Antithrombotics: -DVT/anticoagulation: Lovenox- may discontinue as ambulating 175 feet.   -antiplatelet therapy: Aspirin 325 mg daily and Plavix 25 mg daily x3 months then aspirin alone 3. Pain Management: Neurontin 700 mg twice daily, hydrocodone as needed for chemotherapy-induced peripheral neuropathy 4. Mood: Trazodone 50  mg nightly provide emotional support  -antipsychotic agents: Latuda 40 mg nightly 5. Neuropsych: This patient is capable of making decisions on her own behalf. 6. Skin/Wound Care: Routine skin checks 7. Fluids/Electrolytes/Nutrition: Routine in and outs with follow-up chemistries 8.  Hypertension.  Avapro 300 mg daily.  Monitor with increased mobility. Very well controlled 9.  Hyperlipidemia.  Lipitor 10.  Left breast cancer.  Continue Arimidex daily  Cathlyn Parsons, PA-C 06/18/2020   I have personally performed a face to face diagnostic evaluation, including, but not limited to relevant history and physical exam findings, of this patient and developed relevant assessment and plan.  Additionally, I have reviewed and concur with the physician assistant's documentation above.  The patient's status has not changed. The original post admission physician evaluation remains appropriate, and any changes from the pre-admission screening or documentation from the acute chart are noted above.   Leeroy Cha, MD

## 2020-06-19 NOTE — Progress Notes (Signed)
Physical Therapy Treatment Patient Details Name: Kristi [name] MRN: [id number] DOB: [birth year] Today's Date: 06/19/2020    History of Present Illness 64yo female presenting to [facility] with dizziness. CTH shows acute L cerebellar infarct, MRI shows B subacute cerebellar infarcts. No tpa given. PMH hx breast CA s/p radiation and chemo therapies, HTN, HLD, neuropathy, L shoulder arthoscopy and rotator cuff repair    PT Comments    Pt is progressing well towards goals. She required less assist with ambulation today with no overt LOB. Performed repeat sit<>stand 3 different ways, toe taps, and reaching in standing. Noted increased time when motor planning on the LLE and decreased trunk rotation with all activities. Pt remains an excellent candidate for CIR.    Follow Up Recommendations  CIR     Equipment Recommendations  Rolling walker with 5" wheels;3in1 (PT)    Recommendations for Other Services       Precautions / Restrictions Precautions Precautions: Fall;Other (comment) Precaution Comments: central vestibular impairment    Mobility  Bed Mobility Overal bed mobility: Needs Assistance Bed Mobility: Supine to Sit     Supine to sit: Supervision;HOB elevated     General bed mobility comments: pt required supervision for safety  Transfers Overall transfer level: Needs assistance Equipment used: None Transfers: Sit to/from Stand Sit to Stand: Supervision         General transfer comment: supervision for sit to stand  Ambulation/Gait Ambulation/Gait assistance: Min guard Gait Distance (Feet): 200 Feet Assistive device: None Gait Pattern/deviations: Step-through pattern;Ataxic;Staggering left;Narrow base of support;Decreased step length - right;Decreased step length - left;Decreased stride length Gait velocity: decreased   General Gait Details: Mildly ataxic gait. Very ridged upper body with little weight shift and no recipricoal arm swing. Pt able to  improve weight shift with cueing. No overt LOB noted this session.    Stairs             Wheelchair Mobility    Modified Rankin (Stroke Patients Only) Modified Rankin (Stroke Patients Only) Pre-Morbid Rankin Score: No symptoms Modified Rankin: Moderately severe disability     Balance Overall balance assessment: Needs assistance Sitting-balance support: Feet supported Sitting balance-Leahy Scale: Fair Sitting balance - Comments: pt able to sit EOB without UE support   Standing balance support: No upper extremity supported Standing balance-Leahy Scale: Good Standing balance comment: Pt able to perform dynamic balance activities w/o LOB. Min guard for safety.                            Cognition Arousal/Alertness: Awake/alert Behavior During Therapy: Flat affect Overall Cognitive Status: Impaired/Different from baseline Area of Impairment: Awareness;Problem solving                           Awareness: Emergent Problem Solving: Slow processing General Comments: Some difficulty following specific directions but able to self correct with time.       Exercises Other Exercises Other Exercises: Toe taps on 6in step, alternating  feet; then tapping each foot to L, R, or middle when cued. Pt required increased time with tapping R LE and needed cues to slow down and tap vs stomp. Other Exercises: sit<>stand 13x; 3x regular;  5x with LLE to the side; 5x with LLE forward. Pt able to perform w/o use of arm rests, though did use momentum as she fatigued.  Other Exercises: Reaching in standing to promote corssing midline.  Pt able to reach with BIL UE. Slow processing for reaching activity. Pt avoiding trunk rotation to reach.     General Comments General comments (skin integrity, edema, etc.): Pt ambulated to sink where she brushed her teeth and washed her face with supervision. One hand on counter for support.      Pertinent Vitals/Pain Pain Assessment:  No/denies pain    Home Living                      Prior Function            PT Goals (current goals can now be found in the care plan section) Acute Rehab PT Goals PT Goal Formulation: With patient/family Time For Goal Achievement: 06/30/20 Potential to Achieve Goals: Good Progress towards PT goals: Progressing toward goals    Frequency    Min 4X/week      PT Plan Current plan remains appropriate    Co-evaluation              AM-PAC PT "6 Clicks" Mobility   Outcome Measure  Help needed turning from your back to your side while in a flat bed without using bedrails?: None Help needed moving from lying on your back to sitting on the side of a flat bed without using bedrails?: None Help needed moving to and from a bed to a chair (including a wheelchair)?: A Little Help needed standing up from a chair using your arms (e.g., wheelchair or bedside chair)?: A Little Help needed to walk in hospital room?: A Lot Help needed climbing 3-5 steps with a railing? : A Lot 6 Click Score: 18    End of Session Equipment Utilized During Treatment: Gait belt Activity Tolerance: Patient tolerated treatment well Patient left: in chair;with call bell/phone within reach;with family/visitor present Nurse Communication: Mobility status PT Visit Diagnosis: Unsteadiness on feet (R26.81);Ataxic gait (R26.0);Other symptoms and signs involving the nervous system (R29.898);Difficulty in walking, not elsewhere classified (R26.2);Dizziness and giddiness (R42)     Time: 9798-9211 PT Time Calculation (min) (ACUTE ONLY): 26 min  Charges:  $Gait Training: 8-22 mins $Neuromuscular Re-education: 8-22 mins                    Benjiman Core, [place] Pager [phone] Acute Rehab  Allena Katz 06/19/2020, 11:12 AM

## 2020-06-20 ENCOUNTER — Inpatient Hospital Stay (HOSPITAL_COMMUNITY): Payer: Medicare Other | Admitting: Occupational Therapy

## 2020-06-20 ENCOUNTER — Inpatient Hospital Stay (HOSPITAL_COMMUNITY): Payer: Medicare Other

## 2020-06-20 DIAGNOSIS — I63542 Cerebral infarction due to unspecified occlusion or stenosis of left cerebellar artery: Secondary | ICD-10-CM

## 2020-06-20 MED ORDER — GABAPENTIN 600 MG PO TABS
300.0000 mg | ORAL_TABLET | Freq: Every day | ORAL | Status: DC
  Administered 2020-06-20: 300 mg via ORAL
  Filled 2020-06-20: qty 1

## 2020-06-20 MED ORDER — GABAPENTIN 600 MG PO TABS
600.0000 mg | ORAL_TABLET | Freq: Three times a day (TID) | ORAL | Status: DC
  Administered 2020-06-20 – 2020-06-21 (×3): 600 mg via ORAL
  Filled 2020-06-20 (×3): qty 1

## 2020-06-20 NOTE — Evaluation (Signed)
Occupational Therapy Assessment and Plan  Patient Details  Name: Kristi [name] MRN: [id number] Date of Birth: 01-12-1956  OT Diagnosis: abnormal posture, ataxia, blindness and low vision, disturbance of vision and muscle weakness (generalized) Rehab Potential:   ELOS: 2-3 days   Today's Date: 06/20/2020 OT Individual Time: 0240-9735 and 1415-1444 OT Individual Time Calculation (min): 58 min   And 29 min  Hospital Problem: Principal Problem:   Cerebral infarction involving left cerebellar artery ([place])   Past Medical History:  Past Medical History:  Diagnosis Date  . Anemia    Iron deficinecy anemia  . Anemia 05/14/2017  . Anxiety   . Anxiety and depression 05/15/2014  . Arthritis   . Breast cancer ([place])    left ,last radiation 2'15, last chemo 8'14  . Constipation 11/13/2016  . Depression   . History of chicken pox   . History of radiation therapy 09/09/13-10/28/13   45 gray to left breast, lumpectomy cavity boosted to 63 gray  . HTN (hypertension) 11/13/2016  . Hyperglycemia 01/09/2016  . Hyperlipidemia 05/14/2017  . Hypertension   . MRSA (methicillin resistant Staphylococcus aureus) 2009   right groin area-no issues now. 04-07-14 PCR screen negative today.  . Neuropathy   . Preventative health care 11/13/2016   Past Surgical History:  Past Surgical History:  Procedure Laterality Date  . ANAL SPHINCTEROTOMY  04/2011  . APPENDECTOMY  1980  . AXILLARY LYMPH NODE DISSECTION Left 02/04/2013   Procedure: LEFT AXILLARY LYMPH NODE DISSECTION;  Surgeon: Stark Klein, MD;  Location: [place];  Service: General;  Laterality: Left;  End: 3299  . BREAST LUMPECTOMY WITH NEEDLE LOCALIZATION Left 02/04/2013   Procedure: LEFT BREAST LUMPECTOMY WITH NEEDLE LOCALIZATION;  Surgeon: Stark Klein, MD;  Location: [place];  Service: General;  Laterality: Left;  . BREAST SURGERY     Lumpectomy in april 2014  . HEMORRHOID SURGERY  04/2011   ligation  . MASTECTOMY Left 02/15/2017  . PORT-A-CATH REMOVAL N/A  04/16/2014   Procedure: REMOVAL PORT-A-CATH;  Surgeon: Stark Klein, MD;  Location: [facility];  Service: General;  Laterality: N/A;  . PORTACATH PLACEMENT Right 02/04/2013   Procedure: INSERTION PORT-A-CATH;  Surgeon: Stark Klein, MD;  Location: [place];  Service: General;  Laterality: Right;  Start Time: 2426.  Marland Kitchen SHOULDER ARTHROSCOPY WITH ROTATOR CUFF REPAIR AND SUBACROMIAL DECOMPRESSION Left 02/24/2014   Procedure: SHOULDER ARTHROSCOPY WITH ROTATOR CUFF REPAIR AND SUBACROMIAL DECOMPRESSION;  Surgeon: Meredith Pel, MD;  Location: [place];  Service: Orthopedics;  Laterality: Left;  LEFT SHOULDER DIAGNOSTIC OPERATIVE ARTHROSCOPY, SUBACROMIAL DECOMPRESSION, ROTATOR CUFF TEAR REPAIR  . SIMPLE MASTECTOMY WITH AXILLARY SENTINEL NODE BIOPSY Left 02/15/2017   Procedure: LEFT MASTECTOMY;  Surgeon: Stark Klein, MD;  Location: [place];  Service: General;  Laterality: Left;  . TOTAL MASTECTOMY Right 12/26/2018   Procedure: RIGHT BREAST PROPHYLATIC MASTECTOMY;  Surgeon: Stark Klein, MD;  Location: [place];  Service: General;  Laterality: Right;    Assessment & Plan Clinical Impression: Kristi [name] is a 64 year old right-handed female with history of anxiety, left breast cancer with radiation chemotherapy, hyperlipidemia and hypertension.  Per chart review lives with spouse independent prior to admission and driving.  Two-level home bed and bath on main level and 2 steps to entry.  Presented 06/15/2020 with dizziness and gait abnormality with vertigo nausea as well as mild to moderate headache.  Cranial CT scan showed area of low density in the inferior cerebellar hemisphere compatible with acute infarction.  No hemorrhage.  MRI identified  acute subacute nonhemorrhagic infarction involving the inferior medial left cerebellum additional acute subacute nonhemorrhagic posterior cortical infarct in the medial right cerebellum.  Patient did not receive TPA.  CT angiogram of head and neck high-grade stenosis of the origin of  the left vertebral artery with occlusion less than 1 cm from the origin.  The vessel was well reconstituted at the level of C6.  High-grade stenosis of the origin of the left external carotid artery.  Echocardiogram with ejection fraction of 01% grade 1 diastolic dysfunction no regional wall motion abnormalities.  Admission chemistries alcohol negative, glucose 105, creatinine 1.09, SARS coronavirus negative, hemoglobin 10.6.  Maintain on aspirin Plavix for CVA prophylaxis x3 months then aspirin alone.  Subcutaneous Lovenox for DVT prophylaxis.  Therapy evaluations completed and patient was admitted for a comprehensive rehab program. Patient transferred to CIR on 06/19/2020 .    Patient currently requires supervision with basic self-care skills secondary to muscle weakness, decreased visual acuity and decreased visual perceptual skills and decreased standing balance and decreased balance strategies.  Prior to hospitalization, patient could complete BADLs and some IADLs with independent .  Patient will benefit from skilled intervention to increase independence with basic self-care skills prior to discharge home with care partner.  Anticipate patient will require intermittent supervision and follow up outpatient.  OT - End of Session Activity Tolerance: Tolerates 30+ min activity without fatigue Endurance Deficit: No OT Assessment OT Barriers to Discharge: Other (comments) (none) OT Patient demonstrates impairments in the following area(s): Balance;Sensory;Vision OT Basic ADL's Functional Problem(s): Bathing;Dressing;Toileting OT Transfers Functional Problem(s): Toilet OT Plan OT Intensity: Minimum of 1-2 x/day, 45 to 90 minutes OT Frequency: 5 out of 7 days OT Duration/Estimated Length of Stay: 2-3 days OT Treatment/Interventions: Balance/vestibular training;Discharge planning;Self Care/advanced ADL retraining;Therapeutic Activities;UE/LE Coordination activities;Visual/perceptual  remediation/compensation;Therapeutic Exercise;Patient/family education;Functional mobility training;Disease mangement/prevention;Cognitive remediation/compensation;Community reintegration;DME/adaptive equipment instruction;Neuromuscular re-education;UE/LE Strength taining/ROM;Psychosocial support OT Self Feeding Anticipated Outcome(s): Independent OT Basic Self-Care Anticipated Outcome(s): Mod I -  Supervision OT Toileting Anticipated Outcome(s): Mod I OT Bathroom Transfers Anticipated Outcome(s): Supervision OT Recommendation Patient destination: Home Follow Up Recommendations: Outpatient OT Equipment Recommended: Other (comment) Equipment Details: shower seat   OT Evaluation Precautions/Restrictions  Precautions Precautions: Fall Restrictions Weight Bearing Restrictions: No Pain Pain Assessment Pain Scale: 0-10 Pain Score: 0-No pain Home Living/Prior Functioning Home Living Family/patient expects to be discharged to:: Private residence Living Arrangements: Spouse/significant other Available Help at Discharge: Family, Available 24 hours/day Type of Home: House Home Access: Stairs to enter Technical brewer of Steps: 2 Entrance Stairs-Rails: None Home Layout: Two level, Bed/bath upstairs, 1/2 bath on main level Alternate Level Stairs-Number of Steps: 14 Alternate Level Stairs-Rails: Left Bathroom Shower/Tub: Multimedia programmer: Standard Bathroom Accessibility: Yes Additional Comments: shower/primary bathroom is on 2nd floor  Lives With: Spouse IADL History Occupation: Retired Prior Function Level of Independence: Independent with basic ADLs, Independent with homemaking with ambulation, Independent with gait, Independent with transfers  Able to Take Stairs?: Yes Driving: Yes Vocation: On disability Comments: Completed OPPT 3 months ago for falls/balance due to peripheral neuropathy s/p treatment for breast CA Vision Baseline Vision/History: Wears  glasses Wears Glasses: At all times Patient Visual Report: No change from baseline Vision Assessment?: Yes Eye Alignment: Within Functional Limits Ocular Range of Motion: Within Functional Limits Alignment/Gaze Preference: Within Defined Limits Tracking/Visual Pursuits: Decreased smoothness of horizontal tracking Saccades: Additional eye shifts occurred during testing;Overshoots Convergence: Impaired (comment) Perception  Perception: Within Functional Limits Praxis Praxis: Intact Cognition Overall Cognitive Status: Within Functional Limits  for tasks assessed Arousal/Alertness: Awake/alert Orientation Level: Person;Place;Situation Person: Oriented Place: Oriented Situation: Oriented Year: 2021 Month: August Day of Week: Correct Memory: Impaired (mildly) Memory Impairment: Decreased short term memory Immediate Memory Recall: Sock;Blue;Bed Memory Recall Sock: Without Cue Memory Recall Blue: Without Cue Memory Recall Bed: With Cue Focused Attention: Appears intact Awareness: Appears intact Problem Solving: Appears intact Organizing: Appears intact Safety/Judgment: Appears intact Sensation Sensation Light Touch: Appears Intact Coordination Gross Motor Movements are Fluid and Coordinated: No Fine Motor Movements are Fluid and Coordinated: Yes Coordination and Movement Description: mild ataxic gait to/from bathroom Heel Shin Test: [facility] 9 Hole Peg Test: 22 seconds R, 21 seconds L (left handed) no deficit Motor  Motor Motor: Abnormal postural alignment and control Motor - Skilled Clinical Observations: decreased balance strategies  Trunk/Postural Assessment  Cervical Assessment Cervical Assessment: Within Functional Limits Thoracic Assessment Thoracic Assessment: Within Functional Limits Lumbar Assessment Lumbar Assessment: Within Functional Limits Postural Control Postural Control: Deficits on evaluation  Balance Balance Balance Assessed: Yes Standardized Balance  Assessment Standardized Balance Assessment: Berg Balance Test Berg Balance Test Sit to Stand: Able to stand without using hands and stabilize independently Standing Unsupported: Able to stand safely 2 minutes Sitting with Back Unsupported but Feet Supported on Floor or Stool: Able to sit safely and securely 2 minutes Stand to Sit: Sits safely with minimal use of hands Transfers: Able to transfer safely, minor use of hands Standing Unsupported with Eyes Closed: Able to stand 10 seconds safely Standing Ubsupported with Feet Together: Able to place feet together independently and stand 1 minute safely From Standing, Reach Forward with Outstretched Arm: Can reach confidently >25 cm (10") From Standing Position, Pick up Object from Floor: Able to pick up shoe safely and easily From Standing Position, Turn to Look Behind Over each Shoulder: Looks behind from both sides and weight shifts well Turn 360 Degrees: Able to turn 360 degrees safely in 4 seconds or less Standing Unsupported, Alternately Place Feet on Step/Stool: Able to stand independently and complete 8 steps >20 seconds Standing Unsupported, One Foot in Front: Able to plae foot ahead of the other independently and hold 30 seconds Standing on One Leg: Able to lift leg independently and hold equal to or more than 3 seconds Total Score: 52 Static Sitting Balance Static Sitting - Level of Assistance: 7: Independent Dynamic Sitting Balance Dynamic Sitting - Balance Support: No upper extremity supported;Feet unsupported;During functional activity Dynamic Sitting - Level of Assistance: 7: Independent Dynamic Sitting - Balance Activities: Forward lean/weight shifting;Reaching across midline;Reaching for objects Sitting balance - Comments: dynamic sitting EOB during LB dressing Static Standing Balance Static Standing - Balance Support: No upper extremity supported;During functional activity Static Standing - Level of Assistance: 7:  Independent Dynamic Standing Balance Dynamic Standing - Balance Support: During functional activity Dynamic Standing - Level of Assistance: 5: Stand by assistance Dynamic Standing - Balance Activities: Reaching for objects;Lateral lean/weight shifting;Forward lean/weight shifting Dynamic Standing - Comments: dynamic standing for LB dressing and bathing at shower level Extremity/Trunk Assessment RUE Assessment RUE Assessment: Within Functional Limits General Strength Comments: 4/5 general strength, WFL LUE Assessment LUE Assessment: Within Functional Limits General Strength Comments: 4/5 general strength, WFL  Care Tool Care Tool Self Care Eating    Independent    Oral Care     Independent    Bathing   Body parts bathed by patient: Right arm;Left arm;Chest;Abdomen;Front perineal area;Left upper leg;Right upper leg;Buttocks;Face;Left lower leg;Right lower leg     Assist Level: Contact Guard/Touching assist  Upper Body Dressing(including orthotics)   What is the patient wearing?: Pull over shirt   Assist Level: Set up assist    Lower Body Dressing (excluding footwear)   What is the patient wearing?: Underwear/pull up;Pants Assist for lower body dressing: Contact Guard/Touching assist    Putting on/Taking off footwear   What is the patient wearing?: Socks;Shoes Assist for footwear: Supervision/Verbal cueing       Care Tool Toileting Toileting activity   Assist for toileting: Contact Guard/Touching assist     Care Tool Bed Mobility Roll left and right activity   Roll left and right assist level: Independent    Sit to lying activity   Sit to lying assist level: Independent    Lying to sitting edge of bed activity   Lying to sitting edge of bed assist level: Independent     Care Tool Transfers Sit to stand transfer   Sit to stand assist level: Independent    Chair/bed transfer   Chair/bed transfer assist level: Independent     Toilet transfer   Assist  Level: Independent     Care Tool Cognition Expression of Ideas and Wants Expression of Ideas and Wants: Without difficulty (complex and basic) - expresses complex messages without difficulty and with speech that is clear and easy to understand   Understanding Verbal and Non-Verbal Content Understanding Verbal and Non-Verbal Content: Understands (complex and basic) - clear comprehension without cues or repetitions   Memory/Recall Ability *first 3 days only Memory/Recall Ability *first 3 days only: Current season;Location of own room;That he or she is in a hospital/hospital unit    Refer to Care Plan for [place] 1 OT Short Term Goal 1 (Week 1): STGs = LTGs d/t ELOS  Recommendations for other services: None    Skilled Therapeutic Intervention ADL ADL Upper Body Bathing: Supervision/safety Where Assessed-Upper Body Bathing: Shower Lower Body Bathing: Contact guard Where Assessed-Lower Body Bathing: Shower Upper Body Dressing: Supervision/safety Where Assessed-Upper Body Dressing: Edge of bed Lower Body Dressing: Contact guard Where Assessed-Lower Body Dressing: Edge of bed Toileting: Supervision/safety Where Assessed-Toileting: Glass blower/designer: Close supervision Toilet Transfer Method: Ambulating Tub/Shower Transfer: Metallurgist Method: Librarian, academic: Transfer tub bench;Grab bars Mobility  Transfers Sit to Stand: Independent Stand to Sit: Independent   Session 1: Pt greeted at time of session supine in bed resting comfortably, no c/o pain throughout session. Discussed POC, purpose and role of OT, and what to except at [facility] for the duration of her stay. Pt very pleasant and cooperative.   Supine to sit EOB Independent, SPT bed to wheelchair Supervision but no physical assist required and no AD. Set up at shower and pt performed SPT supervision with grab bars without difficulty. Doffed clothing  Independent. UB bathing/LB bathing at shower level supervision for safety and only brief CGA during stands to wash buttocks but no physical assist needed. Good safety awareness holding onto grab bars when standing and pacing. Dried off in the same manner and ambulated back to room with hand held assist Min/CGA to sit EOB where the pt performed UB dress Supervision and LB dress CGA only for brief stand to don over hips, but pt able to utilize figure four without cues. Able to don socks and shoes in the same manner with set up/supervision at EOB level. Pt with very good skill performance and discussed her potential short ELOS as she will have husband and daughter at home to assist.  9 hole peg performed d/t reports of neuropathy in L hand, results recorded and no deficit noted. Pt supine in bed resting with alarm on, call bell in reach.   Session 2: Pt greeted at time of session sitting up in recliner agreeable to OT session and no c/o pain. Ambulated without AD with Supervision/Mod I to/from gym, multiple transfers to various surfaces throughout session in the same manner. Dynamic standing activity with rebounder and 2kg green ball for 2x20 in standing to improve balance and endurance/NMR for BUEs. Ambulated throughout gym and retrieved washclothes at different heights including ground level requiring squatting to reach items and retrieve, no LOB and no AD used. Briefly reviewed kitchen simplification tips and home management techniques for energy conservation in ADL suite/apartment. Ambulated back to room Mod I/Supervision without AD and no difficulty or SOB. Sitting up in recliner alarm on, call bell in reach.    Discharge Criteria: Patient will be discharged from OT if patient refuses treatment 3 consecutive times without medical reason, if treatment goals not met, if there is a change in medical status, if patient makes no progress towards goals or if patient is discharged from hospital.  The above  assessment, treatment plan, treatment alternatives and goals were discussed and mutually agreed upon: by patient  Kristi [name] 06/20/2020, 12:51 PM

## 2020-06-20 NOTE — Progress Notes (Addendum)
Inpatient Rehabilitation Medication Review by a Pharmacist   A complete drug regimen review was completed for this patient to identify any potential clinically significant medication issues.   Clinically significant medication issues were identified:  yes     Type of Medication Issue Identified Description of Issue Urgent (address now) Non-Urgent (address on AM team rounds) Plan Plan Accepted by Provider? (Yes / No / Pending AM Rounds)  Drug Interaction(s) (clinically significant)            Duplicate Therapy            Allergy            No Medication Administration End Date            Incorrect Dose            Additional Drug Therapy Needed            Other   Magnesium 250 mg PO daily  Multivitamin 1 tablet PO daily  Miralex 17 g PO daily   Prevagen 1 capsule PO daily  Valsartan/HCTZ 320/25 mg PO daily in the morning   Non-urgent Message MD about medications on discharge summary that were not reordered when brought into rehab. Most importantly, the patient was discharged on valsartan/HCTZ, but only the ARB was ordered when brought into rehab.  Pending MD review      Name of provider notified for urgent issues identified: N/A   Provider Method of Notification: Secure chat   For non-urgent medication issues to be resolved on team rounds tomorrow morning a [facility] Secure Chat Handoff was sent to: Dr. Adam Phenix   Pharmacist comments: The patient's conditions seem to be well controlled and there are not any urgent issues that need addressed.   Time spent performing this drug regimen review (minutes):  5 minutes  Shauna Hugh, PharmD, [place]  PGY-1 Pharmacy Resident 06/20/2020 7:17 AM  Please check [email or web address] for unit-specific pharmacy phone numbers.

## 2020-06-20 NOTE — Plan of Care (Signed)
  Problem: RH SAFETY Goal: RH STG ADHERE TO SAFETY PRECAUTIONS W/ASSISTANCE/DEVICE Description: STG Adhere to Safety Precautions With Assistance/Device. Outcome: Progressing Goal: RH STG DECREASED RISK OF FALL WITH ASSISTANCE Description: STG Decreased Risk of Fall With Assistance. Outcome: Progressing   Problem: RH COGNITION-NURSING Goal: RH STG ANTICIPATES NEEDS/CALLS FOR ASSIST W/ASSIST/CUES Description: STG Anticipates Needs/Calls for Assist With Assistance/Cues. Outcome: Progressing   Problem: RH PAIN MANAGEMENT Goal: RH STG PAIN MANAGED AT OR BELOW PT'S PAIN GOAL Outcome: Progressing

## 2020-06-20 NOTE — Plan of Care (Signed)
°  Problem: RH Balance Goal: LTG: Patient will maintain dynamic sitting balance (OT) Description: LTG:  Patient will maintain dynamic sitting balance with assistance during activities of daily living (OT) Flowsheets (Taken 06/20/2020 1547) LTG: Pt will maintain dynamic sitting balance during ADLs with: Independent Goal: LTG Patient will maintain dynamic standing with ADLs (OT) Description: LTG:  Patient will maintain dynamic standing balance with assist during activities of daily living (OT)  Flowsheets (Taken 06/20/2020 1547) LTG: Pt will maintain dynamic standing balance during ADLs with: Independent   Problem: Sit to Stand Goal: LTG:  Patient will perform sit to stand in prep for activites of daily living with assistance level (OT) Description: LTG:  Patient will perform sit to stand in prep for activites of daily living with assistance level (OT) Flowsheets (Taken 06/20/2020 1547) LTG: PT will perform sit to stand in prep for activites of daily living with assistance level: Independent   Problem: RH Grooming Goal: LTG Patient will perform grooming w/assist,cues/equip (OT) Description: LTG: Patient will perform grooming with assist, with/without cues using equipment (OT) Flowsheets (Taken 06/20/2020 1547) LTG: Pt will perform grooming with assistance level of: Independent   Problem: RH Bathing Goal: LTG Patient will bathe all body parts with assist levels (OT) Description: LTG: Patient will bathe all body parts with assist levels (OT) Flowsheets (Taken 06/20/2020 1547) LTG: Pt will perform bathing with assistance level/cueing: Supervision/Verbal cueing LTG: Position pt will perform bathing: Shower   Problem: RH Dressing Goal: LTG Patient will perform upper body dressing (OT) Description: LTG Patient will perform upper body dressing with assist, with/without cues (OT). Flowsheets (Taken 06/20/2020 1547) LTG: Pt will perform upper body dressing with assistance level of: Set up  assist Goal: LTG Patient will perform lower body dressing w/assist (OT) Description: LTG: Patient will perform lower body dressing with assist, with/without cues in positioning using equipment (OT) Flowsheets (Taken 06/20/2020 1547) LTG: Pt will perform lower body dressing with assistance level of: Supervision/Verbal cueing   Problem: RH Toileting Goal: LTG Patient will perform toileting task (3/3 steps) with assistance level (OT) Description: LTG: Patient will perform toileting task (3/3 steps) with assistance level (OT)  Flowsheets (Taken 06/20/2020 1547) LTG: Pt will perform toileting task (3/3 steps) with assistance level: Independent   Problem: RH Toilet Transfers Goal: LTG Patient will perform toilet transfers w/assist (OT) Description: LTG: Patient will perform toilet transfers with assist, with/without cues using equipment (OT) Flowsheets (Taken 06/20/2020 1547) LTG: Pt will perform toilet transfers with assistance level of: Independent   Problem: RH Tub/Shower Transfers Goal: LTG Patient will perform tub/shower transfers w/assist (OT) Description: LTG: Patient will perform tub/shower transfers with assist, with/without cues using equipment (OT) Flowsheets (Taken 06/20/2020 1547) LTG: Pt will perform tub/shower stall transfers with assistance level of: Supervision/Verbal cueing

## 2020-06-20 NOTE — Evaluation (Signed)
Physical Therapy Assessment and Plan  Patient Details  Name: Kristi [name] MRN: [id number] Date of Birth: 1956/06/05  PT Diagnosis: Abnormality of gait, Ataxia and Difficulty walking Rehab Potential: Excellent ELOS: 3-4 days   Today's Date: 06/20/2020 PT Individual Time: 3299-2426 and 1304-1350 PT Individual Time Calculation (min): 60 min and 46 min  Hospital Problem: Principal Problem:   Cerebral infarction involving left cerebellar artery [facility])   Past Medical History:  Past Medical History:  Diagnosis Date  . Anemia    Iron deficinecy anemia  . Anemia 05/14/2017  . Anxiety   . Anxiety and depression 05/15/2014  . Arthritis   . Breast cancer ([place])    left ,last radiation 2'15, last chemo 8'14  . Constipation 11/13/2016  . Depression   . History of chicken pox   . History of radiation therapy 09/09/13-10/28/13   45 gray to left breast, lumpectomy cavity boosted to 63 gray  . HTN (hypertension) 11/13/2016  . Hyperglycemia 01/09/2016  . Hyperlipidemia 05/14/2017  . Hypertension   . MRSA (methicillin resistant Staphylococcus aureus) 2009   right groin area-no issues now. 04-07-14 PCR screen negative today.  . Neuropathy   . Preventative health care 11/13/2016   Past Surgical History:  Past Surgical History:  Procedure Laterality Date  . ANAL SPHINCTEROTOMY  04/2011  . APPENDECTOMY  1980  . AXILLARY LYMPH NODE DISSECTION Left 02/04/2013   Procedure: LEFT AXILLARY LYMPH NODE DISSECTION;  Surgeon: Stark Klein, MD;  Location: [place];  Service: General;  Laterality: Left;  End: 8341  . BREAST LUMPECTOMY WITH NEEDLE LOCALIZATION Left 02/04/2013   Procedure: LEFT BREAST LUMPECTOMY WITH NEEDLE LOCALIZATION;  Surgeon: Stark Klein, MD;  Location: [place];  Service: General;  Laterality: Left;  . BREAST SURGERY     Lumpectomy in april 2014  . HEMORRHOID SURGERY  04/2011   ligation  . MASTECTOMY Left 02/15/2017  . PORT-A-CATH REMOVAL N/A 04/16/2014   Procedure: REMOVAL PORT-A-CATH;  Surgeon: Stark Klein, MD;  Location: [facility];  Service: General;  Laterality: N/A;  . PORTACATH PLACEMENT Right 02/04/2013   Procedure: INSERTION PORT-A-CATH;  Surgeon: Stark Klein, MD;  Location: [place];  Service: General;  Laterality: Right;  Start Time: 9622.  Marland Kitchen SHOULDER ARTHROSCOPY WITH ROTATOR CUFF REPAIR AND SUBACROMIAL DECOMPRESSION Left 02/24/2014   Procedure: SHOULDER ARTHROSCOPY WITH ROTATOR CUFF REPAIR AND SUBACROMIAL DECOMPRESSION;  Surgeon: Meredith Pel, MD;  Location: [place];  Service: Orthopedics;  Laterality: Left;  LEFT SHOULDER DIAGNOSTIC OPERATIVE ARTHROSCOPY, SUBACROMIAL DECOMPRESSION, ROTATOR CUFF TEAR REPAIR  . SIMPLE MASTECTOMY WITH AXILLARY SENTINEL NODE BIOPSY Left 02/15/2017   Procedure: LEFT MASTECTOMY;  Surgeon: Stark Klein, MD;  Location: [place];  Service: General;  Laterality: Left;  . TOTAL MASTECTOMY Right 12/26/2018   Procedure: RIGHT BREAST PROPHYLATIC MASTECTOMY;  Surgeon: Stark Klein, MD;  Location: [place];  Service: General;  Laterality: Right;    Assessment & Plan Clinical Impression: Patient is a 64 y.o. year old right-handed female with history of anxiety, left breast cancer with radiation chemotherapy, hyperlipidemia and hypertension.  Per chart review lives with spouse independent prior to admission and driving.  Two-level home bed and bath on main level and 2 steps to entry.  Presented 06/15/2020 with dizziness and gait abnormality with vertigo nausea as well as mild to moderate headache.  Cranial CT scan showed area of low density in the inferior cerebellar hemisphere compatible with acute infarction.  No hemorrhage.  MRI identified acute subacute nonhemorrhagic infarction involving the inferior medial left  cerebellum additional acute subacute nonhemorrhagic posterior cortical infarct in the medial right cerebellum.  Patient did not receive TPA.  CT angiogram of head and neck high-grade stenosis of the origin of the left vertebral artery with occlusion less than 1 cm from the  origin.  The vessel was well reconstituted at the level of C6.  High-grade stenosis of the origin of the left external carotid artery.  Echocardiogram with ejection fraction of 18% grade 1 diastolic dysfunction no regional wall motion abnormalities.  Admission chemistries alcohol negative, glucose 105, creatinine 1.09, SARS coronavirus negative, hemoglobin 10.6.  Maintain on aspirin Plavix for CVA prophylaxis x3 months then aspirin alone.  Subcutaneous Lovenox for DVT prophylaxis.  Therapy evaluations completed and patient was admitted for a comprehensive rehab program. Patient transferred to CIR on 06/19/2020 .   Patient currently requires min with mobility secondary to ataxia, decreased visual motor skills and decreased postural control and decreased balance strategies.  Prior to hospitalization, patient was independent  with mobility and lived with Spouse in a House home.  Home access is 2Stairs to enter.  Patient will benefit from skilled PT intervention to maximize safe functional mobility, minimize fall risk and decrease caregiver burden for planned discharge home with intermittent assist.  Anticipate patient will benefit from follow up OP at discharge.  PT - End of Session Activity Tolerance: Endurance does not limit participation in activity;Tolerates 30+ min activity without fatigue Endurance Deficit: No PT Assessment Rehab Potential (ACUTE/IP ONLY): Excellent PT Barriers to Discharge: Home environment access/layout;Lack of/limited family support PT Barriers to Discharge Comments: Family will need education on guarding patient on the stairs at home, patient has 2 level home with bed/bath upstairs PT Patient demonstrates impairments in the following area(s): Balance;Safety PT Transfers Functional Problem(s): Furniture;Floor PT Locomotion Functional Problem(s): Ambulation;Stairs PT Plan PT Intensity: Minimum of 1-2 x/day ,45 to 90 minutes PT Frequency: 5 out of 7 days PT Duration Estimated  Length of Stay: 3-4 days PT Treatment/Interventions: Ambulation/gait training;Community reintegration;DME/adaptive equipment instruction;Neuromuscular re-education;Psychosocial support;Stair training;UE/LE Strength taining/ROM;Balance/vestibular training;Discharge planning;Functional electrical stimulation;Pain management;Skin care/wound management;Therapeutic Activities;UE/LE Coordination activities;Disease management/prevention;Functional mobility training;Patient/family education;Splinting/orthotics;Therapeutic Exercise;Visual/perceptual remediation/compensation PT Transfers Anticipated Outcome(s): Independent PT Locomotion Anticipated Outcome(s): Independent PT Recommendation Follow Up Recommendations: Outpatient PT Patient destination: Home Equipment Details: none  PT Evaluation Precautions/Restrictions Precautions Precautions: Fall Restrictions Weight Bearing Restrictions: No Pain Pain Assessment Pain Scale: 0-10 Pain Score: 0-No pain Home Living/Prior Functioning Home Living Available Help at Discharge: Family;Available 24 hours/day Type of Home: House Home Access: Stairs to enter [employer] of Steps: 2 Entrance Stairs-Rails: None Home Layout: Two level;Bed/bath upstairs;1/2 bath on main level Alternate Level Stairs-Number of Steps: 14 Alternate Level Stairs-Rails: Left Bathroom Shower/Tub: Multimedia programmer: Standard Bathroom Accessibility: Yes Additional Comments: shower/primary bathroom is on 2nd floor  Lives With: Spouse Prior Function Level of Independence: Independent with basic ADLs;Independent with homemaking with ambulation;Independent with gait;Independent with transfers  Able to Take Stairs?: Yes Driving: Yes Vocation: On disability Comments: Completed OPPT 3 months ago for falls/balance due to peripheral neuropathy s/p treatment for breast CA Vision/Perception  Vision - Assessment Eye Alignment: Within Functional Limits Ocular  Range of Motion: Within Functional Limits Alignment/Gaze Preference: Within Defined Limits Tracking/Visual Pursuits: Decreased smoothness of horizontal tracking Saccades: Additional eye shifts occurred during testing;Overshoots Convergence: Impaired (comment) Perception Perception: Within Functional Limits Praxis Praxis: Intact  Cognition Overall Cognitive Status: Within Functional Limits for tasks assessed Arousal/Alertness: Awake/alert Focused Attention: Appears intact Memory: Impaired (mildly) Memory Impairment: Decreased short term memory Immediate Memory  Recall: Sock;Blue;Bed Memory Recall Sock: Without Cue Memory Recall Blue: Without Cue Memory Recall Bed: With Cue Awareness: Appears intact Problem Solving: Appears intact Organizing: Appears intact Safety/Judgment: Appears intact Sensation Sensation Light Touch: Appears Intact Coordination Gross Motor Movements are Fluid and Coordinated: No Fine Motor Movements are Fluid and Coordinated: Yes Coordination and Movement Description: mild ataxic gait to/from bathroom Heel Shin Test: [facility] 9 Hole Peg Test: 22 seconds R, 21 seconds L (left handed) no deficit Motor  Motor Motor: Abnormal postural alignment and control Motor - Skilled Clinical Observations: decreased balance strategies  Trunk/Postural Assessment  Cervical Assessment Cervical Assessment: Within Functional Limits Thoracic Assessment Thoracic Assessment: Within Functional Limits Lumbar Assessment Lumbar Assessment: Within Functional Limits Postural Control Postural Control: Deficits on evaluation (mildly decreased/delayed)  Balance Balance Balance Assessed: Yes Standardized Balance Assessment Standardized Balance Assessment: Berg Balance Test;Functional Gait Assessment Berg Balance Test Sit to Stand: Able to stand without using hands and stabilize independently Standing Unsupported: Able to stand safely 2 minutes Sitting with Back Unsupported but Feet  Supported on Floor or Stool: Able to sit safely and securely 2 minutes Stand to Sit: Sits safely with minimal use of hands Transfers: Able to transfer safely, minor use of hands Standing Unsupported with Eyes Closed: Able to stand 10 seconds safely Standing Ubsupported with Feet Together: Able to place feet together independently and stand 1 minute safely From Standing, Reach Forward with Outstretched Arm: Can reach confidently >25 cm (10") From Standing Position, Pick up Object from Floor: Able to pick up shoe safely and easily From Standing Position, Turn to Look Behind Over each Shoulder: Looks behind from both sides and weight shifts well Turn 360 Degrees: Able to turn 360 degrees safely in 4 seconds or less Standing Unsupported, Alternately Place Feet on Step/Stool: Able to stand independently and complete 8 steps >20 seconds Standing Unsupported, One Foot in Front: Able to plae foot ahead of the other independently and hold 30 seconds Standing on One Leg: Able to lift leg independently and hold equal to or more than 3 seconds Total Score: 52 Static Sitting Balance Static Sitting - Level of Assistance: 7: Independent Dynamic Sitting Balance Dynamic Sitting - Balance Support: No upper extremity supported;Feet unsupported;During functional activity Dynamic Sitting - Level of Assistance: 7: Independent Dynamic Sitting - Balance Activities: Forward lean/weight shifting;Reaching across midline;Reaching for objects Sitting balance - Comments: dynamic sitting EOB during LB dressing Static Standing Balance Static Standing - Balance Support: No upper extremity supported;During functional activity Static Standing - Level of Assistance: 7: Independent Dynamic Standing Balance Dynamic Standing - Balance Support: During functional activity Dynamic Standing - Level of Assistance: 5: Stand by assistance Dynamic Standing - Balance Activities: Reaching for objects;Lateral lean/weight shifting;Forward  lean/weight shifting Dynamic Standing - Comments: dynamic standing for LB dressing and bathing at shower level Functional Gait  Assessment Gait Level Surface: Walks 20 ft, slow speed, abnormal gait pattern, evidence for imbalance or deviates 10-15 in outside of the 12 in walkway width. Requires more than 7 sec to ambulate 20 ft. (10.5 sec) Change in Gait Speed: Able to change speed, demonstrates mild gait deviations, deviates 6-10 in outside of the 12 in walkway width, or no gait deviations, unable to achieve a major change in velocity, or uses a change in velocity, or uses an assistive device. Gait with Horizontal Head Turns: Performs head turns with moderate changes in gait velocity, slows down, deviates 10-15 in outside 12 in walkway width but recovers, can continue to walk. Gait with Vertical  Head Turns: Performs task with moderate change in gait velocity, slows down, deviates 10-15 in outside 12 in walkway width but recovers, can continue to walk. Gait and Pivot Turn: Pivot turns safely in greater than 3 sec and stops with no loss of balance, or pivot turns safely within 3 sec and stops with mild imbalance, requires small steps to catch balance. Step Over Obstacle: Is able to step over 2 stacked shoe boxes taped together (9 in total height) without changing gait speed. No evidence of imbalance. Gait with Narrow Base of Support: Ambulates 4-7 steps. Gait with Eyes Closed: Cannot walk 20 ft without assistance, severe gait deviations or imbalance, deviates greater than 15 in outside 12 in walkway width or will not attempt task. Ambulating Backwards: Walks 20 ft, slow speed, abnormal gait pattern, evidence for imbalance, deviates 10-15 in outside 12 in walkway width. Steps: Two feet to a stair, must use rail. Total Score: 13 Extremity Assessment  RLE Assessment RLE Assessment: Within Functional Limits Active Range of Motion (AROM) Comments: WFL for all functional mobility General Strength  Comments: Grossly 5/5 throughout in sitting LLE Assessment LLE Assessment: Within Functional Limits Active Range of Motion (AROM) Comments: WFL for all functional mobility General Strength Comments: Grossly 5/5 throughout in sitting  Care Tool Care Tool Bed Mobility Roll left and right activity   Roll left and right assist level: Independent    Sit to lying activity   Sit to lying assist level: Independent    Lying to sitting edge of bed activity   Lying to sitting edge of bed assist level: Independent     Care Tool Transfers Sit to stand transfer   Sit to stand assist level: Independent    Chair/bed transfer   Chair/bed transfer assist level: Independent     Toilet transfer   Assist Level: Ship broker transfer assist level: Supervision/Verbal cueing      Care Tool Locomotion Ambulation   Assist level: Contact Guard/Touching assist Assistive device: No Device Max distance: >250 ft  Walk 10 feet activity   Assist level: Contact Guard/Touching assist Assistive device: No Device   Walk 50 feet with 2 turns activity   Assist level: Contact Guard/Touching assist Assistive device: No Device  Walk 150 feet activity   Assist level: Contact Guard/Touching assist Assistive device: No Device  Walk 10 feet on uneven surfaces activity   Assist level: Contact Guard/Touching assist    Stairs   Assist level: Minimal Assistance - Patient > 75% Stairs assistive device: No device Max number of stairs: 12  Walk up/down 1 step activity   Walk up/down 1 step (curb) assist level: Supervision/Verbal cueing Walk up/down 1 step or curb assistive device: No device    Walk up/down 4 steps activity Walk up/down 4 steps assist level: Supervision/Verbal cueing Walk up/down 4 steps assistive device: No device  Walk up/down 12 steps activity   Walk up/down 12 steps assist level: Minimal Assistance - Patient > 75% Walk up/down 12 steps assistive device: No device   Pick up small objects from floor   Pick up small object from the floor assist level: Independent    Wheelchair Will patient use wheelchair at discharge?: 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  Recommendations for other services: None   Skilled Therapeutic Intervention In addition to the PT evaluation above, the patient performed the following skilled PT interventions:  Session 1: Patient in bed upon PT arrival. Patient alert and agreeable to PT session. Patient denied pain during session.  Therapeutic Activity: Bed Mobility: Patient performed all bed mobility independently in a flat bed without use of bed rails. Transfers: Patient performed sit to stand, stand pivot, furniture, and toilet transfers independently during session. Patient performed a simulated sedan height car transfer with supervision using car door for stabilizing. Educated on decreased fall risk with stabilizing on a stable surface such as the seat or car frame versus the car door. Patient stated understanding.  Gait Training:  Patient ambulated >250 feet using without an AD, progressed from CGA to close supervision as patient gained confidence and consistency in gait over time. Ambulated with intermittent narrow BOS without scissoring, variable foot placement that improved with repetition, decreased gait speed, and decreased trunk rotation and arm swing. PT observed first trial then patient ambulated >150 ft x2 with supervision for safety. Provided verbal cues for increased BOS and increased gait speed with arm swing for improved balance with gait. 6 Min Walk Test:   Instructed patient to ambulate as quickling and as safely as possible for 6 minutes using LRAD. Patient was allowed to take standing rest breaks without stopping the test, but if he required a sitting rest break the clock would be stopped and the test would be over.  Results: 494 feet with supervision without AD   Neuromuscular Re-ed: Patient performed the Berg Balance Test: Patient demonstrates increased fall risk as noted by score of 52/56 on Berg Balance Scale.  (<36= high risk for falls, close to 100%; 37-45 significant >80%; 46-51 moderate >50%; 52-55 lower >25%)  Instructed pt in results of PT evaluation as detailed above, PT POC, rehab potential, rehab goals, and discharge recommendations. Additionally discussed CIR's policies regarding fall safety and use of chair alarm and/or quick release belt. Pt verbalized understanding and in agreement. Will update pt's family members as they become available.  Patient in recliner in the room at end of session with breaks locked, chair alarm set, and all needs within reach.   Session 2: Patient in recliner in the room upon PT arrival. Patient alert and agreeable to PT session. Patient denied pain during session. Discussed family education with her daughter tomorrow. Patient's daughter to attend afternoon sessions with OT and PT from 1:30-4:30 tomorrow.  Therapeutic Activity: Transfers: Patient performed sit to/from stand independently throughout session. Patient performed a floor transfer, mat table<>floor, with close supervision following PT demonstration. Discussed signs and symptoms that indicate patient should not get up from a fall, patient able to recall 2/5 using teach back method. Educated on performing self-check prior to getting up from a fall, activating emergency response if critical signs or symptoms are present, calling for assist any time she attempts to get up from a fall, crawling to steady furniture to go from the floor to sitting, not  standing, for safety, and having her cell phone in her pocket at all times with home alone to call for assistance. Patient sated understanding and provided the patient with a handout for improved recall.  Five times Sit to Stand Test (  FTSS) Method: Use a straight back chair with a solid seat that is 16-18" high. Ask participant to sit on the chair with arms folded across their chest.   Instructions: "Stand up and sit down as quickly as possible 5 times, keeping your arms folded across your chest."   Measurement: Stop timing when the participant stands the 5th time.  TIME: __10.01____ (in seconds)  Times > 13.6 seconds is associated with increased disability and morbidity (Guralnik, 2000) Times > 15 seconds is predictive of recurrent falls in healthy individuals aged 68 and older (Buatois, et al., 2008) Normal performance values in community dwelling individuals aged 76 and older (Bohannon, 2006): o 60-69 years: 11.4 seconds o 70-79 years: 12.6 seconds o 80-89 years: 14.8 seconds  MCID: ? 2.3 seconds for Vestibular Disorders (Meretta, 2006)  Gait Training:  Patient ambulated >50 feet and >150 feet x2 without an AD with supervision for safety. Ambulated as above with improved BOS and consistency of foot placement. Provided verbal cues for increased arm swing and gait speed for improved balance and decreased SLS time. Patient ascended/descended 16 steps using L rail to simulate home set up with supervision. Performed reciprocal gait pattern throughout. Provided cues for technique and sequencing.   Neuromuscular Re-ed: Patient performed the Functional Gait Assessment: Patient demonstrates increased fall risk as noted by score of 13/30 on Functional Gait Assessment.  (<19=increased risk for falls)  Educated patient on fall risk/prevention, home modifications to prevent falls, and activation of emergency services in the event of a fall during session. Also educated patient on results and  interpretation of results on all outcome measures assessed. Patient appreciative and stated understanding. Provided handout with scores for review with family and OPPT.  Patient in recliner in the room at end of session with breaks locked, chair alarm set, and all needs within reach.   Discharge Criteria: Patient will be discharged from PT if patient refuses treatment 3 consecutive times without medical reason, if treatment goals not met, if there is a change in medical status, if patient makes no progress towards goals or if patient is discharged from hospital.  The above assessment, treatment plan, treatment alternatives and goals were discussed and mutually agreed upon: by patient  Doreene Burke PT, DPT  06/20/2020, 3:48 PM

## 2020-06-20 NOTE — Progress Notes (Signed)
[facility] PHYSICAL MEDICINE & REHABILITATION PROGRESS NOTE   Subjective/Complaints: Has some increase in chemotherapy induced neuropathy with movement. Not on her home Gabapentin dose of 600/600/800- instead on 700 BID. Changed to 600/600/900- she tolerates this medication well.  ROS: -nausea, vomiting  Objective:   No results found. No results for input(s): WBC, HGB, HCT, PLT in the last 72 hours. No results for input(s): NA, K, CL, CO2, GLUCOSE, BUN, CREATININE, CALCIUM in the last 72 hours.  Intake/Output Summary (Last 24 hours) at 06/20/2020 1245 Last data filed at 06/20/2020 0739 Gross per 24 hour  Intake 338 ml  Output --  Net 338 ml     Physical Exam: Vital Signs Blood pressure 115/70, pulse (!) 57, temperature 99.6 F (37.6 C), temperature source Oral, resp. rate 20, height 5\' 2"  (1.575 m), weight 67.4 kg, last menstrual period 01/20/2013, SpO2 100 %. Physical Exam General: Alert and oriented x 3, No apparent distress HEENT: Head is normocephalic, atraumatic, PERRLA, EOMI, sclera anicteric, oral mucosa pink and moist, dentition intact, ext ear canals clear,  Neck: Supple without JVD or lymphadenopathy Heart: Reg rate and rhythm. No murmurs rubs or gallops Chest: CTA bilaterally without wheezes, rales, or rhonchi; no distress Abdomen: Soft, non-tender, non-distended, bowel sounds positive. Extremities: No clubbing, cyanosis, or edema. Pulses are 2+ Skin: Clean and intact without signs of breakdown Neuro: Patient is alert in no acute distress.  Follows commands.  Oriented x3.  Fair awareness of deficits. 5/5 strength throughout. No sensory deficit. FTN mildly slowed bilaterally. Sensation intact Psych: Pt's affect is appropriate. Pt is cooperative  Assessment/Plan: 1. Functional deficits secondary to left cerebellar artery stroke which require 3+ hours per day of interdisciplinary therapy in a comprehensive inpatient rehab setting.  Physiatrist is providing close team  supervision and 24 hour management of active medical problems listed below.  Physiatrist and rehab team continue to assess barriers to discharge/monitor patient progress toward functional and medical goals  Care Tool:  Bathing    Body parts bathed by patient: Right arm, Left arm, Chest, Abdomen, Front perineal area, Left upper leg, Right upper leg, Buttocks, Face, Left lower leg, Right lower leg         Bathing assist Assist Level: Contact Guard/Touching assist     Upper Body Dressing/Undressing Upper body dressing   What is the patient wearing?: Pull over shirt    Upper body assist Assist Level: Set up assist    Lower Body Dressing/Undressing Lower body dressing      What is the patient wearing?: Underwear/pull up, Pants     Lower body assist Assist for lower body dressing: Contact Guard/Touching assist     Toileting Toileting    Toileting assist Assist for toileting: Contact Guard/Touching assist     Transfers Chair/bed transfer  Transfers assist     Chair/bed transfer assist level: Independent     Locomotion Ambulation   Ambulation assist      Assist level: Contact Guard/Touching assist Assistive device: No Device Max distance: >250 ft   Walk 10 feet activity   Assist     Assist level: Contact Guard/Touching assist Assistive device: No Device   Walk 50 feet activity   Assist    Assist level: Contact Guard/Touching assist Assistive device: No Device    Walk 150 feet activity   Assist    Assist level: Contact Guard/Touching assist Assistive device: No Device    Walk 10 feet on uneven surface  activity   Assist  Assist level: Contact Guard/Touching assist     Wheelchair     Assist Will patient use wheelchair at discharge?: No (patient is a functional ambulator)   Wheelchair activity did not occur: N/A         Wheelchair 50 feet with 2 turns activity    Assist    Wheelchair 50 feet with 2 turns  activity did not occur: N/A       Wheelchair 150 feet activity     Assist  Wheelchair 150 feet activity did not occur: N/A       Blood pressure 115/70, pulse (!) 57, temperature 99.6 F (37.6 C), temperature source Oral, resp. rate 20, height 5\' 2"  (1.575 m), weight 67.4 kg, last menstrual period 01/20/2013, SpO2 100 %.    Medical Problem List and Plan: 1.  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  Antithrombotics: -DVT/anticoagulation: Lovenox- may discontinue as ambulating 175 feet.              -antiplatelet therapy: Aspirin 325 mg daily and Plavix 25 mg daily x3 months then aspirin alone 3. Pain Management: Increase Neurontin to 600/600/900, hydrocodone as needed for chemotherapy-induced peripheral neuropathy.  4. Mood: Trazodone 50 mg nightly provide emotional support             -antipsychotic agents: Latuda 40 mg nightly 5. Neuropsych: This patient is capable of making decisions on her own behalf. 6. Skin/Wound Care: Routine skin checks 7. Fluids/Electrolytes/Nutrition: Routine in and outs with follow-up chemistries 8.  Hypertension.  Avapro 300 mg daily.  Monitor with increased mobility. Well controlled 9.  Hyperlipidemia.  Lipitor. Discussed that lipid panel was normal 10.  Left breast cancer.  Continue Arimidex daily   LOS: 1 days A FACE TO FACE EVALUATION WAS PERFORMED  Clide Deutscher Jestine Bicknell 06/20/2020, 12:45 PM

## 2020-06-20 NOTE — Plan of Care (Signed)
  Problem: RH SAFETY Goal: RH STG ADHERE TO SAFETY PRECAUTIONS W/ASSISTANCE/DEVICE Description: STG Adhere to Safety Precautions With min Assistance/Device. Outcome: Progressing Goal: RH STG DECREASED RISK OF FALL WITH ASSISTANCE Description: STG Decreased Risk of Fall With min Assistance. Outcome: Progressing   Problem: RH COGNITION-NURSING Goal: RH STG ANTICIPATES NEEDS/CALLS FOR ASSIST W/ASSIST/CUES Description: STG Anticipates Needs/Calls for Assist independently.  Outcome: Progressing   Problem: RH PAIN MANAGEMENT Goal: RH STG PAIN MANAGED AT OR BELOW PT'S PAIN GOAL Description: Less than 3 Outcome: Progressing

## 2020-06-21 ENCOUNTER — Inpatient Hospital Stay (HOSPITAL_COMMUNITY): Payer: Medicare Other | Admitting: Occupational Therapy

## 2020-06-21 ENCOUNTER — Other Ambulatory Visit: Payer: Self-pay | Admitting: Physical Medicine and Rehabilitation

## 2020-06-21 ENCOUNTER — Other Ambulatory Visit: Payer: Self-pay | Admitting: Family Medicine

## 2020-06-21 ENCOUNTER — Inpatient Hospital Stay (HOSPITAL_COMMUNITY): Payer: Medicare Other

## 2020-06-21 LAB — CBC WITH DIFFERENTIAL/PLATELET
Abs Immature Granulocytes: 0.01 10*3/uL (ref 0.00–0.07)
Basophils Absolute: 0 10*3/uL (ref 0.0–0.1)
Basophils Relative: 0 %
Eosinophils Absolute: 0.1 10*3/uL (ref 0.0–0.5)
Eosinophils Relative: 2 %
HCT: 31.3 % — ABNORMAL LOW (ref 36.0–46.0)
Hemoglobin: 10.3 g/dL — ABNORMAL LOW (ref 12.0–15.0)
Immature Granulocytes: 0 %
Lymphocytes Relative: 48 %
Lymphs Abs: 2.9 10*3/uL (ref 0.7–4.0)
MCH: 28 pg (ref 26.0–34.0)
MCHC: 32.9 g/dL (ref 30.0–36.0)
MCV: 85.1 fL (ref 80.0–100.0)
Monocytes Absolute: 0.4 10*3/uL (ref 0.1–1.0)
Monocytes Relative: 7 %
Neutro Abs: 2.6 10*3/uL (ref 1.7–7.7)
Neutrophils Relative %: 43 %
Platelets: 163 10*3/uL (ref 150–400)
RBC: 3.68 MIL/uL — ABNORMAL LOW (ref 3.87–5.11)
RDW: 13.9 % (ref 11.5–15.5)
WBC: 6.1 10*3/uL (ref 4.0–10.5)
nRBC: 0 % (ref 0.0–0.2)

## 2020-06-21 LAB — COMPREHENSIVE METABOLIC PANEL
ALT: 17 U/L (ref 0–44)
AST: 18 U/L (ref 15–41)
Albumin: 3.3 g/dL — ABNORMAL LOW (ref 3.5–5.0)
Alkaline Phosphatase: 66 U/L (ref 38–126)
Anion gap: 11 (ref 5–15)
BUN: 9 mg/dL (ref 8–23)
CO2: 26 mmol/L (ref 22–32)
Calcium: 9.9 mg/dL (ref 8.9–10.3)
Chloride: 104 mmol/L (ref 98–111)
Creatinine, Ser: 0.93 mg/dL (ref 0.44–1.00)
GFR calc Af Amer: >60 mL/min (ref >60)
GFR calc non Af Amer: >60 mL/min (ref >60)
Glucose, Bld: 97 mg/dL (ref 70–99)
Potassium: 4.2 mmol/L (ref 3.5–5.1)
Sodium: 141 mmol/L (ref 135–145)
Total Bilirubin: 0.8 mg/dL (ref 0.3–1.2)
Total Protein: 6.1 g/dL — ABNORMAL LOW (ref 6.5–8.1)

## 2020-06-21 MED ORDER — ATORVASTATIN CALCIUM 40 MG PO TABS: 40.0000 mg | ORAL_TABLET | Freq: Every day | ORAL | 0 refills | Status: DC

## 2020-06-21 MED ORDER — GABAPENTIN 600 MG PO TABS: 600.0000 mg | ORAL_TABLET | Freq: Three times a day (TID) | ORAL | 1 refills | Status: DC

## 2020-06-21 MED ORDER — TRAZODONE HCL 50 MG PO TABS: 50.0000 mg | ORAL_TABLET | Freq: Every day | ORAL | 0 refills | Status: DC

## 2020-06-21 MED ORDER — HYDROCODONE-ACETAMINOPHEN 10-325 MG PO TABS: 1.0000 | ORAL_TABLET | Freq: Four times a day (QID) | ORAL | 0 refills | Status: DC | PRN

## 2020-06-21 MED ORDER — HYDROXYZINE HCL 25 MG PO TABS
50.0000 mg | ORAL_TABLET | Freq: Two times a day (BID) | ORAL | Status: DC
  Administered 2020-06-21 – 2020-06-22 (×3): 50 mg via ORAL
  Filled 2020-06-21 (×3): qty 2

## 2020-06-21 MED ORDER — HYDROXYZINE HCL 50 MG PO TABS: 50.0000 mg | ORAL_TABLET | Freq: Two times a day (BID) | ORAL | 0 refills | Status: DC

## 2020-06-21 MED ORDER — MAGNESIUM 250 MG PO TABS: 1.0000 | ORAL_TABLET | Freq: Every day | ORAL | 0 refills | Status: DC

## 2020-06-21 MED ORDER — VALSARTAN-HYDROCHLOROTHIAZIDE 320-25 MG PO TABS: ORAL_TABLET | ORAL | 1 refills | Status: DC

## 2020-06-21 MED ORDER — IRBESARTAN 300 MG PO TABS: 300.0000 mg | ORAL_TABLET | Freq: Every day | ORAL | 0 refills | Status: DC

## 2020-06-21 MED ORDER — GABAPENTIN 300 MG PO CAPS
ORAL_CAPSULE | ORAL | 2 refills | Status: DC
Start: 2020-06-21 — End: 2020-07-30

## 2020-06-21 MED ORDER — GABAPENTIN 600 MG PO TABS
600.0000 mg | ORAL_TABLET | Freq: Two times a day (BID) | ORAL | Status: DC
  Administered 2020-06-21 – 2020-06-22 (×2): 600 mg via ORAL
  Filled 2020-06-21 (×2): qty 1

## 2020-06-21 MED ORDER — VALSARTAN-HYDROCHLOROTHIAZIDE 320-25 MG PO TABS: 1.0000 | ORAL_TABLET | Freq: Every day | ORAL | 1 refills | Status: DC

## 2020-06-21 MED ORDER — CLOPIDOGREL BISULFATE 75 MG PO TABS: 75.0000 mg | ORAL_TABLET | Freq: Every day | ORAL | 0 refills | Status: DC

## 2020-06-21 MED ORDER — GABAPENTIN 600 MG PO TABS: 300.0000 mg | ORAL_TABLET | Freq: Every day | ORAL | 0 refills | Status: DC

## 2020-06-21 MED ORDER — GABAPENTIN 600 MG PO TABS
900.0000 mg | ORAL_TABLET | Freq: Every day | ORAL | Status: DC
  Administered 2020-06-21: 900 mg via ORAL
  Filled 2020-06-21: qty 2

## 2020-06-21 NOTE — Progress Notes (Signed)
Physical Therapy Discharge Summary  Patient Details  Name: Kristi [name] MRN: [id number] Date of Birth: [birth year]  Today's Date: 06/21/2020 PT Individual Time: 1330-1430 PT Individual Time Calculation (min): 60 min    Patient has met 5 of 6 long term goals due to improved balance and improved coordination.  Patient to discharge at an ambulatory level Independent.   Patient's care partner is independent to provide the necessary physical assistance at discharge.  Reasons goals not met: all goals met with exception of floor transfer which was not addressed due to very limited length of stay  Recommendation:  Patient will benefit from ongoing skilled PT services in outpatient setting to continue to advance safe functional mobility, address ongoing impairments in standing balance, higher level gait activities, and minimize fall risk.  Equipment: No equipment provided  Reasons for discharge: treatment goals met  Patient/family agrees with progress made and goals achieved: Yes   Therapeutic Intervention: Pt initially in recliner w/daughter at bedside and husband listening via phone.  Pt educated re: importance of blood pressure monitoring on daily basis/am/pm w/recording of measures to take to f/u MD appts and provided w/handout.  Discussed optimal technique.  Pt stated that she did experience low bp at times and discussed hydration as well as care w/positional changes.    Pt ambulated mult gait trials witout AD distances ranging from 100-300+ft independently.  Performed reassessment of all mobility and transfers as described below/see caretool as well.  Balance training included: Functional gait including weaving around tightly placed cones, stepping up and over step, standing on foam and performing cervical nods and rotation (cga/increased sway w/this), backing 32f, and picking up ojects off of floor all w/cga.  Daughter observing session and noted sway w/increased challenges/understands  current balance deficits.  Stairs - performed w/single rail w/supervision, daughter observed and discussed recommendation for supervision at home for safety.  HEP - included standing tandem, standing w/feet together eyes open/closed, standing cervical nods and rotation at counter w/chair and family supervision for safety.  Pt left oob in recliner w/chair alarm set and needs in reach. Daughter at bedside.   PT Discharge Precautions/Restrictions Precautions Precautions: Fall Precaution Comments: central vestibular impairment Restrictions Weight Bearing Restrictions: No Vital Signs Therapy Vitals Pulse Rate: 63 Resp: 18 BP: 133/73 Patient Position (if appropriate): Sitting Oxygen Therapy SpO2: 100 % O2 Device: Room Air Pain  denies pain Vision/Perception     Cognition Overall Cognitive Status: Within Functional Limits for tasks assessed Arousal/Alertness: Awake/alert Orientation Level: Oriented X4 Attention: Focused Focused Attention: Appears intact Sensation Sensation Light Touch: Appears Intact Proprioception: Appears Intact Additional Comments: Pt with peripheral neuropathy in the left distal fingers PTA. Motor  Motor Motor: Abnormal postural alignment and control Motor - Skilled Clinical Observations: decreased balance strategies Motor - Discharge Observations: mild decrease in balance strategies esp when incorporating head turns and nods  Mobility Transfers Transfers: Sit to Stand;Stand to Sit;Stand Pivot Transfers Sit to Stand: Independent Stand to Sit: Independent Stand Pivot Transfers: Independent Transfer (Assistive device): None Locomotion  Gait Gait Assistance: Independent Gait Distance (Feet): 150 Feet Assistive device: None Gait Gait: Yes Gait Pattern: Within Functional Limits Gait velocity: mild decrease  Trunk/Postural Assessment  Cervical Assessment Cervical Assessment: Within Functional Limits Thoracic Assessment Thoracic Assessment:  Within Functional Limits Lumbar Assessment Lumbar Assessment: Within Functional Limits  Balance Balance Balance Assessed: Yes Static Sitting Balance Static Sitting - Level of Assistance: 7: Independent Dynamic Sitting Balance Dynamic Sitting - Level of Assistance: 7: Independent Static Standing Balance Static  Standing - Balance Support: No upper extremity supported;During functional activity Static Standing - Level of Assistance: 7: Independent (with normal base of support) Static Stance: On foam, eyes closed Static Stance: Eyes Closed: mild increased sway Static Stance: on Foam: mild increased sway Static Stance: on Foam, Eyes Closed: cga, moderate increased sway Dynamic Standing Balance Dynamic Standing - Balance Support: During functional activity Dynamic Standing - Level of Assistance: 7: Independent Dynamic Standing - Comments: pt w/deficits when base of support decreased, standing on uneven surfaces, and esp w/cervical nods/turns statically and during functional gait Extremity Assessment  RUE Assessment RUE Assessment: Within Functional Limits LUE Assessment LUE Assessment: Within Functional Limits RLE Assessment RLE Assessment: Within Functional Limits Active Range of Motion (AROM) Comments: WFL for all functional mobility General Strength Comments: Grossly 5/5 throughout in sitting LLE Assessment LLE Assessment: Within Functional Limits Active Range of Motion (AROM) Comments: WFL for all functional mobility General Strength Comments: Grossly 5/5 throughout in sitting    Jerrilyn Cairo 06/21/2020, 5:36 PM

## 2020-06-21 NOTE — Progress Notes (Signed)
Occupational Therapy Discharge Summary  Patient Details  Name: Kristi [name] MRN: [id number] Date of Birth: 1955-12-20  Today's Date: 06/21/2020 OT Individual Time: 1856-3149 OT Individual Time Calculation (min): 41 min   Session Note:  Pt completed ADL session during OT.  She was able to ambulate without an assistive device and then complete all aspects of bathing and dressing at modified independent level.  She was also able to stand at the sink and complete grooming tasks at independent level.  No significant reports of dizziness, just one small episode with supine to sit which lasted less than 3-4 seconds.  Finished session with pt sitting in the recliner with the call button and phone in reach.  Patient has met 10 of 10 long term goals due to improved balance and improved coordination.  Patient to discharge at overall Modified Independent level.  Patient's care partner is independent to provide the necessary physical and cognitive assistance at discharge.    Reasons goals not met: NA  Recommendation:  Pt with no further OT needs at this time.  Equipment: No equipment provided  Reasons for discharge: treatment goals met and discharge from hospital  Patient/family agrees with progress made and goals achieved: Yes  OT Discharge Precautions/Restrictions  Precautions Precautions: Fall Precaution Comments: central vestibular impairment Restrictions Weight Bearing Restrictions: No   Pain Pain Assessment Pain Scale: 0-10 Pain Score: 0-No pain ADL ADL Eating: Independent Where Assessed-Eating: Chair Grooming: Independent Where Assessed-Grooming: Standing at sink Upper Body Bathing: Modified independent Where Assessed-Upper Body Bathing: Shower Lower Body Bathing: Modified independent Where Assessed-Lower Body Bathing: Shower Upper Body Dressing: Independent Where Assessed-Upper Body Dressing: Edge of bed Lower Body Dressing: Independent Where Assessed-Lower Body Dressing:  Edge of bed Toileting: Independent Where Assessed-Toileting: Glass blower/designer: Programmer, applications Method: Counselling psychologist: Raised toilet seat Tub/Shower Transfer: Theatre stage manager Method: Librarian, academic: Radio broadcast assistant, Energy manager: Modified independent Social research officer, government Method: Heritage manager: Radio broadcast assistant Vision Baseline Vision/History: Wears glasses Wears Glasses: At all times Patient Visual Report: No change from baseline Vision Assessment?: Yes Eye Alignment: Within Functional Limits Ocular Range of Motion: Within Functional Limits Alignment/Gaze Preference: Within Defined Limits Tracking/Visual Pursuits: Decreased smoothness of horizontal tracking Saccades: Additional eye shifts occurred during testing;Overshoots Convergence: Within functional limits Visual Fields: No apparent deficits Additional Comments: pt with no report of dizziness with functional tasks overall but did report slight dizziness with gaze stabilization exercise Perception  Perception: Within Functional Limits Praxis Praxis: Intact Cognition Overall Cognitive Status: Within Functional Limits for tasks assessed Arousal/Alertness: Awake/alert Attention: Focused Focused Attention: Appears intact Problem Solving: Appears intact Safety/Judgment: Appears intact Sensation Sensation Light Touch: Appears Intact Proprioception: Appears Intact Additional Comments: Pt with peripheral neuropathy in the left distal fingers PTA. Coordination Gross Motor Movements are Fluid and Coordinated: Yes Fine Motor Movements are Fluid and Coordinated: Yes Motor   WFLS Mobility  Transfers Sit to Stand: Independent Stand to Sit: Independent  Trunk/Postural Assessment  Cervical Assessment Cervical Assessment: Within Functional Limits Thoracic Assessment Thoracic Assessment: Within Functional  Limits Lumbar Assessment Lumbar Assessment: Within Functional Limits  Balance Balance Balance Assessed: Yes Static Sitting Balance Static Sitting - Level of Assistance: 7: Independent Dynamic Sitting Balance Dynamic Sitting - Balance Support: No upper extremity supported;Feet unsupported;During functional activity Dynamic Sitting - Level of Assistance: 7: Independent Static Standing Balance Static Standing - Balance Support: No upper extremity supported;During functional activity Static Standing - Level of Assistance: 7:  Independent Dynamic Standing Balance Dynamic Standing - Level of Assistance: 7: Independent Extremity/Trunk Assessment RUE Assessment RUE Assessment: Within Functional Limits General Strength Comments: 4/5 general strength, WFL LUE Assessment LUE Assessment: Within Functional Limits General Strength Comments: 4/5 general strength, WFL   , OTR/L 06/21/2020, 12:33 PM

## 2020-06-21 NOTE — Progress Notes (Signed)
PMR Admission Coordinator Pre-Admission Assessment   Patient: Kristi [name] is an 64 y.o., female MRN: [id number] DOB: 09/17/1956 Height: _0  (157.5 cm) Weight: 72.2 kg                                                                                                                                                  Insurance Information HMO: Yes    PPO:       PCP:       IPA:       80/20:       OTHER:   PRIMARY: [employer] Medicare      Policy#: [id number]      Subscriber: [employer] CM Name: Juliann Pulse      Phone#: [phone]     Fax#: [phone] Pre-Cert#: [id number] Juliann Pulse with Bernadene Bell provided Josem Kaufmann for admit 8/13 through 8/19 with updates due to fax listed above on 8/19.    Employer: Disabled Benefits:  Phone #: [phone]     Name: Online [email or web address] portal Eff. Date: 01/05/20     Deduct: $0      Out of Pocket Max: $3600 (met [phone])      Life Max: N/A  CIR: $295/day copay for days 1-5      SNF: $0 days 1-20; $184 days 21-40; $0 copay days 41-100 Outpatient: medical necessity     Co-Pay: $30/visit Home Health: 100%      Co-Pay: none DME: 80%     Co-Pay: 20% Providers: in network  SECONDARY:        Policy#:        Phone#:     Development worker, community:        Phone#:     The Engineer, petroleum" for patients in Inpatient Rehabilitation Facilities with attached "Privacy Act [place] Records" was provided and verbally reviewed with: Patient and Family   Emergency Contact Information         Contact Information     Name Relation Home Work Mobile    [place] Spouse [phone]   [phone]    Marcia Brash Daughter [phone]   [phone]       Current Medical History  Patient Admitting Diagnosis: L cerebellar infarct; B subacute cerebellar infarcts   History of Present Illness: A 64 y.o. right-handed female with history of anxiety, left breast cancer with radiation chemotherapy, hyperlipidemia, hypertension.  History taken from chart review and  patient. Patient lives with spouse.  Independent driving prior to admission.  Two-level home bed and bath on main level with 2 steps to entry.  She presented on 06/15/2020 with dizziness, gait abnormality, vertigo with nausea and mild to moderate headache.  Cranial CT scan showed area of low density in the inferior cerebellar hemisphere compatible with acute infarction.  No hemorrhage.  MRI identified acute subacute nonhemorrhagic infarct involving the inferomedial left cerebellum additional acute subacute  nonhemorrhagic posterior cortical infarct in the medial right cerebellum.  Patient did not receive TPA.  CT angiogram of head and neck high-grade stenosis at the origin of the left vertebral artery with occlusion less than 1 cm from the origin.  The vessel is reconstituted at the level of C6.  High-grade stenosis of the origin of the left external carotid artery.  Echocardiogram pending.  Admission chemistries alcohol negative, glucose 105, creatinine 1.09, SARS coronavirus negative, hemoglobin 10.6.  Currently maintained on aspirin and Plavix for CVA prophylaxis.  Subcutaneous Lovenox for DVT prophylaxis.  Therapy evaluation completed with recommendations of physical medicine rehab consult.   Complete NIHSS TOTAL: 1   Past Medical History      Past Medical History:  Diagnosis Date  . Anemia      Iron deficinecy anemia  . Anemia 05/14/2017  . Anxiety    . Anxiety and depression 05/15/2014  . Arthritis    . Breast cancer ([place])      left ,last radiation 2'15, last chemo 8'14  . Constipation 11/13/2016  . Depression    . History of chicken pox    . History of radiation therapy 09/09/13-10/28/13    45 gray to left breast, lumpectomy cavity boosted to 63 gray  . HTN (hypertension) 11/13/2016  . Hyperglycemia 01/09/2016  . Hyperlipidemia 05/14/2017  . Hypertension    . MRSA (methicillin resistant Staphylococcus aureus) 2009    right groin area-no issues now. 04-07-14 PCR screen negative today.  . Neuropathy     . Preventative health care 11/13/2016      Family History  family history includes Alcohol abuse in her brother; Arthritis in her daughter; Breast cancer in her cousin and cousin; Breast cancer (age of onset: 66) in her paternal aunt; Cancer in her cousin, father, paternal uncle, and sister; Cervical cancer in her paternal aunt; Colon cancer in her paternal aunt; Dementia in her mother; Diabetes in her mother; Hypertension in her brother, father, and mother; Kidney cancer in her paternal grandmother; Lung cancer in her father; Memory loss in her mother; Ovarian cancer in her cousin; Seizures in her brother; Thyroid cancer in her father.   Prior Rehab/Hospitalizations:  Has the patient had prior rehab or hospitalizations prior to admission? No.  Patient did have outpatient PT 3 months ago for foot neuropathy.   Has the patient had major surgery during 100 days prior to admission? No   Current Medications    Current Facility-Administered Medications:  .  acetaminophen (TYLENOL) tablet 650 mg, 650 mg, Oral, Q4H PRN **OR** acetaminophen (TYLENOL) 160 MG/5ML solution 650 mg, 650 mg, Per Tube, Q4H PRN **OR** acetaminophen (TYLENOL) suppository 650 mg, 650 mg, Rectal, Q4H PRN, Rise Patience, MD .  anastrozole (ARIMIDEX) tablet 1 mg, 1 mg, Oral, Daily, Heard, Courtney S, NP .  aspirin EC tablet 325 mg, 325 mg, Oral, Daily, Rosalin Hawking, MD, 325 mg at 06/17/20 0955 .  atorvastatin (LIPITOR) tablet 40 mg, 40 mg, Oral, Daily, Rosalin Hawking, MD, 40 mg at 06/17/20 0955 .  clopidogrel (PLAVIX) tablet 75 mg, 75 mg, Oral, Daily, Rosalin Hawking, MD, 75 mg at 06/17/20 0955 .  enoxaparin (LOVENOX) injection 40 mg, 40 mg, Subcutaneous, Q24H, Rise Patience, MD, 40 mg at 06/17/20 0610 .  gabapentin (NEURONTIN) capsule 700 mg, 700 mg, Oral, BID, Rise Patience, MD, 700 mg at 06/17/20 0955 .  HYDROcodone-acetaminophen (NORCO) 10-325 MG per tablet 1 tablet, 1 tablet, Oral, Q6H PRN, Rise Patience,  MD, 1 tablet at  06/17/20 0954 .  irbesartan (AVAPRO) tablet 300 mg, 300 mg, Oral, Daily, Rise Patience, MD, 300 mg at 06/17/20 0955 .  LORazepam (ATIVAN) injection 0.5 mg, 0.5 mg, Intravenous, Once PRN, Shela Leff, MD .  lurasidone (LATUDA) tablet 40 mg, 40 mg, Oral, QHS, Rise Patience, MD, 40 mg at 06/16/20 2100 .  traZODone (DESYREL) tablet 50 mg, 50 mg, Oral, QHS, Rise Patience, MD, 50 mg at 06/16/20 2100   Patients Current Diet:     Diet Order                      Diet Heart Room service appropriate? Yes; Fluid consistency: Thin  Diet effective now                      Precautions / Restrictions Precautions Precautions: Fall, Other (comment) Precaution Comments: severe central vestibular impairment Restrictions Weight Bearing Restrictions: No    Has the patient had 2 or more falls or a fall with injury in the past year?Yes   Prior Activity Level Community (5-7x/wk): Went out daily, was driving, is on disability   Prior Functional Level Prior Function Level of Independence: Independent Comments: independent and driving    Self Care: Did the patient need help bathing, dressing, using the toilet or eating?  Independent   Indoor Mobility: Did the patient need assistance with walking from room to room (with or without device)? Independent   Stairs: Did the patient need assistance with internal or external stairs (with or without device)? Independent   Functional Cognition: Did the patient need help planning regular tasks such as shopping or remembering to take medications? Independent   Home Assistive Devices / Equipment Home Assistive Devices/Equipment: None Home Equipment: Cane - single point   Prior Device Use: Indicate devices/aids used by the patient prior to current illness, exacerbation or injury? None   Current Functional Level Cognition   Arousal/Alertness: Lethargic Overall Cognitive Status: Impaired/Different from  baseline Orientation Level: Oriented to person General Comments: pt with flat affect, noted slow processing and poor problem solving  Attention: Focused Focused Attention: Appears intact Memory: Impaired Memory Impairment: Storage deficit, Retrieval deficit, Decreased short term memory Awareness: Impaired Awareness Impairment: Intellectual impairment Problem Solving: Impaired Problem Solving Impairment: Verbal complex Executive Function: Writer: Impaired Organizing Impairment: Verbal complex Safety/Judgment: Appears intact    Extremity Assessment (includes Sensation/Coordination)   Upper Extremity Assessment: Overall WFL for tasks assessed  Lower Extremity Assessment: RLE deficits/detail, LLE deficits/detail RLE Deficits / Details: ankle dorsiflexors 5/5, quad 5/5, hip flexor 3+/5 RLE Sensation: decreased proprioception, history of peripheral neuropathy RLE Coordination: decreased gross motor LLE Deficits / Details: ankle dorsiflexors 5/5, quad 5/5, hip flexor 3+/5 LLE Sensation: decreased proprioception, history of peripheral neuropathy LLE Coordination: decreased gross motor     ADLs   Overall ADL's : Needs assistance/impaired Grooming: Min guard, Sitting Upper Body Bathing: Min guard, Sitting Lower Body Bathing: Sit to/from stand, Minimal assistance Upper Body Dressing : Min guard, Sitting Lower Body Dressing: Moderate assistance, Sit to/from stand Toilet Transfer: Minimal assistance, +2 for safety/equipment, Ambulation Functional mobility during ADLs: Minimal assistance, Moderate assistance, +2 for physical assistance, +2 for safety/equipment General ADL Comments: patient limited by dizziness, impaired balance       Mobility   Overal bed mobility: Needs Assistance Bed Mobility: Supine to Sit Supine to sit: Supervision, HOB elevated Sit to supine: Supervision, HOB elevated General bed mobility comments: supervision for safety  Transfers   Overall  transfer level: Needs assistance Transfers: Sit to/from Stand Sit to Stand: Min assist General transfer comment: initally unable to transition without loosing balance, constant cueing for focused gaze with min assist to power up and steady     Ambulation / Gait / Stairs / Wheelchair Mobility   Ambulation/Gait Ambulation/Gait assistance: Min assist, Mod assist Gait Distance (Feet): 16 Feet Assistive device: 2 person hand held assist Gait Pattern/deviations: Step-through pattern, Ataxic, Scissoring, Staggering left, Staggering right, Narrow base of support General Gait Details: very unsteady with gait, frequent scissoring and increased leaning to the left, poor proprioception noted as she had reduced awareness of gait pattern and how to correct. RPE 8/10. Gait velocity: decreased     Posture / Balance Dynamic Sitting Balance Sitting balance - Comments: min guard to close supervision but only within base of support Balance Overall balance assessment: Needs assistance Sitting-balance support: Feet supported Sitting balance-Leahy Scale: Fair Sitting balance - Comments: min guard to close supervision but only within base of support Standing balance support: Bilateral upper extremity supported, During functional activity Standing balance-Leahy Scale: Poor Standing balance comment: Min-ModAx2 to maintain safety and balance with gait     Special needs/care consideration Continuous Drip IV  Has saline lock and Designated visitor Daughter and husband likely        Previous Environmental health practitioner (from acute therapy documentation) Living Arrangements: Spouse/significant other  Lives With: Spouse Available Help at Discharge: Family Type of Home: House Home Layout: Two level, Bed/bath upstairs, Able to live on main level with bedroom/bathroom Alternate Level Stairs-Number of Steps: flight  Home Access: Stairs to enter Technical brewer of Steps: 2 Bathroom Shower/Tub: Tourist information centre manager: Standard Home Care Services: No Additional Comments: shower is on 2nd floor    Discharge Living Setting Plans for Discharge Living Setting: Patient's home, House, Lives with (comment) (Lives with husband.) Type of Home at Discharge: House (Note husband planning move to 1 level apartment, selling hom) Discharge Home Layout: Two level, 1/2 bath on main level, Bed/bath upstairs Alternate Level Stairs-Number of Steps: 14 Discharge Home Access: Stairs to enter Entrance Stairs-Rails: None Entrance Stairs-Number of Steps: 2 steps Discharge Bathroom Shower/Tub: Walk-in shower, Door Discharge Bathroom Toilet: Handicapped height Discharge Bathroom Accessibility: Yes How Accessible: Accessible via walker (May have to turn walker sideways to get through door) Does the patient have any problems obtaining your medications?: No   Social/Family/Support Systems Patient Roles: Spouse, Parent (Has a husband, dtr, step son, sister-in-law) Contact Information: Buford Gayler - husband - [phone] Anticipated Caregiver: Husband, dtr, sister in law Anticipated Caregiver's Contact Information: Marcia Brash - daughter - [phone] Ability/Limitations of Caregiver: Husband works PT, dtr and sister in Sports coach can assist. Caregiver Availability: 24/7 Discharge Plan Discussed with Primary Caregiver: Yes (Met with daughter and talked with spouse on phone) Is Caregiver In Agreement with Plan?: Yes Does Caregiver/Family have Issues with Lodging/Transportation while Pt is in Rehab?: No   Goals Patient/Family Goal for Rehab: PT S, OT Mod I/ S, SLP no goals Expected length of stay: 10-14 days Cultural Considerations: None Pt/Family Agrees to Admission and willing to participate: Yes Program Orientation Provided & Reviewed with Pt/Caregiver Including Roles  & Responsibilities: Yes   Decrease burden of Care through IP rehab admission: N/A   Possible need for SNF placement upon discharge: Not anticipated    Patient Condition: This patient's medical and functional status has changed since the consult dated: 8/11 in which the Rehabilitation Physician determined and documented that  the patient's condition is appropriate for intensive rehabilitative care in an inpatient rehabilitation facility. See "History of Present Illness" (above) for medical update. Functional changes are: min assist to min guard with mobility and ADLs. Patient's medical and functional status update has been discussed with the Rehabilitation physician and patient remains appropriate for inpatient rehabilitation. Will admit to inpatient rehab today.   Preadmission Screen Completed By: Retta Diones, RN, with brief updates by Shann Medal, PT, DPT, and Retta Diones, RN, 06/17/2020 12:34 PM ______________________________________________________________________   Discussed status with Dr. Ranell Patrick on 06/18/20 at 06/18/20  and received approval for admission today.   Admission Coordinator: Retta Diones, RN; Shann Medal, PT, DPT;  Retta Diones, time 4:15 PM Sudie Grumbling 06/18/20

## 2020-06-21 NOTE — Progress Notes (Signed)
[place] PHYSICAL MEDICINE & REHABILITATION PROGRESS NOTE   Subjective/Complaints: Did well with change in Gabapentin Asks for hydroxyzine for her anxiety Has family ed today- daughter will be here. Labs stable  ROS: -nausea, vomiting  Objective:   No results found. Recent Labs    06/21/20 0522  WBC 6.1  HGB 10.3*  HCT 31.3*  PLT 163   Recent Labs    06/21/20 0522  NA 141  K 4.2  CL 104  CO2 26  GLUCOSE 97  BUN 9  CREATININE 0.93  CALCIUM 9.9    Intake/Output Summary (Last 24 hours) at 06/21/2020 0937 Last data filed at 06/21/2020 0834 Gross per 24 hour  Intake 442 ml  Output --  Net 442 ml     Physical Exam: Vital Signs Blood pressure 116/67, pulse 79, temperature 98 F (36.7 C), temperature source Oral, resp. rate 17, height 5\' 2"  (1.575 m), weight 67.4 kg, last menstrual period 01/20/2013, SpO2 98 %. Physical Exam General: Alert and oriented x 3, No apparent distress HEENT: Head is normocephalic, atraumatic, PERRLA, EOMI, sclera anicteric, oral mucosa pink and moist, dentition intact, ext ear canals clear,  Neck: Supple without JVD or lymphadenopathy Heart: Reg rate and rhythm. No murmurs rubs or gallops Chest: CTA bilaterally without wheezes, rales, or rhonchi; no distress Abdomen: Soft, non-tender, non-distended, bowel sounds positive. Extremities: No clubbing, cyanosis, or edema. Pulses are 2+ Skin: Clean and intact without signs of breakdown Neuro: Patient is alert in no acute distress.  Follows commands.  Oriented x3.  Fair awareness of deficits. 5/5 strength throughout. No sensory deficit. FTN mildly slowed bilaterally. Sensation intact Psych: Pt's affect is appropriate. Pt is cooperative   Assessment/Plan: 1. Functional deficits secondary to left cerebellar artery stroke which require 3+ hours per day of interdisciplinary therapy in a comprehensive inpatient rehab setting.  Physiatrist is providing close team supervision and 24 hour management  of active medical problems listed below.  Physiatrist and rehab team continue to assess barriers to discharge/monitor patient progress toward functional and medical goals  Care Tool:  Bathing    Body parts bathed by patient: Right arm, Left arm, Chest, Abdomen, Front perineal area, Left upper leg, Right upper leg, Buttocks, Face, Left lower leg, Right lower leg         Bathing assist Assist Level: Independent with assistive device Assistive Device Comment: shower seat   Upper Body Dressing/Undressing Upper body dressing   What is the patient wearing?: Pull over shirt    Upper body assist Assist Level: Independent    Lower Body Dressing/Undressing Lower body dressing      What is the patient wearing?: Underwear/pull up, Pants     Lower body assist Assist for lower body dressing: Independent     Toileting Toileting    Toileting assist Assist for toileting: Independent     Transfers Chair/bed transfer  Transfers assist     Chair/bed transfer assist level: Independent     Locomotion Ambulation   Ambulation assist      Assist level: Independent Assistive device: No Device Max distance: 50'   Walk 10 feet activity   Assist     Assist level: Contact Guard/Touching assist Assistive device: No Device   Walk 50 feet activity   Assist    Assist level: Contact Guard/Touching assist Assistive device: No Device    Walk 150 feet activity   Assist    Assist level: Contact Guard/Touching assist Assistive device: No Device    Walk 10 feet  on uneven surface  activity   Assist     Assist level: Contact Guard/Touching assist     Wheelchair     Assist Will patient use wheelchair at discharge?: No (patient is a functional ambulator)   Wheelchair activity did not occur: N/A         Wheelchair 50 feet with 2 turns activity    Assist    Wheelchair 50 feet with 2 turns activity did not occur: N/A       Wheelchair 150 feet  activity     Assist  Wheelchair 150 feet activity did not occur: N/A       Blood pressure 116/67, pulse 79, temperature 98 F (36.7 C), temperature source Oral, resp. rate 17, height 5\' 2"  (1.575 m), weight 67.4 kg, last menstrual period 01/20/2013, SpO2 98 %.    Medical Problem List and Plan: 1.  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  Antithrombotics: -DVT/anticoagulation: Lovenox- may discontinue as ambulating 175 feet.              -antiplatelet therapy: Aspirin 325 mg daily and Plavix 25 mg daily x3 months then aspirin alone 3. Pain Management: Increase Neurontin to 600/600/900, hydrocodone as needed for chemotherapy-induced peripheral neuropathy. Tolerated well.  4. Mood: Trazodone 50 mg nightly provide emotional support             -antipsychotic agents: Latuda 40 mg nightly. Restarted hydroxyzine for anxiety.  5. Neuropsych: This patient is capable of making decisions on her own behalf. 6. Skin/Wound Care: Routine skin checks 7. Fluids/Electrolytes/Nutrition: Routine in and outs with follow-up chemistries 8.  Hypertension.  Avapro 300 mg daily.  Monitor with increased mobility. Well controlled.  9.  Hyperlipidemia.  Lipitor. Discussed that lipid panel was normal 10.  Left breast cancer.  Continue Arimidex daily   LOS: 2 days A FACE TO FACE EVALUATION WAS PERFORMED  Clide Deutscher Rayquon Uselman 06/21/2020, 9:37 AM

## 2020-06-21 NOTE — Progress Notes (Signed)
Physical Medicine and Rehabilitation Consult Reason for Consult: Dizziness with decreased functional mobility Referring Physician: [facility]     HPI: Kristi [name] is a 64 y.o. right-handed female with history of anxiety, left breast cancer with radiation chemotherapy, hyperlipidemia, hypertension.  History taken from chart review and patient. Patient lives with spouse.  Independent driving prior to admission.  Two-level home bed and bath on main level with 2 steps to entry.  She presented on 06/15/2020 with dizziness, gait abnormality, vertigo with nausea and mild to moderate headache.  Cranial CT scan showed area of low density in the inferior cerebellar hemisphere compatible with acute infarction.  No hemorrhage.  MRI identified acute subacute nonhemorrhagic infarct involving the inferomedial left cerebellum additional acute subacute nonhemorrhagic posterior cortical infarct in the medial right cerebellum.  Patient did not receive TPA.  CT angiogram of head and neck high-grade stenosis at the origin of the left vertebral artery with occlusion less than 1 cm from the origin.  The vessel is reconstituted at the level of C6.  High-grade stenosis of the origin of the left external carotid artery.  Echocardiogram pending.  Admission chemistries alcohol negative, glucose 105, creatinine 1.09, SARS coronavirus negative, hemoglobin 10.6.  Currently maintained on aspirin and Plavix for CVA prophylaxis.  Subcutaneous Lovenox for DVT prophylaxis.  Therapy evaluation completed with recommendations of physical medicine rehab consult.     Review of Systems  Constitutional: Negative for chills and fever.  HENT: Negative for hearing loss.   Eyes: Negative for blurred vision and double vision.  Respiratory: Negative for cough and shortness of breath.   Cardiovascular: Negative for chest pain, palpitations and leg swelling.  Gastrointestinal: Positive for constipation. Negative for heartburn, nausea and  vomiting.  Genitourinary: Negative for dysuria, flank pain and hematuria.  Musculoskeletal: Positive for myalgias.  Skin: Negative for rash.  Neurological: Positive for dizziness and headaches. Negative for sensory change, speech change, focal weakness and weakness.  Psychiatric/Behavioral: Positive for depression. The patient has insomnia.        Anxiety  All other systems reviewed and are negative.       Past Medical History:  Diagnosis Date  . Anemia      Iron deficinecy anemia  . Anemia 05/14/2017  . Anxiety    . Anxiety and depression 05/15/2014  . Arthritis    . Breast cancer ([place])      left ,last radiation 2'15, last chemo 8'14  . Constipation 11/13/2016  . Depression    . History of chicken pox    . History of radiation therapy 09/09/13-10/28/13    45 gray to left breast, lumpectomy cavity boosted to 63 gray  . HTN (hypertension) 11/13/2016  . Hyperglycemia 01/09/2016  . Hyperlipidemia 05/14/2017  . Hypertension    . MRSA (methicillin resistant Staphylococcus aureus) 2009    right groin area-no issues now. 04-07-14 PCR screen negative today.  . Neuropathy    . Preventative health care 11/13/2016         Past Surgical History:  Procedure Laterality Date  . ANAL SPHINCTEROTOMY   04/2011  . APPENDECTOMY   1980  . AXILLARY LYMPH NODE DISSECTION Left 02/04/2013    Procedure: LEFT AXILLARY LYMPH NODE DISSECTION;  Surgeon: Stark Klein, MD;  Location: [facility];  Service: General;  Laterality: Left;  End: 5284  . BREAST LUMPECTOMY WITH NEEDLE LOCALIZATION Left 02/04/2013    Procedure: LEFT BREAST LUMPECTOMY WITH NEEDLE LOCALIZATION;  Surgeon: Stark Klein, MD;  Location: [facility];  Service: General;  Laterality: Left;  . BREAST SURGERY        Lumpectomy in april 2014  . HEMORRHOID SURGERY   04/2011    ligation  . MASTECTOMY Left 02/15/2017  . PORT-A-CATH REMOVAL N/A 04/16/2014    Procedure: REMOVAL PORT-A-CATH;  Surgeon: Stark Klein, MD;  Location: [facility];  Service: General;  Laterality: N/A;   . PORTACATH PLACEMENT Right 02/04/2013    Procedure: INSERTION PORT-A-CATH;  Surgeon: Stark Klein, MD;  Location: [facility];  Service: General;  Laterality: Right;  Start Time: 5053.  Marland Kitchen SHOULDER ARTHROSCOPY WITH ROTATOR CUFF REPAIR AND SUBACROMIAL DECOMPRESSION Left 02/24/2014    Procedure: SHOULDER ARTHROSCOPY WITH ROTATOR CUFF REPAIR AND SUBACROMIAL DECOMPRESSION;  Surgeon: Meredith Pel, MD;  Location: [facility];  Service: Orthopedics;  Laterality: Left;  LEFT SHOULDER DIAGNOSTIC OPERATIVE ARTHROSCOPY, SUBACROMIAL DECOMPRESSION, ROTATOR CUFF TEAR REPAIR  . SIMPLE MASTECTOMY WITH AXILLARY SENTINEL NODE BIOPSY Left 02/15/2017    Procedure: LEFT MASTECTOMY;  Surgeon: Stark Klein, MD;  Location: [facility];  Service: General;  Laterality: Left;  . TOTAL MASTECTOMY Right 12/26/2018    Procedure: RIGHT BREAST PROPHYLATIC MASTECTOMY;  Surgeon: Stark Klein, MD;  Location: [facility];  Service: General;  Laterality: Right;         Family History  Problem Relation Age of Onset  . Lung cancer Father    . Hypertension Father    . Thyroid cancer Father          dx in his 19s  . Cancer Father          lung, thyroid, smoker  . Breast cancer Paternal Aunt 18  . Colon cancer Paternal Aunt          dx in her 50x  . Cervical cancer Paternal Aunt          dzx in her 31s  . Ovarian cancer Cousin          dx in her lage 13s  . Breast cancer Cousin          maternal first cousin, once removed; dx in her late 32s  . Breast cancer Cousin          maternal first cousin once removed; dx in late 65s  . Hypertension Mother    . Diabetes Mother    . Dementia Mother    . Memory loss Mother    . Hypertension Brother    . Seizures Brother          Alcohol induced.  . Alcohol abuse Brother          drinker, smoker  . Cancer Paternal Uncle          oral cancer  . Kidney cancer Paternal Grandmother    . Arthritis Daughter          back surgery  . Cancer Cousin          several paternal cousins with brain cancer,  leukemia, and other cancers  . Cancer Sister          stomach    Social History:  reports that she has never smoked. She has never used smokeless tobacco. She reports that she does not drink alcohol and does not use drugs. Allergies: No Known Allergies       Medications Prior to Admission  Medication Sig Dispense Refill  . anastrozole (ARIMIDEX) 1 MG tablet Take 1 tablet (1 mg total) by mouth daily. TAKE 1 TABLET (1 MG) BY MOUTH  DAILY IN THE MORNING (Patient taking differently: Take 1 mg by mouth daily. ) 90 tablet 3  . Apoaequorin (PREVAGEN) 10 MG CAPS Take 1 capsule by mouth daily.      Marland Kitchen aspirin EC 81 MG tablet Take 1 tablet (81 mg total) by mouth daily.      Marland Kitchen gabapentin (NEURONTIN) 800 MG tablet Take 1 tablet (800 mg total) by mouth 3 (three) times daily. 90 tablet 3  . HYDROcodone-acetaminophen (NORCO) 10-325 MG tablet Take 1 tablet by mouth every 6 (six) hours as needed. (Patient taking differently: Take 1 tablet by mouth every 6 (six) hours as needed for moderate pain or severe pain. ) 180 tablet 0  . hydrOXYzine (ATARAX/VISTARIL) 50 MG tablet Take 50 mg by mouth 2 (two) times daily.      Marland Kitchen LATUDA 80 MG TABS tablet Take 0.5 tablets (40 mg total) by mouth at bedtime. 30 tablet 1  . Magnesium 250 MG TABS Take 1 tablet by mouth daily.      . Multiple Vitamin (MULTIVITAMIN WITH MINERALS) TABS tablet Take 1 tablet by mouth daily.      . traZODone (DESYREL) 50 MG tablet Take 50 mg by mouth at bedtime.       . valsartan-hydrochlorothiazide (DIOVAN-HCT) 320-25 MG tablet TAKE 1 TABLET BY MOUTH EVERY DAY IN THE MORNING (Patient taking differently: Take 1 tablet by mouth daily. ) 90 tablet 1  . KLOR-CON M20 20 MEQ tablet TAKE 1 TABLET BY MOUTH EVERY DAY (Patient not taking: Reported on 06/16/2020) 90 tablet 1      Home: Home Living Family/patient expects to be discharged to:: Private residence Living Arrangements: Spouse/significant other Available Help at Discharge: Family Type of Home:  House Home Access: Stairs to enter Technical brewer of Steps: 2 Home Layout: Two level, Bed/bath upstairs, Able to live on main level with bedroom/bathroom Alternate Level Stairs-Number of Steps: flight  Bathroom Shower/Tub: Multimedia programmer: Standard Home Equipment: Radio producer - single point Additional Comments: shower is on 2nd floor   Lives With: Spouse  Functional History: Prior Function Level of Independence: Independent Comments: independent and driving  Functional Status:  Mobility: Bed Mobility Overal bed mobility: Needs Assistance Bed Mobility: Supine to Sit Supine to sit: Supervision, HOB elevated Sit to supine: Supervision, HOB elevated General bed mobility comments: supervision for safety  Transfers Overall transfer level: Needs assistance Transfers: Sit to/from Stand Sit to Stand: Min assist General transfer comment: initally unable to transition without loosing balance, constant cueing for focused gaze with min assist to power up and steady Ambulation/Gait Ambulation/Gait assistance: Min assist, Mod assist Gait Distance (Feet): 16 Feet Assistive device: 2 person hand held assist Gait Pattern/deviations: Step-through pattern, Ataxic, Scissoring, Staggering left, Staggering right, Narrow base of support General Gait Details: very unsteady with gait, frequent scissoring and increased leaning to the left, poor proprioception noted as she had reduced awareness of gait pattern and how to correct. RPE 8/10. Gait velocity: decreased   ADL: ADL Overall ADL's : Needs assistance/impaired Grooming: Min guard, Sitting Upper Body Bathing: Min guard, Sitting Lower Body Bathing: Sit to/from stand, Minimal assistance Upper Body Dressing : Min guard, Sitting Lower Body Dressing: Moderate assistance, Sit to/from stand Toilet Transfer: Minimal assistance, +2 for safety/equipment, Ambulation Functional mobility during ADLs: Minimal assistance, Moderate assistance,  +2 for physical assistance, +2 for safety/equipment General ADL Comments: patient limited by dizziness, impaired balance     Cognition: Cognition Overall Cognitive Status: Impaired/Different from baseline Arousal/Alertness: Lethargic Orientation Level: Oriented X4  Attention: Focused Focused Attention: Appears intact Memory: Impaired Memory Impairment: Storage deficit, Retrieval deficit, Decreased short term memory Awareness: Impaired Awareness Impairment: Intellectual impairment Problem Solving: Impaired Problem Solving Impairment: Verbal complex Executive Function: Writer: Impaired Organizing Impairment: Verbal complex Safety/Judgment: Appears intact Cognition Arousal/Alertness: Awake/alert Behavior During Therapy: Flat affect Overall Cognitive Status: Impaired/Different from baseline Area of Impairment: Awareness, Problem solving Awareness: Emergent Problem Solving: Slow processing, Decreased initiation, Difficulty sequencing, Requires verbal cues General Comments: pt with flat affect, noted slow processing and poor problem solving    Blood pressure 109/74, pulse 74, temperature 98.7 F (37.1 C), temperature source Oral, resp. rate 17, height 5\' 2"  (1.575 m), weight 72.2 kg, last menstrual period 01/20/2013, SpO2 99 %. Physical Exam Vitals reviewed.  Constitutional:      General: She is not in acute distress.    Appearance: She is not ill-appearing.  HENT:     Head: Normocephalic and atraumatic.     Right Ear: External ear normal.     Left Ear: External ear normal.     Nose: Nose normal.  Eyes:     General:        Right eye: No discharge.        Left eye: No discharge.     Extraocular Movements: Extraocular movements intact.  Cardiovascular:     Rate and Rhythm: Normal rate.  Pulmonary:     Effort: Pulmonary effort is normal. No respiratory distress.     Breath sounds: No stridor.  Abdominal:     General: Abdomen is flat. Bowel sounds are normal.  There is no distension.  Musculoskeletal:     Comments: No edema or tenderness in extremities  Skin:    General: Skin is warm and dry.  Neurological:     Mental Status: She is alert.     Comments: Alert and oriented Fair awareness of deficits. Motor: Grossly 4+/5 throughout No ataxia bilateral lower extremities  Psychiatric:        Mood and Affect: Mood normal.        Behavior: Behavior normal.        Thought Content: Thought content normal.        Lab Results Last 24 Hours       Results for orders placed or performed during the hospital encounter of 06/15/20 (from the past 24 hour(s))  CBG monitoring, ED     Status: Abnormal    Collection Time: 06/15/20  5:00 PM  Result Value Ref Range    Glucose-Capillary 122 (H) 70 - 99 mg/dL  Ethanol     Status: None    Collection Time: 06/15/20  7:29 PM  Result Value Ref Range    Alcohol, Ethyl (B) <10 <10 mg/dL  Protime-INR     Status: None    Collection Time: 06/15/20  7:29 PM  Result Value Ref Range    Prothrombin Time 13.0 11.4 - 15.2 seconds    INR 1.0 0.8 - 1.2  APTT     Status: None    Collection Time: 06/15/20  7:29 PM  Result Value Ref Range    aPTT 27 24 - 36 seconds  CBC     Status: Abnormal    Collection Time: 06/15/20  7:29 PM  Result Value Ref Range    WBC 6.5 4.0 - 10.5 K/uL    RBC 4.17 3.87 - 5.11 MIL/uL    Hemoglobin 11.5 (L) 12.0 - 15.0 g/dL    HCT 35.7 (L) 36 - 46 %  MCV 85.6 80.0 - 100.0 fL    MCH 27.6 26.0 - 34.0 pg    MCHC 32.2 30.0 - 36.0 g/dL    RDW 13.9 11.5 - 15.5 %    Platelets 183 150 - 400 K/uL    nRBC 0.0 0.0 - 0.2 %  Differential     Status: None    Collection Time: 06/15/20  7:29 PM  Result Value Ref Range    Neutrophils Relative % 47 %    Neutro Abs 3.1 1.7 - 7.7 K/uL    Lymphocytes Relative 45 %    Lymphs Abs 2.9 0.7 - 4.0 K/uL    Monocytes Relative 7 %    Monocytes Absolute 0.4 0 - 1 K/uL    Eosinophils Relative 1 %    Eosinophils Absolute 0.0 0 - 0 K/uL    Basophils Relative 0 %     Basophils Absolute 0.0 0 - 0 K/uL    Immature Granulocytes 0 %    Abs Immature Granulocytes 0.01 0.00 - 0.07 K/uL  Comprehensive metabolic panel     Status: Abnormal    Collection Time: 06/15/20  7:29 PM  Result Value Ref Range    Sodium 137 135 - 145 mmol/L    Potassium 4.1 3.5 - 5.1 mmol/L    Chloride 99 98 - 111 mmol/L    CO2 29 22 - 32 mmol/L    Glucose, Bld 105 (H) 70 - 99 mg/dL    BUN 14 8 - 23 mg/dL    Creatinine, Ser 1.09 (H) 0.44 - 1.00 mg/dL    Calcium 10.4 (H) 8.9 - 10.3 mg/dL    Total Protein 7.5 6.5 - 8.1 g/dL    Albumin 4.2 3.5 - 5.0 g/dL    AST 24 15 - 41 U/L    ALT 23 0 - 44 U/L    Alkaline Phosphatase 71 38 - 126 U/L    Total Bilirubin 0.4 0.3 - 1.2 mg/dL    GFR calc non Af Amer 53 (L) >60 mL/min    GFR calc Af Amer >60 >60 mL/min    Anion gap 9 5 - 15  SARS Coronavirus 2 by RT PCR (hospital order, performed in [facility] hospital lab) Nasopharyngeal Nasopharyngeal Swab     Status: None    Collection Time: 06/15/20  7:29 PM    Specimen: Nasopharyngeal Swab  Result Value Ref Range    SARS Coronavirus 2 NEGATIVE NEGATIVE  HIV Antibody (routine testing w rflx)     Status: None    Collection Time: 06/16/20  8:01 AM  Result Value Ref Range    HIV Screen 4th Generation wRfx Non Reactive Non Reactive  Hemoglobin A1c     Status: Abnormal    Collection Time: 06/16/20  8:01 AM  Result Value Ref Range    Hgb A1c MFr Bld 5.9 (H) 4.8 - 5.6 %    Mean Plasma Glucose 122.63 mg/dL  CBC     Status: Abnormal    Collection Time: 06/16/20  8:01 AM  Result Value Ref Range    WBC 6.1 4.0 - 10.5 K/uL    RBC 3.86 (L) 3.87 - 5.11 MIL/uL    Hemoglobin 10.6 (L) 12.0 - 15.0 g/dL    HCT 33.0 (L) 36 - 46 %    MCV 85.5 80.0 - 100.0 fL    MCH 27.5 26.0 - 34.0 pg    MCHC 32.1 30.0 - 36.0 g/dL    RDW 14.1 11.5 - 15.5 %  Platelets 161 150 - 400 K/uL    nRBC 0.0 0.0 - 0.2 %  Creatinine, serum     Status: Abnormal    Collection Time: 06/16/20  8:01 AM  Result Value Ref Range     Creatinine, Ser 1.19 (H) 0.44 - 1.00 mg/dL    GFR calc non Af Amer 48 (L) >60 mL/min    GFR calc Af Amer 55 (L) >60 mL/min  Lipid panel     Status: None    Collection Time: 06/16/20  8:01 AM  Result Value Ref Range    Cholesterol 144 0 - 200 mg/dL    Triglycerides 68 <150 mg/dL    HDL 41 >40 mg/dL    Total CHOL/HDL Ratio 3.5 RATIO    VLDL 14 0 - 40 mg/dL    LDL Cholesterol 89 0 - 99 mg/dL       Imaging Results (Last 48 hours)  CT ANGIO HEAD W OR WO CONTRAST   Result Date: 06/16/2020 CLINICAL DATA:  Cerebellar infarct.  Headache and dizziness. EXAM: CT ANGIOGRAPHY HEAD AND NECK TECHNIQUE: Multidetector CT imaging of the head and neck was performed using the standard protocol during bolus administration of intravenous contrast. Multiplanar CT image reconstructions and MIPs were obtained to evaluate the vascular anatomy. Carotid stenosis measurements (when applicable) are obtained utilizing NASCET criteria, using the distal internal carotid diameter as the denominator. CONTRAST:  23mL OMNIPAQUE IOHEXOL 350 MG/ML SOLN COMPARISON:  CT head without contrast 06/15/2020. MR head without contrast 06/16/2020. FINDINGS: CT HEAD FINDINGS Brain: Acute/subacute nonhemorrhagic infarct is again noted in the inferomedial left cerebellum and posteromedial right cerebellum. No new infarcts are present. No acute hemorrhage is present. Supratentorial structures are within normal limits. Ventricles are normal. No significant extraaxial fluid collection is present. Vascular: Atherosclerotic changes are present within the cavernous internal carotid arteries bilaterally without a hyperdense vessel. Skull: Calvarium is intact. No focal lytic or blastic lesions are present. No significant extracranial soft tissue lesion is present. Sinuses: The paranasal sinuses and mastoid air cells are clear. Orbits: The globes and orbits are within normal limits. Review of the MIP images confirms the above findings CTA NECK FINDINGS  Aortic arch: A 3 vessel arch configuration is present. Minimal atherosclerotic changes present without stenosis or aneurysm. Right carotid system: The right common carotid artery is within normal limits. Minimal irregularity is present at the bifurcation without significant stenosis. Subtle narrowing is present in the mid cervical right ICA without significant stenosis. Minimal vessel wall irregularity is present. Left carotid system: The left common carotid artery is within normal limits. The bifurcation is unremarkable. The proximal left ICA is normal. High-grade stenosis is present in the proximal left external carotid artery. There is some vessel wall irregularity in the mid cervical left ICA without significant stenosis. Vertebral arteries: The left vertebral artery is the dominant vessel. Scratched at the right vertebral artery is the dominant vessel, originating from the subclavian artery. High-grade stenosis present at the origin of the left vertebral artery with occlusion less than 1 cm from the origin. The vessel is reconstituted at the level of C6. A hypoplastic left vertebral artery demonstrates no other significant stenoses or occlusion. No significant stenosis is present in the cervical right vertebral artery. Skeleton: Degenerative changes of the cervical spine are most evident at C4-5, C5-6, and C6-7 with uncovertebral spurring and reversal of the normal cervical lordosis. No focal lytic or blastic lesions are present. Vertebral body heights are maintained. Other neck: The soft tissues of the  neck are otherwise within normal limits. Upper chest: Lung apices are clear. Thoracic inlet is within normal limits. Review of the MIP images confirms the above findings CTA HEAD FINDINGS Anterior circulation: Minimal atherosclerotic changes are present within the cavernous internal carotid arteries bilaterally without stenosis. Internal carotid arteries are otherwise within normal limits through the ICA  termini. The A1 and M1 segments are normal. The anterior communicating artery is patent. MCA bifurcations are normal. ACA and MCA branch vessels are within normal limits. Posterior circulation: The right vertebral artery is the dominant vessel. PICA origins are visualized and normal. Hypoplastic V4 segment extends to the vertebrobasilar junction on the left. The basilar artery is normal. Both posterior cerebral arteries originate from the basilar tip. The PCA branch vessels are within normal limits. Venous sinuses: The dural sinuses are patent. The straight sinus and deep cerebral veins are patent. The right transverse sinus is dominant. Cortical veins are unremarkable. Anatomic variants: None Review of the MIP images confirms the above findings IMPRESSION: 1. High-grade stenosis at the origin of the left vertebral artery with occlusion less than 1 cm from the origin. The vessel is reconstituted at the level of C6. 2. The right vertebral artery is dominant. 3. High-grade stenosis at the origin of the left external carotid artery. 4. Minimal atherosclerotic changes at the right carotid bifurcation and cavernous internal carotid arteries bilaterally without significant stenosis. 5. Minimal vessel wall irregularity in the mid cervical internal carotid arteries bilaterally without significant stenosis. This may represent fibromuscular dysplasia. 6. Multilevel spondylosis of the cervical spine. Electronically Signed   By: San Morelle M.D.   On: 06/16/2020 06:58    CT Head Wo Contrast   Result Date: 06/15/2020 CLINICAL DATA:  Dizziness EXAM: CT HEAD WITHOUT CONTRAST TECHNIQUE: Contiguous axial images were obtained from the base of the skull through the vertex without intravenous contrast. COMPARISON:  MRI 10/08/2019 FINDINGS: Brain: There is area of low-density noted in the inferior left cerebellar hemisphere compatible with acute infarct. Subtle low-density noted in the left temporal, parietal and occipital  lobes which I favor is artifactual, but given the acute infarct in the left cerebellum, cannot completely exclude infarct. No hemorrhage or hydrocephalus. Vascular: No hyperdense vessel or unexpected calcification. Skull: No acute calvarial abnormality. Sinuses/Orbits: Visualized paranasal sinuses and mastoids clear. Orbital soft tissues unremarkable. Other: None IMPRESSION: Area of low-density in the inferior left cerebellar hemisphere compatible with acute infarction. Subtle decreased attenuation throughout much of the left temporal, parietal and occipital lobes which I favor is artifactual. No hemorrhage. Electronically Signed   By: Rolm Baptise M.D.   On: 06/15/2020 18:35    CT ANGIO NECK W OR WO CONTRAST   Result Date: 06/16/2020 CLINICAL DATA:  Cerebellar infarct.  Headache and dizziness. EXAM: CT ANGIOGRAPHY HEAD AND NECK TECHNIQUE: Multidetector CT imaging of the head and neck was performed using the standard protocol during bolus administration of intravenous contrast. Multiplanar CT image reconstructions and MIPs were obtained to evaluate the vascular anatomy. Carotid stenosis measurements (when applicable) are obtained utilizing NASCET criteria, using the distal internal carotid diameter as the denominator. CONTRAST:  71mL OMNIPAQUE IOHEXOL 350 MG/ML SOLN COMPARISON:  CT head without contrast 06/15/2020. MR head without contrast 06/16/2020. FINDINGS: CT HEAD FINDINGS Brain: Acute/subacute nonhemorrhagic infarct is again noted in the inferomedial left cerebellum and posteromedial right cerebellum. No new infarcts are present. No acute hemorrhage is present. Supratentorial structures are within normal limits. Ventricles are normal. No significant extraaxial fluid collection is present. Vascular: Atherosclerotic  changes are present within the cavernous internal carotid arteries bilaterally without a hyperdense vessel. Skull: Calvarium is intact. No focal lytic or blastic lesions are present. No  significant extracranial soft tissue lesion is present. Sinuses: The paranasal sinuses and mastoid air cells are clear. Orbits: The globes and orbits are within normal limits. Review of the MIP images confirms the above findings CTA NECK FINDINGS Aortic arch: A 3 vessel arch configuration is present. Minimal atherosclerotic changes present without stenosis or aneurysm. Right carotid system: The right common carotid artery is within normal limits. Minimal irregularity is present at the bifurcation without significant stenosis. Subtle narrowing is present in the mid cervical right ICA without significant stenosis. Minimal vessel wall irregularity is present. Left carotid system: The left common carotid artery is within normal limits. The bifurcation is unremarkable. The proximal left ICA is normal. High-grade stenosis is present in the proximal left external carotid artery. There is some vessel wall irregularity in the mid cervical left ICA without significant stenosis. Vertebral arteries: The left vertebral artery is the dominant vessel. Scratched at the right vertebral artery is the dominant vessel, originating from the subclavian artery. High-grade stenosis present at the origin of the left vertebral artery with occlusion less than 1 cm from the origin. The vessel is reconstituted at the level of C6. A hypoplastic left vertebral artery demonstrates no other significant stenoses or occlusion. No significant stenosis is present in the cervical right vertebral artery. Skeleton: Degenerative changes of the cervical spine are most evident at C4-5, C5-6, and C6-7 with uncovertebral spurring and reversal of the normal cervical lordosis. No focal lytic or blastic lesions are present. Vertebral body heights are maintained. Other neck: The soft tissues of the neck are otherwise within normal limits. Upper chest: Lung apices are clear. Thoracic inlet is within normal limits. Review of the MIP images confirms the above  findings CTA HEAD FINDINGS Anterior circulation: Minimal atherosclerotic changes are present within the cavernous internal carotid arteries bilaterally without stenosis. Internal carotid arteries are otherwise within normal limits through the ICA termini. The A1 and M1 segments are normal. The anterior communicating artery is patent. MCA bifurcations are normal. ACA and MCA branch vessels are within normal limits. Posterior circulation: The right vertebral artery is the dominant vessel. PICA origins are visualized and normal. Hypoplastic V4 segment extends to the vertebrobasilar junction on the left. The basilar artery is normal. Both posterior cerebral arteries originate from the basilar tip. The PCA branch vessels are within normal limits. Venous sinuses: The dural sinuses are patent. The straight sinus and deep cerebral veins are patent. The right transverse sinus is dominant. Cortical veins are unremarkable. Anatomic variants: None Review of the MIP images confirms the above findings IMPRESSION: 1. High-grade stenosis at the origin of the left vertebral artery with occlusion less than 1 cm from the origin. The vessel is reconstituted at the level of C6. 2. The right vertebral artery is dominant. 3. High-grade stenosis at the origin of the left external carotid artery. 4. Minimal atherosclerotic changes at the right carotid bifurcation and cavernous internal carotid arteries bilaterally without significant stenosis. 5. Minimal vessel wall irregularity in the mid cervical internal carotid arteries bilaterally without significant stenosis. This may represent fibromuscular dysplasia. 6. Multilevel spondylosis of the cervical spine. Electronically Signed   By: San Morelle M.D.   On: 06/16/2020 06:58    MR BRAIN WO CONTRAST   Result Date: 06/16/2020 CLINICAL DATA:  Episode of dizziness and mild-to-moderate headache 2 days ago. Nausea.  EXAM: MRI HEAD WITHOUT CONTRAST TECHNIQUE: Multiplanar, multiecho pulse  sequences of the brain and surrounding structures were obtained without intravenous contrast. COMPARISON:  MR head without contrast scratched at CT head without contrast 06/15/2020. MR head without contrast 10/08/2019 FINDINGS: Brain: Acute/subacute nonhemorrhagic infarct is confirmed in the inferomedial left cerebellum. Additional posterior cortical infarct is present in the medial right cerebellum. Infarct involves the inferior vermis scattered more superior posterior infarcts. No acute or subacute supratentorial infarct is present. T2 signal changes are associated with the areas of acute/subacute infarct. No significant supratentorial white matter disease is present. The ventricles are of normal size. No significant extraaxial fluid collection is present. Vascular: Prominent vein extends into the superior vermis, likely DVA. Flow is present in the major intracranial arteries. No other venous abnormalities are present. Skull and upper cervical spine: Mild multilevel degenerative changes are present in the upper cervical spine, most prominent at C3-4. Marrow signal is normal. Vertebral body heights are normal. Craniocervical junction is normal. Midline structures are unremarkable. Sinuses/Orbits: The paranasal sinuses and mastoid air cells are clear. The globes and orbits are within normal limits. IMPRESSION: 1. Acute/subacute nonhemorrhagic infarct involving the inferomedial left cerebellum. 2. Additional acute/subacute nonhemorrhagic posterior cortical infarct in the medial right cerebellum. 3. No acute or subacute supratentorial infarct. Electronically Signed   By: San Morelle M.D.   On: 06/16/2020 06:44       Assessment/Plan: Diagnosis: Inferomedial left cerebellum infarct with additional nonhemorrhagic posterior cortical infarct in the medial right cerebellum.   Labs independently reviewed.  Records reviewed and summated above.   1. Does the need for close, 24 hr/day medical supervision in  concert with the patient's rehab needs make it unreasonable for this patient to be served in a less intensive setting? Potentially  2. Co-Morbidities requiring supervision/potential complications: anxiety (ensure anxiety and resulting apprehension do not limit functional progress; consider prn medications if warranted), left breast cancer with radiation chemotherapy, hyperlipidemia, HTN (monitor and provide prns in accordance with increased physical exertion and pain), ABLA (repeat labs, consider transfusion if necessary to ensure appropriate perfusion for increased activity tolerance) 3. Due to safety, disease management and patient education, does the patient require 24 hr/day rehab nursing? Potentially 4. Does the patient require coordinated care of a physician, rehab nurse, therapy disciplines of PT/OT to address physical and functional deficits in the context of the above medical diagnosis(es)? Potentially Addressing deficits in the following areas: balance, endurance, locomotion, strength, transferring, toileting and psychosocial support 5. Can the patient actively participate in an intensive therapy program of at least 3 hrs of therapy per day at least 5 days per week? Yes 6. The potential for patient to make measurable gains while on inpatient rehab is excellent 7. Anticipated functional outcomes upon discharge from inpatient rehab are supervision  with PT, modified independent and supervision with OT, n/a with SLP. 8. Estimated rehab length of stay to reach the above functional goals is: 10-14 days. 9. Anticipated discharge destination: Home 10. Overall Rehab/Functional Prognosis: good   RECOMMENDATIONS: This patient's condition is appropriate for continued rehabilitative care in the following setting: CIR after completion of medical work-up. Patient has agreed to participate in recommended program. Yes Note that insurance prior authorization may be required for reimbursement for recommended  care.   Comment: Rehab Admissions Coordinator to follow up.   I have personally performed a face to face diagnostic evaluation, including, but not limited to relevant history and physical exam findings, of this patient and developed relevant assessment and  plan.  Additionally, I have reviewed and concur with the physician assistant's documentation above.    Delice Lesch, MD, ABPMR Lavon Paganini Angiulli, PA-C 06/16/2020

## 2020-06-21 NOTE — Progress Notes (Signed)
Inpatient [facility] Individual Statement of Services  Patient Name:  Kristi [name]  Date:  06/21/2020  Welcome to the [facility].  Our goal is to provide you with an individualized program based on your diagnosis and situation, designed to meet your specific needs.  With this comprehensive rehabilitation program, you will be expected to participate in at least 3 hours of rehabilitation therapies Monday-Friday, with modified therapy programming on the weekends.  Your rehabilitation program will include the following services:  Physical Therapy (PT), Occupational Therapy (OT), Speech Therapy (ST), 24 hour per day rehabilitation nursing, Therapeutic Recreaction (TR), Neuropsychology, Care Coordinator, Rehabilitation Medicine, Nutrition Services, Pharmacy Services and Other  Weekly team conferences will be held on Wednesdays to discuss your progress.  Your Inpatient Rehabilitation Care Coordinator will talk with you frequently to get your input and to update you on team discussions.  Team conferences with you and your family in attendance may also be held.  Expected length of stay: 10-14 Days  Overall anticipated outcome: MOD I  Depending on your progress and recovery, your program may change. Your Inpatient Rehabilitation Care Coordinator will coordinate services and will keep you informed of any changes. Your Inpatient Rehabilitation Care Coordinator's name and contact numbers are listed  below.  The following services may also be recommended but are not provided by the [facility]:    [place] will be made to provide these services after discharge if needed.  Arrangements include referral to agencies that provide these services.  Your insurance has been verified to be:  [employer]  Your primary doctor is:  [name], MD  Pertinent information will  be shared with your doctor and your insurance company.  Inpatient Rehabilitation Care Coordinator:  Erlene Quan, [place] or [phone]  Information discussed with and copy given to patient by: Dyanne Iha, 06/21/2020, 12:02 PM

## 2020-06-21 NOTE — Progress Notes (Signed)
Patient ID: Kristi [name], female   DOB: Feb 08, 1956, 64 y.o.   MRN: [id number]   Patient follow up referral faxed to [facility].

## 2020-06-21 NOTE — Progress Notes (Signed)
Patient Details  Name: Kristi [name] MRN: [id number] Date of Birth: [birth year]  Today's Date: 06/21/2020  Hospital Problems: Principal Problem:   Cerebral infarction involving left cerebellar artery [facility])  Past Medical History:  Past Medical History:  Diagnosis Date  . Anemia    Iron deficinecy anemia  . Anemia 05/14/2017  . Anxiety   . Anxiety and depression 05/15/2014  . Arthritis   . Breast cancer ([place])    left ,last radiation 2'15, last chemo 8'14  . Constipation 11/13/2016  . Depression   . History of chicken pox   . History of radiation therapy 09/09/13-10/28/13   45 gray to left breast, lumpectomy cavity boosted to 63 gray  . HTN (hypertension) 11/13/2016  . Hyperglycemia 01/09/2016  . Hyperlipidemia 05/14/2017  . Hypertension   . MRSA (methicillin resistant Staphylococcus aureus) 2009   right groin area-no issues now. 04-07-14 PCR screen negative today.  . Neuropathy   . Preventative health care 11/13/2016   Past Surgical History:  Past Surgical History:  Procedure Laterality Date  . ANAL SPHINCTEROTOMY  04/2011  . APPENDECTOMY  1980  . AXILLARY LYMPH NODE DISSECTION Left 02/04/2013   Procedure: LEFT AXILLARY LYMPH NODE DISSECTION;  Surgeon: Stark Klein, MD;  Location: [facility];  Service: General;  Laterality: Left;  End: 4196  . BREAST LUMPECTOMY WITH NEEDLE LOCALIZATION Left 02/04/2013   Procedure: LEFT BREAST LUMPECTOMY WITH NEEDLE LOCALIZATION;  Surgeon: Stark Klein, MD;  Location: [facility];  Service: General;  Laterality: Left;  . BREAST SURGERY     Lumpectomy in april 2014  . HEMORRHOID SURGERY  04/2011   ligation  . MASTECTOMY Left 02/15/2017  . PORT-A-CATH REMOVAL N/A 04/16/2014   Procedure: REMOVAL PORT-A-CATH;  Surgeon: Stark Klein, MD;  Location: [facility];  Service: General;  Laterality: N/A;  . PORTACATH PLACEMENT Right 02/04/2013   Procedure: INSERTION PORT-A-CATH;  Surgeon: Stark Klein, MD;  Location: [facility];  Service: General;  Laterality: Right;  Start Time: 2229.  Marland Kitchen  SHOULDER ARTHROSCOPY WITH ROTATOR CUFF REPAIR AND SUBACROMIAL DECOMPRESSION Left 02/24/2014   Procedure: SHOULDER ARTHROSCOPY WITH ROTATOR CUFF REPAIR AND SUBACROMIAL DECOMPRESSION;  Surgeon: Meredith Pel, MD;  Location: [facility];  Service: Orthopedics;  Laterality: Left;  LEFT SHOULDER DIAGNOSTIC OPERATIVE ARTHROSCOPY, SUBACROMIAL DECOMPRESSION, ROTATOR CUFF TEAR REPAIR  . SIMPLE MASTECTOMY WITH AXILLARY SENTINEL NODE BIOPSY Left 02/15/2017   Procedure: LEFT MASTECTOMY;  Surgeon: Stark Klein, MD;  Location: [facility];  Service: General;  Laterality: Left;  . TOTAL MASTECTOMY Right 12/26/2018   Procedure: RIGHT BREAST PROPHYLATIC MASTECTOMY;  Surgeon: Stark Klein, MD;  Location: [facility];  Service: General;  Laterality: Right;   Social History:  reports that she has never smoked. She has never used smokeless tobacco. She reports that she does not drink alcohol and does not use drugs.  Family / Support Systems Marital Status: Married Patient Roles: Spouse Spouse/Significant Other: Stephen Children: Daughter Other Supports: sister in Sports coach Anticipated Caregiver: Husband, Dtr and sister in law Ability/Limitations of Caregiver: Spouse works, dtr and sister in Sports coach can assist Caregiver Availability: 24/7  Social History Preferred language: English Religion: Baptist Read: Yes Write: Yes Employment Status: Retired   Abuse/Neglect Abuse/Neglect Assessment Can Be Completed: Yes Physical Abuse: Denies Verbal Abuse: Denies Sexual Abuse: Denies Exploitation of patient/patient's resources: Denies Self-Neglect: Denies  Emotional Status Pt's affect, behavior and adjustment status: no Recent Psychosocial Issues: no Psychiatric History: no Substance Abuse History: no  Patient / Family Perceptions, Expectations & Goals Pt/Family understanding of illness &  functional limitations: yes Pt/family expectations/goals: Goal to discharge home  [employer]: None Premorbid Home  Care/DME Agencies: None Transportation available at discharge: Daughter able to transport  Discharge Planning Living Arrangements: Spouse/significant other, Children Support Systems: Spouse/significant other, Children Type of Residence: Private residence Insurance Resources: Multimedia programmer (specify) [facility] Medicare) Does the patient have any problems obtaining your medications?: No Care Coordinator Anticipated Follow Up Needs: HH/OP Expected length of stay: Patient discharging 06/22/2020  Clinical Impression Sw entered room, introduced self and explain role. Daughter at bedside. Discussed discharge information. No additional questions or concerns. SW will continue to follow up  Dyanne Iha 06/21/2020, 2:36 PM

## 2020-06-21 NOTE — Progress Notes (Signed)
Patient information reviewed and entered into eRehab System by Becky Salih Williamson, PPS coordinator. Information including medical coding, function ability, and quality indicators will be reviewed and updated through discharge.   

## 2020-06-21 NOTE — Progress Notes (Signed)
Occupational Therapy Session Note  Patient Details  Name: Kristi [name] MRN: [id number] Date of Birth: 08/10/1956  Today's Date: 06/21/2020 OT Individual Time: 1130-1200 OT Individual Time Calculation (min): 30 min    Short Term Goals: Week 1:  OT Short Term Goal 1 (Week 1): STGs = LTGs d/t ELOS  Skilled Therapeutic Interventions/Progress Updates:    Pt received in recliner agreeable to therapy.  She stated she was not having any dizziness during the session or light headed ness.  Pt asked if anything feels "different" to her from before her stroke and she was not able to identify.  OT evaluation addressed endurance and balance so had pt work on these skills with standing exercises: -overhead reaching as if pulling on a rope- -reaching to alternating knees -torso twists with arms out to side  -torso twists with holding pillow -partial squat with reaching pillow towards floor and then reaching over head 20x demonstrating good endurance  Pt had no LOB, c/o fatigue.  Her daughter arrived towards end of session. Daughter is here for family education this afternoon but did discuss some recommendations for pt returning to IADLS.  For ex, having her fold towels and wash dishes but to have close S with making the bed or cleaning the bathrooms.    Showed the exercises to the daughter and recommended they do fun "dancing" like exercises at home to music with arm ROM to keep up her endurance.   Pt tolerated therapy well. Resting in recliner with alarm on and all needs met.  Therapy Documentation Precautions:  Precautions Precautions: Fall Restrictions Weight Bearing Restrictions: No   Pain: No c/o pain     Therapy/Group: Individual Therapy  , 06/21/2020, 12:11 PM  

## 2020-06-21 NOTE — Discharge Instructions (Addendum)
STROKE/TIA DISCHARGE INSTRUCTIONS SMOKING Cigarette smoking nearly doubles your risk of having a stroke & is the single most alterable risk factor  If you smoke or have smoked in the last 12 months, you are advised to quit smoking for your health.  Most of the excess cardiovascular risk related to smoking disappears within a year of stopping.  Ask you doctor about anti-smoking medications  [facility] Quit Line: 1-800-QUIT NOW  Free Smoking Cessation Classes [phone]  CHOLESTEROL Know your levels; limit fat & cholesterol in your diet  Lipid Panel     Component Value Date/Time   CHOL 144 06/16/2020 0801   TRIG 68 06/16/2020 0801   HDL 41 06/16/2020 0801   CHOLHDL 3.5 06/16/2020 0801   VLDL 14 06/16/2020 0801   LDLCALC 89 06/16/2020 0801      Many patients benefit from treatment even if their cholesterol is at goal.  Goal: Total Cholesterol (CHOL) less than 160  Goal:  Triglycerides (TRIG) less than 150  Goal:  HDL greater than 40  Goal:  LDL (LDLCALC) less than 100   BLOOD PRESSURE American Stroke Association blood pressure target is less that 120/80 mm/Hg  Your discharge blood pressure is:  BP: 116/67  Monitor your blood pressure  Limit your salt and alcohol intake  Many individuals will require more than one medication for high blood pressure  DIABETES (A1c is a blood sugar average for last 3 months) Goal HGBA1c is under 7% (HBGA1c is blood sugar average for last 3 months)  Diabetes: No known diagnosis of diabetes    Lab Results  Component Value Date   HGBA1C 5.9 (H) 06/16/2020     Your HGBA1c can be lowered with medications, healthy diet, and exercise.  Check your blood sugar as directed by your physician  Call your physician if you experience unexplained or low blood sugars.  PHYSICAL ACTIVITY/REHABILITATION Goal is 30 minutes at least 4 days per week  Activity: Increase activity slowly, Therapies: Physical Therapy: Home Health Return to work:   Activity  decreases your risk of heart attack and stroke and makes your heart stronger.  It helps control your weight and blood pressure; helps you relax and can improve your mood.  Participate in a regular exercise program.  Talk with your doctor about the best form of exercise for you (dancing, walking, swimming, cycling).  DIET/WEIGHT Goal is to maintain a healthy weight  Your discharge diet is:  Diet Order            Diet Heart Room service appropriate? Yes; Fluid consistency: Thin  Diet effective now                 liquids Your height is:  Height: 5\' 2"  (157.5 cm) Your current weight is: Weight: 67.4 kg Your Body Mass Index (BMI) is:  BMI (Calculated): 27.17  Following the type of diet specifically designed for you will help prevent another stroke.  Your goal weight range is:    Your goal Body Mass Index (BMI) is 19-24.  Healthy food habits can help reduce 3 risk factors for stroke:  High cholesterol, hypertension, and excess weight.  RESOURCES Stroke/Support Group:  Call [phone]   STROKE EDUCATION PROVIDED/REVIEWED AND GIVEN TO PATIENT Stroke warning signs and symptoms How to activate emergency medical system (call 911). Medications prescribed at discharge. Need for follow-up after discharge. Personal risk factors for stroke. Pneumonia vaccine given:  Flu vaccine given:  My questions have been answered, the writing is legible, and I  understand these instructions.  I will adhere to these goals & educational materials that have been provided to me after my discharge from the hospital.   Inpatient Rehab Discharge Instructions  Kristi [name] Discharge date and time: No discharge date for patient encounter.   Activities/Precautions/ Functional Status: Activity: As tolerated Diet: Regular Wound Care: Routine skin checks Functional status:  ___ No restrictions     ___ Walk up steps independently ___ 24/7 supervision/assistance   ___ Walk up steps with assistance ___  Intermittent supervision/assistance  ___ Bathe/dress independently ___ Walk with walker     _x__ Bathe/dress with assistance ___ Walk Independently    ___ Shower independently ___ Walk with assistance    ___ Shower with assistance ___ No alcohol     ___ Return to work/school ________ COMMUNITY REFERRALS UPON DISCHARGE:    Outpatient: PT     OT                 Agency: [facility] Phone: [phone]              Appointment Date/Time: Facility to Determine  Special Instructions: No driving smoking or alcohol  Continue aspirin 325 mg daily and Plavix 75 mg daily x3 months then aspirin alone   My questions have been answered and I understand these instructions. I will adhere to these goals and the provided educational materials after my discharge from the hospital.  Patient/Caregiver Signature _______________________________ Date __________  Clinician Signature _______________________________________ Date __________  Please bring this form and your medication list with you to all your follow-up doctor's appointments.   =====================================================  Continue aspirin and clopidogrel for treatment of stroke until 10/31; then on 11/1 stop taking clopidogrel and continue aspirin.   ================================================

## 2020-06-21 NOTE — Progress Notes (Signed)
Patient ID: Kristi [name], female   DOB: 02-Dec-1955, 64 y.o.   MRN: [id number] Met with the patient to review role of CM and collaboration with SW to facilitate discharge preparations. Reviewed risk factors for secondary stroke including HTN, HLD and permissive HTN with DAPT x 3 months per Dr. Erlinda Hong. Reviewed DASH diet with low fat and cholesterol diet recommendations and cooking with less salt suggestions. Feels like she has covered most concerns with therapy for discharge and feels ready for discharge 06/22/20.

## 2020-06-21 NOTE — Plan of Care (Signed)
  Problem: RH SAFETY Goal: RH STG ADHERE TO SAFETY PRECAUTIONS W/ASSISTANCE/DEVICE Description: STG Adhere to Safety Precautions With cues/reminders Assistance/Device. Outcome: Progressing Goal: RH STG DECREASED RISK OF FALL WITH ASSISTANCE Description: STG Decreased Risk of Fall With min Assistance. Outcome: Progressing   Problem: RH PAIN MANAGEMENT Goal: RH STG PAIN MANAGED AT OR BELOW PT'S PAIN GOAL Description: Less than 3 Outcome: Progressing   Problem: RH KNOWLEDGE DEFICIT Goal: RH STG INCREASE KNOWLEDGE OF HYPERTENSION Description: Patient will be able to manage HTN; using diet, medications and knowledge of secondary stroke prevention with educational resources independently Outcome: Progressing Goal: RH STG INCREASE KNOWLEGDE OF HYPERLIPIDEMIA Description: Patient will be able to manage HLD; using diet, medications and knowledge of secondary stroke prevention with educational resources independently Outcome: Progressing Goal: RH STG INCREASE KNOWLEDGE OF STROKE PROPHYLAXIS Description: Patient will be able to demonstrate knowledge of secondary stroke prevention with educational resources independently Outcome: Progressing

## 2020-06-21 NOTE — Progress Notes (Signed)
Occupational Therapy Session Note  Patient Details  Name: Kristi [name] MRN: [id number] Date of Birth: [birth year]  Today's Date: 06/21/2020 OT Individual Time: 1531-1630 OT Individual Time Calculation (min): 59 min    Short Term Goals: Week 1:  OT Short Term Goal 1 (Week 1): STGs = LTGs d/t ELOS  Skilled Therapeutic Interventions/Progress Updates:    Pt sitting in recliner with family member present for family education.  Reviewed shower transfer simulating small threshold step over with mod I.  Pt completed toilet transfer with mod I.  Pt ambulated to ADL suite kitchen without AD with mod I.  Educated pt and family member on safety strategies, environmental modifications, and energy conservation techniques in kitchen.  Pt washed various dishes in standing with mod I.  Pt swept kitchen floor with CGA to bend over and sweep into dust pan.  Educated pt and family member on benefits of long handled dust pan to increase safety and independence.  Pt retrieved various pots and pans from bottom cabinet and filled large pot up with water and transferred to stovetop.  Educated pt on safety strategies to drain hot pot of water including pot holder slide and using small drainer or drain spoon and waiting for water to cool before emptying.  Pt able to describe good safety awareness regarding cooking on stovetop. Discussed compensatory strategies when completing laundry.  Pt reports good understanding.  Pt ambulated back to room with mod I without AD.  OT provided pt educational handout on energy conservation. Per pt and family member, pt will have support at home as needed from husband for household chores.  Call bell in reach.  Therapy Documentation Precautions:  Precautions Precautions: Fall Precaution Comments: central vestibular impairment Restrictions Weight Bearing Restrictions: No   Therapy/Group: Individual Therapy  Ezekiel Slocumb 06/21/2020, 5:10 PM

## 2020-06-21 NOTE — Discharge Summary (Signed)
Physician Discharge Summary  Patient ID: Kristi [name] MRN: [id number] DOB/AGE: [birth year] 64 y.o.  Admit date: 06/19/2020 Discharge date: 06/22/2020  Discharge Diagnoses:  Principal Problem:   Cerebral infarction involving left cerebellar artery [facility]) DVT prophylaxis Pain management Hypertension Hyperlipidemia Left breast cancer Depression/anxiety  Discharged Condition: Stable  Significant Diagnostic Studies: CT ANGIO HEAD W OR WO CONTRAST  Result Date: 06/16/2020 CLINICAL DATA:  Cerebellar infarct.  Headache and dizziness. EXAM: CT ANGIOGRAPHY HEAD AND NECK TECHNIQUE: Multidetector CT imaging of the head and neck was performed using the standard protocol during bolus administration of intravenous contrast. Multiplanar CT image reconstructions and MIPs were obtained to evaluate the vascular anatomy. Carotid stenosis measurements (when applicable) are obtained utilizing NASCET criteria, using the distal internal carotid diameter as the denominator. CONTRAST:  12mL OMNIPAQUE IOHEXOL 350 MG/ML SOLN COMPARISON:  CT head without contrast 06/15/2020. MR head without contrast 06/16/2020. FINDINGS: CT HEAD FINDINGS Brain: Acute/subacute nonhemorrhagic infarct is again noted in the inferomedial left cerebellum and posteromedial right cerebellum. No new infarcts are present. No acute hemorrhage is present. Supratentorial structures are within normal limits. Ventricles are normal. No significant extraaxial fluid collection is present. Vascular: Atherosclerotic changes are present within the cavernous internal carotid arteries bilaterally without a hyperdense vessel. Skull: Calvarium is intact. No focal lytic or blastic lesions are present. No significant extracranial soft tissue lesion is present. Sinuses: The paranasal sinuses and mastoid air cells are clear. Orbits: The globes and orbits are within normal limits. Review of the MIP images confirms the above findings CTA NECK FINDINGS Aortic arch: A 3  vessel arch configuration is present. Minimal atherosclerotic changes present without stenosis or aneurysm. Right carotid system: The right common carotid artery is within normal limits. Minimal irregularity is present at the bifurcation without significant stenosis. Subtle narrowing is present in the mid cervical right ICA without significant stenosis. Minimal vessel wall irregularity is present. Left carotid system: The left common carotid artery is within normal limits. The bifurcation is unremarkable. The proximal left ICA is normal. High-grade stenosis is present in the proximal left external carotid artery. There is some vessel wall irregularity in the mid cervical left ICA without significant stenosis. Vertebral arteries: The left vertebral artery is the dominant vessel. Scratched at the right vertebral artery is the dominant vessel, originating from the subclavian artery. High-grade stenosis present at the origin of the left vertebral artery with occlusion less than 1 cm from the origin. The vessel is reconstituted at the level of C6. A hypoplastic left vertebral artery demonstrates no other significant stenoses or occlusion. No significant stenosis is present in the cervical right vertebral artery. Skeleton: Degenerative changes of the cervical spine are most evident at C4-5, C5-6, and C6-7 with uncovertebral spurring and reversal of the normal cervical lordosis. No focal lytic or blastic lesions are present. Vertebral body heights are maintained. Other neck: The soft tissues of the neck are otherwise within normal limits. Upper chest: Lung apices are clear. Thoracic inlet is within normal limits. Review of the MIP images confirms the above findings CTA HEAD FINDINGS Anterior circulation: Minimal atherosclerotic changes are present within the cavernous internal carotid arteries bilaterally without stenosis. Internal carotid arteries are otherwise within normal limits through the ICA termini. The A1 and M1  segments are normal. The anterior communicating artery is patent. MCA bifurcations are normal. ACA and MCA branch vessels are within normal limits. Posterior circulation: The right vertebral artery is the dominant vessel. PICA origins are visualized and normal. Hypoplastic V4 segment  extends to the vertebrobasilar junction on the left. The basilar artery is normal. Both posterior cerebral arteries originate from the basilar tip. The PCA branch vessels are within normal limits. Venous sinuses: The dural sinuses are patent. The straight sinus and deep cerebral veins are patent. The right transverse sinus is dominant. Cortical veins are unremarkable. Anatomic variants: None Review of the MIP images confirms the above findings IMPRESSION: 1. High-grade stenosis at the origin of the left vertebral artery with occlusion less than 1 cm from the origin. The vessel is reconstituted at the level of C6. 2. The right vertebral artery is dominant. 3. High-grade stenosis at the origin of the left external carotid artery. 4. Minimal atherosclerotic changes at the right carotid bifurcation and cavernous internal carotid arteries bilaterally without significant stenosis. 5. Minimal vessel wall irregularity in the mid cervical internal carotid arteries bilaterally without significant stenosis. This may represent fibromuscular dysplasia. 6. Multilevel spondylosis of the cervical spine. Electronically Signed   By: San Morelle M.D.   On: 06/16/2020 06:58   CT HEAD WO CONTRAST  Result Date: 06/18/2020 CLINICAL DATA:  Follow-up left cerebellar infarct EXAM: CT HEAD WITHOUT CONTRAST TECHNIQUE: Contiguous axial images were obtained from the base of the skull through the vertex without intravenous contrast. COMPARISON:  06/16/2020, 06/15/2020 FINDINGS: Brain: Geographic area of decreased attenuation is noted in the medial aspect of the left cerebellar hemisphere with some localized mass effect on the fourth ventricle. No  hemorrhagic component is noted. The area of abnormality in the medial aspect of the right cerebellar hemisphere on recent MRI is again identified as well. No new focal hemorrhage or infarct is identified. Vascular: No hyperdense vessel or unexpected calcification. Skull: Normal. Negative for fracture or focal lesion. Sinuses/Orbits: No acute finding. Other: None. IMPRESSION: Evolving infarcts in the medial aspect of the cerebellar hemispheres bilaterally left greater than right similar to that seen on recent MRI. Electronically Signed   By: Inez Catalina M.D.   On: 06/18/2020 08:44   CT Head Wo Contrast  Result Date: 06/15/2020 CLINICAL DATA:  Dizziness EXAM: CT HEAD WITHOUT CONTRAST TECHNIQUE: Contiguous axial images were obtained from the base of the skull through the vertex without intravenous contrast. COMPARISON:  MRI 10/08/2019 FINDINGS: Brain: There is area of low-density noted in the inferior left cerebellar hemisphere compatible with acute infarct. Subtle low-density noted in the left temporal, parietal and occipital lobes which I favor is artifactual, but given the acute infarct in the left cerebellum, cannot completely exclude infarct. No hemorrhage or hydrocephalus. Vascular: No hyperdense vessel or unexpected calcification. Skull: No acute calvarial abnormality. Sinuses/Orbits: Visualized paranasal sinuses and mastoids clear. Orbital soft tissues unremarkable. Other: None IMPRESSION: Area of low-density in the inferior left cerebellar hemisphere compatible with acute infarction. Subtle decreased attenuation throughout much of the left temporal, parietal and occipital lobes which I favor is artifactual. No hemorrhage. Electronically Signed   By: Rolm Baptise M.D.   On: 06/15/2020 18:35   CT ANGIO NECK W OR WO CONTRAST  Result Date: 06/16/2020 CLINICAL DATA:  Cerebellar infarct.  Headache and dizziness. EXAM: CT ANGIOGRAPHY HEAD AND NECK TECHNIQUE: Multidetector CT imaging of the head and neck was  performed using the standard protocol during bolus administration of intravenous contrast. Multiplanar CT image reconstructions and MIPs were obtained to evaluate the vascular anatomy. Carotid stenosis measurements (when applicable) are obtained utilizing NASCET criteria, using the distal internal carotid diameter as the denominator. CONTRAST:  36mL OMNIPAQUE IOHEXOL 350 MG/ML SOLN COMPARISON:  CT head  without contrast 06/15/2020. MR head without contrast 06/16/2020. FINDINGS: CT HEAD FINDINGS Brain: Acute/subacute nonhemorrhagic infarct is again noted in the inferomedial left cerebellum and posteromedial right cerebellum. No new infarcts are present. No acute hemorrhage is present. Supratentorial structures are within normal limits. Ventricles are normal. No significant extraaxial fluid collection is present. Vascular: Atherosclerotic changes are present within the cavernous internal carotid arteries bilaterally without a hyperdense vessel. Skull: Calvarium is intact. No focal lytic or blastic lesions are present. No significant extracranial soft tissue lesion is present. Sinuses: The paranasal sinuses and mastoid air cells are clear. Orbits: The globes and orbits are within normal limits. Review of the MIP images confirms the above findings CTA NECK FINDINGS Aortic arch: A 3 vessel arch configuration is present. Minimal atherosclerotic changes present without stenosis or aneurysm. Right carotid system: The right common carotid artery is within normal limits. Minimal irregularity is present at the bifurcation without significant stenosis. Subtle narrowing is present in the mid cervical right ICA without significant stenosis. Minimal vessel wall irregularity is present. Left carotid system: The left common carotid artery is within normal limits. The bifurcation is unremarkable. The proximal left ICA is normal. High-grade stenosis is present in the proximal left external carotid artery. There is some vessel wall  irregularity in the mid cervical left ICA without significant stenosis. Vertebral arteries: The left vertebral artery is the dominant vessel. Scratched at the right vertebral artery is the dominant vessel, originating from the subclavian artery. High-grade stenosis present at the origin of the left vertebral artery with occlusion less than 1 cm from the origin. The vessel is reconstituted at the level of C6. A hypoplastic left vertebral artery demonstrates no other significant stenoses or occlusion. No significant stenosis is present in the cervical right vertebral artery. Skeleton: Degenerative changes of the cervical spine are most evident at C4-5, C5-6, and C6-7 with uncovertebral spurring and reversal of the normal cervical lordosis. No focal lytic or blastic lesions are present. Vertebral body heights are maintained. Other neck: The soft tissues of the neck are otherwise within normal limits. Upper chest: Lung apices are clear. Thoracic inlet is within normal limits. Review of the MIP images confirms the above findings CTA HEAD FINDINGS Anterior circulation: Minimal atherosclerotic changes are present within the cavernous internal carotid arteries bilaterally without stenosis. Internal carotid arteries are otherwise within normal limits through the ICA termini. The A1 and M1 segments are normal. The anterior communicating artery is patent. MCA bifurcations are normal. ACA and MCA branch vessels are within normal limits. Posterior circulation: The right vertebral artery is the dominant vessel. PICA origins are visualized and normal. Hypoplastic V4 segment extends to the vertebrobasilar junction on the left. The basilar artery is normal. Both posterior cerebral arteries originate from the basilar tip. The PCA branch vessels are within normal limits. Venous sinuses: The dural sinuses are patent. The straight sinus and deep cerebral veins are patent. The right transverse sinus is dominant. Cortical veins are  unremarkable. Anatomic variants: None Review of the MIP images confirms the above findings IMPRESSION: 1. High-grade stenosis at the origin of the left vertebral artery with occlusion less than 1 cm from the origin. The vessel is reconstituted at the level of C6. 2. The right vertebral artery is dominant. 3. High-grade stenosis at the origin of the left external carotid artery. 4. Minimal atherosclerotic changes at the right carotid bifurcation and cavernous internal carotid arteries bilaterally without significant stenosis. 5. Minimal vessel wall irregularity in the mid cervical internal carotid  arteries bilaterally without significant stenosis. This may represent fibromuscular dysplasia. 6. Multilevel spondylosis of the cervical spine. Electronically Signed   By: San Morelle M.D.   On: 06/16/2020 06:58   MR BRAIN WO CONTRAST  Result Date: 06/16/2020 CLINICAL DATA:  Episode of dizziness and mild-to-moderate headache 2 days ago. Nausea. EXAM: MRI HEAD WITHOUT CONTRAST TECHNIQUE: Multiplanar, multiecho pulse sequences of the brain and surrounding structures were obtained without intravenous contrast. COMPARISON:  MR head without contrast scratched at CT head without contrast 06/15/2020. MR head without contrast 10/08/2019 FINDINGS: Brain: Acute/subacute nonhemorrhagic infarct is confirmed in the inferomedial left cerebellum. Additional posterior cortical infarct is present in the medial right cerebellum. Infarct involves the inferior vermis scattered more superior posterior infarcts. No acute or subacute supratentorial infarct is present. T2 signal changes are associated with the areas of acute/subacute infarct. No significant supratentorial white matter disease is present. The ventricles are of normal size. No significant extraaxial fluid collection is present. Vascular: Prominent vein extends into the superior vermis, likely DVA. Flow is present in the major intracranial arteries. No other venous  abnormalities are present. Skull and upper cervical spine: Mild multilevel degenerative changes are present in the upper cervical spine, most prominent at C3-4. Marrow signal is normal. Vertebral body heights are normal. Craniocervical junction is normal. Midline structures are unremarkable. Sinuses/Orbits: The paranasal sinuses and mastoid air cells are clear. The globes and orbits are within normal limits. IMPRESSION: 1. Acute/subacute nonhemorrhagic infarct involving the inferomedial left cerebellum. 2. Additional acute/subacute nonhemorrhagic posterior cortical infarct in the medial right cerebellum. 3. No acute or subacute supratentorial infarct. Electronically Signed   By: San Morelle M.D.   On: 06/16/2020 06:44   ECHOCARDIOGRAM COMPLETE  Result Date: 06/16/2020    ECHOCARDIOGRAM REPORT   Patient Name:   Kristi [name] Date of Exam: 06/16/2020 Medical Rec #:  [id number]      Height:       62.0 in Accession #:    [id number]     Weight:       159.2 lb Date of Birth:  1955-12-27      BSA:          1.735 m Patient Age:    58 years       BP:           111/57 mmHg Patient Gender: F              HR:           74 bpm. Exam Location:  Inpatient Procedure: 2D Echo, Color Doppler and Cardiac Doppler Indications:    Stroke 434.91  History:        Patient has prior history of Echocardiogram examinations, most                 recent 07/21/2013. Risk Factors:Hypertension, Dyslipidemia and                 Non-Smoker.  Sonographer:    Vickie Epley RDCS Referring Phys: [place]  Sonographer Comments: Image acquisition challenging due to mastectomy. IMPRESSIONS  1. Left ventricular ejection fraction, by estimation, is 60 to 65%. The left ventricle has normal function. The left ventricle has no regional wall motion abnormalities. Left ventricular diastolic parameters are consistent with Grade I diastolic dysfunction (impaired relaxation).  2. Right ventricular systolic function is normal. The right  ventricular size is normal. Tricuspid regurgitation signal is inadequate for assessing PA pressure.  3. The mitral valve is normal in  structure. No evidence of mitral valve regurgitation. No evidence of mitral stenosis.  4. The aortic valve is tricuspid. Aortic valve regurgitation is not visualized. No aortic stenosis is present.  5. The inferior vena cava is normal in size with greater than 50% respiratory variability, suggesting right atrial pressure of 3 mmHg. FINDINGS  Left Ventricle: Left ventricular ejection fraction, by estimation, is 60 to 65%. The left ventricle has normal function. The left ventricle has no regional wall motion abnormalities. The left ventricular internal cavity size was normal in size. There is  no left ventricular hypertrophy. Left ventricular diastolic parameters are consistent with Grade I diastolic dysfunction (impaired relaxation). Right Ventricle: The right ventricular size is normal. No increase in right ventricular wall thickness. Right ventricular systolic function is normal. Tricuspid regurgitation signal is inadequate for assessing PA pressure. Left Atrium: Left atrial size was normal in size. Right Atrium: Right atrial size was normal in size. Pericardium: There is no evidence of pericardial effusion. Mitral Valve: The mitral valve is normal in structure. No evidence of mitral valve regurgitation. No evidence of mitral valve stenosis. Tricuspid Valve: The tricuspid valve is normal in structure. Tricuspid valve regurgitation is trivial. Aortic Valve: The aortic valve is tricuspid. Aortic valve regurgitation is not visualized. No aortic stenosis is present. Pulmonic Valve: The pulmonic valve was normal in structure. Pulmonic valve regurgitation is not visualized. Aorta: The aortic root is normal in size and structure. Venous: The inferior vena cava is normal in size with greater than 50% respiratory variability, suggesting right atrial pressure of 3 mmHg. IAS/Shunts: No atrial  level shunt detected by color flow Doppler.  LEFT VENTRICLE PLAX 2D LVIDd:         4.80 cm     Diastology LVIDs:         3.70 cm     LV e' lateral:   11.70 cm/s LV PW:         0.50 cm     LV E/e' lateral: 6.7 LV IVS:        0.50 cm     LV e' medial:    7.94 cm/s LVOT diam:     1.80 cm     LV E/e' medial:  9.8 LV SV:         59 LV SV Index:   34 LVOT Area:     2.54 cm  LV Volumes (MOD) LV vol d, MOD A2C: 77.8 ml LV vol d, MOD A4C: 75.8 ml LV vol s, MOD A2C: 35.4 ml LV vol s, MOD A4C: 31.5 ml LV SV MOD A2C:     42.4 ml LV SV MOD A4C:     75.8 ml LV SV MOD BP:      41.4 ml RIGHT VENTRICLE RV S prime:     13.30 cm/s TAPSE (M-mode): 2.1 cm LEFT ATRIUM             Index       RIGHT ATRIUM          Index LA diam:        3.60 cm 2.08 cm/m  RA Area:     8.53 cm LA Vol (A2C):   29.1 ml 16.77 ml/m RA Volume:   15.60 ml 8.99 ml/m LA Vol (A4C):   24.2 ml 13.95 ml/m LA Biplane Vol: 29.4 ml 16.95 ml/m  AORTIC VALVE LVOT Vmax:   117.00 cm/s LVOT Vmean:  77.700 cm/s LVOT VTI:    0.231 m  AORTA Ao Root diam: 2.70  cm MITRAL VALVE MV Area (PHT): 4.39 cm    SHUNTS MV Decel Time: 173 msec    Systemic VTI:  0.23 m MV E velocity: 78.00 cm/s  Systemic Diam: 1.80 cm MV A velocity: 73.70 cm/s MV E/A ratio:  1.06 Loralie Champagne MD Electronically signed by Loralie Champagne MD Signature Date/Time: 06/16/2020/7:00:28 PM    Final     Labs:  Basic Metabolic Panel: Recent Labs  Lab 06/15/20 1929 06/16/20 0801 06/17/20 0154 06/21/20 0522  NA 137  --  139 141  K 4.1  --  3.8 4.2  CL 99  --  104 104  CO2 29  --  27 26  GLUCOSE 105*  --  112* 97  BUN 14  --  10 9  CREATININE 1.09* 1.19* 1.09* 0.93  CALCIUM 10.4*  --  9.4 9.9    CBC: Recent Labs  Lab 06/15/20 1929 06/16/20 0801 06/21/20 0522  WBC 6.5 6.1 6.1  NEUTROABS 3.1  --  2.6  HGB 11.5* 10.6* 10.3*  HCT 35.7* 33.0* 31.3*  MCV 85.6 85.5 85.1  PLT 183 161 163    CBG: Recent Labs  Lab 06/15/20 1700  GLUCAP 122*   Family history.  Father with lung cancer  hypertension as well as thyroid cancer.  Cousin with breast cancer mother with diabetes mellitus.  Denies any esophageal cancer colon cancer or rectal cancer  Brief HPI:   Kristi [name] is a 64 y.o. right-handed female with history of left breast cancer prior radiation therapy and chemotherapy, hypertension hyperlipidemia.  Lives with spouse independent prior to admission.  Two-level home.  Presented 06/15/2020 with dizziness and gait abnormality with vertigo nausea as well as mild to moderate headache.  Cranial CT scan showed area of low density in the inferior cerebellar hemisphere compatible with acute infarction.  No hemorrhage.  MRI identified acute subacute nonhemorrhagic infarction involving the inferior medial left cerebellum additional acute subacute nonhemorrhagic posterior cortical infarct in the medial right cerebellum.  Patient did not receive TPA.  CT angiogram of head and neck high-grade stenosis of the origin of the left vertebral artery with occlusion less than 1 cm from the origin.  The vessel was well reconstituted at the level of C6.  High-grade stenosis of the origin of the left external carotid artery.  Echocardiogram with ejection fraction of 78% grade 1 diastolic dysfunction.  Admission chemistries alcohol negative creatinine 1.09 SARS coronavirus negative.  Maintained on aspirin and Plavix x3 months and aspirin alone.  Subcutaneous Lovenox for DVT prophylaxis.  Patient was admitted for a comprehensive rehab program   Hospital Course: Kristi [name] was admitted to rehab 06/19/2020 for inpatient therapies to consist of PT, ST and OT at least three hours five days a week. Past admission physiatrist, therapy team and rehab RN have worked together to provide customized collaborative inpatient rehab.  Pertaining to patient's left PICA and small right PICA infarction with left vertebral artery occlusion remained stable she would continue on aspirin and Plavix x3 months and aspirin alone.   Initially on subcutaneous Lovenox for DVT prophylaxis discontinued as patient was ambulatory.  Pain managed with use of Neurontin as advised hydrocodone as needed.  Mood stabilization with Latuda as well as trazodone.  Blood pressure control with valsartan/HCTZ and she would follow-up with her primary MD.  Lipitor ongoing for hyperlipidemia.  Left breast cancer she would remain on arimidex.   Blood pressures were monitored on TID basis and controlled     Rehab course:  During patient's stay in rehab weekly team conferences were held to monitor patient's progress, set goals and discuss barriers to discharge. At admission, patient required.  Moderate assist 175 feet 1 person hand-held assist minimal assist sit to stand.  Minimal guard upper body bathing minimal assist lower body bathing minimal guard upper body dressing moderate assist lower body dressing  Physical exam.  Blood pressure 109/68 pulse 68 temperature 96 respirations 18 oxygen saturation 90% room air General.  Alert and oriented no apparent distress HEENT Head.  Normocephalic and atraumatic Eyes.  Pupils round and reactive to light no discharge.nystagmus Neck.  Supple nontender no JVD without thyromegaly Cardiac regular rate rhythm without any extra sounds or murmur heard Abdomen.  Soft nontender positive bowel sounds without rebound Respiratory effort normal no respiratory distress without wheeze Extremities no clubbing cyanosis or edema Neuro.  Alert oriented follows commands fair awareness of deficits strength grossly graded 5/5 throughout no sensory deficits.  Kristi [name]  has had improvement in activity tolerance, balance, postural control as well as ability to compensate for deficits. Kristi [name] has had improvement in functional use RUE/LUE  and RLE/LLE as well as improvement in awareness.  Patient progressing nicely.  She is ambulating 175 feet contact-guard assist and close monitoring of ataxia.  She can gather her belongings for  activities day living and homemaking.  Discussed all risks of falls.  Full family teaching completed plan discharged home       Disposition: Discharged home    Diet: Regular  Special Instructions: No driving smoking or alcohol  Continue aspirin Plavix x3 months then aspirin alone  Medications at discharge. 1.  Tylenol as needed 2.  Aspirin 325 mg p.o. daily 3.  Plavix 25 mg p.o. daily 4.  Neurontin 600/600/900mg   5.  Hydrocodone 1 tablet every 6 hours as needed pain 6.  Valsartan-HCTZ 1 tablet daily 7.  Latuda 40 mg p.o. nightly 8.  Trazodone 50 mg p.o. nightly 9.  Magnesium 250 mg daily 10.Arimidex 1 mg daily 11.atarax 50 mg daily  30-35 minutes were spent completing discharge summary and discharge planning  Discharge Instructions    Ambulatory referral to Neurology   Complete by: As directed    An appointment is requested in approximately 4 weeks left PICA infarction   Ambulatory referral to Physical Medicine Rehab   Complete by: As directed    Moderate complexity follow-up 1 to 2 weeks left PICA/right PICA infarction       Follow-up Information    Kirsteins, Luanna Salk, MD Follow up.   Specialty: Physical Medicine and Rehabilitation Why: Office to call for appointment Contact information: [name] [address] [phone]               Signed: Cathlyn Parsons 06/22/2020, 4:47 AM

## 2020-06-22 NOTE — Progress Notes (Signed)
Patient discharged home with family, all belongings sent with patient. Escorted out by NT

## 2020-06-22 NOTE — Plan of Care (Signed)
  Problem: RH SAFETY Goal: RH STG ADHERE TO SAFETY PRECAUTIONS W/ASSISTANCE/DEVICE Description: STG Adhere to Safety Precautions With cues/reminders Assistance/Device. Outcome: Progressing Goal: RH STG DECREASED RISK OF FALL WITH ASSISTANCE Description: STG Decreased Risk of Fall With min Assistance. Outcome: Progressing   Problem: RH PAIN MANAGEMENT Goal: RH STG PAIN MANAGED AT OR BELOW PT'S PAIN GOAL Description: Less than 3 Outcome: Progressing   Problem: RH KNOWLEDGE DEFICIT Goal: RH STG INCREASE KNOWLEDGE OF HYPERTENSION Description: Patient will be able to manage HTN; using diet, medications and knowledge of secondary stroke prevention with educational resources independently Outcome: Progressing Goal: RH STG INCREASE KNOWLEGDE OF HYPERLIPIDEMIA Description: Patient will be able to manage HLD; using diet, medications and knowledge of secondary stroke prevention with educational resources independently Outcome: Progressing Goal: RH STG INCREASE KNOWLEDGE OF STROKE PROPHYLAXIS Description: Patient will be able to demonstrate knowledge of secondary stroke prevention with educational resources independently Outcome: Progressing

## 2020-06-22 NOTE — Progress Notes (Signed)
Inpatient Rehabilitation Care Coordinator  Discharge Note  The overall goal for the admission was met for:   Discharge location: Yes, Home  Length of Stay: Yes, 3 Days  Discharge activity level: Yes  Home/community participation: Yes  Services provided included: MD, RD, PT, OT, SLP, RN, CM, TR, Pharmacy and [place]: Private Insurance: [facility] Medicare  Follow-up services arranged: Outpatient: [facility]  Comments (or additional information): PT OT  Patient/Family verbalized understanding of follow-up arrangements: Yes  Individual responsible for coordination of the follow-up plan: pt, [phone]  Confirmed correct DME delivered: Dyanne Iha 06/22/2020    Dyanne Iha

## 2020-06-22 NOTE — IPOC Note (Signed)
Overall Plan of Care [facility]) Patient Details Name: Kristi [name] MRN: [id number] DOB: [birth year]  Admitting Diagnosis: Cerebral infarction involving left cerebellar artery [facility])  Hospital Problems: Principal Problem:   Cerebral infarction involving left cerebellar artery ([place])   Functional Problem List: Nursing Safety, Endurance, Medication Management, Motor  PT Balance, Safety  OT Balance, Sensory, Vision  SLP    TR         Basic ADL's: OT Bathing, Dressing, Toileting     Advanced  ADL's: OT       Transfers: PT Furniture, Floor  OT Toilet     Locomotion: PT Ambulation, Stairs     Additional Impairments: OT    SLP        TR      Anticipated Outcomes Item Anticipated Outcome  Self Feeding Independent  Swallowing      Basic self-care  Mod I -  Supervision  Toileting  Mod I   Bathroom Transfers Supervision  Bowel/Bladder  Mod I management of B+B  Transfers  Independent  Locomotion  Independent  Communication     Cognition     Pain  Pain at or below 3  Safety/Judgment  Maintain safety with cues/reminders   Therapy Plan: PT Intensity: Minimum of 1-2 x/day ,45 to 90 minutes PT Frequency: 5 out of 7 days PT Duration Estimated Length of Stay: 3-4 days OT Intensity: Minimum of 1-2 x/day, 45 to 90 minutes OT Frequency: 5 out of 7 days OT Duration/Estimated Length of Stay: 2-3 days     Due to the current state of emergency, patients may not be receiving their 3-hours of Medicare-mandated therapy.   Team Interventions: Nursing Interventions Patient/Family Education, Disease Management/Prevention, Medication Management, Discharge Planning  PT interventions Ambulation/gait training, Community reintegration, DME/adaptive equipment instruction, Neuromuscular re-education, Psychosocial support, Stair training, UE/LE Strength taining/ROM, Training and development officer, Discharge planning, Functional electrical stimulation, Pain management, Skin care/wound  management, Therapeutic Activities, UE/LE Coordination activities, Disease management/prevention, Functional mobility training, Patient/family education, Splinting/orthotics, Therapeutic Exercise, Visual/perceptual remediation/compensation  OT Interventions Balance/vestibular training, Discharge planning, Self Care/advanced ADL retraining, Therapeutic Activities, UE/LE Coordination activities, Visual/perceptual remediation/compensation, Therapeutic Exercise, Patient/family education, Functional mobility training, Disease mangement/prevention, Cognitive remediation/compensation, Community reintegration, DME/adaptive equipment instruction, Neuromuscular re-education, UE/LE Strength taining/ROM, Psychosocial support  SLP Interventions    TR Interventions    SW/CM Interventions Discharge Planning, Psychosocial Support, Patient/Family Education   Barriers to Discharge MD  Medical stability  Nursing Home environment access/layout shower on second level of home.  PT Home environment access/layout, Lack of/limited family support Family will need education on guarding patient on the stairs at home, patient has 2 level home with bed/bath upstairs  OT Other (comments) (none)    SLP      SW       Team Discharge Planning: Destination: PT-Home ,OT- Home , SLP-  Projected Follow-up: PT-Outpatient PT, OT-  Outpatient OT, SLP-  Projected Equipment Needs: PT- , OT- Other (comment), SLP-  Equipment Details: PT-none, OT-shower seat Patient/family involved in discharge planning: PT- Patient,  OT-Patient, SLP-   MD ELOS: 4-5 days Medical Rehab Prognosis:  Excellent Assessment: Kristi [name] is a 64 year old woman who was admitted to CIR with dizziness with gait ataxia secondary to moderate left PICA and small right PICA infarct with left vertebral artery occlusion. Lovenox was discontinued as has started to ambulate well. Gabapentin was uptitrated to help with neuropathic pain. Etiology of stroke and discussion  of strategies to avoid reoccurence was discussed with patient- including use of Holter  monitor in outpatient, nutrition changes, and logging BP to bring into follow-up appointment. Restarted hydroxyzine for anxiety.    See Team Conference Notes for weekly updates to the plan of care

## 2020-06-22 NOTE — Progress Notes (Signed)
[place] PHYSICAL MEDICINE & REHABILITATION PROGRESS NOTE   Subjective/Complaints: Asks about follow-up appointments.  She is ambulating in her room She asks about Holter monitor. Discussed low carb diet.   ROS: -nausea, vomiting  Objective:   No results found. Recent Labs    06/21/20 0522  WBC 6.1  HGB 10.3*  HCT 31.3*  PLT 163   Recent Labs    06/21/20 0522  NA 141  K 4.2  CL 104  CO2 26  GLUCOSE 97  BUN 9  CREATININE 0.93  CALCIUM 9.9    Intake/Output Summary (Last 24 hours) at 06/22/2020 0841 Last data filed at 06/21/2020 2129 Gross per 24 hour  Intake 580 ml  Output --  Net 580 ml     Physical Exam: Vital Signs Blood pressure (!) 99/54, pulse (!) 57, temperature 98.6 F (37 C), resp. rate 16, height 5\' 2"  (1.575 m), weight 67.4 kg, last menstrual period 01/20/2013, SpO2 98 %. Physical Exam General: Alert and oriented x 3, No apparent distress HEENT: Head is normocephalic, atraumatic, PERRLA, EOMI, sclera anicteric, oral mucosa pink and moist, dentition intact, ext ear canals clear,  Neck: Supple without JVD or lymphadenopathy Heart: Reg rate and rhythm. No murmurs rubs or gallops Chest: CTA bilaterally without wheezes, rales, or rhonchi; no distress Abdomen: Soft, non-tender, non-distended, bowel sounds positive. Extremities: No clubbing, cyanosis, or edema. Pulses are 2+ Skin: Clean and intact without signs of breakdown Neuro: Patient is alert in no acute distress.  Follows commands.  Oriented x3.  Fair awareness of deficits. 5/5 strength throughout. No sensory deficit. FTN mildly slowed bilaterally. Sensation intact Psych: Pt's affect is appropriate. Pt is cooperative  Assessment/Plan: 1. Functional deficits secondary to left cerebellar artery stroke which require 3+ hours per day of interdisciplinary therapy in a comprehensive inpatient rehab setting.  Physiatrist is providing close team supervision and 24 hour management of active medical  problems listed below.  Physiatrist and rehab team continue to assess barriers to discharge/monitor patient progress toward functional and medical goals  Care Tool:  Bathing    Body parts bathed by patient: Right arm, Left arm, Chest, Abdomen, Front perineal area, Left upper leg, Right upper leg, Buttocks, Face, Left lower leg, Right lower leg         Bathing assist Assist Level: Independent with assistive device Assistive Device Comment: shower seat   Upper Body Dressing/Undressing Upper body dressing   What is the patient wearing?: Pull over shirt    Upper body assist Assist Level: Independent    Lower Body Dressing/Undressing Lower body dressing      What is the patient wearing?: Pants, Underwear/pull up     Lower body assist Assist for lower body dressing: Independent     Toileting Toileting    Toileting assist Assist for toileting: Independent     Transfers Chair/bed transfer  Transfers assist     Chair/bed transfer assist level: Independent     Locomotion Ambulation   Ambulation assist      Assist level: Independent Assistive device: No Device Max distance: >150   Walk 10 feet activity   Assist     Assist level: Independent with assistive device Assistive device: No Device   Walk 50 feet activity   Assist    Assist level: Independent with assistive device Assistive device: No Device    Walk 150 feet activity   Assist    Assist level: Independent with assistive device Assistive device: No Device    Walk 10 feet  on uneven surface  activity   Assist     Assist level: Supervision/Verbal cueing     Wheelchair     Assist Will patient use wheelchair at discharge?: No   Wheelchair activity did not occur: N/A         Wheelchair 50 feet with 2 turns activity    Assist    Wheelchair 50 feet with 2 turns activity did not occur: N/A       Wheelchair 150 feet activity     Assist  Wheelchair 150 feet  activity did not occur: N/A       Blood pressure (!) 99/54, pulse (!) 57, temperature 98.6 F (37 C), resp. rate 16, height 5\' 2"  (1.575 m), weight 67.4 kg, last menstrual period 01/20/2013, SpO2 98 %. Medical Problem List and Plan: 1.  Dizziness with gait ataxia secondary to moderate left PICA and small right PICA infarct with left vertebral artery occlusion             -patient may shower             -ELOS/Goals: modI 5-7 days             -DC home. Has regularly scheduled f/u me on August 25th.  2.  Antithrombotics: -DVT/anticoagulation: Lovenox- may discontinue as ambulating 175 feet.              -antiplatelet therapy: Aspirin 325 mg daily and Plavix 25 mg daily x3 months then aspirin alone 3. Pain Management: Increase Neurontin to 600/600/900, hydrocodone as needed for chemotherapy-induced peripheral neuropathy. Tolerated well.  4. Mood: Trazodone 50 mg nightly provide emotional support             -antipsychotic agents: Latuda 40 mg nightly.  Restarted hydroxyzine for anxiety. Anxiety well controlled.  5. Neuropsych: This patient is capable of making decisions on her own behalf. 6. Skin/Wound Care: Routine skin checks 7. Fluids/Electrolytes/Nutrition: Routine in and outs with follow-up chemistries 8.  Hypertension.  Avapro 300 mg daily.  Monitor with increased mobility. Well controlled.  9.  Hyperlipidemia.  Lipitor. Discussed that lipid panel was normal 10.  Left breast cancer.  Continue Arimidex daily   >30 minutes spent in discharge of patient including review of medications and follow-up appointments, discussing anxiety, diet, updating daughter, discussing pain, discussing low carb diet, physical examination, and in answering all patient's questions  LOS: 3 days A FACE TO FACE EVALUATION WAS [place] 06/22/2020, 8:41 AM

## 2020-06-22 NOTE — Plan of Care (Signed)
  Problem: RH SAFETY Goal: RH STG ADHERE TO SAFETY PRECAUTIONS W/ASSISTANCE/DEVICE Description: STG Adhere to Safety Precautions With cues/reminders Assistance/Device. 06/22/2020 1100 by Rodolph Bong, LPN Outcome: Completed/Met 06/22/2020 1100 by Rodolph Bong, LPN Outcome: Progressing Goal: RH STG DECREASED RISK OF FALL WITH ASSISTANCE Description: STG Decreased Risk of Fall With min Assistance. 06/22/2020 1100 by Rodolph Bong, LPN Outcome: Completed/Met 06/22/2020 1100 by Rodolph Bong, LPN Outcome: Progressing   Problem: RH PAIN MANAGEMENT Goal: RH STG PAIN MANAGED AT OR BELOW PT'S PAIN GOAL Description: Less than 3 06/22/2020 1100 by Rodolph Bong, LPN Outcome: Completed/Met 06/22/2020 1100 by Rodolph Bong, LPN Outcome: Progressing   Problem: RH KNOWLEDGE DEFICIT Goal: RH STG INCREASE KNOWLEDGE OF HYPERTENSION Description: Patient will be able to manage HTN; using diet, medications and knowledge of secondary stroke prevention with educational resources independently 06/22/2020 1100 by Rodolph Bong, LPN Outcome: Completed/Met 06/22/2020 1100 by Rodolph Bong, LPN Outcome: Progressing Goal: RH STG INCREASE KNOWLEGDE OF HYPERLIPIDEMIA Description: Patient will be able to manage HLD; using diet, medications and knowledge of secondary stroke prevention with educational resources independently 06/22/2020 1100 by Rodolph Bong, LPN Outcome: Completed/Met 06/22/2020 1100 by Rodolph Bong, LPN Outcome: Progressing Goal: RH STG INCREASE KNOWLEDGE OF STROKE PROPHYLAXIS Description: Patient will be able to demonstrate knowledge of secondary stroke prevention with educational resources independently 06/22/2020 1100 by Rodolph Bong, LPN Outcome: Completed/Met 06/22/2020 1100 by Rodolph Bong, LPN Outcome: Progressing

## 2020-06-23 NOTE — Patient Care Conference (Signed)
Inpatient RehabilitationTeam Conference and Plan of Care Update Date: 06/23/2020   Time: 10:55 AM    Patient Name: Kristi [name]      Medical Record Number: [id number]  Date of Birth: [birth year] Sex: Female         Room/Bed: 4W16C/4W16C-01 Payor Info: Payor: [employer] MEDICARE / Plan: [facility] MEDICARE / Product Type: *No Product type* /    Admit Date/Time:  06/19/2020  2:53 PM  Primary Diagnosis:  Cerebral infarction involving left cerebellar artery [facility])  Hospital Problems: Principal Problem:   Cerebral infarction involving left cerebellar artery [facility])    Expected Discharge Date: Expected Discharge Date: 06/22/20  Team Members Present: Physician leading conference: Dr. Alysia Penna Care Coodinator Present: Dorien Chihuahua, RN, BSN, CRRN;Christina Sampson Goon, [place] Nurse Present: Rayne Du, LPN PT Present: Barrie Folk, PT OT Present: Turner Daniels, OT PPS Coordinator present : Ileana Ladd, Burna Mortimer, SLP     Current Status/Progress Goal Weekly Team Focus  Bowel/Bladder   pt continent of b and b lbm 06/20/20  remain cont of bowel and bladder  assess q shift and prn   Swallow/Nutrition/ Hydration             ADL's   modified independent with all selfcare, independent with transfers as well  independent to modified independent  selfcare retraining, transfer training, balance retraining, DME education, neuromuscular re-education   Mobility             Communication             Safety/Cognition/ Behavioral Observations            Pain   pt complained of 3/10 pain in feet  decrease pain in feet  assess pain q hift and prn    Skin   no current skin breakdown  remain free form break down  assess skin q shift and prn     Discharge Planning:    Home with spouse  Team Discussion: Patient on target for discharge home Patient on target to meet rehab goals: yes, follow up services recommended  *See Care Plan and progress notes for long and  short-term goals.   Revisions to Treatment Plan:   Teaching Needs: Family education completed 06/21/20  Current Barriers to Discharge: None Noted  Possible Resolutions to Barriers:      Medical Summary Current Status: Kristi [name] is stable for discharge- she has achieved modI in ambulation and ADLs. Her pain is better controlled.  Barriers to Discharge: Other (comments)  Barriers to Discharge Comments: Patient is stable for discharge. Possible Resolutions to [employer]: Education of patient and her family regarding holter monitor, anti-inflammatory diet, upward titration of Gabapentin for pain   Continued Need for Acute Rehabilitation Level of Care: The patient requires daily medical management by a physician with specialized training in physical medicine and rehabilitation for the following reasons: Direction of a multidisciplinary physical rehabilitation program to maximize functional independence : Yes Medical management of patient stability for increased activity during participation in an intensive rehabilitation regime.: Yes Analysis of laboratory values and/or radiology reports with any subsequent need for medication adjustment and/or medical intervention. : Yes   I attest that I was present, lead the team conference, and concur with the assessment and plan of the team.   Margarito Liner 06/23/2020, 3:21 PM

## 2020-06-24 ENCOUNTER — Other Ambulatory Visit: Payer: Self-pay

## 2020-06-24 ENCOUNTER — Ambulatory Visit: Payer: Medicare Other | Attending: Physician Assistant

## 2020-06-24 DIAGNOSIS — I63542 Cerebral infarction due to unspecified occlusion or stenosis of left cerebellar artery: Secondary | ICD-10-CM | POA: Insufficient documentation

## 2020-06-24 DIAGNOSIS — R2689 Other abnormalities of gait and mobility: Secondary | ICD-10-CM | POA: Diagnosis not present

## 2020-06-24 DIAGNOSIS — R2681 Unsteadiness on feet: Secondary | ICD-10-CM | POA: Insufficient documentation

## 2020-06-24 DIAGNOSIS — I6300 Cerebral infarction due to thrombosis of unspecified precerebral artery: Secondary | ICD-10-CM | POA: Insufficient documentation

## 2020-06-24 NOTE — Therapy (Signed)
[facility] [address] [name], [name], [address] Phone: [phone]   Fax:  [phone]  Physical Therapy Evaluation  Patient Details  Name: Kristi [name] MRN: [id number] Date of Birth: 1956/09/13 Referring Provider (PT): Lauraine Rinne, [place]   Encounter Date: 06/24/2020   PT End of Session - 06/24/20 [place]    Visit Number 1    Number of Visits 7    Date for PT Re-Evaluation 08/05/20    Progress Note Due on Visit 7    PT Start Time 1450    PT Stop Time 1535    PT Time Calculation (min) 45 min    Equipment Utilized During Treatment Gait belt    Activity Tolerance Patient tolerated treatment well    Behavior During Therapy [facility] for tasks assessed/performed           Past Medical History:  Diagnosis Date  . Anemia    Iron deficinecy anemia  . Anemia 05/14/2017  . Anxiety   . Anxiety and depression 05/15/2014  . Arthritis   . Breast cancer ([place])    left ,last radiation 2'15, last chemo 8'14  . Constipation 11/13/2016  . Depression   . History of chicken pox   . History of radiation therapy 09/09/13-10/28/13   45 gray to left breast, lumpectomy cavity boosted to 63 gray  . HTN (hypertension) 11/13/2016  . Hyperglycemia 01/09/2016  . Hyperlipidemia 05/14/2017  . Hypertension   . MRSA (methicillin resistant Staphylococcus aureus) 2009   right groin area-no issues now. 04-07-14 PCR screen negative today.  . Neuropathy   . Preventative health care 11/13/2016    Past Surgical History:  Procedure Laterality Date  . ANAL SPHINCTEROTOMY  04/2011  . APPENDECTOMY  1980  . AXILLARY LYMPH NODE DISSECTION Left 02/04/2013   Procedure: LEFT AXILLARY LYMPH NODE DISSECTION;  Surgeon: Stark Klein, MD;  Location: [facility];  Service: General;  Laterality: Left;  End: 7672  . BREAST LUMPECTOMY WITH NEEDLE LOCALIZATION Left 02/04/2013   Procedure: LEFT BREAST LUMPECTOMY WITH NEEDLE LOCALIZATION;  Surgeon: Stark Klein, MD;  Location: [place];   Service: General;  Laterality: Left;  . BREAST SURGERY     Lumpectomy in april 2014  . HEMORRHOID SURGERY  04/2011   ligation  . MASTECTOMY Left 02/15/2017  . PORT-A-CATH REMOVAL N/A 04/16/2014   Procedure: REMOVAL PORT-A-CATH;  Surgeon: Stark Klein, MD;  Location: [facility];  Service: General;  Laterality: N/A;  . PORTACATH PLACEMENT Right 02/04/2013   Procedure: INSERTION PORT-A-CATH;  Surgeon: Stark Klein, MD;  Location: [facility];  Service: General;  Laterality: Right;  Start Time: 0947.  Marland Kitchen SHOULDER ARTHROSCOPY WITH ROTATOR CUFF REPAIR AND SUBACROMIAL DECOMPRESSION Left 02/24/2014   Procedure: SHOULDER ARTHROSCOPY WITH ROTATOR CUFF REPAIR AND SUBACROMIAL DECOMPRESSION;  Surgeon: Meredith Pel, MD;  Location: [facility];  Service: Orthopedics;  Laterality: Left;  LEFT SHOULDER DIAGNOSTIC OPERATIVE ARTHROSCOPY, SUBACROMIAL DECOMPRESSION, ROTATOR CUFF TEAR REPAIR  . SIMPLE MASTECTOMY WITH AXILLARY SENTINEL NODE BIOPSY Left 02/15/2017   Procedure: LEFT MASTECTOMY;  Surgeon: Stark Klein, MD;  Location: [facility];  Service: General;  Laterality: Left;  . TOTAL MASTECTOMY Right 12/26/2018   Procedure: RIGHT BREAST PROPHYLATIC MASTECTOMY;  Surgeon: Stark Klein, MD;  Location: [facility];  Service: General;  Laterality: Right;    There were no vitals filed for this visit.    Subjective Assessment - 06/24/20 1555    Subjective Pt reports that she went into ED on 06/15/20 for symptoms of dizziness for  2 days previously and was starting to have headache the day of her admission. She was found to have a stroke    Patient is accompained by: Family member   Daughter   Pertinent History anxiety, left breast cancer with radiation chemotherapy, hyperlipidemia, hypertension    Limitations Walking;House hold activities    How long can you sit comfortably? no issues    How long can you stand comfortably? 10 min    How long can you walk comfortably? 5-10 min    Diagnostic tests IMPRESSION:1. High-grade stenosis at the  origin of the left vertebral arterywith occlusion less than 1 cm from the origin. The vessel isreconstituted at the level of C6.2. The right vertebral artery is dominant.3. High-grade stenosis at the origin of the left external carotidartery.4. Minimal atherosclerotic changes at the right carotid bifurcationand cavernous internal carotid arteries bilaterally withoutsignificant stenosis.5. Minimal vessel wall irregularity in the mid cervical internalcarotid arteries bilaterally without significant stenosis. This mayrepresent fibromuscular dysplasia.6. Multilevel spondylosis of the cervical spine. IMPRESSION:1. Acute/subacute nonhemorrhagic infarct involving the inferomedialleft cerebellum.2. Additional acute/subacute nonhemorrhagic posterior corticalinfarct in the medial right cerebellum.3. No acute or subacute supratentorial infarct.    Patient Stated Goals take showers without shower chair, improve overall endurance, be able to drive    Currently in Pain? No/denies              [facility] PT Assessment - 06/24/20 1558      Assessment   Medical Diagnosis CVA    Referring Provider (PT) Lauraine Rinne, PA    Onset Date/Surgical Date 06/15/20    Prior Therapy inpatient, rehab      Precautions   Precautions Fall      Restrictions   Weight Bearing Restrictions No      Balance Screen   Has the patient fallen in the past 6 months No      [place] residence    Living Arrangements Spouse/significant other    Available Help at Discharge Family    Type of [place] to enter    Entrance Stairs-Number of Steps 2    Entrance Stairs-Rails None    Home Layout Two level    Alternate Level Stairs-Number of Steps 14    Alternate Level Stairs-Rails Right;Left    Home Equipment None      Prior Function   Level of Independence Independent    Vocation Retired      Associate Professor   Overall Cognitive Status Within Functional Limits for tasks  assessed      Ambulation/Gait   Ambulation/Gait Yes    Ambulation/Gait Assistance 7: Independent    Ambulation Distance (Feet) 115 Feet    Assistive device None    Gait Pattern Decreased arm swing - right;Decreased arm swing - left      Standardized Balance Assessment   Standardized Balance Assessment Five Times Sit to Stand;Dynamic Gait Index    Five times sit to stand comments  11 seconds no HHA      Dynamic Gait Index   Level Surface Mild Impairment   decreased arm swings, decreased cadence   Change in Gait Speed Normal    Gait with Horizontal Head Turns Mild Impairment    Gait with Vertical Head Turns Mild Impairment    Gait and Pivot Turn Normal    Step Over Obstacle Normal    Step Around Obstacles Mild Impairment    Steps Normal    Total Score 20  DGI comment: 20/24                      Objective measurements completed on examination: See above findings.               PT Education - 06/24/20 1610    Education Details Educated on vestibular anatomy and physiology and expectations of recovery after stroke. Patient educated on benefits of gradual walking program. Pt educated to make sure she looks down at her feet to scan obstacles when she is walking on grass or negotiating curbs/stairs or obstacles. If pathe is leveled and clear, she can look ahead. Patient educated on gradual walking program to start with 10 min with family supervision and work up to 30 min over next 6 weeks as tolerated.    Person(s) Educated Patient;Child(ren)    Methods Explanation    Comprehension Verbalized understanding            PT Short Term Goals - 06/24/20 1612      PT SHORT TERM GOAL #1   Title STG=LTG             PT Long Term Goals - 06/24/20 1612      PT LONG TERM GOAL #1   Title Patient will be able to ambulate for 30 min without AD and SBA to improve walking endurance    Baseline 10 min    Time 6    Period Weeks    Status New    Target Date  08/05/20      PT LONG TERM GOAL #2   Title Patient will demo 22/24 or better on dynamic gait index to improve balance with functional gait.    Baseline 20/24 (eval)    Time 6    Period Weeks    Status New    Target Date 08/05/20      PT LONG TERM GOAL #3   Title Patient will be able to stand/walk for 2 hours to be able to go shopping    Baseline 10 min    Time 6    Period Weeks    Status New    Target Date 08/05/20                  Plan - 06/24/20 1818    Clinical Impression Statement Patient is a 64 y.o. female who was seen today for physical therapy evaluation and treatment for gait and balance disorder following CVA. Patient currently is able to ambulate without any assistive device but with gait abnormalities of decreased arm swing, decreased cadence, decreased step length. Patient demonstrated 20/24 on dynamic gait index which demonstrates low risk for fall. Patient was able to perform 5x sit to stand in under 11 seconds which demonstrates wihin normal limits of functional strength. Patient will benefit from skilled PT to improve gait and balance to improve functional balance and mobility.    Personal Factors and Comorbidities Past/Current Experience;Comorbidity 2    Comorbidities anxiety, left breast cancer with radiation chemotherapy, hyperlipidemia, hypertension    Examination-Activity Limitations Bathing;Caring for Others;Carry;Locomotion Level;Stairs;Stand    Examination-Participation Restrictions Church;Cleaning;Community Activity;Driving;Shop;Yard Work    [employer] officer Evolving/Moderate complexity    Rehab Potential Good    PT Frequency 1x / week    PT Duration 6 weeks    PT Treatment/Interventions ADLs/Self Care Home Management;Neuromuscular re-education;Balance training;Therapeutic exercise;Therapeutic activities;Functional mobility training;Stair training;Gait training;Patient/family education;Manual techniques;Passive range of motion;Energy  conservation;Vestibular;Joint Manipulations    PT Next Visit Plan asess  for gaze stabilization exercises given from inpatient rehab, issue HEP    PT Home Exercise Plan Walking program    Consulted and Agree with Plan of Care Patient           Patient will benefit from skilled therapeutic intervention in order to improve the following deficits and impairments:  Abnormal gait, Decreased activity tolerance, Decreased balance, Decreased endurance, Difficulty walking, Dizziness, Impaired sensation, Postural dysfunction  Visit Diagnosis: Cerebral infarction involving left cerebellar artery (HCC)  Other abnormalities of gait and mobility  Unsteadiness on feet     Problem List Patient Active Problem List   Diagnosis Date Noted  . Cerebral infarction involving left cerebellar artery ([place]) 06/19/2020  . Cerebrovascular accident (CVA) ([place])   . Vertigo   . Generalized anxiety disorder   . Acute blood loss anemia   . Benign essential HTN   . Dyslipidemia   . Acute CVA (cerebrovascular accident) ([place]) 06/15/2020  . Debility 01/18/2020  . Light headed 01/18/2020  . Mild cognitive impairment 08/24/2019  . Genetic testing 02/20/2019  . S/P mastectomy, right 12/26/2018  . Left upper extremity numbness 06/19/2018  . Cervical cancer screening 06/18/2018  . Anemia 05/14/2017  . Hyperlipidemia 05/14/2017  . History of left breast cancer 02/15/2017  . Nodule of finger of left hand 11/14/2016  . Right hip pain 11/14/2016  . Knee pain, bilateral 11/14/2016  . Constipation 11/13/2016  . Preventative health care 11/13/2016  . HTN (hypertension) 11/13/2016  . Hyperglycemia 01/09/2016  . Neuropathy of hand, left 09/21/2015  . History of chicken pox   . Shingles 05/15/2014  . Anxiety and depression 05/15/2014  . Rotator cuff tear 02/24/2014  . Left shoulder pain with abduction 12/12/2013  . CVA (cerebral infarction) 07/18/2013  . TIA (transient ischemic attack) 07/18/2013  . Contracture  of axilla 05/20/2013  . Primary cancer of upper inner quadrant of left female breast ([place]) 01/24/2013  . Anal fissure 05/03/2011  . Hemorrhoids, internal, with prolapse 05/03/2011    Kerrie Pleasure, PT 06/24/2020, 6:30 PM  [facility] [address] [place], [name], [address] Phone: [phone]   Fax:  [phone]  Name: Kristi [name] MRN: [id number] Date of Birth: 02/08/1956

## 2020-06-28 ENCOUNTER — Encounter: Payer: Self-pay | Admitting: Medical

## 2020-06-28 ENCOUNTER — Ambulatory Visit (INDEPENDENT_AMBULATORY_CARE_PROVIDER_SITE_OTHER): Payer: Medicare Other | Admitting: Medical

## 2020-06-28 ENCOUNTER — Other Ambulatory Visit: Payer: Self-pay

## 2020-06-28 VITALS — BP 100/60 | HR 76 | Temp 98.5°F | Resp 18 | Ht 62.0 in | Wt 156.8 lb

## 2020-06-28 DIAGNOSIS — Z8673 Personal history of transient ischemic attack (TIA), and cerebral infarction without residual deficits: Secondary | ICD-10-CM

## 2020-06-28 DIAGNOSIS — R42 Dizziness and giddiness: Secondary | ICD-10-CM

## 2020-06-28 DIAGNOSIS — D649 Anemia, unspecified: Secondary | ICD-10-CM

## 2020-06-28 DIAGNOSIS — L80 Vitiligo: Secondary | ICD-10-CM

## 2020-06-28 DIAGNOSIS — I10 Essential (primary) hypertension: Secondary | ICD-10-CM | POA: Diagnosis not present

## 2020-06-28 NOTE — Patient Instructions (Signed)
For history of stroke recently, continue with Plavix and aspirin as advised by neurologist and physical medicine on discharge.  For history of hypertension continue with current valsartan/HCTZ daily.  But do check your blood pressures twice daily and update me by my chart on the reading.  If blood pressures are consistently all lower and then might need to make an adjustment but do not want to do that prematurely.  Also make note if you are getting dizzy/lightheaded on standing and ambulating.  Went ahead and placed referral to cardiologist for potential cardiac monitoring to evaluate for possible atrial fibrillation.  Keep neurology follow-up.  You are on waiting list and seeing them earlier might be beneficial.  You had recent dizziness and heavy headed sensation intermittently on standing.  However subsides with rest.  You had very good neurologic exam today with no deficits.  I do not think repeat imaging necessary presently.  If you have any worsening or changing signs symptoms let me know.  If that occurs after hours and significant/severely go ahead and be seen in ED at the med center.  Referred to dermatologist for vitiligo.  Will get CBC and metabolic panel as well.  Will evaluate electrolytes and make sure anemia level stable.  Follow-up date to be determined after lab review.

## 2020-06-28 NOTE — Progress Notes (Signed)
Subjective:    Patient ID: Kristi [name], female    DOB: [birth year], 64 y.o.   MRN: [id number]  HPI  Pt in for follow up. Admit date: 06/15/2020 Discharge date: 06/19/2020  Then went to rehab for 3 days.  After rehab med list dc  Medications at discharge. 1.  Tylenol as needed 2.  Aspirin 325 mg p.o. daily 3.  Plavix 25 mg p.o. daily 4.  Neurontin 600/600/900mg   5.  Hydrocodone 1 tablet every 6 hours as needed pain 6.  Valsartan-HCTZ 1 tablet daily 7.  Latuda 40 mg p.o. nightly 8.  Trazodone 50 mg p.o. nightly 9.  Magnesium 250 mg daily 10.Arimidex 1 mg daily 11.atarax 50 mg daily   Dc from hospital recommendation. Recommendations for Outpatient Follow-up and new medication changes:  1. Follow up with Dr. Charlett Blake in 7 days after discharge from inpatient rehab.  2. Follow up with neurology as outpatient as scheduled.(pt is on calcellation list presently but has appointment scheduled for oct 27. 2021) 3. Patient has been placed on full dose aspirin 325 mg and clopidogrel 75 mg, to continue for 3 months and then continue aspirin alone.  4. Started on statin therapy with atorvastatin 40 mg daily.  5. Patient will need 30 day cardiac event monitoring as outpatient to rule out atrial fibrillation.    Brief/Interim Summary: Patient admitted to the hospital with acute/subacute nonhemorrhagic infarct involving the inferior medial left cerebellum and posterior cortical medial right cerebellum.  64 year old female who presented with dizziness. She does have the significant past medical history for breast cancer, hypertension, anemia, neuropathy and depression. Patient reported intermittent but persistent dizziness over the last 48 hours prior to hospitalization. On her initial physical examination blood pressure 101/51, heart rate 83, respiratory rate 23, temperature 98.6, oxygen saturation 98%,her lungswere clear to auscultation bilaterally, heart S1-S2, present rhythmic, soft  abdomen, no lower extremity edema. Sodium 137, potassium 4.1, chloride 99, bicarb 29, glucose 105, BUN 14, creatinine 1.0, white count 6.5, hemoglobin 9.5, hematocrit 35.7, platelets 183.  SARS COVID-19 negative. Head CT showed a low-density area in the inferior left cerebellar hemisphere compatible with acute infarction. Brain MRI with acute/subacute nonhemorrhagic infarct involving the inferior medial left cerebellum. Acute/subacute nonhemorrhagic posterior cortical infarct in the medial right cerebellum. EKG 57 bpm, normal axis, normal intervals, sinus rhythm, no ST segment T wave changes, positive LVH.  Further work up withCT angiography showed high grade stenosis at the origin of the left vertebral artery with occlusion less than 1 cm from the origin. Positive reconstitution. Right vertebral artery is dominant. High grade stenosis at the origin of the left external carotid artery.  Patient was placed on dual antiplatelet therapy with good toleration. Continue close neuro checks, for possible development of hydrocephalus or transtentorial herniation.  Follow up CT head on 06/18/20 showed evolving infarcts in the medial aspect of the cerebellar hemispheres bilaterally left greater than right similar to prior brain MRI.  1.  Acute/subacute nonhemorrhagic infarct inferior medial left cerebellum and posterior cortical medial right cerebellum.  Patient was admitted to the medical ward, she was placed on a remote telemetry monitor.  She had frequent neuro checks, physical therapy, occupational therapy and neurology evaluations.  Patient will had further work-up with echocardiography which showed a preserved LV systolic function, 60 to 78% ejection fraction, no thrombotic or embolic phenomena.  Patient was placed on dual antiplatelet therapy along with statin, with good toleration. Because of location of acute/subacute CVA in the posterior fossa, a  follow-up head CT was performed to rule  out any development of hydrocephalus or transtentorial herniation.  Imaging was negative for this complications.  Her symptoms have been improving, she will be transferred to inpatient rehab continue physical/occupational therapy.  2.  Hypertension/dyslipidemia.  Continue blood pressure control with valsartan and hctz, continue atorvastatin. Her lipid profile showed LDL of 89, HDL 41, cholesterol 144, triglycerides 68.  3.  Mild hypercalcemia.  Multifactorial, clinically resolved.  4.  Neuropathy.  Continue gabapentin.  Dose was reduced to 700 mg twice daily. Continue with as needed hydrocodone - acetaminophen.   5.  History of breast cancer.  Continue anastrozole.  Follow-up as an outpatient.  6.  Depression/ anxiety .  Continue Latuda, hydroxizine and trazodone.  7.  Chronic anemia. It remained stable, follow-up as an outpatient.  Discharge Diagnoses:  Principal Problem:   Acute CVA (cerebrovascular accident) [facility]) Active Problems:   Primary cancer of upper inner quadrant of left female breast ([place])   HTN (hypertension)   Cerebrovascular accident (CVA) ([place])   Vertigo   Generalized anxiety disorder   Benign essential HTN   Dyslipidemia    Pt feels some slight light headed and faint mild head heaviness feel. Transient when first stands up. Then subsides.  Pt in PT for 6 weeks.   Review of Systems  Constitutional: Negative for chills, fatigue and fever.  Respiratory: Negative for cough, chest tightness, shortness of breath and wheezing.   Cardiovascular: Negative for chest pain and palpitations.  Gastrointestinal: Negative for abdominal pain.  Musculoskeletal: Negative for back pain.  Skin: Negative for rash.  Neurological: Positive for dizziness. Negative for speech difficulty, weakness and headaches.  Hematological: Negative for adenopathy. Does not bruise/bleed easily.  Psychiatric/Behavioral: Negative for behavioral problems, dysphoric mood, self-injury and  suicidal ideas. The patient is not nervous/anxious.     Past Medical History:  Diagnosis Date   Anemia    Iron deficinecy anemia   Anemia 05/14/2017   Anxiety    Anxiety and depression 05/15/2014   Arthritis    Breast cancer ([place])    left ,last radiation 2'15, last chemo 8'14   Constipation 11/13/2016   Depression    History of chicken pox    History of radiation therapy 09/09/13-10/28/13   45 gray to left breast, lumpectomy cavity boosted to 63 gray   HTN (hypertension) 11/13/2016   Hyperglycemia 01/09/2016   Hyperlipidemia 05/14/2017   Hypertension    MRSA (methicillin resistant Staphylococcus aureus) 2009   right groin area-no issues now. 04-07-14 PCR screen negative today.   Neuropathy    Preventative health care 11/13/2016     Social History   Socioeconomic History   Marital status: Married    Spouse name: [name]   Number of children: 1   Years of education: 12   Highest education level: Not on file  Occupational History   Occupation: INVENTORY Hotel manager: [employer]  Tobacco Use   Smoking status: Never Smoker   Smokeless tobacco: Never Used  Vaping Use   Vaping Use: Never used  Substance and Sexual Activity   Alcohol use: No   Drug use: No   Sexual activity: Yes    Comment: lives with husband, disability/retirement. [employer], no dietary restrictions  Other Topics Concern   Not on file  Social History Narrative   Patient is married [name]) and lives at home with her husband.   Patient has one daughter.   Patient is working at  [employer]   Patient has a 12th grade education.   Patient drinks very little caffeine.   Social Determinants of Health   Financial Resource Strain: Low Risk    Difficulty of Paying Living Expenses: Not hard at all  Food Insecurity: No Food Insecurity   Worried About Charity fundraiser in the Last Year: Never true   [place] in the Last Year: Never true  Transportation  Needs: No Transportation Needs   Lack of Transportation (Medical): No   Lack of Transportation (Non-Medical): No  Physical Activity:    Days of Exercise per Week: Not on file   Minutes of Exercise per Session: Not on file  Stress:    Feeling of Stress : Not on file  Social Connections:    Frequency of Communication with Friends and Family: Not on file   Frequency of Social Gatherings with Friends and Family: Not on file   Attends Religious Services: Not on file   Active Member of [place] or Organizations: Not on file   Attends Archivist Meetings: Not on file   Marital Status: Not on file  Intimate Partner Violence:    Fear of Current or Ex-Partner: Not on file   Emotionally Abused: Not on file   Physically Abused: Not on file   Sexually Abused: Not on file    Past Surgical History:  Procedure Laterality Date   ANAL SPHINCTEROTOMY  04/2011   [place] Left 02/04/2013   Procedure: LEFT AXILLARY LYMPH NODE DISSECTION;  Surgeon: Stark Klein, MD;  Location: [place];  Service: General;  Laterality: Left;  End: 6433   BREAST LUMPECTOMY WITH NEEDLE LOCALIZATION Left 02/04/2013   Procedure: LEFT BREAST LUMPECTOMY WITH NEEDLE LOCALIZATION;  Surgeon: Stark Klein, MD;  Location: [place];  Service: General;  Laterality: Left;   BREAST SURGERY     Lumpectomy in april 2014   [place]  04/2011   ligation   MASTECTOMY Left 02/15/2017   PORT-A-CATH REMOVAL N/A 04/16/2014   Procedure: REMOVAL PORT-A-CATH;  Surgeon: Stark Klein, MD;  Location: [facility];  Service: General;  Laterality: N/A;   PORTACATH PLACEMENT Right 02/04/2013   Procedure: INSERTION PORT-A-CATH;  Surgeon: Stark Klein, MD;  Location: [place];  Service: General;  Laterality: Right;  Start Time: 2951.   SHOULDER ARTHROSCOPY WITH ROTATOR CUFF REPAIR AND SUBACROMIAL DECOMPRESSION Left 02/24/2014   Procedure: SHOULDER ARTHROSCOPY WITH ROTATOR CUFF REPAIR AND  SUBACROMIAL DECOMPRESSION;  Surgeon: Meredith Pel, MD;  Location: [place];  Service: Orthopedics;  Laterality: Left;  LEFT SHOULDER DIAGNOSTIC OPERATIVE ARTHROSCOPY, SUBACROMIAL DECOMPRESSION, ROTATOR CUFF TEAR REPAIR   SIMPLE MASTECTOMY WITH AXILLARY SENTINEL NODE BIOPSY Left 02/15/2017   Procedure: LEFT MASTECTOMY;  Surgeon: Stark Klein, MD;  Location: [place];  Service: General;  Laterality: Left;   TOTAL MASTECTOMY Right 12/26/2018   Procedure: RIGHT BREAST PROPHYLATIC MASTECTOMY;  Surgeon: Stark Klein, MD;  Location: [place];  Service: General;  Laterality: Right;    Family History  Problem Relation Age of Onset   Lung cancer Father    Hypertension Father    Thyroid cancer Father        dx in his 64s   Cancer Father        lung, thyroid, smoker   Breast cancer Paternal Aunt 68   Colon cancer Paternal Aunt        dx in her 50x   Cervical cancer Paternal Aunt  dzx in her 14s   Ovarian cancer Cousin        dx in her lage 46s   Breast cancer Cousin        maternal first cousin, once removed; dx in her late 38s   Breast cancer Cousin        maternal first cousin once removed; dx in late 51s   Hypertension Mother    Diabetes Mother    Dementia Mother    Memory loss Mother    Hypertension Brother    Seizures Brother        Alcohol induced.   Alcohol abuse Brother        drinker, smoker   Cancer Paternal Uncle        oral cancer   Kidney cancer Paternal Grandmother    Arthritis Daughter        back surgery   Cancer Cousin        several paternal cousins with brain cancer, leukemia, and other cancers   Cancer Sister        stomach    No Known Allergies  Current Outpatient Medications on File Prior to Visit  Medication Sig Dispense Refill   anastrozole (ARIMIDEX) 1 MG tablet Take 1 tablet (1 mg total) by mouth daily. TAKE 1 TABLET (1 MG) BY MOUTH DAILY IN THE MORNING (Patient taking differently: Take 1 mg by mouth daily. ) 90 tablet 3    Apoaequorin (PREVAGEN) 10 MG CAPS Take 1 capsule by mouth daily.     aspirin EC 325 MG EC tablet Take 1 tablet (325 mg total) by mouth daily. 30 tablet 0   atorvastatin (LIPITOR) 40 MG tablet Take 1 tablet (40 mg total) by mouth daily. 30 tablet 0   clopidogrel (PLAVIX) 75 MG tablet Take 1 tablet (75 mg total) by mouth daily. 30 tablet 0   gabapentin (NEURONTIN) 300 MG capsule Take 2 capsules (600mg ) in the morning and early afternoon; then take 3 capsules (900mg ) at bedtime. 210 capsule 2   HYDROcodone-acetaminophen (NORCO) 10-325 MG tablet Take 1 tablet by mouth every 6 (six) hours as needed. 30 tablet 0   hydrOXYzine (ATARAX/VISTARIL) 50 MG tablet Take 1 tablet (50 mg total) by mouth 2 (two) times daily. 30 tablet 0   LATUDA 80 MG TABS tablet Take 0.5 tablets (40 mg total) by mouth at bedtime. 30 tablet 1   Magnesium 250 MG TABS Take 1 tablet (250 mg total) by mouth daily. 30 tablet 0   Multiple Vitamin (MULTIVITAMIN WITH MINERALS) TABS tablet Take 1 tablet by mouth daily.     polyethylene glycol (MIRALAX / GLYCOLAX) 17 g packet Take 17 g by mouth daily as needed for mild constipation. 14 each 0   traZODone (DESYREL) 50 MG tablet Take 1 tablet (50 mg total) by mouth at bedtime. 30 tablet 0   valsartan-hydrochlorothiazide (DIOVAN-HCT) 320-25 MG tablet TAKE 1 TABLET BY MOUTH EVERY DAY IN THE MORNING 90 tablet 1   [DISCONTINUED] prochlorperazine (COMPAZINE) 10 MG tablet Take 1 tablet (10 mg total) by mouth every 6 (six) hours as needed (Nausea or vomiting). 30 tablet 1   [DISCONTINUED] prochlorperazine (COMPAZINE) 25 MG suppository Place 1 suppository (25 mg total) rectally every 12 (twelve) hours as needed for nausea. 12 suppository 3   No current facility-administered medications on file prior to visit.    BP 99/62    Pulse 76    Temp 98.5 F (36.9 C) (Oral)    Resp 18    Ht  5\' 2"  (1.575 m)    Wt 156 lb 12.8 oz (71.1 kg)    LMP 01/20/2013    SpO2 98%    BMI 28.68 kg/m         Objective:   Physical Exam   General Mental Status- Alert. General Appearance- Not in acute distress.   Skin General: Color- Normal Color. Moisture- Normal Moisture.  Neck Carotid Arteries- Normal color. Moisture- Normal Moisture. No carotid bruits. No JVD.  Chest and Lung Exam Auscultation: Breath Sounds:-Normal.  Cardiovascular Auscultation:Rythm- Regular. Murmurs & Other Heart Sounds:Auscultation of the heart reveals- No Murmurs.  Abdomen Inspection:-Inspeection Normal. Palpation/Percussion:Note:No mass. Palpation and Percussion of the abdomen reveal- Non Tender, Non Distended + BS, no rebound or guarding.    Neurologic Cranial Nerve exam:- CN III-XII intact(No nystagmus), symmetric smile. Drift Test:- No drift. Romberg Exam:- Negative.  Heal to Toe Gait exam:-Normal. Finger to Nose:- Normal/Intact Strength:- 5/5 equal and symmetric strength both upper and lower extremities.     Assessment & Plan:  For history of stroke recently, continue with Plavix and aspirin as advised by neurologist and physical medicine on discharge.  For history of hypertension continue with current valsartan/HCTZ daily.  But do check your blood pressures twice daily and update me by my chart on the reading.  If blood pressures are consistently all lower and then might need to make an adjustment but do not want to do that prematurely.  Also make note if you are getting dizzy/lightheaded on standing and ambulating.  Went ahead and placed referral to cardiologist for potential cardiac monitoring to evaluate for possible atrial fibrillation.  Keep neurology follow-up.  You are on waiting list and seeing them earlier might be beneficial.  You had recent dizziness and heavy headed sensation intermittently on standing.  However subsides with rest.  You had very good neurologic exam today with no deficits.  I do not think repeat imaging necessary presently.  If you have any worsening or changing  signs symptoms let me know.  If that occurs after hours and significant/severely go ahead and be seen in ED at the med center.  Referred to dermatologist for vitiligo.  Will get CBC and metabolic panel as well.  Will evaluate electrolytes and make sure anemia level stable.  Follow-up date to be determined after lab review.  Mackie Pai, PA-C   Time spent with patient today was 40  minutes which consisted of chart review, discussing diagnosis, work up treatment and documentation.

## 2020-06-29 LAB — COMPREHENSIVE METABOLIC PANEL
AG Ratio: 1.5 (calc) (ref 1.0–2.5)
ALT: 13 U/L (ref 6–29)
AST: 17 U/L (ref 10–35)
Albumin: 4.2 g/dL (ref 3.6–5.1)
Alkaline phosphatase (APISO): 83 U/L (ref 37–153)
BUN/Creatinine Ratio: 10 (calc) (ref 6–22)
BUN: 13 mg/dL (ref 7–25)
CO2: 31 mmol/L (ref 20–32)
Calcium: 10.4 mg/dL (ref 8.6–10.4)
Chloride: 101 mmol/L (ref 98–110)
Creat: 1.27 mg/dL — ABNORMAL HIGH (ref 0.50–0.99)
Globulin: 2.8 g/dL (calc) (ref 1.9–3.7)
Glucose, Bld: 95 mg/dL (ref 65–99)
Potassium: 4.7 mmol/L (ref 3.5–5.3)
Sodium: 140 mmol/L (ref 135–146)
Total Bilirubin: 0.3 mg/dL (ref 0.2–1.2)
Total Protein: 7 g/dL (ref 6.1–8.1)

## 2020-06-29 LAB — CBC WITH DIFFERENTIAL/PLATELET
Absolute Monocytes: 370 cells/uL (ref 200–950)
Basophils Absolute: 22 cells/uL (ref 0–200)
Basophils Relative: 0.4 %
Eosinophils Absolute: 129 cells/uL (ref 15–500)
Eosinophils Relative: 2.3 %
HCT: 33.1 % — ABNORMAL LOW (ref 35.0–45.0)
Hemoglobin: 11 g/dL — ABNORMAL LOW (ref 11.7–15.5)
Lymphs Abs: 2134 cells/uL (ref 850–3900)
MCH: 28.1 pg (ref 27.0–33.0)
MCHC: 33.2 g/dL (ref 32.0–36.0)
MCV: 84.7 fL (ref 80.0–100.0)
MPV: 10.4 fL (ref 7.5–12.5)
Monocytes Relative: 6.6 %
Neutro Abs: 2946 cells/uL (ref 1500–7800)
Neutrophils Relative %: 52.6 %
Platelets: 208 10*3/uL (ref 140–400)
RBC: 3.91 10*6/uL (ref 3.80–5.10)
RDW: 14.9 % (ref 11.0–15.0)
Total Lymphocyte: 38.1 %
WBC: 5.6 10*3/uL (ref 3.8–10.8)

## 2020-06-29 LAB — IRON: Iron: 43 ug/dL — ABNORMAL LOW (ref 45–160)

## 2020-06-30 ENCOUNTER — Encounter
Payer: Medicare Other | Attending: Physical Medicine and Rehabilitation | Admitting: Physical Medicine and Rehabilitation

## 2020-06-30 ENCOUNTER — Other Ambulatory Visit: Payer: Self-pay

## 2020-06-30 ENCOUNTER — Encounter: Payer: Self-pay | Admitting: Physical Medicine and Rehabilitation

## 2020-06-30 VITALS — BP 99/64 | HR 77 | Temp 98.7°F | Ht 62.0 in | Wt 157.6 lb

## 2020-06-30 DIAGNOSIS — T451X5A Adverse effect of antineoplastic and immunosuppressive drugs, initial encounter: Secondary | ICD-10-CM

## 2020-06-30 DIAGNOSIS — Z5181 Encounter for therapeutic drug level monitoring: Secondary | ICD-10-CM | POA: Insufficient documentation

## 2020-06-30 DIAGNOSIS — G620 Drug-induced polyneuropathy: Secondary | ICD-10-CM

## 2020-06-30 DIAGNOSIS — Z79891 Long term (current) use of opiate analgesic: Secondary | ICD-10-CM | POA: Insufficient documentation

## 2020-06-30 DIAGNOSIS — G894 Chronic pain syndrome: Secondary | ICD-10-CM | POA: Diagnosis not present

## 2020-06-30 DIAGNOSIS — I63542 Cerebral infarction due to unspecified occlusion or stenosis of left cerebellar artery: Secondary | ICD-10-CM

## 2020-06-30 MED ORDER — VALSARTAN-HYDROCHLOROTHIAZIDE 160-12.5 MG PO TABS
1.0000 | ORAL_TABLET | Freq: Every day | ORAL | 3 refills | Status: DC
Start: 2020-06-30 — End: 2020-09-23

## 2020-06-30 MED ORDER — HYDROCODONE-ACETAMINOPHEN 10-325 MG PO TABS: 1.0000 | ORAL_TABLET | Freq: Three times a day (TID) | ORAL | 0 refills | Status: DC | PRN

## 2020-06-30 NOTE — Progress Notes (Signed)
Subjective:    Patient ID: Kristi [name], female    DOB: Mar 25, 1956, 64 y.o.   MRN: [id number]  HPI Kristi [name] is a 64 year old female who presents for follow-up after CIR admission for cerebral artery stroke.   Pain is worse when she is walking. It is worse in her left hand and bilateral legs. She has been taking two of the Norco per day and 600/600/900 at night. Currently pain is 5/10.   Her PT wanted her to go outside and walk. She is able to walk unassisted. Her goal is to get 10 minutes. She has been doing strengthening exercises for both upper body as well as balance.   She has follow-up with neurology and cardiology scheduled. The earliest her cardiology appointment could be scheduled was on September 27th. She was supposed to get a Holter monitor at this visit.   Her BP in office is 99/64. Her daughter brings in a log of her appointments at home and shares this with me. They have been low to well controlled and she has been dizzy.   Pain Inventory Average Pain 5 Pain Right Now 5 My pain is constant, burning, tingling and aching  In the last 24 hours, has pain interfered with the following? General activity 5 Relation with others 5 Enjoyment of life 4 What TIME of day is your pain at its worst? daytime, evening and night Sleep (in general) Good  Pain is worse with: walking, sitting, standing and some activites Pain improves with: medication Relief from Meds: 6  Family History  Problem Relation Age of Onset  . Lung cancer Father   . Hypertension Father   . Thyroid cancer Father        dx in his 77s  . Cancer Father        lung, thyroid, smoker  . Breast cancer Paternal Aunt 17  . Colon cancer Paternal Aunt        dx in her 50x  . Cervical cancer Paternal Aunt        dzx in her 73s  . Ovarian cancer Cousin        dx in her lage 12s  . Breast cancer Cousin        maternal first cousin, once removed; dx in her late 56s  . Breast cancer Cousin        maternal  first cousin once removed; dx in late 43s  . Hypertension Mother   . Diabetes Mother   . Dementia Mother   . Memory loss Mother   . Hypertension Brother   . Seizures Brother        Alcohol induced.  . Alcohol abuse Brother        drinker, smoker  . Cancer Paternal Uncle        oral cancer  . Kidney cancer Paternal Grandmother   . Arthritis Daughter        back surgery  . Cancer Cousin        several paternal cousins with brain cancer, leukemia, and other cancers  . Cancer Sister        stomach   Social History   Socioeconomic History  . Marital status: Married    Spouse name: [name]  . Number of children: 1  . Years of education: 55  . Highest education level: Not on file  Occupational History  . 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 Smoking  status: Never Smoker  . Smokeless tobacco: Never Used  Vaping Use  . Vaping Use: Never used  Substance and Sexual Activity  . Alcohol use: No  . Drug use: No  . Sexual activity: Yes    Comment: lives with husband, disability/retirement. [employer], no dietary restrictions  Other Topics Concern  . Not on file  Social History Narrative   Patient is married [name]) and lives at home with her husband.   Patient has one daughter.   Patient is working at [employer]   Patient has a 12th grade education.   Patient drinks very little caffeine.   Social Determinants of Health   Financial Resource Strain: Low Risk   . Difficulty of Paying Living Expenses: Not hard at all  Food Insecurity: No Food Insecurity  . Worried About Charity fundraiser in the Last Year: Never true  . Ran Out of Food in the Last Year: Never true  Transportation Needs: No Transportation Needs  . Lack of Transportation (Medical): No  . Lack of Transportation (Non-Medical): No  Physical Activity:   . Days of Exercise per Week: Not on file  . Minutes of Exercise per Session: Not on file  Stress:   . Feeling  of Stress : Not on file  Social Connections:   . Frequency of Communication with Friends and Family: Not on file  . Frequency of Social Gatherings with Friends and Family: Not on file  . Attends Religious Services: Not on file  . Active Member of Clubs or Organizations: Not on file  . Attends Archivist Meetings: Not on file  . Marital Status: Not on file   Past Surgical History:  Procedure Laterality Date  . ANAL SPHINCTEROTOMY  04/2011  . APPENDECTOMY  1980  . AXILLARY LYMPH NODE DISSECTION Left 02/04/2013   Procedure: LEFT AXILLARY LYMPH NODE DISSECTION;  Surgeon: Stark Klein, MD;  Location: [place];  Service: General;  Laterality: Left;  End: 7673  . BREAST LUMPECTOMY WITH NEEDLE LOCALIZATION Left 02/04/2013   Procedure: LEFT BREAST LUMPECTOMY WITH NEEDLE LOCALIZATION;  Surgeon: Stark Klein, MD;  Location: [place];  Service: General;  Laterality: Left;  . BREAST SURGERY     Lumpectomy in april 2014  . HEMORRHOID SURGERY  04/2011   ligation  . MASTECTOMY Left 02/15/2017  . PORT-A-CATH REMOVAL N/A 04/16/2014   Procedure: REMOVAL PORT-A-CATH;  Surgeon: Stark Klein, MD;  Location[facility];  Service: General;  Laterality: N/A;  . PORTACATH PLACEMENT Right 02/04/2013   Procedure: INSERTION PORT-A-CATH;  Surgeon: Stark Klein, MD;  Location: [facility];  Service: General;  Laterality: Right;  Start Time: 4193.  Marland Kitchen SHOULDER ARTHROSCOPY WITH ROTATOR CUFF REPAIR AND SUBACROMIAL DECOMPRESSION Left 02/24/2014   Procedure: SHOULDER ARTHROSCOPY WITH ROTATOR CUFF REPAIR AND SUBACROMIAL DECOMPRESSION;  Surgeon: Meredith Pel, MD;  Location: [facility];  Service: Orthopedics;  Laterality: Left;  LEFT SHOULDER DIAGNOSTIC OPERATIVE ARTHROSCOPY, SUBACROMIAL DECOMPRESSION, ROTATOR CUFF TEAR REPAIR  . SIMPLE MASTECTOMY WITH AXILLARY SENTINEL NODE BIOPSY Left 02/15/2017   Procedure: LEFT MASTECTOMY;  Surgeon: Stark Klein, MD;  Location: [facility];  Service: General;  Laterality: Left;  . TOTAL MASTECTOMY Right  12/26/2018   Procedure: RIGHT BREAST PROPHYLATIC MASTECTOMY;  Surgeon: Stark Klein, MD;  Location: [facility];  Service: General;  Laterality: Right;   Past Surgical History:  Procedure Laterality Date  . ANAL SPHINCTEROTOMY  04/2011  . APPENDECTOMY  1980  . AXILLARY LYMPH NODE DISSECTION Left 02/04/2013   Procedure: LEFT AXILLARY  LYMPH NODE DISSECTION;  Surgeon: Stark Klein, MD;  Location: [facility];  Service: General;  Laterality: Left;  End: 8295  . BREAST LUMPECTOMY WITH NEEDLE LOCALIZATION Left 02/04/2013   Procedure: LEFT BREAST LUMPECTOMY WITH NEEDLE LOCALIZATION;  Surgeon: Stark Klein, MD;  Location: [place];  Service: General;  Laterality: Left;  . BREAST SURGERY     Lumpectomy in april 2014  . HEMORRHOID SURGERY  04/2011   ligation  . MASTECTOMY Left 02/15/2017  . PORT-A-CATH REMOVAL N/A 04/16/2014   Procedure: REMOVAL PORT-A-CATH;  Surgeon: Stark Klein, MD;  [facility];  Service: General;  Laterality: N/A;  . PORTACATH PLACEMENT Right 02/04/2013   Procedure: INSERTION PORT-A-CATH;  Surgeon: Stark Klein, MD;  Location: [place];  Service: General;  Laterality: Right;  Start Time: 6213.  Marland Kitchen SHOULDER ARTHROSCOPY WITH ROTATOR CUFF REPAIR AND SUBACROMIAL DECOMPRESSION Left 02/24/2014   Procedure: SHOULDER ARTHROSCOPY WITH ROTATOR CUFF REPAIR AND SUBACROMIAL DECOMPRESSION;  Surgeon: Meredith Pel, MD;  Location: [facility];  Service: Orthopedics;  Laterality: Left;  LEFT SHOULDER DIAGNOSTIC OPERATIVE ARTHROSCOPY, SUBACROMIAL DECOMPRESSION, ROTATOR CUFF TEAR REPAIR  . SIMPLE MASTECTOMY WITH AXILLARY SENTINEL NODE BIOPSY Left 02/15/2017   Procedure: LEFT MASTECTOMY;  Surgeon: Stark Klein, MD;  Location: [place];  Service: General;  Laterality: Left;  . TOTAL MASTECTOMY Right 12/26/2018   Procedure: RIGHT BREAST PROPHYLATIC MASTECTOMY;  Surgeon: Stark Klein, MD;  Location: [facility];  Service: General;  Laterality: Right;   Past Medical History:  Diagnosis Date  . Anemia    Iron deficinecy anemia  .  Anemia 05/14/2017  . Anxiety   . Anxiety and depression 05/15/2014  . Arthritis   . Breast cancer ([place])    left ,last radiation 2'15, last chemo 8'14  . Constipation 11/13/2016  . Depression   . History of chicken pox   . History of radiation therapy 09/09/13-10/28/13   45 gray to left breast, lumpectomy cavity boosted to 63 gray  . HTN (hypertension) 11/13/2016  . Hyperglycemia 01/09/2016  . Hyperlipidemia 05/14/2017  . Hypertension   . MRSA (methicillin resistant Staphylococcus aureus) 2009   right groin area-no issues now. 04-07-14 PCR screen negative today.  . Neuropathy   . Preventative health care 11/13/2016  . Stroke ([place]) 06/2020   BP 99/64   Pulse 77   Temp 98.7 F (37.1 C)   Ht 5\' 2"  (1.575 m)   Wt 157 lb 9.6 oz (71.5 kg)   LMP 01/20/2013   SpO2 97%   BMI 28.83 kg/m   Opioid Risk Score:   Fall Risk Score:  `1  Depression screen PHQ 2/9  Depression screen [facility] 2/9 05/26/2020 05/05/2020 05/02/2019 04/30/2018 10/12/2016  Decreased Interest 0 2 0 0 0  Down, Depressed, Hopeless 0 1 0 1 0  PHQ - 2 Score 0 3 0 1 0  Altered sleeping - 2 - - -  Tired, decreased energy - 3 - - -  Change in appetite - 2 - - -  Feeling bad or failure about yourself  - 1 - - -  Trouble concentrating - 1 - - -  Moving slowly or fidgety/restless - 1 - - -  Suicidal thoughts - 0 - - -  PHQ-9 Score - 13 - - -  Some encounter information is confidential and restricted. Go to Review Flowsheets activity to see all data.  Some recent data might be hidden   Review of Systems     Objective:   Physical Exam Gen: no distress, normal appearing  HEENT: oral mucosa pink and moist, NCAT Cardio: Reg rate Chest: normal effort, normal rate of breathing Abd: soft, non-distended Ext: no edema Skin: intact Neuro: Alert and oriented x3. Able to walk in and out of the office well.  Musculoskeletal: 5/5 strength in bilateral lower extremities and and upper extremities. Sensation intact throughout.  Psych: pleasant,  normal affect    Assessment & Plan:  Mrs. Breidenbach is a 64 year old woman who presents with bilateral foot painful neuropathy since starting Anastrazole chemotherapy for breast cancer in 2015. She was also recently admitted to [facility] for left cerebellar artery stroke.   -Reviewed CIR notes.   -Continue Gabapentin 600/600/800mg . She does not require refill.   -Pain contract signed previously.   -UDS obtained previously. Personally reviewed and contains expected metabolites.   -PDMP reviewed. Norco 10mg  up to 3 times daily prescribed. MME 30.   -Provided referral to [facility] for evaluation for lumbar sympathetic nerve block.   Hypotension: 99/64 in office. Reviewed log. Has been low to well controlled. Prescribed half dose of her Valsartan-HCTZ for her to alternate every day with there current dose. Continue to log pressures and bring to follow-up appointments with me and her PCP. Normalizing her BP should help her dizziness. Explained mechanism for orthostatic hypotension.   General health: Educated regarding low carb/sugar diet.   All questions answered. RTC in 6 weeks. I have sent 6 week supply of her medication.

## 2020-07-01 ENCOUNTER — Ambulatory Visit: Payer: Medicare Other

## 2020-07-01 DIAGNOSIS — R2689 Other abnormalities of gait and mobility: Secondary | ICD-10-CM

## 2020-07-01 DIAGNOSIS — I63542 Cerebral infarction due to unspecified occlusion or stenosis of left cerebellar artery: Secondary | ICD-10-CM | POA: Diagnosis not present

## 2020-07-01 DIAGNOSIS — I6300 Cerebral infarction due to thrombosis of unspecified precerebral artery: Secondary | ICD-10-CM

## 2020-07-01 DIAGNOSIS — R2681 Unsteadiness on feet: Secondary | ICD-10-CM | POA: Diagnosis not present

## 2020-07-01 NOTE — Therapy (Signed)
[facility] [address] [place], [name], [address] Phone: [phone]   Fax:  [phone]  Physical Therapy Treatment  Patient Details  Name: Kristi [name] MRN: [id number] Date of Birth: [birth year] Referring Provider (PT): Lauraine Rinne, [place]   Encounter Date: 07/01/2020   PT End of Session - 07/01/20 1812    Visit Number 2    Number of Visits 7    Date for PT Re-Evaluation 08/05/20    Progress Note Due on Visit 7    PT Start Time 1750    PT Stop Time 1830    PT Time Calculation (min) 40 min    Equipment Utilized During Treatment Gait belt    Activity Tolerance Patient tolerated treatment well    Behavior During Therapy [facility] for tasks assessed/performed           Past Medical History:  Diagnosis Date   Anemia    Iron deficinecy anemia   Anemia 05/14/2017   Anxiety    Anxiety and depression 05/15/2014   Arthritis    Breast cancer ([place])    left ,last radiation 2'15, last chemo 8'14   Constipation 11/13/2016   Depression    History of chicken pox    History of radiation therapy 09/09/13-10/28/13   45 gray to left breast, lumpectomy cavity boosted to 63 gray   HTN (hypertension) 11/13/2016   Hyperglycemia 01/09/2016   Hyperlipidemia 05/14/2017   Hypertension    MRSA (methicillin resistant Staphylococcus aureus) 2009   right groin area-no issues now. 04-07-14 PCR screen negative today.   Neuropathy    Preventative health care 11/13/2016   Stroke ([place]) 06/2020    Past Surgical History:  Procedure Laterality Date   ANAL SPHINCTEROTOMY  04/2011   APPENDECTOMY  1980   AXILLARY LYMPH NODE DISSECTION Left 02/04/2013   Procedure: LEFT AXILLARY LYMPH NODE DISSECTION;  Surgeon: Stark Klein, MD;  Location: [place];  Service: General;  Laterality: Left;  End: 4825   BREAST LUMPECTOMY WITH NEEDLE LOCALIZATION Left 02/04/2013   Procedure: LEFT BREAST LUMPECTOMY WITH NEEDLE LOCALIZATION;  Surgeon: Stark Klein,  MD;  Location: [place];  Service: General;  Laterality: Left;   BREAST SURGERY     Lumpectomy in april 2014   [place]  04/2011   ligation   MASTECTOMY Left 02/15/2017   PORT-A-CATH REMOVAL N/A 04/16/2014   Procedure: REMOVAL PORT-A-CATH;  Surgeon: Stark Klein, MD;  [facility];  Service: General;  Laterality: N/A;   PORTACATH PLACEMENT Right 02/04/2013   Procedure: INSERTION PORT-A-CATH;  Surgeon: Stark Klein, MD;  Location: [place];  Service: General;  Laterality: Right;  Start Time: 0037.   SHOULDER ARTHROSCOPY WITH ROTATOR CUFF REPAIR AND SUBACROMIAL DECOMPRESSION Left 02/24/2014   Procedure: SHOULDER ARTHROSCOPY WITH ROTATOR CUFF REPAIR AND SUBACROMIAL DECOMPRESSION;  Surgeon: Meredith Pel, MD;  Location: [place];  Service: Orthopedics;  Laterality: Left;  LEFT SHOULDER DIAGNOSTIC OPERATIVE ARTHROSCOPY, SUBACROMIAL DECOMPRESSION, ROTATOR CUFF TEAR REPAIR   SIMPLE MASTECTOMY WITH AXILLARY SENTINEL NODE BIOPSY Left 02/15/2017   Procedure: LEFT MASTECTOMY;  Surgeon: Stark Klein, MD;  Location: [place];  Service: General;  Laterality: Left;   TOTAL MASTECTOMY Right 12/26/2018   Procedure: RIGHT BREAST PROPHYLATIC MASTECTOMY;  Surgeon: Stark Klein, MD;  Location: [place];  Service: General;  Laterality: Right;    There were no vitals filed for this visit.   Subjective Assessment - 07/01/20 1802    Subjective Pt reports she is doing okay. She is walking up  to 10 min right now.    Patient is accompained by: Family member   Daughter   Pertinent History anxiety, left breast cancer with radiation chemotherapy, hyperlipidemia, hypertension    Limitations Walking;House hold activities    How long can you sit comfortably? no issues    How long can you stand comfortably? 10 min    How long can you walk comfortably? 5-10 min    Diagnostic tests IMPRESSION:1. High-grade stenosis at the origin of the left vertebral arterywith occlusion less than 1 cm from the origin. The vessel  isreconstituted at the level of C6.2. The right vertebral artery is dominant.3. High-grade stenosis at the origin of the left external carotidartery.4. Minimal atherosclerotic changes at the right carotid bifurcationand cavernous internal carotid arteries bilaterally withoutsignificant stenosis.5. Minimal vessel wall irregularity in the mid cervical internalcarotid arteries bilaterally without significant stenosis. This mayrepresent fibromuscular dysplasia.6. Multilevel spondylosis of the cervical spine. IMPRESSION:1. Acute/subacute nonhemorrhagic infarct involving the inferomedialleft cerebellum.2. Additional acute/subacute nonhemorrhagic posterior corticalinfarct in the medial right cerebellum.3. No acute or subacute supratentorial infarct.    Patient Stated Goals take showers without shower chair, improve overall endurance, be able to drive               Sit to SL with head rotation towards ceiling: R and L- pt reported more sensitivity with lying on R side with head turn to L Given patient modified Laruth Bouchard doroff with just lying on R side: 5 x waiting 1' in sidelying and 1 min in sitting  Standing gaze stabilization: 20x horizontal, 20x vertical, 20x diagonal, patient standing on floor with wide BOS  Tandem walking fwd and bwd: 5 x 10 steps with intermittent 1 HHA  Sit to stand with 5lbs 2 x 10                       PT Short Term Goals - 06/24/20 1612      PT SHORT TERM GOAL #1   Title STG=LTG             PT Long Term Goals - 06/24/20 1612      PT LONG TERM GOAL #1   Title Patient will be able to ambulate for 30 min without AD and SBA to improve walking endurance    Baseline 10 min    Time 6    Period Weeks    Status New    Target Date 08/05/20      PT LONG TERM GOAL #2   Title Patient will demo 22/24 or better on dynamic gait index to improve balance with functional gait.    Baseline 20/24 (eval)    Time 6    Period Weeks    Status New    Target  Date 08/05/20      PT LONG TERM GOAL #3   Title Patient will be able to stand/walk for 2 hours to be able to go shopping    Baseline 10 min    Time 6    Period Weeks    Status New    Target Date 08/05/20                 Plan - 07/01/20 1809    Clinical Impression Statement Patient did not have nystagmus with positional testing but had increased sensitivity with lying on R side. Patient was progressed her gaze stabilization from sitting position to standing position.    Personal Factors and Comorbidities Past/Current Experience;Comorbidity 2  Comorbidities anxiety, left breast cancer with radiation chemotherapy, hyperlipidemia, hypertension    Examination-Activity Limitations Bathing;Caring for Others;Carry;Locomotion Level;Stairs;Stand    Examination-Participation Restrictions Church;Cleaning;Community Activity;Driving;Shop;Yard Work    [employer] Evolving/Moderate complexity    Rehab Potential Good    PT Frequency 1x / week    PT Duration 6 weeks    PT Treatment/Interventions ADLs/Self Care Home Management;Neuromuscular re-education;Balance training;Therapeutic exercise;Therapeutic activities;Functional mobility training;Stair training;Gait training;Patient/family education;Manual techniques;Passive range of motion;Energy conservation;Vestibular;Joint Manipulations    PT Next Visit Plan progress gaze stabilization to standing with narrow BOS next session.    PT Home Exercise Plan Walking program; [id number]    Consulted and Agree with Plan of Care Patient           Patient will benefit from skilled therapeutic intervention in order to improve the following deficits and impairments:  Abnormal gait, Decreased activity tolerance, Decreased balance, Decreased endurance, Difficulty walking, Dizziness, Impaired sensation, Postural dysfunction  Visit Diagnosis: Cerebral infarction involving left cerebellar artery ([place])  Other abnormalities of gait and  mobility  Unsteadiness on feet  Cerebrovascular accident (CVA) due to thrombosis of precerebral artery [facility])     Problem List Patient Active Problem List   Diagnosis Date Noted   Cerebral infarction involving left cerebellar artery ([place]) 06/19/2020   Cerebrovascular accident (CVA) ([place])    Vertigo    Generalized anxiety disorder    Acute blood loss anemia    Benign essential HTN    Dyslipidemia    Acute CVA (cerebrovascular accident) ([place]) 06/15/2020   Debility 01/18/2020   Light headed 01/18/2020   Mild cognitive impairment 08/24/2019   Genetic testing 02/20/2019   S/P mastectomy, right 12/26/2018   Left upper extremity numbness 06/19/2018   Cervical cancer screening 06/18/2018   Anemia 05/14/2017   Hyperlipidemia 05/14/2017   History of left breast cancer 02/15/2017   Nodule of finger of left hand 11/14/2016   Right hip pain 11/14/2016   Knee pain, bilateral 11/14/2016   Constipation 11/13/2016   Preventative health care 11/13/2016   HTN (hypertension) 11/13/2016   Hyperglycemia 01/09/2016   Neuropathy of hand, left 09/21/2015   History of chicken pox    Shingles 05/15/2014   Anxiety and depression 05/15/2014   Rotator cuff tear 02/24/2014   Left shoulder pain with abduction 12/12/2013   CVA (cerebral infarction) 07/18/2013   TIA (transient ischemic attack) 07/18/2013   Contracture of axilla 05/20/2013   Primary cancer of upper inner quadrant of left female breast ([place]) 01/24/2013   Anal fissure 05/03/2011   Hemorrhoids, internal, with prolapse 05/03/2011    Kerrie Pleasure 07/01/2020, 6:14 PM  [facility] [address] [place], [name], [address] Phone: [phone]   Fax:  [phone]  Name: Kristi [name] MRN: [id number] Date of Birth: [birth year]

## 2020-07-01 NOTE — Patient Instructions (Signed)
Access Code: [email or web address] URL: [email or web address] Date: 07/01/2020 Prepared by: Markus Jarvis  Exercises Brandt-Daroff Vestibular Exercise - 2 x daily - 7 x weekly - 1 sets - 5 reps Standing Diagonal Head Nod Vestibular Habituation - 2 x daily - 7 x weekly - 2 sets - 10 reps Standing Gaze Stabilization with Head Nod - 2 x daily - 7 x weekly - 1 sets - 20 reps Standing Gaze Stabilization with Head Rotation - 2 x daily - 7 x weekly - 2 sets - 20 reps

## 2020-07-09 ENCOUNTER — Ambulatory Visit: Payer: Medicare Other | Attending: Physician Assistant

## 2020-07-09 ENCOUNTER — Other Ambulatory Visit: Payer: Self-pay

## 2020-07-09 DIAGNOSIS — R2689 Other abnormalities of gait and mobility: Secondary | ICD-10-CM | POA: Diagnosis not present

## 2020-07-09 DIAGNOSIS — R2681 Unsteadiness on feet: Secondary | ICD-10-CM | POA: Insufficient documentation

## 2020-07-09 DIAGNOSIS — I6300 Cerebral infarction due to thrombosis of unspecified precerebral artery: Secondary | ICD-10-CM | POA: Diagnosis not present

## 2020-07-09 DIAGNOSIS — I63542 Cerebral infarction due to unspecified occlusion or stenosis of left cerebellar artery: Secondary | ICD-10-CM | POA: Insufficient documentation

## 2020-07-09 NOTE — Therapy (Signed)
[facility] [address] [place], [name], [address] Phone: [phone]   Fax:  [phone]  Physical Therapy Treatment  Patient Details  Name: Kristi [name] MRN: [id number] Date of Birth: 1956-05-26 Referring Provider (PT): Lauraine Rinne, [place]   Encounter Date: 07/09/2020   PT End of Session - 07/09/20 1500    Visit Number 3    Number of Visits 7    Date for PT Re-Evaluation 08/05/20    Progress Note Due on Visit 7    PT Start Time 0174    PT Stop Time 1530    PT Time Calculation (min) 45 min    Equipment Utilized During Treatment Gait belt    Activity Tolerance Patient tolerated treatment well    Behavior During Therapy [facility] for tasks assessed/performed           Past Medical History:  Diagnosis Date  . Anemia    Iron deficinecy anemia  . Anemia 05/14/2017  . Anxiety   . Anxiety and depression 05/15/2014  . Arthritis   . Breast cancer ([place])    left ,last radiation 2'15, last chemo 8'14  . Constipation 11/13/2016  . Depression   . History of chicken pox   . History of radiation therapy 09/09/13-10/28/13   45 gray to left breast, lumpectomy cavity boosted to 63 gray  . HTN (hypertension) 11/13/2016  . Hyperglycemia 01/09/2016  . Hyperlipidemia 05/14/2017  . Hypertension   . MRSA (methicillin resistant Staphylococcus aureus) 2009   right groin area-no issues now. 04-07-14 PCR screen negative today.  . Neuropathy   . Preventative health care 11/13/2016  . Stroke [facility]) 06/2020    Past Surgical History:  Procedure Laterality Date  . ANAL SPHINCTEROTOMY  04/2011  . APPENDECTOMY  1980  . AXILLARY LYMPH NODE DISSECTION Left 02/04/2013   Procedure: LEFT AXILLARY LYMPH NODE DISSECTION;  Surgeon: Stark Klein, MD;  Location: [place];  Service: General;  Laterality: Left;  End: 9449  . BREAST LUMPECTOMY WITH NEEDLE LOCALIZATION Left 02/04/2013   Procedure: LEFT BREAST LUMPECTOMY WITH NEEDLE LOCALIZATION;  Surgeon: Stark Klein,  MD;  Location: [place];  Service: General;  Laterality: Left;  . BREAST SURGERY     Lumpectomy in april 2014  . HEMORRHOID SURGERY  04/2011   ligation  . MASTECTOMY Left 02/15/2017  . PORT-A-CATH REMOVAL N/A 04/16/2014   Procedure: REMOVAL PORT-A-CATH;  Surgeon: Stark Klein, MD;  [facility];  Service: General;  Laterality: N/A;  . PORTACATH PLACEMENT Right 02/04/2013   Procedure: INSERTION PORT-A-CATH;  Surgeon: Stark Klein, MD;  Location: [place];  Service: General;  Laterality: Right;  Start Time: 6759.  Marland Kitchen SHOULDER ARTHROSCOPY WITH ROTATOR CUFF REPAIR AND SUBACROMIAL DECOMPRESSION Left 02/24/2014   Procedure: SHOULDER ARTHROSCOPY WITH ROTATOR CUFF REPAIR AND SUBACROMIAL DECOMPRESSION;  Surgeon: Meredith Pel, MD;  Location: [place];  Service: Orthopedics;  Laterality: Left;  LEFT SHOULDER DIAGNOSTIC OPERATIVE ARTHROSCOPY, SUBACROMIAL DECOMPRESSION, ROTATOR CUFF TEAR REPAIR  . SIMPLE MASTECTOMY WITH AXILLARY SENTINEL NODE BIOPSY Left 02/15/2017   Procedure: LEFT MASTECTOMY;  Surgeon: Stark Klein, MD;  Location: [place];  Service: General;  Laterality: Left;  . TOTAL MASTECTOMY Right 12/26/2018   Procedure: RIGHT BREAST PROPHYLATIC MASTECTOMY;  Surgeon: Stark Klein, MD;  Location: [place];  Service: General;  Laterality: Right;    There were no vitals filed for this visit.   Subjective Assessment - 07/09/20 1450    Subjective Pt reports she is walking up to 16 min. Her  symptoms are little better. Can I drive?    Patient is accompained by: Family member   Daughter   Pertinent History anxiety, left breast cancer with radiation chemotherapy, hyperlipidemia, hypertension    Limitations Walking;House hold activities    How long can you sit comfortably? no issues    How long can you stand comfortably? 10 min    How long can you walk comfortably? 5-10 min    Diagnostic tests IMPRESSION:1. High-grade stenosis at the origin of the left vertebral arterywith occlusion less than 1 cm from the origin.  The vessel isreconstituted at the level of C6.2. The right vertebral artery is dominant.3. High-grade stenosis at the origin of the left external carotidartery.4. Minimal atherosclerotic changes at the right carotid bifurcationand cavernous internal carotid arteries bilaterally withoutsignificant stenosis.5. Minimal vessel wall irregularity in the mid cervical internalcarotid arteries bilaterally without significant stenosis. This mayrepresent fibromuscular dysplasia.6. Multilevel spondylosis of the cervical spine. IMPRESSION:1. Acute/subacute nonhemorrhagic infarct involving the inferomedialleft cerebellum.2. Additional acute/subacute nonhemorrhagic posterior corticalinfarct in the medial right cerebellum.3. No acute or subacute supratentorial infarct.    Patient Stated Goals take showers without shower chair, improve overall endurance, be able to drive                Sitting: checking blindspots: R and L with quick head turns: waiting for symptoms to settle before she repeats: 10x R and L  Seated Nose to knee: 10x R and L  Walking with eyes closed: 2 x 20 feet, min A required Walking tandem with eyes open: 30 feet, CGA- pt did report light dizziness at end  Seated rest break  Standing on on airex foam: narrow BOS with EC: 2 x 1'   BP measured: 100/68 (trial 1); 110/66 (trial 2)  Nustep: level 3 for 10' at end of the session                     PT Education - 07/09/20 1508    Education Details Pt educated to concult her MD for clearance to drive. Patient educated if she gets clearance, she should start with parking lot, then smaller streets and progress to bigger st. as she feels comfortable. Patient to practice checking blind spots at home while sitting in the chair.    Person(s) Educated Patient    Methods Explanation    Comprehension Verbalized understanding            PT Short Term Goals - 06/24/20 1612      PT SHORT TERM GOAL #1   Title STG=LTG               PT Long Term Goals - 06/24/20 1612      PT LONG TERM GOAL #1   Title Patient will be able to ambulate for 30 min without AD and SBA to improve walking endurance    Baseline 10 min    Time 6    Period Weeks    Status New    Target Date 08/05/20      PT LONG TERM GOAL #2   Title Patient will demo 22/24 or better on dynamic gait index to improve balance with functional gait.    Baseline 20/24 (eval)    Time 6    Period Weeks    Status New    Target Date 08/05/20      PT LONG TERM GOAL #3   Title Patient will be able to stand/walk for 2 hours to be able to go  shopping    Baseline 10 min    Time 6    Period Weeks    Status New    Target Date 08/05/20                 Plan - 07/09/20 1456    Clinical Impression Statement Today's skilled session was focused on desensitizing smptoms of lightheadedness with functional head movements. Pt still reports light headedness but it is resolving quickly.    Personal Factors and Comorbidities Past/Current Experience;Comorbidity 2    Comorbidities anxiety, left breast cancer with radiation chemotherapy, hyperlipidemia, hypertension    Examination-Activity Limitations Bathing;Caring for Others;Carry;Locomotion Level;Stairs;Stand    Examination-Participation Restrictions Church;Cleaning;Community Activity;Driving;Shop;Yard Work    [employer]chant [employer] officer Evolving/Moderate complexity    Rehab Potential Good    PT Frequency 1x / week    PT Duration 6 weeks    PT Treatment/Interventions ADLs/Self Care Home Management;Neuromuscular re-education;Balance training;Therapeutic exercise;Therapeutic activities;Functional mobility training;Stair training;Gait training;Patient/family education;Manual techniques;Passive range of motion;Energy conservation;Vestibular;Joint Manipulations    PT Next Visit Plan progress gaze stabilization to standing with narrow BOS next session.    PT Home Exercise Plan Walking program; [id number]     Consulted and Agree with Plan of Care Patient           Patient will benefit from skilled therapeutic intervention in order to improve the following deficits and impairments:  Abnormal gait, Decreased activity tolerance, Decreased balance, Decreased endurance, Difficulty walking, Dizziness, Impaired sensation, Postural dysfunction  Visit Diagnosis: Cerebral infarction involving left cerebellar artery (HCC)  Other abnormalities of gait and mobility  Unsteadiness on feet  Cerebrovascular accident (CVA) due to thrombosis of precerebral artery [facility])     Problem List Patient Active Problem List   Diagnosis Date Noted  . Cerebral infarction involving left cerebellar artery ([place]) 06/19/2020  . Cerebrovascular accident (CVA) ([place])   . Vertigo   . Generalized anxiety disorder   . Acute blood loss anemia   . Benign essential HTN   . Dyslipidemia   . Acute CVA (cerebrovascular accident) ([place]) 06/15/2020  . Debility 01/18/2020  . Light headed 01/18/2020  . Mild cognitive impairment 08/24/2019  . Genetic testing 02/20/2019  . S/P mastectomy, right 12/26/2018  . Left upper extremity numbness 06/19/2018  . Cervical cancer screening 06/18/2018  . Anemia 05/14/2017  . Hyperlipidemia 05/14/2017  . History of left breast cancer 02/15/2017  . Nodule of finger of left hand 11/14/2016  . Right hip pain 11/14/2016  . Knee pain, bilateral 11/14/2016  . Constipation 11/13/2016  . Preventative health care 11/13/2016  . HTN (hypertension) 11/13/2016  . Hyperglycemia 01/09/2016  . Neuropathy of hand, left 09/21/2015  . History of chicken pox   . Shingles 05/15/2014  . Anxiety and depression 05/15/2014  . Rotator cuff tear 02/24/2014  . Left shoulder pain with abduction 12/12/2013  . CVA (cerebral infarction) 07/18/2013  . TIA (transient ischemic attack) 07/18/2013  . Contracture of axilla 05/20/2013  . Primary cancer of upper inner quadrant of left female breast ([place]) 01/24/2013  . Anal  fissure 05/03/2011  . Hemorrhoids, internal, with prolapse 05/03/2011    Kerrie Pleasure 07/09/2020, 3:29 PM  [facility] [address] [place], [name], [address] Phone: [phone]   Fax:  [phone]  Name: EVALINE WALTMAN MRN: [id number] Date of Birth: 1955/12/14

## 2020-07-13 ENCOUNTER — Telehealth: Payer: Self-pay | Admitting: *Deleted

## 2020-07-13 NOTE — Telephone Encounter (Signed)
Called, thank you!

## 2020-07-13 NOTE — Telephone Encounter (Signed)
Kristi [name] called and is asking for a call to discuss when she can drive again.  Her # is [phone].

## 2020-07-16 ENCOUNTER — Other Ambulatory Visit: Payer: Self-pay

## 2020-07-16 ENCOUNTER — Ambulatory Visit: Payer: Medicare Other

## 2020-07-16 DIAGNOSIS — I6300 Cerebral infarction due to thrombosis of unspecified precerebral artery: Secondary | ICD-10-CM | POA: Diagnosis not present

## 2020-07-16 DIAGNOSIS — I63542 Cerebral infarction due to unspecified occlusion or stenosis of left cerebellar artery: Secondary | ICD-10-CM

## 2020-07-16 DIAGNOSIS — R2689 Other abnormalities of gait and mobility: Secondary | ICD-10-CM | POA: Diagnosis not present

## 2020-07-16 DIAGNOSIS — R2681 Unsteadiness on feet: Secondary | ICD-10-CM

## 2020-07-16 NOTE — Therapy (Signed)
[name] [address] [place], [name], [address] Phone: [phone]   Fax:  [phone]  Physical Therapy Treatment  Patient Details  Name: Kristi [name] MRN: [id number] Date of Birth: Jan 25, 1956 Referring Provider (PT): Lauraine Rinne, [place]   Encounter Date: 07/16/2020   PT End of Session - 07/16/20 1503    Visit Number 4    Number of Visits 7    Date for PT Re-Evaluation 08/05/20    Progress Note Due on Visit 7    PT Start Time 3094    PT Stop Time 1530    PT Time Calculation (min) 45 min    Equipment Utilized During Treatment Gait belt    Activity Tolerance Patient tolerated treatment well    Behavior During Therapy [facility] for tasks assessed/performed           Past Medical History:  Diagnosis Date  . Anemia    Iron deficinecy anemia  . Anemia 05/14/2017  . Anxiety   . Anxiety and depression 05/15/2014  . Arthritis   . Breast cancer ([place])    left ,last radiation 2'15, last chemo 8'14  . Constipation 11/13/2016  . Depression   . History of chicken pox   . History of radiation therapy 09/09/13-10/28/13   45 gray to left breast, lumpectomy cavity boosted to 63 gray  . HTN (hypertension) 11/13/2016  . Hyperglycemia 01/09/2016  . Hyperlipidemia 05/14/2017  . Hypertension   . MRSA (methicillin resistant Staphylococcus aureus) 2009   right groin area-no issues now. 04-07-14 PCR screen negative today.  . Neuropathy   . Preventative health care 11/13/2016  . Stroke [facility]) 06/2020    Past Surgical History:  Procedure Laterality Date  . ANAL SPHINCTEROTOMY  04/2011  . APPENDECTOMY  1980  . AXILLARY LYMPH NODE DISSECTION Left 02/04/2013   Procedure: LEFT AXILLARY LYMPH NODE DISSECTION;  Surgeon: Stark Klein, MD;  Location: [place];  Service: General;  Laterality: Left;  End: 0768  . BREAST LUMPECTOMY WITH NEEDLE LOCALIZATION Left 02/04/2013   Procedure: LEFT BREAST LUMPECTOMY WITH NEEDLE LOCALIZATION;  Surgeon: Stark Klein,  MD;  Location: [place];  Service: General;  Laterality: Left;  . BREAST SURGERY     Lumpectomy in april 2014  . HEMORRHOID SURGERY  04/2011   ligation  . MASTECTOMY Left 02/15/2017  . PORT-A-CATH REMOVAL N/A 04/16/2014   Procedure: REMOVAL PORT-A-CATH;  Surgeon: Stark Klein, MD;  [facility];  Service: General;  Laterality: N/A;  . PORTACATH PLACEMENT Right 02/04/2013   Procedure: INSERTION PORT-A-CATH;  Surgeon: Stark Klein, MD;  Location: [place];  Service: General;  Laterality: Right;  Start Time: 0881.  Marland Kitchen SHOULDER ARTHROSCOPY WITH ROTATOR CUFF REPAIR AND SUBACROMIAL DECOMPRESSION Left 02/24/2014   Procedure: SHOULDER ARTHROSCOPY WITH ROTATOR CUFF REPAIR AND SUBACROMIAL DECOMPRESSION;  Surgeon: Meredith Pel, MD;  Location: [place];  Service: Orthopedics;  Laterality: Left;  LEFT SHOULDER DIAGNOSTIC OPERATIVE ARTHROSCOPY, SUBACROMIAL DECOMPRESSION, ROTATOR CUFF TEAR REPAIR  . SIMPLE MASTECTOMY WITH AXILLARY SENTINEL NODE BIOPSY Left 02/15/2017   Procedure: LEFT MASTECTOMY;  Surgeon: Stark Klein, MD;  Location: [place];  Service: General;  Laterality: Left;  . TOTAL MASTECTOMY Right 12/26/2018   Procedure: RIGHT BREAST PROPHYLATIC MASTECTOMY;  Surgeon: Stark Klein, MD;  Location: [place];  Service: General;  Laterality: Right;    There were no vitals filed for this visit.   Subjective Assessment - 07/16/20 1450    Subjective Pt reports she is walking up to 25 min. Yesterday  while walking, she felt unsteadiness. Dizziness is not constant any more since about 1 week.    Patient is accompained by: Family member   Daughter   Pertinent History anxiety, left breast cancer with radiation chemotherapy, hyperlipidemia, hypertension    Limitations Walking;House hold activities    How long can you sit comfortably? no issues    How long can you stand comfortably? 10 min    How long can you walk comfortably? 5-10 min    Diagnostic tests IMPRESSION:1. High-grade stenosis at the origin of the left  vertebral arterywith occlusion less than 1 cm from the origin. The vessel isreconstituted at the level of C6.2. The right vertebral artery is dominant.3. High-grade stenosis at the origin of the left external carotidartery.4. Minimal atherosclerotic changes at the right carotid bifurcationand cavernous internal carotid arteries bilaterally withoutsignificant stenosis.5. Minimal vessel wall irregularity in the mid cervical internalcarotid arteries bilaterally without significant stenosis. This mayrepresent fibromuscular dysplasia.6. Multilevel spondylosis of the cervical spine. IMPRESSION:1. Acute/subacute nonhemorrhagic infarct involving the inferomedialleft cerebellum.2. Additional acute/subacute nonhemorrhagic posterior corticalinfarct in the medial right cerebellum.3. No acute or subacute supratentorial infarct.    Patient Stated Goals take showers without shower chair, improve overall endurance, be able to drive                Gait training: 1/2 horizontal and vertical head turns: 3 x 30 Feet Fast and slow walking: 5; Walking on grass: 230' min A required for balance, cues for looking down and keeping feet wide to improve BOS Fwd and bwd walking: 20 feet x 3 Marching on Airex: 20x Turning 360 deg on airex: 3x cw, 3x ccw Standing wide BOS with EC on airex: 30 sec; with narrow BOS: EC on airex 30 sec Walking figure 8 aroudn 4 cones: cues for keeping feet wide and not letting them cross over when turning, heel to toe contact                        PT Short Term Goals - 06/24/20 1612      PT SHORT TERM GOAL #1   Title STG=LTG             PT Long Term Goals - 06/24/20 1612      PT LONG TERM GOAL #1   Title Patient will be able to ambulate for 30 min without AD and SBA to improve walking endurance    Baseline 10 min    Time 6    Period Weeks    Status New    Target Date 08/05/20      PT LONG TERM GOAL #2   Title Patient will demo 22/24 or better on  dynamic gait index to improve balance with functional gait.    Baseline 20/24 (eval)    Time 6    Period Weeks    Status New    Target Date 08/05/20      PT LONG TERM GOAL #3   Title Patient will be able to stand/walk for 2 hours to be able to go shopping    Baseline 10 min    Time 6    Period Weeks    Status New    Target Date 08/05/20                 Plan - 07/16/20 1455    Clinical Impression Statement Pt demo decreased gait speed and gait deviations with full vertical head turns. When head turns were broken down  in to 1/2 range she was able to maintain balance and slower gait speed.    Personal Factors and Comorbidities Past/Current Experience;Comorbidity 2    Comorbidities anxiety, left breast cancer with radiation chemotherapy, hyperlipidemia, hypertension    Examination-Activity Limitations Bathing;Caring for Others;Carry;Locomotion Level;Stairs;Stand    Examination-Participation Restrictions Church;Cleaning;Community Activity;Driving;Shop;Yard Work    [employer]chant [employer] officer Evolving/Moderate complexity    Rehab Potential Good    PT Frequency 1x / week    PT Duration 6 weeks    PT Treatment/Interventions ADLs/Self Care Home Management;Neuromuscular re-education;Balance training;Therapeutic exercise;Therapeutic activities;Functional mobility training;Stair training;Gait training;Patient/family education;Manual techniques;Passive range of motion;Energy conservation;Vestibular;Joint Manipulations    PT Next Visit Plan progress gaze stabilization to standing with narrow BOS next session.    PT Home Exercise Plan Walking program; [id number]    Consulted and Agree with Plan of Care Patient           Patient will benefit from skilled therapeutic intervention in order to improve the following deficits and impairments:  Abnormal gait, Decreased activity tolerance, Decreased balance, Decreased endurance, Difficulty walking, Dizziness, Impaired sensation, Postural  dysfunction  Visit Diagnosis: Cerebral infarction involving left cerebellar artery (HCC)  Other abnormalities of gait and mobility  Unsteadiness on feet  Cerebrovascular accident (CVA) due to thrombosis of precerebral artery [facility])     Problem List Patient Active Problem List   Diagnosis Date Noted  . Cerebral infarction involving left cerebellar artery ([place]) 06/19/2020  . Cerebrovascular accident (CVA) ([place])   . Vertigo   . Generalized anxiety disorder   . Acute blood loss anemia   . Benign essential HTN   . Dyslipidemia   . Acute CVA (cerebrovascular accident) ([place]) 06/15/2020  . Debility 01/18/2020  . Light headed 01/18/2020  . Mild cognitive impairment 08/24/2019  . Genetic testing 02/20/2019  . S/P mastectomy, right 12/26/2018  . Left upper extremity numbness 06/19/2018  . Cervical cancer screening 06/18/2018  . Anemia 05/14/2017  . Hyperlipidemia 05/14/2017  . History of left breast cancer 02/15/2017  . Nodule of finger of left hand 11/14/2016  . Right hip pain 11/14/2016  . Knee pain, bilateral 11/14/2016  . Constipation 11/13/2016  . Preventative health care 11/13/2016  . HTN (hypertension) 11/13/2016  . Hyperglycemia 01/09/2016  . Neuropathy of hand, left 09/21/2015  . History of chicken pox   . Shingles 05/15/2014  . Anxiety and depression 05/15/2014  . Rotator cuff tear 02/24/2014  . Left shoulder pain with abduction 12/12/2013  . CVA (cerebral infarction) 07/18/2013  . TIA (transient ischemic attack) 07/18/2013  . Contracture of axilla 05/20/2013  . Primary cancer of upper inner quadrant of left female breast ([place]) 01/24/2013  . Anal fissure 05/03/2011  . Hemorrhoids, internal, with prolapse 05/03/2011    Kerrie Pleasure, PT 07/16/2020, 3:31 PM  [facility] [address] [place], [name], [address] Phone: [phone]   Fax:  [phone]  Name: MARIBEL HADLEY MRN: [id number] Date of  Birth: 10-30-1956

## 2020-07-19 ENCOUNTER — Other Ambulatory Visit: Payer: Self-pay | Admitting: Family Medicine

## 2020-07-19 ENCOUNTER — Other Ambulatory Visit: Payer: Self-pay

## 2020-07-19 DIAGNOSIS — C50212 Malignant neoplasm of upper-inner quadrant of left female breast: Secondary | ICD-10-CM

## 2020-07-19 MED ORDER — ATORVASTATIN CALCIUM 40 MG PO TABS: 40.0000 mg | ORAL_TABLET | Freq: Every day | ORAL | 1 refills | Status: DC

## 2020-07-19 MED ORDER — CLOPIDOGREL BISULFATE 75 MG PO TABS: 75.0000 mg | ORAL_TABLET | Freq: Every day | ORAL | 1 refills | Status: AC

## 2020-07-19 NOTE — Telephone Encounter (Signed)
Patient has been notified, two prescriptions has been sent to [facility].

## 2020-07-19 NOTE — Telephone Encounter (Signed)
CallerTekeyah Santiago  Call Back # [phone]  Patient is requesting re-fills on medication :   Medication: atorvastatin (LIPITOR) 40 MG tablet [[id number]]   clopidogrel (PLAVIX) 75 MG tablet [[id number]]  Has the patient contacted their pharmacy? No. (If no, request that the patient contact the pharmacy for the refill.) (If yes, when and what did the pharmacy advise?)  Preferred Pharmacy (with phone number or street name): [id number] - [place], [place] [address]  [place], [name] [address]  Phone:  [phone] Fax:  [phone]  DEA #:  [id number]  Agent: Please be advised that RX refills may take up to 3 business days. We ask that you follow-up with your pharmacy.

## 2020-07-22 ENCOUNTER — Other Ambulatory Visit: Payer: Self-pay

## 2020-07-22 ENCOUNTER — Ambulatory Visit: Payer: Medicare Other

## 2020-07-22 DIAGNOSIS — R2689 Other abnormalities of gait and mobility: Secondary | ICD-10-CM | POA: Diagnosis not present

## 2020-07-22 DIAGNOSIS — I6300 Cerebral infarction due to thrombosis of unspecified precerebral artery: Secondary | ICD-10-CM

## 2020-07-22 DIAGNOSIS — I63542 Cerebral infarction due to unspecified occlusion or stenosis of left cerebellar artery: Secondary | ICD-10-CM

## 2020-07-22 DIAGNOSIS — R2681 Unsteadiness on feet: Secondary | ICD-10-CM | POA: Diagnosis not present

## 2020-07-22 NOTE — Therapy (Signed)
[facility] [address] [place], [name], [address] Phone: [phone]   Fax:  [phone]  Physical Therapy Treatment  Patient Details  Name: Kristi [name] MRN: [id number] Date of Birth: 06/20/1956 Referring Provider (PT): Lauraine Rinne, [place]   Encounter Date: 07/22/2020   PT End of Session - 07/22/20 1755    Visit Number 5    Number of Visits 7    Date for PT Re-Evaluation 08/05/20    Progress Note Due on Visit 7    PT Start Time 7412    PT Stop Time 1815    PT Time Calculation (min) 40 min    Equipment Utilized During Treatment Gait belt    Activity Tolerance Patient tolerated treatment well    Behavior During Therapy [facility] for tasks assessed/performed           Past Medical History:  Diagnosis Date  . Anemia    Iron deficinecy anemia  . Anemia 05/14/2017  . Anxiety   . Anxiety and depression 05/15/2014  . Arthritis   . Breast cancer ([place])    left ,last radiation 2'15, last chemo 8'14  . Constipation 11/13/2016  . Depression   . History of chicken pox   . History of radiation therapy 09/09/13-10/28/13   45 gray to left breast, lumpectomy cavity boosted to 63 gray  . HTN (hypertension) 11/13/2016  . Hyperglycemia 01/09/2016  . Hyperlipidemia 05/14/2017  . Hypertension   . MRSA (methicillin resistant Staphylococcus aureus) 2009   right groin area-no issues now. 04-07-14 PCR screen negative today.  . Neuropathy   . Preventative health care 11/13/2016  . Stroke [facility]) 06/2020    Past Surgical History:  Procedure Laterality Date  . ANAL SPHINCTEROTOMY  04/2011  . APPENDECTOMY  1980  . AXILLARY LYMPH NODE DISSECTION Left 02/04/2013   Procedure: LEFT AXILLARY LYMPH NODE DISSECTION;  Surgeon: Stark Klein, MD;  Location: [place];  Service: General;  Laterality: Left;  End: 8786  . BREAST LUMPECTOMY WITH NEEDLE LOCALIZATION Left 02/04/2013   Procedure: LEFT BREAST LUMPECTOMY WITH NEEDLE LOCALIZATION;  Surgeon: Stark Klein,  MD;  Location: [place];  Service: General;  Laterality: Left;  . BREAST SURGERY     Lumpectomy in april 2014  . HEMORRHOID SURGERY  04/2011   ligation  . MASTECTOMY Left 02/15/2017  . PORT-A-CATH REMOVAL N/A 04/16/2014   Procedure: REMOVAL PORT-A-CATH;  Surgeon: Stark Klein, MD;  [facility];  Service: General;  Laterality: N/A;  . PORTACATH PLACEMENT Right 02/04/2013   Procedure: INSERTION PORT-A-CATH;  Surgeon: Stark Klein, MD;  Location: [place];  Service: General;  Laterality: Right;  Start Time: 7672.  Marland Kitchen SHOULDER ARTHROSCOPY WITH ROTATOR CUFF REPAIR AND SUBACROMIAL DECOMPRESSION Left 02/24/2014   Procedure: SHOULDER ARTHROSCOPY WITH ROTATOR CUFF REPAIR AND SUBACROMIAL DECOMPRESSION;  Surgeon: Meredith Pel, MD;  Location: [place];  Service: Orthopedics;  Laterality: Left;  LEFT SHOULDER DIAGNOSTIC OPERATIVE ARTHROSCOPY, SUBACROMIAL DECOMPRESSION, ROTATOR CUFF TEAR REPAIR  . SIMPLE MASTECTOMY WITH AXILLARY SENTINEL NODE BIOPSY Left 02/15/2017   Procedure: LEFT MASTECTOMY;  Surgeon: Stark Klein, MD;  Location: [place];  Service: General;  Laterality: Left;  . TOTAL MASTECTOMY Right 12/26/2018   Procedure: RIGHT BREAST PROPHYLATIC MASTECTOMY;  Surgeon: Stark Klein, MD;  Location: [place];  Service: General;  Laterality: Right;    There were no vitals filed for this visit.   Subjective Assessment - 07/22/20 1743    Subjective I am walking up to 30 min now. I have  not tried driving yet.    Patient is accompained by: Family member   Daughter   Pertinent History anxiety, left breast cancer with radiation chemotherapy, hyperlipidemia, hypertension    Limitations Walking;House hold activities    How long can you sit comfortably? no issues    How long can you stand comfortably? 10 min    How long can you walk comfortably? 5-10 min    Diagnostic tests IMPRESSION:1. High-grade stenosis at the origin of the left vertebral arterywith occlusion less than 1 cm from the origin. The vessel  isreconstituted at the level of C6.2. The right vertebral artery is dominant.3. High-grade stenosis at the origin of the left external carotidartery.4. Minimal atherosclerotic changes at the right carotid bifurcationand cavernous internal carotid arteries bilaterally withoutsignificant stenosis.5. Minimal vessel wall irregularity in the mid cervical internalcarotid arteries bilaterally without significant stenosis. This mayrepresent fibromuscular dysplasia.6. Multilevel spondylosis of the cervical spine. IMPRESSION:1. Acute/subacute nonhemorrhagic infarct involving the inferomedialleft cerebellum.2. Additional acute/subacute nonhemorrhagic posterior corticalinfarct in the medial right cerebellum.3. No acute or subacute supratentorial infarct.    Patient Stated Goals take showers without shower chair, improve overall endurance, be able to drive             Practiced bending over and getting pots from bottom cabinet and putting on stove, from stove to bottom cabinet, from bottom cabinet to dining table, from dining table to stove, stove to dining table and table to dishwasher. Pt had mild LOB 2x during this activitiy-needed min A one time to regain balance due to dizziness. Pt was given cues to stand and wait for 5 seconds when coming up from bent over position  Sit to stand: 15lbs 2 x 10 Fwd step up on airex foam: 20x R and L Standing wide BOS on airex: 30 secc, EC Standing narrow BOS on airex: 30 sec, EC Tandem stance on airex foam with head and body turns: 10x R and L Picking up bean bags from floor, turning 180 deg and throwing it in the basket: 15 bags                            PT Short Term Goals - 06/24/20 1612      PT SHORT TERM GOAL #1   Title STG=LTG             PT Long Term Goals - 06/24/20 1612      PT LONG TERM GOAL #1   Title Patient will be able to ambulate for 30 min without AD and SBA to improve walking endurance    Baseline 10 min    Time 6      Period Weeks    Status New    Target Date 08/05/20      PT LONG TERM GOAL #2   Title Patient will demo 22/24 or better on dynamic gait index to improve balance with functional gait.    Baseline 20/24 (eval)    Time 6    Period Weeks    Status New    Target Date 08/05/20      PT LONG TERM GOAL #3   Title Patient will be able to stand/walk for 2 hours to be able to go shopping    Baseline 10 min    Time 6    Period Weeks    Status New    Target Date 08/05/20  Plan - 07/22/20 1754    Clinical Impression Statement Today's skilled session was focused on functional activities that required bending, pivoting, twisting and focusing on slowing down before turning or pivoting to prevent provoking light headeness.    Personal Factors and Comorbidities Past/Current Experience;Comorbidity 2    Comorbidities anxiety, left breast cancer with radiation chemotherapy, hyperlipidemia, hypertension    Examination-Activity Limitations Bathing;Caring for Others;Carry;Locomotion Level;Stairs;Stand    Examination-Participation Restrictions Church;Cleaning;Community Activity;Driving;Shop;Yard Work    [employer]chant [employer] officer Evolving/Moderate complexity    Rehab Potential Good    PT Frequency 1x / week    PT Duration 6 weeks    PT Treatment/Interventions ADLs/Self Care Home Management;Neuromuscular re-education;Balance training;Therapeutic exercise;Therapeutic activities;Functional mobility training;Stair training;Gait training;Patient/family education;Manual techniques;Passive range of motion;Energy conservation;Vestibular;Joint Manipulations    PT Next Visit Plan progress gaze stabilization to standing with narrow BOS next session.    PT Home Exercise Plan Walking program; [id number]    Consulted and Agree with Plan of Care Patient           Patient will benefit from skilled therapeutic intervention in order to improve the following deficits and impairments:   Abnormal gait, Decreased activity tolerance, Decreased balance, Decreased endurance, Difficulty walking, Dizziness, Impaired sensation, Postural dysfunction  Visit Diagnosis: Cerebral infarction involving left cerebellar artery (HCC)  Other abnormalities of gait and mobility  Unsteadiness on feet  Cerebrovascular accident (CVA) due to thrombosis of precerebral artery [facility])     Problem List Patient Active Problem List   Diagnosis Date Noted  . Cerebral infarction involving left cerebellar artery ([place]) 06/19/2020  . Cerebrovascular accident (CVA) ([place])   . Vertigo   . Generalized anxiety disorder   . Acute blood loss anemia   . Benign essential HTN   . Dyslipidemia   . Acute CVA (cerebrovascular accident) ([place]) 06/15/2020  . Debility 01/18/2020  . Light headed 01/18/2020  . Mild cognitive impairment 08/24/2019  . Genetic testing 02/20/2019  . S/P mastectomy, right 12/26/2018  . Left upper extremity numbness 06/19/2018  . Cervical cancer screening 06/18/2018  . Anemia 05/14/2017  . Hyperlipidemia 05/14/2017  . History of left breast cancer 02/15/2017  . Nodule of finger of left hand 11/14/2016  . Right hip pain 11/14/2016  . Knee pain, bilateral 11/14/2016  . Constipation 11/13/2016  . Preventative health care 11/13/2016  . HTN (hypertension) 11/13/2016  . Hyperglycemia 01/09/2016  . Neuropathy of hand, left 09/21/2015  . History of chicken pox   . Shingles 05/15/2014  . Anxiety and depression 05/15/2014  . Rotator cuff tear 02/24/2014  . Left shoulder pain with abduction 12/12/2013  . CVA (cerebral infarction) 07/18/2013  . TIA (transient ischemic attack) 07/18/2013  . Contracture of axilla 05/20/2013  . Primary cancer of upper inner quadrant of left female breast ([place]) 01/24/2013  . Anal fissure 05/03/2011  . Hemorrhoids, internal, with prolapse 05/03/2011    Kerrie Pleasure, PT 07/22/2020, 6:01 PM  [facility] [address] [place], [name], [address] Phone: [phone]   Fax:  [phone]  Name: Kristi [name] MRN: [id number] Date of Birth: 1956/07/13

## 2020-07-29 ENCOUNTER — Ambulatory Visit: Payer: Medicare Other

## 2020-07-29 ENCOUNTER — Other Ambulatory Visit: Payer: Self-pay

## 2020-07-29 DIAGNOSIS — I63542 Cerebral infarction due to unspecified occlusion or stenosis of left cerebellar artery: Secondary | ICD-10-CM | POA: Diagnosis not present

## 2020-07-29 DIAGNOSIS — R2681 Unsteadiness on feet: Secondary | ICD-10-CM | POA: Diagnosis not present

## 2020-07-29 DIAGNOSIS — R2689 Other abnormalities of gait and mobility: Secondary | ICD-10-CM

## 2020-07-29 DIAGNOSIS — I6300 Cerebral infarction due to thrombosis of unspecified precerebral artery: Secondary | ICD-10-CM | POA: Diagnosis not present

## 2020-07-29 NOTE — Therapy (Signed)
[facility] [address] [place], [name], [address] Phone: [phone]   Fax:  [phone]  Physical Therapy Treatment  Patient Details  Name: Kristi [name] MRN: [id number] Date of Birth: 01/02/1956 Referring Provider (PT): Lauraine Rinne, [place]   Encounter Date: 07/29/2020   PT End of Session - 07/29/20 1806    Visit Number 6    Number of Visits 7    Date for PT Re-Evaluation 08/05/20    Progress Note Due on Visit 7    PT Start Time 3151    PT Stop Time 1825    PT Time Calculation (min) 40 min    Equipment Utilized During Treatment Gait belt    Activity Tolerance Patient tolerated treatment well    Behavior During Therapy [facility] for tasks assessed/performed           Past Medical History:  Diagnosis Date  . Anemia    Iron deficinecy anemia  . Anemia 05/14/2017  . Anxiety   . Anxiety and depression 05/15/2014  . Arthritis   . Breast cancer ([place])    left ,last radiation 2'15, last chemo 8'14  . Constipation 11/13/2016  . Depression   . History of chicken pox   . History of radiation therapy 09/09/13-10/28/13   45 gray to left breast, lumpectomy cavity boosted to 63 gray  . HTN (hypertension) 11/13/2016  . Hyperglycemia 01/09/2016  . Hyperlipidemia 05/14/2017  . Hypertension   . MRSA (methicillin resistant Staphylococcus aureus) 2009   right groin area-no issues now. 04-07-14 PCR screen negative today.  . Neuropathy   . Preventative health care 11/13/2016  . Stroke [facility]) 06/2020    Past Surgical History:  Procedure Laterality Date  . ANAL SPHINCTEROTOMY  04/2011  . APPENDECTOMY  1980  . AXILLARY LYMPH NODE DISSECTION Left 02/04/2013   Procedure: LEFT AXILLARY LYMPH NODE DISSECTION;  Surgeon: Stark Klein, MD;  Location: [place];  Service: General;  Laterality: Left;  End: 7616  . BREAST LUMPECTOMY WITH NEEDLE LOCALIZATION Left 02/04/2013   Procedure: LEFT BREAST LUMPECTOMY WITH NEEDLE LOCALIZATION;  Surgeon: Stark Klein,  MD;  Location: [place];  Service: General;  Laterality: Left;  . BREAST SURGERY     Lumpectomy in april 2014  . HEMORRHOID SURGERY  04/2011   ligation  . MASTECTOMY Left 02/15/2017  . PORT-A-CATH REMOVAL N/A 04/16/2014   Procedure: REMOVAL PORT-A-CATH;  Surgeon: Stark Klein, MD;  [facility];  Service: General;  Laterality: N/A;  . PORTACATH PLACEMENT Right 02/04/2013   Procedure: INSERTION PORT-A-CATH;  Surgeon: Stark Klein, MD;  Location: [place];  Service: General;  Laterality: Right;  Start Time: 0737.  Marland Kitchen SHOULDER ARTHROSCOPY WITH ROTATOR CUFF REPAIR AND SUBACROMIAL DECOMPRESSION Left 02/24/2014   Procedure: SHOULDER ARTHROSCOPY WITH ROTATOR CUFF REPAIR AND SUBACROMIAL DECOMPRESSION;  Surgeon: Meredith Pel, MD;  Location: [place];  Service: Orthopedics;  Laterality: Left;  LEFT SHOULDER DIAGNOSTIC OPERATIVE ARTHROSCOPY, SUBACROMIAL DECOMPRESSION, ROTATOR CUFF TEAR REPAIR  . SIMPLE MASTECTOMY WITH AXILLARY SENTINEL NODE BIOPSY Left 02/15/2017   Procedure: LEFT MASTECTOMY;  Surgeon: Stark Klein, MD;  Location: [place];  Service: General;  Laterality: Left;  . TOTAL MASTECTOMY Right 12/26/2018   Procedure: RIGHT BREAST PROPHYLATIC MASTECTOMY;  Surgeon: Stark Klein, MD;  Location: [place];  Service: General;  Laterality: Right;    There were no vitals filed for this visit.   Subjective Assessment - 07/29/20 1750    Subjective I am feeling 90% back to normal. Haven't had dizziness.  I have been driving.    Patient is accompained by: Family member   Daughter   Pertinent History anxiety, left breast cancer with radiation chemotherapy, hyperlipidemia, hypertension    Limitations Walking;House hold activities    How long can you sit comfortably? no issues    How long can you stand comfortably? 10 min    How long can you walk comfortably? 5-10 min    Diagnostic tests IMPRESSION:1. High-grade stenosis at the origin of the left vertebral arterywith occlusion less than 1 cm from the origin. The  vessel isreconstituted at the level of C6.2. The right vertebral artery is dominant.3. High-grade stenosis at the origin of the left external carotidartery.4. Minimal atherosclerotic changes at the right carotid bifurcationand cavernous internal carotid arteries bilaterally withoutsignificant stenosis.5. Minimal vessel wall irregularity in the mid cervical internalcarotid arteries bilaterally without significant stenosis. This mayrepresent fibromuscular dysplasia.6. Multilevel spondylosis of the cervical spine. IMPRESSION:1. Acute/subacute nonhemorrhagic infarct involving the inferomedialleft cerebellum.2. Additional acute/subacute nonhemorrhagic posterior corticalinfarct in the medial right cerebellum.3. No acute or subacute supratentorial infarct.    Patient Stated Goals take showers without shower chair, improve overall endurance, be able to drive              [facility] PT Assessment - 07/29/20 1752      Dynamic Gait Index   Level Surface Normal    Change in Gait Speed Normal    Gait with Horizontal Head Turns Mild Impairment    Gait with Vertical Head Turns Normal    Gait and Pivot Turn Normal    Step Over Obstacle Normal    Step Around Obstacles Normal    Steps Normal    Total Score 23    DGI comment: 23/24 compared to 20/24 on eval      Functional Gait  Assessment   Gait Level Surface Walks 20 ft in less than 5.5 sec, no assistive devices, good speed, no evidence for imbalance, normal gait pattern, deviates no more than 6 in outside of the 12 in walkway width.    Change in Gait Speed Able to smoothly change walking speed without loss of balance or gait deviation. Deviate no more than 6 in outside of the 12 in walkway width.    Gait with Horizontal Head Turns Performs head turns smoothly with slight change in gait velocity (eg, minor disruption to smooth gait path), deviates 6-10 in outside 12 in walkway width, or uses an assistive device.    Gait with Vertical Head Turns Performs head  turns with no change in gait. Deviates no more than 6 in outside 12 in walkway width.    Gait and Pivot Turn Pivot turns safely within 3 sec and stops quickly with no loss of balance.    Step Over Obstacle Is able to step over 2 stacked shoe boxes taped together (9 in total height) without changing gait speed. No evidence of imbalance.    Gait with Narrow Base of Support Is able to ambulate for 10 steps heel to toe with no staggering.    Gait with Eyes Closed Walks 20 ft, no assistive devices, good speed, no evidence of imbalance, normal gait pattern, deviates no more than 6 in outside 12 in walkway width. Ambulates 20 ft in less than 7 sec.    Ambulating Backwards Walks 20 ft, no assistive devices, good speed, no evidence for imbalance, normal gait    Steps Alternating feet, no rail.    Total Score 29  DGI and FGA performed Walking with horizontal head turns: 6 x 20 feet with one hand on wall, 2 x 40 feet no hand assist Standing on foam: EC with horizontal head turns: 3 x 10 Standing on floor: EC with horizontal head turns: 3 x 10                   PT Short Term Goals - 06/24/20 1612      PT SHORT TERM GOAL #1   Title STG=LTG             PT Long Term Goals - 07/29/20 1751      PT LONG TERM GOAL #1   Title Patient will be able to ambulate for 30 min without AD and SBA to improve walking endurance    Baseline 10 min, 25-30 min 07/29/20    Time 6    Period Weeks    Status Achieved      PT LONG TERM GOAL #2   Title Patient will demo 22/24 or better on dynamic gait index to improve balance with functional gait.    Baseline 20/24 (eval); 23/24 07/29/20    Time 6    Period Weeks    Status Achieved    Target Date 08/05/20      PT LONG TERM GOAL #3   Title Patient will be able to stand/walk for 2 hours to be able to go shopping    Baseline 10 min; 1 hours 07/29/20 (pt doesn't shop that long as she doesn't have to shop for that long)    Time [place] - 07/29/20 1831    Clinical Impression Statement Patient demonstrated significant improvemen in DGI from 20/24 to 23/24 and FGA from 16/30 to 29/30. Patient is still challanged by walking with horizontal head turns. Patient was asked to practice walking with head turns as HEP and it will be reevaluated again next week    Personal Factors and Comorbidities Past/Current Experience;Comorbidity 2    Comorbidities anxiety, left breast cancer with radiation chemotherapy, hyperlipidemia, hypertension    Examination-Activity Limitations Bathing;Caring for Others;Carry;Locomotion Level;Stairs;Stand    Examination-Participation Restrictions Church;Cleaning;Community Activity;Driving;Shop;Yard Work    [employer]chant [employer] officer Evolving/Moderate complexity    Rehab Potential Good    PT Frequency 1x / week    PT Duration 6 weeks    PT Treatment/Interventions ADLs/Self Care Home Management;Neuromuscular re-education;Balance training;Therapeutic exercise;Therapeutic activities;Functional mobility training;Stair training;Gait training;Patient/family education;Manual techniques;Passive range of motion;Energy conservation;Vestibular;Joint Manipulations    PT Next Visit Plan Reassess walking with head turns next session, plan D/C    PT Home Exercise Plan Walking program; [id number], [id number]    Consulted and Agree with Plan of Care Patient           Patient will benefit from skilled therapeutic intervention in order to improve the following deficits and impairments:  Abnormal gait, Decreased activity tolerance, Decreased balance, Decreased endurance, Difficulty walking, Dizziness, Impaired sensation, Postural dysfunction  Visit Diagnosis: Cerebral infarction involving left cerebellar artery (HCC)  Other abnormalities of gait and mobility  Unsteadiness on feet  Cerebrovascular accident (CVA) due to thrombosis of precerebral artery  [facility])     Problem List Patient Active Problem List   Diagnosis Date Noted  . Cerebral infarction involving left cerebellar artery ([place]) 06/19/2020  . Cerebrovascular accident (CVA) ([place])   . Vertigo   .  Generalized anxiety disorder   . Acute blood loss anemia   . Benign essential HTN   . Dyslipidemia   . Acute CVA (cerebrovascular accident) ([place]) 06/15/2020  . Debility 01/18/2020  . Light headed 01/18/2020  . Mild cognitive impairment 08/24/2019  . Genetic testing 02/20/2019  . S/P mastectomy, right 12/26/2018  . Left upper extremity numbness 06/19/2018  . Cervical cancer screening 06/18/2018  . Anemia 05/14/2017  . Hyperlipidemia 05/14/2017  . History of left breast cancer 02/15/2017  . Nodule of finger of left hand 11/14/2016  . Right hip pain 11/14/2016  . Knee pain, bilateral 11/14/2016  . Constipation 11/13/2016  . Preventative health care 11/13/2016  . HTN (hypertension) 11/13/2016  . Hyperglycemia 01/09/2016  . Neuropathy of hand, left 09/21/2015  . History of chicken pox   . Shingles 05/15/2014  . Anxiety and depression 05/15/2014  . Rotator cuff tear 02/24/2014  . Left shoulder pain with abduction 12/12/2013  . CVA (cerebral infarction) 07/18/2013  . TIA (transient ischemic attack) 07/18/2013  . Contracture of axilla 05/20/2013  . Primary cancer of upper inner quadrant of left female breast ([place]) 01/24/2013  . Anal fissure 05/03/2011  . Hemorrhoids, internal, with prolapse 05/03/2011    Kerrie Pleasure 07/29/2020, 6:34 PM  [facility] [address] [place], [name], [address] Phone: [phone]   Fax:  [phone]  Name: Kristi [name] MRN: [id number] Date of Birth: Apr 01, 1956

## 2020-07-30 ENCOUNTER — Ambulatory Visit: Payer: Medicare Other | Admitting: Cardiology

## 2020-07-30 ENCOUNTER — Encounter: Payer: Self-pay | Admitting: Cardiology

## 2020-07-30 VITALS — BP 102/58 | HR 72 | Ht 62.0 in | Wt 156.0 lb

## 2020-07-30 DIAGNOSIS — I10 Essential (primary) hypertension: Secondary | ICD-10-CM | POA: Diagnosis not present

## 2020-07-30 DIAGNOSIS — E782 Mixed hyperlipidemia: Secondary | ICD-10-CM | POA: Diagnosis not present

## 2020-07-30 DIAGNOSIS — R002 Palpitations: Secondary | ICD-10-CM

## 2020-07-30 DIAGNOSIS — Z8673 Personal history of transient ischemic attack (TIA), and cerebral infarction without residual deficits: Secondary | ICD-10-CM

## 2020-07-30 HISTORY — DX: Palpitations: R00.2

## 2020-07-30 NOTE — Patient Instructions (Addendum)
Medication Instructions:  No medication changes. *If you need a refill on your cardiac medications before your next appointment, please call your pharmacy*   Lab Work: None ordered If you have labs (blood work) drawn today and your tests are completely normal, you will receive your results only by: Marland Kitchen MyChart Message (if you have MyChart) OR . A paper copy in the mail If you have any lab test that is abnormal or we need to change your treatment, we will call you to review the results.   Testing/Procedures: Your physician has recommended that you wear an event monitor. Event monitors are medical devices that record the heart's electrical activity. Doctors most often [place] these monitors to diagnose arrhythmias. Arrhythmias are problems with the speed or rhythm of the heartbeat. The monitor is a small, portable device. You can wear one while you do your normal daily activities. This is usually used to diagnose what is causing palpitations/syncope (passing out).  Wear the monitor for 30 days.     Follow-Up: At [facility], you and your health needs are our priority.  As part of our continuing mission to provide you with exceptional heart care, we have created designated Provider Care Teams.  These Care Teams include your primary Cardiologist (physician) and Advanced Practice Providers (APPs -  Physician Assistants and Nurse Practitioners) who all work together to provide you with the care you need, when you need it.  We recommend signing up for the patient portal called "MyChart".  Sign up information is provided on this After Visit Summary.  MyChart is used to connect with patients for Virtual Visits (Telemedicine).  Patients are able to view lab/test results, encounter notes, upcoming appointments, etc.  Non-urgent messages can be sent to your provider as well.   To learn more about what you can do with MyChart, go to [email or web address].    Your next appointment:   1 month(s)  The  format for your next appointment:   In Person  Provider:   Jyl Heinz, MD   Other Instructions NA

## 2020-07-30 NOTE — Progress Notes (Signed)
Cardiology Office Note:    Date:  07/30/2020   ID:  Kristi [name], DOB 1956-04-02, MRN [id number]  PCP:  Mosie Lukes, MD  Cardiologist:  Jenean Lindau, MD   Referring MD: Mackie Pai, PA-C    ASSESSMENT:    1. Essential hypertension   2. Mixed dyslipidemia   3. History of stroke   4. Palpitations    PLAN:    In order of problems listed above:  1. Primary prevention stressed with the patient.  Importance of compliance with diet medication stressed she vocalized understanding 2. Essential hypertension: Blood pressure stable and diet was emphasized 3. Mixed dyslipidemia: Patient on statin therapy and lipids are fine.  I reviewed them with her.  Diet was emphasized importance of regular activity stressed. 4. History of recent stroke.  Echocardiogram was unremarkable.  I will do a 1 month monitoring to assess for any arrhythmias.  She occasionally has palpitations. 5. Patient will be seen in follow-up appointment in 3 months or earlier if the patient has any concerns    Medication Adjustments/Labs and Tests Ordered: Current medicines are reviewed at length with the patient today.  Concerns regarding medicines are outlined above.  No orders of the defined types were placed in this encounter.  No orders of the defined types were placed in this encounter.    History of Present Illness:    Kristi [name] is a 64 y.o. female who is being seen today for the evaluation of cardiac etiology of her stroke at the request of [place], Percell Miller, [place].  Patient is a pleasant 64 year old female.  She has past medical history of essential hypertension dyslipidemia.  She recently had a stroke.  She was admitted to the hospital.  She underwent complete evaluation for this.  She mentions to me that she is back with physical therapy and is 90% better now.  No chest pain orthopnea or PND.  At the time of my evaluation, the patient is alert awake oriented and in no distress.  Past Medical  History:  Diagnosis Date  . Anemia    Iron deficinecy anemia  . Anemia 05/14/2017  . Anxiety   . Anxiety and depression 05/15/2014  . Arthritis   . Breast cancer ([place])    left ,last radiation 2'15, last chemo 8'14  . Constipation 11/13/2016  . Depression   . History of chicken pox   . History of radiation therapy 09/09/13-10/28/13   45 gray to left breast, lumpectomy cavity boosted to 63 gray  . HTN (hypertension) 11/13/2016  . Hyperglycemia 01/09/2016  . Hyperlipidemia 05/14/2017  . Hypertension   . MRSA (methicillin resistant Staphylococcus aureus) 2009   right groin area-no issues now. 04-07-14 PCR screen negative today.  . Neuropathy   . Preventative health care 11/13/2016  . Stroke [facility]) 06/2020    Past Surgical History:  Procedure Laterality Date  . ANAL SPHINCTEROTOMY  04/2011  . APPENDECTOMY  1980  . AXILLARY LYMPH NODE DISSECTION Left 02/04/2013   Procedure: LEFT AXILLARY LYMPH NODE DISSECTION;  Surgeon: Stark Klein, MD;  Location: [place];  Service: General;  Laterality: Left;  End: 9983  . BREAST LUMPECTOMY WITH NEEDLE LOCALIZATION Left 02/04/2013   Procedure: LEFT BREAST LUMPECTOMY WITH NEEDLE LOCALIZATION;  Surgeon: Stark Klein, MD;  Location: [place];  Service: General;  Laterality: Left;  . BREAST SURGERY     Lumpectomy in april 2014  . HEMORRHOID SURGERY  04/2011   ligation  . MASTECTOMY Left 02/15/2017  . PORT-A-CATH  REMOVAL N/A 04/16/2014   Procedure: REMOVAL PORT-A-CATH;  Surgeon: Stark Klein, MD;  [facility];  Service: General;  Laterality: N/A;  . PORTACATH PLACEMENT Right 02/04/2013   Procedure: INSERTION PORT-A-CATH;  Surgeon: Stark Klein, MD;  Location: [place];  Service: General;  Laterality: Right;  Start Time: 1610.  Marland Kitchen SHOULDER ARTHROSCOPY WITH ROTATOR CUFF REPAIR AND SUBACROMIAL DECOMPRESSION Left 02/24/2014   Procedure: SHOULDER ARTHROSCOPY WITH ROTATOR CUFF REPAIR AND SUBACROMIAL DECOMPRESSION;  Surgeon: Meredith Pel, MD;  Location: [place];  Service:  Orthopedics;  Laterality: Left;  LEFT SHOULDER DIAGNOSTIC OPERATIVE ARTHROSCOPY, SUBACROMIAL DECOMPRESSION, ROTATOR CUFF TEAR REPAIR  . SIMPLE MASTECTOMY WITH AXILLARY SENTINEL NODE BIOPSY Left 02/15/2017   Procedure: LEFT MASTECTOMY;  Surgeon: Stark Klein, MD;  Location: [place];  Service: General;  Laterality: Left;  . TOTAL MASTECTOMY Right 12/26/2018   Procedure: RIGHT BREAST PROPHYLATIC MASTECTOMY;  Surgeon: Stark Klein, MD;  Location: [place];  Service: General;  Laterality: Right;    Current Medications: Current Meds  Medication Sig  . anastrozole (ARIMIDEX) 1 MG tablet Take 1 tablet (1 mg total) by mouth daily. TAKE 1 TABLET (1 MG) BY MOUTH DAILY IN THE MORNING  . Apoaequorin (PREVAGEN) 10 MG CAPS Take 1 capsule by mouth daily.  Marland Kitchen aspirin EC 325 MG EC tablet Take 1 tablet (325 mg total) by mouth daily.  Marland Kitchen atorvastatin (LIPITOR) 40 MG tablet Take 1 tablet (40 mg total) by mouth daily.  . clopidogrel (PLAVIX) 75 MG tablet Take 1 tablet (75 mg total) by mouth daily.  Marland Kitchen gabapentin (NEURONTIN) 600 MG tablet Take 600 mg by mouth 2 (two) times daily.  Marland Kitchen gabapentin (NEURONTIN) 800 MG tablet Take 800 mg by mouth at bedtime.  Marland Kitchen HYDROcodone-acetaminophen (NORCO) 10-325 MG tablet Take 1 tablet by mouth 3 (three) times daily as needed.  . hydrOXYzine (ATARAX/VISTARIL) 50 MG tablet Take 1 tablet (50 mg total) by mouth 2 (two) times daily.  Marland Kitchen LATUDA 80 MG TABS tablet Take 0.5 tablets (40 mg total) by mouth at bedtime.  . Magnesium 250 MG TABS Take 1 tablet (250 mg total) by mouth daily.  . Multiple Vitamin (MULTIVITAMIN WITH MINERALS) TABS tablet Take 1 tablet by mouth daily.  . polyethylene glycol (MIRALAX / GLYCOLAX) 17 g packet Take 17 g by mouth daily as needed for mild constipation.  . traZODone (DESYREL) 50 MG tablet Take 1 tablet (50 mg total) by mouth at bedtime.  . valsartan-hydrochlorothiazide (DIOVAN HCT) 160-12.5 MG tablet Take 1 tablet by mouth daily. (Patient taking differently: Take 1  tablet by mouth every other day. Alternates with the 320 mg of Valsartan-hydrochlorothiazide)  . valsartan-hydrochlorothiazide (DIOVAN-HCT) 320-25 MG tablet TAKE 1 TABLET BY MOUTH EVERY DAY IN THE MORNING     Allergies:   Patient has no known allergies.   Social History   Socioeconomic History  . Marital status: Married    Spouse name: [name]  . Number of children: 1  . Years of education: 43  . Highest education level: Not on file  Occupational History  . 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 Smoking status: Never Smoker  . Smokeless tobacco: Never Used  Vaping Use  . Vaping Use: Never used  Substance and Sexual Activity  . Alcohol use: No  . Drug use: No  . Sexual activity: Yes    Comment: lives with husband, disability/retirement. [employer], no dietary restrictions  Other Topics Concern  . Not  on file  Social History Narrative   Patient is married [name]) and lives at home with her husband.   Patient has one daughter.   Patient is working at [employer]   Patient has a 12th grade education.   Patient drinks very little caffeine.   Social Determinants of Health   Financial Resource Strain: Low Risk   . Difficulty of Paying Living Expenses: Not hard at all  Food Insecurity: No Food Insecurity  . Worried About Charity fundraiser in the Last Year: Never true  . Ran Out of Food in the Last Year: Never true  Transportation Needs: No Transportation Needs  . Lack of Transportation (Medical): No  . Lack of Transportation (Non-Medical): No  Physical Activity:   . Days of Exercise per Week: Not on file  . Minutes of Exercise per Session: Not on file  Stress:   . Feeling of Stress : Not on file  Social Connections:   . Frequency of Communication with Friends and Family: Not on file  . Frequency of Social Gatherings with Friends and Family: Not on file  . Attends Religious Services: Not on file  . Active Member of Clubs or  Organizations: Not on file  . Attends Archivist Meetings: Not on file  . Marital Status: Not on file     Family History: The patient's family history includes Alcohol abuse in her brother; Arthritis in her daughter; Breast cancer in her cousin and cousin; Breast cancer (age of onset: 35) in her paternal aunt; Cancer in her cousin, father, paternal uncle, and sister; Cervical cancer in her paternal aunt; Colon cancer in her paternal aunt; Dementia in her mother; Diabetes in her mother; Hypertension in her brother, father, and mother; Kidney cancer in her paternal grandmother; Lung cancer in her father; Memory loss in her mother; Ovarian cancer in her cousin; Seizures in her brother; Thyroid cancer in her father.  ROS:   Please see the history of present illness.    All other systems reviewed and are negative.  EKGs/Labs/Other Studies Reviewed:    The following studies were reviewed today: EKG reveals sinus rhythm nonspecific ST-T changes   Recent Labs: 01/21/2020: TSH 1.32 06/28/2020: ALT 13; BUN 13; Creat 1.27; Hemoglobin 11.0; Platelets 208; Potassium 4.7; Sodium 140  Recent Lipid Panel    Component Value Date/Time   CHOL 144 06/16/2020 0801   TRIG 68 06/16/2020 0801   HDL 41 06/16/2020 0801   CHOLHDL 3.5 06/16/2020 0801   VLDL 14 06/16/2020 0801   LDLCALC 89 06/16/2020 0801    Physical Exam:    VS:  BP (!) 102/58   Pulse 72   Ht 5\' 2"  (1.575 m)   Wt 156 lb (70.8 kg)   LMP 01/20/2013   SpO2 99%   BMI 28.53 kg/m     Wt Readings from Last 3 Encounters:  07/30/20 156 lb (70.8 kg)  06/30/20 157 lb 9.6 oz (71.5 kg)  06/28/20 156 lb 12.8 oz (71.1 kg)     GEN: Patient is in no acute distress HEENT: Normal NECK: No JVD; No carotid bruits LYMPHATICS: No lymphadenopathy CARDIAC: S1 S2 regular, 2/6 systolic murmur at the apex. RESPIRATORY:  Clear to auscultation without rales, wheezing or rhonchi  ABDOMEN: Soft, non-tender, non-distended MUSCULOSKELETAL:  No  edema; No deformity  SKIN: Warm and dry NEUROLOGIC:  Alert and oriented x 3 PSYCHIATRIC:  Normal affect    Signed, Jenean Lindau, MD  07/30/2020 3:02 PM    [facility]

## 2020-08-02 ENCOUNTER — Encounter: Payer: Medicare Other | Admitting: Physical Medicine and Rehabilitation

## 2020-08-03 ENCOUNTER — Other Ambulatory Visit: Payer: Self-pay

## 2020-08-03 ENCOUNTER — Encounter: Payer: Medicare Other | Attending: Physical Medicine and Rehabilitation | Admitting: Registered Nurse

## 2020-08-03 ENCOUNTER — Encounter: Payer: Self-pay | Admitting: Registered Nurse

## 2020-08-03 VITALS — BP 109/68 | HR 64 | Temp 98.8°F | Ht 62.0 in | Wt 148.0 lb

## 2020-08-03 DIAGNOSIS — Z5181 Encounter for therapeutic drug level monitoring: Secondary | ICD-10-CM | POA: Diagnosis not present

## 2020-08-03 DIAGNOSIS — G894 Chronic pain syndrome: Secondary | ICD-10-CM | POA: Insufficient documentation

## 2020-08-03 DIAGNOSIS — Z79891 Long term (current) use of opiate analgesic: Secondary | ICD-10-CM | POA: Insufficient documentation

## 2020-08-03 DIAGNOSIS — G620 Drug-induced polyneuropathy: Secondary | ICD-10-CM | POA: Diagnosis not present

## 2020-08-03 DIAGNOSIS — T451X5A Adverse effect of antineoplastic and immunosuppressive drugs, initial encounter: Secondary | ICD-10-CM | POA: Insufficient documentation

## 2020-08-03 MED ORDER — HYDROCODONE-ACETAMINOPHEN 10-325 MG PO TABS
1.0000 | ORAL_TABLET | Freq: Three times a day (TID) | ORAL | 0 refills | Status: DC | PRN
Start: 2020-08-03 — End: 2020-08-30

## 2020-08-03 NOTE — Progress Notes (Signed)
Subjective:    Patient ID: Kristi [name], female    DOB: 05-Apr-1956, 64 y.o.   MRN: [id number]  HPI: Kristi [name] is a 64 y.o. female who returns for follow up appointment for chronic pain and medication refill. She states her pain is located in her bilateral finger tips with numbness, left hand numbness and bilateral feet with tingling and burning. She rates her pain 3. Her current exercise regime is walking.   [name] Morphine equivalent is 30.00 MME.  UDS ordered for Today.   Pain Inventory Average Pain 7 Pain Right Now 3 My pain is constant, burning, tingling, aching and numbness  In the last 24 hours, has pain interfered with the following? General activity 2 Relation with others 0 Enjoyment of life 3 What TIME of day is your pain at its worst? night Sleep (in general) Fair  Pain is worse with: walking, bending, sitting, standing and some activites Pain improves with: medication Relief from Meds: 6  Family History  Problem Relation Age of Onset  . Lung cancer Father   . Hypertension Father   . Thyroid cancer Father        dx in his 51s  . Cancer Father        lung, thyroid, smoker  . Breast cancer Paternal Aunt 15  . Colon cancer Paternal Aunt        dx in her 50x  . Cervical cancer Paternal Aunt        dzx in her 58s  . Ovarian cancer Cousin        dx in her lage 8s  . Breast cancer Cousin        maternal first cousin, once removed; dx in her late 51s  . Breast cancer Cousin        maternal first cousin once removed; dx in late 19s  . Hypertension Mother   . Diabetes Mother   . Dementia Mother   . Memory loss Mother   . Hypertension Brother   . Seizures Brother        Alcohol induced.  . Alcohol abuse Brother        drinker, smoker  . Cancer Paternal Uncle        oral cancer  . Kidney cancer Paternal Grandmother   . Arthritis Daughter        back surgery  . Cancer Cousin        several paternal cousins with brain cancer, leukemia, and other  cancers  . Cancer Sister        stomach   Social History   Socioeconomic History  . Marital status: Married    Spouse name: [name]  . Number of children: 1  . Years of education: 23  . Highest education level: Not on file  Occupational History  . 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 Smoking status: Never Smoker  . Smokeless tobacco: Never Used  Vaping Use  . Vaping Use: Never used  Substance and Sexual Activity  . Alcohol use: No  . Drug use: No  . Sexual activity: Yes    Comment: lives with husband, disability/retirement. [employer], no dietary restrictions  Other Topics Concern  . Not on file  Social History Narrative   Patient is married [name]) and lives at home with her husband.   Patient has one daughter.   Patient is working at [employer]   Patient has a  12th grade education.   Patient drinks very little caffeine.   Social Determinants of Health   Financial Resource Strain: Low Risk   . Difficulty of Paying Living Expenses: Not hard at all  Food Insecurity: No Food Insecurity  . Worried About Charity fundraiser in the Last Year: Never true  . Ran Out of Food in the Last Year: Never true  Transportation Needs: No Transportation Needs  . Lack of Transportation (Medical): No  . Lack of Transportation (Non-Medical): No  Physical Activity:   . Days of Exercise per Week: Not on file  . Minutes of Exercise per Session: Not on file  Stress:   . Feeling of Stress : Not on file  Social Connections:   . Frequency of Communication with Friends and Family: Not on file  . Frequency of Social Gatherings with Friends and Family: Not on file  . Attends Religious Services: Not on file  . Active Member of Clubs or Organizations: Not on file  . Attends Archivist Meetings: Not on file  . Marital Status: Not on file   Past Surgical History:  Procedure Laterality Date  . ANAL SPHINCTEROTOMY  04/2011  .  APPENDECTOMY  1980  . AXILLARY LYMPH NODE DISSECTION Left 02/04/2013   Procedure: LEFT AXILLARY LYMPH NODE DISSECTION;  Surgeon: Stark Klein, MD;  Location: [place];  Service: General;  Laterality: Left;  End: 3500  . BREAST LUMPECTOMY WITH NEEDLE LOCALIZATION Left 02/04/2013   Procedure: LEFT BREAST LUMPECTOMY WITH NEEDLE LOCALIZATION;  Surgeon: Stark Klein, MD;  Location: [employer];  Service: General;  Laterality: Left;  . BREAST SURGERY     Lumpectomy in april 2014  . HEMORRHOID SURGERY  04/2011   ligation  . MASTECTOMY Left 02/15/2017  . PORT-A-CATH REMOVAL N/A 04/16/2014   Procedure: REMOVAL PORT-A-CATH;  Surgeon: Stark Klein, MD;  [facility];  Service: General;  Laterality: N/A;  . PORTACATH PLACEMENT Right 02/04/2013   Procedure: INSERTION PORT-A-CATH;  Surgeon: Stark Klein, MD;  Location: [place];  Service: General;  Laterality: Right;  Start Time: 9381.  Marland Kitchen SHOULDER ARTHROSCOPY WITH ROTATOR CUFF REPAIR AND SUBACROMIAL DECOMPRESSION Left 02/24/2014   Procedure: SHOULDER ARTHROSCOPY WITH ROTATOR CUFF REPAIR AND SUBACROMIAL DECOMPRESSION;  Surgeon: Meredith Pel, MD;  Location: [place];  Service: Orthopedics;  Laterality: Left;  LEFT SHOULDER DIAGNOSTIC OPERATIVE ARTHROSCOPY, SUBACROMIAL DECOMPRESSION, ROTATOR CUFF TEAR REPAIR  . SIMPLE MASTECTOMY WITH AXILLARY SENTINEL NODE BIOPSY Left 02/15/2017   Procedure: LEFT MASTECTOMY;  Surgeon: Stark Klein, MD;  Location: [place];  Service: General;  Laterality: Left;  . TOTAL MASTECTOMY Right 12/26/2018   Procedure: RIGHT BREAST PROPHYLATIC MASTECTOMY;  Surgeon: Stark Klein, MD;  Location: [place];  Service: General;  Laterality: Right;   Past Surgical History:  Procedure Laterality Date  . ANAL SPHINCTEROTOMY  04/2011  . APPENDECTOMY  1980  . AXILLARY LYMPH NODE DISSECTION Left 02/04/2013   Procedure: LEFT AXILLARY LYMPH NODE DISSECTION;  Surgeon: Stark Klein, MD;  Location: [place];  Service: General;  Laterality: Left;  End: 8299  . BREAST  LUMPECTOMY WITH NEEDLE LOCALIZATION Left 02/04/2013   Procedure: LEFT BREAST LUMPECTOMY WITH NEEDLE LOCALIZATION;  Surgeon: Stark Klein, MD;  Location: [place];  Service: General;  Laterality: Left;  . BREAST SURGERY     Lumpectomy in april 2014  . HEMORRHOID SURGERY  04/2011   ligation  . MASTECTOMY Left 02/15/2017  . PORT-A-CATH REMOVAL N/A 04/16/2014   Procedure: REMOVAL PORT-A-CATH;  Surgeon: Stark Klein, MD;  [facility];  Service: General;  Laterality: N/A;  . PORTACATH PLACEMENT Right 02/04/2013   Procedure: INSERTION PORT-A-CATH;  Surgeon: Stark Klein, MD;  Location: [place];  Service: General;  Laterality: Right;  Start Time: 1572.  Marland Kitchen SHOULDER ARTHROSCOPY WITH ROTATOR CUFF REPAIR AND SUBACROMIAL DECOMPRESSION Left 02/24/2014   Procedure: SHOULDER ARTHROSCOPY WITH ROTATOR CUFF REPAIR AND SUBACROMIAL DECOMPRESSION;  Surgeon: Meredith Pel, MD;  Location: [place];  Service: Orthopedics;  Laterality: Left;  LEFT SHOULDER DIAGNOSTIC OPERATIVE ARTHROSCOPY, SUBACROMIAL DECOMPRESSION, ROTATOR CUFF TEAR REPAIR  . SIMPLE MASTECTOMY WITH AXILLARY SENTINEL NODE BIOPSY Left 02/15/2017   Procedure: LEFT MASTECTOMY;  Surgeon: Stark Klein, MD;  Location: [place];  Service: General;  Laterality: Left;  . TOTAL MASTECTOMY Right 12/26/2018   Procedure: RIGHT BREAST PROPHYLATIC MASTECTOMY;  Surgeon: Stark Klein, MD;  Location: [place];  Service: General;  Laterality: Right;   Past Medical History:  Diagnosis Date  . Anemia    Iron deficinecy anemia  . Anemia 05/14/2017  . Anxiety   . Anxiety and depression 05/15/2014  . Arthritis   . Breast cancer ([place])    left ,last radiation 2'15, last chemo 8'14  . Constipation 11/13/2016  . Depression   . History of chicken pox   . History of radiation therapy 09/09/13-10/28/13   45 gray to left breast, lumpectomy cavity boosted to 63 gray  . HTN (hypertension) 11/13/2016  . Hyperglycemia 01/09/2016  . Hyperlipidemia 05/14/2017  . Hypertension   . MRSA (methicillin  resistant Staphylococcus aureus) 2009   right groin area-no issues now. 04-07-14 PCR screen negative today.  . Neuropathy   . Preventative health care 11/13/2016  . Stroke ([place]) 06/2020   BP 109/68   Pulse 64   Temp 98.8 F (37.1 C)   Ht 5\' 2"  (1.575 m)   Wt 148 lb (67.1 kg)   LMP 01/20/2013   SpO2 93%   BMI 27.07 kg/m   Opioid Risk Score:   Fall Risk Score:  `1  Depression screen PHQ 2/9  Depression screen [facility] 2/9 06/30/2020 05/26/2020 05/05/2020 05/02/2019 04/30/2018 10/12/2016  Decreased Interest 1 0 2 0 0 0  Down, Depressed, Hopeless 1 0 1 0 1 0  PHQ - 2 Score 2 0 3 0 1 0  Altered sleeping - - 2 - - -  Tired, decreased energy - - 3 - - -  Change in appetite - - 2 - - -  Feeling bad or failure about yourself  - - 1 - - -  Trouble concentrating - - 1 - - -  Moving slowly or fidgety/restless - - 1 - - -  Suicidal thoughts - - 0 - - -  PHQ-9 Score - - 13 - - -  Some encounter information is confidential and restricted. Go to Review Flowsheets activity to see all data.  Some recent data might be hidden    Review of Systems  Constitutional: Negative.   HENT: Negative.   Eyes: Negative.   Respiratory: Negative.   Cardiovascular: Negative.   Gastrointestinal: Negative.   Endocrine: Negative.   Genitourinary: Negative.   Skin: Negative.   Allergic/Immunologic: Negative.   Neurological: Negative.   Hematological: Negative.   Psychiatric/Behavioral: Negative.   All other systems reviewed and are negative.      Objective:   Physical Exam Vitals and nursing note reviewed.  Constitutional:      Appearance: Normal appearance.  Cardiovascular:     Rate and Rhythm: Normal rate and regular rhythm.  Pulses: Normal pulses.     Heart sounds: Normal heart sounds.  Pulmonary:     Effort: Pulmonary effort is normal.     Breath sounds: Normal breath sounds.  Musculoskeletal:     Cervical back: Normal range of motion and neck supple.     Comments: Normal Muscle Bulk and  Muscle Testing Reveals: Upper Extremities: Full ROM and Muscle Strength 5/5 Lower Extremities: Full ROM and Muscle Strength 5/5 Arises from Table with ease Narrow Based  Gait   Skin:    General: Skin is warm and dry.  Neurological:     Mental Status: She is alert and oriented to person, place, and time.  Psychiatric:        Mood and Affect: Mood normal.        Behavior: Behavior normal.           Assessment & Plan:  1. Chemotherapy Induced Peripheral Neuropathy: Continue current medication regimen with Gabapentin. Continue to Monitor.   2. Chronic Pain Syndrome: Refilled Hydrocodone 10/325 mg one tablet three times a day as needed. #90.   20 minutes of face to face patient care time was spent during this visit. All questions were encouraged and answered.  F/U in 1 month with Dr Ranell Patrick

## 2020-08-05 ENCOUNTER — Other Ambulatory Visit: Payer: Self-pay

## 2020-08-05 ENCOUNTER — Ambulatory Visit: Payer: Medicare Other

## 2020-08-05 DIAGNOSIS — I63542 Cerebral infarction due to unspecified occlusion or stenosis of left cerebellar artery: Secondary | ICD-10-CM

## 2020-08-05 DIAGNOSIS — I6300 Cerebral infarction due to thrombosis of unspecified precerebral artery: Secondary | ICD-10-CM

## 2020-08-05 DIAGNOSIS — R2689 Other abnormalities of gait and mobility: Secondary | ICD-10-CM

## 2020-08-05 DIAGNOSIS — R2681 Unsteadiness on feet: Secondary | ICD-10-CM

## 2020-08-05 NOTE — Therapy (Signed)
[facility] [address] [place], [name], [address] Phone: [phone]   Fax:  [phone]  Physical Therapy Discharge Summary  Patient Details  Name: Kristi [name] MRN: [id number] Date of Birth: [birth year] Referring Provider (PT): Lauraine Rinne, [place]   Encounter Date: 08/05/2020   PT End of Session - 08/05/20 1900    Visit Number 7    Number of Visits 7    Date for PT Re-Evaluation 08/05/20    Progress Note Due on Visit 7    PT Start Time 1750    PT Stop Time 1805    PT Time Calculation (min) 15 min    Equipment Utilized During Treatment Gait belt    Activity Tolerance Patient tolerated treatment well    Behavior During Therapy [facility] for tasks assessed/performed           Past Medical History:  Diagnosis Date  . Anemia    Iron deficinecy anemia  . Anemia 05/14/2017  . Anxiety   . Anxiety and depression 05/15/2014  . Arthritis   . Breast cancer ([place])    left ,last radiation 2'15, last chemo 8'14  . Constipation 11/13/2016  . Depression   . History of chicken pox   . History of radiation therapy 09/09/13-10/28/13   45 gray to left breast, lumpectomy cavity boosted to 63 gray  . HTN (hypertension) 11/13/2016  . Hyperglycemia 01/09/2016  . Hyperlipidemia 05/14/2017  . Hypertension   . MRSA (methicillin resistant Staphylococcus aureus) 2009   right groin area-no issues now. 04-07-14 PCR screen negative today.  . Neuropathy   . Preventative health care 11/13/2016  . Stroke [facility]) 06/2020    Past Surgical History:  Procedure Laterality Date  . ANAL SPHINCTEROTOMY  04/2011  . APPENDECTOMY  1980  . AXILLARY LYMPH NODE DISSECTION Left 02/04/2013   Procedure: LEFT AXILLARY LYMPH NODE DISSECTION;  Surgeon: Stark Klein, MD;  Location: [place];  Service: General;  Laterality: Left;  End: 1572  . BREAST LUMPECTOMY WITH NEEDLE LOCALIZATION Left 02/04/2013   Procedure: LEFT BREAST LUMPECTOMY WITH NEEDLE LOCALIZATION;  Surgeon: Stark Klein, MD;  Location: [place];  Service: General;  Laterality: Left;  . BREAST SURGERY     Lumpectomy in april 2014  . HEMORRHOID SURGERY  04/2011   ligation  . MASTECTOMY Left 02/15/2017  . PORT-A-CATH REMOVAL N/A 04/16/2014   Procedure: REMOVAL PORT-A-CATH;  Surgeon: Stark Klein, MD;  [facility];  Service: General;  Laterality: N/A;  . PORTACATH PLACEMENT Right 02/04/2013   Procedure: INSERTION PORT-A-CATH;  Surgeon: Stark Klein, MD;  Location: [place];  Service: General;  Laterality: Right;  Start Time: 6203.  Marland Kitchen SHOULDER ARTHROSCOPY WITH ROTATOR CUFF REPAIR AND SUBACROMIAL DECOMPRESSION Left 02/24/2014   Procedure: SHOULDER ARTHROSCOPY WITH ROTATOR CUFF REPAIR AND SUBACROMIAL DECOMPRESSION;  Surgeon: Meredith Pel, MD;  Location: [place];  Service: Orthopedics;  Laterality: Left;  LEFT SHOULDER DIAGNOSTIC OPERATIVE ARTHROSCOPY, SUBACROMIAL DECOMPRESSION, ROTATOR CUFF TEAR REPAIR  . SIMPLE MASTECTOMY WITH AXILLARY SENTINEL NODE BIOPSY Left 02/15/2017   Procedure: LEFT MASTECTOMY;  Surgeon: Stark Klein, MD;  Location: [place];  Service: General;  Laterality: Left;  . TOTAL MASTECTOMY Right 12/26/2018   Procedure: RIGHT BREAST PROPHYLATIC MASTECTOMY;  Surgeon: Stark Klein, MD;  Location: [place];  Service: General;  Laterality: Right;    There were no vitals filed for this visit.   Subjective Assessment - 08/05/20 1858    Subjective I am doing well. I have been doing my  exercises.    Patient is accompained by: Family member   Daughter   Pertinent History anxiety, left breast cancer with radiation chemotherapy, hyperlipidemia, hypertension    Limitations Walking;House hold activities    How long can you sit comfortably? no issues    How long can you stand comfortably? 10 min    How long can you walk comfortably? 5-10 min    Diagnostic tests IMPRESSION:1. High-grade stenosis at the origin of the left vertebral arterywith occlusion less than 1 cm from the origin. The vessel isreconstituted  at the level of C6.2. The right vertebral artery is dominant.3. High-grade stenosis at the origin of the left external carotidartery.4. Minimal atherosclerotic changes at the right carotid bifurcationand cavernous internal carotid arteries bilaterally withoutsignificant stenosis.5. Minimal vessel wall irregularity in the mid cervical internalcarotid arteries bilaterally without significant stenosis. This mayrepresent fibromuscular dysplasia.6. Multilevel spondylosis of the cervical spine. IMPRESSION:1. Acute/subacute nonhemorrhagic infarct involving the inferomedialleft cerebellum.2. Additional acute/subacute nonhemorrhagic posterior corticalinfarct in the medial right cerebellum.3. No acute or subacute supratentorial infarct.    Patient Stated Goals take showers without shower chair, improve overall endurance, be able to drive              [facility] PT Assessment - 08/05/20 0001      Dynamic Gait Index   Level Surface Normal    Change in Gait Speed Normal    Gait with Horizontal Head Turns Normal    Gait with Vertical Head Turns Normal    Gait and Pivot Turn Normal    Step Over Obstacle Normal    Step Around Obstacles Normal    Steps Normal    Total Score 24    DGI comment: 24/24                                   PT Short Term Goals - 06/24/20 1612      PT SHORT TERM GOAL #1   Title STG=LTG             PT Long Term Goals - 07/29/20 1751      PT LONG TERM GOAL #1   Title Patient will be able to ambulate for 30 min without AD and SBA to improve walking endurance    Baseline 10 min, 25-30 min 07/29/20    Time 6    Period Weeks    Status Achieved      PT LONG TERM GOAL #2   Title Patient will demo 22/24 or better on dynamic gait index to improve balance with functional gait.    Baseline 20/24 (eval); 23/24 07/29/20    Time 6    Period Weeks    Status Achieved    Target Date 08/05/20      PT LONG TERM GOAL #3   Title Patient will be able to  stand/walk for 2 hours to be able to go shopping    Baseline 10 min; 1 hours 07/29/20 (pt doesn't shop that long as she doesn't have to shop for that [place])    Time Herman - 08/05/20 1858    Clinical Impression Statement Patient has been seen for total of 7 session from 06/24/20 to 08/05/20 for gait and balance disorder due to dizziness. Patient has progressed well and has met  all of her long term goals. Patient is currently reporting 100% return to PLOF and will be discharged from skilled PT with independent home exercise program.    Comorbidities anxiety, left breast cancer with radiation chemotherapy, hyperlipidemia, hypertension    PT Treatment/Interventions Patient/family education    Consulted and Agree with Plan of Care Patient           Patient will benefit from skilled therapeutic intervention in order to improve the following deficits and impairments:     Visit Diagnosis: Cerebral infarction involving left cerebellar artery (HCC)  Other abnormalities of gait and mobility  Unsteadiness on feet  Cerebrovascular accident (CVA) due to thrombosis of precerebral artery [facility])     Problem List Patient Active Problem List   Diagnosis Date Noted  . Mixed dyslipidemia 07/30/2020  . History of stroke 07/30/2020  . Palpitations 07/30/2020  . Cerebral infarction involving left cerebellar artery ([place]) 06/19/2020  . Cerebrovascular accident (CVA) ([place])   . Vertigo   . Acute blood loss anemia   . Dyslipidemia   . Acute CVA (cerebrovascular accident) ([place]) 06/15/2020  . Generalized anxiety disorder 03/17/2020  . Mild neurocognitive disorder due to multiple etiologies 03/17/2020  . Debility 01/18/2020  . Light headed 01/18/2020  . Mild cognitive impairment 08/24/2019  . Genetic testing 02/20/2019  . S/P mastectomy, right 12/26/2018  . Left upper extremity numbness 06/19/2018  . Cervical cancer screening 06/18/2018  .  Anemia 05/14/2017  . Hyperlipidemia 05/14/2017  . History of left breast cancer 02/15/2017  . Nodule of finger of left hand 11/14/2016  . Right hip pain 11/14/2016  . Knee pain, bilateral 11/14/2016  . Constipation 11/13/2016  . Preventative health care 11/13/2016  . HTN (hypertension) 11/13/2016  . Hyperglycemia 01/09/2016  . Neuropathy of hand, left 09/21/2015  . History of chicken pox   . Herpes zoster 05/15/2014  . Anxiety and depression 05/15/2014  . Mastodynia 05/15/2014  . Depressive disorder 05/15/2014    Class: Chronic  . Anxiety state 05/15/2014  . Rotator cuff tear 02/24/2014  . Strain of rotator cuff 02/24/2014  . Flushing 02/20/2014  . Major depressive disorder, recurrent episode, severe ([place]) 01/12/2014  . Shoulder joint pain 12/12/2013  . Hereditary and idiopathic peripheral neuropathy 11/11/2013  . CVA (cerebral infarction) 07/18/2013  . Transient cerebral ischemia 07/18/2013  . Cerebral artery occlusion with cerebral infarction ([place]) 07/18/2013  . Contracture of axilla 05/20/2013  . Soft tissue disorder 05/20/2013  . Essential hypertension 04/12/2013  . Abnormal glucose 04/12/2013  . Anorectal pain 04/12/2013  . Nausea without vomiting 04/12/2013  . Otalgia 04/12/2013  . Sebaceous cyst 04/12/2013  . Viral infection 04/12/2013  . Malignant neoplasm of upper-inner quadrant of female breast ([place]) 01/24/2013  . Anal fissure 05/03/2011  . Internal hemorrhoids with complication 21/09/7355    Kerrie Pleasure, PT 08/05/2020, 7:02 PM  [facility] [address] [place], [name], [address] Phone: [phone]   Fax:  [phone]  Name: Kristi [name] MRN: [id number] Date of Birth: 1956/02/12

## 2020-08-06 ENCOUNTER — Telehealth: Payer: Self-pay | Admitting: *Deleted

## 2020-08-06 LAB — TOXASSURE SELECT,+ANTIDEPR,UR

## 2020-08-06 NOTE — Telephone Encounter (Signed)
Urine drug screen for this encounter is consistent for prescribed medication 

## 2020-08-30 ENCOUNTER — Encounter
Payer: Medicare Other | Attending: Physical Medicine and Rehabilitation | Admitting: Physical Medicine and Rehabilitation

## 2020-08-30 ENCOUNTER — Other Ambulatory Visit: Payer: Self-pay

## 2020-08-30 VITALS — BP 96/61 | HR 65 | Temp 98.2°F | Ht 62.0 in | Wt 151.4 lb

## 2020-08-30 DIAGNOSIS — I63542 Cerebral infarction due to unspecified occlusion or stenosis of left cerebellar artery: Secondary | ICD-10-CM | POA: Insufficient documentation

## 2020-08-30 DIAGNOSIS — G620 Drug-induced polyneuropathy: Secondary | ICD-10-CM | POA: Diagnosis not present

## 2020-08-30 DIAGNOSIS — T451X5A Adverse effect of antineoplastic and immunosuppressive drugs, initial encounter: Secondary | ICD-10-CM | POA: Insufficient documentation

## 2020-08-30 DIAGNOSIS — G894 Chronic pain syndrome: Secondary | ICD-10-CM | POA: Diagnosis not present

## 2020-08-30 MED ORDER — HYDROCODONE-ACETAMINOPHEN 10-325 MG PO TABS
1.0000 | ORAL_TABLET | Freq: Three times a day (TID) | ORAL | 0 refills | Status: DC | PRN
Start: 2020-08-30 — End: 2020-10-06

## 2020-08-30 NOTE — Patient Instructions (Signed)
  1) Ginger 2) Blueberries 3) Salmon 4) Pumpkin seeds 5) dark chocolate 6) turmeric 7) tart cherries 8) virgin olive oil 9) chilli peppers 10) mint 11) red wine 

## 2020-08-30 NOTE — Progress Notes (Signed)
Subjective:    Patient ID: Kristi [name], female    DOB: 11/06/1956, 64 y.o.   MRN: [id number]  HPI [name] is a 64 year old female who presents for follow-up after CIR admission for cerebral artery stroke.   Pain is worse when she is walking. It is worse in her left hand and bilateral legs. She has been taking three of the Norco per day and 600/600/900 at night. Currently pain is 4/10, improved 1 point from last visit. It continues to be constant.   Her PT wanted her to go outside and walk. She is able to walk unassisted. Her goal is to get to 10 minutes. She has been doing strengthening exercises for both upper body as well as balance.   She has followed up with neurology and cardiology and a Holter monitor has been placed. She has not been advised of any abnormal rhythms thus far.   Her BP in office is 96/61. She has not been checking her BP at home. She has been alternating between the high and low doses of her BP medication as I had advised last visit.   Pain Inventory Average Pain 4 Pain Right Now 4 My pain is sharp, burning and tingling  In the last 24 hours, has pain interfered with the following? General activity 4 Relation with others 3 Enjoyment of life 4 What TIME of day is your pain at its worst? morning , daytime, evening and night Sleep (in general) Fair  Pain is worse with: walking Pain improves with: medication Relief from Meds: 5  Family History  Problem Relation Age of Onset  . Lung cancer Father   . Hypertension Father   . Thyroid cancer Father        dx in his 62s  . Cancer Father        lung, thyroid, smoker  . Breast cancer Paternal Aunt 50  . Colon cancer Paternal Aunt        dx in her 50x  . Cervical cancer Paternal Aunt        dzx in her 57s  . Ovarian cancer Cousin        dx in her lage 44s  . Breast cancer Cousin        maternal first cousin, once removed; dx in her late 51s  . Breast cancer Cousin        maternal first cousin once  removed; dx in late 82s  . Hypertension Mother   . Diabetes Mother   . Dementia Mother   . Memory loss Mother   . Hypertension Brother   . Seizures Brother        Alcohol induced.  . Alcohol abuse Brother        drinker, smoker  . Cancer Paternal Uncle        oral cancer  . Kidney cancer Paternal Grandmother   . Arthritis Daughter        back surgery  . Cancer Cousin        several paternal cousins with brain cancer, leukemia, and other cancers  . Cancer Sister        stomach   Social History   Socioeconomic History  . Marital status: Married    Spouse name: [name]  . Number of children: 1  . Years of education: 31  . Highest education level: Not on file  Occupational History  . Occupation: INVENTORY Hotel manager: [employer]  Tobacco  Use  . Smoking status: Never Smoker  . Smokeless tobacco: Never Used  Vaping Use  . Vaping Use: Never used  Substance and Sexual Activity  . Alcohol use: No  . Drug use: No  . Sexual activity: Yes    Comment: lives with husband, disability/retirement. [employer], no dietary restrictions  Other Topics Concern  . Not on file  Social History Narrative   Patient is married [name]) and lives at home with her husband.   Patient has one daughter.   Patient is working at [employer]   Patient has a 12th grade education.   Patient drinks very little caffeine.   Social Determinants of Health   Financial Resource Strain: Low Risk   . Difficulty of Paying Living Expenses: Not hard at all  Food Insecurity: No Food Insecurity  . Worried About Charity fundraiser in the Last Year: Never true  . Ran Out of Food in the Last Year: Never true  Transportation Needs: No Transportation Needs  . Lack of Transportation (Medical): No  . Lack of Transportation (Non-Medical): No  Physical Activity:   . Days of Exercise per Week: Not on file  . Minutes of Exercise per Session: Not on file  Stress:   . Feeling of Stress : Not on  file  Social Connections:   . Frequency of Communication with Friends and Family: Not on file  . Frequency of Social Gatherings with Friends and Family: Not on file  . Attends Religious Services: Not on file  . Active Member of Clubs or Organizations: Not on file  . Attends Archivist Meetings: Not on file  . Marital Status: Not on file   Past Surgical History:  Procedure Laterality Date  . ANAL SPHINCTEROTOMY  04/2011  . APPENDECTOMY  1980  . AXILLARY LYMPH NODE DISSECTION Left 02/04/2013   Procedure: LEFT AXILLARY LYMPH NODE DISSECTION;  Surgeon: Stark Klein, MD;  Location: [place];  Service: General;  Laterality: Left;  End: 4270  . BREAST LUMPECTOMY WITH NEEDLE LOCALIZATION Left 02/04/2013   Procedure: LEFT BREAST LUMPECTOMY WITH NEEDLE LOCALIZATION;  Surgeon: Stark Klein, MD;  Location: [place];  Service: General;  Laterality: Left;  . BREAST SURGERY     Lumpectomy in april 2014  . HEMORRHOID SURGERY  04/2011   ligation  . MASTECTOMY Left 02/15/2017  . PORT-A-CATH REMOVAL N/A 04/16/2014   Procedure: REMOVAL PORT-A-CATH;  Surgeon: Stark Klein, MD;  [facility];  Service: General;  Laterality: N/A;  . PORTACATH PLACEMENT Right 02/04/2013   Procedure: INSERTION PORT-A-CATH;  Surgeon: Stark Klein, MD;  Location: [place];  Service: General;  Laterality: Right;  Start Time: 6237.  Marland Kitchen SHOULDER ARTHROSCOPY WITH ROTATOR CUFF REPAIR AND SUBACROMIAL DECOMPRESSION Left 02/24/2014   Procedure: SHOULDER ARTHROSCOPY WITH ROTATOR CUFF REPAIR AND SUBACROMIAL DECOMPRESSION;  Surgeon: Meredith Pel, MD;  Location: [place];  Service: Orthopedics;  Laterality: Left;  LEFT SHOULDER DIAGNOSTIC OPERATIVE ARTHROSCOPY, SUBACROMIAL DECOMPRESSION, ROTATOR CUFF TEAR REPAIR  . SIMPLE MASTECTOMY WITH AXILLARY SENTINEL NODE BIOPSY Left 02/15/2017   Procedure: LEFT MASTECTOMY;  Surgeon: Stark Klein, MD;  Location: [place];  Service: General;  Laterality: Left;  . TOTAL MASTECTOMY Right 12/26/2018   Procedure:  RIGHT BREAST PROPHYLATIC MASTECTOMY;  Surgeon: Stark Klein, MD;  Location: [place];  Service: General;  Laterality: Right;   Past Surgical History:  Procedure Laterality Date  . ANAL SPHINCTEROTOMY  04/2011  . APPENDECTOMY  1980  . AXILLARY LYMPH NODE DISSECTION Left 02/04/2013  Procedure: LEFT AXILLARY LYMPH NODE DISSECTION;  Surgeon: Stark Klein, MD;  Location: [place];  Service: General;  Laterality: Left;  End: 3614  . BREAST LUMPECTOMY WITH NEEDLE LOCALIZATION Left 02/04/2013   Procedure: LEFT BREAST LUMPECTOMY WITH NEEDLE LOCALIZATION;  Surgeon: Stark Klein, MD;  Location: [place];  Service: General;  Laterality: Left;  . BREAST SURGERY     Lumpectomy in april 2014  . HEMORRHOID SURGERY  04/2011   ligation  . MASTECTOMY Left 02/15/2017  . PORT-A-CATH REMOVAL N/A 04/16/2014   Procedure: REMOVAL PORT-A-CATH;  Surgeon: Stark Klein, MD;  [facility];  Service: General;  Laterality: N/A;  . PORTACATH PLACEMENT Right 02/04/2013   Procedure: INSERTION PORT-A-CATH;  Surgeon: Stark Klein, MD;  Location: [place];  Service: General;  Laterality: Right;  Start Time: 4315.  Marland Kitchen SHOULDER ARTHROSCOPY WITH ROTATOR CUFF REPAIR AND SUBACROMIAL DECOMPRESSION Left 02/24/2014   Procedure: SHOULDER ARTHROSCOPY WITH ROTATOR CUFF REPAIR AND SUBACROMIAL DECOMPRESSION;  Surgeon: Meredith Pel, MD;  Location: [place];  Service: Orthopedics;  Laterality: Left;  LEFT SHOULDER DIAGNOSTIC OPERATIVE ARTHROSCOPY, SUBACROMIAL DECOMPRESSION, ROTATOR CUFF TEAR REPAIR  . SIMPLE MASTECTOMY WITH AXILLARY SENTINEL NODE BIOPSY Left 02/15/2017   Procedure: LEFT MASTECTOMY;  Surgeon: Stark Klein, MD;  Location: [place];  Service: General;  Laterality: Left;  . TOTAL MASTECTOMY Right 12/26/2018   Procedure: RIGHT BREAST PROPHYLATIC MASTECTOMY;  Surgeon: Stark Klein, MD;  Location: [place];  Service: General;  Laterality: Right;   Past Medical History:  Diagnosis Date  . Anemia    Iron deficinecy anemia  . Anemia 05/14/2017  .  Anxiety   . Anxiety and depression 05/15/2014  . Arthritis   . Breast cancer ([place])    left ,last radiation 2'15, last chemo 8'14  . Constipation 11/13/2016  . Depression   . History of chicken pox   . History of radiation therapy 09/09/13-10/28/13   45 gray to left breast, lumpectomy cavity boosted to 63 gray  . HTN (hypertension) 11/13/2016  . Hyperglycemia 01/09/2016  . Hyperlipidemia 05/14/2017  . Hypertension   . MRSA (methicillin resistant Staphylococcus aureus) 2009   right groin area-no issues now. 04-07-14 PCR screen negative today.  . Neuropathy   . Preventative health care 11/13/2016  . Stroke ([place]) 06/2020   BP 96/61   Pulse 65   Temp 98.2 F (36.8 C)   Ht 5\' 2"  (1.575 m)   Wt 151 lb 6.4 oz (68.7 kg)   LMP 01/20/2013   SpO2 95%   BMI 27.69 kg/m   Opioid Risk Score:   Fall Risk Score:  `1  Depression screen PHQ 2/9  Depression screen [facility] 2/9 06/30/2020 05/26/2020 05/05/2020 05/02/2019 04/30/2018 10/12/2016  Decreased Interest 1 0 2 0 0 0  Down, Depressed, Hopeless 1 0 1 0 1 0  PHQ - 2 Score 2 0 3 0 1 0  Altered sleeping - - 2 - - -  Tired, decreased energy - - 3 - - -  Change in appetite - - 2 - - -  Feeling bad or failure about yourself  - - 1 - - -  Trouble concentrating - - 1 - - -  Moving slowly or fidgety/restless - - 1 - - -  Suicidal thoughts - - 0 - - -  PHQ-9 Score - - 13 - - -  Some encounter information is confidential and restricted. Go to Review Flowsheets activity to see all data.  Some recent data might be hidden   Review of  Systems     Objective:   Physical Exam Gen: no distress, normal appearing HEENT: oral mucosa pink and moist, NCAT Cardio: Reg rate Chest: normal effort, normal rate of breathing Abd: soft, non-distended Ext: no edema Skin: intact Neuro: Alert and oriented x3. Able to walk in and out of the office well.  Musculoskeletal: 5/5 strength in bilateral lower extremities and and upper extremities. Sensation intact throughout.  Psych:  pleasant, normal affect    Assessment & Plan:  Mrs. Chipman is a 64 year old woman who presents with bilateral foot painful neuropathy since starting Anastrazole chemotherapy for breast cancer in 2015. She was also recently admitted to [facility] for left cerebellar artery stroke.   -Reviewed CIR notes.   -Continue Gabapentin 600/600/800mg . She does not require refill.   -Pain contract signed previously.   -UDS obtained previously. Personally reviewed and contains expected metabolites.   -PDMP reviewed. Norco 10mg  up to 3 times daily refilled. MME 30.   -Provided referral to [facility] for evaluation for lumbar sympathetic nerve block.   Hypotension: 99/64 in office. Advised that she log pressures at home and bring to our follow-up appointment. Continue only with half-dose of Valsartan-HCTZ daily. If BP is >120/80 any day, she may take the full dose instead. Normalizing her BP should help her dizziness. Explained mechanism for orthostatic hypotension.   General health: Educated regarding low carb/sugar diet and discussed her current diet. Advise regarding anti-inflammatory foods.   All questions answered. RTC in 4 weeks.

## 2020-08-31 ENCOUNTER — Encounter: Payer: Self-pay | Admitting: Physical Medicine and Rehabilitation

## 2020-09-01 ENCOUNTER — Ambulatory Visit: Payer: Medicare Other | Admitting: Neurology

## 2020-09-01 ENCOUNTER — Other Ambulatory Visit: Payer: Self-pay

## 2020-09-01 ENCOUNTER — Encounter: Payer: Self-pay | Admitting: Neurology

## 2020-09-01 VITALS — BP 109/66 | HR 55 | Ht 62.0 in | Wt 153.0 lb

## 2020-09-01 DIAGNOSIS — I63542 Cerebral infarction due to unspecified occlusion or stenosis of left cerebellar artery: Secondary | ICD-10-CM | POA: Diagnosis not present

## 2020-09-01 NOTE — Progress Notes (Signed)
Reason for visit: Cerebellar stroke  Referring physician: [facility]  Kristi [name] is a 64 y.o. female  History of present illness:  Kristi [name] is a 64 year old left-handed black female with a history of some reported memory issues, but she also has a severe anxiety and depression problem.  She has a history of a cervical myelopathy and a peripheral neuropathy associated with chemotherapy for prior breast cancer.  The patient was admitted to the hospital on 15 June 2020 with onset of dizziness.  The patient had a mild headache around that time as well.  She reported no double vision or loss of vision or any numbness or weakness of the extremities.  The patient underwent evaluation and was found to have bilateral cerebellar strokes, more prominent on the left than the right.  A CT angiogram showed occlusion of the left vertebral artery with reconstitution distally.  The patient presented with significant issues with balance, she was not falling but had to hold onto things to walk.  She reported no slurring of speech or difficulty swallowing.  The patient underwent a 2D echocardiogram that did not identify a cardioembolic source of stroke.  The patient has had a cardiac monitor study placed, she will be sent in the monitor today.  The patient was placed on aspirin and Plavix and after 90 days the Plavix was discontinued.  She remains on aspirin 325 mg.  She did undergo some physical therapy but this was stopped about a month ago.  She is walking quite well, she believes that she is back to her usual baseline.  She comes to this office for an evaluation.  Past Medical History:  Diagnosis Date   Anemia    Iron deficinecy anemia   Anemia 05/14/2017   Anxiety    Anxiety and depression 05/15/2014   Arthritis    Breast cancer ([place])    left ,last radiation 2'15, last chemo 8'14   Constipation 11/13/2016   Depression    History of chicken pox    History of radiation therapy  09/09/13-10/28/13   45 gray to left breast, lumpectomy cavity boosted to 63 gray   HTN (hypertension) 11/13/2016   Hyperglycemia 01/09/2016   Hyperlipidemia 05/14/2017   Hypertension    MRSA (methicillin resistant Staphylococcus aureus) 2009   right groin area-no issues now. 04-07-14 PCR screen negative today.   Neuropathy    Preventative health care 11/13/2016   Stroke ([place]) 06/2020    Past Surgical History:  Procedure Laterality Date   ANAL SPHINCTEROTOMY  04/2011   APPENDECTOMY  1980   AXILLARY LYMPH NODE DISSECTION Left 02/04/2013   Procedure: LEFT AXILLARY LYMPH NODE DISSECTION;  Surgeon: Stark Klein, MD;  Location: [place];  Service: General;  Laterality: Left;  End: 5956   BREAST LUMPECTOMY WITH NEEDLE LOCALIZATION Left 02/04/2013   Procedure: LEFT BREAST LUMPECTOMY WITH NEEDLE LOCALIZATION;  Surgeon: Stark Klein, MD;  Location: [place];  Service: General;  Laterality: Left;   BREAST SURGERY     Lumpectomy in april 2014   [place]  04/2011   ligation   MASTECTOMY Left 02/15/2017   PORT-A-CATH REMOVAL N/A 04/16/2014   Procedure: REMOVAL PORT-A-CATH;  Surgeon: Stark Klein, MD;  Location: [facility];  Service: General;  Laterality: N/A;   PORTACATH PLACEMENT Right 02/04/2013   Procedure: INSERTION PORT-A-CATH;  Surgeon: Stark Klein, MD;  Location: [place];  Service: General;  Laterality: Right;  Start Time: 3875.   SHOULDER ARTHROSCOPY WITH ROTATOR CUFF REPAIR AND SUBACROMIAL  DECOMPRESSION Left 02/24/2014   Procedure: SHOULDER ARTHROSCOPY WITH ROTATOR CUFF REPAIR AND SUBACROMIAL DECOMPRESSION;  Surgeon: Meredith Pel, MD;  Location: [place];  Service: Orthopedics;  Laterality: Left;  LEFT SHOULDER DIAGNOSTIC OPERATIVE ARTHROSCOPY, SUBACROMIAL DECOMPRESSION, ROTATOR CUFF TEAR REPAIR   SIMPLE MASTECTOMY WITH AXILLARY SENTINEL NODE BIOPSY Left 02/15/2017   Procedure: LEFT MASTECTOMY;  Surgeon: Stark Klein, MD;  Location: [place];  Service: General;  Laterality: Left;   TOTAL  MASTECTOMY Right 12/26/2018   Procedure: RIGHT BREAST PROPHYLATIC MASTECTOMY;  Surgeon: Stark Klein, MD;  Location: [place];  Service: General;  Laterality: Right;    Family History  Problem Relation Age of Onset   Lung cancer Father    Hypertension Father    Thyroid cancer Father        dx in his 63s   Cancer Father        lung, thyroid, smoker   Breast cancer Paternal Aunt 64   Colon cancer Paternal Aunt        dx in her 50x   Cervical cancer Paternal Aunt        dzx in her 61s   Ovarian cancer Cousin        dx in her lage 52s   Breast cancer Cousin        maternal first cousin, once removed; dx in her late 54s   Breast cancer Cousin        maternal first cousin once removed; dx in late 37s   Hypertension Mother    Diabetes Mother    Dementia Mother    Memory loss Mother    Hypertension Brother    Seizures Brother        Alcohol induced.   Alcohol abuse Brother        drinker, smoker   Cancer Paternal Uncle        oral cancer   Kidney cancer Paternal Grandmother    Arthritis Daughter        back surgery   Cancer Cousin        several paternal cousins with brain cancer, leukemia, and other cancers   Cancer Sister        stomach    Social history:  reports that she has never smoked. She has never used smokeless tobacco. She reports that she does not drink alcohol and does not use drugs.  Medications:  Prior to Admission medications   Medication Sig Start Date End Date Taking? Authorizing Provider  anastrozole (ARIMIDEX) 1 MG tablet Take 1 tablet (1 mg total) by mouth daily. TAKE 1 TABLET (1 MG) BY MOUTH DAILY IN THE MORNING 05/21/20   Nicholas Lose, MD  Apoaequorin (PREVAGEN) 10 MG CAPS Take 1 capsule by mouth daily. 05/21/20   Nicholas Lose, MD  aspirin EC 325 MG EC tablet Take 1 tablet (325 mg total) by mouth daily. 06/19/20   Arrien, Jimmy Picket, MD  atorvastatin (LIPITOR) 40 MG tablet Take 1 tablet (40 mg total) by mouth daily. 07/19/20  08/18/20  Mosie Lukes, MD  gabapentin (NEURONTIN) 600 MG tablet Take 900 mg by mouth 2 (two) times daily.  07/22/20   [provider]  gabapentin (NEURONTIN) 800 MG tablet Take 800 mg by mouth at bedtime. 07/06/20   [provider]  HYDROcodone-acetaminophen (NORCO) 10-325 MG tablet Take 1 tablet by mouth 3 (three) times daily as needed. 08/30/20   Raulkar, Clide Deutscher, MD  hydrOXYzine (ATARAX/VISTARIL) 50 MG tablet Take 1 tablet (50 mg total) by mouth  2 (two) times daily. 06/21/20   Angiulli, Lavon Paganini, PA-C  LATUDA 80 MG TABS tablet Take 0.5 tablets (40 mg total) by mouth at bedtime. 05/21/20   Nicholas Lose, MD  Magnesium 250 MG TABS Take 1 tablet (250 mg total) by mouth daily. 06/21/20   Angiulli, Lavon Paganini, PA-C  Multiple Vitamin (MULTIVITAMIN WITH MINERALS) TABS tablet Take 1 tablet by mouth daily.    [provider]  polyethylene glycol (MIRALAX / GLYCOLAX) 17 g packet Take 17 g by mouth daily as needed for mild constipation. 06/19/20   Arrien, Jimmy Picket, MD  traZODone (DESYREL) 50 MG tablet Take 1 tablet (50 mg total) by mouth at bedtime. 06/21/20   Angiulli, Lavon Paganini, PA-C  valsartan-hydrochlorothiazide (DIOVAN HCT) 160-12.5 MG tablet Take 1 tablet by mouth daily. 06/30/20   Raulkar, Clide Deutscher, MD  valsartan-hydrochlorothiazide (DIOVAN-HCT) 320-25 MG tablet TAKE 1 TABLET BY MOUTH EVERY DAY IN THE MORNING 06/21/20   Angiulli, Lavon Paganini, PA-C  prochlorperazine (COMPAZINE) 10 MG tablet Take 1 tablet (10 mg total) by mouth every 6 (six) hours as needed (Nausea or vomiting). 05/07/13 12/08/19  Gardenia Phlegm, NP  prochlorperazine (COMPAZINE) 25 MG suppository Place 1 suppository (25 mg total) rectally every 12 (twelve) hours as needed for nausea. 05/07/13 07/24/13  Gardenia Phlegm, NP     No Known Allergies  ROS:  Out of a complete 14 system review of symptoms, the patient complains only of the following symptoms, and all other reviewed systems are  negative.  Anxiety depression Mild memory problems  Blood pressure 109/66, pulse (!) 55, height 5\' 2"  (1.575 m), weight 153 lb (69.4 kg), last menstrual period 01/20/2013.  Physical Exam  General: The patient is alert and cooperative at the time of the examination.  Eyes: Pupils are equal, round, and reactive to light. Discs are flat bilaterally.  Neck: The neck is supple, a left carotid bruit is noted.  Respiratory: The respiratory examination is clear.  Cardiovascular: The cardiovascular examination reveals a regular rate and rhythm, no obvious murmurs or rubs are noted.  Skin: Extremities are without significant edema.  Neurologic Exam  Mental status: The patient is alert and oriented x 3 at the time of the examination. The patient has apparent normal recent and remote memory, with an apparently normal attention span and concentration ability.  Cranial nerves: Facial symmetry is present. There is good sensation of the face to pinprick and soft touch bilaterally. The strength of the facial muscles and the muscles to head turning and shoulder shrug are normal bilaterally. Speech is well enunciated, no aphasia or dysarthria is noted. Extraocular movements are full. Visual fields are full. The tongue is midline, and the patient has symmetric elevation of the soft palate. No obvious hearing deficits are noted.  Motor: The motor testing reveals 5 over 5 strength of all 4 extremities. Good symmetric motor tone is noted throughout.  Sensory: Sensory testing is intact to pinprick, soft touch, vibration sensation, and position sense on all 4 extremities. No evidence of extinction is noted.  Coordination: Cerebellar testing reveals good finger-nose-finger and heel-to-shin bilaterally.  Gait and station: Gait is normal. Tandem gait is minimally unsteady. Romberg is negative. No drift is seen.  Reflexes: Deep tendon reflexes are symmetric, but somewhat depressed bilaterally. Toes are  downgoing bilaterally.    MRI brain 06/16/20:  IMPRESSION: 1. Acute/subacute nonhemorrhagic infarct involving the inferomedial left cerebellum. 2. Additional acute/subacute nonhemorrhagic posterior cortical infarct in the medial right cerebellum. 3. No acute or  subacute supratentorial infarct.  * MRI scan images were reviewed online. I agree with the written report.   CTA head and neck 06/16/20:  IMPRESSION: 1. High-grade stenosis at the origin of the left vertebral artery with occlusion less than 1 cm from the origin. The vessel is reconstituted at the level of C6. 2. The right vertebral artery is dominant. 3. High-grade stenosis at the origin of the left external carotid artery. 4. Minimal atherosclerotic changes at the right carotid bifurcation and cavernous internal carotid arteries bilaterally without significant stenosis. 5. Minimal vessel wall irregularity in the mid cervical internal carotid arteries bilaterally without significant stenosis. This may represent fibromuscular dysplasia. 6. Multilevel spondylosis of the cervical spine.   2D echo 06/16/20:  IMPRESSIONS    1. Left ventricular ejection fraction, by estimation, is 60 to 65%. The  left ventricle has normal function. The left ventricle has no regional  wall motion abnormalities. Left ventricular diastolic parameters are  consistent with Grade I diastolic  dysfunction (impaired relaxation).  2. Right ventricular systolic function is normal. The right ventricular  size is normal. Tricuspid regurgitation signal is inadequate for assessing  PA pressure.  3. The mitral valve is normal in structure. No evidence of mitral valve  regurgitation. No evidence of mitral stenosis.  4. The aortic valve is tricuspid. Aortic valve regurgitation is not  visualized. No aortic stenosis is present.  5. The inferior vena cava is normal in size with greater than 50%  respiratory variability, suggesting right atrial  pressure of 3 mmHg.     Assessment/Plan:  1.  Recent bilateral cerebellar strokes  2.  Mild memory disturbance  3.  Anxiety depression  The patient is doing quite well following the stroke event in August 2021.  She reports that her memory issues have improved following adjustments of medication.  Overall, she is functioning quite well, her anxiety issues have also gotten better, she will only have an occasional panic attack, previously she was having events on a daily basis.  She will follow up through this office on as-needed basis.  She will be maintained on aspirin unless the cardiac monitor study reveals atrial fibrillation.  She was placed on a statin drug for cholesterol, the LDL will need to be maintained at or below 70.  The patient does not smoke, her blood pressures are well maintained currently.  Jill Alexanders MD 09/01/2020 8:48 AM  [facility] [address] [place], [place] [address]  Phone [phone] Fax [phone]

## 2020-09-08 ENCOUNTER — Telehealth: Payer: Self-pay

## 2020-09-08 NOTE — Telephone Encounter (Signed)
Patient dropped off [employer] form for completion.  Form needs to be completed and submitted back to patient's life insurance company by 09/30/2020.  Spoke with Percell Miller, since he has seen the patient in the past, but he is uncomfortable completing the form for the patient.  Form placed in provider's box for pick up and completion. Routed to cma

## 2020-09-09 NOTE — Telephone Encounter (Signed)
Last PCP visit with Percell Miller and Neuro note attached and in your bin to complete and add signature

## 2020-09-09 NOTE — Telephone Encounter (Signed)
Virtual appt tomorrow at 10 am with pcp

## 2020-09-10 ENCOUNTER — Telehealth: Payer: Medicare Other | Admitting: Family Medicine

## 2020-09-10 ENCOUNTER — Other Ambulatory Visit: Payer: Self-pay

## 2020-09-10 VITALS — BP 138/70 | Wt 149.0 lb

## 2020-09-11 NOTE — Progress Notes (Signed)
Patient unable to get on a video visit she has been rescheduled to next week in person.

## 2020-09-14 ENCOUNTER — Other Ambulatory Visit: Payer: Self-pay

## 2020-09-14 ENCOUNTER — Telehealth (INDEPENDENT_AMBULATORY_CARE_PROVIDER_SITE_OTHER): Payer: Medicare Other | Admitting: Family Medicine

## 2020-09-14 DIAGNOSIS — R739 Hyperglycemia, unspecified: Secondary | ICD-10-CM | POA: Diagnosis not present

## 2020-09-14 DIAGNOSIS — I10 Essential (primary) hypertension: Secondary | ICD-10-CM | POA: Diagnosis not present

## 2020-09-14 DIAGNOSIS — Z8673 Personal history of transient ischemic attack (TIA), and cerebral infarction without residual deficits: Secondary | ICD-10-CM

## 2020-09-14 DIAGNOSIS — E785 Hyperlipidemia, unspecified: Secondary | ICD-10-CM | POA: Diagnosis not present

## 2020-09-14 NOTE — Assessment & Plan Note (Signed)
She has h/o cerebellar infarct left > right with persistent neurologic and musculoskeletal deficits. She is in need of disability paperwork to be filled out. Completed today

## 2020-09-14 NOTE — Assessment & Plan Note (Signed)
hgba1c acceptable, minimize simple carbs. Increase exercise as tolerated.  

## 2020-09-14 NOTE — Assessment & Plan Note (Signed)
Monitor and report any oncerns, no changes to meds. Encouraged heart healthy diet such as the DASH diet and exercise as tolerated.

## 2020-09-14 NOTE — Assessment & Plan Note (Signed)
Tolerating statin, encouraged heart healthy diet, avoid trans fats, minimize simple carbs and saturated fats. Increase exercise as tolerated 

## 2020-09-14 NOTE — Progress Notes (Signed)
Virtual Visit via Video Note  I connected with Kristi [name] on 09/14/20 at  3:40 PM EST by a video enabled telemedicine application and verified that I am speaking with the correct person using two identifiers.  Location: Patient: home, patient and provider in visit Provider: office   I discussed the limitations of evaluation and management by telemedicine and the availability of in person appointments. The patient expressed understanding and agreed to proceed. S Chism, CMA was able to set the patient up on a video visit   Subjective:    Patient ID: Kristi [name], female    DOB: 12/21/1955, 64 y.o.   MRN: [id number]  Chief Complaint  Patient presents with  . Consult    HPI Patient is in today for follow up on chronic medical concerns and follow up on cerebrovascular accident and persistent left sided weakness and some mild memory deficits and anxiety. She is in need of some paper work being completed. Denies CP/palp/SOB/HA/congestion/fevers/GI or GU c/o. Taking meds as prescribed  Past Medical History:  Diagnosis Date  . Anemia    Iron deficinecy anemia  . Anemia 05/14/2017  . Anxiety   . Anxiety and depression 05/15/2014  . Arthritis   . Breast cancer ([place])    left ,last radiation 2'15, last chemo 8'14  . Constipation 11/13/2016  . Depression   . History of chicken pox   . History of radiation therapy 09/09/13-10/28/13   45 gray to left breast, lumpectomy cavity boosted to 63 gray  . HTN (hypertension) 11/13/2016  . Hyperglycemia 01/09/2016  . Hyperlipidemia 05/14/2017  . Hypertension   . MRSA (methicillin resistant Staphylococcus aureus) 2009   right groin area-no issues now. 04-07-14 PCR screen negative today.  . Neuropathy   . Preventative health care 11/13/2016  . Stroke [facility]) 06/2020    Past Surgical History:  Procedure Laterality Date  . ANAL SPHINCTEROTOMY  04/2011  . APPENDECTOMY  1980  . AXILLARY LYMPH NODE DISSECTION Left 02/04/2013   Procedure: LEFT AXILLARY LYMPH  NODE DISSECTION;  Surgeon: Stark Klein, MD;  Location: [place];  Service: General;  Laterality: Left;  End: 4656  . BREAST LUMPECTOMY WITH NEEDLE LOCALIZATION Left 02/04/2013   Procedure: LEFT BREAST LUMPECTOMY WITH NEEDLE LOCALIZATION;  Surgeon: Stark Klein, MD;  Location: [place];  Service: General;  Laterality: Left;  . BREAST SURGERY     Lumpectomy in april 2014  . HEMORRHOID SURGERY  04/2011   ligation  . MASTECTOMY Left 02/15/2017  . PORT-A-CATH REMOVAL N/A 04/16/2014   Procedure: REMOVAL PORT-A-CATH;  Surgeon: Stark Klein, MD;  Location: [facility];  Service: General;  Laterality: N/A;  . PORTACATH PLACEMENT Right 02/04/2013   Procedure: INSERTION PORT-A-CATH;  Surgeon: Stark Klein, MD;  Location: [place];  Service: General;  Laterality: Right;  Start Time: 8127.  Marland Kitchen SHOULDER ARTHROSCOPY WITH ROTATOR CUFF REPAIR AND SUBACROMIAL DECOMPRESSION Left 02/24/2014   Procedure: SHOULDER ARTHROSCOPY WITH ROTATOR CUFF REPAIR AND SUBACROMIAL DECOMPRESSION;  Surgeon: Meredith Pel, MD;  Location: [place];  Service: Orthopedics;  Laterality: Left;  LEFT SHOULDER DIAGNOSTIC OPERATIVE ARTHROSCOPY, SUBACROMIAL DECOMPRESSION, ROTATOR CUFF TEAR REPAIR  . SIMPLE MASTECTOMY WITH AXILLARY SENTINEL NODE BIOPSY Left 02/15/2017   Procedure: LEFT MASTECTOMY;  Surgeon: Stark Klein, MD;  Location: [place];  Service: General;  Laterality: Left;  . TOTAL MASTECTOMY Right 12/26/2018   Procedure: RIGHT BREAST PROPHYLATIC MASTECTOMY;  Surgeon: Stark Klein, MD;  Location: [place];  Service: General;  Laterality: Right;    Family History  Problem Relation Age of Onset  . Lung cancer Father   . Hypertension Father   . Thyroid cancer Father        dx in his 26s  . Cancer Father        lung, thyroid, smoker  . Breast cancer Paternal Aunt 60  . Colon cancer Paternal Aunt        dx in her 50x  . Cervical cancer Paternal Aunt        dzx in her 36s  . Ovarian cancer Cousin        dx in her lage 64s  . Breast cancer Cousin         maternal first cousin, once removed; dx in her late 71s  . Breast cancer Cousin        maternal first cousin once removed; dx in late 19s  . Hypertension Mother   . Diabetes Mother   . Dementia Mother   . Memory loss Mother   . Hypertension Brother   . Seizures Brother        Alcohol induced.  . Alcohol abuse Brother        drinker, smoker  . Cancer Paternal Uncle        oral cancer  . Kidney cancer Paternal Grandmother   . Arthritis Daughter        back surgery  . Cancer Cousin        several paternal cousins with brain cancer, leukemia, and other cancers  . Cancer Sister        stomach    Social History   Socioeconomic History  . Marital status: Married    Spouse name: [name]  . Number of children: 1  . Years of education: 91  . Highest education level: Not on file  Occupational History  . Occupation: INVENTORY Hotel manager: [employer]  . Occupation: disability no longer working 2015  Tobacco Use  . Smoking status: Never Smoker  . Smokeless tobacco: Never Used  Vaping Use  . Vaping Use: Never used  Substance and Sexual Activity  . Alcohol use: No  . Drug use: No  . Sexual activity: Yes    Comment: lives with husband, disability/retirement. [employer] devices, no dietary restrictions  Other Topics Concern  . Not on file  Social History Narrative   Patient is married [name]) and lives at home with her husband.   Patient has one daughter   Patient drinks very little caffeine.   Left handed   Social Determinants of Health   Financial Resource Strain: Low Risk   . Difficulty of Paying Living Expenses: Not hard at all  Food Insecurity: No Food Insecurity  . Worried About Charity fundraiser in the Last Year: Never true  . Ran Out of Food in the Last Year: Never true  Transportation Needs: No Transportation Needs  . Lack of Transportation (Medical): No  . Lack of Transportation (Non-Medical): No  Physical Activity:   . Days of Exercise  per Week: Not on file  . Minutes of Exercise per Session: Not on file  Stress:   . Feeling of Stress : Not on file  Social Connections:   . Frequency of Communication with Friends and Family: Not on file  . Frequency of Social Gatherings with Friends and Family: Not on file  . Attends Religious Services: Not on file  . Active Member of Clubs or Organizations: Not on file  . Attends Club  or Organization Meetings: Not on file  . Marital Status: Not on file  Intimate Partner Violence:   . Fear of Current or Ex-Partner: Not on file  . Emotionally Abused: Not on file  . Physically Abused: Not on file  . Sexually Abused: Not on file    Outpatient Medications Prior to Visit  Medication Sig Dispense Refill  . anastrozole (ARIMIDEX) 1 MG tablet Take 1 tablet (1 mg total) by mouth daily. TAKE 1 TABLET (1 MG) BY MOUTH DAILY IN THE MORNING 90 tablet 3  . Apoaequorin (PREVAGEN) 10 MG CAPS Take 1 capsule by mouth daily.    Marland Kitchen aspirin EC 325 MG EC tablet Take 1 tablet (325 mg total) by mouth daily. 30 tablet 0  . atorvastatin (LIPITOR) 40 MG tablet Take 1 tablet (40 mg total) by mouth daily. 90 tablet 1  . gabapentin (NEURONTIN) 600 MG tablet Take 900 mg by mouth 2 (two) times daily.     Marland Kitchen gabapentin (NEURONTIN) 800 MG tablet Take 800 mg by mouth at bedtime.    Marland Kitchen HYDROcodone-acetaminophen (NORCO) 10-325 MG tablet Take 1 tablet by mouth 3 (three) times daily as needed. 90 tablet 0  . hydrOXYzine (ATARAX/VISTARIL) 50 MG tablet Take 1 tablet (50 mg total) by mouth 2 (two) times daily. 30 tablet 0  . LATUDA 80 MG TABS tablet Take 0.5 tablets (40 mg total) by mouth at bedtime. 30 tablet 1  . Magnesium 250 MG TABS Take 1 tablet (250 mg total) by mouth daily. 30 tablet 0  . Multiple Vitamin (MULTIVITAMIN WITH MINERALS) TABS tablet Take 1 tablet by mouth daily.    . polyethylene glycol (MIRALAX / GLYCOLAX) 17 g packet Take 17 g by mouth daily as needed for mild constipation. 14 each 0  . traZODone (DESYREL)  50 MG tablet Take 1 tablet (50 mg total) by mouth at bedtime. 30 tablet 0  . valsartan-hydrochlorothiazide (DIOVAN HCT) 160-12.5 MG tablet Take 1 tablet by mouth daily. 30 tablet 3   No facility-administered medications prior to visit.    No Known Allergies  Review of Systems  Constitutional: Positive for malaise/fatigue. Negative for fever.  HENT: Negative for congestion.   Eyes: Negative for blurred vision.  Respiratory: Negative for shortness of breath.   Cardiovascular: Negative for chest pain, palpitations and leg swelling.  Gastrointestinal: Negative for abdominal pain, blood in stool and nausea.  Genitourinary: Negative for dysuria and frequency.  Musculoskeletal: Negative for falls.  Skin: Negative for rash.  Neurological: Positive for focal weakness. Negative for dizziness, loss of consciousness and headaches.  Endo/Heme/Allergies: Negative for environmental allergies.  Psychiatric/Behavioral: Positive for memory loss. Negative for depression. The patient is nervous/anxious.        Objective:    Physical Exam Constitutional:      Appearance: Normal appearance. She is not ill-appearing.  HENT:     Head: Normocephalic and atraumatic.     Right Ear: External ear normal.     Left Ear: External ear normal.     Nose: Nose normal.  Eyes:     General:        Right eye: No discharge.        Left eye: No discharge.  Pulmonary:     Effort: Pulmonary effort is normal.  Neurological:     Mental Status: She is alert and oriented to person, place, and time.  Psychiatric:        Behavior: Behavior normal.     LMP 01/20/2013  Wt Readings from Last  3 Encounters:  09/10/20 149 lb (67.6 kg)  09/01/20 153 lb (69.4 kg)  08/30/20 151 lb 6.4 oz (68.7 kg)    Diabetic Foot Exam - Simple   No data filed     Lab Results  Component Value Date   WBC 5.6 06/28/2020   HGB 11.0 (L) 06/28/2020   HCT 33.1 (L) 06/28/2020   PLT 208 06/28/2020   GLUCOSE 95 06/28/2020   CHOL 144  06/16/2020   TRIG 68 06/16/2020   HDL 41 06/16/2020   LDLCALC 89 06/16/2020   ALT 13 06/28/2020   AST 17 06/28/2020   NA 140 06/28/2020   K 4.7 06/28/2020   CL 101 06/28/2020   CREATININE 1.27 (H) 06/28/2020   BUN 13 06/28/2020   CO2 31 06/28/2020   TSH 1.32 01/21/2020   INR 1.0 06/15/2020   HGBA1C 5.9 (H) 06/16/2020    Lab Results  Component Value Date   TSH 1.32 01/21/2020   Lab Results  Component Value Date   WBC 5.6 06/28/2020   HGB 11.0 (L) 06/28/2020   HCT 33.1 (L) 06/28/2020   MCV 84.7 06/28/2020   PLT 208 06/28/2020   Lab Results  Component Value Date   NA 140 06/28/2020   K 4.7 06/28/2020   CHLORIDE 104 01/22/2017   CO2 31 06/28/2020   GLUCOSE 95 06/28/2020   BUN 13 06/28/2020   CREATININE 1.27 (H) 06/28/2020   BILITOT 0.3 06/28/2020   ALKPHOS 66 06/21/2020   AST 17 06/28/2020   ALT 13 06/28/2020   PROT 7.0 06/28/2020   ALBUMIN 3.3 (L) 06/21/2020   CALCIUM 10.4 06/28/2020   ANIONGAP 11 06/21/2020   EGFR 67 (L) 01/22/2017   GFR 57.30 (L) 01/21/2020   Lab Results  Component Value Date   CHOL 144 06/16/2020   Lab Results  Component Value Date   HDL 41 06/16/2020   Lab Results  Component Value Date   LDLCALC 89 06/16/2020   Lab Results  Component Value Date   TRIG 68 06/16/2020   Lab Results  Component Value Date   CHOLHDL 3.5 06/16/2020   Lab Results  Component Value Date   HGBA1C 5.9 (H) 06/16/2020       Assessment & Plan:   Problem List Items Addressed This Visit    Hyperglycemia    hgba1c acceptable, minimize simple carbs. Increase exercise as tolerated.       HTN (hypertension)    Monitor and report any oncerns, no changes to meds. Encouraged heart healthy diet such as the DASH diet and exercise as tolerated.       Hyperlipidemia    Tolerating statin, encouraged heart healthy diet, avoid trans fats, minimize simple carbs and saturated fats. Increase exercise as tolerated      H/O: CVA (cerebrovascular accident)     She has h/o cerebellar infarct left > right with persistent neurologic and musculoskeletal deficits. She is in need of disability paperwork to be filled out. Completed today         I am having Kristi [name] "Kristi [name]" maintain her multivitamin with minerals, anastrozole, Latuda, Prevagen, polyethylene glycol, aspirin, Magnesium, traZODone, hydrOXYzine, valsartan-hydrochlorothiazide, atorvastatin, gabapentin, gabapentin, and HYDROcodone-acetaminophen.  No orders of the defined types were placed in this encounter.    I discussed the assessment and treatment plan with the patient. The patient was provided an opportunity to ask questions and all were answered. The patient agreed with the plan and demonstrated an understanding of the instructions.   The patient  was advised to call back or seek an in-person evaluation if the symptoms worsen or if the condition fails to improve as anticipated.  I provided 20 minutes of non-face-to-face time during this encounter.   [name], MD

## 2020-09-23 ENCOUNTER — Other Ambulatory Visit: Payer: Self-pay | Admitting: Physical Medicine and Rehabilitation

## 2020-10-06 ENCOUNTER — Other Ambulatory Visit: Payer: Self-pay

## 2020-10-06 ENCOUNTER — Encounter
Payer: Medicare Other | Attending: Physical Medicine and Rehabilitation | Admitting: Physical Medicine and Rehabilitation

## 2020-10-06 ENCOUNTER — Encounter: Payer: Self-pay | Admitting: Physical Medicine and Rehabilitation

## 2020-10-06 VITALS — BP 121/72 | HR 57 | Temp 98.8°F | Ht 62.0 in | Wt 149.4 lb

## 2020-10-06 DIAGNOSIS — I63542 Cerebral infarction due to unspecified occlusion or stenosis of left cerebellar artery: Secondary | ICD-10-CM

## 2020-10-06 DIAGNOSIS — T451X5A Adverse effect of antineoplastic and immunosuppressive drugs, initial encounter: Secondary | ICD-10-CM

## 2020-10-06 DIAGNOSIS — G620 Drug-induced polyneuropathy: Secondary | ICD-10-CM | POA: Insufficient documentation

## 2020-10-06 DIAGNOSIS — I10 Essential (primary) hypertension: Secondary | ICD-10-CM

## 2020-10-06 MED ORDER — HYDROCODONE-ACETAMINOPHEN 10-325 MG PO TABS
1.0000 | ORAL_TABLET | Freq: Three times a day (TID) | ORAL | 0 refills | Status: DC | PRN
Start: 2020-10-06 — End: 2020-10-07

## 2020-10-06 MED ORDER — HYDROCODONE-ACETAMINOPHEN 10-325 MG PO TABS
1.0000 | ORAL_TABLET | Freq: Three times a day (TID) | ORAL | 0 refills | Status: DC | PRN
Start: 2020-10-06 — End: 2020-10-06

## 2020-10-06 NOTE — Patient Instructions (Signed)
.  Turmeric to reduce inflammation--can be used in cooking or taken as a supplement.  Benefits of turmeric:  -Highly anti-inflammatory  -Increases antioxidants  -Improves memory, attention, brain disease  -Lowers risk of heart disease  -May help prevent cancer  -Decreases pain  -Alleviates depression  -Delays aging and decreases risk of chronic disease  -Consume with black pepper to increase absorption    Turmeric Milk Recipe:  1 cup milk  1 tsp turmeric  1 tsp cinnamon  1 tsp grated ginger (optional)  Black pepper (boosts the anti-inflammatory properties of turmeric).  1 tsp honey 

## 2020-10-06 NOTE — Progress Notes (Signed)
Subjective:    Patient ID: Kristi [name], female    DOB: 12-May-1956, 64 y.o.   MRN: [id number]  HPI Kristi [name] is a 64 year old female who presents for follow-up of cerebral artery stroke.   Since last visit she has increased her daily walk to 35 minutes! She usually does this around 9-10am.  Her BP has been better controlled. Systolic is usually 384Y-659D and diastolic is usually 35T.   Her pain has been stable and she continues to take three Norco per day.   She has no other complaints.   Prior history: Pain is worse when she is walking. It is worse in her left hand and bilateral legs. She has been taking three of the Norco per day and 600/600/900 at night. Currently pain is 4/10, improved 1 point from last visit. It continues to be constant.   Her PT wanted her to go outside and walk. She is able to walk unassisted. Her goal is to get to 10 minutes. She has been doing strengthening exercises for both upper body as well as balance.   She has followed up with neurology and cardiology and a Holter monitor has been placed. She has not been advised of any abnormal rhythms thus far.   Her BP in office is 96/61. She has not been checking her BP at home. She has been alternating between the high and low doses of her BP medication as I had advised last visit.   Pain Inventory Average Pain 6 Pain Right Now 5 My pain is burning, tingling and aching  In the last 24 hours, has pain interfered with the following? General activity 3 Relation with others 2 Enjoyment of life 4 What TIME of day is your pain at its worst? daytime, evening and night Sleep (in general) Fair  Pain is worse with: walking, sitting, inactivity and standing Pain improves with: medication Relief from Meds: 5  Family History  Problem Relation Age of Onset  . Lung cancer Father   . Hypertension Father   . Thyroid cancer Father        dx in his 39s  . Cancer Father        lung, thyroid, smoker  . Breast  cancer Paternal Aunt 36  . Colon cancer Paternal Aunt        dx in her 50x  . Cervical cancer Paternal Aunt        dzx in her 51s  . Ovarian cancer Cousin        dx in her lage 33s  . Breast cancer Cousin        maternal first cousin, once removed; dx in her late 27s  . Breast cancer Cousin        maternal first cousin once removed; dx in late 31s  . Hypertension Mother   . Diabetes Mother   . Dementia Mother   . Memory loss Mother   . Hypertension Brother   . Seizures Brother        Alcohol induced.  . Alcohol abuse Brother        drinker, smoker  . Cancer Paternal Uncle        oral cancer  . Kidney cancer Paternal Grandmother   . Arthritis Daughter        back surgery  . Cancer Cousin        several paternal cousins with brain cancer, leukemia, and other cancers  . Cancer Sister  stomach   Social History   Socioeconomic History  . Marital status: Married    Spouse name: [name]  . Number of children: 1  . Years of education: 7  . Highest education level: Not on file  Occupational History  . Occupation: INVENTORY Hotel manager: [employer]  . Occupation: disability no longer working 2015  Tobacco Use  . Smoking status: Never Smoker  . Smokeless tobacco: Never Used  Vaping Use  . Vaping Use: Never used  Substance and Sexual Activity  . Alcohol use: No  . Drug use: No  . Sexual activity: Yes    Comment: lives with husband, disability/retirement. [employer] devices, no dietary restrictions  Other Topics Concern  . Not on file  Social History Narrative   Patient is married [name]) and lives at home with her husband.   Patient has one daughter   Patient drinks very little caffeine.   Left handed   Social Determinants of Health   Financial Resource Strain: Low Risk   . Difficulty of Paying Living Expenses: Not hard at all  Food Insecurity: No Food Insecurity  . Worried About Charity fundraiser in the Last Year: Never true  . Ran  Out of Food in the Last Year: Never true  Transportation Needs: No Transportation Needs  . Lack of Transportation (Medical): No  . Lack of Transportation (Non-Medical): No  Physical Activity:   . Days of Exercise per Week: Not on file  . Minutes of Exercise per Session: Not on file  Stress:   . Feeling of Stress : Not on file  Social Connections:   . Frequency of Communication with Friends and Family: Not on file  . Frequency of Social Gatherings with Friends and Family: Not on file  . Attends Religious Services: Not on file  . Active Member of Clubs or Organizations: Not on file  . Attends Archivist Meetings: Not on file  . Marital Status: Not on file   Past Surgical History:  Procedure Laterality Date  . ANAL SPHINCTEROTOMY  04/2011  . APPENDECTOMY  1980  . AXILLARY LYMPH NODE DISSECTION Left 02/04/2013   Procedure: LEFT AXILLARY LYMPH NODE DISSECTION;  Surgeon: Stark Klein, MD;  Location: [place];  Service: General;  Laterality: Left;  End: 7829  . BREAST LUMPECTOMY WITH NEEDLE LOCALIZATION Left 02/04/2013   Procedure: LEFT BREAST LUMPECTOMY WITH NEEDLE LOCALIZATION;  Surgeon: Stark Klein, MD;  Location: [place];  Service: General;  Laterality: Left;  . BREAST SURGERY     Lumpectomy in april 2014  . HEMORRHOID SURGERY  04/2011   ligation  . MASTECTOMY Left 02/15/2017  . PORT-A-CATH REMOVAL N/A 04/16/2014   Procedure: REMOVAL PORT-A-CATH;  Surgeon: Stark Klein, MD;  [facility];  Service: General;  Laterality: N/A;  . PORTACATH PLACEMENT Right 02/04/2013   Procedure: INSERTION PORT-A-CATH;  Surgeon: Stark Klein, MD;  Location: [place];  Service: General;  Laterality: Right;  Start Time: 5621.  Marland Kitchen SHOULDER ARTHROSCOPY WITH ROTATOR CUFF REPAIR AND SUBACROMIAL DECOMPRESSION Left 02/24/2014   Procedure: SHOULDER ARTHROSCOPY WITH ROTATOR CUFF REPAIR AND SUBACROMIAL DECOMPRESSION;  Surgeon: Meredith Pel, MD;  Location: [place];  Service: Orthopedics;  Laterality: Left;  LEFT  SHOULDER DIAGNOSTIC OPERATIVE ARTHROSCOPY, SUBACROMIAL DECOMPRESSION, ROTATOR CUFF TEAR REPAIR  . SIMPLE MASTECTOMY WITH AXILLARY SENTINEL NODE BIOPSY Left 02/15/2017   Procedure: LEFT MASTECTOMY;  Surgeon: Stark Klein, MD;  Location: [place];  Service: General;  Laterality: Left;  .  TOTAL MASTECTOMY Right 12/26/2018   Procedure: RIGHT BREAST PROPHYLATIC MASTECTOMY;  Surgeon: Stark Klein, MD;  Location: [place];  Service: General;  Laterality: Right;   Past Surgical History:  Procedure Laterality Date  . ANAL SPHINCTEROTOMY  04/2011  . APPENDECTOMY  1980  . AXILLARY LYMPH NODE DISSECTION Left 02/04/2013   Procedure: LEFT AXILLARY LYMPH NODE DISSECTION;  Surgeon: Stark Klein, MD;  Location: [place];  Service: General;  Laterality: Left;  End: 1497  . BREAST LUMPECTOMY WITH NEEDLE LOCALIZATION Left 02/04/2013   Procedure: LEFT BREAST LUMPECTOMY WITH NEEDLE LOCALIZATION;  Surgeon: Stark Klein, MD;  Location: [place];  Service: General;  Laterality: Left;  . BREAST SURGERY     Lumpectomy in april 2014  . HEMORRHOID SURGERY  04/2011   ligation  . MASTECTOMY Left 02/15/2017  . PORT-A-CATH REMOVAL N/A 04/16/2014   Procedure: REMOVAL PORT-A-CATH;  Surgeon: Stark Klein, MD;  [facility];  Service: General;  Laterality: N/A;  . PORTACATH PLACEMENT Right 02/04/2013   Procedure: INSERTION PORT-A-CATH;  Surgeon: Stark Klein, MD;  Location: [place];  Service: General;  Laterality: Right;  Start Time: 0263.  Marland Kitchen SHOULDER ARTHROSCOPY WITH ROTATOR CUFF REPAIR AND SUBACROMIAL DECOMPRESSION Left 02/24/2014   Procedure: SHOULDER ARTHROSCOPY WITH ROTATOR CUFF REPAIR AND SUBACROMIAL DECOMPRESSION;  Surgeon: Meredith Pel, MD;  Location: [place];  Service: Orthopedics;  Laterality: Left;  LEFT SHOULDER DIAGNOSTIC OPERATIVE ARTHROSCOPY, SUBACROMIAL DECOMPRESSION, ROTATOR CUFF TEAR REPAIR  . SIMPLE MASTECTOMY WITH AXILLARY SENTINEL NODE BIOPSY Left 02/15/2017   Procedure: LEFT MASTECTOMY;  Surgeon: Stark Klein, MD;   Location: [place];  Service: General;  Laterality: Left;  . TOTAL MASTECTOMY Right 12/26/2018   Procedure: RIGHT BREAST PROPHYLATIC MASTECTOMY;  Surgeon: Stark Klein, MD;  Location: [place];  Service: General;  Laterality: Right;   Past Medical History:  Diagnosis Date  . Anemia    Iron deficinecy anemia  . Anemia 05/14/2017  . Anxiety   . Anxiety and depression 05/15/2014  . Arthritis   . Breast cancer ([place])    left ,last radiation 2'15, last chemo 8'14  . Constipation 11/13/2016  . Depression   . History of chicken pox   . History of radiation therapy 09/09/13-10/28/13   45 gray to left breast, lumpectomy cavity boosted to 63 gray  . HTN (hypertension) 11/13/2016  . Hyperglycemia 01/09/2016  . Hyperlipidemia 05/14/2017  . Hypertension   . MRSA (methicillin resistant Staphylococcus aureus) 2009   right groin area-no issues now. 04-07-14 PCR screen negative today.  . Neuropathy   . Preventative health care 11/13/2016  . Stroke ([place]) 06/2020   LMP 01/20/2013   Opioid Risk Score:   Fall Risk Score:  `1  Depression screen PHQ 2/9  Depression screen [facility] 2/9 06/30/2020 05/26/2020 05/05/2020 05/02/2019 04/30/2018 10/12/2016  Decreased Interest 1 0 2 0 0 0  Down, Depressed, Hopeless 1 0 1 0 1 0  PHQ - 2 Score 2 0 3 0 1 0  Altered sleeping - - 2 - - -  Tired, decreased energy - - 3 - - -  Change in appetite - - 2 - - -  Feeling bad or failure about yourself  - - 1 - - -  Trouble concentrating - - 1 - - -  Moving slowly or fidgety/restless - - 1 - - -  Suicidal thoughts - - 0 - - -  PHQ-9 Score - - 13 - - -  Some encounter information is confidential and restricted. Go to Review Flowsheets  activity to see all data.  Some recent data might be hidden   Review of Systems  Musculoskeletal:       Hand and feet pain  All other systems reviewed and are negative.      Objective:   Physical Exam Gen: no distress, normal appearing HEENT: oral mucosa pink and moist, NCAT Cardio: Reg rate Chest:  normal effort, normal rate of breathing Abd: soft, non-distended Ext: no edema Skin: intact Neuro: Alert and oriented x3. Able to walk in and out of the office well.  Musculoskeletal: 5/5 strength in bilateral lower extremities and and upper extremities. Sensation intact throughout.  Psych: pleasant, normal affect    Assessment & Plan:  Kristi [name] is a 64 year old woman who presents with bilateral foot painful neuropathy since starting Anastrazole chemotherapy for breast cancer in 2015. She was also recently admitted to [facility] for left cerebellar artery stroke.   -Reviewed CIR notes.   -Continue Gabapentin 600/600/800mg . She does not require refill.   -Pain contract signed previously.   -UDS obtained previously. Personally reviewed and contains expected metabolites.   -PDMP reviewed. Norco 10mg  up to 3 times daily refilled. MME 30. Prescription is not being eprescribed to [facility]- called patient to inform her and ask if there is a different pharmacy we can send it to.  -Provided referral to [facility] for evaluation for lumbar sympathetic nerve block.   Hypotension: BP is better controlled today. Advised that she log pressures at home and bring to our follow-up appointment. Continue only with half-dose of Valsartan-HCTZ daily. Normalizing BP has helped with dizziness. Explained mechanism for orthostatic hypotension.   General health: Educated regarding low carb/sugar diet and discussed her current diet. Advise regarding anti-inflammatory foods. Commended 35 minute walk daily! Advised resistive exercises daily.   All questions answered. RTC in 4 weeks.

## 2020-10-07 ENCOUNTER — Other Ambulatory Visit: Payer: Self-pay

## 2020-10-07 MED ORDER — HYDROCODONE-ACETAMINOPHEN 10-325 MG PO TABS: 1.0000 | ORAL_TABLET | Freq: Three times a day (TID) | ORAL | 0 refills | Status: DC | PRN

## 2020-10-08 ENCOUNTER — Telehealth: Payer: Self-pay | Admitting: *Deleted

## 2020-10-08 ENCOUNTER — Other Ambulatory Visit: Payer: Self-pay | Admitting: Physical Medicine and Rehabilitation

## 2020-10-08 MED ORDER — HYDROCODONE-ACETAMINOPHEN 10-325 MG PO TABS
1.0000 | ORAL_TABLET | Freq: Three times a day (TID) | ORAL | 0 refills | Status: DC | PRN
Start: 2020-10-08 — End: 2020-11-10

## 2020-10-08 NOTE — Telephone Encounter (Signed)
Notified. 

## 2020-10-08 NOTE — Telephone Encounter (Signed)
I have put in new order

## 2020-10-08 NOTE — Telephone Encounter (Signed)
Kristi [name] called about her hydrocodone.  The pharmacy did not have the Rx and it looks like the Rx was on print and not electronic Rx. Can you send again?

## 2020-10-14 ENCOUNTER — Other Ambulatory Visit: Payer: Self-pay

## 2020-10-14 DIAGNOSIS — I10 Essential (primary) hypertension: Secondary | ICD-10-CM | POA: Insufficient documentation

## 2020-10-14 DIAGNOSIS — F32A Depression, unspecified: Secondary | ICD-10-CM | POA: Insufficient documentation

## 2020-10-14 DIAGNOSIS — G629 Polyneuropathy, unspecified: Secondary | ICD-10-CM | POA: Insufficient documentation

## 2020-10-14 DIAGNOSIS — C50919 Malignant neoplasm of unspecified site of unspecified female breast: Secondary | ICD-10-CM | POA: Insufficient documentation

## 2020-10-14 DIAGNOSIS — F419 Anxiety disorder, unspecified: Secondary | ICD-10-CM | POA: Insufficient documentation

## 2020-10-14 DIAGNOSIS — Z923 Personal history of irradiation: Secondary | ICD-10-CM | POA: Insufficient documentation

## 2020-10-14 DIAGNOSIS — M1990 Unspecified osteoarthritis, unspecified site: Secondary | ICD-10-CM | POA: Insufficient documentation

## 2020-10-15 ENCOUNTER — Encounter: Payer: Self-pay | Admitting: Cardiology

## 2020-10-15 ENCOUNTER — Ambulatory Visit: Payer: Medicare Other | Admitting: Cardiology

## 2020-10-15 ENCOUNTER — Other Ambulatory Visit: Payer: Self-pay

## 2020-10-15 VITALS — BP 108/60 | HR 72 | Resp 16 | Ht 62.0 in | Wt 149.1 lb

## 2020-10-15 DIAGNOSIS — R002 Palpitations: Secondary | ICD-10-CM | POA: Diagnosis not present

## 2020-10-15 DIAGNOSIS — I10 Essential (primary) hypertension: Secondary | ICD-10-CM

## 2020-10-15 DIAGNOSIS — Z8673 Personal history of transient ischemic attack (TIA), and cerebral infarction without residual deficits: Secondary | ICD-10-CM

## 2020-10-15 DIAGNOSIS — E782 Mixed hyperlipidemia: Secondary | ICD-10-CM | POA: Diagnosis not present

## 2020-10-15 NOTE — Progress Notes (Signed)
Cardiology Office Note:    Date:  10/15/2020   ID:  Kristi [name], DOB 1956-06-18, MRN [id number]  PCP:  Mosie Lukes, MD  Cardiologist:  Jenean Lindau, MD   Referring MD: Mosie Lukes, MD    ASSESSMENT:    1. Primary hypertension   2. Palpitations   3. H/O: CVA (cerebrovascular accident)   4. Mixed dyslipidemia    PLAN:    In order of problems listed above:  1. Primary prevention stressed with the patient. Importance of compliance with diet medication stressed. Vocalized understanding. 2. Essential hypertension: Blood pressure stable and diet was emphasized. 3. Mixed dyslipidemia: I reviewed lipids. On statin therapy and managing diet and exercise well. 4. History of stroke: Stable at this time medical management. 5. Palpitations: She had monitoring done. I am awaiting those reports. 6. Patient will be seen in follow-up appointment in 6 months or earlier if the patient has any concerns    Medication Adjustments/Labs and Tests Ordered: Current medicines are reviewed at length with the patient today.  Concerns regarding medicines are outlined above.  No orders of the defined types were placed in this encounter.  No orders of the defined types were placed in this encounter.    No chief complaint on file.    History of Present Illness:    Kristi [name] is a 64 y.o. female. Patient has past medical history of essential hypertension history of stroke dyslipidemia and palpitations. She denies any problems at this time and takes care of activities of daily living. She walks on a regular basis. Her palpitations have resolved. At the time of my evaluation, the patient is alert awake oriented and in no distress.  Past Medical History:  Diagnosis Date  . Acute blood loss anemia   . Anal fissure 05/03/2011  . Anemia    Iron deficinecy anemia  . Anemia 05/14/2017  . Anorectal pain 04/12/2013  . Anxiety   . Anxiety and depression 05/15/2014  . Anxiety state 05/15/2014   . Arthritis   . Breast cancer ([place])    left ,last radiation 2'15, last chemo 8'14  . Cervical cancer screening 06/18/2018   Menarche at 11 Regular and moderate flow  history of abnormal pap in past, 1 mild abnormality years ago that resolved spontaneously on recheck G1P1, s/p 1 svd history of abnormal MGM, b/o breast cancer 2014 No concerns today  gyn surgeries. Lumpectomy  LMP around early 2014  . Constipation 11/13/2016  . Contracture of axilla 05/20/2013  . Debility 01/18/2020  . Depression   . Flushing 02/20/2014  . Genetic testing 02/20/2019   Negative genetic testing on the Comprehensive Cancer Panel.  The Comprehensive Common Cancer Panel offered by [facility] includes sequencing and/or deletion duplication testing of the following 46 genes: APC, ATM, AXIN2, BAP1, BARD1, BMPR1A, BRCA1, BRCA2, BRIP1, CDH1, CDK4, CDKN2A, CHEK2, EPCAM, FANCC, FH, FLCN, HOXB13, MET, MITF,  MLH1, MSH2, MSH6, MUTYH, NBN, NF1, NTHL1,  PALB2, PMS2, POLD1, POLE, P  . H/O: CVA (cerebrovascular accident) 06/19/2020  . Hereditary and idiopathic peripheral neuropathy 11/11/2013  . Herpes zoster 05/15/2014  . History of chicken pox   . History of left breast cancer 02/15/2017  . History of radiation therapy 09/09/13-10/28/13   45 gray to left breast, lumpectomy cavity boosted to 63 gray  . HTN (hypertension) 11/13/2016  . Hyperglycemia 01/09/2016  . Hyperlipidemia 05/14/2017  . Hypertension   . Internal hemorrhoids with complication 2/77/8242  . Knee pain, bilateral 11/14/2016  . Left  upper extremity numbness 06/19/2018  . Major depressive disorder, recurrent episode, severe ([place]) 01/12/2014  . Malignant neoplasm of upper-inner quadrant of female breast ([place]) 01/24/2013    Left IDC, ER 3%, PR-, Her2neu-  Formatting of this note might be different from the original. Overview:   Left IDC, ER 3%, PR-, Her2neu-  Last Assessment & Plan:  Pt will continue to take her tamoxifen as previously directed. She appears to be tolerating this with only  c/o moderate, occ hot flashes.  . Mild cognitive impairment 08/24/2019  . Mild neurocognitive disorder due to multiple etiologies 03/17/2020  . MRSA (methicillin resistant Staphylococcus aureus) 2009   right groin area-no issues now. 04-07-14 PCR screen negative today.  . Neuropathy   . Neuropathy of hand, left 09/21/2015  . Nodule of finger of left hand 11/14/2016  . Palpitations 07/30/2020  . Preventative health care 11/13/2016  . Right hip pain 11/14/2016  . S/P mastectomy, right 12/26/2018  . Sebaceous cyst 04/12/2013  . Shoulder joint pain 12/12/2013  . Strain of rotator cuff 02/24/2014  . Stroke ([place]) 06/2020  . Vertigo     Past Surgical History:  Procedure Laterality Date  . ANAL SPHINCTEROTOMY  04/2011  . APPENDECTOMY  1980  . AXILLARY LYMPH NODE DISSECTION Left 02/04/2013   Procedure: LEFT AXILLARY LYMPH NODE DISSECTION;  Surgeon: Stark Klein, MD;  Location: [place];  Service: General;  Laterality: Left;  End: 4166  . BREAST LUMPECTOMY WITH NEEDLE LOCALIZATION Left 02/04/2013   Procedure: LEFT BREAST LUMPECTOMY WITH NEEDLE LOCALIZATION;  Surgeon: Stark Klein, MD;  Location: [place];  Service: General;  Laterality: Left;  . BREAST SURGERY     Lumpectomy in april 2014  . HEMORRHOID SURGERY  04/2011   ligation  . MASTECTOMY Left 02/15/2017  . PORT-A-CATH REMOVAL N/A 04/16/2014   Procedure: REMOVAL PORT-A-CATH;  Surgeon: Stark Klein, MD;  [facility];  Service: General;  Laterality: N/A;  . PORTACATH PLACEMENT Right 02/04/2013   Procedure: INSERTION PORT-A-CATH;  Surgeon: Stark Klein, MD;  Location: [place];  Service: General;  Laterality: Right;  Start Time: 0630.  Marland Kitchen SHOULDER ARTHROSCOPY WITH ROTATOR CUFF REPAIR AND SUBACROMIAL DECOMPRESSION Left 02/24/2014   Procedure: SHOULDER ARTHROSCOPY WITH ROTATOR CUFF REPAIR AND SUBACROMIAL DECOMPRESSION;  Surgeon: Meredith Pel, MD;  Location: [place];  Service: Orthopedics;  Laterality: Left;  LEFT SHOULDER DIAGNOSTIC OPERATIVE ARTHROSCOPY,  SUBACROMIAL DECOMPRESSION, ROTATOR CUFF TEAR REPAIR  . SIMPLE MASTECTOMY WITH AXILLARY SENTINEL NODE BIOPSY Left 02/15/2017   Procedure: LEFT MASTECTOMY;  Surgeon: Stark Klein, MD;  Location: [place];  Service: General;  Laterality: Left;  . TOTAL MASTECTOMY Right 12/26/2018   Procedure: RIGHT BREAST PROPHYLATIC MASTECTOMY;  Surgeon: Stark Klein, MD;  Location: [place];  Service: General;  Laterality: Right;    Current Medications: Current Meds  Medication Sig  . anastrozole (ARIMIDEX) 1 MG tablet Take 1 tablet (1 mg total) by mouth daily. TAKE 1 TABLET (1 MG) BY MOUTH DAILY IN THE MORNING  . Apoaequorin (PREVAGEN) 10 MG CAPS Take 1 capsule by mouth daily.  Marland Kitchen aspirin EC 325 MG EC tablet Take 1 tablet (325 mg total) by mouth daily.  Marland Kitchen gabapentin (NEURONTIN) 300 MG capsule Take 300 mg by mouth.  Marland Kitchen HYDROcodone-acetaminophen (NORCO) 10-325 MG tablet Take 1 tablet by mouth 3 (three) times daily as needed.  . hydrOXYzine (ATARAX/VISTARIL) 50 MG tablet Take 1 tablet (50 mg total) by mouth 2 (two) times daily.  Marland Kitchen LATUDA 80 MG TABS tablet Take 0.5 tablets (40  mg total) by mouth at bedtime.  . Magnesium 250 MG TABS Take 1 tablet (250 mg total) by mouth daily.  . Multiple Vitamin (MULTIVITAMIN WITH MINERALS) TABS tablet Take 1 tablet by mouth daily.  . polyethylene glycol (MIRALAX / GLYCOLAX) 17 g packet Take 17 g by mouth daily as needed for mild constipation.  . traZODone (DESYREL) 50 MG tablet Take 1 tablet (50 mg total) by mouth at bedtime.  . valsartan-hydrochlorothiazide (DIOVAN-HCT) 160-12.5 MG tablet TAKE 1 TABLET BY MOUTH EVERY DAY     Allergies:   Patient has no known allergies.   Social History   Socioeconomic History  . Marital status: Married    Spouse name: [name]  . Number of children: 1  . Years of education: 56  . Highest education level: Not on file  Occupational History  . 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 Occupation: disability no  longer working 2015  Tobacco Use  . Smoking status: Never Smoker  . Smokeless tobacco: Never Used  Vaping Use  . Vaping Use: Never used  Substance and Sexual Activity  . Alcohol use: No  . Drug use: No  . Sexual activity: Yes    Comment: lives with husband, disability/retirement. [employer] devices, no dietary restrictions  Other Topics Concern  . Not on file  Social History Narrative   Patient is married [name]) and lives at home with her husband.   Patient has one daughter   Patient drinks very little caffeine.   Left handed   Social Determinants of Health   Financial Resource Strain: Low Risk   . Difficulty of Paying Living Expenses: Not hard at all  Food Insecurity: No Food Insecurity  . Worried About Charity fundraiser in the Last Year: Never true  . Ran Out of Food in the Last Year: Never true  Transportation Needs: No Transportation Needs  . Lack of Transportation (Medical): No  . Lack of Transportation (Non-Medical): No  Physical Activity: Not on file  Stress: Not on file  Social Connections: Not on file     Family History: The patient's family history includes Alcohol abuse in her brother; Arthritis in her daughter; Breast cancer in her cousin and cousin; Breast cancer (age of onset: 62) in her paternal aunt; Cancer in her cousin, father, paternal uncle, and sister; Cervical cancer in her paternal aunt; Colon cancer in her paternal aunt; Dementia in her mother; Diabetes in her mother; Hypertension in her brother, father, and mother; Kidney cancer in her paternal grandmother; Lung cancer in her father; Memory loss in her mother; Ovarian cancer in her cousin; Seizures in her brother; Thyroid cancer in her father.  ROS:   Please see the history of present illness.    All other systems reviewed and are negative.  EKGs/Labs/Other Studies Reviewed:    The following studies were reviewed today: IMPRESSIONS    1. Left ventricular ejection fraction, by estimation, is  60 to 65%. The  left ventricle has normal function. The left ventricle has no regional  wall motion abnormalities. Left ventricular diastolic parameters are  consistent with Grade I diastolic  dysfunction (impaired relaxation).  2. Right ventricular systolic function is normal. The right ventricular  size is normal. Tricuspid regurgitation signal is inadequate for assessing  PA pressure.  3. The mitral valve is normal in structure. No evidence of mitral valve  regurgitation. No evidence of mitral stenosis.  4. The aortic valve is tricuspid. Aortic valve  regurgitation is not  visualized. No aortic stenosis is present.  5. The inferior vena cava is normal in size with greater than 50%  respiratory variability, suggesting right atrial pressure of 3 mmHg.    Recent Labs: 01/21/2020: TSH 1.32 06/28/2020: ALT 13; BUN 13; Creat 1.27; Hemoglobin 11.0; Platelets 208; Potassium 4.7; Sodium 140  Recent Lipid Panel    Component Value Date/Time   CHOL 144 06/16/2020 0801   TRIG 68 06/16/2020 0801   HDL 41 06/16/2020 0801   CHOLHDL 3.5 06/16/2020 0801   VLDL 14 06/16/2020 0801   LDLCALC 89 06/16/2020 0801    Physical Exam:    VS:  BP 108/60 (BP Location: Right Arm, Patient Position: Sitting, Cuff Size: Normal)   Pulse 72   Resp 16   Ht 5' 2"  (1.575 m)   Wt 149 lb 1.9 oz (67.6 kg)   LMP 01/20/2013   SpO2 100%   BMI 27.27 kg/m     Wt Readings from Last 3 Encounters:  10/15/20 149 lb 1.9 oz (67.6 kg)  10/06/20 149 lb 6.4 oz (67.8 kg)  09/10/20 149 lb (67.6 kg)     GEN: Patient is in no acute distress HEENT: Normal NECK: No JVD; No carotid bruits LYMPHATICS: No lymphadenopathy CARDIAC: Hear sounds regular, 2/6 systolic murmur at the apex. RESPIRATORY:  Clear to auscultation without rales, wheezing or rhonchi  ABDOMEN: Soft, non-tender, non-distended MUSCULOSKELETAL:  No edema; No deformity  SKIN: Warm and dry NEUROLOGIC:  Alert and oriented x 3 PSYCHIATRIC:  Normal affect    Signed, Jenean Lindau, MD  10/15/2020 2:36 PM    [facility]

## 2020-10-15 NOTE — Patient Instructions (Signed)

## 2020-11-10 ENCOUNTER — Other Ambulatory Visit: Payer: Self-pay

## 2020-11-10 ENCOUNTER — Encounter: Payer: Self-pay | Admitting: Physical Medicine and Rehabilitation

## 2020-11-10 ENCOUNTER — Encounter
Payer: Medicare Other | Attending: Physical Medicine and Rehabilitation | Admitting: Physical Medicine and Rehabilitation

## 2020-11-10 VITALS — BP 109/62 | HR 58 | Temp 98.5°F | Ht 62.0 in | Wt 150.6 lb

## 2020-11-10 DIAGNOSIS — T451X5A Adverse effect of antineoplastic and immunosuppressive drugs, initial encounter: Secondary | ICD-10-CM | POA: Diagnosis not present

## 2020-11-10 DIAGNOSIS — I63542 Cerebral infarction due to unspecified occlusion or stenosis of left cerebellar artery: Secondary | ICD-10-CM | POA: Diagnosis not present

## 2020-11-10 DIAGNOSIS — Z5181 Encounter for therapeutic drug level monitoring: Secondary | ICD-10-CM | POA: Diagnosis not present

## 2020-11-10 DIAGNOSIS — Z79891 Long term (current) use of opiate analgesic: Secondary | ICD-10-CM | POA: Diagnosis not present

## 2020-11-10 DIAGNOSIS — Z853 Personal history of malignant neoplasm of breast: Secondary | ICD-10-CM | POA: Insufficient documentation

## 2020-11-10 DIAGNOSIS — G620 Drug-induced polyneuropathy: Secondary | ICD-10-CM | POA: Insufficient documentation

## 2020-11-10 DIAGNOSIS — G894 Chronic pain syndrome: Secondary | ICD-10-CM | POA: Diagnosis not present

## 2020-11-10 MED ORDER — GABAPENTIN 600 MG PO TABS: 600.0000 mg | ORAL_TABLET | Freq: Three times a day (TID) | ORAL | 1 refills | Status: DC

## 2020-11-10 MED ORDER — HYDROCODONE-ACETAMINOPHEN 10-325 MG PO TABS: 1.0000 | ORAL_TABLET | Freq: Three times a day (TID) | ORAL | 0 refills | Status: DC | PRN

## 2020-11-10 NOTE — Progress Notes (Signed)
Subjective:    Patient ID: Kristi [name], female    DOB: [birth year], 65 y.o.   MRN: [id number]  HPI [name] is a 65 year old female who presents for follow-up of cerebral artery stroke and peripheral neuropathy  1) History of left breast cancer: She has been taking a pill of turmeric daily. She does use sugar at times- educated regarding benefits of ketogenic diet to keep cancer in remission  2) Chemotherapy induced peripheral neuropathy: Her pain has been stable. She continues to take Norco up to three times per day. If she skips her middle dose she does note increased pain. She also continues on Gabapentin 600/600/900.   3) General health: She has been trying to do some resistance training every day. Since last visit she has increased her daily walk to 40 minutes despite the cold! She usually does this around 9-10am.  4) Her BP has been much better controlled. Soft in office today and it is usually this low on her home reads.   She has no other complaints.   Prior history: Pain is worse when she is walking. It is worse in her left hand and bilateral legs. She has been taking three of the Norco per day and 600/600/900 at night. Currently pain is 4/10, improved 1 point from last visit. It continues to be constant.   Her PT wanted her to go outside and walk. She is able to walk unassisted. Her goal is to get to 10 minutes. She has been doing strengthening exercises for both upper body as well as balance.   She has followed up with neurology and cardiology and a Holter monitor has been placed. She has not been advised of any abnormal rhythms thus far.   Her BP in office is 96/61. She has not been checking her BP at home. She has been alternating between the high and low doses of her BP medication as I had advised last visit.   Pain Inventory Average Pain 5 Pain Right Now 4 My pain is constant, sharp, tingling and aching  In the last 24 hours, has pain interfered with the  following? General activity 1 Relation with others 1 Enjoyment of life 1 What TIME of day is your pain at its worst? morning , daytime, evening and night Sleep (in general) Fair  Pain is worse with: walking, sitting and standing Pain improves with: medication Relief from Meds: 5  Family History  Problem Relation Age of Onset  . Lung cancer Father   . Hypertension Father   . Thyroid cancer Father        dx in his 64s  . Cancer Father        lung, thyroid, smoker  . Breast cancer Paternal Aunt 80  . Colon cancer Paternal Aunt        dx in her 50x  . Cervical cancer Paternal Aunt        dzx in her 46s  . Ovarian cancer Cousin        dx in her lage 75s  . Breast cancer Cousin        maternal first cousin, once removed; dx in her late 35s  . Breast cancer Cousin        maternal first cousin once removed; dx in late 19s  . Hypertension Mother   . Diabetes Mother   . Dementia Mother   . Memory loss Mother   . Hypertension Brother   . Seizures Brother  Alcohol induced.  . Alcohol abuse Brother        drinker, smoker  . Cancer Paternal Uncle        oral cancer  . Kidney cancer Paternal Grandmother   . Arthritis Daughter        back surgery  . Cancer Cousin        several paternal cousins with brain cancer, leukemia, and other cancers  . Cancer Sister        stomach   Social History   Socioeconomic History  . Marital status: Married    Spouse name: [name]  . Number of children: 1  . Years of education: 36  . Highest education level: Not on file  Occupational History  . Occupation: INVENTORY Hotel manager: [employer]  . Occupation: disability no longer working 2015  Tobacco Use  . Smoking status: Never Smoker  . Smokeless tobacco: Never Used  Vaping Use  . Vaping Use: Never used  Substance and Sexual Activity  . Alcohol use: No  . Drug use: No  . Sexual activity: Yes    Comment: lives with husband, disability/retirement. [employer]  [employer], no dietary restrictions  Other Topics Concern  . Not on file  Social History Narrative   Patient is married [name]) and lives at home with her husband.   Patient has one daughter   Patient drinks very little caffeine.   Left handed   Social Determinants of Health   Financial Resource Strain: Low Risk   . Difficulty of Paying Living Expenses: Not hard at all  Food Insecurity: No Food Insecurity  . Worried About Charity fundraiser in the Last Year: Never true  . Ran Out of Food in the Last Year: Never true  Transportation Needs: No Transportation Needs  . Lack of Transportation (Medical): No  . Lack of Transportation (Non-Medical): No  Physical Activity: Not on file  Stress: Not on file  Social Connections: Not on file   Past Surgical History:  Procedure Laterality Date  . ANAL SPHINCTEROTOMY  04/2011  . APPENDECTOMY  1980  . AXILLARY LYMPH NODE DISSECTION Left 02/04/2013   Procedure: LEFT AXILLARY LYMPH NODE DISSECTION;  Surgeon: Stark Klein, MD;  Location: [facility];  Service: General;  Laterality: Left;  End: 2023  . BREAST LUMPECTOMY WITH NEEDLE LOCALIZATION Left 02/04/2013   Procedure: LEFT BREAST LUMPECTOMY WITH NEEDLE LOCALIZATION;  Surgeon: Stark Klein, MD;  Location: [facility];  Service: General;  Laterality: Left;  . BREAST SURGERY     Lumpectomy in april 2014  . HEMORRHOID SURGERY  04/2011   ligation  . MASTECTOMY Left 02/15/2017  . PORT-A-CATH REMOVAL N/A 04/16/2014   Procedure: REMOVAL PORT-A-CATH;  Surgeon: Stark Klein, MD;  Location: [facility];  Service: General;  Laterality: N/A;  . PORTACATH PLACEMENT Right 02/04/2013   Procedure: INSERTION PORT-A-CATH;  Surgeon: Stark Klein, MD;  Location: [facility];  Service: General;  Laterality: Right;  Start Time: 3435.  Marland Kitchen SHOULDER ARTHROSCOPY WITH ROTATOR CUFF REPAIR AND SUBACROMIAL DECOMPRESSION Left 02/24/2014   Procedure: SHOULDER ARTHROSCOPY WITH ROTATOR CUFF REPAIR AND SUBACROMIAL DECOMPRESSION;  Surgeon: Meredith Pel,  MD;  Location: [facility];  Service: Orthopedics;  Laterality: Left;  LEFT SHOULDER DIAGNOSTIC OPERATIVE ARTHROSCOPY, SUBACROMIAL DECOMPRESSION, ROTATOR CUFF TEAR REPAIR  . SIMPLE MASTECTOMY WITH AXILLARY SENTINEL NODE BIOPSY Left 02/15/2017   Procedure: LEFT MASTECTOMY;  Surgeon: Stark Klein, MD;  Location: [facility];  Service: General;  Laterality: Left;  . TOTAL  MASTECTOMY Right 12/26/2018   Procedure: RIGHT BREAST PROPHYLATIC MASTECTOMY;  Surgeon: Stark Klein, MD;  Location: [place] [facility];  Service: General;  Laterality: Right;   Past Surgical History:  Procedure Laterality Date  . ANAL SPHINCTEROTOMY  04/2011  . APPENDECTOMY  1980  . AXILLARY LYMPH NODE DISSECTION Left 02/04/2013   Procedure: LEFT AXILLARY LYMPH NODE DISSECTION;  Surgeon: Stark Klein, MD;  Location: [facility];  Service: General;  Laterality: Left;  End: 2951  . BREAST LUMPECTOMY WITH NEEDLE LOCALIZATION Left 02/04/2013   Procedure: LEFT BREAST LUMPECTOMY WITH NEEDLE LOCALIZATION;  Surgeon: Stark Klein, MD;  Location: [facility];  Service: General;  Laterality: Left;  . BREAST SURGERY     Lumpectomy in april 2014  . HEMORRHOID SURGERY  04/2011   ligation  . MASTECTOMY Left 02/15/2017  . PORT-A-CATH REMOVAL N/A 04/16/2014   Procedure: REMOVAL PORT-A-CATH;  Surgeon: Stark Klein, MD;  Location[facility];  Service: General;  Laterality: N/A;  . PORTACATH PLACEMENT Right 02/04/2013   Procedure: INSERTION PORT-A-CATH;  Surgeon: Stark Klein, MD;  Location: [facility];  Service: General;  Laterality: Right;  Start Time: 8841.  Marland Kitchen SHOULDER ARTHROSCOPY WITH ROTATOR CUFF REPAIR AND SUBACROMIAL DECOMPRESSION Left 02/24/2014   Procedure: SHOULDER ARTHROSCOPY WITH ROTATOR CUFF REPAIR AND SUBACROMIAL DECOMPRESSION;  Surgeon: Meredith Pel, MD;  Location: [facility];  Service: Orthopedics;  Laterality: Left;  LEFT SHOULDER DIAGNOSTIC OPERATIVE ARTHROSCOPY, SUBACROMIAL DECOMPRESSION, ROTATOR CUFF TEAR REPAIR  . SIMPLE MASTECTOMY WITH AXILLARY SENTINEL NODE BIOPSY Left  02/15/2017   Procedure: LEFT MASTECTOMY;  Surgeon: Stark Klein, MD;  Location: [facility];  Service: General;  Laterality: Left;  . TOTAL MASTECTOMY Right 12/26/2018   Procedure: RIGHT BREAST PROPHYLATIC MASTECTOMY;  Surgeon: Stark Klein, MD;  Location: [facility];  Service: General;  Laterality: Right;   Past Medical History:  Diagnosis Date  . Acute blood loss anemia   . Anal fissure 05/03/2011  . Anemia    Iron deficinecy anemia  . Anemia 05/14/2017  . Anorectal pain 04/12/2013  . Anxiety   . Anxiety and depression 05/15/2014  . Anxiety state 05/15/2014  . Arthritis   . Breast cancer ([place])    left ,last radiation 2'15, last chemo 8'14  . Cervical cancer screening 06/18/2018   Menarche at 11 Regular and moderate flow  history of abnormal pap in past, 1 mild abnormality years ago that resolved spontaneously on recheck G1P1, s/p 1 svd history of abnormal MGM, b/o breast cancer 2014 No concerns today  gyn surgeries. Lumpectomy  LMP around early 2014  . Constipation 11/13/2016  . Contracture of axilla 05/20/2013  . Debility 01/18/2020  . Depression   . Flushing 02/20/2014  . Genetic testing 02/20/2019   Negative genetic testing on the Comprehensive Cancer Panel.  The Comprehensive Common Cancer Panel offered by [facility] includes sequencing and/or deletion duplication testing of the following 46 genes: APC, ATM, AXIN2, BAP1, BARD1, BMPR1A, BRCA1, BRCA2, BRIP1, CDH1, CDK4, CDKN2A, CHEK2, EPCAM, FANCC, FH, FLCN, HOXB13, MET, MITF,  MLH1, MSH2, MSH6, MUTYH, NBN, NF1, NTHL1,  PALB2, PMS2, POLD1, POLE, P  . H/O: CVA (cerebrovascular accident) 06/19/2020  . Hereditary and idiopathic peripheral neuropathy 11/11/2013  . Herpes zoster 05/15/2014  . History of chicken pox   . History of left breast cancer 02/15/2017  . History of radiation therapy 09/09/13-10/28/13   45 gray to left breast, lumpectomy cavity boosted to 63 gray  . HTN (hypertension) 11/13/2016  . Hyperglycemia 01/09/2016  . Hyperlipidemia 05/14/2017  .  Hypertension   .  Internal hemorrhoids with complication 2/68/3419  . Knee pain, bilateral 11/14/2016  . Left upper extremity numbness 06/19/2018  . Major depressive disorder, recurrent episode, severe ([place]) 01/12/2014  . Malignant neoplasm of upper-inner quadrant of female breast ([place]) 01/24/2013    Left IDC, ER 3%, PR-, Her2neu-  Formatting of this note might be different from the original. Overview:   Left IDC, ER 3%, PR-, Her2neu-  Last Assessment & Plan:  Pt will continue to take her tamoxifen as previously directed. She appears to be tolerating this with only c/o moderate, occ hot flashes.  . Mild cognitive impairment 08/24/2019  . Mild neurocognitive disorder due to multiple etiologies 03/17/2020  . MRSA (methicillin resistant Staphylococcus aureus) 2009   right groin area-no issues now. 04-07-14 PCR screen negative today.  . Neuropathy   . Neuropathy of hand, left 09/21/2015  . Nodule of finger of left hand 11/14/2016  . Palpitations 07/30/2020  . Preventative health care 11/13/2016  . Right hip pain 11/14/2016  . S/P mastectomy, right 12/26/2018  . Sebaceous cyst 04/12/2013  . Shoulder joint pain 12/12/2013  . Strain of rotator cuff 02/24/2014  . Stroke ([place]) 06/2020  . Vertigo    LMP 01/20/2013   Opioid Risk Score:   Fall Risk Score:  `1  Depression screen PHQ 2/9  Depression screen [facility] 2/9 06/30/2020 05/26/2020 05/05/2020 05/02/2019 04/30/2018 10/12/2016  Decreased Interest 1 0 2 0 0 0  Down, Depressed, Hopeless 1 0 1 0 1 0  PHQ - 2 Score 2 0 3 0 1 0  Altered sleeping - - 2 - - -  Tired, decreased energy - - 3 - - -  Change in appetite - - 2 - - -  Feeling bad or failure about yourself  - - 1 - - -  Trouble concentrating - - 1 - - -  Moving slowly or fidgety/restless - - 1 - - -  Suicidal thoughts - - 0 - - -  PHQ-9 Score - - 13 - - -  Some encounter information is confidential and restricted. Go to Review Flowsheets activity to see all data.  Some recent data might be hidden   Review of  Systems  Musculoskeletal:       Hand and feet pain  All other systems reviewed and are negative.      Objective:   Physical Exam Gen: no distress, normal appearing HEENT: oral mucosa pink and moist, NCAT Cardio: Reg rate Chest: normal effort, normal rate of breathing Abd: soft, non-distended Ext: no edema Skin: intact Neuro: Musculoskeletal: Psych: pleasant, normal affect Neuro: Alert and oriented x3. Able to walk in and out of the office well. Normal gait Musculoskeletal: 5/5 strength in bilateral lower extremities and and upper extremities. Sensation intact throughout.  Psych: pleasant, normal affect    Assessment & Plan:  Mrs. Mccrae is a 65 year old woman who presents with bilateral foot painful neuropathy since starting Anastrazole chemotherapy for breast cancer in 2015. She was also admitted to CIR for left cerebellar artery stroke.   1) Chemotherapy induced peripheral neuropathy:  -Continue Gabapentin 600/600/834m. Provided refill today.   -Pain contract signed previously.   -UDS obtained previously. Personally reviewed and contains expected metabolites. Routine UDS today.   -PDMP reviewed. Norco 164mup to 3 times daily refilled today. MME 30. Discussed Nucynta as an alternative option and provided her with a copay card for her to discuss with her pharmacy/insurance company regarding cost.   -[facility] for lumbar sympathetic nerve block.   2) Hypotension: BP is much better controlled today. Advised that she log pressures at home and bring to our follow-up appointment. Continue only with half-dose of Valsartan-HCTZ daily. Normalizing BP has helped with dizziness. Explained mechanism for orthostatic hypotension. Discussed that we can  Go down on BP medication further but she prefers to maintain at this time.   3) General health: Educated regarding low carb/sugar diet and discussed her current diet. Advise  regarding anti-inflammatory foods. Commended 40 minute walk daily! Advised resistive exercises daily.   4) History of breast cancer: recommend ketogenic diet. Continue daily turmeric.   All questions answered. RTC in 4 weeks.

## 2020-11-10 NOTE — Patient Instructions (Signed)
[place] tea 

## 2020-11-16 LAB — TOXASSURE SELECT,+ANTIDEPR,UR

## 2020-11-30 IMAGING — DX DG KNEE COMPLETE 4+V*R*
4 series · 4 of 4 positions shown · non-contrast
Comparison: Radiographs July 12, 2016.

CLINICAL DATA: Knee pain.

EXAM:
RIGHT KNEE - COMPLETE 4+ VIEW

[knee lat]
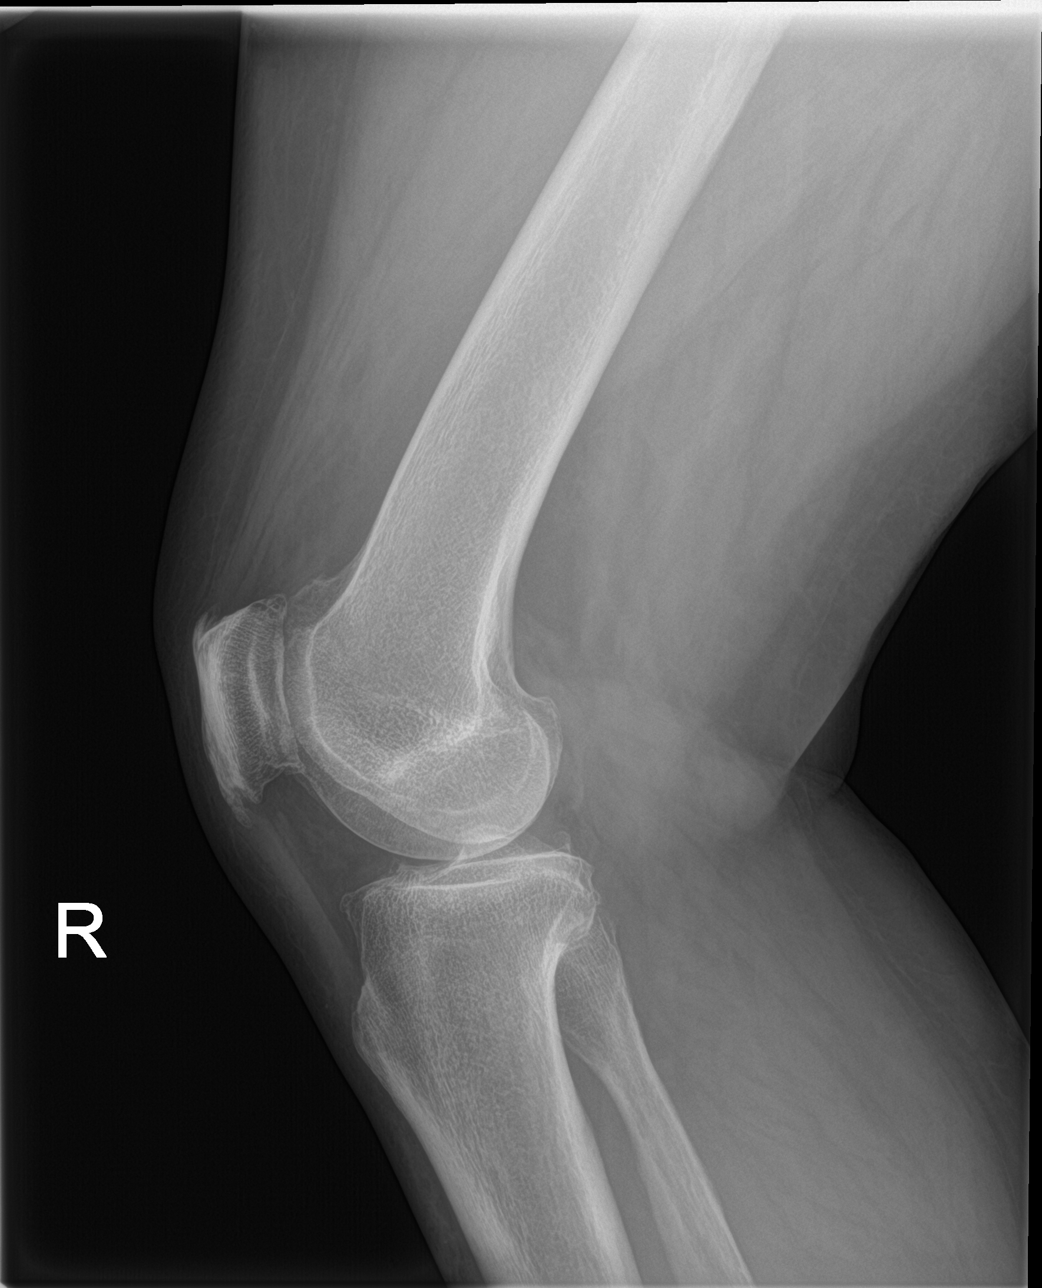

[knee obl (1 of 2)]
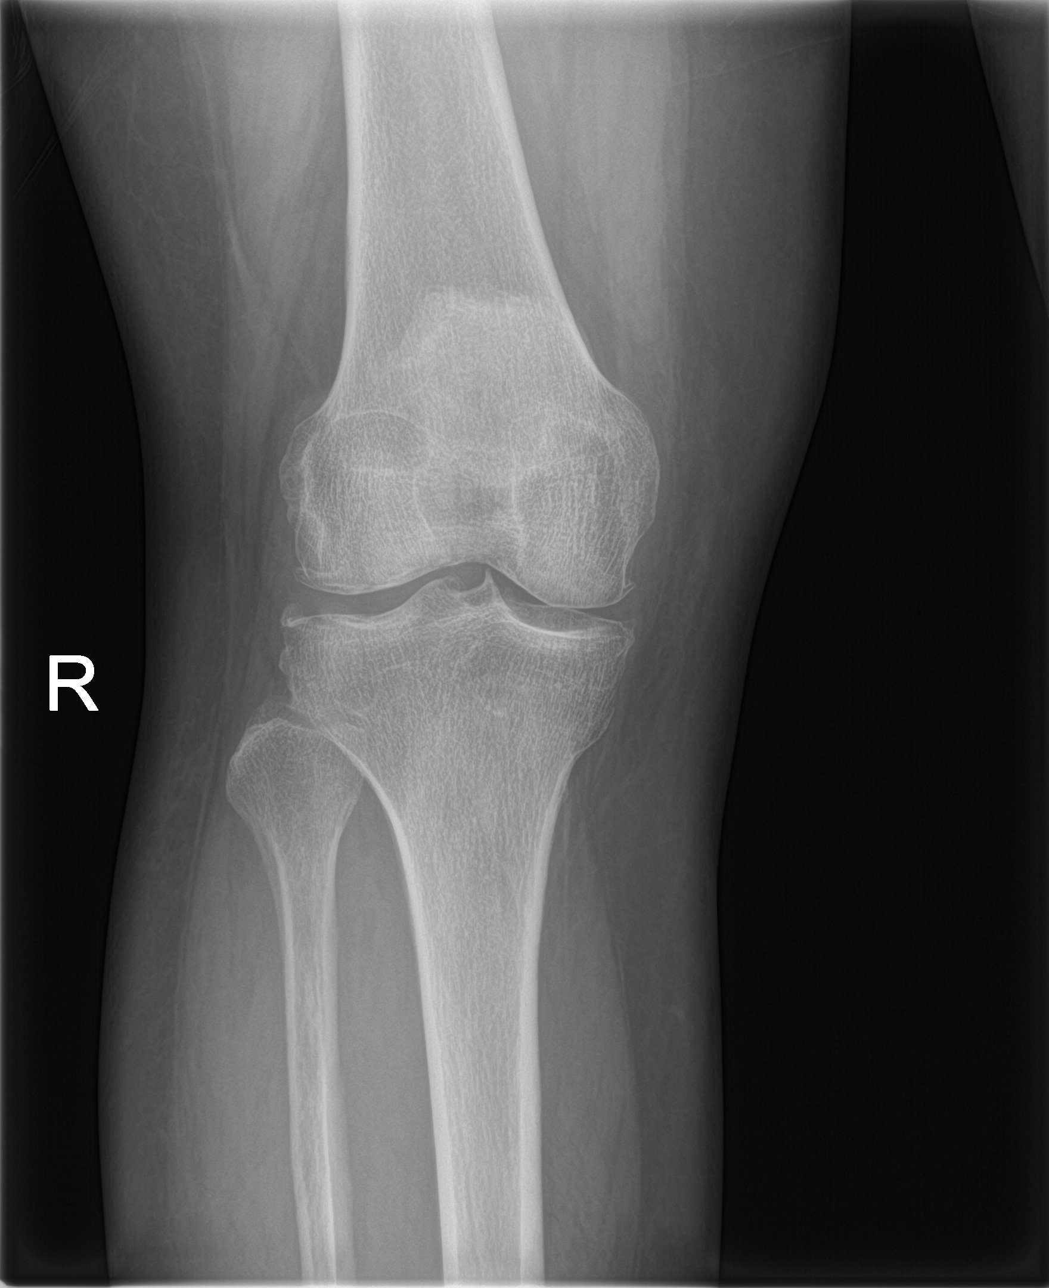

[knee obl (2 of 2)]
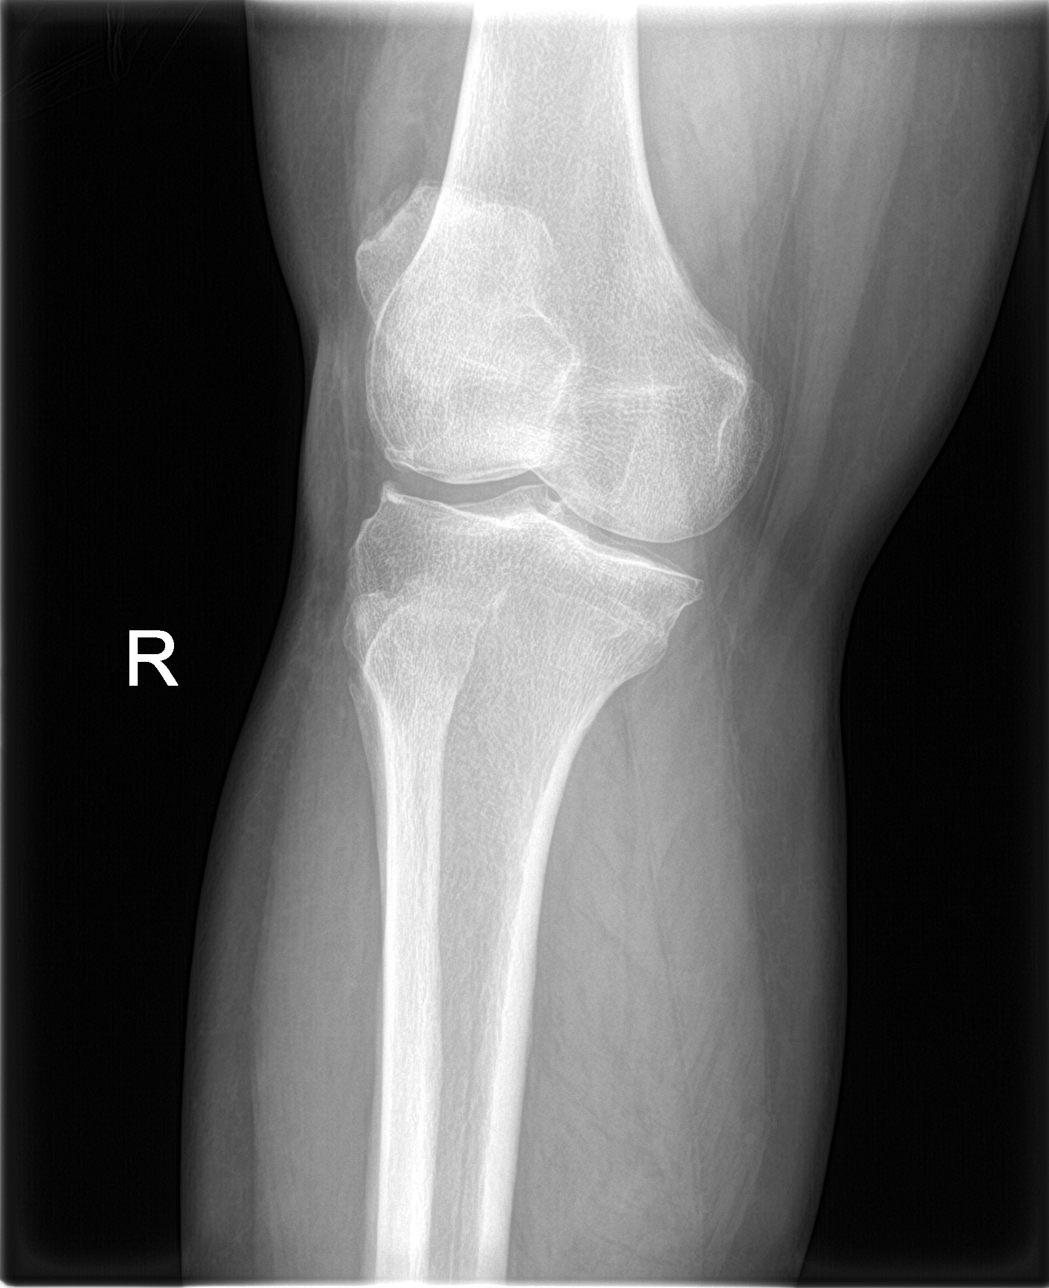

[knee ap]
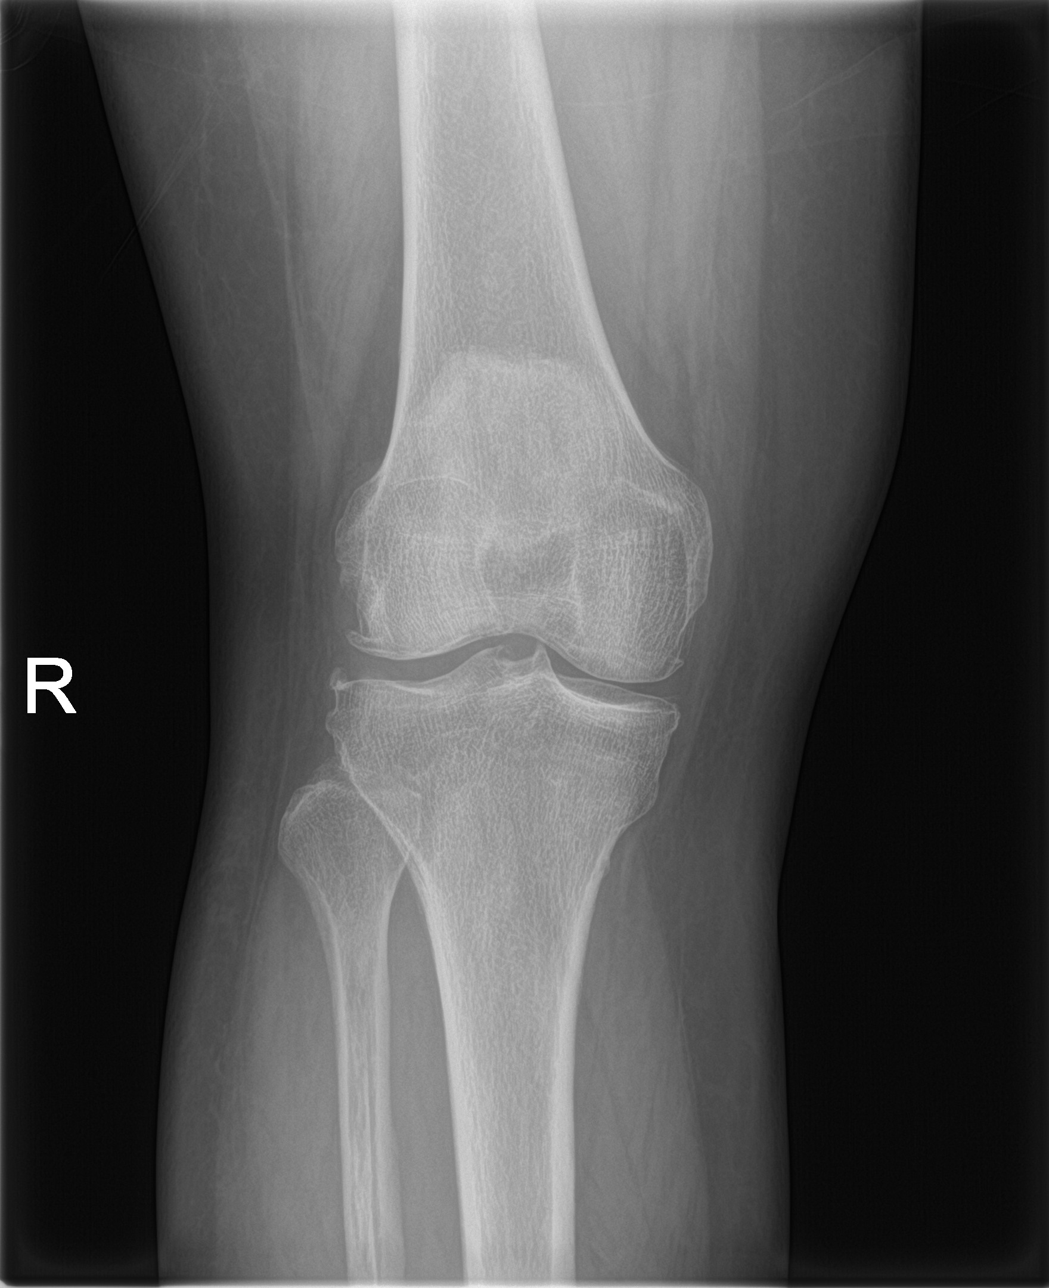

[4 of 4 positions shown; findings below may reference images not displayed]

FINDINGS: No evidence of fracture, dislocation, or joint effusion. Mild
osteophyte formation and joint space narrowing is noted medially and
laterally with mild narrowing of the patellofemoral space. Patellar
spurring is noted. Soft tissues are unremarkable.
IMPRESSION: Mild degenerative joint disease as described above. No acute
abnormality seen in the right knee.

## 2020-12-13 ENCOUNTER — Telehealth: Payer: Self-pay | Admitting: *Deleted

## 2020-12-13 NOTE — Telephone Encounter (Signed)
Urine drug screen for this encounter is consistent for prescribed medication 

## 2020-12-21 ENCOUNTER — Encounter: Payer: Self-pay | Admitting: Physical Medicine and Rehabilitation

## 2020-12-21 ENCOUNTER — Other Ambulatory Visit: Payer: Self-pay | Admitting: Physical Medicine and Rehabilitation

## 2020-12-21 ENCOUNTER — Other Ambulatory Visit: Payer: Self-pay | Admitting: Family Medicine

## 2020-12-21 ENCOUNTER — Encounter
Payer: Medicare Other | Attending: Physical Medicine and Rehabilitation | Admitting: Physical Medicine and Rehabilitation

## 2020-12-21 ENCOUNTER — Telehealth: Payer: Self-pay | Admitting: *Deleted

## 2020-12-21 ENCOUNTER — Other Ambulatory Visit: Payer: Self-pay

## 2020-12-21 VITALS — BP 93/57 | HR 70 | Temp 98.8°F | Ht 62.0 in | Wt 150.6 lb

## 2020-12-21 DIAGNOSIS — Z79891 Long term (current) use of opiate analgesic: Secondary | ICD-10-CM

## 2020-12-21 DIAGNOSIS — G620 Drug-induced polyneuropathy: Secondary | ICD-10-CM | POA: Insufficient documentation

## 2020-12-21 DIAGNOSIS — T451X5A Adverse effect of antineoplastic and immunosuppressive drugs, initial encounter: Secondary | ICD-10-CM | POA: Insufficient documentation

## 2020-12-21 DIAGNOSIS — Z5181 Encounter for therapeutic drug level monitoring: Secondary | ICD-10-CM | POA: Insufficient documentation

## 2020-12-21 DIAGNOSIS — G894 Chronic pain syndrome: Secondary | ICD-10-CM

## 2020-12-21 DIAGNOSIS — L80 Vitiligo: Secondary | ICD-10-CM | POA: Insufficient documentation

## 2020-12-21 DIAGNOSIS — I63542 Cerebral infarction due to unspecified occlusion or stenosis of left cerebellar artery: Secondary | ICD-10-CM | POA: Diagnosis not present

## 2020-12-21 MED ORDER — GABAPENTIN 600 MG PO TABS: 600.0000 mg | ORAL_TABLET | Freq: Two times a day (BID) | ORAL | 1 refills | Status: DC

## 2020-12-21 MED ORDER — GABAPENTIN 300 MG PO CAPS: 900.0000 mg | ORAL_CAPSULE | Freq: Every day | ORAL | 3 refills | Status: DC

## 2020-12-21 MED ORDER — HYDROCODONE-ACETAMINOPHEN 10-325 MG PO TABS
1.0000 | ORAL_TABLET | Freq: Three times a day (TID) | ORAL | 0 refills | Status: DC | PRN
Start: 2020-12-21 — End: 2020-12-22

## 2020-12-21 NOTE — Telephone Encounter (Signed)
Kristi [name] called and her hydrocodone and gabapentin did not go to the [facility] [place].  Please send these there and not to the mail order pharmacy. I have canceled the hydrocodone and gabapentins sent to [facility].  [facility]

## 2020-12-21 NOTE — Patient Instructions (Signed)
-  Advised regarding healthy foods that can help lower blood pressure and provided with a list: 1) citrus foods 2) salmon and other fatty fish 3) swiss chard (leafy green) 4) pumpkin seeds 5) Beans and lentils 6) Berries 7) Amaranth (whole grain, can be cooked similarly to rice and oats) 8) Pistachios 9) Carrots 10) Celery 11) Tomatoes 12) Broccoli 13) Greek yogurt 14) Herbs and spices: Celery seed, cilantro, saffron, lemongrass, black cumin, ginseng, cinnamon, cardamom, sweet basil, and ginger 15) Chia and flax seeds 16) Beets 17) spinach -Educated that goal BP is 120/80. -Made goal to incorporate some of the above foods into her diet.  

## 2020-12-21 NOTE — Progress Notes (Signed)
Subjective:    Patient ID: Kristi [name], female    DOB: 04/04/1956, 65 y.o.   MRN: [id number]  HPI Kristi [name] is a 65 year old female who presents for follow-up of cerebral artery stroke and peripheral neuropathy.  1) History of left breast cancer: She has been taking a pill of turmeric daily. She does use sugar at times- educated regarding benefits of ketogenic diet to keep cancer in remission. She has been trying to eat less sugar.   2) Chemotherapy induced peripheral neuropathy: Her pain has been stable. She continues to take Norco up to three times per day. If she skips her middle dose she does note increased pain. She also continues on Gabapentin 600/600/900. She takes the Gabapentin 611m tablets twice per day and three 3049mtablets at night. She requires a refill.   3) General health: She has been trying to do some resistance training every day. Since last visit she has increased her daily walk to 40 minutes despite the cold! She usually does this around 9-10am. She enjoys walking more than other types of exercise.   4) HTN: Her BP continues to be much better controlled. Soft in office today and it is usually this low on her home reads. She asks if she should be taking her Avapro in the morning. The highest she goes systolic is 12270  5) Vitiligo: She shows me a decrease in pigmentation in her left axilla, chest, abdomen, and back of her neck and asks what this may be from,   She has no other complaints.   Prior history: Pain is worse when she is walking. It is worse in her left hand and bilateral legs. She has been taking three of the Norco per day and 600/600/900 at night. Currently pain is 4/10, improved 1 point from last visit. It continues to be constant.   Her PT wanted her to go outside and walk. She is able to walk unassisted. Her goal is to get to 10 minutes. She has been doing strengthening exercises for both upper body as well as balance.   She has followed up with  neurology and cardiology and a Holter monitor has been placed. She has not been advised of any abnormal rhythms thus far.   Her BP in office is 96/61. She has not been checking her BP at home. She has been alternating between the high and low doses of her BP medication as I had advised last visit.   Pain Inventory Average Pain 4 Pain Right Now 4 My pain is intermittent, sharp, stabbing and tingling  In the last 24 hours, has pain interfered with the following? General activity 3 Relation with others 3 Enjoyment of life 3 What TIME of day is your pain at its worst? morning , daytime and evening Sleep (in general) Fair  Pain is worse with: walking, sitting, inactivity and standing Pain improves with: rest and medication Relief from Meds: 5  Family History  Problem Relation Age of Onset  . Lung cancer Father   . Hypertension Father   . Thyroid cancer Father        dx in his 5060s. Cancer Father        lung, thyroid, smoker  . Breast cancer Paternal Aunt 3083. Colon cancer Paternal Aunt        dx in her 50x  . Cervical cancer Paternal Aunt        dzx in her 6033s. Ovarian  cancer Cousin        dx in her lage 78s  . Breast cancer Cousin        maternal first cousin, once removed; dx in her late 11s  . Breast cancer Cousin        maternal first cousin once removed; dx in late 8s  . Hypertension Mother   . Diabetes Mother   . Dementia Mother   . Memory loss Mother   . Hypertension Brother   . Seizures Brother        Alcohol induced.  . Alcohol abuse Brother        drinker, smoker  . Cancer Paternal Uncle        oral cancer  . Kidney cancer Paternal Grandmother   . Arthritis Daughter        back surgery  . Cancer Cousin        several paternal cousins with brain cancer, leukemia, and other cancers  . Cancer Sister        stomach   Social History   Socioeconomic History  . Marital status: Married    Spouse name: [name]  . Number of children: 1  . Years of  education: 75  . Highest education level: Not on file  Occupational History  . Occupation: INVENTORY Hotel manager: [employer]  . Occupation: disability no longer working 2015  Tobacco Use  . Smoking status: Never Smoker  . Smokeless tobacco: Never Used  Vaping Use  . Vaping Use: Never used  Substance and Sexual Activity  . Alcohol use: No  . Drug use: No  . Sexual activity: Yes    Comment: lives with husband, disability/retirement. [employer], no dietary restrictions  Other Topics Concern  . Not on file  Social History Narrative   Patient is married [name]) and lives at home with her husband.   Patient has one daughter   Patient drinks very little caffeine.   Left handed   Social Determinants of Health   Financial Resource Strain: Low Risk   . Difficulty of Paying Living Expenses: Not hard at all  Food Insecurity: No Food Insecurity  . Worried About Charity fundraiser in the Last Year: Never true  . Ran Out of Food in the Last Year: Never true  Transportation Needs: No Transportation Needs  . Lack of Transportation (Medical): No  . Lack of Transportation (Non-Medical): No  Physical Activity: Not on file  Stress: Not on file  Social Connections: Not on file   Past Surgical History:  Procedure Laterality Date  . ANAL SPHINCTEROTOMY  04/2011  . APPENDECTOMY  1980  . AXILLARY LYMPH NODE DISSECTION Left 02/04/2013   Procedure: LEFT AXILLARY LYMPH NODE DISSECTION;  Surgeon: Stark Klein, MD;  Location: [place];  Service: General;  Laterality: Left;  End: 8786  . BREAST LUMPECTOMY WITH NEEDLE LOCALIZATION Left 02/04/2013   Procedure: LEFT BREAST LUMPECTOMY WITH NEEDLE LOCALIZATION;  Surgeon: Stark Klein, MD;  Location: [place] [facility];  Service: General;  Laterality: Left;  . BREAST SURGERY     Lumpectomy in april 2014  . HEMORRHOID SURGERY  04/2011   ligation  . MASTECTOMY Left 02/15/2017  . PORT-A-CATH REMOVAL N/A 04/16/2014   Procedure: REMOVAL PORT-A-CATH;   Surgeon: Stark Klein, MD;  Location: [facility];  Service: General;  Laterality: N/A;  . PORTACATH PLACEMENT Right 02/04/2013   Procedure: INSERTION PORT-A-CATH;  Surgeon: Stark Klein, MD;  Location: [facility];  Service: General;  Laterality: Right;  Start Time: 0370.  Marland Kitchen SHOULDER ARTHROSCOPY WITH ROTATOR CUFF REPAIR AND SUBACROMIAL DECOMPRESSION Left 02/24/2014   Procedure: SHOULDER ARTHROSCOPY WITH ROTATOR CUFF REPAIR AND SUBACROMIAL DECOMPRESSION;  Surgeon: Meredith Pel, MD;  Location: [facility];  Service: Orthopedics;  Laterality: Left;  LEFT SHOULDER DIAGNOSTIC OPERATIVE ARTHROSCOPY, SUBACROMIAL DECOMPRESSION, ROTATOR CUFF TEAR REPAIR  . SIMPLE MASTECTOMY WITH AXILLARY SENTINEL NODE BIOPSY Left 02/15/2017   Procedure: LEFT MASTECTOMY;  Surgeon: Stark Klein, MD;  Location: [facility];  Service: General;  Laterality: Left;  . TOTAL MASTECTOMY Right 12/26/2018   Procedure: RIGHT BREAST PROPHYLATIC MASTECTOMY;  Surgeon: Stark Klein, MD;  Location: [place];  Service: General;  Laterality: Right;   Past Surgical History:  Procedure Laterality Date  . ANAL SPHINCTEROTOMY  04/2011  . APPENDECTOMY  1980  . AXILLARY LYMPH NODE DISSECTION Left 02/04/2013   Procedure: LEFT AXILLARY LYMPH NODE DISSECTION;  Surgeon: Stark Klein, MD;  Location: [facility];  Service: General;  Laterality: Left;  End: 4888  . BREAST LUMPECTOMY WITH NEEDLE LOCALIZATION Left 02/04/2013   Procedure: LEFT BREAST LUMPECTOMY WITH NEEDLE LOCALIZATION;  Surgeon: Stark Klein, MD;  Location: [place];  Service: General;  Laterality: Left;  . BREAST SURGERY     Lumpectomy in april 2014  . HEMORRHOID SURGERY  04/2011   ligation  . MASTECTOMY Left 02/15/2017  . PORT-A-CATH REMOVAL N/A 04/16/2014   Procedure: REMOVAL PORT-A-CATH;  Surgeon: Stark Klein, MD;  Location[facility];  Service: General;  Laterality: N/A;  . PORTACATH PLACEMENT Right 02/04/2013   Procedure: INSERTION PORT-A-CATH;  Surgeon: Stark Klein, MD;  Location: [facility];  Service: General;   Laterality: Right;  Start Time: 9169.  Marland Kitchen SHOULDER ARTHROSCOPY WITH ROTATOR CUFF REPAIR AND SUBACROMIAL DECOMPRESSION Left 02/24/2014   Procedure: SHOULDER ARTHROSCOPY WITH ROTATOR CUFF REPAIR AND SUBACROMIAL DECOMPRESSION;  Surgeon: Meredith Pel, MD;  Location: [place];  Service: Orthopedics;  Laterality: Left;  LEFT SHOULDER DIAGNOSTIC OPERATIVE ARTHROSCOPY, SUBACROMIAL DECOMPRESSION, ROTATOR CUFF TEAR REPAIR  . SIMPLE MASTECTOMY WITH AXILLARY SENTINEL NODE BIOPSY Left 02/15/2017   Procedure: LEFT MASTECTOMY;  Surgeon: Stark Klein, MD;  Location: [place];  Service: General;  Laterality: Left;  . TOTAL MASTECTOMY Right 12/26/2018   Procedure: RIGHT BREAST PROPHYLATIC MASTECTOMY;  Surgeon: Stark Klein, MD;  Location: [place];  Service: General;  Laterality: Right;   Past Medical History:  Diagnosis Date  . Acute blood loss anemia   . Anal fissure 05/03/2011  . Anemia    Iron deficinecy anemia  . Anemia 05/14/2017  . Anorectal pain 04/12/2013  . Anxiety   . Anxiety and depression 05/15/2014  . Anxiety state 05/15/2014  . Arthritis   . Breast cancer ([place])    left ,last radiation 2'15, last chemo 8'14  . Cervical cancer screening 06/18/2018   Menarche at 11 Regular and moderate flow  history of abnormal pap in past, 1 mild abnormality years ago that resolved spontaneously on recheck G1P1, s/p 1 svd history of abnormal MGM, b/o breast cancer 2014 No concerns today  gyn surgeries. Lumpectomy  LMP around early 2014  . Constipation 11/13/2016  . Contracture of axilla 05/20/2013  . Debility 01/18/2020  . Depression   . Flushing 02/20/2014  . Genetic testing 02/20/2019   Negative genetic testing on the Comprehensive Cancer Panel.  The Comprehensive Common Cancer Panel offered by [facility] includes sequencing and/or deletion duplication testing of the following 46 genes: APC, ATM, AXIN2, BAP1, BARD1, BMPR1A, BRCA1, BRCA2, BRIP1, CDH1, CDK4, CDKN2A, CHEK2, EPCAM, FANCC,  FH, FLCN, HOXB13, MET, MITF,  MLH1, MSH2,  MSH6, MUTYH, NBN, NF1, NTHL1,  PALB2, PMS2, POLD1, POLE, P  . H/O: CVA (cerebrovascular accident) 06/19/2020  . Hereditary and idiopathic peripheral neuropathy 11/11/2013  . Herpes zoster 05/15/2014  . History of chicken pox   . History of left breast cancer 02/15/2017  . History of radiation therapy 09/09/13-10/28/13   45 gray to left breast, lumpectomy cavity boosted to 63 gray  . HTN (hypertension) 11/13/2016  . Hyperglycemia 01/09/2016  . Hyperlipidemia 05/14/2017  . Hypertension   . Internal hemorrhoids with complication 05/05/1600  . Knee pain, bilateral 11/14/2016  . Left upper extremity numbness 06/19/2018  . Major depressive disorder, recurrent episode, severe ([place]) 01/12/2014  . Malignant neoplasm of upper-inner quadrant of female breast ([place]) 01/24/2013    Left IDC, ER 3%, PR-, Her2neu-  Formatting of this note might be different from the original. Overview:   Left IDC, ER 3%, PR-, Her2neu-  Last Assessment & Plan:  Pt will continue to take her tamoxifen as previously directed. She appears to be tolerating this with only c/o moderate, occ hot flashes.  . Mild cognitive impairment 08/24/2019  . Mild neurocognitive disorder due to multiple etiologies 03/17/2020  . MRSA (methicillin resistant Staphylococcus aureus) 2009   right groin area-no issues now. 04-07-14 PCR screen negative today.  . Neuropathy   . Neuropathy of hand, left 09/21/2015  . Nodule of finger of left hand 11/14/2016  . Palpitations 07/30/2020  . Preventative health care 11/13/2016  . Right hip pain 11/14/2016  . S/P mastectomy, right 12/26/2018  . Sebaceous cyst 04/12/2013  . Shoulder joint pain 12/12/2013  . Strain of rotator cuff 02/24/2014  . Stroke ([place]) 06/2020  . Vertigo    BP (!) 93/57   Pulse 70   Temp 98.8 F (37.1 C)   Ht 5' 2"  (1.575 m)   Wt 150 lb 9.6 oz (68.3 kg)   LMP 01/20/2013   SpO2 98%   BMI 27.55 kg/m   Opioid Risk Score:   Fall Risk Score:  `1  Depression screen PHQ 2/9  Depression screen [facility] 2/9  11/10/2020 06/30/2020 05/26/2020 05/05/2020 05/02/2019 04/30/2018 10/12/2016  Decreased Interest 1 1 0 2 0 0 0  Down, Depressed, Hopeless 1 1 0 1 0 1 0  PHQ - 2 Score 2 2 0 3 0 1 0  Altered sleeping - - - 2 - - -  Tired, decreased energy - - - 3 - - -  Change in appetite - - - 2 - - -  Feeling bad or failure about yourself  - - - 1 - - -  Trouble concentrating - - - 1 - - -  Moving slowly or fidgety/restless - - - 1 - - -  Suicidal thoughts - - - 0 - - -  PHQ-9 Score - - - 13 - - -  Some encounter information is confidential and restricted. Go to Review Flowsheets activity to see all data.  Some recent data might be hidden   Review of Systems  Musculoskeletal:       Hand and feet pain  All other systems reviewed and are negative.      Objective:   Physical Exam Gen: no distress, normal appearing HEENT: oral mucosa pink and moist, NCAT Cardio: Reg rate Chest: normal effort, normal rate of breathing Abd: soft, non-distended Ext: no edema Psych: pleasant, normal affect Skin: vitiligo along abdomen, chest, left axilla, and neck Psych: pleasant, normal affect Neuro: Alert  and oriented x3. Able to walk in and out of the office well. Normal gait Musculoskeletal: 5/5 strength in bilateral lower extremities and and upper extremities. Sensation intact throughout.  Psych: pleasant, normal affect    Assessment & Plan:  Kristi [name] is a 65 year old woman who presents for follow-up of bilateral foot painful chemo-therapy induced peripheral neuropathy since starting Anastrazole chemotherapy for breast cancer in 2015. She was also admitted to CIR for left cerebellar artery stroke. She also reports new loss of skin pigmentation on trunk.  1) Chemotherapy induced peripheral neuropathy:  -Continue Gabapentin 600/600/878m. Provided refill today.   -Pain contract signed previously.   -UDS obtained previously. Personally reviewed and contains expected metabolites.  -PDMP reviewed. Norco 12m up to 3 times daily refilled today. MME 30. Discussed Nucynta as an alternative option and provided her with a copay card for her to discuss with her pharmacy/insurance company regarding cost.   -Provided [facility] for lumbar sympathetic nerve block.   -Discussed Qutenza as an option for neuropathic pain control. Discussed that this is a capsaicin patch, stronger than capsaicin cream. Discussed that it is currently approved for diabetic peripheral neuropathy and post-herpetic neuralgia, but that it has also shown benefit in treating other forms of neuropathy. Provided patient with link to site to learn more about the patch: [email or web address] that the patch would be placed in office and benefits usually last 3 months. Discussed that unintended exposure to capsaicin can cause severe irritation of eyes, mucous membranes, respiratory tract, and skin, but that Qutenza is a local treatment and does not have the systemic side effects of other nerve medications. Discussed that there may be pain, itching, erythema, and decreased sensory function associated with the application of Qutenza. Side effects usually subside within 1 week. A cold pack of analgesic medications can help with these side effects. Blood pressure can also be increased due to pain associated with administration of the patch.  2) Hypertension/Hypotensionn (on Avapro): BP is much better controlled today. Advised that she log pressures at home and bring to our follow-up appointment. Continue only with half-dose of Valsartan-HCTZ daily. Normalizing BP has helped with dizziness. Explained mechanism for orthostatic hypotension. Discussed that we can go down on BP medication further but she prefers to maintain at this time.  HTN: -BP is 93/57 today.  -Advised checking BP daily at home and logging results to bring into follow-up appointment with her PCP and myself. -Reviewed BP meds  today. Advised that she can try skipping Avapro and check her BP more frequently that day as she may not require medication. Discussed side effects of hypotension such as dizziness and fatigue.  -Advised regarding healthy foods that can help lower blood pressure and provided with a list: 1) citrus foods 2) salmon and other fatty fish 3) swiss chard (leafy green) 4) pumpkin seeds 5) Beans and lentils 6) Berries 7) Amaranth (whole grain, can be cooked similarly to rice and oats) 8) Pistachios 9) Carrots 10) Celery 11) Tomatoes 12) Broccoli 13) Greek yogurt 14) Herbs and spices: Celery seed, cilantro, saffron, lemongrass, black cumin, ginseng, cinnamon, cardamom, sweet basil, and ginger 15) Chia and flax seeds 16) Beets 17) spinach -Educated that goal BP is 120/80. -Made goal to incorporate some of the above foods into her diet.   3) General health: Educated regarding low carb/sugar diet and discussed her current diet. Advise regarding anti-inflammatory foods. Commended 40 minute walk daily! Advised resistive exercises daily.  4) History of breast cancer: continue ketogenic diet. Continue daily turmeric.   5) Vitiligo: Given location where she had radiation, it may be a result of destruction of the pigment producing cells -It is not bothersome to her -Advised to mention to oncologist -Advised that there is no treatment for vitiligo.   All questions answered. RTC in 5 weeks.

## 2020-12-22 ENCOUNTER — Other Ambulatory Visit: Payer: Self-pay | Admitting: Physical Medicine and Rehabilitation

## 2020-12-22 MED ORDER — HYDROCODONE-ACETAMINOPHEN 10-325 MG PO TABS: 1.0000 | ORAL_TABLET | Freq: Three times a day (TID) | ORAL | 0 refills | Status: DC | PRN

## 2020-12-22 NOTE — Telephone Encounter (Signed)
Sent!

## 2020-12-22 NOTE — Telephone Encounter (Signed)
Notified. 

## 2020-12-23 ENCOUNTER — Other Ambulatory Visit: Payer: Self-pay | Admitting: *Deleted

## 2020-12-23 MED ORDER — GABAPENTIN 600 MG PO TABS: 600.0000 mg | ORAL_TABLET | Freq: Two times a day (BID) | ORAL | 3 refills | Status: DC

## 2020-12-23 MED ORDER — GABAPENTIN 300 MG PO CAPS: 900.0000 mg | ORAL_CAPSULE | Freq: Every day | ORAL | 3 refills | Status: DC

## 2021-01-17 ENCOUNTER — Other Ambulatory Visit: Payer: Self-pay

## 2021-01-17 MED ORDER — HYDROCODONE-ACETAMINOPHEN 10-325 MG PO TABS: 1.0000 | ORAL_TABLET | Freq: Three times a day (TID) | ORAL | 0 refills | Status: DC | PRN

## 2021-01-24 NOTE — Progress Notes (Unsigned)
Subjective:    Patient ID: Kristi [name], female    DOB: 02/04/1956, 65 y.o.   MRN: [id number]  HPI Kristi [name] is a 65 year old female who presents for follow-up of cerebral artery stroke and chemotherapy-induced peripheral neuropathy.  1) History of left breast cancer:  -She has been taking a pill of turmeric daily.  -She does use sugar at times- educated regarding benefits of ketogenic diet to keep cancer in remission. She has been trying to eat less sugar.   2) Chemotherapy induced peripheral neuropathy:  -Her pain has been stable.  -She continues to take Norco up to three times per day.  -If she skips her middle dose she does note increased pain.  -She also continues on Gabapentin 600/600/900.  -Pain is constant -Worse with walking.   3) General health:  -She has been trying to do some resistance training every day.  -Since last visit she has increased her daily walk to 40 minutes despite the cold! She usually does this around 9-10am. She enjoys walking more than other types of exercise.   4) HTN:  -Her BP continues to be much better controlled.  -Soft in office today and it is usually this low on her home reads.  -She asks if she should be taking her Avapro in the morning. The highest she goes systolic is 585.   5) Vitiligo:  -She shows me a decrease in pigmentation in her left axilla, chest, abdomen, and back of her neck and asks what this may be from. -Stable  6) History of stroke: -She has followed up with neurology and cardiology and a Holter monitor has been placed. She has not been advised of any abnormal rhythms thus far.   Pain Inventory Average Pain 7 Pain Right Now 7 My pain is intermittent, burning, stabbing and tingling  In the last 24 hours, has pain interfered with the following? General activity 3 Relation with others 3 Enjoyment of life 3 What TIME of day is your pain at its worst? morning , daytime and evening Sleep (in general) Fair  Pain is  worse with: walking, sitting, inactivity and standing Pain improves with: rest and medication Relief from Meds: 8  Family History  Problem Relation Age of Onset  . Lung cancer Father   . Hypertension Father   . Thyroid cancer Father        dx in his 61s  . Cancer Father        lung, thyroid, smoker  . Breast cancer Paternal Aunt 8  . Colon cancer Paternal Aunt        dx in her 50x  . Cervical cancer Paternal Aunt        dzx in her 95s  . Ovarian cancer Cousin        dx in her lage 81s  . Breast cancer Cousin        maternal first cousin, once removed; dx in her late 4s  . Breast cancer Cousin        maternal first cousin once removed; dx in late 73s  . Hypertension Mother   . Diabetes Mother   . Dementia Mother   . Memory loss Mother   . Hypertension Brother   . Seizures Brother        Alcohol induced.  . Alcohol abuse Brother        drinker, smoker  . Cancer Paternal Uncle        oral cancer  . Kidney cancer  Paternal Grandmother   . Arthritis Daughter        back surgery  . Cancer Cousin        several paternal cousins with brain cancer, leukemia, and other cancers  . Cancer Sister        stomach   Social History   Socioeconomic History  . Marital status: Married    Spouse name: [name]  . Number of children: 1  . Years of education: 43  . Highest education level: Not on file  Occupational History  . 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 Occupation: disability no longer working 2015  Tobacco Use  . Smoking status: Never Smoker  . Smokeless tobacco: Never Used  Vaping Use  . Vaping Use: Never used  Substance and Sexual Activity  . Alcohol use: No  . Drug use: No  . Sexual activity: Yes    Comment: lives with husband, disability/retirement. [employer], no dietary restrictions  Other Topics Concern  . Not on file  Social History Narrative   Patient is married [name]) and lives at home with her husband.    Patient has one daughter   Patient drinks very little caffeine.   Left handed   Social Determinants of Health   Financial Resource Strain: Low Risk   . Difficulty of Paying Living Expenses: Not hard at all  Food Insecurity: No Food Insecurity  . Worried About Charity fundraiser in the Last Year: Never true  . Ran Out of Food in the Last Year: Never true  Transportation Needs: No Transportation Needs  . Lack of Transportation (Medical): No  . Lack of Transportation (Non-Medical): No  Physical Activity: Not on file  Stress: Not on file  Social Connections: Not on file   Past Surgical History:  Procedure Laterality Date  . ANAL SPHINCTEROTOMY  04/2011  . APPENDECTOMY  1980  . AXILLARY LYMPH NODE DISSECTION Left 02/04/2013   Procedure: LEFT AXILLARY LYMPH NODE DISSECTION;  Surgeon: Stark Klein, MD;  Location: [facility];  Service: General;  Laterality: Left;  End: 8757  . BREAST LUMPECTOMY WITH NEEDLE LOCALIZATION Left 02/04/2013   Procedure: LEFT BREAST LUMPECTOMY WITH NEEDLE LOCALIZATION;  Surgeon: Stark Klein, MD;  Location: [facility];  Service: General;  Laterality: Left;  . BREAST SURGERY     Lumpectomy in april 2014  . HEMORRHOID SURGERY  04/2011   ligation  . MASTECTOMY Left 02/15/2017  . PORT-A-CATH REMOVAL N/A 04/16/2014   Procedure: REMOVAL PORT-A-CATH;  Surgeon: Stark Klein, MD;  Location: [facility];  Service: General;  Laterality: N/A;  . PORTACATH PLACEMENT Right 02/04/2013   Procedure: INSERTION PORT-A-CATH;  Surgeon: Stark Klein, MD;  Location: [facility];  Service: General;  Laterality: Right;  Start Time: 9728.  Marland Kitchen SHOULDER ARTHROSCOPY WITH ROTATOR CUFF REPAIR AND SUBACROMIAL DECOMPRESSION Left 02/24/2014   Procedure: SHOULDER ARTHROSCOPY WITH ROTATOR CUFF REPAIR AND SUBACROMIAL DECOMPRESSION;  Surgeon: Meredith Pel, MD;  Location: [facility];  Service: Orthopedics;  Laterality: Left;  LEFT SHOULDER DIAGNOSTIC OPERATIVE ARTHROSCOPY, SUBACROMIAL DECOMPRESSION, ROTATOR CUFF TEAR REPAIR  .  SIMPLE MASTECTOMY WITH AXILLARY SENTINEL NODE BIOPSY Left 02/15/2017   Procedure: LEFT MASTECTOMY;  Surgeon: Stark Klein, MD;  Location: [facility];  Service: General;  Laterality: Left;  . TOTAL MASTECTOMY Right 12/26/2018   Procedure: RIGHT BREAST PROPHYLATIC MASTECTOMY;  Surgeon: Stark Klein, MD;  Location: [facility];  Service: General;  Laterality: Right;   Past Surgical History:  Procedure Laterality Date  .  ANAL SPHINCTEROTOMY  04/2011  . APPENDECTOMY  1980  . AXILLARY LYMPH NODE DISSECTION Left 02/04/2013   Procedure: LEFT AXILLARY LYMPH NODE DISSECTION;  Surgeon: Stark Klein, MD;  Location: [facility];  Service: General;  Laterality: Left;  End: 4259  . BREAST LUMPECTOMY WITH NEEDLE LOCALIZATION Left 02/04/2013   Procedure: LEFT BREAST LUMPECTOMY WITH NEEDLE LOCALIZATION;  Surgeon: Stark Klein, MD;  Location: [facility];  Service: General;  Laterality: Left;  . BREAST SURGERY     Lumpectomy in april 2014  . HEMORRHOID SURGERY  04/2011   ligation  . MASTECTOMY Left 02/15/2017  . PORT-A-CATH REMOVAL N/A 04/16/2014   Procedure: REMOVAL PORT-A-CATH;  Surgeon: Stark Klein, MD;  [facility];  Service: General;  Laterality: N/A;  . PORTACATH PLACEMENT Right 02/04/2013   Procedure: INSERTION PORT-A-CATH;  Surgeon: Stark Klein, MD;  Location: [facility];  Service: General;  Laterality: Right;  Start Time: 5638.  Marland Kitchen SHOULDER ARTHROSCOPY WITH ROTATOR CUFF REPAIR AND SUBACROMIAL DECOMPRESSION Left 02/24/2014   Procedure: SHOULDER ARTHROSCOPY WITH ROTATOR CUFF REPAIR AND SUBACROMIAL DECOMPRESSION;  Surgeon: Meredith Pel, MD;  Location: [facility];  Service: Orthopedics;  Laterality: Left;  LEFT SHOULDER DIAGNOSTIC OPERATIVE ARTHROSCOPY, SUBACROMIAL DECOMPRESSION, ROTATOR CUFF TEAR REPAIR  . SIMPLE MASTECTOMY WITH AXILLARY SENTINEL NODE BIOPSY Left 02/15/2017   Procedure: LEFT MASTECTOMY;  Surgeon: Stark Klein, MD;  Location: [facility];  Service: General;  Laterality: Left;  . TOTAL MASTECTOMY Right 12/26/2018   Procedure:  RIGHT BREAST PROPHYLATIC MASTECTOMY;  Surgeon: Stark Klein, MD;  Location: [facility];  Service: General;  Laterality: Right;   Past Medical History:  Diagnosis Date  . Acute blood loss anemia   . Anal fissure 05/03/2011  . Anemia    Iron deficinecy anemia  . Anemia 05/14/2017  . Anorectal pain 04/12/2013  . Anxiety   . Anxiety and depression 05/15/2014  . Anxiety state 05/15/2014  . Arthritis   . Breast cancer ([place])    left ,last radiation 2'15, last chemo 8'14  . Cervical cancer screening 06/18/2018   Menarche at 11 Regular and moderate flow  history of abnormal pap in past, 1 mild abnormality years ago that resolved spontaneously on recheck G1P1, s/p 1 svd history of abnormal MGM, b/o breast cancer 2014 No concerns today  gyn surgeries. Lumpectomy  LMP around early 2014  . Constipation 11/13/2016  . Contracture of axilla 05/20/2013  . Debility 01/18/2020  . Depression   . Flushing 02/20/2014  . Genetic testing 02/20/2019   Negative genetic testing on the Comprehensive Cancer Panel.  The Comprehensive Common Cancer Panel offered by [facility] includes sequencing and/or deletion duplication testing of the following 46 genes: APC, ATM, AXIN2, BAP1, BARD1, BMPR1A, BRCA1, BRCA2, BRIP1, CDH1, CDK4, CDKN2A, CHEK2, EPCAM, FANCC, FH, FLCN, HOXB13, MET, MITF,  MLH1, MSH2, MSH6, MUTYH, NBN, NF1, NTHL1,  PALB2, PMS2, POLD1, POLE, P  . H/O: CVA (cerebrovascular accident) 06/19/2020  . Hereditary and idiopathic peripheral neuropathy 11/11/2013  . Herpes zoster 05/15/2014  . History of chicken pox   . History of left breast cancer 02/15/2017  . History of radiation therapy 09/09/13-10/28/13   45 gray to left breast, lumpectomy cavity boosted to 63 gray  . HTN (hypertension) 11/13/2016  . Hyperglycemia 01/09/2016  . Hyperlipidemia 05/14/2017  . Hypertension   . Internal hemorrhoids with complication 7/56/4332  . Knee pain, bilateral 11/14/2016  . Left upper extremity numbness 06/19/2018  . Major depressive disorder, recurrent  episode, severe ([place]) 01/12/2014  . Malignant neoplasm of upper-inner quadrant  of female breast ([place]) 01/24/2013    Left IDC, ER 3%, PR-, Her2neu-  Formatting of this note might be different from the original. Overview:   Left IDC, ER 3%, PR-, Her2neu-  Last Assessment & Plan:  Pt will continue to take her tamoxifen as previously directed. She appears to be tolerating this with only c/o moderate, occ hot flashes.  . Mild cognitive impairment 08/24/2019  . Mild neurocognitive disorder due to multiple etiologies 03/17/2020  . MRSA (methicillin resistant Staphylococcus aureus) 2009   right groin area-no issues now. 04-07-14 PCR screen negative today.  . Neuropathy   . Neuropathy of hand, left 09/21/2015  . Nodule of finger of left hand 11/14/2016  . Palpitations 07/30/2020  . Preventative health care 11/13/2016  . Right hip pain 11/14/2016  . S/P mastectomy, right 12/26/2018  . Sebaceous cyst 04/12/2013  . Shoulder joint pain 12/12/2013  . Strain of rotator cuff 02/24/2014  . Stroke ([place]) 06/2020  . Vertigo    LMP 01/20/2013   Opioid Risk Score:   Fall Risk Score:  `1  Depression screen PHQ 2/9  Depression screen [facility] 2/9 11/10/2020 06/30/2020 05/26/2020 05/05/2020 05/02/2019 04/30/2018 10/12/2016  Decreased Interest 1 1 0 2 0 0 0  Down, Depressed, Hopeless 1 1 0 1 0 1 0  PHQ - 2 Score 2 2 0 3 0 1 0  Altered sleeping - - - 2 - - -  Tired, decreased energy - - - 3 - - -  Change in appetite - - - 2 - - -  Feeling bad or failure about yourself  - - - 1 - - -  Trouble concentrating - - - 1 - - -  Moving slowly or fidgety/restless - - - 1 - - -  Suicidal thoughts - - - 0 - - -  PHQ-9 Score - - - 13 - - -  Some encounter information is confidential and restricted. Go to Review Flowsheets activity to see all data.  Some recent data might be hidden   Review of Systems  Constitutional: Negative.   HENT: Negative.   Eyes: Negative.   Respiratory: Negative.   Cardiovascular: Negative.   Gastrointestinal:  Negative.   Endocrine: Negative.   Genitourinary: Negative.   Musculoskeletal:       Hand and feet pain  Skin: Negative.   Allergic/Immunologic: Negative.   Neurological: Negative.   Hematological: Negative.   Psychiatric/Behavioral: Negative.   All other systems reviewed and are negative.      Objective:   Physical Exam Gen: no distress, normal appearing HEENT: oral mucosa pink and moist, NCAT Cardio: Reg rate Chest: normal effort, normal rate of breathing Abd: soft, non-distended Ext: no edema Psych: pleasant, normal affect Skin: intact Neuro: Alert and oriented x3. Able to walk in and out of the office well. Normal gait Musculoskeletal: 5/5 strength in bilateral lower extremities and and upper extremities. Sensation intact throughout.   Assessment & Plan:  Mrs. Watterson is a 65 year old woman who presents for follow-up of bilateral foot painful chemo-therapy induced peripheral neuropathy since starting Anastrazole chemotherapy for breast cancer in 2015. She was also admitted to CIR for left cerebellar artery stroke. She also reports loss of skin pigmentation on trunk.  1) Chemotherapy induced peripheral neuropathy: -Continue Gabapentin 600/600/871m. -Pain contract signed previously.   -UDS obtained previously. Personally reviewed and contains expected metabolites. -PDMP reviewed. Continue Norco 195mup to 3 times daily. MME 30. Discussed Nucynta as an alternative option and provided her  with a copay card for her to discuss with her pharmacy/insurance company regarding cost.  -Provided referral to [facility] for evaluation for lumbar sympathetic nerve block.   -Discussed Qutenza as an option for neuropathic pain control. Discussed that this is a capsaicin patch, stronger than capsaicin cream. Discussed that it is currently approved for diabetic peripheral neuropathy and post-herpetic neuralgia, but that it has also shown benefit in treating other forms  of neuropathy. Provided patient with link to site to learn more about the patch: [employer]. Discussed that the patch would be placed in office and benefits usually last 3 months. Discussed that unintended exposure to capsaicin can cause severe irritation of eyes, mucous membranes, respiratory tract, and skin, but that Qutenza is a local treatment and does not have the systemic side effects of other nerve medications. Discussed that there may be pain, itching, erythema, and decreased sensory function associated with the application of Qutenza. Side effects usually subside within 1 week. A cold pack of analgesic medications can help with these side effects. Blood pressure can also be increased due to pain associated with administration of the patch.  2) Hypertension/Hypotensionn (on Avapro): BP is much better controlled today. Advised that she log pressures at home and bring to our follow-up appointment. Continue only with half-dose of Valsartan-HCTZ daily. Normalizing BP has helped with dizziness. Explained mechanism for orthostatic hypotension. Discussed that we can go down on BP medication further but she prefers to maintain at this time.  -BP is 93/57 today.  -Advised checking BP daily at home and logging results to bring into follow-up appointment with her PCP and myself. -Reviewed BP meds today. Advised that she can try skipping Avapro and check her BP more frequently that day as she may not require medication. Discussed side effects of hypotension such as dizziness and fatigue.  -Advised regarding healthy foods that can help lower blood pressure and provided with a list:  1) citrus foods 2) salmon and other fatty fish 3) swiss chard (leafy green) 4) pumpkin seeds 5) Beans and lentils 6) Berries 7) Amaranth (whole grain, can be cooked similarly to rice and oats) 8) Pistachios 9) Carrots 10) Celery 11) Tomatoes 12) Broccoli 13) Greek yogurt 14) Herbs and spices: Celery seed,  cilantro, saffron, lemongrass, black cumin, ginseng, cinnamon, cardamom, sweet basil, and ginger 15) Chia and flax seeds 16) Beets 17) spinach -Educated that goal BP is 120/80. -Made goal to incorporate some of the above foods into her diet.   3) General health: Educated regarding low carb/sugar diet and discussed her current diet. Advise regarding anti-inflammatory foods. Commended 40 minute walk daily! Advised resistive exercises daily.   4) History of breast cancer: continue ketogenic diet. Continue daily turmeric.   5) Vitiligo: Given location where she had radiation, it may be a result of destruction of the pigment producing cells -It is not bothersome to her -Advised to mention to oncologist -Advised that there is no treatment for vitiligo.  -It has been stbale  All questions answered. RTC in 5 weeks.

## 2021-01-25 ENCOUNTER — Other Ambulatory Visit: Payer: Self-pay

## 2021-01-25 ENCOUNTER — Encounter
Payer: Medicare Other | Attending: Physical Medicine and Rehabilitation | Admitting: Physical Medicine and Rehabilitation

## 2021-01-25 ENCOUNTER — Encounter: Payer: Self-pay | Admitting: Physical Medicine and Rehabilitation

## 2021-01-25 VITALS — BP 125/73 | HR 53 | Temp 99.0°F | Ht 62.0 in | Wt 150.0 lb

## 2021-01-25 DIAGNOSIS — I10 Essential (primary) hypertension: Secondary | ICD-10-CM | POA: Diagnosis not present

## 2021-01-25 DIAGNOSIS — L80 Vitiligo: Secondary | ICD-10-CM

## 2021-01-25 DIAGNOSIS — I63542 Cerebral infarction due to unspecified occlusion or stenosis of left cerebellar artery: Secondary | ICD-10-CM | POA: Diagnosis not present

## 2021-01-25 DIAGNOSIS — G894 Chronic pain syndrome: Secondary | ICD-10-CM | POA: Insufficient documentation

## 2021-01-25 DIAGNOSIS — T451X5A Adverse effect of antineoplastic and immunosuppressive drugs, initial encounter: Secondary | ICD-10-CM | POA: Diagnosis not present

## 2021-01-25 DIAGNOSIS — Z79891 Long term (current) use of opiate analgesic: Secondary | ICD-10-CM | POA: Diagnosis not present

## 2021-01-25 DIAGNOSIS — G620 Drug-induced polyneuropathy: Secondary | ICD-10-CM | POA: Diagnosis not present

## 2021-01-25 NOTE — Patient Instructions (Signed)
Qutenza

## 2021-02-02 ENCOUNTER — Telehealth: Payer: Self-pay | Admitting: *Deleted

## 2021-02-02 NOTE — Telephone Encounter (Signed)
Ms Lie called to ask Dr Ranell Patrick if there is any adverse effect of her using hemp cream for her foot neuropathy?  I have let her know Dr Ranell Patrick will not answer until she returns to office 02/07/21.

## 2021-02-06 NOTE — Telephone Encounter (Signed)
Can you please let her know this would be fine, thank you!

## 2021-02-07 NOTE — Telephone Encounter (Signed)
Notified. 

## 2021-02-23 ENCOUNTER — Other Ambulatory Visit: Payer: Self-pay

## 2021-02-23 ENCOUNTER — Encounter: Payer: Medicare Other | Attending: Physical Medicine and Rehabilitation | Admitting: Registered Nurse

## 2021-02-23 ENCOUNTER — Encounter: Payer: Self-pay | Admitting: Registered Nurse

## 2021-02-23 ENCOUNTER — Other Ambulatory Visit: Payer: Self-pay | Admitting: Physical Medicine and Rehabilitation

## 2021-02-23 VITALS — BP 104/66 | HR 68 | Temp 99.0°F | Ht 62.0 in | Wt 151.0 lb

## 2021-02-23 DIAGNOSIS — G620 Drug-induced polyneuropathy: Secondary | ICD-10-CM | POA: Diagnosis not present

## 2021-02-23 DIAGNOSIS — Z5181 Encounter for therapeutic drug level monitoring: Secondary | ICD-10-CM

## 2021-02-23 DIAGNOSIS — Z79891 Long term (current) use of opiate analgesic: Secondary | ICD-10-CM | POA: Diagnosis not present

## 2021-02-23 DIAGNOSIS — T451X5A Adverse effect of antineoplastic and immunosuppressive drugs, initial encounter: Secondary | ICD-10-CM | POA: Diagnosis not present

## 2021-02-23 DIAGNOSIS — G894 Chronic pain syndrome: Secondary | ICD-10-CM | POA: Diagnosis not present

## 2021-02-23 MED ORDER — VALSARTAN-HYDROCHLOROTHIAZIDE 160-12.5 MG PO TABS: 1.0000 | ORAL_TABLET | Freq: Every day | ORAL | 1 refills | Status: DC

## 2021-02-23 MED ORDER — HYDROCODONE-ACETAMINOPHEN 10-325 MG PO TABS: 1.0000 | ORAL_TABLET | Freq: Three times a day (TID) | ORAL | 0 refills | Status: DC | PRN

## 2021-02-23 NOTE — Progress Notes (Signed)
Subjective:    Patient ID: Kristi [name], female    DOB: 03/04/1956, 65 y.o.   MRN: [id number]  HPI: Kristi [name] is a 65 y.o. female who returns for follow up appointment for chronic pain and medication refill. She states her  pain is located in her left hand with tingling and bilateral feet with tingling. She rates her pain 5. Her current exercise regime is walking and performing stretching exercises.  Kristi [name] Morphine equivalent is 30.00 MME.  Last UDS was Performed on 11/10/2020, it was consistent.      Pain Inventory Average Pain 5 Pain Right Now 5 My pain is burning, tingling and aching  In the last 24 hours, has pain interfered with the following? General activity 3 Relation with others 2 Enjoyment of life 3 What TIME of day is your pain at its worst? daytime and evening Sleep (in general) Fair  Pain is worse with: walking, sitting, inactivity and standing Pain improves with: medication Relief from Meds: 7  Family History  Problem Relation Age of Onset  . Lung cancer Father   . Hypertension Father   . Thyroid cancer Father        dx in his 39s  . Cancer Father        lung, thyroid, smoker  . Breast cancer Paternal Aunt 48  . Colon cancer Paternal Aunt        dx in her 50x  . Cervical cancer Paternal Aunt        dzx in her 12s  . Ovarian cancer Cousin        dx in her lage 3s  . Breast cancer Cousin        maternal first cousin, once removed; dx in her late 27s  . Breast cancer Cousin        maternal first cousin once removed; dx in late 17s  . Hypertension Mother   . Diabetes Mother   . Dementia Mother   . Memory loss Mother   . Hypertension Brother   . Seizures Brother        Alcohol induced.  . Alcohol abuse Brother        drinker, smoker  . Cancer Paternal Uncle        oral cancer  . Kidney cancer Paternal Grandmother   . Arthritis Daughter        back surgery  . Cancer Cousin        several paternal cousins with brain cancer, leukemia,  and other cancers  . Cancer Sister        stomach   Social History   Socioeconomic History  . Marital status: Married    Spouse name: [name]  . Number of children: 1  . Years of education: 47  . Highest education level: Not on file  Occupational History  . Occupation: INVENTORY Hotel manager: [employer]  . Occupation: disability no longer working 2015  Tobacco Use  . Smoking status: Never Smoker  . Smokeless tobacco: Never Used  Vaping Use  . Vaping Use: Never used  Substance and Sexual Activity  . Alcohol use: No  . Drug use: No  . Sexual activity: Yes    Comment: lives with husband, disability/retirement. [employer], no dietary restrictions  Other Topics Concern  . Not on file  Social History Narrative   Patient is married [name]) and lives at home with her husband.   Patient has one daughter  Patient drinks very little caffeine.   Left handed   Social Determinants of Health   Financial Resource Strain: Low Risk   . Difficulty of Paying Living Expenses: Not hard at all  Food Insecurity: No Food Insecurity  . Worried About Charity fundraiser in the Last Year: Never true  . Ran Out of Food in the Last Year: Never true  Transportation Needs: No Transportation Needs  . Lack of Transportation (Medical): No  . Lack of Transportation (Non-Medical): No  Physical Activity: Not on file  Stress: Not on file  Social Connections: Not on file   Past Surgical History:  Procedure Laterality Date  . ANAL SPHINCTEROTOMY  04/2011  . APPENDECTOMY  1980  . AXILLARY LYMPH NODE DISSECTION Left 02/04/2013   Procedure: LEFT AXILLARY LYMPH NODE DISSECTION;  Surgeon: Stark Klein, MD;  Location: [place];  Service: General;  Laterality: Left;  End: 5686  . BREAST LUMPECTOMY WITH NEEDLE LOCALIZATION Left 02/04/2013   Procedure: LEFT BREAST LUMPECTOMY WITH NEEDLE LOCALIZATION;  Surgeon: Stark Klein, MD;  Location: [place];  Service: General;  Laterality: Left;  .  BREAST SURGERY     Lumpectomy in april 2014  . HEMORRHOID SURGERY  04/2011   ligation  . MASTECTOMY Left 02/15/2017  . PORT-A-CATH REMOVAL N/A 04/16/2014   Procedure: REMOVAL PORT-A-CATH;  Surgeon: Stark Klein, MD;  Location: [facility];  Service: General;  Laterality: N/A;  . PORTACATH PLACEMENT Right 02/04/2013   Procedure: INSERTION PORT-A-CATH;  Surgeon: Stark Klein, MD;  Location: [facility];  Service: General;  Laterality: Right;  Start Time: 1683.  Marland Kitchen SHOULDER ARTHROSCOPY WITH ROTATOR CUFF REPAIR AND SUBACROMIAL DECOMPRESSION Left 02/24/2014   Procedure: SHOULDER ARTHROSCOPY WITH ROTATOR CUFF REPAIR AND SUBACROMIAL DECOMPRESSION;  Surgeon: Meredith Pel, MD;  Location: [facility];  Service: Orthopedics;  Laterality: Left;  LEFT SHOULDER DIAGNOSTIC OPERATIVE ARTHROSCOPY, SUBACROMIAL DECOMPRESSION, ROTATOR CUFF TEAR REPAIR  . SIMPLE MASTECTOMY WITH AXILLARY SENTINEL NODE BIOPSY Left 02/15/2017   Procedure: LEFT MASTECTOMY;  Surgeon: Stark Klein, MD;  Location: [place];  Service: General;  Laterality: Left;  . TOTAL MASTECTOMY Right 12/26/2018   Procedure: RIGHT BREAST PROPHYLATIC MASTECTOMY;  Surgeon: Stark Klein, MD;  Location: [place];  Service: General;  Laterality: Right;   Past Surgical History:  Procedure Laterality Date  . ANAL SPHINCTEROTOMY  04/2011  . APPENDECTOMY  1980  . AXILLARY LYMPH NODE DISSECTION Left 02/04/2013   Procedure: LEFT AXILLARY LYMPH NODE DISSECTION;  Surgeon: Stark Klein, MD;  Location: [place];  Service: General;  Laterality: Left;  End: 7290  . BREAST LUMPECTOMY WITH NEEDLE LOCALIZATION Left 02/04/2013   Procedure: LEFT BREAST LUMPECTOMY WITH NEEDLE LOCALIZATION;  Surgeon: Stark Klein, MD;  Location: [facility];  Service: General;  Laterality: Left;  . BREAST SURGERY     Lumpectomy in april 2014  . HEMORRHOID SURGERY  04/2011   ligation  . MASTECTOMY Left 02/15/2017  . PORT-A-CATH REMOVAL N/A 04/16/2014   Procedure: REMOVAL PORT-A-CATH;  Surgeon: Stark Klein, MD;  [facility];  Service: General;  Laterality: N/A;  . PORTACATH PLACEMENT Right 02/04/2013   Procedure: INSERTION PORT-A-CATH;  Surgeon: Stark Klein, MD;  Location: [place];  Service: General;  Laterality: Right;  Start Time: 2111.  Marland Kitchen SHOULDER ARTHROSCOPY WITH ROTATOR CUFF REPAIR AND SUBACROMIAL DECOMPRESSION Left 02/24/2014   Procedure: SHOULDER ARTHROSCOPY WITH ROTATOR CUFF REPAIR AND SUBACROMIAL DECOMPRESSION;  Surgeon: Meredith Pel, MD;  Location: [place];  Service: Orthopedics;  Laterality: Left;  LEFT SHOULDER DIAGNOSTIC  OPERATIVE ARTHROSCOPY, SUBACROMIAL DECOMPRESSION, ROTATOR CUFF TEAR REPAIR  . SIMPLE MASTECTOMY WITH AXILLARY SENTINEL NODE BIOPSY Left 02/15/2017   Procedure: LEFT MASTECTOMY;  Surgeon: Stark Klein, MD;  Location: [place];  Service: General;  Laterality: Left;  . TOTAL MASTECTOMY Right 12/26/2018   Procedure: RIGHT BREAST PROPHYLATIC MASTECTOMY;  Surgeon: Stark Klein, MD;  Location: [facility];  Service: General;  Laterality: Right;   Past Medical History:  Diagnosis Date  . Acute blood loss anemia   . Anal fissure 05/03/2011  . Anemia    Iron deficinecy anemia  . Anemia 05/14/2017  . Anorectal pain 04/12/2013  . Anxiety   . Anxiety and depression 05/15/2014  . Anxiety state 05/15/2014  . Arthritis   . Breast cancer ([place])    left ,last radiation 2'15, last chemo 8'14  . Cervical cancer screening 06/18/2018   Menarche at 11 Regular and moderate flow  history of abnormal pap in past, 1 mild abnormality years ago that resolved spontaneously on recheck G1P1, s/p 1 svd history of abnormal MGM, b/o breast cancer 2014 No concerns today  gyn surgeries. Lumpectomy  LMP around early 2014  . Constipation 11/13/2016  . Contracture of axilla 05/20/2013  . Debility 01/18/2020  . Depression   . Flushing 02/20/2014  . Genetic testing 02/20/2019   Negative genetic testing on the Comprehensive Cancer Panel.  The Comprehensive Common Cancer Panel offered by [facility] includes sequencing and/or deletion  duplication testing of the following 46 genes: APC, ATM, AXIN2, BAP1, BARD1, BMPR1A, BRCA1, BRCA2, BRIP1, CDH1, CDK4, CDKN2A, CHEK2, EPCAM, FANCC, FH, FLCN, HOXB13, MET, MITF,  MLH1, MSH2, MSH6, MUTYH, NBN, NF1, NTHL1,  PALB2, PMS2, POLD1, POLE, P  . H/O: CVA (cerebrovascular accident) 06/19/2020  . Hereditary and idiopathic peripheral neuropathy 11/11/2013  . Herpes zoster 05/15/2014  . History of chicken pox   . History of left breast cancer 02/15/2017  . History of radiation therapy 09/09/13-10/28/13   45 gray to left breast, lumpectomy cavity boosted to 63 gray  . HTN (hypertension) 11/13/2016  . Hyperglycemia 01/09/2016  . Hyperlipidemia 05/14/2017  . Hypertension   . Internal hemorrhoids with complication 1/61/0960  . Knee pain, bilateral 11/14/2016  . Left upper extremity numbness 06/19/2018  . Major depressive disorder, recurrent episode, severe ([place]) 01/12/2014  . Malignant neoplasm of upper-inner quadrant of female breast ([place]) 01/24/2013    Left IDC, ER 3%, PR-, Her2neu-  Formatting of this note might be different from the original. Overview:   Left IDC, ER 3%, PR-, Her2neu-  Last Assessment & Plan:  Pt will continue to take her tamoxifen as previously directed. She appears to be tolerating this with only c/o moderate, occ hot flashes.  . Mild cognitive impairment 08/24/2019  . Mild neurocognitive disorder due to multiple etiologies 03/17/2020  . MRSA (methicillin resistant Staphylococcus aureus) 2009   right groin area-no issues now. 04-07-14 PCR screen negative today.  . Neuropathy   . Neuropathy of hand, left 09/21/2015  . Nodule of finger of left hand 11/14/2016  . Palpitations 07/30/2020  . Preventative health care 11/13/2016  . Right hip pain 11/14/2016  . S/P mastectomy, right 12/26/2018  . Sebaceous cyst 04/12/2013  . Shoulder joint pain 12/12/2013  . Strain of rotator cuff 02/24/2014  . Stroke ([place]) 06/2020  . Vertigo    BP 104/66   Pulse 68   Temp 99 F (37.2 C)   Ht 5' 2"  (1.575 m)   Wt  151 lb (68.5 kg)   LMP 01/20/2013  SpO2 98%   BMI 27.62 kg/m   Opioid Risk Score:   Fall Risk Score:  `1  Depression screen PHQ 2/9  Depression screen [facility] 2/9 11/10/2020 06/30/2020 05/26/2020 05/05/2020 05/02/2019 04/30/2018 10/12/2016  Decreased Interest 1 1 0 2 0 0 0  Down, Depressed, Hopeless 1 1 0 1 0 1 0  PHQ - 2 Score 2 2 0 3 0 1 0  Altered sleeping - - - 2 - - -  Tired, decreased energy - - - 3 - - -  Change in appetite - - - 2 - - -  Feeling bad or failure about yourself  - - - 1 - - -  Trouble concentrating - - - 1 - - -  Moving slowly or fidgety/restless - - - 1 - - -  Suicidal thoughts - - - 0 - - -  PHQ-9 Score - - - 13 - - -  Some encounter information is confidential and restricted. Go to Review Flowsheets activity to see all data.  Some recent data might be hidden    Review of Systems  Constitutional: Negative.   HENT: Negative.   Eyes: Negative.   Respiratory: Negative.   Cardiovascular: Negative.   Gastrointestinal: Negative.   Endocrine: Negative.   Genitourinary: Negative.   Musculoskeletal: Positive for arthralgias.       Hand and feet pain  Skin: Negative.   Neurological:       Tingling   Hematological: Negative.   Psychiatric/Behavioral: Negative.   All other systems reviewed and are negative.      Objective:   Physical Exam Vitals and nursing note reviewed.  Constitutional:      Appearance: Normal appearance.  Cardiovascular:     Rate and Rhythm: Normal rate and regular rhythm.     Pulses: Normal pulses.     Heart sounds: Normal heart sounds.  Pulmonary:     Effort: Pulmonary effort is normal.     Breath sounds: Normal breath sounds.  Musculoskeletal:     Cervical back: Normal range of motion and neck supple.     Comments: Normal Muscle Bulk and Muscle Testing Reveals:  Upper Extremities:Full  ROM and Muscle Strength 5/5  Lower Extremities: Full ROM and Muscle Strength 5/5 Arises from table with ease Narrow Based Gait   Skin:     General: Skin is warm and dry.  Neurological:     Mental Status: She is alert and oriented to person, place, and time.  Psychiatric:        Mood and Affect: Mood normal.        Behavior: Behavior normal.           Assessment & Plan:  1. Chemotherapy Induced Peripheral Neuropathy: Continue current medication regimen with Gabapentin. Continue to Monitor.  02/23/21 2. Chronic Pain Syndrome: Refilled Hydrocodone 10/325 mg one tablet three times a day as needed. #90. 02/23/2021 We will continue the opioid monitoring program, this consists of regular clinic visits, examinations, urine drug screen, pill counts as well as use of [place] Controlled Substance Reporting system. A 12 month History has been reviewed on the [place] Controlled Substance Reporting System on 02/23/2021  F/U in 1 month with Dr Ranell Patrick

## 2021-03-08 ENCOUNTER — Ambulatory Visit: Payer: Medicare Other | Admitting: Family Medicine

## 2021-03-08 ENCOUNTER — Encounter: Payer: Self-pay | Admitting: Family Medicine

## 2021-03-08 ENCOUNTER — Other Ambulatory Visit: Payer: Self-pay

## 2021-03-08 ENCOUNTER — Ambulatory Visit (INDEPENDENT_AMBULATORY_CARE_PROVIDER_SITE_OTHER): Payer: Medicare Other | Admitting: Family Medicine

## 2021-03-08 VITALS — BP 102/62 | HR 66 | Temp 98.5°F | Resp 16 | Ht 62.0 in | Wt 144.2 lb

## 2021-03-08 DIAGNOSIS — Z780 Asymptomatic menopausal state: Secondary | ICD-10-CM | POA: Diagnosis not present

## 2021-03-08 DIAGNOSIS — Z0000 Encounter for general adult medical examination without abnormal findings: Secondary | ICD-10-CM

## 2021-03-08 DIAGNOSIS — F419 Anxiety disorder, unspecified: Secondary | ICD-10-CM

## 2021-03-08 DIAGNOSIS — R739 Hyperglycemia, unspecified: Secondary | ICD-10-CM | POA: Diagnosis not present

## 2021-03-08 DIAGNOSIS — E785 Hyperlipidemia, unspecified: Secondary | ICD-10-CM

## 2021-03-08 DIAGNOSIS — E2839 Other primary ovarian failure: Secondary | ICD-10-CM

## 2021-03-08 DIAGNOSIS — F32A Depression, unspecified: Secondary | ICD-10-CM | POA: Diagnosis not present

## 2021-03-08 DIAGNOSIS — I10 Essential (primary) hypertension: Secondary | ICD-10-CM | POA: Diagnosis not present

## 2021-03-08 NOTE — Patient Instructions (Addendum)
Recommend calcium intake of 1200 to 1500 mg daily, divided into roughly 3 doses. Best source is the diet and a single dairy serving is about 500 mg, a supplement of calcium citrate once or twice daily to balance diet is fine if not getting enough in diet. Also need Vitamin D 2000 IU caps, 1 cap daily if not already taking vitamin D. Also recommend weight baring exercise on hips and upper body to keep bones strong  Shingrix is the new shingles shot, 2 shots over 2-6 months, confirm coverage with insurance and document, then can ret Preventive Care 65 Years and Older, Female Preventive care refers to lifestyle choices and visits with your health care provider that can promote health and wellness. This includes:  A yearly physical exam. This is also called an annual wellness visit.  Regular dental and eye exams.  Immunizations.  Screening for certain conditions.  Healthy lifestyle choices, such as: ? Eating a healthy diet. ? Getting regular exercise. ? Not using drugs or products that contain nicotine and tobacco. ? Limiting alcohol use. What can I expect for my preventive care visit? Physical exam Your health care provider will check your:  Height and weight. These may be used to calculate your BMI (body mass index). BMI is a measurement that tells if you are at a healthy weight.  Heart rate and blood pressure.  Body temperature.  Skin for abnormal spots. Counseling Your health care provider may ask you questions about your:  Past medical problems.  Family's medical history.  Alcohol, tobacco, and drug use.  Emotional well-being.  Home life and relationship well-being.  Sexual activity.  Diet, exercise, and sleep habits.  History of falls.  Memory and ability to understand (cognition).  Work and work Statistician.  Pregnancy and menstrual history.  Access to firearms. What immunizations do I need? Vaccines are usually given at various ages, according to a  schedule. Your health care provider will recommend vaccines for you based on your age, medical history, and lifestyle or other factors, such as travel or where you work.   What tests do I need? Blood tests  Lipid and cholesterol levels. These may be checked every 5 years, or more often depending on your overall health.  Hepatitis C test.  Hepatitis B test. Screening  Lung cancer screening. You may have this screening every year starting at age 51 if you have a 30-pack-year history of smoking and currently smoke or have quit within the past 15 years.  Colorectal cancer screening. ? All adults should have this screening starting at age 15 and continuing until age 84. ? Your health care provider may recommend screening at age 89 if you are at increased risk. ? You will have tests every 1-10 years, depending on your results and the type of screening test.  Diabetes screening. ? This is done by checking your blood sugar (glucose) after you have not eaten for a while (fasting). ? You may have this done every 1-3 years.  Mammogram. ? This may be done every 1-2 years. ? Talk with your health care provider about how often you should have regular mammograms.  Abdominal aortic aneurysm (AAA) screening. You may need this if you are a current or former smoker.  BRCA-related cancer screening. This may be done if you have a family history of breast, ovarian, tubal, or peritoneal cancers. Other tests  STD (sexually transmitted disease) testing, if you are at risk.  Bone density scan. This is done to screen for  osteoporosis. You may have this done starting at age 61. Talk with your health care provider about your test results, treatment options, and if necessary, the need for more tests. Follow these instructions at home: Eating and drinking  Eat a diet that includes fresh fruits and vegetables, whole grains, lean protein, and low-fat dairy products. Limit your intake of foods with high amounts  of sugar, saturated fats, and salt.  Take vitamin and mineral supplements as recommended by your health care provider.  Do not drink alcohol if your health care provider tells you not to drink.  If you drink alcohol: ? Limit how much you have to 0-1 drink a day. ? Be aware of how much alcohol is in your drink. In the [place] one drink equals one 12 oz bottle of beer (355 mL), one 5 oz glass of wine (148 mL), or one 1 oz glass of hard liquor (44 mL).   Lifestyle  Take daily care of your teeth and gums. Brush your teeth every morning and night with fluoride toothpaste. Floss one time each day.  Stay active. Exercise for at least 30 minutes 5 or more days each week.  Do not use any products that contain nicotine or tobacco, such as cigarettes, e-cigarettes, and chewing tobacco. If you need help quitting, ask your health care provider.  Do not use drugs.  If you are sexually active, practice safe sex. Use a condom or other form of protection in order to prevent STIs (sexually transmitted infections).  Talk with your health care provider about taking a low-dose aspirin or statin.  Find healthy ways to cope with stress, such as: ? Meditation, yoga, or listening to music. ? Journaling. ? Talking to a trusted person. ? Spending time with friends and family. Safety  Always wear your seat belt while driving or riding in a vehicle.  Do not drive: ? If you have been drinking alcohol. Do not ride with someone who has been drinking. ? When you are tired or distracted. ? While texting.  Wear a helmet and other protective equipment during sports activities.  If you have firearms in your house, make sure you follow all gun safety procedures. What's next?  Visit your health care provider once a year for an annual wellness visit.  Ask your health care provider how often you should have your eyes and teeth checked.  Stay up to date on all vaccines. This information is not intended to  replace advice given to you by your health care provider. Make sure you discuss any questions you have with your health care provider. Document Revised: 10/13/2020 Document Reviewed: 10/17/2018 Elsevier Patient Education  2021 [place]. urn here for shots with nurse appt or at pharmacy

## 2021-03-08 NOTE — Assessment & Plan Note (Addendum)
Patient encouraged to maintain heart healthy diet, regular exercise, adequate sleep. Consider daily probiotics. Take medications as prescribed. Labs ordered and reviewed. She agrees to her first Dexa scan. She declines Mmmogram, pneumonia shots, Hep C testing and Tdap today. Last colonoscopy in 2016 repeat in 2026

## 2021-03-08 NOTE — Assessment & Plan Note (Signed)
Well controlled, no changes to meds. Encouraged heart healthy diet such as the DASH diet and exercise as tolerated.  °

## 2021-03-08 NOTE — Assessment & Plan Note (Signed)
hgba1c acceptable, minimize simple carbs. Increase exercise as tolerated.  

## 2021-03-08 NOTE — Progress Notes (Signed)
Patient ID: Kristi [name], female    DOB: April 27, 1956  Age: 65 y.o. MRN: [id number]    Subjective:  Subjective  HPI Kristi [name] presents for office visit today. She reports that she is feeling well and has no recent illnesses or ER visits. She denies any chest pain, SOB, fever, abdominal pain, cough, chills, sore throat, dysuria, urinary incontinence, back pain, HA, or N/VD. She endorses taking multivitamin supplements and having cheese in her diet. She states that she has taken her 4th COVID-19 booster earlier due to her health risks. She states that she walks daily for physical exercise. She states that her brother passed away at 61 y/o in January 11, 2019 due to smoking.    Review of Systems  Constitutional: Negative for chills, fatigue and fever.  HENT: Negative for congestion, facial swelling, rhinorrhea, sinus pressure, sinus pain, sore throat and trouble swallowing.   Eyes: Negative for pain.  Respiratory: Negative for cough and shortness of breath.   Cardiovascular: Negative for chest pain, palpitations and leg swelling.  Gastrointestinal: Negative for abdominal pain, blood in stool, diarrhea, nausea and vomiting.  Genitourinary: Negative for decreased urine volume, flank pain, frequency, vaginal bleeding and vaginal discharge.  Musculoskeletal: Negative for back pain.  Neurological: Negative for headaches.    History Past Medical History:  Diagnosis Date  . Acute blood loss anemia   . Anal fissure 05/03/2011  . Anemia    Iron deficinecy anemia  . Anemia 05/14/2017  . Anorectal pain 04/12/2013  . Anxiety   . Anxiety and depression 05/15/2014  . Anxiety state 05/15/2014  . Arthritis   . Breast cancer ([place])    left ,last radiation 2'15, last chemo 8'14  . Cervical cancer screening 06/18/2018   Menarche at 11 Regular and moderate flow  history of abnormal pap in past, 1 mild abnormality years ago that resolved spontaneously on recheck G1P1, s/p 1 svd history of abnormal MGM, b/o breast cancer  2013/01/11 No concerns today  gyn surgeries. Lumpectomy  LMP around early 2013/01/11  . Constipation 11/13/2016  . Contracture of axilla 05/20/2013  . Debility 01/18/2020  . Depression   . Flushing 02/20/2014  . Genetic testing 02/20/2019   Negative genetic testing on the Comprehensive Cancer Panel.  The Comprehensive Common Cancer Panel offered by [facility] includes sequencing and/or deletion duplication testing of the following 46 genes: APC, ATM, AXIN2, BAP1, BARD1, BMPR1A, BRCA1, BRCA2, BRIP1, CDH1, CDK4, CDKN2A, CHEK2, EPCAM, FANCC, FH, FLCN, HOXB13, MET, MITF,  MLH1, MSH2, MSH6, MUTYH, NBN, NF1, NTHL1,  PALB2, PMS2, POLD1, POLE, P  . H/O: CVA (cerebrovascular accident) 06/19/2020  . Hereditary and idiopathic peripheral neuropathy 11/11/2013  . Herpes zoster 05/15/2014  . History of chicken pox   . History of left breast cancer 02/15/2017  . History of radiation therapy 09/09/13-10/28/13   45 gray to left breast, lumpectomy cavity boosted to 63 gray  . HTN (hypertension) 11/13/2016  . Hyperglycemia 01/09/2016  . Hyperlipidemia 05/14/2017  . Hypertension   . Internal hemorrhoids with complication 5/46/5681  . Knee pain, bilateral 11/14/2016  . Left upper extremity numbness 06/19/2018  . Major depressive disorder, recurrent episode, severe ([place]) 01/12/2014  . Malignant neoplasm of upper-inner quadrant of female breast ([place]) 01/24/2013    Left IDC, ER 3%, PR-, Her2neu-  Formatting of this note might be different from the original. Overview:   Left IDC, ER 3%, PR-, Her2neu-  Last Assessment & Plan:  Pt will continue to take her tamoxifen as previously directed. She appears to  be tolerating this with only c/o moderate, occ hot flashes.  . Mild cognitive impairment 08/24/2019  . Mild neurocognitive disorder due to multiple etiologies 03/17/2020  . MRSA (methicillin resistant Staphylococcus aureus) 2009   right groin area-no issues now. 04-07-14 PCR screen negative today.  . Neuropathy   . Neuropathy of hand, left 09/21/2015  .  Nodule of finger of left hand 11/14/2016  . Palpitations 07/30/2020  . Preventative health care 11/13/2016  . Right hip pain 11/14/2016  . S/P mastectomy, right 12/26/2018  . Sebaceous cyst 04/12/2013  . Shoulder joint pain 12/12/2013  . Strain of rotator cuff 02/24/2014  . Stroke ([place]) 06/2020  . Vertigo     She has a past surgical history that includes Anal sphincterotomy (04/2011); Hemorrhoid surgery (04/2011); Breast lumpectomy with needle localization (Left, 02/04/2013); Axillary lymph node dissection (Left, 02/04/2013); Portacath placement (Right, 02/04/2013); Appendectomy (1980); Breast surgery; Shoulder arthroscopy with rotator cuff repair and subacromial decompression (Left, 02/24/2014); Port-a-cath removal (N/A, 04/16/2014); Mastectomy (Left, 02/15/2017); Simple mastectomy with axillary sentinel node biopsy (Left, 02/15/2017); and Total mastectomy (Right, 12/26/2018).   Her family history includes Alcohol abuse in her brother; Arthritis in her daughter; Breast cancer in her cousin and cousin; Breast cancer (age of onset: 38) in her paternal aunt; COPD in her brother; Cancer in her cousin, father, paternal uncle, and sister; Cervical cancer in her paternal aunt; Colon cancer in her paternal aunt; Dementia in her mother; Diabetes in her mother; Hypertension in her brother, father, and mother; Kidney cancer in her paternal grandmother; Lung cancer in her father; Memory loss in her mother; Ovarian cancer in her cousin; Seizures in her brother; Thyroid cancer in her father.She reports that she has never smoked. She has never used smokeless tobacco. She reports that she does not drink alcohol and does not use drugs.  Current Outpatient Medications on File Prior to Visit  Medication Sig Dispense Refill  . anastrozole (ARIMIDEX) 1 MG tablet Take 1 tablet (1 mg total) by mouth daily. TAKE 1 TABLET (1 MG) BY MOUTH DAILY IN THE MORNING 90 tablet 3  . Apoaequorin (PREVAGEN) 10 MG CAPS Take 1 capsule by mouth daily.    Marland Kitchen  atorvastatin (LIPITOR) 40 MG tablet Take 1 tablet (40 mg total) by mouth daily. 90 tablet 1  . gabapentin (NEURONTIN) 300 MG capsule Take 3 capsules (900 mg total) by mouth at bedtime. 90 capsule 3  . gabapentin (NEURONTIN) 600 MG tablet Take 1 tablet (600 mg total) by mouth 2 (two) times daily. 60 tablet 3  . HYDROcodone-acetaminophen (NORCO) 10-325 MG tablet Take 1 tablet by mouth 3 (three) times daily as needed. 90 tablet 0  . hydrOXYzine (ATARAX/VISTARIL) 50 MG tablet Take 1 tablet (50 mg total) by mouth 2 (two) times daily. 30 tablet 0  . lurasidone (LATUDA) 40 MG TABS tablet 1 tablet with food    . Multiple Vitamin (MULTIVITAMIN WITH MINERALS) TABS tablet Take 1 tablet by mouth daily.    . polyethylene glycol (MIRALAX / GLYCOLAX) 17 g packet Take 17 g by mouth daily as needed for mild constipation. 14 each 0  . traZODone (DESYREL) 50 MG tablet Take 1 tablet (50 mg total) by mouth at bedtime. 30 tablet 0  . Turmeric (QC TUMERIC COMPLEX PO) Take by mouth.    . valsartan-hydrochlorothiazide (DIOVAN-HCT) 160-12.5 MG tablet Take 1 tablet by mouth daily. 90 tablet 1  . [DISCONTINUED] prochlorperazine (COMPAZINE) 10 MG tablet Take 1 tablet (10 mg total) by mouth every 6 (six) hours  as needed (Nausea or vomiting). 30 tablet 1  . [DISCONTINUED] prochlorperazine (COMPAZINE) 25 MG suppository Place 1 suppository (25 mg total) rectally every 12 (twelve) hours as needed for nausea. 12 suppository 3   No current facility-administered medications on file prior to visit.     Objective:  Objective  Physical Exam Constitutional:      General: She is not in acute distress.    Appearance: Normal appearance. She is not ill-appearing or toxic-appearing.  HENT:     Head: Normocephalic and atraumatic.     Right Ear: Tympanic membrane, ear canal and external ear normal.     Left Ear: Tympanic membrane, ear canal and external ear normal.     Nose: No congestion or rhinorrhea.  Eyes:     Extraocular  Movements: Extraocular movements intact.     Pupils: Pupils are equal, round, and reactive to light.  Cardiovascular:     Rate and Rhythm: Normal rate and regular rhythm.     Pulses: Normal pulses.     Heart sounds: Normal heart sounds. No murmur heard.   Pulmonary:     Effort: Pulmonary effort is normal. No respiratory distress.     Breath sounds: Normal breath sounds. No wheezing, rhonchi or rales.  Abdominal:     General: Bowel sounds are normal.     Palpations: Abdomen is soft. There is no mass.     Tenderness: There is no abdominal tenderness. There is no guarding.     Hernia: No hernia is present.  Musculoskeletal:        General: Normal range of motion.     Cervical back: Normal range of motion and neck supple.  Skin:    General: Skin is warm and dry.  Neurological:     Mental Status: She is alert and oriented to person, place, and time.  Psychiatric:        Behavior: Behavior normal.    BP 102/62   Pulse 66   Temp 98.5 F (36.9 C)   Resp 16   Ht 5' 2"  (1.575 m)   Wt 144 lb 3.2 oz (65.4 kg)   LMP 01/20/2013   SpO2 97%   BMI 26.37 kg/m  Wt Readings from Last 3 Encounters:  03/08/21 144 lb 3.2 oz (65.4 kg)  02/23/21 151 lb (68.5 kg)  01/25/21 150 lb (68 kg)     Lab Results  Component Value Date   WBC 5.6 06/28/2020   HGB 11.0 (L) 06/28/2020   HCT 33.1 (L) 06/28/2020   PLT 208 06/28/2020   GLUCOSE 95 06/28/2020   CHOL 144 06/16/2020   TRIG 68 06/16/2020   HDL 41 06/16/2020   LDLCALC 89 06/16/2020   ALT 13 06/28/2020   AST 17 06/28/2020   NA 140 06/28/2020   K 4.7 06/28/2020   CL 101 06/28/2020   CREATININE 1.27 (H) 06/28/2020   BUN 13 06/28/2020   CO2 31 06/28/2020   TSH 1.32 01/21/2020   INR 1.0 06/15/2020   HGBA1C 5.9 (H) 06/16/2020    No results found.   Assessment & Plan:  Plan    No orders of the defined types were placed in this encounter.   Problem List Items Addressed This Visit    Anxiety and depression    She is on Latuda,  Trazodone and Hydroxyzine prn      Hyperglycemia - Primary    hgba1c acceptable, minimize simple carbs. Increase exercise as tolerated.      Preventative health care  Patient encouraged to maintain heart healthy diet, regular exercise, adequate sleep. Consider daily probiotics. Take medications as prescribed. Labs ordered and reviewed. She agrees to her first Dexa scan. She declines Mmmogram, pneumonia shots, Hep C testing and Tdap today. Last colonoscopy in 2016 repeat in 2026      Relevant Orders   CBC with Differential/Platelet   Comprehensive metabolic panel   Lipid panel   TSH   HTN (hypertension)    Well controlled, no changes to meds. Encouraged heart healthy diet such as the DASH diet and exercise as tolerated.       Hyperlipidemia    Tolerating statin, encouraged heart healthy diet, avoid trans fats, minimize simple carbs and saturated fats. Increase exercise as tolerated      Relevant Orders   CBC with Differential/Platelet   Comprehensive metabolic panel   Lipid panel   TSH    Other Visit Diagnoses    Essential hypertension       Relevant Orders   CBC with Differential/Platelet   Comprehensive metabolic panel   Lipid panel   TSH   Post-menopause       Relevant Orders   DG Bone Density   Estrogen deficiency       Relevant Orders   DG Bone Density      Follow-up: Return in about 6 months (around 09/08/2021).   I,David Hanna,acting as a scribe for [name], MD.,have documented all relevant documentation on the behalf of [name], MD,as directed by  [name], MD while in the presence of [name], MD.  I, Mosie Lukes, MD personally performed the services described in this documentation. All medical record entries made by the scribe were at my direction and in my presence. I have reviewed the chart and agree that the record reflects my personal performance and is accurate and complete

## 2021-03-08 NOTE — Assessment & Plan Note (Signed)
Tolerating statin, encouraged heart healthy diet, avoid trans fats, minimize simple carbs and saturated fats. Increase exercise as tolerated 

## 2021-03-08 NOTE — Assessment & Plan Note (Signed)
She is on Latuda, Trazodone and Hydroxyzine prn

## 2021-03-09 LAB — COMPREHENSIVE METABOLIC PANEL
ALT: 18 U/L (ref 0–35)
AST: 22 U/L (ref 0–37)
Albumin: 4.3 g/dL (ref 3.5–5.2)
Alkaline Phosphatase: 98 U/L (ref 39–117)
BUN: 14 mg/dL (ref 6–23)
CO2: 34 mEq/L — ABNORMAL HIGH (ref 19–32)
Calcium: 10.5 mg/dL (ref 8.4–10.5)
Chloride: 102 mEq/L (ref 96–112)
Creatinine, Ser: 1.07 mg/dL (ref 0.40–1.20)
GFR: 54.28 mL/min — ABNORMAL LOW (ref >60.00)
Glucose, Bld: 83 mg/dL (ref 70–99)
Potassium: 4.4 mEq/L (ref 3.5–5.1)
Sodium: 141 mEq/L (ref 135–145)
Total Bilirubin: 0.4 mg/dL (ref 0.2–1.2)
Total Protein: 6.9 g/dL (ref 6.0–8.3)

## 2021-03-09 LAB — LIPID PANEL
Cholesterol: 115 mg/dL (ref 0–200)
HDL: 51.4 mg/dL (ref >39.00)
LDL Cholesterol: 51 mg/dL (ref 0–99)
NonHDL: 63.23
Total CHOL/HDL Ratio: 2
Triglycerides: 62 mg/dL (ref 0.0–149.0)
VLDL: 12.4 mg/dL (ref 0.0–40.0)

## 2021-03-09 LAB — CBC WITH DIFFERENTIAL/PLATELET
Basophils Absolute: 0 10*3/uL (ref 0.0–0.1)
Basophils Relative: 0.4 % (ref 0.0–3.0)
Eosinophils Absolute: 0.1 10*3/uL (ref 0.0–0.7)
Eosinophils Relative: 1.9 % (ref 0.0–5.0)
HCT: 34.7 % — ABNORMAL LOW (ref 36.0–46.0)
Hemoglobin: 11.2 g/dL — ABNORMAL LOW (ref 12.0–15.0)
Lymphocytes Relative: 48.9 % — ABNORMAL HIGH (ref 12.0–46.0)
Lymphs Abs: 2.8 10*3/uL (ref 0.7–4.0)
MCHC: 32.2 g/dL (ref 30.0–36.0)
MCV: 86 fl (ref 78.0–100.0)
Monocytes Absolute: 0.4 10*3/uL (ref 0.1–1.0)
Monocytes Relative: 7.1 % (ref 3.0–12.0)
Neutro Abs: 2.4 10*3/uL (ref 1.4–7.7)
Neutrophils Relative %: 41.7 % — ABNORMAL LOW (ref 43.0–77.0)
Platelets: 171 10*3/uL (ref 150.0–400.0)
RBC: 4.04 Mil/uL (ref 3.87–5.11)
RDW: 15.3 % (ref 11.5–15.5)
WBC: 5.8 10*3/uL (ref 4.0–10.5)

## 2021-03-09 LAB — TSH: TSH: 1.89 u[IU]/mL (ref 0.35–4.50)

## 2021-03-17 ENCOUNTER — Ambulatory Visit (HOSPITAL_BASED_OUTPATIENT_CLINIC_OR_DEPARTMENT_OTHER)
Admission: RE | Admit: 2021-03-17 | Discharge: 2021-03-17 | Disposition: A | Discharge status: Home / Self Care | Payer: Medicare Other | Source: Ambulatory Visit | Attending: Family Medicine | Admitting: Family Medicine

## 2021-03-17 ENCOUNTER — Other Ambulatory Visit: Payer: Self-pay

## 2021-03-17 DIAGNOSIS — E2839 Other primary ovarian failure: Secondary | ICD-10-CM | POA: Insufficient documentation

## 2021-03-17 DIAGNOSIS — Z780 Asymptomatic menopausal state: Secondary | ICD-10-CM | POA: Insufficient documentation

## 2021-03-17 DIAGNOSIS — Z13820 Encounter for screening for osteoporosis: Secondary | ICD-10-CM | POA: Diagnosis not present

## 2021-03-23 ENCOUNTER — Other Ambulatory Visit: Payer: Self-pay

## 2021-03-23 ENCOUNTER — Encounter
Payer: Medicare Other | Attending: Physical Medicine and Rehabilitation | Admitting: Physical Medicine and Rehabilitation

## 2021-03-23 VITALS — BP 89/57 | HR 73 | Temp 98.9°F | Ht 62.0 in | Wt 152.2 lb

## 2021-03-23 DIAGNOSIS — G620 Drug-induced polyneuropathy: Secondary | ICD-10-CM | POA: Diagnosis not present

## 2021-03-23 DIAGNOSIS — I63542 Cerebral infarction due to unspecified occlusion or stenosis of left cerebellar artery: Secondary | ICD-10-CM | POA: Insufficient documentation

## 2021-03-23 DIAGNOSIS — T451X5A Adverse effect of antineoplastic and immunosuppressive drugs, initial encounter: Secondary | ICD-10-CM | POA: Insufficient documentation

## 2021-03-23 DIAGNOSIS — I890 Lymphedema, not elsewhere classified: Secondary | ICD-10-CM | POA: Diagnosis not present

## 2021-03-23 MED ORDER — GABAPENTIN 600 MG PO TABS: 600.0000 mg | ORAL_TABLET | Freq: Two times a day (BID) | ORAL | 3 refills | Status: DC

## 2021-03-23 MED ORDER — GABAPENTIN 300 MG PO CAPS: 900.0000 mg | ORAL_CAPSULE | Freq: Every day | ORAL | 3 refills | Status: DC

## 2021-03-23 MED ORDER — HYDROCODONE-ACETAMINOPHEN 10-325 MG PO TABS: 1.0000 | ORAL_TABLET | Freq: Three times a day (TID) | ORAL | 0 refills | Status: DC | PRN

## 2021-03-23 MED ORDER — VITAMIN B-6 50 MG PO TABS: 50.0000 mg | ORAL_TABLET | Freq: Every day | ORAL | 3 refills | Status: DC

## 2021-03-23 NOTE — Patient Instructions (Signed)
HTN:  -Advised regarding healthy foods that can help lower blood pressure and provided with a list: 1) citrus foods- high in vitamins and minerals 2) salmon and other fatty fish - reduces inflammation and oxylipins 3) swiss chard (leafy green)- high level of nitrates 4) pumpkin seeds- one of the best natural sources of magnesium 5) Beans and lentils- high in fiber, magnesium, and potassium 6) Berries- high in flavonoids 7) Amaranth (whole grain, can be cooked similarly to rice and oats)- high in magnesium and fiber 8) Pistachios- even more effective at reducing BP than other nuts 9) Carrots- high in phenolic compounds that relax blood vessels and reduce inflammation 10) Celery- contain phthalides that relax tissues of arterial walls 11) Tomatoes- can also improve cholesterol and reduce risk of heart disease 12) Broccoli- good source of magnesium, calcium, and potassium 13) Greek yogurt: high in potassium and calcium 14) Herbs and spices: Celery seed, cilantro, saffron, lemongrass, black cumin, ginseng, cinnamon, cardamom, sweet basil, and ginger 15) Chia and flax seeds- also help to lower cholesterol and blood sugar 16) Beets- high levels of nitrates that relax blood vessels  17) spinach and bananas- high in potassium  -Provided lise of supplements that can help with hypertension:  1) magnesium: one high quality brand is Bioptemizers since it contains all 7 types of magnesium, otherwise over the counter magnesium gluconate 400mg is a good option 2) B vitamins 3) vitamin D 4) potassium 5) CoQ10 6) L-arginine 7) Vitamin C 8) Beetroot -Educated that goal BP is 120/80. -Made goal to incorporate some of the above foods into diet.   

## 2021-03-23 NOTE — Progress Notes (Signed)
Subjective:    Patient ID: Kristi [name], female    DOB: May 14, 1956, 65 y.o.   MRN: [id number]  HPI Kristi [name] is a 65 year old female who presents for f/u of cerebral artery stroke and chemotherapy-induced peripheral neuropathy.  1) History of left breast cancer:  -She has been taking a pill of turmeric daily.  -She does use sugar at times- educated regarding benefits of ketogenic diet to keep cancer in remission. She has been trying to eat less sugar.   2) Chemotherapy induced peripheral neuropathy:  -Her pain has been stable.  -She continues to take Norco up to three times per day.  -If she skips her middle dose she does note increased pain.  -She also continues on Gabapentin 600/600/900.  -Pain is constant -Worse with walking.   3) General health:  -She has been trying to do some resistance training every day.  -Since last visit she has increased her daily walk to 40 minutes despite the cold! She does 6-7,000 steps. She usually does this around 9-10am. She enjoys walking more than other types of exercise.  -she has been doing some resistance training but not as frequet  4) HTN:  -Her BP continues to be much better controlled. -89/71, 117/71 at home. She usually checks it in the morning.   -Soft in office today and it is usually this low on her home reads.  -She asks if she should be taking her Avapro in the morning. The highest she goes systolic is 025.   5) Vitiligo:  -She shows me a decrease in pigmentation in her left axilla, chest, abdomen, and back of her neck and asks what this may be from. -Stable  6) History of stroke: -She has followed up with neurology and cardiology and a Holter monitor has been placed. She has not been advised of any abnormal rhythms thus far.   Pain Inventory Average Pain 6 Pain Right Now 6 My pain is intermittent, burning, dull, tingling and aching  In the last 24 hours, has pain interfered with the following? General activity  3 Relation with others 1 Enjoyment of life 3 What TIME of day is your pain at its worst? morning , daytime and evening Sleep (in general) Fair  Pain is worse with: walking, sitting and standing Pain improves with: medication Relief from Meds: 7  Family History  Problem Relation Age of Onset  . Lung cancer Father   . Hypertension Father   . Thyroid cancer Father        dx in his 73s  . Cancer Father        lung, thyroid, smoker  . Breast cancer Paternal Aunt 33  . Colon cancer Paternal Aunt        dx in her 50x  . Cervical cancer Paternal Aunt        dzx in her 2s  . Ovarian cancer Cousin        dx in her lage 90s  . Breast cancer Cousin        maternal first cousin, once removed; dx in her late 18s  . Breast cancer Cousin        maternal first cousin once removed; dx in late 64s  . Hypertension Mother   . Diabetes Mother   . Dementia Mother   . Memory loss Mother   . Hypertension Brother   . Seizures Brother        Alcohol induced.  . Alcohol abuse Brother  drinker, smoker  . COPD Brother   . Cancer Paternal Uncle        oral cancer  . Kidney cancer Paternal Grandmother   . Arthritis Daughter        back surgery  . Cancer Cousin        several paternal cousins with brain cancer, leukemia, and other cancers  . Cancer Sister        stomach   Social History   Socioeconomic History  . Marital status: Married    Spouse name: Kristi [name]  . Number of children: 1  . Years of education: 27  . Highest education level: Not on file  Occupational History  . Occupation: INVENTORY Hotel manager: [employer]  . Occupation: disability no longer working 2015  Tobacco Use  . Smoking status: Never Smoker  . Smokeless tobacco: Never Used  Vaping Use  . Vaping Use: Never used  Substance and Sexual Activity  . Alcohol use: No  . Drug use: No  . Sexual activity: Yes    Comment: lives with husband, disability/retirement. [employer], no dietary  restrictions  Other Topics Concern  . Not on file  Social History Narrative   Patient is married Kristi [name]) and lives at home with her husband.   Patient has one daughter   Patient drinks very little caffeine.   Left handed   Social Determinants of Health   Financial Resource Strain: Low Risk   . Difficulty of Paying Living Expenses: Not hard at all  Food Insecurity: No Food Insecurity  . Worried About Charity fundraiser in the Last Year: Never true  . Ran Out of Food in the Last Year: Never true  Transportation Needs: No Transportation Needs  . Lack of Transportation (Medical): No  . Lack of Transportation (Non-Medical): No  Physical Activity: Not on file  Stress: Not on file  Social Connections: Not on file   Past Surgical History:  Procedure Laterality Date  . ANAL SPHINCTEROTOMY  04/2011  . APPENDECTOMY  1980  . AXILLARY LYMPH NODE DISSECTION Left 02/04/2013   Procedure: LEFT AXILLARY LYMPH NODE DISSECTION;  Surgeon: Stark Klein, MD;  Location: [facility];  Service: General;  Laterality: Left;  End: 1700  . BREAST LUMPECTOMY WITH NEEDLE LOCALIZATION Left 02/04/2013   Procedure: LEFT BREAST LUMPECTOMY WITH NEEDLE LOCALIZATION;  Surgeon: Stark Klein, MD;  Location: [facility];  Service: General;  Laterality: Left;  . BREAST SURGERY     Lumpectomy in april 2014  . HEMORRHOID SURGERY  04/2011   ligation  . MASTECTOMY Left 02/15/2017  . PORT-A-CATH REMOVAL N/A 04/16/2014   Procedure: REMOVAL PORT-A-CATH;  Surgeon: Stark Klein, MD;  Location: [facility];  Service: General;  Laterality: N/A;  . PORTACATH PLACEMENT Right 02/04/2013   Procedure: INSERTION PORT-A-CATH;  Surgeon: Stark Klein, MD;  Location: [facility];  Service: General;  Laterality: Right;  Start Time: 1749.  Marland Kitchen SHOULDER ARTHROSCOPY WITH ROTATOR CUFF REPAIR AND SUBACROMIAL DECOMPRESSION Left 02/24/2014   Procedure: SHOULDER ARTHROSCOPY WITH ROTATOR CUFF REPAIR AND SUBACROMIAL DECOMPRESSION;  Surgeon: Meredith Pel, MD;  Location: [facility];  Service: Orthopedics;  Laterality: Left;  LEFT SHOULDER DIAGNOSTIC OPERATIVE ARTHROSCOPY, SUBACROMIAL DECOMPRESSION, ROTATOR CUFF TEAR REPAIR  . SIMPLE MASTECTOMY WITH AXILLARY SENTINEL NODE BIOPSY Left 02/15/2017   Procedure: LEFT MASTECTOMY;  Surgeon: Stark Klein, MD;  Location: [facility];  Service: General;  Laterality: Left;  . TOTAL MASTECTOMY Right 12/26/2018   Procedure: RIGHT BREAST PROPHYLATIC  MASTECTOMY;  Surgeon: Stark Klein, MD;  Location: [facility];  Service: General;  Laterality: Right;   Past Surgical History:  Procedure Laterality Date  . ANAL SPHINCTEROTOMY  04/2011  . APPENDECTOMY  1980  . AXILLARY LYMPH NODE DISSECTION Left 02/04/2013   Procedure: LEFT AXILLARY LYMPH NODE DISSECTION;  Surgeon: Stark Klein, MD;  Location: [facility];  Service: General;  Laterality: Left;  End: 7829  . BREAST LUMPECTOMY WITH NEEDLE LOCALIZATION Left 02/04/2013   Procedure: LEFT BREAST LUMPECTOMY WITH NEEDLE LOCALIZATION;  Surgeon: Stark Klein, MD;  Location: [facility];  Service: General;  Laterality: Left;  . BREAST SURGERY     Lumpectomy in april 2014  . HEMORRHOID SURGERY  04/2011   ligation  . MASTECTOMY Left 02/15/2017  . PORT-A-CATH REMOVAL N/A 04/16/2014   Procedure: REMOVAL PORT-A-CATH;  Surgeon: Stark Klein, MD;  Location: [facility];  Service: General;  Laterality: N/A;  . PORTACATH PLACEMENT Right 02/04/2013   Procedure: INSERTION PORT-A-CATH;  Surgeon: Stark Klein, MD;  Location: [facility];  Service: General;  Laterality: Right;  Start Time: 5621.  Marland Kitchen SHOULDER ARTHROSCOPY WITH ROTATOR CUFF REPAIR AND SUBACROMIAL DECOMPRESSION Left 02/24/2014   Procedure: SHOULDER ARTHROSCOPY WITH ROTATOR CUFF REPAIR AND SUBACROMIAL DECOMPRESSION;  Surgeon: Meredith Pel, MD;  Location: [facility];  Service: Orthopedics;  Laterality: Left;  LEFT SHOULDER DIAGNOSTIC OPERATIVE ARTHROSCOPY, SUBACROMIAL DECOMPRESSION, ROTATOR CUFF TEAR REPAIR  . SIMPLE MASTECTOMY WITH AXILLARY SENTINEL NODE BIOPSY Left 02/15/2017   Procedure:  LEFT MASTECTOMY;  Surgeon: Stark Klein, MD;  Location: [facility];  Service: General;  Laterality: Left;  . TOTAL MASTECTOMY Right 12/26/2018   Procedure: RIGHT BREAST PROPHYLATIC MASTECTOMY;  Surgeon: Stark Klein, MD;  Location: [facility];  Service: General;  Laterality: Right;   Past Medical History:  Diagnosis Date  . Acute blood loss anemia   . Anal fissure 05/03/2011  . Anemia    Iron deficinecy anemia  . Anemia 05/14/2017  . Anorectal pain 04/12/2013  . Anxiety   . Anxiety and depression 05/15/2014  . Anxiety state 05/15/2014  . Arthritis   . Breast cancer ([place])    left ,last radiation 2'15, last chemo 8'14  . Cervical cancer screening 06/18/2018   Menarche at 11 Regular and moderate flow  history of abnormal pap in past, 1 mild abnormality years ago that resolved spontaneously on recheck G1P1, s/p 1 svd history of abnormal MGM, b/o breast cancer 2014 No concerns today  gyn surgeries. Lumpectomy  LMP around early 2014  . Constipation 11/13/2016  . Contracture of axilla 05/20/2013  . Debility 01/18/2020  . Depression   . Flushing 02/20/2014  . Genetic testing 02/20/2019   Negative genetic testing on the Comprehensive Cancer Panel.  The Comprehensive Common Cancer Panel offered by [facility] includes sequencing and/or deletion duplication testing of the following 46 genes: APC, ATM, AXIN2, BAP1, BARD1, BMPR1A, BRCA1, BRCA2, BRIP1, CDH1, CDK4, CDKN2A, CHEK2, EPCAM, FANCC, FH, FLCN, HOXB13, MET, MITF,  MLH1, MSH2, MSH6, MUTYH, NBN, NF1, NTHL1,  PALB2, PMS2, POLD1, POLE, P  . H/O: CVA (cerebrovascular accident) 06/19/2020  . Hereditary and idiopathic peripheral neuropathy 11/11/2013  . Herpes zoster 05/15/2014  . History of chicken pox   . History of left breast cancer 02/15/2017  . History of radiation therapy 09/09/13-10/28/13   45 gray to left breast, lumpectomy cavity boosted to 63 gray  . HTN (hypertension) 11/13/2016  . Hyperglycemia 01/09/2016  . Hyperlipidemia 05/14/2017  . Hypertension   . Internal  hemorrhoids with complication 01/11/6577  . Knee  pain, bilateral 11/14/2016  . Left upper extremity numbness 06/19/2018  . Major depressive disorder, recurrent episode, severe ([place]) 01/12/2014  . Malignant neoplasm of upper-inner quadrant of female breast ([place]) 01/24/2013    Left IDC, ER 3%, PR-, Her2neu-  Formatting of this note might be different from the original. Overview:   Left IDC, ER 3%, PR-, Her2neu-  Last Assessment & Plan:  Pt will continue to take her tamoxifen as previously directed. She appears to be tolerating this with only c/o moderate, occ hot flashes.  . Mild cognitive impairment 08/24/2019  . Mild neurocognitive disorder due to multiple etiologies 03/17/2020  . MRSA (methicillin resistant Staphylococcus aureus) 2009   right groin area-no issues now. 04-07-14 PCR screen negative today.  . Neuropathy   . Neuropathy of hand, left 09/21/2015  . Nodule of finger of left hand 11/14/2016  . Palpitations 07/30/2020  . Preventative health care 11/13/2016  . Right hip pain 11/14/2016  . S/P mastectomy, right 12/26/2018  . Sebaceous cyst 04/12/2013  . Shoulder joint pain 12/12/2013  . Strain of rotator cuff 02/24/2014  . Stroke ([place]) 06/2020  . Vertigo    BP (!) 89/57   Pulse 73   Temp 98.9 F (37.2 C)   Ht 5' 2"  (1.575 m)   Wt 152 lb 3.2 oz (69 kg)   LMP 01/20/2013   SpO2 95%   BMI 27.84 kg/m   Opioid Risk Score:   Fall Risk Score:  `1  Depression screen PHQ 2/9  Depression screen [facility] 2/9 03/08/2021 11/10/2020 06/30/2020 05/26/2020 05/05/2020 05/02/2019 04/30/2018  Decreased Interest 1 1 1  0 2 0 0  Down, Depressed, Hopeless 1 1 1  0 1 0 1  PHQ - 2 Score 2 2 2  0 3 0 1  Altered sleeping 0 - - - 2 - -  Tired, decreased energy 1 - - - 3 - -  Change in appetite 0 - - - 2 - -  Feeling bad or failure about yourself  0 - - - 1 - -  Trouble concentrating 0 - - - 1 - -  Moving slowly or fidgety/restless 0 - - - 1 - -  Suicidal thoughts 0 - - - 0 - -  PHQ-9 Score 3 - - - 13 - -  Some encounter  information is confidential and restricted. Go to Review Flowsheets activity to see all data.  Some recent data might be hidden   Review of Systems  Constitutional: Negative.   HENT: Negative.   Eyes: Negative.   Respiratory: Negative.   Cardiovascular: Negative.   Gastrointestinal: Negative.   Endocrine: Negative.   Genitourinary: Negative.   Musculoskeletal:       Hand and feet pain  Skin: Negative.   Allergic/Immunologic: Negative.   Neurological: Negative.   Hematological: Negative.   Psychiatric/Behavioral: Negative.   All other systems reviewed and are negative.      Objective:   Physical Exam Gen: no distress, normal appearing HEENT: oral mucosa pink and moist, NCAT Cardio: Reg rate Chest: normal effort, normal rate of breathing Abd: soft, non-distended Ext: no edema Psych: pleasant, normal affect Skin: intact Neuro: Alert and oriented x3. Able to walk in and out of the office well. Normal gait Musculoskeletal: 5/5 strength in bilateral lower extremities and and upper extremities. Sensation intact throughout.   Assessment & Plan:  Kristi [name] is a 65 year old woman who presents for f/u of bilateral foot painful chemo-therapy induced peripheral neuropathy since starting Anastrazole chemotherapy for breast  cancer in 2015. She was also admitted to CIR for left cerebellar artery stroke. She also reports loss of skin pigmentation on trunk.  1) Chemotherapy induced peripheral neuropathy: -Refilled Gabapentin 600/600/876m. -Pain contract signed previously.   -UDS obtained previously. Personally reviewed and contains expected metabolites. -PDMP reviewed. Continue Norco 11mup to 3 times daily. MME 30. Discussed Nucynta as an alternative option and provided her with a copay card for her to discuss with her pharmacy/insurance company regarding cost.  -Provided referral [facility] for [facility] for lumbar sympathetic nerve block.   -Discussed  Qutenza as an option for neuropathic pain control. Discussed that this is a capsaicin patch, stronger than capsaicin cream. Discussed that it is currently approved for diabetic peripheral neuropathy and post-herpetic neuralgia, but that it has also shown benefit in treating other forms of neuropathy. Provided patient with link to site to learn more about the patch: [email or web address] that the patch would be placed in office and benefits usually last 3 months. Discussed that unintended exposure to capsaicin can cause severe irritation of eyes, mucous membranes, respiratory tract, and skin, but that Qutenza is a local treatment and does not have the systemic side effects of other nerve medications. Discussed that there may be pain, itching, erythema, and decreased sensory function associated with the application of Qutenza. Side effects usually subside within 1 week. A cold pack of analgesic medications can help with these side effects. Blood pressure can also be increased due to pain associated with administration of the patch.  2) Hypertension/Hypotensionn: BP is much better controlled today. Advised that she log pressures at home and bring to our follow-up appointment. Continue only with half-dose of Valsartan-HCTZ daily. Normalizing BP has helped with dizziness. Explained mechanism for orthostatic hypotension. Discussed that we can go down on BP medication further but she prefers to maintain at this time.  -BP is 89/57 today. Discussed lowering medication but she prefers not to at this time since BP has been higher at home -Advised checking BP daily at home and logging results to bring into follow-up appointment with her PCP and myself. -Reviewed BP meds today. Advised that she can try skipping Avapro and check her BP more frequently that day as she may not require medication. Discussed side effects of hypotension such as dizziness and fatigue.  -Advised regarding healthy foods that can help  lower blood pressure and provided with a list:  HTN: -BP is ___today.  -Advised checking BP daily at home and logging results to bring into follow-up appointment with her PCP and myself. -Reviewed BP meds today.  -Advised regarding healthy foods that can help lower blood pressure and provided with a list: 1) citrus foods- high in vitamins and minerals 2) salmon and other fatty fish - reduces inflammation and oxylipins 3) swiss chard (leafy green)- high level of nitrates 4) pumpkin seeds- one of the best natural sources of magnesium 5) Beans and lentils- high in fiber, magnesium, and potassium 6) Berries- high in flavonoids 7) Amaranth (whole grain, can be cooked similarly to rice and oats)- high in magnesium and fiber 8) Pistachios- even more effective at reducing BP than other nuts 9) Carrots- high in phenolic compounds that relax blood vessels and reduce inflammation 10) Celery- contain phthalides that relax tissues of arterial walls 11) Tomatoes- can also improve cholesterol and reduce risk of heart disease 12) Broccoli- good source of magnesium, calcium, and potassium 13) Greek yogurt: high in potassium and calcium 14) Herbs and spices: Celery seed,  cilantro, saffron, lemongrass, black cumin, ginseng, cinnamon, cardamom, sweet basil, and ginger 15) Chia and flax seeds- also help to lower cholesterol and blood sugar 16) Beets- high levels of nitrates that relax blood vessels  17) spinach and bananas- high in potassium  -Provided lise of supplements that can help with hypertension:  1) magnesium: one high quality brand is Bioptemizers since it contains all 7 types of magnesium, otherwise over the counter magnesium gluconate 478m is a good option 2) B vitamins 3) vitamin D 4) potassium 5) CoQ10 6) L-arginine 7) Vitamin C 8) Beetroot -Educated that goal BP is 120/80. -Made goal to incorporate some of the above foods into diet.    3) General health: Educated regarding low  carb/sugar diet and discussed her current diet. Advise regarding anti-inflammatory foods. Commended 45 minute walk daily! Advised resistive exercises daily.   4) History of breast cancer: continue ketogenic diet. Continue daily turmeric. Prescribed lymphedema therapy.   5) Vitiligo: Given location where she had radiation, it may be a result of destruction of the pigment producing cells -It is not bothersome to her -Advised to mention to oncologist -Advised that there is no treatment for vitiligo.  -It has been stbale  All questions answered. RTC in 4 weeks with EZella Ballx3, then 4 months with me

## 2021-03-29 ENCOUNTER — Ambulatory Visit: Payer: Medicare Other | Admitting: Family Medicine

## 2021-04-11 ENCOUNTER — Encounter: Payer: Medicare Other | Admitting: Physical Therapy

## 2021-04-21 ENCOUNTER — Encounter: Payer: Self-pay | Admitting: Registered Nurse

## 2021-04-21 ENCOUNTER — Other Ambulatory Visit: Payer: Self-pay

## 2021-04-21 ENCOUNTER — Encounter: Payer: Medicare Other | Attending: Physical Medicine and Rehabilitation | Admitting: Registered Nurse

## 2021-04-21 VITALS — BP 100/65 | HR 66 | Temp 98.2°F | Ht 62.0 in | Wt 152.8 lb

## 2021-04-21 DIAGNOSIS — G894 Chronic pain syndrome: Secondary | ICD-10-CM | POA: Insufficient documentation

## 2021-04-21 DIAGNOSIS — G620 Drug-induced polyneuropathy: Secondary | ICD-10-CM | POA: Diagnosis not present

## 2021-04-21 DIAGNOSIS — M2550 Pain in unspecified joint: Secondary | ICD-10-CM | POA: Diagnosis present

## 2021-04-21 DIAGNOSIS — Z5181 Encounter for therapeutic drug level monitoring: Secondary | ICD-10-CM | POA: Diagnosis not present

## 2021-04-21 DIAGNOSIS — T451X5A Adverse effect of antineoplastic and immunosuppressive drugs, initial encounter: Secondary | ICD-10-CM | POA: Diagnosis present

## 2021-04-21 DIAGNOSIS — Z79891 Long term (current) use of opiate analgesic: Secondary | ICD-10-CM

## 2021-04-21 MED ORDER — HYDROCODONE-ACETAMINOPHEN 10-325 MG PO TABS: 1.0000 | ORAL_TABLET | Freq: Three times a day (TID) | ORAL | 0 refills | Status: DC | PRN

## 2021-04-21 NOTE — Progress Notes (Signed)
Subjective:    Patient ID: Kristi [name], female    DOB: 1956-04-07, 65 y.o.   MRN: [id number]  HPI: Kristi [name] is a 65 y.o. female who returns for follow up appointment for chronic pain and medication refill. She states her pain is located in her bilateral feet with tingling and generalize joint pain.  She rates her pain 5. Her current exercise regime is walking and performing stretching exercises.   Kristi [name] Morphine equivalent is 30.00  MME.   UDS ordered today.   Pain Inventory Average Pain 4 Pain Right Now 5 My pain is sharp, burning, tingling, and aching  In the last 24 hours, has pain interfered with the following? General activity 4 Relation with others 0 Enjoyment of life 5 What TIME of day is your pain at its worst? morning , daytime, and evening Sleep (in general) Good  Pain is worse with: walking, inactivity, and standing Pain improves with: medication Relief from Meds: 6  Family History  Problem Relation Age of Onset   Lung cancer Father    Hypertension Father    Thyroid cancer Father        dx in his 67s   Cancer Father        lung, thyroid, smoker   Breast cancer Paternal Aunt 69   Colon cancer Paternal Aunt        dx in her 50x   Cervical cancer Paternal Aunt        dzx in her 49s   Ovarian cancer Cousin        dx in her lage 55s   Breast cancer Cousin        maternal first cousin, once removed; dx in her late 45s   Breast cancer Cousin        maternal first cousin once removed; dx in late 63s   Hypertension Mother    Diabetes Mother    Dementia Mother    Memory loss Mother    Hypertension Brother    Seizures Brother        Alcohol induced.   Alcohol abuse Brother        drinker, smoker   COPD Brother    Cancer Paternal Uncle        oral cancer   Kidney cancer Paternal Grandmother    Arthritis Daughter        back surgery   Cancer Cousin        several paternal cousins with brain cancer, leukemia, and other cancers   Cancer Sister         stomach   Social History   Socioeconomic History   Marital status: Married    Spouse name: Kristi [name]   Number of children: 1   Years of education: 12   Highest education level: Not on file  Occupational History   Occupation: INVENTORY Hotel manager: [employer]   Occupation: disability no longer working 2015  Tobacco Use   Smoking status: Never   Smokeless tobacco: Never  Vaping Use   Vaping Use: Never used  Substance and Sexual Activity   Alcohol use: No   Drug use: No   Sexual activity: Yes    Comment: lives with husband, disability/retirement. [employer], no dietary restrictions  Other Topics Concern   Not on file  Social History Narrative   Patient is married Kristi [name]) and lives at home with her husband.   Patient has one daughter  Patient drinks very little caffeine.   Left handed   Social Determinants of Health   Financial Resource Strain: Low Risk    Difficulty of Paying Living Expenses: Not hard at all  Food Insecurity: No Food Insecurity   Worried About Charity fundraiser in the Last Year: Never true   Ran Out of Food in the Last Year: Never true  Transportation Needs: No Transportation Needs   Lack of Transportation (Medical): No   Lack of Transportation (Non-Medical): No  Physical Activity: Not on file  Stress: Not on file  Social Connections: Not on file   Past Surgical History:  Procedure Laterality Date   ANAL SPHINCTEROTOMY  04/2011   APPENDECTOMY  1980   AXILLARY LYMPH NODE DISSECTION Left 02/04/2013   Procedure: LEFT AXILLARY LYMPH NODE DISSECTION;  Surgeon: Stark Klein, MD;  Location: [facility];  Service: General;  Laterality: Left;  End: 5974   BREAST LUMPECTOMY WITH NEEDLE LOCALIZATION Left 02/04/2013   Procedure: LEFT BREAST LUMPECTOMY WITH NEEDLE LOCALIZATION;  Surgeon: Stark Klein, MD;  Location: [facility];  Service: General;  Laterality: Left;   BREAST SURGERY     Lumpectomy in april 2014   [place]  04/2011    ligation   MASTECTOMY Left 02/15/2017   PORT-A-CATH REMOVAL N/A 04/16/2014   Procedure: REMOVAL PORT-A-CATH;  Surgeon: Stark Klein, MD;  Location: [facility];  Service: General;  Laterality: N/A;   PORTACATH PLACEMENT Right 02/04/2013   Procedure: INSERTION PORT-A-CATH;  Surgeon: Stark Klein, MD;  Location: [facility];  Service: General;  Laterality: Right;  Start Time: 1638.   SHOULDER ARTHROSCOPY WITH ROTATOR CUFF REPAIR AND SUBACROMIAL DECOMPRESSION Left 02/24/2014   Procedure: SHOULDER ARTHROSCOPY WITH ROTATOR CUFF REPAIR AND SUBACROMIAL DECOMPRESSION;  Surgeon: Meredith Pel, MD;  Location: [facility];  Service: Orthopedics;  Laterality: Left;  LEFT SHOULDER DIAGNOSTIC OPERATIVE ARTHROSCOPY, SUBACROMIAL DECOMPRESSION, ROTATOR CUFF TEAR REPAIR   SIMPLE MASTECTOMY WITH AXILLARY SENTINEL NODE BIOPSY Left 02/15/2017   Procedure: LEFT MASTECTOMY;  Surgeon: Stark Klein, MD;  Location: [facility];  Service: General;  Laterality: Left;   TOTAL MASTECTOMY Right 12/26/2018   Procedure: RIGHT BREAST PROPHYLATIC MASTECTOMY;  Surgeon: Stark Klein, MD;  Location: [facility];  Service: General;  Laterality: Right;   Past Surgical History:  Procedure Laterality Date   ANAL SPHINCTEROTOMY  04/2011   APPENDECTOMY  1980   AXILLARY LYMPH NODE DISSECTION Left 02/04/2013   Procedure: LEFT AXILLARY LYMPH NODE DISSECTION;  Surgeon: Stark Klein, MD;  Location: [facility];  Service: General;  Laterality: Left;  End: 4536   BREAST LUMPECTOMY WITH NEEDLE LOCALIZATION Left 02/04/2013   Procedure: LEFT BREAST LUMPECTOMY WITH NEEDLE LOCALIZATION;  Surgeon: Stark Klein, MD;  Location: [facility];  Service: General;  Laterality: Left;   BREAST SURGERY     Lumpectomy in april 2014   [place]  04/2011   ligation   MASTECTOMY Left 02/15/2017   PORT-A-CATH REMOVAL N/A 04/16/2014   Procedure: REMOVAL PORT-A-CATH;  Surgeon: Stark Klein, MD;  [facility];  Service: General;  Laterality: N/A;   PORTACATH PLACEMENT Right 02/04/2013    Procedure: INSERTION PORT-A-CATH;  Surgeon: Stark Klein, MD;  Location: [facility];  Service: General;  Laterality: Right;  Start Time: 4680.   SHOULDER ARTHROSCOPY WITH ROTATOR CUFF REPAIR AND SUBACROMIAL DECOMPRESSION Left 02/24/2014   Procedure: SHOULDER ARTHROSCOPY WITH ROTATOR CUFF REPAIR AND SUBACROMIAL DECOMPRESSION;  Surgeon: Meredith Pel, MD;  Location: [facility];  Service: Orthopedics;  Laterality: Left;  LEFT SHOULDER DIAGNOSTIC  OPERATIVE ARTHROSCOPY, SUBACROMIAL DECOMPRESSION, ROTATOR CUFF TEAR REPAIR   SIMPLE MASTECTOMY WITH AXILLARY SENTINEL NODE BIOPSY Left 02/15/2017   Procedure: LEFT MASTECTOMY;  Surgeon: Stark Klein, MD;  Location: [facility];  Service: General;  Laterality: Left;   TOTAL MASTECTOMY Right 12/26/2018   Procedure: RIGHT BREAST PROPHYLATIC MASTECTOMY;  Surgeon: Stark Klein, MD;  Location: [facility];  Service: General;  Laterality: Right;   Past Medical History:  Diagnosis Date   Acute blood loss anemia    Anal fissure 05/03/2011   Anemia    Iron deficinecy anemia   Anemia 05/14/2017   Anorectal pain 04/12/2013   Anxiety    Anxiety and depression 05/15/2014   Anxiety state 05/15/2014   Arthritis    Breast cancer ([place])    left ,last radiation 2'15, last chemo 8'14   Cervical cancer screening 06/18/2018   Menarche at 11 Regular and moderate flow  history of abnormal pap in past, 1 mild abnormality years ago that resolved spontaneously on recheck G1P1, s/p 1 svd history of abnormal MGM, b/o breast cancer 2014 No concerns today  gyn surgeries. Lumpectomy  LMP around early 2014   Constipation 11/13/2016   Contracture of axilla 05/20/2013   Debility 01/18/2020   Depression    Flushing 02/20/2014   Genetic testing 02/20/2019   Negative genetic testing on the Comprehensive Cancer Panel.  The Comprehensive Common Cancer Panel offered by [facility] includes sequencing and/or deletion duplication testing of the following 46 genes: APC, ATM, AXIN2, BAP1, BARD1, BMPR1A, BRCA1, BRCA2, BRIP1, CDH1,  CDK4, CDKN2A, CHEK2, EPCAM, FANCC, FH, FLCN, HOXB13, MET, MITF,  MLH1, MSH2, MSH6, MUTYH, NBN, NF1, NTHL1,  PALB2, PMS2, POLD1, POLE, P   H/O: CVA (cerebrovascular accident) 06/19/2020   Hereditary and idiopathic peripheral neuropathy 11/11/2013   Herpes zoster 05/15/2014   History of chicken pox    History of left breast cancer 02/15/2017   History of radiation therapy 09/09/13-10/28/13   45 gray to left breast, lumpectomy cavity boosted to 63 gray   HTN (hypertension) 11/13/2016   Hyperglycemia 01/09/2016   Hyperlipidemia 05/14/2017   Hypertension    Internal hemorrhoids with complication 1/61/0960   Knee pain, bilateral 11/14/2016   Left upper extremity numbness 06/19/2018   Major depressive disorder, recurrent episode, severe ([place]) 01/12/2014   Malignant neoplasm of upper-inner quadrant of female breast ([place]) 01/24/2013    Left IDC, ER 3%, PR-, Her2neu-  Formatting of this note might be different from the original. Overview:   Left IDC, ER 3%, PR-, Her2neu-  Last Assessment & Plan:  Pt will continue to take her tamoxifen as previously directed. She appears to be tolerating this with only c/o moderate, occ hot flashes.   Mild cognitive impairment 08/24/2019   Mild neurocognitive disorder due to multiple etiologies 03/17/2020   MRSA (methicillin resistant Staphylococcus aureus) 2009   right groin area-no issues now. 04-07-14 PCR screen negative today.   Neuropathy    Neuropathy of hand, left 09/21/2015   Nodule of finger of left hand 11/14/2016   Palpitations 07/30/2020   Preventative health care 11/13/2016   Right hip pain 11/14/2016   S/P mastectomy, right 12/26/2018   Sebaceous cyst 04/12/2013   Shoulder joint pain 12/12/2013   Strain of rotator cuff 02/24/2014   Stroke ([place]) 06/2020   Vertigo    BP 100/65 (BP Location: Right Arm, Patient Position: Sitting, Cuff Size: Normal)   Pulse 66   Temp 98.2 F (36.8 C) (Oral)   Ht 5' 2"  (1.575 m)   Wt  152 lb 12.8 oz (69.3 kg)   LMP 01/20/2013   SpO2 97%   BMI  27.95 kg/m   Opioid Risk Score:   Fall Risk Score:  `1  Depression screen PHQ 2/9  Depression screen [facility] 2/9 04/21/2021 03/08/2021 11/10/2020 06/30/2020 05/26/2020 05/05/2020 05/02/2019  Decreased Interest 0 1 1 1  0 2 0  Down, Depressed, Hopeless 0 1 1 1  0 1 0  PHQ - 2 Score 0 2 2 2  0 3 0  Altered sleeping - 0 - - - 2 -  Tired, decreased energy - 1 - - - 3 -  Change in appetite - 0 - - - 2 -  Feeling bad or failure about yourself  - 0 - - - 1 -  Trouble concentrating - 0 - - - 1 -  Moving slowly or fidgety/restless - 0 - - - 1 -  Suicidal thoughts - 0 - - - 0 -  PHQ-9 Score - 3 - - - 13 -  Some encounter information is confidential and restricted. Go to Review Flowsheets activity to see all data.  Some recent data might be hidden     Review of Systems  Musculoskeletal:        Left hand pain and both fee  Neurological:        Tingling  All other systems reviewed and are negative.     Objective:   Physical Exam Vitals and nursing note reviewed.  Constitutional:      Appearance: Normal appearance.  Cardiovascular:     Rate and Rhythm: Normal rate and regular rhythm.     Pulses: Normal pulses.     Heart sounds: Normal heart sounds.  Pulmonary:     Effort: Pulmonary effort is normal.     Breath sounds: Normal breath sounds.  Musculoskeletal:     Cervical back: Normal range of motion and neck supple.     Comments: Normal Muscle Bulk and Muscle Testing Reveals:  Upper Extremities: Full ROM and Muscle Strength  5/5 Lower Extremities Full ROM and Muscle Strength 5/5 Arises from Table with ease Narrow Based Gait     Skin:    General: Skin is warm and dry.  Neurological:     Mental Status: She is alert and oriented to person, place, and time.  Psychiatric:        Mood and Affect: Mood normal.        Behavior: Behavior normal.         Assessment & Plan:  1. Chemotherapy Induced Peripheral Neuropathy: Continue current medication regimen with Gabapentin. Continue to Monitor.   04/21/21 2. Chronic Pain Syndrome: Refilled Hydrocodone 10/325 mg one tablet three times a day as needed. #90. 04/21/2021 We will continue the opioid monitoring program, this consists of regular clinic visits, examinations, urine drug screen, pill counts as well as use of [place] Controlled Substance Reporting system. A 12 month History has been reviewed on the [facility] on 04/21/2021   F/U in 1 month

## 2021-04-22 ENCOUNTER — Encounter: Payer: Medicare Other | Admitting: Physical Medicine and Rehabilitation

## 2021-04-22 ENCOUNTER — Other Ambulatory Visit: Payer: Self-pay | Admitting: Physical Medicine and Rehabilitation

## 2021-04-27 ENCOUNTER — Other Ambulatory Visit: Payer: Self-pay

## 2021-04-27 ENCOUNTER — Encounter: Payer: Self-pay | Admitting: Cardiology

## 2021-04-27 ENCOUNTER — Ambulatory Visit: Payer: Medicare Other | Admitting: Cardiology

## 2021-04-27 VITALS — BP 89/49 | Ht 62.0 in | Wt 152.1 lb

## 2021-04-27 DIAGNOSIS — I10 Essential (primary) hypertension: Secondary | ICD-10-CM

## 2021-04-27 DIAGNOSIS — R002 Palpitations: Secondary | ICD-10-CM

## 2021-04-27 DIAGNOSIS — K6289 Other specified diseases of anus and rectum: Secondary | ICD-10-CM

## 2021-04-27 DIAGNOSIS — Z860100 Personal history of colon polyps, unspecified: Secondary | ICD-10-CM | POA: Insufficient documentation

## 2021-04-27 DIAGNOSIS — E782 Mixed hyperlipidemia: Secondary | ICD-10-CM | POA: Diagnosis not present

## 2021-04-27 HISTORY — DX: Personal history of colonic polyps: Z86.010

## 2021-04-27 HISTORY — DX: Other specified diseases of anus and rectum: K62.89

## 2021-04-27 HISTORY — DX: Personal history of colon polyps, unspecified: Z86.0100

## 2021-04-27 NOTE — Progress Notes (Signed)
Cardiology Office Note:    Date:  04/27/2021   ID:  Kristi [name], DOB 10-10-1956, MRN [id number]  PCP:  Mosie Lukes, MD  Cardiologist:  Jenean Lindau, MD   Referring MD: Mosie Lukes, MD    ASSESSMENT:    1. Essential (primary) hypertension   2. Mixed hyperlipidemia   3. Palpitations    PLAN:    In order of problems listed above:  Primary prevention stressed with the patient.  Importance of compliance with diet medication stressed and she vocalized understanding. Essential hypertension: Blood pressure stable and diet was emphasized.  Her blood pressure stable at home.  Here is borderline.  She is asymptomatic.  I told her to keep a log of blood pressures for a week and mail them out to [place].  I told her to keep her self well-hydrated especially when she is out in the heat. Mixed dyslipidemia: Diet was emphasized.  Lipids are fine and I reviewed the numbers with her. Palpitations: She tells me that have now resolved and she is happy about it. She was advised to walk at least a day 5 days a week and she promises to do so. Patient will be seen in follow-up appointment in 6 months or earlier if the patient has any concerns    Medication Adjustments/Labs and Tests Ordered: Current medicines are reviewed at length with the patient today.  Concerns regarding medicines are outlined above.  No orders of the defined types were placed in this encounter.  No orders of the defined types were placed in this encounter.    No chief complaint on file.    History of Present Illness:    Kristi [name] is a 65 y.o. female.  Patient has past medical history of essential hypertension and dyslipidemia.  She denies any problems at this time and takes care of activities of daily living.  No chest pain orthopnea or PND.  At the time of my evaluation, the patient is alert awake oriented and in no distress.  Past Medical History:  Diagnosis Date   Abnormal gait 12/17/2019   Acute blood  loss anemia    Anal fissure 05/03/2011   Anemia    Iron deficinecy anemia   Anemia 05/14/2017   Anxiety and depression 05/15/2014   Arthritis    Body mass index (BMI) 30.0-30.9, adult 12/17/2019   Breast cancer ([place])    left ,last radiation 2'15, last chemo 8'14   Cervical cancer screening 06/18/2018   Menarche at 11 Regular and moderate flow  history of abnormal pap in past, 1 mild abnormality years ago that resolved spontaneously on recheck G1P1, s/p 1 svd history of abnormal MGM, b/o breast cancer 2014 No concerns today  gyn surgeries. Lumpectomy  LMP around early 2014   Cervical stenosis of spine 12/17/2019   Constipation 11/13/2016   Contracture of axilla 05/20/2013   Debility 01/18/2020   Essential (primary) hypertension 12/17/2019   Flushing 02/20/2014   Gait instability 12/17/2019   Genetic testing 02/20/2019   Negative genetic testing on the Comprehensive Cancer Panel.  The Comprehensive Common Cancer Panel offered by [facility] includes sequencing and/or deletion duplication testing of the following 46 genes: APC, ATM, AXIN2, BAP1, BARD1, BMPR1A, BRCA1, BRCA2, BRIP1, CDH1, CDK4, CDKN2A, CHEK2, EPCAM, FANCC, FH, FLCN, HOXB13, MET, MITF,  MLH1, MSH2, MSH6, MUTYH, NBN, NF1, NTHL1,  PALB2, PMS2, POLD1, POLE, P   H/O: CVA (cerebrovascular accident) 06/19/2020   Hereditary and idiopathic peripheral neuropathy 11/11/2013   Herpes zoster  05/15/2014   History of chicken pox    History of left breast cancer 02/15/2017   History of radiation therapy 09/09/13-10/28/13   45 gray to left breast, lumpectomy cavity boosted to 63 gray   HTN (hypertension) 11/13/2016   Hyperglycemia 01/09/2016   Hyperlipidemia 05/14/2017   Hypertension    Internal hemorrhoids with complication 02/63/7858   Knee pain, bilateral 11/14/2016   Left upper extremity numbness 06/19/2018   Major depressive disorder, recurrent episode, severe ([place]) 01/12/2014   Malignant neoplasm of upper-inner quadrant of female breast  ([place]) 01/24/2013    Left IDC, ER 3%, PR-, Her2neu-  Formatting of this note might be different from the original. Overview:   Left IDC, ER 3%, PR-, Her2neu-  Last Assessment & Plan:  Pt will continue to take her tamoxifen as previously directed. She appears to be tolerating this with only c/o moderate, occ hot flashes.   Mild neurocognitive disorder due to multiple etiologies 03/17/2020   MRSA (methicillin resistant Staphylococcus aureus) 2009   right groin area-no issues now. 04-07-14 PCR screen negative today.   Neuropathy    Neuropathy of hand, left 09/21/2015   Nodule of finger of left hand 11/14/2016   Palpitations 07/30/2020   Preventative health care 11/13/2016   Right hip pain 11/14/2016   S/P mastectomy, right 12/26/2018   Sebaceous cyst 04/12/2013   Shoulder joint pain 12/12/2013   Spinal stenosis in cervical region 10/09/2019   Strain of rotator cuff 02/24/2014   Stroke ([place]) 06/2020   Vertigo     Past Surgical History:  Procedure Laterality Date   ANAL SPHINCTEROTOMY  04/2011   APPENDECTOMY  1980   AXILLARY LYMPH NODE DISSECTION Left 02/04/2013   Procedure: LEFT AXILLARY LYMPH NODE DISSECTION;  Surgeon: Stark Klein, MD;  Location: [facility];  Service: General;  Laterality: Left;  End: 8502   BREAST LUMPECTOMY WITH NEEDLE LOCALIZATION Left 02/04/2013   Procedure: LEFT BREAST LUMPECTOMY WITH NEEDLE LOCALIZATION;  Surgeon: Stark Klein, MD;  Location: [facility];  Service: General;  Laterality: Left;   BREAST SURGERY     Lumpectomy in april 2014   [place]  04/2011   ligation   MASTECTOMY Left 02/15/2017   PORT-A-CATH REMOVAL N/A 04/16/2014   Procedure: REMOVAL PORT-A-CATH;  Surgeon: Stark Klein, MD;  [facility];  Service: General;  Laterality: N/A;   PORTACATH PLACEMENT Right 02/04/2013   Procedure: INSERTION PORT-A-CATH;  Surgeon: Stark Klein, MD;  Location: [facility];  Service: General;  Laterality: Right;  Start Time: 7741.   SHOULDER ARTHROSCOPY WITH ROTATOR CUFF REPAIR  AND SUBACROMIAL DECOMPRESSION Left 02/24/2014   Procedure: SHOULDER ARTHROSCOPY WITH ROTATOR CUFF REPAIR AND SUBACROMIAL DECOMPRESSION;  Surgeon: Meredith Pel, MD;  Location: [place];  Service: Orthopedics;  Laterality: Left;  LEFT SHOULDER DIAGNOSTIC OPERATIVE ARTHROSCOPY, SUBACROMIAL DECOMPRESSION, ROTATOR CUFF TEAR REPAIR   SIMPLE MASTECTOMY WITH AXILLARY SENTINEL NODE BIOPSY Left 02/15/2017   Procedure: LEFT MASTECTOMY;  Surgeon: Stark Klein, MD;  Location: [place];  Service: General;  Laterality: Left;   TOTAL MASTECTOMY Right 12/26/2018   Procedure: RIGHT BREAST PROPHYLATIC MASTECTOMY;  Surgeon: Stark Klein, MD;  Location: [place];  Service: General;  Laterality: Right;    Current Medications: Current Meds  Medication Sig   HYDROcodone-acetaminophen (NORCO) 10-325 MG tablet Take 1 tablet by mouth 3 (three) times daily as needed for pain.   pyridOXINE (B-6) 50 MG tablet Take 50 mg by mouth daily.     Allergies:   Patient has no known allergies.  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 [employer], no dietary restrictions  Other Topics Concern   Not on file  Social History Narrative   Patient is married [name]) and lives at home with her husband.   Patient has one daughter   Patient drinks very little caffeine.   Left handed   Social Determinants of Health   Financial Resource Strain: Low Risk    Difficulty of Paying Living Expenses: Not hard at  all  Food Insecurity: No Food Insecurity   Worried About Charity fundraiser in the Last Year: Never true   Ran Out of Food in the Last Year: Never true  Transportation Needs: No Transportation Needs   Lack of Transportation (Medical): No   Lack of Transportation (Non-Medical): No  Physical Activity: Not on file  Stress: Not on file  Social Connections: Not on file     Family History: The patient's family history includes Alcohol abuse in her brother; Arthritis in her daughter; Breast cancer in her cousin and cousin; Breast cancer (age of onset: 53) in her paternal aunt; COPD in her brother; Cancer in her cousin, father, paternal uncle, and sister; Cervical cancer in her paternal aunt; Colon cancer in her paternal aunt; Dementia in her mother; Diabetes in her mother; Hypertension in her brother, father, and mother; Kidney cancer in her paternal grandmother; Lung cancer in her father; Memory loss in her mother; Ovarian cancer in her cousin; Seizures in her brother; Thyroid cancer in her father.  ROS:   Please see the history of present illness.    All other systems reviewed and are negative.  EKGs/Labs/Other Studies Reviewed:    The following studies were reviewed today: I discussed my findings with the patient at extensive length.   Recent Labs: 03/08/2021: ALT 18; BUN 14; Creatinine, Ser 1.07; Hemoglobin 11.2; Platelets 171.0; Potassium 4.4; Sodium 141; TSH 1.89  Recent Lipid Panel    Component Value Date/Time   CHOL 115 03/08/2021 1422   TRIG 62.0 03/08/2021 1422   HDL 51.40 03/08/2021 1422   CHOLHDL 2 03/08/2021 1422   VLDL 12.4 03/08/2021 1422   LDLCALC 51 03/08/2021 1422    Physical Exam:    VS:  BP (!) 89/49   Ht 5' 2" (1.575 m)   Wt 152 lb 1.9 oz (69 kg)   LMP 01/20/2013   SpO2 96%   BMI 27.82 kg/m     Wt Readings from Last 3 Encounters:  04/27/21 152 lb 1.9 oz (69 kg)  04/21/21 152 lb 12.8 oz (69.3 kg)  03/23/21 152 lb 3.2 oz (69 kg)     GEN: Patient is in  no acute distress HEENT: Normal NECK: No JVD; No carotid bruits LYMPHATICS: No lymphadenopathy CARDIAC: Hear sounds regular, 2/6 systolic murmur at the apex. RESPIRATORY:  Clear to auscultation without rales, wheezing or rhonchi  ABDOMEN: Soft, non-tender, non-distended MUSCULOSKELETAL:  No edema; No deformity  SKIN: Warm and dry NEUROLOGIC:  Alert and oriented x 3 PSYCHIATRIC:  Normal affect   Signed, Jenean Lindau, MD  04/27/2021 2:38 PM    [place]

## 2021-04-27 NOTE — Patient Instructions (Signed)

## 2021-04-28 LAB — TOXASSURE SELECT,+ANTIDEPR,UR

## 2021-05-02 ENCOUNTER — Telehealth: Payer: Self-pay | Admitting: *Deleted

## 2021-05-02 NOTE — Telephone Encounter (Signed)
Urine drug screen for this encounter is consistent for prescribed medication 

## 2021-05-18 ENCOUNTER — Telehealth: Payer: Self-pay | Admitting: Physical Medicine and Rehabilitation

## 2021-05-18 ENCOUNTER — Other Ambulatory Visit: Payer: Self-pay | Admitting: Physical Medicine and Rehabilitation

## 2021-05-18 MED ORDER — HYDROCODONE-ACETAMINOPHEN 10-325 MG PO TABS: 1.0000 | ORAL_TABLET | Freq: Three times a day (TID) | ORAL | 0 refills | Status: DC | PRN

## 2021-05-18 NOTE — Telephone Encounter (Signed)
Patient had an appt with Zella Ball on 05/19/21 and she is out sick.  Patient will need a refill on her hydrocodone and she will be out of meds Friday.  She has a return appt on 06/16/21 with Zella Ball.  Can you refill her medication this month?  Please let me know.  Thank you.

## 2021-05-19 ENCOUNTER — Other Ambulatory Visit: Payer: Self-pay | Admitting: Hematology and Oncology

## 2021-05-19 ENCOUNTER — Other Ambulatory Visit: Payer: Self-pay | Admitting: Family Medicine

## 2021-05-19 ENCOUNTER — Encounter: Payer: Medicare Other | Admitting: Registered Nurse

## 2021-05-19 DIAGNOSIS — C50212 Malignant neoplasm of upper-inner quadrant of left female breast: Secondary | ICD-10-CM

## 2021-05-19 NOTE — Progress Notes (Signed)
Patient Care Team: Mosie Lukes, MD as PCP - General (Family Medicine) Nicholas Lose, MD as Consulting Physician (Hematology and Oncology) Lin Landsman, MD as Consulting Physician (Family Medicine) Inocencio Homes, [employer] as Consulting Physician (Podiatry) Gery Pray, MD as Consulting Physician (Radiation Oncology) Dan Humphreys as Consulting Physician (Nurse Practitioner)  DIAGNOSIS:    ICD-10-CM   1. Malignant neoplasm of upper-inner quadrant of left breast in female, estrogen receptor positive ([place])  C50.212    Z17.0       SUMMARY OF ONCOLOGIC HISTORY: Oncology History  Malignant neoplasm of upper-inner quadrant of female breast ([place])  01/24/2013 Initial Diagnosis   Ultrasound: Left breast no position 1 x 0.7 cm, 12:00 position 1.6 x 0.9 cm in addition 2.1 cm axillary lymph node: Biopsies of breast and axilla were positive for IDC high-grade;  ER weakly positive, PR negative, HER-2 negative    02/04/2013 Surgery   Left breast lumpectomy: 1.6 cm tumor 1/19 axillary lymph nodes positive, ER 6%, PR 0%, HER-2 negative, Ki-67 90%    03/05/2013 - 08/21/2013 Chemotherapy   Adjuvant chemotherapy with Adriamycin and Cytoxan followed by Taxol and [place] x9 followed by [place] x2    03/06/2013 Genetic Testing   Negative genetic testing on the Comprehensive Cancer Panel.  The Comprehensive Common Cancer Panel offered by [facility] includes sequencing and/or deletion duplication testing of the following 46 genes: APC, ATM, AXIN2, BAP1, BARD1, BMPR1A, BRCA1, BRCA2, BRIP1, CDH1, CDK4, CDKN2A, CHEK2, EPCAM, FANCC, FH, FLCN, HOXB13, MET, MITF,  MLH1, MSH2, MSH6, MUTYH, NBN, NF1, NTHL1,  PALB2, PMS2, POLD1, POLE, POT1, PTEN, RAD51C, RAD51D, RECQL, SCG5/GREM1, SDHB, SDHC, SDHD, SMAD4, STK11, TP53, TSC1, TSC2, and VHL.  There had been a RAD51C c.919G>A VUS identified that was reclassified to likely benign on February 20, 2019.  The original report date was Mar 06, 2013.    09/09/2013 - 10/28/2013  Radiation Therapy   Adjuvant radiation therapy    11/13/2013 -  Anti-estrogen oral therapy   Tamoxifen 20 mg daily switched to anastrozole May 2017    02/15/2017 Surgery   Left mastectomy: No malignancy identified   12/26/2018 Surgery   Right mastectomy prophylactic: Benign     CHIEF COMPLIANT: Follow-up of left breast cancer on anastrozole  INTERVAL HISTORY: Kristi [name] is a 65 y.o. with above-mentioned history of left breast cancer treated with lumpectomy, adjuvant chemotherapy, radiation, and is currently on antiestrogen therapy with anastrozole. She presents to the clinic today for annual follow-up.  She had a stroke in August 2021 and she is still undergoing physical therapy but has recovered significantly.  She had dizziness and difficulty with walking at the time of the stroke.  She denies any major adverse effects of anastrozole therapy.  Denies any hot flashes or arthralgias myalgias.  Denies any lumps or nodules in the breast.  ALLERGIES:  has No Known Allergies.  MEDICATIONS:  Current Outpatient Medications  Medication Sig Dispense Refill   aspirin EC 325 MG tablet Take 1 tablet (325 mg total) by mouth daily. 30 tablet 0   Apoaequorin (PREVAGEN) 10 MG CAPS Take 1 capsule by mouth daily.     atorvastatin (LIPITOR) 40 MG tablet TAKE 1 TABLET BY MOUTH EVERY DAY 90 tablet 1   gabapentin (NEURONTIN) 300 MG capsule Take 3 capsules (900 mg total) by mouth at bedtime. 90 capsule 3   gabapentin (NEURONTIN) 600 MG tablet Take 1 tablet (600 mg total) by mouth 2 (two) times daily. 60 tablet 3   HYDROcodone-acetaminophen (NORCO)  10-325 MG tablet Take 1 tablet by mouth 3 (three) times daily as needed. 90 tablet 0   hydrOXYzine (ATARAX/VISTARIL) 50 MG tablet Take 1 tablet (50 mg total) by mouth 2 (two) times daily. 30 tablet 0   lurasidone (LATUDA) 40 MG TABS tablet Take 40 mg by mouth daily.     Multiple Vitamin (MULTIVITAMIN WITH MINERALS) TABS tablet Take 1 tablet by mouth daily.      polyethylene glycol (MIRALAX / GLYCOLAX) 17 g packet Take 17 g by mouth daily as needed for mild constipation. 14 each 0   pyridOXINE (B-6) 50 MG tablet Take 50 mg by mouth daily.     traZODone (DESYREL) 50 MG tablet Take 1 tablet (50 mg total) by mouth at bedtime. 30 tablet 0   Turmeric (QC TUMERIC COMPLEX PO) Take 1 tablet by mouth daily.     valsartan-hydrochlorothiazide (DIOVAN-HCT) 160-12.5 MG tablet Take 1 tablet by mouth daily. 90 tablet 1   No current facility-administered medications for this visit.    PHYSICAL EXAMINATION: ECOG PERFORMANCE STATUS: 1 - Symptomatic but completely ambulatory  Vitals:   05/20/21 0803  BP: 124/64  Pulse: (!) 58  Resp: 16  Temp: (!) 97.3 F (36.3 C)  SpO2: 99%   Filed Weights   05/20/21 0803  Weight: 155 lb 12.8 oz (70.7 kg)    BREAST: No palpable masses or nodules in either right or left breasts. No palpable axillary supraclavicular or infraclavicular adenopathy no breast tenderness or nipple discharge. (exam performed in the presence of a chaperone)  LABORATORY DATA:  I have reviewed the data as listed CMP Latest Ref Rng & Units 03/08/2021 06/28/2020 06/21/2020  Glucose 70 - 99 mg/dL 83 95 97  BUN 6 - 23 mg/dL _0 Creatinine 0.40 - 1.20 mg/dL 1.07 1.27(H) 0.93  Sodium 135 - 145 mEq/L 141 140 141  Potassium 3.5 - 5.1 mEq/L 4.4 4.7 4.2  Chloride 96 - 112 mEq/L 102 101 104  CO2 19 - 32 mEq/L 34(H) 31 26  Calcium 8.4 - 10.5 mg/dL 10.5 10.4 9.9  Total Protein 6.0 - 8.3 g/dL 6.9 7.0 6.1(L)  Total Bilirubin 0.2 - 1.2 mg/dL 0.4 0.3 0.8  Alkaline Phos 39 - 117 U/L 98 - 66  AST 0 - 37 U/L _1 ALT 0 - 35 U/L _2 Lab Results  Component Value Date   WBC 5.8 03/08/2021   HGB 11.2 (L) 03/08/2021   HCT 34.7 (L) 03/08/2021   MCV 86.0 03/08/2021   PLT 171.0 03/08/2021   NEUTROABS 2.4 03/08/2021    ASSESSMENT & PLAN:  Malignant neoplasm of upper-inner quadrant of female breast ([place]) Multifocal invasive ductal carcinoma  grade 3 ER weakly positive at 3% PR negative HER-2/neu negative with Ki-67 of 90% status post lumpectomy 02/04/2013 1.6 and the tumor one out of 19 lymph nodes positive status post adjuvant a.c. followed by Taxol carboplatin later switched to [place] completed 08/21/2013, status post adjuvant radiation, currently on tamoxifen started 11/13/2013 Switched to anastrozole 03/10/2016  Anastrozole toxicities: Tolerating it extremely well. Having completed 7 years of therapy, I recommended that she discontinue anastrozole therapy.   Left mastectomy: 02/15/2017: Benign Right mastectomy: 12/26/2018: Benign Bone density May 2022: T score -0.4  Stroke 2021 August 2: Doing much better Left arm lymphedema: Getting physical therapy  Breast cancer surveillance: No role of mammogram since she had bilateral mastectomies. Chest examination no palpable lumps or nodules.   Return to clinic  in 1 year for follow-up    No orders of the defined types were placed in this encounter.  The patient has a good understanding of the overall plan. she agrees with it. she will call with any problems that may develop before the next visit here.  Total time spent: 20 mins including face to face time and time spent for planning, charting and coordination of care  Rulon Eisenmenger, MD, MPH 05/20/2021  I, Thana Ates, am acting as scribe for Dr. Nicholas Lose.  I have reviewed the above documentation for accuracy and completeness, and I agree with the above.

## 2021-05-20 ENCOUNTER — Inpatient Hospital Stay: Payer: Medicare Other | Attending: Hematology and Oncology | Admitting: Hematology and Oncology

## 2021-05-20 ENCOUNTER — Other Ambulatory Visit: Payer: Self-pay

## 2021-05-20 DIAGNOSIS — Z170 Estrogen receptor positive status [ER+]: Secondary | ICD-10-CM | POA: Diagnosis not present

## 2021-05-20 DIAGNOSIS — C50212 Malignant neoplasm of upper-inner quadrant of left female breast: Secondary | ICD-10-CM | POA: Diagnosis not present

## 2021-05-20 DIAGNOSIS — Z9013 Acquired absence of bilateral breasts and nipples: Secondary | ICD-10-CM | POA: Insufficient documentation

## 2021-05-20 DIAGNOSIS — Z79899 Other long term (current) drug therapy: Secondary | ICD-10-CM | POA: Insufficient documentation

## 2021-05-20 DIAGNOSIS — Z9221 Personal history of antineoplastic chemotherapy: Secondary | ICD-10-CM | POA: Insufficient documentation

## 2021-05-20 DIAGNOSIS — Z923 Personal history of irradiation: Secondary | ICD-10-CM | POA: Insufficient documentation

## 2021-05-20 DIAGNOSIS — Z853 Personal history of malignant neoplasm of breast: Secondary | ICD-10-CM | POA: Insufficient documentation

## 2021-05-20 MED ORDER — ASPIRIN EC 325 MG PO TBEC: 325.0000 mg | DELAYED_RELEASE_TABLET | Freq: Every day | ORAL | 0 refills | Status: AC

## 2021-05-20 NOTE — Assessment & Plan Note (Signed)
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 followed by Taxol carboplatin later switched to carbo Gemzar completed 08/21/2013, status post adjuvant radiation, currently on tamoxifen started 11/13/2013 Switched to anastrozole 03/10/2016 Anastrozole toxicities: Tolerating it extremely well. Plan is to treat her for 7 years. I renewed her prescription today.  Left mastectomy: 02/15/2017: Benign Right mastectomy: 12/26/2018: Benign  Breast cancer surveillance: No role of mammogram since she had bilateral mastectomies. Chest examination no palpable lumps or nodules.  Return to clinic in 1 year for follow-up 

## 2021-05-24 ENCOUNTER — Encounter: Payer: Self-pay | Admitting: Physical Therapy

## 2021-05-24 ENCOUNTER — Ambulatory Visit: Payer: Medicare Other | Attending: Physical Medicine and Rehabilitation | Admitting: Physical Therapy

## 2021-05-24 ENCOUNTER — Other Ambulatory Visit: Payer: Self-pay

## 2021-05-24 ENCOUNTER — Telehealth: Payer: Self-pay | Admitting: Hematology and Oncology

## 2021-05-24 DIAGNOSIS — I890 Lymphedema, not elsewhere classified: Secondary | ICD-10-CM | POA: Insufficient documentation

## 2021-05-24 DIAGNOSIS — L599 Disorder of the skin and subcutaneous tissue related to radiation, unspecified: Secondary | ICD-10-CM | POA: Insufficient documentation

## 2021-05-24 NOTE — Therapy (Signed)
[place], [name], [address] Phone: [phone]   Fax:  [phone]  Physical Therapy Evaluation  Patient Details  Name: Kristi [name] MRN: [id number] Date of Birth: 05-May-1956 Referring Provider (PT): Leeroy Cha   Encounter Date: 05/24/2021   PT End of Session - 05/24/21 1149     Visit Number 1    Number of Visits 13    Date for PT Re-Evaluation 07/12/21   pt wont begin therapy for 3 weeks due to vacation   PT Start Time 1106    PT Stop Time 1145    PT Time Calculation (min) 39 min    Activity Tolerance Patient tolerated treatment well    Behavior During Therapy [facility] for tasks assessed/performed             Past Medical History:  Diagnosis Date   Abnormal gait 12/17/2019   Acute blood loss anemia    Anal fissure 05/03/2011   Anemia    Iron deficinecy anemia   Anemia 05/14/2017   Anxiety and depression 05/15/2014   Arthritis    Body mass index (BMI) 30.0-30.9, adult 12/17/2019   Breast cancer ([place])    left ,last radiation 2'15, last chemo 8'14   Cervical cancer screening 06/18/2018   Menarche at 11 Regular and moderate flow  history of abnormal pap in past, 1 mild abnormality years ago that resolved spontaneously on recheck G1P1, s/p 1 svd history of abnormal MGM, b/o breast cancer 2014 No concerns today  gyn surgeries. Lumpectomy  LMP around early 2014   Cervical stenosis of spine 12/17/2019   Constipation 11/13/2016   Contracture of axilla 05/20/2013   Debility 01/18/2020   Essential (primary) hypertension 12/17/2019   Flushing 02/20/2014   Gait instability 12/17/2019   Genetic testing 02/20/2019   Negative genetic testing on the Comprehensive Cancer Panel.  The Comprehensive Common Cancer Panel offered by [facility] includes sequencing and/or deletion duplication testing of the following 46 genes: APC, ATM, AXIN2, BAP1, BARD1, BMPR1A, BRCA1, BRCA2, BRIP1, CDH1, CDK4, CDKN2A, CHEK2,  EPCAM, FANCC, FH, FLCN, HOXB13, MET, MITF,  MLH1, MSH2, MSH6, MUTYH, NBN, NF1, NTHL1,  PALB2, PMS2, POLD1, POLE, P   H/O: CVA (cerebrovascular accident) 06/19/2020   Hereditary and idiopathic peripheral neuropathy 11/11/2013   Herpes zoster 05/15/2014   History of chicken pox    History of left breast cancer 02/15/2017   History of radiation therapy 09/09/13-10/28/13   45 gray to left breast, lumpectomy cavity boosted to 63 gray   HTN (hypertension) 11/13/2016   Hyperglycemia 01/09/2016   Hyperlipidemia 05/14/2017   Hypertension    Internal hemorrhoids with complication 53/61/4431   Knee pain, bilateral 11/14/2016   Left upper extremity numbness 06/19/2018   Major depressive disorder, recurrent episode, severe ([place]) 01/12/2014   Malignant neoplasm of upper-inner quadrant of female breast ([place]) 01/24/2013    Left IDC, ER 3%, PR-, Her2neu-  Formatting of this note might be different from the original. Overview:   Left IDC, ER 3%, PR-, Her2neu-  Last Assessment & Plan:  Pt will continue to take her tamoxifen as previously directed. She appears to be tolerating this with only c/o moderate, occ hot flashes.   Mild neurocognitive disorder due to multiple etiologies 03/17/2020   MRSA (methicillin resistant Staphylococcus aureus) 2009   right groin area-no issues now. 04-07-14 PCR screen negative today.   Neuropathy    Neuropathy of hand, left 09/21/2015   Nodule of finger of left hand 11/14/2016  Palpitations 07/30/2020   Preventative health care 11/13/2016   Right hip pain 11/14/2016   S/P mastectomy, right 12/26/2018   Sebaceous cyst 04/12/2013   Shoulder joint pain 12/12/2013   Spinal stenosis in cervical region 10/09/2019   Strain of rotator cuff 02/24/2014   Stroke ([place]) 06/2020   Vertigo     Past Surgical History:  Procedure Laterality Date   ANAL SPHINCTEROTOMY  04/2011   APPENDECTOMY  1980   AXILLARY LYMPH NODE DISSECTION Left 02/04/2013   Procedure: LEFT AXILLARY LYMPH NODE  DISSECTION;  Surgeon: Stark Klein, MD;  Location: [facility];  Service: General;  Laterality: Left;  End: 4827   BREAST LUMPECTOMY WITH NEEDLE LOCALIZATION Left 02/04/2013   Procedure: LEFT BREAST LUMPECTOMY WITH NEEDLE LOCALIZATION;  Surgeon: Stark Klein, MD;  Location: [facility];  Service: General;  Laterality: Left;   BREAST SURGERY     Lumpectomy in april 2014   [place]  04/2011   ligation   MASTECTOMY Left 02/15/2017   PORT-A-CATH REMOVAL N/A 04/16/2014   Procedure: REMOVAL PORT-A-CATH;  Surgeon: Stark Klein, MD;  Location: [facility];  Service: General;  Laterality: N/A;   PORTACATH PLACEMENT Right 02/04/2013   Procedure: INSERTION PORT-A-CATH;  Surgeon: Stark Klein, MD;  Location: [facility];  Service: General;  Laterality: Right;  Start Time: 0786.   SHOULDER ARTHROSCOPY WITH ROTATOR CUFF REPAIR AND SUBACROMIAL DECOMPRESSION Left 02/24/2014   Procedure: SHOULDER ARTHROSCOPY WITH ROTATOR CUFF REPAIR AND SUBACROMIAL DECOMPRESSION;  Surgeon: Meredith Pel, MD;  Location: [facility];  Service: Orthopedics;  Laterality: Left;  LEFT SHOULDER DIAGNOSTIC OPERATIVE ARTHROSCOPY, SUBACROMIAL DECOMPRESSION, ROTATOR CUFF TEAR REPAIR   SIMPLE MASTECTOMY WITH AXILLARY SENTINEL NODE BIOPSY Left 02/15/2017   Procedure: LEFT MASTECTOMY;  Surgeon: Stark Klein, MD;  Location: [facility];  Service: General;  Laterality: Left;   TOTAL MASTECTOMY Right 12/26/2018   Procedure: RIGHT BREAST PROPHYLATIC MASTECTOMY;  Surgeon: Stark Klein, MD;  Location: [facility];  Service: General;  Laterality: Right;    There were no vitals filed for this visit.    Subjective Assessment - 05/24/21 1106     Subjective I noticed my arm getting worse about 3 months ago. I have a sleeve but I don't know if it is the right one to have. I have been trying to wear the sleeve but it is so tight and rubs. After I was discharged last time I wore the sleeve for a while but then it fell by the wayside.    Pertinent History Left lumpectomy and axillary  lymph node dissection 02/04/13 followed by chemotherapy and radiation to left breast and axilla, bilateral feet neuropathy, anxiety and depression, Pt has left mastectomy 02/15/2017 due to pain in breast, stroke in 06/2020    Patient Stated Goals to decrease swelling    Currently in Pain? No/denies    Pain Score 0-No pain                [facility] PT Assessment - 05/24/21 0001       Assessment   Medical Diagnosis left breast cancer    Referring Provider (PT) Ranell Patrick, Martha Clan    Onset Date/Surgical Date 02/05/13   bilateral mastectomy in 2018   Hand Dominance Left    Prior Therapy prior lymphedema therapy in 2015, and 2016 (eval only), 2017 10 sessions in 2018      Precautions   Precautions Other (comment)   lymphedema     Restrictions   Weight Bearing Restrictions No      Balance  Screen   Has the patient fallen in the past 6 months No    Has the patient had a decrease in activity level because of a fear of falling?  No    Is the patient reluctant to leave their home because of a fear of falling?  No      Home Environment   Living Environment Private residence    Living Arrangements Spouse/significant other    Available Help at Discharge Family    Type of [place] Access Level entry    [place] One level      Prior Function   Level of [place] Retired    Leisure pt reports she walks 6 days a week 3 miles      Cognition   Overall Cognitive Status Within Functional Limits for tasks assessed      Observation/Other Assessments   Observations left arm appears swollen compared to right UE      Posture/Postural Control   Posture/Postural Control Postural limitations    Postural Limitations Rounded Shoulders      AROM   Right Shoulder Flexion 154 Degrees    Right Shoulder ABduction 163 Degrees    Left Shoulder Flexion 152 Degrees    Left Shoulder ABduction 145 Degrees    Left Shoulder Internal  Rotation --    Left Shoulder External Rotation --               LYMPHEDEMA/ONCOLOGY QUESTIONNAIRE - 05/24/21 0001       Type   Cancer Type Left breast      Surgeries   Mastectomy Date 02/15/17   2018 had left mastectomy and in 2020 had right mastectomy   Lumpectomy Date 02/04/13    Axillary Lymph Node Dissection Date 02/04/13    Number Lymph Nodes Removed 19      Date Lymphedema/Swelling Started   Date 05/14/14      Treatment   Active Chemotherapy Treatment No    Past Chemotherapy Treatment Yes    Date 02/23/13    Active Radiation Treatment No    Past Radiation Treatment Yes    Date 10/28/13    Body Site left chest    Current Hormone Treatment Yes    Drug Name Tamoxifen      What other symptoms do you have   Are you Having Heaviness or Tightness Yes    Are you having Pain No    Are you having pitting edema Yes    Body Site forearm    Is it Hard or Difficult finding clothes that fit Yes    Do you have infections No      Lymphedema Assessments   Lymphedema Assessments Upper extremities      Right Upper Extremity Lymphedema   15 cm Proximal to Olecranon Process 27 cm    Olecranon Process 23.5 cm    15 cm Proximal to Ulnar Styloid Process 22.5 cm    10 cm Proximal to Ulnar Styloid Process 19.7 cm    Just Proximal to Ulnar Styloid Process 15.1 cm    Across Hand at [employer] 18.1 cm    At [place] of 2nd Digit 6.4 cm      Left Upper Extremity Lymphedema   15 cm Proximal to Olecranon Process 31 cm    Olecranon Process 28.4 cm    15 cm Proximal to Ulnar Styloid Process 26.9 cm  10 cm Proximal to Ulnar Styloid Process 23.4 cm    Just Proximal to Ulnar Styloid Process 16.5 cm    Across Hand at [employer] 18 cm    At [place] of 2nd Digit 6.3 cm                     Objective measurements completed on examination: See above findings.               PT Education - 05/24/21 1205     Education provided Yes    Education Details flat  knit vs circular knit, need for daytime and night time garments, benefits of compresison bandaging for decreasing edema and sleeve to maintain UE    Person(s) Educated Patient    Methods Explanation    Comprehension Verbalized understanding                 PT Long Term Goals - 05/24/21 1202       PT LONG TERM GOAL #1   Title Pt will be independent with self MLD for long term management of lymphedema.    Time 4    Period Weeks    Status New    Target Date 07/12/21      PT LONG TERM GOAL #2   Title Pt will obtain appropriate daytime and night time garments for long term management of lymphedema.    Time 4    Period Weeks    Status New    Target Date 07/12/21      PT LONG TERM GOAL #3   Title Pt will demonstrate a 3 cm decrease in edema 15 cm proximal to ulnar process to decrease risk of infection.    Baseline 26.9 cm    Time 4    Period Weeks    Status New    Target Date 06/21/21      PT LONG TERM GOAL #4   Title Pt will be knowledgable of lymphedema risk reduction practices to decrease risk of increased swelling.    Time 4    Period Weeks    Status New    Target Date 07/12/21                    Plan - 05/24/21 1152     Clinical Impression Statement Pt reports to PT with recent exacerbation of LUE lymphedema. She reports her arm began swelling in the last three months. She has a history of left breast cancer with an ALND in 2018 and then due to breast pain she had a left mastectomy in 2018 and a right mastectomy in 2020. She has a compression sleeve but it no longer fits and she reports she did not wear it consistently. Educated pt about lymphedema and the need to wear day time and night time garment consistently for management of edema. Also educated pt about need for compression bandaging for maximal reduction of lymphedema prior to being measured for a new sleeve and glove. Pt would benefit from skilled PT to decrease LUE lymphedema and assist pt with  obtaining appropriate compression garments for long term management.    Personal Factors and Comorbidities Comorbidity 3+    Comorbidities CIPN with chronic pain, anxiety, depression    Stability/Clinical Decision Making Stable/Uncomplicated    Clinical Decision Making Low    Rehab Potential Good    Clinical Impairments Affecting Rehab Potential previous radiation,CIPN and breast lymphedema with previous treatment    PT Frequency 3x /  week    PT Duration 4 weeks   pt not starting therapy until 8/8 due to vacation plans   PT Treatment/Interventions Passive range of motion;Patient/family education;Manual techniques;Therapeutic exercise;Scar mobilization;ADLs/Self Care Home Management;Orthotic Fit/Training;Manual lymph drainage;Compression bandaging;Taping;Vasopneumatic Device    PT Next Visit Plan begin MLD and compression bandaging to LUE, eventually measure for flat knit sleeve and glove and tribute night ([place]?)    Consulted and Agree with Plan of Care Patient             Patient will benefit from skilled therapeutic intervention in order to improve the following deficits and impairments:  Increased edema, Decreased range of motion, Decreased knowledge of precautions, Postural dysfunction  Visit Diagnosis: Lymphedema, not elsewhere classified  Disorder of the skin and subcutaneous tissue related to radiation, unspecified     Problem List Patient Active Problem List   Diagnosis Date Noted   Personal history of colonic polyps 04/27/2021   Anal pain 04/27/2021   Arthritis    Breast cancer ([place])    History of radiation therapy    Hypertension    Neuropathy    Palpitations 07/30/2020   H/O: CVA (cerebrovascular accident) 06/19/2020   Vertigo    Acute blood loss anemia    Stroke ([place]) 06/2020   Mild neurocognitive disorder due to multiple etiologies 03/17/2020   Debility 01/18/2020   Abnormal gait 12/17/2019   Gait instability 12/17/2019   Body mass index (BMI) 30.0-30.9,  adult 12/17/2019   Cervical stenosis of spine 12/17/2019   Essential (primary) hypertension 12/17/2019   Spinal stenosis in cervical region 10/09/2019   Genetic testing 02/20/2019   S/P mastectomy, right 12/26/2018   Left upper extremity numbness 06/19/2018   Cervical cancer screening 06/18/2018   Anemia 05/14/2017   Hyperlipidemia 05/14/2017   History of left breast cancer 02/15/2017   Nodule of finger of left hand 11/14/2016   Right hip pain 11/14/2016   Knee pain, bilateral 11/14/2016   Constipation 11/13/2016   Preventative health care 11/13/2016   HTN (hypertension) 11/13/2016   Hyperglycemia 01/09/2016   Neuropathy of hand, left 09/21/2015   History of chicken pox    Herpes zoster 05/15/2014   Anxiety and depression 05/15/2014   Strain of rotator cuff 02/24/2014   Flushing 02/20/2014   Major depressive disorder, recurrent episode, severe ([place]) 01/12/2014   Shoulder joint pain 12/12/2013   Hereditary and idiopathic peripheral neuropathy 11/11/2013   Contracture of axilla 05/20/2013   Sebaceous cyst 04/12/2013   Malignant neoplasm of upper-inner quadrant of female breast ([place]) 01/24/2013   Anal fissure 05/03/2011   Internal hemorrhoids without complication 73/53/2992   MRSA (methicillin resistant Staphylococcus aureus) 2009    Allyson Sab[facility] 05/24/2021, 12:06 PM  [place], [name], [address] Phone: [phone]   Fax:  [phone]  Name: Kristi [name] MRN: [id number] Date of Birth: Oct 15, 1956  Manus Gunning, PT 05/24/21 12:07 PM

## 2021-05-24 NOTE — Telephone Encounter (Signed)
Scheduled per 7/15 los. Called and spoke with pt confirmed 7/17 appt

## 2021-05-26 ENCOUNTER — Telehealth: Payer: Self-pay

## 2021-05-26 DIAGNOSIS — I10 Essential (primary) hypertension: Secondary | ICD-10-CM

## 2021-05-26 MED ORDER — VALSARTAN 160 MG PO TABS: 160.0000 mg | ORAL_TABLET | Freq: Every day | ORAL | 3 refills | Status: DC

## 2021-05-26 NOTE — Telephone Encounter (Signed)
Left vm for pt to callback. See MyChart message.

## 2021-05-30 NOTE — Telephone Encounter (Signed)
Pt aware. Pt verbalized understanding and had no additional questions.

## 2021-05-31 NOTE — Progress Notes (Signed)
Subjective:   Kristi [name] is a 65 y.o. female who presents for Medicare Annual (Subsequent) preventive examination.  I connected with Bradleigh today by telephone and verified that I am speaking with the correct person using two identifiers. Location patient: home Location provider: work Persons participating in the virtual visit: patient, Marine scientist.    I discussed the limitations, risks, security and privacy concerns of performing an evaluation and management service by telephone and the availability of in person appointments. I also discussed with the patient that there may be a patient responsible charge related to this service. The patient expressed understanding and verbally consented to this telephonic visit.    Interactive audio and video telecommunications were attempted between this provider and patient, however failed, due to patient having technical difficulties OR patient did not have access to video capability.  We continued and completed visit with audio only.  Some vital signs may be absent or patient reported.   Time Spent with patient on telephone encounter: 20 minutes   Review of Systems     Cardiac Risk Factors include: advanced age (>61mn, >>63women);hypertension;dyslipidemia     Objective:    Today's Vitals   06/01/21 1419  Weight: 155 lb (70.3 kg)  Height: 5' 2"  (1.575 m)  PainSc: 5    Body mass index is 28.35 kg/m.  Advanced Directives 06/01/2021 05/24/2021 06/24/2020 06/19/2020 06/16/2020 06/15/2020 06/15/2020  Does Patient Have a Medical Advance Directive? No No No No No No No  Would patient like information on creating a medical advance directive? No - Patient declined No - Patient declined No - Patient declined No - Patient declined No - Patient declined - No - Patient declined  Pre-existing out of facility DNR order (yellow form or pink MOST form) - - - - - - -    Current Medications (verified) Outpatient Encounter Medications as of 06/01/2021  Medication  Sig   Apoaequorin (PREVAGEN) 10 MG CAPS Take 1 capsule by mouth daily.   aspirin EC 325 MG tablet Take 1 tablet (325 mg total) by mouth daily.   atorvastatin (LIPITOR) 40 MG tablet TAKE 1 TABLET BY MOUTH EVERY DAY   gabapentin (NEURONTIN) 300 MG capsule Take 3 capsules (900 mg total) by mouth at bedtime.   gabapentin (NEURONTIN) 600 MG tablet Take 1 tablet (600 mg total) by mouth 2 (two) times daily.   HYDROcodone-acetaminophen (NORCO) 10-325 MG tablet Take 1 tablet by mouth 3 (three) times daily as needed.   hydrOXYzine (ATARAX/VISTARIL) 50 MG tablet Take 1 tablet (50 mg total) by mouth 2 (two) times daily.   lurasidone (LATUDA) 40 MG TABS tablet Take 40 mg by mouth daily.   Multiple Vitamin (MULTIVITAMIN WITH MINERALS) TABS tablet Take 1 tablet by mouth daily.   polyethylene glycol (MIRALAX / GLYCOLAX) 17 g packet Take 17 g by mouth daily as needed for mild constipation.   pyridOXINE (B-6) 50 MG tablet Take 50 mg by mouth daily.   traZODone (DESYREL) 50 MG tablet Take 1 tablet (50 mg total) by mouth at bedtime.   Turmeric (QC TUMERIC COMPLEX PO) Take 1 tablet by mouth daily.   valsartan (DIOVAN) 160 MG tablet Take 1 tablet (160 mg total) by mouth daily.   [DISCONTINUED] prochlorperazine (COMPAZINE) 10 MG tablet Take 1 tablet (10 mg total) by mouth every 6 (six) hours as needed (Nausea or vomiting).   [DISCONTINUED] prochlorperazine (COMPAZINE) 25 MG suppository Place 1 suppository (25 mg total) rectally every 12 (twelve) hours as needed for nausea.  No facility-administered encounter medications on file as of 06/01/2021.    Allergies (verified) Patient has no known allergies.   History: Past Medical History:  Diagnosis Date   Abnormal gait 12/17/2019   Acute blood loss anemia    Anal fissure 05/03/2011   Anemia    Iron deficinecy anemia   Anemia 05/14/2017   Anxiety and depression 05/15/2014   Arthritis    Body mass index (BMI) 30.0-30.9, adult 12/17/2019   Breast cancer ([place])     left ,last radiation 2'15, last chemo 8'14   Cervical cancer screening 06/18/2018   Menarche at 11 Regular and moderate flow  history of abnormal pap in past, 1 mild abnormality years ago that resolved spontaneously on recheck G1P1, s/p 1 svd history of abnormal MGM, b/o breast cancer 2014 No concerns today  gyn surgeries. Lumpectomy  LMP around early 2014   Cervical stenosis of spine 12/17/2019   Constipation 11/13/2016   Contracture of axilla 05/20/2013   Debility 01/18/2020   Essential (primary) hypertension 12/17/2019   Flushing 02/20/2014   Gait instability 12/17/2019   Genetic testing 02/20/2019   Negative genetic testing on the Comprehensive Cancer Panel.  The Comprehensive Common Cancer Panel offered by [facility] includes sequencing and/or deletion duplication testing of the following 46 genes: APC, ATM, AXIN2, BAP1, BARD1, BMPR1A, BRCA1, BRCA2, BRIP1, CDH1, CDK4, CDKN2A, CHEK2, EPCAM, FANCC, FH, FLCN, HOXB13, MET, MITF,  MLH1, MSH2, MSH6, MUTYH, NBN, NF1, NTHL1,  PALB2, PMS2, POLD1, POLE, P   H/O: CVA (cerebrovascular accident) 06/19/2020   Hereditary and idiopathic peripheral neuropathy 11/11/2013   Herpes zoster 05/15/2014   History of chicken pox    History of left breast cancer 02/15/2017   History of radiation therapy 09/09/13-10/28/13   45 gray to left breast, lumpectomy cavity boosted to 63 gray   HTN (hypertension) 11/13/2016   Hyperglycemia 01/09/2016   Hyperlipidemia 05/14/2017   Hypertension    Internal hemorrhoids with complication 53/66/4403   Knee pain, bilateral 11/14/2016   Left upper extremity numbness 06/19/2018   Major depressive disorder, recurrent episode, severe ([place]) 01/12/2014   Malignant neoplasm of upper-inner quadrant of female breast ([place]) 01/24/2013    Left IDC, ER 3%, PR-, Her2neu-  Formatting of this note might be different from the original. Overview:   Left IDC, ER 3%, PR-, Her2neu-  Last Assessment & Plan:  Pt will continue to take her tamoxifen as  previously directed. She appears to be tolerating this with only c/o moderate, occ hot flashes.   Mild neurocognitive disorder due to multiple etiologies 03/17/2020   MRSA (methicillin resistant Staphylococcus aureus) 2009   right groin area-no issues now. 04-07-14 PCR screen negative today.   Neuropathy    Neuropathy of hand, left 09/21/2015   Nodule of finger of left hand 11/14/2016   Palpitations 07/30/2020   Preventative health care 11/13/2016   Right hip pain 11/14/2016   S/P mastectomy, right 12/26/2018   Sebaceous cyst 04/12/2013   Shoulder joint pain 12/12/2013   Spinal stenosis in cervical region 10/09/2019   Strain of rotator cuff 02/24/2014   Stroke ([place]) 06/2020   Vertigo    Past Surgical History:  Procedure Laterality Date   ANAL SPHINCTEROTOMY  04/2011   APPENDECTOMY  1980   AXILLARY LYMPH NODE DISSECTION Left 02/04/2013   Procedure: LEFT AXILLARY LYMPH NODE DISSECTION;  Surgeon: Stark Klein, MD;  Location: [facility];  Service: General;  Laterality: Left;  End: 4742   BREAST LUMPECTOMY WITH NEEDLE LOCALIZATION Left 02/04/2013   Procedure:  LEFT BREAST LUMPECTOMY WITH NEEDLE LOCALIZATION;  Surgeon: Stark Klein, MD;  Location: [facility];  Service: General;  Laterality: Left;   BREAST SURGERY     Lumpectomy in april 2014   [place]  04/2011   ligation   MASTECTOMY Left 02/15/2017   PORT-A-CATH REMOVAL N/A 04/16/2014   Procedure: REMOVAL PORT-A-CATH;  Surgeon: Stark Klein, MD;  Location: [facility];  Service: General;  Laterality: N/A;   PORTACATH PLACEMENT Right 02/04/2013   Procedure: INSERTION PORT-A-CATH;  Surgeon: Stark Klein, MD;  Location: [facility];  Service: General;  Laterality: Right;  Start Time: 2229.   SHOULDER ARTHROSCOPY WITH ROTATOR CUFF REPAIR AND SUBACROMIAL DECOMPRESSION Left 02/24/2014   Procedure: SHOULDER ARTHROSCOPY WITH ROTATOR CUFF REPAIR AND SUBACROMIAL DECOMPRESSION;  Surgeon: Meredith Pel, MD;  Location: [facility];  Service: Orthopedics;  Laterality: Left;   LEFT SHOULDER DIAGNOSTIC OPERATIVE ARTHROSCOPY, SUBACROMIAL DECOMPRESSION, ROTATOR CUFF TEAR REPAIR   SIMPLE MASTECTOMY WITH AXILLARY SENTINEL NODE BIOPSY Left 02/15/2017   Procedure: LEFT MASTECTOMY;  Surgeon: Stark Klein, MD;  Location: [facility];  Service: General;  Laterality: Left;   TOTAL MASTECTOMY Right 12/26/2018   Procedure: RIGHT BREAST PROPHYLATIC MASTECTOMY;  Surgeon: Stark Klein, MD;  Location: [facility];  Service: General;  Laterality: Right;   Family History  Problem Relation Age of Onset   Lung cancer Father    Hypertension Father    Thyroid cancer Father        dx in his 33s   Cancer Father        lung, thyroid, smoker   Breast cancer Paternal Aunt 5   Colon cancer Paternal Aunt        dx in her 50x   Cervical cancer Paternal Aunt        dzx in her 74s   Ovarian cancer Cousin        dx in her lage 35s   Breast cancer Cousin        maternal first cousin, once removed; dx in her late 62s   Breast cancer Cousin        maternal first cousin once removed; dx in late 85s   Hypertension Mother    Diabetes Mother    Dementia Mother    Memory loss Mother    Hypertension Brother    Seizures Brother        Alcohol induced.   Alcohol abuse Brother        drinker, smoker   COPD Brother    Cancer Paternal Uncle        oral cancer   Kidney cancer Paternal Grandmother    Arthritis Daughter        back surgery   Cancer Cousin        several paternal cousins with brain cancer, leukemia, and other cancers   Cancer Sister        stomach   Social History   Socioeconomic History   Marital status: Married    Spouse name: Kristi [name]   Number of children: 1   Years of education: 12   Highest education level: Not on file  Occupational History   Occupation: INVENTORY Hotel manager: [employer]   Occupation: disability no longer working 2015  Tobacco Use   Smoking status: Never   Smokeless tobacco: Never  Vaping Use   Vaping Use: Never used  Substance  and Sexual Activity   Alcohol use: No   Drug use: No   Sexual activity: Yes  Comment: lives with husband, disability/retirement. [employer], no dietary restrictions  Other Topics Concern   Not on file  Social History Narrative   Patient is married Kristi [name]) and lives at home with her husband.   Patient has one daughter   Patient drinks very little caffeine.   Left handed   Social Determinants of Health   Financial Resource Strain: Low Risk    Difficulty of Paying Living Expenses: Not hard at all  Food Insecurity: No Food Insecurity   Worried About Charity fundraiser in the Last Year: Never true   Ran Out of Food in the Last Year: Never true  Transportation Needs: No Transportation Needs   Lack of Transportation (Medical): No   Lack of Transportation (Non-Medical): No  Physical Activity: Sufficiently Active   Days of Exercise per Week: 7 days   Minutes of Exercise per Session: 60 min  Stress: No Stress Concern Present   Feeling of Stress : Not at all  Social Connections: Moderately Integrated   Frequency of Communication with Friends and Family: More than three times a week   Frequency of Social Gatherings with Friends and Family: More than three times a week   Attends Religious Services: More than 4 times per year   Active Member of [employer] or Organizations: No   Attends Music therapist: Never   Marital Status: Married    Tobacco Counseling Counseling given: Not Answered   Clinical Intake:  Pre-visit preparation completed: Yes  Pain : 0-10 Pain Score: 5  Pain Type: Chronic pain Pain Location: Hand Pain Orientation: Right, Left Pain Onset: More than a month ago Pain Frequency: Constant     Nutritional Status: BMI 25 -29 Overweight Nutritional Risks: None Diabetes: No  How often do you need to have someone help you when you read instructions, pamphlets, or other written materials from your doctor or pharmacy?: 1 -  Never  Diabetic?No  Interpreter Needed?: No  Information entered by :: Caroleen Hamman LPN   Activities of Daily Living In your present state of health, do you have any difficulty performing the following activities: 06/01/2021 06/19/2020  Hearing? N N  Vision? N N  Difficulty concentrating or making decisions? N N  Walking or climbing stairs? N Y  Dressing or bathing? N N  Doing errands, shopping? N N  Preparing Food and eating ? N -  Using the Toilet? N -  In the past six months, have you accidently leaked urine? N -  Do you have problems with loss of bowel control? N -  Managing your Medications? N -  Managing your Finances? N -  Housekeeping or managing your Housekeeping? N -  Some recent data might be hidden    Patient Care Team: Mosie Lukes, MD as PCP - General (Family Medicine) Nicholas Lose, MD as Consulting Physician (Hematology and Oncology) Lin Landsman, MD as Consulting Physician (Family Medicine) Inocencio Homes, [place] as Consulting Physician (Podiatry) Gery Pray, MD as Consulting Physician (Radiation Oncology) Dan Humphreys as Consulting Physician (Nurse Practitioner)  Indicate any recent Medical Services you may have received from other than [facility] providers in the past year (date may be approximate).     Assessment:   This is a routine wellness examination for Kristi [name].  Hearing/Vision screen Hearing Screening - Comments:: No issues Vision Screening - Comments:: Wears glasses Last eye exam-03/2020-[facility]  Dietary issues and exercise activities discussed: Current Exercise Habits: Home exercise routine, Type of exercise: walking, Time (Minutes):  60, Frequency (Times/Week): 7, Weekly Exercise (Minutes/Week): 420, Intensity: Mild, Exercise limited by: None identified   Goals Addressed             This Visit's Progress    Increase physical activity   On track      Depression Screen PHQ 2/9 Scores 06/01/2021 04/21/2021 03/08/2021 11/10/2020 06/30/2020  05/26/2020 05/05/2020  PHQ - 2 Score 0 0 2 2 2  0 3  PHQ- 9 Score - - 3 - - - 13  Some encounter information is confidential and restricted. Go to Review Flowsheets activity to see all data.    Fall Risk Fall Risk  06/01/2021 04/21/2021 03/08/2021 02/23/2021 01/25/2021  Falls in the past year? 0 0 0 0 0  Number falls in past yr: 0 - 0 - -  Injury with Fall? 0 - 0 - -  Follow up Falls prevention discussed - - - -    FALL RISK PREVENTION PERTAINING TO THE HOME:  Any stairs in or around the home? No  Home free of loose throw rugs in walkways, pet beds, electrical cords, etc? Yes  Adequate lighting in your home to reduce risk of falls? Yes   ASSISTIVE DEVICES UTILIZED TO PREVENT FALLS:  Life alert? No  Use of a cane, walker or w/c? No  Grab bars in the bathroom? No  Shower chair or bench in shower? No  Elevated toilet seat or a handicapped toilet? No   TIMED UP AND GO:  Was the test performed? No . Phone visit   Cognitive Function: MMSE - Mini Mental State Exam 12/08/2019 04/30/2018  Orientation to time 5 5  Orientation to Place 5 5  Registration 3 3  Attention/ Calculation 0 5  Recall 3 1  Language- name 2 objects 2 2  Language- repeat 1 1  Language- follow 3 step command 2 3  Language- read & follow direction 1 1  Write a sentence 1 1  Copy design 1 1  Total score 24 28        Immunizations Immunization History  Administered Date(s) Administered   Influenza Inj Mdck Quad Pf 07/29/2018   Influenza-Unspecified 07/15/2019   PFIZER(Purple Top)SARS-COV-2 Vaccination 01/30/2020, 02/24/2020, 07/09/2020, 11/25/2020    TDAP status: Up to date  Flu Vaccine status: Up to date  Pneumococcal vaccine status: Due, Education has been provided regarding the importance of this vaccine. Advised may receive this vaccine at local pharmacy or Health Dept. Aware to provide a copy of the vaccination record if obtained from local pharmacy or Health Dept. Verbalized acceptance and  understanding.  Covid-19 vaccine status: Completed vaccines  Qualifies for Shingles Vaccine? Yes   Zostavax completed No   Shingrix Completed?: No.    Education has been provided regarding the importance of this vaccine. Patient has been advised to call insurance company to determine out of pocket expense if they have not yet received this vaccine. Advised may also receive vaccine at local pharmacy or Health Dept. Verbalized acceptance and understanding.  Screening Tests Health Maintenance  Topic Date Due   Hepatitis C Screening  Never done   TETANUS/TDAP  Never done   Zoster Vaccines- Shingrix (1 of 2) Never done   PNA vac Low Risk Adult (1 of 2 - PCV13) Never done   MAMMOGRAM  12/05/2020   COVID-19 Vaccine (5 - Booster for Pfizer series) 03/25/2021   INFLUENZA VACCINE  06/06/2021   COLONOSCOPY (Pts 45-36yr Insurance coverage will need to be confirmed)  02/25/2025   DEXA SCAN  Completed   HPV VACCINES  Aged Out    Health Maintenance  Health Maintenance Due  Topic Date Due   Hepatitis C Screening  Never done   TETANUS/TDAP  Never done   Zoster Vaccines- Shingrix (1 of 2) Never done   PNA vac Low Risk Adult (1 of 2 - PCV13) Never done   MAMMOGRAM  12/05/2020   COVID-19 Vaccine (5 - Booster for Pfizer series) 03/25/2021    Colorectal cancer screening: Type of screening: Colonoscopy. Completed 06/02/2020. Repeat every 5 years  Mammogram status: No longer required due to Bilateral Mastectomy.  Bone Density status: Completed 03/17/2021. Results reflect: Bone density results: NORMAL. Repeat every 2 years.  Lung Cancer Screening: (Low Dose CT Chest recommended if Age 65-80 years, 30 pack-year currently smoking OR have quit w/in 15years.) does not qualify.     Additional Screening:  Hepatitis C Screening: does qualify; Discuss with PCP  Vision Screening: Recommended annual ophthalmology exams for early detection of glaucoma and other disorders of the eye. Is the patient up to  date with their annual eye exam?  No  Who is the provider or what is the name of the office in which the patient attends annual eye exams? [facility] Patient plans to make an appt soon  Dental Screening: Recommended annual dental exams for proper oral hygiene  Community Resource Referral / Chronic Care Management: CRR required this visit?  No   CCM required this visit?  No      Plan:     I have personally reviewed and noted the following in the patient's chart:   Medical and social history Use of alcohol, tobacco or illicit drugs  Current medications and supplements including opioid prescriptions.  Functional ability and status Nutritional status Physical activity Advanced directives List of other physicians Hospitalizations, surgeries, and ER visits in previous 12 months Vitals Screenings to include cognitive, depression, and falls Referrals and appointments  In addition, I have reviewed and discussed with patient certain preventive protocols, quality metrics, and best practice recommendations. A written personalized care plan for preventive services as well as general preventive health recommendations were provided to patient.   Due to this being a telephonic visit, the after visit summary with patients personalized plan was offered to patient via mail or my-chart. Patient would like to access on my-chart.   Marta Antu, LPN   6/43/3295  Nurse Health Advisor  Nurse Notes: None

## 2021-06-01 ENCOUNTER — Ambulatory Visit (INDEPENDENT_AMBULATORY_CARE_PROVIDER_SITE_OTHER): Payer: Medicare Other

## 2021-06-01 VITALS — Ht 62.0 in | Wt 155.0 lb

## 2021-06-01 DIAGNOSIS — Z0000 Encounter for general adult medical examination without abnormal findings: Secondary | ICD-10-CM

## 2021-06-01 NOTE — Patient Instructions (Signed)
Kristi [name] , Thank you for taking time to complete your Medicare Wellness Visit. I appreciate your ongoing commitment to your health goals. Please review the following plan we discussed and let me know if I can assist you in the future.   Screening recommendations/referrals: Colonoscopy: Completed 06/02/2020-Due 06/02/2025 Mammogram: No longer required Bone Density: Completed 03/17/2021-Due 03/18/2023 Recommended yearly ophthalmology/optometry visit for glaucoma screening and checkup Recommended yearly dental visit for hygiene and checkup  Vaccinations: Influenza vaccine: Up to date Pneumococcal vaccine: Due-May obtain vaccine at our office or your local pharmacy Tdap vaccine: Discuss with pharmacy Shingles vaccine: Discuss with pharmacy   Covid-19:Up to date  Advanced directives: Declined infomation today  Conditions/risks identified: See problem list  Next appointment: Follow up in one year for your annual wellness visit 06/07/2022 @ 2:20   Preventive Care 65 Years and Older, Female Preventive care refers to lifestyle choices and visits with your health care provider that can promote health and wellness. What does preventive care include? A yearly physical exam. This is also called an annual well check. Dental exams once or twice a year. Routine eye exams. Ask your health care provider how often you should have your eyes checked. Personal lifestyle choices, including: Daily care of your teeth and gums. Regular physical activity. Eating a healthy diet. Avoiding tobacco and drug use. Limiting alcohol use. Practicing safe sex. Taking low-dose aspirin every day. Taking vitamin and mineral supplements as recommended by your health care provider. What happens during an annual well check? The services and screenings done by your health care provider during your annual well check will depend on your age, overall health, lifestyle risk factors, and family history of disease. Counseling   Your health care provider may ask you questions about your: Alcohol use. Tobacco use. Drug use. Emotional well-being. Home and relationship well-being. Sexual activity. Eating habits. History of falls. Memory and ability to understand (cognition). Work and work Statistician. Reproductive health. Screening  You may have the following tests or measurements: Height, weight, and BMI. Blood pressure. Lipid and cholesterol levels. These may be checked every 5 years, or more frequently if you are over 70 years old. Skin check. Lung cancer screening. You may have this screening every year starting at age 58 if you have a 30-pack-year history of smoking and currently smoke or have quit within the past 15 years. Fecal occult blood test (FOBT) of the stool. You may have this test every year starting at age 29. Flexible sigmoidoscopy or colonoscopy. You may have a sigmoidoscopy every 5 years or a colonoscopy every 10 years starting at age 38. Hepatitis C blood test. Hepatitis B blood test. Sexually transmitted disease (STD) testing. Diabetes screening. This is done by checking your blood sugar (glucose) after you have not eaten for a while (fasting). You may have this done every 1-3 years. Bone density scan. This is done to screen for osteoporosis. You may have this done starting at age 76. Mammogram. This may be done every 1-2 years. Talk to your health care provider about how often you should have regular mammograms. Talk with your health care provider about your test results, treatment options, and if necessary, the need for more tests. Vaccines  Your health care provider may recommend certain vaccines, such as: Influenza vaccine. This is recommended every year. Tetanus, diphtheria, and acellular pertussis (Tdap, Td) vaccine. You may need a Td booster every 10 years. Zoster vaccine. You may need this after age 36. Pneumococcal 13-valent conjugate (PCV13) vaccine. One dose  is recommended  after age 49. Pneumococcal polysaccharide (PPSV23) vaccine. One dose is recommended after age 44. Talk to your health care provider about which screenings and vaccines you need and how often you need them. This information is not intended to replace advice given to you by your health care provider. Make sure you discuss any questions you have with your health care provider. Document Released: 11/19/2015 Document Revised: 07/12/2016 Document Reviewed: 08/24/2015 Elsevier Interactive Patient Education  2017 [place] Prevention in the Home Falls can cause injuries. They can happen to people of all ages. There are many things you can do to make your home safe and to help prevent falls. What can I do on the outside of my home? Regularly fix the edges of walkways and driveways and fix any cracks. Remove anything that might make you trip as you walk through a door, such as a raised step or threshold. Trim any bushes or trees on the path to your home. Use bright outdoor lighting. Clear any walking paths of anything that might make someone trip, such as rocks or tools. Regularly check to see if handrails are loose or broken. Make sure that both sides of any steps have handrails. Any raised decks and porches should have guardrails on the edges. Have any leaves, snow, or ice cleared regularly. Use sand or salt on walking paths during winter. Clean up any spills in your garage right away. This includes oil or grease spills. What can I do in the bathroom? Use night lights. Install grab bars by the toilet and in the tub and shower. Do not use towel bars as grab bars. Use non-skid mats or decals in the tub or shower. If you need to sit down in the shower, use a plastic, non-slip stool. Keep the floor dry. Clean up any water that spills on the floor as soon as it happens. Remove soap buildup in the tub or shower regularly. Attach bath mats securely with double-sided non-slip rug tape. Do not  have throw rugs and other things on the floor that can make you trip. What can I do in the bedroom? Use night lights. Make sure that you have a light by your bed that is easy to reach. Do not use any sheets or blankets that are too big for your bed. They should not hang down onto the floor. Have a firm chair that has side arms. You can use this for support while you get dressed. Do not have throw rugs and other things on the floor that can make you trip. What can I do in the kitchen? Clean up any spills right away. Avoid walking on wet floors. Keep items that you use a lot in easy-to-reach places. If you need to reach something above you, use a strong step stool that has a grab bar. Keep electrical cords out of the way. Do not use floor polish or wax that makes floors slippery. If you must use wax, use non-skid floor wax. Do not have throw rugs and other things on the floor that can make you trip. What can I do with my stairs? Do not leave any items on the stairs. Make sure that there are handrails on both sides of the stairs and use them. Fix handrails that are broken or loose. Make sure that handrails are as long as the stairways. Check any carpeting to make sure that it is firmly attached to the stairs. Fix any carpet that is loose or worn. Avoid  having throw rugs at the top or bottom of the stairs. If you do have throw rugs, attach them to the floor with carpet tape. Make sure that you have a light switch at the top of the stairs and the bottom of the stairs. If you do not have them, ask someone to add them for you. What else can I do to help prevent falls? Wear shoes that: Do not have high heels. Have rubber bottoms. Are comfortable and fit you well. Are closed at the toe. Do not wear sandals. If you use a stepladder: Make sure that it is fully opened. Do not climb a closed stepladder. Make sure that both sides of the stepladder are locked into place. Ask someone to hold it for  you, if possible. Clearly mark and make sure that you can see: Any grab bars or handrails. First and last steps. Where the edge of each step is. Use tools that help you move around (mobility aids) if they are needed. These include: Canes. Walkers. Scooters. Crutches. Turn on the lights when you go into a dark area. Replace any light bulbs as soon as they burn out. Set up your furniture so you have a clear path. Avoid moving your furniture around. If any of your floors are uneven, fix them. If there are any pets around you, be aware of where they are. Review your medicines with your doctor. Some medicines can make you feel dizzy. This can increase your chance of falling. Ask your doctor what other things that you can do to help prevent falls. This information is not intended to replace advice given to you by your health care provider. Make sure you discuss any questions you have with your health care provider. Document Released: 08/19/2009 Document Revised: 03/30/2016 Document Reviewed: 11/27/2014 Elsevier Interactive Patient Education  2017 [employer].

## 2021-06-13 ENCOUNTER — Ambulatory Visit: Payer: Medicare Other | Admitting: Physical Therapy

## 2021-06-16 ENCOUNTER — Other Ambulatory Visit: Payer: Self-pay

## 2021-06-16 ENCOUNTER — Encounter: Payer: Medicare Other | Attending: Physical Medicine and Rehabilitation | Admitting: Registered Nurse

## 2021-06-16 VITALS — BP 133/74 | HR 65 | Temp 98.4°F | Ht 62.0 in | Wt 157.4 lb

## 2021-06-16 DIAGNOSIS — G894 Chronic pain syndrome: Secondary | ICD-10-CM | POA: Insufficient documentation

## 2021-06-16 DIAGNOSIS — T451X5A Adverse effect of antineoplastic and immunosuppressive drugs, initial encounter: Secondary | ICD-10-CM

## 2021-06-16 DIAGNOSIS — G620 Drug-induced polyneuropathy: Secondary | ICD-10-CM | POA: Diagnosis not present

## 2021-06-16 DIAGNOSIS — M2550 Pain in unspecified joint: Secondary | ICD-10-CM | POA: Diagnosis not present

## 2021-06-16 DIAGNOSIS — Z5181 Encounter for therapeutic drug level monitoring: Secondary | ICD-10-CM | POA: Insufficient documentation

## 2021-06-16 DIAGNOSIS — Z79891 Long term (current) use of opiate analgesic: Secondary | ICD-10-CM

## 2021-06-16 DIAGNOSIS — M659 Synovitis and tenosynovitis, unspecified: Secondary | ICD-10-CM | POA: Insufficient documentation

## 2021-06-16 MED ORDER — GABAPENTIN 300 MG PO CAPS: 900.0000 mg | ORAL_CAPSULE | Freq: Every day | ORAL | 3 refills | Status: DC

## 2021-06-16 MED ORDER — GABAPENTIN 600 MG PO TABS: 600.0000 mg | ORAL_TABLET | Freq: Two times a day (BID) | ORAL | 3 refills | Status: DC

## 2021-06-16 MED ORDER — HYDROCODONE-ACETAMINOPHEN 10-325 MG PO TABS: 1.0000 | ORAL_TABLET | Freq: Three times a day (TID) | ORAL | 0 refills | Status: DC | PRN

## 2021-06-16 NOTE — Progress Notes (Signed)
Subjective:    Patient ID: Kristi [name], female    DOB: 1956-04-07, 65 y.o.   MRN: [id number]  HPI: Kristi [name] is a 65 y.o. female who returns for follow up appointment for chronic pain and medication refill. She states her pain is located in her bilateral wrist and bilateral feet with tingling and numbness. Also reports generalized joint pain.She rates her pain 6. Her current exercise regime is walking and performing stretching exercises.  Ms. Badour Morphine equivalent is 30.00 MME.   Last UDS was Performed on 04/21/2021, it was consistent.    Pain Inventory Average Pain 6 Pain Right Now 6 My pain is sharp, burning, and aching  In the last 24 hours, has pain interfered with the following? General activity 4 Relation with others 4 Enjoyment of life 4 What TIME of day is your pain at its worst? morning , daytime, evening, and night Sleep (in general) Good  Pain is worse with: walking, sitting, inactivity, and standing Pain improves with: medication Relief from Meds: 6  Family History  Problem Relation Age of Onset   Lung cancer Father    Hypertension Father    Thyroid cancer Father        dx in his 38s   Cancer Father        lung, thyroid, smoker   Breast cancer Paternal Aunt 64   Colon cancer Paternal Aunt        dx in her 50x   Cervical cancer Paternal Aunt        dzx in her 38s   Ovarian cancer Cousin        dx in her lage 64s   Breast cancer Cousin        maternal first cousin, once removed; dx in her late 74s   Breast cancer Cousin        maternal first cousin once removed; dx in late 46s   Hypertension Mother    Diabetes Mother    Dementia Mother    Memory loss Mother    Hypertension Brother    Seizures Brother        Alcohol induced.   Alcohol abuse Brother        drinker, smoker   COPD Brother    Cancer Paternal Uncle        oral cancer   Kidney cancer Paternal Grandmother    Arthritis Daughter        back surgery   Cancer Cousin         several paternal cousins with brain cancer, leukemia, and other cancers   Cancer Sister        stomach   Social History   Socioeconomic History   Marital status: Married    Spouse name: [name]   Number of children: 1   Years of education: 12   Highest education level: Not on file  Occupational History   Occupation: INVENTORY Hotel manager: [employer]   Occupation: disability no longer working 2015  Tobacco Use   Smoking status: Never   Smokeless tobacco: Never  Vaping Use   Vaping Use: Never used  Substance and Sexual Activity   Alcohol use: No   Drug use: No   Sexual activity: Yes    Comment: lives with husband, disability/retirement. [employer], no dietary restrictions  Other Topics Concern   Not on file  Social History Narrative   Patient is married [name]) and lives at home with  her husband.   Patient has one daughter   Patient drinks very little caffeine.   Left handed   Social Determinants of Health   Financial Resource Strain: Low Risk    Difficulty of Paying Living Expenses: Not hard at all  Food Insecurity: No Food Insecurity   Worried About Charity fundraiser in the Last Year: Never true   Ran Out of Food in the Last Year: Never true  Transportation Needs: No Transportation Needs   Lack of Transportation (Medical): No   Lack of Transportation (Non-Medical): No  Physical Activity: Sufficiently Active   Days of Exercise per Week: 7 days   Minutes of Exercise per Session: 60 min  Stress: No Stress Concern Present   Feeling of Stress : Not at all  Social Connections: Moderately Integrated   Frequency of Communication with Friends and Family: More than three times a week   Frequency of Social Gatherings with Friends and Family: More than three times a week   Attends Religious Services: More than 4 times per year   Active Member of Clubs or Organizations: No   Attends Archivist Meetings: Never   Marital Status:  Married   Past Surgical History:  Procedure Laterality Date   ANAL SPHINCTEROTOMY  04/2011   [place] Left 02/04/2013   Procedure: LEFT AXILLARY LYMPH NODE DISSECTION;  Surgeon: Stark Klein, MD;  Location: [facility];  Service: General;  Laterality: Left;  End: 1512   BREAST LUMPECTOMY WITH NEEDLE LOCALIZATION Left 02/04/2013   Procedure: LEFT BREAST LUMPECTOMY WITH NEEDLE LOCALIZATION;  Surgeon: Stark Klein, MD;  Location: [facility];  Service: General;  Laterality: Left;   BREAST SURGERY     Lumpectomy in april 2014   [place]  04/2011   ligation   MASTECTOMY Left 02/15/2017   PORT-A-CATH REMOVAL N/A 04/16/2014   Procedure: REMOVAL PORT-A-CATH;  Surgeon: Stark Klein, MD;  Location: [facility];  Service: General;  Laterality: N/A;   PORTACATH PLACEMENT Right 02/04/2013   Procedure: INSERTION PORT-A-CATH;  Surgeon: Stark Klein, MD;  Location: [facility];  Service: General;  Laterality: Right;  Start Time: 2482.   SHOULDER ARTHROSCOPY WITH ROTATOR CUFF REPAIR AND SUBACROMIAL DECOMPRESSION Left 02/24/2014   Procedure: SHOULDER ARTHROSCOPY WITH ROTATOR CUFF REPAIR AND SUBACROMIAL DECOMPRESSION;  Surgeon: Meredith Pel, MD;  Location: [facility];  Service: Orthopedics;  Laterality: Left;  LEFT SHOULDER DIAGNOSTIC OPERATIVE ARTHROSCOPY, SUBACROMIAL DECOMPRESSION, ROTATOR CUFF TEAR REPAIR   SIMPLE MASTECTOMY WITH AXILLARY SENTINEL NODE BIOPSY Left 02/15/2017   Procedure: LEFT MASTECTOMY;  Surgeon: Stark Klein, MD;  Location: [facility];  Service: General;  Laterality: Left;   TOTAL MASTECTOMY Right 12/26/2018   Procedure: RIGHT BREAST PROPHYLATIC MASTECTOMY;  Surgeon: Stark Klein, MD;  Location: [facility];  Service: General;  Laterality: Right;   Past Surgical History:  Procedure Laterality Date   ANAL SPHINCTEROTOMY  04/2011   APPENDECTOMY  1980   AXILLARY LYMPH NODE DISSECTION Left 02/04/2013   Procedure: LEFT AXILLARY LYMPH NODE DISSECTION;  Surgeon: Stark Klein, MD;   Location: [facility];  Service: General;  Laterality: Left;  End: 5003   BREAST LUMPECTOMY WITH NEEDLE LOCALIZATION Left 02/04/2013   Procedure: LEFT BREAST LUMPECTOMY WITH NEEDLE LOCALIZATION;  Surgeon: Stark Klein, MD;  Location: [facility];  Service: General;  Laterality: Left;   BREAST SURGERY     Lumpectomy in april 2014   [place]  04/2011   ligation   MASTECTOMY Left 02/15/2017  PORT-A-CATH REMOVAL N/A 04/16/2014   Procedure: REMOVAL PORT-A-CATH;  Surgeon: Stark Klein, MD;  Location[facility];  Service: General;  Laterality: N/A;   PORTACATH PLACEMENT Right 02/04/2013   Procedure: INSERTION PORT-A-CATH;  Surgeon: Stark Klein, MD;  Location: [facility];  Service: General;  Laterality: Right;  Start Time: 6213.   SHOULDER ARTHROSCOPY WITH ROTATOR CUFF REPAIR AND SUBACROMIAL DECOMPRESSION Left 02/24/2014   Procedure: SHOULDER ARTHROSCOPY WITH ROTATOR CUFF REPAIR AND SUBACROMIAL DECOMPRESSION;  Surgeon: Meredith Pel, MD;  Location: [facility];  Service: Orthopedics;  Laterality: Left;  LEFT SHOULDER DIAGNOSTIC OPERATIVE ARTHROSCOPY, SUBACROMIAL DECOMPRESSION, ROTATOR CUFF TEAR REPAIR   SIMPLE MASTECTOMY WITH AXILLARY SENTINEL NODE BIOPSY Left 02/15/2017   Procedure: LEFT MASTECTOMY;  Surgeon: Stark Klein, MD;  Location: [facility];  Service: General;  Laterality: Left;   TOTAL MASTECTOMY Right 12/26/2018   Procedure: RIGHT BREAST PROPHYLATIC MASTECTOMY;  Surgeon: Stark Klein, MD;  Location: [facility];  Service: General;  Laterality: Right;   Past Medical History:  Diagnosis Date   Abnormal gait 12/17/2019   Acute blood loss anemia    Anal fissure 05/03/2011   Anemia    Iron deficinecy anemia   Anemia 05/14/2017   Anxiety and depression 05/15/2014   Arthritis    Body mass index (BMI) 30.0-30.9, adult 12/17/2019   Breast cancer ([place])    left ,last radiation 2'15, last chemo 8'14   Cervical cancer screening 06/18/2018   Menarche at 11 Regular and moderate flow  history of abnormal pap in past, 1 mild  abnormality years ago that resolved spontaneously on recheck G1P1, s/p 1 svd history of abnormal MGM, b/o breast cancer 2014 No concerns today  gyn surgeries. Lumpectomy  LMP around early 2014   Cervical stenosis of spine 12/17/2019   Constipation 11/13/2016   Contracture of axilla 05/20/2013   Debility 01/18/2020   Essential (primary) hypertension 12/17/2019   Flushing 02/20/2014   Gait instability 12/17/2019   Genetic testing 02/20/2019   Negative genetic testing on the Comprehensive Cancer Panel.  The Comprehensive Common Cancer Panel offered by [facility] includes sequencing and/or deletion duplication testing of the following 46 genes: APC, ATM, AXIN2, BAP1, BARD1, BMPR1A, BRCA1, BRCA2, BRIP1, CDH1, CDK4, CDKN2A, CHEK2, EPCAM, FANCC, FH, FLCN, HOXB13, MET, MITF,  MLH1, MSH2, MSH6, MUTYH, NBN, NF1, NTHL1,  PALB2, PMS2, POLD1, POLE, P   H/O: CVA (cerebrovascular accident) 06/19/2020   Hereditary and idiopathic peripheral neuropathy 11/11/2013   Herpes zoster 05/15/2014   History of chicken pox    History of left breast cancer 02/15/2017   History of radiation therapy 09/09/13-10/28/13   45 gray to left breast, lumpectomy cavity boosted to 63 gray   HTN (hypertension) 11/13/2016   Hyperglycemia 01/09/2016   Hyperlipidemia 05/14/2017   Hypertension    Internal hemorrhoids with complication 08/65/7846   Knee pain, bilateral 11/14/2016   Left upper extremity numbness 06/19/2018   Major depressive disorder, recurrent episode, severe ([place]) 01/12/2014   Malignant neoplasm of upper-inner quadrant of female breast ([place]) 01/24/2013    Left IDC, ER 3%, PR-, Her2neu-  Formatting of this note might be different from the original. Overview:   Left IDC, ER 3%, PR-, Her2neu-  Last Assessment & Plan:  Pt will continue to take her tamoxifen as previously directed. She appears to be tolerating this with only c/o moderate, occ hot flashes.   Mild neurocognitive disorder due to multiple etiologies 03/17/2020    MRSA (methicillin resistant Staphylococcus aureus) 2009   right groin area-no issues now. 04-07-14 PCR  screen negative today.   Neuropathy    Neuropathy of hand, left 09/21/2015   Nodule of finger of left hand 11/14/2016   Palpitations 07/30/2020   Preventative health care 11/13/2016   Right hip pain 11/14/2016   S/P mastectomy, right 12/26/2018   Sebaceous cyst 04/12/2013   Shoulder joint pain 12/12/2013   Spinal stenosis in cervical region 10/09/2019   Strain of rotator cuff 02/24/2014   Stroke ([place]) 06/2020   Vertigo    BP 133/74   Pulse 65   Temp 98.4 F (36.9 C)   Ht $R'5\' 2"'WQ$  (1.575 m)   Wt 157 lb 6.4 oz (71.4 kg)   LMP 01/20/2013   BMI 28.79 kg/m   Opioid Risk Score:   Fall Risk Score:  `1  Depression screen PHQ 2/9  Depression screen [facility] 2/9 06/01/2021 04/21/2021 03/08/2021 11/10/2020 06/30/2020 05/26/2020 05/05/2020  Decreased Interest 0 0 $R'1 1 1 'jb$ 0 2  Down, Depressed, Hopeless 0 0 $R'1 1 1 'RE$ 0 1  PHQ - 2 Score 0 0 $R'2 2 2 'ST$ 0 3  Altered sleeping - - 0 - - - 2  Tired, decreased energy - - 1 - - - 3  Change in appetite - - 0 - - - 2  Feeling bad or failure about yourself  - - 0 - - - 1  Trouble concentrating - - 0 - - - 1  Moving slowly or fidgety/restless - - 0 - - - 1  Suicidal thoughts - - 0 - - - 0  PHQ-9 Score - - 3 - - - 13  Some recent data might be hidden     Review of Systems  Constitutional: Negative.   HENT: Negative.    Eyes: Negative.   Respiratory: Negative.    Cardiovascular: Negative.   Gastrointestinal: Negative.   Endocrine: Negative.   Genitourinary: Negative.   Musculoskeletal:  Positive for arthralgias and myalgias.  Skin: Negative.   Allergic/Immunologic: Negative.   Neurological: Negative.   Hematological: Negative.   Psychiatric/Behavioral: Negative.    All other systems reviewed and are negative.     Objective:   Physical Exam Vitals and nursing note reviewed.  Constitutional:      Appearance: Normal appearance.  Cardiovascular:     Rate and  Rhythm: Normal rate and regular rhythm.     Pulses: Normal pulses.     Heart sounds: Normal heart sounds.  Pulmonary:     Effort: Pulmonary effort is normal.     Breath sounds: Normal breath sounds.  Musculoskeletal:     Cervical back: Normal range of motion and neck supple.     Comments: Normal Muscle Bulk and Muscle Testing Reveals:  Upper Extremities: Full ROM and Muscle Strength 5/5 Lower Extremities: Full ROM and Muscle Strength 5/5 Arises From Table with ease Narrow Based  Gait     Skin:    General: Skin is warm and dry.  Neurological:     Mental Status: She is alert and oriented to person, place, and time.  Psychiatric:        Mood and Affect: Mood normal.        Behavior: Behavior normal.         Assessment & Plan:  1. Chemotherapy Induced Peripheral Neuropathy: Continue current medication regimen with Gabapentin. Continue to Monitor. 06/16/2021 2. Chronic Pain Syndrome: Refilled Hydrocodone 10/325 mg one tablet three times a day as needed. #90. 06/16/2021 We will continue the opioid monitoring program, this consists of regular clinic visits, examinations, urine  drug screen, pill counts as well as use of [place] Controlled Substance Reporting system. A 12 month History has been reviewed on the [place] Controlled Substance Reporting System on 06/16/2021 3.Tenosynovitis of bilateral wrist: Schedule for Lidocaine or Cortisone Injection with Dr Ranell Patrick 4. Polyarthralgia: Continue HEP as Tolerated: Continue to Monitor.   F/U in 1 month

## 2021-06-20 ENCOUNTER — Encounter: Payer: Medicare Other | Admitting: Physical Therapy

## 2021-06-20 ENCOUNTER — Other Ambulatory Visit: Payer: Self-pay | Admitting: Physical Medicine and Rehabilitation

## 2021-06-21 ENCOUNTER — Encounter: Payer: Self-pay | Admitting: Registered Nurse

## 2021-06-22 ENCOUNTER — Encounter: Payer: Medicare Other | Admitting: Physical Therapy

## 2021-06-24 ENCOUNTER — Encounter: Payer: Medicare Other | Admitting: Physical Therapy

## 2021-07-01 ENCOUNTER — Encounter: Payer: Medicare Other | Admitting: Physical Therapy

## 2021-07-04 ENCOUNTER — Encounter: Payer: Medicare Other | Admitting: Physical Therapy

## 2021-07-13 NOTE — Progress Notes (Deleted)
Subjective:    Patient ID: Kristi [name], female    DOB: 1956-04-07, 65 y.o.   MRN: [id number]  HPI: Kristi [name] is a 65 y.o. female who returns for follow up appointment for chronic pain and medication refill. She states her pain is located in her bilateral wrist and bilateral feet with tingling and numbness. Also reports generalized joint pain.She rates her pain 6. Her current exercise regime is walking and performing stretching exercises.  Ms. Badour Morphine equivalent is 30.00 MME.   Last UDS was Performed on 04/21/2021, it was consistent.    Pain Inventory Average Pain 6 Pain Right Now 6 My pain is sharp, burning, and aching  In the last 24 hours, has pain interfered with the following? General activity 4 Relation with others 4 Enjoyment of life 4 What TIME of day is your pain at its worst? morning , daytime, evening, and night Sleep (in general) Good  Pain is worse with: walking, sitting, inactivity, and standing Pain improves with: medication Relief from Meds: 6  Family History  Problem Relation Age of Onset   Lung cancer Father    Hypertension Father    Thyroid cancer Father        dx in his 38s   Cancer Father        lung, thyroid, smoker   Breast cancer Paternal Aunt 64   Colon cancer Paternal Aunt        dx in her 50x   Cervical cancer Paternal Aunt        dzx in her 38s   Ovarian cancer Cousin        dx in her lage 64s   Breast cancer Cousin        maternal first cousin, once removed; dx in her late 74s   Breast cancer Cousin        maternal first cousin once removed; dx in late 46s   Hypertension Mother    Diabetes Mother    Dementia Mother    Memory loss Mother    Hypertension Brother    Seizures Brother        Alcohol induced.   Alcohol abuse Brother        drinker, smoker   COPD Brother    Cancer Paternal Uncle        oral cancer   Kidney cancer Paternal Grandmother    Arthritis Daughter        back surgery   Cancer Cousin         several paternal cousins with brain cancer, leukemia, and other cancers   Cancer Sister        stomach   Social History   Socioeconomic History   Marital status: Married    Spouse name: [name]   Number of children: 1   Years of education: 12   Highest education level: Not on file  Occupational History   Occupation: INVENTORY Hotel manager: [employer]   Occupation: disability no longer working 2015  Tobacco Use   Smoking status: Never   Smokeless tobacco: Never  Vaping Use   Vaping Use: Never used  Substance and Sexual Activity   Alcohol use: No   Drug use: No   Sexual activity: Yes    Comment: lives with husband, disability/retirement. [employer], no dietary restrictions  Other Topics Concern   Not on file  Social History Narrative   Patient is married [name]) and lives at home with  her husband.   Patient has one daughter   Patient drinks very little caffeine.   Left handed   Social Determinants of Health   Financial Resource Strain: Low Risk    Difficulty of Paying Living Expenses: Not hard at all  Food Insecurity: No Food Insecurity   Worried About Charity fundraiser in the Last Year: Never true   Ran Out of Food in the Last Year: Never true  Transportation Needs: No Transportation Needs   Lack of Transportation (Medical): No   Lack of Transportation (Non-Medical): No  Physical Activity: Sufficiently Active   Days of Exercise per Week: 7 days   Minutes of Exercise per Session: 60 min  Stress: No Stress Concern Present   Feeling of Stress : Not at all  Social Connections: Moderately Integrated   Frequency of Communication with Friends and Family: More than three times a week   Frequency of Social Gatherings with Friends and Family: More than three times a week   Attends Religious Services: More than 4 times per year   Active Member of Clubs or Organizations: No   Attends Archivist Meetings: Never   Marital Status:  Married   Past Surgical History:  Procedure Laterality Date   ANAL SPHINCTEROTOMY  04/2011   [place] Left 02/04/2013   Procedure: LEFT AXILLARY LYMPH NODE DISSECTION;  Surgeon: Stark Klein, MD;  Location: [facility];  Service: General;  Laterality: Left;  End: 1512   BREAST LUMPECTOMY WITH NEEDLE LOCALIZATION Left 02/04/2013   Procedure: LEFT BREAST LUMPECTOMY WITH NEEDLE LOCALIZATION;  Surgeon: Stark Klein, MD;  Location: [facility];  Service: General;  Laterality: Left;   BREAST SURGERY     Lumpectomy in april 2014   [place]  04/2011   ligation   MASTECTOMY Left 02/15/2017   PORT-A-CATH REMOVAL N/A 04/16/2014   Procedure: REMOVAL PORT-A-CATH;  Surgeon: Stark Klein, MD;  Location: [facility];  Service: General;  Laterality: N/A;   PORTACATH PLACEMENT Right 02/04/2013   Procedure: INSERTION PORT-A-CATH;  Surgeon: Stark Klein, MD;  Location: [facility];  Service: General;  Laterality: Right;  Start Time: 2482.   SHOULDER ARTHROSCOPY WITH ROTATOR CUFF REPAIR AND SUBACROMIAL DECOMPRESSION Left 02/24/2014   Procedure: SHOULDER ARTHROSCOPY WITH ROTATOR CUFF REPAIR AND SUBACROMIAL DECOMPRESSION;  Surgeon: Meredith Pel, MD;  Location: [facility];  Service: Orthopedics;  Laterality: Left;  LEFT SHOULDER DIAGNOSTIC OPERATIVE ARTHROSCOPY, SUBACROMIAL DECOMPRESSION, ROTATOR CUFF TEAR REPAIR   SIMPLE MASTECTOMY WITH AXILLARY SENTINEL NODE BIOPSY Left 02/15/2017   Procedure: LEFT MASTECTOMY;  Surgeon: Stark Klein, MD;  Location: [facility];  Service: General;  Laterality: Left;   TOTAL MASTECTOMY Right 12/26/2018   Procedure: RIGHT BREAST PROPHYLATIC MASTECTOMY;  Surgeon: Stark Klein, MD;  Location: [facility];  Service: General;  Laterality: Right;   Past Surgical History:  Procedure Laterality Date   ANAL SPHINCTEROTOMY  04/2011   APPENDECTOMY  1980   AXILLARY LYMPH NODE DISSECTION Left 02/04/2013   Procedure: LEFT AXILLARY LYMPH NODE DISSECTION;  Surgeon: Stark Klein, MD;   Location: [facility];  Service: General;  Laterality: Left;  End: 5003   BREAST LUMPECTOMY WITH NEEDLE LOCALIZATION Left 02/04/2013   Procedure: LEFT BREAST LUMPECTOMY WITH NEEDLE LOCALIZATION;  Surgeon: Stark Klein, MD;  Location: [facility];  Service: General;  Laterality: Left;   BREAST SURGERY     Lumpectomy in april 2014   [place]  04/2011   ligation   MASTECTOMY Left 02/15/2017  PORT-A-CATH REMOVAL N/A 04/16/2014   Procedure: REMOVAL PORT-A-CATH;  Surgeon: Stark Klein, MD;  Location: [facility];  Service: General;  Laterality: N/A;   PORTACATH PLACEMENT Right 02/04/2013   Procedure: INSERTION PORT-A-CATH;  Surgeon: Stark Klein, MD;  Location: [facility];  Service: General;  Laterality: Right;  Start Time: 6213.   SHOULDER ARTHROSCOPY WITH ROTATOR CUFF REPAIR AND SUBACROMIAL DECOMPRESSION Left 02/24/2014   Procedure: SHOULDER ARTHROSCOPY WITH ROTATOR CUFF REPAIR AND SUBACROMIAL DECOMPRESSION;  Surgeon: Meredith Pel, MD;  Location: [facility];  Service: Orthopedics;  Laterality: Left;  LEFT SHOULDER DIAGNOSTIC OPERATIVE ARTHROSCOPY, SUBACROMIAL DECOMPRESSION, ROTATOR CUFF TEAR REPAIR   SIMPLE MASTECTOMY WITH AXILLARY SENTINEL NODE BIOPSY Left 02/15/2017   Procedure: LEFT MASTECTOMY;  Surgeon: Stark Klein, MD;  Location: [facility];  Service: General;  Laterality: Left;   TOTAL MASTECTOMY Right 12/26/2018   Procedure: RIGHT BREAST PROPHYLATIC MASTECTOMY;  Surgeon: Stark Klein, MD;  Location: [facility];  Service: General;  Laterality: Right;   Past Medical History:  Diagnosis Date   Abnormal gait 12/17/2019   Acute blood loss anemia    Anal fissure 05/03/2011   Anemia    Iron deficinecy anemia   Anemia 05/14/2017   Anxiety and depression 05/15/2014   Arthritis    Body mass index (BMI) 30.0-30.9, adult 12/17/2019   Breast cancer ([place])    left ,last radiation 2'15, last chemo 8'14   Cervical cancer screening 06/18/2018   Menarche at 11 Regular and moderate flow  history of abnormal pap in past, 1 mild  abnormality years ago that resolved spontaneously on recheck G1P1, s/p 1 svd history of abnormal MGM, b/o breast cancer 2014 No concerns today  gyn surgeries. Lumpectomy  LMP around early 2014   Cervical stenosis of spine 12/17/2019   Constipation 11/13/2016   Contracture of axilla 05/20/2013   Debility 01/18/2020   Essential (primary) hypertension 12/17/2019   [place] 02/20/2014   Gait instability 12/17/2019   Genetic testing 02/20/2019   Negative genetic testing on the Comprehensive Cancer Panel.  The Comprehensive Common Cancer Panel offered by [facility] includes sequencing and/or deletion duplication testing of the following 46 genes: APC, ATM, AXIN2, BAP1, BARD1, BMPR1A, BRCA1, BRCA2, BRIP1, CDH1, CDK4, CDKN2A, CHEK2, EPCAM, FANCC, FH, FLCN, HOXB13, MET, MITF,  MLH1, MSH2, MSH6, MUTYH, NBN, NF1, NTHL1,  PALB2, PMS2, POLD1, POLE, P   H/O: CVA (cerebrovascular accident) 06/19/2020   Hereditary and idiopathic peripheral neuropathy 11/11/2013   Herpes zoster 05/15/2014   History of chicken pox    History of left breast cancer 02/15/2017   History of radiation therapy 09/09/13-10/28/13   45 gray to left breast, lumpectomy cavity boosted to 63 gray   HTN (hypertension) 11/13/2016   Hyperglycemia 01/09/2016   Hyperlipidemia 05/14/2017   Hypertension    Internal hemorrhoids with complication 08/65/7846   Knee pain, bilateral 11/14/2016   Left upper extremity numbness 06/19/2018   Major depressive disorder, recurrent episode, severe ([place]) 01/12/2014   Malignant neoplasm of upper-inner quadrant of female breast ([place]) 01/24/2013    Left IDC, ER 3%, PR-, Her2neu-  Formatting of this note might be different from the original. Overview:   Left IDC, ER 3%, PR-, Her2neu-  Last Assessment & Plan:  Pt will continue to take her tamoxifen as previously directed. She appears to be tolerating this with only c/o moderate, occ hot flashes.   Mild neurocognitive disorder due to multiple etiologies 03/17/2020    MRSA (methicillin resistant Staphylococcus aureus) 2009   right groin area-no issues now. 04-07-14 PCR  screen negative today.   Neuropathy    Neuropathy of hand, left 09/21/2015   Nodule of finger of left hand 11/14/2016   Palpitations 07/30/2020   Preventative health care 11/13/2016   Right hip pain 11/14/2016   S/P mastectomy, right 12/26/2018   Sebaceous cyst 04/12/2013   Shoulder joint pain 12/12/2013   Spinal stenosis in cervical region 10/09/2019   Strain of rotator cuff 02/24/2014   Stroke ([place]) 06/2020   Vertigo    LMP 01/20/2013   Opioid Risk Score:   Fall Risk Score:  `1  Depression screen PHQ 2/9  Depression screen [facility] 2/9 06/16/2021 06/01/2021 04/21/2021 03/08/2021 11/10/2020 06/30/2020 05/26/2020  Decreased Interest 0 0 0 _0 0  Down, Depressed, Hopeless 0 0 0 _1 0  PHQ - 2 Score 0 0 0 _2 0  Altered sleeping - - - 0 - - -  Tired, decreased energy - - - 1 - - -  Change in appetite - - - 0 - - -  Feeling bad or failure about yourself  - - - 0 - - -  Trouble concentrating - - - 0 - - -  Moving slowly or fidgety/restless - - - 0 - - -  Suicidal thoughts - - - 0 - - -  PHQ-9 Score - - - 3 - - -  Some recent data might be hidden     Review of Systems  Constitutional: Negative.   HENT: Negative.    Eyes: Negative.   Respiratory: Negative.    Cardiovascular: Negative.   Gastrointestinal: Negative.   Endocrine: Negative.   Genitourinary: Negative.   Musculoskeletal:  Positive for arthralgias and myalgias.  Skin: Negative.   Allergic/Immunologic: Negative.   Neurological: Negative.   Hematological: Negative.   Psychiatric/Behavioral: Negative.    All other systems reviewed and are negative.     Objective:   Physical Exam Vitals and nursing note reviewed.  Constitutional:      Appearance: Normal appearance.  Cardiovascular:     Rate and Rhythm: Normal rate and regular rhythm.     Pulses: Normal pulses.     Heart sounds: Normal heart sounds.  Pulmonary:      Effort: Pulmonary effort is normal.     Breath sounds: Normal breath sounds.  Musculoskeletal:     Cervical back: Normal range of motion and neck supple.     Comments: Normal Muscle Bulk and Muscle Testing Reveals:  Upper Extremities: Full ROM and Muscle Strength 5/5 Lower Extremities: Full ROM and Muscle Strength 5/5 Arises From Table with ease Narrow Based  Gait     Skin:    General: Skin is warm and dry.  Neurological:     Mental Status: She is alert and oriented to person, place, and time.  Psychiatric:        Mood and Affect: Mood normal.        Behavior: Behavior normal.         Assessment & Plan:  1. Chemotherapy Induced Peripheral Neuropathy: Continue current medication regimen with Gabapentin. Continue to Monitor. 06/16/2021 2. Chronic Pain Syndrome: Refilled Hydrocodone 10/325 mg one tablet three times a day as needed. #90. 06/16/2021 We will continue the opioid monitoring program, this consists of regular clinic visits, examinations, urine drug screen, pill counts as well as use of [place] Controlled Substance Reporting system. A 12 month History has been reviewed on the [place] Controlled Substance Reporting System on 06/16/2021 3.Tenosynovitis of bilateral wrist:  Schedule for Lidocaine or Cortisone Injection with Dr Ranell Patrick 4. Polyarthralgia: Continue HEP as Tolerated: Continue to Monitor.   F/U in 1 month

## 2021-07-14 ENCOUNTER — Encounter
Payer: Medicare Other | Attending: Physical Medicine and Rehabilitation | Admitting: Physical Medicine and Rehabilitation

## 2021-07-14 ENCOUNTER — Other Ambulatory Visit: Payer: Self-pay

## 2021-07-14 VITALS — BP 107/68 | HR 63 | Temp 98.6°F | Ht 62.0 in | Wt 158.0 lb

## 2021-07-14 DIAGNOSIS — I10 Essential (primary) hypertension: Secondary | ICD-10-CM | POA: Insufficient documentation

## 2021-07-14 DIAGNOSIS — G620 Drug-induced polyneuropathy: Secondary | ICD-10-CM | POA: Insufficient documentation

## 2021-07-14 DIAGNOSIS — T451X5A Adverse effect of antineoplastic and immunosuppressive drugs, initial encounter: Secondary | ICD-10-CM | POA: Insufficient documentation

## 2021-07-14 MED ORDER — GABAPENTIN 300 MG PO CAPS: 900.0000 mg | ORAL_CAPSULE | Freq: Every day | ORAL | 3 refills | Status: DC

## 2021-07-14 MED ORDER — GABAPENTIN 600 MG PO TABS: 600.0000 mg | ORAL_TABLET | Freq: Two times a day (BID) | ORAL | 3 refills | Status: DC

## 2021-07-14 MED ORDER — HYDROCODONE-ACETAMINOPHEN 10-325 MG PO TABS: 1.0000 | ORAL_TABLET | Freq: Three times a day (TID) | ORAL | 0 refills | Status: DC | PRN

## 2021-07-14 NOTE — Progress Notes (Signed)
Subjective:    Patient ID: Kristi [name], female    DOB: [birth year], 65 y.o.   MRN: [id number]  HPI Mrs. Kristi [name] is a 65 year old female who presents for f/u of cerebral artery stroke and chemotherapy-induced peripheral neuropathy.  1) History of left breast cancer:  -She has been taking a pill of turmeric daily.  -She does use sugar at times- educated regarding benefits of ketogenic diet to keep cancer in remission. She has been trying to eat less sugar.   2) Chemotherapy induced peripheral neuropathy:  -Her pain has been stable.  -She continues to take Norco up to three times per day.  -If she skips her middle dose she does note increased pain.  -She continues to take Gabapentin 600/600/900. Can replace 600 with 800 on some days. Pain has been getting worse -Pain is constant -Worse with walking.  -pain has worsened.  -Discussed Qutenza as an option for neuropathic pain control. Discussed that this is a capsaicin patch, stronger than capsaicin cream. Discussed that it is currently approved for diabetic peripheral neuropathy and post-herpetic neuralgia, but that it has also shown benefit in treating other forms of neuropathy. Provided patient with link to site to learn more about the patch: [employer].fr. Discussed that the patch would be placed in office and benefits usually last 3 months. Discussed that unintended exposure to capsaicin can cause severe irritation of eyes, mucous membranes, respiratory tract, and skin, but that Qutenza is a local treatment and does not have the systemic side effects of other nerve medications. Discussed that there may be pain, itching, erythema, and decreased sensory function associated with the application of Qutenza. Side effects usually subside within 1 week. A cold pack of analgesic medications can help with these side effects. Blood pressure can also be increased due to pain associated with administration of the patch. She would like to  try  3) General health:  -She has been trying to do some resistance training every day.  -Since last visit she has increased her daily walk to 40 minutes despite the cold! She does 6-7,000 steps. She usually does this around 9-10am. She enjoys walking more than other types of exercise.  -she has been doing some resistance training but not as frequet  4) HTN:  -Her BP continues to be much better controlled. -89/71, 117/71 at home. She usually checks it in the morning.   -Soft in office today and it is usually this low on her home reads.  -She asks if she should be taking her Avapro in the morning. The highest she goes systolic is 121.  -discussed that she can lower dose if consistently well controlled  5) Vitiligo:  -She shows me a decrease in pigmentation in her left axilla, chest, abdomen, and back of her neck and asks what this may be from. -Stable  6) History of stroke: -She has followed up with neurology and cardiology and a Holter monitor has been placed. She has not been advised of any abnormal rhythms thus far.   7)   Pain Inventory Average Pain 6 Pain Right Now 6 My pain is intermittent, burning, dull, tingling and aching  In the last 24 hours, has pain interfered with the following? General activity 5 Relation with others 4 Enjoyment of life 5 What TIME of day is your pain at its worst? morning , daytime and evening Sleep (in general) Fair  Pain is worse with: walking, sitting and standing Pain improves with: medication Relief from Meds: 5  Family History  Problem Relation Age of Onset   Lung cancer Father    Hypertension Father    Thyroid cancer Father        dx in his 40s   Cancer Father        lung, thyroid, smoker   Breast cancer Paternal Aunt 53   Colon cancer Paternal Aunt        dx in her 50x   Cervical cancer Paternal Aunt        dzx in her 31s   Ovarian cancer Cousin        dx in her lage 53s   Breast cancer Cousin        maternal first cousin,  once removed; dx in her late 68s   Breast cancer Cousin        maternal first cousin once removed; dx in late 82s   Hypertension Mother    Diabetes Mother    Dementia Mother    Memory loss Mother    Hypertension Brother    Seizures Brother        Alcohol induced.   Alcohol abuse Brother        drinker, smoker   COPD Brother    Cancer Paternal Uncle        oral cancer   Kidney cancer Paternal Grandmother    Arthritis Daughter        back surgery   Cancer Cousin        several paternal cousins with brain cancer, leukemia, and other cancers   Cancer Sister        stomach   Social History   Socioeconomic History   Marital status: Married    Spouse name: [name]   Number of children: 1   Years of education: 12   Highest education level: Not on file  Occupational History   Occupation: INVENTORY Hotel manager: [employer]   Occupation: disability no longer working 2015  Tobacco Use   Smoking status: Never   Smokeless tobacco: Never  Vaping Use   Vaping Use: Never used  Substance and Sexual Activity   Alcohol use: No   Drug use: No   Sexual activity: Yes    Comment: lives with husband, disability/retirement. [employer], no dietary restrictions  Other Topics Concern   Not on file  Social History Narrative   Patient is married [name]) and lives at home with her husband.   Patient has one daughter   Patient drinks very little caffeine.   Left handed   Social Determinants of Health   Financial Resource Strain: Low Risk    Difficulty of Paying Living Expenses: Not hard at all  Food Insecurity: No Food Insecurity   Worried About Charity fundraiser in the Last Year: Never true   Ran Out of Food in the Last Year: Never true  Transportation Needs: No Transportation Needs   Lack of Transportation (Medical): No   Lack of Transportation (Non-Medical): No  Physical Activity: Sufficiently Active   Days of Exercise per Week: 7 days   Minutes of  Exercise per Session: 60 min  Stress: No Stress Concern Present   Feeling of Stress : Not at all  Social Connections: Moderately Integrated   Frequency of Communication with Friends and Family: More than three times a week   Frequency of Social Gatherings with Friends and Family: More than three times a week   Attends Religious Services: More than 4 times  per year   Active Member of Clubs or Organizations: No   Attends Archivist Meetings: Never   Marital Status: Married   Past Surgical History:  Procedure Laterality Date   ANAL SPHINCTEROTOMY  04/2011   APPENDECTOMY  1980   AXILLARY LYMPH NODE DISSECTION Left 02/04/2013   Procedure: LEFT AXILLARY LYMPH NODE DISSECTION;  Surgeon: Stark Klein, MD;  Location: [place];  Service: General;  Laterality: Left;  End: 2423   BREAST LUMPECTOMY WITH NEEDLE LOCALIZATION Left 02/04/2013   Procedure: LEFT BREAST LUMPECTOMY WITH NEEDLE LOCALIZATION;  Surgeon: Stark Klein, MD;  Location: [place];  Service: General;  Laterality: Left;   BREAST SURGERY     Lumpectomy in april 2014   [place]  04/2011   ligation   MASTECTOMY Left 02/15/2017   PORT-A-CATH REMOVAL N/A 04/16/2014   Procedure: REMOVAL PORT-A-CATH;  Surgeon: Stark Klein, MD;  Location: [facility];  Service: General;  Laterality: N/A;   PORTACATH PLACEMENT Right 02/04/2013   Procedure: INSERTION PORT-A-CATH;  Surgeon: Stark Klein, MD;  Location: [place];  Service: General;  Laterality: Right;  Start Time: 5361.   SHOULDER ARTHROSCOPY WITH ROTATOR CUFF REPAIR AND SUBACROMIAL DECOMPRESSION Left 02/24/2014   Procedure: SHOULDER ARTHROSCOPY WITH ROTATOR CUFF REPAIR AND SUBACROMIAL DECOMPRESSION;  Surgeon: Meredith Pel, MD;  Location: [place];  Service: Orthopedics;  Laterality: Left;  LEFT SHOULDER DIAGNOSTIC OPERATIVE ARTHROSCOPY, SUBACROMIAL DECOMPRESSION, ROTATOR CUFF TEAR REPAIR   SIMPLE MASTECTOMY WITH AXILLARY SENTINEL NODE BIOPSY Left 02/15/2017   Procedure: LEFT MASTECTOMY;   Surgeon: Stark Klein, MD;  Location: [place];  Service: General;  Laterality: Left;   TOTAL MASTECTOMY Right 12/26/2018   Procedure: RIGHT BREAST PROPHYLATIC MASTECTOMY;  Surgeon: Stark Klein, MD;  Location: [place];  Service: General;  Laterality: Right;   Past Surgical History:  Procedure Laterality Date   ANAL SPHINCTEROTOMY  04/2011   APPENDECTOMY  1980   AXILLARY LYMPH NODE DISSECTION Left 02/04/2013   Procedure: LEFT AXILLARY LYMPH NODE DISSECTION;  Surgeon: Stark Klein, MD;  Location: [place];  Service: General;  Laterality: Left;  End: 4431   BREAST LUMPECTOMY WITH NEEDLE LOCALIZATION Left 02/04/2013   Procedure: LEFT BREAST LUMPECTOMY WITH NEEDLE LOCALIZATION;  Surgeon: Stark Klein, MD;  Location: [place];  Service: General;  Laterality: Left;   BREAST SURGERY     Lumpectomy in april 2014   [place]  04/2011   ligation   MASTECTOMY Left 02/15/2017   PORT-A-CATH REMOVAL N/A 04/16/2014   Procedure: REMOVAL PORT-A-CATH;  Surgeon: Stark Klein, MD;  Location[facility];  Service: General;  Laterality: N/A;   PORTACATH PLACEMENT Right 02/04/2013   Procedure: INSERTION PORT-A-CATH;  Surgeon: Stark Klein, MD;  Location: [place];  Service: General;  Laterality: Right;  Start Time: 5400.   SHOULDER ARTHROSCOPY WITH ROTATOR CUFF REPAIR AND SUBACROMIAL DECOMPRESSION Left 02/24/2014   Procedure: SHOULDER ARTHROSCOPY WITH ROTATOR CUFF REPAIR AND SUBACROMIAL DECOMPRESSION;  Surgeon: Meredith Pel, MD;  Location: [place];  Service: Orthopedics;  Laterality: Left;  LEFT SHOULDER DIAGNOSTIC OPERATIVE ARTHROSCOPY, SUBACROMIAL DECOMPRESSION, ROTATOR CUFF TEAR REPAIR   SIMPLE MASTECTOMY WITH AXILLARY SENTINEL NODE BIOPSY Left 02/15/2017   Procedure: LEFT MASTECTOMY;  Surgeon: Stark Klein, MD;  Location: [place];  Service: General;  Laterality: Left;   TOTAL MASTECTOMY Right 12/26/2018   Procedure: RIGHT BREAST PROPHYLATIC MASTECTOMY;  Surgeon: Stark Klein, MD;  Location: [place];  Service: General;   Laterality: Right;   Past Medical History:  Diagnosis Date   Abnormal gait  12/17/2019   Acute blood loss anemia    Anal fissure 05/03/2011   Anemia    Iron deficinecy anemia   Anemia 05/14/2017   Anxiety and depression 05/15/2014   Arthritis    Body mass index (BMI) 30.0-30.9, adult 12/17/2019   Breast cancer ([place])    left ,last radiation 2'15, last chemo 8'14   Cervical cancer screening 06/18/2018   Menarche at 11 Regular and moderate flow  history of abnormal pap in past, 1 mild abnormality years ago that resolved spontaneously on recheck G1P1, s/p 1 svd history of abnormal MGM, b/o breast cancer 2014 No concerns today  gyn surgeries. Lumpectomy  LMP around early 2014   Cervical stenosis of spine 12/17/2019   Constipation 11/13/2016   Contracture of axilla 05/20/2013   Debility 01/18/2020   Essential (primary) hypertension 12/17/2019   Flushing 02/20/2014   Gait instability 12/17/2019   Genetic testing 02/20/2019   Negative genetic testing on the Comprehensive Cancer Panel.  The Comprehensive Common Cancer Panel offered by [facility] includes sequencing and/or deletion duplication testing of the following 46 genes: APC, ATM, AXIN2, BAP1, BARD1, BMPR1A, BRCA1, BRCA2, BRIP1, CDH1, CDK4, CDKN2A, CHEK2, EPCAM, FANCC, FH, FLCN, HOXB13, MET, MITF,  MLH1, MSH2, MSH6, MUTYH, NBN, NF1, NTHL1,  PALB2, PMS2, POLD1, POLE, P   H/O: CVA (cerebrovascular accident) 06/19/2020   Hereditary and idiopathic peripheral neuropathy 11/11/2013   Herpes zoster 05/15/2014   History of chicken pox    History of left breast cancer 02/15/2017   History of radiation therapy 09/09/13-10/28/13   45 gray to left breast, lumpectomy cavity boosted to 63 gray   HTN (hypertension) 11/13/2016   Hyperglycemia 01/09/2016   Hyperlipidemia 05/14/2017   Hypertension    Internal hemorrhoids with complication 18/29/9371   Knee pain, bilateral 11/14/2016   Left upper extremity numbness 06/19/2018   Major depressive disorder,  recurrent episode, severe ([place]) 01/12/2014   Malignant neoplasm of upper-inner quadrant of female breast ([place]) 01/24/2013    Left IDC, ER 3%, PR-, Her2neu-  Formatting of this note might be different from the original. Overview:   Left IDC, ER 3%, PR-, Her2neu-  Last Assessment & Plan:  Pt will continue to take her tamoxifen as previously directed. She appears to be tolerating this with only c/o moderate, occ hot flashes.   Mild neurocognitive disorder due to multiple etiologies 03/17/2020   MRSA (methicillin resistant Staphylococcus aureus) 2009   right groin area-no issues now. 04-07-14 PCR screen negative today.   Neuropathy    Neuropathy of hand, left 09/21/2015   Nodule of finger of left hand 11/14/2016   Palpitations 07/30/2020   Preventative health care 11/13/2016   Right hip pain 11/14/2016   S/P mastectomy, right 12/26/2018   Sebaceous cyst 04/12/2013   Shoulder joint pain 12/12/2013   Spinal stenosis in cervical region 10/09/2019   Strain of rotator cuff 02/24/2014   Stroke ([place]) 06/2020   Vertigo    BP 107/68   Pulse 63   Temp 98.6 F (37 C) (Oral)   Ht 5' 2"  (1.575 m)   Wt 158 lb (71.7 kg)   LMP 01/20/2013   SpO2 98%   BMI 28.90 kg/m   Opioid Risk Score:   Fall Risk Score:  `1  Depression screen PHQ 2/9  Depression screen [facility] 2/9 06/16/2021 06/01/2021 04/21/2021 03/08/2021 11/10/2020 06/30/2020 05/26/2020  Decreased Interest 0 0 0 1 1 1  0  Down, Depressed, Hopeless 0 0 0 1 1 1  0  PHQ - 2 Score 0  0 0 2 2 2  0  Altered sleeping - - - 0 - - -  Tired, decreased energy - - - 1 - - -  Change in appetite - - - 0 - - -  Feeling bad or failure about yourself  - - - 0 - - -  Trouble concentrating - - - 0 - - -  Moving slowly or fidgety/restless - - - 0 - - -  Suicidal thoughts - - - 0 - - -  PHQ-9 Score - - - 3 - - -  Some recent data might be hidden   Review of Systems  Constitutional: Negative.   HENT: Negative.    Eyes: Negative.   Respiratory: Negative.     Cardiovascular: Negative.   Gastrointestinal: Negative.   Endocrine: Negative.   Genitourinary: Negative.   Musculoskeletal:        Hand and feet pain  Skin: Negative.   Allergic/Immunologic: Negative.   Neurological: Negative.   Hematological: Negative.   Psychiatric/Behavioral: Negative.    All other systems reviewed and are negative.     Objective:   Physical Exam Gen: no distress, normal appearing HEENT: oral mucosa pink and moist, NCAT Cardio: Reg rate Chest: normal effort, normal rate of breathing Abd: soft, non-distended Ext: no edema Psych: pleasant, normal affect Skin: intact Neuro: Alert and oriented x3. Able to walk in and out of the office well. Normal gait Musculoskeletal: 5/5 strength in bilateral lower extremities and and upper extremities. Sensation intact throughout.   Assessment & Plan:  Mrs. Doria is a 65 year old woman who presents for f/u of bilateral foot painful chemo-therapy induced peripheral neuropathy since starting Anastrazole chemotherapy for breast cancer in 2015. She was also admitted to CIR for left cerebellar artery stroke. She also reports loss of skin pigmentation on trunk.   1) Chemotherapy induced peripheral neuropathy:  -Refilled Gabapentin 600/600/850m.  -Pain contract signed previously.   -UDS obtained previously. Personally reviewed and contains expected metabolites.  -PDMP reviewed. Refilled Norco 133mup to 3 times daily. MME 30. Discussed Nucynta as an alternative option and provided her with a copay card for her to discuss with her pharmacy/insurance company regarding cost.   -Provided [facility] for lumbar sympathetic nerve block.   -Discussed Qutenza as an option for neuropathic pain control. Discussed that this is a capsaicin patch, stronger than capsaicin cream. Discussed that it is currently approved for diabetic peripheral neuropathy and post-herpetic neuralgia, but that it has  also shown benefit in treating other forms of neuropathy. Provided patient with link to site to learn more about the patch: htCinemaBonus.frDiscussed that the patch would be placed in office and benefits usually last 3 months. Discussed that unintended exposure to capsaicin can cause severe irritation of eyes, mucous membranes, respiratory tract, and skin, but that Qutenza is a local treatment and does not have the systemic side effects of other nerve medications. Discussed that there may be pain, itching, erythema, and decreased sensory function associated with the application of Qutenza. Side effects usually subside within 1 week. A cold pack of analgesic medications can help with these side effects. Blood pressure can also be increased due to pain associated with administration of the patch -provided with a pain relief journal  2) Hypertension/Hypotensionn: BP is much better controlled today. Advised that she log pressures at home and bring to our follow-up appointment. Continue only with half-dose of Valsartan-HCTZ daily. Normalizing BP has helped with dizziness. Explained  mechanism for orthostatic hypotension. Discussed that we can go down on BP medication further but she prefers to maintain at this time.  -BP is 89/57 today. Discussed lowering medication but she prefers not to at this time since BP has been higher at home -Advised checking BP daily at home and logging results to bring into follow-up appointment with her PCP and myself. -Reviewed BP meds today. Advised that she can try skipping Avapro and check her BP more frequently that day as she may not require medication. Discussed side effects of hypotension such as dizziness and fatigue.  -Advised regarding healthy foods that can help lower blood pressure and provided with a list:  HTN: -BP is 107/68today.  -Advised checking BP daily at home and logging results to bring into follow-up appointment with her PCP and myself. -Reviewed BP  meds today.  -Advised regarding healthy foods that can help lower blood pressure and provided with a list: 1) citrus foods- high in vitamins and minerals 2) salmon and other fatty fish - reduces inflammation and oxylipins 3) swiss chard (leafy green)- high level of nitrates 4) pumpkin seeds- one of the best natural sources of magnesium 5) Beans and lentils- high in fiber, magnesium, and potassium 6) Berries- high in flavonoids 7) Amaranth (whole grain, can be cooked similarly to rice and oats)- high in magnesium and fiber 8) Pistachios- even more effective at reducing BP than other nuts 9) Carrots- high in phenolic compounds that relax blood vessels and reduce inflammation 10) Celery- contain phthalides that relax tissues of arterial walls 11) Tomatoes- can also improve cholesterol and reduce risk of heart disease 12) Broccoli- good source of magnesium, calcium, and potassium 13) Greek yogurt: high in potassium and calcium 14) Herbs and spices: Celery seed, cilantro, saffron, lemongrass, black cumin, ginseng, cinnamon, cardamom, sweet basil, and ginger 15) Chia and flax seeds- also help to lower cholesterol and blood sugar 16) Beets- high levels of nitrates that relax blood vessels  17) spinach and bananas- high in potassium  -Provided lise of supplements that can help with hypertension:  1) magnesium: one high quality brand is Bioptemizers since it contains all 7 types of magnesium, otherwise over the counter magnesium gluconate 476m is a good option 2) B vitamins 3) vitamin D 4) potassium 5) CoQ10 6) L-arginine 7) Vitamin C 8) Beetroot -Educated that goal BP is 120/80. -Made goal to incorporate some of the above foods into diet.    3) General health: Educated regarding low carb/sugar diet and discussed her current diet. Advise regarding anti-inflammatory foods. Commended 45 minute walk daily! Advised resistive exercises daily.   4) History of breast cancer: continue ketogenic  diet. Continue daily turmeric. Prescribed lymphedema therapy.   5) Vitiligo: Given location where she had radiation, it may be a result of destruction of the pigment producing cells -It is not bothersome to her -Advised to mention to oncologist -Advised that there is no treatment for vitiligo.  -It has been stbale   All questions answered. RTC in 4 weeks with EZella Ballx3, then 4 months with me

## 2021-07-18 ENCOUNTER — Encounter: Payer: Medicare Other | Admitting: Physical Therapy

## 2021-07-20 ENCOUNTER — Encounter: Payer: Medicare Other | Admitting: Physical Therapy

## 2021-07-25 ENCOUNTER — Encounter: Payer: Medicare Other | Admitting: Physical Therapy

## 2021-07-27 ENCOUNTER — Encounter: Payer: Medicare Other | Admitting: Physical Therapy

## 2021-08-01 ENCOUNTER — Encounter: Payer: Medicare Other | Admitting: Physical Therapy

## 2021-08-03 ENCOUNTER — Encounter: Payer: Medicare Other | Admitting: Physical Therapy

## 2021-08-05 ENCOUNTER — Encounter: Payer: Medicare Other | Admitting: Rehabilitation

## 2021-08-15 ENCOUNTER — Other Ambulatory Visit: Payer: Self-pay

## 2021-08-15 ENCOUNTER — Encounter: Payer: Medicare Other | Attending: Physical Medicine and Rehabilitation | Admitting: Registered Nurse

## 2021-08-15 ENCOUNTER — Encounter: Payer: Self-pay | Admitting: Registered Nurse

## 2021-08-15 VITALS — BP 120/77 | HR 73 | Temp 98.4°F | Ht 62.0 in | Wt 159.2 lb

## 2021-08-15 DIAGNOSIS — M2550 Pain in unspecified joint: Secondary | ICD-10-CM | POA: Insufficient documentation

## 2021-08-15 DIAGNOSIS — G894 Chronic pain syndrome: Secondary | ICD-10-CM | POA: Insufficient documentation

## 2021-08-15 DIAGNOSIS — G620 Drug-induced polyneuropathy: Secondary | ICD-10-CM | POA: Diagnosis not present

## 2021-08-15 DIAGNOSIS — T451X5A Adverse effect of antineoplastic and immunosuppressive drugs, initial encounter: Secondary | ICD-10-CM | POA: Insufficient documentation

## 2021-08-15 DIAGNOSIS — Z79891 Long term (current) use of opiate analgesic: Secondary | ICD-10-CM | POA: Diagnosis not present

## 2021-08-15 DIAGNOSIS — Z5181 Encounter for therapeutic drug level monitoring: Secondary | ICD-10-CM | POA: Diagnosis not present

## 2021-08-15 MED ORDER — HYDROCODONE-ACETAMINOPHEN 10-325 MG PO TABS: 1.0000 | ORAL_TABLET | Freq: Three times a day (TID) | ORAL | 0 refills | Status: DC | PRN

## 2021-08-15 NOTE — Progress Notes (Signed)
Subjective:    Patient ID: Kristi [name], female    DOB: 05-Jun-1956, 65 y.o.   MRN: [id number]  HPI: LORALYE LOBERG is a 65 y.o. female who returns for follow up appointment for chronic pain and medication refill. She states her  pain is located in her bilateral feet with tingling and burning. She rates her pain 6 Her current exercise regime is walking and performing stretching exercises.   Ms. [place] Morphine equivalent is 30.00 MME.   UDS ordered today.    Pain Inventory Average Pain 6 Pain Right Now 6 My pain is intermittent, burning, dull, tingling, and aching  In the last 24 hours, has pain interfered with the following? General activity 5 Relation with others 4 Enjoyment of life 5 What TIME of day is your pain at its worst? morning , daytime, and evening Sleep (in general) Fair  Pain is worse with: walking, sitting, and standing Pain improves with: medication Relief from Meds: 5  Family History  Problem Relation Age of Onset   Lung cancer Father    Hypertension Father    Thyroid cancer Father        dx in his 32s   Cancer Father        lung, thyroid, smoker   Breast cancer Paternal Aunt 64   Colon cancer Paternal Aunt        dx in her 50x   Cervical cancer Paternal Aunt        dzx in her 60s   Ovarian cancer Cousin        dx in her lage 28s   Breast cancer Cousin        maternal first cousin, once removed; dx in her late 9s   Breast cancer Cousin        maternal first cousin once removed; dx in late 75s   Hypertension Mother    Diabetes Mother    Dementia Mother    Memory loss Mother    Hypertension Brother    Seizures Brother        Alcohol induced.   Alcohol abuse Brother        drinker, smoker   COPD Brother    Cancer Paternal Uncle        oral cancer   Kidney cancer Paternal Grandmother    Arthritis Daughter        back surgery   Cancer Cousin        several paternal cousins with brain cancer, leukemia, and other cancers   Cancer Sister         stomach   Social History   Socioeconomic History   Marital status: Married    Spouse name: [name]   Number of children: 1   Years of education: 12   Highest education level: Not on file  Occupational History   Occupation: INVENTORY Hotel manager: [employer]   Occupation: disability no longer working 2015  Tobacco Use   Smoking status: Never   Smokeless tobacco: Never  Vaping Use   Vaping Use: Never used  Substance and Sexual Activity   Alcohol use: No   Drug use: No   Sexual activity: Yes    Comment: lives with husband, disability/retirement. [employer], no dietary restrictions  Other Topics Concern   Not on file  Social History Narrative   Patient is married [name]) and lives at home with her husband.   Patient has one daughter   Patient  drinks very little caffeine.   Left handed   Social Determinants of Health   Financial Resource Strain: Low Risk    Difficulty of Paying Living Expenses: Not hard at all  Food Insecurity: No Food Insecurity   Worried About Charity fundraiser in the Last Year: Never true   Ran Out of Food in the Last Year: Never true  Transportation Needs: No Transportation Needs   Lack of Transportation (Medical): No   Lack of Transportation (Non-Medical): No  Physical Activity: Sufficiently Active   Days of Exercise per Week: 7 days   Minutes of Exercise per Session: 60 min  Stress: No Stress Concern Present   Feeling of Stress : Not at all  Social Connections: Moderately Integrated   Frequency of Communication with Friends and Family: More than three times a week   Frequency of Social Gatherings with Friends and Family: More than three times a week   Attends Religious Services: More than 4 times per year   Active Member of Clubs or Organizations: No   Attends Archivist Meetings: Never   Marital Status: Married   Past Surgical History:  Procedure Laterality Date   ANAL SPHINCTEROTOMY  04/2011    [place] Left 02/04/2013   Procedure: LEFT AXILLARY LYMPH NODE DISSECTION;  Surgeon: Stark Klein, MD;  Location: [facility];  Service: General;  Laterality: Left;  End: 1512   BREAST LUMPECTOMY WITH NEEDLE LOCALIZATION Left 02/04/2013   Procedure: LEFT BREAST LUMPECTOMY WITH NEEDLE LOCALIZATION;  Surgeon: Stark Klein, MD;  Location: [place] [facility];  Service: General;  Laterality: Left;   BREAST SURGERY     Lumpectomy in april 2014   [place]  04/2011   ligation   MASTECTOMY Left 02/15/2017   PORT-A-CATH REMOVAL N/A 04/16/2014   Procedure: REMOVAL PORT-A-CATH;  Surgeon: Stark Klein, MD;  Location: [facility];  Service: General;  Laterality: N/A;   PORTACATH PLACEMENT Right 02/04/2013   Procedure: INSERTION PORT-A-CATH;  Surgeon: Stark Klein, MD;  Location: [facility];  Service: General;  Laterality: Right;  Start Time: 1505.   SHOULDER ARTHROSCOPY WITH ROTATOR CUFF REPAIR AND SUBACROMIAL DECOMPRESSION Left 02/24/2014   Procedure: SHOULDER ARTHROSCOPY WITH ROTATOR CUFF REPAIR AND SUBACROMIAL DECOMPRESSION;  Surgeon: Meredith Pel, MD;  Location: [facility];  Service: Orthopedics;  Laterality: Left;  LEFT SHOULDER DIAGNOSTIC OPERATIVE ARTHROSCOPY, SUBACROMIAL DECOMPRESSION, ROTATOR CUFF TEAR REPAIR   SIMPLE MASTECTOMY WITH AXILLARY SENTINEL NODE BIOPSY Left 02/15/2017   Procedure: LEFT MASTECTOMY;  Surgeon: Stark Klein, MD;  Location: [facility];  Service: General;  Laterality: Left;   TOTAL MASTECTOMY Right 12/26/2018   Procedure: RIGHT BREAST PROPHYLATIC MASTECTOMY;  Surgeon: Stark Klein, MD;  Location: [facility];  Service: General;  Laterality: Right;   Past Surgical History:  Procedure Laterality Date   ANAL SPHINCTEROTOMY  04/2011   APPENDECTOMY  1980   AXILLARY LYMPH NODE DISSECTION Left 02/04/2013   Procedure: LEFT AXILLARY LYMPH NODE DISSECTION;  Surgeon: Stark Klein, MD;  Location: [facility];  Service: General;  Laterality: Left;  End: 6979   BREAST LUMPECTOMY WITH NEEDLE  LOCALIZATION Left 02/04/2013   Procedure: LEFT BREAST LUMPECTOMY WITH NEEDLE LOCALIZATION;  Surgeon: Stark Klein, MD;  Location: [facility];  Service: General;  Laterality: Left;   BREAST SURGERY     Lumpectomy in april 2014   [place]  04/2011   ligation   MASTECTOMY Left 02/15/2017   PORT-A-CATH REMOVAL N/A 04/16/2014   Procedure: REMOVAL PORT-A-CATH;  Surgeon: Stark Klein, MD;  Location[facility];  Service: General;  Laterality: N/A;   PORTACATH PLACEMENT Right 02/04/2013   Procedure: INSERTION PORT-A-CATH;  Surgeon: Stark Klein, MD;  Location: [facility];  Service: General;  Laterality: Right;  Start Time: 5009.   SHOULDER ARTHROSCOPY WITH ROTATOR CUFF REPAIR AND SUBACROMIAL DECOMPRESSION Left 02/24/2014   Procedure: SHOULDER ARTHROSCOPY WITH ROTATOR CUFF REPAIR AND SUBACROMIAL DECOMPRESSION;  Surgeon: Meredith Pel, MD;  Location: [facility];  Service: Orthopedics;  Laterality: Left;  LEFT SHOULDER DIAGNOSTIC OPERATIVE ARTHROSCOPY, SUBACROMIAL DECOMPRESSION, ROTATOR CUFF TEAR REPAIR   SIMPLE MASTECTOMY WITH AXILLARY SENTINEL NODE BIOPSY Left 02/15/2017   Procedure: LEFT MASTECTOMY;  Surgeon: Stark Klein, MD;  Location: [place];  Service: General;  Laterality: Left;   TOTAL MASTECTOMY Right 12/26/2018   Procedure: RIGHT BREAST PROPHYLATIC MASTECTOMY;  Surgeon: Stark Klein, MD;  Location: [facility] [facility];  Service: General;  Laterality: Right;   Past Medical History:  Diagnosis Date   Abnormal gait 12/17/2019   Acute blood loss anemia    Anal fissure 05/03/2011   Anemia    Iron deficinecy anemia   Anemia 05/14/2017   Anxiety and depression 05/15/2014   Arthritis    Body mass index (BMI) 30.0-30.9, adult 12/17/2019   Breast cancer ([place])    left ,last radiation 2'15, last chemo 8'14   Cervical cancer screening 06/18/2018   Menarche at 11 Regular and moderate flow  history of abnormal pap in past, 1 mild abnormality years ago that resolved spontaneously on recheck G1P1, s/p 1 svd history of abnormal  MGM, b/o breast cancer 2014 No concerns today  gyn surgeries. Lumpectomy  LMP around early 2014   Cervical stenosis of spine 12/17/2019   Constipation 11/13/2016   Contracture of axilla 05/20/2013   Debility 01/18/2020   Essential (primary) hypertension 12/17/2019   Flushing 02/20/2014   Gait instability 12/17/2019   Genetic testing 02/20/2019   Negative genetic testing on the Comprehensive Cancer Panel.  The Comprehensive Common Cancer Panel offered by [facility] includes sequencing and/or deletion duplication testing of the following 46 genes: APC, ATM, AXIN2, BAP1, BARD1, BMPR1A, BRCA1, BRCA2, BRIP1, CDH1, CDK4, CDKN2A, CHEK2, EPCAM, FANCC, FH, FLCN, HOXB13, MET, MITF,  MLH1, MSH2, MSH6, MUTYH, NBN, NF1, NTHL1,  PALB2, PMS2, POLD1, POLE, P   H/O: CVA (cerebrovascular accident) 06/19/2020   Hereditary and idiopathic peripheral neuropathy 11/11/2013   Herpes zoster 05/15/2014   History of chicken pox    History of left breast cancer 02/15/2017   History of radiation therapy 09/09/13-10/28/13   45 gray to left breast, lumpectomy cavity boosted to 63 gray   HTN (hypertension) 11/13/2016   Hyperglycemia 01/09/2016   Hyperlipidemia 05/14/2017   Hypertension    Internal hemorrhoids with complication 38/18/2993   Knee pain, bilateral 11/14/2016   Left upper extremity numbness 06/19/2018   Major depressive disorder, recurrent episode, severe ([place]) 01/12/2014   Malignant neoplasm of upper-inner quadrant of female breast ([place]) 01/24/2013    Left IDC, ER 3%, PR-, Her2neu-  Formatting of this note might be different from the original. Overview:   Left IDC, ER 3%, PR-, Her2neu-  Last Assessment & Plan:  Pt will continue to take her tamoxifen as previously directed. She appears to be tolerating this with only c/o moderate, occ hot flashes.   Mild neurocognitive disorder due to multiple etiologies 03/17/2020   MRSA (methicillin resistant Staphylococcus aureus) 2009   right groin area-no issues now. 04-07-14  PCR screen negative today.   Neuropathy    Neuropathy  of hand, left 09/21/2015   Nodule of finger of left hand 11/14/2016   Palpitations 07/30/2020   Preventative health care 11/13/2016   Right hip pain 11/14/2016   S/P mastectomy, right 12/26/2018   Sebaceous cyst 04/12/2013   Shoulder joint pain 12/12/2013   Spinal stenosis in cervical region 10/09/2019   Strain of rotator cuff 02/24/2014   Stroke ([place]) 06/2020   Vertigo    BP 120/77   Pulse 73   Temp 98.4 F (36.9 C) (Oral)   Ht _0  (1.575 m)   Wt 159 lb 3.2 oz (72.2 kg)   LMP 01/20/2013   SpO2 97%   BMI 29.12 kg/m   Opioid Risk Score:   Fall Risk Score:  `1  Depression screen PHQ 2/9  Depression screen [facility] 2/9 06/16/2021 06/01/2021 04/21/2021 03/08/2021 11/10/2020 06/30/2020 05/26/2020  Decreased Interest 0 0 0 _1 0  Down, Depressed, Hopeless 0 0 0 _2 0  PHQ - 2 Score 0 0 0 _3 0  Altered sleeping - - - 0 - - -  Tired, decreased energy - - - 1 - - -  Change in appetite - - - 0 - - -  Feeling bad or failure about yourself  - - - 0 - - -  Trouble concentrating - - - 0 - - -  Moving slowly or fidgety/restless - - - 0 - - -  Suicidal thoughts - - - 0 - - -  PHQ-9 Score - - - 3 - - -  Some recent data might be hidden    Review of Systems  Musculoskeletal:        Hand and feet pain  All other systems reviewed and are negative.     Objective:   Physical Exam Vitals and nursing note reviewed.  Constitutional:      Appearance: Normal appearance.  Cardiovascular:     Rate and Rhythm: Normal rate and regular rhythm.     Pulses: Normal pulses.     Heart sounds: Normal heart sounds.  Pulmonary:     Effort: Pulmonary effort is normal.     Breath sounds: Normal breath sounds.  Musculoskeletal:     Cervical back: Normal range of motion and neck supple.     Comments: Normal Muscle Bulk and Muscle Testing Reveals:  Upper Extremities: Full ROM and Muscle Strength  5/5  Lower Extremities: Full ROM and Muscle  Strength 5/5 Arises from Table with ease Narrow Based  Gait     Skin:    General: Skin is warm and dry.  Neurological:     Mental Status: She is alert and oriented to person, place, and time.  Psychiatric:        Mood and Affect: Mood normal.        Behavior: Behavior normal.         Assessment & Plan:  1. Chemotherapy Induced Peripheral Neuropathy: Continue current medication regimen with Gabapentin. Continue to Monitor. 08/15/2021 2. Chronic Pain Syndrome: Refilled Hydrocodone 10/325 mg one tablet three times a day as needed. #90. 08/15/2021 We will continue the opioid monitoring program, this consists of regular clinic visits, examinations, urine drug screen, pill counts as well as use of [place] Controlled Substance Reporting system. A 12 month History has been reviewed on the [place] Controlled Substance Reporting System on 08/15/2021 3. Polyarthralgia: Continue HEP as Tolerated: Continue to Monitor. 08/15/2021   F/U in 1 month

## 2021-08-19 LAB — TOXASSURE SELECT,+ANTIDEPR,UR

## 2021-08-22 ENCOUNTER — Telehealth: Payer: Self-pay | Admitting: *Deleted

## 2021-08-22 NOTE — Telephone Encounter (Signed)
Urine drug screen for this encounter is consistent for prescribed medication 

## 2021-09-08 ENCOUNTER — Other Ambulatory Visit: Payer: Self-pay

## 2021-09-08 ENCOUNTER — Telehealth (INDEPENDENT_AMBULATORY_CARE_PROVIDER_SITE_OTHER): Payer: Medicare Other | Admitting: Family Medicine

## 2021-09-08 DIAGNOSIS — I10 Essential (primary) hypertension: Secondary | ICD-10-CM | POA: Diagnosis not present

## 2021-09-08 DIAGNOSIS — M1990 Unspecified osteoarthritis, unspecified site: Secondary | ICD-10-CM

## 2021-09-08 DIAGNOSIS — E782 Mixed hyperlipidemia: Secondary | ICD-10-CM

## 2021-09-08 DIAGNOSIS — K5900 Constipation, unspecified: Secondary | ICD-10-CM

## 2021-09-08 DIAGNOSIS — R739 Hyperglycemia, unspecified: Secondary | ICD-10-CM

## 2021-09-08 NOTE — Assessment & Plan Note (Signed)
no changes to meds. Encouraged heart healthy diet such as the DASH diet and exercise as tolerated.  

## 2021-09-08 NOTE — Progress Notes (Signed)
MyChart Video Visit    Virtual Visit via Video Note   This visit type was conducted due to national recommendations for restrictions regarding the COVID-19 Pandemic (e.g. social distancing) in an effort to limit this patient's exposure and mitigate transmission in our community. This patient is at least at moderate risk for complications without adequate follow up. This format is felt to be most appropriate for this patient at this time. Physical exam was limited by quality of the video and audio technology used for the visit. S. Chism was able to get the patient set up on a video visit.  Patient location: home Patient and provider in visit Provider location: Office  I discussed the limitations of evaluation and management by telemedicine and the availability of in person appointments. The patient expressed understanding and agreed to proceed.  Visit Date: 09/09/21  Today's healthcare provider: [name], MD     Subjective:    Patient ID: Kristi [name], female    DOB: 1956/09/23, 65 y.o.   MRN: [id number]  Chief Complaint  Patient presents with   Follow-up    Issues with hands    HPI Patient is in today for video visit for follow up visit on Anemia and HTN. Patient c/o arthritis in wrists that is notable with turning motion like opening a jar. She states that arthritis is mainly in her right wrist and right 3rd finger. Denies CP/palp/SOB/HA/congestion/fevers/GI or GU c/o. Taking meds as prescribed  She is still dealing with swelling in left arm. She also experiences soreness and tightness on the left upper anterior chest wall that radiates to left arm and wrist. She reports feeling chest pressure/discomfort.   Past Medical History:  Diagnosis Date   Abnormal gait 12/17/2019   Acute blood loss anemia    Anal fissure 05/03/2011   Anemia    Iron deficinecy anemia   Anemia 05/14/2017   Anxiety and depression 05/15/2014   Arthritis    Body mass index (BMI) 30.0-30.9,  adult 12/17/2019   Breast cancer ([place])    left ,last radiation 2'15, last chemo 8'14   Cervical cancer screening 06/18/2018   Menarche at 11 Regular and moderate flow  history of abnormal pap in past, 1 mild abnormality years ago that resolved spontaneously on recheck G1P1, s/p 1 svd history of abnormal MGM, b/o breast cancer 2014 No concerns today  gyn surgeries. Lumpectomy  LMP around early 2014   Cervical stenosis of spine 12/17/2019   Constipation 11/13/2016   Contracture of axilla 05/20/2013   Debility 01/18/2020   Essential (primary) hypertension 12/17/2019   Flushing 02/20/2014   Gait instability 12/17/2019   Genetic testing 02/20/2019   Negative genetic testing on the Comprehensive Cancer Panel.  The Comprehensive Common Cancer Panel offered by [facility] includes sequencing and/or deletion duplication testing of the following 46 genes: APC, ATM, AXIN2, BAP1, BARD1, BMPR1A, BRCA1, BRCA2, BRIP1, CDH1, CDK4, CDKN2A, CHEK2, EPCAM, FANCC, FH, FLCN, HOXB13, MET, MITF,  MLH1, MSH2, MSH6, MUTYH, NBN, NF1, NTHL1,  PALB2, PMS2, POLD1, POLE, P   H/O: CVA (cerebrovascular accident) 06/19/2020   Hereditary and idiopathic peripheral neuropathy 11/11/2013   Herpes zoster 05/15/2014   History of chicken pox    History of left breast cancer 02/15/2017   History of radiation therapy 09/09/13-10/28/13   45 gray to left breast, lumpectomy cavity boosted to 63 gray   HTN (hypertension) 11/13/2016   Hyperglycemia 01/09/2016   Hyperlipidemia 05/14/2017   Hypertension    Internal hemorrhoids with complication 17/40/8144  Knee pain, bilateral 11/14/2016   Left upper extremity numbness 06/19/2018   Major depressive disorder, recurrent episode, severe ([place]) 01/12/2014   Malignant neoplasm of upper-inner quadrant of female breast ([place]) 01/24/2013    Left IDC, ER 3%, PR-, Her2neu-  Formatting of this note might be different from the original. Overview:   Left IDC, ER 3%, PR-, Her2neu-  Last Assessment & Plan:  Pt  will continue to take her tamoxifen as previously directed. She appears to be tolerating this with only c/o moderate, occ hot flashes.   Mild neurocognitive disorder due to multiple etiologies 03/17/2020   MRSA (methicillin resistant Staphylococcus aureus) 2009   right groin area-no issues now. 04-07-14 PCR screen negative today.   Neuropathy    Neuropathy of hand, left 09/21/2015   Nodule of finger of left hand 11/14/2016   Palpitations 07/30/2020   Preventative health care 11/13/2016   Right hip pain 11/14/2016   S/P mastectomy, right 12/26/2018   Sebaceous cyst 04/12/2013   Shoulder joint pain 12/12/2013   Spinal stenosis in cervical region 10/09/2019   Strain of rotator cuff 02/24/2014   Stroke ([place]) 06/2020   Vertigo     Past Surgical History:  Procedure Laterality Date   ANAL SPHINCTEROTOMY  04/2011   APPENDECTOMY  1980   AXILLARY LYMPH NODE DISSECTION Left 02/04/2013   Procedure: LEFT AXILLARY LYMPH NODE DISSECTION;  Surgeon: Stark Klein, MD;  Location: [facility];  Service: General;  Laterality: Left;  End: 0932   BREAST LUMPECTOMY WITH NEEDLE LOCALIZATION Left 02/04/2013   Procedure: LEFT BREAST LUMPECTOMY WITH NEEDLE LOCALIZATION;  Surgeon: Stark Klein, MD;  Location: [facility];  Service: General;  Laterality: Left;   BREAST SURGERY     Lumpectomy in april 2014   [place]  04/2011   ligation   MASTECTOMY Left 02/15/2017   PORT-A-CATH REMOVAL N/A 04/16/2014   Procedure: REMOVAL PORT-A-CATH;  Surgeon: Stark Klein, MD;  Location: [facility];  Service: General;  Laterality: N/A;   PORTACATH PLACEMENT Right 02/04/2013   Procedure: INSERTION PORT-A-CATH;  Surgeon: Stark Klein, MD;  Location: [facility];  Service: General;  Laterality: Right;  Start Time: 6712.   SHOULDER ARTHROSCOPY WITH ROTATOR CUFF REPAIR AND SUBACROMIAL DECOMPRESSION Left 02/24/2014   Procedure: SHOULDER ARTHROSCOPY WITH ROTATOR CUFF REPAIR AND SUBACROMIAL DECOMPRESSION;  Surgeon: Meredith Pel, MD;  Location: [facility];   Service: Orthopedics;  Laterality: Left;  LEFT SHOULDER DIAGNOSTIC OPERATIVE ARTHROSCOPY, SUBACROMIAL DECOMPRESSION, ROTATOR CUFF TEAR REPAIR   SIMPLE MASTECTOMY WITH AXILLARY SENTINEL NODE BIOPSY Left 02/15/2017   Procedure: LEFT MASTECTOMY;  Surgeon: Stark Klein, MD;  Location: [facility];  Service: General;  Laterality: Left;   TOTAL MASTECTOMY Right 12/26/2018   Procedure: RIGHT BREAST PROPHYLATIC MASTECTOMY;  Surgeon: Stark Klein, MD;  Location: [facility];  Service: General;  Laterality: Right;    Family History  Problem Relation Age of Onset   Lung cancer Father    Hypertension Father    Thyroid cancer Father        dx in his 25s   Cancer Father        lung, thyroid, smoker   Breast cancer Paternal Aunt 69   Colon cancer Paternal Aunt        dx in her 50x   Cervical cancer Paternal Aunt        dzx in her 18s   Ovarian cancer Cousin        dx in her lage 25s   Breast cancer Cousin  maternal first cousin, once removed; dx in her late 27s   Breast cancer Cousin        maternal first cousin once removed; dx in late 89s   Hypertension Mother    Diabetes Mother    Dementia Mother    Memory loss Mother    Hypertension Brother    Seizures Brother        Alcohol induced.   Alcohol abuse Brother        drinker, smoker   COPD Brother    Cancer Paternal Uncle        oral cancer   Kidney cancer Paternal Grandmother    Arthritis Daughter        back surgery   Cancer Cousin        several paternal cousins with brain cancer, leukemia, and other cancers   Cancer Sister        stomach    Social History   Socioeconomic History   Marital status: Married    Spouse name: [name]   Number of children: 1   Years of education: 12   Highest education level: Not on file  Occupational History   Occupation: INVENTORY Hotel manager: [employer]   Occupation: disability no longer working 2015  Tobacco Use   Smoking status: Never   Smokeless tobacco: Never  Vaping  Use   Vaping Use: Never used  Substance and Sexual Activity   Alcohol use: No   Drug use: No   Sexual activity: Yes    Comment: lives with husband, disability/retirement. [employer], no dietary restrictions  Other Topics Concern   Not on file  Social History Narrative   Patient is married [name]) and lives at home with her husband.   Patient has one daughter   Patient drinks very little caffeine.   Left handed   Social Determinants of Health   Financial Resource Strain: Low Risk    Difficulty of Paying Living Expenses: Not hard at all  Food Insecurity: No Food Insecurity   Worried About Charity fundraiser in the Last Year: Never true   Ran Out of Food in the Last Year: Never true  Transportation Needs: No Transportation Needs   Lack of Transportation (Medical): No   Lack of Transportation (Non-Medical): No  Physical Activity: Sufficiently Active   Days of Exercise per Week: 7 days   Minutes of Exercise per Session: 60 min  Stress: No Stress Concern Present   Feeling of Stress : Not at all  Social Connections: Moderately Integrated   Frequency of Communication with Friends and Family: More than three times a week   Frequency of Social Gatherings with Friends and Family: More than three times a week   Attends Religious Services: More than 4 times per year   Active Member of [employer] or Organizations: No   Attends Archivist Meetings: Never   Marital Status: Married  Human resources officer Violence: Not At Risk   Fear of Current or Ex-Partner: No   Emotionally Abused: No   Physically Abused: No   Sexually Abused: No    Outpatient Medications Prior to Visit  Medication Sig Dispense Refill   Apoaequorin (PREVAGEN) 10 MG CAPS Take 1 capsule by mouth daily.     aspirin EC 325 MG tablet Take 1 tablet (325 mg total) by mouth daily. 30 tablet 0   atorvastatin (LIPITOR) 40 MG tablet TAKE 1 TABLET BY MOUTH EVERY DAY 90 tablet 1  gabapentin (NEURONTIN) 300 MG capsule  Take 3 capsules (900 mg total) by mouth at bedtime. 90 capsule 3   gabapentin (NEURONTIN) 600 MG tablet Take 1 tablet (600 mg total) by mouth 2 (two) times daily. 60 tablet 3   HYDROcodone-acetaminophen (NORCO) 10-325 MG tablet Take 1 tablet by mouth 3 (three) times daily as needed. 90 tablet 0   hydrOXYzine (ATARAX/VISTARIL) 50 MG tablet Take 1 tablet (50 mg total) by mouth 2 (two) times daily. 30 tablet 0   lurasidone (LATUDA) 40 MG TABS tablet Take 40 mg by mouth daily.     Multiple Vitamin (MULTIVITAMIN WITH MINERALS) TABS tablet Take 1 tablet by mouth daily.     polyethylene glycol (MIRALAX / GLYCOLAX) 17 g packet Take 17 g by mouth daily as needed for mild constipation. 14 each 0   pyridOXINE (B-6) 50 MG tablet Take 50 mg by mouth daily.     traZODone (DESYREL) 50 MG tablet Take 1 tablet (50 mg total) by mouth at bedtime. 30 tablet 0   Turmeric (QC TUMERIC COMPLEX PO) Take 1 tablet by mouth daily.     valsartan (DIOVAN) 160 MG tablet Take 1 tablet (160 mg total) by mouth daily. 90 tablet 3   No facility-administered medications prior to visit.    No Known Allergies  Review of Systems  Constitutional:  Negative for chills, fever and malaise/fatigue.  HENT:  Negative for congestion, sinus pain and sore throat.   Eyes:  Negative for blurred vision.  Respiratory:  Negative for cough and shortness of breath.   Cardiovascular:  Negative for chest pain, palpitations and leg swelling.  Gastrointestinal:  Negative for blood in stool, diarrhea, nausea and vomiting.  Genitourinary:  Negative for flank pain and frequency.  Musculoskeletal:  Positive for joint pain. Negative for back pain.  Skin:  Negative for rash.  Neurological:  Positive for sensory change. Negative for headaches.      Objective:    Physical Exam Constitutional:      General: She is not in acute distress.    Appearance: Normal appearance. She is well-developed.  HENT:     Head: Normocephalic and atraumatic.      Right Ear: External ear normal.     Left Ear: External ear normal.  Eyes:     Conjunctiva/sclera: Conjunctivae normal.  Neck:     Thyroid: No thyromegaly.  Cardiovascular:     Rate and Rhythm: Normal rate and regular rhythm.     Heart sounds: Normal heart sounds. No murmur heard. Pulmonary:     Effort: Pulmonary effort is normal. No respiratory distress.     Breath sounds: Normal breath sounds.  Abdominal:     General: Bowel sounds are normal. There is no distension.     Palpations: Abdomen is soft. There is no mass.     Tenderness: There is no abdominal tenderness.  Musculoskeletal:        General: Normal range of motion.     Cervical back: Normal range of motion and neck supple.  Lymphadenopathy:     Cervical: No cervical adenopathy.  Skin:    General: Skin is warm and dry.  Neurological:     Mental Status: She is alert and oriented to person, place, and time.  Psychiatric:        Behavior: Behavior normal.    LMP 01/20/2013  Wt Readings from Last 3 Encounters:  08/15/21 159 lb 3.2 oz (72.2 kg)  07/14/21 158 lb (71.7 kg)  06/16/21 157 lb 6.4  oz (71.4 kg)    Diabetic Foot Exam - Simple   No data filed    Lab Results  Component Value Date   WBC 5.8 03/08/2021   HGB 11.2 (L) 03/08/2021   HCT 34.7 (L) 03/08/2021   PLT 171.0 03/08/2021   GLUCOSE 83 03/08/2021   CHOL 115 03/08/2021   TRIG 62.0 03/08/2021   HDL 51.40 03/08/2021   LDLCALC 51 03/08/2021   ALT 18 03/08/2021   AST 22 03/08/2021   NA 141 03/08/2021   K 4.4 03/08/2021   CL 102 03/08/2021   CREATININE 1.07 03/08/2021   BUN 14 03/08/2021   CO2 34 (H) 03/08/2021   TSH 1.89 03/08/2021   INR 1.0 06/15/2020   HGBA1C 5.9 (H) 06/16/2020    Lab Results  Component Value Date   TSH 1.89 03/08/2021   Lab Results  Component Value Date   WBC 5.8 03/08/2021   HGB 11.2 (L) 03/08/2021   HCT 34.7 (L) 03/08/2021   MCV 86.0 03/08/2021   PLT 171.0 03/08/2021   Lab Results  Component Value Date   NA 141  03/08/2021   K 4.4 03/08/2021   CHLORIDE 104 01/22/2017   CO2 34 (H) 03/08/2021   GLUCOSE 83 03/08/2021   BUN 14 03/08/2021   CREATININE 1.07 03/08/2021   BILITOT 0.4 03/08/2021   ALKPHOS 98 03/08/2021   AST 22 03/08/2021   ALT 18 03/08/2021   PROT 6.9 03/08/2021   ALBUMIN 4.3 03/08/2021   CALCIUM 10.5 03/08/2021   ANIONGAP 11 06/21/2020   EGFR 67 (L) 01/22/2017   GFR 54.28 (L) 03/08/2021   Lab Results  Component Value Date   CHOL 115 03/08/2021   Lab Results  Component Value Date   HDL 51.40 03/08/2021   Lab Results  Component Value Date   LDLCALC 51 03/08/2021   Lab Results  Component Value Date   TRIG 62.0 03/08/2021   Lab Results  Component Value Date   CHOLHDL 2 03/08/2021   Lab Results  Component Value Date   HGBA1C 5.9 (H) 06/16/2020       Assessment & Plan:   Problem List Items Addressed This Visit     Hyperglycemia    hgba1c acceptable, minimize simple carbs. Increase exercise as tolerated.       Relevant Orders   Hemoglobin A1c   Constipation - Primary    Encouraged increased hydration and fiber in diet. Daily probiotics. If bowels not moving can use MOM 2 tbls po in 4 oz of warm prune juice by mouth every 2-3 days. If no results then repeat in 4 hours with  Dulcolax suppository pr, may repeat again in 4 more hours as needed. Seek care if symptoms worsen. Consider daily Miralax and/or Dulcolax if symptoms persist.       Relevant Orders   CBC   HTN (hypertension)     no changes to meds. Encouraged heart healthy diet such as the DASH diet and exercise as tolerated.       Relevant Orders   CBC   Comprehensive metabolic panel   TSH   Hyperlipidemia    Encourage heart healthy diet such as MIND or DASH diet, increase exercise, avoid trans fats, simple carbohydrates and processed foods, consider a krill or fish or flaxseed oil cap daily.       Relevant Orders   Lipid panel   Arthritis    Notes pain and decreased ROM in bilateral wrists.  Has most pain and dysfunction in right 3rd  finger and some numbness in left hand. Encouraged ice and topical Lidocaine, Voltaren gel to wrists and consider referral for further care if symptoms persist.         No orders of the defined types were placed in this encounter.   I discussed the assessment and treatment plan with the patient. The patient was provided an opportunity to ask questions and all were answered. The patient agreed with the plan and demonstrated an understanding of the instructions.   The patient was advised to call back or seek an in-person evaluation if the symptoms worsen or if the condition fails to improve as anticipated.  I provided 20 minutes of face-to-face time during this encounter.   [name], MD [facility] at [facility] [phone] (phone) [phone] (fax)  [place], Suezanne Jacquet, acting as a scribe for [name], MD, have documented all relevent documentation on behalf of [name], MD, as directed by [name], MD while in the presence of [name], MD. DO:09/09/21.  I, Mosie Lukes, MD personally performed the services described in this documentation. All medical record entries made by the scribe were at my direction and in my presence. I have reviewed the chart and agree that the record reflects my personal performance and is accurate and complete

## 2021-09-08 NOTE — Assessment & Plan Note (Signed)
hgba1c acceptable, minimize simple carbs. Increase exercise as tolerated.  

## 2021-09-09 NOTE — Assessment & Plan Note (Signed)
Encourage heart healthy diet such as MIND or DASH diet, increase exercise, avoid trans fats, simple carbohydrates and processed foods, consider a krill or fish or flaxseed oil cap daily.  °

## 2021-09-09 NOTE — Assessment & Plan Note (Signed)
Notes pain and decreased ROM in bilateral wrists. Has most pain and dysfunction in right 3rd finger and some numbness in left hand. Encouraged ice and topical Lidocaine, Voltaren gel to wrists and consider referral for further care if symptoms persist.

## 2021-09-09 NOTE — Assessment & Plan Note (Signed)
Encouraged increased hydration and fiber in diet. Daily probiotics. If bowels not moving can use MOM 2 tbls po in 4 oz of warm prune juice by mouth every 2-3 days. If no results then repeat in 4 hours with  Dulcolax suppository pr, may repeat again in 4 more hours as needed. Seek care if symptoms worsen. Consider daily Miralax and/or Dulcolax if symptoms persist.  

## 2021-09-15 ENCOUNTER — Encounter: Payer: Self-pay | Admitting: Registered Nurse

## 2021-09-15 ENCOUNTER — Other Ambulatory Visit: Payer: Self-pay

## 2021-09-15 ENCOUNTER — Encounter: Payer: Medicare Other | Attending: Physical Medicine and Rehabilitation | Admitting: Registered Nurse

## 2021-09-15 VITALS — BP 119/69 | HR 60 | Temp 98.5°F | Ht 62.0 in | Wt 160.0 lb

## 2021-09-15 DIAGNOSIS — G620 Drug-induced polyneuropathy: Secondary | ICD-10-CM

## 2021-09-15 DIAGNOSIS — G894 Chronic pain syndrome: Secondary | ICD-10-CM

## 2021-09-15 DIAGNOSIS — Z5181 Encounter for therapeutic drug level monitoring: Secondary | ICD-10-CM | POA: Diagnosis not present

## 2021-09-15 DIAGNOSIS — Z79891 Long term (current) use of opiate analgesic: Secondary | ICD-10-CM

## 2021-09-15 DIAGNOSIS — T451X5A Adverse effect of antineoplastic and immunosuppressive drugs, initial encounter: Secondary | ICD-10-CM

## 2021-09-15 MED ORDER — HYDROCODONE-ACETAMINOPHEN 10-325 MG PO TABS: 1.0000 | ORAL_TABLET | Freq: Three times a day (TID) | ORAL | 0 refills | Status: DC | PRN

## 2021-09-15 NOTE — Progress Notes (Signed)
Subjective:    Patient ID: Kristi [name], female    DOB: 12-26-1955, 65 y.o.   MRN: [id number]  HPI: Kristi [name] is a 65 y.o. female who returns for follow up appointment for chronic pain and medication refill. She states her pain is located in her bilateral hands and bilateral feet.She rates her pain 7. Her  current exercise regime is walking and performing stretching exercises.  Ms. Biegler Morphine equivalent is 30.00 MME.   Last UDS was Performed on 08/15/2021, it was consistent.     Pain Inventory Average Pain 7 Pain Right Now 7 My pain is constant, burning, tingling, and aching  In the last 24 hours, has pain interfered with the following? General activity 6 Relation with others 7 Enjoyment of life 5 What TIME of day is your pain at its worst? morning , daytime, evening, and night Sleep (in general) Fair  Pain is worse with: walking, sitting, inactivity, and standing Pain improves with: rest and medication Relief from Meds: 6  Family History  Problem Relation Age of Onset   Lung cancer Father    Hypertension Father    Thyroid cancer Father        dx in his 43s   Cancer Father        lung, thyroid, smoker   Breast cancer Paternal Aunt 31   Colon cancer Paternal Aunt        dx in her 50x   Cervical cancer Paternal Aunt        dzx in her 28s   Ovarian cancer Cousin        dx in her lage 53s   Breast cancer Cousin        maternal first cousin, once removed; dx in her late 55s   Breast cancer Cousin        maternal first cousin once removed; dx in late 33s   Hypertension Mother    Diabetes Mother    Dementia Mother    Memory loss Mother    Hypertension Brother    Seizures Brother        Alcohol induced.   Alcohol abuse Brother        drinker, smoker   COPD Brother    Cancer Paternal Uncle        oral cancer   Kidney cancer Paternal Grandmother    Arthritis Daughter        back surgery   Cancer Cousin        several paternal cousins with brain cancer,  leukemia, and other cancers   Cancer Sister        stomach   Social History   Socioeconomic History   Marital status: Married    Spouse name: [name]   Number of children: 1   Years of education: 12   Highest education level: Not on file  Occupational History   Occupation: INVENTORY Hotel manager: [employer]   Occupation: disability no longer working 2015  Tobacco Use   Smoking status: Never   Smokeless tobacco: Never  Vaping Use   Vaping Use: Never used  Substance and Sexual Activity   Alcohol use: No   Drug use: No   Sexual activity: Yes    Comment: lives with husband, disability/retirement. [employer], no dietary restrictions  Other Topics Concern   Not on file  Social History Narrative   Patient is married [name]) and lives at home with her husband.  Patient has one daughter   Patient drinks very little caffeine.   Left handed   Social Determinants of Health   Financial Resource Strain: Low Risk    Difficulty of Paying Living Expenses: Not hard at all  Food Insecurity: No Food Insecurity   Worried About Charity fundraiser in the Last Year: Never true   Ran Out of Food in the Last Year: Never true  Transportation Needs: No Transportation Needs   Lack of Transportation (Medical): No   Lack of Transportation (Non-Medical): No  Physical Activity: Sufficiently Active   Days of Exercise per Week: 7 days   Minutes of Exercise per Session: 60 min  Stress: No Stress Concern Present   Feeling of Stress : Not at all  Social Connections: Moderately Integrated   Frequency of Communication with Friends and Family: More than three times a week   Frequency of Social Gatherings with Friends and Family: More than three times a week   Attends Religious Services: More than 4 times per year   Active Member of Clubs or Organizations: No   Attends Archivist Meetings: Never   Marital Status: Married   Past Surgical History:  Procedure  Laterality Date   ANAL SPHINCTEROTOMY  04/2011   [place] Left 02/04/2013   Procedure: LEFT AXILLARY LYMPH NODE DISSECTION;  Surgeon: Stark Klein, MD;  Location: [facility];  Service: General;  Laterality: Left;  End: 1512   BREAST LUMPECTOMY WITH NEEDLE LOCALIZATION Left 02/04/2013   Procedure: LEFT BREAST LUMPECTOMY WITH NEEDLE LOCALIZATION;  Surgeon: Stark Klein, MD;  Location: [facility];  Service: General;  Laterality: Left;   BREAST SURGERY     Lumpectomy in april 2014   [place]  04/2011   ligation   MASTECTOMY Left 02/15/2017   PORT-A-CATH REMOVAL N/A 04/16/2014   Procedure: REMOVAL PORT-A-CATH;  Surgeon: Stark Klein, MD;  Location: [facility];  Service: General;  Laterality: N/A;   PORTACATH PLACEMENT Right 02/04/2013   Procedure: INSERTION PORT-A-CATH;  Surgeon: Stark Klein, MD;  Location: [facility];  Service: General;  Laterality: Right;  Start Time: 1700.   SHOULDER ARTHROSCOPY WITH ROTATOR CUFF REPAIR AND SUBACROMIAL DECOMPRESSION Left 02/24/2014   Procedure: SHOULDER ARTHROSCOPY WITH ROTATOR CUFF REPAIR AND SUBACROMIAL DECOMPRESSION;  Surgeon: Meredith Pel, MD;  Location: [facility];  Service: Orthopedics;  Laterality: Left;  LEFT SHOULDER DIAGNOSTIC OPERATIVE ARTHROSCOPY, SUBACROMIAL DECOMPRESSION, ROTATOR CUFF TEAR REPAIR   SIMPLE MASTECTOMY WITH AXILLARY SENTINEL NODE BIOPSY Left 02/15/2017   Procedure: LEFT MASTECTOMY;  Surgeon: Stark Klein, MD;  Location: [facility];  Service: General;  Laterality: Left;   TOTAL MASTECTOMY Right 12/26/2018   Procedure: RIGHT BREAST PROPHYLATIC MASTECTOMY;  Surgeon: Stark Klein, MD;  Location: [facility];  Service: General;  Laterality: Right;   Past Surgical History:  Procedure Laterality Date   ANAL SPHINCTEROTOMY  04/2011   APPENDECTOMY  1980   AXILLARY LYMPH NODE DISSECTION Left 02/04/2013   Procedure: LEFT AXILLARY LYMPH NODE DISSECTION;  Surgeon: Stark Klein, MD;  Location: [place];  Service: General;  Laterality:  Left;  End: 1749   BREAST LUMPECTOMY WITH NEEDLE LOCALIZATION Left 02/04/2013   Procedure: LEFT BREAST LUMPECTOMY WITH NEEDLE LOCALIZATION;  Surgeon: Stark Klein, MD;  Location: [facility];  Service: General;  Laterality: Left;   BREAST SURGERY     Lumpectomy in april 2014   [place]  04/2011   ligation   MASTECTOMY Left 02/15/2017   PORT-A-CATH REMOVAL N/A  04/16/2014   Procedure: REMOVAL PORT-A-CATH;  Surgeon: Stark Klein, MD;  Location: [facility];  Service: General;  Laterality: N/A;   PORTACATH PLACEMENT Right 02/04/2013   Procedure: INSERTION PORT-A-CATH;  Surgeon: Stark Klein, MD;  Location: [place];  Service: General;  Laterality: Right;  Start Time: 7124.   SHOULDER ARTHROSCOPY WITH ROTATOR CUFF REPAIR AND SUBACROMIAL DECOMPRESSION Left 02/24/2014   Procedure: SHOULDER ARTHROSCOPY WITH ROTATOR CUFF REPAIR AND SUBACROMIAL DECOMPRESSION;  Surgeon: Meredith Pel, MD;  Location: [place];  Service: Orthopedics;  Laterality: Left;  LEFT SHOULDER DIAGNOSTIC OPERATIVE ARTHROSCOPY, SUBACROMIAL DECOMPRESSION, ROTATOR CUFF TEAR REPAIR   SIMPLE MASTECTOMY WITH AXILLARY SENTINEL NODE BIOPSY Left 02/15/2017   Procedure: LEFT MASTECTOMY;  Surgeon: Stark Klein, MD;  Location: [place];  Service: General;  Laterality: Left;   TOTAL MASTECTOMY Right 12/26/2018   Procedure: RIGHT BREAST PROPHYLATIC MASTECTOMY;  Surgeon: Stark Klein, MD;  Location: [place];  Service: General;  Laterality: Right;   Past Medical History:  Diagnosis Date   Abnormal gait 12/17/2019   Acute blood loss anemia    Anal fissure 05/03/2011   Anemia    Iron deficinecy anemia   Anemia 05/14/2017   Anxiety and depression 05/15/2014   Arthritis    Body mass index (BMI) 30.0-30.9, adult 12/17/2019   Breast cancer ([place])    left ,last radiation 2'15, last chemo 8'14   Cervical cancer screening 06/18/2018   Menarche at 11 Regular and moderate flow  history of abnormal pap in past, 1 mild abnormality years ago that resolved spontaneously  on recheck G1P1, s/p 1 svd history of abnormal MGM, b/o breast cancer 2014 No concerns today  gyn surgeries. Lumpectomy  LMP around early 2014   Cervical stenosis of spine 12/17/2019   Constipation 11/13/2016   Contracture of axilla 05/20/2013   Debility 01/18/2020   Essential (primary) hypertension 12/17/2019   Flushing 02/20/2014   Gait instability 12/17/2019   Genetic testing 02/20/2019   Negative genetic testing on the Comprehensive Cancer Panel.  The Comprehensive Common Cancer Panel offered by [facility] includes sequencing and/or deletion duplication testing of the following 46 genes: APC, ATM, AXIN2, BAP1, BARD1, BMPR1A, BRCA1, BRCA2, BRIP1, CDH1, CDK4, CDKN2A, CHEK2, EPCAM, FANCC, FH, FLCN, HOXB13, MET, MITF,  MLH1, MSH2, MSH6, MUTYH, NBN, NF1, NTHL1,  PALB2, PMS2, POLD1, POLE, P   H/O: CVA (cerebrovascular accident) 06/19/2020   Hereditary and idiopathic peripheral neuropathy 11/11/2013   Herpes zoster 05/15/2014   History of chicken pox    History of left breast cancer 02/15/2017   History of radiation therapy 09/09/13-10/28/13   45 gray to left breast, lumpectomy cavity boosted to 63 gray   HTN (hypertension) 11/13/2016   Hyperglycemia 01/09/2016   Hyperlipidemia 05/14/2017   Hypertension    Internal hemorrhoids with complication 58/07/9832   Knee pain, bilateral 11/14/2016   Left upper extremity numbness 06/19/2018   Major depressive disorder, recurrent episode, severe ([place]) 01/12/2014   Malignant neoplasm of upper-inner quadrant of female breast ([place]) 01/24/2013    Left IDC, ER 3%, PR-, Her2neu-  Formatting of this note might be different from the original. Overview:   Left IDC, ER 3%, PR-, Her2neu-  Last Assessment & Plan:  Pt will continue to take her tamoxifen as previously directed. She appears to be tolerating this with only c/o moderate, occ hot flashes.   Mild neurocognitive disorder due to multiple etiologies 03/17/2020   MRSA (methicillin resistant Staphylococcus aureus)  2009   right groin area-no issues now. 04-07-14 PCR screen negative today.  Neuropathy    Neuropathy of hand, left 09/21/2015   Nodule of finger of left hand 11/14/2016   Palpitations 07/30/2020   Preventative health care 11/13/2016   Right hip pain 11/14/2016   S/P mastectomy, right 12/26/2018   Sebaceous cyst 04/12/2013   Shoulder joint pain 12/12/2013   Spinal stenosis in cervical region 10/09/2019   Strain of rotator cuff 02/24/2014   Stroke ([place]) 06/2020   Vertigo    Ht 5' 2"  (1.575 m)   Wt 160 lb (72.6 kg)   LMP 01/20/2013   BMI 29.26 kg/m   Opioid Risk Score:   Fall Risk Score:  `1  Depression screen PHQ 2/9  Depression screen [facility] 2/9 09/15/2021 06/16/2021 06/01/2021 04/21/2021 03/08/2021 11/10/2020 06/30/2020  Decreased Interest 0 0 0 0 1 1 1   Down, Depressed, Hopeless 0 0 0 0 1 1 1   PHQ - 2 Score 0 0 0 0 2 2 2   Altered sleeping - - - - 0 - -  Tired, decreased energy - - - - 1 - -  Change in appetite - - - - 0 - -  Feeling bad or failure about yourself  - - - - 0 - -  Trouble concentrating - - - - 0 - -  Moving slowly or fidgety/restless - - - - 0 - -  Suicidal thoughts - - - - 0 - -  PHQ-9 Score - - - - 3 - -  Some recent data might be hidden     Review of Systems  Constitutional: Negative.   HENT: Negative.    Eyes: Negative.   Respiratory: Negative.    Cardiovascular: Negative.   Gastrointestinal: Negative.   Endocrine: Negative.   Genitourinary: Negative.   Musculoskeletal: Negative.   Skin: Negative.   Allergic/Immunologic: Negative.   Neurological: Negative.   Hematological: Negative.   Psychiatric/Behavioral: Negative.        Objective:   Physical Exam Vitals and nursing note reviewed.  Constitutional:      Appearance: Normal appearance.  Cardiovascular:     Rate and Rhythm: Normal rate and regular rhythm.     Pulses: Normal pulses.     Heart sounds: Normal heart sounds.  Pulmonary:     Effort: Pulmonary effort is normal.     Breath sounds:  Normal breath sounds.  Musculoskeletal:     Cervical back: Normal range of motion and neck supple.     Comments: Normal Muscle Bulk and Muscle Testing Reveals:  Upper Extremities: Full ROM and Muscle Strength 5/5  Lower Extremities: Full ROM and Muscle Strength 5/5 Arises from chair with ease Narrow Based  Gait     Skin:    General: Skin is warm and dry.  Neurological:     Mental Status: She is alert and oriented to person, place, and time.  Psychiatric:        Mood and Affect: Mood normal.        Behavior: Behavior normal.         Assessment & Plan:  1. Chemotherapy Induced Peripheral Neuropathy: Continue current medication regimen with Gabapentin. Continue to Monitor. 09/15/2021 2. Chronic Pain Syndrome: Refilled Hydrocodone 10/325 mg one tablet three times a day as needed. #90. Second script sent to accommodate scheduled appointment. 09/15/2021 We will continue the opioid monitoring program, this consists of regular clinic visits, examinations, urine drug screen, pill counts as well as use of [place] Controlled Substance Reporting system. A 12 month History has been reviewed on the [place] Controlled Substance Reporting System on 09/15/2021 3. Polyarthralgia: Continue HEP as Tolerated: Continue to Monitor. 09/15/2021   F/U in 1 month

## 2021-09-21 ENCOUNTER — Telehealth: Payer: Self-pay

## 2021-09-21 NOTE — Telephone Encounter (Signed)
LVM to call back to schedule physical in 6 months

## 2021-10-04 ENCOUNTER — Other Ambulatory Visit: Payer: Self-pay

## 2021-10-05 ENCOUNTER — Ambulatory Visit: Payer: Medicare Other | Admitting: Cardiology

## 2021-10-05 ENCOUNTER — Other Ambulatory Visit: Payer: Self-pay

## 2021-10-05 ENCOUNTER — Encounter: Payer: Self-pay | Admitting: Cardiology

## 2021-10-05 VITALS — BP 112/62 | HR 73 | Ht 62.0 in | Wt 161.1 lb

## 2021-10-05 DIAGNOSIS — I7090 Unspecified atherosclerosis: Secondary | ICD-10-CM | POA: Insufficient documentation

## 2021-10-05 DIAGNOSIS — I10 Essential (primary) hypertension: Secondary | ICD-10-CM | POA: Diagnosis not present

## 2021-10-05 DIAGNOSIS — Z8673 Personal history of transient ischemic attack (TIA), and cerebral infarction without residual deficits: Secondary | ICD-10-CM | POA: Diagnosis not present

## 2021-10-05 DIAGNOSIS — E782 Mixed hyperlipidemia: Secondary | ICD-10-CM | POA: Diagnosis not present

## 2021-10-05 NOTE — Progress Notes (Signed)
Cardiology Office Note:    Date:  10/05/2021   ID:  Kristi [name], DOB [birth year], MRN [id number]  PCP:  Mosie Lukes, MD  Cardiologist:  Jenean Lindau, MD   Referring MD: Mosie Lukes, MD    ASSESSMENT:    1. Essential (primary) hypertension   2. Mixed hyperlipidemia   3. H/O: CVA (cerebrovascular accident)   4. Atherosclerotic vascular disease    PLAN:    In order of problems listed above:  Atherosclerotic vascular disease: Secondary prevention stressed with the patient.  Importance of compliance with diet medication stressed and she vocalized understanding.  She walks on a regular basis and has a good exercise protocol. Essential hypertension: Blood pressure stable and diet was emphasized.  Lifestyle modification urged. Mixed dyslipidemia: Lipids were reviewed and she is going to her primary care for blood work in the next few days.  Last lipids are fine and I congratulated her about this. Patient will be seen in follow-up appointment in 6 months or earlier if the patient has any concerns    Medication Adjustments/Labs and Tests Ordered: Current medicines are reviewed at length with the patient today.  Concerns regarding medicines are outlined above.  No orders of the defined types were placed in this encounter.  No orders of the defined types were placed in this encounter.    No chief complaint on file.    History of Present Illness:    Kristi [name] is a 65 y.o. female.  Patient has cerebrovascular atherosclerosis, essential hypertension and dyslipidemia.  She has had a history of stroke.  She denies any problems at this time and takes care of activities of daily living.  No chest pain orthopnea or PND.  At the time of my evaluation, the patient is alert awake oriented and in no distress.  Past Medical History:  Diagnosis Date   Abnormal gait 12/17/2019   Acute blood loss anemia    Anal fissure 05/03/2011   Anal pain 04/27/2021   Anemia    Iron  deficinecy anemia   Anemia 05/14/2017   Anxiety and depression 05/15/2014   Arthritis    Body mass index (BMI) 30.0-30.9, adult 12/17/2019   Breast cancer ([place])    left ,last radiation 2'15, last chemo 8'14   Cervical cancer screening 06/18/2018   Menarche at 11 Regular and moderate flow  history of abnormal pap in past, 1 mild abnormality years ago that resolved spontaneously on recheck G1P1, s/p 1 svd history of abnormal MGM, b/o breast cancer 2014 No concerns today  gyn surgeries. Lumpectomy  LMP around early 2014   Cervical stenosis of spine 12/17/2019   Constipation 11/13/2016   Contracture of axilla 05/20/2013   Debility 01/18/2020   Essential (primary) hypertension 12/17/2019   Flushing 02/20/2014   Gait instability 12/17/2019   Genetic testing 02/20/2019   Negative genetic testing on the Comprehensive Cancer Panel.  The Comprehensive Common Cancer Panel offered by [facility] includes sequencing and/or deletion duplication testing of the following 46 genes: APC, ATM, AXIN2, BAP1, BARD1, BMPR1A, BRCA1, BRCA2, BRIP1, CDH1, CDK4, CDKN2A, CHEK2, EPCAM, FANCC, FH, FLCN, HOXB13, MET, MITF,  MLH1, MSH2, MSH6, MUTYH, NBN, NF1, NTHL1,  PALB2, PMS2, POLD1, POLE, P   H/O: CVA (cerebrovascular accident) 06/19/2020   Hereditary and idiopathic peripheral neuropathy 11/11/2013   Herpes zoster 05/15/2014   History of chicken pox    History of left breast cancer 02/15/2017   History of radiation therapy 09/09/13-10/28/13   45 gray to left  breast, lumpectomy cavity boosted to 63 gray   HTN (hypertension) 11/13/2016   Hyperglycemia 01/09/2016   Hyperlipidemia 05/14/2017   Hypertension    Internal hemorrhoids without complication 07/22/3845   Knee pain, bilateral 11/14/2016   Left upper extremity numbness 06/19/2018   Major depressive disorder, recurrent episode, severe ([place]) 01/12/2014   Malignant neoplasm of upper-inner quadrant of female breast ([place]) 01/24/2013    Left IDC, ER 3%, PR-, Her2neu-   Formatting of this note might be different from the original. Overview:   Left IDC, ER 3%, PR-, Her2neu-  Last Assessment & Plan:  Pt will continue to take her tamoxifen as previously directed. She appears to be tolerating this with only c/o moderate, occ hot flashes.   Mild neurocognitive disorder due to multiple etiologies 03/17/2020   MRSA (methicillin resistant Staphylococcus aureus) 2009   right groin area-no issues now. 04-07-14 PCR screen negative today.   Neuropathy    Neuropathy of hand, left 09/21/2015   Nodule of finger of left hand 11/14/2016   Palpitations 07/30/2020   Personal history of colonic polyps 04/27/2021   Preventative health care 11/13/2016   Right hip pain 11/14/2016   S/P mastectomy, right 12/26/2018   Sebaceous cyst 04/12/2013   Shoulder joint pain 12/12/2013   Spinal stenosis in cervical region 10/09/2019   Strain of rotator cuff 02/24/2014   Stroke ([place]) 06/2020   Vertigo     Past Surgical History:  Procedure Laterality Date   ANAL SPHINCTEROTOMY  04/2011   APPENDECTOMY  1980   AXILLARY LYMPH NODE DISSECTION Left 02/04/2013   Procedure: LEFT AXILLARY LYMPH NODE DISSECTION;  Surgeon: Stark Klein, MD;  Location: [place];  Service: General;  Laterality: Left;  End: 6599   BREAST LUMPECTOMY WITH NEEDLE LOCALIZATION Left 02/04/2013   Procedure: LEFT BREAST LUMPECTOMY WITH NEEDLE LOCALIZATION;  Surgeon: Stark Klein, MD;  Location: [place];  Service: General;  Laterality: Left;   BREAST SURGERY     Lumpectomy in april 2014   [place]  04/2011   ligation   MASTECTOMY Left 02/15/2017   PORT-A-CATH REMOVAL N/A 04/16/2014   Procedure: REMOVAL PORT-A-CATH;  Surgeon: Stark Klein, MD;  Location: [facility];  Service: General;  Laterality: N/A;   PORTACATH PLACEMENT Right 02/04/2013   Procedure: INSERTION PORT-A-CATH;  Surgeon: Stark Klein, MD;  Location: [place];  Service: General;  Laterality: Right;  Start Time: 3570.   SHOULDER ARTHROSCOPY WITH ROTATOR CUFF REPAIR AND  SUBACROMIAL DECOMPRESSION Left 02/24/2014   Procedure: SHOULDER ARTHROSCOPY WITH ROTATOR CUFF REPAIR AND SUBACROMIAL DECOMPRESSION;  Surgeon: Meredith Pel, MD;  Location: [place];  Service: Orthopedics;  Laterality: Left;  LEFT SHOULDER DIAGNOSTIC OPERATIVE ARTHROSCOPY, SUBACROMIAL DECOMPRESSION, ROTATOR CUFF TEAR REPAIR   SIMPLE MASTECTOMY WITH AXILLARY SENTINEL NODE BIOPSY Left 02/15/2017   Procedure: LEFT MASTECTOMY;  Surgeon: Stark Klein, MD;  Location: [place];  Service: General;  Laterality: Left;   TOTAL MASTECTOMY Right 12/26/2018   Procedure: RIGHT BREAST PROPHYLATIC MASTECTOMY;  Surgeon: Stark Klein, MD;  Location: [place];  Service: General;  Laterality: Right;    Current Medications: Current Meds  Medication Sig   anastrozole (ARIMIDEX) 1 MG tablet Take 1 mg by mouth every morning.   Apoaequorin (PREVAGEN) 10 MG CAPS Take 1 capsule by mouth daily.   aspirin EC 325 MG tablet Take 1 tablet (325 mg total) by mouth daily.   atorvastatin (LIPITOR) 40 MG tablet TAKE 1 TABLET BY MOUTH EVERY DAY   gabapentin (NEURONTIN) 300 MG capsule Take 3 capsules (  900 mg total) by mouth at bedtime.   gabapentin (NEURONTIN) 600 MG tablet Take 1 tablet (600 mg total) by mouth 2 (two) times daily.   HYDROcodone-acetaminophen (NORCO) 10-325 MG tablet Take 1 tablet by mouth 3 (three) times daily as needed for pain.   hydrOXYzine (ATARAX/VISTARIL) 50 MG tablet Take 1 tablet (50 mg total) by mouth 2 (two) times daily.   lurasidone (LATUDA) 40 MG TABS tablet Take 40 mg by mouth daily.   Multiple Vitamin (MULTIVITAMIN WITH MINERALS) TABS tablet Take 1 tablet by mouth daily.   polyethylene glycol (MIRALAX / GLYCOLAX) 17 g packet Take 17 g by mouth daily as needed for mild constipation.   pyridOXINE (B-6) 50 MG tablet Take 50 mg by mouth daily.   traZODone (DESYREL) 50 MG tablet Take 1 tablet (50 mg total) by mouth at bedtime.   Turmeric (QC TUMERIC COMPLEX PO) Take 1 tablet by mouth daily.   valsartan (DIOVAN)  160 MG tablet Take 1 tablet (160 mg total) by mouth daily.     Allergies:   Patient has no known allergies.   Social History   Socioeconomic History   Marital status: Married    Spouse name: [name]   Number of children: 1   Years of education: 12   Highest education level: Not on file  Occupational History   Occupation: INVENTORY Hotel manager: [employer]   Occupation: disability no longer working 2015  Tobacco Use   Smoking status: Never   Smokeless tobacco: Never  Vaping Use   Vaping Use: Never used  Substance and Sexual Activity   Alcohol use: No   Drug use: No   Sexual activity: Yes    Comment: lives with husband, disability/retirement. [employer], no dietary restrictions  Other Topics Concern   Not on file  Social History Narrative   Patient is married [name]) and lives at home with her husband.   Patient has one daughter   Patient drinks very little caffeine.   Left handed   Social Determinants of Health   Financial Resource Strain: Low Risk    Difficulty of Paying Living Expenses: Not hard at all  Food Insecurity: No Food Insecurity   Worried About Charity fundraiser in the Last Year: Never true   Ran Out of Food in the Last Year: Never true  Transportation Needs: No Transportation Needs   Lack of Transportation (Medical): No   Lack of Transportation (Non-Medical): No  Physical Activity: Sufficiently Active   Days of Exercise per Week: 7 days   Minutes of Exercise per Session: 60 min  Stress: No Stress Concern Present   Feeling of Stress : Not at all  Social Connections: Moderately Integrated   Frequency of Communication with Friends and Family: More than three times a week   Frequency of Social Gatherings with Friends and Family: More than three times a week   Attends Religious Services: More than 4 times per year   Active Member of [employer] or Organizations: No   Attends Music therapist: Never   Marital Status:  Married     Family History: The patient's family history includes Alcohol abuse in her brother; Arthritis in her daughter; Breast cancer in her cousin and cousin; Breast cancer (age of onset: 63) in her paternal aunt; COPD in her brother; Cancer in her cousin, father, paternal uncle, and sister; Cervical cancer in her paternal aunt; Colon cancer in her paternal aunt; Dementia in her  mother; Diabetes in her mother; Hypertension in her brother, father, and mother; Kidney cancer in her paternal grandmother; Lung cancer in her father; Memory loss in her mother; Ovarian cancer in her cousin; Seizures in her brother; Thyroid cancer in her father.  ROS:   Please see the history of present illness.    All other systems reviewed and are negative.  EKGs/Labs/Other Studies Reviewed:    The following studies were reviewed today: I discussed my findings with the patient at length.   Recent Labs: 03/08/2021: ALT 18; BUN 14; Creatinine, Ser 1.07; Hemoglobin 11.2; Platelets 171.0; Potassium 4.4; Sodium 141; TSH 1.89  Recent Lipid Panel    Component Value Date/Time   CHOL 115 03/08/2021 1422   TRIG 62.0 03/08/2021 1422   HDL 51.40 03/08/2021 1422   CHOLHDL 2 03/08/2021 1422   VLDL 12.4 03/08/2021 1422   LDLCALC 51 03/08/2021 1422    Physical Exam:    VS:  BP 112/62   Pulse 73   Ht 5' 2"  (1.575 m)   Wt 161 lb 1.3 oz (73.1 kg)   LMP 01/20/2013   SpO2 98%   BMI 29.46 kg/m     Wt Readings from Last 3 Encounters:  10/05/21 161 lb 1.3 oz (73.1 kg)  09/15/21 160 lb (72.6 kg)  08/15/21 159 lb 3.2 oz (72.2 kg)     GEN: Patient is in no acute distress HEENT: Normal NECK: No JVD; No carotid bruits LYMPHATICS: No lymphadenopathy CARDIAC: Hear sounds regular, 2/6 systolic murmur at the apex. RESPIRATORY:  Clear to auscultation without rales, wheezing or rhonchi  ABDOMEN: Soft, non-tender, non-distended MUSCULOSKELETAL:  No edema; No deformity  SKIN: Warm and dry NEUROLOGIC:  Alert and oriented  x 3 PSYCHIATRIC:  Normal affect   Signed, Jenean Lindau, MD  10/05/2021 1:12 PM    [facility]

## 2021-10-05 NOTE — Patient Instructions (Signed)

## 2021-10-15 ENCOUNTER — Other Ambulatory Visit: Payer: Self-pay | Admitting: Family Medicine

## 2021-10-17 ENCOUNTER — Other Ambulatory Visit: Payer: Self-pay

## 2021-10-17 ENCOUNTER — Other Ambulatory Visit (HOSPITAL_COMMUNITY): Payer: Self-pay

## 2021-10-17 ENCOUNTER — Encounter: Payer: Self-pay | Admitting: Registered Nurse

## 2021-10-17 ENCOUNTER — Encounter: Payer: Medicare Other | Attending: Physical Medicine and Rehabilitation | Admitting: Registered Nurse

## 2021-10-17 ENCOUNTER — Telehealth: Payer: Self-pay

## 2021-10-17 VITALS — BP 116/72 | HR 65 | Temp 98.5°F | Ht 62.0 in | Wt 163.0 lb

## 2021-10-17 DIAGNOSIS — T451X5A Adverse effect of antineoplastic and immunosuppressive drugs, initial encounter: Secondary | ICD-10-CM | POA: Diagnosis not present

## 2021-10-17 DIAGNOSIS — Z5181 Encounter for therapeutic drug level monitoring: Secondary | ICD-10-CM | POA: Diagnosis not present

## 2021-10-17 DIAGNOSIS — G620 Drug-induced polyneuropathy: Secondary | ICD-10-CM | POA: Diagnosis not present

## 2021-10-17 DIAGNOSIS — G894 Chronic pain syndrome: Secondary | ICD-10-CM | POA: Insufficient documentation

## 2021-10-17 DIAGNOSIS — Z79891 Long term (current) use of opiate analgesic: Secondary | ICD-10-CM | POA: Insufficient documentation

## 2021-10-17 MED ORDER — GABAPENTIN 600 MG PO TABS: 600.0000 mg | ORAL_TABLET | Freq: Two times a day (BID) | ORAL | 3 refills | Status: DC

## 2021-10-17 MED ORDER — HYDROCODONE-ACETAMINOPHEN 10-325 MG PO TABS: 1.0000 | ORAL_TABLET | Freq: Three times a day (TID) | ORAL | 0 refills | Status: DC | PRN

## 2021-10-17 MED ORDER — HYDROCODONE-ACETAMINOPHEN 10-325 MG PO TABS
1.0000 | ORAL_TABLET | Freq: Three times a day (TID) | ORAL | 0 refills | Status: DC | PRN
  Filled 2021-10-17: qty 90, 30d supply, fill #0

## 2021-10-17 MED ORDER — GABAPENTIN 300 MG PO CAPS: 900.0000 mg | ORAL_CAPSULE | Freq: Every day | ORAL | 3 refills | Status: DC

## 2021-10-17 NOTE — Progress Notes (Signed)
Subjective:    Patient ID: Kristi [name], female    DOB: May 06, 1956, 65 y.o.   MRN: [id number]  HPI: Kristi [name] is a 65 y.o. female who returns for follow up appointment for chronic pain and medication refill. She states her pain is located in her bilateral feet with tingling and burning. She rates her pain 6. Her current exercise regime is walking and performing stretching exercises.  Ms. Friese Morphine equivalent is 30.00 MME.   Last UDS was Performed on 08/15/2021, it was consistent.      Pain Inventory Average Pain 6 Pain Right Now 6 My pain is constant, sharp, burning, and aching  In the last 24 hours, has pain interfered with the following? General activity 6 Relation with others 0 Enjoyment of life 5 What TIME of day is your pain at its worst? morning  and evening Sleep (in general) Fair  Pain is worse with: walking, bending, inactivity, standing, and some activites Pain improves with: rest and medication Relief from Meds: 6  Family History  Problem Relation Age of Onset   Lung cancer Father    Hypertension Father    Thyroid cancer Father        dx in his 4s   Cancer Father        lung, thyroid, smoker   Breast cancer Paternal Aunt 27   Colon cancer Paternal Aunt        dx in her 50x   Cervical cancer Paternal Aunt        dzx in her 65s   Ovarian cancer Cousin        dx in her lage 52s   Breast cancer Cousin        maternal first cousin, once removed; dx in her late 83s   Breast cancer Cousin        maternal first cousin once removed; dx in late 55s   Hypertension Mother    Diabetes Mother    Dementia Mother    Memory loss Mother    Hypertension Brother    Seizures Brother        Alcohol induced.   Alcohol abuse Brother        drinker, smoker   COPD Brother    Cancer Paternal Uncle        oral cancer   Kidney cancer Paternal Grandmother    Arthritis Daughter        back surgery   Cancer Cousin        several paternal cousins with brain  cancer, leukemia, and other cancers   Cancer Sister        stomach   Social History   Socioeconomic History   Marital status: Married    Spouse name: [name]   Number of children: 1   Years of education: 12   Highest education level: Not on file  Occupational History   Occupation: INVENTORY Hotel manager: [employer]   Occupation: disability no longer working 2015  Tobacco Use   Smoking status: Never   Smokeless tobacco: Never  Vaping Use   Vaping Use: Never used  Substance and Sexual Activity   Alcohol use: No   Drug use: No   Sexual activity: Yes    Comment: lives with husband, disability/retirement. [employer], no dietary restrictions  Other Topics Concern   Not on file  Social History Narrative   Patient is married [name]) and lives at home with her husband.  Patient has one daughter   Patient drinks very little caffeine.   Left handed   Social Determinants of Health   Financial Resource Strain: Low Risk    Difficulty of Paying Living Expenses: Not hard at all  Food Insecurity: No Food Insecurity   Worried About Charity fundraiser in the Last Year: Never true   Ran Out of Food in the Last Year: Never true  Transportation Needs: No Transportation Needs   Lack of Transportation (Medical): No   Lack of Transportation (Non-Medical): No  Physical Activity: Sufficiently Active   Days of Exercise per Week: 7 days   Minutes of Exercise per Session: 60 min  Stress: No Stress Concern Present   Feeling of Stress : Not at all  Social Connections: Moderately Integrated   Frequency of Communication with Friends and Family: More than three times a week   Frequency of Social Gatherings with Friends and Family: More than three times a week   Attends Religious Services: More than 4 times per year   Active Member of Clubs or Organizations: No   Attends Archivist Meetings: Never   Marital Status: Married   Past Surgical History:   Procedure Laterality Date   ANAL SPHINCTEROTOMY  04/2011   [place] Left 02/04/2013   Procedure: LEFT AXILLARY LYMPH NODE DISSECTION;  Surgeon: Stark Klein, MD;  Location: [facility];  Service: General;  Laterality: Left;  End: 1512   BREAST LUMPECTOMY WITH NEEDLE LOCALIZATION Left 02/04/2013   Procedure: LEFT BREAST LUMPECTOMY WITH NEEDLE LOCALIZATION;  Surgeon: Stark Klein, MD;  Location: [facility];  Service: General;  Laterality: Left;   BREAST SURGERY     Lumpectomy in april 2014   [place]  04/2011   ligation   MASTECTOMY Left 02/15/2017   PORT-A-CATH REMOVAL N/A 04/16/2014   Procedure: REMOVAL PORT-A-CATH;  Surgeon: Stark Klein, MD;  Location: [facility];  Service: General;  Laterality: N/A;   PORTACATH PLACEMENT Right 02/04/2013   Procedure: INSERTION PORT-A-CATH;  Surgeon: Stark Klein, MD;  Location: [facility];  Service: General;  Laterality: Right;  Start Time: 5732.   SHOULDER ARTHROSCOPY WITH ROTATOR CUFF REPAIR AND SUBACROMIAL DECOMPRESSION Left 02/24/2014   Procedure: SHOULDER ARTHROSCOPY WITH ROTATOR CUFF REPAIR AND SUBACROMIAL DECOMPRESSION;  Surgeon: Meredith Pel, MD;  Location: [place] [facility];  Service: Orthopedics;  Laterality: Left;  LEFT SHOULDER DIAGNOSTIC OPERATIVE ARTHROSCOPY, SUBACROMIAL DECOMPRESSION, ROTATOR CUFF TEAR REPAIR   SIMPLE MASTECTOMY WITH AXILLARY SENTINEL NODE BIOPSY Left 02/15/2017   Procedure: LEFT MASTECTOMY;  Surgeon: Stark Klein, MD;  Location: [place];  Service: General;  Laterality: Left;   TOTAL MASTECTOMY Right 12/26/2018   Procedure: RIGHT BREAST PROPHYLATIC MASTECTOMY;  Surgeon: Stark Klein, MD;  Location: [place];  Service: General;  Laterality: Right;   Past Surgical History:  Procedure Laterality Date   ANAL SPHINCTEROTOMY  04/2011   APPENDECTOMY  1980   AXILLARY LYMPH NODE DISSECTION Left 02/04/2013   Procedure: LEFT AXILLARY LYMPH NODE DISSECTION;  Surgeon: Stark Klein, MD;  Location: [place];  Service: General;   Laterality: Left;  End: 2025   BREAST LUMPECTOMY WITH NEEDLE LOCALIZATION Left 02/04/2013   Procedure: LEFT BREAST LUMPECTOMY WITH NEEDLE LOCALIZATION;  Surgeon: Stark Klein, MD;  Location: [place];  Service: General;  Laterality: Left;   BREAST SURGERY     Lumpectomy in april 2014   [place]  04/2011   ligation   MASTECTOMY Left 02/15/2017   PORT-A-CATH REMOVAL N/A  04/16/2014   Procedure: REMOVAL PORT-A-CATH;  Surgeon: Stark Klein, MD;  Location[facility];  Service: General;  Laterality: N/A;   PORTACATH PLACEMENT Right 02/04/2013   Procedure: INSERTION PORT-A-CATH;  Surgeon: Stark Klein, MD;  Location: [place];  Service: General;  Laterality: Right;  Start Time: 4235.   SHOULDER ARTHROSCOPY WITH ROTATOR CUFF REPAIR AND SUBACROMIAL DECOMPRESSION Left 02/24/2014   Procedure: SHOULDER ARTHROSCOPY WITH ROTATOR CUFF REPAIR AND SUBACROMIAL DECOMPRESSION;  Surgeon: Meredith Pel, MD;  Location: [facility];  Service: Orthopedics;  Laterality: Left;  LEFT SHOULDER DIAGNOSTIC OPERATIVE ARTHROSCOPY, SUBACROMIAL DECOMPRESSION, ROTATOR CUFF TEAR REPAIR   SIMPLE MASTECTOMY WITH AXILLARY SENTINEL NODE BIOPSY Left 02/15/2017   Procedure: LEFT MASTECTOMY;  Surgeon: Stark Klein, MD;  Location: [place];  Service: General;  Laterality: Left;   TOTAL MASTECTOMY Right 12/26/2018   Procedure: RIGHT BREAST PROPHYLATIC MASTECTOMY;  Surgeon: Stark Klein, MD;  Location: [facility];  Service: General;  Laterality: Right;   Past Medical History:  Diagnosis Date   Abnormal gait 12/17/2019   Acute blood loss anemia    Anal fissure 05/03/2011   Anal pain 04/27/2021   Anemia    Iron deficinecy anemia   Anemia 05/14/2017   Anxiety and depression 05/15/2014   Arthritis    Body mass index (BMI) 30.0-30.9, adult 12/17/2019   Breast cancer ([place])    left ,last radiation 2'15, last chemo 8'14   Cervical cancer screening 06/18/2018   Menarche at 11 Regular and moderate flow  history of abnormal pap in past, 1 mild abnormality  years ago that resolved spontaneously on recheck G1P1, s/p 1 svd history of abnormal MGM, b/o breast cancer 2014 No concerns today  gyn surgeries. Lumpectomy  LMP around early 2014   Cervical stenosis of spine 12/17/2019   Constipation 11/13/2016   Contracture of axilla 05/20/2013   Debility 01/18/2020   Essential (primary) hypertension 12/17/2019   Fl[place] 02/20/2014   Gait instability 12/17/2019   Genetic testing 02/20/2019   Negative genetic testing on the Comprehensive Cancer Panel.  The Comprehensive Common Cancer Panel offered by [facility] includes sequencing and/or deletion duplication testing of the following 46 genes: APC, ATM, AXIN2, BAP1, BARD1, BMPR1A, BRCA1, BRCA2, BRIP1, CDH1, CDK4, CDKN2A, CHEK2, EPCAM, FANCC, FH, FLCN, HOXB13, MET, MITF,  MLH1, MSH2, MSH6, MUTYH, NBN, NF1, NTHL1,  PALB2, PMS2, POLD1, POLE, P   H/O: CVA (cerebrovascular accident) 06/19/2020   Hereditary and idiopathic peripheral neuropathy 11/11/2013   Herpes zoster 05/15/2014   History of chicken pox    History of left breast cancer 02/15/2017   History of radiation therapy 09/09/13-10/28/13   45 gray to left breast, lumpectomy cavity boosted to 63 gray   HTN (hypertension) 11/13/2016   Hyperglycemia 01/09/2016   Hyperlipidemia 05/14/2017   Hypertension    Internal hemorrhoids without complication 3/61/4431   Knee pain, bilateral 11/14/2016   Left upper extremity numbness 06/19/2018   Major depressive disorder, recurrent episode, severe ([place]) 01/12/2014   Malignant neoplasm of upper-inner quadrant of female breast ([place]) 01/24/2013    Left IDC, ER 3%, PR-, Her2neu-  Formatting of this note might be different from the original. Overview:   Left IDC, ER 3%, PR-, Her2neu-  Last Assessment & Plan:  Pt will continue to take her tamoxifen as previously directed. She appears to be tolerating this with only c/o moderate, occ hot flashes.   Mild neurocognitive disorder due to multiple etiologies 03/17/2020   MRSA  (methicillin resistant Staphylococcus aureus) 2009   right groin area-no issues now.  04-07-14 PCR screen negative today.   Neuropathy    Neuropathy of hand, left 09/21/2015   Nodule of finger of left hand 11/14/2016   Palpitations 07/30/2020   Personal history of colonic polyps 04/27/2021   Preventative health care 11/13/2016   Right hip pain 11/14/2016   S/P mastectomy, right 12/26/2018   Sebaceous cyst 04/12/2013   Shoulder joint pain 12/12/2013   Spinal stenosis in cervical region 10/09/2019   Strain of rotator cuff 02/24/2014   Stroke ([place]) 06/2020   Vertigo    LMP 01/20/2013   Opioid Risk Score:   Fall Risk Score:  `1  Depression screen PHQ 2/9  Depression screen [facility] 2/9 09/15/2021 06/16/2021 06/01/2021 04/21/2021 03/08/2021 11/10/2020 06/30/2020  Decreased Interest 0 0 0 0 1 1 1   Down, Depressed, Hopeless 0 0 0 0 1 1 1   PHQ - 2 Score 0 0 0 0 2 2 2   Altered sleeping - - - - 0 - -  Tired, decreased energy - - - - 1 - -  Change in appetite - - - - 0 - -  Feeling bad or failure about yourself  - - - - 0 - -  Trouble concentrating - - - - 0 - -  Moving slowly or fidgety/restless - - - - 0 - -  Suicidal thoughts - - - - 0 - -  PHQ-9 Score - - - - 3 - -  Some recent data might be hidden    Review of Systems  Musculoskeletal:        Pain in both feet, hand, & left shoulder  All other systems reviewed and are negative.     Objective:   Physical Exam Vitals and nursing note reviewed.  Constitutional:      Appearance: Normal appearance.  Cardiovascular:     Rate and Rhythm: Normal rate and regular rhythm.     Pulses: Normal pulses.     Heart sounds: Normal heart sounds.  Pulmonary:     Effort: Pulmonary effort is normal.     Breath sounds: Normal breath sounds.  Musculoskeletal:     Cervical back: Normal range of motion and neck supple.     Comments: Normal Muscle Bulk and Muscle Testing Reveals:  Upper Extremities: Full ROM and Muscle Strength  5/5 Lower Extremities: Full  ROM and Muscle Strength 5/5 Arises from Table with Ease Narrow Based  Gait     Skin:    General: Skin is warm and dry.  Neurological:     Mental Status: She is alert and oriented to person, place, and time.  Psychiatric:        Mood and Affect: Mood normal.        Behavior: Behavior normal.         Assessment & Plan:  1. Chemotherapy Induced Peripheral Neuropathy: Continue current medication regimen with Gabapentin. Continue to Monitor. 10/17/2021 2. Chronic Pain Syndrome: Refilled Hydrocodone 10/325 mg one tablet three times a day as needed. #90. Second script sent to accommodate scheduled appointment. 10/17/2021 We will continue the opioid monitoring program, this consists of regular clinic visits, examinations, urine drug screen, pill counts as well as use of [place] Controlled Substance Reporting system. A 12 month History has been reviewed on the [place] Controlled Substance Reporting System on 10/17/2021 3. Polyarthralgia: Continue HEP as Tolerated: Continue to Monitor. 10/17/2021   F/U in 1 month

## 2021-10-23 ENCOUNTER — Encounter: Payer: Self-pay | Admitting: Registered Nurse

## 2021-11-02 IMAGING — CT CT ANGIO NECK
2 of 11 series · 7 of 33 positions shown · IV contrast (omnipaque)
Comparison: CT head without contrast 06/15/2020. MR head without
contrast 06/16/2020.

CLINICAL DATA: Cerebellar infarct.  Headache and dizziness.



[Series 9: cta neck · axial · 0.40mm/px · z∈[-205,-87]mm · 2 of 178 slices shown]
[im 60/178  soft-tissue]
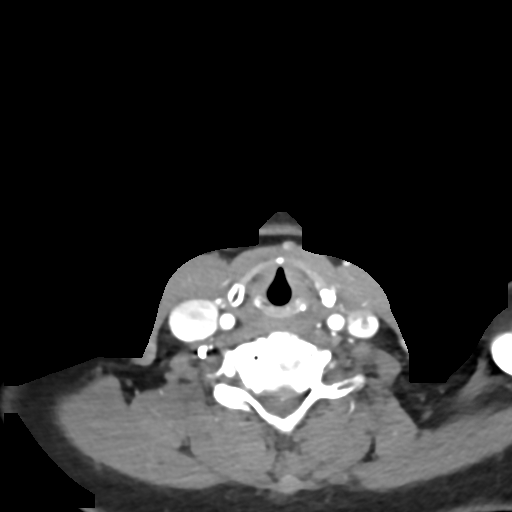
[im 119/178  soft-tissue]
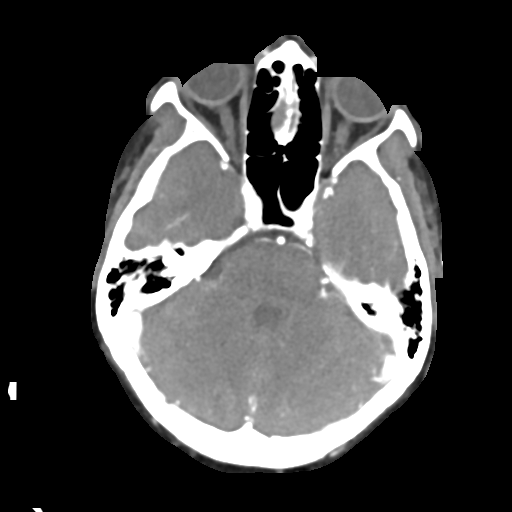

[Series 11: cta neck axial · axial · 0.39mm/px · z∈[-264,-28]mm · 5 of 354 slices shown]
[im 59/354  soft-tissue]
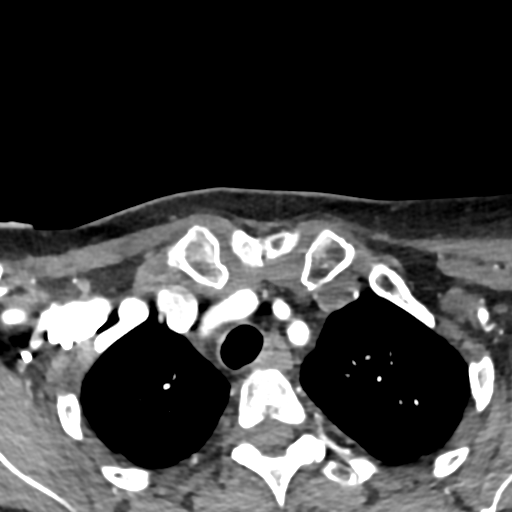
[im 118/354  bone]
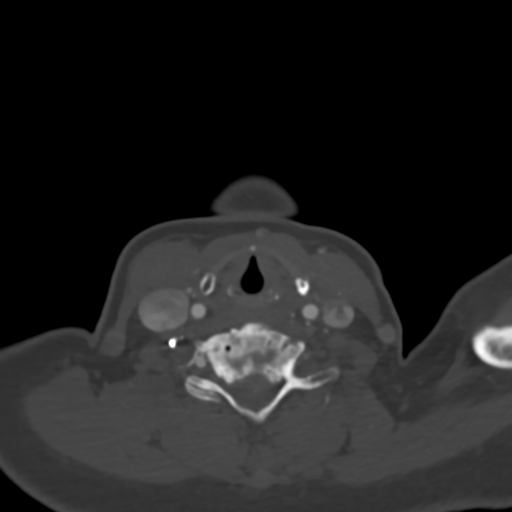
[im 177/354  soft-tissue]
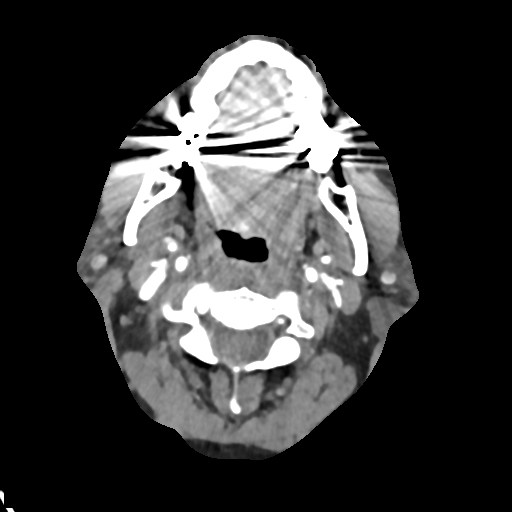
[im 236/354  bone]
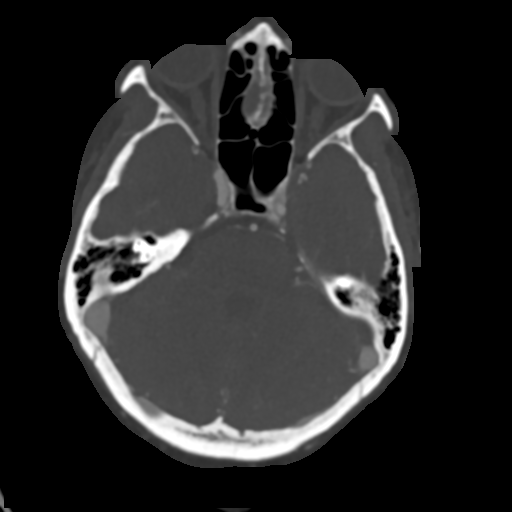
[im 295/354  soft-tissue]
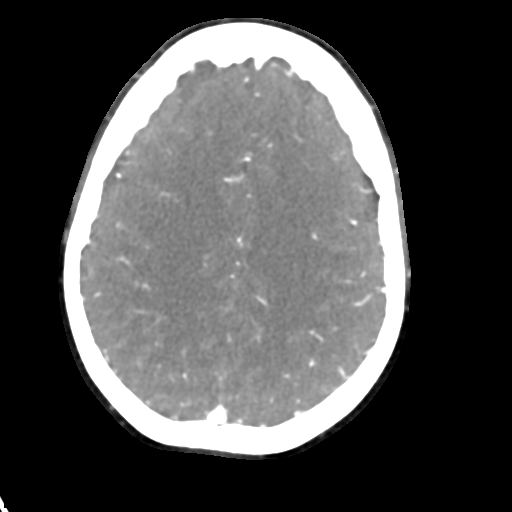

[7 of 33 positions shown; findings below may reference images not displayed]

FINDINGS: CT HEAD FINDINGS

Brain: Acute/subacute nonhemorrhagic infarct is again noted in the
inferomedial left cerebellum and posteromedial right cerebellum. No
new infarcts are present. No acute hemorrhage is present.
Supratentorial structures are within normal limits. Ventricles are
normal. No significant extraaxial fluid collection is present.

Vascular: Atherosclerotic changes are present within the cavernous
internal carotid arteries bilaterally without a hyperdense vessel.

Skull: Calvarium is intact. No focal lytic or blastic lesions are
present. No significant extracranial soft tissue lesion is present.

Sinuses: The paranasal sinuses and mastoid air cells are clear.

Orbits: The globes and orbits are within normal limits.

Review of the MIP images confirms the above findings

CTA NECK FINDINGS

Aortic arch: A 3 vessel arch configuration is present. Minimal
atherosclerotic changes present without stenosis or aneurysm.

Right carotid system: The right common carotid artery is within
normal limits. Minimal irregularity is present at the bifurcation
without significant stenosis. Subtle narrowing is present in the mid
cervical right ICA without significant stenosis. Minimal vessel wall
irregularity is present.

Left carotid system: The left common carotid artery is within normal
limits. The bifurcation is unremarkable. The proximal left ICA is
normal. High-grade stenosis is present in the proximal left external
carotid artery. There is some vessel wall irregularity in the mid
cervical left ICA without significant stenosis.

Vertebral arteries: The left vertebral artery is the dominant
vessel. Scratched at the right vertebral artery is the dominant
vessel, originating from the subclavian artery. High-grade stenosis
present at the origin of the left vertebral artery with occlusion
less than 1 cm from the origin. The vessel is reconstituted at the
level of C6. A hypoplastic left vertebral artery demonstrates no
other significant stenoses or occlusion. No significant stenosis is
present in the cervical right vertebral artery.

Skeleton: Degenerative changes of the cervical spine are most
evident at C4-5, C5-6, and C6-7 with uncovertebral spurring and
reversal of the normal cervical lordosis. No focal lytic or blastic
lesions are present. Vertebral body heights are maintained.

Other neck: The soft tissues of the neck are otherwise within normal
limits.

Upper chest: Lung apices are clear. Thoracic inlet is within normal
limits.

Review of the MIP images confirms the above findings

CTA HEAD FINDINGS

Anterior circulation: Minimal atherosclerotic changes are present
within the cavernous internal carotid arteries bilaterally without
stenosis. Internal carotid arteries are otherwise within normal
limits through the ICA termini. The A1 and M1 segments are normal.
The anterior communicating artery is patent. MCA bifurcations are
normal. ACA and MCA branch vessels are within normal limits.

Posterior circulation: The right vertebral artery is the dominant
vessel. PICA origins are visualized and normal. Hypoplastic V4
segment extends to the vertebrobasilar junction on the left. The
basilar artery is normal. Both posterior cerebral arteries originate
from the basilar tip. The PCA branch vessels are within normal
limits.

Venous sinuses: The dural sinuses are patent. The straight sinus and
deep cerebral veins are patent. The right transverse sinus is
dominant. Cortical veins are unremarkable.

Anatomic variants: None

Review of the MIP images confirms the above findings
IMPRESSION: 1. High-grade stenosis at the origin of the left vertebral artery
with occlusion less than 1 cm from the origin. The vessel is
reconstituted at the level of C6.
2. The right vertebral artery is dominant.
3. High-grade stenosis at the origin of the left external carotid
artery.
4. Minimal atherosclerotic changes at the right carotid bifurcation
and cavernous internal carotid arteries bilaterally without
significant stenosis.
5. Minimal vessel wall irregularity in the mid cervical internal
carotid arteries bilaterally without significant stenosis. This may
represent fibromuscular dysplasia.
6. Multilevel spondylosis of the cervical spine.

## 2021-11-15 ENCOUNTER — Telehealth: Payer: Self-pay

## 2021-11-15 MED ORDER — HYDROCODONE-ACETAMINOPHEN 10-325 MG PO TABS: 1.0000 | ORAL_TABLET | Freq: Three times a day (TID) | ORAL | 0 refills | Status: DC | PRN

## 2021-11-15 NOTE — Telephone Encounter (Signed)
Patient called to request pain medication refill. Last visit 10/17/21, next visit 11/17/21.  PMP REPORT:  Filled  Drug  QTY  Days  Prescriber  Dispenser  PMP   10/17/2021  Hydrocodone-Acetamin 10-325 Mg 90.00 30 Eu Tho Mos ([id number]) [place]

## 2021-11-15 NOTE — Telephone Encounter (Signed)
PMP was Reviewed.  Hydrocodone e-scribed today.  Placed a call to Ms. Goettel, she verbalizes understanding.

## 2021-11-17 ENCOUNTER — Encounter: Payer: Medicare Other | Admitting: Physical Medicine and Rehabilitation

## 2021-11-18 ENCOUNTER — Ambulatory Visit: Payer: Medicare Other | Admitting: Physical Medicine and Rehabilitation

## 2021-11-29 ENCOUNTER — Encounter
Payer: Medicare Other | Attending: Physical Medicine and Rehabilitation | Admitting: Physical Medicine and Rehabilitation

## 2021-11-29 ENCOUNTER — Other Ambulatory Visit: Payer: Self-pay

## 2021-11-29 VITALS — BP 144/75 | HR 60 | Ht 62.0 in | Wt 165.0 lb

## 2021-11-29 DIAGNOSIS — L608 Other nail disorders: Secondary | ICD-10-CM | POA: Insufficient documentation

## 2021-11-29 DIAGNOSIS — G620 Drug-induced polyneuropathy: Secondary | ICD-10-CM | POA: Diagnosis not present

## 2021-11-29 DIAGNOSIS — T451X5A Adverse effect of antineoplastic and immunosuppressive drugs, initial encounter: Secondary | ICD-10-CM | POA: Insufficient documentation

## 2021-11-30 NOTE — Progress Notes (Signed)
Subjective:    Patient ID: Kristi [name], female    DOB: 1955/12/04, 66 y.o.   MRN: [id number]  HPI Kristi [name] is a 66 year old female who presents for f/u of cerebral artery stroke and chemotherapy-induced peripheral neuropathy.  1) History of left breast cancer:  -She has been taking a pill of turmeric daily.  -She does use sugar at times- educated regarding benefits of ketogenic diet to keep cancer in remission. She has been trying to eat less sugar.   2) Chemotherapy induced peripheral neuropathy:  -Her pain has been stable.  -She continues to take Norco up to three times per day.  -If she skips her middle dose she does note increased pain.  -She continues to take Gabapentin 600/600/900. Can replace 600 with 800 on some days. Pain has been getting worse -Pain is constant -Worse with walking.  -pain has worsened.  -Discussed Qutenza as an option for neuropathic pain control. Discussed that this is a capsaicin patch, stronger than capsaicin cream. Discussed that it is currently approved for diabetic peripheral neuropathy and post-herpetic neuralgia, but that it has also shown benefit in treating other forms of neuropathy. Provided patient with link to site to learn more about the patch: CinemaBonus.fr. Discussed that the patch would be placed in office and benefits usually last 3 months. Discussed that unintended exposure to capsaicin can cause severe irritation of eyes, mucous membranes, respiratory tract, and skin, but that Qutenza is a local treatment and does not have the systemic side effects of other nerve medications. Discussed that there may be pain, itching, erythema, and decreased sensory function associated with the application of Qutenza. Side effects usually subside within 1 week. A cold pack of analgesic medications can help with these side effects. Blood pressure can also be increased due to pain associated with administration of the patch.   3) General health:   -She has been trying to do some resistance training every day.  -Since last visit she has increased her daily walk to 40 minutes despite the cold! She does 6-7,000 steps. She usually does this around 9-10am. She enjoys walking more than other types of exercise.  -she has been doing some resistance training but not as frequet  4) HTN:  -Her BP continues to be much better controlled. -89/71, 117/71 at home. She usually checks it in the morning.   -Soft in office today and it is usually this low on her home reads.  -She asks if she should be taking her Avapro in the morning. The highest she goes systolic is 263.  -discussed that she can lower dose if consistently well controlled  5) Vitiligo:  -She shows me a decrease in pigmentation in her left axilla, chest, abdomen, and back of her neck and asks what this may be from. -Stable  6) History of stroke: -She has followed up with neurology and cardiology and a Holter monitor has been placed. She has not been advised of any abnormal rhythms thus far.   7) Toenail loss -lost since chemotherapy -not painful but bothersome to her   Pain Inventory Average Pain 6 Pain Right Now 6 My pain is intermittent, burning, dull, tingling and aching  In the last 24 hours, has pain interfered with the following? General activity 5 Relation with others 4 Enjoyment of life 5 What TIME of day is your pain at its worst? morning , daytime and evening Sleep (in general) Fair  Pain is worse with: walking, sitting and standing Pain  improves with: medication Relief from Meds: 5  Family History  Problem Relation Age of Onset   Lung cancer Father    Hypertension Father    Thyroid cancer Father        dx in his 37s   Cancer Father        lung, thyroid, smoker   Breast cancer Paternal Aunt 37   Colon cancer Paternal Aunt        dx in her 50x   Cervical cancer Paternal Aunt        dzx in her 43s   Ovarian cancer Cousin        dx in her lage 68s    Breast cancer Cousin        maternal first cousin, once removed; dx in her late 50s   Breast cancer Cousin        maternal first cousin once removed; dx in late 109s   Hypertension Mother    Diabetes Mother    Dementia Mother    Memory loss Mother    Hypertension Brother    Seizures Brother        Alcohol induced.   Alcohol abuse Brother        drinker, smoker   COPD Brother    Cancer Paternal Uncle        oral cancer   Kidney cancer Paternal Grandmother    Arthritis Daughter        back surgery   Cancer Cousin        several paternal cousins with brain cancer, leukemia, and other cancers   Cancer Sister        stomach   Social History   Socioeconomic History   Marital status: Married    Spouse name: [name]   Number of children: 1   Years of education: 12   Highest education level: Not on file  Occupational History   Occupation: INVENTORY Hotel manager: [employer]   Occupation: disability no longer working 2015  Tobacco Use   Smoking status: Never   Smokeless tobacco: Never  Vaping Use   Vaping Use: Never used  Substance and Sexual Activity   Alcohol use: No   Drug use: No   Sexual activity: Yes    Comment: lives with husband, disability/retirement. [employer], no dietary restrictions  Other Topics Concern   Not on file  Social History Narrative   Patient is married [name]) and lives at home with her husband.   Patient has one daughter   Patient drinks very little caffeine.   Left handed   Social Determinants of Health   Financial Resource Strain: Low Risk    Difficulty of Paying Living Expenses: Not hard at all  Food Insecurity: No Food Insecurity   Worried About Charity fundraiser in the Last Year: Never true   Ran Out of Food in the Last Year: Never true  Transportation Needs: No Transportation Needs   Lack of Transportation (Medical): No   Lack of Transportation (Non-Medical): No  Physical Activity: Sufficiently Active    Days of Exercise per Week: 7 days   Minutes of Exercise per Session: 60 min  Stress: No Stress Concern Present   Feeling of Stress : Not at all  Social Connections: Moderately Integrated   Frequency of Communication with Friends and Family: More than three times a week   Frequency of Social Gatherings with Friends and Family: More than three times a week  Attends Religious Services: More than 4 times per year   Active Member of Clubs or Organizations: No   Attends Archivist Meetings: Never   Marital Status: Married   Past Surgical History:  Procedure Laterality Date   ANAL SPHINCTEROTOMY  04/2011   APPENDECTOMY  1980   AXILLARY LYMPH NODE DISSECTION Left 02/04/2013   Procedure: LEFT AXILLARY LYMPH NODE DISSECTION;  Surgeon: Stark Klein, MD;  Location: [place];  Service: General;  Laterality: Left;  End: 9937   BREAST LUMPECTOMY WITH NEEDLE LOCALIZATION Left 02/04/2013   Procedure: LEFT BREAST LUMPECTOMY WITH NEEDLE LOCALIZATION;  Surgeon: Stark Klein, MD;  Location: [place];  Service: General;  Laterality: Left;   BREAST SURGERY     Lumpectomy in april 2014   [place]  04/2011   ligation   MASTECTOMY Left 02/15/2017   PORT-A-CATH REMOVAL N/A 04/16/2014   Procedure: REMOVAL PORT-A-CATH;  Surgeon: Stark Klein, MD;  Location: [facility];  Service: General;  Laterality: N/A;   PORTACATH PLACEMENT Right 02/04/2013   Procedure: INSERTION PORT-A-CATH;  Surgeon: Stark Klein, MD;  Location: [place];  Service: General;  Laterality: Right;  Start Time: 1696.   SHOULDER ARTHROSCOPY WITH ROTATOR CUFF REPAIR AND SUBACROMIAL DECOMPRESSION Left 02/24/2014   Procedure: SHOULDER ARTHROSCOPY WITH ROTATOR CUFF REPAIR AND SUBACROMIAL DECOMPRESSION;  Surgeon: Meredith Pel, MD;  Location: [place];  Service: Orthopedics;  Laterality: Left;  LEFT SHOULDER DIAGNOSTIC OPERATIVE ARTHROSCOPY, SUBACROMIAL DECOMPRESSION, ROTATOR CUFF TEAR REPAIR   SIMPLE MASTECTOMY WITH AXILLARY SENTINEL NODE BIOPSY  Left 02/15/2017   Procedure: LEFT MASTECTOMY;  Surgeon: Stark Klein, MD;  Location: [place];  Service: General;  Laterality: Left;   TOTAL MASTECTOMY Right 12/26/2018   Procedure: RIGHT BREAST PROPHYLATIC MASTECTOMY;  Surgeon: Stark Klein, MD;  Location: [place];  Service: General;  Laterality: Right;   Past Surgical History:  Procedure Laterality Date   ANAL SPHINCTEROTOMY  04/2011   APPENDECTOMY  1980   AXILLARY LYMPH NODE DISSECTION Left 02/04/2013   Procedure: LEFT AXILLARY LYMPH NODE DISSECTION;  Surgeon: Stark Klein, MD;  Location: [place];  Service: General;  Laterality: Left;  End: 7893   BREAST LUMPECTOMY WITH NEEDLE LOCALIZATION Left 02/04/2013   Procedure: LEFT BREAST LUMPECTOMY WITH NEEDLE LOCALIZATION;  Surgeon: Stark Klein, MD;  Location: [place];  Service: General;  Laterality: Left;   BREAST SURGERY     Lumpectomy in april 2014   [place]  04/2011   ligation   MASTECTOMY Left 02/15/2017   PORT-A-CATH REMOVAL N/A 04/16/2014   Procedure: REMOVAL PORT-A-CATH;  Surgeon: Stark Klein, MD;  Location[facility];  Service: General;  Laterality: N/A;   PORTACATH PLACEMENT Right 02/04/2013   Procedure: INSERTION PORT-A-CATH;  Surgeon: Stark Klein, MD;  Location: [place];  Service: General;  Laterality: Right;  Start Time: 8101.   SHOULDER ARTHROSCOPY WITH ROTATOR CUFF REPAIR AND SUBACROMIAL DECOMPRESSION Left 02/24/2014   Procedure: SHOULDER ARTHROSCOPY WITH ROTATOR CUFF REPAIR AND SUBACROMIAL DECOMPRESSION;  Surgeon: Meredith Pel, MD;  Location: [place] [place];  Service: Orthopedics;  Laterality: Left;  LEFT SHOULDER DIAGNOSTIC OPERATIVE ARTHROSCOPY, SUBACROMIAL DECOMPRESSION, ROTATOR CUFF TEAR REPAIR   SIMPLE MASTECTOMY WITH AXILLARY SENTINEL NODE BIOPSY Left 02/15/2017   Procedure: LEFT MASTECTOMY;  Surgeon: Stark Klein, MD;  Location: [place];  Service: General;  Laterality: Left;   TOTAL MASTECTOMY Right 12/26/2018   Procedure: RIGHT BREAST PROPHYLATIC MASTECTOMY;  Surgeon: Stark Klein, MD;   Location: [place];  Service: General;  Laterality: Right;   Past Medical History:  Diagnosis Date   Abnormal gait 12/17/2019   Acute blood loss anemia    Anal fissure 05/03/2011   Anal pain 04/27/2021   Anemia    Iron deficinecy anemia   Anemia 05/14/2017   Anxiety and depression 05/15/2014   Arthritis    Body mass index (BMI) 30.0-30.9, adult 12/17/2019   Breast cancer ([place])    left ,last radiation 2'15, last chemo 8'14   Cervical cancer screening 06/18/2018   Menarche at 11 Regular and moderate flow  history of abnormal pap in past, 1 mild abnormality years ago that resolved spontaneously on recheck G1P1, s/p 1 svd history of abnormal MGM, b/o breast cancer 2014 No concerns today  gyn surgeries. Lumpectomy  LMP around early 2014   Cervical stenosis of spine 12/17/2019   Constipation 11/13/2016   Contracture of axilla 05/20/2013   Debility 01/18/2020   Essential (primary) hypertension 12/17/2019   Flushing 02/20/2014   Gait instability 12/17/2019   Genetic testing 02/20/2019   Negative genetic testing on the Comprehensive Cancer Panel.  The Comprehensive Common Cancer Panel offered by [facility] includes sequencing and/or deletion duplication testing of the following 46 genes: APC, ATM, AXIN2, BAP1, BARD1, BMPR1A, BRCA1, BRCA2, BRIP1, CDH1, CDK4, CDKN2A, CHEK2, EPCAM, FANCC, FH, FLCN, HOXB13, MET, MITF,  MLH1, MSH2, MSH6, MUTYH, NBN, NF1, NTHL1,  PALB2, PMS2, POLD1, POLE, P   H/O: CVA (cerebrovascular accident) 06/19/2020   Hereditary and idiopathic peripheral neuropathy 11/11/2013   Herpes zoster 05/15/2014   History of chicken pox    History of left breast cancer 02/15/2017   History of radiation therapy 09/09/13-10/28/13   45 gray to left breast, lumpectomy cavity boosted to 63 gray   HTN (hypertension) 11/13/2016   Hyperglycemia 01/09/2016   Hyperlipidemia 05/14/2017   Hypertension    Internal hemorrhoids without complication 06/30/538   Knee pain, bilateral 11/14/2016   Left  upper extremity numbness 06/19/2018   Major depressive disorder, recurrent episode, severe ([place]) 01/12/2014   Malignant neoplasm of upper-inner quadrant of female breast ([place]) 01/24/2013    Left IDC, ER 3%, PR-, Her2neu-  Formatting of this note might be different from the original. Overview:   Left IDC, ER 3%, PR-, Her2neu-  Last Assessment & Plan:  Pt will continue to take her tamoxifen as previously directed. She appears to be tolerating this with only c/o moderate, occ hot flashes.   Mild neurocognitive disorder due to multiple etiologies 03/17/2020   MRSA (methicillin resistant Staphylococcus aureus) 2009   right groin area-no issues now. 04-07-14 PCR screen negative today.   Neuropathy    Neuropathy of hand, left 09/21/2015   Nodule of finger of left hand 11/14/2016   Palpitations 07/30/2020   Personal history of colonic polyps 04/27/2021   Preventative health care 11/13/2016   Right hip pain 11/14/2016   S/P mastectomy, right 12/26/2018   Sebaceous cyst 04/12/2013   Shoulder joint pain 12/12/2013   Spinal stenosis in cervical region 10/09/2019   Strain of rotator cuff 02/24/2014   Stroke ([place]) 06/2020   Vertigo    BP (!) 144/75    Pulse 60    Ht 5' 2"  (1.575 m)    Wt 165 lb (74.8 kg)    LMP 01/20/2013    SpO2 99%    BMI 30.18 kg/m   Opioid Risk Score:   Fall Risk Score:  `1  Depression screen PHQ 2/9  Depression screen [facility] 2/9 11/29/2021 10/17/2021 09/15/2021 06/16/2021 06/01/2021 04/21/2021 03/08/2021  Decreased Interest 0 1 0 0 0 0  1  Down, Depressed, Hopeless 0 1 0 0 0 0 1  PHQ - 2 Score 0 2 0 0 0 0 2  Altered sleeping - - - - - - 0  Tired, decreased energy - - - - - - 1  Change in appetite - - - - - - 0  Feeling bad or failure about yourself  - - - - - - 0  Trouble concentrating - - - - - - 0  Moving slowly or fidgety/restless - - - - - - 0  Suicidal thoughts - - - - - - 0  PHQ-9 Score - - - - - - 3  Some recent data might be hidden   Review of Systems  Constitutional:  Negative.   HENT: Negative.    Eyes: Negative.   Respiratory: Negative.    Cardiovascular: Negative.   Gastrointestinal: Negative.   Endocrine: Negative.   Genitourinary: Negative.   Musculoskeletal:        Hand and feet pain  Skin: Negative.   Allergic/Immunologic: Negative.   Neurological: Negative.   Hematological: Negative.   Psychiatric/Behavioral: Negative.    All other systems reviewed and are negative.     Objective:   Physical Exam Gen: no distress, normal appearing, BMI 30.18, weight 165 lbs HEENT: oral mucosa pink and moist, NCAT Cardio: Reg rate Chest: normal effort, normal rate of breathing Abd: soft, non-distended Ext: no edema Psych: pleasant, normal affect Skin: intact Neuro: Alert and oriented x3. Able to walk in and out of the office well. Normal gait Musculoskeletal: 5/5 strength in bilateral lower extremities and and upper extremities. Sensation intact throughout.   Assessment & Plan:  Kristi [name] is a 66 year old woman who presents for f/u of bilateral foot painful chemo-therapy induced peripheral neuropathy since starting Anastrazole chemotherapy for breast cancer in 2015. She was also admitted to CIR for left cerebellar artery stroke. She also reports loss of skin pigmentation on trunk.   1) Chemotherapy induced peripheral neuropathy:  -Refilled Gabapentin 600/600/88m.  -Pain contract signed previously.   -UDS obtained previously. Personally reviewed and contains expected metabolites.  -PDMP reviewed. Refilled Norco 156mup to 3 times daily. MME 30. Discussed Nucynta as an alternative option and provided her with a copay card for her to discuss with her pharmacy/insurance company regarding cost.   -Provided [facility] for lumbar sympathetic nerve block.  -Discussed Qutenza as an option for neuropathic pain control. Discussed that this is a capsaicin patch, stronger than capsaicin cream. Discussed that it  is currently approved for diabetic peripheral neuropathy and post-herpetic neuralgia, but that it has also shown benefit in treating other forms of neuropathy. Provided patient with link to site to learn more about the patch: htCinemaBonus.frDiscussed that the patch would be placed in office and benefits usually last 3 months. Discussed that unintended exposure to capsaicin can cause severe irritation of eyes, mucous membranes, respiratory tract, and skin, but that Qutenza is a local treatment and does not have the systemic side effects of other nerve medications. Discussed that there may be pain, itching, erythema, and decreased sensory function associated with the application of Qutenza. Side effects usually subside within 1 week. A cold pack of analgesic medications can help with these side effects. Blood pressure can also be increased due to pain associated with administration of the patch.   4 patches of Qutenza was applied to the area of pain. Ice packs were applied during the procedure  to ensure patient comfort. Blood pressure was monitored every 15 minutes. The patient tolerated the procedure well. Post-procedure instructions were given and follow-up has been scheduled.    2) Hypertension/Hypotensionn: BP is much better controlled today. Advised that she log pressures at home and bring to our follow-up appointment. Continue only with half-dose of Valsartan-HCTZ daily. Normalizing BP has helped with dizziness. Explained mechanism for orthostatic hypotension. Discussed that we can go down on BP medication further but she prefers to maintain at this time.  -BP is 89/57 today. Discussed lowering medication but she prefers not to at this time since BP has been higher at home -Advised checking BP daily at home and logging results to bring into follow-up appointment with her PCP and myself. -Reviewed BP meds today. Advised that she can try skipping Avapro and check her BP more frequently that day  as she may not require medication. Discussed side effects of hypotension such as dizziness and fatigue.  -Advised regarding healthy foods that can help lower blood pressure and provided with a list:  HTN: -BP is 107/68today.  -Advised checking BP daily at home and logging results to bring into follow-up appointment with her PCP and myself. -Reviewed BP meds today.  -Advised regarding healthy foods that can help lower blood pressure and provided with a list: 1) citrus foods- high in vitamins and minerals 2) salmon and other fatty fish - reduces inflammation and oxylipins 3) swiss chard (leafy green)- high level of nitrates 4) pumpkin seeds- one of the best natural sources of magnesium 5) Beans and lentils- high in fiber, magnesium, and potassium 6) Berries- high in flavonoids 7) Amaranth (whole grain, can be cooked similarly to rice and oats)- high in magnesium and fiber 8) Pistachios- even more effective at reducing BP than other nuts 9) Carrots- high in phenolic compounds that relax blood vessels and reduce inflammation 10) Celery- contain phthalides that relax tissues of arterial walls 11) Tomatoes- can also improve cholesterol and reduce risk of heart disease 12) Broccoli- good source of magnesium, calcium, and potassium 13) Greek yogurt: high in potassium and calcium 14) Herbs and spices: Celery seed, cilantro, saffron, lemongrass, black cumin, ginseng, cinnamon, cardamom, sweet basil, and ginger 15) Chia and flax seeds- also help to lower cholesterol and blood sugar 16) Beets- high levels of nitrates that relax blood vessels  17) spinach and bananas- high in potassium  -Provided lise of supplements that can help with hypertension:  1) magnesium: one high quality brand is Bioptemizers since it contains all 7 types of magnesium, otherwise over the counter magnesium gluconate 469m is a good option 2) B vitamins 3) vitamin D 4) potassium 5) CoQ10 6) L-arginine 7) Vitamin C 8)  Beetroot -Educated that goal BP is 120/80. -Made goal to incorporate some of the above foods into diet.    3) General health: Educated regarding low carb/sugar diet and discussed her current diet. Advise regarding anti-inflammatory foods. Commended 45 minute walk daily! Advised resistive exercises daily.   4) History of breast cancer: continue ketogenic diet. Continue daily turmeric. Prescribed lymphedema therapy.   5) Vitiligo: Given location where she had radiation, it may be a result of destruction of the pigment producing cells -It is not bothersome to her -Advised to mention to oncologist -Advised that there is no treatment for vitiligo.  -It has been stbale  6) Toenail loss -discussed not likely to grown back since it has been lost since chemotherapy -advised 4 bBolivianuts per day for their selenium content which  helps skin, nails, and hair   All questions answered.

## 2021-12-15 ENCOUNTER — Telehealth: Payer: Self-pay

## 2021-12-15 ENCOUNTER — Other Ambulatory Visit: Payer: Self-pay | Admitting: Physical Medicine and Rehabilitation

## 2021-12-15 MED ORDER — HYDROCODONE-ACETAMINOPHEN 7.5-325 MG PO TABS: 1.0000 | ORAL_TABLET | Freq: Three times a day (TID) | ORAL | 0 refills | Status: DC | PRN

## 2021-12-15 NOTE — Telephone Encounter (Signed)
PMP REPORT:   Filled  Written  ID  Drug  QTY  Days  Prescriber  RX #  Dispenser  Refill  Daily Dose*  Pymt Type  PMP  11/15/2021 11/15/2021 2  Hydrocodone-Acetamin 10-325 Mg 90.00 30 Eu Tho [id number] Nor (2541) 0/0 30.00 MME Medicare [place]

## 2021-12-16 ENCOUNTER — Telehealth: Payer: Self-pay

## 2021-12-16 NOTE — Telephone Encounter (Signed)
Patient called requesting Hydrocodone prescription. Called and informed patient that it was sent to the pharmacy yesterday.

## 2021-12-23 NOTE — Telephone Encounter (Signed)
Rx sent by Dr. Ranell Patrick.  Task completed

## 2021-12-29 ENCOUNTER — Other Ambulatory Visit: Payer: Self-pay

## 2021-12-29 ENCOUNTER — Encounter: Payer: Medicare Other | Attending: Physical Medicine and Rehabilitation | Admitting: Registered Nurse

## 2021-12-29 ENCOUNTER — Encounter: Payer: Self-pay | Admitting: Registered Nurse

## 2021-12-29 VITALS — BP 146/80 | HR 66 | Temp 98.7°F | Ht 62.0 in | Wt 163.0 lb

## 2021-12-29 DIAGNOSIS — G620 Drug-induced polyneuropathy: Secondary | ICD-10-CM | POA: Diagnosis not present

## 2021-12-29 DIAGNOSIS — Z5181 Encounter for therapeutic drug level monitoring: Secondary | ICD-10-CM | POA: Insufficient documentation

## 2021-12-29 DIAGNOSIS — Z79891 Long term (current) use of opiate analgesic: Secondary | ICD-10-CM | POA: Insufficient documentation

## 2021-12-29 DIAGNOSIS — M2550 Pain in unspecified joint: Secondary | ICD-10-CM | POA: Diagnosis not present

## 2021-12-29 DIAGNOSIS — T451X5A Adverse effect of antineoplastic and immunosuppressive drugs, initial encounter: Secondary | ICD-10-CM | POA: Diagnosis not present

## 2021-12-29 DIAGNOSIS — G894 Chronic pain syndrome: Secondary | ICD-10-CM | POA: Diagnosis not present

## 2021-12-29 MED ORDER — GABAPENTIN 600 MG PO TABS: 600.0000 mg | ORAL_TABLET | Freq: Two times a day (BID) | ORAL | 3 refills | Status: DC

## 2021-12-29 MED ORDER — HYDROCODONE-ACETAMINOPHEN 10-325 MG PO TABS: 1.0000 | ORAL_TABLET | Freq: Three times a day (TID) | ORAL | 0 refills | Status: DC | PRN

## 2021-12-29 MED ORDER — GABAPENTIN 300 MG PO CAPS
900.0000 mg | ORAL_CAPSULE | Freq: Every day | ORAL | 3 refills | Status: DC
Start: 2021-12-29 — End: 2022-03-30

## 2021-12-29 NOTE — Patient Instructions (Signed)
Increase Hydrocodone 7.5 mg/325 to 4 times a day as needed for pain.   We will resume your Hydrocodone 10mg  /325 three times a day as needed for pain in March  Inquire about:  Nucynta : Tapentadol  Nucynta 50 mg twice a day #60  Inquire on co-pay

## 2021-12-29 NOTE — Progress Notes (Signed)
Subjective:    Patient ID: Kristi [name], female    DOB: February 03, 1956, 66 y.o.   MRN: [id number]  HPI: Kristi [name] is a 66 y.o. female who returns for follow up appointment for chronic pain and medication refill. She states her pain is located in her bilateral hands and bilateral feet with tingling and burning. Also reports generalized joint pain She rates her pain 7. Her current exercise regime is walking and performing stretching exercises.  Kristi [name] states a few days ago she had chest tightness with tingling in her left arm  and fingers with swelling. She didn't seek medical attention, She denies chest pain at this time. She was instructed to go to the emergency room or call her PCP if this occurs again, she verbalizes understanding.   Kristi [name] states she didn't receive any relief  with Sue Lush Patches, we discussed Tapentadol. She will checked with her insurance company.   Kristi [name] Morphine equivalent is 22.50 MME.   UDS ordered Today.     Pain Inventory Average Pain 7 Pain Right Now 7 My pain is constant, sharp, burning, and aching  In the last 24 hours, has pain interfered with the following? General activity 7 Relation with others 6 Enjoyment of life 5 What TIME of day is your pain at its worst? morning , daytime, evening, and night Sleep (in general) Fair  Pain is worse with: walking, sitting, inactivity, and standing Pain improves with: medication Relief from Meds: 5  Family History  Problem Relation Age of Onset   Lung cancer Father    Hypertension Father    Thyroid cancer Father        dx in his 66s   Cancer Father        lung, thyroid, smoker   Breast cancer Paternal Aunt 40   Colon cancer Paternal Aunt        dx in her 50x   Cervical cancer Paternal Aunt        dzx in her 34s   Ovarian cancer Cousin        dx in her lage 36s   Breast cancer Cousin        maternal first cousin, once removed; dx in her late 9s   Breast cancer Cousin        maternal  first cousin once removed; dx in late 72s   Hypertension Mother    Diabetes Mother    Dementia Mother    Memory loss Mother    Hypertension Brother    Seizures Brother        Alcohol induced.   Alcohol abuse Brother        drinker, smoker   COPD Brother    Cancer Paternal Uncle        oral cancer   Kidney cancer Paternal Grandmother    Arthritis Daughter        back surgery   Cancer Cousin        several paternal cousins with brain cancer, leukemia, and other cancers   Cancer Sister        stomach   Social History   Socioeconomic History   Marital status: Married    Spouse name: [name]   Number of children: 1   Years of education: 12   Highest education level: Not on file  Occupational History   Occupation: INVENTORY Hotel manager: [employer]   Occupation: disability no longer working 2015  Tobacco Use  Smoking status: Never   Smokeless tobacco: Never  Vaping Use   Vaping Use: Never used  Substance and Sexual Activity   Alcohol use: No   Drug use: No   Sexual activity: Yes    Comment: lives with husband, disability/retirement. [employer], no dietary restrictions  Other Topics Concern   Not on file  Social History Narrative   Patient is married [name]) and lives at home with her husband.   Patient has one daughter   Patient drinks very little caffeine.   Left handed   Social Determinants of Health   Financial Resource Strain: Low Risk    Difficulty of Paying Living Expenses: Not hard at all  Food Insecurity: No Food Insecurity   Worried About Charity fundraiser in the Last Year: Never true   Ran Out of Food in the Last Year: Never true  Transportation Needs: No Transportation Needs   Lack of Transportation (Medical): No   Lack of Transportation (Non-Medical): No  Physical Activity: Sufficiently Active   Days of Exercise per Week: 7 days   Minutes of Exercise per Session: 60 min  Stress: No Stress Concern Present   Feeling  of Stress : Not at all  Social Connections: Moderately Integrated   Frequency of Communication with Friends and Family: More than three times a week   Frequency of Social Gatherings with Friends and Family: More than three times a week   Attends Religious Services: More than 4 times per year   Active Member of Clubs or Organizations: No   Attends Archivist Meetings: Never   Marital Status: Married   Past Surgical History:  Procedure Laterality Date   ANAL SPHINCTEROTOMY  04/2011   [place] Left 02/04/2013   Procedure: LEFT AXILLARY LYMPH NODE DISSECTION;  Surgeon: Stark Klein, MD;  Location: [place];  Service: General;  Laterality: Left;  End: 1512   BREAST LUMPECTOMY WITH NEEDLE LOCALIZATION Left 02/04/2013   Procedure: LEFT BREAST LUMPECTOMY WITH NEEDLE LOCALIZATION;  Surgeon: Stark Klein, MD;  Location: [place];  Service: General;  Laterality: Left;   BREAST SURGERY     Lumpectomy in april 2014   [place]  04/2011   ligation   MASTECTOMY Left 02/15/2017   PORT-A-CATH REMOVAL N/A 04/16/2014   Procedure: REMOVAL PORT-A-CATH;  Surgeon: Stark Klein, MD;  [facility];  Service: General;  Laterality: N/A;   PORTACATH PLACEMENT Right 02/04/2013   Procedure: INSERTION PORT-A-CATH;  Surgeon: Stark Klein, MD;  Location: [place];  Service: General;  Laterality: Right;  Start Time: 4944.   SHOULDER ARTHROSCOPY WITH ROTATOR CUFF REPAIR AND SUBACROMIAL DECOMPRESSION Left 02/24/2014   Procedure: SHOULDER ARTHROSCOPY WITH ROTATOR CUFF REPAIR AND SUBACROMIAL DECOMPRESSION;  Surgeon: Meredith Pel, MD;  Location: [place];  Service: Orthopedics;  Laterality: Left;  LEFT SHOULDER DIAGNOSTIC OPERATIVE ARTHROSCOPY, SUBACROMIAL DECOMPRESSION, ROTATOR CUFF TEAR REPAIR   SIMPLE MASTECTOMY WITH AXILLARY SENTINEL NODE BIOPSY Left 02/15/2017   Procedure: LEFT MASTECTOMY;  Surgeon: Stark Klein, MD;  Location: [place];  Service: General;  Laterality: Left;    TOTAL MASTECTOMY Right 12/26/2018   Procedure: RIGHT BREAST PROPHYLATIC MASTECTOMY;  Surgeon: Stark Klein, MD;  Location: [place] [facility];  Service: General;  Laterality: Right;   Past Surgical History:  Procedure Laterality Date   ANAL SPHINCTEROTOMY  04/2011   APPENDECTOMY  1980   AXILLARY LYMPH NODE DISSECTION Left 02/04/2013   Procedure: LEFT AXILLARY LYMPH NODE DISSECTION;  Surgeon: Dorris Fetch  Barry Dienes, MD;  Location: [place];  Service: General;  Laterality: Left;  End: 2992   BREAST LUMPECTOMY WITH NEEDLE LOCALIZATION Left 02/04/2013   Procedure: LEFT BREAST LUMPECTOMY WITH NEEDLE LOCALIZATION;  Surgeon: Stark Klein, MD;  Location: [place];  Service: General;  Laterality: Left;   BREAST SURGERY     Lumpectomy in april 2014   [place]  04/2011   ligation   MASTECTOMY Left 02/15/2017   PORT-A-CATH REMOVAL N/A 04/16/2014   Procedure: REMOVAL PORT-A-CATH;  Surgeon: Stark Klein, MD;  [facility];  Service: General;  Laterality: N/A;   PORTACATH PLACEMENT Right 02/04/2013   Procedure: INSERTION PORT-A-CATH;  Surgeon: Stark Klein, MD;  Location: [place];  Service: General;  Laterality: Right;  Start Time: 4268.   SHOULDER ARTHROSCOPY WITH ROTATOR CUFF REPAIR AND SUBACROMIAL DECOMPRESSION Left 02/24/2014   Procedure: SHOULDER ARTHROSCOPY WITH ROTATOR CUFF REPAIR AND SUBACROMIAL DECOMPRESSION;  Surgeon: Meredith Pel, MD;  Location: [place];  Service: Orthopedics;  Laterality: Left;  LEFT SHOULDER DIAGNOSTIC OPERATIVE ARTHROSCOPY, SUBACROMIAL DECOMPRESSION, ROTATOR CUFF TEAR REPAIR   SIMPLE MASTECTOMY WITH AXILLARY SENTINEL NODE BIOPSY Left 02/15/2017   Procedure: LEFT MASTECTOMY;  Surgeon: Stark Klein, MD;  Location: [place];  Service: General;  Laterality: Left;   TOTAL MASTECTOMY Right 12/26/2018   Procedure: RIGHT BREAST PROPHYLATIC MASTECTOMY;  Surgeon: Stark Klein, MD;  Location: [place];  Service: General;  Laterality: Right;   Past Medical History:  Diagnosis Date   Abnormal gait 12/17/2019    Acute blood loss anemia    Anal fissure 05/03/2011   Anal pain 04/27/2021   Anemia    Iron deficinecy anemia   Anemia 05/14/2017   Anxiety and depression 05/15/2014   Arthritis    Body mass index (BMI) 30.0-30.9, adult 12/17/2019   Breast cancer ([place])    left ,last radiation 2'15, last chemo 8'14   Cervical cancer screening 06/18/2018   Menarche at 11 Regular and moderate flow  history of abnormal pap in past, 1 mild abnormality years ago that resolved spontaneously on recheck G1P1, s/p 1 svd history of abnormal MGM, b/o breast cancer 2014 No concerns today  gyn surgeries. Lumpectomy  LMP around early 2014   Cervical stenosis of spine 12/17/2019   Constipation 11/13/2016   Contracture of axilla 05/20/2013   Debility 01/18/2020   Essential (primary) hypertension 12/17/2019   [place] 02/20/2014   Gait instability 12/17/2019   Genetic testing 02/20/2019   Negative genetic testing on the Comprehensive Cancer Panel.  The Comprehensive Common Cancer Panel offered by [facility] includes sequencing and/or deletion duplication testing of the following 46 genes: APC, ATM, AXIN2, BAP1, BARD1, BMPR1A, BRCA1, BRCA2, BRIP1, CDH1, CDK4, CDKN2A, CHEK2, EPCAM, FANCC, FH, FLCN, HOXB13, MET, MITF,  MLH1, MSH2, MSH6, MUTYH, NBN, NF1, NTHL1,  PALB2, PMS2, POLD1, POLE, P   H/O: CVA (cerebrovascular accident) 06/19/2020   Hereditary and idiopathic peripheral neuropathy 11/11/2013   Herpes zoster 05/15/2014   History of chicken pox    History of left breast cancer 02/15/2017   History of radiation therapy 09/09/13-10/28/13   45 gray to left breast, lumpectomy cavity boosted to 63 gray   HTN (hypertension) 11/13/2016   Hyperglycemia 01/09/2016   Hyperlipidemia 05/14/2017   Hypertension    Internal hemorrhoids without complication 3/41/9622   Knee pain, bilateral 11/14/2016   Left upper extremity numbness 06/19/2018   Major depressive disorder, recurrent episode, severe ([place]) 01/12/2014   Malignant neoplasm of  upper-inner quadrant of female breast ([place]) 01/24/2013    Left  IDC, ER 3%, PR-, Her2neu-  Formatting of this note might be different from the original. Overview:   Left IDC, ER 3%, PR-, Her2neu-  Last Assessment & Plan:  Pt will continue to take her tamoxifen as previously directed. She appears to be tolerating this with only c/o moderate, occ hot flashes.   Mild neurocognitive disorder due to multiple etiologies 03/17/2020   MRSA (methicillin resistant Staphylococcus aureus) 2009   right groin area-no issues now. 04-07-14 PCR screen negative today.   Neuropathy    Neuropathy of hand, left 09/21/2015   Nodule of finger of left hand 11/14/2016   Palpitations 07/30/2020   Personal history of colonic polyps 04/27/2021   Preventative health care 11/13/2016   Right hip pain 11/14/2016   S/P mastectomy, right 12/26/2018   Sebaceous cyst 04/12/2013   Shoulder joint pain 12/12/2013   Spinal stenosis in cervical region 10/09/2019   Strain of rotator cuff 02/24/2014   Stroke ([place]) 06/2020   Vertigo    BP (!) 146/80    Pulse 66    Temp 98.7 F (37.1 C)    Ht _0  (1.575 m)    Wt 163 lb (73.9 kg)    LMP 01/20/2013    SpO2 98%    BMI 29.81 kg/m   Opioid Risk Score:   Fall Risk Score:  `1  Depression screen PHQ 2/9  Depression screen [facility] 2/9 12/29/2021 11/29/2021 10/17/2021 09/15/2021 06/16/2021 06/01/2021 04/21/2021  Decreased Interest 0 0 1 0 0 0 0  Down, Depressed, Hopeless 1 0 1 0 0 0 0  PHQ - 2 Score 1 0 2 0 0 0 0  Altered sleeping - - - - - - -  Tired, decreased energy - - - - - - -  Change in appetite - - - - - - -  Feeling bad or failure about yourself  - - - - - - -  Trouble concentrating - - - - - - -  Moving slowly or fidgety/restless - - - - - - -  Suicidal thoughts - - - - - - -  PHQ-9 Score - - - - - - -  Some recent data might be hidden     Review of Systems  Constitutional: Negative.   HENT: Negative.    Eyes: Negative.   Respiratory: Negative.    Cardiovascular: Negative.    Gastrointestinal: Negative.   Endocrine: Negative.   Genitourinary: Negative.   Musculoskeletal: Negative.   Skin: Negative.   Allergic/Immunologic: Negative.   Neurological: Negative.   Hematological: Negative.   Psychiatric/Behavioral:  Positive for dysphoric mood.       Objective:   Physical Exam Vitals and nursing note reviewed.  Constitutional:      Appearance: Normal appearance.  Cardiovascular:     Rate and Rhythm: Normal rate and regular rhythm.     Pulses: Normal pulses.     Heart sounds: Normal heart sounds.  Pulmonary:     Effort: Pulmonary effort is normal.     Breath sounds: Normal breath sounds.  Musculoskeletal:     Cervical back: Normal range of motion and neck supple.     Comments: Normal Muscle Bulk and Muscle Testing Reveals:  Upper Extremities: Full ROM and Muscle Strength 5/5 Lower Extremities: Full ROM and Muscle Strength 5/5 Arises from Table with ease Narrow Based  Gait       Skin:    General: Skin is warm and dry.  Neurological:     Mental Status: She  is alert and oriented to person, place, and time.  Psychiatric:        Mood and Affect: Mood normal.        Behavior: Behavior normal.         Assessment & Plan:  1. Chemotherapy Induced Peripheral Neuropathy: Continue current medication regimen with Gabapentin. Continue to Monitor. 12/29/2021 2. Chronic Pain Syndrome: Refilled Hydrocodone 10/325 mg one tablet three times a day as needed. #90. We will continue the opioid monitoring program, this consists of regular clinic visits, examinations, urine drug screen, pill counts as well as use of [place] Controlled Substance Reporting system. A 12 month History has been reviewed on the [place] Controlled Substance Reporting System on 12/29/2021 3. Polyarthralgia: Continue HEP as Tolerated: Continue to Monitor. 12/29/2021   F/U in 1 month

## 2022-01-04 ENCOUNTER — Telehealth: Payer: Self-pay | Admitting: *Deleted

## 2022-01-04 LAB — TOXASSURE SELECT,+ANTIDEPR,UR

## 2022-01-04 NOTE — Telephone Encounter (Signed)
Urine drug screen for this encounter is consistent for prescribed medication 

## 2022-01-12 ENCOUNTER — Other Ambulatory Visit: Payer: Self-pay | Admitting: Hematology and Oncology

## 2022-01-12 DIAGNOSIS — C50212 Malignant neoplasm of upper-inner quadrant of left female breast: Secondary | ICD-10-CM

## 2022-01-26 ENCOUNTER — Encounter: Payer: Medicare Other | Attending: Physical Medicine and Rehabilitation | Admitting: Registered Nurse

## 2022-01-26 ENCOUNTER — Other Ambulatory Visit: Payer: Self-pay

## 2022-01-26 VITALS — BP 154/78 | HR 62 | Ht 62.0 in | Wt 162.0 lb

## 2022-01-26 DIAGNOSIS — Z79891 Long term (current) use of opiate analgesic: Secondary | ICD-10-CM | POA: Insufficient documentation

## 2022-01-26 DIAGNOSIS — G620 Drug-induced polyneuropathy: Secondary | ICD-10-CM | POA: Insufficient documentation

## 2022-01-26 DIAGNOSIS — T451X5A Adverse effect of antineoplastic and immunosuppressive drugs, initial encounter: Secondary | ICD-10-CM | POA: Diagnosis not present

## 2022-01-26 DIAGNOSIS — M2550 Pain in unspecified joint: Secondary | ICD-10-CM | POA: Diagnosis not present

## 2022-01-26 DIAGNOSIS — Z5181 Encounter for therapeutic drug level monitoring: Secondary | ICD-10-CM | POA: Insufficient documentation

## 2022-01-26 DIAGNOSIS — G894 Chronic pain syndrome: Secondary | ICD-10-CM | POA: Insufficient documentation

## 2022-01-26 MED ORDER — TAPENTADOL HCL 50 MG PO TABS: 50.0000 mg | ORAL_TABLET | Freq: Three times a day (TID) | ORAL | 0 refills | Status: DC | PRN

## 2022-01-26 MED ORDER — TAPENTADOL HCL ER 50 MG PO TB12: 50.0000 mg | ORAL_TABLET | Freq: Two times a day (BID) | ORAL | 0 refills | Status: DC

## 2022-01-26 NOTE — Progress Notes (Signed)
? ?Subjective:  ? ? Patient ID: Kristi [name], female    DOB: 01-27-1956, 66 y.o.   MRN: [id number] ? ?HPI: Kristi [name] is a 66 y.o. female who returns for follow up appointment for chronic pain and medication refill. She states her pain is located in her . Bilateral feet with tingling and burning. She also reports increase intensity of neuropathic pain. We discussed Nucynta on her last visit and this was discussed with Dr Ranell Patrick who agrees with plan.  ?Nucynta has a dual proposed mechanism of action ascending pathways inhibit nociceptive pain signals through activation of mu-opioid receptors and descending pathways inhibit pain impulses through norepinephrine reuptake inhibition. .She is currently prescribed gabapentin  with no relief noted in her pain, we will prescribe Nucynta. Kristi [name] is  in agreement with treatment modality. ? ?She rates her pain 6. Her current exercise regime is walking and performing stretching exercises. ? ?Kristi [name] Morphine equivalent is 30.00 MME.   Last UDS was Performed on 12/29/2021, it was consistent.  ?  ?Pain Inventory ?Average Pain 6 ?Pain Right Now 6 ?My pain is aching ? ?In the last 24 hours, has pain interfered with the following? ?General activity 4 ?Relation with others 4 ?Enjoyment of life 4 ?What TIME of day is your pain at its worst? morning , daytime, and evening ?Sleep (in general) Fair ? ?Pain is worse with: walking, sitting, inactivity, and standing ?Pain improves with: medication ?Relief from Meds: 6 ? ?Family History  ?Problem Relation Age of Onset  ? Lung cancer Father   ? Hypertension Father   ? Thyroid cancer Father   ?     dx in his 63s  ? Cancer Father   ?     lung, thyroid, smoker  ? Breast cancer Paternal Aunt 28  ? Colon cancer Paternal Aunt   ?     dx in her 50x  ? Cervical cancer Paternal Aunt   ?     dzx in her 62s  ? Ovarian cancer Cousin   ?     dx in her lage 19s  ? Breast cancer Cousin   ?     maternal first cousin, once removed; dx in her late  7s  ? Breast cancer Cousin   ?     maternal first cousin once removed; dx in late 66s  ? Hypertension Mother   ? Diabetes Mother   ? Dementia Mother   ? Memory loss Mother   ? Hypertension Brother   ? Seizures Brother   ?     Alcohol induced.  ? Alcohol abuse Brother   ?     drinker, smoker  ? COPD Brother   ? Cancer Paternal Uncle   ?     oral cancer  ? Kidney cancer Paternal Grandmother   ? Arthritis Daughter   ?     back surgery  ? Cancer Cousin   ?     several paternal cousins with brain cancer, leukemia, and other cancers  ? Cancer Sister   ?     stomach  ? ?Social History  ? ?Socioeconomic History  ? Marital status: Married  ?  Spouse name: [name]  ? Number of children: 1  ? Years of education: 67  ? Highest education level: Not on file  ?Occupational History  ? Occupation: INVENTORY SPECIALIST   ?  Employer: [employer]  ? Occupation: disability no longer working 2015  ?Tobacco Use  ? Smoking status:  Never  ? Smokeless tobacco: Never  ?Vaping Use  ? Vaping Use: Never used  ?Substance and Sexual Activity  ? Alcohol use: No  ? Drug use: No  ? Sexual activity: Yes  ?  Comment: lives with husband, disability/retirement. [employer], no dietary restrictions  ?Other Topics Concern  ? Not on file  ?Social History Narrative  ? Patient is married [name]) and lives at home with her husband.  ? Patient has one daughter  ? Patient drinks very little caffeine.  ? Left handed  ? ?Social Determinants of Health  ? ?Financial Resource Strain: Low Risk   ? Difficulty of Paying Living Expenses: Not hard at all  ?Food Insecurity: No Food Insecurity  ? Worried About Charity fundraiser in the Last Year: Never true  ? Ran Out of Food in the Last Year: Never true  ?Transportation Needs: No Transportation Needs  ? Lack of Transportation (Medical): No  ? Lack of Transportation (Non-Medical): No  ?Physical Activity: Sufficiently Active  ? Days of Exercise per Week: 7 days  ? Minutes of Exercise per Session: 60 min   ?Stress: No Stress Concern Present  ? Feeling of Stress : Not at all  ?Social Connections: Moderately Integrated  ? Frequency of Communication with Friends and Family: More than three times a week  ? Frequency of Social Gatherings with Friends and Family: More than three times a week  ? Attends Religious Services: More than 4 times per year  ? Active Member of Clubs or Organizations: No  ? Attends Archivist Meetings: Never  ? Marital Status: Married  ? ?Past Surgical History:  ?Procedure Laterality Date  ? ANAL SPHINCTEROTOMY  04/2011  ? APPENDECTOMY  1980  ? AXILLARY LYMPH NODE DISSECTION Left 02/04/2013  ? Procedure: LEFT AXILLARY LYMPH NODE DISSECTION;  Surgeon: Stark Klein, MD;  Location: [place];  Service: General;  Laterality: Left;  End: 7517  ? BREAST LUMPECTOMY WITH NEEDLE LOCALIZATION Left 02/04/2013  ? Procedure: LEFT BREAST LUMPECTOMY WITH NEEDLE LOCALIZATION;  Surgeon: Stark Klein, MD;  Location: [place];  Service: General;  Laterality: Left;  ? BREAST SURGERY    ? Lumpectomy in april 2014  ? HEMORRHOID SURGERY  04/2011  ? ligation  ? MASTECTOMY Left 02/15/2017  ? PORT-A-CATH REMOVAL N/A 04/16/2014  ? Procedure: REMOVAL PORT-A-CATH;  Surgeon: Stark Klein, MD;  [facility];  Service: General;  Laterality: N/A;  ? PORTACATH PLACEMENT Right 02/04/2013  ? Procedure: INSERTION PORT-A-CATH;  Surgeon: Stark Klein, MD;  Location: [place];  Service: General;  Laterality: Right;  Start Time: 0017.  ? SHOULDER ARTHROSCOPY WITH ROTATOR CUFF REPAIR AND SUBACROMIAL DECOMPRESSION Left 02/24/2014  ? Procedure: SHOULDER ARTHROSCOPY WITH ROTATOR CUFF REPAIR AND SUBACROMIAL DECOMPRESSION;  Surgeon: Meredith Pel, MD;  Location: [place];  Service: Orthopedics;  Laterality: Left;  LEFT SHOULDER DIAGNOSTIC OPERATIVE ARTHROSCOPY, SUBACROMIAL DECOMPRESSION, ROTATOR CUFF TEAR REPAIR  ? SIMPLE MASTECTOMY WITH AXILLARY SENTINEL NODE BIOPSY Left 02/15/2017  ? Procedure: LEFT MASTECTOMY;  Surgeon: Stark Klein, MD;   Location: [place];  Service: General;  Laterality: Left;  ? TOTAL MASTECTOMY Right 12/26/2018  ? Procedure: RIGHT BREAST PROPHYLATIC MASTECTOMY;  Surgeon: Stark Klein, MD;  Location: [place];  Service: General;  Laterality: Right;  ? ?Past Surgical History:  ?Procedure Laterality Date  ? ANAL SPHINCTEROTOMY  04/2011  ? APPENDECTOMY  1980  ? AXILLARY LYMPH NODE DISSECTION Left 02/04/2013  ? Procedure: LEFT AXILLARY LYMPH NODE DISSECTION;  Surgeon: Stark Klein, MD;  [facility];  Service: General;  Laterality: Left;  End: 1512  ? BREAST LUMPECTOMY WITH NEEDLE LOCALIZATION Left 02/04/2013  ? Procedure: LEFT BREAST LUMPECTOMY WITH NEEDLE LOCALIZATION;  Surgeon: Stark Klein, MD;  Location: [place];  Service: General;  Laterality: Left;  ? BREAST SURGERY    ? Lumpectomy in april 2014  ? HEMORRHOID SURGERY  04/2011  ? ligation  ? MASTECTOMY Left 02/15/2017  ? PORT-A-CATH REMOVAL N/A 04/16/2014  ? Procedure: REMOVAL PORT-A-CATH;  Surgeon: Stark Klein, MD;  [facility];  Service: General;  Laterality: N/A;  ? PORTACATH PLACEMENT Right 02/04/2013  ? Procedure: INSERTION PORT-A-CATH;  Surgeon: Stark Klein, MD;  Location: [place];  Service: General;  Laterality: Right;  Start Time: 3354.  ? SHOULDER ARTHROSCOPY WITH ROTATOR CUFF REPAIR AND SUBACROMIAL DECOMPRESSION Left 02/24/2014  ? Procedure: SHOULDER ARTHROSCOPY WITH ROTATOR CUFF REPAIR AND SUBACROMIAL DECOMPRESSION;  Surgeon: Meredith Pel, MD;  Location: [place];  Service: Orthopedics;  Laterality: Left;  LEFT SHOULDER DIAGNOSTIC OPERATIVE ARTHROSCOPY, SUBACROMIAL DECOMPRESSION, ROTATOR CUFF TEAR REPAIR  ? SIMPLE MASTECTOMY WITH AXILLARY SENTINEL NODE BIOPSY Left 02/15/2017  ? Procedure: LEFT MASTECTOMY;  Surgeon: Stark Klein, MD;  Location: [place];  Service: General;  Laterality: Left;  ? TOTAL MASTECTOMY Right 12/26/2018  ? Procedure: RIGHT BREAST PROPHYLATIC MASTECTOMY;  Surgeon: Stark Klein, MD;  Location: [place];  Service: General;  Laterality: Right;  ? ?Past Medical  History:  ?Diagnosis Date  ? Abnormal gait 12/17/2019  ? Acute blood loss anemia   ? Anal fissure 05/03/2011  ? Anal pain 04/27/2021  ? Anemia   ? Iron deficinecy anemia  ? Anemia 05/14/2017  ? Anxiety and depres

## 2022-01-30 ENCOUNTER — Encounter: Payer: Self-pay | Admitting: Registered Nurse

## 2022-01-31 ENCOUNTER — Telehealth: Payer: Self-pay | Admitting: *Deleted

## 2022-01-31 NOTE — Telephone Encounter (Signed)
Prior auth submitted for Nucynta ot [employer] via CoverMyMeds. ?

## 2022-02-01 NOTE — Telephone Encounter (Signed)
Denied.

## 2022-02-07 ENCOUNTER — Telehealth: Payer: Self-pay | Admitting: Registered Nurse

## 2022-02-07 MED ORDER — HYDROCODONE-ACETAMINOPHEN 10-325 MG PO TABS: 1.0000 | ORAL_TABLET | Freq: Four times a day (QID) | ORAL | 0 refills | Status: DC | PRN

## 2022-02-07 NOTE — Telephone Encounter (Signed)
error 

## 2022-02-14 ENCOUNTER — Telehealth: Payer: Self-pay | Admitting: Registered Nurse

## 2022-02-14 NOTE — Telephone Encounter (Signed)
Appeal for prior auth for Nucynta ER. Approval 01/30/22 through 11/05/22. ?

## 2022-02-14 NOTE — Telephone Encounter (Signed)
Placed a call to Ms. Meetze: The Nucynta was approved : through December 31.2023.  ?

## 2022-02-14 NOTE — Telephone Encounter (Signed)
Reviewed with Kristi [name] how to take her Nucynta and when to use the Hydrocodone for breakthrough pain. She will send a My-Chart message on Friday with a update, she verbalizes understanding.  ?

## 2022-02-15 ENCOUNTER — Telehealth: Payer: Self-pay | Admitting: Registered Nurse

## 2022-02-15 ENCOUNTER — Telehealth: Payer: Self-pay | Admitting: *Deleted

## 2022-02-15 DIAGNOSIS — G620 Drug-induced polyneuropathy: Secondary | ICD-10-CM

## 2022-02-15 MED ORDER — TAPENTADOL HCL 50 MG PO TABS: 50.0000 mg | ORAL_TABLET | Freq: Two times a day (BID) | ORAL | 0 refills | Status: DC | PRN

## 2022-02-15 NOTE — Telephone Encounter (Signed)
Placed a call to  Kristi [name], regarding the Kristi [name]. Kristi [name] co-pay is 100 dollars. Kristi [name] was told by one of the [facility] representative her co-pay would be $40.00. Spoke with representative and she stated Kristi [name] plan doesn't cover Nucynta ER, just the IR and her co-pay will be 100 dollars. Placed a call to Kristi [name] regarding the above, we discussed Tramadol for break-through and / or Gabapentin dose of 300 mg at supper. Will discuss with Kristi [name] regarding the above. Labs reviewed, last CMP > 1 year ago. CMP ordered today, awaiting results prior to making any medication changes. Kristi [name] verbalizes understanding.  ?

## 2022-02-15 NOTE — Telephone Encounter (Signed)
PMP was Reviewed  ?Her insurance approved Nucynta IR, Nucynta ER d/C'd.  ?Ms. Pincock is aware of the above, Technical sales engineer spoke with Ms. Depaola regarding the above.  ?

## 2022-02-15 NOTE — Telephone Encounter (Signed)
Verified with [employer] that it was Nuncynta IR #[id number] that was approved, not the ER. I have spoken with the pharmacy and let them know that we will send new rx over for the nucynta IR and to cancel the ER Rx. Kristi [name] has been informed. ?

## 2022-02-16 DIAGNOSIS — G620 Drug-induced polyneuropathy: Secondary | ICD-10-CM | POA: Diagnosis not present

## 2022-02-16 DIAGNOSIS — T451X5A Adverse effect of antineoplastic and immunosuppressive drugs, initial encounter: Secondary | ICD-10-CM | POA: Diagnosis not present

## 2022-02-17 ENCOUNTER — Telehealth: Payer: Self-pay | Admitting: Registered Nurse

## 2022-02-17 LAB — COMPREHENSIVE METABOLIC PANEL
ALT: 21 IU/L (ref 0–32)
AST: 32 IU/L (ref 0–40)
Albumin/Globulin Ratio: 1.7 (ref 1.2–2.2)
Albumin: 4.3 g/dL (ref 3.8–4.8)
Alkaline Phosphatase: 113 IU/L (ref 44–121)
BUN/Creatinine Ratio: 11 — ABNORMAL LOW (ref 12–28)
BUN: 12 mg/dL (ref 8–27)
Bilirubin Total: 0.5 mg/dL (ref 0.0–1.2)
CO2: 24 mmol/L (ref 20–29)
Calcium: 10.5 mg/dL — ABNORMAL HIGH (ref 8.7–10.3)
Chloride: 105 mmol/L (ref 96–106)
Creatinine, Ser: 1.1 mg/dL — ABNORMAL HIGH (ref 0.57–1.00)
Globulin, Total: 2.5 g/dL (ref 1.5–4.5)
Glucose: 148 mg/dL — ABNORMAL HIGH (ref 70–99)
Potassium: 4.9 mmol/L (ref 3.5–5.2)
Sodium: 141 mmol/L (ref 134–144)
Total Protein: 6.8 g/dL (ref 6.0–8.5)
eGFR: 55 mL/min/{1.73_m2} — ABNORMAL LOW (ref >59)

## 2022-02-17 NOTE — Telephone Encounter (Signed)
Dr Ranell Patrick,  ?I ordered a CMP on Kristi [name].  ?Her GFR is 55 ?According to calculator her urine clearance is 62.  ?As you know this is a estimate, based on up to date her Gabapentin dose should be max of 1800 mg, she is currently on 2100 mg. I'm not sure if you want to refer her to nephrology or have her PCP obtain a 24 hr urine clearance.  ?I wanted to increase her gabapentin dose, but due to her last GFR, I ordered the CMP.  ?I will await your input.  ?

## 2022-02-20 ENCOUNTER — Telehealth: Payer: Self-pay | Admitting: Registered Nurse

## 2022-02-20 NOTE — Telephone Encounter (Signed)
Dr Ranell Patrick,  ?I sent you a message on 02/17/2022, awaiting on your response. Due to her GFR, results.  ?

## 2022-02-23 ENCOUNTER — Encounter: Payer: Medicare Other | Attending: Physical Medicine and Rehabilitation | Admitting: Registered Nurse

## 2022-02-23 VITALS — BP 147/79 | HR 56 | Ht 62.0 in | Wt 158.0 lb

## 2022-02-23 DIAGNOSIS — Z79891 Long term (current) use of opiate analgesic: Secondary | ICD-10-CM | POA: Insufficient documentation

## 2022-02-23 DIAGNOSIS — G620 Drug-induced polyneuropathy: Secondary | ICD-10-CM | POA: Diagnosis not present

## 2022-02-23 DIAGNOSIS — Z5181 Encounter for therapeutic drug level monitoring: Secondary | ICD-10-CM | POA: Diagnosis not present

## 2022-02-23 DIAGNOSIS — G894 Chronic pain syndrome: Secondary | ICD-10-CM | POA: Insufficient documentation

## 2022-02-23 DIAGNOSIS — T451X5A Adverse effect of antineoplastic and immunosuppressive drugs, initial encounter: Secondary | ICD-10-CM | POA: Diagnosis not present

## 2022-02-23 DIAGNOSIS — M2550 Pain in unspecified joint: Secondary | ICD-10-CM | POA: Diagnosis not present

## 2022-02-23 MED ORDER — HYDROCODONE-ACETAMINOPHEN 10-325 MG PO TABS: 1.0000 | ORAL_TABLET | Freq: Four times a day (QID) | ORAL | 0 refills | Status: DC | PRN

## 2022-02-23 MED ORDER — TRAMADOL HCL 50 MG PO TABS: 50.0000 mg | ORAL_TABLET | Freq: Two times a day (BID) | ORAL | 1 refills | Status: DC | PRN

## 2022-02-23 NOTE — Progress Notes (Signed)
? ?Subjective:  ? ? Patient ID: Kristi [name], female    DOB: 05-10-1956, 66 y.o.   MRN: [id number] ? ?HPI: Kristi [name] is a 66 y.o. female who returns for follow up appointment for chronic pain and medication refill. She states her pain is located in her bilateral feet with tingling and burning. Kristi [name] reports increase intensity of neuropathic pain, medication not controlling her pain, we discussed Tramadol last month, the above was discussed with Dr Ranell Patrick. Dr Ranell Patrick is in agreement with Tramadol for break through pain. Kristi [name] was approved, due to cost, she wasn't able to fill medication. She rates her pain 7. Her current exercise regime is walking and performing stretching exercises. ? ?Kristi [name] Morphine equivalent is 40.00 MME.   Last UDS was Performed on 12/29/2021, it was consistent.  ?  ? ?Pain Inventory ?Average Pain 7 ?Pain Right Now 7 ?My pain is sharp, burning, stabbing, and tingling ? ?In the last 24 hours, has pain interfered with the following? ?General activity 5 ?Relation with others 5 ?Enjoyment of life 4 ?What TIME of day is your pain at its worst? morning , daytime, evening, and night ?Sleep (in general) Fair ? ?Pain is worse with: walking, sitting, inactivity, and standing ?Pain improves with: medication ?Relief from Meds: 5 ? ?Family History  ?Problem Relation Age of Onset  ? Lung cancer Father   ? Hypertension Father   ? Thyroid cancer Father   ?     dx in his 54s  ? Cancer Father   ?     lung, thyroid, smoker  ? Breast cancer Paternal Aunt 25  ? Colon cancer Paternal Aunt   ?     dx in her 50x  ? Cervical cancer Paternal Aunt   ?     dzx in her 72s  ? Ovarian cancer Cousin   ?     dx in her lage 74s  ? Breast cancer Cousin   ?     maternal first cousin, once removed; dx in her late 24s  ? Breast cancer Cousin   ?     maternal first cousin once removed; dx in late 74s  ? Hypertension Mother   ? Diabetes Mother   ? Dementia Mother   ? Memory loss Mother   ? Hypertension  Brother   ? Seizures Brother   ?     Alcohol induced.  ? Alcohol abuse Brother   ?     drinker, smoker  ? COPD Brother   ? Cancer Paternal Uncle   ?     oral cancer  ? Kidney cancer Paternal Grandmother   ? Arthritis Daughter   ?     back surgery  ? Cancer Cousin   ?     several paternal cousins with brain cancer, leukemia, and other cancers  ? Cancer Sister   ?     stomach  ? ?Social History  ? ?Socioeconomic History  ? Marital status: Married  ?  Spouse name: [name]  ? Number of children: 1  ? Years of education: 77  ? Highest education level: Not on file  ?Occupational History  ? Occupation: INVENTORY SPECIALIST   ?  Employer: [employer]  ? Occupation: disability no longer working 2015  ?Tobacco Use  ? Smoking status: Never  ? Smokeless tobacco: Never  ?Vaping Use  ? Vaping Use: Never used  ?Substance and Sexual Activity  ? Alcohol use: No  ? Drug  use: No  ? Sexual activity: Yes  ?  Comment: lives with husband, disability/retirement. [employer], no dietary restrictions  ?Other Topics Concern  ? Not on file  ?Social History Narrative  ? Patient is married [name]) and lives at home with her husband.  ? Patient has one daughter  ? Patient drinks very little caffeine.  ? Left handed  ? ?Social Determinants of Health  ? ?Financial Resource Strain: Low Risk   ? Difficulty of Paying Living Expenses: Not hard at all  ?Food Insecurity: No Food Insecurity  ? Worried About Charity fundraiser in the Last Year: Never true  ? Ran Out of Food in the Last Year: Never true  ?Transportation Needs: No Transportation Needs  ? Lack of Transportation (Medical): No  ? Lack of Transportation (Non-Medical): No  ?Physical Activity: Sufficiently Active  ? Days of Exercise per Week: 7 days  ? Minutes of Exercise per Session: 60 min  ?Stress: No Stress Concern Present  ? Feeling of Stress : Not at all  ?Social Connections: Moderately Integrated  ? Frequency of Communication with Friends and Family: More than three times a  week  ? Frequency of Social Gatherings with Friends and Family: More than three times a week  ? Attends Religious Services: More than 4 times per year  ? Active Member of Clubs or Organizations: No  ? Attends Archivist Meetings: Never  ? Marital Status: Married  ? ?Past Surgical History:  ?Procedure Laterality Date  ? ANAL SPHINCTEROTOMY  04/2011  ? APPENDECTOMY  1980  ? AXILLARY LYMPH NODE DISSECTION Left 02/04/2013  ? Procedure: LEFT AXILLARY LYMPH NODE DISSECTION;  Surgeon: Stark Klein, MD;  Location: [facility];  Service: General;  Laterality: Left;  End: 0867  ? BREAST LUMPECTOMY WITH NEEDLE LOCALIZATION Left 02/04/2013  ? Procedure: LEFT BREAST LUMPECTOMY WITH NEEDLE LOCALIZATION;  Surgeon: Stark Klein, MD;  Location: [facility];  Service: General;  Laterality: Left;  ? BREAST SURGERY    ? Lumpectomy in april 2014  ? HEMORRHOID SURGERY  04/2011  ? ligation  ? MASTECTOMY Left 02/15/2017  ? PORT-A-CATH REMOVAL N/A 04/16/2014  ? Procedure: REMOVAL PORT-A-CATH;  Surgeon: Stark Klein, MD;  Location: [facility];  Service: General;  Laterality: N/A;  ? PORTACATH PLACEMENT Right 02/04/2013  ? Procedure: INSERTION PORT-A-CATH;  Surgeon: Stark Klein, MD;  Location: [facility];  Service: General;  Laterality: Right;  Start Time: 6195.  ? SHOULDER ARTHROSCOPY WITH ROTATOR CUFF REPAIR AND SUBACROMIAL DECOMPRESSION Left 02/24/2014  ? Procedure: SHOULDER ARTHROSCOPY WITH ROTATOR CUFF REPAIR AND SUBACROMIAL DECOMPRESSION;  Surgeon: Meredith Pel, MD;  Location: [place] [place];  Service: Orthopedics;  Laterality: Left;  LEFT SHOULDER DIAGNOSTIC OPERATIVE ARTHROSCOPY, SUBACROMIAL DECOMPRESSION, ROTATOR CUFF TEAR REPAIR  ? SIMPLE MASTECTOMY WITH AXILLARY SENTINEL NODE BIOPSY Left 02/15/2017  ? Procedure: LEFT MASTECTOMY;  Surgeon: Stark Klein, MD;  Location: [facility];  Service: General;  Laterality: Left;  ? TOTAL MASTECTOMY Right 12/26/2018  ? Procedure: RIGHT BREAST PROPHYLATIC MASTECTOMY;  Surgeon: Stark Klein, MD;  Location: [facility];  Service:  General;  Laterality: Right;  ? ?Past Surgical History:  ?Procedure Laterality Date  ? ANAL SPHINCTEROTOMY  04/2011  ? APPENDECTOMY  1980  ? AXILLARY LYMPH NODE DISSECTION Left 02/04/2013  ? Procedure: LEFT AXILLARY LYMPH NODE DISSECTION;  Surgeon: Stark Klein, MD;  Location: [facility];  Service: General;  Laterality: Left;  End: 0932  ? BREAST LUMPECTOMY WITH NEEDLE LOCALIZATION Left 02/04/2013  ? Procedure: LEFT BREAST LUMPECTOMY  WITH NEEDLE LOCALIZATION;  Surgeon: Stark Klein, MD;  Location: [facility];  Service: General;  Laterality: Left;  ? BREAST SURGERY    ? Lumpectomy in april 2014  ? HEMORRHOID SURGERY  04/2011  ? ligation  ? MASTECTOMY Left 02/15/2017  ? PORT-A-CATH REMOVAL N/A 04/16/2014  ? Procedure: REMOVAL PORT-A-CATH;  Surgeon: Stark Klein, MD;  Location: [facility];  Service: General;  Laterality: N/A;  ? PORTACATH PLACEMENT Right 02/04/2013  ? Procedure: INSERTION PORT-A-CATH;  Surgeon: Stark Klein, MD;  Location: [facility];  Service: General;  Laterality: Right;  Start Time: 6811.  ? SHOULDER ARTHROSCOPY WITH ROTATOR CUFF REPAIR AND SUBACROMIAL DECOMPRESSION Left 02/24/2014  ? Procedure: SHOULDER ARTHROSCOPY WITH ROTATOR CUFF REPAIR AND SUBACROMIAL DECOMPRESSION;  Surgeon: Meredith Pel, MD;  Location: [facility];  Service: Orthopedics;  Laterality: Left;  LEFT SHOULDER DIAGNOSTIC OPERATIVE ARTHROSCOPY, SUBACROMIAL DECOMPRESSION, ROTATOR CUFF TEAR REPAIR  ? SIMPLE MASTECTOMY WITH AXILLARY SENTINEL NODE BIOPSY Left 02/15/2017  ? Procedure: LEFT MASTECTOMY;  Surgeon: Stark Klein, MD;  Location: [place];  Service: General;  Laterality: Left;  ? TOTAL MASTECTOMY Right 12/26/2018  ? Procedure: RIGHT BREAST PROPHYLATIC MASTECTOMY;  Surgeon: Stark Klein, MD;  Location: [place] [facility];  Service: General;  Laterality: Right;  ? ?Past Medical History:  ?Diagnosis Date  ? Abnormal gait 12/17/2019  ? Acute blood loss anemia   ? Anal fissure 05/03/2011  ? Anal pain 04/27/2021  ? Anemia   ? Iron deficinecy anemia  ? Anemia 05/14/2017  ? Anxiety  and depression 05/15/2014  ? Arthritis   ? Body mass index (BMI) 30.0-30.9, adult 12/17/2019  ? Breast cancer ([place])   ? left ,last radiation 2'15, last chemo 8'14  ? Cervical cancer screening 06/18/2018

## 2022-02-24 ENCOUNTER — Encounter: Payer: Self-pay | Admitting: Registered Nurse

## 2022-03-22 ENCOUNTER — Other Ambulatory Visit: Payer: Self-pay | Admitting: Physical Medicine and Rehabilitation

## 2022-03-22 MED ORDER — HYDROCODONE-ACETAMINOPHEN 10-325 MG PO TABS: 1.0000 | ORAL_TABLET | Freq: Four times a day (QID) | ORAL | 0 refills | Status: DC | PRN

## 2022-03-28 ENCOUNTER — Encounter: Payer: Self-pay | Admitting: Family Medicine

## 2022-03-28 ENCOUNTER — Ambulatory Visit (INDEPENDENT_AMBULATORY_CARE_PROVIDER_SITE_OTHER): Payer: Medicare Other | Admitting: Family Medicine

## 2022-03-28 VITALS — BP 134/78 | HR 60 | Resp 20 | Ht 62.0 in | Wt 157.4 lb

## 2022-03-28 DIAGNOSIS — R739 Hyperglycemia, unspecified: Secondary | ICD-10-CM

## 2022-03-28 DIAGNOSIS — I10 Essential (primary) hypertension: Secondary | ICD-10-CM

## 2022-03-28 DIAGNOSIS — Z170 Estrogen receptor positive status [ER+]: Secondary | ICD-10-CM | POA: Diagnosis not present

## 2022-03-28 DIAGNOSIS — Z0000 Encounter for general adult medical examination without abnormal findings: Secondary | ICD-10-CM | POA: Diagnosis not present

## 2022-03-28 DIAGNOSIS — E782 Mixed hyperlipidemia: Secondary | ICD-10-CM | POA: Diagnosis not present

## 2022-03-28 DIAGNOSIS — C50212 Malignant neoplasm of upper-inner quadrant of left female breast: Secondary | ICD-10-CM

## 2022-03-28 NOTE — Assessment & Plan Note (Signed)
Encourage heart healthy diet such as MIND or DASH diet, increase exercise, avoid trans fats, simple carbohydrates and processed foods, consider a krill or fish or flaxseed oil cap daily.  °

## 2022-03-28 NOTE — Patient Instructions (Signed)
Shingrix is the new shingles shot, 2 shots over 2-6 months, confirm coverage with insurance and document, then can return here for shots with nurse appt or at pharmacy   Prevnar 20 is the new pneumonia shot to consider  Make sure to take a Tetanus shot if you ever have an injury Preventive Care 65 Years and Older, Female Preventive care refers to lifestyle choices and visits with your health care provider that can promote health and wellness. Preventive care visits are also called wellness exams. What can I expect for my preventive care visit? Counseling Your health care provider may ask you questions about your: Medical history, including: Past medical problems. Family medical history. Pregnancy and menstrual history. History of falls. Current health, including: Memory and ability to understand (cognition). Emotional well-being. Home life and relationship well-being. Sexual activity and sexual health. Lifestyle, including: Alcohol, nicotine or tobacco, and drug use. Access to firearms. Diet, exercise, and sleep habits. Work and work Statistician. Sunscreen use. Safety issues such as seatbelt and bike helmet use. Physical exam Your health care provider will check your: Height and weight. These may be used to calculate your BMI (body mass index). BMI is a measurement that tells if you are at a healthy weight. Waist circumference. This measures the distance around your waistline. This measurement also tells if you are at a healthy weight and may help predict your risk of certain diseases, such as type 2 diabetes and high blood pressure. Heart rate and blood pressure. Body temperature. Skin for abnormal spots. What immunizations do I need?  Vaccines are usually given at various ages, according to a schedule. Your health care provider will recommend vaccines for you based on your age, medical history, and lifestyle or other factors, such as travel or where you work. What tests do I  need? Screening Your health care provider may recommend screening tests for certain conditions. This may include: Lipid and cholesterol levels. Hepatitis C test. Hepatitis B test. HIV (human immunodeficiency virus) test. STI (sexually transmitted infection) testing, if you are at risk. Lung cancer screening. Colorectal cancer screening. Diabetes screening. This is done by checking your blood sugar (glucose) after you have not eaten for a while (fasting). Mammogram. Talk with your health care provider about how often you should have regular mammograms. BRCA-related cancer screening. This may be done if you have a family history of breast, ovarian, tubal, or peritoneal cancers. Bone density scan. This is done to screen for osteoporosis. Talk with your health care provider about your test results, treatment options, and if necessary, the need for more tests. Follow these instructions at home: Eating and drinking  Eat a diet that includes fresh fruits and vegetables, whole grains, lean protein, and low-fat dairy products. Limit your intake of foods with high amounts of sugar, saturated fats, and salt. Take vitamin and mineral supplements as recommended by your health care provider. Do not drink alcohol if your health care provider tells you not to drink. If you drink alcohol: Limit how much you have to 0-1 drink a day. Know how much alcohol is in your drink. In the [place] one drink equals one 12 oz bottle of beer (355 mL), one 5 oz glass of wine (148 mL), or one 1 oz glass of hard liquor (44 mL). Lifestyle Brush your teeth every morning and night with fluoride toothpaste. Floss one time each day. Exercise for at least 30 minutes 5 or more days each week. Do not use any products that contain nicotine or  tobacco. These products include cigarettes, chewing tobacco, and vaping devices, such as e-cigarettes. If you need help quitting, ask your health care provider. Do not use drugs. If you are  sexually active, practice safe sex. Use a condom or other form of protection in order to prevent STIs. Take aspirin only as told by your health care provider. Make sure that you understand how much to take and what form to take. Work with your health care provider to find out whether it is safe and beneficial for you to take aspirin daily. Ask your health care provider if you need to take a cholesterol-lowering medicine (statin). Find healthy ways to manage stress, such as: Meditation, yoga, or listening to music. Journaling. Talking to a trusted person. Spending time with friends and family. Minimize exposure to UV radiation to reduce your risk of skin cancer. Safety Always wear your seat belt while driving or riding in a vehicle. Do not drive: If you have been drinking alcohol. Do not ride with someone who has been drinking. When you are tired or distracted. While texting. If you have been using any mind-altering substances or drugs. Wear a helmet and other protective equipment during sports activities. If you have firearms in your house, make sure you follow all gun safety procedures. What's next? Visit your health care provider once a year for an annual wellness visit. Ask your health care provider how often you should have your eyes and teeth checked. Stay up to date on all vaccines. This information is not intended to replace advice given to you by your health care provider. Make sure you discuss any questions you have with your health care provider. Document Revised: 04/20/2021 Document Reviewed: 04/20/2021 Elsevier Patient Education  [employer].

## 2022-03-28 NOTE — Assessment & Plan Note (Signed)
Patient encouraged to maintain heart healthy diet, regular exercise, adequate sleep. Consider daily probiotics. Take medications as prescribed. Labs ordered and reviewed. Dexa 2022, normal repeat in 2027. MM 2020. Colonoscopy 2016, told to repeat in 5 years. Given documents for and discussed need for ACP to be completed. Patient agrees will let us know if she has any further questions

## 2022-03-28 NOTE — Progress Notes (Unsigned)
Subjective:    Patient ID: Kristi [name], female    DOB: 1956-09-09, 66 y.o.   MRN: [id number]  Chief Complaint  Patient presents with   Annual Exam    HPI Patient is in today for her annual physical exam.  Past Medical History:  Diagnosis Date   Abnormal gait 12/17/2019   Acute blood loss anemia    Anal fissure 05/03/2011   Anal pain 04/27/2021   Anemia    Iron deficinecy anemia   Anemia 05/14/2017   Anxiety and depression 05/15/2014   Arthritis    Body mass index (BMI) 30.0-30.9, adult 12/17/2019   Breast cancer ([place])    left ,last radiation 2'15, last chemo 8'14   Cervical cancer screening 06/18/2018   Menarche at 11 Regular and moderate flow  history of abnormal pap in past, 1 mild abnormality years ago that resolved spontaneously on recheck G1P1, s/p 1 svd history of abnormal MGM, b/o breast cancer 2014 No concerns today  gyn surgeries. Lumpectomy  LMP around early 2014   Cervical stenosis of spine 12/17/2019   Constipation 11/13/2016   Contracture of axilla 05/20/2013   Debility 01/18/2020   Essential (primary) hypertension 12/17/2019   Flushing 02/20/2014   Gait instability 12/17/2019   Genetic testing 02/20/2019   Negative genetic testing on the Comprehensive Cancer Panel.  The Comprehensive Common Cancer Panel offered by [facility] includes sequencing and/or deletion duplication testing of the following 46 genes: APC, ATM, AXIN2, BAP1, BARD1, BMPR1A, BRCA1, BRCA2, BRIP1, CDH1, CDK4, CDKN2A, CHEK2, EPCAM, FANCC, FH, FLCN, HOXB13, MET, MITF,  MLH1, MSH2, MSH6, MUTYH, NBN, NF1, NTHL1,  PALB2, PMS2, POLD1, POLE, P   H/O: CVA (cerebrovascular accident) 06/19/2020   Hereditary and idiopathic peripheral neuropathy 11/11/2013   Herpes zoster 05/15/2014   History of chicken pox    History of left breast cancer 02/15/2017   History of radiation therapy 09/09/13-10/28/13   45 gray to left breast, lumpectomy cavity boosted to 63 gray   HTN (hypertension) 11/13/2016    Hyperglycemia 01/09/2016   Hyperlipidemia 05/14/2017   Hypertension    Internal hemorrhoids without complication 5/32/9924   Knee pain, bilateral 11/14/2016   Left upper extremity numbness 06/19/2018   Major depressive disorder, recurrent episode, severe ([place]) 01/12/2014   Malignant neoplasm of upper-inner quadrant of female breast ([place]) 01/24/2013    Left IDC, ER 3%, PR-, Her2neu-  Formatting of this note might be different from the original. Overview:   Left IDC, ER 3%, PR-, Her2neu-  Last Assessment & Plan:  Pt will continue to take her tamoxifen as previously directed. She appears to be tolerating this with only c/o moderate, occ hot flashes.   Mild neurocognitive disorder due to multiple etiologies 03/17/2020   MRSA (methicillin resistant Staphylococcus aureus) 2009   right groin area-no issues now. 04-07-14 PCR screen negative today.   Neuropathy    Neuropathy of hand, left 09/21/2015   Nodule of finger of left hand 11/14/2016   Palpitations 07/30/2020   Personal history of colonic polyps 04/27/2021   Preventative health care 11/13/2016   Right hip pain 11/14/2016   S/P mastectomy, right 12/26/2018   Sebaceous cyst 04/12/2013   Shoulder joint pain 12/12/2013   Spinal stenosis in cervical region 10/09/2019   Strain of rotator cuff 02/24/2014   Stroke ([place]) 06/2020   Vertigo     Past Surgical History:  Procedure Laterality Date   ANAL SPHINCTEROTOMY  04/2011   APPENDECTOMY  1980   AXILLARY LYMPH NODE DISSECTION Left 02/04/2013  Procedure: LEFT AXILLARY LYMPH NODE DISSECTION;  Surgeon: Stark Klein, MD;  Location: [facility];  Service: General;  Laterality: Left;  End: 1610   BREAST LUMPECTOMY WITH NEEDLE LOCALIZATION Left 02/04/2013   Procedure: LEFT BREAST LUMPECTOMY WITH NEEDLE LOCALIZATION;  Surgeon: Stark Klein, MD;  Location: [facility];  Service: General;  Laterality: Left;   BREAST SURGERY     Lumpectomy in april 2014   [place]  04/2011   ligation   MASTECTOMY Left  02/15/2017   PORT-A-CATH REMOVAL N/A 04/16/2014   Procedure: REMOVAL PORT-A-CATH;  Surgeon: Stark Klein, MD;  Location: [facility];  Service: General;  Laterality: N/A;   PORTACATH PLACEMENT Right 02/04/2013   Procedure: INSERTION PORT-A-CATH;  Surgeon: Stark Klein, MD;  Location: [facility];  Service: General;  Laterality: Right;  Start Time: 9604.   SHOULDER ARTHROSCOPY WITH ROTATOR CUFF REPAIR AND SUBACROMIAL DECOMPRESSION Left 02/24/2014   Procedure: SHOULDER ARTHROSCOPY WITH ROTATOR CUFF REPAIR AND SUBACROMIAL DECOMPRESSION;  Surgeon: Meredith Pel, MD;  Location: [facility];  Service: Orthopedics;  Laterality: Left;  LEFT SHOULDER DIAGNOSTIC OPERATIVE ARTHROSCOPY, SUBACROMIAL DECOMPRESSION, ROTATOR CUFF TEAR REPAIR   SIMPLE MASTECTOMY WITH AXILLARY SENTINEL NODE BIOPSY Left 02/15/2017   Procedure: LEFT MASTECTOMY;  Surgeon: Stark Klein, MD;  Location: [facility];  Service: General;  Laterality: Left;   TOTAL MASTECTOMY Right 12/26/2018   Procedure: RIGHT BREAST PROPHYLATIC MASTECTOMY;  Surgeon: Stark Klein, MD;  Location: [facility];  Service: General;  Laterality: Right;    Family History  Problem Relation Age of Onset   Lung cancer Father    Hypertension Father    Thyroid cancer Father        dx in his 38s   Cancer Father        lung, thyroid, smoker   Breast cancer Paternal Aunt 31   Colon cancer Paternal Aunt        dx in her 50x   Cervical cancer Paternal Aunt        dzx in her 65s   Ovarian cancer Cousin        dx in her lage 41s   Breast cancer Cousin        maternal first cousin, once removed; dx in her late 80s   Breast cancer Cousin        maternal first cousin once removed; dx in late 49s   Hypertension Mother    Diabetes Mother    Dementia Mother    Memory loss Mother    Hypertension Brother    Seizures Brother        Alcohol induced.   Alcohol abuse Brother        drinker, smoker   COPD Brother    Cancer Paternal Uncle        oral cancer   Kidney cancer Paternal Grandmother     Arthritis Daughter        back surgery   Cancer Cousin        several paternal cousins with brain cancer, leukemia, and other cancers   Cancer Sister        stomach    Social History   Socioeconomic History   Marital status: Married    Spouse name: [name]   Number of children: 1   Years of education: 12   Highest education level: Not on file  Occupational History   Occupation: INVENTORY Hotel manager: [employer]   Occupation: disability no longer working 2015  Tobacco Use  Smoking status: Never   Smokeless tobacco: Never  Vaping Use   Vaping Use: Never used  Substance and Sexual Activity   Alcohol use: No   Drug use: No   Sexual activity: Yes    Comment: lives with husband, disability/retirement. [employer], no dietary restrictions  Other Topics Concern   Not on file  Social History Narrative   Patient is married [name]) and lives at home with her husband.   Patient has one daughter   Patient drinks very little caffeine.   Left handed   Social Determinants of Health   Financial Resource Strain: Low Risk    Difficulty of Paying Living Expenses: Not hard at all  Food Insecurity: No Food Insecurity   Worried About Charity fundraiser in the Last Year: Never true   Ran Out of Food in the Last Year: Never true  Transportation Needs: No Transportation Needs   Lack of Transportation (Medical): No   Lack of Transportation (Non-Medical): No  Physical Activity: Sufficiently Active   Days of Exercise per Week: 7 days   Minutes of Exercise per Session: 60 min  Stress: No Stress Concern Present   Feeling of Stress : Not at all  Social Connections: Moderately Integrated   Frequency of Communication with Friends and Family: More than three times a week   Frequency of Social Gatherings with Friends and Family: More than three times a week   Attends Religious Services: More than 4 times per year   Active Member of [employer] or Organizations: No    Attends Archivist Meetings: Never   Marital Status: Married  Human resources officer Violence: Not At Risk   Fear of Current or Ex-Partner: No   Emotionally Abused: No   Physically Abused: No   Sexually Abused: No    Outpatient Medications Prior to Visit  Medication Sig Dispense Refill   anastrozole (ARIMIDEX) 1 MG tablet Take 1 mg by mouth every morning.     Apoaequorin (PREVAGEN) 10 MG CAPS Take 1 capsule by mouth daily.     ARIPiprazole (ABILIFY) 5 MG tablet Take 5 mg by mouth at bedtime.     aspirin EC 325 MG tablet Take 1 tablet (325 mg total) by mouth daily. 30 tablet 0   atorvastatin (LIPITOR) 40 MG tablet TAKE 1 TABLET BY MOUTH EVERY DAY 90 tablet 1   gabapentin (NEURONTIN) 300 MG capsule Take 3 capsules (900 mg total) by mouth at bedtime. 90 capsule 3   gabapentin (NEURONTIN) 600 MG tablet Take 1 tablet (600 mg total) by mouth 2 (two) times daily. 60 tablet 3   HYDROcodone-acetaminophen (NORCO) 10-325 MG tablet Take 1 tablet by mouth 4 (four) times daily as needed. Do Not Fill Before 03/07/2022 120 tablet 0   hydrOXYzine (ATARAX/VISTARIL) 50 MG tablet Take 1 tablet (50 mg total) by mouth 2 (two) times daily. 30 tablet 0   Multiple Vitamin (MULTIVITAMIN WITH MINERALS) TABS tablet Take 1 tablet by mouth daily.     polyethylene glycol (MIRALAX / GLYCOLAX) 17 g packet Take 17 g by mouth daily as needed for mild constipation. 14 each 0   pyridOXINE (B-6) 50 MG tablet Take 50 mg by mouth daily.     traMADol (ULTRAM) 50 MG tablet Take 1 tablet (50 mg total) by mouth 2 (two) times daily as needed. Break through Pain 60 tablet 1   traZODone (DESYREL) 50 MG tablet Take 1 tablet (50 mg total) by mouth at bedtime. 30 tablet 0  Turmeric (QC TUMERIC COMPLEX PO) Take 1 tablet by mouth daily.     valsartan (DIOVAN) 160 MG tablet Take 1 tablet (160 mg total) by mouth daily. 90 tablet 3   prochlorperazine (COMPAZINE) 10 MG tablet Take 1 tablet (10 mg total) by mouth every 6 (six) hours as  needed (Nausea or vomiting). 30 tablet 1   prochlorperazine (COMPAZINE) 25 MG suppository Place 1 suppository (25 mg total) rectally every 12 (twelve) hours as needed for nausea. 12 suppository 3   No facility-administered medications prior to visit.    No Known Allergies  ROS     Objective:    Physical Exam  BP 134/78 (BP Location: Right Arm, Patient Position: Sitting, Cuff Size: Normal)   Pulse 60   Resp 20   Ht 5' 2"  (1.575 m)   Wt 157 lb 6.4 oz (71.4 kg)   LMP 01/20/2013   SpO2 98%   BMI 28.79 kg/m  Wt Readings from Last 3 Encounters:  03/28/22 157 lb 6.4 oz (71.4 kg)  02/23/22 158 lb (71.7 kg)  01/26/22 162 lb (73.5 kg)    Diabetic Foot Exam - Simple   No data filed    Lab Results  Component Value Date   WBC 5.8 03/08/2021   HGB 11.2 (L) 03/08/2021   HCT 34.7 (L) 03/08/2021   PLT 171.0 03/08/2021   GLUCOSE 148 (H) 02/16/2022   CHOL 115 03/08/2021   TRIG 62.0 03/08/2021   HDL 51.40 03/08/2021   LDLCALC 51 03/08/2021   ALT 21 02/16/2022   AST 32 02/16/2022   NA 141 02/16/2022   K 4.9 02/16/2022   CL 105 02/16/2022   CREATININE 1.10 (H) 02/16/2022   BUN 12 02/16/2022   CO2 24 02/16/2022   TSH 1.89 03/08/2021   INR 1.0 06/15/2020   HGBA1C 5.9 (H) 06/16/2020    Lab Results  Component Value Date   TSH 1.89 03/08/2021   Lab Results  Component Value Date   WBC 5.8 03/08/2021   HGB 11.2 (L) 03/08/2021   HCT 34.7 (L) 03/08/2021   MCV 86.0 03/08/2021   PLT 171.0 03/08/2021   Lab Results  Component Value Date   NA 141 02/16/2022   K 4.9 02/16/2022   CHLORIDE 104 01/22/2017   CO2 24 02/16/2022   GLUCOSE 148 (H) 02/16/2022   BUN 12 02/16/2022   CREATININE 1.10 (H) 02/16/2022   BILITOT 0.5 02/16/2022   ALKPHOS 113 02/16/2022   AST 32 02/16/2022   ALT 21 02/16/2022   PROT 6.8 02/16/2022   ALBUMIN 4.3 02/16/2022   CALCIUM 10.5 (H) 02/16/2022   ANIONGAP 11 06/21/2020   EGFR 55 (L) 02/16/2022   GFR 54.28 (L) 03/08/2021   Lab Results   Component Value Date   CHOL 115 03/08/2021   Lab Results  Component Value Date   HDL 51.40 03/08/2021   Lab Results  Component Value Date   LDLCALC 51 03/08/2021   Lab Results  Component Value Date   TRIG 62.0 03/08/2021   Lab Results  Component Value Date   CHOLHDL 2 03/08/2021   Lab Results  Component Value Date   HGBA1C 5.9 (H) 06/16/2020       Assessment & Plan:   Problem List Items Addressed This Visit       Cardiovascular and Mediastinum   HTN (hypertension)    Well controlled, no changes to meds. Encouraged heart healthy diet such as the DASH diet and exercise as tolerated.  Other   Hyperglycemia    hgba1c acceptable, minimize simple carbs. Increase exercise as tolerated.        Preventative health care    Patient encouraged to maintain heart healthy diet, regular exercise, adequate sleep. Consider daily probiotics. Take medications as prescribed. Labs ordered and reviewed. Dexa 2022, normal repeat in 2027. MM 2020. Colonoscopy 2016, told to repeat in 5 years.        Hyperlipidemia    Encourage heart healthy diet such as MIND or DASH diet, increase exercise, avoid trans fats, simple carbohydrates and processed foods, consider a krill or fish or flaxseed oil cap daily.         I have discontinued [name] "Theresa"'s prochlorperazine and prochlorperazine. I am also having her maintain her multivitamin with minerals, Prevagen, polyethylene glycol, traZODone, hydrOXYzine, Turmeric (QC TUMERIC COMPLEX PO), pyridOXINE, aspirin EC, valsartan, anastrozole, atorvastatin, gabapentin, gabapentin, ARIPiprazole, traMADol, and HYDROcodone-acetaminophen.  No orders of the defined types were placed in this encounter.

## 2022-03-28 NOTE — Assessment & Plan Note (Signed)
Well controlled, no changes to meds. Encouraged heart healthy diet such as the DASH diet and exercise as tolerated.  °

## 2022-03-28 NOTE — Assessment & Plan Note (Signed)
hgba1c acceptable, minimize simple carbs. Increase exercise as tolerated.  

## 2022-03-28 NOTE — Assessment & Plan Note (Signed)
S/p mastectomy

## 2022-03-29 LAB — CBC
HCT: 36.3 % (ref 36.0–46.0)
Hemoglobin: 11.6 g/dL — ABNORMAL LOW (ref 12.0–15.0)
MCHC: 32 g/dL (ref 30.0–36.0)
MCV: 87.6 fl (ref 78.0–100.0)
Platelets: 153 10*3/uL (ref 150.0–400.0)
RBC: 4.15 Mil/uL (ref 3.87–5.11)
RDW: 15.9 % — ABNORMAL HIGH (ref 11.5–15.5)
WBC: 5.6 10*3/uL (ref 4.0–10.5)

## 2022-03-29 LAB — COMPREHENSIVE METABOLIC PANEL
ALT: 21 U/L (ref 0–35)
AST: 24 U/L (ref 0–37)
Albumin: 4.3 g/dL (ref 3.5–5.2)
Alkaline Phosphatase: 105 U/L (ref 39–117)
BUN: 18 mg/dL (ref 6–23)
CO2: 30 mEq/L (ref 19–32)
Calcium: 10.3 mg/dL (ref 8.4–10.5)
Chloride: 105 mEq/L (ref 96–112)
Creatinine, Ser: 1.04 mg/dL (ref 0.40–1.20)
GFR: 55.75 mL/min — ABNORMAL LOW (ref >60.00)
Glucose, Bld: 89 mg/dL (ref 70–99)
Potassium: 4.9 mEq/L (ref 3.5–5.1)
Sodium: 142 mEq/L (ref 135–145)
Total Bilirubin: 0.5 mg/dL (ref 0.2–1.2)
Total Protein: 6.9 g/dL (ref 6.0–8.3)

## 2022-03-29 LAB — HEMOGLOBIN A1C: Hgb A1c MFr Bld: 5.9 % (ref 4.6–6.5)

## 2022-03-29 LAB — LIPID PANEL
Cholesterol: 115 mg/dL (ref 0–200)
HDL: 52.3 mg/dL (ref >39.00)
LDL Cholesterol: 49 mg/dL (ref 0–99)
NonHDL: 62.33
Total CHOL/HDL Ratio: 2
Triglycerides: 68 mg/dL (ref 0.0–149.0)
VLDL: 13.6 mg/dL (ref 0.0–40.0)

## 2022-03-29 LAB — TSH: TSH: 0.71 u[IU]/mL (ref 0.35–5.50)

## 2022-03-29 LAB — VITAMIN D 25 HYDROXY (VIT D DEFICIENCY, FRACTURES): VITD: 64.07 ng/mL (ref 30.00–100.00)

## 2022-03-30 ENCOUNTER — Encounter: Payer: Medicare Other | Attending: Physical Medicine and Rehabilitation | Admitting: Registered Nurse

## 2022-03-30 ENCOUNTER — Encounter: Payer: Self-pay | Admitting: Registered Nurse

## 2022-03-30 VITALS — BP 128/77 | HR 65 | Ht 62.0 in | Wt 157.0 lb

## 2022-03-30 DIAGNOSIS — T451X5A Adverse effect of antineoplastic and immunosuppressive drugs, initial encounter: Secondary | ICD-10-CM | POA: Insufficient documentation

## 2022-03-30 DIAGNOSIS — Z79891 Long term (current) use of opiate analgesic: Secondary | ICD-10-CM

## 2022-03-30 DIAGNOSIS — M2550 Pain in unspecified joint: Secondary | ICD-10-CM

## 2022-03-30 DIAGNOSIS — G894 Chronic pain syndrome: Secondary | ICD-10-CM | POA: Diagnosis not present

## 2022-03-30 DIAGNOSIS — Z5181 Encounter for therapeutic drug level monitoring: Secondary | ICD-10-CM | POA: Insufficient documentation

## 2022-03-30 DIAGNOSIS — G620 Drug-induced polyneuropathy: Secondary | ICD-10-CM | POA: Diagnosis not present

## 2022-03-30 MED ORDER — TRAMADOL HCL 50 MG PO TABS: 50.0000 mg | ORAL_TABLET | Freq: Two times a day (BID) | ORAL | 1 refills | Status: DC | PRN

## 2022-03-30 MED ORDER — GABAPENTIN 600 MG PO TABS: 600.0000 mg | ORAL_TABLET | Freq: Two times a day (BID) | ORAL | 3 refills | Status: DC

## 2022-03-30 MED ORDER — GABAPENTIN 300 MG PO CAPS: 900.0000 mg | ORAL_CAPSULE | Freq: Every day | ORAL | 3 refills | Status: DC

## 2022-03-30 MED ORDER — HYDROCODONE-ACETAMINOPHEN 10-325 MG PO TABS: 1.0000 | ORAL_TABLET | Freq: Four times a day (QID) | ORAL | 0 refills | Status: DC | PRN

## 2022-03-30 NOTE — Progress Notes (Signed)
Subjective:    Patient ID: Kristi [name], female    DOB: 1956-10-03, 66 y.o.   MRN: [id number]  HPI: Kristi [name] is a 66 y.o. female who returns for follow up appointment for chronic pain and medication refill. She states her pain is located in her bilateral feet with tingling and burning. She rates her pain 5. Her current exercise regime is walking and performing stretching exercises.  Kristi [name] Morphine equivalent is 40.00 MME.   Last UDS was Performed on 12/29/2021, it was consistent.      Pain Inventory Average Pain 5 Pain Right Now 5 My pain is intermittent, constant, sharp, burning, stabbing, tingling, and aching  In the last 24 hours, has pain interfered with the following? General activity 2 Relation with others 2 Enjoyment of life 2 What TIME of day is your pain at its worst? morning , daytime, evening, and night Sleep (in general) Good  Pain is worse with: walking, sitting, inactivity, and standing Pain improves with: rest and medication Relief from Meds: 6  Family History  Problem Relation Age of Onset   Lung cancer Father    Hypertension Father    Thyroid cancer Father        dx in his 27s   Cancer Father        lung, thyroid, smoker   Breast cancer Paternal Aunt 53   Colon cancer Paternal Aunt        dx in her 50x   Cervical cancer Paternal Aunt        dzx in her 29s   Ovarian cancer Cousin        dx in her lage 13s   Breast cancer Cousin        maternal first cousin, once removed; dx in her late 29s   Breast cancer Cousin        maternal first cousin once removed; dx in late 20s   Hypertension Mother    Diabetes Mother    Dementia Mother    Memory loss Mother    Hypertension Brother    Seizures Brother        Alcohol induced.   Alcohol abuse Brother        drinker, smoker   COPD Brother    Cancer Paternal Uncle        oral cancer   Kidney cancer Paternal Grandmother    Arthritis Daughter        back surgery   Cancer Cousin         several paternal cousins with brain cancer, leukemia, and other cancers   Cancer Sister        stomach   Social History   Socioeconomic History   Marital status: Married    Spouse name: Kristi [name]   Number of children: 1   Years of education: 12   Highest education level: Not on file  Occupational History   Occupation: INVENTORY Hotel manager: [employer]   Occupation: disability no longer working 2015  Tobacco Use   Smoking status: Never   Smokeless tobacco: Never  Vaping Use   Vaping Use: Never used  Substance and Sexual Activity   Alcohol use: No   Drug use: No   Sexual activity: Yes    Comment: lives with husband, disability/retirement. [employer], no dietary restrictions  Other Topics Concern   Not on file  Social History Narrative   Patient is married Kristi [name]) and lives at home  with her husband.   Patient has one daughter   Patient drinks very little caffeine.   Left handed   Social Determinants of Health   Financial Resource Strain: Low Risk    Difficulty of Paying Living Expenses: Not hard at all  Food Insecurity: No Food Insecurity   Worried About Charity fundraiser in the Last Year: Never true   Ran Out of Food in the Last Year: Never true  Transportation Needs: No Transportation Needs   Lack of Transportation (Medical): No   Lack of Transportation (Non-Medical): No  Physical Activity: Sufficiently Active   Days of Exercise per Week: 7 days   Minutes of Exercise per Session: 60 min  Stress: No Stress Concern Present   Feeling of Stress : Not at all  Social Connections: Moderately Integrated   Frequency of Communication with Friends and Family: More than three times a week   Frequency of Social Gatherings with Friends and Family: More than three times a week   Attends Religious Services: More than 4 times per year   Active Member of Clubs or Organizations: No   Attends Archivist Meetings: Never   Marital Status:  Married   Past Surgical History:  Procedure Laterality Date   ANAL SPHINCTEROTOMY  04/2011   [place] Left 02/04/2013   Procedure: LEFT AXILLARY LYMPH NODE DISSECTION;  Surgeon: Stark Klein, MD;  Location: [place];  Service: General;  Laterality: Left;  End: 1512   BREAST LUMPECTOMY WITH NEEDLE LOCALIZATION Left 02/04/2013   Procedure: LEFT BREAST LUMPECTOMY WITH NEEDLE LOCALIZATION;  Surgeon: Stark Klein, MD;  Location: [place];  Service: General;  Laterality: Left;   BREAST SURGERY     Lumpectomy in april 2014   [place]  04/2011   ligation   MASTECTOMY Left 02/15/2017   PORT-A-CATH REMOVAL N/A 04/16/2014   Procedure: REMOVAL PORT-A-CATH;  Surgeon: Stark Klein, MD;  Location: [facility];  Service: General;  Laterality: N/A;   PORTACATH PLACEMENT Right 02/04/2013   Procedure: INSERTION PORT-A-CATH;  Surgeon: Stark Klein, MD;  Location: [place];  Service: General;  Laterality: Right;  Start Time: 6314.   SHOULDER ARTHROSCOPY WITH ROTATOR CUFF REPAIR AND SUBACROMIAL DECOMPRESSION Left 02/24/2014   Procedure: SHOULDER ARTHROSCOPY WITH ROTATOR CUFF REPAIR AND SUBACROMIAL DECOMPRESSION;  Surgeon: Meredith Pel, MD;  Location: [place];  Service: Orthopedics;  Laterality: Left;  LEFT SHOULDER DIAGNOSTIC OPERATIVE ARTHROSCOPY, SUBACROMIAL DECOMPRESSION, ROTATOR CUFF TEAR REPAIR   SIMPLE MASTECTOMY WITH AXILLARY SENTINEL NODE BIOPSY Left 02/15/2017   Procedure: LEFT MASTECTOMY;  Surgeon: Stark Klein, MD;  Location: [place];  Service: General;  Laterality: Left;   TOTAL MASTECTOMY Right 12/26/2018   Procedure: RIGHT BREAST PROPHYLATIC MASTECTOMY;  Surgeon: Stark Klein, MD;  Location: [place];  Service: General;  Laterality: Right;   Past Surgical History:  Procedure Laterality Date   ANAL SPHINCTEROTOMY  04/2011   APPENDECTOMY  1980   AXILLARY LYMPH NODE DISSECTION Left 02/04/2013   Procedure: LEFT AXILLARY LYMPH NODE DISSECTION;  Surgeon: Stark Klein, MD;   Location: [place];  Service: General;  Laterality: Left;  End: 9702   BREAST LUMPECTOMY WITH NEEDLE LOCALIZATION Left 02/04/2013   Procedure: LEFT BREAST LUMPECTOMY WITH NEEDLE LOCALIZATION;  Surgeon: Stark Klein, MD;  Location: [place];  Service: General;  Laterality: Left;   BREAST SURGERY     Lumpectomy in april 2014   [place]  04/2011   ligation   MASTECTOMY Left 02/15/2017  PORT-A-CATH REMOVAL N/A 04/16/2014   Procedure: REMOVAL PORT-A-CATH;  Surgeon: Stark Klein, MD;  Location[facility];  Service: General;  Laterality: N/A;   PORTACATH PLACEMENT Right 02/04/2013   Procedure: INSERTION PORT-A-CATH;  Surgeon: Stark Klein, MD;  Location: [place];  Service: General;  Laterality: Right;  Start Time: 6213.   SHOULDER ARTHROSCOPY WITH ROTATOR CUFF REPAIR AND SUBACROMIAL DECOMPRESSION Left 02/24/2014   Procedure: SHOULDER ARTHROSCOPY WITH ROTATOR CUFF REPAIR AND SUBACROMIAL DECOMPRESSION;  Surgeon: Meredith Pel, MD;  Location: [place];  Service: Orthopedics;  Laterality: Left;  LEFT SHOULDER DIAGNOSTIC OPERATIVE ARTHROSCOPY, SUBACROMIAL DECOMPRESSION, ROTATOR CUFF TEAR REPAIR   SIMPLE MASTECTOMY WITH AXILLARY SENTINEL NODE BIOPSY Left 02/15/2017   Procedure: LEFT MASTECTOMY;  Surgeon: Stark Klein, MD;  Location: [place];  Service: General;  Laterality: Left;   TOTAL MASTECTOMY Right 12/26/2018   Procedure: RIGHT BREAST PROPHYLATIC MASTECTOMY;  Surgeon: Stark Klein, MD;  Location: [place];  Service: General;  Laterality: Right;   Past Medical History:  Diagnosis Date   Abnormal gait 12/17/2019   Acute blood loss anemia    Anal fissure 05/03/2011   Anal pain 04/27/2021   Anemia    Iron deficinecy anemia   Anemia 05/14/2017   Anxiety and depression 05/15/2014   Arthritis    Body mass index (BMI) 30.0-30.9, adult 12/17/2019   Breast cancer ([place])    left ,last radiation 2'15, last chemo 8'14   Cervical cancer screening 06/18/2018   Menarche at 11 Regular and moderate flow  history of  abnormal pap in past, 1 mild abnormality years ago that resolved spontaneously on recheck G1P1, s/p 1 svd history of abnormal MGM, b/o breast cancer 2014 No concerns today  gyn surgeries. Lumpectomy  LMP around early 2014   Cervical stenosis of spine 12/17/2019   Constipation 11/13/2016   Contracture of axilla 05/20/2013   Debility 01/18/2020   Essential (primary) hypertension 12/17/2019   Flushing 02/20/2014   Gait instability 12/17/2019   Genetic testing 02/20/2019   Negative genetic testing on the Comprehensive Cancer Panel.  The Comprehensive Common Cancer Panel offered by [facility] includes sequencing and/or deletion duplication testing of the following 46 genes: APC, ATM, AXIN2, BAP1, BARD1, BMPR1A, BRCA1, BRCA2, BRIP1, CDH1, CDK4, CDKN2A, CHEK2, EPCAM, FANCC, FH, FLCN, HOXB13, MET, MITF,  MLH1, MSH2, MSH6, MUTYH, NBN, NF1, NTHL1,  PALB2, PMS2, POLD1, POLE, P   H/O: CVA (cerebrovascular accident) 06/19/2020   Hereditary and idiopathic peripheral neuropathy 11/11/2013   Herpes zoster 05/15/2014   History of chicken pox    History of left breast cancer 02/15/2017   History of radiation therapy 09/09/13-10/28/13   45 gray to left breast, lumpectomy cavity boosted to 63 gray   HTN (hypertension) 11/13/2016   Hyperglycemia 01/09/2016   Hyperlipidemia 05/14/2017   Hypertension    Internal hemorrhoids without complication 0/86/5784   Knee pain, bilateral 11/14/2016   Left upper extremity numbness 06/19/2018   Major depressive disorder, recurrent episode, severe ([place]) 01/12/2014   Malignant neoplasm of upper-inner quadrant of female breast ([place]) 01/24/2013    Left IDC, ER 3%, PR-, Her2neu-  Formatting of this note might be different from the original. Overview:   Left IDC, ER 3%, PR-, Her2neu-  Last Assessment & Plan:  Pt will continue to take her tamoxifen as previously directed. She appears to be tolerating this with only c/o moderate, occ hot flashes.   Mild neurocognitive disorder due to  multiple etiologies 03/17/2020   MRSA (methicillin resistant Staphylococcus aureus) 2009   right groin  area-no issues now. 04-07-14 PCR screen negative today.   Neuropathy    Neuropathy of hand, left 09/21/2015   Nodule of finger of left hand 11/14/2016   Palpitations 07/30/2020   Personal history of colonic polyps 04/27/2021   Preventative health care 11/13/2016   Right hip pain 11/14/2016   S/P mastectomy, right 12/26/2018   Sebaceous cyst 04/12/2013   Shoulder joint pain 12/12/2013   Spinal stenosis in cervical region 10/09/2019   Strain of rotator cuff 02/24/2014   Stroke ([place]) 06/2020   Vertigo    LMP 01/20/2013   Opioid Risk Score:   Fall Risk Score:  `1  Depression screen [facility] 2/9     03/28/2022    2:36 PM 03/28/2022    2:35 PM 02/23/2022   11:35 AM 01/26/2022   11:31 AM 12/29/2021    2:15 PM 11/29/2021    2:41 PM 10/17/2021    1:48 PM  Depression screen PHQ 2/9  Decreased Interest 0 0 0 0 0 0 1  Down, Depressed, Hopeless 0 0 0 0 1 0 1  PHQ - 2 Score 0 0 0 0 1 0 2  Altered sleeping 0        Tired, decreased energy 0        Change in appetite 0        Feeling bad or failure about yourself  0        Trouble concentrating 0        Moving slowly or fidgety/restless 0        Suicidal thoughts 0        PHQ-9 Score 0        Difficult doing work/chores Not difficult at all          Review of Systems  Musculoskeletal:        PAIN IN BOTH FEET & LEFT HAND  All other systems reviewed and are negative.     Objective:   Physical Exam Vitals and nursing note reviewed.  Constitutional:      Appearance: Normal appearance.  Cardiovascular:     Rate and Rhythm: Normal rate and regular rhythm.     Pulses: Normal pulses.     Heart sounds: Normal heart sounds.  Pulmonary:     Effort: Pulmonary effort is normal.     Breath sounds: Normal breath sounds.  Musculoskeletal:     Cervical back: Normal range of motion and neck supple.     Comments: Normal Muscle Bulk and Muscle  Testing Reveals:  Upper Extremities: Full ROM and Muscle Strength 5/5  Lower Extremities Full ROM and Muscle Strength 5/5 Arises from Table with Ease Narrow Based Gait     Skin:    General: Skin is warm and dry.  Neurological:     Mental Status: She is alert and oriented to person, place, and time.  Psychiatric:        Mood and Affect: Mood normal.        Behavior: Behavior normal.         Assessment & Plan:  1. Chemotherapy Induced Peripheral Neuropathy: Continue current medication regimen with Gabapentin. Continue to Monitor. 03/30/2022 2. Chronic Pain Syndrome: Refilled: Tramadol 50 mg one tablet twice a day for breakthrough pain #60 and   Hydrocodone 10/325 mg one tablet 2-3 times a day as needed for pain. We will continue the opioid monitoring program, this consists of regular clinic visits, examinations, urine drug screen, pill counts as well as use of [place] Controlled  Substance Reporting system. A 12 month History has been reviewed on the [place] Controlled Substance Reporting System on 03/30/2022 3. Polyarthralgia: Continue HEP as Tolerated: Continue to Monitor. 03/30/2022   F/U in 1 month

## 2022-04-06 ENCOUNTER — Encounter: Payer: Self-pay | Admitting: Registered Nurse

## 2022-05-01 ENCOUNTER — Encounter: Payer: Self-pay | Admitting: Physical Medicine and Rehabilitation

## 2022-05-02 ENCOUNTER — Other Ambulatory Visit: Payer: Self-pay | Admitting: Physical Medicine and Rehabilitation

## 2022-05-02 MED ORDER — HYDROCODONE-ACETAMINOPHEN 10-325 MG PO TABS: 1.0000 | ORAL_TABLET | Freq: Four times a day (QID) | ORAL | 0 refills | Status: DC | PRN

## 2022-05-03 ENCOUNTER — Telehealth: Payer: Self-pay

## 2022-05-03 ENCOUNTER — Other Ambulatory Visit: Payer: Self-pay | Admitting: Physical Medicine and Rehabilitation

## 2022-05-03 MED ORDER — HYDROCODONE-ACETAMINOPHEN 10-325 MG PO TABS: 1.0000 | ORAL_TABLET | Freq: Four times a day (QID) | ORAL | 0 refills | Status: DC | PRN

## 2022-05-03 NOTE — Telephone Encounter (Signed)
Patient called and stated [facility] does not have the hydro[facility]one. She needs it sent to the [facility] in [place]. Prescription cancelled at [facility]

## 2022-05-03 NOTE — Telephone Encounter (Signed)
Patient notified of medication sent to pharmacy

## 2022-05-11 ENCOUNTER — Telehealth: Payer: Self-pay | Admitting: Hematology and Oncology

## 2022-05-11 ENCOUNTER — Encounter: Payer: Medicare Other | Attending: Physical Medicine and Rehabilitation | Admitting: Registered Nurse

## 2022-05-11 VITALS — BP 119/73 | HR 66 | Ht 62.0 in | Wt 156.2 lb

## 2022-05-11 DIAGNOSIS — G620 Drug-induced polyneuropathy: Secondary | ICD-10-CM | POA: Insufficient documentation

## 2022-05-11 DIAGNOSIS — Z79891 Long term (current) use of opiate analgesic: Secondary | ICD-10-CM | POA: Insufficient documentation

## 2022-05-11 DIAGNOSIS — M2550 Pain in unspecified joint: Secondary | ICD-10-CM | POA: Diagnosis not present

## 2022-05-11 DIAGNOSIS — Z5181 Encounter for therapeutic drug level monitoring: Secondary | ICD-10-CM | POA: Insufficient documentation

## 2022-05-11 DIAGNOSIS — T451X5A Adverse effect of antineoplastic and immunosuppressive drugs, initial encounter: Secondary | ICD-10-CM | POA: Diagnosis not present

## 2022-05-11 DIAGNOSIS — G894 Chronic pain syndrome: Secondary | ICD-10-CM | POA: Insufficient documentation

## 2022-05-11 NOTE — Progress Notes (Signed)
Subjective:    Patient ID: Kristi [name], female    DOB: [birth year], 66 y.o.   MRN: [id number]  HPI: Kristi [name] is a 66 y.o. female who returns for follow up appointment for chronic pain and medication refill. She states her pain is located in her left hand with tingling and bilateral feet with tingling and burning. She rates her pain 4. Her current exercise regime is walking and performing stretching exercises.  [name] Morphine equivalent is 40.00  MME.   UDS ordered today.     Pain Inventory Average Pain 5 Pain Right Now 4 My pain is intermittent, sharp, burning, stabbing, and tingling  In the last 24 hours, has pain interfered with the following? General activity 6 Relation with others 6 Enjoyment of life 7 What TIME of day is your pain at its worst? morning , daytime, evening, and night Sleep (in general) Good  Pain is worse with: walking, sitting, and standing Pain improves with: therapy/exercise and medication Relief from Meds: 8  Family History  Problem Relation Age of Onset   Lung cancer Father    Hypertension Father    Thyroid cancer Father        dx in his 87s   Cancer Father        lung, thyroid, smoker   Breast cancer Paternal Aunt 66   Colon cancer Paternal Aunt        dx in her 50x   Cervical cancer Paternal Aunt        dzx in her 14s   Ovarian cancer Cousin        dx in her lage 71s   Breast cancer Cousin        maternal first cousin, once removed; dx in her late 55s   Breast cancer Cousin        maternal first cousin once removed; dx in late 32s   Hypertension Mother    Diabetes Mother    Dementia Mother    Memory loss Mother    Hypertension Brother    Seizures Brother        Alcohol induced.   Alcohol abuse Brother        drinker, smoker   COPD Brother    Cancer Paternal Uncle        oral cancer   Kidney cancer Paternal Grandmother    Arthritis Daughter        back surgery   Cancer Cousin        several paternal cousins with  brain cancer, leukemia, and other cancers   Cancer Sister        stomach   Social History   Socioeconomic History   Marital status: Married    Spouse name: [name]   Number of children: 1   Years of education: 12   Highest education level: Not on file  Occupational History   Occupation: INVENTORY Hotel manager: [employer]   Occupation: disability no longer working 2015  Tobacco Use   Smoking status: Never   Smokeless tobacco: Never  Vaping Use   Vaping Use: Never used  Substance and Sexual Activity   Alcohol use: No   Drug use: No   Sexual activity: Yes    Comment: lives with husband, disability/retirement. [employer], no dietary restrictions  Other Topics Concern   Not on file  Social History Narrative   Patient is married [name]) and lives at home with her husband.  Patient has one daughter   Patient drinks very little caffeine.   Left handed   Social Determinants of Health   Financial Resource Strain: Low Risk  (06/01/2021)   Overall Financial Resource Strain (CARDIA)    Difficulty of Paying Living Expenses: Not hard at all  Food Insecurity: No Food Insecurity (06/01/2021)   Hunger Vital Sign    Worried About Running Out of Food in the Last Year: Never true    Ran Out of Food in the Last Year: Never true  Transportation Needs: No Transportation Needs (06/01/2021)   PRAPARE - Hydrologist (Medical): No    Lack of Transportation (Non-Medical): No  Physical Activity: Sufficiently Active (06/01/2021)   Exercise Vital Sign    Days of Exercise per Week: 7 days    Minutes of Exercise per Session: 60 min  Stress: No Stress Concern Present (06/01/2021)   [place]    Feeling of Stress : Not at all  Social Connections: Moderately Integrated (06/01/2021)   Social Connection and Isolation Panel [NHANES]    Frequency of Communication with Friends and  Family: More than three times a week    Frequency of Social Gatherings with Friends and Family: More than three times a week    Attends Religious Services: More than 4 times per year    Active Member of Clubs or Organizations: No    Attends Archivist Meetings: Never    Marital Status: Married   Past Surgical History:  Procedure Laterality Date   ANAL SPHINCTEROTOMY  04/2011   [place] Left 02/04/2013   Procedure: LEFT AXILLARY LYMPH NODE DISSECTION;  Surgeon: Stark Klein, MD;  Location: [place];  Service: General;  Laterality: Left;  End: 1512   BREAST LUMPECTOMY WITH NEEDLE LOCALIZATION Left 02/04/2013   Procedure: LEFT BREAST LUMPECTOMY WITH NEEDLE LOCALIZATION;  Surgeon: Stark Klein, MD;  Location: [place];  Service: General;  Laterality: Left;   BREAST SURGERY     Lumpectomy in april 2014   [place]  04/2011   ligation   MASTECTOMY Left 02/15/2017   PORT-A-CATH REMOVAL N/A 04/16/2014   Procedure: REMOVAL PORT-A-CATH;  Surgeon: Stark Klein, MD;  Location: [facility];  Service: General;  Laterality: N/A;   PORTACATH PLACEMENT Right 02/04/2013   Procedure: INSERTION PORT-A-CATH;  Surgeon: Stark Klein, MD;  Location: [place];  Service: General;  Laterality: Right;  Start Time: 0092.   SHOULDER ARTHROSCOPY WITH ROTATOR CUFF REPAIR AND SUBACROMIAL DECOMPRESSION Left 02/24/2014   Procedure: SHOULDER ARTHROSCOPY WITH ROTATOR CUFF REPAIR AND SUBACROMIAL DECOMPRESSION;  Surgeon: Meredith Pel, MD;  Location: [facility];  Service: Orthopedics;  Laterality: Left;  LEFT SHOULDER DIAGNOSTIC OPERATIVE ARTHROSCOPY, SUBACROMIAL DECOMPRESSION, ROTATOR CUFF TEAR REPAIR   SIMPLE MASTECTOMY WITH AXILLARY SENTINEL NODE BIOPSY Left 02/15/2017   Procedure: LEFT MASTECTOMY;  Surgeon: Stark Klein, MD;  Location: [facility];  Service: General;  Laterality: Left;   TOTAL MASTECTOMY Right 12/26/2018   Procedure: RIGHT BREAST PROPHYLATIC MASTECTOMY;  Surgeon: Stark Klein, MD;  Location: [facility];  Service: General;  Laterality: Right;   Past Surgical History:  Procedure Laterality Date   ANAL SPHINCTEROTOMY  04/2011   APPENDECTOMY  1980   AXILLARY LYMPH NODE DISSECTION Left 02/04/2013   Procedure: LEFT AXILLARY LYMPH NODE DISSECTION;  Surgeon: Stark Klein, MD;  Location: [place];  Service: General;  Laterality: Left;  End: 3300   [id number]  LUMPECTOMY WITH NEEDLE LOCALIZATION Left 02/04/2013   Procedure: LEFT BREAST LUMPECTOMY WITH NEEDLE LOCALIZATION;  Surgeon: Stark Klein, MD;  Location: [place];  Service: General;  Laterality: Left;   BREAST SURGERY     Lumpectomy in april 2014   [place]  04/2011   ligation   MASTECTOMY Left 02/15/2017   PORT-A-CATH REMOVAL N/A 04/16/2014   Procedure: REMOVAL PORT-A-CATH;  Surgeon: Stark Klein, MD;  Location[facility];  Service: General;  Laterality: N/A;   PORTACATH PLACEMENT Right 02/04/2013   Procedure: INSERTION PORT-A-CATH;  Surgeon: Stark Klein, MD;  Location: [place] [place];  Service: General;  Laterality: Right;  Start Time: 3532.   SHOULDER ARTHROSCOPY WITH ROTATOR CUFF REPAIR AND SUBACROMIAL DECOMPRESSION Left 02/24/2014   Procedure: SHOULDER ARTHROSCOPY WITH ROTATOR CUFF REPAIR AND SUBACROMIAL DECOMPRESSION;  Surgeon: Meredith Pel, MD;  Location: [facility];  Service: Orthopedics;  Laterality: Left;  LEFT SHOULDER DIAGNOSTIC OPERATIVE ARTHROSCOPY, SUBACROMIAL DECOMPRESSION, ROTATOR CUFF TEAR REPAIR   SIMPLE MASTECTOMY WITH AXILLARY SENTINEL NODE BIOPSY Left 02/15/2017   Procedure: LEFT MASTECTOMY;  Surgeon: Stark Klein, MD;  Location: [place];  Service: General;  Laterality: Left;   TOTAL MASTECTOMY Right 12/26/2018   Procedure: RIGHT BREAST PROPHYLATIC MASTECTOMY;  Surgeon: Stark Klein, MD;  Location: [place];  Service: General;  Laterality: Right;   Past Medical History:  Diagnosis Date   Abnormal gait 12/17/2019   Acute blood loss anemia    Anal fissure 05/03/2011   Anal pain 04/27/2021   Anemia    Iron deficinecy  anemia   Anemia 05/14/2017   Anxiety and depression 05/15/2014   Arthritis    Body mass index (BMI) 30.0-30.9, adult 12/17/2019   Breast cancer ([place])    left ,last radiation 2'15, last chemo 8'14   Cervical cancer screening 06/18/2018   Menarche at 11 Regular and moderate flow  history of abnormal pap in past, 1 mild abnormality years ago that resolved spontaneously on recheck G1P1, s/p 1 svd history of abnormal MGM, b/o breast cancer 2014 No concerns today  gyn surgeries. Lumpectomy  LMP around early 2014   Cervical stenosis of spine 12/17/2019   Constipation 11/13/2016   Contracture of axilla 05/20/2013   Debility 01/18/2020   Essential (primary) hypertension 12/17/2019   Flushing 02/20/2014   Gait instability 12/17/2019   Genetic testing 02/20/2019   Negative genetic testing on the Comprehensive Cancer Panel.  The Comprehensive Common Cancer Panel offered by [facility] includes sequencing and/or deletion duplication testing of the following 46 genes: APC, ATM, AXIN2, BAP1, BARD1, BMPR1A, BRCA1, BRCA2, BRIP1, CDH1, CDK4, CDKN2A, CHEK2, EPCAM, FANCC, FH, FLCN, HOXB13, MET, MITF,  MLH1, MSH2, MSH6, MUTYH, NBN, NF1, NTHL1,  PALB2, PMS2, POLD1, POLE, P   H/O: CVA (cerebrovascular accident) 06/19/2020   Hereditary and idiopathic peripheral neuropathy 11/11/2013   Herpes zoster 05/15/2014   History of chicken pox    History of left breast cancer 02/15/2017   History of radiation therapy 09/09/13-10/28/13   45 gray to left breast, lumpectomy cavity boosted to 63 gray   HTN (hypertension) 11/13/2016   Hyperglycemia 01/09/2016   Hyperlipidemia 05/14/2017   Hypertension    Internal hemorrhoids without complication 9/92/4268   Knee pain, bilateral 11/14/2016   Left upper extremity numbness 06/19/2018   Major depressive disorder, recurrent episode, severe ([place]) 01/12/2014   Malignant neoplasm of upper-inner quadrant of female breast ([place]) 01/24/2013    Left IDC, ER 3%, PR-, Her2neu-  Formatting of  this note might be different from the original. Overview:  Left IDC, ER 3%, PR-, Her2neu-  Last Assessment & Plan:  Pt will continue to take her tamoxifen as previously directed. She appears to be tolerating this with only c/o moderate, occ hot flashes.   Mild neurocognitive disorder due to multiple etiologies 03/17/2020   MRSA (methicillin resistant Staphylococcus aureus) 2009   right groin area-no issues now. 04-07-14 PCR screen negative today.   Neuropathy    Neuropathy of hand, left 09/21/2015   Nodule of finger of left hand 11/14/2016   Palpitations 07/30/2020   Personal history of colonic polyps 04/27/2021   Preventative health care 11/13/2016   Right hip pain 11/14/2016   S/P mastectomy, right 12/26/2018   Sebaceous cyst 04/12/2013   Shoulder joint pain 12/12/2013   Spinal stenosis in cervical region 10/09/2019   Strain of rotator cuff 02/24/2014   Stroke ([place]) 06/2020   Vertigo    BP 119/73   Pulse 66   Ht 5' 2" (1.575 m)   Wt 156 lb 3.2 oz (70.9 kg)   LMP 01/20/2013   SpO2 96%   BMI 28.57 kg/m   Opioid Risk Score:   Fall Risk Score:  `1  Depression screen [facility] 2/9     05/11/2022    2:11 PM 03/30/2022   11:33 AM 03/28/2022    2:36 PM 03/28/2022    2:35 PM 02/23/2022   11:35 AM 01/26/2022   11:31 AM 12/29/2021    2:15 PM  Depression screen PHQ 2/9  Decreased Interest 0 0 0 0 0 0 0  Down, Depressed, Hopeless 0 0 0 0 0 0 1  PHQ - 2 Score 0 0 0 0 0 0 1  Altered sleeping   0      Tired, decreased energy   0      Change in appetite   0      Feeling bad or failure about yourself    0      Trouble concentrating   0      Moving slowly or fidgety/restless   0      Suicidal thoughts   0      PHQ-9 Score   0      Difficult doing work/chores   Not difficult at all         Review of Systems  Musculoskeletal:        Bilateral foot pain  All other systems reviewed and are negative.     Objective:   Physical Exam Vitals and nursing note reviewed.  Constitutional:       Appearance: Normal appearance.  Cardiovascular:     Rate and Rhythm: Normal rate and regular rhythm.     Pulses: Normal pulses.     Heart sounds: Normal heart sounds.  Pulmonary:     Effort: Pulmonary effort is normal.     Breath sounds: Normal breath sounds.  Musculoskeletal:     Cervical back: Normal range of motion and neck supple.     Comments: Normal Muscle Bulk and Muscle Testing Reveals:  Upper Extremities: Full ROM and Muscle Strength  5/5  Lower Extremities : Full ROM and Muscle Strength 5/5 Arises from Table with Ease Narrow Based Gait     Skin:    General: Skin is warm and dry.  Neurological:     Mental Status: She is alert and oriented to person, place, and time.  Psychiatric:        Mood and Affect: Mood normal.        Behavior: Behavior  normal.         Assessment & Plan:  1. Chemotherapy Induced Peripheral Neuropathy: Continue current medication regimen with Gabapentin. Continue to Monitor. 05/11/2022 2. Chronic Pain Syndrome: Refilled: Tramadol 50 mg one tablet twice a day for breakthrough pain #60 and   Hydrocodone 10/325 mg one tablet 2-3 times a day as needed for pain. We will continue the opioid monitoring program, this consists of regular clinic visits, examinations, urine drug screen, pill counts as well as use of [place] Controlled Substance Reporting system. A 12 month History has been reviewed on the [place] Controlled Substance Reporting System on 05/11/2022 3. Polyarthralgia: Continue HEP as Tolerated: Continue to Monitor. 05/11/2022   F/U in 1 month

## 2022-05-11 NOTE — Telephone Encounter (Signed)
Per 7/6 phone line pt called and requested to cancel appointment  she stated she did not want to reschedule.  Appointment canceled per pt request

## 2022-05-13 ENCOUNTER — Other Ambulatory Visit: Payer: Self-pay | Admitting: Cardiology

## 2022-05-13 DIAGNOSIS — I10 Essential (primary) hypertension: Secondary | ICD-10-CM

## 2022-05-16 LAB — TOXASSURE SELECT,+ANTIDEPR,UR

## 2022-05-21 ENCOUNTER — Encounter: Payer: Self-pay | Admitting: Registered Nurse

## 2022-05-22 ENCOUNTER — Ambulatory Visit: Payer: Medicare Other | Admitting: Hematology and Oncology

## 2022-05-22 ENCOUNTER — Telehealth: Payer: Self-pay | Admitting: *Deleted

## 2022-05-22 NOTE — Telephone Encounter (Signed)
Urine drug screen for this encounter is consistent for prescribed medication 

## 2022-06-01 ENCOUNTER — Other Ambulatory Visit: Payer: Self-pay | Admitting: Family Medicine

## 2022-06-07 ENCOUNTER — Telehealth: Payer: Self-pay | Admitting: Registered Nurse

## 2022-06-07 ENCOUNTER — Ambulatory Visit: Payer: Medicare Other

## 2022-06-07 MED ORDER — HYDROCODONE-ACETAMINOPHEN 10-325 MG PO TABS: 1.0000 | ORAL_TABLET | Freq: Four times a day (QID) | ORAL | 0 refills | Status: DC | PRN

## 2022-06-07 NOTE — Telephone Encounter (Signed)
PMP was Reviewed.  Hydrocodone e-scribed today Ms. Frede is aware via My-Chart message.

## 2022-06-22 ENCOUNTER — Encounter: Payer: Self-pay | Admitting: Registered Nurse

## 2022-06-22 ENCOUNTER — Encounter: Payer: Medicare Other | Attending: Physical Medicine and Rehabilitation | Admitting: Registered Nurse

## 2022-06-22 VITALS — BP 119/76 | HR 63 | Ht 62.0 in | Wt 156.2 lb

## 2022-06-22 DIAGNOSIS — G894 Chronic pain syndrome: Secondary | ICD-10-CM | POA: Diagnosis not present

## 2022-06-22 DIAGNOSIS — Z79891 Long term (current) use of opiate analgesic: Secondary | ICD-10-CM

## 2022-06-22 DIAGNOSIS — T451X5A Adverse effect of antineoplastic and immunosuppressive drugs, initial encounter: Secondary | ICD-10-CM | POA: Diagnosis not present

## 2022-06-22 DIAGNOSIS — G629 Polyneuropathy, unspecified: Secondary | ICD-10-CM | POA: Diagnosis not present

## 2022-06-22 DIAGNOSIS — G620 Drug-induced polyneuropathy: Secondary | ICD-10-CM | POA: Diagnosis not present

## 2022-06-22 DIAGNOSIS — Z5181 Encounter for therapeutic drug level monitoring: Secondary | ICD-10-CM

## 2022-06-22 MED ORDER — HYDROCODONE-ACETAMINOPHEN 10-325 MG PO TABS: 1.0000 | ORAL_TABLET | Freq: Four times a day (QID) | ORAL | 0 refills | Status: DC | PRN

## 2022-06-22 NOTE — Progress Notes (Signed)
Subjective:    Patient ID: Kristi [name], female    DOB: 1956/04/10, 66 y.o.   MRN: [id number]  HPI: Kristi [name] is a 65 y.o. female who returns for follow up appointment for chronic pain and medication refill. She states her pain is located in her left hand with tingling and burning and bilateral feet with tingling and burning. She rates her pain 6. Her current exercise regime is walking and performing stretching exercises.  Kristi [name] Morphine equivalent is 40.00 MME.   Last UDS was Performed on 05/11/2022, it was consistent.    Pain Inventory Average Pain 5 Pain Right Now 6 My pain is sharp, burning, dull, and tingling  In the last 24 hours, has pain interfered with the following? General activity 5 Relation with others 0 Enjoyment of life 5 What TIME of day is your pain at its worst? morning , daytime, and night Sleep (in general) Fair  Pain is worse with: walking, sitting, inactivity, and standing Pain improves with: therapy/exercise and medication Relief from Meds: 5  Family History  Problem Relation Age of Onset   Lung cancer Father    Hypertension Father    Thyroid cancer Father        dx in his 77s   Cancer Father        lung, thyroid, smoker   Breast cancer Paternal Aunt 106   Colon cancer Paternal Aunt        dx in her 50x   Cervical cancer Paternal Aunt        dzx in her 28s   Ovarian cancer Cousin        dx in her lage 77s   Breast cancer Cousin        maternal first cousin, once removed; dx in her late 24s   Breast cancer Cousin        maternal first cousin once removed; dx in late 69s   Hypertension Mother    Diabetes Mother    Dementia Mother    Memory loss Mother    Hypertension Brother    Seizures Brother        Alcohol induced.   Alcohol abuse Brother        drinker, smoker   COPD Brother    Cancer Paternal Uncle        oral cancer   Kidney cancer Paternal Grandmother    Arthritis Daughter        back surgery   Cancer Cousin         several paternal cousins with brain cancer, leukemia, and other cancers   Cancer Sister        stomach   Social History   Socioeconomic History   Marital status: Married    Spouse name: [name]   Number of children: 1   Years of education: 12   Highest education level: Not on file  Occupational History   Occupation: INVENTORY Office manager: [employer]   Occupation: disability no longer working 2015  Tobacco Use   Smoking status: Never   Smokeless tobacco: Never  Vaping Use   Vaping Use: Never used  Substance and Sexual Activity   Alcohol use: No   Drug use: No   Sexual activity: Yes    Comment: lives with husband, disability/retirement. [employer], no dietary restrictions  Other Topics Concern   Not on file  Social History Narrative   Patient is married [name]) and lives at  home with her husband.   Patient has one daughter   Patient drinks very little caffeine.   Left handed   Social Determinants of Health   Financial Resource Strain: Low Risk  (06/01/2021)   Overall Financial Resource Strain (CARDIA)    Difficulty of Paying Living Expenses: Not hard at all  Food Insecurity: No Food Insecurity (06/01/2021)   Hunger Vital Sign    Worried About Running Out of Food in the Last Year: Never true    Ran Out of Food in the Last Year: Never true  Transportation Needs: No Transportation Needs (06/01/2021)   PRAPARE - Hydrologist (Medical): No    Lack of Transportation (Non-Medical): No  Physical Activity: Sufficiently Active (06/01/2021)   Exercise Vital Sign    Days of Exercise per Week: 7 days    Minutes of Exercise per Session: 60 min  Stress: No Stress Concern Present (06/01/2021)   [place]    Feeling of Stress : Not at all  Social Connections: Moderately Integrated (06/01/2021)   Social Connection and Isolation Panel [NHANES]    Frequency of  Communication with Friends and Family: More than three times a week    Frequency of Social Gatherings with Friends and Family: More than three times a week    Attends Religious Services: More than 4 times per year    Active Member of Clubs or Organizations: No    Attends Archivist Meetings: Never    Marital Status: Married   Past Surgical History:  Procedure Laterality Date   ANAL SPHINCTEROTOMY  04/2011   [place] Left 02/04/2013   Procedure: LEFT AXILLARY LYMPH NODE DISSECTION;  Surgeon: Stark Klein, MD;  Location: [place];  Service: General;  Laterality: Left;  End: 1512   BREAST LUMPECTOMY WITH NEEDLE LOCALIZATION Left 02/04/2013   Procedure: LEFT BREAST LUMPECTOMY WITH NEEDLE LOCALIZATION;  Surgeon: Stark Klein, MD;  Location: [place];  Service: General;  Laterality: Left;   BREAST SURGERY     Lumpectomy in april 2014   [place]  04/2011   ligation   MASTECTOMY Left 02/15/2017   PORT-A-CATH REMOVAL N/A 04/16/2014   Procedure: REMOVAL PORT-A-CATH;  Surgeon: Stark Klein, MD;  Location: [facility];  Service: General;  Laterality: N/A;   PORTACATH PLACEMENT Right 02/04/2013   Procedure: INSERTION PORT-A-CATH;  Surgeon: Stark Klein, MD;  Location: [place];  Service: General;  Laterality: Right;  Start Time: 0998.   SHOULDER ARTHROSCOPY WITH ROTATOR CUFF REPAIR AND SUBACROMIAL DECOMPRESSION Left 02/24/2014   Procedure: SHOULDER ARTHROSCOPY WITH ROTATOR CUFF REPAIR AND SUBACROMIAL DECOMPRESSION;  Surgeon: Meredith Pel, MD;  Location: [place];  Service: Orthopedics;  Laterality: Left;  LEFT SHOULDER DIAGNOSTIC OPERATIVE ARTHROSCOPY, SUBACROMIAL DECOMPRESSION, ROTATOR CUFF TEAR REPAIR   SIMPLE MASTECTOMY WITH AXILLARY SENTINEL NODE BIOPSY Left 02/15/2017   Procedure: LEFT MASTECTOMY;  Surgeon: Stark Klein, MD;  Location: [place];  Service: General;  Laterality: Left;   TOTAL MASTECTOMY Right 12/26/2018   Procedure: RIGHT BREAST PROPHYLATIC  MASTECTOMY;  Surgeon: Stark Klein, MD;  Location: [place];  Service: General;  Laterality: Right;   Past Surgical History:  Procedure Laterality Date   ANAL SPHINCTEROTOMY  04/2011   APPENDECTOMY  1980   AXILLARY LYMPH NODE DISSECTION Left 02/04/2013   Procedure: LEFT AXILLARY LYMPH NODE DISSECTION;  Surgeon: Stark Klein, MD;  Location: [facility];  Service: General;  Laterality: Left;  End: 1512   BREAST LUMPECTOMY WITH NEEDLE LOCALIZATION Left 02/04/2013   Procedure: LEFT BREAST LUMPECTOMY WITH NEEDLE LOCALIZATION;  Surgeon: Stark Klein, MD;  Location: [place];  Service: General;  Laterality: Left;   BREAST SURGERY     Lumpectomy in april 2014   [place]  04/2011   ligation   MASTECTOMY Left 02/15/2017   PORT-A-CATH REMOVAL N/A 04/16/2014   Procedure: REMOVAL PORT-A-CATH;  Surgeon: Stark Klein, MD;  Location: [facility];  Service: General;  Laterality: N/A;   PORTACATH PLACEMENT Right 02/04/2013   Procedure: INSERTION PORT-A-CATH;  Surgeon: Stark Klein, MD;  Location: [place];  Service: General;  Laterality: Right;  Start Time: 3419.   SHOULDER ARTHROSCOPY WITH ROTATOR CUFF REPAIR AND SUBACROMIAL DECOMPRESSION Left 02/24/2014   Procedure: SHOULDER ARTHROSCOPY WITH ROTATOR CUFF REPAIR AND SUBACROMIAL DECOMPRESSION;  Surgeon: Meredith Pel, MD;  Location: [place];  Service: Orthopedics;  Laterality: Left;  LEFT SHOULDER DIAGNOSTIC OPERATIVE ARTHROSCOPY, SUBACROMIAL DECOMPRESSION, ROTATOR CUFF TEAR REPAIR   SIMPLE MASTECTOMY WITH AXILLARY SENTINEL NODE BIOPSY Left 02/15/2017   Procedure: LEFT MASTECTOMY;  Surgeon: Stark Klein, MD;  Location: [place];  Service: General;  Laterality: Left;   TOTAL MASTECTOMY Right 12/26/2018   Procedure: RIGHT BREAST PROPHYLATIC MASTECTOMY;  Surgeon: Stark Klein, MD;  Location: [facility];  Service: General;  Laterality: Right;   Past Medical History:  Diagnosis Date   Abnormal gait 12/17/2019   Acute blood loss anemia    Anal fissure 05/03/2011   Anal pain 04/27/2021    Anemia    Iron deficinecy anemia   Anemia 05/14/2017   Anxiety and depression 05/15/2014   Arthritis    Body mass index (BMI) 30.0-30.9, adult 12/17/2019   Breast cancer ([place])    left ,last radiation 2'15, last chemo 8'14   Cervical cancer screening 06/18/2018   Menarche at 11 Regular and moderate flow  history of abnormal pap in past, 1 mild abnormality years ago that resolved spontaneously on recheck G1P1, s/p 1 svd history of abnormal MGM, b/o breast cancer 2014 No concerns today  gyn surgeries. Lumpectomy  LMP around early 2014   Cervical stenosis of spine 12/17/2019   Constipation 11/13/2016   Contracture of axilla 05/20/2013   Debility 01/18/2020   Essential (primary) hypertension 12/17/2019   Flushing 02/20/2014   Gait instability 12/17/2019   Genetic testing 02/20/2019   Negative genetic testing on the Comprehensive Cancer Panel.  The Comprehensive Common Cancer Panel offered by [facility] includes sequencing and/or deletion duplication testing of the following 46 genes: APC, ATM, AXIN2, BAP1, BARD1, BMPR1A, BRCA1, BRCA2, BRIP1, CDH1, CDK4, CDKN2A, CHEK2, EPCAM, FANCC, FH, FLCN, HOXB13, MET, MITF,  MLH1, MSH2, MSH6, MUTYH, NBN, NF1, NTHL1,  PALB2, PMS2, POLD1, POLE, P   H/O: CVA (cerebrovascular accident) 06/19/2020   Hereditary and idiopathic peripheral neuropathy 11/11/2013   Herpes zoster 05/15/2014   History of chicken pox    History of left breast cancer 02/15/2017   History of radiation therapy 09/09/13-10/28/13   45 gray to left breast, lumpectomy cavity boosted to 63 gray   HTN (hypertension) 11/13/2016   Hyperglycemia 01/09/2016   Hyperlipidemia 05/14/2017   Hypertension    Internal hemorrhoids without complication 04/27/2978   Knee pain, bilateral 11/14/2016   Left upper extremity numbness 06/19/2018   Major depressive disorder, recurrent episode, severe ([place]) 01/12/2014   Malignant neoplasm of upper-inner quadrant of female breast ([place]) 01/24/2013    Left IDC, ER  3%, PR-, Her2neu-  Formatting of this note might be different  from the original. Overview:   Left IDC, ER 3%, PR-, Her2neu-  Last Assessment & Plan:  Pt will continue to take her tamoxifen as previously directed. She appears to be tolerating this with only c/o moderate, occ hot flashes.   Mild neurocognitive disorder due to multiple etiologies 03/17/2020   MRSA (methicillin resistant Staphylococcus aureus) 2009   right groin area-no issues now. 04-07-14 PCR screen negative today.   Neuropathy    Neuropathy of hand, left 09/21/2015   Nodule of finger of left hand 11/14/2016   Palpitations 07/30/2020   Personal history of colonic polyps 04/27/2021   Preventative health care 11/13/2016   Right hip pain 11/14/2016   S/P mastectomy, right 12/26/2018   Sebaceous cyst 04/12/2013   Shoulder joint pain 12/12/2013   Spinal stenosis in cervical region 10/09/2019   Strain of rotator cuff 02/24/2014   Stroke ([place]) 06/2020   Vertigo    BP 119/76   Pulse 63   Ht $R'5\' 2"'RD$  (1.575 m)   Wt 156 lb 3.2 oz (70.9 kg)   LMP 01/20/2013   SpO2 95%   BMI 28.57 kg/m   Opioid Risk Score:   Fall Risk Score:  `1  Depression screen PHQ 2/9     06/22/2022    1:29 PM 05/11/2022    2:11 PM 03/30/2022   11:33 AM 03/28/2022    2:36 PM 03/28/2022    2:35 PM 02/23/2022   11:35 AM 01/26/2022   11:31 AM  Depression screen PHQ 2/9  Decreased Interest 0 0 0 0 0 0 0  Down, Depressed, Hopeless 0 0 0 0 0 0 0  PHQ - 2 Score 0 0 0 0 0 0 0  Altered sleeping    0     Tired, decreased energy    0     Change in appetite    0     Feeling bad or failure about yourself     0     Trouble concentrating    0     Moving slowly or fidgety/restless    0     Suicidal thoughts    0     PHQ-9 Score    0     Difficult doing work/chores    Not difficult at all         Review of Systems  Musculoskeletal:  Positive for joint swelling.       Bilateral feet pain Left hand pain      Objective:   Physical Exam Vitals and nursing note  reviewed.  Constitutional:      Appearance: Normal appearance.  Cardiovascular:     Rate and Rhythm: Normal rate and regular rhythm.     Pulses: Normal pulses.     Heart sounds: Normal heart sounds.  Pulmonary:     Effort: Pulmonary effort is normal.     Breath sounds: Normal breath sounds.  Musculoskeletal:     Cervical back: Normal range of motion and neck supple.     Comments: Normal Muscle Bulk and Muscle Testing Reveals:  Upper Extremities: Full ROM and Muscle Strength 5/5 Lower Extremities: Full ROM and Muscle Strength 5/5 Arises from Table with ease Narrow Based Gait     Skin:    General: Skin is warm and dry.  Neurological:     Mental Status: She is alert and oriented to person, place, and time.  Psychiatric:        Mood and Affect: Mood normal.  Behavior: Behavior normal.         Assessment & Plan:  1. Chemotherapy Induced Peripheral Neuropathy: Continue current medication regimen with Gabapentin. Continue to Monitor. 06/22/2022 2. Chronic Pain Syndrome: Refilled: Tramadol 50 mg one tablet twice a day for breakthrough pain #60 and   Hydrocodone 10/325 mg one tablet4 times a day as needed for pain #120. We will continue the opioid monitoring program, this consists of regular clinic visits, examinations, urine drug screen, pill counts as well as use of [place] Controlled Substance Reporting system. A 12 month History has been reviewed on the [place] Controlled Substance Reporting System on 06/22/2022 3. Polyarthralgia: Continue HEP as Tolerated: Continue to Monitor. 06/22/2022   F/U in 1 month

## 2022-06-25 ENCOUNTER — Encounter: Payer: Self-pay | Admitting: Registered Nurse

## 2022-07-11 ENCOUNTER — Telehealth: Payer: Self-pay | Admitting: Registered Nurse

## 2022-07-11 MED ORDER — GABAPENTIN 600 MG PO TABS
600.0000 mg | ORAL_TABLET | Freq: Two times a day (BID) | ORAL | 3 refills | Status: DC
Start: 2022-07-11 — End: 2022-10-13

## 2022-07-11 MED ORDER — GABAPENTIN 300 MG PO CAPS: 900.0000 mg | ORAL_CAPSULE | Freq: Every day | ORAL | 3 refills | Status: DC

## 2022-07-11 NOTE — Telephone Encounter (Signed)
Gabapentin refill, sent to pharmacy.  Kristi [name] is aware via My-Chart message.

## 2022-07-24 ENCOUNTER — Ambulatory Visit (INDEPENDENT_AMBULATORY_CARE_PROVIDER_SITE_OTHER): Payer: Medicare Other

## 2022-07-24 VITALS — Ht 62.0 in | Wt 156.0 lb

## 2022-07-24 DIAGNOSIS — Z0000 Encounter for general adult medical examination without abnormal findings: Secondary | ICD-10-CM | POA: Diagnosis not present

## 2022-07-24 NOTE — Progress Notes (Signed)
Subjective:   Kristi [name] is a 66 y.o. female who presents for Medicare Annual (Subsequent) preventive examination.  I connected with Kristi [name] today by telephone and verified that I am speaking with the correct person using two identifiers. Location patient: home Location provider: work Persons participating in the virtual visit: patient, Marine scientist.    I discussed the limitations, risks, security and privacy concerns of performing an evaluation and management service by telephone and the availability of in person appointments. I also discussed with the patient that there may be a patient responsible charge related to this service. The patient expressed understanding and verbally consented to this telephonic visit.    Interactive audio and video telecommunications were attempted between this provider and patient, however failed, due to patient having technical difficulties OR patient did not have access to video capability.  We continued and completed visit with audio only.  Some vital signs may be absent or patient reported.   Time Spent with patient on telephone encounter: 25 minutes   Review of Systems     Cardiac Risk Factors include: advanced age (>58mn, >>7women);hypertension;dyslipidemia     Objective:    Today's Vitals   07/24/22 1331  Weight: 156 lb (70.8 kg)  Height: 5' 2"  (1.575 m)   Body mass index is 28.53 kg/m.     07/24/2022    1:34 PM 05/11/2022    2:11 PM 02/23/2022   11:35 AM 01/26/2022   11:32 AM 06/01/2021    2:24 PM 05/24/2021   11:13 AM 06/24/2020    3:55 PM  Advanced Directives  Does Patient Have a Medical Advance Directive? No No No No No No No  Would patient like information on creating a medical advance directive?     No - Patient declined No - Patient declined No - Patient declined    Current Medications (verified) Outpatient Encounter Medications as of 07/24/2022  Medication Sig   Apoaequorin (PREVAGEN) 10 MG CAPS Take 1 capsule by mouth daily.    ARIPiprazole (ABILIFY) 5 MG tablet Take 5 mg by mouth at bedtime.   aspirin EC 325 MG tablet Take 1 tablet (325 mg total) by mouth daily.   atorvastatin (LIPITOR) 40 MG tablet TAKE 1 TABLET BY MOUTH EVERY DAY   gabapentin (NEURONTIN) 300 MG capsule Take 3 capsules (900 mg total) by mouth at bedtime.   gabapentin (NEURONTIN) 600 MG tablet Take 1 tablet (600 mg total) by mouth 2 (two) times daily.   HYDROcodone-acetaminophen (NORCO) 10-325 MG tablet Take 1 tablet by mouth 4 (four) times daily as needed.   hydrOXYzine (ATARAX/VISTARIL) 50 MG tablet Take 1 tablet (50 mg total) by mouth 2 (two) times daily.   Multiple Vitamin (MULTIVITAMIN WITH MINERALS) TABS tablet Take 1 tablet by mouth daily.   polyethylene glycol (MIRALAX / GLYCOLAX) 17 g packet Take 17 g by mouth daily as needed for mild constipation.   pyridOXINE (B-6) 50 MG tablet Take 50 mg by mouth daily.   traMADol (ULTRAM) 50 MG tablet Take 1 tablet (50 mg total) by mouth 2 (two) times daily as needed. Break through Pain   traZODone (DESYREL) 50 MG tablet Take 1 tablet (50 mg total) by mouth at bedtime.   valsartan (DIOVAN) 160 MG tablet Take 1 tablet (160 mg total) by mouth daily.   anastrozole (ARIMIDEX) 1 MG tablet Take 1 mg by mouth every morning.   Turmeric (QC TUMERIC COMPLEX PO) Take 1 tablet by mouth daily.   No facility-administered encounter medications on  file as of 07/24/2022.    Allergies (verified) Patient has no known allergies.   History: Past Medical History:  Diagnosis Date   Abnormal gait 12/17/2019   Acute blood loss anemia    Anal fissure 05/03/2011   Anal pain 04/27/2021   Anemia    Iron deficinecy anemia   Anemia 05/14/2017   Anxiety and depression 05/15/2014   Arthritis    Body mass index (BMI) 30.0-30.9, adult 12/17/2019   Breast cancer ([place])    left ,last radiation 2'15, last chemo 8'14   Cervical cancer screening 06/18/2018   Menarche at 11 Regular and moderate flow  history of abnormal pap in  past, 1 mild abnormality years ago that resolved spontaneously on recheck G1P1, s/p 1 svd history of abnormal MGM, b/o breast cancer 2014 No concerns today  gyn surgeries. Lumpectomy  LMP around early 2014   Cervical stenosis of spine 12/17/2019   Constipation 11/13/2016   Contracture of axilla 05/20/2013   Debility 01/18/2020   Essential (primary) hypertension 12/17/2019   Flushing 02/20/2014   Gait instability 12/17/2019   Genetic testing 02/20/2019   Negative genetic testing on the Comprehensive Cancer Panel.  The Comprehensive Common Cancer Panel offered by [facility] includes sequencing and/or deletion duplication testing of the following 46 genes: APC, ATM, AXIN2, BAP1, BARD1, BMPR1A, BRCA1, BRCA2, BRIP1, CDH1, CDK4, CDKN2A, CHEK2, EPCAM, FANCC, FH, FLCN, HOXB13, MET, MITF,  MLH1, MSH2, MSH6, MUTYH, NBN, NF1, NTHL1,  PALB2, PMS2, POLD1, POLE, P   H/O: CVA (cerebrovascular accident) 06/19/2020   Hereditary and idiopathic peripheral neuropathy 11/11/2013   Herpes zoster 05/15/2014   History of chicken pox    History of left breast cancer 02/15/2017   History of radiation therapy 09/09/13-10/28/13   45 gray to left breast, lumpectomy cavity boosted to 63 gray   HTN (hypertension) 11/13/2016   Hyperglycemia 01/09/2016   Hyperlipidemia 05/14/2017   Hypertension    Internal hemorrhoids without complication 5/85/2778   Knee pain, bilateral 11/14/2016   Left upper extremity numbness 06/19/2018   Major depressive disorder, recurrent episode, severe ([place]) 01/12/2014   Malignant neoplasm of upper-inner quadrant of female breast ([place]) 01/24/2013    Left IDC, ER 3%, PR-, Her2neu-  Formatting of this note might be different from the original. Overview:   Left IDC, ER 3%, PR-, Her2neu-  Last Assessment & Plan:  Pt will continue to take her tamoxifen as previously directed. She appears to be tolerating this with only c/o moderate, occ hot flashes.   Mild neurocognitive disorder due to multiple etiologies  03/17/2020   MRSA (methicillin resistant Staphylococcus aureus) 2009   right groin area-no issues now. 04-07-14 PCR screen negative today.   Neuropathy    Neuropathy of hand, left 09/21/2015   Nodule of finger of left hand 11/14/2016   Palpitations 07/30/2020   Personal history of colonic polyps 04/27/2021   Preventative health care 11/13/2016   Right hip pain 11/14/2016   S/P mastectomy, right 12/26/2018   Sebaceous cyst 04/12/2013   Shoulder joint pain 12/12/2013   Spinal stenosis in cervical region 10/09/2019   Strain of rotator cuff 02/24/2014   Stroke ([place]) 06/2020   Vertigo    Past Surgical History:  Procedure Laterality Date   ANAL SPHINCTEROTOMY  04/2011   APPENDECTOMY  1980   AXILLARY LYMPH NODE DISSECTION Left 02/04/2013   Procedure: LEFT AXILLARY LYMPH NODE DISSECTION;  Surgeon: Stark Klein, MD;  Location: [facility];  Service: General;  Laterality: Left;  End: 1512   BREAST LUMPECTOMY  WITH NEEDLE LOCALIZATION Left 02/04/2013   Procedure: LEFT BREAST LUMPECTOMY WITH NEEDLE LOCALIZATION;  Surgeon: Stark Klein, MD;  Location: [facility];  Service: General;  Laterality: Left;   BREAST SURGERY     Lumpectomy in april 2014   [place]  04/2011   ligation   MASTECTOMY Left 02/15/2017   PORT-A-CATH REMOVAL N/A 04/16/2014   Procedure: REMOVAL PORT-A-CATH;  Surgeon: Stark Klein, MD;  Location: [facility];  Service: General;  Laterality: N/A;   PORTACATH PLACEMENT Right 02/04/2013   Procedure: INSERTION PORT-A-CATH;  Surgeon: Stark Klein, MD;  Location: [facility];  Service: General;  Laterality: Right;  Start Time: 8101.   SHOULDER ARTHROSCOPY WITH ROTATOR CUFF REPAIR AND SUBACROMIAL DECOMPRESSION Left 02/24/2014   Procedure: SHOULDER ARTHROSCOPY WITH ROTATOR CUFF REPAIR AND SUBACROMIAL DECOMPRESSION;  Surgeon: Meredith Pel, MD;  Location: [facility];  Service: Orthopedics;  Laterality: Left;  LEFT SHOULDER DIAGNOSTIC OPERATIVE ARTHROSCOPY, SUBACROMIAL DECOMPRESSION, ROTATOR CUFF TEAR REPAIR    SIMPLE MASTECTOMY WITH AXILLARY SENTINEL NODE BIOPSY Left 02/15/2017   Procedure: LEFT MASTECTOMY;  Surgeon: Stark Klein, MD;  Location: [facility];  Service: General;  Laterality: Left;   TOTAL MASTECTOMY Right 12/26/2018   Procedure: RIGHT BREAST PROPHYLATIC MASTECTOMY;  Surgeon: Stark Klein, MD;  Location: [facility];  Service: General;  Laterality: Right;   Family History  Problem Relation Age of Onset   Lung cancer Father    Hypertension Father    Thyroid cancer Father        dx in his 98s   Cancer Father        lung, thyroid, smoker   Breast cancer Paternal Aunt 87   Colon cancer Paternal Aunt        dx in her 50x   Cervical cancer Paternal Aunt        dzx in her 59s   Ovarian cancer Cousin        dx in her lage 16s   Breast cancer Cousin        maternal first cousin, once removed; dx in her late 8s   Breast cancer Cousin        maternal first cousin once removed; dx in late 77s   Hypertension Mother    Diabetes Mother    Dementia Mother    Memory loss Mother    Hypertension Brother    Seizures Brother        Alcohol induced.   Alcohol abuse Brother        drinker, smoker   COPD Brother    Cancer Paternal Uncle        oral cancer   Kidney cancer Paternal Grandmother    Arthritis Daughter        back surgery   Cancer Cousin        several paternal cousins with brain cancer, leukemia, and other cancers   Cancer Sister        stomach   Social History   Socioeconomic History   Marital status: Married    Spouse name: [name]   Number of children: 1   Years of education: 12   Highest education level: Not on file  Occupational History   Occupation: INVENTORY Hotel manager: [employer]   Occupation: disability no longer working 2015  Tobacco Use   Smoking status: Never   Smokeless tobacco: Never  Vaping Use   Vaping Use: Never used  Substance and Sexual Activity   Alcohol use: No   Drug  use: No   Sexual activity: Yes    Comment: lives with  husband, disability/retirement. [employer], no dietary restrictions  Other Topics Concern   Not on file  Social History Narrative   Patient is married [name]) and lives at home with her husband.   Patient has one daughter   Patient drinks very little caffeine.   Left handed   Social Determinants of Health   Financial Resource Strain: Low Risk  (07/24/2022)   Overall Financial Resource Strain (CARDIA)    Difficulty of Paying Living Expenses: Not hard at all  Food Insecurity: No Food Insecurity (07/24/2022)   Hunger Vital Sign    Worried About Running Out of Food in the Last Year: Never true    Ran Out of Food in the Last Year: Never true  Transportation Needs: No Transportation Needs (07/24/2022)   PRAPARE - Hydrologist (Medical): No    Lack of Transportation (Non-Medical): No  Physical Activity: Sufficiently Active (07/24/2022)   Exercise Vital Sign    Days of Exercise per Week: 6 days    Minutes of Exercise per Session: 140 min  Stress: No Stress Concern Present (07/24/2022)   [place]    Feeling of Stress : Not at all  Social Connections: [place] (07/24/2022)   Social Connection and Isolation Panel [NHANES]    Frequency of Communication with Friends and Family: More than three times a week    Frequency of Social Gatherings with Friends and Family: Once a week    Attends Religious Services: 1 to 4 times per year    Active Member of [employer] or Organizations: Yes    Attends Music therapist: More than 4 times per year    Marital Status: Married    Tobacco Counseling Counseling given: Not Answered   Clinical Intake:  Pre-visit preparation completed: Yes  Pain : No/denies pain     BMI - recorded: 28.53 Nutritional Status: BMI 25 -29 Overweight Nutritional Risks: None Diabetes: No  How often do you need to have someone help you when you read  instructions, pamphlets, or other written materials from your doctor or pharmacy?: 1 - Never  Diabetic?No     Information entered by :: Caroleen Hamman LPN   Activities of Daily Living    07/24/2022    1:36 PM 07/24/2022   11:14 AM  In your present state of health, do you have any difficulty performing the following activities:  Hearing? 0 0  Vision? 0 0  Difficulty concentrating or making decisions? 0 0  Walking or climbing stairs? 0 0  Dressing or bathing? 0 0  Doing errands, shopping? 0 0  Preparing Food and eating ? N N  Using the Toilet? N N  In the past six months, have you accidently leaked urine? N N  Do you have problems with loss of bowel control? N N  Managing your Medications? N N  Managing your Finances? N N  Housekeeping or managing your Housekeeping? N N    Patient Care Team: Mosie Lukes, MD as PCP - General (Family Medicine) Nicholas Lose, MD as Consulting Physician (Hematology and Oncology) Lin Landsman, MD as Consulting Physician (Family Medicine) Inocencio Homes, [place] as Consulting Physician (Podiatry) Gery Pray, MD as Consulting Physician (Radiation Oncology) Dan Humphreys as Consulting Physician (Nurse Practitioner)  Indicate any recent Medical Services you may have received from other than [facility] providers in the past  year (date may be approximate).     Assessment:   This is a routine wellness examination for Kristi [name].  Hearing/Vision screen Hearing Screening - Comments:: No issues Vision Screening - Comments:: Last eye exam-07/2021-[facility]  Dietary issues and exercise activities discussed: Current Exercise Habits: Home exercise routine, Type of exercise: walking, Time (Minutes): 60, Frequency (Times/Week): 6, Weekly Exercise (Minutes/Week): 360, Intensity: Mild, Exercise limited by: None identified   Goals Addressed             This Visit's Progress    Increase physical activity   On track      Depression Screen    07/24/2022     1:36 PM 06/22/2022    1:29 PM 05/11/2022    2:11 PM 03/30/2022   11:33 AM 03/28/2022    2:36 PM 03/28/2022    2:35 PM 02/23/2022   11:35 AM  PHQ 2/9 Scores  PHQ - 2 Score 0 0 0 0 0 0 0  PHQ- 9 Score     0      Fall Risk    07/24/2022    1:35 PM 07/24/2022   11:14 AM 06/22/2022    1:29 PM 05/11/2022    2:11 PM 03/30/2022   11:33 AM  [place] in the past year? 0 1 0 0 0  Number falls in past yr: 0 0   0  Injury with Fall? 0 0   0  Follow up Falls prevention discussed        FALL RISK PREVENTION PERTAINING TO THE HOME:  Any stairs in or around the home? Yes  If so, are there any without handrails? No  Home free of loose throw rugs in walkways, pet beds, electrical cords, etc? Yes  Adequate lighting in your home to reduce risk of falls? Yes   ASSISTIVE DEVICES UTILIZED TO PREVENT FALLS:  Life alert? No  Use of a cane, walker or w/c? No  Grab bars in the bathroom? Yes  Shower chair or bench in shower? No  Elevated toilet seat or a handicapped toilet? No   TIMED UP AND GO:  Was the test performed? No . Phone visit   Cognitive Function:    12/08/2019    9:43 AM 04/30/2018   11:02 AM  MMSE - Mini Mental State Exam  Orientation to time 5 5  Orientation to Place 5 5  Registration 3 3  Attention/ Calculation 0 5  Recall 3 1  Language- name 2 objects 2 2  Language- repeat 1 1  Language- follow 3 step command 2 3  Language- read & follow direction 1 1  Write a sentence 1 1  Copy design 1 1  Total score 24 28        07/24/2022    1:41 PM  6CIT Screen  What Year? 0 points  What month? 0 points  What time? 0 points  Count back from 20 0 points  Months in reverse 0 points  Repeat phrase 0 points  Total Score 0 points    Immunizations Immunization History  Administered Date(s) Administered   Fluad Quad(high Dose 65+) 07/13/2022   Influenza Inj Mdck Quad Pf 07/29/2018   Influenza-Unspecified 07/15/2019, 08/03/2021   PFIZER(Purple Top)SARS-COV-2 Vaccination  01/30/2020, 02/24/2020, 07/09/2020, 11/25/2020   Pfizer Covid-19 Vaccine Bivalent Booster 84yr & up 07/13/2022    TDAP status: Due, Education has been provided regarding the importance of this vaccine. Advised may receive this vaccine at local pharmacy or Health  Dept. Aware to provide a copy of the vaccination record if obtained from local pharmacy or Health Dept. Verbalized acceptance and understanding.  Flu Vaccine status: Up to date  Pneumococcal vaccine status: Due, Education has been provided regarding the importance of this vaccine. Advised may receive this vaccine at local pharmacy or Health Dept. Aware to provide a copy of the vaccination record if obtained from local pharmacy or Health Dept. Verbalized acceptance and understanding.  Covid-19 vaccine status: Completed vaccines  Qualifies for Shingles Vaccine? Yes   Zostavax completed No   Shingrix Completed?: No.    Education has been provided regarding the importance of this vaccine. Patient has been advised to call insurance company to determine out of pocket expense if they have not yet received this vaccine. Advised may also receive vaccine at local pharmacy or Health Dept. Verbalized acceptance and understanding.  Screening Tests Health Maintenance  Topic Date Due   Hepatitis C Screening  Never done   TETANUS/TDAP  Never done   Zoster Vaccines- Shingrix (1 of 2) Never done   Pneumonia Vaccine 5+ Years old (1 - PCV) Never done   COVID-19 Vaccine (6 - Pfizer risk series) 09/07/2022   COLONOSCOPY (Pts 45-103yr Insurance coverage will need to be confirmed)  02/25/2025   INFLUENZA VACCINE  Completed   DEXA SCAN  Completed   HPV VACCINES  Aged Out   MAMMOGRAM  Discontinued    Health Maintenance  Health Maintenance Due  Topic Date Due   Hepatitis C Screening  Never done   TETANUS/TDAP  Never done   Zoster Vaccines- Shingrix (1 of 2) Never done   Pneumonia Vaccine 67 Years old (1 - PCV) Never done    Colorectal cancer  screening: Type of screening: Colonoscopy. Completed 2021-per patient. Repeat every 10 years  Mammogram status: No longer required due to bilateral mastectomy.  Bone Density status: Completed 03/17/2021. Results reflect: Bone density results: NORMAL. Repeat every 5 years.  Lung Cancer Screening: (Low Dose CT Chest recommended if Age 67-80years, 30 pack-year currently smoking OR have quit w/in 15years.) does not qualify.     Additional Screening:  Hepatitis C Screening: does qualify; Patient to discuss with PCP at next office visit  Vision Screening: Recommended annual ophthalmology exams for early detection of glaucoma and other disorders of the eye. Is the patient up to date with their annual eye exam?  Yes  Who is the provider or what is the name of the office in which the patient attends annual eye exams? [facility]   Dental Screening: Recommended annual dental exams for proper oral hygiene  Community Resource Referral / Chronic Care Management: CRR required this visit?  No   CCM required this visit?  No      Plan:     I have personally reviewed and noted the following in the patient's chart:   Medical and social history Use of alcohol, tobacco or illicit drugs  Current medications and supplements including opioid prescriptions. Patient is currently taking opioid prescriptions. Information provided to patient regarding non-opioid alternatives. Patient advised to discuss non-opioid treatment plan with their provider. Functional ability and status Nutritional status Physical activity Advanced directives List of other physicians Hospitalizations, surgeries, and ER visits in previous 12 months Vitals Screenings to include cognitive, depression, and falls Referrals and appointments  In addition, I have reviewed and discussed with patient certain preventive protocols, quality metrics, and best practice recommendations. A written personalized care plan for preventive services  as well as  general preventive health recommendations were provided to patient.   Due to this being a telephonic visit, the after visit summary with patients personalized plan was offered to patient via mail or my-chart. Patient would like to access on my-chart.   Marta Antu, LPN   05/20/8062  Nurse Health Advisor  Nurse Notes: None

## 2022-07-24 NOTE — Patient Instructions (Signed)
[name] , Thank you for taking time to complete your Medicare Wellness Visit. I appreciate your ongoing commitment to your health goals. Please review the following plan we discussed and let me know if I can assist you in the future.   Screening recommendations/referrals: Colonoscopy: Completed in 2021-per our conversation-[facility] recommendations for repeat Mammogram: No longer required Bone Density: Completed 03/17/2021-Due 03/17/2022 Recommended yearly ophthalmology/optometry visit for glaucoma screening and checkup Recommended yearly dental visit for hygiene and checkup  Vaccinations: Influenza vaccine: Up to date Pneumococcal vaccine: Due-May obtain vaccine at our office or your local pharmacy. Tdap vaccine: Due-May obtain vaccine at  your local pharmacy. Shingles vaccine: Due-May obtain vaccine at  your local pharmacy.   Covid-19:Up to date  Advanced directives: May pick up information at your next visit  Conditions/risks identified: See problem list  Next appointment: Follow up in one year for your annual wellness visit    Preventive Care 65 Years and Older, Female Preventive care refers to lifestyle choices and visits with your health care provider that can promote health and wellness. What does preventive care include? A yearly physical exam. This is also called an annual well check. Dental exams once or twice a year. Routine eye exams. Ask your health care provider how often you should have your eyes checked. Personal lifestyle choices, including: Daily care of your teeth and gums. Regular physical activity. Eating a healthy diet. Avoiding tobacco and drug use. Limiting alcohol use. Practicing safe sex. Taking low-dose aspirin every day. Taking vitamin and mineral supplements as recommended by your health care provider. What happens during an annual well check? The services and screenings done by your health care provider during your annual well check will depend on  your age, overall health, lifestyle risk factors, and family history of disease. Counseling  Your health care provider may ask you questions about your: Alcohol use. Tobacco use. Drug use. Emotional well-being. Home and relationship well-being. Sexual activity. Eating habits. History of falls. Memory and ability to understand (cognition). Work and work Statistician. Reproductive health. Screening  You may have the following tests or measurements: Height, weight, and BMI. Blood pressure. Lipid and cholesterol levels. These may be checked every 5 years, or more frequently if you are over 39 years old. Skin check. Lung cancer screening. You may have this screening every year starting at age 27 if you have a 30-pack-year history of smoking and currently smoke or have quit within the past 15 years. Fecal occult blood test (FOBT) of the stool. You may have this test every year starting at age 73. Flexible sigmoidoscopy or colonoscopy. You may have a sigmoidoscopy every 5 years or a colonoscopy every 10 years starting at age 54. Hepatitis C blood test. Hepatitis B blood test. Sexually transmitted disease (STD) testing. Diabetes screening. This is done by checking your blood sugar (glucose) after you have not eaten for a while (fasting). You may have this done every 1-3 years. Bone density scan. This is done to screen for osteoporosis. You may have this done starting at age 2. Mammogram. This may be done every 1-2 years. Talk to your health care provider about how often you should have regular mammograms. Talk with your health care provider about your test results, treatment options, and if necessary, the need for more tests. Vaccines  Your health care provider may recommend certain vaccines, such as: Influenza vaccine. This is recommended every year. Tetanus, diphtheria, and acellular pertussis (Tdap, Td) vaccine. You may need a Td booster  every 10 years. Zoster vaccine. You may need this  after age 18. Pneumococcal 13-valent conjugate (PCV13) vaccine. One dose is recommended after age 57. Pneumococcal polysaccharide (PPSV23) vaccine. One dose is recommended after age 35. Talk to your health care provider about which screenings and vaccines you need and how often you need them. This information is not intended to replace advice given to you by your health care provider. Make sure you discuss any questions you have with your health care provider. Document Released: 11/19/2015 Document Revised: 07/12/2016 Document Reviewed: 08/24/2015 Elsevier Interactive Patient Education  2017 [employer] Prevention in the Home Falls can cause injuries. They can happen to people of all ages. There are many things you can do to make your home safe and to help prevent falls. What can I do on the outside of my home? Regularly fix the edges of walkways and driveways and fix any cracks. Remove anything that might make you trip as you walk through a door, such as a raised step or threshold. Trim any bushes or trees on the path to your home. Use bright outdoor lighting. Clear any walking paths of anything that might make someone trip, such as rocks or tools. Regularly check to see if handrails are loose or broken. Make sure that both sides of any steps have handrails. Any raised decks and porches should have guardrails on the edges. Have any leaves, snow, or ice cleared regularly. Use sand or salt on walking paths during winter. Clean up any spills in your garage right away. This includes oil or grease spills. What can I do in the bathroom? Use night lights. Install grab bars by the toilet and in the tub and shower. Do not use towel bars as grab bars. Use non-skid mats or decals in the tub or shower. If you need to sit down in the shower, use a plastic, non-slip stool. Keep the floor dry. Clean up any water that spills on the floor as soon as it happens. Remove soap buildup in the tub or  shower regularly. Attach bath mats securely with double-sided non-slip rug tape. Do not have throw rugs and other things on the floor that can make you trip. What can I do in the bedroom? Use night lights. Make sure that you have a light by your bed that is easy to reach. Do not use any sheets or blankets that are too big for your bed. They should not hang down onto the floor. Have a firm chair that has side arms. You can use this for support while you get dressed. Do not have throw rugs and other things on the floor that can make you trip. What can I do in the kitchen? Clean up any spills right away. Avoid walking on wet floors. Keep items that you use a lot in easy-to-reach places. If you need to reach something above you, use a strong step stool that has a grab bar. Keep electrical cords out of the way. Do not use floor polish or wax that makes floors slippery. If you must use wax, use non-skid floor wax. Do not have throw rugs and other things on the floor that can make you trip. What can I do with my stairs? Do not leave any items on the stairs. Make sure that there are handrails on both sides of the stairs and use them. Fix handrails that are broken or loose. Make sure that handrails are as long as the stairways. Check any carpeting to  make sure that it is firmly attached to the stairs. Fix any carpet that is loose or worn. Avoid having throw rugs at the top or bottom of the stairs. If you do have throw rugs, attach them to the floor with carpet tape. Make sure that you have a light switch at the top of the stairs and the bottom of the stairs. If you do not have them, ask someone to add them for you. What else can I do to help prevent falls? Wear shoes that: Do not have high heels. Have rubber bottoms. Are comfortable and fit you well. Are closed at the toe. Do not wear sandals. If you use a stepladder: Make sure that it is fully opened. Do not climb a closed stepladder. Make  sure that both sides of the stepladder are locked into place. Ask someone to hold it for you, if possible. Clearly mark and make sure that you can see: Any grab bars or handrails. First and last steps. Where the edge of each step is. Use tools that help you move around (mobility aids) if they are needed. These include: Canes. Walkers. Scooters. Crutches. Turn on the lights when you go into a dark area. Replace any light bulbs as soon as they burn out. Set up your furniture so you have a clear path. Avoid moving your furniture around. If any of your floors are uneven, fix them. If there are any pets around you, be aware of where they are. Review your medicines with your doctor. Some medicines can make you feel dizzy. This can increase your chance of falling. Ask your doctor what other things that you can do to help prevent falls. This information is not intended to replace advice given to you by your health care provider. Make sure you discuss any questions you have with your health care provider. Document Released: 08/19/2009 Document Revised: 03/30/2016 Document Reviewed: 11/27/2014 Elsevier Interactive Patient Education  2017 [employer].

## 2022-08-03 ENCOUNTER — Encounter: Payer: Self-pay | Admitting: Registered Nurse

## 2022-08-03 ENCOUNTER — Encounter: Payer: Medicare Other | Attending: Physical Medicine and Rehabilitation | Admitting: Registered Nurse

## 2022-08-03 VITALS — BP 122/70 | HR 108 | Ht 62.0 in | Wt 158.4 lb

## 2022-08-03 DIAGNOSIS — G629 Polyneuropathy, unspecified: Secondary | ICD-10-CM | POA: Diagnosis not present

## 2022-08-03 DIAGNOSIS — M2550 Pain in unspecified joint: Secondary | ICD-10-CM | POA: Diagnosis not present

## 2022-08-03 DIAGNOSIS — G620 Drug-induced polyneuropathy: Secondary | ICD-10-CM | POA: Insufficient documentation

## 2022-08-03 DIAGNOSIS — I890 Lymphedema, not elsewhere classified: Secondary | ICD-10-CM | POA: Diagnosis not present

## 2022-08-03 DIAGNOSIS — T451X5A Adverse effect of antineoplastic and immunosuppressive drugs, initial encounter: Secondary | ICD-10-CM | POA: Insufficient documentation

## 2022-08-03 DIAGNOSIS — G894 Chronic pain syndrome: Secondary | ICD-10-CM | POA: Diagnosis not present

## 2022-08-03 DIAGNOSIS — Z79891 Long term (current) use of opiate analgesic: Secondary | ICD-10-CM | POA: Diagnosis not present

## 2022-08-03 DIAGNOSIS — Z5181 Encounter for therapeutic drug level monitoring: Secondary | ICD-10-CM | POA: Diagnosis not present

## 2022-08-03 MED ORDER — HYDROCODONE-ACETAMINOPHEN 10-325 MG PO TABS: 1.0000 | ORAL_TABLET | Freq: Four times a day (QID) | ORAL | 0 refills | Status: DC | PRN

## 2022-08-03 MED ORDER — TRAMADOL HCL 50 MG PO TABS: 50.0000 mg | ORAL_TABLET | Freq: Two times a day (BID) | ORAL | 1 refills | Status: DC | PRN

## 2022-08-03 MED ORDER — GABAPENTIN 300 MG PO CAPS: 900.0000 mg | ORAL_CAPSULE | Freq: Every day | ORAL | 3 refills | Status: DC

## 2022-08-03 NOTE — Progress Notes (Signed)
Subjective:    Patient ID: Kristi [name], female    DOB: 06-20-1956, 66 y.o.   MRN: [id number]  HPI: Kristi [name] is a 66 y.o. female who returns for follow up appointment for chronic pain and medication refill. She states her pain is located in her left hand with tingling and burning and bilateral feet with tingling and burning. She rates her pain 5. Her current exercise regime is walking and performing stretching exercises.  Kristi [name] Morphine equivalent is 40.00 MME.   Last UDS was Performed 05/11/2022, it was consistent.   Kristi [name] Pulse: 68   Pain Inventory Average Pain 5 Pain Right Now 5 My pain is constant, sharp, burning, stabbing, and tingling  In the last 24 hours, has pain interfered with the following? General activity 3 Relation with others 2 Enjoyment of life 2 What TIME of day is your pain at its worst? morning , daytime, and evening Sleep (in general) NA  Pain is worse with: walking, sitting, inactivity, and standing Pain improves with: therapy/exercise and medication Relief from Meds: 5  Family History  Problem Relation Age of Onset   Lung cancer Father    Hypertension Father    Thyroid cancer Father        dx in his 24s   Cancer Father        lung, thyroid, smoker   Breast cancer Paternal Aunt 65   Colon cancer Paternal Aunt        dx in her 50x   Cervical cancer Paternal Aunt        dzx in her 27s   Ovarian cancer Cousin        dx in her lage 42s   Breast cancer Cousin        maternal first cousin, once removed; dx in her late 23s   Breast cancer Cousin        maternal first cousin once removed; dx in late 62s   Hypertension Mother    Diabetes Mother    Dementia Mother    Memory loss Mother    Hypertension Brother    Seizures Brother        Alcohol induced.   Alcohol abuse Brother        drinker, smoker   COPD Brother    Cancer Paternal Uncle        oral cancer   Kidney cancer Paternal Grandmother    Arthritis Daughter        back  surgery   Cancer Cousin        several paternal cousins with brain cancer, leukemia, and other cancers   Cancer Sister        stomach   Social History   Socioeconomic History   Marital status: Married    Spouse name: [name]   Number of children: 1   Years of education: 12   Highest education level: Not on file  Occupational History   Occupation: INVENTORY Hotel manager: [employer]   Occupation: disability no longer working 2015  Tobacco Use   Smoking status: Never   Smokeless tobacco: Never  Vaping Use   Vaping Use: Never used  Substance and Sexual Activity   Alcohol use: No   Drug use: No   Sexual activity: Yes    Comment: lives with husband, disability/retirement. [employer], no dietary restrictions  Other Topics Concern   Not on file  Social History Narrative   Patient is  married [name]) and lives at home with her husband.   Patient has one daughter   Patient drinks very little caffeine.   Left handed   Social Determinants of Health   Financial Resource Strain: Low Risk  (07/24/2022)   Overall Financial Resource Strain (CARDIA)    Difficulty of Paying Living Expenses: Not hard at all  Food Insecurity: No Food Insecurity (07/24/2022)   Hunger Vital Sign    Worried About Running Out of Food in the Last Year: Never true    Ran Out of Food in the Last Year: Never true  Transportation Needs: No Transportation Needs (07/24/2022)   PRAPARE - Hydrologist (Medical): No    Lack of Transportation (Non-Medical): No  Physical Activity: Sufficiently Active (07/24/2022)   Exercise Vital Sign    Days of Exercise per Week: 6 days    Minutes of Exercise per Session: 140 min  Stress: No Stress Concern Present (07/24/2022)   [place]    Feeling of Stress : Not at all  Social Connections: Socially Integrated (07/24/2022)   Social Connection and Isolation Panel  [NHANES]    Frequency of Communication with Friends and Family: More than three times a week    Frequency of Social Gatherings with Friends and Family: Once a week    Attends Religious Services: 1 to 4 times per year    Active Member of Clubs or Organizations: Yes    Attends Music therapist: More than 4 times per year    Marital Status: Married   Past Surgical History:  Procedure Laterality Date   ANAL SPHINCTEROTOMY  04/2011   APPENDECTOMY  1980   AXILLARY LYMPH NODE DISSECTION Left 02/04/2013   Procedure: LEFT AXILLARY LYMPH NODE DISSECTION;  Surgeon: Stark Klein, MD;  Location: [place];  Service: General;  Laterality: Left;  End: 1512   BREAST LUMPECTOMY WITH NEEDLE LOCALIZATION Left 02/04/2013   Procedure: LEFT BREAST LUMPECTOMY WITH NEEDLE LOCALIZATION;  Surgeon: Stark Klein, MD;  Location: [place];  Service: General;  Laterality: Left;   BREAST SURGERY     Lumpectomy in april 2014   [place]  04/2011   ligation   MASTECTOMY Left 02/15/2017   PORT-A-CATH REMOVAL N/A 04/16/2014   Procedure: REMOVAL PORT-A-CATH;  Surgeon: Stark Klein, MD;  Location: [facility];  Service: General;  Laterality: N/A;   PORTACATH PLACEMENT Right 02/04/2013   Procedure: INSERTION PORT-A-CATH;  Surgeon: Stark Klein, MD;  Location: [place] [facility];  Service: General;  Laterality: Right;  Start Time: 4536.   SHOULDER ARTHROSCOPY WITH ROTATOR CUFF REPAIR AND SUBACROMIAL DECOMPRESSION Left 02/24/2014   Procedure: SHOULDER ARTHROSCOPY WITH ROTATOR CUFF REPAIR AND SUBACROMIAL DECOMPRESSION;  Surgeon: Meredith Pel, MD;  Location: [place];  Service: Orthopedics;  Laterality: Left;  LEFT SHOULDER DIAGNOSTIC OPERATIVE ARTHROSCOPY, SUBACROMIAL DECOMPRESSION, ROTATOR CUFF TEAR REPAIR   SIMPLE MASTECTOMY WITH AXILLARY SENTINEL NODE BIOPSY Left 02/15/2017   Procedure: LEFT MASTECTOMY;  Surgeon: Stark Klein, MD;  Location: [place];  Service: General;  Laterality: Left;   TOTAL MASTECTOMY Right 12/26/2018   Procedure:  RIGHT BREAST PROPHYLATIC MASTECTOMY;  Surgeon: Stark Klein, MD;  Location: [facility];  Service: General;  Laterality: Right;   Past Surgical History:  Procedure Laterality Date   ANAL SPHINCTEROTOMY  04/2011   APPENDECTOMY  1980   AXILLARY LYMPH NODE DISSECTION Left 02/04/2013   Procedure: LEFT AXILLARY LYMPH NODE DISSECTION;  Surgeon: Stark Klein, MD;  [facility];  Service: General;  Laterality: Left;  End: 5462   BREAST LUMPECTOMY WITH NEEDLE LOCALIZATION Left 02/04/2013   Procedure: LEFT BREAST LUMPECTOMY WITH NEEDLE LOCALIZATION;  Surgeon: Stark Klein, MD;  Location: [place];  Service: General;  Laterality: Left;   BREAST SURGERY     Lumpectomy in april 2014   [place]  04/2011   ligation   MASTECTOMY Left 02/15/2017   PORT-A-CATH REMOVAL N/A 04/16/2014   Procedure: REMOVAL PORT-A-CATH;  Surgeon: Stark Klein, MD;  Location[facility];  Service: General;  Laterality: N/A;   PORTACATH PLACEMENT Right 02/04/2013   Procedure: INSERTION PORT-A-CATH;  Surgeon: Stark Klein, MD;  Location: [place];  Service: General;  Laterality: Right;  Start Time: 7035.   SHOULDER ARTHROSCOPY WITH ROTATOR CUFF REPAIR AND SUBACROMIAL DECOMPRESSION Left 02/24/2014   Procedure: SHOULDER ARTHROSCOPY WITH ROTATOR CUFF REPAIR AND SUBACROMIAL DECOMPRESSION;  Surgeon: Meredith Pel, MD;  Location: [place];  Service: Orthopedics;  Laterality: Left;  LEFT SHOULDER DIAGNOSTIC OPERATIVE ARTHROSCOPY, SUBACROMIAL DECOMPRESSION, ROTATOR CUFF TEAR REPAIR   SIMPLE MASTECTOMY WITH AXILLARY SENTINEL NODE BIOPSY Left 02/15/2017   Procedure: LEFT MASTECTOMY;  Surgeon: Stark Klein, MD;  Location: [place];  Service: General;  Laterality: Left;   TOTAL MASTECTOMY Right 12/26/2018   Procedure: RIGHT BREAST PROPHYLATIC MASTECTOMY;  Surgeon: Stark Klein, MD;  Location: [place];  Service: General;  Laterality: Right;   Past Medical History:  Diagnosis Date   Abnormal gait 12/17/2019   Acute blood loss anemia    Anal fissure  05/03/2011   Anal pain 04/27/2021   Anemia    Iron deficinecy anemia   Anemia 05/14/2017   Anxiety and depression 05/15/2014   Arthritis    Body mass index (BMI) 30.0-30.9, adult 12/17/2019   Breast cancer ([place])    left ,last radiation 2'15, last chemo 8'14   Cervical cancer screening 06/18/2018   Menarche at 11 Regular and moderate flow  history of abnormal pap in past, 1 mild abnormality years ago that resolved spontaneously on recheck G1P1, s/p 1 svd history of abnormal MGM, b/o breast cancer 2014 No concerns today  gyn surgeries. Lumpectomy  LMP around early 2014   Cervical stenosis of spine 12/17/2019   Constipation 11/13/2016   Contracture of axilla 05/20/2013   Debility 01/18/2020   Essential (primary) hypertension 12/17/2019   Flushing 02/20/2014   Gait instability 12/17/2019   Genetic testing 02/20/2019   Negative genetic testing on the Comprehensive Cancer Panel.  The Comprehensive Common Cancer Panel offered by [facility] includes sequencing and/or deletion duplication testing of the following 46 genes: APC, ATM, AXIN2, BAP1, BARD1, BMPR1A, BRCA1, BRCA2, BRIP1, CDH1, CDK4, CDKN2A, CHEK2, EPCAM, FANCC, FH, FLCN, HOXB13, MET, MITF,  MLH1, MSH2, MSH6, MUTYH, NBN, NF1, NTHL1,  PALB2, PMS2, POLD1, POLE, P   H/O: CVA (cerebrovascular accident) 06/19/2020   Hereditary and idiopathic peripheral neuropathy 11/11/2013   Herpes zoster 05/15/2014   History of chicken pox    History of left breast cancer 02/15/2017   History of radiation therapy 09/09/13-10/28/13   45 gray to left breast, lumpectomy cavity boosted to 63 gray   HTN (hypertension) 11/13/2016   Hyperglycemia 01/09/2016   Hyperlipidemia 05/14/2017   Hypertension    Internal hemorrhoids without complication 0/07/3817   Knee pain, bilateral 11/14/2016   Left upper extremity numbness 06/19/2018   Major depressive disorder, recurrent episode, severe ([place]) 01/12/2014   Malignant neoplasm of upper-inner quadrant of female breast  ([place]) 01/24/2013    Left IDC, ER 3%, PR-, Her2neu-  Formatting of this note might be different from the original. Overview:   Left IDC, ER 3%, PR-, Her2neu-  Last Assessment & Plan:  Pt will continue to take her tamoxifen as previously directed. She appears to be tolerating this with only c/o moderate, occ hot flashes.   Mild neurocognitive disorder due to multiple etiologies 03/17/2020   MRSA (methicillin resistant Staphylococcus aureus) 2009   right groin area-no issues now. 04-07-14 PCR screen negative today.   Neuropathy    Neuropathy of hand, left 09/21/2015   Nodule of finger of left hand 11/14/2016   Palpitations 07/30/2020   Personal history of colonic polyps 04/27/2021   Preventative health care 11/13/2016   Right hip pain 11/14/2016   S/P mastectomy, right 12/26/2018   Sebaceous cyst 04/12/2013   Shoulder joint pain 12/12/2013   Spinal stenosis in cervical region 10/09/2019   Strain of rotator cuff 02/24/2014   Stroke ([place]) 06/2020   Vertigo    BP 122/70   Pulse (!) 108   Ht 5' 2"  (1.575 m)   Wt 158 lb 6.4 oz (71.8 kg)   LMP 01/20/2013   SpO2 98%   BMI 28.97 kg/m   Opioid Risk Score:   Fall Risk Score:  `1  Depression screen PHQ 2/9     08/03/2022    1:19 PM 07/24/2022    1:36 PM 06/22/2022    1:29 PM 05/11/2022    2:11 PM 03/30/2022   11:33 AM 03/28/2022    2:36 PM 03/28/2022    2:35 PM  Depression screen PHQ 2/9  Decreased Interest 0 0 0 0 0 0 0  Down, Depressed, Hopeless 0 0 0 0 0 0 0  PHQ - 2 Score 0 0 0 0 0 0 0  Altered sleeping      0   Tired, decreased energy      0   Change in appetite      0   Feeling bad or failure about yourself       0   Trouble concentrating      0   Moving slowly or fidgety/restless      0   Suicidal thoughts      0   PHQ-9 Score      0   Difficult doing work/chores      Not difficult at all       Review of Systems  Musculoskeletal:        Bilateral Hand Pain Bilateral Foot pain   All other systems reviewed and are  negative.     Objective:   Physical Exam Vitals and nursing note reviewed.  Constitutional:      Appearance: Normal appearance.  Cardiovascular:     Rate and Rhythm: Normal rate and regular rhythm.     Pulses: Normal pulses.     Heart sounds: Normal heart sounds.  Pulmonary:     Effort: Pulmonary effort is normal.     Breath sounds: Normal breath sounds.  Musculoskeletal:     Cervical back: Normal range of motion and neck supple.     Comments: Normal Muscle Bulk and Muscle Testing Reveals:  Upper Extremities: Full ROM and Muscle Strength  5/5 Left Upper Extremity: Lymphedema Lower Extremities: Full ROM and Muscle Strength 5/5 Arises from Table with ease Narrow Based  Gait     Skin:    General: Skin is warm and dry.  Neurological:     Mental Status: She is alert and oriented to person, place, and  time.  Psychiatric:        Mood and Affect: Mood normal.        Behavior: Behavior normal.         Assessment & Plan:  1. Chemotherapy Induced Peripheral Neuropathy: Continue current medication regimen with Gabapentin. Continue to Monitor. 08/03/2022 2. Chronic Pain Syndrome: Refilled: Tramadol 50 mg one tablet twice a day for breakthrough pain #60 and   Hydrocodone 10/325 mg one tablet4 times a day as needed for pain #120. We will continue the opioid monitoring program, this consists of regular clinic visits, examinations, urine drug screen, pill counts as well as use of [place] Controlled Substance Reporting system. A 12 month History has been reviewed on the [place] Controlled Substance Reporting System on 08/03/2022 3. Polyarthralgia: Continue HEP as Tolerated: Continue to Monitor. 08/03/2022   F/U in 2 months

## 2022-08-13 ENCOUNTER — Other Ambulatory Visit: Payer: Self-pay | Admitting: Cardiology

## 2022-08-13 DIAGNOSIS — I10 Essential (primary) hypertension: Secondary | ICD-10-CM

## 2022-08-17 ENCOUNTER — Other Ambulatory Visit: Payer: Self-pay | Admitting: Hematology and Oncology

## 2022-08-22 ENCOUNTER — Telehealth: Payer: Self-pay | Admitting: Registered Nurse

## 2022-08-22 MED ORDER — HYDROCODONE-ACETAMINOPHEN 10-325 MG PO TABS: 1.0000 | ORAL_TABLET | Freq: Four times a day (QID) | ORAL | 0 refills | Status: DC | PRN

## 2022-08-22 NOTE — Telephone Encounter (Signed)
PMP was Reviewed.  Hydrocodone e-scribed today. Kristi [name] is aware via My-Chart

## 2022-09-14 ENCOUNTER — Encounter: Payer: Medicare Other | Attending: Physical Medicine and Rehabilitation | Admitting: Registered Nurse

## 2022-09-14 VITALS — BP 108/70 | HR 71 | Ht 66.0 in | Wt 158.6 lb

## 2022-09-14 DIAGNOSIS — Z5181 Encounter for therapeutic drug level monitoring: Secondary | ICD-10-CM | POA: Diagnosis not present

## 2022-09-14 DIAGNOSIS — G620 Drug-induced polyneuropathy: Secondary | ICD-10-CM | POA: Insufficient documentation

## 2022-09-14 DIAGNOSIS — M2550 Pain in unspecified joint: Secondary | ICD-10-CM | POA: Insufficient documentation

## 2022-09-14 DIAGNOSIS — Z79891 Long term (current) use of opiate analgesic: Secondary | ICD-10-CM | POA: Insufficient documentation

## 2022-09-14 DIAGNOSIS — G629 Polyneuropathy, unspecified: Secondary | ICD-10-CM | POA: Insufficient documentation

## 2022-09-14 DIAGNOSIS — G894 Chronic pain syndrome: Secondary | ICD-10-CM | POA: Diagnosis not present

## 2022-09-14 DIAGNOSIS — I890 Lymphedema, not elsewhere classified: Secondary | ICD-10-CM | POA: Diagnosis not present

## 2022-09-14 DIAGNOSIS — T451X5A Adverse effect of antineoplastic and immunosuppressive drugs, initial encounter: Secondary | ICD-10-CM | POA: Diagnosis not present

## 2022-09-14 MED ORDER — HYDROCODONE-ACETAMINOPHEN 10-325 MG PO TABS: 1.0000 | ORAL_TABLET | Freq: Four times a day (QID) | ORAL | 0 refills | Status: DC | PRN

## 2022-09-14 NOTE — Progress Notes (Signed)
Subjective:    Patient ID: Kristi [name], female    DOB: 1956/09/28, 66 y.o.   MRN: [id number]  HPI: Kristi [name] is a 66 y.o. female who returns for follow up appointment for chronic pain and medication refill. She states her pain is located in her left shoulder, bilateral finger tips with tingling and burning and bilateral feet with tingling and burning. She rates her pain 5. Her current exercise regime is walking and performing stretching exercises.  Ms. Bolte Morphine equivalent is 40.00 MME.  UDS ordered today.    Pain Inventory Average Pain 5 Pain Right Now 5 My pain is constant, sharp, burning, stabbing, tingling, and aching  In the last 24 hours, has pain interfered with the following? General activity 5 Relation with others 5 Enjoyment of life 5 What TIME of day is your pain at its worst? morning , daytime, evening, and night Sleep (in general) Good  Pain is worse with: walking, inactivity, and standing Pain improves with: therapy/exercise and medication Relief from Meds: 6  Family History  Problem Relation Age of Onset   Lung cancer Father    Hypertension Father    Thyroid cancer Father        dx in his 31s   Cancer Father        lung, thyroid, smoker   Breast cancer Paternal Aunt 30   Colon cancer Paternal Aunt        dx in her 50x   Cervical cancer Paternal Aunt        dzx in her 59s   Ovarian cancer Cousin        dx in her lage 20s   Breast cancer Cousin        maternal first cousin, once removed; dx in her late 55s   Breast cancer Cousin        maternal first cousin once removed; dx in late 79s   Hypertension Mother    Diabetes Mother    Dementia Mother    Memory loss Mother    Hypertension Brother    Seizures Brother        Alcohol induced.   Alcohol abuse Brother        drinker, smoker   COPD Brother    Cancer Paternal Uncle        oral cancer   Kidney cancer Paternal Grandmother    Arthritis Daughter        back surgery   Cancer Cousin         several paternal cousins with brain cancer, leukemia, and other cancers   Cancer Sister        stomach   Social History   Socioeconomic History   Marital status: Married    Spouse name: [name]   Number of children: 1   Years of education: 12   Highest education level: Not on file  Occupational History   Occupation: INVENTORY Hotel manager: [employer]   Occupation: disability no longer working 2015  Tobacco Use   Smoking status: Never   Smokeless tobacco: Never  Vaping Use   Vaping Use: Never used  Substance and Sexual Activity   Alcohol use: No   Drug use: No   Sexual activity: Yes    Comment: lives with husband, disability/retirement. [employer], no dietary restrictions  Other Topics Concern   Not on file  Social History Narrative   Patient is married [name]) and lives at home with  her husband.   Patient has one daughter   Patient drinks very little caffeine.   Left handed   Social Determinants of Health   Financial Resource Strain: Low Risk  (07/24/2022)   Overall Financial Resource Strain (CARDIA)    Difficulty of Paying Living Expenses: Not hard at all  Food Insecurity: No Food Insecurity (07/24/2022)   Hunger Vital Sign    Worried About Running Out of Food in the Last Year: Never true    Ran Out of Food in the Last Year: Never true  Transportation Needs: No Transportation Needs (07/24/2022)   PRAPARE - Hydrologist (Medical): No    Lack of Transportation (Non-Medical): No  Physical Activity: Sufficiently Active (07/24/2022)   Exercise Vital Sign    Days of Exercise per Week: 6 days    Minutes of Exercise per Session: 140 min  Stress: No Stress Concern Present (07/24/2022)   [place]    Feeling of Stress : Not at all  Social Connections: Socially Integrated (07/24/2022)   Social Connection and Isolation Panel [NHANES]    Frequency of  Communication with Friends and Family: More than three times a week    Frequency of Social Gatherings with Friends and Family: Once a week    Attends Religious Services: 1 to 4 times per year    Active Member of Clubs or Organizations: Yes    Attends Music therapist: More than 4 times per year    Marital Status: Married   Past Surgical History:  Procedure Laterality Date   ANAL SPHINCTEROTOMY  04/2011   APPENDECTOMY  1980   AXILLARY LYMPH NODE DISSECTION Left 02/04/2013   Procedure: LEFT AXILLARY LYMPH NODE DISSECTION;  Surgeon: Stark Klein, MD;  Location: [facility];  Service: General;  Laterality: Left;  End: 1512   BREAST LUMPECTOMY WITH NEEDLE LOCALIZATION Left 02/04/2013   Procedure: LEFT BREAST LUMPECTOMY WITH NEEDLE LOCALIZATION;  Surgeon: Stark Klein, MD;  Location: [facility];  Service: General;  Laterality: Left;   BREAST SURGERY     Lumpectomy in april 2014   [place]  04/2011   ligation   MASTECTOMY Left 02/15/2017   PORT-A-CATH REMOVAL N/A 04/16/2014   Procedure: REMOVAL PORT-A-CATH;  Surgeon: Stark Klein, MD;  Location: [facility];  Service: General;  Laterality: N/A;   PORTACATH PLACEMENT Right 02/04/2013   Procedure: INSERTION PORT-A-CATH;  Surgeon: Stark Klein, MD;  Location: [facility];  Service: General;  Laterality: Right;  Start Time: 1610.   SHOULDER ARTHROSCOPY WITH ROTATOR CUFF REPAIR AND SUBACROMIAL DECOMPRESSION Left 02/24/2014   Procedure: SHOULDER ARTHROSCOPY WITH ROTATOR CUFF REPAIR AND SUBACROMIAL DECOMPRESSION;  Surgeon: Meredith Pel, MD;  Location: [facility];  Service: Orthopedics;  Laterality: Left;  LEFT SHOULDER DIAGNOSTIC OPERATIVE ARTHROSCOPY, SUBACROMIAL DECOMPRESSION, ROTATOR CUFF TEAR REPAIR   SIMPLE MASTECTOMY WITH AXILLARY SENTINEL NODE BIOPSY Left 02/15/2017   Procedure: LEFT MASTECTOMY;  Surgeon: Stark Klein, MD;  Location: [facility];  Service: General;  Laterality: Left;   TOTAL MASTECTOMY Right 12/26/2018   Procedure: RIGHT BREAST PROPHYLATIC  MASTECTOMY;  Surgeon: Stark Klein, MD;  Location: [facility];  Service: General;  Laterality: Right;   Past Surgical History:  Procedure Laterality Date   ANAL SPHINCTEROTOMY  04/2011   APPENDECTOMY  1980   AXILLARY LYMPH NODE DISSECTION Left 02/04/2013   Procedure: LEFT AXILLARY LYMPH NODE DISSECTION;  Surgeon: Stark Klein, MD;  Location: [facility];  Service: General;  Laterality: Left;  End: 1512   BREAST LUMPECTOMY WITH NEEDLE LOCALIZATION Left 02/04/2013   Procedure: LEFT BREAST LUMPECTOMY WITH NEEDLE LOCALIZATION;  Surgeon: Stark Klein, MD;  Location: [facility];  Service: General;  Laterality: Left;   BREAST SURGERY     Lumpectomy in april 2014   [place]  04/2011   ligation   MASTECTOMY Left 02/15/2017   PORT-A-CATH REMOVAL N/A 04/16/2014   Procedure: REMOVAL PORT-A-CATH;  Surgeon: Stark Klein, MD;  Location: [facility];  Service: General;  Laterality: N/A;   PORTACATH PLACEMENT Right 02/04/2013   Procedure: INSERTION PORT-A-CATH;  Surgeon: Stark Klein, MD;  Location: [facility];  Service: General;  Laterality: Right;  Start Time: 3419.   SHOULDER ARTHROSCOPY WITH ROTATOR CUFF REPAIR AND SUBACROMIAL DECOMPRESSION Left 02/24/2014   Procedure: SHOULDER ARTHROSCOPY WITH ROTATOR CUFF REPAIR AND SUBACROMIAL DECOMPRESSION;  Surgeon: Meredith Pel, MD;  Location: [facility];  Service: Orthopedics;  Laterality: Left;  LEFT SHOULDER DIAGNOSTIC OPERATIVE ARTHROSCOPY, SUBACROMIAL DECOMPRESSION, ROTATOR CUFF TEAR REPAIR   SIMPLE MASTECTOMY WITH AXILLARY SENTINEL NODE BIOPSY Left 02/15/2017   Procedure: LEFT MASTECTOMY;  Surgeon: Stark Klein, MD;  Location: [facility];  Service: General;  Laterality: Left;   TOTAL MASTECTOMY Right 12/26/2018   Procedure: RIGHT BREAST PROPHYLATIC MASTECTOMY;  Surgeon: Stark Klein, MD;  Location: [facility];  Service: General;  Laterality: Right;   Past Medical History:  Diagnosis Date   Abnormal gait 12/17/2019   Acute blood loss anemia    Anal fissure 05/03/2011   Anal pain 04/27/2021    Anemia    Iron deficinecy anemia   Anemia 05/14/2017   Anxiety and depression 05/15/2014   Arthritis    Body mass index (BMI) 30.0-30.9, adult 12/17/2019   Breast cancer ([place])    left ,last radiation 2'15, last chemo 8'14   Cervical cancer screening 06/18/2018   Menarche at 11 Regular and moderate flow  history of abnormal pap in past, 1 mild abnormality years ago that resolved spontaneously on recheck G1P1, s/p 1 svd history of abnormal MGM, b/o breast cancer 2014 No concerns today  gyn surgeries. Lumpectomy  LMP around early 2014   Cervical stenosis of spine 12/17/2019   Constipation 11/13/2016   Contracture of axilla 05/20/2013   Debility 01/18/2020   Essential (primary) hypertension 12/17/2019   Flushing 02/20/2014   Gait instability 12/17/2019   Genetic testing 02/20/2019   Negative genetic testing on the Comprehensive Cancer Panel.  The Comprehensive Common Cancer Panel offered by [facility] includes sequencing and/or deletion duplication testing of the following 46 genes: APC, ATM, AXIN2, BAP1, BARD1, BMPR1A, BRCA1, BRCA2, BRIP1, CDH1, CDK4, CDKN2A, CHEK2, EPCAM, FANCC, FH, FLCN, HOXB13, MET, MITF,  MLH1, MSH2, MSH6, MUTYH, NBN, NF1, NTHL1,  PALB2, PMS2, POLD1, POLE, P   H/O: CVA (cerebrovascular accident) 06/19/2020   Hereditary and idiopathic peripheral neuropathy 11/11/2013   Herpes zoster 05/15/2014   History of chicken pox    History of left breast cancer 02/15/2017   History of radiation therapy 09/09/13-10/28/13   45 gray to left breast, lumpectomy cavity boosted to 63 gray   HTN (hypertension) 11/13/2016   Hyperglycemia 01/09/2016   Hyperlipidemia 05/14/2017   Hypertension    Internal hemorrhoids without complication 04/27/2978   Knee pain, bilateral 11/14/2016   Left upper extremity numbness 06/19/2018   Major depressive disorder, recurrent episode, severe ([place]) 01/12/2014   Malignant neoplasm of upper-inner quadrant of female breast ([place]) 01/24/2013    Left IDC, ER  3%, PR-, Her2neu-  Formatting of this note might be different  from the original. Overview:   Left IDC, ER 3%, PR-, Her2neu-  Last Assessment & Plan:  Pt will continue to take her tamoxifen as previously directed. She appears to be tolerating this with only c/o moderate, occ hot flashes.   Mild neurocognitive disorder due to multiple etiologies 03/17/2020   MRSA (methicillin resistant Staphylococcus aureus) 2009   right groin area-no issues now. 04-07-14 PCR screen negative today.   Neuropathy    Neuropathy of hand, left 09/21/2015   Nodule of finger of left hand 11/14/2016   Palpitations 07/30/2020   Personal history of colonic polyps 04/27/2021   Preventative health care 11/13/2016   Right hip pain 11/14/2016   S/P mastectomy, right 12/26/2018   Sebaceous cyst 04/12/2013   Shoulder joint pain 12/12/2013   Spinal stenosis in cervical region 10/09/2019   Strain of rotator cuff 02/24/2014   Stroke ([place]) 06/2020   Vertigo    BP 108/70   Pulse 71   Ht _0  (1.676 m)   Wt 158 lb 9.6 oz (71.9 kg)   LMP 01/20/2013   SpO2 98%   BMI 25.60 kg/m   Opioid Risk Score:   Fall Risk Score:  `1  Depression screen PHQ 2/9     09/14/2022    1:44 PM 08/03/2022    1:19 PM 07/24/2022    1:36 PM 06/22/2022    1:29 PM 05/11/2022    2:11 PM 03/30/2022   11:33 AM 03/28/2022    2:36 PM  Depression screen PHQ 2/9  Decreased Interest 0 0 0 0 0 0 0  Down, Depressed, Hopeless 0 0 0 0 0 0 0  PHQ - 2 Score 0 0 0 0 0 0 0  Altered sleeping       0  Tired, decreased energy       0  Change in appetite       0  Feeling bad or failure about yourself        0  Trouble concentrating       0  Moving slowly or fidgety/restless       0  Suicidal thoughts       0  PHQ-9 Score       0  Difficult doing work/chores       Not difficult at all      Review of Systems  Musculoskeletal:        Right shoulder pain B/L hand pain B/L foot pain  All other systems reviewed and are negative.     Objective:   Physical  Exam Vitals and nursing note reviewed.  Constitutional:      Appearance: Normal appearance.  Cardiovascular:     Rate and Rhythm: Normal rate and regular rhythm.     Pulses: Normal pulses.     Heart sounds: Normal heart sounds.  Pulmonary:     Effort: Pulmonary effort is normal.     Breath sounds: Normal breath sounds.  Musculoskeletal:     Cervical back: Normal range of motion and neck supple.     Comments: Normal Muscle Bulk and Muscle Testing Reveals:  Upper Extremities: Full ROM and Muscle Strength 5/5  Lower Extremities : Full ROM and Muscle Strength 5/5 Arises from Table with ease Narrow Based Gait     Skin:    General: Skin is warm and dry.  Neurological:     Mental Status: She is alert and oriented to person, place, and time.  Psychiatric:  Mood and Affect: Mood normal.        Behavior: Behavior normal.         Assessment & Plan:  1. Chemotherapy Induced Peripheral Neuropathy: Continue current medication regimen with Gabapentin. Continue to Monitor. 09/14/2022 2. Chronic Pain Syndrome: Refilled: Tramadol 50 mg one tablet twice a day for breakthrough pain #60 and   Hydrocodone 10/325 mg one tablet4 times a day as needed for pain #120. We will continue the opioid monitoring program, this consists of regular clinic visits, examinations, urine drug screen, pill counts as well as use of [place] Controlled Substance Reporting system. A 12 month History has been reviewed on the [place] Controlled Substance Reporting System on 09/14/2022 3. Polyarthralgia: Continue HEP as Tolerated: Continue to Monitor. 09/14/2022   F/U in 1 months

## 2022-09-18 ENCOUNTER — Encounter: Payer: Self-pay | Admitting: Registered Nurse

## 2022-09-19 LAB — TOXASSURE SELECT,+ANTIDEPR,UR

## 2022-09-21 ENCOUNTER — Telehealth: Payer: Self-pay | Admitting: *Deleted

## 2022-09-21 NOTE — Telephone Encounter (Signed)
Urine drug screen for this encounter is consistent for prescribed medication 

## 2022-10-05 ENCOUNTER — Ambulatory Visit: Payer: Medicare Other | Admitting: Family Medicine

## 2022-10-13 ENCOUNTER — Telehealth: Payer: Self-pay

## 2022-10-13 MED ORDER — GABAPENTIN 600 MG PO TABS: 600.0000 mg | ORAL_TABLET | Freq: Two times a day (BID) | ORAL | 3 refills | Status: DC

## 2022-10-13 MED ORDER — GABAPENTIN 300 MG PO CAPS: 900.0000 mg | ORAL_CAPSULE | Freq: Every day | ORAL | 3 refills | Status: DC

## 2022-10-13 NOTE — Telephone Encounter (Signed)
Refill sent.

## 2022-10-19 ENCOUNTER — Encounter: Payer: Self-pay | Admitting: Registered Nurse

## 2022-10-19 ENCOUNTER — Encounter: Payer: Medicare Other | Attending: Physical Medicine and Rehabilitation | Admitting: Registered Nurse

## 2022-10-19 VITALS — BP 130/80 | HR 58 | Ht 66.0 in | Wt 160.0 lb

## 2022-10-19 DIAGNOSIS — Z79891 Long term (current) use of opiate analgesic: Secondary | ICD-10-CM | POA: Diagnosis not present

## 2022-10-19 DIAGNOSIS — R001 Bradycardia, unspecified: Secondary | ICD-10-CM

## 2022-10-19 DIAGNOSIS — Z5181 Encounter for therapeutic drug level monitoring: Secondary | ICD-10-CM | POA: Diagnosis not present

## 2022-10-19 DIAGNOSIS — G894 Chronic pain syndrome: Secondary | ICD-10-CM | POA: Diagnosis not present

## 2022-10-19 DIAGNOSIS — M2550 Pain in unspecified joint: Secondary | ICD-10-CM

## 2022-10-19 DIAGNOSIS — G620 Drug-induced polyneuropathy: Secondary | ICD-10-CM | POA: Diagnosis not present

## 2022-10-19 DIAGNOSIS — T451X5A Adverse effect of antineoplastic and immunosuppressive drugs, initial encounter: Secondary | ICD-10-CM | POA: Diagnosis not present

## 2022-10-19 DIAGNOSIS — I890 Lymphedema, not elsewhere classified: Secondary | ICD-10-CM

## 2022-10-19 MED ORDER — HYDROCODONE-ACETAMINOPHEN 10-325 MG PO TABS: 1.0000 | ORAL_TABLET | Freq: Four times a day (QID) | ORAL | 0 refills | Status: DC | PRN

## 2022-10-19 MED ORDER — TRAMADOL HCL 50 MG PO TABS: 50.0000 mg | ORAL_TABLET | Freq: Two times a day (BID) | ORAL | 1 refills | Status: DC | PRN

## 2022-10-19 NOTE — Progress Notes (Signed)
Subjective:    Patient ID: Kristi [name], female    DOB: Aug 16, 1956, 66 y.o.   MRN: [id number]  HPI: NIAOMI [name] is a 66 y.o. female who returns for follow up appointment for chronic pain and medication refill. She states her  pain is located in her Bilateral feet with tingling and burning. She rates her pain 6. Her current exercise regime is walking and performing stretching exercises.  [name] Morphine equivalent is 40.00 MME.   Last UDS was Performed on 09/14/2022, it was consistent.     Pain Inventory Average Pain 5 Pain Right Now 6 My pain is sharp, burning, tingling, and aching  In the last 24 hours, has pain interfered with the following? General activity 5 Relation with others 4 Enjoyment of life 5 What TIME of day is your pain at its worst? morning , daytime, and evening Sleep (in general) Fair  Pain is worse with: walking, sitting, and standing Pain improves with: therapy/exercise and medication Relief from Meds: 5  Family History  Problem Relation Age of Onset   Lung cancer Father    Hypertension Father    Thyroid cancer Father        dx in his 51s   Cancer Father        lung, thyroid, smoker   Breast cancer Paternal Aunt 77   Colon cancer Paternal Aunt        dx in her 50x   Cervical cancer Paternal Aunt        dzx in her 48s   Ovarian cancer Cousin        dx in her lage 58s   Breast cancer Cousin        maternal first cousin, once removed; dx in her late 1s   Breast cancer Cousin        maternal first cousin once removed; dx in late 41s   Hypertension Mother    Diabetes Mother    Dementia Mother    Memory loss Mother    Hypertension Brother    Seizures Brother        Alcohol induced.   Alcohol abuse Brother        drinker, smoker   COPD Brother    Cancer Paternal Uncle        oral cancer   Kidney cancer Paternal Grandmother    Arthritis Daughter        back surgery   Cancer Cousin        several paternal cousins with brain cancer,  leukemia, and other cancers   Cancer Sister        stomach   Social History   Socioeconomic History   Marital status: Married    Spouse name: [name]   Number of children: 1   Years of education: 12   Highest education level: Not on file  Occupational History   Occupation: INVENTORY Hotel manager: [employer]   Occupation: disability no longer working 2015  Tobacco Use   Smoking status: Never   Smokeless tobacco: Never  Vaping Use   Vaping Use: Never used  Substance and Sexual Activity   Alcohol use: No   Drug use: No   Sexual activity: Yes    Comment: lives with husband, disability/retirement. [employer], no dietary restrictions  Other Topics Concern   Not on file  Social History Narrative   Patient is married [name]) and lives at home with her husband.  Patient has one daughter   Patient drinks very little caffeine.   Left handed   Social Determinants of Health   Financial Resource Strain: Low Risk  (07/24/2022)   Overall Financial Resource Strain (CARDIA)    Difficulty of Paying Living Expenses: Not hard at all  Food Insecurity: No Food Insecurity (07/24/2022)   Hunger Vital Sign    Worried About Running Out of Food in the Last Year: Never true    Ran Out of Food in the Last Year: Never true  Transportation Needs: No Transportation Needs (07/24/2022)   PRAPARE - Hydrologist (Medical): No    Lack of Transportation (Non-Medical): No  Physical Activity: Sufficiently Active (07/24/2022)   Exercise Vital Sign    Days of Exercise per Week: 6 days    Minutes of Exercise per Session: 140 min  Stress: No Stress Concern Present (07/24/2022)   [place]    Feeling of Stress : Not at all  Social Connections: Socially Integrated (07/24/2022)   Social Connection and Isolation Panel [NHANES]    Frequency of Communication with Friends and Family: More than  three times a week    Frequency of Social Gatherings with Friends and Family: Once a week    Attends Religious Services: 1 to 4 times per year    Active Member of Clubs or Organizations: Yes    Attends Music therapist: More than 4 times per year    Marital Status: Married   Past Surgical History:  Procedure Laterality Date   ANAL SPHINCTEROTOMY  04/2011   APPENDECTOMY  1980   AXILLARY LYMPH NODE DISSECTION Left 02/04/2013   Procedure: LEFT AXILLARY LYMPH NODE DISSECTION;  Surgeon: Stark Klein, MD;  Location: [facility];  Service: General;  Laterality: Left;  End: 1512   BREAST LUMPECTOMY WITH NEEDLE LOCALIZATION Left 02/04/2013   Procedure: LEFT BREAST LUMPECTOMY WITH NEEDLE LOCALIZATION;  Surgeon: Stark Klein, MD;  Location: [facility];  Service: General;  Laterality: Left;   BREAST SURGERY     Lumpectomy in april 2014   [place]  04/2011   ligation   MASTECTOMY Left 02/15/2017   PORT-A-CATH REMOVAL N/A 04/16/2014   Procedure: REMOVAL PORT-A-CATH;  Surgeon: Stark Klein, MD;  Location: [facility];  Service: General;  Laterality: N/A;   PORTACATH PLACEMENT Right 02/04/2013   Procedure: INSERTION PORT-A-CATH;  Surgeon: Stark Klein, MD;  Location: [facility];  Service: General;  Laterality: Right;  Start Time: 7915.   SHOULDER ARTHROSCOPY WITH ROTATOR CUFF REPAIR AND SUBACROMIAL DECOMPRESSION Left 02/24/2014   Procedure: SHOULDER ARTHROSCOPY WITH ROTATOR CUFF REPAIR AND SUBACROMIAL DECOMPRESSION;  Surgeon: Meredith Pel, MD;  Location: [facility];  Service: Orthopedics;  Laterality: Left;  LEFT SHOULDER DIAGNOSTIC OPERATIVE ARTHROSCOPY, SUBACROMIAL DECOMPRESSION, ROTATOR CUFF TEAR REPAIR   SIMPLE MASTECTOMY WITH AXILLARY SENTINEL NODE BIOPSY Left 02/15/2017   Procedure: LEFT MASTECTOMY;  Surgeon: Stark Klein, MD;  Location: [facility];  Service: General;  Laterality: Left;   TOTAL MASTECTOMY Right 12/26/2018   Procedure: RIGHT BREAST PROPHYLATIC MASTECTOMY;  Surgeon: Stark Klein, MD;  Location:  [facility];  Service: General;  Laterality: Right;   Past Surgical History:  Procedure Laterality Date   ANAL SPHINCTEROTOMY  04/2011   APPENDECTOMY  1980   AXILLARY LYMPH NODE DISSECTION Left 02/04/2013   Procedure: LEFT AXILLARY LYMPH NODE DISSECTION;  Surgeon: Stark Klein, MD;  Location: [facility];  Service: General;  Laterality: Left;  End: 1512  BREAST LUMPECTOMY WITH NEEDLE LOCALIZATION Left 02/04/2013   Procedure: LEFT BREAST LUMPECTOMY WITH NEEDLE LOCALIZATION;  Surgeon: Stark Klein, MD;  Location: [facility];  Service: General;  Laterality: Left;   BREAST SURGERY     Lumpectomy in april 2014   [place]  04/2011   ligation   MASTECTOMY Left 02/15/2017   PORT-A-CATH REMOVAL N/A 04/16/2014   Procedure: REMOVAL PORT-A-CATH;  Surgeon: Stark Klein, MD;  Location[facility];  Service: General;  Laterality: N/A;   PORTACATH PLACEMENT Right 02/04/2013   Procedure: INSERTION PORT-A-CATH;  Surgeon: Stark Klein, MD;  Location: [facility];  Service: General;  Laterality: Right;  Start Time: 5638.   SHOULDER ARTHROSCOPY WITH ROTATOR CUFF REPAIR AND SUBACROMIAL DECOMPRESSION Left 02/24/2014   Procedure: SHOULDER ARTHROSCOPY WITH ROTATOR CUFF REPAIR AND SUBACROMIAL DECOMPRESSION;  Surgeon: Meredith Pel, MD;  Location: [facility];  Service: Orthopedics;  Laterality: Left;  LEFT SHOULDER DIAGNOSTIC OPERATIVE ARTHROSCOPY, SUBACROMIAL DECOMPRESSION, ROTATOR CUFF TEAR REPAIR   SIMPLE MASTECTOMY WITH AXILLARY SENTINEL NODE BIOPSY Left 02/15/2017   Procedure: LEFT MASTECTOMY;  Surgeon: Stark Klein, MD;  Location: [facility];  Service: General;  Laterality: Left;   TOTAL MASTECTOMY Right 12/26/2018   Procedure: RIGHT BREAST PROPHYLATIC MASTECTOMY;  Surgeon: Stark Klein, MD;  Location: [facility];  Service: General;  Laterality: Right;   Past Medical History:  Diagnosis Date   Abnormal gait 12/17/2019   Acute blood loss anemia    Anal fissure 05/03/2011   Anal pain 04/27/2021   Anemia    Iron deficinecy anemia   Anemia  05/14/2017   Anxiety and depression 05/15/2014   Arthritis    Body mass index (BMI) 30.0-30.9, adult 12/17/2019   Breast cancer ([place])    left ,last radiation 2'15, last chemo 8'14   Cervical cancer screening 06/18/2018   Menarche at 11 Regular and moderate flow  history of abnormal pap in past, 1 mild abnormality years ago that resolved spontaneously on recheck G1P1, s/p 1 svd history of abnormal MGM, b/o breast cancer 2014 No concerns today  gyn surgeries. Lumpectomy  LMP around early 2014   Cervical stenosis of spine 12/17/2019   Constipation 11/13/2016   Contracture of axilla 05/20/2013   Debility 01/18/2020   Essential (primary) hypertension 12/17/2019   Flushing 02/20/2014   Gait instability 12/17/2019   Genetic testing 02/20/2019   Negative genetic testing on the Comprehensive Cancer Panel.  The Comprehensive Common Cancer Panel offered by [facility] includes sequencing and/or deletion duplication testing of the following 46 genes: APC, ATM, AXIN2, BAP1, BARD1, BMPR1A, BRCA1, BRCA2, BRIP1, CDH1, CDK4, CDKN2A, CHEK2, EPCAM, FANCC, FH, FLCN, HOXB13, MET, MITF,  MLH1, MSH2, MSH6, MUTYH, NBN, NF1, NTHL1,  PALB2, PMS2, POLD1, POLE, P   H/O: CVA (cerebrovascular accident) 06/19/2020   Hereditary and idiopathic peripheral neuropathy 11/11/2013   Herpes zoster 05/15/2014   History of chicken pox    History of left breast cancer 02/15/2017   History of radiation therapy 09/09/13-10/28/13   45 gray to left breast, lumpectomy cavity boosted to 63 gray   HTN (hypertension) 11/13/2016   Hyperglycemia 01/09/2016   Hyperlipidemia 05/14/2017   Hypertension    Internal hemorrhoids without complication 7/56/4332   Knee pain, bilateral 11/14/2016   Left upper extremity numbness 06/19/2018   Major depressive disorder, recurrent episode, severe ([place]) 01/12/2014   Malignant neoplasm of upper-inner quadrant of female breast ([place]) 01/24/2013    Left IDC, ER 3%, PR-, Her2neu-  Formatting of this note might  be different from the original. Overview:  Left IDC, ER 3%, PR-, Her2neu-  Last Assessment & Plan:  Pt will continue to take her tamoxifen as previously directed. She appears to be tolerating this with only c/o moderate, occ hot flashes.   Mild neurocognitive disorder due to multiple etiologies 03/17/2020   MRSA (methicillin resistant Staphylococcus aureus) 2009   right groin area-no issues now. 04-07-14 PCR screen negative today.   Neuropathy    Neuropathy of hand, left 09/21/2015   Nodule of finger of left hand 11/14/2016   Palpitations 07/30/2020   Personal history of colonic polyps 04/27/2021   Preventative health care 11/13/2016   Right hip pain 11/14/2016   S/P mastectomy, right 12/26/2018   Sebaceous cyst 04/12/2013   Shoulder joint pain 12/12/2013   Spinal stenosis in cervical region 10/09/2019   Strain of rotator cuff 02/24/2014   Stroke ([place]) 06/2020   Vertigo    BP 130/80   Pulse (!) 58   Ht _0  (1.676 m)   Wt 160 lb (72.6 kg)   LMP 01/20/2013   SpO2 98%   BMI 25.82 kg/m   Opioid Risk Score:   Fall Risk Score:  `1  Depression screen PHQ 2/9     09/14/2022    1:44 PM 08/03/2022    1:19 PM 07/24/2022    1:36 PM 06/22/2022    1:29 PM 05/11/2022    2:11 PM 03/30/2022   11:33 AM 03/28/2022    2:36 PM  Depression screen PHQ 2/9  Decreased Interest 0 0 0 0 0 0 0  Down, Depressed, Hopeless 0 0 0 0 0 0 0  PHQ - 2 Score 0 0 0 0 0 0 0  Altered sleeping       0  Tired, decreased energy       0  Change in appetite       0  Feeling bad or failure about yourself        0  Trouble concentrating       0  Moving slowly or fidgety/restless       0  Suicidal thoughts       0  PHQ-9 Score       0  Difficult doing work/chores       Not difficult at all     Review of Systems  Musculoskeletal:        Left hand pain Bilateral feet pain  All other systems reviewed and are negative.     Objective:   Physical Exam Vitals and nursing note reviewed.  Constitutional:       Appearance: Normal appearance.  Cardiovascular:     Rate and Rhythm: Normal rate and regular rhythm.     Pulses: Normal pulses.     Heart sounds: Normal heart sounds.  Musculoskeletal:     Cervical back: Normal range of motion and neck supple.     Comments: Normal Muscle Bulk and Muscle Testing Reveals:  Upper Extremities: Full ROM and Muscle Strength 5/5  Lower Extremities: Full ROM and Muscle Strength 5/5 Arises from Table with ease Narrow Based  Gait     Skin:    General: Skin is warm and dry.  Neurological:     Mental Status: She is alert and oriented to person, place, and time.  Psychiatric:        Mood and Affect: Mood normal.        Behavior: Behavior normal.         Assessment & Plan:  1. Chemotherapy Induced Peripheral  Neuropathy: Continue current medication regimen with Gabapentin. Continue to Monitor. 10/19/2022 2. Chronic Pain Syndrome: Refilled: Tramadol 50 mg one tablet twice a day for breakthrough pain #60 and   Hydrocodone 10/325 mg one tablet4 times a day as needed for pain #120. We will continue the opioid monitoring program, this consists of regular clinic visits, examinations, urine drug screen, pill counts as well as use of [place] Controlled Substance Reporting system. A 12 month History has been reviewed on the [place] Controlled Substance Reporting System on 10/19/2022 3. Polyarthralgia: Continue HEP as Tolerated: Continue to Monitor. 10/19/2022 4. Bradycardia: arrived with pulse 58, she is currently on Diovan. She will keep a vital log and follow up with her PCP. She verbalizes understanding.   F/U in 1 months

## 2022-11-05 ENCOUNTER — Other Ambulatory Visit: Payer: Self-pay | Admitting: Cardiology

## 2022-11-05 DIAGNOSIS — I10 Essential (primary) hypertension: Secondary | ICD-10-CM

## 2022-11-10 ENCOUNTER — Telehealth: Payer: Self-pay | Admitting: Registered Nurse

## 2022-11-10 MED ORDER — GABAPENTIN 600 MG PO TABS: 600.0000 mg | ORAL_TABLET | Freq: Two times a day (BID) | ORAL | 3 refills | Status: DC

## 2022-11-10 MED ORDER — GABAPENTIN 300 MG PO CAPS: 900.0000 mg | ORAL_CAPSULE | Freq: Every day | ORAL | 3 refills | Status: DC

## 2022-11-10 NOTE — Telephone Encounter (Signed)
Gabapentin e-scribed today.  Ms. Coxe is aware via My-Chart.

## 2022-11-26 ENCOUNTER — Other Ambulatory Visit: Payer: Self-pay | Admitting: Family Medicine

## 2022-11-29 ENCOUNTER — Other Ambulatory Visit: Payer: Self-pay | Admitting: Cardiology

## 2022-11-29 DIAGNOSIS — I10 Essential (primary) hypertension: Secondary | ICD-10-CM

## 2022-11-30 ENCOUNTER — Encounter: Payer: Self-pay | Admitting: Registered Nurse

## 2022-11-30 ENCOUNTER — Encounter: Payer: Medicare Other | Attending: Physical Medicine and Rehabilitation | Admitting: Registered Nurse

## 2022-11-30 VITALS — BP 118/75 | HR 68 | Ht 66.0 in | Wt 160.0 lb

## 2022-11-30 DIAGNOSIS — Z79891 Long term (current) use of opiate analgesic: Secondary | ICD-10-CM

## 2022-11-30 DIAGNOSIS — G894 Chronic pain syndrome: Secondary | ICD-10-CM | POA: Diagnosis not present

## 2022-11-30 DIAGNOSIS — G620 Drug-induced polyneuropathy: Secondary | ICD-10-CM | POA: Diagnosis not present

## 2022-11-30 DIAGNOSIS — Z5181 Encounter for therapeutic drug level monitoring: Secondary | ICD-10-CM | POA: Diagnosis not present

## 2022-11-30 DIAGNOSIS — I890 Lymphedema, not elsewhere classified: Secondary | ICD-10-CM | POA: Diagnosis not present

## 2022-11-30 DIAGNOSIS — T451X5A Adverse effect of antineoplastic and immunosuppressive drugs, initial encounter: Secondary | ICD-10-CM

## 2022-11-30 DIAGNOSIS — M2550 Pain in unspecified joint: Secondary | ICD-10-CM | POA: Diagnosis not present

## 2022-11-30 MED ORDER — TRAMADOL HCL 50 MG PO TABS: 50.0000 mg | ORAL_TABLET | Freq: Two times a day (BID) | ORAL | 1 refills | Status: DC | PRN

## 2022-11-30 MED ORDER — HYDROCODONE-ACETAMINOPHEN 10-325 MG PO TABS: 1.0000 | ORAL_TABLET | Freq: Four times a day (QID) | ORAL | 0 refills | Status: DC | PRN

## 2022-11-30 NOTE — Progress Notes (Signed)
Subjective:    Patient ID: Kristi [name], female    DOB: Apr 21, 1956, 67 y.o.   MRN: [id number]  HPI: Kristi [name] is a 67 y.o. female who returns for follow up appointment for chronic pain and medication refill. She states her pain is located in her lefthand with tingling and bilateral feet with tingling and burning. She rates her pain 4. Her current exercise regime is walking and performing stretching exercises.  Ms. [place] Morphine equivalent is  40.67 MME.   Last UDS was Performed on 09/15/2023, it was consistent.    Pain Inventory Average Pain 5 Pain Right Now 4 My pain is intermittent, constant, sharp, stabbing, and tingling  In the last 24 hours, has pain interfered with the following? General activity 4 Relation with others 4 Enjoyment of life 4 What TIME of day is your pain at its worst? morning , daytime, and night Sleep (in general) Good  Pain is worse with: walking, sitting, inactivity, and standing Pain improves with: rest and medication Relief from Meds: 5  Family History  Problem Relation Age of Onset   Lung cancer Father    Hypertension Father    Thyroid cancer Father        dx in his 57s   Cancer Father        lung, thyroid, smoker   Breast cancer Paternal Aunt 83   Colon cancer Paternal Aunt        dx in her 50x   Cervical cancer Paternal Aunt        dzx in her 33s   Ovarian cancer Cousin        dx in her lage 58s   Breast cancer Cousin        maternal first cousin, once removed; dx in her late 10s   Breast cancer Cousin        maternal first cousin once removed; dx in late 42s   Hypertension Mother    Diabetes Mother    Dementia Mother    Memory loss Mother    Hypertension Brother    Seizures Brother        Alcohol induced.   Alcohol abuse Brother        drinker, smoker   COPD Brother    Cancer Paternal Uncle        oral cancer   Kidney cancer Paternal Grandmother    Arthritis Daughter        back surgery   Cancer Cousin        several  paternal cousins with brain cancer, leukemia, and other cancers   Cancer Sister        stomach   Social History   Socioeconomic History   Marital status: Married    Spouse name: [name]   Number of children: 1   Years of education: 12   Highest education level: Not on file  Occupational History   Occupation: INVENTORY Hotel manager: [employer]   Occupation: disability no longer working 2015  Tobacco Use   Smoking status: Never   Smokeless tobacco: Never  Vaping Use   Vaping Use: Never used  Substance and Sexual Activity   Alcohol use: No   Drug use: No   Sexual activity: Yes    Comment: lives with husband, disability/retirement. [employer], no dietary restrictions  Other Topics Concern   Not on file  Social History Narrative   Patient is married [name]) and lives at home  with her husband.   Patient has one daughter   Patient drinks very little caffeine.   Left handed   Social Determinants of Health   Financial Resource Strain: Low Risk  (07/24/2022)   Overall Financial Resource Strain (CARDIA)    Difficulty of Paying Living Expenses: Not hard at all  Food Insecurity: No Food Insecurity (07/24/2022)   Hunger Vital Sign    Worried About Running Out of Food in the Last Year: Never true    Ran Out of Food in the Last Year: Never true  Transportation Needs: No Transportation Needs (07/24/2022)   PRAPARE - Hydrologist (Medical): No    Lack of Transportation (Non-Medical): No  Physical Activity: Sufficiently Active (07/24/2022)   Exercise Vital Sign    Days of Exercise per Week: 6 days    Minutes of Exercise per Session: 140 min  Stress: No Stress Concern Present (07/24/2022)   [place]    Feeling of Stress : Not at all  Social Connections: Socially Integrated (07/24/2022)   Social Connection and Isolation Panel [NHANES]    Frequency of Communication  with Friends and Family: More than three times a week    Frequency of Social Gatherings with Friends and Family: Once a week    Attends Religious Services: 1 to 4 times per year    Active Member of Clubs or Organizations: Yes    Attends Music therapist: More than 4 times per year    Marital Status: Married   Past Surgical History:  Procedure Laterality Date   ANAL SPHINCTEROTOMY  04/2011   APPENDECTOMY  1980   AXILLARY LYMPH NODE DISSECTION Left 02/04/2013   Procedure: LEFT AXILLARY LYMPH NODE DISSECTION;  Surgeon: Stark Klein, MD;  Location: [facility];  Service: General;  Laterality: Left;  End: 1512   BREAST LUMPECTOMY WITH NEEDLE LOCALIZATION Left 02/04/2013   Procedure: LEFT BREAST LUMPECTOMY WITH NEEDLE LOCALIZATION;  Surgeon: Stark Klein, MD;  Location: [facility];  Service: General;  Laterality: Left;   BREAST SURGERY     Lumpectomy in april 2014   [place]  04/2011   ligation   MASTECTOMY Left 02/15/2017   PORT-A-CATH REMOVAL N/A 04/16/2014   Procedure: REMOVAL PORT-A-CATH;  Surgeon: Stark Klein, MD;  Location: [facility];  Service: General;  Laterality: N/A;   PORTACATH PLACEMENT Right 02/04/2013   Procedure: INSERTION PORT-A-CATH;  Surgeon: Stark Klein, MD;  Location: [facility];  Service: General;  Laterality: Right;  Start Time: 6378.   SHOULDER ARTHROSCOPY WITH ROTATOR CUFF REPAIR AND SUBACROMIAL DECOMPRESSION Left 02/24/2014   Procedure: SHOULDER ARTHROSCOPY WITH ROTATOR CUFF REPAIR AND SUBACROMIAL DECOMPRESSION;  Surgeon: Meredith Pel, MD;  Location: [facility];  Service: Orthopedics;  Laterality: Left;  LEFT SHOULDER DIAGNOSTIC OPERATIVE ARTHROSCOPY, SUBACROMIAL DECOMPRESSION, ROTATOR CUFF TEAR REPAIR   SIMPLE MASTECTOMY WITH AXILLARY SENTINEL NODE BIOPSY Left 02/15/2017   Procedure: LEFT MASTECTOMY;  Surgeon: Stark Klein, MD;  Location: [facility];  Service: General;  Laterality: Left;   TOTAL MASTECTOMY Right 12/26/2018   Procedure: RIGHT BREAST PROPHYLATIC MASTECTOMY;   Surgeon: Stark Klein, MD;  Location: [facility];  Service: General;  Laterality: Right;   Past Surgical History:  Procedure Laterality Date   ANAL SPHINCTEROTOMY  04/2011   APPENDECTOMY  1980   AXILLARY LYMPH NODE DISSECTION Left 02/04/2013   Procedure: LEFT AXILLARY LYMPH NODE DISSECTION;  Surgeon: Stark Klein, MD;  Location: [facility];  Service: General;  Laterality:  Left;  End: 1512   BREAST LUMPECTOMY WITH NEEDLE LOCALIZATION Left 02/04/2013   Procedure: LEFT BREAST LUMPECTOMY WITH NEEDLE LOCALIZATION;  Surgeon: Stark Klein, MD;  Location: [facility];  Service: General;  Laterality: Left;   BREAST SURGERY     Lumpectomy in april 2014   [place]  04/2011   ligation   MASTECTOMY Left 02/15/2017   PORT-A-CATH REMOVAL N/A 04/16/2014   Procedure: REMOVAL PORT-A-CATH;  Surgeon: Stark Klein, MD;  Location[facility];  Service: General;  Laterality: N/A;   PORTACATH PLACEMENT Right 02/04/2013   Procedure: INSERTION PORT-A-CATH;  Surgeon: Stark Klein, MD;  Location: [facility];  Service: General;  Laterality: Right;  Start Time: 1610.   SHOULDER ARTHROSCOPY WITH ROTATOR CUFF REPAIR AND SUBACROMIAL DECOMPRESSION Left 02/24/2014   Procedure: SHOULDER ARTHROSCOPY WITH ROTATOR CUFF REPAIR AND SUBACROMIAL DECOMPRESSION;  Surgeon: Meredith Pel, MD;  Location: [facility];  Service: Orthopedics;  Laterality: Left;  LEFT SHOULDER DIAGNOSTIC OPERATIVE ARTHROSCOPY, SUBACROMIAL DECOMPRESSION, ROTATOR CUFF TEAR REPAIR   SIMPLE MASTECTOMY WITH AXILLARY SENTINEL NODE BIOPSY Left 02/15/2017   Procedure: LEFT MASTECTOMY;  Surgeon: Stark Klein, MD;  Location: [facility];  Service: General;  Laterality: Left;   TOTAL MASTECTOMY Right 12/26/2018   Procedure: RIGHT BREAST PROPHYLATIC MASTECTOMY;  Surgeon: Stark Klein, MD;  Location: [facility];  Service: General;  Laterality: Right;   Past Medical History:  Diagnosis Date   Abnormal gait 12/17/2019   Acute blood loss anemia    Anal fissure 05/03/2011   Anal pain 04/27/2021   Anemia     Iron deficinecy anemia   Anemia 05/14/2017   Anxiety and depression 05/15/2014   Arthritis    Body mass index (BMI) 30.0-30.9, adult 12/17/2019   Breast cancer ([facility])    left ,last radiation 2'15, last chemo 8'14   Cervical cancer screening 06/18/2018   Menarche at 11 Regular and moderate flow  history of abnormal pap in past, 1 mild abnormality years ago that resolved spontaneously on recheck G1P1, s/p 1 svd history of abnormal MGM, b/o breast cancer 2014 No concerns today  gyn surgeries. Lumpectomy  LMP around early 2014   Cervical stenosis of spine 12/17/2019   Constipation 11/13/2016   Contracture of axilla 05/20/2013   Debility 01/18/2020   Essential (primary) hypertension 12/17/2019   Flushing 02/20/2014   Gait instability 12/17/2019   Genetic testing 02/20/2019   Negative genetic testing on the Comprehensive Cancer Panel.  The Comprehensive Common Cancer Panel offered by [facility] includes sequencing and/or deletion duplication testing of the following 46 genes: APC, ATM, AXIN2, BAP1, BARD1, BMPR1A, BRCA1, BRCA2, BRIP1, CDH1, CDK4, CDKN2A, CHEK2, EPCAM, FANCC, FH, FLCN, HOXB13, MET, MITF,  MLH1, MSH2, MSH6, MUTYH, NBN, NF1, NTHL1,  PALB2, PMS2, POLD1, POLE, P   H/O: CVA (cerebrovascular accident) 06/19/2020   Hereditary and idiopathic peripheral neuropathy 11/11/2013   Herpes zoster 05/15/2014   History of chicken pox    History of left breast cancer 02/15/2017   History of radiation therapy 09/09/13-10/28/13   45 gray to left breast, lumpectomy cavity boosted to 63 gray   HTN (hypertension) 11/13/2016   Hyperglycemia 01/09/2016   Hyperlipidemia 05/14/2017   Hypertension    Internal hemorrhoids without complication 9/60/4540   Knee pain, bilateral 11/14/2016   Left upper extremity numbness 06/19/2018   Major depressive disorder, recurrent episode, severe ([place]) 01/12/2014   Malignant neoplasm of upper-inner quadrant of female breast ([facility] [facility]) 01/24/2013    Left IDC, ER 3%, PR-,  Her2neu-  Formatting of this note might  be different from the original. Overview:   Left IDC, ER 3%, PR-, Her2neu-  Last Assessment & Plan:  Pt will continue to take her tamoxifen as previously directed. She appears to be tolerating this with only c/o moderate, occ hot flashes.   Mild neurocognitive disorder due to multiple etiologies 03/17/2020   MRSA (methicillin resistant Staphylococcus aureus) 2009   right groin area-no issues now. 04-07-14 PCR screen negative today.   Neuropathy    Neuropathy of hand, left 09/21/2015   Nodule of finger of left hand 11/14/2016   Palpitations 07/30/2020   Personal history of colonic polyps 04/27/2021   Preventative health care 11/13/2016   Right hip pain 11/14/2016   S/P mastectomy, right 12/26/2018   Sebaceous cyst 04/12/2013   Shoulder joint pain 12/12/2013   Spinal stenosis in cervical region 10/09/2019   Strain of rotator cuff 02/24/2014   Stroke ([facility]) 06/2020   Vertigo    LMP 01/20/2013   Opioid Risk Score:   Fall Risk Score:  `1  Depression screen PHQ 2/9     09/14/2022    1:44 PM 08/03/2022    1:19 PM 07/24/2022    1:36 PM 06/22/2022    1:29 PM 05/11/2022    2:11 PM 03/30/2022   11:33 AM 03/28/2022    2:36 PM  Depression screen PHQ 2/9  Decreased Interest 0 0 0 0 0 0 0  Down, Depressed, Hopeless 0 0 0 0 0 0 0  PHQ - 2 Score 0 0 0 0 0 0 0  Altered sleeping       0  Tired, decreased energy       0  Change in appetite       0  Feeling bad or failure about yourself        0  Trouble concentrating       0  Moving slowly or fidgety/restless       0  Suicidal thoughts       0  PHQ-9 Score       0  Difficult doing work/chores       Not difficult at all    Review of Systems  Musculoskeletal:        Pain in the left shoulder, left wrist & both feet  All other systems reviewed and are negative.      Objective:   Physical Exam Vitals and nursing note reviewed.  Constitutional:      Appearance: Normal appearance.  Cardiovascular:      Rate and Rhythm: Normal rate and regular rhythm.     Pulses: Normal pulses.     Heart sounds: Normal heart sounds.  Pulmonary:     Effort: Pulmonary effort is normal.     Breath sounds: Normal breath sounds.  Musculoskeletal:     Cervical back: Normal range of motion and neck supple.     Comments: Normal Muscle Bulk and Muscle Testing Reveals:  Upper Extremities: Full ROM and Muscle Strength 5/5 Lower Extremities: Full ROM and Muscle Strength 5/5 Arises from Table with ease Narrow Based  Gait     Skin:    General: Skin is warm and dry.  Neurological:     Mental Status: She is alert and oriented to person, place, and time.  Psychiatric:        Mood and Affect: Mood normal.        Behavior: Behavior normal.         Assessment & Plan:  1. Chemotherapy Induced Peripheral Neuropathy:  Continue current medication regimen with Gabapentin. Continue to Monitor. 11/30/2022 2. Chronic Pain Syndrome: Refilled: Tramadol 50 mg one tablet twice a day for breakthrough pain #60 and   Hydrocodone 10/325 mg one tablet4 times a day as needed for pain #120. We will continue the opioid monitoring program, this consists of regular clinic visits, examinations, urine drug screen, pill counts as well as use of [place] Controlled Substance Reporting system. A 12 month History has been reviewed on the [place] Controlled Substance Reporting System on 11/30/2022 3. Polyarthralgia: Continue HEP as Tolerated: Continue to Monitor. 11/30/2022    F/U in 1 months

## 2022-12-03 ENCOUNTER — Other Ambulatory Visit: Payer: Self-pay | Admitting: Cardiology

## 2022-12-03 DIAGNOSIS — I10 Essential (primary) hypertension: Secondary | ICD-10-CM

## 2022-12-12 ENCOUNTER — Other Ambulatory Visit: Payer: Self-pay | Admitting: Cardiology

## 2022-12-12 DIAGNOSIS — I10 Essential (primary) hypertension: Secondary | ICD-10-CM

## 2022-12-14 ENCOUNTER — Other Ambulatory Visit: Payer: Self-pay | Admitting: Cardiology

## 2022-12-14 DIAGNOSIS — I10 Essential (primary) hypertension: Secondary | ICD-10-CM

## 2022-12-28 ENCOUNTER — Other Ambulatory Visit: Payer: Self-pay | Admitting: Cardiology

## 2022-12-28 DIAGNOSIS — I10 Essential (primary) hypertension: Secondary | ICD-10-CM

## 2023-01-01 ENCOUNTER — Other Ambulatory Visit: Payer: Self-pay | Admitting: Cardiology

## 2023-01-01 DIAGNOSIS — I10 Essential (primary) hypertension: Secondary | ICD-10-CM

## 2023-01-04 ENCOUNTER — Encounter: Payer: Self-pay | Admitting: Registered Nurse

## 2023-01-04 ENCOUNTER — Encounter: Payer: Medicare Other | Attending: Physical Medicine and Rehabilitation | Admitting: Registered Nurse

## 2023-01-04 VITALS — BP 121/73 | HR 64 | Ht 66.0 in | Wt 162.0 lb

## 2023-01-04 DIAGNOSIS — Z79891 Long term (current) use of opiate analgesic: Secondary | ICD-10-CM | POA: Diagnosis not present

## 2023-01-04 DIAGNOSIS — T451X5A Adverse effect of antineoplastic and immunosuppressive drugs, initial encounter: Secondary | ICD-10-CM | POA: Diagnosis not present

## 2023-01-04 DIAGNOSIS — M2550 Pain in unspecified joint: Secondary | ICD-10-CM | POA: Diagnosis not present

## 2023-01-04 DIAGNOSIS — G620 Drug-induced polyneuropathy: Secondary | ICD-10-CM

## 2023-01-04 DIAGNOSIS — Z5181 Encounter for therapeutic drug level monitoring: Secondary | ICD-10-CM | POA: Diagnosis not present

## 2023-01-04 DIAGNOSIS — I890 Lymphedema, not elsewhere classified: Secondary | ICD-10-CM

## 2023-01-04 DIAGNOSIS — G894 Chronic pain syndrome: Secondary | ICD-10-CM

## 2023-01-04 MED ORDER — HYDROCODONE-ACETAMINOPHEN 10-325 MG PO TABS: 1.0000 | ORAL_TABLET | Freq: Four times a day (QID) | ORAL | 0 refills | Status: DC | PRN

## 2023-01-04 NOTE — Progress Notes (Signed)
Subjective:    Patient ID: Kristi [name], female    DOB: 06/22/1956, 67 y.o.   MRN: [id number]  HPI: Kristi [name] is a 67 y.o. female who returns for follow up appointment for chronic pain and medication refill. She states her pain is located in her left hand and bilateral feet with tingling and burning. She rates her pain 3. Her current exercise regime is walking and performing stretching exercises.  [name] Morphine equivalent is 40.00 MME.   Last UDS was Performed on 09/14/2022, it was consistent.      Pain Inventory Average Pain 5 Pain Right Now 3 My pain is intermittent, burning, tingling, and aching  In the last 24 hours, has pain interfered with the following? General activity 4 Relation with others 3 Enjoyment of life 3 What TIME of day is your pain at its worst? morning , evening, and night Sleep (in general) Good  Pain is worse with: walking, sitting, inactivity, and standing Pain improves with: medication Relief from Meds: 6  Family History  Problem Relation Age of Onset   Lung cancer Father    Hypertension Father    Thyroid cancer Father        dx in his 74s   Cancer Father        lung, thyroid, smoker   Breast cancer Paternal Aunt 45   Colon cancer Paternal Aunt        dx in her 50x   Cervical cancer Paternal Aunt        dzx in her 21s   Ovarian cancer Cousin        dx in her lage 11s   Breast cancer Cousin        maternal first cousin, once removed; dx in her late 31s   Breast cancer Cousin        maternal first cousin once removed; dx in late 58s   Hypertension Mother    Diabetes Mother    Dementia Mother    Memory loss Mother    Hypertension Brother    Seizures Brother        Alcohol induced.   Alcohol abuse Brother        drinker, smoker   COPD Brother    Cancer Paternal Uncle        oral cancer   Kidney cancer Paternal Grandmother    Arthritis Daughter        back surgery   Cancer Cousin        several paternal cousins with brain  cancer, leukemia, and other cancers   Cancer Sister        stomach   Social History   Socioeconomic History   Marital status: Married    Spouse name: [name]   Number of children: 1   Years of education: 12   Highest education level: Not on file  Occupational History   Occupation: INVENTORY Hotel manager: [employer]   Occupation: disability no longer working 2015  Tobacco Use   Smoking status: Never   Smokeless tobacco: Never  Vaping Use   Vaping Use: Never used  Substance and Sexual Activity   Alcohol use: No   Drug use: No   Sexual activity: Yes    Comment: lives with husband, disability/retirement. [employer], no dietary restrictions  Other Topics Concern   Not on file  Social History Narrative   Patient is married [name]) and lives at home with her husband.  Patient has one daughter   Patient drinks very little caffeine.   Left handed   Social Determinants of Health   Financial Resource Strain: Low Risk  (07/24/2022)   Overall Financial Resource Strain (CARDIA)    Difficulty of Paying Living Expenses: Not hard at all  Food Insecurity: No Food Insecurity (07/24/2022)   Hunger Vital Sign    Worried About Running Out of Food in the Last Year: Never true    Ran Out of Food in the Last Year: Never true  Transportation Needs: No Transportation Needs (07/24/2022)   PRAPARE - Hydrologist (Medical): No    Lack of Transportation (Non-Medical): No  Physical Activity: Sufficiently Active (07/24/2022)   Exercise Vital Sign    Days of Exercise per Week: 6 days    Minutes of Exercise per Session: 140 min  Stress: No Stress Concern Present (07/24/2022)   [place]    Feeling of Stress : Not at all  Social Connections: Socially Integrated (07/24/2022)   Social Connection and Isolation Panel [NHANES]    Frequency of Communication with Friends and Family:  More than three times a week    Frequency of Social Gatherings with Friends and Family: Once a week    Attends Religious Services: 1 to 4 times per year    Active Member of Clubs or Organizations: Yes    Attends Music therapist: More than 4 times per year    Marital Status: Married   Past Surgical History:  Procedure Laterality Date   ANAL SPHINCTEROTOMY  04/2011   APPENDECTOMY  1980   AXILLARY LYMPH NODE DISSECTION Left 02/04/2013   Procedure: LEFT AXILLARY LYMPH NODE DISSECTION;  Surgeon: Stark Klein, MD;  Location: [facility];  Service: General;  Laterality: Left;  End: 1512   BREAST LUMPECTOMY WITH NEEDLE LOCALIZATION Left 02/04/2013   Procedure: LEFT BREAST LUMPECTOMY WITH NEEDLE LOCALIZATION;  Surgeon: Stark Klein, MD;  Location: [facility];  Service: General;  Laterality: Left;   BREAST SURGERY     Lumpectomy in april 2014   [place]  04/2011   ligation   MASTECTOMY Left 02/15/2017   PORT-A-CATH REMOVAL N/A 04/16/2014   Procedure: REMOVAL PORT-A-CATH;  Surgeon: Stark Klein, MD;  Location: [facility];  Service: General;  Laterality: N/A;   PORTACATH PLACEMENT Right 02/04/2013   Procedure: INSERTION PORT-A-CATH;  Surgeon: Stark Klein, MD;  Location: [facility];  Service: General;  Laterality: Right;  Start [id number].   SHOULDER ARTHROSCOPY WITH ROTATOR CUFF REPAIR AND SUBACROMIAL DECOMPRESSION Left 02/24/2014   Procedure: SHOULDER ARTHROSCOPY WITH ROTATOR CUFF REPAIR AND SUBACROMIAL DECOMPRESSION;  Surgeon: Meredith Pel, MD;  Location: [facility];  Service: Orthopedics;  Laterality: Left;  LEFT SHOULDER DIAGNOSTIC OPERATIVE ARTHROSCOPY, SUBACROMIAL DECOMPRESSION, ROTATOR CUFF TEAR REPAIR   SIMPLE MASTECTOMY WITH AXILLARY SENTINEL NODE BIOPSY Left 02/15/2017   Procedure: LEFT MASTECTOMY;  Surgeon: Stark Klein, MD;  Location: [facility];  Service: General;  Laterality: Left;   TOTAL MASTECTOMY Right 12/26/2018   Procedure: RIGHT BREAST PROPHYLATIC MASTECTOMY;  Surgeon: Stark Klein, MD;   Location: [facility];  Service: General;  Laterality: Right;   Past Surgical History:  Procedure Laterality Date   ANAL SPHINCTEROTOMY  04/2011   APPENDECTOMY  1980   AXILLARY LYMPH NODE DISSECTION Left 02/04/2013   Procedure: LEFT AXILLARY LYMPH NODE DISSECTION;  Surgeon: Stark Klein, MD;  Location: [facility];  Service: General;  Laterality: Left;  End: 1512  BREAST LUMPECTOMY WITH NEEDLE LOCALIZATION Left 02/04/2013   Procedure: LEFT BREAST LUMPECTOMY WITH NEEDLE LOCALIZATION;  Surgeon: Stark Klein, MD;  Location: [facility];  Service: General;  Laterality: Left;   BREAST SURGERY     Lumpectomy in april 2014   [place]  04/2011   ligation   MASTECTOMY Left 02/15/2017   PORT-A-CATH REMOVAL N/A 04/16/2014   Procedure: REMOVAL PORT-A-CATH;  Surgeon: Stark Klein, MD;  Location: [facility];  Service: General;  Laterality: N/A;   PORTACATH PLACEMENT Right 02/04/2013   Procedure: INSERTION PORT-A-CATH;  Surgeon: Stark Klein, MD;  Location: [facility];  Service: General;  Laterality: Right;  Start [id number].   SHOULDER ARTHROSCOPY WITH ROTATOR CUFF REPAIR AND SUBACROMIAL DECOMPRESSION Left 02/24/2014   Procedure: SHOULDER ARTHROSCOPY WITH ROTATOR CUFF REPAIR AND SUBACROMIAL DECOMPRESSION;  Surgeon: Meredith Pel, MD;  Location: [facility];  Service: Orthopedics;  Laterality: Left;  LEFT SHOULDER DIAGNOSTIC OPERATIVE ARTHROSCOPY, SUBACROMIAL DECOMPRESSION, ROTATOR CUFF TEAR REPAIR   SIMPLE MASTECTOMY WITH AXILLARY SENTINEL NODE BIOPSY Left 02/15/2017   Procedure: LEFT MASTECTOMY;  Surgeon: Stark Klein, MD;  Location: [facility];  Service: General;  Laterality: Left;   TOTAL MASTECTOMY Right 12/26/2018   Procedure: RIGHT BREAST PROPHYLATIC MASTECTOMY;  Surgeon: Stark Klein, MD;  Location: [facility];  Service: General;  Laterality: Right;   Past Medical History:  Diagnosis Date   Abnormal gait 12/17/2019   Acute blood loss anemia    Anal fissure 05/03/2011   Anal pain 04/27/2021   Anemia    Iron deficinecy anemia    Anemia 05/14/2017   Anxiety and depression 05/15/2014   Arthritis    Body mass index (BMI) 30.0-30.9, adult 12/17/2019   Breast cancer ([place])    left ,last radiation 2'15, last chemo 8'14   Cervical cancer screening 06/18/2018   Menarche at 11 Regular and moderate flow  history of abnormal pap in past, 1 mild abnormality years ago that resolved spontaneously on recheck G1P1, s/p 1 svd history of abnormal MGM, b/o breast cancer 2014 No concerns today  gyn surgeries. Lumpectomy  LMP around early 2014   Cervical stenosis of spine 12/17/2019   Constipation 11/13/2016   Contracture of axilla 05/20/2013   Debility 01/18/2020   Essential (primary) hypertension 12/17/2019   Flushing 02/20/2014   Gait instability 12/17/2019   Genetic testing 02/20/2019   Negative genetic testing on the Comprehensive Cancer Panel.  The Comprehensive Common Cancer Panel offered by [facility] includes sequencing and/or deletion duplication testing of the following 46 genes: APC, ATM, AXIN2, BAP1, BARD1, BMPR1A, BRCA1, BRCA2, BRIP1, CDH1, CDK4, CDKN2A, CHEK2, EPCAM, FANCC, FH, FLCN, HOXB13, MET, MITF,  MLH1, MSH2, MSH6, MUTYH, NBN, NF1, NTHL1,  PALB2, PMS2, POLD1, POLE, P   H/O: CVA (cerebrovascular accident) 06/19/2020   Hereditary and idiopathic peripheral neuropathy 11/11/2013   Herpes zoster 05/15/2014   History of chicken pox    History of left breast cancer 02/15/2017   History of radiation therapy 09/09/13-10/28/13   45 gray to left breast, lumpectomy cavity boosted to 63 gray   HTN (hypertension) 11/13/2016   Hyperglycemia 01/09/2016   Hyperlipidemia 05/14/2017   Hypertension    Internal hemorrhoids without complication [id number]   Knee pain, bilateral 11/14/2016   Left upper extremity numbness 06/19/2018   Major depressive disorder, recurrent episode, severe ([place]) 01/12/2014   Malignant neoplasm of upper-inner quadrant of female breast ([place]) 01/24/2013    Left IDC, ER 3%, PR-, Her2neu-  Formatting of this note  might be different from the original. Overview:  Left IDC, ER 3%, PR-, Her2neu-  Last Assessment & Plan:  Pt will continue to take her tamoxifen as previously directed. She appears to be tolerating this with only c/o moderate, occ hot flashes.   Mild neurocognitive disorder due to multiple etiologies 03/17/2020   MRSA (methicillin resistant Staphylococcus aureus) 2009   right groin area-no issues now. 04-07-14 PCR screen negative today.   Neuropathy    Neuropathy of hand, left 09/21/2015   Nodule of finger of left hand 11/14/2016   Palpitations 07/30/2020   Personal history of colonic polyps 04/27/2021   Preventative health care 11/13/2016   Right hip pain 11/14/2016   S/P mastectomy, right 12/26/2018   Sebaceous cyst 04/12/2013   Shoulder joint pain 12/12/2013   Spinal stenosis in cervical region 10/09/2019   Strain of rotator cuff 02/24/2014   Stroke ([place]) 06/2020   Vertigo    LMP 01/20/2013   Opioid Risk Score:   Fall Risk Score:  `1  Depression screen PHQ 2/9     11/30/2022    2:24 PM 09/14/2022    1:44 PM 08/03/2022    1:19 PM 07/24/2022    1:36 PM 06/22/2022    1:29 PM 05/11/2022    2:11 PM 03/30/2022   11:33 AM  Depression screen PHQ 2/9  Decreased Interest 0 0 0 0 0 0 0  Down, Depressed, Hopeless 0 0 0 0 0 0 0  PHQ - 2 Score 0 0 0 0 0 0 0    Review of Systems  Musculoskeletal:        Pain in both feet & left hand  All other systems reviewed and are negative.      Objective:   Physical Exam Vitals and nursing note reviewed.  Constitutional:      Appearance: Normal appearance.  Cardiovascular:     Rate and Rhythm: Normal rate and regular rhythm.     Pulses: Normal pulses.     Heart sounds: Normal heart sounds.  Pulmonary:     Effort: Pulmonary effort is normal.     Breath sounds: Normal breath sounds.  Musculoskeletal:     Cervical back: Normal range of motion and neck supple.     Comments: Normal Muscle Bulk and Muscle Testing Reveals:  Upper Extremities:  Full ROM and Muscle Strength 5/5  Lower Extremities: Full ROM and Muscle Strength 5/5 Arises from Table with ease Narrow Based  Gait     Skin:    General: Skin is warm and dry.  Neurological:     Mental Status: She is alert and oriented to person, place, and time.  Psychiatric:        Mood and Affect: Mood normal.        Behavior: Behavior normal.         Assessment & Plan:  1. Chemotherapy Induced Peripheral Neuropathy: Continue current medication regimen with Gabapentin. Continue to Monitor. 01/04/2023 2. Chronic Pain Syndrome: Refilled: Tramadol 50 mg one tablet twice a day for breakthrough pain #60 and   Hydrocodone 10/325 mg one tablet4 times a day as needed for pain #120. We will continue the opioid monitoring program, this consists of regular clinic visits, examinations, urine drug screen, pill counts as well as use of [place] Controlled Substance Reporting system. A 12 month History has been reviewed on the [place] Controlled Substance Reporting System on 01/04/2023 3. Polyarthralgia: Continue HEP as Tolerated: Continue to Monitor. 01/04/2023     F/U in 1 months

## 2023-02-14 ENCOUNTER — Other Ambulatory Visit: Payer: Self-pay | Admitting: Cardiology

## 2023-02-14 DIAGNOSIS — I10 Essential (primary) hypertension: Secondary | ICD-10-CM

## 2023-02-15 ENCOUNTER — Encounter: Payer: Medicare Other | Attending: Physical Medicine and Rehabilitation | Admitting: Registered Nurse

## 2023-02-15 ENCOUNTER — Encounter: Payer: Self-pay | Admitting: Registered Nurse

## 2023-02-15 VITALS — BP 119/73 | HR 66 | Ht 66.0 in | Wt 161.0 lb

## 2023-02-15 DIAGNOSIS — Z5181 Encounter for therapeutic drug level monitoring: Secondary | ICD-10-CM

## 2023-02-15 DIAGNOSIS — Z79891 Long term (current) use of opiate analgesic: Secondary | ICD-10-CM | POA: Diagnosis not present

## 2023-02-15 DIAGNOSIS — T451X5A Adverse effect of antineoplastic and immunosuppressive drugs, initial encounter: Secondary | ICD-10-CM

## 2023-02-15 DIAGNOSIS — M2550 Pain in unspecified joint: Secondary | ICD-10-CM

## 2023-02-15 DIAGNOSIS — G8929 Other chronic pain: Secondary | ICD-10-CM

## 2023-02-15 DIAGNOSIS — I890 Lymphedema, not elsewhere classified: Secondary | ICD-10-CM | POA: Diagnosis not present

## 2023-02-15 DIAGNOSIS — G894 Chronic pain syndrome: Secondary | ICD-10-CM | POA: Diagnosis not present

## 2023-02-15 DIAGNOSIS — G620 Drug-induced polyneuropathy: Secondary | ICD-10-CM | POA: Diagnosis not present

## 2023-02-15 DIAGNOSIS — M25512 Pain in left shoulder: Secondary | ICD-10-CM | POA: Insufficient documentation

## 2023-02-15 MED ORDER — GABAPENTIN 300 MG PO CAPS: 900.0000 mg | ORAL_CAPSULE | Freq: Every day | ORAL | 3 refills | Status: DC

## 2023-02-15 MED ORDER — HYDROCODONE-ACETAMINOPHEN 10-325 MG PO TABS: 1.0000 | ORAL_TABLET | Freq: Four times a day (QID) | ORAL | 0 refills | Status: DC | PRN

## 2023-02-15 MED ORDER — TRAMADOL HCL 50 MG PO TABS: 50.0000 mg | ORAL_TABLET | Freq: Two times a day (BID) | ORAL | 1 refills | Status: DC | PRN

## 2023-02-15 MED ORDER — GABAPENTIN 600 MG PO TABS: 600.0000 mg | ORAL_TABLET | Freq: Two times a day (BID) | ORAL | 3 refills | Status: DC

## 2023-02-15 NOTE — Progress Notes (Signed)
Subjective:    Patient ID: Kristi [name], female    DOB: 02-22-1956, 67 y.o.   MRN: [id number]  HPI: Kristi [name] is a 67 y.o. female who returns for follow up appointment for chronic pain and medication refill. She states her pain is located in her left shoulder and bilateral feet with tingling and burning. She rates her pain 5. Her current exercise regime is walking and performing stretching exercises.  Ms. Durene CalHunter Morphine equivalent is 40.00 MME.   UDS ordered today.     Pain Inventory Average Pain 5 Pain Right Now 5 My pain is burning, dull, stabbing, and tingling  In the last 24 hours, has pain interfered with the following? General activity 3 Relation with others 2 Enjoyment of life 4 What TIME of day is your pain at its worst? morning , daytime, evening, and night Sleep (in general) Good  Pain is worse with: walking, sitting, inactivity, and standing Pain improves with: therapy/exercise and medication Relief from Meds: 5  Family History  Problem Relation Age of Onset   Lung cancer Father    Hypertension Father    Thyroid cancer Father        dx in his 2850s   Cancer Father        lung, thyroid, smoker   Breast cancer Paternal Aunt 2030   Colon cancer Paternal Aunt        dx in her 50x   Cervical cancer Paternal Aunt        dzx in her 6360s   Ovarian cancer Cousin        dx in her lage 9950s   Breast cancer Cousin        maternal first cousin, once removed; dx in her late 1830s   Breast cancer Cousin        maternal first cousin once removed; dx in late 3240s   Hypertension Mother    Diabetes Mother    Dementia Mother    Memory loss Mother    Hypertension Brother    Seizures Brother        Alcohol induced.   Alcohol abuse Brother        drinker, smoker   COPD Brother    Cancer Paternal Uncle        oral cancer   Kidney cancer Paternal Grandmother    Arthritis Daughter        back surgery   Cancer Cousin        several paternal cousins with brain cancer,  leukemia, and other cancers   Cancer Sister        stomach   Social History   Socioeconomic History   Marital status: Married    Spouse name: Jeannett SeniorStephen   Number of children: 1   Years of education: 12   Highest education level: Not on file  Occupational History   Occupation: INVENTORY Office managerPECIALIST     Employer: [employer]   Occupation: disability no longer working 2015  Tobacco Use   Smoking status: Never   Smokeless tobacco: Never  Vaping Use   Vaping Use: Never used  Substance and Sexual Activity   Alcohol use: No   Drug use: No   Sexual activity: Yes    Comment: lives with husband, disability/retirement. [employer], no dietary restrictions  Other Topics Concern   Not on file  Social History Narrative   Patient is married [name](Stephen) and lives at home with her husband.   Patient has  one daughter   Patient drinks very little caffeine.   Left handed   Social Determinants of Health   Financial Resource Strain: Low Risk  (07/24/2022)   Overall Financial Resource Strain (CARDIA)    Difficulty of Paying Living Expenses: Not hard at all  Food Insecurity: No Food Insecurity (07/24/2022)   Hunger Vital Sign    Worried About Running Out of Food in the Last Year: Never true    Ran Out of Food in the Last Year: Never true  Transportation Needs: No Transportation Needs (07/24/2022)   PRAPARE - Administrator, Civil Service (Medical): No    Lack of Transportation (Non-Medical): No  Physical Activity: Sufficiently Active (07/24/2022)   Exercise Vital Sign    Days of Exercise per Week: 6 days    Minutes of Exercise per Session: 140 min  Stress: No Stress Concern Present (07/24/2022)   [employer] of Occupational Health - Occupational Stress Questionnaire    Feeling of Stress : Not at all  Social Connections: Socially Integrated (07/24/2022)   Social Connection and Isolation Panel [NHANES]    Frequency of Communication with Friends and Family: More than  three times a week    Frequency of Social Gatherings with Friends and Family: Once a week    Attends Religious Services: 1 to 4 times per year    Active Member of Clubs or Organizations: Yes    Attends Engineer, structural: More than 4 times per year    Marital Status: Married   Past Surgical History:  Procedure Laterality Date   ANAL SPHINCTEROTOMY  04/2011   APPENDECTOMY  1980   AXILLARY LYMPH NODE DISSECTION Left 02/04/2013   Procedure: LEFT AXILLARY LYMPH NODE DISSECTION;  Surgeon: Almond Lint, MD;  Location: [facility];  Service: General;  Laterality: Left;  End: 1512   BREAST LUMPECTOMY WITH NEEDLE LOCALIZATION Left 02/04/2013   Procedure: LEFT BREAST LUMPECTOMY WITH NEEDLE LOCALIZATION;  Surgeon: Almond Lint, MD;  Location: [facility];  Service: General;  Laterality: Left;   BREAST SURGERY     Lumpectomy in april 2014   HEMORRHOID SURGERY  04/2011   ligation   MASTECTOMY Left 02/15/2017   PORT-A-CATH REMOVAL N/A 04/16/2014   Procedure: REMOVAL PORT-A-CATH;  Surgeon: Almond Lint, MD;  Location: [facility];  Service: General;  Laterality: N/A;   PORTACATH PLACEMENT Right 02/04/2013   Procedure: INSERTION PORT-A-CATH;  Surgeon: Almond Lint, MD;  Location: [facility];  Service: General;  Laterality: Right;  Start Time: 7425.   SHOULDER ARTHROSCOPY WITH ROTATOR CUFF REPAIR AND SUBACROMIAL DECOMPRESSION Left 02/24/2014   Procedure: SHOULDER ARTHROSCOPY WITH ROTATOR CUFF REPAIR AND SUBACROMIAL DECOMPRESSION;  Surgeon: Cammy Copa, MD;  Location: [facility] OR;  Service: Orthopedics;  Laterality: Left;  LEFT SHOULDER DIAGNOSTIC OPERATIVE ARTHROSCOPY, SUBACROMIAL DECOMPRESSION, ROTATOR CUFF TEAR REPAIR   SIMPLE MASTECTOMY WITH AXILLARY SENTINEL NODE BIOPSY Left 02/15/2017   Procedure: LEFT MASTECTOMY;  Surgeon: Almond Lint, MD;  [facility];  Service: General;  Laterality: Left;   TOTAL MASTECTOMY Right 12/26/2018   Procedure: RIGHT BREAST PROPHYLATIC MASTECTOMY;  Surgeon: Almond Lint, MD;  Location:  [facility];  Service: General;  Laterality: Right;   Past Surgical History:  Procedure Laterality Date   ANAL SPHINCTEROTOMY  04/2011   APPENDECTOMY  1980   AXILLARY LYMPH NODE DISSECTION Left 02/04/2013   Procedure: LEFT AXILLARY LYMPH NODE DISSECTION;  Surgeon: Almond Lint, MD;  [facility];  Service: General;  Laterality: Left;  End: 1512   BREAST  LUMPECTOMY WITH NEEDLE LOCALIZATION Left 02/04/2013   Procedure: LEFT BREAST LUMPECTOMY WITH NEEDLE LOCALIZATION;  Surgeon: Almond Lint, MD;  [facility];  Service: General;  Laterality: Left;   BREAST SURGERY     Lumpectomy in april 2014   HEMORRHOID SURGERY  04/2011   ligation   MASTECTOMY Left 02/15/2017   PORT-A-CATH REMOVAL N/A 04/16/2014   Procedure: REMOVAL PORT-A-CATH;  Surgeon: Almond Lint, MD;  Location: [facility];  Service: General;  Laterality: N/A;   PORTACATH PLACEMENT Right 02/04/2013   Procedure: INSERTION PORT-A-CATH;  Surgeon: Almond Lint, MD;  [facility];  Service: General;  Laterality: Right;  Start Time: 8295.   SHOULDER ARTHROSCOPY WITH ROTATOR CUFF REPAIR AND SUBACROMIAL DECOMPRESSION Left 02/24/2014   Procedure: SHOULDER ARTHROSCOPY WITH ROTATOR CUFF REPAIR AND SUBACROMIAL DECOMPRESSION;  Surgeon: Cammy Copa, MD;  Location[facility] OR;  Service: Orthopedics;  Laterality: Left;  LEFT SHOULDER DIAGNOSTIC OPERATIVE ARTHROSCOPY, SUBACROMIAL DECOMPRESSION, ROTATOR CUFF TEAR REPAIR   SIMPLE MASTECTOMY WITH AXILLARY SENTINEL NODE BIOPSY Left 02/15/2017   Procedure: LEFT MASTECTOMY;  Surgeon: Almond Lint, MD;  [facility];  Service: General;  Laterality: Left;   TOTAL MASTECTOMY Right 12/26/2018   Procedure: RIGHT BREAST PROPHYLATIC MASTECTOMY;  Surgeon: Almond Lint, MD;  [facility];  Service: General;  Laterality: Right;   Past Medical History:  Diagnosis Date   Abnormal gait 12/17/2019   Acute blood loss anemia    Anal fissure 05/03/2011   Anal pain 04/27/2021   Anemia    Iron deficinecy anemia   Anemia  05/14/2017   Anxiety and depression 05/15/2014   Arthritis    Body mass index (BMI) 30.0-30.9, adult 12/17/2019   Breast cancer (HCC)    left ,last radiation 2'15, last chemo 8'14   Cervical cancer screening 06/18/2018   Menarche at 11 Regular and moderate flow  history of abnormal pap in past, 1 mild abnormality years ago that resolved spontaneously on recheck G1P1, s/p 1 svd history of abnormal MGM, b/o breast cancer 2014 No concerns today  gyn surgeries. Lumpectomy  LMP around early 2014   Cervical stenosis of spine 12/17/2019   Constipation 11/13/2016   Contracture of axilla 05/20/2013   Debility 01/18/2020   Essential (primary) hypertension 12/17/2019   Flushing 02/20/2014   Gait instability 12/17/2019   Genetic testing 02/20/2019   Negative genetic testing on the Comprehensive Cancer Panel.  The Comprehensive Common Cancer Panel offered by [facility] includes sequencing and/or deletion duplication testing of the following 46 genes: APC, ATM, AXIN2, BAP1, BARD1, BMPR1A, BRCA1, BRCA2, BRIP1, CDH1, CDK4, CDKN2A, CHEK2, EPCAM, FANCC, FH, FLCN, HOXB13, MET, MITF,  MLH1, MSH2, MSH6, MUTYH, NBN, NF1, NTHL1,  PALB2, PMS2, POLD1, POLE, P   H/O: CVA (cerebrovascular accident) 06/19/2020   Hereditary and idiopathic peripheral neuropathy 11/11/2013   Herpes zoster 05/15/2014   History of chicken pox    History of left breast cancer 02/15/2017   History of radiation therapy 09/09/13-10/28/13   45 gray to left breast, lumpectomy cavity boosted to 63 gray   HTN (hypertension) 11/13/2016   Hyperglycemia 01/09/2016   Hyperlipidemia 05/14/2017   Hypertension    Internal hemorrhoids without complication 05/03/2011   Knee pain, bilateral 11/14/2016   Left upper extremity numbness 06/19/2018   Major depressive disorder, recurrent episode, severe (HCC) 01/12/2014   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 Overview:  Left IDC, ER 3%, PR-, Her2neu-  Last Assessment & Plan:  Pt will continue to take her tamoxifen as previously directed. She appears to be tolerating this with only c/o moderate, occ hot flashes.   Mild neurocognitive disorder due to multiple etiologies 03/17/2020   MRSA (methicillin resistant Staphylococcus aureus) 2009   right groin area-no issues now. 04-07-14 PCR screen negative today.   Neuropathy    Neuropathy of hand, left 09/21/2015   Nodule of finger of left hand 11/14/2016   Palpitations 07/30/2020   Personal history of colonic polyps 04/27/2021   Preventative health care 11/13/2016   Right hip pain 11/14/2016   S/P mastectomy, right 12/26/2018   Sebaceous cyst 04/12/2013   Shoulder joint pain 12/12/2013   Spinal stenosis in cervical region 10/09/2019   Strain of rotator cuff 02/24/2014   Stroke (HCC) 06/2020   Vertigo    BP 119/73   Pulse 66   Ht 5\' 6"  (1.676 m)   Wt 161 lb (73 kg)   LMP 01/20/2013   SpO2 95%   BMI 25.99 kg/m   Opioid Risk Score:   Fall Risk Score:  `1  Depression screen [facility] 2/9     02/15/2023   12:46 PM 01/04/2023    2:38 PM 11/30/2022    2:24 PM 09/14/2022    1:44 PM 08/03/2022    1:19 PM 07/24/2022    1:36 PM 06/22/2022    1:29 PM  Depression screen PHQ 2/9  Decreased Interest 0 1 0 0 0 0 0  Down, Depressed, Hopeless 0 1 0 0 0 0 0  PHQ - 2 Score 0 2 0 0 0 0 0     Review of Systems  Constitutional: Negative.   HENT: Negative.    Eyes: Negative.   Respiratory: Negative.    Cardiovascular: Negative.   Gastrointestinal: Negative.   Endocrine: Negative.   Genitourinary: Negative.   Musculoskeletal:        Left shoulder  hand and both feet pain  Skin: Negative.   Allergic/Immunologic: Negative.   Neurological: Negative.   Hematological: Negative.   Psychiatric/Behavioral: Negative.    All other systems reviewed and are negative.      Objective:   Physical Exam Vitals and nursing note  reviewed.  Constitutional:      Appearance: Normal appearance.  Cardiovascular:     Rate and Rhythm: Normal rate and regular rhythm.     Pulses: Normal pulses.     Heart sounds: Normal heart sounds.  Pulmonary:     Effort: Pulmonary effort is normal.     Breath sounds: Normal breath sounds.  Musculoskeletal:     Cervical back: Normal range of motion and neck supple.     Comments: Normal Muscle Bulk and Muscle Testing Reveals:  Upper Extremities: Full ROM and Muscle Strength 5/5  Lower Extremities: Full ROM and Muscle Strength 5/5 Arises from Table with ease Narrow Based  Gait     Skin:    General: Skin is warm and dry.  Neurological:     Mental Status: She is alert and oriented to person, place, and time.  Psychiatric:        Mood and Affect: Mood normal.        Behavior: Behavior normal.         Assessment & Plan:  1. Chemotherapy Induced Peripheral Neuropathy: Continue current medication regimen with Gabapentin. Continue to Monitor. 02/15/2023 2. Chronic Pain Syndrome: Refilled: Tramadol 50 mg one tablet twice a day for breakthrough  pain #60 and   Hydrocodone 10/325 mg one tablet4 times a day as needed for pain #120. We will continue the opioid monitoring program, this consists of regular clinic visits, examinations, urine drug screen, pill counts as well as use of [place] Controlled Substance Reporting system. A 12 month History has been reviewed on the [place] Controlled Substance Reporting System on 02/15/2023 3. Polyarthralgia: Continue HEP as Tolerated: Continue to Monitor. 02/15/2023  F/U in 1 months

## 2023-02-21 ENCOUNTER — Telehealth: Payer: Self-pay | Admitting: *Deleted

## 2023-02-21 ENCOUNTER — Encounter: Payer: Self-pay | Admitting: *Deleted

## 2023-02-21 LAB — TOXASSURE SELECT,+ANTIDEPR,UR

## 2023-02-21 NOTE — Telephone Encounter (Signed)
Opened in error

## 2023-02-21 NOTE — Telephone Encounter (Signed)
Urine drug screen for this encounter is consistent for prescribed medication 

## 2023-03-03 ENCOUNTER — Other Ambulatory Visit: Payer: Self-pay | Admitting: Cardiology

## 2023-03-03 DIAGNOSIS — I10 Essential (primary) hypertension: Secondary | ICD-10-CM

## 2023-03-19 ENCOUNTER — Ambulatory Visit: Payer: Medicare Other | Admitting: Pharmacist

## 2023-03-19 DIAGNOSIS — I10 Essential (primary) hypertension: Secondary | ICD-10-CM

## 2023-03-19 NOTE — Progress Notes (Signed)
03/19/2023 Name: Kristi [name] MRN: [id number] DOB: 01/26/1956  Chief Complaint  Patient presents with   Medication Management   Hypertension    Kristi [name] is a 67 y.o. year old female who presented for a telephone visit.   They were referred to the pharmacist by a quality report for assistance in managing hypertension and medication access.    Subjective:  Care Team: Primary Care Provider: Bradd Canary, MD ; Next Scheduled Visit: no appointment currently scheduled Cardiologist: Dr Tomie China ; Next Scheduled Visit: no appointment currently scheduled (last visit was 09/2021 - planned follow up was 9 months.  Medication Access/Adherence  Current Pharmacy:  [facility] [phone] Nicholes Rough, [facility] - [address] [place] [address] Phone: [phone] Fax: [phone]  [id number] Lorenza Evangelist, [place] - [address] AT [facility] [address] [place] [address] Phone: [phone] Fax: [phone]   Patient reports affordability concerns with their medications: No  Patient reports access/transportation concerns to their pharmacy: No  Patient reports adherence concerns with their medications:  Yes  - difficulty getting RF for valsartan 160 mg  Patient is on list of low adherence for 2024 for valsartan. Last RF was for #15 tablets 12/18/2022 (Last impactable date is today 03/19/2023 or she will fail [facility] measure for 2024)    Hypertension:  Current medications: valsartan 160mg  daily,  Medications previously tried: valsartan-hydrochlorothiazide    Patient denies hypotensive s/sx including no dizziness, lightheadedness.  Patient denies hypertensive symptoms including no headache, chest pain, shortness of breath    Objective:  Lab Results  Component Value Date   HGBA1C 5.9 03/28/2022    Lab Results  Component Value Date   CREATININE 1.04 03/28/2022   BUN 18 03/28/2022   NA 142 03/28/2022   K 4.9  03/28/2022   CL 105 03/28/2022   CO2 30 03/28/2022    Lab Results  Component Value Date   CHOL 115 03/28/2022   HDL 52.30 03/28/2022   LDLCALC 49 03/28/2022   TRIG 68.0 03/28/2022   CHOLHDL 2 03/28/2022    Medications Reviewed Today     Reviewed by Henrene Pastor, RPH-CPP (Pharmacist) on 03/19/23 at 1033  Med List Status: <None>   Medication Order Taking? Sig Documenting Provider Last Dose Status Informant  anastrozole (ARIMIDEX) 1 MG tablet [id number] Yes TAKE 1 TABLET BY MOUTH DAILY IN THE Mauricio Po, MD Taking Active   Apoaequorin (PREVAGEN) 10 MG CAPS [id number] Yes Take 1 capsule by mouth daily. Serena Croissant, MD Taking Active Self  ARIPiprazole (ABILIFY) 5 MG tablet [id number] Yes Take 5 mg by mouth at bedtime. [provider] Taking Active   aspirin EC 325 MG tablet [id number] Yes Take 1 tablet (325 mg total) by mouth daily. Serena Croissant, MD Taking Active   atorvastatin (LIPITOR) 40 MG tablet [id number] Yes TAKE 1 TABLET BY MOUTH EVERY DAY Bradd Canary, MD Taking Active   Cholecalciferol 50 MCG (2000 UT) CAPS [id number] Yes as directed Orally [provider] Taking Active   gabapentin (NEURONTIN) 300 MG capsule [id number] Yes Take 3 capsules (900 mg total) by mouth at bedtime. Jones Bales, NP Taking Active   gabapentin (NEURONTIN) 600 MG tablet [id number] Yes Take 1 tablet (600 mg total) by mouth 2 (two) times daily. Jones Bales, NP Taking Active   HYDROcodone-acetaminophen [facility]) 10-325 MG tablet [id number] Yes Take 1 tablet by mouth 4 (four) times daily as needed. Jacalyn Lefevre  L, NP Taking Active   hydrOXYzine (ATARAX/VISTARIL) 50 MG tablet [id number] Yes Take 1 tablet (50 mg total) by mouth 2 (two) times daily. Charlton Amor, PA-C Taking Active   Multiple Vitamin (MULTIVITAMIN WITH MINERALS) TABS tablet [id number] Yes Take 1 tablet by mouth daily. [provider] Taking Active Self  polyethylene glycol (MIRALAX / GLYCOLAX) 17 g  packet [id number] Yes Take 17 g by mouth daily as needed for mild constipation. Arrien, York Ram, MD Taking Active   pyridOXINE (B-6) 50 MG tablet [id number] Yes Take 50 mg by mouth daily. [provider] Taking Active   traMADol (ULTRAM) 50 MG tablet [id number] Yes Take 1 tablet (50 mg total) by mouth 2 (two) times daily as needed. [facility] through Pain Jones Bales, NP Taking Active   traZODone (DESYREL) 50 MG tablet [id number] Yes Take 1 tablet (50 mg total) by mouth at bedtime. AngiulliMcarthur Rossetti, PA-C Taking Active   valsartan (DIOVAN) 160 MG tablet [id number] Yes Take 1 tablet (160 mg total) by mouth daily. Patient needs an appointment for further refills. 3 rd/final attempt Revankar, Aundra Dubin, MD Taking Active               Assessment/Plan:   Hypertension:controlled - Reviewed long term cardiovascular and renal outcomes of uncontrolled blood pressure - Recommend to continue valsartan 160mg  daily. WIll check with PCP about sending in Rx to last until next appt.  - Also checking with PCP about follow up appointment.  - Reminded patient to call cardiologist office to make follow up appointment.     Henrene Pastor, PharmD Clinical Pharmacist [facility]

## 2023-03-23 ENCOUNTER — Other Ambulatory Visit: Payer: Self-pay | Admitting: Cardiology

## 2023-03-23 DIAGNOSIS — I10 Essential (primary) hypertension: Secondary | ICD-10-CM

## 2023-03-29 ENCOUNTER — Encounter: Payer: Self-pay | Admitting: Registered Nurse

## 2023-03-29 ENCOUNTER — Encounter: Payer: Medicare Other | Attending: Physical Medicine and Rehabilitation | Admitting: Registered Nurse

## 2023-03-29 VITALS — BP 138/74 | HR 56 | Ht 66.0 in | Wt 161.2 lb

## 2023-03-29 DIAGNOSIS — M2550 Pain in unspecified joint: Secondary | ICD-10-CM | POA: Diagnosis not present

## 2023-03-29 DIAGNOSIS — G894 Chronic pain syndrome: Secondary | ICD-10-CM | POA: Insufficient documentation

## 2023-03-29 DIAGNOSIS — M25512 Pain in left shoulder: Secondary | ICD-10-CM

## 2023-03-29 DIAGNOSIS — Z79891 Long term (current) use of opiate analgesic: Secondary | ICD-10-CM | POA: Diagnosis not present

## 2023-03-29 DIAGNOSIS — T451X5A Adverse effect of antineoplastic and immunosuppressive drugs, initial encounter: Secondary | ICD-10-CM | POA: Diagnosis not present

## 2023-03-29 DIAGNOSIS — Z5181 Encounter for therapeutic drug level monitoring: Secondary | ICD-10-CM

## 2023-03-29 DIAGNOSIS — G8929 Other chronic pain: Secondary | ICD-10-CM | POA: Diagnosis not present

## 2023-03-29 DIAGNOSIS — G620 Drug-induced polyneuropathy: Secondary | ICD-10-CM | POA: Diagnosis not present

## 2023-03-29 DIAGNOSIS — H8112 Benign paroxysmal vertigo, left ear: Secondary | ICD-10-CM | POA: Diagnosis not present

## 2023-03-29 DIAGNOSIS — I890 Lymphedema, not elsewhere classified: Secondary | ICD-10-CM | POA: Insufficient documentation

## 2023-03-29 MED ORDER — HYDROCODONE-ACETAMINOPHEN 10-325 MG PO TABS: 1.0000 | ORAL_TABLET | Freq: Four times a day (QID) | ORAL | 0 refills | Status: DC | PRN

## 2023-03-29 NOTE — Progress Notes (Signed)
Subjective:    Patient ID: Kristi [name], female    DOB: Dec 29, 1955, 67 y.o.   MRN: [id number]  HPI: Kristi [name] is a 67 y.o. female who returns for follow up appointment for chronic pain and medication refill. She states her  pain is located in her left finger tips with tingling, left shoulder pain and bilateral feet with tingling.   Ms. Carpinelli reports she has been having vertigo for the last few weeks, and has noticed her eyes feel jumpy ( she stated) Orthostatic Blood Pressure were obtain, we discussed various treatment for Vertigo, she refuses Therapy at this time, she would like to speak with her PCP first she states. The above was discussed with Dr Carlis Abbott, she agrees with the above. Ms. Choudhury will call her PCP today. She was instructed to senbd a My-Chart message after speaking with her PCP she verbalizes understanding.   She  rates her pain 4. Her current exercise regime is walking and performing stretching exercises.  Ms. Daddio Morphine equivalent is 40.00 MME.   Last UDS was Performed on 02/15/2023, it was consistent.     Pain Inventory Average Pain 5 Pain Right Now 4 My pain is burning, stabbing, and aching  In the last 24 hours, has pain interfered with the following? General activity 5 Relation with others 3 Enjoyment of life 4 What TIME of day is your pain at its worst? morning , daytime, and evening Sleep (in general) Good  Pain is worse with: walking, sitting, inactivity, and standing Pain improves with: therapy/exercise and medication Relief from Meds: 6  Family History  Problem Relation Age of Onset   Lung cancer Father    Hypertension Father    Thyroid cancer Father        dx in his 52s   Cancer Father        lung, thyroid, smoker   Breast cancer Paternal Aunt 7   Colon cancer Paternal Aunt        dx in her 50x   Cervical cancer Paternal Aunt        dzx in her 8s   Ovarian cancer Cousin        dx in her lage 77s   Breast cancer Cousin         maternal first cousin, once removed; dx in her late 37s   Breast cancer Cousin        maternal first cousin once removed; dx in late 54s   Hypertension Mother    Diabetes Mother    Dementia Mother    Memory loss Mother    Hypertension Brother    Seizures Brother        Alcohol induced.   Alcohol abuse Brother        drinker, smoker   COPD Brother    Cancer Paternal Uncle        oral cancer   Kidney cancer Paternal Grandmother    Arthritis Daughter        back surgery   Cancer Cousin        several paternal cousins with brain cancer, leukemia, and other cancers   Cancer Sister        stomach   Social History   Socioeconomic History   Marital status: Married    Spouse name: [name]   Number of children: 1   Years of education: 12   Highest education level: Not on file  Occupational History   Occupation: INVENTORY Barrister's clerk  Employer: [employer]   Occupation: disability no longer working 2015  Tobacco Use   Smoking status: Never   Smokeless tobacco: Never  Vaping Use   Vaping Use: Never used  Substance and Sexual Activity   Alcohol use: No   Drug use: No   Sexual activity: Yes    Comment: lives with husband, disability/retirement. [employer], no dietary restrictions  Other Topics Concern   Not on file  Social History Narrative   Patient is married [name]) and lives at home with her husband.   Patient has one daughter   Patient drinks very little caffeine.   Left handed   Social Determinants of Health   Financial Resource Strain: Low Risk  (07/24/2022)   Overall Financial Resource Strain (CARDIA)    Difficulty of Paying Living Expenses: Not hard at all  Food Insecurity: No Food Insecurity (07/24/2022)   Hunger Vital Sign    Worried About Running Out of Food in the Last Year: Never true    Ran Out of Food in the Last Year: Never true  Transportation Needs: No Transportation Needs (07/24/2022)   PRAPARE - Scientist, research (physical sciences) (Medical): No    Lack of Transportation (Non-Medical): No  Physical Activity: Sufficiently Active (07/24/2022)   Exercise Vital Sign    Days of Exercise per Week: 6 days    Minutes of Exercise per Session: 140 min  Stress: No Stress Concern Present (07/24/2022)   [employer] of Occupational Health - Occupational Stress Questionnaire    Feeling of Stress : Not at all  Social Connections: Socially Integrated (07/24/2022)   Social Connection and Isolation Panel [NHANES]    Frequency of Communication with Friends and Family: More than three times a week    Frequency of Social Gatherings with Friends and Family: Once a week    Attends Religious Services: 1 to 4 times per year    Active Member of Clubs or Organizations: Yes    Attends Engineer, structural: More than 4 times per year    Marital Status: Married   Past Surgical History:  Procedure Laterality Date   ANAL SPHINCTEROTOMY  04/2011   APPENDECTOMY  1980   AXILLARY LYMPH NODE DISSECTION Left 02/04/2013   Procedure: LEFT AXILLARY LYMPH NODE DISSECTION;  Surgeon: Almond Lint, MD;  Location: [facility] OR;  Service: General;  Laterality: Left;  End: 1512   BREAST LUMPECTOMY WITH NEEDLE LOCALIZATION Left 02/04/2013   Procedure: LEFT BREAST LUMPECTOMY WITH NEEDLE LOCALIZATION;  Surgeon: Almond Lint, MD;  Location: [facility] OR;  Service: General;  Laterality: Left;   BREAST SURGERY     Lumpectomy in april 2014   HEMORRHOID SURGERY  04/2011   ligation   MASTECTOMY Left 02/15/2017   PORT-A-CATH REMOVAL N/A 04/16/2014   Procedure: REMOVAL PORT-A-CATH;  Surgeon: Almond Lint, MD;  Location: [facility];  Service: General;  Laterality: N/A;   PORTACATH PLACEMENT Right 02/04/2013   Procedure: INSERTION PORT-A-CATH;  Surgeon: Almond Lint, MD;  Location: [facility];  Service: General;  Laterality: Right;  Start Time: 1610.   SHOULDER ARTHROSCOPY WITH ROTATOR CUFF REPAIR AND SUBACROMIAL DECOMPRESSION Left 02/24/2014   Procedure: SHOULDER  ARTHROSCOPY WITH ROTATOR CUFF REPAIR AND SUBACROMIAL DECOMPRESSION;  Surgeon: Cammy Copa, MD;  Location: [facility] OR;  Service: Orthopedics;  Laterality: Left;  LEFT SHOULDER DIAGNOSTIC OPERATIVE ARTHROSCOPY, SUBACROMIAL DECOMPRESSION, ROTATOR CUFF TEAR REPAIR   SIMPLE MASTECTOMY WITH AXILLARY SENTINEL NODE BIOPSY Left 02/15/2017   Procedure: LEFT MASTECTOMY;  Surgeon: Almond Lint, MD;  Location: [facility] OR;  Service: General;  Laterality: Left;   TOTAL MASTECTOMY Right 12/26/2018   Procedure: RIGHT BREAST PROPHYLATIC MASTECTOMY;  Surgeon: Almond Lint, MD;  Location: [facility];  Service: General;  Laterality: Right;   Past Surgical History:  Procedure Laterality Date   ANAL SPHINCTEROTOMY  04/2011   APPENDECTOMY  1980   AXILLARY LYMPH NODE DISSECTION Left 02/04/2013   Procedure: LEFT AXILLARY LYMPH NODE DISSECTION;  Surgeon: Almond Lint, MD;  Location: [facility];  Service: General;  Laterality: Left;  End: 1512   BREAST LUMPECTOMY WITH NEEDLE LOCALIZATION Left 02/04/2013   Procedure: LEFT BREAST LUMPECTOMY WITH NEEDLE LOCALIZATION;  Surgeon: Almond Lint, MD;  Location: [facility];  Service: General;  Laterality: Left;   BREAST SURGERY     Lumpectomy in april 2014   HEMORRHOID SURGERY  04/2011   ligation   MASTECTOMY Left 02/15/2017   PORT-A-CATH REMOVAL N/A 04/16/2014   Procedure: REMOVAL PORT-A-CATH;  Surgeon: Almond Lint, MD;  Location: [facility];  Service: General;  Laterality: N/A;   PORTACATH PLACEMENT Right 02/04/2013   Procedure: INSERTION PORT-A-CATH;  Surgeon: Almond Lint, MD;  Location: [facility];  Service: General;  Laterality: Right;  Start Time: 4696.   SHOULDER ARTHROSCOPY WITH ROTATOR CUFF REPAIR AND SUBACROMIAL DECOMPRESSION Left 02/24/2014   Procedure: SHOULDER ARTHROSCOPY WITH ROTATOR CUFF REPAIR AND SUBACROMIAL DECOMPRESSION;  Surgeon: Cammy Copa, MD;  Location: [facility] OR;  Service: Orthopedics;  Laterality: Left;  LEFT SHOULDER DIAGNOSTIC OPERATIVE ARTHROSCOPY, SUBACROMIAL DECOMPRESSION, ROTATOR  CUFF TEAR REPAIR   SIMPLE MASTECTOMY WITH AXILLARY SENTINEL NODE BIOPSY Left 02/15/2017   Procedure: LEFT MASTECTOMY;  Surgeon: Almond Lint, MD;  Location: [facility];  Service: General;  Laterality: Left;   TOTAL MASTECTOMY Right 12/26/2018   Procedure: RIGHT BREAST PROPHYLATIC MASTECTOMY;  Surgeon: Almond Lint, MD;  Location: [facility];  Service: General;  Laterality: Right;   Past Medical History:  Diagnosis Date   Abnormal gait 12/17/2019   Acute blood loss anemia    Anal fissure 05/03/2011   Anal pain 04/27/2021   Anemia    Iron deficinecy anemia   Anemia 05/14/2017   Anxiety and depression 05/15/2014   Arthritis    Body mass index (BMI) 30.0-30.9, adult 12/17/2019   Breast cancer (HCC)    left ,last radiation 2'15, last chemo 8'14   Cervical cancer screening 06/18/2018   Menarche at 11 Regular and moderate flow  history of abnormal pap in past, 1 mild abnormality years ago that resolved spontaneously on recheck G1P1, s/p 1 svd history of abnormal MGM, b/o breast cancer 2014 No concerns today  gyn surgeries. Lumpectomy  LMP around early 2014   Cervical stenosis of spine 12/17/2019   Constipation 11/13/2016   Contracture of axilla 05/20/2013   Debility 01/18/2020   Essential (primary) hypertension 12/17/2019   Flushing 02/20/2014   Gait instability 12/17/2019   Genetic testing 02/20/2019   Negative genetic testing on the Comprehensive Cancer Panel.  The Comprehensive Common Cancer Panel offered by [facility] includes sequencing and/or deletion duplication testing of the following 46 genes: APC, ATM, AXIN2, BAP1, BARD1, BMPR1A, BRCA1, BRCA2, BRIP1, CDH1, CDK4, CDKN2A, CHEK2, EPCAM, FANCC, FH, FLCN, HOXB13, MET, MITF,  MLH1, MSH2, MSH6, MUTYH, NBN, NF1, NTHL1,  PALB2, PMS2, POLD1, POLE, P   H/O: CVA (cerebrovascular accident) 06/19/2020   Hereditary and idiopathic peripheral neuropathy 11/11/2013   Herpes zoster 05/15/2014   History of chicken pox    History of left breast cancer 02/15/2017  History of radiation therapy 09/09/13-10/28/13   45 gray to left breast, lumpectomy cavity boosted to 63 gray   HTN (hypertension) 11/13/2016   Hyperglycemia 01/09/2016   Hyperlipidemia 05/14/2017   Hypertension    Internal hemorrhoids without complication 05/03/2011   Knee pain, bilateral 11/14/2016   Left upper extremity numbness 06/19/2018   Major depressive disorder, recurrent episode, severe (HCC) 01/12/2014   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 Overview:   Left IDC, ER 3%, PR-, Her2neu-  Last Assessment & Plan:  Pt will continue to take her tamoxifen as previously directed. She appears to be tolerating this with only c/o moderate, occ hot flashes.   Mild neurocognitive disorder due to multiple etiologies 03/17/2020   MRSA (methicillin resistant Staphylococcus aureus) 2009   right groin area-no issues now. 04-07-14 PCR screen negative today.   Neuropathy    Neuropathy of hand, left 09/21/2015   Nodule of finger of left hand 11/14/2016   Palpitations 07/30/2020   Personal history of colonic polyps 04/27/2021   Preventative health care 11/13/2016   Right hip pain 11/14/2016   S/P mastectomy, right 12/26/2018   Sebaceous cyst 04/12/2013   Shoulder joint pain 12/12/2013   Spinal stenosis in cervical region 10/09/2019   Strain of rotator cuff 02/24/2014   Stroke (HCC) 06/2020   Vertigo    BP 116/72   Pulse 60   Ht 5\' 6"  (1.676 m)   Wt 161 lb 3.2 oz (73.1 kg)   LMP 01/20/2013   SpO2 95%   BMI 26.02 kg/m   Opioid Risk Score:   Fall Risk Score:  `1  Depression screen PHQ 2/9     03/29/2023    1:17 PM 02/15/2023   12:46 PM 01/04/2023    2:38 PM 11/30/2022    2:24 PM 09/14/2022    1:44 PM 08/03/2022    1:19 PM 07/24/2022    1:36 PM  Depression screen PHQ 2/9  Decreased Interest 0 0 1 0 0 0 0  Down, Depressed, Hopeless 0 0 1 0 0 0 0  PHQ -  2 Score 0 0 2 0 0 0 0     Review of Systems  Constitutional: Negative.   HENT: Negative.    Eyes: Negative.   Respiratory: Negative.    Cardiovascular: Negative.   Gastrointestinal: Negative.   Endocrine: Negative.   Genitourinary: Negative.   Musculoskeletal:  Positive for arthralgias.       Shoulders and feet bilateral and left hand  Skin: Negative.   Allergic/Immunologic: Negative.   Neurological: Negative.   Hematological: Negative.   Psychiatric/Behavioral: Negative.    All other systems reviewed and are negative.      Objective:   Physical Exam Vitals and nursing note reviewed.  Constitutional:      Appearance: Normal appearance.  Cardiovascular:     Rate and Rhythm: Normal rate and regular rhythm.     Pulses: Normal pulses.     Heart sounds: Normal heart sounds.  Pulmonary:     Effort: Pulmonary effort is normal.     Breath sounds: Normal breath sounds.  Musculoskeletal:     Cervical back: Normal range of motion and neck supple.     Comments: Normal Muscle Bulk and Muscle Testing Reveals:  Upper Extremities: Full ROM and Muscle Strength 5/5 Left Upper Extremity: Lymphedema  Lower Extremities: Full ROM and Muscle Strength  5/5 Arises from Table with Ease Narrow Based  Gait     Skin:    General: Skin is warm and dry.  Neurological:     Mental Status: She is alert and oriented to person, place, and time.  Psychiatric:        Mood and Affect: Mood normal.        Behavior: Behavior normal.         Assessment & Plan:  1. Chemotherapy Induced Peripheral Neuropathy: Continue current medication regimen with Gabapentin. Continue to Monitor. 03/29/2023 2. Chronic Pain Syndrome: Refilled: Tramadol 50 mg one tablet twice a day for breakthrough pain #60 and   Hydrocodone 10/325 mg one tablet4 times a day as needed for pain #120. Second script sent to accommodate scheduled appointment. We will continue the opioid monitoring program, this consists of regular clinic  visits, examinations, urine drug screen, pill counts as well as use of [place] Controlled Substance Reporting system. A 12 month History has been reviewed on the [place] Controlled Substance Reporting System on 03/29/2023 3. Polyarthralgia: Continue HEP as Tolerated: Continue to Monitor. 03/29/2023 4. Benign Paroxysmal Positional Vertigo: Orthostatic Blood Pressures were Obtain. This provider spoke with Dr Carlis Abbott regarding the Vertigo as well. Dr Carlis Abbott wants Ms. Willetts to speak with her PCP regarding her anti-hypertensive medication as well. Ms. Tenbroeck verbalizes understanding. She refuses Therapy. She would like to speak to her PCP.  F/U in 2 months

## 2023-05-14 ENCOUNTER — Other Ambulatory Visit: Payer: Self-pay | Admitting: Cardiology

## 2023-05-14 ENCOUNTER — Other Ambulatory Visit: Payer: Self-pay | Admitting: Registered Nurse

## 2023-05-14 DIAGNOSIS — I10 Essential (primary) hypertension: Secondary | ICD-10-CM

## 2023-05-15 MED ORDER — GABAPENTIN 300 MG PO CAPS: 900.0000 mg | ORAL_CAPSULE | Freq: Every day | ORAL | 3 refills | Status: DC

## 2023-05-15 MED ORDER — GABAPENTIN 600 MG PO TABS: 600.0000 mg | ORAL_TABLET | Freq: Two times a day (BID) | ORAL | 3 refills | Status: DC

## 2023-05-16 ENCOUNTER — Other Ambulatory Visit: Payer: Self-pay | Admitting: Family Medicine

## 2023-05-16 ENCOUNTER — Telehealth: Payer: Self-pay

## 2023-05-16 ENCOUNTER — Telehealth: Payer: Self-pay | Admitting: Family Medicine

## 2023-05-16 MED ORDER — VALSARTAN-HYDROCHLOROTHIAZIDE 320-25 MG PO TABS: ORAL_TABLET | ORAL | 1 refills | Status: DC

## 2023-05-16 NOTE — Telephone Encounter (Signed)
Message sent to provider 

## 2023-05-16 NOTE — Telephone Encounter (Signed)
Pt said she no longer sees Dr. Tomie China but needs refill on her valsartan 160 mg. Please send to [facility] [address] in [place]. Please advise pt if Dr. Abner Greenspan is able to refill this for her

## 2023-05-17 NOTE — Telephone Encounter (Signed)
Pt has a appt with Melissa next month.

## 2023-05-25 ENCOUNTER — Other Ambulatory Visit: Payer: Self-pay | Admitting: Family Medicine

## 2023-05-31 ENCOUNTER — Encounter: Payer: Self-pay | Admitting: Registered Nurse

## 2023-05-31 ENCOUNTER — Encounter: Payer: Medicare Other | Attending: Physical Medicine and Rehabilitation | Admitting: Registered Nurse

## 2023-05-31 VITALS — BP 128/76 | HR 52 | Ht 66.0 in | Wt 159.0 lb

## 2023-05-31 DIAGNOSIS — M2550 Pain in unspecified joint: Secondary | ICD-10-CM | POA: Diagnosis not present

## 2023-05-31 DIAGNOSIS — G8929 Other chronic pain: Secondary | ICD-10-CM | POA: Insufficient documentation

## 2023-05-31 DIAGNOSIS — G894 Chronic pain syndrome: Secondary | ICD-10-CM | POA: Insufficient documentation

## 2023-05-31 DIAGNOSIS — R001 Bradycardia, unspecified: Secondary | ICD-10-CM | POA: Diagnosis not present

## 2023-05-31 DIAGNOSIS — G620 Drug-induced polyneuropathy: Secondary | ICD-10-CM | POA: Diagnosis not present

## 2023-05-31 DIAGNOSIS — Z5181 Encounter for therapeutic drug level monitoring: Secondary | ICD-10-CM | POA: Insufficient documentation

## 2023-05-31 DIAGNOSIS — M25512 Pain in left shoulder: Secondary | ICD-10-CM | POA: Insufficient documentation

## 2023-05-31 DIAGNOSIS — Z79891 Long term (current) use of opiate analgesic: Secondary | ICD-10-CM | POA: Diagnosis not present

## 2023-05-31 DIAGNOSIS — T451X5A Adverse effect of antineoplastic and immunosuppressive drugs, initial encounter: Secondary | ICD-10-CM | POA: Diagnosis not present

## 2023-05-31 DIAGNOSIS — I890 Lymphedema, not elsewhere classified: Secondary | ICD-10-CM | POA: Insufficient documentation

## 2023-05-31 MED ORDER — TRAMADOL HCL 50 MG PO TABS: 50.0000 mg | ORAL_TABLET | Freq: Two times a day (BID) | ORAL | 1 refills | Status: DC | PRN

## 2023-05-31 NOTE — Progress Notes (Signed)
Subjective:    Patient ID: Kristi [name], female    DOB: [birth year], 67 y.o.   MRN: [id number]  HPI: Kristi [name] is a 67 y.o. female who returns for follow up appointment for chronic pain and medication refill. She states her pain is located inher left shoulder and bilateral feet with tingling and burning. She  rates her pain 4. Her current exercise regime is walking and performing stretching exercises.  Ms. Trawick Morphine equivalent is 60.00 MME.  Last UDS was Performed on 02/15/2023, it was consistent.    Pain Inventory Average Pain 4 Pain Right Now 4 My pain is sharp, burning, tingling, and aching  In the last 24 hours, has pain interfered with the following? General activity 4 Relation with others 3 Enjoyment of life 3 What TIME of day is your pain at its worst? morning , daytime, and evening Sleep (in general) Good  Pain is worse with: walking, sitting, inactivity, and standing Pain improves with: therapy/exercise and medication Relief from Meds: 6  Family History  Problem Relation Age of Onset  . Lung cancer Father   . Hypertension Father   . Thyroid cancer Father        dx in his 81s  . Cancer Father        lung, thyroid, smoker  . Breast cancer Paternal Aunt 30  . Colon cancer Paternal Aunt        dx in her 50x  . Cervical cancer Paternal Aunt        dzx in her 44s  . Ovarian cancer Cousin        dx in her lage 79s  . Breast cancer Cousin        maternal first cousin, once removed; dx in her late 91s  . Breast cancer Cousin        maternal first cousin once removed; dx in late 45s  . Hypertension Mother   . Diabetes Mother   . Dementia Mother   . Memory loss Mother   . Hypertension Brother   . Seizures Brother        Alcohol induced.  . Alcohol abuse Brother        drinker, smoker  . COPD Brother   . Cancer Paternal Uncle        oral cancer  . Kidney cancer Paternal Grandmother   . Arthritis Daughter        back surgery  . Cancer Cousin         several paternal cousins with brain cancer, leukemia, and other cancers  . Cancer Sister        stomach   Social History   Socioeconomic History  . Marital status: Married    Spouse name: [name]  . Number of children: 1  . Years of education: 29  . Highest education level: Not on file  Occupational History  . Occupation: INVENTORY Office manager: [employer]  . Occupation: disability no longer working 2015  Tobacco Use  . Smoking status: Never  . Smokeless tobacco: Never  Vaping Use  . Vaping status: Never Used  Substance and Sexual Activity  . Alcohol use: No  . Drug use: No  . Sexual activity: Yes    Comment: lives with husband, disability/retirement. [employer], no dietary restrictions  Other Topics Concern  . Not on file  Social History Narrative   Patient is married [name]) and lives at home with her husband.  Patient has one daughter   Patient drinks very little caffeine.   Left handed   Social Determinants of Health   Financial Resource Strain: Low Risk  (07/24/2022)   Overall Financial Resource Strain (CARDIA)   . Difficulty of Paying Living Expenses: Not hard at all  Food Insecurity: No Food Insecurity (07/24/2022)   Hunger Vital Sign   . Worried About Programme researcher, broadcasting/film/video in the Last Year: Never true   . Ran Out of Food in the Last Year: Never true  Transportation Needs: No Transportation Needs (07/24/2022)   PRAPARE - Transportation   . Lack of Transportation (Medical): No   . Lack of Transportation (Non-Medical): No  Physical Activity: Sufficiently Active (07/24/2022)   Exercise Vital Sign   . Days of Exercise per Week: 6 days   . Minutes of Exercise per Session: 140 min  Stress: No Stress Concern Present (07/24/2022)   [employer] of Occupational Health - Occupational Stress Questionnaire   . Feeling of Stress : Not at all  Social Connections: Socially Integrated (07/24/2022)   Social Connection and Isolation Panel  [NHANES]   . Frequency of Communication with Friends and Family: More than three times a week   . Frequency of Social Gatherings with Friends and Family: Once a week   . Attends Religious Services: 1 to 4 times per year   . Active Member of Clubs or Organizations: Yes   . Attends Banker Meetings: More than 4 times per year   . Marital Status: Married   Past Surgical History:  Procedure Laterality Date  . ANAL SPHINCTEROTOMY  04/2011  . APPENDECTOMY  1980  . AXILLARY LYMPH NODE DISSECTION Left 02/04/2013   Procedure: LEFT AXILLARY LYMPH NODE DISSECTION;  Surgeon: Almond Lint, MD;  Location: [facility];  Service: General;  Laterality: Left;  End: 1512  . BREAST LUMPECTOMY WITH NEEDLE LOCALIZATION Left 02/04/2013   Procedure: LEFT BREAST LUMPECTOMY WITH NEEDLE LOCALIZATION;  Surgeon: Almond Lint, MD;  Location: [facility];  Service: General;  Laterality: Left;  . BREAST SURGERY     Lumpectomy in april 2014  . HEMORRHOID SURGERY  04/2011   ligation  . MASTECTOMY Left 02/15/2017  . PORT-A-CATH REMOVAL N/A 04/16/2014   Procedure: REMOVAL PORT-A-CATH;  Surgeon: Almond Lint, MD;  Location: [facility];  Service: General;  Laterality: N/A;  . PORTACATH PLACEMENT Right 02/04/2013   Procedure: INSERTION PORT-A-CATH;  Surgeon: Almond Lint, MD;  Location: [facility];  Service: General;  Laterality: Right;  Start Time: 1538.  Marland Kitchen SHOULDER ARTHROSCOPY WITH ROTATOR CUFF REPAIR AND SUBACROMIAL DECOMPRESSION Left 02/24/2014   Procedure: SHOULDER ARTHROSCOPY WITH ROTATOR CUFF REPAIR AND SUBACROMIAL DECOMPRESSION;  Surgeon: Cammy Copa, MD;  Location: [facility];  Service: Orthopedics;  Laterality: Left;  LEFT SHOULDER DIAGNOSTIC OPERATIVE ARTHROSCOPY, SUBACROMIAL DECOMPRESSION, ROTATOR CUFF TEAR REPAIR  . SIMPLE MASTECTOMY WITH AXILLARY SENTINEL NODE BIOPSY Left 02/15/2017   Procedure: LEFT MASTECTOMY;  Surgeon: Almond Lint, MD;  Location: [facility];  Service: General;  Laterality: Left;  . TOTAL MASTECTOMY Right  12/26/2018   Procedure: RIGHT BREAST PROPHYLATIC MASTECTOMY;  Surgeon: Almond Lint, MD;  Location: [facility];  Service: General;  Laterality: Right;   Past Surgical History:  Procedure Laterality Date  . ANAL SPHINCTEROTOMY  04/2011  . APPENDECTOMY  1980  . AXILLARY LYMPH NODE DISSECTION Left 02/04/2013   Procedure: LEFT AXILLARY LYMPH NODE DISSECTION;  Surgeon: Almond Lint, MD;  Location: [facility];  Service: General;  Laterality: Left;  End: 1512  .  BREAST LUMPECTOMY WITH NEEDLE LOCALIZATION Left 02/04/2013   Procedure: LEFT BREAST LUMPECTOMY WITH NEEDLE LOCALIZATION;  Surgeon: Almond Lint, MD;  Location: [facility];  Service: General;  Laterality: Left;  . BREAST SURGERY     Lumpectomy in april 2014  . HEMORRHOID SURGERY  04/2011   ligation  . MASTECTOMY Left 02/15/2017  . PORT-A-CATH REMOVAL N/A 04/16/2014   Procedure: REMOVAL PORT-A-CATH;  Surgeon: Almond Lint, MD;  Location: [facility];  Service: General;  Laterality: N/A;  . PORTACATH PLACEMENT Right 02/04/2013   Procedure: INSERTION PORT-A-CATH;  Surgeon: Almond Lint, MD;  Location: [facility];  Service: General;  Laterality: Right;  Start Time: 1538.  Marland Kitchen SHOULDER ARTHROSCOPY WITH ROTATOR CUFF REPAIR AND SUBACROMIAL DECOMPRESSION Left 02/24/2014   Procedure: SHOULDER ARTHROSCOPY WITH ROTATOR CUFF REPAIR AND SUBACROMIAL DECOMPRESSION;  Surgeon: Cammy Copa, MD;  Location: [facility] OR;  Service: Orthopedics;  Laterality: Left;  LEFT SHOULDER DIAGNOSTIC OPERATIVE ARTHROSCOPY, SUBACROMIAL DECOMPRESSION, ROTATOR CUFF TEAR REPAIR  . SIMPLE MASTECTOMY WITH AXILLARY SENTINEL NODE BIOPSY Left 02/15/2017   Procedure: LEFT MASTECTOMY;  Surgeon: Almond Lint, MD;  Location: [facility];  Service: General;  Laterality: Left;  . TOTAL MASTECTOMY Right 12/26/2018   Procedure: RIGHT BREAST PROPHYLATIC MASTECTOMY;  Surgeon: Almond Lint, MD;  Location: [facility];  Service: General;  Laterality: Right;   Past Medical History:  Diagnosis Date  . Abnormal gait 12/17/2019  . Acute blood  loss anemia   . Anal fissure 05/03/2011  . Anal pain 04/27/2021  . Anemia    Iron deficinecy anemia  . Anemia 05/14/2017  . Anxiety and depression 05/15/2014  . Arthritis   . Body mass index (BMI) 30.0-30.9, adult 12/17/2019  . Breast cancer (HCC)    left ,last radiation 2'15, last chemo 8'14  . Cervical cancer screening 06/18/2018   Menarche at 11 Regular and moderate flow  history of abnormal pap in past, 1 mild abnormality years ago that resolved spontaneously on recheck G1P1, s/p 1 svd history of abnormal MGM, b/o breast cancer 2014 No concerns today  gyn surgeries. Lumpectomy  LMP around early 2014  . Cervical stenosis of spine 12/17/2019  . Constipation 11/13/2016  . Contracture of axilla 05/20/2013  . Debility 01/18/2020  . Essential (primary) hypertension 12/17/2019  . Flushing 02/20/2014  . Gait instability 12/17/2019  . Genetic testing 02/20/2019   Negative genetic testing on the Comprehensive Cancer Panel.  The Comprehensive Common Cancer Panel offered by [facility] includes sequencing and/or deletion duplication testing of the following 46 genes: APC, ATM, AXIN2, BAP1, BARD1, BMPR1A, BRCA1, BRCA2, BRIP1, CDH1, CDK4, CDKN2A, CHEK2, EPCAM, FANCC, FH, FLCN, HOXB13, MET, MITF,  MLH1, MSH2, MSH6, MUTYH, NBN, NF1, NTHL1,  PALB2, PMS2, POLD1, POLE, P  . H/O: CVA (cerebrovascular accident) 06/19/2020  . Hereditary and idiopathic peripheral neuropathy 11/11/2013  . Herpes zoster 05/15/2014  . History of chicken pox   . History of left breast cancer 02/15/2017  . History of radiation therapy 09/09/13-10/28/13   45 gray to left breast, lumpectomy cavity boosted to 63 gray  . HTN (hypertension) 11/13/2016  . Hyperglycemia 01/09/2016  . Hyperlipidemia 05/14/2017  . Hypertension   . Internal hemorrhoids without complication 05/03/2011  . Knee pain, bilateral 11/14/2016  . Left upper extremity numbness 06/19/2018  . Major depressive disorder, recurrent episode, severe (HCC) 01/12/2014  .  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 Overview:  Left IDC, ER 3%, PR-, Her2neu-  Last Assessment & Plan:  Pt will continue to take her tamoxifen as previously directed. She appears to be tolerating this with only c/o moderate, occ hot flashes.  . Mild neurocognitive disorder due to multiple etiologies 03/17/2020  . MRSA (methicillin resistant Staphylococcus aureus) 2009   right groin area-no issues now. 04-07-14 PCR screen negative today.  . Neuropathy   . Neuropathy of hand, left 09/21/2015  . Nodule of finger of left hand 11/14/2016  . Palpitations 07/30/2020  . Personal history of colonic polyps 04/27/2021  . Preventative health care 11/13/2016  . Right hip pain 11/14/2016  . S/P mastectomy, right 12/26/2018  . Sebaceous cyst 04/12/2013  . Shoulder joint pain 12/12/2013  . Spinal stenosis in cervical region 10/09/2019  . Strain of rotator cuff 02/24/2014  . Stroke (HCC) 06/2020  . Vertigo    BP 128/76   Pulse (!) 52   Ht 5\' 6"  (1.676 m)   Wt 159 lb (72.1 kg)   LMP 01/20/2013   SpO2 96%   BMI 25.66 kg/m   Opioid Risk Score:   Fall Risk Score:  `1  Depression screen PHQ 2/9     03/29/2023    1:17 PM 02/15/2023   12:46 PM 01/04/2023    2:38 PM 11/30/2022    2:24 PM 09/14/2022    1:44 PM 08/03/2022    1:19 PM 07/24/2022    1:36 PM  Depression screen PHQ 2/9  Decreased Interest 0 0 1 0 0 0 0  Down, Depressed, Hopeless 0 0 1 0 0 0 0  PHQ - 2 Score 0 0 2 0 0 0 0     Review of Systems  Musculoskeletal:        Bilateral hand and foot pain  All other systems reviewed and are negative.     Objective:   Physical Exam Vitals and nursing note reviewed.  Constitutional:      Appearance: Normal appearance.  Cardiovascular:     Rate and Rhythm: Regular rhythm. Bradycardia present.     Pulses: Normal pulses.     Heart sounds: Normal heart  sounds.  Pulmonary:     Effort: Pulmonary effort is normal.     Breath sounds: Normal breath sounds.  Musculoskeletal:     Cervical back: Normal range of motion and neck supple.     Comments: Normal Muscle Bulk and Muscle Testing Reveals:  Upper Extremities: Full ROM and Muscle Strength 5/5 Lower Extremities: Full ROM and Muscle Strength 5/5 Arises from Table with ease Narrow Based  Gait     Skin:    General: Skin is warm and dry.  Neurological:     Mental Status: She is alert.  Psychiatric:        Mood and Affect: Mood normal.        Behavior: Behavior normal.         Assessment & Plan:  1. Chemotherapy Induced Peripheral Neuropathy: Continue current medication regimen with Gabapentin. Continue to Monitor. 05/31/2023 2. Chronic Pain Syndrome: Refilled: Tramadol 50 mg one tablet twice a day for breakthrough pain #60 and  Continue Hydrocodone 10/325 mg one tablet4 times a day as needed for pain #120. We will continue the opioid monitoring program, this consists of regular clinic visits, examinations, urine drug screen, pill counts as well as use of [place] Controlled Substance Reporting system. A 12 month History has been reviewed on the [place] Controlled Substance Reporting System on 05/31/2023 3. Polyarthralgia:  Continue HEP as Tolerated: Continue to Monitor. 05/31/2023 4. Bradycardia: Apical Pulse Checked. She will keep a vital signs log and F/U with her PCP. She verbalizes understanding.    F/U in 2 months

## 2023-06-08 ENCOUNTER — Other Ambulatory Visit: Payer: Self-pay | Admitting: Family Medicine

## 2023-06-08 ENCOUNTER — Ambulatory Visit (INDEPENDENT_AMBULATORY_CARE_PROVIDER_SITE_OTHER): Payer: Medicare Other | Admitting: Family

## 2023-06-08 VITALS — BP 126/61 | HR 65 | Temp 98.6°F | Resp 16 | Wt 160.0 lb

## 2023-06-08 DIAGNOSIS — R42 Dizziness and giddiness: Secondary | ICD-10-CM

## 2023-06-08 DIAGNOSIS — R739 Hyperglycemia, unspecified: Secondary | ICD-10-CM

## 2023-06-08 DIAGNOSIS — I890 Lymphedema, not elsewhere classified: Secondary | ICD-10-CM

## 2023-06-08 DIAGNOSIS — I10 Essential (primary) hypertension: Secondary | ICD-10-CM | POA: Diagnosis not present

## 2023-06-08 DIAGNOSIS — H8112 Benign paroxysmal vertigo, left ear: Secondary | ICD-10-CM

## 2023-06-08 DIAGNOSIS — Z8673 Personal history of transient ischemic attack (TIA), and cerebral infarction without residual deficits: Secondary | ICD-10-CM

## 2023-06-08 DIAGNOSIS — E782 Mixed hyperlipidemia: Secondary | ICD-10-CM | POA: Diagnosis not present

## 2023-06-08 MED ORDER — VALSARTAN 160 MG PO TABS
160.0000 mg | ORAL_TABLET | Freq: Every day | ORAL | 1 refills | Status: DC
Start: 2023-06-08 — End: 2023-12-07

## 2023-06-08 NOTE — Assessment & Plan Note (Signed)
  Well controlled on Valsartan. Blood pressure today 126/61. -Continue Valsartan. -Refill prescription for 90-day supply to be picked up at [facility] on [address]

## 2023-06-08 NOTE — Telephone Encounter (Signed)
Appt today

## 2023-06-08 NOTE — Assessment & Plan Note (Signed)
History of breast cancer with resultant lymphedema. Previously received treatment but found it expensive. -No change in plan. Revisit treatment options if symptoms worsen.

## 2023-06-08 NOTE — Assessment & Plan Note (Signed)
Continue aspirin 325mg  and atorvastatin for secondary stroke prevention.

## 2023-06-08 NOTE — Progress Notes (Signed)
Subjective:     Patient ID: Kristi [name], female    DOB: 1955-11-29, 67 y.o.   MRN: [id number]  Chief Complaint  Patient presents with   Hypertension    "Need prescription for Valsartan 160 mg"    Hypertension    Discussed the use of AI scribe software for clinical note transcription with the patient, who gave verbal consent to proceed.  History of Present Illness   Kristi [name], a patient with a history of hypertension, breast cancer with bilateral mastectomy, and lymphedema, presents for a medication refill and follow-up. She reports no new symptoms or concerns related to her hypertension and her blood pressure is well controlled on valsartan. She mentions a previous concern about a low pulse rate, but it is currently within normal limits.   She had previously sought treatment for the lymphedema but found it too expensive. She has no current plans to pursue further treatment for this condition.  Recently, she has been experiencing vertigo when lying on her left side at night. The vertigo is mild and she has not sought treatment for it. She also reports a history of stroke and is on aspirin for stroke prevention. She has been taking atorvastatin for cholesterol management and trazodone for sleep, both of which she finds helpful. She also takes Abilify, prescribed by Secundino Ginger.   She has a history of diabetes, which has been in the high normal range, and she is not currently taking vitamin D supplements. She has been adherent to recommended screenings, including colonoscopy and bone density tests. She plans to get her flu shot and COVID booster in the fall. She has not yet received a pneumonia shot.          Health Maintenance Due  Topic Date Due   Hepatitis C Screening  Never done   DTaP/Tdap/Td (1 - Tdap) Never done   Zoster Vaccines- Shingrix (1 of 2) Never done   Pneumonia Vaccine 62+ Years old (1 of 1 - PCV) Never done   COVID-19 Vaccine (6 - 2023-24 season) 09/07/2022    INFLUENZA VACCINE  06/07/2023   Medicare Annual Wellness (AWV)  07/25/2023    Past Medical History:  Diagnosis Date   Abnormal gait 12/17/2019   Acute blood loss anemia    Anal fissure 05/03/2011   Anal pain 04/27/2021   Anemia    Iron deficinecy anemia   Anemia 05/14/2017   Anxiety and depression 05/15/2014   Arthritis    Body mass index (BMI) 30.0-30.9, adult 12/17/2019   Breast cancer (HCC)    left ,last radiation 2'15, last chemo 8'14   Cervical cancer screening 06/18/2018   Menarche at 11 Regular and moderate flow  history of abnormal pap in past, 1 mild abnormality years ago that resolved spontaneously on recheck G1P1, s/p 1 svd history of abnormal MGM, b/o breast cancer 2014 No concerns today  gyn surgeries. Lumpectomy  LMP around early 2014   Cervical stenosis of spine 12/17/2019   Constipation 11/13/2016   Contracture of axilla 05/20/2013   Debility 01/18/2020   Essential (primary) hypertension 12/17/2019   Flushing 02/20/2014   Gait instability 12/17/2019   Genetic testing 02/20/2019   Negative genetic testing on the Comprehensive Cancer Panel.  The Comprehensive Common Cancer Panel offered by [facility] includes sequencing and/or deletion duplication testing of the following 46 genes: APC, ATM, AXIN2, BAP1, BARD1, BMPR1A, BRCA1, BRCA2, BRIP1, CDH1, CDK4, CDKN2A, CHEK2, EPCAM, FANCC, FH, FLCN, HOXB13, MET, MITF,  MLH1, MSH2, MSH6, MUTYH, NBN, NF1,  NTHL1,  PALB2, PMS2, POLD1, POLE, P   H/O: CVA (cerebrovascular accident) 06/19/2020   Hereditary and idiopathic peripheral neuropathy 11/11/2013   Herpes zoster 05/15/2014   History of chicken pox    History of left breast cancer 02/15/2017   History of radiation therapy 09/09/13-10/28/13   45 gray to left breast, lumpectomy cavity boosted to 63 gray   HTN (hypertension) 11/13/2016   Hyperglycemia 01/09/2016   Hyperlipidemia 05/14/2017   Hypertension    Internal hemorrhoids without complication 05/03/2011   Knee pain,  bilateral 11/14/2016   Left upper extremity numbness 06/19/2018   Major depressive disorder, recurrent episode, severe (HCC) 01/12/2014   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) 01/24/2013    Left IDC, ER 3%, PR-, Her2neu-  Formatting of this note might be different from the original. Overview:   Left IDC, ER 3%, PR-, Her2neu-  Last Assessment & Plan:  Pt will continue to take her tamoxifen as previously directed. She appears to be tolerating this with only c/o moderate, occ hot flashes.   Mild neurocognitive disorder due to multiple etiologies 03/17/2020   MRSA (methicillin resistant Staphylococcus aureus) 2009   right groin area-no issues now. 04-07-14 PCR screen negative today.   Neuropathy    Neuropathy of hand, left 09/21/2015   Nodule of finger of left hand 11/14/2016   Palpitations 07/30/2020   Personal history of colonic polyps 04/27/2021   Preventative health care 11/13/2016   Right hip pain 11/14/2016   S/P mastectomy, right 12/26/2018   Sebaceous cyst 04/12/2013   Shoulder joint pain 12/12/2013   Spinal stenosis in cervical region 10/09/2019   Strain of rotator cuff 02/24/2014   Stroke (HCC) 06/2020   Vertigo     Past Surgical History:  Procedure Laterality Date   ANAL SPHINCTEROTOMY  04/2011   APPENDECTOMY  1980   AXILLARY LYMPH NODE DISSECTION Left 02/04/2013   Procedure: LEFT AXILLARY LYMPH NODE DISSECTION;  Surgeon: Almond Lint, MD;  Location: [facility] OR;  Service: General;  Laterality: Left;  End: 1512   BREAST LUMPECTOMY WITH NEEDLE LOCALIZATION Left 02/04/2013   Procedure: LEFT BREAST LUMPECTOMY WITH NEEDLE LOCALIZATION;  Surgeon: Almond Lint, MD;  Location: [facility] OR;  Service: General;  Laterality: Left;   BREAST SURGERY     Lumpectomy in april 2014   HEMORRHOID SURGERY  04/2011   ligation   MASTECTOMY Left 02/15/2017   PORT-A-CATH REMOVAL N/A 04/16/2014   Procedure: REMOVAL PORT-A-CATH;  Surgeon: Almond Lint, MD;  Location: [facility];  Service:  General;  Laterality: N/A;   PORTACATH PLACEMENT Right 02/04/2013   Procedure: INSERTION PORT-A-CATH;  Surgeon: Almond Lint, MD;  Location: [facility] OR;  Service: General;  Laterality: Right;  Start Time: 1027.   SHOULDER ARTHROSCOPY WITH ROTATOR CUFF REPAIR AND SUBACROMIAL DECOMPRESSION Left 02/24/2014   Procedure: SHOULDER ARTHROSCOPY WITH ROTATOR CUFF REPAIR AND SUBACROMIAL DECOMPRESSION;  Surgeon: Cammy Copa, MD;  Location: [facility] OR;  Service: Orthopedics;  Laterality: Left;  LEFT SHOULDER DIAGNOSTIC OPERATIVE ARTHROSCOPY, SUBACROMIAL DECOMPRESSION, ROTATOR CUFF TEAR REPAIR   SIMPLE MASTECTOMY WITH AXILLARY SENTINEL NODE BIOPSY Left 02/15/2017   Procedure: LEFT MASTECTOMY;  Surgeon: Almond Lint, MD;  Location: [facility] OR;  Service: General;  Laterality: Left;   TOTAL MASTECTOMY Right 12/26/2018   Procedure: RIGHT BREAST PROPHYLATIC MASTECTOMY;  Surgeon: Almond Lint, MD;  Location: [facility] OR;  Service: General;  Laterality: Right;    Family History  Problem Relation Age of Onset   Lung cancer Father    Hypertension Father  Thyroid cancer Father        dx in his 56s   Cancer Father        lung, thyroid, smoker   Breast cancer Paternal Aunt 77   Colon cancer Paternal Aunt        dx in her 50x   Cervical cancer Paternal Aunt        dzx in her 39s   Ovarian cancer Cousin        dx in her lage 46s   Breast cancer Cousin        maternal first cousin, once removed; dx in her late 26s   Breast cancer Cousin        maternal first cousin once removed; dx in late 83s   Hypertension Mother    Diabetes Mother    Dementia Mother    Memory loss Mother    Hypertension Brother    Seizures Brother        Alcohol induced.   Alcohol abuse Brother        drinker, smoker   COPD Brother    Cancer Paternal Uncle        oral cancer   Kidney cancer Paternal Grandmother    Arthritis Daughter        back surgery   Cancer Cousin        several paternal cousins with brain cancer, leukemia, and other  cancers   Cancer Sister        stomach    Social History   Socioeconomic History   Marital status: Married    Spouse name: [name]   Number of children: 1   Years of education: 12   Highest education level: Not on file  Occupational History   Occupation: INVENTORY Office manager: [employer]   Occupation: disability no longer working 2015  Tobacco Use   Smoking status: Never   Smokeless tobacco: Never  Vaping Use   Vaping status: Never Used  Substance and Sexual Activity   Alcohol use: No   Drug use: No   Sexual activity: Yes    Comment: lives with husband, disability/retirement. [employer], no dietary restrictions  Other Topics Concern   Not on file  Social History Narrative   Patient is married [name]) and lives at home with her husband.   Patient has one daughter   Patient drinks very little caffeine.   Left handed   Social Determinants of Health   Financial Resource Strain: Low Risk  (07/24/2022)   Overall Financial Resource Strain (CARDIA)    Difficulty of Paying Living Expenses: Not hard at all  Food Insecurity: No Food Insecurity (07/24/2022)   Hunger Vital Sign    Worried About Running Out of Food in the Last Year: Never true    Ran Out of Food in the Last Year: Never true  Transportation Needs: No Transportation Needs (07/24/2022)   PRAPARE - Administrator, Civil Service (Medical): No    Lack of Transportation (Non-Medical): No  Physical Activity: Sufficiently Active (07/24/2022)   Exercise Vital Sign    Days of Exercise per Week: 6 days    Minutes of Exercise per Session: 140 min  Stress: No Stress Concern Present (07/24/2022)   [employer] of Occupational Health - Occupational Stress Questionnaire    Feeling of Stress : Not at all  Social Connections: Socially Integrated (07/24/2022)   Social Connection and Isolation Panel [NHANES]    Frequency of Communication  with Friends and Family: More than three times a  week    Frequency of Social Gatherings with Friends and Family: Once a week    Attends Religious Services: 1 to 4 times per year    Active Member of [employer] or Organizations: Yes    Attends Engineer, structural: More than 4 times per year    Marital Status: Married  Catering manager Violence: Not At Risk (07/24/2022)   Humiliation, Afraid, Rape, and Kick questionnaire    Fear of Current or Ex-Partner: No    Emotionally Abused: No    Physically Abused: No    Sexually Abused: No    Outpatient Medications Prior to Visit  Medication Sig Dispense Refill   anastrozole (ARIMIDEX) 1 MG tablet TAKE 1 TABLET BY MOUTH DAILY IN THE MORNING 90 tablet 2   Apoaequorin (PREVAGEN) 10 MG CAPS Take 1 capsule by mouth daily.     ARIPiprazole (ABILIFY) 5 MG tablet Take 5 mg by mouth at bedtime.     aspirin EC 325 MG tablet Take 1 tablet (325 mg total) by mouth daily. 30 tablet 0   atorvastatin (LIPITOR) 40 MG tablet TAKE 1 TABLET BY MOUTH EVERY DAY 90 tablet 1   Cholecalciferol 50 MCG (2000 UT) CAPS as directed Orally     gabapentin (NEURONTIN) 300 MG capsule Take 3 capsules (900 mg total) by mouth at bedtime. 90 capsule 3   gabapentin (NEURONTIN) 600 MG tablet Take 1 tablet (600 mg total) by mouth 2 (two) times daily. 60 tablet 3   HYDROcodone-acetaminophen (NORCO) 10-325 MG tablet Take 1 tablet by mouth 4 (four) times daily as needed. 120 tablet 0   hydrOXYzine (ATARAX) 50 MG tablet 1 ml as needed Orally twice daily     Multiple Vitamin (MULTIVITAMIN WITH MINERALS) TABS tablet Take 1 tablet by mouth daily.     traMADol (ULTRAM) 50 MG tablet Take 1 tablet (50 mg total) by mouth 2 (two) times daily as needed. Break through Pain 60 tablet 1   traZODone (DESYREL) 50 MG tablet 1 tablet at bedtime as needed Orally Once a day for 30 day(s)     Amitriptyline HCl (ELAVIL PO) 1 tablet by mouth once a day     valsartan-hydrochlorothiazide (DIOVAN HCT) 320-25 MG tablet 1 tablet Orally Once a day 30 tablet 1    polyethylene glycol (MIRALAX / GLYCOLAX) 17 g packet Take 17 g by mouth daily as needed for mild constipation. 14 each 0   pyridoxine (B-6) 100 MG tablet Orally Once a day     pyridOXINE (B-6) 50 MG tablet Take 50 mg by mouth daily.     valsartan (DIOVAN) 160 MG tablet Take 1 tablet (160 mg total) by mouth daily. Patient needs an appointment for further refills. 3 rd/final attempt 15 tablet 0   No facility-administered medications prior to visit.    No Known Allergies  ROS See HPI    Objective:    Physical Exam Constitutional:      General: She is not in acute distress.    Appearance: Normal appearance. She is well-developed.  HENT:     Head: Normocephalic and atraumatic.     Right Ear: External ear normal.     Left Ear: External ear normal.  Eyes:     General: No scleral icterus. Neck:     Thyroid: No thyromegaly.  Cardiovascular:     Rate and Rhythm: Normal rate and regular rhythm.     Heart sounds: Normal heart sounds. No  murmur heard. Pulmonary:     Effort: Pulmonary effort is normal. No respiratory distress.     Breath sounds: Normal breath sounds. No wheezing.  Musculoskeletal:     Cervical back: Neck supple.     Comments: Mild LUE lymphedema noted.   Skin:    General: Skin is warm and dry.  Neurological:     Mental Status: She is alert and oriented to person, place, and time.  Psychiatric:        Mood and Affect: Mood normal.        Behavior: Behavior normal.        Thought Content: Thought content normal.        Judgment: Judgment normal.      BP 126/61 (BP Location: Right Arm, Patient Position: Sitting, Cuff Size: Small)   Pulse 65   Temp 98.6 F (37 C) (Oral)   Resp 16   Wt 160 lb (72.6 kg)   LMP 01/20/2013   SpO2 99%   BMI 25.82 kg/m  Wt Readings from Last 3 Encounters:  06/08/23 160 lb (72.6 kg)  05/31/23 159 lb (72.1 kg)  03/29/23 161 lb 3.2 oz (73.1 kg)       Assessment & Plan:   Problem List Items Addressed This Visit        Unprioritized   Vertigo    Reports positional vertigo when lying on left side. Likely benign paroxysmal positional vertigo (BPPV). -Observe for now. If symptoms worsen or do not improve, consider referral for physical therapy for repositioning maneuvers.      Lymphedema    History of breast cancer with resultant lymphedema. Previously received treatment but found it expensive. -No change in plan. Revisit treatment options if symptoms worsen.      Hyperlipidemia - Primary    On Atorvastatin. Last cholesterol check was a year ago. -Continue Atorvastatin. -Order lipid panel.       Relevant Medications   valsartan (DIOVAN) 160 MG tablet   Other Relevant Orders   Lipid panel   Hyperglycemia   Relevant Orders   HgB A1c   HTN (hypertension)     Well controlled on Valsartan. Blood pressure today 126/61. -Continue Valsartan. -Refill prescription for 90-day supply to be picked up at [facility] on [address]      Relevant Medications   valsartan (DIOVAN) 160 MG tablet   Other Relevant Orders   Comp Met (CMET)   History of stroke    Continue aspirin 325mg  and atorvastatin for secondary stroke prevention.      Benign paroxysmal positional vertigo of left ear    Reports positional vertigo only when lying on left side. Likely benign paroxysmal positional vertigo (BPPV). -Observe for now. If symptoms worsen or do not improve, consider referral for physical therapy for vestibular rehab.       General Health Maintenance -Order labs to check kidney and liver function, and diabetes test (last results in high normal range). -Encourage flu shot and COVID booster in the fall. -Discussed Pneumonia vaccine, patient declined for now but can get at pharmacy. -Continue regular colonoscopies, next due in 2026. -Follow-up in six months with Dr. Abner Greenspan.   I have discontinued [name] "Theresa"'s polyethylene glycol, pyridOXINE, pyridoxine, Amitriptyline HCl (ELAVIL PO), and  valsartan-hydrochlorothiazide. I am also having her maintain her multivitamin with minerals, Prevagen, aspirin EC, ARIPiprazole, anastrozole, Cholecalciferol, traZODone, hydrOXYzine, HYDROcodone-acetaminophen, gabapentin, gabapentin, atorvastatin, traMADol, and valsartan.  Meds ordered this encounter  Medications   valsartan (DIOVAN) 160 MG tablet  Sig: Take 1 tablet (160 mg total) by mouth daily. Patient needs an appointment for further refills. 3 rd/final attempt    Dispense:  90 tablet    Refill:  1    Patient needs an appointment for further refills. 3 rd/final attempt

## 2023-06-08 NOTE — Patient Instructions (Signed)
VISIT SUMMARY:  Dear Kristi [name], thank you for coming in for your medication refill and follow-up. We discussed your hypertension, which is well controlled with valsartan, and your history of breast cancer and lymphedema. We also talked about your recent experiences with vertigo, your stroke prevention measures, and your sleep management. We reviewed your general health maintenance and planned for your upcoming screenings and vaccinations.  YOUR PLAN:  -HYPERTENSION: Your blood pressure is well controlled with Valsartan, a medication that helps lower blood pressure. We will continue this medication and have arranged a 90-day refill for [facility] to [facility] at [facility] on [address]  -VERTIGO: You've been experiencing dizziness when lying on your left side, which is likely a condition called benign paroxysmal positional vertigo (BPPV). This is a common condition where certain head movements trigger brief episodes of mild to intense dizziness. For now, we will monitor this and if symptoms worsen or do not improve, we may consider referring you for physical therapy.  -HYPERLIPIDEMIA: Hyperlipidemia is a condition where there are high levels of fats (lipids) in the blood. You are currently on Atorvastatin, a medication that helps lower cholesterol levels. We will continue this medication and have ordered a lipid panel to check your cholesterol levels.  -STROKE PREVENTION: You have a history of stroke and are taking Aspirin 325mg  daily for stroke prevention. Aspirin helps prevent blood clots, which can lead to strokes. We will continue this medication.  -INSOMNIA: You are taking Trazodone for sleep, which you find helpful. Insomnia is a sleep disorder that can make it hard to fall asleep, hard to stay asleep, or cause you to wake up too early and not be able to get back to sleep. We will continue this medication as needed for sleep.  -LYMPHEDEMA: Lymphedema is a condition of localized fluid retention and tissue  swelling caused by a compromised lymphatic system. You developed this condition as a result of your breast cancer treatment. We will not change the current plan and will revisit treatment options if symptoms worsen.  INSTRUCTIONS:  We have ordered labs to check your kidney and liver function, and a diabetes test. We encourage you to get your flu shot and COVID booster in the fall. We also discussed the pneumonia vaccine, which you can get at the pharmacy when you are ready. Please continue with your regular colonoscopies, with the next one due in 2026. We will follow up in six months with Dr. Abner Greenspan.

## 2023-06-08 NOTE — Assessment & Plan Note (Signed)
Reports positional vertigo only when lying on left side. Likely benign paroxysmal positional vertigo (BPPV). -Observe for now. If symptoms worsen or do not improve, consider referral for physical therapy for vestibular rehab.

## 2023-06-08 NOTE — Assessment & Plan Note (Signed)
Reports positional vertigo when lying on left side. Likely benign paroxysmal positional vertigo (BPPV). -Observe for now. If symptoms worsen or do not improve, consider referral for physical therapy for repositioning maneuvers.

## 2023-06-08 NOTE — Assessment & Plan Note (Addendum)
On Atorvastatin. Last cholesterol check was a year ago. -Continue Atorvastatin. -Order lipid panel.

## 2023-07-06 ENCOUNTER — Telehealth: Payer: Self-pay | Admitting: Registered Nurse

## 2023-07-06 MED ORDER — HYDROCODONE-ACETAMINOPHEN 10-325 MG PO TABS: 1.0000 | ORAL_TABLET | Freq: Four times a day (QID) | ORAL | 0 refills | Status: DC | PRN

## 2023-07-06 NOTE — Telephone Encounter (Signed)
PMP was Reviewed.  Hydrocodone e-scribed to pharmacy Ms/ Varns is aware via M-Chart message

## 2023-07-13 ENCOUNTER — Other Ambulatory Visit: Payer: Self-pay | Admitting: Family Medicine

## 2023-07-17 ENCOUNTER — Telehealth: Payer: Self-pay | Admitting: Registered Nurse

## 2023-07-17 MED ORDER — GABAPENTIN 600 MG PO TABS: 600.0000 mg | ORAL_TABLET | Freq: Two times a day (BID) | ORAL | 3 refills | Status: DC

## 2023-07-17 MED ORDER — GABAPENTIN 300 MG PO CAPS: 900.0000 mg | ORAL_CAPSULE | Freq: Every day | ORAL | 3 refills | Status: DC

## 2023-07-17 NOTE — Telephone Encounter (Signed)
PMP was Reviewed. Gabapentin prescriptions e-scribed to pharmacy.  Kristi [name] is aware via My-Chart message.

## 2023-07-24 ENCOUNTER — Encounter: Payer: Medicare Other | Attending: Physical Medicine and Rehabilitation | Admitting: Registered Nurse

## 2023-07-24 ENCOUNTER — Encounter: Payer: Self-pay | Admitting: Registered Nurse

## 2023-07-24 VITALS — BP 119/76 | HR 61 | Ht 66.0 in | Wt 162.0 lb

## 2023-07-24 DIAGNOSIS — Z79891 Long term (current) use of opiate analgesic: Secondary | ICD-10-CM | POA: Diagnosis not present

## 2023-07-24 DIAGNOSIS — Z5181 Encounter for therapeutic drug level monitoring: Secondary | ICD-10-CM | POA: Insufficient documentation

## 2023-07-24 DIAGNOSIS — G894 Chronic pain syndrome: Secondary | ICD-10-CM | POA: Diagnosis not present

## 2023-07-24 MED ORDER — HYDROCODONE-ACETAMINOPHEN 10-325 MG PO TABS: 1.0000 | ORAL_TABLET | Freq: Four times a day (QID) | ORAL | 0 refills | Status: DC | PRN

## 2023-07-24 NOTE — Progress Notes (Signed)
Subjective:    Patient ID: Kristi [name], female    DOB: 07-07-1956, 67 y.o.   MRN: [id number]  HPI: Kristi [name] is a 67 y.o. female who returns for follow up appointment for chronic pain and medication refill. She states her pain is located in her left hand and left arm with tingling and bilateral feet with tingling and burning. She. rates her pain 5. Her current exercise regime is walking and performing stretching exercises.  Ms. Scoby Morphine equivalent is 40.00 MME.   UDS ordered today      Pain Inventory Average Pain 5 Pain Right Now 5 My pain is intermittent, constant, sharp, burning, stabbing, and tingling  In the last 24 hours, has pain interfered with the following? General activity 3 Relation with others 2 Enjoyment of life 3 What TIME of day is your pain at its worst? morning , daytime, and evening Sleep (in general) Good  Pain is worse with: walking, sitting, inactivity, and standing Pain improves with: therapy/exercise and medication Relief from Meds: 5  Family History  Problem Relation Age of Onset   Lung cancer Father    Hypertension Father    Thyroid cancer Father        dx in his 69s   Cancer Father        lung, thyroid, smoker   Breast cancer Paternal Aunt 39   Colon cancer Paternal Aunt        dx in her 50x   Cervical cancer Paternal Aunt        dzx in her 80s   Ovarian cancer Cousin        dx in her lage 77s   Breast cancer Cousin        maternal first cousin, once removed; dx in her late 80s   Breast cancer Cousin        maternal first cousin once removed; dx in late 69s   Hypertension Mother    Diabetes Mother    Dementia Mother    Memory loss Mother    Hypertension Brother    Seizures Brother        Alcohol induced.   Alcohol abuse Brother        drinker, smoker   COPD Brother    Cancer Paternal Uncle        oral cancer   Kidney cancer Paternal Grandmother    Arthritis Daughter        back surgery   Cancer Cousin         several paternal cousins with brain cancer, leukemia, and other cancers   Cancer Sister        stomach   Social History   Socioeconomic History   Marital status: Married    Spouse name: [name]   Number of children: 1   Years of education: 12   Highest education level: Not on file  Occupational History   Occupation: INVENTORY Office manager: [employer]   Occupation: disability no longer working 2015  Tobacco Use   Smoking status: Never   Smokeless tobacco: Never  Vaping Use   Vaping status: Never Used  Substance and Sexual Activity   Alcohol use: No   Drug use: No   Sexual activity: Yes    Comment: lives with husband, disability/retirement. [employer], no dietary restrictions  Other Topics Concern   Not on file  Social History Narrative   Patient is married [name]) and lives at home  with her husband.   Patient has one daughter   Patient drinks very little caffeine.   Left handed   Social Determinants of Health   Financial Resource Strain: Low Risk  (07/24/2022)   Overall Financial Resource Strain (CARDIA)    Difficulty of Paying Living Expenses: Not hard at all  Food Insecurity: No Food Insecurity (07/24/2022)   Hunger Vital Sign    Worried About Running Out of Food in the Last Year: Never true    Ran Out of Food in the Last Year: Never true  Transportation Needs: No Transportation Needs (07/24/2022)   PRAPARE - Administrator, Civil Service (Medical): No    Lack of Transportation (Non-Medical): No  Physical Activity: Sufficiently Active (07/24/2022)   Exercise Vital Sign    Days of Exercise per Week: 6 days    Minutes of Exercise per Session: 140 min  Stress: No Stress Concern Present (07/24/2022)   [employer] of Occupational Health - Occupational Stress Questionnaire    Feeling of Stress : Not at all  Social Connections: Socially Integrated (07/24/2022)   Social Connection and Isolation Panel [NHANES]    Frequency of  Communication with Friends and Family: More than three times a week    Frequency of Social Gatherings with Friends and Family: Once a week    Attends Religious Services: 1 to 4 times per year    Active Member of Clubs or Organizations: Yes    Attends Engineer, structural: More than 4 times per year    Marital Status: Married   Past Surgical History:  Procedure Laterality Date   ANAL SPHINCTEROTOMY  04/2011   APPENDECTOMY  1980   AXILLARY LYMPH NODE DISSECTION Left 02/04/2013   Procedure: LEFT AXILLARY LYMPH NODE DISSECTION;  Surgeon: Almond Lint, MD;  Location: [facility];  Service: General;  Laterality: Left;  End: 1512   BREAST LUMPECTOMY WITH NEEDLE LOCALIZATION Left 02/04/2013   Procedure: LEFT BREAST LUMPECTOMY WITH NEEDLE LOCALIZATION;  Surgeon: Almond Lint, MD;  Location: [facility];  Service: General;  Laterality: Left;   BREAST SURGERY     Lumpectomy in april 2014   HEMORRHOID SURGERY  04/2011   ligation   MASTECTOMY Left 02/15/2017   PORT-A-CATH REMOVAL N/A 04/16/2014   Procedure: REMOVAL PORT-A-CATH;  Surgeon: Almond Lint, MD;  Location: [facility];  Service: General;  Laterality: N/A;   PORTACATH PLACEMENT Right 02/04/2013   Procedure: INSERTION PORT-A-CATH;  Surgeon: Almond Lint, MD;  Location: [facility];  Service: General;  Laterality: Right;  Start Time: 6578.   SHOULDER ARTHROSCOPY WITH ROTATOR CUFF REPAIR AND SUBACROMIAL DECOMPRESSION Left 02/24/2014   Procedure: SHOULDER ARTHROSCOPY WITH ROTATOR CUFF REPAIR AND SUBACROMIAL DECOMPRESSION;  Surgeon: Cammy Copa, MD;  Location: [facility] OR;  Service: Orthopedics;  Laterality: Left;  LEFT SHOULDER DIAGNOSTIC OPERATIVE ARTHROSCOPY, SUBACROMIAL DECOMPRESSION, ROTATOR CUFF TEAR REPAIR   SIMPLE MASTECTOMY WITH AXILLARY SENTINEL NODE BIOPSY Left 02/15/2017   Procedure: LEFT MASTECTOMY;  Surgeon: Almond Lint, MD;  Location: [facility];  Service: General;  Laterality: Left;   TOTAL MASTECTOMY Right 12/26/2018   Procedure: RIGHT BREAST PROPHYLATIC  MASTECTOMY;  Surgeon: Almond Lint, MD;  Location: [facility];  Service: General;  Laterality: Right;   Past Surgical History:  Procedure Laterality Date   ANAL SPHINCTEROTOMY  04/2011   APPENDECTOMY  1980   AXILLARY LYMPH NODE DISSECTION Left 02/04/2013   Procedure: LEFT AXILLARY LYMPH NODE DISSECTION;  Surgeon: Almond Lint, MD;  Location: [facility];  Service: General;  Laterality:  Left;  End: 1512   BREAST LUMPECTOMY WITH NEEDLE LOCALIZATION Left 02/04/2013   Procedure: LEFT BREAST LUMPECTOMY WITH NEEDLE LOCALIZATION;  Surgeon: Almond Lint, MD;  Location: [facility];  Service: General;  Laterality: Left;   BREAST SURGERY     Lumpectomy in april 2014   HEMORRHOID SURGERY  04/2011   ligation   MASTECTOMY Left 02/15/2017   PORT-A-CATH REMOVAL N/A 04/16/2014   Procedure: REMOVAL PORT-A-CATH;  Surgeon: Almond Lint, MD;  Location: [facility];  Service: General;  Laterality: N/A;   PORTACATH PLACEMENT Right 02/04/2013   Procedure: INSERTION PORT-A-CATH;  Surgeon: Almond Lint, MD;  Location: [facility];  Service: General;  Laterality: Right;  Start Time: 3086.   SHOULDER ARTHROSCOPY WITH ROTATOR CUFF REPAIR AND SUBACROMIAL DECOMPRESSION Left 02/24/2014   Procedure: SHOULDER ARTHROSCOPY WITH ROTATOR CUFF REPAIR AND SUBACROMIAL DECOMPRESSION;  Surgeon: Cammy Copa, MD;  Location: [facility] OR;  Service: Orthopedics;  Laterality: Left;  LEFT SHOULDER DIAGNOSTIC OPERATIVE ARTHROSCOPY, SUBACROMIAL DECOMPRESSION, ROTATOR CUFF TEAR REPAIR   SIMPLE MASTECTOMY WITH AXILLARY SENTINEL NODE BIOPSY Left 02/15/2017   Procedure: LEFT MASTECTOMY;  Surgeon: Almond Lint, MD;  Location: [facility];  Service: General;  Laterality: Left;   TOTAL MASTECTOMY Right 12/26/2018   Procedure: RIGHT BREAST PROPHYLATIC MASTECTOMY;  Surgeon: Almond Lint, MD;  Location: [facility];  Service: General;  Laterality: Right;   Past Medical History:  Diagnosis Date   Abnormal gait 12/17/2019   Acute blood loss anemia    Anal fissure 05/03/2011   Anal pain 04/27/2021    Anemia    Iron deficinecy anemia   Anemia 05/14/2017   Anxiety and depression 05/15/2014   Arthritis    Body mass index (BMI) 30.0-30.9, adult 12/17/2019   Breast cancer (HCC)    left ,last radiation 2'15, last chemo 8'14   Cervical cancer screening 06/18/2018   Menarche at 11 Regular and moderate flow  history of abnormal pap in past, 1 mild abnormality years ago that resolved spontaneously on recheck G1P1, s/p 1 svd history of abnormal MGM, b/o breast cancer 2014 No concerns today  gyn surgeries. Lumpectomy  LMP around early 2014   Cervical stenosis of spine 12/17/2019   Constipation 11/13/2016   Contracture of axilla 05/20/2013   Debility 01/18/2020   Essential (primary) hypertension 12/17/2019   Flushing 02/20/2014   Gait instability 12/17/2019   Genetic testing 02/20/2019   Negative genetic testing on the Comprehensive Cancer Panel.  The Comprehensive Common Cancer Panel offered by [facility] includes sequencing and/or deletion duplication testing of the following 46 genes: APC, ATM, AXIN2, BAP1, BARD1, BMPR1A, BRCA1, BRCA2, BRIP1, CDH1, CDK4, CDKN2A, CHEK2, EPCAM, FANCC, FH, FLCN, HOXB13, MET, MITF,  MLH1, MSH2, MSH6, MUTYH, NBN, NF1, NTHL1,  PALB2, PMS2, POLD1, POLE, P   H/O: CVA (cerebrovascular accident) 06/19/2020   Hereditary and idiopathic peripheral neuropathy 11/11/2013   Herpes zoster 05/15/2014   History of chicken pox    History of left breast cancer 02/15/2017   History of radiation therapy 09/09/13-10/28/13   45 gray to left breast, lumpectomy cavity boosted to 63 gray   HTN (hypertension) 11/13/2016   Hyperglycemia 01/09/2016   Hyperlipidemia 05/14/2017   Hypertension    Internal hemorrhoids without complication 05/03/2011   Knee pain, bilateral 11/14/2016   Left upper extremity numbness 06/19/2018   Major depressive disorder, recurrent episode, severe (HCC) 01/12/2014   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 Overview:   Left IDC, ER 3%, PR-, Her2neu-  Last Assessment & Plan:  Pt will continue to take her tamoxifen as previously directed. She appears to be tolerating this with only c/o moderate, occ hot flashes.   Mild neurocognitive disorder due to multiple etiologies 03/17/2020   MRSA (methicillin resistant Staphylococcus aureus) 2009   right groin area-no issues now. 04-07-14 PCR screen negative today.   Neuropathy    Neuropathy of hand, left 09/21/2015   Nodule of finger of left hand 11/14/2016   Palpitations 07/30/2020   Personal history of colonic polyps 04/27/2021   Preventative health care 11/13/2016   Right hip pain 11/14/2016   S/P mastectomy, right 12/26/2018   Sebaceous cyst 04/12/2013   Shoulder joint pain 12/12/2013   Spinal stenosis in cervical region 10/09/2019   Strain of rotator cuff 02/24/2014   Stroke (HCC) 06/2020   Vertigo    LMP 01/20/2013   Opioid Risk Score:   Fall Risk Score:  `1  Depression screen PHQ 2/9     06/08/2023    1:44 PM 03/29/2023    1:17 PM 02/15/2023   12:46 PM 01/04/2023    2:38 PM 11/30/2022    2:24 PM 09/14/2022    1:44 PM 08/03/2022    1:19 PM  Depression screen PHQ 2/9  Decreased Interest 0 0 0 1 0 0 0  Down, Depressed, Hopeless 0 0 0 1 0 0 0  PHQ - 2 Score 0 0 0 2 0 0 0  Altered sleeping 0        Tired, decreased energy 0        Change in appetite 0        Feeling bad or failure about yourself  0        Trouble concentrating 0        Moving slowly or fidgety/restless 0        Suicidal thoughts 0        PHQ-9 Score 0        Difficult doing work/chores Not difficult at all          Review of Systems  Musculoskeletal:        Pain in both hands & feet  All other systems reviewed and are negative.      Objective:   Physical Exam Vitals and nursing note reviewed.  Constitutional:      Appearance: Normal appearance.  Cardiovascular:     Rate and Rhythm:  Normal rate and regular rhythm.     Pulses: Normal pulses.     Heart sounds: Normal heart sounds.  Pulmonary:     Effort: Pulmonary effort is normal.     Breath sounds: Normal breath sounds.  Musculoskeletal:     Cervical back: Normal range of motion and neck supple.     Comments: Normal Muscle Bulk and Muscle Testing Reveals:  Upper Extremities: Full ROM and Muscle Strength 5/5  Lower Extremities: Full ROM and Muscle Strength 5/5 Arises from Table with ease Narrow Based  Gait     Skin:    General: Skin is warm and dry.  Neurological:     Mental Status: She is alert and oriented to person, place, and time.  Psychiatric:        Mood and Affect: Mood normal.        Behavior: Behavior normal.         Assessment & Plan:  1. Chemotherapy Induced Peripheral Neuropathy: Continue current medication  regimen with Gabapentin. Continue to Monitor. 07/24/2023 2. Chronic Pain Syndrome: Refilled: Tramadol 50 mg one tablet twice a day for breakthrough pain #60 and  Continue Hydrocodone 10/325 mg one tablet4 times a day as needed for pain #120. We will continue the opioid monitoring program, this consists of regular clinic visits, examinations, urine drug screen, pill counts as well as use of [place] Controlled Substance Reporting system. A 12 month History has been reviewed on the [place] Controlled Substance Reporting System on 07/24/2023 3. Polyarthralgia: Continue HEP as Tolerated: Continue to Monitor. 07/24/2023   F/U in 2 months

## 2023-07-26 ENCOUNTER — Ambulatory Visit: Payer: Medicare Other | Admitting: Registered Nurse

## 2023-07-29 LAB — TOXASSURE SELECT,+ANTIDEPR,UR

## 2023-08-02 ENCOUNTER — Ambulatory Visit: Payer: Medicare Other | Admitting: *Deleted

## 2023-08-02 DIAGNOSIS — Z0000 Encounter for general adult medical examination without abnormal findings: Secondary | ICD-10-CM | POA: Diagnosis not present

## 2023-08-02 NOTE — Patient Instructions (Signed)
Kristi [name] , Thank you for taking time to come for your Medicare Wellness Visit. I appreciate your ongoing commitment to your health goals. Please review the following plan we discussed and let me know if I can assist you in the future.     This is a list of the screening recommended for you and due dates:  Health Maintenance  Topic Date Due   Hepatitis C Screening  Never done   DTaP/Tdap/Td vaccine (1 - Tdap) Never done   Zoster (Shingles) Vaccine (1 of 2) Never done   Pneumonia Vaccine (1 of 1 - PCV) Never done   COVID-19 Vaccine (6 - 2023-24 season) 07/08/2023   Flu Shot  02/04/2024*   Medicare Annual Wellness Visit  08/01/2024   Colon Cancer Screening  02/25/2025   DEXA scan (bone density measurement)  Completed   HPV Vaccine  Aged Out   Mammogram  Discontinued  *Topic was postponed. The date shown is not the original due date.    Next appointment: Follow up in one year for your annual wellness visit.   Preventive Care 67 Years and Older, Female Preventive care refers to lifestyle choices and visits with your health care provider that can promote health and wellness. What does preventive care include? A yearly physical exam. This is also called an annual well check. Dental exams once or twice a year. Routine eye exams. Ask your health care provider how often you should have your eyes checked. Personal lifestyle choices, including: Daily care of your teeth and gums. Regular physical activity. Eating a healthy diet. Avoiding tobacco and drug use. Limiting alcohol use. Practicing safe sex. Taking low-dose aspirin every day. Taking vitamin and mineral supplements as recommended by your health care provider. What happens during an annual well check? The services and screenings done by your health care provider during your annual well check will depend on your age, overall health, lifestyle risk factors, and family history of disease. Counseling  Your health care provider  may ask you questions about your: Alcohol use. Tobacco use. Drug use. Emotional well-being. Home and relationship well-being. Sexual activity. Eating habits. History of falls. Memory and ability to understand (cognition). Work and work Astronomer. Reproductive health. Screening  You may have the following tests or measurements: Height, weight, and BMI. Blood pressure. Lipid and cholesterol levels. These may be checked every 5 years, or more frequently if you are over 72 years old. Skin check. Lung cancer screening. You may have this screening every year starting at age 6 if you have a 30-pack-year history of smoking and currently smoke or have quit within the past 15 years. Fecal occult blood test (FOBT) of the stool. You may have this test every year starting at age 33. Flexible sigmoidoscopy or colonoscopy. You may have a sigmoidoscopy every 5 years or a colonoscopy every 10 years starting at age 58. Hepatitis C blood test. Hepatitis B blood test. Sexually transmitted disease (STD) testing. Diabetes screening. This is done by checking your blood sugar (glucose) after you have not eaten for a while (fasting). You may have this done every 1-3 years. Bone density scan. This is done to screen for osteoporosis. You may have this done starting at age 90. Mammogram. This may be done every 1-2 years. Talk to your health care provider about how often you should have regular mammograms. Talk with your health care provider about your test results, treatment options, and if necessary, the need for more tests. Vaccines  Your health care provider  may recommend certain vaccines, such as: Influenza vaccine. This is recommended every year. Tetanus, diphtheria, and acellular pertussis (Tdap, Td) vaccine. You may need a Td booster every 10 years. Zoster vaccine. You may need this after age 73. Pneumococcal 13-valent conjugate (PCV13) vaccine. One dose is recommended after age 23. Pneumococcal  polysaccharide (PPSV23) vaccine. One dose is recommended after age 22. Talk to your health care provider about which screenings and vaccines you need and how often you need them. This information is not intended to replace advice given to you by your health care provider. Make sure you discuss any questions you have with your health care provider. Document Released: 11/19/2015 Document Revised: 07/12/2016 Document Reviewed: 08/24/2015 Elsevier Interactive Patient Education  2017 [employer].  Fall Prevention in the Home Falls can cause injuries. They can happen to people of all ages. There are many things you can do to make your home safe and to help prevent falls. What can I do on the outside of my home? Regularly fix the edges of walkways and driveways and fix any cracks. Remove anything that might make you trip as you walk through a door, such as a raised step or threshold. Trim any bushes or trees on the path to your home. Use bright outdoor lighting. Clear any walking paths of anything that might make someone trip, such as rocks or tools. Regularly check to see if handrails are loose or broken. Make sure that both sides of any steps have handrails. Any raised decks and porches should have guardrails on the edges. Have any leaves, snow, or ice cleared regularly. Use sand or salt on walking paths during winter. Clean up any spills in your garage right away. This includes oil or grease spills. What can I do in the bathroom? Use night lights. Install grab bars by the toilet and in the tub and shower. Do not use towel bars as grab bars. Use non-skid mats or decals in the tub or shower. If you need to sit down in the shower, use a plastic, non-slip stool. Keep the floor dry. Clean up any water that spills on the floor as soon as it happens. Remove soap buildup in the tub or shower regularly. Attach bath mats securely with double-sided non-slip rug tape. Do not have throw rugs and other  things on the floor that can make you trip. What can I do in the bedroom? Use night lights. Make sure that you have a light by your bed that is easy to reach. Do not use any sheets or blankets that are too big for your bed. They should not hang down onto the floor. Have a firm chair that has side arms. You can use this for support while you get dressed. Do not have throw rugs and other things on the floor that can make you trip. What can I do in the kitchen? Clean up any spills right away. Avoid walking on wet floors. Keep items that you use a lot in easy-to-reach places. If you need to reach something above you, use a strong step stool that has a grab bar. Keep electrical cords out of the way. Do not use floor polish or wax that makes floors slippery. If you must use wax, use non-skid floor wax. Do not have throw rugs and other things on the floor that can make you trip. What can I do with my stairs? Do not leave any items on the stairs. Make sure that there are handrails on both sides  of the stairs and use them. Fix handrails that are broken or loose. Make sure that handrails are as long as the stairways. Check any carpeting to make sure that it is firmly attached to the stairs. Fix any carpet that is loose or worn. Avoid having throw rugs at the top or bottom of the stairs. If you do have throw rugs, attach them to the floor with carpet tape. Make sure that you have a light switch at the top of the stairs and the bottom of the stairs. If you do not have them, ask someone to add them for you. What else can I do to help prevent falls? Wear shoes that: Do not have high heels. Have rubber bottoms. Are comfortable and fit you well. Are closed at the toe. Do not wear sandals. If you use a stepladder: Make sure that it is fully opened. Do not climb a closed stepladder. Make sure that both sides of the stepladder are locked into place. Ask someone to hold it for you, if possible. Clearly  mark and make sure that you can see: Any grab bars or handrails. First and last steps. Where the edge of each step is. Use tools that help you move around (mobility aids) if they are needed. These include: Canes. Walkers. Scooters. Crutches. Turn on the lights when you go into a dark area. Replace any light bulbs as soon as they burn out. Set up your furniture so you have a clear path. Avoid moving your furniture around. If any of your floors are uneven, fix them. If there are any pets around you, be aware of where they are. Review your medicines with your doctor. Some medicines can make you feel dizzy. This can increase your chance of falling. Ask your doctor what other things that you can do to help prevent falls. This information is not intended to replace advice given to you by your health care provider. Make sure you discuss any questions you have with your health care provider. Document Released: 08/19/2009 Document Revised: 03/30/2016 Document Reviewed: 11/27/2014 Elsevier Interactive Patient Education  2017 [employer].

## 2023-08-02 NOTE — Progress Notes (Signed)
Subjective:   Kristi [name] is a 67 y.o. female who presents for Medicare Annual (Subsequent) preventive examination.  Visit Complete: Virtual  I connected with  [name] on 08/02/23 by a audio enabled telemedicine application and verified that I am speaking with the correct person using two identifiers.  Patient Location: Home  Provider Location: Office/Clinic  I discussed the limitations of evaluation and management by telemedicine. The patient expressed understanding and agreed to proceed.   Cardiac Risk Factors include: advanced age (>53men, >39 women);dyslipidemia;hypertension     Objective:    Because this visit was a virtual/telehealth visit, some criteria may be missing or patient reported. Any vitals not documented were not able to be obtained and vitals that have been documented are patient reported.       08/02/2023    2:27 PM 07/24/2022    1:34 PM 05/11/2022    2:11 PM 02/23/2022   11:35 AM 01/26/2022   11:32 AM 06/01/2021    2:24 PM 05/24/2021   11:13 AM  Advanced Directives  Does Patient Have a Medical Advance Directive? No No No No No No No  Would patient like information on creating a medical advance directive? No - Patient declined     No - Patient declined No - Patient declined    Current Medications (verified) Outpatient Encounter Medications as of 08/02/2023  Medication Sig   anastrozole (ARIMIDEX) 1 MG tablet TAKE 1 TABLET BY MOUTH DAILY IN THE MORNING   Apoaequorin (PREVAGEN) 10 MG CAPS Take 1 capsule by mouth daily.   ARIPiprazole (ABILIFY) 5 MG tablet Take 5 mg by mouth at bedtime.   aspirin EC 325 MG tablet Take 1 tablet (325 mg total) by mouth daily.   atorvastatin (LIPITOR) 40 MG tablet TAKE 1 TABLET BY MOUTH EVERY DAY   Cholecalciferol 50 MCG (2000 UT) CAPS as directed Orally   gabapentin (NEURONTIN) 300 MG capsule Take 3 capsules (900 mg total) by mouth at bedtime.   gabapentin (NEURONTIN) 600 MG tablet Take 1 tablet (600 mg total) by mouth  2 (two) times daily.   HYDROcodone-acetaminophen (NORCO) 10-325 MG tablet Take 1 tablet by mouth 4 (four) times daily as needed.   hydrOXYzine (ATARAX) 50 MG tablet 1 ml as needed Orally twice daily   Multiple Vitamin (MULTIVITAMIN WITH MINERALS) TABS tablet Take 1 tablet by mouth daily.   traMADol (ULTRAM) 50 MG tablet Take 1 tablet (50 mg total) by mouth 2 (two) times daily as needed. Break through Pain   traZODone (DESYREL) 50 MG tablet 1 tablet at bedtime as needed Orally Once a day for 30 day(s)   valsartan (DIOVAN) 160 MG tablet Take 1 tablet (160 mg total) by mouth daily. Patient needs an appointment for further refills. 3 rd/final attempt   No facility-administered encounter medications on file as of 08/02/2023.    Allergies (verified) Patient has no known allergies.   History: Past Medical History:  Diagnosis Date   Abnormal gait 12/17/2019   Acute blood loss anemia    Anal fissure 05/03/2011   Anal pain 04/27/2021   Anemia    Iron deficinecy anemia   Anemia 05/14/2017   Anxiety and depression 05/15/2014   Arthritis    Body mass index (BMI) 30.0-30.9, adult 12/17/2019   Breast cancer (HCC)    left ,last radiation 2'15, last chemo 8'14   Cervical cancer screening 06/18/2018   Menarche at 11 Regular and moderate flow  history of abnormal pap in past, 1 mild abnormality  years ago that resolved spontaneously on recheck G1P1, s/p 1 svd history of abnormal MGM, b/o breast cancer 2014 No concerns today  gyn surgeries. Lumpectomy  LMP around early 2014   Cervical stenosis of spine 12/17/2019   Constipation 11/13/2016   Contracture of axilla 05/20/2013   Debility 01/18/2020   Essential (primary) hypertension 12/17/2019   Flushing 02/20/2014   Gait instability 12/17/2019   Genetic testing 02/20/2019   Negative genetic testing on the Comprehensive Cancer Panel.  The Comprehensive Common Cancer Panel offered by [facility] includes sequencing and/or deletion duplication testing of the  following 46 genes: APC, ATM, AXIN2, BAP1, BARD1, BMPR1A, BRCA1, BRCA2, BRIP1, CDH1, CDK4, CDKN2A, CHEK2, EPCAM, FANCC, FH, FLCN, HOXB13, MET, MITF,  MLH1, MSH2, MSH6, MUTYH, NBN, NF1, NTHL1,  PALB2, PMS2, POLD1, POLE, P   H/O: CVA (cerebrovascular accident) 06/19/2020   Hereditary and idiopathic peripheral neuropathy 11/11/2013   Herpes zoster 05/15/2014   History of chicken pox    History of left breast cancer 02/15/2017   History of radiation therapy 09/09/13-10/28/13   45 gray to left breast, lumpectomy cavity boosted to 63 gray   HTN (hypertension) 11/13/2016   Hyperglycemia 01/09/2016   Hyperlipidemia 05/14/2017   Hypertension    Internal hemorrhoids without complication 05/03/2011   Knee pain, bilateral 11/14/2016   Left upper extremity numbness 06/19/2018   Major depressive disorder, recurrent episode, severe (HCC) 01/12/2014   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 Overview:   Left IDC, ER 3%, PR-, Her2neu-  Last Assessment & Plan:  Pt will continue to take her tamoxifen as previously directed. She appears to be tolerating this with only c/o moderate, occ hot flashes.   Mild neurocognitive disorder due to multiple etiologies 03/17/2020   MRSA (methicillin resistant Staphylococcus aureus) 2009   right groin area-no issues now. 04-07-14 PCR screen negative today.   Neuropathy    Neuropathy of hand, left 09/21/2015   Nodule of finger of left hand 11/14/2016   Palpitations 07/30/2020   Personal history of colonic polyps 04/27/2021   Preventative health care 11/13/2016   Right hip pain 11/14/2016   S/P mastectomy, right 12/26/2018   Sebaceous cyst 04/12/2013   Shoulder joint pain 12/12/2013   Spinal stenosis in cervical region 10/09/2019   Strain of rotator cuff 02/24/2014   Stroke (HCC) 06/2020   Vertigo    Past Surgical History:  Procedure  Laterality Date   ANAL SPHINCTEROTOMY  04/2011   APPENDECTOMY  1980   AXILLARY LYMPH NODE DISSECTION Left 02/04/2013   Procedure: LEFT AXILLARY LYMPH NODE DISSECTION;  Surgeon: Almond Lint, MD;  Location: [facility] OR;  Service: General;  Laterality: Left;  End: 1512   BREAST LUMPECTOMY WITH NEEDLE LOCALIZATION Left 02/04/2013   Procedure: LEFT BREAST LUMPECTOMY WITH NEEDLE LOCALIZATION;  Surgeon: Almond Lint, MD;  Location: [facility] OR;  Service: General;  Laterality: Left;   BREAST SURGERY     Lumpectomy in april 2014   HEMORRHOID SURGERY  04/2011   ligation   MASTECTOMY Left 02/15/2017   PORT-A-CATH REMOVAL N/A 04/16/2014   Procedure: REMOVAL PORT-A-CATH;  Surgeon: Almond Lint, MD;  Location: [facility];  Service: General;  Laterality: N/A;   PORTACATH PLACEMENT Right 02/04/2013   Procedure: INSERTION PORT-A-CATH;  Surgeon: Almond Lint, MD;  Location: [facility] OR;  Service: General;  Laterality: Right;  Start Time: 2956.   SHOULDER ARTHROSCOPY WITH ROTATOR  CUFF REPAIR AND SUBACROMIAL DECOMPRESSION Left 02/24/2014   Procedure: SHOULDER ARTHROSCOPY WITH ROTATOR CUFF REPAIR AND SUBACROMIAL DECOMPRESSION;  Surgeon: Cammy Copa, MD;  Location: [facility] OR;  Service: Orthopedics;  Laterality: Left;  LEFT SHOULDER DIAGNOSTIC OPERATIVE ARTHROSCOPY, SUBACROMIAL DECOMPRESSION, ROTATOR CUFF TEAR REPAIR   SIMPLE MASTECTOMY WITH AXILLARY SENTINEL NODE BIOPSY Left 02/15/2017   Procedure: LEFT MASTECTOMY;  Surgeon: Almond Lint, MD;  Location: [facility] OR;  Service: General;  Laterality: Left;   TOTAL MASTECTOMY Right 12/26/2018   Procedure: RIGHT BREAST PROPHYLATIC MASTECTOMY;  Surgeon: Almond Lint, MD;  Location: [facility] OR;  Service: General;  Laterality: Right;   Family History  Problem Relation Age of Onset   Lung cancer Father    Hypertension Father    Thyroid cancer Father        dx in his 53s   Cancer Father        lung, thyroid, smoker   Breast cancer Paternal Aunt 30   Colon cancer Paternal Aunt        dx in her 50x    Cervical cancer Paternal Aunt        dzx in her 32s   Ovarian cancer Cousin        dx in her lage 26s   Breast cancer Cousin        maternal first cousin, once removed; dx in her late 27s   Breast cancer Cousin        maternal first cousin once removed; dx in late 63s   Hypertension Mother    Diabetes Mother    Dementia Mother    Memory loss Mother    Hypertension Brother    Seizures Brother        Alcohol induced.   Alcohol abuse Brother        drinker, smoker   COPD Brother    Cancer Paternal Uncle        oral cancer   Kidney cancer Paternal Grandmother    Arthritis Daughter        back surgery   Cancer Cousin        several paternal cousins with brain cancer, leukemia, and other cancers   Cancer Sister        stomach   Social History   Socioeconomic History   Marital status: Married    Spouse name: [name]   Number of children: 1   Years of education: 12   Highest education level: Not on file  Occupational History   Occupation: INVENTORY Office manager: [employer]   Occupation: disability no longer working 2015  Tobacco Use   Smoking status: Never   Smokeless tobacco: Never  Vaping Use   Vaping status: Never Used  Substance and Sexual Activity   Alcohol use: No   Drug use: No   Sexual activity: Yes    Comment: lives with husband, disability/retirement. [employer], no dietary restrictions  Other Topics Concern   Not on file  Social History Narrative   Patient is married [name]) and lives at home with her husband.   Patient has one daughter   Patient drinks very little caffeine.   Left handed   Social Determinants of Health   Financial Resource Strain: Low Risk  (08/02/2023)   Overall Financial Resource Strain (CARDIA)    Difficulty of Paying Living Expenses: Not hard at all  Food Insecurity: No Food Insecurity (08/02/2023)   Hunger Vital Sign    Worried About Running Out  of Food in the Last Year: Never true    Ran Out of  Food in the Last Year: Never true  Transportation Needs: No Transportation Needs (08/02/2023)   PRAPARE - Administrator, Civil Service (Medical): No    Lack of Transportation (Non-Medical): No  Physical Activity: Sufficiently Active (08/02/2023)   Exercise Vital Sign    Days of Exercise per Week: 7 days    Minutes of Exercise per Session: 120 min  Stress: No Stress Concern Present (08/02/2023)   [employer] of Occupational Health - Occupational Stress Questionnaire    Feeling of Stress : Not at all  Social Connections: Moderately Integrated (08/02/2023)   Social Connection and Isolation Panel [NHANES]    Frequency of Communication with Friends and Family: More than three times a week    Frequency of Social Gatherings with Friends and Family: Twice a week    Attends Religious Services: More than 4 times per year    Active Member of [employer] or Organizations: No    Attends Engineer, structural: Never    Marital Status: Married    Tobacco Counseling Counseling given: Not Answered   Clinical Intake:  Pre-visit preparation completed: Yes  Pain : 0-10 Pain Score: 4  Pain Type: Neuropathic pain Pain Location: Foot  Nutritional Risks: None Diabetes: No  How often do you need to have someone help you when you read instructions, pamphlets, or other written materials from your doctor or pharmacy?: 1 - Never  Interpreter Needed?: No  Information entered by :: [employer], CMA   Activities of Daily Living    08/02/2023    2:21 PM  In your present state of health, do you have any difficulty performing the following activities:  Hearing? 0  Vision? 0  Difficulty concentrating or making decisions? 0  Walking or climbing stairs? 0  Dressing or bathing? 0  Doing errands, shopping? 0  Preparing Food and eating ? N  Using the Toilet? N  In the past six months, have you accidently leaked urine? N  Do you have problems with loss of bowel control? N  Managing  your Medications? N  Managing your Finances? N  Housekeeping or managing your Housekeeping? N    Patient Care Team: Bradd Canary, MD as PCP - General (Family Medicine) Serena Croissant, MD as Consulting Physician (Hematology and Oncology) Leilani Able, MD as Consulting Physician (Family Medicine) Merwyn Katos, DPM as Consulting Physician (Podiatry) Antony Blackbird, MD as Consulting Physician (Radiation Oncology) Albertina Parr as Consulting Physician (Nurse Practitioner)  Indicate any recent Medical Services you may have received from other than [facility] providers in the past year (date may be approximate).     Assessment:   This is a routine wellness examination for Carmesha.  Hearing/Vision screen No results found.   Goals Addressed   None    Depression Screen    08/02/2023    2:25 PM 07/24/2023   12:51 PM 06/08/2023    1:44 PM 03/29/2023    1:17 PM 02/15/2023   12:46 PM 01/04/2023    2:38 PM 11/30/2022    2:24 PM  PHQ 2/9 Scores  PHQ - 2 Score 0 0 0 0 0 2 0  PHQ- 9 Score   0        Fall Risk    08/02/2023    2:23 PM 07/24/2023   12:50 PM 06/08/2023    1:44 PM 03/29/2023    1:17 PM 02/15/2023   12:46 PM  Fall Risk   Falls in the past year? 1 0 0 0 0  Comment slipped on the curb      Number falls in past yr: 0 0 0 0   Injury with Fall? 0 0 0    Risk for fall due to : No Fall Risks  No Fall Risks    Follow up Falls evaluation completed  Falls evaluation completed      MEDICARE RISK AT HOME: Medicare Risk at Home Any stairs in or around the home?: Yes If so, are there any without handrails?: No Home free of loose throw rugs in walkways, pet beds, electrical cords, etc?: Yes Adequate lighting in your home to reduce risk of falls?: Yes Life alert?: No Use of a cane, walker or w/c?: No Grab bars in the bathroom?: Yes Shower chair or bench in shower?: Yes (built in seat) Elevated toilet seat or a handicapped toilet?: No  TIMED UP AND GO:  Was the test performed?  No     Cognitive Function:    12/08/2019    9:43 AM 04/30/2018   11:02 AM  MMSE - Mini Mental State Exam  Orientation to time 5 5  Orientation to Place 5 5  Registration 3 3  Attention/ Calculation 0 5  Recall 3 1  Language- name 2 objects 2 2  Language- repeat 1 1  Language- follow 3 step command 2 3  Language- read & follow direction 1 1  Write a sentence 1 1  Copy design 1 1  Total score 24 28        08/02/2023    2:30 PM 07/24/2022    1:41 PM  6CIT Screen  What Year? 0 points 0 points  What month? 0 points 0 points  What time? 0 points 0 points  Count back from 20 0 points 0 points  Months in reverse 0 points 0 points  Repeat phrase 0 points 0 points  Total Score 0 points 0 points    Immunizations Immunization History  Administered Date(s) Administered   Fluad Quad(high Dose 65+) 07/13/2022   Influenza Inj Mdck Quad Pf 07/29/2018   Influenza-Unspecified 07/15/2019, 08/03/2021   PFIZER(Purple Top)SARS-COV-2 Vaccination 01/30/2020, 02/24/2020, 07/09/2020, 11/25/2020   Pfizer Covid-19 Vaccine Bivalent Booster 21yrs & up 07/13/2022    TDAP status: Due, Education has been provided regarding the importance of this vaccine. Advised may receive this vaccine at local pharmacy or Health Dept. Aware to provide a copy of the vaccination record if obtained from local pharmacy or Health Dept. Verbalized acceptance and understanding.  Flu Vaccine status: Due, Education has been provided regarding the importance of this vaccine. Advised may receive this vaccine at local pharmacy or Health Dept. Aware to provide a copy of the vaccination record if obtained from local pharmacy or Health Dept. Verbalized acceptance and understanding.  Pneumococcal vaccine status: Due, Education has been provided regarding the importance of this vaccine. Advised may receive this vaccine at local pharmacy or Health Dept. Aware to provide a copy of the vaccination record if obtained from local pharmacy or  Health Dept. Verbalized acceptance and understanding.  Covid-19 vaccine status: Information provided on how to obtain vaccines.   Qualifies for Shingles Vaccine? Yes   Zostavax completed No   Shingrix Completed?: No.    Education has been provided regarding the importance of this vaccine. Patient has been advised to call insurance company to determine out of pocket expense if they have not yet received this vaccine. Advised may also  receive vaccine at local pharmacy or Health Dept. Verbalized acceptance and understanding.  Screening Tests Health Maintenance  Topic Date Due   Hepatitis C Screening  Never done   DTaP/Tdap/Td (1 - Tdap) Never done   Zoster Vaccines- Shingrix (1 of 2) Never done   Pneumonia Vaccine 34+ Years old (1 of 1 - PCV) Never done   COVID-19 Vaccine (6 - 2023-24 season) 07/08/2023   Medicare Annual Wellness (AWV)  07/25/2023   INFLUENZA VACCINE  02/04/2024 (Originally 06/07/2023)   Colonoscopy  02/25/2025   DEXA SCAN  Completed   HPV VACCINES  Aged Out   MAMMOGRAM  Discontinued    Health Maintenance  Health Maintenance Due  Topic Date Due   Hepatitis C Screening  Never done   DTaP/Tdap/Td (1 - Tdap) Never done   Zoster Vaccines- Shingrix (1 of 2) Never done   Pneumonia Vaccine 50+ Years old (1 of 1 - PCV) Never done   COVID-19 Vaccine (6 - 2023-24 season) 07/08/2023   Medicare Annual Wellness (AWV)  07/25/2023    Colorectal cancer screening: Type of screening: Colonoscopy. Completed 02/26/15. Repeat every 10 years  Mammogram status: No longer required due to double mastectomy.  Bone Density status: Completed 03/17/21. Results reflect: Bone density results: NORMAL. Repeat every 2 years. Pt states she was told to repeat in 3 yrs.  Lung Cancer Screening: (Low Dose CT Chest recommended if Age 36-80 years, 20 pack-year currently smoking OR have quit w/in 15years.) does not qualify.   Additional Screening:  Hepatitis C Screening: does qualify; Completed  N/a  Vision Screening: Recommended annual ophthalmology exams for early detection of glaucoma and other disorders of the eye. Is the patient up to date with their annual eye exam?  Yes  Who is the provider or what is the name of the office in which the patient attends annual eye exams? [facility] If pt is not established with a provider, would they like to be referred to a provider to establish care? No .   Dental Screening: Recommended annual dental exams for proper oral hygiene  Diabetic Foot Exam: N/a  Community Resource Referral / Chronic Care Management: CRR required this visit?  No   CCM required this visit?  No     Plan:     I have personally reviewed and noted the following in the patient's chart:   Medical and social history Use of alcohol, tobacco or illicit drugs  Current medications and supplements including opioid prescriptions. Patient is currently taking opioid prescriptions. Information provided to patient regarding non-opioid alternatives. Patient advised to discuss non-opioid treatment plan with their provider. Functional ability and status Nutritional status Physical activity Advanced directives List of other physicians Hospitalizations, surgeries, and ER visits in previous 12 months Vitals Screenings to include cognitive, depression, and falls Referrals and appointments  In addition, I have reviewed and discussed with patient certain preventive protocols, quality metrics, and best practice recommendations. A written personalized care plan for preventive services as well as general preventive health recommendations were provided to patient.     Donne Anon, CMA   08/02/2023   After Visit Summary: (MyChart) Due to this being a telephonic visit, the after visit summary with patients personalized plan was offered to patient via MyChart   Nurse Notes: None

## 2023-09-27 ENCOUNTER — Encounter: Payer: Medicare Other | Admitting: Registered Nurse

## 2023-09-27 ENCOUNTER — Encounter: Payer: Medicare Other | Attending: Physical Medicine and Rehabilitation | Admitting: Registered Nurse

## 2023-09-27 ENCOUNTER — Encounter: Payer: Self-pay | Admitting: Registered Nurse

## 2023-09-27 VITALS — BP 118/77 | HR 62 | Ht 66.0 in | Wt 164.0 lb

## 2023-09-27 DIAGNOSIS — Z79891 Long term (current) use of opiate analgesic: Secondary | ICD-10-CM | POA: Diagnosis not present

## 2023-09-27 DIAGNOSIS — G894 Chronic pain syndrome: Secondary | ICD-10-CM | POA: Diagnosis not present

## 2023-09-27 DIAGNOSIS — G620 Drug-induced polyneuropathy: Secondary | ICD-10-CM | POA: Diagnosis not present

## 2023-09-27 DIAGNOSIS — I890 Lymphedema, not elsewhere classified: Secondary | ICD-10-CM | POA: Diagnosis not present

## 2023-09-27 DIAGNOSIS — T451X5A Adverse effect of antineoplastic and immunosuppressive drugs, initial encounter: Secondary | ICD-10-CM | POA: Diagnosis not present

## 2023-09-27 DIAGNOSIS — Z5181 Encounter for therapeutic drug level monitoring: Secondary | ICD-10-CM

## 2023-09-27 MED ORDER — GABAPENTIN 600 MG PO TABS: 600.0000 mg | ORAL_TABLET | Freq: Two times a day (BID) | ORAL | 3 refills | Status: DC

## 2023-09-27 MED ORDER — GABAPENTIN 300 MG PO CAPS: 900.0000 mg | ORAL_CAPSULE | Freq: Every day | ORAL | 3 refills | Status: DC

## 2023-09-27 MED ORDER — HYDROCODONE-ACETAMINOPHEN 10-325 MG PO TABS: 1.0000 | ORAL_TABLET | Freq: Four times a day (QID) | ORAL | 0 refills | Status: DC | PRN

## 2023-09-27 NOTE — Progress Notes (Signed)
Subjective:    Patient ID: [name], female    DOB: [birth year], 67 y.o.   MRN: [id number]  HPI: Kristi [name] is a 67 y.o. female who returns for follow up appointment for chronic pain and medication refill. She states her pain is located in her Bilateral hands and feet with tingling and burning.  She rates her pain 5. Her current exercise regime is walking and performing stretching exercises.  [name] Morphine equivalent is 40.00 MME.   Last UDS was Performed on 07/24/2023, it was consistent.    Pain Inventory Average Pain 5 Pain Right Now 5 My pain is intermittent, burning, stabbing, and aching  In the last 24 hours, has pain interfered with the following? General activity 4 Relation with others 3 Enjoyment of life 5 What TIME of day is your pain at its worst? morning , daytime, evening, and night Sleep (in general) Good  Pain is worse with: walking, sitting, inactivity, and standing Pain improves with: therapy/exercise and medication Relief from Meds: 6  Family History  Problem Relation Age of Onset   Lung cancer Father    Hypertension Father    Thyroid cancer Father        dx in his 77s   Cancer Father        lung, thyroid, smoker   Breast cancer Paternal Aunt 43   Colon cancer Paternal Aunt        dx in her 50x   Cervical cancer Paternal Aunt        dzx in her 16s   Ovarian cancer Cousin        dx in her lage 12s   Breast cancer Cousin        maternal first cousin, once removed; dx in her late 68s   Breast cancer Cousin        maternal first cousin once removed; dx in late 24s   Hypertension Mother    Diabetes Mother    Dementia Mother    Memory loss Mother    Hypertension Brother    Seizures Brother        Alcohol induced.   Alcohol abuse Brother        drinker, smoker   COPD Brother    Cancer Paternal Uncle        oral cancer   Kidney cancer Paternal Grandmother    Arthritis Daughter        back surgery   Cancer Cousin        several  paternal cousins with brain cancer, leukemia, and other cancers   Cancer Sister        stomach   Social History   Socioeconomic History   Marital status: Married    Spouse name: [name]   Number of children: 1   Years of education: 12   Highest education level: Not on file  Occupational History   Occupation: INVENTORY Office manager: [employer]   Occupation: disability no longer working 2015  Tobacco Use   Smoking status: Never   Smokeless tobacco: Never  Vaping Use   Vaping status: Never Used  Substance and Sexual Activity   Alcohol use: No   Drug use: No   Sexual activity: Yes    Comment: lives with husband, disability/retirement. [employer], no dietary restrictions  Other Topics Concern   Not on file  Social History Narrative   Patient is married [name]) and lives at home with her  husband.   Patient has one daughter   Patient drinks very little caffeine.   Left handed   Social Determinants of Health   Financial Resource Strain: Low Risk  (08/02/2023)   Overall Financial Resource Strain (CARDIA)    Difficulty of Paying Living Expenses: Not hard at all  Food Insecurity: No Food Insecurity (08/02/2023)   Hunger Vital Sign    Worried About Running Out of Food in the Last Year: Never true    Ran Out of Food in the Last Year: Never true  Transportation Needs: No Transportation Needs (08/02/2023)   PRAPARE - Administrator, Civil Service (Medical): No    Lack of Transportation (Non-Medical): No  Physical Activity: Sufficiently Active (08/02/2023)   Exercise Vital Sign    Days of Exercise per Week: 7 days    Minutes of Exercise per Session: 120 min  Stress: No Stress Concern Present (08/02/2023)   [employer] of Occupational Health - Occupational Stress Questionnaire    Feeling of Stress : Not at all  Social Connections: Moderately Integrated (08/02/2023)   Social Connection and Isolation Panel [NHANES]    Frequency of  Communication with Friends and Family: More than three times a week    Frequency of Social Gatherings with Friends and Family: Twice a week    Attends Religious Services: More than 4 times per year    Active Member of [employer] or Organizations: No    Attends Banker Meetings: Never    Marital Status: Married   Past Surgical History:  Procedure Laterality Date   ANAL SPHINCTEROTOMY  04/2011   APPENDECTOMY  1980   AXILLARY LYMPH NODE DISSECTION Left 02/04/2013   Procedure: LEFT AXILLARY LYMPH NODE DISSECTION;  Surgeon: Almond Lint, MD;  Location: [facility];  Service: General;  Laterality: Left;  End: 1512   BREAST LUMPECTOMY WITH NEEDLE LOCALIZATION Left 02/04/2013   Procedure: LEFT BREAST LUMPECTOMY WITH NEEDLE LOCALIZATION;  Surgeon: Almond Lint, MD;  Location: [facility];  Service: General;  Laterality: Left;   BREAST SURGERY     Lumpectomy in april 2014   HEMORRHOID SURGERY  04/2011   ligation   MASTECTOMY Left 02/15/2017   PORT-A-CATH REMOVAL N/A 04/16/2014   Procedure: REMOVAL PORT-A-CATH;  Surgeon: Almond Lint, MD;  Location: [facility];  Service: General;  Laterality: N/A;   PORTACATH PLACEMENT Right 02/04/2013   Procedure: INSERTION PORT-A-CATH;  Surgeon: Almond Lint, MD;  Location: [facility];  Service: General;  Laterality: Right;  Start Time: 2130.   SHOULDER ARTHROSCOPY WITH ROTATOR CUFF REPAIR AND SUBACROMIAL DECOMPRESSION Left 02/24/2014   Procedure: SHOULDER ARTHROSCOPY WITH ROTATOR CUFF REPAIR AND SUBACROMIAL DECOMPRESSION;  Surgeon: Cammy Copa, MD;  Location: [facility] OR;  Service: Orthopedics;  Laterality: Left;  LEFT SHOULDER DIAGNOSTIC OPERATIVE ARTHROSCOPY, SUBACROMIAL DECOMPRESSION, ROTATOR CUFF TEAR REPAIR   SIMPLE MASTECTOMY WITH AXILLARY SENTINEL NODE BIOPSY Left 02/15/2017   Procedure: LEFT MASTECTOMY;  Surgeon: Almond Lint, MD;  Location: [facility];  Service: General;  Laterality: Left;   TOTAL MASTECTOMY Right 12/26/2018   Procedure: RIGHT BREAST PROPHYLATIC MASTECTOMY;   Surgeon: Almond Lint, MD;  Location: [facility];  Service: General;  Laterality: Right;   Past Surgical History:  Procedure Laterality Date   ANAL SPHINCTEROTOMY  04/2011   APPENDECTOMY  1980   AXILLARY LYMPH NODE DISSECTION Left 02/04/2013   Procedure: LEFT AXILLARY LYMPH NODE DISSECTION;  Surgeon: Almond Lint, MD;  Location: [facility];  Service: General;  Laterality: Left;  End: 1512   BREAST  LUMPECTOMY WITH NEEDLE LOCALIZATION Left 02/04/2013   Procedure: LEFT BREAST LUMPECTOMY WITH NEEDLE LOCALIZATION;  Surgeon: Almond Lint, MD;  Location: [facility];  Service: General;  Laterality: Left;   BREAST SURGERY     Lumpectomy in april 2014   HEMORRHOID SURGERY  04/2011   ligation   MASTECTOMY Left 02/15/2017   PORT-A-CATH REMOVAL N/A 04/16/2014   Procedure: REMOVAL PORT-A-CATH;  Surgeon: Almond Lint, MD;  Location: [facility];  Service: General;  Laterality: N/A;   PORTACATH PLACEMENT Right 02/04/2013   Procedure: INSERTION PORT-A-CATH;  Surgeon: Almond Lint, MD;  Location: [facility];  Service: General;  Laterality: Right;  Start Time: 1610.   SHOULDER ARTHROSCOPY WITH ROTATOR CUFF REPAIR AND SUBACROMIAL DECOMPRESSION Left 02/24/2014   Procedure: SHOULDER ARTHROSCOPY WITH ROTATOR CUFF REPAIR AND SUBACROMIAL DECOMPRESSION;  Surgeon: Cammy Copa, MD;  Location: [facility] OR;  Service: Orthopedics;  Laterality: Left;  LEFT SHOULDER DIAGNOSTIC OPERATIVE ARTHROSCOPY, SUBACROMIAL DECOMPRESSION, ROTATOR CUFF TEAR REPAIR   SIMPLE MASTECTOMY WITH AXILLARY SENTINEL NODE BIOPSY Left 02/15/2017   Procedure: LEFT MASTECTOMY;  Surgeon: Almond Lint, MD;  Location: [facility];  Service: General;  Laterality: Left;   TOTAL MASTECTOMY Right 12/26/2018   Procedure: RIGHT BREAST PROPHYLATIC MASTECTOMY;  Surgeon: Almond Lint, MD;  Location: [facility];  Service: General;  Laterality: Right;   Past Medical History:  Diagnosis Date   Abnormal gait 12/17/2019   Acute blood loss anemia    Anal fissure 05/03/2011   Anal pain 04/27/2021   Anemia     Iron deficinecy anemia   Anemia 05/14/2017   Anxiety and depression 05/15/2014   Arthritis    Body mass index (BMI) 30.0-30.9, adult 12/17/2019   Breast cancer (HCC)    left ,last radiation 2'15, last chemo 8'14   Cervical cancer screening 06/18/2018   Menarche at 11 Regular and moderate flow  history of abnormal pap in past, 1 mild abnormality years ago that resolved spontaneously on recheck G1P1, s/p 1 svd history of abnormal MGM, b/o breast cancer 2014 No concerns today  gyn surgeries. Lumpectomy  LMP around early 2014   Cervical stenosis of spine 12/17/2019   Constipation 11/13/2016   Contracture of axilla 05/20/2013   Debility 01/18/2020   Essential (primary) hypertension 12/17/2019   Flushing 02/20/2014   Gait instability 12/17/2019   Genetic testing 02/20/2019   Negative genetic testing on the Comprehensive Cancer Panel.  The Comprehensive Common Cancer Panel offered by [facility] includes sequencing and/or deletion duplication testing of the following 46 genes: APC, ATM, AXIN2, BAP1, BARD1, BMPR1A, BRCA1, BRCA2, BRIP1, CDH1, CDK4, CDKN2A, CHEK2, EPCAM, FANCC, FH, FLCN, HOXB13, MET, MITF,  MLH1, MSH2, MSH6, MUTYH, NBN, NF1, NTHL1,  PALB2, PMS2, POLD1, POLE, P   H/O: CVA (cerebrovascular accident) 06/19/2020   Hereditary and idiopathic peripheral neuropathy 11/11/2013   Herpes zoster 05/15/2014   History of chicken pox    History of left breast cancer 02/15/2017   History of radiation therapy 09/09/13-10/28/13   45 gray to left breast, lumpectomy cavity boosted to 63 gray   HTN (hypertension) 11/13/2016   Hyperglycemia 01/09/2016   Hyperlipidemia 05/14/2017   Hypertension    Internal hemorrhoids without complication 05/03/2011   Knee pain, bilateral 11/14/2016   Left upper extremity numbness 06/19/2018   Major depressive disorder, recurrent episode, severe (HCC) 01/12/2014   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 Overview:  Left IDC, ER 3%, PR-, Her2neu-  Last Assessment & Plan:  Pt will continue to take her tamoxifen as previously directed. She appears to be tolerating this with only c/o moderate, occ hot flashes.   Mild neurocognitive disorder due to multiple etiologies 03/17/2020   MRSA (methicillin resistant Staphylococcus aureus) 2009   right groin area-no issues now. 04-07-14 PCR screen negative today.   Neuropathy    Neuropathy of hand, left 09/21/2015   Nodule of finger of left hand 11/14/2016   Palpitations 07/30/2020   Personal history of colonic polyps 04/27/2021   Preventative health care 11/13/2016   Right hip pain 11/14/2016   S/P mastectomy, right 12/26/2018   Sebaceous cyst 04/12/2013   Shoulder joint pain 12/12/2013   Spinal stenosis in cervical region 10/09/2019   Strain of rotator cuff 02/24/2014   Stroke (HCC) 06/2020   Vertigo    Ht 5\' 6"  (1.676 m)   Wt 164 lb (74.4 kg)   LMP 01/20/2013   BMI 26.47 kg/m   Opioid Risk Score:   Fall Risk Score:  `1  Depression screen PHQ 2/9     08/02/2023    2:25 PM 07/24/2023   12:51 PM 06/08/2023    1:44 PM 03/29/2023    1:17 PM 02/15/2023   12:46 PM 01/04/2023    2:38 PM 11/30/2022    2:24 PM  Depression screen PHQ 2/9  Decreased Interest 0 0 0 0 0 1 0  Down, Depressed, Hopeless 0 0 0 0 0 1 0  PHQ - 2 Score 0 0 0 0 0 2 0  Altered sleeping   0      Tired, decreased energy   0      Change in appetite   0      Feeling bad or failure about yourself    0      Trouble concentrating   0      Moving slowly or fidgety/restless   0      Suicidal thoughts   0      PHQ-9 Score   0      Difficult doing work/chores   Not difficult at all          Review of Systems  Musculoskeletal:  Positive for gait problem.       Left shoulder pain B/L hand and foot pain   All other systems reviewed and are negative.     Objective:   Physical Exam Vitals and nursing note  reviewed.  Constitutional:      Appearance: Normal appearance.  Cardiovascular:     Rate and Rhythm: Normal rate and regular rhythm.     Pulses: Normal pulses.     Heart sounds: Normal heart sounds.  Pulmonary:     Effort: Pulmonary effort is normal.     Breath sounds: Normal breath sounds.  Musculoskeletal:     Comments: Normal Muscle Bulk and Muscle Testing Reveals:  Upper Extremities: Full ROM and Muscle Strength 5/5 Lower Extremities: Full ROM and Muscle Strength 5/5 Arises from Table with ease Narrow Based  Gait     Skin:    General: Skin is warm and dry.  Neurological:     Mental Status: She is alert and oriented to person, place, and time.  Psychiatric:        Mood and Affect: Mood normal.        Behavior: Behavior normal.         Assessment & Plan:  1. Chemotherapy Induced Peripheral  Neuropathy: Continue current medication regimen with Gabapentin. Continue to Monitor. 09/27/2023 2. Chronic Pain Syndrome: Refilled: Tramadol 50 mg one tablet twice a day for breakthrough pain #60 and  Continue Hydrocodone 10/325 mg one tablet4 times a day as needed for pain #120. We will continue the opioid monitoring program, this consists of regular clinic visits, examinations, urine drug screen, pill counts as well as use of [place] Controlled Substance Reporting system. A 12 month History has been reviewed on the [place] Controlled Substance Reporting System on 09/27/2023 3. Polyarthralgia: Continue HEP as Tolerated: Continue to Monitor. 09/27/2023   F/U in 2 months

## 2023-11-07 DIAGNOSIS — H269 Unspecified cataract: Secondary | ICD-10-CM

## 2023-11-07 HISTORY — DX: Unspecified cataract: H26.9

## 2023-11-21 ENCOUNTER — Other Ambulatory Visit: Payer: Self-pay | Admitting: Family Medicine

## 2023-11-21 NOTE — Progress Notes (Signed)
Subjective:    Patient ID: Kristi [name], female    DOB: [birth year], 68 y.o.   MRN: [id number]  HPI: Kristi [name] is a 68 y.o. female who returns for follow up appointment for chronic pain and medication refill. She states her pain is located in her bilateral hands and bilateral feet with tingling. Also reports left shoulder pain denies falling and generalized joint pain. She rates her pain 4. Her current exercise regime is  attending the [facility] three days a week, walking and performing stretching exercises.  Ms. Traino Morphine equivalent is 40.00 MME.   UDS ordered Today.      Pain Inventory Average Pain 5 Pain Right Now 4 My pain is sharp, burning, and tingling  In the last 24 hours, has pain interfered with the following? General activity 4 Relation with others 3 Enjoyment of life 5 What TIME of day is your pain at its worst? morning , daytime, evening, and night Sleep (in general) Fair  Pain is worse with: walking, sitting, inactivity, and standing Pain improves with: therapy/exercise and medication Relief from Meds: 5  Family History  Problem Relation Age of Onset   Lung cancer Father    Hypertension Father    Thyroid cancer Father        dx in his 34s   Cancer Father        lung, thyroid, smoker   Breast cancer Paternal Aunt 35   Colon cancer Paternal Aunt        dx in her 50x   Cervical cancer Paternal Aunt        dzx in her 8s   Ovarian cancer Cousin        dx in her lage 79s   Breast cancer Cousin        maternal first cousin, once removed; dx in her late 4s   Breast cancer Cousin        maternal first cousin once removed; dx in late 75s   Hypertension Mother    Diabetes Mother    Dementia Mother    Memory loss Mother    Hypertension Brother    Seizures Brother        Alcohol induced.   Alcohol abuse Brother        drinker, smoker   COPD Brother    Cancer Paternal Uncle        oral cancer   Kidney cancer Paternal Grandmother    Arthritis  Daughter        back surgery   Cancer Cousin        several paternal cousins with brain cancer, leukemia, and other cancers   Cancer Sister        stomach   Social History   Socioeconomic History   Marital status: Married    Spouse name: [name]   Number of children: 1   Years of education: 12   Highest education level: Not on file  Occupational History   Occupation: INVENTORY Office manager: [employer]   Occupation: disability no longer working 2015  Tobacco Use   Smoking status: Never   Smokeless tobacco: Never  Vaping Use   Vaping status: Never Used  Substance and Sexual Activity   Alcohol use: No   Drug use: No   Sexual activity: Yes    Comment: lives with husband, disability/retirement. [employer], no dietary restrictions  Other Topics Concern   Not on file  Social History Narrative  Patient is married [name]) and lives at home with her husband.   Patient has one daughter   Patient drinks very little caffeine.   Left handed   Social Drivers of Health   Financial Resource Strain: Low Risk  (08/02/2023)   Overall Financial Resource Strain (CARDIA)    Difficulty of Paying Living Expenses: Not hard at all  Food Insecurity: No Food Insecurity (08/02/2023)   Hunger Vital Sign    Worried About Running Out of Food in the Last Year: Never true    Ran Out of Food in the Last Year: Never true  Transportation Needs: No Transportation Needs (08/02/2023)   PRAPARE - Administrator, Civil Service (Medical): No    Lack of Transportation (Non-Medical): No  Physical Activity: Sufficiently Active (08/02/2023)   Exercise Vital Sign    Days of Exercise per Week: 7 days    Minutes of Exercise per Session: 120 min  Stress: No Stress Concern Present (08/02/2023)   [employer] of Occupational Health - Occupational Stress Questionnaire    Feeling of Stress : Not at all  Social Connections: Moderately Integrated (08/02/2023)   Social  Connection and Isolation Panel [NHANES]    Frequency of Communication with Friends and Family: More than three times a week    Frequency of Social Gatherings with Friends and Family: Twice a week    Attends Religious Services: More than 4 times per year    Active Member of [employer] or Organizations: No    Attends Banker Meetings: Never    Marital Status: Married   Past Surgical History:  Procedure Laterality Date   ANAL SPHINCTEROTOMY  04/2011   APPENDECTOMY  1980   AXILLARY LYMPH NODE DISSECTION Left 02/04/2013   Procedure: LEFT AXILLARY LYMPH NODE DISSECTION;  Surgeon: Almond Lint, MD;  Location: [facility];  Service: General;  Laterality: Left;  End: 1512   BREAST LUMPECTOMY WITH NEEDLE LOCALIZATION Left 02/04/2013   Procedure: LEFT BREAST LUMPECTOMY WITH NEEDLE LOCALIZATION;  Surgeon: Almond Lint, MD;  Location: [facility];  Service: General;  Laterality: Left;   BREAST SURGERY     Lumpectomy in april 2014   HEMORRHOID SURGERY  04/2011   ligation   MASTECTOMY Left 02/15/2017   PORT-A-CATH REMOVAL N/A 04/16/2014   Procedure: REMOVAL PORT-A-CATH;  Surgeon: Almond Lint, MD;  Location: [facility];  Service: General;  Laterality: N/A;   PORTACATH PLACEMENT Right 02/04/2013   Procedure: INSERTION PORT-A-CATH;  Surgeon: Almond Lint, MD;  Location: [facility];  Service: General;  Laterality: Right;  Start Time: 1914.   SHOULDER ARTHROSCOPY WITH ROTATOR CUFF REPAIR AND SUBACROMIAL DECOMPRESSION Left 02/24/2014   Procedure: SHOULDER ARTHROSCOPY WITH ROTATOR CUFF REPAIR AND SUBACROMIAL DECOMPRESSION;  Surgeon: Cammy Copa, MD;  Location[facility] OR;  Service: Orthopedics;  Laterality: Left;  LEFT SHOULDER DIAGNOSTIC OPERATIVE ARTHROSCOPY, SUBACROMIAL DECOMPRESSION, ROTATOR CUFF TEAR REPAIR   SIMPLE MASTECTOMY WITH AXILLARY SENTINEL NODE BIOPSY Left 02/15/2017   Procedure: LEFT MASTECTOMY;  Surgeon: Almond Lint, MD;  Location: [facility];  Service: General;  Laterality: Left;   TOTAL MASTECTOMY Right 12/26/2018    Procedure: RIGHT BREAST PROPHYLATIC MASTECTOMY;  Surgeon: Almond Lint, MD;  Location: [facility];  Service: General;  Laterality: Right;   Past Surgical History:  Procedure Laterality Date   ANAL SPHINCTEROTOMY  04/2011   APPENDECTOMY  1980   AXILLARY LYMPH NODE DISSECTION Left 02/04/2013   Procedure: LEFT AXILLARY LYMPH NODE DISSECTION;  Surgeon: Almond Lint, MD;  Location: [facility];  Service:  General;  Laterality: Left;  End: 1512   BREAST LUMPECTOMY WITH NEEDLE LOCALIZATION Left 02/04/2013   Procedure: LEFT BREAST LUMPECTOMY WITH NEEDLE LOCALIZATION;  Surgeon: Almond Lint, MD;  [facility]: [facility];  Service: General;  Laterality: Left;   BREAST SURGERY     Lumpectomy in april 2014   HEMORRHOID SURGERY  04/2011   ligation   MASTECTOMY Left 02/15/2017   PORT-A-CATH REMOVAL N/A 04/16/2014   Procedure: REMOVAL PORT-A-CATH;  Surgeon: Almond Lint, MD;  Location: [facility];  Service: General;  Laterality: N/A;   PORTACATH PLACEMENT Right 02/04/2013   Procedure: INSERTION PORT-A-CATH;  Surgeon: Almond Lint, MD;  Location: [facility];  Service: General;  Laterality: Right;  Start Time: 0981.   SHOULDER ARTHROSCOPY WITH ROTATOR CUFF REPAIR AND SUBACROMIAL DECOMPRESSION Left 02/24/2014   Procedure: SHOULDER ARTHROSCOPY WITH ROTATOR CUFF REPAIR AND SUBACROMIAL DECOMPRESSION;  Surgeon: Cammy Copa, MD;  Location: [facility] OR;  Service: Orthopedics;  Laterality: Left;  LEFT SHOULDER DIAGNOSTIC OPERATIVE ARTHROSCOPY, SUBACROMIAL DECOMPRESSION, ROTATOR CUFF TEAR REPAIR   SIMPLE MASTECTOMY WITH AXILLARY SENTINEL NODE BIOPSY Left 02/15/2017   Procedure: LEFT MASTECTOMY;  Surgeon: Almond Lint, MD;  Location: [facility];  Service: General;  Laterality: Left;   TOTAL MASTECTOMY Right 12/26/2018   Procedure: RIGHT BREAST PROPHYLATIC MASTECTOMY;  Surgeon: Almond Lint, MD;  Location: [facility];  Service: General;  Laterality: Right;   Past Medical History:  Diagnosis Date   Abnormal gait 12/17/2019   Acute blood loss anemia    Anal  fissure 05/03/2011   Anal pain 04/27/2021   Anemia    Iron deficinecy anemia   Anemia 05/14/2017   Anxiety and depression 05/15/2014   Arthritis    Body mass index (BMI) 30.0-30.9, adult 12/17/2019   Breast cancer (HCC)    left ,last radiation 2'15, last chemo 8'14   Cervical cancer screening 06/18/2018   Menarche at 11 Regular and moderate flow  history of abnormal pap in past, 1 mild abnormality years ago that resolved spontaneously on recheck G1P1, s/p 1 svd history of abnormal MGM, b/o breast cancer 2014 No concerns today  gyn surgeries. Lumpectomy  LMP around early 2014   Cervical stenosis of spine 12/17/2019   Constipation 11/13/2016   Contracture of axilla 05/20/2013   Debility 01/18/2020   Essential (primary) hypertension 12/17/2019   Flushing 02/20/2014   Gait instability 12/17/2019   Genetic testing 02/20/2019   Negative genetic testing on the Comprehensive Cancer Panel.  The Comprehensive Common Cancer Panel offered by [facility] includes sequencing and/or deletion duplication testing of the following 46 genes: APC, ATM, AXIN2, BAP1, BARD1, BMPR1A, BRCA1, BRCA2, BRIP1, CDH1, CDK4, CDKN2A, CHEK2, EPCAM, FANCC, FH, FLCN, HOXB13, MET, MITF,  MLH1, MSH2, MSH6, MUTYH, NBN, NF1, NTHL1,  PALB2, PMS2, POLD1, POLE, P   H/O: CVA (cerebrovascular accident) 06/19/2020   Hereditary and idiopathic peripheral neuropathy 11/11/2013   Herpes zoster 05/15/2014   History of chicken pox    History of left breast cancer 02/15/2017   History of radiation therapy 09/09/13-10/28/13   45 gray to left breast, lumpectomy cavity boosted to 63 gray   HTN (hypertension) 11/13/2016   Hyperglycemia 01/09/2016   Hyperlipidemia 05/14/2017   Hypertension    Internal hemorrhoids without complication 05/03/2011   Knee pain, bilateral 11/14/2016   Left upper extremity numbness 06/19/2018   Major depressive disorder, recurrent episode, severe (HCC) 01/12/2014   Malignant neoplasm of upper-inner quadrant of female  breast (HCC) 01/24/2013    Left IDC, ER 3%, PR-, Her2neu-  Formatting of  this note might be different from the original. Overview:   Left IDC, ER 3%, PR-, Her2neu-  Last Assessment & Plan:  Pt will continue to take her tamoxifen as previously directed. She appears to be tolerating this with only c/o moderate, occ hot flashes.   Mild neurocognitive disorder due to multiple etiologies 03/17/2020   MRSA (methicillin resistant Staphylococcus aureus) 2009   right groin area-no issues now. 04-07-14 PCR screen negative today.   Neuropathy    Neuropathy of hand, left 09/21/2015   Nodule of finger of left hand 11/14/2016   Palpitations 07/30/2020   Personal history of colonic polyps 04/27/2021   Preventative health care 11/13/2016   Right hip pain 11/14/2016   S/P mastectomy, right 12/26/2018   Sebaceous cyst 04/12/2013   Shoulder joint pain 12/12/2013   Spinal stenosis in cervical region 10/09/2019   Strain of rotator cuff 02/24/2014   Stroke (HCC) 06/2020   Vertigo    LMP 01/20/2013   Opioid Risk Score:   Fall Risk Score:  `1  Depression screen PHQ 2/9     08/02/2023    2:25 PM 07/24/2023   12:51 PM 06/08/2023    1:44 PM 03/29/2023    1:17 PM 02/15/2023   12:46 PM 01/04/2023    2:38 PM 11/30/2022    2:24 PM  Depression screen PHQ 2/9  Decreased Interest 0 0 0 0 0 1 0  Down, Depressed, Hopeless 0 0 0 0 0 1 0  PHQ - 2 Score 0 0 0 0 0 2 0  Altered sleeping   0      Tired, decreased energy   0      Change in appetite   0      Feeling bad or failure about yourself    0      Trouble concentrating   0      Moving slowly or fidgety/restless   0      Suicidal thoughts   0      PHQ-9 Score   0      Difficult doing work/chores   Not difficult at all        Review of Systems  Musculoskeletal:        Pain in the left shoulder, pain both hands, pain in both feet  All other systems reviewed and are negative.      Objective:   Physical Exam Vitals and nursing note reviewed.  Constitutional:       Appearance: Normal appearance.  Cardiovascular:     Rate and Rhythm: Normal rate and regular rhythm.     Pulses: Normal pulses.     Heart sounds: Normal heart sounds.  Pulmonary:     Effort: Pulmonary effort is normal.     Breath sounds: Normal breath sounds.  Musculoskeletal:     Comments: Normal Muscle Bulk and Muscle Testing Reveals:  Upper Extremities: Right: Full ROM and Muscle Strength  5/5 Left Upper Extremity: Decreased ROM 90 Degrees and Muscle Strength 5/5 Lower Extremities: Full ROM and Muscle Strength 5/5 Arises from Table with ease Narrow Based  Gait     Skin:    General: Skin is warm and dry.  Neurological:     Mental Status: She is alert and oriented to person, place, and time.  Psychiatric:        Mood and Affect: Mood normal.        Behavior: Behavior normal.         Assessment & Plan:  1.  Chemotherapy Induced Peripheral Neuropathy: Continue current medication regimen with Gabapentin. Continue to Monitor. 11/21/2024 2. Chronic Pain Syndrome: Refilled: Tramadol 50 mg one tablet twice a day for breakthrough pain #60 and  Continue Hydrocodone 10/325 mg one tablet4 times a day as needed for pain #120. We will continue the opioid monitoring program, this consists of regular clinic visits, examinations, urine drug screen, pill counts as well as use of [place] Controlled Substance Reporting system. A 12 month History has been reviewed on the [place] Controlled Substance Reporting System on 11/22/2023 3. Polyarthralgia: Continue HEP as Tolerated: Continue to Monitor. 11/22/2023 4. Left Shoulder Pain: Denies falling. Continue HEP as Tolerated. Continue to Monitor. Refuses X-ray at this time.  F/U in 2 months

## 2023-11-22 ENCOUNTER — Encounter: Payer: Medicare Other | Attending: Physical Medicine and Rehabilitation | Admitting: Registered Nurse

## 2023-11-22 ENCOUNTER — Encounter: Payer: Self-pay | Admitting: Registered Nurse

## 2023-11-22 VITALS — BP 116/72 | HR 70 | Ht 66.0 in | Wt 167.0 lb

## 2023-11-22 DIAGNOSIS — G620 Drug-induced polyneuropathy: Secondary | ICD-10-CM | POA: Diagnosis not present

## 2023-11-22 DIAGNOSIS — G894 Chronic pain syndrome: Secondary | ICD-10-CM | POA: Insufficient documentation

## 2023-11-22 DIAGNOSIS — T451X5A Adverse effect of antineoplastic and immunosuppressive drugs, initial encounter: Secondary | ICD-10-CM | POA: Diagnosis not present

## 2023-11-22 DIAGNOSIS — M25512 Pain in left shoulder: Secondary | ICD-10-CM | POA: Insufficient documentation

## 2023-11-22 DIAGNOSIS — M2550 Pain in unspecified joint: Secondary | ICD-10-CM | POA: Insufficient documentation

## 2023-11-22 DIAGNOSIS — Z79891 Long term (current) use of opiate analgesic: Secondary | ICD-10-CM | POA: Insufficient documentation

## 2023-11-22 DIAGNOSIS — Z5181 Encounter for therapeutic drug level monitoring: Secondary | ICD-10-CM | POA: Diagnosis not present

## 2023-11-22 DIAGNOSIS — G8929 Other chronic pain: Secondary | ICD-10-CM | POA: Diagnosis not present

## 2023-11-22 DIAGNOSIS — I890 Lymphedema, not elsewhere classified: Secondary | ICD-10-CM | POA: Diagnosis not present

## 2023-11-22 MED ORDER — HYDROCODONE-ACETAMINOPHEN 10-325 MG PO TABS: 1.0000 | ORAL_TABLET | Freq: Four times a day (QID) | ORAL | 0 refills | Status: DC | PRN

## 2023-11-22 MED ORDER — GABAPENTIN 300 MG PO CAPS: 900.0000 mg | ORAL_CAPSULE | Freq: Every day | ORAL | 3 refills | Status: DC

## 2023-11-22 MED ORDER — GABAPENTIN 600 MG PO TABS: 600.0000 mg | ORAL_TABLET | Freq: Two times a day (BID) | ORAL | 3 refills | Status: DC

## 2023-11-26 LAB — TOXASSURE SELECT,+ANTIDEPR,UR

## 2023-12-07 ENCOUNTER — Other Ambulatory Visit: Payer: Self-pay | Admitting: Family

## 2023-12-07 DIAGNOSIS — I10 Essential (primary) hypertension: Secondary | ICD-10-CM

## 2023-12-09 NOTE — Assessment & Plan Note (Signed)
 Encourage heart healthy diet such as MIND or DASH diet, increase exercise, avoid trans fats, simple carbohydrates and processed foods, consider a krill or fish or flaxseed oil cap daily.

## 2023-12-09 NOTE — Assessment & Plan Note (Signed)
 [id number] acceptable, minimize simple carbs. Increase exercise as tolerated.

## 2023-12-09 NOTE — Assessment & Plan Note (Signed)
 Well controlled, no changes to meds. Encouraged heart healthy diet such as the DASH diet and exercise as tolerated.

## 2023-12-13 ENCOUNTER — Ambulatory Visit (INDEPENDENT_AMBULATORY_CARE_PROVIDER_SITE_OTHER): Payer: Medicare Other | Admitting: Family Medicine

## 2023-12-13 VITALS — BP 132/72 | HR 63 | Temp 98.5°F | Resp 16 | Ht 62.0 in | Wt 167.6 lb

## 2023-12-13 DIAGNOSIS — E782 Mixed hyperlipidemia: Secondary | ICD-10-CM | POA: Diagnosis not present

## 2023-12-13 DIAGNOSIS — R739 Hyperglycemia, unspecified: Secondary | ICD-10-CM

## 2023-12-13 DIAGNOSIS — I10 Essential (primary) hypertension: Secondary | ICD-10-CM | POA: Diagnosis not present

## 2023-12-14 LAB — COMPREHENSIVE METABOLIC PANEL
ALT: 13 U/L (ref 0–35)
AST: 20 U/L (ref 0–37)
Albumin: 4.4 g/dL (ref 3.5–5.2)
Alkaline Phosphatase: 100 U/L (ref 39–117)
BUN: 15 mg/dL (ref 6–23)
CO2: 30 meq/L (ref 19–32)
Calcium: 10.1 mg/dL (ref 8.4–10.5)
Chloride: 105 meq/L (ref 96–112)
Creatinine, Ser: 1.08 mg/dL (ref 0.40–1.20)
GFR: 52.64 mL/min — ABNORMAL LOW (ref >60.00)
Glucose, Bld: 92 mg/dL (ref 70–99)
Potassium: 4.9 meq/L (ref 3.5–5.1)
Sodium: 144 meq/L (ref 135–145)
Total Bilirubin: 0.4 mg/dL (ref 0.2–1.2)
Total Protein: 7.3 g/dL (ref 6.0–8.3)

## 2023-12-14 LAB — CBC WITH DIFFERENTIAL/PLATELET
Basophils Absolute: 0 10*3/uL (ref 0.0–0.1)
Basophils Relative: 0.4 % (ref 0.0–3.0)
Eosinophils Absolute: 0.1 10*3/uL (ref 0.0–0.7)
Eosinophils Relative: 1 % (ref 0.0–5.0)
HCT: 38 % (ref 36.0–46.0)
Hemoglobin: 12.2 g/dL (ref 12.0–15.0)
Lymphocytes Relative: 43.6 % (ref 12.0–46.0)
Lymphs Abs: 2.8 10*3/uL (ref 0.7–4.0)
MCHC: 32 g/dL (ref 30.0–36.0)
MCV: 89 fL (ref 78.0–100.0)
Monocytes Absolute: 0.4 10*3/uL (ref 0.1–1.0)
Monocytes Relative: 5.8 % (ref 3.0–12.0)
Neutro Abs: 3.2 10*3/uL (ref 1.4–7.7)
Neutrophils Relative %: 49.2 % (ref 43.0–77.0)
Platelets: 163 10*3/uL (ref 150.0–400.0)
RBC: 4.28 Mil/uL (ref 3.87–5.11)
RDW: 15.2 % (ref 11.5–15.5)
WBC: 6.5 10*3/uL (ref 4.0–10.5)

## 2023-12-14 LAB — HEMOGLOBIN A1C: Hgb A1c MFr Bld: 5.9 % (ref 4.6–6.5)

## 2023-12-14 LAB — LIPID PANEL
Cholesterol: 126 mg/dL (ref 0–200)
HDL: 59.8 mg/dL (ref >39.00)
LDL Cholesterol: 52 mg/dL (ref 0–99)
NonHDL: 66.01
Total CHOL/HDL Ratio: 2
Triglycerides: 72 mg/dL (ref 0.0–149.0)
VLDL: 14.4 mg/dL (ref 0.0–40.0)

## 2023-12-14 LAB — TSH: TSH: 1.32 u[IU]/mL (ref 0.35–5.50)

## 2023-12-17 ENCOUNTER — Encounter: Payer: Self-pay | Admitting: Family Medicine

## 2023-12-17 NOTE — Progress Notes (Signed)
 Subjective:    Patient ID: Kristi [name], female    DOB: June 21, 1956, 68 y.o.   MRN: [id number]  Chief Complaint  Patient presents with   Follow-up    6 month    HPI Discussed the use of AI scribe software for clinical note transcription with the patient, who gave verbal consent to proceed.  History of Present Illness   The patient presents for a routine check-up. She reports no new complaints and is 'trudging along, doing her thing.' She is making efforts to maintain a healthy lifestyle, including regular physical activity, adequate sleep, and a balanced diet. She reports no difficulty sleeping, which is a common issue in aging populations. She has had no recent ER visits, chest pain, or stomach issues. She is making efforts to stay hydrated throughout the day, understanding that as she ages, her body's ability to store water decreases. She has received her COVID and flu vaccines. She has not had any outbreaks of shingles and is considering the shingles vaccine due to a recent study linking shingles outbreaks to an increased risk of dementia.        Past Medical History:  Diagnosis Date   Abnormal gait 12/17/2019   Acute blood loss anemia    Anal fissure 05/03/2011   Anal pain 04/27/2021   Anemia    Iron deficinecy anemia   Anemia 05/14/2017   Anxiety and depression 05/15/2014   Arthritis    Body mass index (BMI) 30.0-30.9, adult 12/17/2019   Breast cancer (HCC)    left ,last radiation 2'15, last chemo 8'14   Cervical cancer screening 06/18/2018   Menarche at 11 Regular and moderate flow  history of abnormal pap in past, 1 mild abnormality years ago that resolved spontaneously on recheck G1P1, s/p 1 svd history of abnormal MGM, b/o breast cancer 2014 No concerns today  gyn surgeries. Lumpectomy  LMP around early 2014   Cervical stenosis of spine 12/17/2019   Constipation 11/13/2016   Contracture of axilla 05/20/2013   Debility 01/18/2020   Essential (primary) hypertension  12/17/2019   Flushing 02/20/2014   Gait instability 12/17/2019   Genetic testing 02/20/2019   Negative genetic testing on the Comprehensive Cancer Panel.  The Comprehensive Common Cancer Panel offered by [facility] includes sequencing and/or deletion duplication testing of the following 46 genes: APC, ATM, AXIN2, BAP1, BARD1, BMPR1A, BRCA1, BRCA2, BRIP1, CDH1, CDK4, CDKN2A, CHEK2, EPCAM, FANCC, FH, FLCN, HOXB13, MET, MITF,  MLH1, MSH2, MSH6, MUTYH, NBN, NF1, NTHL1,  PALB2, PMS2, POLD1, POLE, P   H/O: CVA (cerebrovascular accident) 06/19/2020   Hereditary and idiopathic peripheral neuropathy 11/11/2013   Herpes zoster 05/15/2014   History of chicken pox    History of left breast cancer 02/15/2017   History of radiation therapy 09/09/13-10/28/13   45 gray to left breast, lumpectomy cavity boosted to 63 gray   HTN (hypertension) 11/13/2016   Hyperglycemia 01/09/2016   Hyperlipidemia 05/14/2017   Hypertension    Internal hemorrhoids without complication 05/03/2011   Knee pain, bilateral 11/14/2016   Left upper extremity numbness 06/19/2018   Major depressive disorder, recurrent episode, severe (HCC) 01/12/2014   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 Overview:   Left IDC, ER 3%, PR-, Her2neu-  Last Assessment & Plan:  Pt will continue to take her tamoxifen  as previously directed. She appears to be tolerating this with only  c/o moderate, occ hot flashes.   Mild neurocognitive disorder due to multiple etiologies 03/17/2020   MRSA (methicillin resistant Staphylococcus aureus) 2009   right groin area-no issues now. 04-07-14 PCR screen negative today.   Neuropathy    Neuropathy of hand, left 09/21/2015   Nodule of finger of left hand 11/14/2016   Palpitations 07/30/2020   Personal history of colonic polyps 04/27/2021   Preventative health care 11/13/2016   Right hip pain  11/14/2016   S/P mastectomy, right 12/26/2018   Sebaceous cyst 04/12/2013   Shoulder joint pain 12/12/2013   Spinal stenosis in cervical region 10/09/2019   Strain of rotator cuff 02/24/2014   Stroke (HCC) 06/2020   Vertigo     Past Surgical History:  Procedure Laterality Date   ANAL SPHINCTEROTOMY  04/2011   APPENDECTOMY  1980   AXILLARY LYMPH NODE DISSECTION Left 02/04/2013   Procedure: LEFT AXILLARY LYMPH NODE DISSECTION;  Surgeon: Jina Nephew, MD;  Location: [facility] OR;  Service: General;  Laterality: Left;  End: 1512   BREAST LUMPECTOMY WITH NEEDLE LOCALIZATION Left 02/04/2013   Procedure: LEFT BREAST LUMPECTOMY WITH NEEDLE LOCALIZATION;  Surgeon: Jina Nephew, MD;  Location: [facility] OR;  Service: General;  Laterality: Left;   BREAST SURGERY     Lumpectomy in april 2014   HEMORRHOID SURGERY  04/2011   ligation   MASTECTOMY Left 02/15/2017   PORT-A-CATH REMOVAL N/A 04/16/2014   Procedure: REMOVAL PORT-A-CATH;  Surgeon: Jina Nephew, MD;  Location: [facility];  Service: General;  Laterality: N/A;   PORTACATH PLACEMENT Right 02/04/2013   Procedure: INSERTION PORT-A-CATH;  Surgeon: Jina Nephew, MD;  Location: [facility] OR;  Service: General;  Laterality: Right;  Start Time: 8461.   SHOULDER ARTHROSCOPY WITH ROTATOR CUFF REPAIR AND SUBACROMIAL DECOMPRESSION Left 02/24/2014   Procedure: SHOULDER ARTHROSCOPY WITH ROTATOR CUFF REPAIR AND SUBACROMIAL DECOMPRESSION;  Surgeon: Cordella Glendia Hutchinson, MD;  Location: [facility] OR;  Service: Orthopedics;  Laterality: Left;  LEFT SHOULDER DIAGNOSTIC OPERATIVE ARTHROSCOPY, SUBACROMIAL DECOMPRESSION, ROTATOR CUFF TEAR REPAIR   SIMPLE MASTECTOMY WITH AXILLARY SENTINEL NODE BIOPSY Left 02/15/2017   Procedure: LEFT MASTECTOMY;  Surgeon: Jina Nephew, MD;  Location: [facility] OR;  Service: General;  Laterality: Left;   TOTAL MASTECTOMY Right 12/26/2018   Procedure: RIGHT BREAST PROPHYLATIC MASTECTOMY;  Surgeon: Nephew Jina, MD;  Location: [facility] OR;  Service: General;  Laterality: Right;    Family  History  Problem Relation Age of Onset   Lung cancer Father    Hypertension Father    Thyroid  cancer Father        dx in his 64s   Cancer Father        lung, thyroid , smoker   Breast cancer Paternal Aunt 30   Colon cancer Paternal Aunt        dx in her 50x   Cervical cancer Paternal Aunt        dzx in her 28s   Ovarian cancer Cousin        dx in her lage 66s   Breast cancer Cousin        maternal first cousin, once removed; dx in her late 36s   Breast cancer Cousin        maternal first cousin once removed; dx in late 52s   Hypertension Mother    Diabetes Mother    Dementia Mother    Memory loss Mother    Hypertension Brother    Seizures Brother        Alcohol induced.   Alcohol abuse  Brother        drinker, smoker   COPD Brother    Cancer Paternal Uncle        oral cancer   Kidney cancer Paternal Grandmother    Arthritis Daughter        back surgery   Cancer Cousin        several paternal cousins with brain cancer, leukemia, and other cancers   Cancer Sister        stomach    Social History   Socioeconomic History   Marital status: Married    Spouse name: Garnette   Number of children: 1   Years of education: 12   Highest education level: Not on file  Occupational History   Occupation: INVENTORY Office Manager: [employer]   Occupation: disability no longer working 2015  Tobacco Use   Smoking status: Never   Smokeless tobacco: Never  Vaping Use   Vaping status: Never Used  Substance and Sexual Activity   Alcohol use: No   Drug use: No   Sexual activity: Yes    Comment: lives with husband, disability/retirement. [employer], no dietary restrictions  Other Topics Concern   Not on file  Social History Narrative   Patient is married Wadie) and lives at home with her husband.   Patient has one daughter   Patient drinks very little caffeine.   Left handed   Social Drivers of Health   Financial Resource Strain: Low Risk   (08/02/2023)   Overall Financial Resource Strain (CARDIA)    Difficulty of Paying Living Expenses: Not hard at all  Food Insecurity: No Food Insecurity (08/02/2023)   Hunger Vital Sign    Worried About Running Out of Food in the Last Year: Never true    Ran Out of Food in the Last Year: Never true  Transportation Needs: No Transportation Needs (08/02/2023)   PRAPARE - Administrator, Civil Service (Medical): No    Lack of Transportation (Non-Medical): No  Physical Activity: Sufficiently Active (08/02/2023)   Exercise Vital Sign    Days of Exercise per Week: 7 days    Minutes of Exercise per Session: 120 min  Stress: No Stress Concern Present (08/02/2023)   [employer] of Occupational Health - Occupational Stress Questionnaire    Feeling of Stress : Not at all  Social Connections: Moderately Integrated (08/02/2023)   Social Connection and Isolation Panel [NHANES]    Frequency of Communication with Friends and Family: More than three times a week    Frequency of Social Gatherings with Friends and Family: Twice a week    Attends Religious Services: More than 4 times per year    Active Member of [employer] or Organizations: No    Attends Banker Meetings: Never    Marital Status: Married  Catering Manager Violence: Not At Risk (08/02/2023)   Humiliation, Afraid, Rape, and Kick questionnaire    Fear of Current or Ex-Partner: No    Emotionally Abused: No    Physically Abused: No    Sexually Abused: No    Outpatient Medications Prior to Visit  Medication Sig Dispense Refill   Apoaequorin (PREVAGEN) 10 MG CAPS Take 1 capsule by mouth daily.     ARIPiprazole (ABILIFY) 5 MG tablet Take 5 mg by mouth at bedtime.     aspirin  EC 325 MG tablet Take 1 tablet (325 mg total) by mouth daily. 30 tablet 0   atorvastatin  (LIPITOR) 40  MG tablet TAKE 1 TABLET BY MOUTH EVERY DAY 90 tablet 1   gabapentin  (NEURONTIN ) 300 MG capsule Take 3 capsules (900 mg total) by mouth at bedtime.  90 capsule 3   gabapentin  (NEURONTIN ) 600 MG tablet Take 1 tablet (600 mg total) by mouth 2 (two) times daily. 60 tablet 3   HYDROcodone -acetaminophen  (NORCO) 10-325 MG tablet Take 1 tablet by mouth 4 (four) times daily as needed. 120 tablet 0   hydrOXYzine  (ATARAX ) 50 MG tablet 1 ml as needed Orally twice daily     Multiple Vitamin (MULTIVITAMIN WITH MINERALS) TABS tablet Take 1 tablet by mouth daily.     traZODone  (DESYREL ) 50 MG tablet 1 tablet at bedtime as needed Orally Once a day for 30 day(s)     valsartan  (DIOVAN ) 160 MG tablet Take 1 tablet (160 mg total) by mouth daily. 90 tablet 0   anastrozole  (ARIMIDEX ) 1 MG tablet TAKE 1 TABLET BY MOUTH DAILY IN THE MORNING (Patient not taking: Reported on 12/13/2023) 90 tablet 2   Cholecalciferol 50 MCG (2000 UT) CAPS as directed Orally (Patient not taking: Reported on 12/13/2023)     No facility-administered medications prior to visit.    No Known Allergies  Review of Systems  Constitutional:  Negative for fever and malaise/fatigue.  HENT:  Negative for congestion.   Eyes:  Negative for blurred vision.  Respiratory:  Negative for shortness of breath.   Cardiovascular:  Negative for chest pain, palpitations and leg swelling.  Gastrointestinal:  Negative for abdominal pain, blood in stool and nausea.  Genitourinary:  Negative for dysuria and frequency.  Musculoskeletal:  Negative for falls.  Skin:  Negative for rash.  Neurological:  Negative for dizziness, loss of consciousness and headaches.  Endo/Heme/Allergies:  Negative for environmental allergies.  Psychiatric/Behavioral:  Negative for depression. The patient is not nervous/anxious.        Objective:    Physical Exam Constitutional:      General: She is not in acute distress.    Appearance: Normal appearance. She is well-developed. She is not toxic-appearing.  HENT:     Head: Normocephalic and atraumatic.     Right Ear: External ear normal.     Left Ear: External ear normal.      Nose: Nose normal.  Eyes:     General:        Right eye: No discharge.        Left eye: No discharge.     Conjunctiva/sclera: Conjunctivae normal.  Neck:     Thyroid : No thyromegaly.  Cardiovascular:     Rate and Rhythm: Normal rate and regular rhythm.     Heart sounds: Normal heart sounds. No murmur heard. Pulmonary:     Effort: Pulmonary effort is normal. No respiratory distress.     Breath sounds: Normal breath sounds.  Abdominal:     General: Bowel sounds are normal.     Palpations: Abdomen is soft.     Tenderness: There is no abdominal tenderness. There is no guarding.  Musculoskeletal:        General: Normal range of motion.     Cervical back: Neck supple.  Lymphadenopathy:     Cervical: No cervical adenopathy.  Skin:    General: Skin is warm and dry.  Neurological:     Mental Status: She is alert and oriented to person, place, and time.  Psychiatric:        Mood and Affect: Mood normal.        Behavior: Behavior  normal.        Thought Content: Thought content normal.        Judgment: Judgment normal.     BP 132/72 (BP Location: Right Arm, Patient Position: Sitting, Cuff Size: Normal)   Pulse 63   Temp 98.5 F (36.9 C) (Oral)   Resp 16   Ht 5' 2 (1.575 m)   Wt 167 lb 9.6 oz (76 kg)   LMP 01/20/2013   SpO2 98%   BMI 30.65 kg/m  Wt Readings from Last 3 Encounters:  12/13/23 167 lb 9.6 oz (76 kg)  11/22/23 167 lb (75.8 kg)  09/27/23 164 lb (74.4 kg)    Diabetic Foot Exam - Simple   No data filed    Lab Results  Component Value Date   WBC 6.5 12/13/2023   HGB 12.2 12/13/2023   HCT 38.0 12/13/2023   PLT 163.0 12/13/2023   GLUCOSE 92 12/13/2023   CHOL 126 12/13/2023   TRIG 72.0 12/13/2023   HDL 59.80 12/13/2023   LDLCALC 52 12/13/2023   ALT 13 12/13/2023   AST 20 12/13/2023   NA 144 12/13/2023   K 4.9 12/13/2023   CL 105 12/13/2023   CREATININE 1.08 12/13/2023   BUN 15 12/13/2023   CO2 30 12/13/2023   TSH 1.32 12/13/2023   INR 1.0  06/15/2020   HGBA1C 5.9 12/13/2023    Lab Results  Component Value Date   TSH 1.32 12/13/2023   Lab Results  Component Value Date   WBC 6.5 12/13/2023   HGB 12.2 12/13/2023   HCT 38.0 12/13/2023   MCV 89.0 12/13/2023   PLT 163.0 12/13/2023   Lab Results  Component Value Date   NA 144 12/13/2023   K 4.9 12/13/2023   CHLORIDE 104 01/22/2017   CO2 30 12/13/2023   GLUCOSE 92 12/13/2023   BUN 15 12/13/2023   CREATININE 1.08 12/13/2023   BILITOT 0.4 12/13/2023   ALKPHOS 100 12/13/2023   AST 20 12/13/2023   ALT 13 12/13/2023   PROT 7.3 12/13/2023   ALBUMIN 4.4 12/13/2023   CALCIUM  10.1 12/13/2023   ANIONGAP 11 06/21/2020   EGFR 55 (L) 02/16/2022   GFR 52.64 (L) 12/13/2023   Lab Results  Component Value Date   CHOL 126 12/13/2023   Lab Results  Component Value Date   HDL 59.80 12/13/2023   Lab Results  Component Value Date   LDLCALC 52 12/13/2023   Lab Results  Component Value Date   TRIG 72.0 12/13/2023   Lab Results  Component Value Date   CHOLHDL 2 12/13/2023   Lab Results  Component Value Date   HGBA1C 5.9 12/13/2023       Assessment & Plan:  Primary hypertension Assessment & Plan: Well controlled, no changes to meds. Encouraged heart healthy diet such as the DASH diet and exercise as tolerated.   Orders: -     Comprehensive metabolic panel -     CBC with Differential/Platelet -     TSH  Hyperglycemia Assessment & Plan: hgba1c acceptable, minimize simple carbs. Increase exercise as tolerated.   Orders: -     Hemoglobin A1c  Mixed hyperlipidemia Assessment & Plan: Encourage heart healthy diet such as MIND or DASH diet, increase exercise, avoid trans fats, simple carbohydrates and processed foods, consider a krill or fish or flaxseed oil cap daily.   Orders: -     Lipid panel    Assessment and Plan    General Health Maintenance No acute complaints. Good sleep  hygiene, hydration, and physical activity. No recent ER visits or  significant symptoms. -Continue current lifestyle modifications. -Order basic labs to check thyroid , kidney function, and blood sugar. -Plan for follow-up in the fall for a checkup.  Immunizations Up to date on COVID and flu vaccines. Discussed the benefits of tetanus and shingles vaccines, particularly in the context of injury and dementia risk, respectively. -Consider tetanus vaccine in the event of an injury. -Consider shingles vaccine to reduce risk of dementia.  Follow-up plans -Check lab results for any unexpected findings. -Return for a physical in the fall. -Consider extending checkup frequency to annually if health remains stable.         Harlene Horton, MD

## 2024-01-31 ENCOUNTER — Encounter: Payer: Self-pay | Admitting: Registered Nurse

## 2024-01-31 ENCOUNTER — Encounter: Payer: Medicare Other | Attending: Physical Medicine and Rehabilitation | Admitting: Registered Nurse

## 2024-01-31 VITALS — BP 126/74 | HR 69 | Ht 62.0 in | Wt 167.0 lb

## 2024-01-31 DIAGNOSIS — G894 Chronic pain syndrome: Secondary | ICD-10-CM | POA: Diagnosis not present

## 2024-01-31 DIAGNOSIS — T451X5A Adverse effect of antineoplastic and immunosuppressive drugs, initial encounter: Secondary | ICD-10-CM | POA: Insufficient documentation

## 2024-01-31 DIAGNOSIS — G8929 Other chronic pain: Secondary | ICD-10-CM | POA: Insufficient documentation

## 2024-01-31 DIAGNOSIS — Z79891 Long term (current) use of opiate analgesic: Secondary | ICD-10-CM | POA: Diagnosis not present

## 2024-01-31 DIAGNOSIS — M25512 Pain in left shoulder: Secondary | ICD-10-CM | POA: Insufficient documentation

## 2024-01-31 DIAGNOSIS — G620 Drug-induced polyneuropathy: Secondary | ICD-10-CM | POA: Insufficient documentation

## 2024-01-31 DIAGNOSIS — M2550 Pain in unspecified joint: Secondary | ICD-10-CM | POA: Insufficient documentation

## 2024-01-31 DIAGNOSIS — Z5181 Encounter for therapeutic drug level monitoring: Secondary | ICD-10-CM | POA: Insufficient documentation

## 2024-01-31 MED ORDER — HYDROCODONE-ACETAMINOPHEN 10-325 MG PO TABS: 1.0000 | ORAL_TABLET | Freq: Four times a day (QID) | ORAL | 0 refills | Status: DC | PRN

## 2024-01-31 NOTE — Progress Notes (Signed)
 Subjective:    Patient ID: Kristi [name], female    DOB: 12-18-1955, 68 y.o.   MRN: [id number]  HPI: Kristi [name] is a 68 y.o. female who returns for follow up appointment for chronic pain and medication refill. She states  her pain is located in her left shoulder and bilateral hands and bilateral feet with tingling and numbness. She rates her pain 5. Her current exercise regime is walking and performing stretching exercises.  [name] Morphine equivalent is 40.00 MME.   Last UDS was Performed on 11/22/2023, it was consistent.      Pain Inventory Average Pain 5 Pain Right Now 5 My pain is constant, dull, stabbing, and aching  In the last 24 hours, has pain interfered with the following? General activity 0 Relation with others 0 Enjoyment of life 0 What TIME of day is your pain at its worst? morning  Sleep (in general) Good  Pain is worse with: walking, bending, sitting, inactivity, standing, and some activites Pain improves with: medication and walking, exercise Relief from Meds: 7  Family History  Problem Relation Age of Onset   Lung cancer Father    Hypertension Father    Thyroid cancer Father        dx in his 17s   Cancer Father        lung, thyroid, smoker   Breast cancer Paternal Aunt 8   Colon cancer Paternal Aunt        dx in her 50x   Cervical cancer Paternal Aunt        dzx in her 60s   Ovarian cancer Cousin        dx in her lage 54s   Breast cancer Cousin        maternal first cousin, once removed; dx in her late 62s   Breast cancer Cousin        maternal first cousin once removed; dx in late 70s   Hypertension Mother    Diabetes Mother    Dementia Mother    Memory loss Mother    Hypertension Brother    Seizures Brother        Alcohol induced.   Alcohol abuse Brother        drinker, smoker   COPD Brother    Cancer Paternal Uncle        oral cancer   Kidney cancer Paternal Grandmother    Arthritis Daughter        back surgery   Cancer  Cousin        several paternal cousins with brain cancer, leukemia, and other cancers   Cancer Sister        stomach   Social History   Socioeconomic History   Marital status: Married    Spouse name: [name]   Number of children: 1   Years of education: 12   Highest education level: Not on file  Occupational History   Occupation: INVENTORY Office manager: [employer]   Occupation: disability no longer working 2015  Tobacco Use   Smoking status: Never   Smokeless tobacco: Never  Vaping Use   Vaping status: Never Used  Substance and Sexual Activity   Alcohol use: No   Drug use: No   Sexual activity: Yes    Comment: lives with husband, disability/retirement. [employer], no dietary restrictions  Other Topics Concern   Not on file  Social History Narrative   Patient is married [name])  and lives at home with her husband.   Patient has one daughter   Patient drinks very little caffeine.   Left handed   Social Drivers of Health   Financial Resource Strain: Low Risk  (08/02/2023)   Overall Financial Resource Strain (CARDIA)    Difficulty of Paying Living Expenses: Not hard at all  Food Insecurity: No Food Insecurity (08/02/2023)   Hunger Vital Sign    Worried About Running Out of Food in the Last Year: Never true    Ran Out of Food in the Last Year: Never true  Transportation Needs: No Transportation Needs (08/02/2023)   PRAPARE - Administrator, Civil Service (Medical): No    Lack of Transportation (Non-Medical): No  Physical Activity: Sufficiently Active (08/02/2023)   Exercise Vital Sign    Days of Exercise per Week: 7 days    Minutes of Exercise per Session: 120 min  Stress: No Stress Concern Present (08/02/2023)   [employer] of Occupational Health - Occupational Stress Questionnaire    Feeling of Stress : Not at all  Social Connections: Moderately Integrated (08/02/2023)   Social Connection and Isolation Panel [NHANES]     Frequency of Communication with Friends and Family: More than three times a week    Frequency of Social Gatherings with Friends and Family: Twice a week    Attends Religious Services: More than 4 times per year    Active Member of [employer] or Organizations: No    Attends Banker Meetings: Never    Marital Status: Married   Past Surgical History:  Procedure Laterality Date   ANAL SPHINCTEROTOMY  04/2011   APPENDECTOMY  1980   AXILLARY LYMPH NODE DISSECTION Left 02/04/2013   Procedure: LEFT AXILLARY LYMPH NODE DISSECTION;  Surgeon: Almond Lint, MD;  Location: [facility];  Service: General;  Laterality: Left;  End: 1512   BREAST LUMPECTOMY WITH NEEDLE LOCALIZATION Left 02/04/2013   Procedure: LEFT BREAST LUMPECTOMY WITH NEEDLE LOCALIZATION;  Surgeon: Almond Lint, MD;  Location: [facility];  Service: General;  Laterality: Left;   BREAST SURGERY     Lumpectomy in april 2014   HEMORRHOID SURGERY  04/2011   ligation   MASTECTOMY Left 02/15/2017   PORT-A-CATH REMOVAL N/A 04/16/2014   Procedure: REMOVAL PORT-A-CATH;  Surgeon: Almond Lint, MD;  Location: [facility];  Service: General;  Laterality: N/A;   PORTACATH PLACEMENT Right 02/04/2013   Procedure: INSERTION PORT-A-CATH;  Surgeon: Almond Lint, MD;  Location: [facility];  Service: General;  Laterality: Right;  Start Time: 1610.   SHOULDER ARTHROSCOPY WITH ROTATOR CUFF REPAIR AND SUBACROMIAL DECOMPRESSION Left 02/24/2014   Procedure: SHOULDER ARTHROSCOPY WITH ROTATOR CUFF REPAIR AND SUBACROMIAL DECOMPRESSION;  Surgeon: Cammy Copa, MD;  Location[facility];  Service: Orthopedics;  Laterality: Left;  LEFT SHOULDER DIAGNOSTIC OPERATIVE ARTHROSCOPY, SUBACROMIAL DECOMPRESSION, ROTATOR CUFF TEAR REPAIR   SIMPLE MASTECTOMY WITH AXILLARY SENTINEL NODE BIOPSY Left 02/15/2017   Procedure: LEFT MASTECTOMY;  Surgeon: Almond Lint, MD;  Location: [facility];  Service: General;  Laterality: Left;   TOTAL MASTECTOMY Right 12/26/2018   Procedure: RIGHT BREAST PROPHYLATIC  MASTECTOMY;  Surgeon: Almond Lint, MD;  Location: [facility];  Service: General;  Laterality: Right;   Past Surgical History:  Procedure Laterality Date   ANAL SPHINCTEROTOMY  04/2011   APPENDECTOMY  1980   AXILLARY LYMPH NODE DISSECTION Left 02/04/2013   Procedure: LEFT AXILLARY LYMPH NODE DISSECTION;  Surgeon: Almond Lint, MD;  Location: [facility];  Service: General;  Laterality: Left;  End: 1512   BREAST LUMPECTOMY WITH NEEDLE LOCALIZATION Left 02/04/2013   Procedure: LEFT BREAST LUMPECTOMY WITH NEEDLE LOCALIZATION;  Surgeon: Almond Lint, MD;  [facility]: [facility];  Service: General;  Laterality: Left;   BREAST SURGERY     Lumpectomy in april 2014   HEMORRHOID SURGERY  04/2011   ligation   MASTECTOMY Left 02/15/2017   PORT-A-CATH REMOVAL N/A 04/16/2014   Procedure: REMOVAL PORT-A-CATH;  Surgeon: Almond Lint, MD;  Location: [facility];  Service: General;  Laterality: N/A;   PORTACATH PLACEMENT Right 02/04/2013   Procedure: INSERTION PORT-A-CATH;  Surgeon: Almond Lint, MD;  Location: [facility];  Service: General;  Laterality: Right;  Start Time: 1610.   SHOULDER ARTHROSCOPY WITH ROTATOR CUFF REPAIR AND SUBACROMIAL DECOMPRESSION Left 02/24/2014   Procedure: SHOULDER ARTHROSCOPY WITH ROTATOR CUFF REPAIR AND SUBACROMIAL DECOMPRESSION;  Surgeon: Cammy Copa, MD;  Location[facility];  Service: Orthopedics;  Laterality: Left;  LEFT SHOULDER DIAGNOSTIC OPERATIVE ARTHROSCOPY, SUBACROMIAL DECOMPRESSION, ROTATOR CUFF TEAR REPAIR   SIMPLE MASTECTOMY WITH AXILLARY SENTINEL NODE BIOPSY Left 02/15/2017   Procedure: LEFT MASTECTOMY;  Surgeon: Almond Lint, MD;  Location: [facility];  Service: General;  Laterality: Left;   TOTAL MASTECTOMY Right 12/26/2018   Procedure: RIGHT BREAST PROPHYLATIC MASTECTOMY;  Surgeon: Almond Lint, MD;  Location: [facility];  Service: General;  Laterality: Right;   Past Medical History:  Diagnosis Date   Abnormal gait 12/17/2019   Acute blood loss anemia    Anal fissure 05/03/2011   Anal pain 04/27/2021    Anemia    Iron deficinecy anemia   Anemia 05/14/2017   Anxiety and depression 05/15/2014   Arthritis    Body mass index (BMI) 30.0-30.9, adult 12/17/2019   Breast cancer (HCC)    left ,last radiation 2'15, last chemo 8'14   Cervical cancer screening 06/18/2018   Menarche at 11 Regular and moderate flow  history of abnormal pap in past, 1 mild abnormality years ago that resolved spontaneously on recheck G1P1, s/p 1 svd history of abnormal MGM, b/o breast cancer 2014 No concerns today  gyn surgeries. Lumpectomy  LMP around early 2014   Cervical stenosis of spine 12/17/2019   Constipation 11/13/2016   Contracture of axilla 05/20/2013   Debility 01/18/2020   Essential (primary) hypertension 12/17/2019   Flushing 02/20/2014   Gait instability 12/17/2019   Genetic testing 02/20/2019   Negative genetic testing on the Comprehensive Cancer Panel.  The Comprehensive Common Cancer Panel offered by [facility] includes sequencing and/or deletion duplication testing of the following 46 genes: APC, ATM, AXIN2, BAP1, BARD1, BMPR1A, BRCA1, BRCA2, BRIP1, CDH1, CDK4, CDKN2A, CHEK2, EPCAM, FANCC, FH, FLCN, HOXB13, MET, MITF,  MLH1, MSH2, MSH6, MUTYH, NBN, NF1, NTHL1,  PALB2, PMS2, POLD1, POLE, P   H/O: CVA (cerebrovascular accident) 06/19/2020   Hereditary and idiopathic peripheral neuropathy 11/11/2013   Herpes zoster 05/15/2014   History of chicken pox    History of left breast cancer 02/15/2017   History of radiation therapy 09/09/13-10/28/13   45 gray to left breast, lumpectomy cavity boosted to 63 gray   HTN (hypertension) 11/13/2016   Hyperglycemia 01/09/2016   Hyperlipidemia 05/14/2017   Hypertension    Internal hemorrhoids without complication 05/03/2011   Knee pain, bilateral 11/14/2016   Left upper extremity numbness 06/19/2018   Major depressive disorder, recurrent episode, severe (HCC) 01/12/2014   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 Overview:   Left IDC, ER 3%, PR-, Her2neu-  Last Assessment & Plan:  Pt will continue to take her tamoxifen as previously directed. She appears to be tolerating this with only c/o moderate, occ hot flashes.   Mild neurocognitive disorder due to multiple etiologies 03/17/2020   MRSA (methicillin resistant Staphylococcus aureus) 2009   right groin area-no issues now. 04-07-14 PCR screen negative today.   Neuropathy    Neuropathy of hand, left 09/21/2015   Nodule of finger of left hand 11/14/2016   Palpitations 07/30/2020   Personal history of colonic polyps 04/27/2021   Preventative health care 11/13/2016   Right hip pain 11/14/2016   S/P mastectomy, right 12/26/2018   Sebaceous cyst 04/12/2013   Shoulder joint pain 12/12/2013   Spinal stenosis in cervical region 10/09/2019   Strain of rotator cuff 02/24/2014   Stroke (HCC) 06/2020   Vertigo    LMP 01/20/2013   Opioid Risk Score:   Fall Risk Score:  `1  Depression screen PHQ 2/9     11/22/2023    1:13 PM 08/02/2023    2:25 PM 07/24/2023   12:51 PM 06/08/2023    1:44 PM 03/29/2023    1:17 PM 02/15/2023   12:46 PM 01/04/2023    2:38 PM  Depression screen PHQ 2/9  Decreased Interest 0 0 0 0 0 0 1  Down, Depressed, Hopeless 0 0 0 0 0 0 1  PHQ - 2 Score 0 0 0 0 0 0 2  Altered sleeping    0     Tired, decreased energy    0     Change in appetite    0     Feeling bad or failure about yourself     0     Trouble concentrating    0     Moving slowly or fidgety/restless    0     Suicidal thoughts    0     PHQ-9 Score    0     Difficult doing work/chores    Not difficult at all       Review of Systems  Musculoskeletal:        Hands, feet, left shoulder  All other systems reviewed and are negative.      Objective:   Physical Exam Vitals and nursing note reviewed.  Constitutional:      Appearance: Normal appearance.  Cardiovascular:     Rate and  Rhythm: Normal rate and regular rhythm.     Pulses: Normal pulses.     Heart sounds: Normal heart sounds.  Pulmonary:     Effort: Pulmonary effort is normal.     Breath sounds: Normal breath sounds.  Musculoskeletal:     Comments: Normal Muscle Bulk and Muscle Testing Reveals:  Upper Extremities: Full ROM and Muscle Strength 5/5 Lower Extremities: Full ROM and Muscle Strength 5/5 Arises From Table with ease Narrow Based  Gait     Skin:    General: Skin is warm and dry.  Neurological:     Mental Status: She is alert and oriented to person, place, and time.  Psychiatric:        Mood and Affect: Mood normal.        Behavior: Behavior normal.         Assessment & Plan:  1. Chemotherapy Induced Peripheral Neuropathy: Continue current medication regimen with Gabapentin. Continue to Monitor. 01/31/2024 2. Chronic Pain Syndrome: Refilled: Tramadol 50 mg one tablet twice a  day for breakthrough pain #60 and  Continue Hydrocodone 10/325 mg one tablet4 times a day as needed for pain #120. We will continue the opioid monitoring program, this consists of regular clinic visits, examinations, urine drug screen, pill counts as well as use of [place] Controlled Substance Reporting system. A 12 month History has been reviewed on the [place] Controlled Substance Reporting System on 01/31/2024 3. Polyarthralgia: Continue HEP as Tolerated: Continue to Monitor. 01/31/2024 4. Chronic Left Shoulder Pain: Continue HEP as Tolerated. Continue to Monitor. 01/31/2024 F/U in 2 months

## 2024-02-14 ENCOUNTER — Encounter: Payer: Self-pay | Admitting: Family Medicine

## 2024-02-19 ENCOUNTER — Other Ambulatory Visit: Payer: Self-pay | Admitting: Emergency Medicine

## 2024-02-19 DIAGNOSIS — Z79899 Other long term (current) drug therapy: Secondary | ICD-10-CM

## 2024-03-07 ENCOUNTER — Telehealth: Payer: Self-pay | Admitting: Registered Nurse

## 2024-03-07 MED ORDER — HYDROCODONE-ACETAMINOPHEN 10-325 MG PO TABS: 1.0000 | ORAL_TABLET | Freq: Four times a day (QID) | ORAL | 0 refills | Status: DC | PRN

## 2024-03-07 NOTE — Telephone Encounter (Signed)
 PMP was Reviewed.  Hydrocodone  e-scribed to pharmacy.  Kristi [name] is aware via My-Chart

## 2024-03-09 ENCOUNTER — Other Ambulatory Visit: Payer: Self-pay | Admitting: Family Medicine

## 2024-03-09 DIAGNOSIS — I10 Essential (primary) hypertension: Secondary | ICD-10-CM

## 2024-03-15 ENCOUNTER — Encounter: Payer: Self-pay | Admitting: *Deleted

## 2024-03-15 ENCOUNTER — Ambulatory Visit
Admission: EM | Admit: 2024-03-15 | Discharge: 2024-03-15 | Disposition: A | Discharge status: Home / Self Care | Attending: Emergency Medicine | Admitting: Emergency Medicine

## 2024-03-15 DIAGNOSIS — H6123 Impacted cerumen, bilateral: Secondary | ICD-10-CM

## 2024-03-15 NOTE — Discharge Instructions (Signed)
Today you were treated for ear fullness due to buildup of wax, ears have been irrigated with water  Moving forward you may use over-the-counter Debrox drops to help thin secretions making it easier to clean   may follow-up with his urgent care as needed if fullness recurs

## 2024-03-15 NOTE — ED Triage Notes (Signed)
 Patient states 2 weeks of bilateral ear fullness and sinus pressure, no cough or fever.  No OTC meds for symptoms

## 2024-03-16 NOTE — ED Provider Notes (Signed)
 Arlander Bellman    CSN: [id number] Arrival date & time: 03/15/24  1457      History   Chief Complaint Chief Complaint  Patient presents with   sinus pressure   Ear Fullness    HPI Kristi [name] is a 68 y.o. female.   Patient presents for evaluation of bilateral ear fullness present for 2 week.  Right side worse than left.  Associated muffled hearing.  Has attempted use of over-the-counter eardrops, peroxide and cleaning of the ears.  Denies fever or URI symptoms.  Past Medical History:  Diagnosis Date   Abnormal gait 12/17/2019   Acute blood loss anemia    Anal fissure 05/03/2011   Anal pain 04/27/2021   Anemia    Iron deficinecy anemia   Anemia 05/14/2017   Anxiety and depression 05/15/2014   Arthritis    Body mass index (BMI) 30.0-30.9, adult 12/17/2019   Breast cancer (HCC)    left ,last radiation 2'15, last chemo 8'14   Cervical cancer screening 06/18/2018   Menarche at 11 Regular and moderate flow  history of abnormal pap in past, 1 mild abnormality years ago that resolved spontaneously on recheck G1P1, s/p 1 svd history of abnormal MGM, b/o breast cancer 2014 No concerns today  gyn surgeries. Lumpectomy  LMP around early 2014   Cervical stenosis of spine 12/17/2019   Constipation 11/13/2016   Contracture of axilla 05/20/2013   Debility 01/18/2020   Essential (primary) hypertension 12/17/2019   Flushing 02/20/2014   Gait instability 12/17/2019   Genetic testing 02/20/2019   Negative genetic testing on the Comprehensive Cancer Panel.  The Comprehensive Common Cancer Panel offered by [facility] includes sequencing and/or deletion duplication testing of the following 46 genes: APC, ATM, AXIN2, BAP1, BARD1, BMPR1A, BRCA1, BRCA2, BRIP1, CDH1, CDK4, CDKN2A, CHEK2, EPCAM, FANCC, FH, FLCN, HOXB13, MET, MITF,  MLH1, MSH2, MSH6, MUTYH, NBN, NF1, NTHL1,  PALB2, PMS2, POLD1, POLE, P   H/O: CVA (cerebrovascular accident) 06/19/2020   Hereditary and idiopathic peripheral  neuropathy 11/11/2013   Herpes zoster 05/15/2014   History of chicken pox    History of left breast cancer 02/15/2017   History of radiation therapy 09/09/13-10/28/13   45 gray to left breast, lumpectomy cavity boosted to 63 gray   HTN (hypertension) 11/13/2016   Hyperglycemia 01/09/2016   Hyperlipidemia 05/14/2017   Hypertension    Internal hemorrhoids without complication 05/03/2011   Knee pain, bilateral 11/14/2016   Left upper extremity numbness 06/19/2018   Major depressive disorder, recurrent episode, severe (HCC) 01/12/2014   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 Overview:   Left IDC, ER 3%, PR-, Her2neu-  Last Assessment & Plan:  Pt will continue to take her tamoxifen  as previously directed. She appears to be tolerating this with only c/o moderate, occ hot flashes.   Mild neurocognitive disorder due to multiple etiologies 03/17/2020   MRSA (methicillin resistant Staphylococcus aureus) 2009   right groin area-no issues now. 04-07-14 PCR screen negative today.   Neuropathy    Neuropathy of hand, left 09/21/2015   Nodule of finger of left hand 11/14/2016   Palpitations 07/30/2020   Personal history of colonic polyps 04/27/2021   Preventative health care 11/13/2016   Right hip pain 11/14/2016   S/P mastectomy, right 12/26/2018   Sebaceous cyst 04/12/2013   Shoulder joint pain 12/12/2013   Spinal stenosis in  cervical region 10/09/2019   Strain of rotator cuff 02/24/2014   Stroke (HCC) 06/2020   Vertigo     Patient Active Problem List   Diagnosis Date Noted   Benign paroxysmal positional vertigo of left ear 06/08/2023   Lymphedema 06/08/2023   Atherosclerotic vascular disease 10/05/2021   History of colonic polyps 04/27/2021   Anal pain 04/27/2021   Arthritis    Breast cancer (HCC)    History of radiation therapy    Neuropathy     Palpitations 07/30/2020   H/O: CVA (cerebrovascular accident) 06/19/2020   Vertigo    Acute blood loss anemia    History of stroke 06/2020   Mild neurocognitive disorder due to multiple etiologies 03/17/2020   Debility 01/18/2020   Abnormal gait 12/17/2019   Gait instability 12/17/2019   Body mass index (BMI) 30.0-30.9, adult 12/17/2019   Cervical stenosis of spine 12/17/2019   Spinal stenosis in cervical region 10/09/2019   Genetic testing 02/20/2019   S/P mastectomy, right 12/26/2018   Left upper extremity numbness 06/19/2018   Cervical cancer screening 06/18/2018   Anemia 05/14/2017   Hyperlipidemia 05/14/2017   History of left breast cancer 02/15/2017   Nodule of finger of left hand 11/14/2016   Right hip pain 11/14/2016   Knee pain, bilateral 11/14/2016   Constipation 11/13/2016   Preventative health care 11/13/2016   HTN (hypertension) 11/13/2016   Hyperglycemia 01/09/2016   Neuropathy of hand, left 09/21/2015   History of chicken pox    Herpes zoster 05/15/2014   Anxiety and depression 05/15/2014   Strain of rotator cuff 02/24/2014   Flushing 02/20/2014   Major depressive disorder, recurrent episode, severe (HCC) 01/12/2014   Shoulder joint pain 12/12/2013   Hereditary and idiopathic peripheral neuropathy 11/11/2013   Contracture of axilla 05/20/2013   Sebaceous cyst 04/12/2013   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) 01/24/2013   Anal fissure 05/03/2011   Internal hemorrhoids without complication 05/03/2011   MRSA (methicillin resistant Staphylococcus aureus) 2009    Past Surgical History:  Procedure Laterality Date   ANAL SPHINCTEROTOMY  04/2011   APPENDECTOMY  1980   AXILLARY LYMPH NODE DISSECTION Left 02/04/2013   Procedure: LEFT AXILLARY LYMPH NODE DISSECTION;  Surgeon: Lockie Rima, MD;  Location: [facility] OR;  Service: General;  Laterality: Left;  End: 1512   BREAST LUMPECTOMY WITH NEEDLE LOCALIZATION Left 02/04/2013   Procedure: LEFT  BREAST LUMPECTOMY WITH NEEDLE LOCALIZATION;  Surgeon: Lockie Rima, MD;  Location: [facility] OR;  Service: General;  Laterality: Left;   BREAST SURGERY     Lumpectomy in april 2014   HEMORRHOID SURGERY  04/2011   ligation   MASTECTOMY Left 02/15/2017   PORT-A-CATH REMOVAL N/A 04/16/2014   Procedure: REMOVAL PORT-A-CATH;  Surgeon: Lockie Rima, MD;  Location: [facility];  Service: General;  Laterality: N/A;   PORTACATH PLACEMENT Right 02/04/2013   Procedure: INSERTION PORT-A-CATH;  Surgeon: Lockie Rima, MD;  Location: [facility] OR;  Service: General;  Laterality: Right;  Start Time: 1538.   SHOULDER ARTHROSCOPY WITH ROTATOR CUFF REPAIR AND SUBACROMIAL DECOMPRESSION Left 02/24/2014   Procedure: SHOULDER ARTHROSCOPY WITH ROTATOR CUFF REPAIR AND SUBACROMIAL DECOMPRESSION;  Surgeon: Jasmine Mesi, MD;  Location: [facility];  Service: Orthopedics;  Laterality: Left;  LEFT SHOULDER DIAGNOSTIC OPERATIVE ARTHROSCOPY, SUBACROMIAL DECOMPRESSION, ROTATOR CUFF TEAR REPAIR   SIMPLE MASTECTOMY WITH AXILLARY SENTINEL NODE BIOPSY Left 02/15/2017   Procedure: LEFT MASTECTOMY;  Surgeon: Lockie Rima, MD;  Location: [facility] OR;  Service: General;  Laterality: Left;  TOTAL MASTECTOMY Right 12/26/2018   Procedure: RIGHT BREAST PROPHYLATIC MASTECTOMY;  Surgeon: Lockie Rima, MD;  Location: [facility] OR;  Service: General;  Laterality: Right;    OB History   No obstetric history on file.      Home Medications    Prior to Admission medications   Medication Sig Start Date End Date Taking? Authorizing Provider  anastrozole  (ARIMIDEX ) 1 MG tablet TAKE 1 TABLET BY MOUTH DAILY IN THE MORNING Patient not taking: Reported on 01/31/2024 08/17/22   Gudena, Vinay, MD  Apoaequorin (PREVAGEN) 10 MG CAPS Take 1 capsule by mouth daily. 05/21/20   Gudena, Vinay, MD  ARIPiprazole (ABILIFY) 5 MG tablet Take 5 mg by mouth at bedtime. 01/04/22   [provider]  aspirin  EC 325 MG tablet Take 1 tablet (325 mg total) by mouth daily. 05/20/21   Gudena, Vinay,  MD  atorvastatin  (LIPITOR) 40 MG tablet TAKE 1 TABLET BY MOUTH EVERY DAY 11/21/23   Neda Balk, MD  Cholecalciferol 50 MCG (2000 UT) CAPS as directed Orally Patient not taking: Reported on 01/31/2024    [provider]  gabapentin  (NEURONTIN ) 300 MG capsule Take 3 capsules (900 mg total) by mouth at bedtime. 11/22/23   Jodi Munroe, NP  gabapentin  (NEURONTIN ) 600 MG tablet Take 1 tablet (600 mg total) by mouth 2 (two) times daily. 11/22/23   Jodi Munroe, NP  HYDROcodone -acetaminophen  (NORCO) 10-325 MG tablet Take 1 tablet by mouth 4 (four) times daily as needed. 03/07/24   Jodi Munroe, NP  hydrOXYzine  (ATARAX ) 50 MG tablet 1 ml as needed Orally twice daily    [provider]  Multiple Vitamin (MULTIVITAMIN WITH MINERALS) TABS tablet Take 1 tablet by mouth daily.    [provider]  traZODone  (DESYREL ) 50 MG tablet 1 tablet at bedtime as needed Orally Once a day for 30 day(s)    [provider]  valsartan  (DIOVAN ) 160 MG tablet TAKE 1 TABLET BY MOUTH EVERY DAY 03/10/24   Neda Balk, MD    Family History Family History  Problem Relation Age of Onset   Lung cancer Father    Hypertension Father    Thyroid  cancer Father        dx in his 35s   Cancer Father        lung, thyroid , smoker   Breast cancer Paternal Aunt 63   Colon cancer Paternal Aunt        dx in her 50x   Cervical cancer Paternal Aunt        dzx in her 70s   Ovarian cancer Cousin        dx in her lage 7s   Breast cancer Cousin        maternal first cousin, once removed; dx in her late 51s   Breast cancer Cousin        maternal first cousin once removed; dx in late 7s   Hypertension Mother    Diabetes Mother    Dementia Mother    Memory loss Mother    Hypertension Brother    Seizures Brother        Alcohol induced.   Alcohol abuse Brother        drinker, smoker   COPD Brother    Cancer Paternal Uncle        oral cancer   Kidney cancer Paternal Grandmother     Arthritis Daughter        back surgery   Cancer Cousin  several paternal cousins with brain cancer, leukemia, and other cancers   Cancer Sister        stomach    Social History Social History   Tobacco Use   Smoking status: Never   Smokeless tobacco: Never  Vaping Use   Vaping status: Never Used  Substance Use Topics   Alcohol use: No   Drug use: No     Allergies   Patient has no known allergies.   Review of Systems Review of Systems   Physical Exam Triage Vital Signs ED Triage Vitals  Encounter Vitals Group     BP 03/15/24 1519 (!) 147/74     Systolic BP Percentile --      Diastolic BP Percentile --      Pulse Rate 03/15/24 1519 60     Resp 03/15/24 1519 18     Temp 03/15/24 1519 98 F (36.7 C)     Temp Source 03/15/24 1519 Oral     SpO2 03/15/24 1519 95 %     Weight 03/15/24 1516 167 lb (75.8 kg)     Height 03/15/24 1516 5' (1.524 m)     Head Circumference --      Peak Flow --      Pain Score 03/15/24 1516 0     Pain Loc --      Pain Education --      Exclude from Growth Chart --    No data found.  Updated Vital Signs BP (!) 147/74 (BP Location: Right Arm) Comment: checked twice  Pulse 60   Temp 98 F (36.7 C) (Oral)   Resp 18   Ht 5' (1.524 m)   Wt 167 lb (75.8 kg)   LMP 01/20/2013   SpO2 95%   BMI 32.61 kg/m   Visual Acuity Right Eye Distance:   Left Eye Distance:   Bilateral Distance:    Right Eye Near:   Left Eye Near:    Bilateral Near:     Physical Exam Constitutional:      Appearance: Normal appearance.  HENT:     Right Ear: There is impacted cerumen.     Left Ear: There is impacted cerumen.  Eyes:     Extraocular Movements: Extraocular movements intact.  Pulmonary:     Effort: Pulmonary effort is normal.  Neurological:     Mental Status: She is alert and oriented to person, place, and time.      UC Treatments / Results  Labs (all labs ordered are listed, but only abnormal results are displayed) Labs  Reviewed - No data to display  EKG   Radiology No results found.  Procedures Procedures (including critical care time)  Medications Ordered in UC Medications - No data to display  Initial Impression / Assessment and Plan / UC Course  I have reviewed the triage vital signs and the nursing notes.  Pertinent labs & imaging results that were available during my care of the patient were reviewed by me and considered in my medical decision making (see chart for details).  Bilateral Impacted cerumen  Present on exam, water irrigation completed by nursing staff, successful, no signs of infection on reevaluation, advise moving for use of over-the-counter Debrox drops, advised follow-up as needed Final Clinical Impressions(s) / UC Diagnoses   Final diagnoses:  Bilateral impacted cerumen   Discharge Instructions      Today you were treated for ear fullness due to buildup of wax, ears have been irrigated with water  Moving forward you  may use over-the-counter Debrox drops to help thin secretions making it easier to clean   may follow-up with his urgent care as needed if fullness recurs   ED Prescriptions   None    PDMP not reviewed this encounter.   Reena Canning, NP 03/16/24 [phone]

## 2024-03-26 NOTE — Progress Notes (Unsigned)
 Subjective:    Patient ID: Kristi [name], female    DOB: Apr 03, 1956, 68 y.o.   MRN: [id number]  HPI: Kristi [name] is a 68 y.o. female who returns for follow up appointment for chronic pain and medication refill. She states her pain is located in her . Left shoulder, bilateral hands and bilateral feet with tingling and burning. She rates her pain 5. Her current exercise regime is walking and performing stretching exercises.  [name] arrived bradycardic, medication list was reviewed. Apical pulse was checked and she will F/U with her PCP, she verbalizes understanding.   [name] Morphine  equivalent is 40.00 MME.   UDS was Performed on 03/27/2024.     Pain Inventory Average Pain 5 Pain Right Now 5 My pain is sharp, burning, stabbing, tingling, and aching  In the last 24 hours, has pain interfered with the following? General activity 5 Relation with others 4 Enjoyment of life 3 What TIME of day is your pain at its worst? daytime, evening, and night Sleep (in general) Good  Pain is worse with: walking, sitting, inactivity, and standing Pain improves with: therapy/exercise and medication Relief from Meds: 7  Family History  Problem Relation Age of Onset   Lung cancer Father    Hypertension Father    Thyroid  cancer Father        dx in his 41s   Cancer Father        lung, thyroid , smoker   Breast cancer Paternal Aunt 58   Colon cancer Paternal Aunt        dx in her 50x   Cervical cancer Paternal Aunt        dzx in her 67s   Ovarian cancer Cousin        dx in her lage 10s   Breast cancer Cousin        maternal first cousin, once removed; dx in her late 77s   Breast cancer Cousin        maternal first cousin once removed; dx in late 31s   Hypertension Mother    Diabetes Mother    Dementia Mother    Memory loss Mother    Hypertension Brother    Seizures Brother        Alcohol induced.   Alcohol abuse Brother        drinker, smoker   COPD Brother    Cancer  Paternal Uncle        oral cancer   Kidney cancer Paternal Grandmother    Arthritis Daughter        back surgery   Cancer Cousin        several paternal cousins with brain cancer, leukemia, and other cancers   Cancer Sister        stomach   Social History   Socioeconomic History   Marital status: Married    Spouse name: Kristi [name]   Number of children: 1   Years of education: 12   Highest education level: Not on file  Occupational History   Occupation: INVENTORY Office manager: [employer]   Occupation: disability no longer working 2015  Tobacco Use   Smoking status: Never   Smokeless tobacco: Never  Vaping Use   Vaping status: Never Used  Substance and Sexual Activity   Alcohol use: No   Drug use: No   Sexual activity: Yes    Comment: lives with husband, disability/retirement. [employer], no dietary restrictions  Other  Topics Concern   Not on file  Social History Narrative   Patient is married Kristi [name]) and lives at home with her husband.   Patient has one daughter   Patient drinks very little caffeine.   Left handed   Social Drivers of Health   Financial Resource Strain: Low Risk  (08/02/2023)   Overall Financial Resource Strain (CARDIA)    Difficulty of Paying Living Expenses: Not hard at all  Food Insecurity: No Food Insecurity (08/02/2023)   Hunger Vital Sign    Worried About Running Out of Food in the Last Year: Never true    Ran Out of Food in the Last Year: Never true  Transportation Needs: No Transportation Needs (08/02/2023)   PRAPARE - Administrator, Civil Service (Medical): No    Lack of Transportation (Non-Medical): No  Physical Activity: Sufficiently Active (08/02/2023)   Exercise Vital Sign    Days of Exercise per Week: 7 days    Minutes of Exercise per Session: 120 min  Stress: No Stress Concern Present (08/02/2023)   [employer] of Occupational Health - Occupational Stress Questionnaire    Feeling of Stress  : Not at all  Social Connections: Moderately Integrated (08/02/2023)   Social Connection and Isolation Panel [NHANES]    Frequency of Communication with Friends and Family: More than three times a week    Frequency of Social Gatherings with Friends and Family: Twice a week    Attends Religious Services: More than 4 times per year    Active Member of [employer] or Organizations: No    Attends Banker Meetings: Never    Marital Status: Married   Past Surgical History:  Procedure Laterality Date   ANAL SPHINCTEROTOMY  04/2011   APPENDECTOMY  1980   AXILLARY LYMPH NODE DISSECTION Left 02/04/2013   Procedure: LEFT AXILLARY LYMPH NODE DISSECTION;  Surgeon: Lockie Rima, MD;  Location: [facility] OR;  Service: General;  Laterality: Left;  End: 1512   BREAST LUMPECTOMY WITH NEEDLE LOCALIZATION Left 02/04/2013   Procedure: LEFT BREAST LUMPECTOMY WITH NEEDLE LOCALIZATION;  Surgeon: Lockie Rima, MD;  Location: [facility] OR;  Service: General;  Laterality: Left;   BREAST SURGERY     Lumpectomy in april 2014   HEMORRHOID SURGERY  04/2011   ligation   MASTECTOMY Left 02/15/2017   PORT-A-CATH REMOVAL N/A 04/16/2014   Procedure: REMOVAL PORT-A-CATH;  Surgeon: Lockie Rima, MD;  Location: [facility];  Service: General;  Laterality: N/A;   PORTACATH PLACEMENT Right 02/04/2013   Procedure: INSERTION PORT-A-CATH;  Surgeon: Lockie Rima, MD;  Location: [facility];  Service: General;  Laterality: Right;  Start Time: 9811.   SHOULDER ARTHROSCOPY WITH ROTATOR CUFF REPAIR AND SUBACROMIAL DECOMPRESSION Left 02/24/2014   Procedure: SHOULDER ARTHROSCOPY WITH ROTATOR CUFF REPAIR AND SUBACROMIAL DECOMPRESSION;  Surgeon: Jasmine Mesi, MD;  Location: [facility] OR;  Service: Orthopedics;  Laterality: Left;  LEFT SHOULDER DIAGNOSTIC OPERATIVE ARTHROSCOPY, SUBACROMIAL DECOMPRESSION, ROTATOR CUFF TEAR REPAIR   SIMPLE MASTECTOMY WITH AXILLARY SENTINEL NODE BIOPSY Left 02/15/2017   Procedure: LEFT MASTECTOMY;  Surgeon: Lockie Rima, MD;  Location: [facility];  Service: General;  Laterality: Left;   TOTAL MASTECTOMY Right 12/26/2018   Procedure: RIGHT BREAST PROPHYLATIC MASTECTOMY;  Surgeon: Lockie Rima, MD;  Location: [facility];  Service: General;  Laterality: Right;   Past Surgical History:  Procedure Laterality Date   ANAL SPHINCTEROTOMY  04/2011   APPENDECTOMY  1980   AXILLARY LYMPH NODE DISSECTION Left 02/04/2013   Procedure: LEFT AXILLARY LYMPH  NODE DISSECTION;  Surgeon: Lockie Rima, MD;  Location: [facility];  Service: General;  Laterality: Left;  End: 1512   BREAST LUMPECTOMY WITH NEEDLE LOCALIZATION Left 02/04/2013   Procedure: LEFT BREAST LUMPECTOMY WITH NEEDLE LOCALIZATION;  Surgeon: Lockie Rima, MD;  Location: [facility];  Service: General;  Laterality: Left;   BREAST SURGERY     Lumpectomy in april 2014   HEMORRHOID SURGERY  04/2011   ligation   MASTECTOMY Left 02/15/2017   PORT-A-CATH REMOVAL N/A 04/16/2014   Procedure: REMOVAL PORT-A-CATH;  Surgeon: Lockie Rima, MD;  Location: [facility];  Service: General;  Laterality: N/A;   PORTACATH PLACEMENT Right 02/04/2013   Procedure: INSERTION PORT-A-CATH;  Surgeon: Lockie Rima, MD;  Location: [facility];  Service: General;  Laterality: Right;  Start Time: 4098.   SHOULDER ARTHROSCOPY WITH ROTATOR CUFF REPAIR AND SUBACROMIAL DECOMPRESSION Left 02/24/2014   Procedure: SHOULDER ARTHROSCOPY WITH ROTATOR CUFF REPAIR AND SUBACROMIAL DECOMPRESSION;  Surgeon: Jasmine Mesi, MD;  Location: [facility] OR;  Service: Orthopedics;  Laterality: Left;  LEFT SHOULDER DIAGNOSTIC OPERATIVE ARTHROSCOPY, SUBACROMIAL DECOMPRESSION, ROTATOR CUFF TEAR REPAIR   SIMPLE MASTECTOMY WITH AXILLARY SENTINEL NODE BIOPSY Left 02/15/2017   Procedure: LEFT MASTECTOMY;  Surgeon: Lockie Rima, MD;  Location: [facility];  Service: General;  Laterality: Left;   TOTAL MASTECTOMY Right 12/26/2018   Procedure: RIGHT BREAST PROPHYLATIC MASTECTOMY;  Surgeon: Lockie Rima, MD;  Location: [facility];  Service: General;  Laterality: Right;   Past Medical History:   Diagnosis Date   Abnormal gait 12/17/2019   Acute blood loss anemia    Anal fissure 05/03/2011   Anal pain 04/27/2021   Anemia    Iron deficinecy anemia   Anemia 05/14/2017   Anxiety and depression 05/15/2014   Arthritis    Body mass index (BMI) 30.0-30.9, adult 12/17/2019   Breast cancer (HCC)    left ,last radiation 2'15, last chemo 8'14   Cervical cancer screening 06/18/2018   Menarche at 11 Regular and moderate flow  history of abnormal pap in past, 1 mild abnormality years ago that resolved spontaneously on recheck G1P1, s/p 1 svd history of abnormal MGM, b/o breast cancer 2014 No concerns today  gyn surgeries. Lumpectomy  LMP around early 2014   Cervical stenosis of spine 12/17/2019   Constipation 11/13/2016   Contracture of axilla 05/20/2013   Debility 01/18/2020   Essential (primary) hypertension 12/17/2019   Flushing 02/20/2014   Gait instability 12/17/2019   Genetic testing 02/20/2019   Negative genetic testing on the Comprehensive Cancer Panel.  The Comprehensive Common Cancer Panel offered by [facility] includes sequencing and/or deletion duplication testing of the following 46 genes: APC, ATM, AXIN2, BAP1, BARD1, BMPR1A, BRCA1, BRCA2, BRIP1, CDH1, CDK4, CDKN2A, CHEK2, EPCAM, FANCC, FH, FLCN, HOXB13, MET, MITF,  MLH1, MSH2, MSH6, MUTYH, NBN, NF1, NTHL1,  PALB2, PMS2, POLD1, POLE, P   H/O: CVA (cerebrovascular accident) 06/19/2020   Hereditary and idiopathic peripheral neuropathy 11/11/2013   Herpes zoster 05/15/2014   History of chicken pox    History of left breast cancer 02/15/2017   History of radiation therapy 09/09/13-10/28/13   45 gray to left breast, lumpectomy cavity boosted to 63 gray   HTN (hypertension) 11/13/2016   Hyperglycemia 01/09/2016   Hyperlipidemia 05/14/2017   Hypertension    Internal hemorrhoids without complication 05/03/2011   Knee pain, bilateral 11/14/2016   Left upper extremity numbness 06/19/2018   Major depressive disorder, recurrent episode,  severe (HCC) 01/12/2014   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 Overview:   Left IDC, ER 3%, PR-, Her2neu-  Last Assessment & Plan:  Pt will continue to take her tamoxifen  as previously directed. She appears to be tolerating this with only c/o moderate, occ hot flashes.   Mild neurocognitive disorder due to multiple etiologies 03/17/2020   MRSA (methicillin resistant Staphylococcus aureus) 2009   right groin area-no issues now. 04-07-14 PCR screen negative today.   Neuropathy    Neuropathy of hand, left 09/21/2015   Nodule of finger of left hand 11/14/2016   Palpitations 07/30/2020   Personal history of colonic polyps 04/27/2021   Preventative health care 11/13/2016   Right hip pain 11/14/2016   S/P mastectomy, right 12/26/2018   Sebaceous cyst 04/12/2013   Shoulder joint pain 12/12/2013   Spinal stenosis in cervical region 10/09/2019   Strain of rotator cuff 02/24/2014   Stroke (HCC) 06/2020   Vertigo    BP 136/79   Pulse (!) 59   Ht 5' (1.524 m)   Wt 167 lb 9.6 oz (76 kg)   LMP 01/20/2013   SpO2 98%   BMI 32.73 kg/m   Opioid Risk Score:   Fall Risk Score:  `1  Depression screen PHQ 2/9     03/27/2024    1:22 PM 01/31/2024   12:59 PM 11/22/2023    1:13 PM 08/02/2023    2:25 PM 07/24/2023   12:51 PM 06/08/2023    1:44 PM 03/29/2023    1:17 PM  Depression screen PHQ 2/9  Decreased Interest 0 0 0 0 0 0 0  Down, Depressed, Hopeless 0 0 0 0 0 0 0  PHQ - 2 Score 0 0 0 0 0 0 0  Altered sleeping      0   Tired, decreased energy      0   Change in appetite      0   Feeling bad or failure about yourself       0   Trouble concentrating      0   Moving slowly or fidgety/restless      0   Suicidal thoughts      0   PHQ-9 Score      0   Difficult doing work/chores      Not difficult at all     Review of Systems  Musculoskeletal:        Left  shoulder bil hand bil feet  All other systems reviewed and are negative.      Objective:   Physical Exam Vitals and nursing note reviewed.  Constitutional:      Appearance: Normal appearance.  Cardiovascular:     Rate and Rhythm: Bradycardia present.     Pulses: Normal pulses.     Heart sounds: Normal heart sounds.  Pulmonary:     Effort: Pulmonary effort is normal.     Breath sounds: Normal breath sounds.  Musculoskeletal:     Comments: Normal Muscle Bulk and Muscle Testing Reveals:  Upper Extremities: Full  ROM and Muscle Strength 5/5 Lower Extremities: Lower Extremities: Full ROM and Muscle Strength 5/5 Arises from Table with ease Narrow Based Gait     Skin:    General: Skin is warm and dry.  Neurological:     Mental Status: She is alert and oriented to person, place, and time.  Psychiatric:        Mood and  Affect: Mood normal.        Behavior: Behavior normal.         Assessment & Plan:  1. Chemotherapy Induced Peripheral Neuropathy: Continue current medication regimen with Gabapentin . Continue to Monitor. 03/27/2024 2. Chronic Pain Syndrome: Continue  Tramadol  50 mg one tablet twice a day for breakthrough pain #60 and  Refilled:  Hydrocodone  10/325 mg one tablet4 times a day as needed for pain #120. We will continue the opioid monitoring program, this consists of regular clinic visits, examinations, urine drug screen, pill counts as well as use of [place]  Controlled Substance Reporting system. A 12 month History has been reviewed on the [place]  Controlled Substance Reporting System on 03/27/2024 3. Polyarthralgia: Continue HEP as Tolerated: Continue to Monitor. 03/27/2024 4. Chronic Left Shoulder Pain: Continue HEP as Tolerated. Continue to Monitor. 03/27/2024 5. Bradycardia: Apical Pulse checked: She will F/U with Her PCP , she verbalizes understanding.  F/U in 2 months

## 2024-03-27 ENCOUNTER — Encounter: Attending: Physical Medicine and Rehabilitation | Admitting: Registered Nurse

## 2024-03-27 ENCOUNTER — Encounter: Payer: Self-pay | Admitting: Registered Nurse

## 2024-03-27 VITALS — BP 136/79 | HR 58 | Ht 60.0 in | Wt 167.6 lb

## 2024-03-27 DIAGNOSIS — Z5181 Encounter for therapeutic drug level monitoring: Secondary | ICD-10-CM

## 2024-03-27 DIAGNOSIS — G620 Drug-induced polyneuropathy: Secondary | ICD-10-CM | POA: Diagnosis not present

## 2024-03-27 DIAGNOSIS — Z79891 Long term (current) use of opiate analgesic: Secondary | ICD-10-CM | POA: Diagnosis not present

## 2024-03-27 DIAGNOSIS — M25512 Pain in left shoulder: Secondary | ICD-10-CM

## 2024-03-27 DIAGNOSIS — G894 Chronic pain syndrome: Secondary | ICD-10-CM | POA: Diagnosis not present

## 2024-03-27 DIAGNOSIS — T451X5A Adverse effect of antineoplastic and immunosuppressive drugs, initial encounter: Secondary | ICD-10-CM

## 2024-03-27 DIAGNOSIS — G8929 Other chronic pain: Secondary | ICD-10-CM | POA: Insufficient documentation

## 2024-03-27 DIAGNOSIS — R001 Bradycardia, unspecified: Secondary | ICD-10-CM | POA: Diagnosis not present

## 2024-03-27 MED ORDER — GABAPENTIN 300 MG PO CAPS: 900.0000 mg | ORAL_CAPSULE | Freq: Every day | ORAL | 3 refills | Status: DC

## 2024-03-27 MED ORDER — GABAPENTIN 600 MG PO TABS: 600.0000 mg | ORAL_TABLET | Freq: Two times a day (BID) | ORAL | 3 refills | Status: DC

## 2024-03-27 MED ORDER — HYDROCODONE-ACETAMINOPHEN 10-325 MG PO TABS: 1.0000 | ORAL_TABLET | Freq: Four times a day (QID) | ORAL | 0 refills | Status: DC | PRN

## 2024-03-31 LAB — TOXASSURE SELECT,+ANTIDEPR,UR

## 2024-05-03 ENCOUNTER — Other Ambulatory Visit: Payer: Self-pay | Admitting: Family Medicine

## 2024-05-29 ENCOUNTER — Telehealth: Payer: Self-pay | Admitting: Registered Nurse

## 2024-05-29 ENCOUNTER — Encounter: Admitting: Registered Nurse

## 2024-05-29 MED ORDER — HYDROCODONE-ACETAMINOPHEN 10-325 MG PO TABS
1.0000 | ORAL_TABLET | Freq: Four times a day (QID) | ORAL | 0 refills | Status: DC | PRN
Start: 2024-05-29 — End: 2024-07-03

## 2024-05-29 NOTE — Telephone Encounter (Signed)
 PMP was Reviewed.  Hydrocodone  e-scribed to pharmacy.  Kristi [name]  was called this morning regarding the above, she verbalizes understanding.

## 2024-06-05 ENCOUNTER — Other Ambulatory Visit: Payer: Self-pay | Admitting: Family Medicine

## 2024-07-03 ENCOUNTER — Encounter: Attending: Registered Nurse | Admitting: Registered Nurse

## 2024-07-03 VITALS — BP 137/77 | HR 62 | Ht 60.0 in | Wt 173.0 lb

## 2024-07-03 DIAGNOSIS — Z5181 Encounter for therapeutic drug level monitoring: Secondary | ICD-10-CM | POA: Insufficient documentation

## 2024-07-03 DIAGNOSIS — G629 Polyneuropathy, unspecified: Secondary | ICD-10-CM | POA: Diagnosis not present

## 2024-07-03 DIAGNOSIS — G620 Drug-induced polyneuropathy: Secondary | ICD-10-CM | POA: Diagnosis not present

## 2024-07-03 DIAGNOSIS — M25512 Pain in left shoulder: Secondary | ICD-10-CM | POA: Insufficient documentation

## 2024-07-03 DIAGNOSIS — Z79891 Long term (current) use of opiate analgesic: Secondary | ICD-10-CM | POA: Diagnosis not present

## 2024-07-03 DIAGNOSIS — G894 Chronic pain syndrome: Secondary | ICD-10-CM | POA: Diagnosis not present

## 2024-07-03 DIAGNOSIS — G8929 Other chronic pain: Secondary | ICD-10-CM | POA: Diagnosis not present

## 2024-07-03 DIAGNOSIS — T451X5A Adverse effect of antineoplastic and immunosuppressive drugs, initial encounter: Secondary | ICD-10-CM | POA: Insufficient documentation

## 2024-07-03 MED ORDER — TRAMADOL HCL 50 MG PO TABS: 50.0000 mg | ORAL_TABLET | Freq: Two times a day (BID) | ORAL | Status: DC | PRN

## 2024-07-03 MED ORDER — GABAPENTIN 300 MG PO CAPS: 900.0000 mg | ORAL_CAPSULE | Freq: Every day | ORAL | 3 refills | Status: DC

## 2024-07-03 MED ORDER — TRAMADOL HCL 50 MG PO TABS: 50.0000 mg | ORAL_TABLET | Freq: Two times a day (BID) | ORAL | 2 refills | Status: DC | PRN

## 2024-07-03 MED ORDER — GABAPENTIN 600 MG PO TABS: 600.0000 mg | ORAL_TABLET | Freq: Two times a day (BID) | ORAL | 3 refills | Status: AC

## 2024-07-03 MED ORDER — HYDROCODONE-ACETAMINOPHEN 10-325 MG PO TABS: 1.0000 | ORAL_TABLET | Freq: Four times a day (QID) | ORAL | 0 refills | Status: DC | PRN

## 2024-07-03 NOTE — Progress Notes (Signed)
 Subjective:    Patient ID: Kristi [name], female    DOB: 06-Dec-1955, 68 y.o.   MRN: [id number]  HPI: Kristi [name] is a 68 y.o. female who returns for follow up appointment for chronic pain and medication refill. She states her pain is located in her bilateral hands with tingling, left shoulder, and bilateral feet with tingling. She rates her pain 7. Her current exercise regime is walking and performing stretching exercises.  Ms. Novell Morphine  equivalent is 40.00 MME.   Last UDS was Performed on 03/27/2024, it was consistent.    Pain Inventory Average Pain 6 Pain Right Now 7 My pain is sharp, burning, stabbing, tingling, and aching  In the last 24 hours, has pain interfered with the following? General activity 3 Relation with others 3 Enjoyment of life 4 What TIME of day is your pain at its worst? morning , daytime, evening, and night Sleep (in general) Good  Pain is worse with: walking, bending, sitting, inactivity, and standing Pain improves with: therapy/exercise and medication Relief from Meds: 5  Family History  Problem Relation Age of Onset   Lung cancer Father    Hypertension Father    Thyroid  cancer Father        dx in his 36s   Cancer Father        lung, thyroid , smoker   Breast cancer Paternal Aunt 80   Colon cancer Paternal Aunt        dx in her 50x   Cervical cancer Paternal Aunt        dzx in her 74s   Ovarian cancer Cousin        dx in her lage 59s   Breast cancer Cousin        maternal first cousin, once removed; dx in her late 51s   Breast cancer Cousin        maternal first cousin once removed; dx in late 25s   Hypertension Mother    Diabetes Mother    Dementia Mother    Memory loss Mother    Hypertension Brother    Seizures Brother        Alcohol induced.   Alcohol abuse Brother        drinker, smoker   COPD Brother    Cancer Paternal Uncle        oral cancer   Kidney cancer Paternal Grandmother    Arthritis Daughter        back surgery    Cancer Cousin        several paternal cousins with brain cancer, leukemia, and other cancers   Cancer Sister        stomach   Social History   Socioeconomic History   Marital status: Married    Spouse name: Garnette   Number of children: 1   Years of education: 12   Highest education level: Not on file  Occupational History   Occupation: INVENTORY Office manager: [employer]   Occupation: disability no longer working 2015  Tobacco Use   Smoking status: Never   Smokeless tobacco: Never  Vaping Use   Vaping status: Never Used  Substance and Sexual Activity   Alcohol use: No   Drug use: No   Sexual activity: Yes    Comment: lives with husband, disability/retirement. [employer], no dietary restrictions  Other Topics Concern   Not on file  Social History Narrative   Patient is married Wadie) and lives  at home with her husband.   Patient has one daughter   Patient drinks very little caffeine.   Left handed   Social Drivers of Health   Financial Resource Strain: Low Risk  (08/02/2023)   Overall Financial Resource Strain (CARDIA)    Difficulty of Paying Living Expenses: Not hard at all  Food Insecurity: No Food Insecurity (08/02/2023)   Hunger Vital Sign    Worried About Running Out of Food in the Last Year: Never true    Ran Out of Food in the Last Year: Never true  Transportation Needs: No Transportation Needs (08/02/2023)   PRAPARE - Administrator, Civil Service (Medical): No    Lack of Transportation (Non-Medical): No  Physical Activity: Sufficiently Active (08/02/2023)   Exercise Vital Sign    Days of Exercise per Week: 7 days    Minutes of Exercise per Session: 120 min  Stress: No Stress Concern Present (08/02/2023)   [employer] of Occupational Health - Occupational Stress Questionnaire    Feeling of Stress : Not at all  Social Connections: Moderately Integrated (08/02/2023)   Social Connection and Isolation Panel     Frequency of Communication with Friends and Family: More than three times a week    Frequency of Social Gatherings with Friends and Family: Twice a week    Attends Religious Services: More than 4 times per year    Active Member of [employer] or Organizations: No    Attends Banker Meetings: Never    Marital Status: Married   Past Surgical History:  Procedure Laterality Date   ANAL SPHINCTEROTOMY  04/2011   APPENDECTOMY  1980   AXILLARY LYMPH NODE DISSECTION Left 02/04/2013   Procedure: LEFT AXILLARY LYMPH NODE DISSECTION;  Surgeon: Jina Nephew, MD;  Location: [facility] OR;  Service: General;  Laterality: Left;  End: 1512   BREAST LUMPECTOMY WITH NEEDLE LOCALIZATION Left 02/04/2013   Procedure: LEFT BREAST LUMPECTOMY WITH NEEDLE LOCALIZATION;  Surgeon: Jina Nephew, MD;  Location: [facility] OR;  Service: General;  Laterality: Left;   BREAST SURGERY     Lumpectomy in april 2014   HEMORRHOID SURGERY  04/2011   ligation   MASTECTOMY Left 02/15/2017   PORT-A-CATH REMOVAL N/A 04/16/2014   Procedure: REMOVAL PORT-A-CATH;  Surgeon: Jina Nephew, MD;  Location: [facility];  Service: General;  Laterality: N/A;   PORTACATH PLACEMENT Right 02/04/2013   Procedure: INSERTION PORT-A-CATH;  Surgeon: Jina Nephew, MD;  Location: [facility];  Service: General;  Laterality: Right;  Start Time: 8461.   SHOULDER ARTHROSCOPY WITH ROTATOR CUFF REPAIR AND SUBACROMIAL DECOMPRESSION Left 02/24/2014   Procedure: SHOULDER ARTHROSCOPY WITH ROTATOR CUFF REPAIR AND SUBACROMIAL DECOMPRESSION;  Surgeon: Cordella Glendia Hutchinson, MD;  Location: [facility] OR;  Service: Orthopedics;  Laterality: Left;  LEFT SHOULDER DIAGNOSTIC OPERATIVE ARTHROSCOPY, SUBACROMIAL DECOMPRESSION, ROTATOR CUFF TEAR REPAIR   SIMPLE MASTECTOMY WITH AXILLARY SENTINEL NODE BIOPSY Left 02/15/2017   Procedure: LEFT MASTECTOMY;  Surgeon: Jina Nephew, MD;  Location: [facility];  Service: General;  Laterality: Left;   TOTAL MASTECTOMY Right 12/26/2018   Procedure: RIGHT BREAST PROPHYLATIC  MASTECTOMY;  Surgeon: Nephew Jina, MD;  Location: [facility];  Service: General;  Laterality: Right;   Past Surgical History:  Procedure Laterality Date   ANAL SPHINCTEROTOMY  04/2011   APPENDECTOMY  1980   AXILLARY LYMPH NODE DISSECTION Left 02/04/2013   Procedure: LEFT AXILLARY LYMPH NODE DISSECTION;  Surgeon: Jina Nephew, MD;  Location: [facility];  Service: General;  Laterality: Left;  End: 1512  BREAST LUMPECTOMY WITH NEEDLE LOCALIZATION Left 02/04/2013   Procedure: LEFT BREAST LUMPECTOMY WITH NEEDLE LOCALIZATION;  Surgeon: Jina Nephew, MD;  Location: [facility];  Service: General;  Laterality: Left;   BREAST SURGERY     Lumpectomy in april 2014   HEMORRHOID SURGERY  04/2011   ligation   MASTECTOMY Left 02/15/2017   PORT-A-CATH REMOVAL N/A 04/16/2014   Procedure: REMOVAL PORT-A-CATH;  Surgeon: Jina Nephew, MD;  Location: [facility];  Service: General;  Laterality: N/A;   PORTACATH PLACEMENT Right 02/04/2013   Procedure: INSERTION PORT-A-CATH;  Surgeon: Jina Nephew, MD;  Location: [facility];  Service: General;  Laterality: Right;  Start Time: 8461.   SHOULDER ARTHROSCOPY WITH ROTATOR CUFF REPAIR AND SUBACROMIAL DECOMPRESSION Left 02/24/2014   Procedure: SHOULDER ARTHROSCOPY WITH ROTATOR CUFF REPAIR AND SUBACROMIAL DECOMPRESSION;  Surgeon: Cordella Glendia Hutchinson, MD;  Location: [facility] OR;  Service: Orthopedics;  Laterality: Left;  LEFT SHOULDER DIAGNOSTIC OPERATIVE ARTHROSCOPY, SUBACROMIAL DECOMPRESSION, ROTATOR CUFF TEAR REPAIR   SIMPLE MASTECTOMY WITH AXILLARY SENTINEL NODE BIOPSY Left 02/15/2017   Procedure: LEFT MASTECTOMY;  Surgeon: Jina Nephew, MD;  Location: [facility];  Service: General;  Laterality: Left;   TOTAL MASTECTOMY Right 12/26/2018   Procedure: RIGHT BREAST PROPHYLATIC MASTECTOMY;  Surgeon: Nephew Jina, MD;  Location: [facility];  Service: General;  Laterality: Right;   Past Medical History:  Diagnosis Date   Abnormal gait 12/17/2019   Acute blood loss anemia    Anal fissure 05/03/2011   Anal pain 04/27/2021    Anemia    Iron deficinecy anemia   Anemia 05/14/2017   Anxiety and depression 05/15/2014   Arthritis    Body mass index (BMI) 30.0-30.9, adult 12/17/2019   Breast cancer (HCC)    left ,last radiation 2'15, last chemo 8'14   Cervical cancer screening 06/18/2018   Menarche at 11 Regular and moderate flow  history of abnormal pap in past, 1 mild abnormality years ago that resolved spontaneously on recheck G1P1, s/p 1 svd history of abnormal MGM, b/o breast cancer 2014 No concerns today  gyn surgeries. Lumpectomy  LMP around early 2014   Cervical stenosis of spine 12/17/2019   Constipation 11/13/2016   Contracture of axilla 05/20/2013   Debility 01/18/2020   Essential (primary) hypertension 12/17/2019   Flushing 02/20/2014   Gait instability 12/17/2019   Genetic testing 02/20/2019   Negative genetic testing on the Comprehensive Cancer Panel.  The Comprehensive Common Cancer Panel offered by [facility] includes sequencing and/or deletion duplication testing of the following 46 genes: APC, ATM, AXIN2, BAP1, BARD1, BMPR1A, BRCA1, BRCA2, BRIP1, CDH1, CDK4, CDKN2A, CHEK2, EPCAM, FANCC, FH, FLCN, HOXB13, MET, MITF,  MLH1, MSH2, MSH6, MUTYH, NBN, NF1, NTHL1,  PALB2, PMS2, POLD1, POLE, P   H/O: CVA (cerebrovascular accident) 06/19/2020   Hereditary and idiopathic peripheral neuropathy 11/11/2013   Herpes zoster 05/15/2014   History of chicken pox    History of left breast cancer 02/15/2017   History of radiation therapy 09/09/13-10/28/13   45 gray to left breast, lumpectomy cavity boosted to 63 gray   HTN (hypertension) 11/13/2016   Hyperglycemia 01/09/2016   Hyperlipidemia 05/14/2017   Hypertension    Internal hemorrhoids without complication 05/03/2011   Knee pain, bilateral 11/14/2016   Left upper extremity numbness 06/19/2018   Major depressive disorder, recurrent episode, severe (HCC) 01/12/2014   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 Overview:  Left IDC, ER 3%, PR-, Her2neu-  Last Assessment & Plan:  Pt will continue to take her tamoxifen  as previously directed. She appears to be tolerating this with only c/o moderate, occ hot flashes.   Mild neurocognitive disorder due to multiple etiologies 03/17/2020   MRSA (methicillin resistant Staphylococcus aureus) 2009   right groin area-no issues now. 04-07-14 PCR screen negative today.   Neuropathy    Neuropathy of hand, left 09/21/2015   Nodule of finger of left hand 11/14/2016   Palpitations 07/30/2020   Personal history of colonic polyps 04/27/2021   Preventative health care 11/13/2016   Right hip pain 11/14/2016   S/P mastectomy, right 12/26/2018   Sebaceous cyst 04/12/2013   Shoulder joint pain 12/12/2013   Spinal stenosis in cervical region 10/09/2019   Strain of rotator cuff 02/24/2014   Stroke (HCC) 06/2020   Vertigo    Ht 5' (1.524 m)   Wt 173 lb (78.5 kg)   LMP 01/20/2013   BMI 33.79 kg/m   Opioid Risk Score:   Fall Risk Score:  `1  Depression screen PHQ 2/9     03/27/2024    1:22 PM 01/31/2024   12:59 PM 11/22/2023    1:13 PM 08/02/2023    2:25 PM 07/24/2023   12:51 PM 06/08/2023    1:44 PM 03/29/2023    1:17 PM  Depression screen PHQ 2/9  Decreased Interest 0 0 0 0 0 0 0  Down, Depressed, Hopeless 0 0 0 0 0 0 0  PHQ - 2 Score 0 0 0 0 0 0 0  Altered sleeping      0   Tired, decreased energy      0   Change in appetite      0   Feeling bad or failure about yourself       0   Trouble concentrating      0   Moving slowly or fidgety/restless      0   Suicidal thoughts      0   PHQ-9 Score      0   Difficult doing work/chores      Not difficult at all       Review of Systems  Musculoskeletal:        B/L feet pain Left shoulder pain  All other systems reviewed and are negative.      Objective:   Physical Exam Vitals and nursing note reviewed.  Constitutional:       Appearance: Normal appearance.  Cardiovascular:     Rate and Rhythm: Normal rate and regular rhythm.     Pulses: Normal pulses.     Heart sounds: Normal heart sounds.  Pulmonary:     Effort: Pulmonary effort is normal.     Breath sounds: Normal breath sounds.  Musculoskeletal:     Comments: Normal Muscle Bulk and Muscle Testing Reveals:  Upper Extremities: Full ROM and Muscle Strength 5/5  Lower Extremities: Full ROM and Muscle Strength 5/5 Arises from Table with Ease  Narrow Based  Gait     Skin:    General: Skin is warm and dry.  Neurological:     Mental Status: She is alert and oriented to person, place, and time.  Psychiatric:        Mood and Affect: Mood normal.        Behavior: Behavior normal.          Assessment & Plan:  1. Chemotherapy Induced Peripheral Neuropathy: Continue current medication  regimen with Gabapentin . Continue to Monitor. 07/03/2024 2. Chronic Pain Syndrome: Continue  Tramadol  50 mg one tablet twice a day for breakthrough pain #60 and  Refilled:  Hydrocodone  10/325 mg one tablet4 times a day as needed for pain #120. We will continue the opioid monitoring program, this consists of regular clinic visits, examinations, urine drug screen, pill counts as well as use of [place]  Controlled Substance Reporting system. A 12 month History has been reviewed on the [place]  Controlled Substance Reporting System on 07/03/2024 3. Polyarthralgia: Continue HEP as Tolerated: Continue to Monitor. 07/03/2024 4. Chronic Left Shoulder Pain: Continue HEP as Tolerated. Continue to Monitor. 07/03/2024 5. Bradycardia: Apical Pulse checked: She will F/U with Her PCP , she verbalizes understanding. 07/03/2024 F/U in 2 months

## 2024-07-21 ENCOUNTER — Telehealth: Payer: Self-pay | Admitting: Registered Nurse

## 2024-07-21 NOTE — Telephone Encounter (Signed)
 PMP was Reviewed.  Kristi [name] was called, her Gabapentin  prescription was sent to pharmacy on 07/03/2024. She wil call pharmacy and speak with the pharmacist. She verbalizes understanding.

## 2024-07-21 NOTE — Telephone Encounter (Signed)
 Patient called stating she went to the [facility] and her prescription for her gabapentin  was not ready. She would ike to speak with someone to see what is going on.

## 2024-07-29 ENCOUNTER — Ambulatory Visit

## 2024-08-26 ENCOUNTER — Other Ambulatory Visit: Payer: Self-pay | Admitting: Family Medicine

## 2024-08-26 DIAGNOSIS — I10 Essential (primary) hypertension: Secondary | ICD-10-CM

## 2024-08-31 NOTE — Assessment & Plan Note (Signed)
 Well controlled, no changes to meds. Encouraged heart healthy diet such as the DASH diet and exercise as tolerated.

## 2024-08-31 NOTE — Assessment & Plan Note (Signed)
 [id number] acceptable, minimize simple carbs. Increase exercise as tolerated.

## 2024-08-31 NOTE — Assessment & Plan Note (Signed)
Patient encouraged to maintain heart healthy diet, regular exercise, adequate sleep. Consider daily probiotics. Take medications as prescribed. Labs ordered and reviewed. Dexa 2022, normal repeat in 2027. MM 2020. Colonoscopy 2016, told to repeat in 5 years. Given documents for and discussed need for ACP to be completed. Patient agrees will let us know if she has any further questions

## 2024-08-31 NOTE — Assessment & Plan Note (Signed)
 Encourage heart healthy diet such as MIND or DASH diet, increase exercise, avoid trans fats, simple carbohydrates and processed foods, consider a krill or fish or flaxseed oil cap daily.

## 2024-09-03 NOTE — Progress Notes (Signed)
 Subjective:    Patient ID: Kristi [name], female    DOB: [birth year], 68 y.o.   MRN: [id number]  HPI: Kristi [name] is a 68 y.o. female who returns for follow up appointment for chronic pain and medication refill. She states her pain is located in her right hand and bilateral feet with tingling and  burning. She rates her pain 3. Her current exercise regimen walking and performing stretching exercises   Ms. Gardiner Morphine  equivalent is 60.00 MME.   Oral Swab was Performed today.      Pain Inventory Average Pain 5 Pain Right Now 3 My pain is constant, sharp, burning, dull, stabbing, tingling, and aching  In the last 24 hours, has pain interfered with the following? General activity 0 Relation with others 0 Enjoyment of life 0 What TIME of day is your pain at its worst? morning  Sleep (in general) Good  Pain is worse with: walking and sitting Pain improves with: rest and medication Relief from Meds: 7  Family History  Problem Relation Age of Onset   Lung cancer Father    Hypertension Father    Thyroid  cancer Father        dx in his 18s   Cancer Father        lung, thyroid , smoker   Breast cancer Paternal Aunt 70   Colon cancer Paternal Aunt        dx in her 50x   Cervical cancer Paternal Aunt        dzx in her 73s   Ovarian cancer Cousin        dx in her lage 42s   Breast cancer Cousin        maternal first cousin, once removed; dx in her late 89s   Breast cancer Cousin        maternal first cousin once removed; dx in late 101s   Hypertension Mother    Diabetes Mother    Dementia Mother    Memory loss Mother    Hypertension Brother    Seizures Brother        Alcohol induced.   Alcohol abuse Brother        drinker, smoker   COPD Brother    Cancer Paternal Uncle        oral cancer   Kidney cancer Paternal Grandmother    Arthritis Daughter        back surgery   Cancer Cousin        several paternal cousins with brain cancer, leukemia, and other cancers    Cancer Sister        stomach   Social History   Socioeconomic History   Marital status: Married    Spouse name: Garnette   Number of children: 1   Years of education: 12   Highest education level: Some college, no degree  Occupational History   Occupation: INVENTORY Office Manager: [employer]   Occupation: disability no longer working 2015  Tobacco Use   Smoking status: Never   Smokeless tobacco: Never  Vaping Use   Vaping status: Never Used  Substance and Sexual Activity   Alcohol use: No   Drug use: No   Sexual activity: Yes    Comment: lives with husband, disability/retirement. [employer], no dietary restrictions  Other Topics Concern   Not on file  Social History Narrative   Patient is married Wadie) and lives at home with her husband.  Patient has one daughter   Patient drinks very little caffeine.   Left handed   Social Drivers of Health   Financial Resource Strain: Low Risk  (09/03/2024)   Overall Financial Resource Strain (CARDIA)    Difficulty of Paying Living Expenses: Not hard at all  Food Insecurity: No Food Insecurity (09/03/2024)   Hunger Vital Sign    Worried About Running Out of Food in the Last Year: Never true    Ran Out of Food in the Last Year: Never true  Transportation Needs: No Transportation Needs (09/03/2024)   PRAPARE - Administrator, Civil Service (Medical): No    Lack of Transportation (Non-Medical): No  Physical Activity: Sufficiently Active (09/03/2024)   Exercise Vital Sign    Days of Exercise per Week: 4 days    Minutes of Exercise per Session: 140 min  Stress: No Stress Concern Present (09/03/2024)   [employer] of Occupational Health - Occupational Stress Questionnaire    Feeling of Stress: Not at all  Social Connections: Socially Integrated (09/03/2024)   Social Connection and Isolation Panel    Frequency of Communication with Friends and Family: More than three times a week     Frequency of Social Gatherings with Friends and Family: Twice a week    Attends Religious Services: More than 4 times per year    Active Member of [employer] or Organizations: Yes    Attends Banker Meetings: Patient declined    Marital Status: Married   Past Surgical History:  Procedure Laterality Date   ANAL SPHINCTEROTOMY  04/2011   APPENDECTOMY  1980   AXILLARY LYMPH NODE DISSECTION Left 02/04/2013   Procedure: LEFT AXILLARY LYMPH NODE DISSECTION;  Surgeon: Jina Nephew, MD;  Location: [facility] OR;  Service: General;  Laterality: Left;  End: 1512   BREAST LUMPECTOMY WITH NEEDLE LOCALIZATION Left 02/04/2013   Procedure: LEFT BREAST LUMPECTOMY WITH NEEDLE LOCALIZATION;  Surgeon: Jina Nephew, MD;  Location: [facility] OR;  Service: General;  Laterality: Left;   BREAST SURGERY     Lumpectomy in april 2014   HEMORRHOID SURGERY  04/2011   ligation   MASTECTOMY Left 02/15/2017   PORT-A-CATH REMOVAL N/A 04/16/2014   Procedure: REMOVAL PORT-A-CATH;  Surgeon: Jina Nephew, MD;  Location: [facility];  Service: General;  Laterality: N/A;   PORTACATH PLACEMENT Right 02/04/2013   Procedure: INSERTION PORT-A-CATH;  Surgeon: Jina Nephew, MD;  Location: [facility];  Service: General;  Laterality: Right;  Start Time: 8461.   SHOULDER ARTHROSCOPY WITH ROTATOR CUFF REPAIR AND SUBACROMIAL DECOMPRESSION Left 02/24/2014   Procedure: SHOULDER ARTHROSCOPY WITH ROTATOR CUFF REPAIR AND SUBACROMIAL DECOMPRESSION;  Surgeon: Cordella Glendia Hutchinson, MD;  Location: [facility] OR;  Service: Orthopedics;  Laterality: Left;  LEFT SHOULDER DIAGNOSTIC OPERATIVE ARTHROSCOPY, SUBACROMIAL DECOMPRESSION, ROTATOR CUFF TEAR REPAIR   SIMPLE MASTECTOMY WITH AXILLARY SENTINEL NODE BIOPSY Left 02/15/2017   Procedure: LEFT MASTECTOMY;  Surgeon: Jina Nephew, MD;  Location: [facility];  Service: General;  Laterality: Left;   TOTAL MASTECTOMY Right 12/26/2018   Procedure: RIGHT BREAST PROPHYLATIC MASTECTOMY;  Surgeon: Nephew Jina, MD;  Location: [facility];  Service: General;   Laterality: Right;   Past Surgical History:  Procedure Laterality Date   ANAL SPHINCTEROTOMY  04/2011   APPENDECTOMY  1980   AXILLARY LYMPH NODE DISSECTION Left 02/04/2013   Procedure: LEFT AXILLARY LYMPH NODE DISSECTION;  Surgeon: Jina Nephew, MD;  [facility]: [facility];  Service: General;  Laterality: Left;  End: 1512   BREAST LUMPECTOMY WITH NEEDLE LOCALIZATION  Left 02/04/2013   Procedure: LEFT BREAST LUMPECTOMY WITH NEEDLE LOCALIZATION;  Surgeon: Jina Nephew, MD;  [facility]: [facility];  Service: General;  Laterality: Left;   BREAST SURGERY     Lumpectomy in april 2014   HEMORRHOID SURGERY  04/2011   ligation   MASTECTOMY Left 02/15/2017   PORT-A-CATH REMOVAL N/A 04/16/2014   Procedure: REMOVAL PORT-A-CATH;  Surgeon: Jina Nephew, MD;  Location: [facility];  Service: General;  Laterality: N/A;   PORTACATH PLACEMENT Right 02/04/2013   Procedure: INSERTION PORT-A-CATH;  Surgeon: Jina Nephew, MD;  [facility];  Service: General;  Laterality: Right;  Start Time: 8461.   SHOULDER ARTHROSCOPY WITH ROTATOR CUFF REPAIR AND SUBACROMIAL DECOMPRESSION Left 02/24/2014   Procedure: SHOULDER ARTHROSCOPY WITH ROTATOR CUFF REPAIR AND SUBACROMIAL DECOMPRESSION;  Surgeon: Cordella Glendia Hutchinson, MD;  Location: [facility] OR;  Service: Orthopedics;  Laterality: Left;  LEFT SHOULDER DIAGNOSTIC OPERATIVE ARTHROSCOPY, SUBACROMIAL DECOMPRESSION, ROTATOR CUFF TEAR REPAIR   SIMPLE MASTECTOMY WITH AXILLARY SENTINEL NODE BIOPSY Left 02/15/2017   Procedure: LEFT MASTECTOMY;  Surgeon: Jina Nephew, MD;  [facility]: [facility];  Service: General;  Laterality: Left;   TOTAL MASTECTOMY Right 12/26/2018   Procedure: RIGHT BREAST PROPHYLATIC MASTECTOMY;  Surgeon: Nephew Jina, MD;  Location: [facility];  Service: General;  Laterality: Right;   Past Medical History:  Diagnosis Date   Abnormal gait 12/17/2019   Acute blood loss anemia    Anal fissure 05/03/2011   Anal pain 04/27/2021   Anemia    Iron deficinecy anemia   Anemia 05/14/2017   Anxiety and  depression 05/15/2014   Arthritis    Body mass index (BMI) 30.0-30.9, adult 12/17/2019   Breast cancer (HCC)    left ,last radiation 2'15, last chemo 8'14   Cervical cancer screening 06/18/2018   Menarche at 11 Regular and moderate flow  history of abnormal pap in past, 1 mild abnormality years ago that resolved spontaneously on recheck G1P1, s/p 1 svd history of abnormal MGM, b/o breast cancer 2014 No concerns today  gyn surgeries. Lumpectomy  LMP around early 2014   Cervical stenosis of spine 12/17/2019   Constipation 11/13/2016   Contracture of axilla 05/20/2013   Debility 01/18/2020   Essential (primary) hypertension 12/17/2019   Flushing 02/20/2014   Gait instability 12/17/2019   Genetic testing 02/20/2019   Negative genetic testing on the Comprehensive Cancer Panel.  The Comprehensive Common Cancer Panel offered by [facility] includes sequencing and/or deletion duplication testing of the following 46 genes: APC, ATM, AXIN2, BAP1, BARD1, BMPR1A, BRCA1, BRCA2, BRIP1, CDH1, CDK4, CDKN2A, CHEK2, EPCAM, FANCC, FH, FLCN, HOXB13, MET, MITF,  MLH1, MSH2, MSH6, MUTYH, NBN, NF1, NTHL1,  PALB2, PMS2, POLD1, POLE, P   H/O: CVA (cerebrovascular accident) 06/19/2020   Hereditary and idiopathic peripheral neuropathy 11/11/2013   Herpes zoster 05/15/2014   History of chicken pox    History of left breast cancer 02/15/2017   History of radiation therapy 09/09/13-10/28/13   45 gray to left breast, lumpectomy cavity boosted to 63 gray   HTN (hypertension) 11/13/2016   Hyperglycemia 01/09/2016   Hyperlipidemia 05/14/2017   Hypertension    Internal hemorrhoids without complication 05/03/2011   Knee pain, bilateral 11/14/2016   Left upper extremity numbness 06/19/2018   Major depressive disorder, recurrent episode, severe (HCC) 01/12/2014   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 Overview:   Left IDC, ER  3%, PR-, Her2neu-  Last Assessment & Plan:  Pt will continue to take her tamoxifen  as previously directed. She appears to be tolerating this with only c/o moderate, occ hot flashes.   Mild neurocognitive disorder due to multiple etiologies 03/17/2020   MRSA (methicillin resistant Staphylococcus aureus) 2009   right groin area-no issues now. 04-07-14 PCR screen negative today.   Neuropathy    Neuropathy of hand, left 09/21/2015   Nodule of finger of left hand 11/14/2016   Palpitations 07/30/2020   Personal history of colonic polyps 04/27/2021   Preventative health care 11/13/2016   Right hip pain 11/14/2016   S/P mastectomy, right 12/26/2018   Sebaceous cyst 04/12/2013   Shoulder joint pain 12/12/2013   Spinal stenosis in cervical region 10/09/2019   Strain of rotator cuff 02/24/2014   Stroke (HCC) 06/2020   Vertigo    LMP 01/20/2013   Opioid Risk Score:   Fall Risk Score:  `1  Depression screen PHQ 2/9     03/27/2024    1:22 PM 01/31/2024   12:59 PM 11/22/2023    1:13 PM 08/02/2023    2:25 PM 07/24/2023   12:51 PM 06/08/2023    1:44 PM 03/29/2023    1:17 PM  Depression screen PHQ 2/9  Decreased Interest 0 0 0 0 0 0 0  Down, Depressed, Hopeless 0 0 0 0 0 0 0  PHQ - 2 Score 0 0 0 0 0 0 0  Altered sleeping      0   Tired, decreased energy      0   Change in appetite      0   Feeling bad or failure about yourself       0   Trouble concentrating      0   Moving slowly or fidgety/restless      0   Suicidal thoughts      0   PHQ-9 Score      0   Difficult doing work/chores      Not difficult at all     Review of Systems  Musculoskeletal:        Pain in both feet & left hand  All other systems reviewed and are negative.      Objective:   Physical Exam Vitals and nursing note reviewed.  Constitutional:      Appearance: Normal appearance.  Cardiovascular:     Rate and Rhythm: Normal rate and regular rhythm.     Pulses: Normal pulses.      Heart sounds: Normal heart sounds.  Pulmonary:     Effort: Pulmonary effort is normal.     Breath sounds: Normal breath sounds.  Musculoskeletal:     Comments: Normal Muscle Bulk and Muscle Testing Reveals:  Upper Extremities: Full ROM and Muscle Strength 5/5 Lower Extremities: Full ROM and Muscle Strength 5/5 Arises from Table with Ease  Narrow Based  Gait     Skin:    General: Skin is warm and dry.  Neurological:     Mental Status: She is alert and oriented to person, place, and time.  Psychiatric:        Mood and Affect: Mood normal.        Behavior: Behavior normal.          Assessment & Plan:  1. Chemotherapy Induced Peripheral Neuropathy: Continue current medication regimen with Gabapentin . Continue to Monitor. 09/04/2024 2. Chronic Pain Syndrome: Continue  Tramadol  50 mg one tablet twice a day for breakthrough  pain #60 and  Refilled:  Hydrocodone  10/325 mg one tablet4 times a day as needed for pain #120. We will continue the opioid monitoring program, this consists of regular clinic visits, examinations, urine drug screen, pill counts as well as use of [place]  Controlled Substance Reporting system. A 12 month History has been reviewed on the [place]  Controlled Substance Reporting System on 09/04/2024 3. Polyarthralgia: Continue HEP as Tolerated: Continue to Monitor. 09/04/2024 4. Chronic Left Shoulder Pain: Continue HEP as Tolerated. Continue to Monitor. 09/04/2024  F/U in 2 months

## 2024-09-04 ENCOUNTER — Encounter: Payer: Self-pay | Admitting: Family Medicine

## 2024-09-04 ENCOUNTER — Encounter: Payer: Self-pay | Admitting: Registered Nurse

## 2024-09-04 ENCOUNTER — Ambulatory Visit: Payer: Medicare Other | Admitting: Family Medicine

## 2024-09-04 ENCOUNTER — Encounter: Attending: Registered Nurse | Admitting: Registered Nurse

## 2024-09-04 VITALS — BP 146/80 | HR 71 | Temp 98.1°F | Ht 60.0 in | Wt 176.2 lb

## 2024-09-04 VITALS — BP 155/74 | HR 66 | Ht 60.0 in | Wt 176.0 lb

## 2024-09-04 DIAGNOSIS — G620 Drug-induced polyneuropathy: Secondary | ICD-10-CM | POA: Insufficient documentation

## 2024-09-04 DIAGNOSIS — E782 Mixed hyperlipidemia: Secondary | ICD-10-CM | POA: Diagnosis not present

## 2024-09-04 DIAGNOSIS — Z0000 Encounter for general adult medical examination without abnormal findings: Secondary | ICD-10-CM | POA: Diagnosis not present

## 2024-09-04 DIAGNOSIS — T451X5A Adverse effect of antineoplastic and immunosuppressive drugs, initial encounter: Secondary | ICD-10-CM | POA: Insufficient documentation

## 2024-09-04 DIAGNOSIS — Z5181 Encounter for therapeutic drug level monitoring: Secondary | ICD-10-CM | POA: Insufficient documentation

## 2024-09-04 DIAGNOSIS — R739 Hyperglycemia, unspecified: Secondary | ICD-10-CM | POA: Diagnosis not present

## 2024-09-04 DIAGNOSIS — I10 Essential (primary) hypertension: Secondary | ICD-10-CM

## 2024-09-04 DIAGNOSIS — Z79891 Long term (current) use of opiate analgesic: Secondary | ICD-10-CM | POA: Diagnosis present

## 2024-09-04 DIAGNOSIS — G894 Chronic pain syndrome: Secondary | ICD-10-CM | POA: Insufficient documentation

## 2024-09-04 LAB — CBC WITH DIFFERENTIAL/PLATELET
Basophils Absolute: 0 K/uL (ref 0.0–0.1)
Basophils Relative: 0.1 % (ref 0.0–3.0)
Eosinophils Absolute: 0.1 K/uL (ref 0.0–0.7)
Eosinophils Relative: 1.5 % (ref 0.0–5.0)
HCT: 38.1 % (ref 36.0–46.0)
Hemoglobin: 12.2 g/dL (ref 12.0–15.0)
Lymphocytes Relative: 48.3 % — ABNORMAL HIGH (ref 12.0–46.0)
Lymphs Abs: 2.6 K/uL (ref 0.7–4.0)
MCHC: 32 g/dL (ref 30.0–36.0)
MCV: 87.8 fl (ref 78.0–100.0)
Monocytes Absolute: 0.3 K/uL (ref 0.1–1.0)
Monocytes Relative: 5.2 % (ref 3.0–12.0)
Neutro Abs: 2.4 K/uL (ref 1.4–7.7)
Neutrophils Relative %: 44.9 % (ref 43.0–77.0)
Platelets: 157 K/uL (ref 150.0–400.0)
RBC: 4.34 Mil/uL (ref 3.87–5.11)
RDW: 14.8 % (ref 11.5–15.5)
WBC: 5.3 K/uL (ref 4.0–10.5)

## 2024-09-04 LAB — COMPREHENSIVE METABOLIC PANEL WITH GFR
ALT: 19 U/L (ref 0–35)
AST: 24 U/L (ref 0–37)
Albumin: 4.4 g/dL (ref 3.5–5.2)
Alkaline Phosphatase: 103 U/L (ref 39–117)
BUN: 16 mg/dL (ref 6–23)
CO2: 30 meq/L (ref 19–32)
Calcium: 10.4 mg/dL (ref 8.4–10.5)
Chloride: 105 meq/L (ref 96–112)
Creatinine, Ser: 1.09 mg/dL (ref 0.40–1.20)
GFR: 51.8 mL/min — ABNORMAL LOW (ref >60.00)
Glucose, Bld: 91 mg/dL (ref 70–99)
Potassium: 4.9 meq/L (ref 3.5–5.1)
Sodium: 141 meq/L (ref 135–145)
Total Bilirubin: 0.5 mg/dL (ref 0.2–1.2)
Total Protein: 7.3 g/dL (ref 6.0–8.3)

## 2024-09-04 LAB — TSH: TSH: 1.26 u[IU]/mL (ref 0.35–5.50)

## 2024-09-04 LAB — LIPID PANEL
Cholesterol: 121 mg/dL (ref 0–200)
HDL: 55.6 mg/dL (ref >39.00)
LDL Cholesterol: 51 mg/dL (ref 0–99)
NonHDL: 65.63
Total CHOL/HDL Ratio: 2
Triglycerides: 72 mg/dL (ref 0.0–149.0)
VLDL: 14.4 mg/dL (ref 0.0–40.0)

## 2024-09-04 MED ORDER — HYDROCODONE-ACETAMINOPHEN 10-325 MG PO TABS: 1.0000 | ORAL_TABLET | Freq: Four times a day (QID) | ORAL | 0 refills | Status: DC | PRN

## 2024-09-04 MED ORDER — HYDROCODONE-ACETAMINOPHEN 10-325 MG PO TABS
1.0000 | ORAL_TABLET | Freq: Four times a day (QID) | ORAL | 0 refills | Status: DC | PRN
Start: 2024-09-04 — End: 2024-09-04

## 2024-09-04 MED ORDER — GABAPENTIN 300 MG PO CAPS: 900.0000 mg | ORAL_CAPSULE | Freq: Every day | ORAL | 3 refills | Status: AC

## 2024-09-04 NOTE — Patient Instructions (Addendum)
 Omron blood pressure cuff   Check BP weekly at rest x 10-15 minutes Goal is 100-140/60-90 pulse 60-90   All [facility] are now walk in vaccine clinics M-F 9-4  Annual covid and flu vaccine  Prevnar 20  RSV, Respiratory Syncitial Virus Vaccine, Arexvy   Tetanus  Shingrix is the new shingles shot, 2 shots over 2-6 months, confirm coverage with insurance and document, then can return here for shots with nurse appt or at pharmacy   Preventive Care 65 Years and Older, Female Preventive care refers to lifestyle choices and visits with your health care provider that can promote health and wellness. Preventive care visits are also called wellness exams. What can I expect for my preventive care visit? Counseling Your health care provider may ask you questions about your: Medical history, including: Past medical problems. Family medical history. Pregnancy and menstrual history. History of falls. Current health, including: Memory and ability to understand (cognition). Emotional well-being. Home life and relationship well-being. Sexual activity and sexual health. Lifestyle, including: Alcohol, nicotine or tobacco, and drug use. Access to firearms. Diet, exercise, and sleep habits. Work and work astronomer. Sunscreen use. Safety issues such as seatbelt and bike helmet use. Physical exam Your health care provider will check your: Height and weight. These may be used to calculate your BMI (body mass index). BMI is a measurement that tells if you are at a healthy weight. Waist circumference. This measures the distance around your waistline. This measurement also tells if you are at a healthy weight and may help predict your risk of certain diseases, such as type 2 diabetes and high blood pressure. Heart rate and blood pressure. Body temperature. Skin for abnormal spots. What immunizations do I need?  Vaccines are usually given at various ages, according to a schedule. Your  health care provider will recommend vaccines for you based on your age, medical history, and lifestyle or other factors, such as travel or where you work. What tests do I need? Screening Your health care provider may recommend screening tests for certain conditions. This may include: Lipid and cholesterol levels. Hepatitis C test. Hepatitis B test. HIV (human immunodeficiency virus) test. STI (sexually transmitted infection) testing, if you are at risk. Lung cancer screening. Colorectal cancer screening. Diabetes screening. This is done by checking your blood sugar (glucose) after you have not eaten for a while (fasting). Mammogram. Talk with your health care provider about how often you should have regular mammograms. BRCA-related cancer screening. This may be done if you have a family history of breast, ovarian, tubal, or peritoneal cancers. Bone density scan. This is done to screen for osteoporosis. Talk with your health care provider about your test results, treatment options, and if necessary, the need for more tests. Follow these instructions at home: Eating and drinking  Eat a diet that includes fresh fruits and vegetables, whole grains, lean protein, and low-fat dairy products. Limit your intake of foods with high amounts of sugar, saturated fats, and salt. Take vitamin and mineral supplements as recommended by your health care provider. Do not drink alcohol if your health care provider tells you not to drink. If you drink alcohol: Limit how much you have to 0-1 drink a day. Know how much alcohol is in your drink. In the [place] one drink equals one 12 oz bottle of beer (355 mL), one 5 oz glass of wine (148 mL), or one 1 oz glass of hard liquor (44 mL). Lifestyle Brush your teeth every morning and night  with fluoride toothpaste. Floss one time each day. Exercise for at least 30 minutes 5 or more days each week. Do not use any products that contain nicotine or tobacco. These  products include cigarettes, chewing tobacco, and vaping devices, such as e-cigarettes. If you need help quitting, ask your health care provider. Do not use drugs. If you are sexually active, practice safe sex. Use a condom or other form of protection in order to prevent STIs. Take aspirin  only as told by your health care provider. Make sure that you understand how much to take and what form to take. Work with your health care provider to find out whether it is safe and beneficial for you to take aspirin  daily. Ask your health care provider if you need to take a cholesterol-lowering medicine (statin). Find healthy ways to manage stress, such as: Meditation, yoga, or listening to music. Journaling. Talking to a trusted person. Spending time with friends and family. Minimize exposure to UV radiation to reduce your risk of skin cancer. Safety Always wear your seat belt while driving or riding in a vehicle. Do not drive: If you have been drinking alcohol. Do not ride with someone who has been drinking. When you are tired or distracted. While texting. If you have been using any mind-altering substances or drugs. Wear a helmet and other protective equipment during sports activities. If you have firearms in your house, make sure you follow all gun safety procedures. What's next? Visit your health care provider once a year for an annual wellness visit. Ask your health care provider how often you should have your eyes and teeth checked. Stay up to date on all vaccines. This information is not intended to replace advice given to you by your health care provider. Make sure you discuss any questions you have with your health care provider. Document Revised: 04/20/2021 Document Reviewed: 04/20/2021 Elsevier Patient Education  2024 [employer].     Preventive Care 65 Years and Older, Female Preventive care refers to lifestyle choices and visits with your health care provider that can promote  health and wellness. Preventive care visits are also called wellness exams. What can I expect for my preventive care visit? Counseling Your health care provider may ask you questions about your: Medical history, including: Past medical problems. Family medical history. Pregnancy and menstrual history. History of falls. Current health, including: Memory and ability to understand (cognition). Emotional well-being. Home life and relationship well-being. Sexual activity and sexual health. Lifestyle, including: Alcohol, nicotine or tobacco, and drug use. Access to firearms. Diet, exercise, and sleep habits. Work and work astronomer. Sunscreen use. Safety issues such as seatbelt and bike helmet use. Physical exam Your health care provider will check your: Height and weight. These may be used to calculate your BMI (body mass index). BMI is a measurement that tells if you are at a healthy weight. Waist circumference. This measures the distance around your waistline. This measurement also tells if you are at a healthy weight and may help predict your risk of certain diseases, such as type 2 diabetes and high blood pressure. Heart rate and blood pressure. Body temperature. Skin for abnormal spots. What immunizations do I need?  Vaccines are usually given at various ages, according to a schedule. Your health care provider will recommend vaccines for you based on your age, medical history, and lifestyle or other factors, such as travel or where you work. What tests do I need? Screening Your health care provider may recommend screening tests for  certain conditions. This may include: Lipid and cholesterol levels. Hepatitis C test. Hepatitis B test. HIV (human immunodeficiency virus) test. STI (sexually transmitted infection) testing, if you are at risk. Lung cancer screening. Colorectal cancer screening. Diabetes screening. This is done by checking your blood sugar (glucose) after you  have not eaten for a while (fasting). Mammogram. Talk with your health care provider about how often you should have regular mammograms. BRCA-related cancer screening. This may be done if you have a family history of breast, ovarian, tubal, or peritoneal cancers. Bone density scan. This is done to screen for osteoporosis. Talk with your health care provider about your test results, treatment options, and if necessary, the need for more tests. Follow these instructions at home: Eating and drinking  Eat a diet that includes fresh fruits and vegetables, whole grains, lean protein, and low-fat dairy products. Limit your intake of foods with high amounts of sugar, saturated fats, and salt. Take vitamin and mineral supplements as recommended by your health care provider. Do not drink alcohol if your health care provider tells you not to drink. If you drink alcohol: Limit how much you have to 0-1 drink a day. Know how much alcohol is in your drink. In the [place] one drink equals one 12 oz bottle of beer (355 mL), one 5 oz glass of wine (148 mL), or one 1 oz glass of hard liquor (44 mL). Lifestyle Brush your teeth every morning and night with fluoride toothpaste. Floss one time each day. Exercise for at least 30 minutes 5 or more days each week. Do not use any products that contain nicotine or tobacco. These products include cigarettes, chewing tobacco, and vaping devices, such as e-cigarettes. If you need help quitting, ask your health care provider. Do not use drugs. If you are sexually active, practice safe sex. Use a condom or other form of protection in order to prevent STIs. Take aspirin  only as told by your health care provider. Make sure that you understand how much to take and what form to take. Work with your health care provider to find out whether it is safe and beneficial for you to take aspirin  daily. Ask your health care provider if you need to take a cholesterol-lowering medicine  (statin). Find healthy ways to manage stress, such as: Meditation, yoga, or listening to music. Journaling. Talking to a trusted person. Spending time with friends and family. Minimize exposure to UV radiation to reduce your risk of skin cancer. Safety Always wear your seat belt while driving or riding in a vehicle. Do not drive: If you have been drinking alcohol. Do not ride with someone who has been drinking. When you are tired or distracted. While texting. If you have been using any mind-altering substances or drugs. Wear a helmet and other protective equipment during sports activities. If you have firearms in your house, make sure you follow all gun safety procedures. What's next? Visit your health care provider once a year for an annual wellness visit. Ask your health care provider how often you should have your eyes and teeth checked. Stay up to date on all vaccines. This information is not intended to replace advice given to you by your health care provider. Make sure you discuss any questions you have with your health care provider. Document Revised: 04/20/2021 Document Reviewed: 04/20/2021 Elsevier Patient Education  2024 [employer].

## 2024-09-05 ENCOUNTER — Ambulatory Visit: Payer: Self-pay | Admitting: Family Medicine

## 2024-09-05 LAB — HEMOGLOBIN A1C: Hgb A1c MFr Bld: 6 % (ref 4.6–6.5)

## 2024-09-07 ENCOUNTER — Encounter: Payer: Self-pay | Admitting: Family Medicine

## 2024-09-07 NOTE — Progress Notes (Signed)
 Subjective:    Patient ID: Kristi [name], female    DOB: Aug 20, 1956, 68 y.o.   MRN: [id number]  Chief Complaint  Patient presents with   Annual Exam    HPI Discussed the use of AI scribe software for clinical note transcription with the patient, who gave verbal consent to proceed.  History of Present Illness Kristi [name] is a 68 year old female who presents for a routine annual preventative exam and follow-up visit.  She feels well overall with no new or pressing concerns. No headaches or chest pain.  She maintains an active lifestyle, walking regularly and staying hydrated. Her sleep pattern is consistent, achieving 6 to 8 hours per night. She focuses on a diet rich in fiber and protein.  She has a history of a total mastectomy in 2020 and does not require further mammograms. Her last bone density scan was in 2022, with the next one due in 2027. She stopped Pap smears, with the last one possibly in 2016. She had a colonoscopy in July 2021 and is due for another in 2026. She had her flu shot at [facility] in [place] but has not had a recent tetanus shot or shingles vaccine. Denies CP/palp/SOB/HA/congestion/fevers/GI or GU c/o. Taking meds as prescribed     Past Medical History:  Diagnosis Date   Abnormal gait 12/17/2019   Acute blood loss anemia    Anal fissure 05/03/2011   Anal pain 04/27/2021   Anemia    Iron deficinecy anemia   Anemia 05/14/2017   Anxiety and depression 05/15/2014   Arthritis    Body mass index (BMI) 30.0-30.9, adult 12/17/2019   Breast cancer (HCC)    left ,last radiation 2'15, last chemo 8'14   Cervical cancer screening 06/18/2018   Menarche at 11 Regular and moderate flow  history of abnormal pap in past, 1 mild abnormality years ago that resolved spontaneously on recheck G1P1, s/p 1 svd history of abnormal MGM, b/o breast cancer 2014 No concerns today  gyn surgeries. Lumpectomy  LMP around early 2014   Cervical stenosis of spine 12/17/2019    Constipation 11/13/2016   Contracture of axilla 05/20/2013   Debility 01/18/2020   Essential (primary) hypertension 12/17/2019   Flushing 02/20/2014   Gait instability 12/17/2019   Genetic testing 02/20/2019   Negative genetic testing on the Comprehensive Cancer Panel.  The Comprehensive Common Cancer Panel offered by [facility] includes sequencing and/or deletion duplication testing of the following 46 genes: APC, ATM, AXIN2, BAP1, BARD1, BMPR1A, BRCA1, BRCA2, BRIP1, CDH1, CDK4, CDKN2A, CHEK2, EPCAM, FANCC, FH, FLCN, HOXB13, MET, MITF,  MLH1, MSH2, MSH6, MUTYH, NBN, NF1, NTHL1,  PALB2, PMS2, POLD1, POLE, P   H/O: CVA (cerebrovascular accident) 06/19/2020   Hereditary and idiopathic peripheral neuropathy 11/11/2013   Herpes zoster 05/15/2014   History of chicken pox    History of left breast cancer 02/15/2017   History of radiation therapy 09/09/13-10/28/13   45 gray to left breast, lumpectomy cavity boosted to 63 gray   HTN (hypertension) 11/13/2016   Hyperglycemia 01/09/2016   Hyperlipidemia 05/14/2017   Hypertension    Internal hemorrhoids without complication 05/03/2011   Knee pain, bilateral 11/14/2016   Left upper extremity numbness 06/19/2018   Major depressive disorder, recurrent episode, severe (HCC) 01/12/2014   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 Overview:   Left IDC, ER 3%,  PR-, Her2neu-  Last Assessment & Plan:  Pt will continue to take her tamoxifen  as previously directed. She appears to be tolerating this with only c/o moderate, occ hot flashes.   Mild neurocognitive disorder due to multiple etiologies 03/17/2020   MRSA (methicillin resistant Staphylococcus aureus) 2009   right groin area-no issues now. 04-07-14 PCR screen negative today.   Neuropathy    Neuropathy of hand, left 09/21/2015   Nodule of finger of left hand 11/14/2016    Palpitations 07/30/2020   Personal history of colonic polyps 04/27/2021   Preventative health care 11/13/2016   Right hip pain 11/14/2016   S/P mastectomy, right 12/26/2018   Sebaceous cyst 04/12/2013   Shoulder joint pain 12/12/2013   Spinal stenosis in cervical region 10/09/2019   Strain of rotator cuff 02/24/2014   Stroke (HCC) 06/2020   Vertigo     Past Surgical History:  Procedure Laterality Date   ANAL SPHINCTEROTOMY  04/2011   APPENDECTOMY  1980   AXILLARY LYMPH NODE DISSECTION Left 02/04/2013   Procedure: LEFT AXILLARY LYMPH NODE DISSECTION;  Surgeon: Jina Nephew, MD;  Location: [facility] OR;  Service: General;  Laterality: Left;  End: 1512   BREAST LUMPECTOMY WITH NEEDLE LOCALIZATION Left 02/04/2013   Procedure: LEFT BREAST LUMPECTOMY WITH NEEDLE LOCALIZATION;  Surgeon: Jina Nephew, MD;  Location: [facility] OR;  Service: General;  Laterality: Left;   BREAST SURGERY     Lumpectomy in april 2014   HEMORRHOID SURGERY  04/2011   ligation   MASTECTOMY Left 02/15/2017   PORT-A-CATH REMOVAL N/A 04/16/2014   Procedure: REMOVAL PORT-A-CATH;  Surgeon: Jina Nephew, MD;  Location: [facility];  Service: General;  Laterality: N/A;   PORTACATH PLACEMENT Right 02/04/2013   Procedure: INSERTION PORT-A-CATH;  Surgeon: Jina Nephew, MD;  Location: [facility] OR;  Service: General;  Laterality: Right;  Start Time: 8461.   SHOULDER ARTHROSCOPY WITH ROTATOR CUFF REPAIR AND SUBACROMIAL DECOMPRESSION Left 02/24/2014   Procedure: SHOULDER ARTHROSCOPY WITH ROTATOR CUFF REPAIR AND SUBACROMIAL DECOMPRESSION;  Surgeon: Cordella Glendia Hutchinson, MD;  Location: [facility] OR;  Service: Orthopedics;  Laterality: Left;  LEFT SHOULDER DIAGNOSTIC OPERATIVE ARTHROSCOPY, SUBACROMIAL DECOMPRESSION, ROTATOR CUFF TEAR REPAIR   SIMPLE MASTECTOMY WITH AXILLARY SENTINEL NODE BIOPSY Left 02/15/2017   Procedure: LEFT MASTECTOMY;  Surgeon: Jina Nephew, MD;  Location: [facility] OR;  Service: General;  Laterality: Left;   TOTAL MASTECTOMY Right 12/26/2018   Procedure: RIGHT  BREAST PROPHYLATIC MASTECTOMY;  Surgeon: Nephew Jina, MD;  Location: [facility] OR;  Service: General;  Laterality: Right;    Family History  Problem Relation Age of Onset   Lung cancer Father    Hypertension Father    Thyroid  cancer Father        dx in his 74s   Cancer Father        lung, thyroid , smoker   Breast cancer Paternal Aunt 30   Colon cancer Paternal Aunt        dx in her 50x   Cervical cancer Paternal Aunt        dzx in her 56s   Ovarian cancer Cousin        dx in her lage 61s   Breast cancer Cousin        maternal first cousin, once removed; dx in her late 28s   Breast cancer Cousin        maternal first cousin once removed; dx in late 16s   Hypertension Mother    Diabetes Mother    Dementia Mother  Memory loss Mother    Hypertension Brother    Seizures Brother        Alcohol induced.   Alcohol abuse Brother        drinker, smoker   COPD Brother    Cancer Paternal Uncle        oral cancer   Kidney cancer Paternal Grandmother    Arthritis Daughter        back surgery   Cancer Cousin        several paternal cousins with brain cancer, leukemia, and other cancers   Cancer Sister        stomach    Social History   Socioeconomic History   Marital status: Married    Spouse name: Garnette   Number of children: 1   Years of education: 12   Highest education level: Some college, no degree  Occupational History   Occupation: INVENTORY Office Manager: [employer]   Occupation: disability no longer working 2015  Tobacco Use   Smoking status: Never   Smokeless tobacco: Never  Vaping Use   Vaping status: Never Used  Substance and Sexual Activity   Alcohol use: No   Drug use: No   Sexual activity: Yes    Comment: lives with husband, disability/retirement. [employer], no dietary restrictions  Other Topics Concern   Not on file  Social History Narrative   Patient is married Wadie) and lives at home with her husband.   Patient has  one daughter   Patient drinks very little caffeine.   Left handed   Social Drivers of Health   Financial Resource Strain: Low Risk  (09/03/2024)   Overall Financial Resource Strain (CARDIA)    Difficulty of Paying Living Expenses: Not hard at all  Food Insecurity: No Food Insecurity (09/03/2024)   Hunger Vital Sign    Worried About Running Out of Food in the Last Year: Never true    Ran Out of Food in the Last Year: Never true  Transportation Needs: No Transportation Needs (09/03/2024)   PRAPARE - Administrator, Civil Service (Medical): No    Lack of Transportation (Non-Medical): No  Physical Activity: Sufficiently Active (09/03/2024)   Exercise Vital Sign    Days of Exercise per Week: 4 days    Minutes of Exercise per Session: 140 min  Stress: No Stress Concern Present (09/03/2024)   [employer] of Occupational Health - Occupational Stress Questionnaire    Feeling of Stress: Not at all  Social Connections: Socially Integrated (09/03/2024)   Social Connection and Isolation Panel    Frequency of Communication with Friends and Family: More than three times a week    Frequency of Social Gatherings with Friends and Family: Twice a week    Attends Religious Services: More than 4 times per year    Active Member of [employer] or Organizations: Yes    Attends Banker Meetings: Patient declined    Marital Status: Married  Catering Manager Violence: Not At Risk (08/02/2023)   Humiliation, Afraid, Rape, and Kick questionnaire    Fear of Current or Ex-Partner: No    Emotionally Abused: No    Physically Abused: No    Sexually Abused: No    Outpatient Medications Prior to Visit  Medication Sig Dispense Refill   Apoaequorin (PREVAGEN) 10 MG CAPS Take 1 capsule by mouth daily.     ARIPiprazole (ABILIFY) 5 MG tablet Take 5 mg by mouth at bedtime.  aspirin  EC 325 MG tablet Take 1 tablet (325 mg total) by mouth daily. 30 tablet 0   atorvastatin  (LIPITOR) 40 MG  tablet Take 1 tablet (40 mg total) by mouth daily. 90 tablet 1   Cholecalciferol 50 MCG (2000 UT) CAPS      gabapentin  (NEURONTIN ) 600 MG tablet Take 1 tablet (600 mg total) by mouth 2 (two) times daily. 60 tablet 3   hydrOXYzine  (ATARAX ) 50 MG tablet 1 ml as needed Orally twice daily     Multiple Vitamin (MULTIVITAMIN WITH MINERALS) TABS tablet Take 1 tablet by mouth daily.     traMADol  (ULTRAM ) 50 MG tablet Take 1 tablet (50 mg total) by mouth 2 (two) times daily as needed. 60 tablet 2   traZODone  (DESYREL ) 50 MG tablet 1 tablet at bedtime as needed Orally Once a day for 30 day(s)     valsartan  (DIOVAN ) 160 MG tablet TAKE 1 TABLET BY MOUTH EVERY DAY 90 tablet 1   gabapentin  (NEURONTIN ) 300 MG capsule Take 3 capsules (900 mg total) by mouth at bedtime. 90 capsule 3   HYDROcodone -acetaminophen  (NORCO) 10-325 MG tablet Take 1 tablet by mouth 4 (four) times daily as needed. Do Not fill Before 07/30/2024 120 tablet 0   anastrozole  (ARIMIDEX ) 1 MG tablet TAKE 1 TABLET BY MOUTH DAILY IN THE MORNING 90 tablet 2   No facility-administered medications prior to visit.    No Known Allergies  Review of Systems  Constitutional:  Negative for fever and malaise/fatigue.  HENT:  Negative for congestion.   Eyes:  Negative for blurred vision.  Respiratory:  Negative for shortness of breath.   Cardiovascular:  Negative for chest pain, palpitations and leg swelling.  Gastrointestinal:  Negative for abdominal pain, blood in stool and nausea.  Genitourinary:  Negative for dysuria and frequency.  Musculoskeletal:  Negative for falls.  Skin:  Negative for rash.  Neurological:  Negative for dizziness, loss of consciousness and headaches.  Endo/Heme/Allergies:  Negative for environmental allergies.  Psychiatric/Behavioral:  Negative for depression. The patient is not nervous/anxious.        Objective:    Physical Exam Constitutional:      General: She is not in acute distress.    Appearance: Normal  appearance. She is well-developed. She is not toxic-appearing.  HENT:     Head: Normocephalic and atraumatic.     Right Ear: External ear normal.     Left Ear: External ear normal.     Nose: Nose normal.  Eyes:     General:        Right eye: No discharge.        Left eye: No discharge.     Conjunctiva/sclera: Conjunctivae normal.  Neck:     Thyroid : No thyromegaly.  Cardiovascular:     Rate and Rhythm: Normal rate and regular rhythm.     Heart sounds: Normal heart sounds. No murmur heard. Pulmonary:     Effort: Pulmonary effort is normal. No respiratory distress.     Breath sounds: Normal breath sounds.  Abdominal:     General: Bowel sounds are normal.     Palpations: Abdomen is soft.     Tenderness: There is no abdominal tenderness. There is no guarding.  Musculoskeletal:        General: Normal range of motion.     Cervical back: Neck supple.  Lymphadenopathy:     Cervical: No cervical adenopathy.  Skin:    General: Skin is warm and dry.  Neurological:  Mental Status: She is alert and oriented to person, place, and time.  Psychiatric:        Mood and Affect: Mood normal.        Behavior: Behavior normal.        Thought Content: Thought content normal.        Judgment: Judgment normal.     BP (!) 146/80 (BP Location: Right Arm, Patient Position: Sitting, Cuff Size: Normal)   Pulse 71   Temp 98.1 F (36.7 C) (Oral)   Ht 5' (1.524 m)   Wt 176 lb 3.2 oz (79.9 kg)   LMP 01/20/2013   SpO2 97%   BMI 34.41 kg/m  Wt Readings from Last 3 Encounters:  09/04/24 176 lb (79.8 kg)  09/04/24 176 lb 3.2 oz (79.9 kg)  07/03/24 173 lb (78.5 kg)    Diabetic Foot Exam - Simple   No data filed    Lab Results  Component Value Date   WBC 5.3 09/04/2024   HGB 12.2 09/04/2024   HCT 38.1 09/04/2024   PLT 157.0 09/04/2024   GLUCOSE 91 09/04/2024   CHOL 121 09/04/2024   TRIG 72.0 09/04/2024   HDL 55.60 09/04/2024   LDLCALC 51 09/04/2024   ALT 19 09/04/2024   AST 24  09/04/2024   NA 141 09/04/2024   K 4.9 09/04/2024   CL 105 09/04/2024   CREATININE 1.09 09/04/2024   BUN 16 09/04/2024   CO2 30 09/04/2024   TSH 1.26 09/04/2024   INR 1.0 06/15/2020   HGBA1C 6.0 09/04/2024    Lab Results  Component Value Date   TSH 1.26 09/04/2024   Lab Results  Component Value Date   WBC 5.3 09/04/2024   HGB 12.2 09/04/2024   HCT 38.1 09/04/2024   MCV 87.8 09/04/2024   PLT 157.0 09/04/2024   Lab Results  Component Value Date   NA 141 09/04/2024   K 4.9 09/04/2024   CHLORIDE 104 01/22/2017   CO2 30 09/04/2024   GLUCOSE 91 09/04/2024   BUN 16 09/04/2024   CREATININE 1.09 09/04/2024   BILITOT 0.5 09/04/2024   ALKPHOS 103 09/04/2024   AST 24 09/04/2024   ALT 19 09/04/2024   PROT 7.3 09/04/2024   ALBUMIN 4.4 09/04/2024   CALCIUM  10.4 09/04/2024   ANIONGAP 11 06/21/2020   EGFR 55 (L) 02/16/2022   GFR 51.80 (L) 09/04/2024   Lab Results  Component Value Date   CHOL 121 09/04/2024   Lab Results  Component Value Date   HDL 55.60 09/04/2024   Lab Results  Component Value Date   LDLCALC 51 09/04/2024   Lab Results  Component Value Date   TRIG 72.0 09/04/2024   Lab Results  Component Value Date   CHOLHDL 2 09/04/2024   Lab Results  Component Value Date   HGBA1C 6.0 09/04/2024       Assessment & Plan:  Primary hypertension Assessment & Plan: Well controlled, no changes to meds. Encouraged heart healthy diet such as the DASH diet and exercise as tolerated.   Orders: -     CBC with Differential/Platelet -     TSH  Hyperglycemia Assessment & Plan: hgba1c acceptable, minimize simple carbs. Increase exercise as tolerated.   Orders: -     Comprehensive metabolic panel with GFR -     Hemoglobin A1c  Mixed hyperlipidemia Assessment & Plan: Encourage heart healthy diet such as MIND or DASH diet, increase exercise, avoid trans fats, simple carbohydrates and processed foods, consider a krill or  fish or flaxseed oil cap daily.    Orders: -     Lipid panel  Preventative health care Assessment & Plan: Patient encouraged to maintain heart healthy diet, regular exercise, adequate sleep. Consider daily probiotics. Take medications as prescribed. Labs ordered and reviewed.  Dexa 2022, normal repeat in 2027. MM 2020 s/p Total mastectomy no further mm need Paps patient has stopped but will let us  know if she reconsiders Colonoscopy 2021, told to repeat in 5 years. Requesting results Given documents for and discussed need for ACP to be completed. Patient agrees will let us  know if she has any further questions     Assessment and Plan Assessment & Plan Adult Wellness Visit She is generally doing well, engaging in regular physical activity, and maintaining hydration. Family history reviewed with no new concerns. Discussed the importance of hydration, protein intake, sleep, and vaccinations for overall health. Reviewed advanced directives and provided a packet for completion. Reviewed status of colonoscopy, mammogram, bone density scan, and Pap smear. - Encourage hydration with 5 ounces of water every hour. - Encourage protein intake every 3-4 hours. - Encourage regular physical activity with a goal of 4,000 steps daily. - Encourage 6-8 hours of sleep nightly. - Provide advanced directive packet for completion. - Encourage vaccinations: COVID, pneumococcal, RSV, tetanus, and shingles. - Review colonoscopy results from July 2021 and schedule next colonoscopy for 2026. - No further mammograms needed post-mastectomy. - Schedule next bone density scan for 2027. - Consider Pap smear if desired, refer to GYN if needed.  Primary hypertension Blood pressure management discussed. No current home monitoring. No symptoms such as headaches or chest pain reported. - Obtain [employer] home blood pressure cuff. - Check blood pressure weekly at rest for 10-15 minutes. - Target blood pressure: 100-140/60-90 mmHg with pulse 60-90  bpm.  Mixed hyperlipidemia No specific discussion regarding mixed hyperlipidemia management in this encounter.  Hyperglycemia No specific discussion regarding hyperglycemia management in this encounter.  Recording duration: 28 minutes     Harlene Horton, MD

## 2024-09-08 LAB — DRUG TOX MONITOR 1 W/CONF, ORAL FLD
Amphetamines: NEGATIVE ng/mL (ref <10)
Barbiturates: NEGATIVE ng/mL (ref <10)
Benzodiazepines: NEGATIVE ng/mL (ref <0.50)
Buprenorphine: NEGATIVE ng/mL (ref <0.10)
Cocaine: NEGATIVE ng/mL (ref <5.0)
Codeine: NEGATIVE ng/mL (ref <2.5)
Dihydrocodeine: 7.6 ng/mL — ABNORMAL HIGH (ref <2.5)
Fentanyl: NEGATIVE ng/mL (ref <0.10)
Heroin Metabolite: NEGATIVE ng/mL (ref <1.0)
Hydrocodone: 47.1 ng/mL — ABNORMAL HIGH (ref <2.5)
Hydromorphone: NEGATIVE ng/mL (ref <2.5)
MARIJUANA: NEGATIVE ng/mL (ref <2.5)
MDMA: NEGATIVE ng/mL (ref <10)
Meprobamate: NEGATIVE ng/mL (ref <2.5)
Methadone: NEGATIVE ng/mL (ref <5.0)
Morphine: NEGATIVE ng/mL (ref <2.5)
Nicotine Metabolite: NEGATIVE ng/mL (ref <5.0)
Norhydrocodone: NEGATIVE ng/mL (ref <2.5)
Noroxycodone: NEGATIVE ng/mL (ref <2.5)
Opiates: POSITIVE ng/mL — AB (ref <2.5)
Oxycodone: NEGATIVE ng/mL (ref <2.5)
Oxymorphone: NEGATIVE ng/mL (ref <2.5)
Phencyclidine: NEGATIVE ng/mL (ref <10)
Tapentadol: NEGATIVE ng/mL (ref <5.0)
Tramadol: 168.5 ng/mL — ABNORMAL HIGH (ref <5.0)
Tramadol: POSITIVE ng/mL — AB (ref <5.0)
Zolpidem: NEGATIVE ng/mL (ref <5.0)

## 2024-09-08 LAB — DRUG TOX ALC METAB W/CON, ORAL FLD: Alcohol Metabolite: NEGATIVE ng/mL (ref <25)

## 2024-09-18 ENCOUNTER — Ambulatory Visit (INDEPENDENT_AMBULATORY_CARE_PROVIDER_SITE_OTHER): Admitting: *Deleted

## 2024-09-18 VITALS — Ht 60.0 in | Wt 176.0 lb

## 2024-09-18 DIAGNOSIS — Z0000 Encounter for general adult medical examination without abnormal findings: Secondary | ICD-10-CM

## 2024-09-18 NOTE — Progress Notes (Signed)
 Please attest this visit in the absence of patient primary care provider.    Chief Complaint  Patient presents with   Medicare Wellness     Subjective:   Kristi [name] is a 68 y.o. female who presents for a Medicare Annual Wellness Visit.  Allergies (verified) Patient has no known allergies.   History: Past Medical History:  Diagnosis Date   Abnormal gait 12/17/2019   Acute blood loss anemia    Anal fissure 05/03/2011   Anal pain 04/27/2021   Anemia    Iron deficinecy anemia   Anemia 05/14/2017   Anxiety and depression 05/15/2014   Arthritis    Body mass index (BMI) 30.0-30.9, adult 12/17/2019   Breast cancer (HCC)    left ,last radiation 2'15, last chemo 8'14   Cataracts, bilateral 2025   [facility]   Cervical cancer screening 06/18/2018   Menarche at 11 Regular and moderate flow  history of abnormal pap in past, 1 mild abnormality years ago that resolved spontaneously on recheck G1P1, s/p 1 svd history of abnormal MGM, b/o breast cancer 2014 No concerns today  gyn surgeries. Lumpectomy  LMP around early 2014   Cervical stenosis of spine 12/17/2019   Constipation 11/13/2016   Contracture of axilla 05/20/2013   Debility 01/18/2020   Essential (primary) hypertension 12/17/2019   Flushing 02/20/2014   Gait instability 12/17/2019   Genetic testing 02/20/2019   Negative genetic testing on the Comprehensive Cancer Panel.  The Comprehensive Common Cancer Panel offered by [facility] includes sequencing and/or deletion duplication testing of the following 46 genes: APC, ATM, AXIN2, BAP1, BARD1, BMPR1A, BRCA1, BRCA2, BRIP1, CDH1, CDK4, CDKN2A, CHEK2, EPCAM, FANCC, FH, FLCN, HOXB13, MET, MITF,  MLH1, MSH2, MSH6, MUTYH, NBN, NF1, NTHL1,  PALB2, PMS2, POLD1, POLE, P   H/O: CVA (cerebrovascular accident) 06/19/2020   Hereditary and idiopathic peripheral neuropathy 11/11/2013   Herpes zoster 05/15/2014   History of chicken pox    History of left breast cancer 02/15/2017   History of  radiation therapy 09/09/13-10/28/13   45 gray to left breast, lumpectomy cavity boosted to 63 gray   HTN (hypertension) 11/13/2016   Hyperglycemia 01/09/2016   Hyperlipidemia 05/14/2017   Hypertension    Internal hemorrhoids without complication 05/03/2011   Knee pain, bilateral 11/14/2016   Left upper extremity numbness 06/19/2018   Major depressive disorder, recurrent episode, severe (HCC) 01/12/2014   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 Overview:   Left IDC, ER 3%, PR-, Her2neu-  Last Assessment & Plan:  Pt will continue to take her tamoxifen  as previously directed. She appears to be tolerating this with only c/o moderate, occ hot flashes.   Mild neurocognitive disorder due to multiple etiologies 03/17/2020   MRSA (methicillin resistant Staphylococcus aureus) 2009   right groin area-no issues now. 04-07-14 PCR screen negative today.   Neuropathy    Neuropathy of hand, left 09/21/2015   Nodule of finger of left hand 11/14/2016   Palpitations 07/30/2020   Personal history of colonic polyps 04/27/2021   Preventative health care 11/13/2016   Right hip pain 11/14/2016   S/P mastectomy, right 12/26/2018   Sebaceous cyst 04/12/2013   Shoulder joint pain 12/12/2013   Spinal stenosis in cervical region 10/09/2019   Strain of rotator cuff 02/24/2014   Stroke (HCC) 06/2020   Vertigo    Past Surgical History:  Procedure Laterality  Date   ANAL SPHINCTEROTOMY  04/2011   APPENDECTOMY  1980   AXILLARY LYMPH NODE DISSECTION Left 02/04/2013   Procedure: LEFT AXILLARY LYMPH NODE DISSECTION;  Surgeon: Jina Nephew, MD;  Location: [facility] OR;  Service: General;  Laterality: Left;  End: 1512   BREAST LUMPECTOMY WITH NEEDLE LOCALIZATION Left 02/04/2013   Procedure: LEFT BREAST LUMPECTOMY WITH NEEDLE LOCALIZATION;  Surgeon: Jina Nephew, MD;  Location: [facility] OR;  Service: General;   Laterality: Left;   BREAST SURGERY     Lumpectomy in april 2014   HEMORRHOID SURGERY  04/2011   ligation   MASTECTOMY Left 02/15/2017   PORT-A-CATH REMOVAL N/A 04/16/2014   Procedure: REMOVAL PORT-A-CATH;  Surgeon: Jina Nephew, MD;  Location: [facility];  Service: General;  Laterality: N/A;   PORTACATH PLACEMENT Right 02/04/2013   Procedure: INSERTION PORT-A-CATH;  Surgeon: Jina Nephew, MD;  Location: [facility] OR;  Service: General;  Laterality: Right;  Start Time: 8461.   SHOULDER ARTHROSCOPY WITH ROTATOR CUFF REPAIR AND SUBACROMIAL DECOMPRESSION Left 02/24/2014   Procedure: SHOULDER ARTHROSCOPY WITH ROTATOR CUFF REPAIR AND SUBACROMIAL DECOMPRESSION;  Surgeon: Cordella Glendia Hutchinson, MD;  Location: [facility];  Service: Orthopedics;  Laterality: Left;  LEFT SHOULDER DIAGNOSTIC OPERATIVE ARTHROSCOPY, SUBACROMIAL DECOMPRESSION, ROTATOR CUFF TEAR REPAIR   SIMPLE MASTECTOMY WITH AXILLARY SENTINEL NODE BIOPSY Left 02/15/2017   Procedure: LEFT MASTECTOMY;  Surgeon: Jina Nephew, MD;  Location: [facility] OR;  Service: General;  Laterality: Left;   TOTAL MASTECTOMY Right 12/26/2018   Procedure: RIGHT BREAST PROPHYLATIC MASTECTOMY;  Surgeon: Nephew Jina, MD;  Location: [facility] OR;  Service: General;  Laterality: Right;   Family History  Problem Relation Age of Onset   Lung cancer Father    Hypertension Father    Thyroid  cancer Father        dx in his 34s   Cancer Father        lung, thyroid , smoker   Breast cancer Paternal Aunt 30   Colon cancer Paternal Aunt        dx in her 50x   Cervical cancer Paternal Aunt        dzx in her 34s   Ovarian cancer Cousin        dx in her lage 73s   Breast cancer Cousin        maternal first cousin, once removed; dx in her late 65s   Breast cancer Cousin        maternal first cousin once removed; dx in late 71s   Hypertension Mother    Diabetes Mother    Dementia Mother    Memory loss Mother    Hypertension Brother    Seizures Brother        Alcohol induced.   Alcohol abuse  Brother        drinker, smoker   COPD Brother    Cancer Paternal Uncle        oral cancer   Kidney cancer Paternal Grandmother    Arthritis Daughter        back surgery   Cancer Cousin        several paternal cousins with brain cancer, leukemia, and other cancers   Cancer Sister        stomach   Social History   Occupational History   Occupation: INVENTORY Office Manager: [employer]   Occupation: disability no longer working 2015  Tobacco Use   Smoking status: Never   Smokeless tobacco: Never  Vaping Use  Vaping status: Never Used  Substance and Sexual Activity   Alcohol use: No   Drug use: No   Sexual activity: Yes    Comment: lives with husband, disability/retirement. [employer], no dietary restrictions   Tobacco Counseling Counseling given: Not Answered  SDOH Screenings   Food Insecurity: No Food Insecurity (09/18/2024)  Housing: Low Risk  (09/18/2024)  Transportation Needs: No Transportation Needs (09/18/2024)  Utilities: Not At Risk (09/18/2024)  Alcohol Screen: Low Risk  (08/02/2023)  Depression (PHQ2-9): Low Risk  (09/18/2024)  Financial Resource Strain: Low Risk  (09/03/2024)  Physical Activity: Sufficiently Active (09/18/2024)  Social Connections: Socially Integrated (09/18/2024)  Stress: No Stress Concern Present (09/18/2024)  Tobacco Use: Low Risk  (09/18/2024)  Health Literacy: Adequate Health Literacy (08/02/2023)   See flowsheets for full screening details  Depression Screen PHQ 2 & 9 Depression Scale- Over the past 2 weeks, how often have you been bothered by any of the following problems? Little interest or pleasure in doing things: 0 Feeling down, depressed, or hopeless (PHQ Adolescent also includes...irritable): 0 PHQ-2 Total Score: 0 Trouble falling or staying asleep, or sleeping too much: 0 Feeling tired or having little energy: 0 Poor appetite or overeating (PHQ Adolescent also includes...weight loss): 0 Feeling  bad about yourself - or that you are a failure or have let yourself or your family down: 0 Trouble concentrating on things, such as reading the newspaper or watching television (PHQ Adolescent also includes...like school work): 0 Moving or speaking so slowly that other people could have noticed. Or the opposite - being so fidgety or restless that you have been moving around a lot more than usual: 0 Thoughts that you would be better off dead, or of hurting yourself in some way: 0 PHQ-9 Total Score: 0 If you checked off any problems, how difficult have these problems made it for you to do your work, take care of things at home, or get along with other people?: Not difficult at all     Goals Addressed             This Visit's Progress    Increase physical activity   On track      Visit info / Clinical Intake: Medicare Wellness Visit Type:: Subsequent Annual Wellness Visit Persons participating in visit:: patient Medicare Wellness Visit Mode:: Telephone If telephone:: video declined If Telephone or Video please confirm:: I connected with the patient using audio enabled telemedicine application and verified that I am speaking with the correct person using two identifiers; I discussed the limitations of evaluation and management by telemedicine; The patient expressed understanding and agreed to proceed Patient Location:: home Provider Location:: office Information given by:: patient Interpreter Needed?: No Pre-visit prep was completed: yes AWV questionnaire completed by patient prior to visit?: no Living arrangements:: lives with spouse/significant other Patient's Overall Health Status Rating: very good Typical amount of pain: some (has neuropathy and lymphedema) Does pain affect daily life?: no (tries to keep going) Are you currently prescribed opioids?: (!) yes (Takes tramadol , hydrocodone  and gabapentin )  Dietary Habits and Nutritional Risks How many meals a day?: 2 Eats fruit and  vegetables daily?: yes Most meals are obtained by: preparing own meals; eating out In the last 2 weeks, have you had any of the following?: none Diabetic:: no  Functional Status Activities of Daily Living (to include ambulation/medication): Independent Ambulation: Independent Medication Administration: Independent Home Management: Independent Manage your own finances?: yes Primary transportation is: driving Concerns about vision?: no *vision screening  is required for WTM* (up to date with [facility]) Concerns about hearing?: no  Fall Screening Falls in the past year?: 0 Number of falls in past year: 0 Was there an injury with Fall?: 0 Fall Risk Category Calculator: 0 Patient Fall Risk Level: Low Fall Risk  Fall Risk Patient at Risk for Falls Due to: Impaired balance/gait; Impaired vision; Orthopedic patient Fall risk Follow up: Falls evaluation completed  Home and Transportation Safety: All rugs have non-skid backing?: yes All stairs or steps have railings?: yes (avoids steps if possible) Grab bars in the bathtub or shower?: yes Have non-skid surface in bathtub or shower?: yes Good home lighting?: yes Regular seat belt use?: yes Hospital stays in the last year:: no  Cognitive Assessment Difficulty concentrating, remembering, or making decisions? : no Will 6CIT or Mini Cog be Completed: yes What year is it?: 0 points What month is it?: 0 points Give patient an address phrase to remember (5 components): [address] [place]  About what time is it?: 0 points Count backwards from 20 to 1: 0 points Say the months of the year in reverse: 0 points Repeat the address phrase from earlier: 0 points 6 CIT Score: 0 points  Advance Directives (For Healthcare) Does Patient Have a Medical Advance Directive?: No Would patient like information on creating a medical advance directive?: No - Patient declined  Reviewed/Updated  Reviewed/Updated: Reviewed All (Medical,  Surgical, Family, Medications, Allergies, Care Teams, Patient Goals)        Objective:    Today's Vitals   09/18/24 1343  Weight: 176 lb (79.8 kg)  Height: 5' (1.524 m)   Body mass index is 34.37 kg/m.  Current Medications (verified) Outpatient Encounter Medications as of 09/18/2024  Medication Sig   anastrozole  (ARIMIDEX ) 1 MG tablet TAKE 1 TABLET BY MOUTH DAILY IN THE MORNING   Apoaequorin (PREVAGEN) 10 MG CAPS Take 1 capsule by mouth daily.   ARIPiprazole (ABILIFY) 5 MG tablet Take 5 mg by mouth at bedtime.   aspirin  EC 325 MG tablet Take 1 tablet (325 mg total) by mouth daily.   atorvastatin  (LIPITOR) 40 MG tablet Take 1 tablet (40 mg total) by mouth daily.   Cholecalciferol 50 MCG (2000 UT) CAPS    gabapentin  (NEURONTIN ) 300 MG capsule Take 3 capsules (900 mg total) by mouth at bedtime.   gabapentin  (NEURONTIN ) 600 MG tablet Take 1 tablet (600 mg total) by mouth 2 (two) times daily.   HYDROcodone -acetaminophen  (NORCO) 10-325 MG tablet Take 1 tablet by mouth 4 (four) times daily as needed. Do Not fill Before 10/09/2024   hydrOXYzine  (ATARAX ) 50 MG tablet 1 ml as needed Orally twice daily   Multiple Vitamin (MULTIVITAMIN WITH MINERALS) TABS tablet Take 1 tablet by mouth daily.   traMADol  (ULTRAM ) 50 MG tablet Take 1 tablet (50 mg total) by mouth 2 (two) times daily as needed.   traZODone  (DESYREL ) 50 MG tablet 1 tablet at bedtime as needed Orally Once a day for 30 day(s)   valsartan  (DIOVAN ) 160 MG tablet TAKE 1 TABLET BY MOUTH EVERY DAY   No facility-administered encounter medications on file as of 09/18/2024.   Hearing/Vision screen No results found. Immunizations and Health Maintenance Health Maintenance  Topic Date Due   Hepatitis C Screening  Never done   DTaP/Tdap/Td (1 - Tdap) Never done   Zoster Vaccines- Shingrix (1 of 2) Never done   Pneumococcal Vaccine: 50+ Years (1 of 1 - PCV) Never done   COVID-19 Vaccine (  7 - 2025-26 season) 07/07/2024   Colonoscopy   02/25/2025   Medicare Annual Wellness (AWV)  09/18/2025   Influenza Vaccine  Completed   DEXA SCAN  Completed   Meningococcal B Vaccine  Aged Out   Mammogram  Discontinued        Assessment/Plan:  This is a routine wellness examination for Chakira.  Patient Care Team: Domenica Harlene LABOR, MD as PCP - General (Family Medicine) Odean Potts, MD as Consulting Physician (Hematology and Oncology) Ilah Crigler, MD as Consulting Physician (Family Medicine) Christine Rush, DPM as Consulting Physician (Podiatry) Shannon Agent, MD as Consulting Physician (Radiation Oncology) Barbaraann Sonny BROCKS as Consulting Physician (Nurse Practitioner) Debby Fidela CROME, NP as Nurse Practitioner (Physical Medicine and Rehabilitation)  I have personally reviewed and noted the following in the patient's chart:   Medical and social history Use of alcohol, tobacco or illicit drugs  Current medications and supplements including opioid prescriptions. Functional ability and status Nutritional status Physical activity Advanced directives List of other physicians Hospitalizations, surgeries, and ER visits in previous 12 months Vitals Screenings to include cognitive, depression, and falls Referrals and appointments  No orders of the defined types were placed in this encounter.  In addition, I have reviewed and discussed with patient certain preventive protocols, quality metrics, and best practice recommendations. A written personalized care plan for preventive services as well as general preventive health recommendations were provided to patient.   Lolita Libra, CMA   09/18/2024   Return in 1 year (on 09/18/2025).  After Visit Summary: (MyChart) Due to this being a telephonic visit, the after visit summary with patients personalized plan was offered to patient via MyChart   Nurse Notes: nothing significant to report

## 2024-09-18 NOTE — Patient Instructions (Addendum)
 Kristi [name],  Thank you for taking the time for your Medicare Wellness Visit. I appreciate your continued commitment to your health goals. Please review the care plan we discussed, and feel free to reach out if I can assist you further.  Please note that Annual Wellness Visits do not include a physical exam. Some assessments may be limited, especially if the visit was conducted virtually. If needed, we may recommend an in-person follow-up with your provider.  GOALS:  Ongoing Care Seeing your primary care provider every 3 to 6 months helps us  monitor your health and provide consistent, personalized care.   Dr Domenica:  03/05/25 11am Annual Wellness Visit: 09/23/25 1pm, telephone   Recommended Screenings:  You will need to get the following vaccines at your local pharmacy: COVID, Tetanus, Shingles, Pneumonia  Health Maintenance  Topic Date Due   Hepatitis C Screening  Never done   DTaP/Tdap/Td vaccine (1 - Tdap) Never done   Zoster (Shingles) Vaccine (1 of 2) Never done   Pneumococcal Vaccine for age over 76 (1 of 1 - PCV) Never done   COVID-19 Vaccine (7 - 2025-26 season) 07/07/2024   Medicare Annual Wellness Visit  08/01/2024   Colon Cancer Screening  02/25/2025   Flu Shot  Completed   DEXA scan (bone density measurement)  Completed   Meningitis B Vaccine  Aged Out   Breast Cancer Screening  Discontinued       09/18/2024    1:44 PM  Advanced Directives  Does Patient Have a Medical Advance Directive? No  Would patient like information on creating a medical advance directive? No - Patient declined  Once completed and notarized, you may return a copy of your Advanced Directive(s) by either of the following:  Bring a copy of your health care power of attorney and living will to the office to be added to your chart at your convenience. You can mail a copy to [facility] [address] [place], [place] [address] or email to [email or web address]   Vision:  Annual vision screenings are recommended for early detection of glaucoma, cataracts, and diabetic retinopathy. These exams can also reveal signs of chronic conditions such as diabetes and high blood pressure.  Dental: Annual dental screenings help detect early signs of oral cancer, gum disease, and other conditions linked to overall health, including heart disease and diabetes.  Please see the attached documents for additional preventive care recommendations.

## 2024-10-03 ENCOUNTER — Telehealth: Payer: Self-pay | Admitting: Pharmacist

## 2024-10-03 MED ORDER — ATORVASTATIN CALCIUM 40 MG PO TABS: 40.0000 mg | ORAL_TABLET | Freq: Every day | ORAL | 0 refills | Status: AC

## 2024-10-03 NOTE — Progress Notes (Signed)
 Pharmacy Quality Measure Review  This patient is appearing on a report for being at risk of failing the adherence measure for cholesterol (statin) medications this calendar year.   Medication: atorvastatin   Last fill date: 05/05/2024 for 90 day supply  Reviewed recent refill history in Dr Annemarie database. Patient has 1 refill remaining. Next appointment with PCP is 02/2025.   Spoke with patient and she did need refill for atorvastatin . Coordinated with [facility] to fill Rx today. Also sent in Rx for atorvastatin  for #100 / 0 reflls to last until her next PCP appointment.   Madelin Ray, PharmD Clinical Pharmacist [facility]  Population Health [phone]

## 2024-11-04 ENCOUNTER — Encounter: Attending: Registered Nurse | Admitting: Registered Nurse

## 2024-11-04 VITALS — BP 135/80 | HR 58 | Ht 60.0 in | Wt 175.0 lb

## 2024-11-04 DIAGNOSIS — M25512 Pain in left shoulder: Secondary | ICD-10-CM | POA: Diagnosis present

## 2024-11-04 DIAGNOSIS — R001 Bradycardia, unspecified: Secondary | ICD-10-CM | POA: Insufficient documentation

## 2024-11-04 DIAGNOSIS — G8929 Other chronic pain: Secondary | ICD-10-CM | POA: Insufficient documentation

## 2024-11-04 DIAGNOSIS — Z5181 Encounter for therapeutic drug level monitoring: Secondary | ICD-10-CM | POA: Insufficient documentation

## 2024-11-04 DIAGNOSIS — Z79891 Long term (current) use of opiate analgesic: Secondary | ICD-10-CM | POA: Insufficient documentation

## 2024-11-04 DIAGNOSIS — T451X5A Adverse effect of antineoplastic and immunosuppressive drugs, initial encounter: Secondary | ICD-10-CM | POA: Diagnosis present

## 2024-11-04 DIAGNOSIS — G894 Chronic pain syndrome: Secondary | ICD-10-CM | POA: Diagnosis present

## 2024-11-04 DIAGNOSIS — G620 Drug-induced polyneuropathy: Secondary | ICD-10-CM | POA: Diagnosis present

## 2024-11-04 MED ORDER — TRAMADOL HCL 50 MG PO TABS: 50.0000 mg | ORAL_TABLET | Freq: Two times a day (BID) | ORAL | 2 refills | Status: AC | PRN

## 2024-11-04 NOTE — Progress Notes (Unsigned)
 "  Subjective:    Patient ID: Kristi [name], female    DOB: Apr 15, 1956, 68 y.o.   MRN: [id number]  HPI: Kristi [name] is a 68 y.o. female who returns for follow up appointment for chronic pain and medication refill. states *** pain is located in  ***. rates pain ***. current exercise regime is walking and performing stretching exercises.  Kristi [name] Morphine  equivalent is *** MME.   Last Oral Swab was performed on 09/04/2024, it was consistent.      Pain Inventory Average Pain 4 Pain Right Now 3 My pain is burning, tingling, and aching  In the last 24 hours, has pain interfered with the following? General activity 2 Relation with others 2 Enjoyment of life 2 What TIME of day is your pain at its worst? varies Sleep (in general) Good  Pain is worse with: walking, sitting, inactivity, and standing Pain improves with: pacing activities and medication Relief from Meds: 6  Family History  Problem Relation Age of Onset   Lung cancer Father    Hypertension Father    Thyroid  cancer Father        dx in his 58s   Cancer Father        lung, thyroid , smoker   Breast cancer Paternal Aunt 29   Colon cancer Paternal Aunt        dx in her 50x   Cervical cancer Paternal Aunt        dzx in her 1s   Ovarian cancer Cousin        dx in her lage 46s   Breast cancer Cousin        maternal first cousin, once removed; dx in her late 45s   Breast cancer Cousin        maternal first cousin once removed; dx in late 61s   Hypertension Mother    Diabetes Mother    Dementia Mother    Memory loss Mother    Hypertension Brother    Seizures Brother        Alcohol induced.   Alcohol abuse Brother        drinker, smoker   COPD Brother    Cancer Paternal Uncle        oral cancer   Kidney cancer Paternal Grandmother    Arthritis Daughter        back surgery   Cancer Cousin        several paternal cousins with brain cancer, leukemia, and other cancers   Cancer Sister        stomach    Social History   Socioeconomic History   Marital status: Married    Spouse name: Kristi [name]   Number of children: 1   Years of education: 12   Highest education level: Some college, no degree  Occupational History   Occupation: INVENTORY Office Manager: [employer]   Occupation: disability no longer working 2015  Tobacco Use   Smoking status: Never   Smokeless tobacco: Never  Vaping Use   Vaping status: Never Used  Substance and Sexual Activity   Alcohol use: No   Drug use: No   Sexual activity: Yes    Comment: lives with husband, disability/retirement. [employer], no dietary restrictions  Other Topics Concern   Not on file  Social History Narrative   Patient is married Kristi [name]) and lives at home with her husband.   Patient has one daughter   Patient drinks very  little caffeine.   Left handed   Social Drivers of Health   Tobacco Use: Low Risk (09/18/2024)   Patient History    Smoking Tobacco Use: Never    Smokeless Tobacco Use: Never    Passive Exposure: Not on file  Financial Resource Strain: Low Risk (09/03/2024)   Overall Financial Resource Strain (CARDIA)    Difficulty of Paying Living Expenses: Not hard at all  Food Insecurity: No Food Insecurity (09/18/2024)   Epic    Worried About Programme Researcher, Broadcasting/film/video in the Last Year: Never true    Ran Out of Food in the Last Year: Never true  Transportation Needs: No Transportation Needs (09/18/2024)   Epic    Lack of Transportation (Medical): No    Lack of Transportation (Non-Medical): No  Physical Activity: Sufficiently Active (09/18/2024)   Exercise Vital Sign    Days of Exercise per Week: 4 days    Minutes of Exercise per Session: 140 min  Stress: No Stress Concern Present (09/18/2024)   [employer] of Occupational Health - Occupational Stress Questionnaire    Feeling of Stress: Not at all  Social Connections: Socially Integrated (09/18/2024)   Social Connection and Isolation Panel     Frequency of Communication with Friends and Family: More than three times a week    Frequency of Social Gatherings with Friends and Family: Twice a week    Attends Religious Services: More than 4 times per year    Active Member of Clubs or Organizations: Yes    Attends Banker Meetings: More than 4 times per year    Marital Status: Married  Depression (PHQ2-9): Low Risk (09/18/2024)   Depression (PHQ2-9)    PHQ-2 Score: 0  Alcohol Screen: Low Risk (08/02/2023)   Alcohol Screen    Last Alcohol Screening Score (AUDIT): 0  Housing: Low Risk (09/18/2024)   Epic    Unable to Pay for Housing in the Last Year: No    Number of Times Moved in the Last Year: 0    Homeless in the Last Year: No  Utilities: Not At Risk (09/18/2024)   Epic    Threatened with loss of utilities: No  Health Literacy: Adequate Health Literacy (08/02/2023)   B1300 Health Literacy    Frequency of need for help with medical instructions: Never   Past Surgical History:  Procedure Laterality Date   ANAL SPHINCTEROTOMY  04/2011   APPENDECTOMY  1980   AXILLARY LYMPH NODE DISSECTION Left 02/04/2013   Procedure: LEFT AXILLARY LYMPH NODE DISSECTION;  Surgeon: Jina Nephew, MD;  Location: [facility];  Service: General;  Laterality: Left;  End: 1512   BREAST LUMPECTOMY WITH NEEDLE LOCALIZATION Left 02/04/2013   Procedure: LEFT BREAST LUMPECTOMY WITH NEEDLE LOCALIZATION;  Surgeon: Jina Nephew, MD;  Location: [facility];  Service: General;  Laterality: Left;   BREAST SURGERY     Lumpectomy in april 2014   HEMORRHOID SURGERY  04/2011   ligation   MASTECTOMY Left 02/15/2017   PORT-A-CATH REMOVAL N/A 04/16/2014   Procedure: REMOVAL PORT-A-CATH;  Surgeon: Jina Nephew, MD;  Location: [facility];  Service: General;  Laterality: N/A;   PORTACATH PLACEMENT Right 02/04/2013   Procedure: INSERTION PORT-A-CATH;  Surgeon: Jina Nephew, MD;  Location: [facility];  Service: General;  Laterality: Right;  Start Time: 8461.   SHOULDER ARTHROSCOPY WITH  ROTATOR CUFF REPAIR AND SUBACROMIAL DECOMPRESSION Left 02/24/2014   Procedure: SHOULDER ARTHROSCOPY WITH ROTATOR CUFF REPAIR AND SUBACROMIAL DECOMPRESSION;  Surgeon: Cordella Glendia Hutchinson, MD;  Location: [facility];  Service: Orthopedics;  Laterality: Left;  LEFT SHOULDER DIAGNOSTIC OPERATIVE ARTHROSCOPY, SUBACROMIAL DECOMPRESSION, ROTATOR CUFF TEAR REPAIR   SIMPLE MASTECTOMY WITH AXILLARY SENTINEL NODE BIOPSY Left 02/15/2017   Procedure: LEFT MASTECTOMY;  Surgeon: Jina Nephew, MD;  Location: [facility];  Service: General;  Laterality: Left;   TOTAL MASTECTOMY Right 12/26/2018   Procedure: RIGHT BREAST PROPHYLATIC MASTECTOMY;  Surgeon: Nephew Jina, MD;  Location: [facility];  Service: General;  Laterality: Right;   Past Surgical History:  Procedure Laterality Date   ANAL SPHINCTEROTOMY  04/2011   APPENDECTOMY  1980   AXILLARY LYMPH NODE DISSECTION Left 02/04/2013   Procedure: LEFT AXILLARY LYMPH NODE DISSECTION;  Surgeon: Jina Nephew, MD;  Location: [facility];  Service: General;  Laterality: Left;  End: 1512   BREAST LUMPECTOMY WITH NEEDLE LOCALIZATION Left 02/04/2013   Procedure: LEFT BREAST LUMPECTOMY WITH NEEDLE LOCALIZATION;  Surgeon: Jina Nephew, MD;  [facility];  Service: General;  Laterality: Left;   BREAST SURGERY     Lumpectomy in april 2014   HEMORRHOID SURGERY  04/2011   ligation   MASTECTOMY Left 02/15/2017   PORT-A-CATH REMOVAL N/A 04/16/2014   Procedure: REMOVAL PORT-A-CATH;  Surgeon: Jina Nephew, MD;  Location: [facility];  Service: General;  Laterality: N/A;   PORTACATH PLACEMENT Right 02/04/2013   Procedure: INSERTION PORT-A-CATH;  Surgeon: Jina Nephew, MD;  [facility];  Service: General;  Laterality: Right;  Start Time: 8461.   SHOULDER ARTHROSCOPY WITH ROTATOR CUFF REPAIR AND SUBACROMIAL DECOMPRESSION Left 02/24/2014   Procedure: SHOULDER ARTHROSCOPY WITH ROTATOR CUFF REPAIR AND SUBACROMIAL DECOMPRESSION;  Surgeon: Cordella Glendia Hutchinson, MD;  Location: [facility] OR;  Service: Orthopedics;  Laterality:  Left;  LEFT SHOULDER DIAGNOSTIC OPERATIVE ARTHROSCOPY, SUBACROMIAL DECOMPRESSION, ROTATOR CUFF TEAR REPAIR   SIMPLE MASTECTOMY WITH AXILLARY SENTINEL NODE BIOPSY Left 02/15/2017   Procedure: LEFT MASTECTOMY;  Surgeon: Jina Nephew, MD;  [facility]: [facility];  Service: General;  Laterality: Left;   TOTAL MASTECTOMY Right 12/26/2018   Procedure: RIGHT BREAST PROPHYLATIC MASTECTOMY;  Surgeon: Nephew Jina, MD;  Location: [facility];  Service: General;  Laterality: Right;   Past Medical History:  Diagnosis Date   Abnormal gait 12/17/2019   Acute blood loss anemia    Anal fissure 05/03/2011   Anal pain 04/27/2021   Anemia    Iron deficinecy anemia   Anemia 05/14/2017   Anxiety and depression 05/15/2014   Arthritis    Body mass index (BMI) 30.0-30.9, adult 12/17/2019   Breast cancer (HCC)    left ,last radiation 2'15, last chemo 8'14   Cataracts, bilateral 2025   [facility]   Cervical cancer screening 06/18/2018   Menarche at 11 Regular and moderate flow  history of abnormal pap in past, 1 mild abnormality years ago that resolved spontaneously on recheck G1P1, s/p 1 svd history of abnormal MGM, b/o breast cancer 2014 No concerns today  gyn surgeries. Lumpectomy  LMP around early 2014   Cervical stenosis of spine 12/17/2019   Constipation 11/13/2016   Contracture of axilla 05/20/2013   Debility 01/18/2020   Essential (primary) hypertension 12/17/2019   Flushing 02/20/2014   Gait instability 12/17/2019   Genetic testing 02/20/2019   Negative genetic testing on the Comprehensive Cancer Panel.  The Comprehensive Common Cancer Panel offered by [facility] includes sequencing and/or deletion duplication testing of the following 46 genes: APC, ATM, AXIN2, BAP1, BARD1, BMPR1A, BRCA1, BRCA2, BRIP1, CDH1, CDK4, CDKN2A, CHEK2, EPCAM, FANCC, FH, FLCN, HOXB13, MET, MITF,  MLH1, MSH2, MSH6, MUTYH, NBN,  NF1, NTHL1,  PALB2, PMS2, POLD1, POLE, P   H/O: CVA (cerebrovascular accident) 06/19/2020   Hereditary and idiopathic  peripheral neuropathy 11/11/2013   Herpes zoster 05/15/2014   History of chicken pox    History of left breast cancer 02/15/2017   History of radiation therapy 09/09/13-10/28/13   45 gray to left breast, lumpectomy cavity boosted to 63 gray   HTN (hypertension) 11/13/2016   Hyperglycemia 01/09/2016   Hyperlipidemia 05/14/2017   Hypertension    Internal hemorrhoids without complication 05/03/2011   Knee pain, bilateral 11/14/2016   Left upper extremity numbness 06/19/2018   Major depressive disorder, recurrent episode, severe (HCC) 01/12/2014   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 Overview:   Left IDC, ER 3%, PR-, Her2neu-  Last Assessment & Plan:  Pt will continue to take her tamoxifen  as previously directed. She appears to be tolerating this with only c/o moderate, occ hot flashes.   Mild neurocognitive disorder due to multiple etiologies 03/17/2020   MRSA (methicillin resistant Staphylococcus aureus) 2009   right groin area-no issues now. 04-07-14 PCR screen negative today.   Neuropathy    Neuropathy of hand, left 09/21/2015   Nodule of finger of left hand 11/14/2016   Palpitations 07/30/2020   Personal history of colonic polyps 04/27/2021   Preventative health care 11/13/2016   Right hip pain 11/14/2016   S/P mastectomy, right 12/26/2018   Sebaceous cyst 04/12/2013   Shoulder joint pain 12/12/2013   Spinal stenosis in cervical region 10/09/2019   Strain of rotator cuff 02/24/2014   Stroke (HCC) 06/2020   Vertigo    LMP 01/20/2013   Opioid Risk Score:   Fall Risk Score:  `1  Depression screen PHQ 2/9     09/18/2024    1:55 PM 09/04/2024    1:06 PM 09/04/2024   11:20 AM 03/27/2024    1:22 PM 01/31/2024   12:59 PM 11/22/2023    1:13 PM 08/02/2023    2:25 PM  Depression screen PHQ 2/9  Decreased Interest 0 0 0 0 0 0 0  Down, Depressed,  Hopeless 0 0 0 0 0 0 0  PHQ - 2 Score 0 0 0 0 0 0 0  Altered sleeping 0  0      Tired, decreased energy 0  0      Change in appetite 0  0      Feeling bad or failure about yourself  0  0      Trouble concentrating 0  0      Moving slowly or fidgety/restless 0  0      Suicidal thoughts 0  0      PHQ-9 Score 0  0       Difficult doing work/chores   Not difficult at all         Data saved with a previous flowsheet row definition    Review of Systems  Musculoskeletal:        Left shoulde pain B/L foot pain  All other systems reviewed and are negative.      Objective:   Physical Exam        Assessment & Plan:    "

## 2024-11-05 ENCOUNTER — Encounter: Payer: Self-pay | Admitting: Registered Nurse

## 2024-11-25 ENCOUNTER — Encounter: Payer: Self-pay | Admitting: Family Medicine

## 2024-11-27 NOTE — Telephone Encounter (Signed)
 Called Pt, pt stated will call back in March to establish care with MD.

## 2024-12-10 ENCOUNTER — Telehealth: Payer: Self-pay | Admitting: Registered Nurse

## 2024-12-10 DIAGNOSIS — G894 Chronic pain syndrome: Secondary | ICD-10-CM

## 2024-12-10 DIAGNOSIS — G620 Drug-induced polyneuropathy: Secondary | ICD-10-CM

## 2024-12-10 MED ORDER — HYDROCODONE-ACETAMINOPHEN 10-325 MG PO TABS: 1.0000 | ORAL_TABLET | Freq: Four times a day (QID) | ORAL | 0 refills | Status: AC | PRN

## 2024-12-10 NOTE — Telephone Encounter (Signed)
" °  PDMP was reviewed Hydrocodone  e-scribed to pharmacy.  Ms Ohlendorf is aware via My- Chart  "

## 2025-01-08 ENCOUNTER — Encounter: Admitting: Registered Nurse

## 2025-03-05 ENCOUNTER — Ambulatory Visit: Admitting: Family Medicine

## 2025-09-23 ENCOUNTER — Ambulatory Visit
